# Patient Record
Sex: Male | Born: 1945
Health system: Southern US, Community
[De-identification: ages and names within clinical notes are randomized; demographics above are authoritative.]

## PROBLEM LIST (undated history)

## (undated) ENCOUNTER — Emergency Department (HOSPITAL_BASED_OUTPATIENT_CLINIC_OR_DEPARTMENT_OTHER): Admission: EM | Payer: Self-pay | Source: Home / Self Care

## (undated) DIAGNOSIS — N419 Inflammatory disease of prostate, unspecified: Secondary | ICD-10-CM

## (undated) DIAGNOSIS — C61 Malignant neoplasm of prostate: Secondary | ICD-10-CM

## (undated) DIAGNOSIS — Z934 Other artificial openings of gastrointestinal tract status: Secondary | ICD-10-CM

## (undated) DIAGNOSIS — E119 Type 2 diabetes mellitus without complications: Secondary | ICD-10-CM

## (undated) DIAGNOSIS — R251 Tremor, unspecified: Secondary | ICD-10-CM

## (undated) DIAGNOSIS — J189 Pneumonia, unspecified organism: Secondary | ICD-10-CM

## (undated) DIAGNOSIS — Z87442 Personal history of urinary calculi: Secondary | ICD-10-CM

## (undated) DIAGNOSIS — E859 Amyloidosis, unspecified: Secondary | ICD-10-CM

## (undated) DIAGNOSIS — H9190 Unspecified hearing loss, unspecified ear: Secondary | ICD-10-CM

## (undated) DIAGNOSIS — N200 Calculus of kidney: Secondary | ICD-10-CM

## (undated) DIAGNOSIS — K219 Gastro-esophageal reflux disease without esophagitis: Secondary | ICD-10-CM

## (undated) DIAGNOSIS — C4490 Unspecified malignant neoplasm of skin, unspecified: Secondary | ICD-10-CM

## (undated) DIAGNOSIS — E785 Hyperlipidemia, unspecified: Secondary | ICD-10-CM

## (undated) DIAGNOSIS — K8689 Other specified diseases of pancreas: Secondary | ICD-10-CM

## (undated) DIAGNOSIS — C9000 Multiple myeloma not having achieved remission: Secondary | ICD-10-CM

## (undated) HISTORY — PX: APPENDECTOMY: SHX54

## (undated) HISTORY — DX: Amyloidosis, unspecified: E85.9

## (undated) HISTORY — DX: Unspecified hearing loss, unspecified ear: H91.90

## (undated) HISTORY — DX: Other specified diseases of pancreas: K86.89

## (undated) HISTORY — DX: Calculus of kidney: N20.0

## (undated) HISTORY — PX: ROTATOR CUFF REPAIR: SHX139

## (undated) HISTORY — DX: Hyperlipidemia, unspecified: E78.5

## (undated) HISTORY — DX: Inflammatory disease of prostate, unspecified: N41.9

## (undated) HISTORY — DX: Multiple myeloma not having achieved remission: C90.00

## (undated) HISTORY — PX: HERNIA REPAIR: SHX51

---

## 2000-04-07 ENCOUNTER — Ambulatory Visit (HOSPITAL_COMMUNITY): Admission: RE | Admit: 2000-04-07 | Discharge: 2000-04-07 | Payer: Self-pay | Admitting: *Deleted

## 2000-04-07 HISTORY — PX: COLONOSCOPY: SHX174

## 2001-03-15 HISTORY — PX: ELBOW SURGERY: SHX618

## 2001-03-30 ENCOUNTER — Encounter (INDEPENDENT_AMBULATORY_CARE_PROVIDER_SITE_OTHER): Payer: Self-pay | Admitting: *Deleted

## 2001-03-30 ENCOUNTER — Ambulatory Visit (HOSPITAL_BASED_OUTPATIENT_CLINIC_OR_DEPARTMENT_OTHER): Admission: RE | Admit: 2001-03-30 | Discharge: 2001-03-30 | Payer: Self-pay | Admitting: Orthopedic Surgery

## 2003-07-13 ENCOUNTER — Emergency Department (HOSPITAL_COMMUNITY): Admission: EM | Admit: 2003-07-13 | Discharge: 2003-07-13 | Payer: Self-pay

## 2003-07-15 ENCOUNTER — Observation Stay (HOSPITAL_COMMUNITY): Admission: RE | Admit: 2003-07-15 | Discharge: 2003-07-16 | Payer: Self-pay | Admitting: Urology

## 2008-01-03 ENCOUNTER — Encounter: Admission: RE | Admit: 2008-01-03 | Discharge: 2008-01-03 | Payer: Self-pay | Admitting: General Surgery

## 2010-02-27 ENCOUNTER — Ambulatory Visit: Payer: Self-pay | Admitting: Cardiology

## 2010-02-27 ENCOUNTER — Encounter
Admission: RE | Admit: 2010-02-27 | Discharge: 2010-02-27 | Payer: Self-pay | Source: Home / Self Care | Attending: Cardiology | Admitting: Cardiology

## 2010-07-17 ENCOUNTER — Other Ambulatory Visit: Payer: Self-pay | Admitting: *Deleted

## 2010-07-17 MED ORDER — ZOLPIDEM TARTRATE 5 MG PO TABS: ORAL_TABLET | ORAL | Status: DC

## 2010-07-17 NOTE — Telephone Encounter (Signed)
Refilled meds per fax request. Faxed back to pharmacy  

## 2010-07-31 NOTE — Op Note (Signed)
NAME:  SPENSER, HARREN                     ACCOUNT NO.:  [id number]   MEDICAL RECORD NO.:  [id number]                   PATIENT TYPE:  OBV   LOCATION:  0358                                 FACILITY:  [facility]   PHYSICIAN:  Excell Seltzer. Annabell Howells, M.D.                 DATE OF BIRTH:  09/21/1945   DATE OF PROCEDURE:  07/15/2003  DATE OF DISCHARGE:                                 OPERATIVE REPORT   PROCEDURE:  Left ureteroscopic stent extraction with insertion of left  double-J stent.   PREOPERATIVE DIAGNOSIS:  Left ureterovesical junction stone.   POSTOPERATIVE DIAGNOSIS:  Left ureterovesical junction stone.   SURGEON:  Bjorn Pippin, M.D.   ANESTHESIA:  General anesthesia.   DRAINS:  A 6-French with 24-cm double-J stent.   SPECIMENS:  Stone.   COMPLICATIONS:  None.   INDICATIONS FOR PROCEDURE:  Mr. Meuth is a 65 year old white male who  presented to the ER for the second time this weekend with left flank pain  secondary to a 4-mm left distal ureteral stone.  After discussing the  options with the patient, it was elected to proceed with immediate  ureteroscopy.  The risks were explained in detail, including bleeding,  infection, and injury to the ureter, with possible open surgery, the need  for stent, as well as anesthetic complications.   FINDINGS AND PROCEDURE:  The patient was taken to the operating room where  general anesthetic was induced.  He was placed in the lithotomy position.  His perineum and genitalia were prepped with Betadine solution.  He was  draped in the usual sterile fashion.  A 6-French straight ureteroscope was  passed per urethra.  Examination revealed a normal urethra and intact  external sphincter.  The prostate appeared to have bilobar hyperplasia with  mild obstruction.  Examination of the bladder revealed trabeculation.  The  right ureteral orifice was unremarkable.  The left ureteral orifice was  identified and was unremarkable.  It was cannulated with  a standard  guidewire and the scope was advanced alongside the guidewire.  I was able to  advance the ureteroscope approximately 2 cm up the guidewire but met an  inflammatory-appearing stricture, likely secondary to the stone.  At this  point, the ureteroscope was removed.  A 4-cm 15-French balloon dilation  catheter was inserted over the wire across the area of stricturing.  The  balloon was dilated.  Initially, a waist did appear, but it was dilated by  12 atmospheres of pressure on the balloon.  The balloon was then removed,  leaving the wire in place.  The ureteroscope was then inserted alongside the  wire.  The stone was identified and grasped with a 3-French Nitinol basked  and removed without difficulty.  A 22-French cystoscope sheath was inserted  over the wire.  This was fitted with a 12-degree lens and bridge.  A 6-  French 24-cm double-J stent was then inserted to the  kidney under  fluoroscopic guidance without complications.  The wire was removed, leaving  good coil in the kidney and good coil in the bladder.  The bladder was then  more thoroughly inspected with a 70-degree lens and no bladder wall lesions  were identified.  The bladder was drained.  The  cystoscope was removed, leaving the stent string exiting the penis.  The  patient was taken down from the lithotomy position.  His anesthetic was  reversed and he was moved to the recovery room in stable condition.  He was  given Cipro 400 mg intravenously to cover the procedure.  There were no  complications.                                               Excell Seltzer. Annabell Howells, M.D.    JJW/MEDQ  D:  07/15/2003  T:  07/15/2003  Job:  [id number]

## 2010-07-31 NOTE — H&P (Signed)
NAME:  William Barron, William Barron                     ACCOUNT NO.:  [id number]   MEDICAL RECORD NO.:  [id number]                   PATIENT TYPE:  OBV   LOCATION:  0358                                 FACILITY:  [facility]   PHYSICIAN:  Excell Seltzer. Annabell Howells, M.D.                 DATE OF BIRTH:  11/24/1945   DATE OF ADMISSION:  07/15/2003  DATE OF DISCHARGE:                                HISTORY & PHYSICAL   CHIEF COMPLAINT:  Left flank pain.   HISTORY:  Mr. William Barron is a 65 year old white male who had the onset  Saturday morning of left flank pain.  The pain was severe enough that he  went to the emergency room where he was given IV analgesics.  A CT scan  revealed a 4 mm distal ureteral stone.  His pain abated, he went home, and  celebrated his birthday with a party at his house, and later that night  began to have recurrence of the left flank pain which once again became  severe enough to bring him to the emergency room.  He is concerned about  recurrent pain, and after reviewing the CT scan, I felt that ureteroscopy  was indicated.  He was Care Linked from [facility] to [facility] for evaluation and treatment.   ALLERGIES:  PENICILLIN.   CURRENT MEDICATIONS:  1. Dilaudid for acute stomach pain.  2. Aspirin p.r.n.   PAST MEDICAL HISTORY:  Unremarkable.   PAST SURGICAL HISTORY:  Pertinent for a prior orchiectomy for an undescended  testicle on the right as a child, and elbow surgery on the right from a  tennis injury.   SOCIAL HISTORY:  Negative tobacco, but he does drink moderate alcohol.   FAMILY HISTORY:  Unremarkable.   REVIEW OF SYSTEMS:  He denies fever or chills.  He has had some nausea.  He  also has had some irritative voiding symptoms.  He denies hematuria.  He is  otherwise entirely without complaints with the exception of his pain.   PHYSICAL EXAMINATION:  VITAL SIGNS:  His blood pressure is 156/91, pulse 75,  respirations 20, temperature 99.4.  GENERAL:  This  is a well-developed, well-nourished white male in no acute  distress, though medicated.  Alert and oriented x3.  HEENT:  Normocephalic, atraumatic.  NECK:  Supple.  LUNGS:  Clear with normal effort.  HEART:  Regular rate and rhythm.  ABDOMEN:  Soft, flat, with left ______________tenderness.  No mass,  hepatosplenomegaly, or hernias are noted.  There is no inguinal adenopathy.  GENITOURINARY:  Unremarkable phallus and adequate meatus.  Scrotum is  unremarkable.  The right testicle and epididymis are absent.  The left  testicle is normal in size without obvious mass or epididymal abnormalities.  RECTAL:  Not indicated.  EXTREMITIES:  Full range of motion without edema.  NEUROLOGIC:  Grossly intact.  SKIN:  Warm and dry.   LABORATORY DATA:  I reviewed  the laboratory work, including urinalysis,  which showed some blood.  CBC which demonstrates a mild elevation of the  white count and chemistries.  Additionally, I looked at the CT scan.  He has  a left distal ureteral stone with hydronephrosis.   IMPRESSION:  Symptomatic left UVJ stone.   PLAN:  After discussion of treatment options, we have elected to proceed  with ureteroscopic stone extraction.  The risks of bleeding, infection,  ureteral injury, need for a stent, and possible anesthetic complications  were explained to the patient.  He will be given Cipro to cover the  procedure.                                               Excell Seltzer. Annabell Howells, M.D.    JJW/MEDQ  D:  07/15/2003  T:  07/15/2003  Job:  [id number]

## 2010-07-31 NOTE — Op Note (Signed)
[place]  Patient:    William Barron, William Barron Visit Number: [id number] MRN: [id number]          Service Type: DSU Location: [facility] Attending Physician:  Burnard Bunting Dictated by:   Cammy Copa, M.D. Proc. Date: 03/30/01 Admit Date:  03/30/2001                             Operative Report  PREOPERATIVE DIAGNOSIS:  Right tennis elbow.  POSTOPERATIVE DIAGNOSIS:  Right tennis elbow.  PROCEDURE:  Right tennis elbow release with partial epicondylectomy.  SURGEON:  Cammy Copa, M.D.  ANESTHESIA:  General endotracheal.  ESTIMATED BLOOD LOSS:  2 cc.  DRAINS:  None.  TOURNIQUET TIME:  54 minutes at 250 mmHg.  DESCRIPTION OF PROCEDURE:  The patient was brought to the operating room, where general endotracheal anesthesia was induced.  Preoperative IV antibiotics were administered.  The right elbow, arm, and hand were prepped with Duraprep solution and draped in a sterile manner.  [name] was used to cover the operative field.  The arm was elevated and exsanguinated with the Esmarch wrap, the tourniquet was inflated.  A 4 cm incision was made centered off of the anterior aspect of the epicondyle.  Skin and subcutaneous tissue were sharply divided.  Fascia overlying the common extensor origin was divided and developed as a layer for separate closure.  The common extensor origin was divided over the ECRB.  Using skin hook retractors, an area of degenerated tissue was identified.  This was carefully separated from the underlying capsule and overlying normal tendinous tissue.  All in all, a 5 mm x 1 cm strip of degenerative tendinous tissue was removed.  The lateral epicondyle was exposed in the region of this degenerative tissue.  Using an osteotome, the bone was feathered to create some bleeding.  This was touched up with several drill holes using the small drill bit.  The incision was thoroughly irrigated.  The joint capsule was not  violated.  The common extensor origin was then repaired using 3-0 Vicryl suture.  The fascia overlying the common extensor origin was also closed using 3-0 Vicryl suture.  Skin was closed using interrupted 3-0 Vicryl suture followed by a running 3-0 pull-out Prolene.  The patients tourniquet was released after 54 minutes.  The patient tolerated the procedure well without immediate complication.  He was placed into a short-arm splint and a sling. Dictated by:   Cammy Copa, M.D. Attending Physician:  Burnard Bunting DD:  03/30/01 TD:  03/31/01 Job: [id number]

## 2010-09-10 ENCOUNTER — Encounter: Payer: Self-pay | Admitting: Cardiology

## 2010-09-21 ENCOUNTER — Encounter: Payer: Self-pay | Admitting: Cardiology

## 2010-09-21 ENCOUNTER — Ambulatory Visit (INDEPENDENT_AMBULATORY_CARE_PROVIDER_SITE_OTHER): Payer: Medicare Other | Admitting: Cardiology

## 2010-09-21 DIAGNOSIS — R413 Other amnesia: Secondary | ICD-10-CM

## 2010-09-21 DIAGNOSIS — R0989 Other specified symptoms and signs involving the circulatory and respiratory systems: Secondary | ICD-10-CM | POA: Insufficient documentation

## 2010-09-21 DIAGNOSIS — I10 Essential (primary) hypertension: Secondary | ICD-10-CM

## 2010-09-21 NOTE — Assessment & Plan Note (Signed)
The patient complains of difficulty with his memory.  He has had a very stressful year with the loss of his son occurring 13 months ago.  The patient feels that his memory is declining and he would like to have an evaluation of this.  He is a Environmental education officer.

## 2010-09-21 NOTE — Assessment & Plan Note (Signed)
This pleasant 65 year old man has a history of labile hypertension.  Number last saw him on 02/27/10 his blood pressure was 148/80.  We did not start him on medication but advised him to cut back on salt intake which she has done.  Previously he had been eating a lot of salted peanuts each night.  He has been able to lose weight.  He is not exercising on a regular basis yet.

## 2010-09-21 NOTE — Progress Notes (Signed)
William Barron Date of Birth:  15-Jun-1945 [facility] [address]   [address] [place], [place]  [address] [phone]           Fax   [phone]  HPI: This pleasant 65 year old gentleman is seen for a six-month followup office visit.  He has a history of labile hypertension.  He is not on any present medication.  His blood pressure has improved since he cut back on dietary salt intake.  He has a history of low testosterone and is taking AndroGel on a regular basis.  He does not have any history of known ischemic heart disease.  He had a normal nuclear stress test 10/04/06.  Recently he's been concerned about her decline in his memory.  He does acknowledge that the past year has been a very stressful year since his son died just 13 months ago  Current Outpatient Prescriptions  Medication Sig Dispense Refill  . fluconazole (DIFLUCAN) 200 MG tablet Take 200 mg by mouth daily. Take as directed       . Tamsulosin HCl (FLOMAX) 0.4 MG CAPS Take 0.4 mg by mouth daily.        . Testosterone (ANDROGEL PUMP TD) Place onto the skin. daily       . zolpidem (AMBIEN) 5 MG tablet 1 to 2 at bedtime as needed  60 tablet  5  . DISCONTD: aspirin 81 MG tablet Take 81 mg by mouth daily.          Allergies  Allergen Reactions  . Penicillins Swelling and Rash    Patient Active Problem List  Diagnoses  . Memory disorder  . Labile hypertension    History  Smoking status  . Former Smoker  . Quit date: 03/15/1977  Smokeless tobacco  . Not on file    History  Alcohol Use No    Family History  Problem Relation Age of Onset  . Cancer Father     pancreatic  . Parkinsonism Mother     Review of Systems: The patient denies any heat or cold intolerance.  No weight gain or weight loss.  The patient denies headaches or blurry vision.  There is no cough or sputum production.  The patient denies dizziness.  There is no hematuria or hematochezia.  The patient denies any  muscle aches or arthritis.  The patient denies any rash.  The patient denies frequent falling or instability.  There is no history of depression or anxiety.  All other systems were reviewed and are negative.   Physical Exam: Filed Vitals:   09/21/10 1417  BP: 128/76  The general appearance feels a well-developed well-nourished gentleman in no distress.The head and neck exam reveals pupils equal and reactive.  Extraocular movements are full.  There is no scleral icterus.  The mouth and pharynx are normal.  The neck is supple.  The carotids reveal no bruits.  The jugular venous pressure is normal.  The  thyroid is not enlarged.  There is no lymphadenopathy.  The chest is clear to percussion and auscultation.  There are no rales or rhonchi.  Expansion of the chest is symmetrical.  The precordium is quiet.  The first heart sound is normal.  The second heart sound is physiologically split.  There is no murmur gallop rub or click.  There is no abnormal lift or heave.  The abdomen is soft and nontender.  The bowel sounds are normal.  The liver and spleen are not enlarged.  There are no abdominal masses.  There are no abdominal bruits.  Extremities reveal good pedal pulses.  There is no phlebitis or edema.  There is no cyanosis or clubbing.  Strength is normal and symmetrical in all extremities.  There is no lateralizing weakness.  There are no sensory deficits.  The skin is warm and dry.  There is no rash.    Assessment / Plan: The patient's blood pressure has normalized with dietary salt restriction.  He does need to get back to regular aerobic exercise program and to continue to lose weight.  He had stopped taking his baby aspirin daily and I wanted to restart that.  We will arrange for neurology evaluation for questionable diminishing memory with [facility] neurology.Recheck here in 6 months for followup office visit and fasting lab work

## 2011-03-22 DIAGNOSIS — E291 Testicular hypofunction: Secondary | ICD-10-CM | POA: Diagnosis not present

## 2011-03-22 DIAGNOSIS — N400 Enlarged prostate without lower urinary tract symptoms: Secondary | ICD-10-CM | POA: Diagnosis not present

## 2011-03-22 DIAGNOSIS — N529 Male erectile dysfunction, unspecified: Secondary | ICD-10-CM | POA: Diagnosis not present

## 2011-03-29 ENCOUNTER — Encounter: Payer: Self-pay | Admitting: Cardiology

## 2011-03-29 ENCOUNTER — Ambulatory Visit (INDEPENDENT_AMBULATORY_CARE_PROVIDER_SITE_OTHER): Payer: Medicare Other | Admitting: Cardiology

## 2011-03-29 VITALS — BP 126/82 | HR 66 | Ht 66.0 in | Wt 164.0 lb

## 2011-03-29 DIAGNOSIS — E7800 Pure hypercholesterolemia, unspecified: Secondary | ICD-10-CM

## 2011-03-29 DIAGNOSIS — I10 Essential (primary) hypertension: Secondary | ICD-10-CM

## 2011-03-29 DIAGNOSIS — R0989 Other specified symptoms and signs involving the circulatory and respiratory systems: Secondary | ICD-10-CM

## 2011-03-29 LAB — BASIC METABOLIC PANEL
BUN: 17 mg/dL (ref 6–23)
CO2: 26 mEq/L (ref 19–32)
Calcium: 8.9 mg/dL (ref 8.4–10.5)
Chloride: 101 mEq/L (ref 96–112)
Creatinine, Ser: 1 mg/dL (ref 0.4–1.5)
GFR: 77.26 mL/min (ref >60.00)
Glucose, Bld: 91 mg/dL (ref 70–99)
Potassium: 3.6 mEq/L (ref 3.5–5.1)
Sodium: 141 mEq/L (ref 135–145)

## 2011-03-29 LAB — HEPATIC FUNCTION PANEL
ALT: 16 U/L (ref 0–53)
AST: 22 U/L (ref 0–37)
Albumin: 4 g/dL (ref 3.5–5.2)
Alkaline Phosphatase: 70 U/L (ref 39–117)
Bilirubin, Direct: 0.1 mg/dL (ref 0.0–0.3)
Total Bilirubin: 1 mg/dL (ref 0.3–1.2)
Total Protein: 7.2 g/dL (ref 6.0–8.3)

## 2011-03-29 LAB — LIPID PANEL
Cholesterol: 189 mg/dL (ref 0–200)
HDL: 45.3 mg/dL (ref >39.00)
LDL Cholesterol: 126 mg/dL — ABNORMAL HIGH (ref 0–99)
Total CHOL/HDL Ratio: 4
Triglycerides: 87 mg/dL (ref 0.0–149.0)
VLDL: 17.4 mg/dL (ref 0.0–40.0)

## 2011-03-29 NOTE — Assessment & Plan Note (Signed)
The patient denies any chest pain or shortness of breath.  He does get moderate exercise.  He does play occasional round of golf.  He walks in the neighborhood.  He intends to get started on a more vigorous exercise program.

## 2011-03-29 NOTE — Progress Notes (Signed)
William Barron Date of Birth:  [birth year] [facility] [address] [place], [place]  [address] [phone]  Fax   [phone]  HPI: This pleasant 66 year old gentleman is seen for a six-month followup office visit.  He's had a past history of labile hypertension.  He is watching his dietary salt.  He is not presently on any blood pressure medication.  He has a past history of high cholesterol.  He does not have ischemic heart disease and he had a normal nuclear stress test in 10/04/06.  He recently had a melanoma stage I removed from his back by Dr. Irene Limbo.  Current Outpatient Prescriptions  Medication Sig Dispense Refill  . fluconazole (DIFLUCAN) 200 MG tablet Take 200 mg by mouth daily. Take as directed      . Tamsulosin HCl (FLOMAX) 0.4 MG CAPS Take 0.4 mg by mouth daily.        . Testosterone (ANDROGEL PUMP TD) Place onto the skin. daily       . zolpidem (AMBIEN) 5 MG tablet 1 to 2 at bedtime as needed  60 tablet  5    Allergies  Allergen Reactions  . Penicillins Swelling and Rash    Patient Active Problem List  Diagnoses  . Memory disorder  . Labile hypertension  . Pure hypercholesterolemia    History  Smoking status  . Former Smoker  . Quit date: 03/15/1977  Smokeless tobacco  . Not on file    History  Alcohol Use No    Family History  Problem Relation Age of Onset  . Cancer Father     pancreatic  . Parkinsonism Mother     Review of Systems: The patient denies any heat or cold intolerance.  No weight gain or weight loss.  The patient denies headaches or blurry vision.  There is no cough or sputum production.  The patient denies dizziness.  There is no hematuria or hematochezia.  The patient denies any muscle aches or arthritis.  The patient denies any rash.  The patient denies frequent falling or instability.  There is no history of depression or anxiety.  All other systems were reviewed and are negative.   Physical  Exam: Filed Vitals:   03/29/11 1406  BP: 126/82  Pulse: 66   the general appearance reveals a well-developed well-nourished gentleman in no distress.The head and neck exam reveals pupils equal and reactive.  Extraocular movements are full.  There is no scleral icterus.  The mouth and pharynx are normal.  The neck is supple.  The carotids reveal no bruits.  The jugular venous pressure is normal.  The  thyroid is not enlarged.  There is no lymphadenopathy.  The chest is clear to percussion and auscultation.  There are no rales or rhonchi.  Expansion of the chest is symmetrical.  The precordium is quiet.  The first heart sound is normal.  The second heart sound is physiologically split.  There is no murmur gallop rub or click.  There is no abnormal lift or heave.  The abdomen is soft and nontender.  The bowel sounds are normal.  The liver and spleen are not enlarged.  There are no abdominal masses.  There are no abdominal bruits.  Extremities reveal good pedal pulses.  There is no phlebitis or edema.  There is no cyanosis or clubbing.  Strength is normal and symmetrical in all extremities.  There is no lateralizing weakness.  There are no sensory deficits.  The skin is warm and  dry.  There is no rash.      Assessment / Plan:  Continue present medication.  Blood work being drawn today.  Recheck in 6 months for office visit and EKG and fasting lab work.  Work harder on regular aerobic exercise program.

## 2011-03-29 NOTE — Assessment & Plan Note (Signed)
The patient has gained 2 pounds since last visit.  History and watch his diet in terms of cholesterol intake.  We are checking blood work today.

## 2011-03-29 NOTE — Patient Instructions (Signed)
Will obtain labs today and call you with the results (lp/bmet/hfp) Your physician recommends that you continue on your current medications as directed. Please refer to the Current Medication list given to you today. Your physician wants you to follow-up in: 6 months You will receive a reminder letter in the mail two months in advance. If you don't receive a letter, please call our office to schedule the follow-up appointment.     

## 2011-03-31 ENCOUNTER — Telehealth: Payer: Self-pay | Admitting: Cardiology

## 2011-03-31 ENCOUNTER — Telehealth: Payer: Self-pay | Admitting: *Deleted

## 2011-03-31 NOTE — Telephone Encounter (Signed)
Message copied by Burnell Blanks on Wed Mar 31, 2011  5:38 PM ------      Message from: Cassell Clement      Created: Mon Mar 29, 2011  7:33 PM       Please report.  The cholesterol is 189 which is okay but the LDL cholesterol is high at 126.  The liver and kidney tests are normal.  Work harder on careful low-cholesterol diet and more regular aerobic exercise and weight loss.

## 2011-03-31 NOTE — Telephone Encounter (Signed)
Called cell number, wrong number.  Called home number and left message mailing copy, call in any questions

## 2011-03-31 NOTE — Telephone Encounter (Signed)
Patient returned call and tried to call back.  Cell number wrong, left message at home.  Mailed copy to call if any questions

## 2011-03-31 NOTE — Telephone Encounter (Signed)
New Problem   Patient returning nurse MP phone call, please contact patient on mobile#

## 2011-06-15 DIAGNOSIS — H6690 Otitis media, unspecified, unspecified ear: Secondary | ICD-10-CM | POA: Diagnosis not present

## 2011-06-15 DIAGNOSIS — H65199 Other acute nonsuppurative otitis media, unspecified ear: Secondary | ICD-10-CM | POA: Diagnosis not present

## 2011-06-15 DIAGNOSIS — H6120 Impacted cerumen, unspecified ear: Secondary | ICD-10-CM | POA: Diagnosis not present

## 2011-06-15 DIAGNOSIS — R030 Elevated blood-pressure reading, without diagnosis of hypertension: Secondary | ICD-10-CM | POA: Diagnosis not present

## 2011-06-29 DIAGNOSIS — D485 Neoplasm of uncertain behavior of skin: Secondary | ICD-10-CM | POA: Diagnosis not present

## 2011-06-29 DIAGNOSIS — Z85820 Personal history of malignant melanoma of skin: Secondary | ICD-10-CM | POA: Diagnosis not present

## 2011-06-29 DIAGNOSIS — L851 Acquired keratosis [keratoderma] palmaris et plantaris: Secondary | ICD-10-CM | POA: Diagnosis not present

## 2011-06-29 DIAGNOSIS — L821 Other seborrheic keratosis: Secondary | ICD-10-CM | POA: Diagnosis not present

## 2011-06-29 DIAGNOSIS — D239 Other benign neoplasm of skin, unspecified: Secondary | ICD-10-CM | POA: Diagnosis not present

## 2011-07-09 DIAGNOSIS — H6120 Impacted cerumen, unspecified ear: Secondary | ICD-10-CM | POA: Diagnosis not present

## 2011-07-09 DIAGNOSIS — H903 Sensorineural hearing loss, bilateral: Secondary | ICD-10-CM | POA: Diagnosis not present

## 2011-09-22 DIAGNOSIS — E291 Testicular hypofunction: Secondary | ICD-10-CM | POA: Diagnosis not present

## 2011-09-22 DIAGNOSIS — N400 Enlarged prostate without lower urinary tract symptoms: Secondary | ICD-10-CM | POA: Diagnosis not present

## 2011-09-29 DIAGNOSIS — N400 Enlarged prostate without lower urinary tract symptoms: Secondary | ICD-10-CM | POA: Diagnosis not present

## 2011-09-29 DIAGNOSIS — N529 Male erectile dysfunction, unspecified: Secondary | ICD-10-CM | POA: Diagnosis not present

## 2011-09-29 DIAGNOSIS — E291 Testicular hypofunction: Secondary | ICD-10-CM | POA: Diagnosis not present

## 2011-09-29 DIAGNOSIS — N411 Chronic prostatitis: Secondary | ICD-10-CM | POA: Diagnosis not present

## 2011-10-07 ENCOUNTER — Encounter: Payer: Self-pay | Admitting: Cardiology

## 2011-11-10 DIAGNOSIS — N400 Enlarged prostate without lower urinary tract symptoms: Secondary | ICD-10-CM | POA: Diagnosis not present

## 2011-11-10 DIAGNOSIS — R351 Nocturia: Secondary | ICD-10-CM | POA: Diagnosis not present

## 2011-12-09 ENCOUNTER — Other Ambulatory Visit: Payer: Self-pay | Admitting: Cardiology

## 2011-12-10 DIAGNOSIS — M25579 Pain in unspecified ankle and joints of unspecified foot: Secondary | ICD-10-CM | POA: Diagnosis not present

## 2011-12-10 DIAGNOSIS — M65979 Unspecified synovitis and tenosynovitis, unspecified ankle and foot: Secondary | ICD-10-CM | POA: Diagnosis not present

## 2011-12-10 DIAGNOSIS — M659 Synovitis and tenosynovitis, unspecified: Secondary | ICD-10-CM | POA: Diagnosis not present

## 2011-12-14 ENCOUNTER — Other Ambulatory Visit: Payer: Self-pay | Admitting: Cardiology

## 2011-12-14 DIAGNOSIS — G4700 Insomnia, unspecified: Secondary | ICD-10-CM

## 2011-12-14 MED ORDER — ZOLPIDEM TARTRATE 5 MG PO TABS: ORAL_TABLET | ORAL | Status: DC

## 2011-12-30 DIAGNOSIS — D1801 Hemangioma of skin and subcutaneous tissue: Secondary | ICD-10-CM | POA: Diagnosis not present

## 2011-12-30 DIAGNOSIS — L821 Other seborrheic keratosis: Secondary | ICD-10-CM | POA: Diagnosis not present

## 2011-12-30 DIAGNOSIS — Z85820 Personal history of malignant melanoma of skin: Secondary | ICD-10-CM | POA: Diagnosis not present

## 2011-12-30 DIAGNOSIS — L678 Other hair color and hair shaft abnormalities: Secondary | ICD-10-CM | POA: Diagnosis not present

## 2011-12-30 DIAGNOSIS — D239 Other benign neoplasm of skin, unspecified: Secondary | ICD-10-CM | POA: Diagnosis not present

## 2011-12-30 DIAGNOSIS — L738 Other specified follicular disorders: Secondary | ICD-10-CM | POA: Diagnosis not present

## 2012-03-22 DIAGNOSIS — Z23 Encounter for immunization: Secondary | ICD-10-CM | POA: Diagnosis not present

## 2012-07-03 DIAGNOSIS — N529 Male erectile dysfunction, unspecified: Secondary | ICD-10-CM | POA: Diagnosis not present

## 2012-07-03 DIAGNOSIS — N400 Enlarged prostate without lower urinary tract symptoms: Secondary | ICD-10-CM | POA: Diagnosis not present

## 2012-07-03 DIAGNOSIS — E291 Testicular hypofunction: Secondary | ICD-10-CM | POA: Diagnosis not present

## 2012-07-28 ENCOUNTER — Ambulatory Visit (INDEPENDENT_AMBULATORY_CARE_PROVIDER_SITE_OTHER): Payer: Medicare Other | Admitting: Emergency Medicine

## 2012-07-28 ENCOUNTER — Ambulatory Visit: Payer: Medicare Other

## 2012-07-28 VITALS — BP 104/60 | HR 86 | Temp 98.4°F | Resp 16 | Ht 64.0 in | Wt 164.0 lb

## 2012-07-28 DIAGNOSIS — R05 Cough: Secondary | ICD-10-CM | POA: Diagnosis not present

## 2012-07-28 DIAGNOSIS — J309 Allergic rhinitis, unspecified: Secondary | ICD-10-CM

## 2012-07-28 DIAGNOSIS — R059 Cough, unspecified: Secondary | ICD-10-CM

## 2012-07-28 DIAGNOSIS — R51 Headache: Secondary | ICD-10-CM | POA: Diagnosis not present

## 2012-07-28 DIAGNOSIS — J189 Pneumonia, unspecified organism: Secondary | ICD-10-CM

## 2012-07-28 LAB — POCT CBC
Granulocyte percent: 74.4 %G (ref 37–80)
HCT, POC: 48.9 % (ref 43.5–53.7)
Hemoglobin: 15.5 g/dL (ref 14.1–18.1)
Lymph, poc: 2 (ref 0.6–3.4)
MCH, POC: 31.3 pg — AB (ref 27–31.2)
MCHC: 31.7 g/dL — AB (ref 31.8–35.4)
MCV: 98.6 fL — AB (ref 80–97)
MID (cbc): 0.6 (ref 0–0.9)
MPV: 8.8 fL (ref 0–99.8)
POC Granulocyte: 7.4 — AB (ref 2–6.9)
POC LYMPH PERCENT: 19.7 %L (ref 10–50)
POC MID %: 5.9 %M (ref 0–12)
Platelet Count, POC: 295 10*3/uL (ref 142–424)
RBC: 4.96 M/uL (ref 4.69–6.13)
RDW, POC: 15.3 %
WBC: 9.9 10*3/uL (ref 4.6–10.2)

## 2012-07-28 MED ORDER — LEVOFLOXACIN 500 MG PO TABS: 500.0000 mg | ORAL_TABLET | Freq: Every day | ORAL | Status: AC

## 2012-07-28 MED ORDER — FLUTICASONE PROPIONATE 50 MCG/ACT NA SUSP: 2.0000 | Freq: Every day | NASAL | Status: DC

## 2012-07-28 NOTE — Progress Notes (Signed)
  Subjective:    Patient ID: William Barron, male    DOB: 1945-08-08, 67 y.o.   MRN: [id number]  HPI [name] with 3 week history of head congestion sinus congestion cough. Patient is a former smoker but stopped smoking about 30 years ago. He has not been coughing out much phlegm..    Review of Systems     Objective:   Physical Exam TMs are clear nose is congested throat is normal chest exam reveals a few rhonchi in the bases. Results for orders placed in visit on 07/28/12  POCT CBC      Result Value Range   WBC 9.9  4.6 - 10.2 K/uL   Lymph, poc 2.0  0.6 - 3.4   POC LYMPH PERCENT 19.7  10 - 50 %L   MID (cbc) 0.6  0 - 0.9   POC MID % 5.9  0 - 12 %M   POC Granulocyte 7.4 (*) 2 - 6.9   Granulocyte percent 74.4  37 - 80 %G   RBC 4.96  4.69 - 6.13 M/uL   Hemoglobin 15.5  14.1 - 18.1 g/dL   HCT, POC 40.9  81.1 - 53.7 %   MCV 98.6 (*) 80 - 97 fL   MCH, POC 31.3 (*) 27 - 31.2 pg   MCHC 31.7 (*) 31.8 - 35.4 g/dL   RDW, POC 91.4     Platelet Count, POC 295  142 - 424 K/uL   MPV 8.8  0 - 99.8 fL   [facility] reading (PRIMARY) by  Dr. Cleta Alberts  There appears tobe a patchy infiltrate over the left diaphragm       Assessment & Plan:   Will treat with Levaquin , mucinex,  and Flonase. Repeat chest x-ray in 2 weeks

## 2012-07-28 NOTE — Patient Instructions (Signed)
Please return to clinic in 2 weeks for repeat chest x-ray. Take antibiotics as instructed.pneumoniaPneumonia, Adult Pneumonia is an infection of the lungs.  CAUSES Pneumonia may be caused by bacteria or a virus. Usually, these infections are caused by breathing infectious particles into the lungs (respiratory tract). SYMPTOMS   Cough.  Fever.  Chest pain.  Increased rate of breathing.  Wheezing.  Mucus production. DIAGNOSIS  If you have the common symptoms of pneumonia, your caregiver will typically confirm the diagnosis with a chest X-ray. The X-ray will show an abnormality in the lung (pulmonary infiltrate) if you have pneumonia. Other tests of your blood, urine, or sputum may be done to find the specific cause of your pneumonia. Your caregiver may also do tests (blood gases or pulse oximetry) to see how well your lungs are working. TREATMENT  Some forms of pneumonia may be spread to other people when you cough or sneeze. You may be asked to wear a mask before and during your exam. Pneumonia that is caused by bacteria is treated with antibiotic medicine. Pneumonia that is caused by the influenza virus may be treated with an antiviral medicine. Most other viral infections must run their course. These infections will not respond to antibiotics.  PREVENTION A pneumococcal shot (vaccine) is available to prevent a common bacterial cause of pneumonia. This is usually suggested for:  People over 79 years old.  Patients on chemotherapy.  People with chronic lung problems, such as bronchitis or emphysema.  People with immune system problems. If you are over 65 or have a high risk condition, you may receive the pneumococcal vaccine if you have not received it before. In some countries, a routine influenza vaccine is also recommended. This vaccine can help prevent some cases of pneumonia.You may be offered the influenza vaccine as part of your care. If you smoke, it is time to quit. You may  receive instructions on how to stop smoking. Your caregiver can provide medicines and counseling to help you quit. HOME CARE INSTRUCTIONS   Cough suppressants may be used if you are losing too much rest. However, coughing protects you by clearing your lungs. You should avoid using cough suppressants if you can.  Your caregiver may have prescribed medicine if he or she thinks your pneumonia is caused by a bacteria or influenza. Finish your medicine even if you start to feel better.  Your caregiver may also prescribe an expectorant. This loosens the mucus to be coughed up.  Only take over-the-counter or prescription medicines for pain, discomfort, or fever as directed by your caregiver.  Do not smoke. Smoking is a common cause of bronchitis and can contribute to pneumonia. If you are a smoker and continue to smoke, your cough may last several weeks after your pneumonia has cleared.  A cold steam vaporizer or humidifier in your room or home may help loosen mucus.  Coughing is often worse at night. Sleeping in a semi-upright position in a recliner or using a couple pillows under your head will help with this.  Get rest as you feel it is needed. Your body will usually let you know when you need to rest. SEEK IMMEDIATE MEDICAL CARE IF:   Your illness becomes worse. This is especially true if you are elderly or weakened from any other disease.  You cannot control your cough with suppressants and are losing sleep.  You begin coughing up blood.  You develop pain which is getting worse or is uncontrolled with medicines.  You have  a fever.  Any of the symptoms which initially brought you in for treatment are getting worse rather than better.  You develop shortness of breath or chest pain. MAKE SURE YOU:   Understand these instructions.  Will watch your condition.  Will get help right away if you are not doing well or get worse. Document Released: 03/01/2005 Document Revised: 05/24/2011  Document Reviewed: 05/21/2010 [facility] Patient Information 2013 [place], [place].

## 2012-08-10 ENCOUNTER — Ambulatory Visit: Payer: Medicare Other

## 2012-08-10 ENCOUNTER — Ambulatory Visit (INDEPENDENT_AMBULATORY_CARE_PROVIDER_SITE_OTHER): Payer: Medicare Other | Admitting: Emergency Medicine

## 2012-08-10 VITALS — BP 118/80 | HR 82 | Temp 97.9°F | Resp 18 | Ht 65.5 in | Wt 163.0 lb

## 2012-08-10 DIAGNOSIS — J189 Pneumonia, unspecified organism: Secondary | ICD-10-CM | POA: Diagnosis not present

## 2012-08-10 NOTE — Progress Notes (Signed)
  Subjective:    Patient ID: William Barron, male    DOB: 03/12/1946, 67 y.o.   MRN: [id number]  HPI Pt presents to clinic today for a follow up on his pnuemonia. Pt states he has not been coughing in about 3-4 days his chest x-ray showed a left lateral lung infiltrate versus atelectasis. He has completed his course of Levaquin..   Review of Systems     Objective:   Physical Exam HEENT exam is unremarkable. Neck is supple. Chest is clear. Heart regular rate no murmurs.  [facility] reading (PRIMARY) by  Dr. Cleta Alberts infiltrate left base is 90% resolved        Assessment & Plan:  Patient has a left lower lobe pneumonia which has resolved almost 90%. Would do one followup film in a month. He has not smoked for over 30 years

## 2012-08-28 ENCOUNTER — Encounter: Payer: Self-pay | Admitting: Radiology

## 2012-09-20 DIAGNOSIS — H903 Sensorineural hearing loss, bilateral: Secondary | ICD-10-CM | POA: Diagnosis not present

## 2012-09-20 DIAGNOSIS — H905 Unspecified sensorineural hearing loss: Secondary | ICD-10-CM | POA: Diagnosis not present

## 2012-12-28 DIAGNOSIS — D1801 Hemangioma of skin and subcutaneous tissue: Secondary | ICD-10-CM | POA: Diagnosis not present

## 2012-12-28 DIAGNOSIS — L819 Disorder of pigmentation, unspecified: Secondary | ICD-10-CM | POA: Diagnosis not present

## 2012-12-28 DIAGNOSIS — L821 Other seborrheic keratosis: Secondary | ICD-10-CM | POA: Diagnosis not present

## 2012-12-28 DIAGNOSIS — Z85820 Personal history of malignant melanoma of skin: Secondary | ICD-10-CM | POA: Diagnosis not present

## 2013-01-01 DIAGNOSIS — N400 Enlarged prostate without lower urinary tract symptoms: Secondary | ICD-10-CM | POA: Diagnosis not present

## 2013-01-01 DIAGNOSIS — E291 Testicular hypofunction: Secondary | ICD-10-CM | POA: Diagnosis not present

## 2013-01-08 DIAGNOSIS — K5520 Angiodysplasia of colon without hemorrhage: Secondary | ICD-10-CM | POA: Diagnosis not present

## 2013-01-08 DIAGNOSIS — K648 Other hemorrhoids: Secondary | ICD-10-CM | POA: Diagnosis not present

## 2013-01-08 DIAGNOSIS — D126 Benign neoplasm of colon, unspecified: Secondary | ICD-10-CM | POA: Diagnosis not present

## 2013-01-08 DIAGNOSIS — K620 Anal polyp: Secondary | ICD-10-CM | POA: Diagnosis not present

## 2013-01-08 DIAGNOSIS — Z86010 Personal history of colonic polyps: Secondary | ICD-10-CM | POA: Diagnosis not present

## 2013-01-08 DIAGNOSIS — K5730 Diverticulosis of large intestine without perforation or abscess without bleeding: Secondary | ICD-10-CM | POA: Diagnosis not present

## 2013-01-08 DIAGNOSIS — Z09 Encounter for follow-up examination after completed treatment for conditions other than malignant neoplasm: Secondary | ICD-10-CM | POA: Diagnosis not present

## 2013-01-08 DIAGNOSIS — D128 Benign neoplasm of rectum: Secondary | ICD-10-CM | POA: Diagnosis not present

## 2013-01-22 ENCOUNTER — Encounter (INDEPENDENT_AMBULATORY_CARE_PROVIDER_SITE_OTHER): Payer: Self-pay | Admitting: General Surgery

## 2013-01-22 ENCOUNTER — Ambulatory Visit (INDEPENDENT_AMBULATORY_CARE_PROVIDER_SITE_OTHER): Payer: Medicare Other | Admitting: General Surgery

## 2013-01-22 VITALS — BP 138/76 | HR 68 | Temp 98.9°F | Resp 14 | Ht 66.0 in | Wt 161.6 lb

## 2013-01-22 DIAGNOSIS — K644 Residual hemorrhoidal skin tags: Secondary | ICD-10-CM

## 2013-01-22 NOTE — Progress Notes (Signed)
Chief Complaint  Patient presents with  . New Evaluation    eval anal nodule    HISTORY: MASATO PETTIE is a 67 y.o. male who presents to the office with an anal nodule found on recent colonscopy.   His bowel habits are regular and his bowel movements are sometimes hard.  He has occasional straining.  His fiber intake is dietary.  His last colonoscopy was 10/27.  Several hyperplastic polyps were found along with an anal mass.  He denies any pain or bleeding.  No change in bowel habits.     Past Medical History  Diagnosis Date  . Hyperlipidemia   . Dyspnea   . Prostatitis     acute and chronic  . Nephrolithiasis   . Hypertension     systolic      Past Surgical History  Procedure Laterality Date  . Cystoscopy with ureteroscopy    . Lithotomy    . Orchiectomy      right  . Rotator cuff repair    . Appendectomy    . Elbow surgery  2003    right  . Colonoscopy  04/07/00  . Hernia repair          Current Outpatient Prescriptions  Medication Sig Dispense Refill  . fluconazole (DIFLUCAN) 200 MG tablet       . sildenafil (VIAGRA) 25 MG tablet Take 25 mg by mouth daily as needed for erectile dysfunction.      . Tamsulosin HCl (FLOMAX) 0.4 MG CAPS Take 0.4 mg by mouth daily.        . Testosterone (ANDROGEL PUMP TD) Place onto the skin. daily       . zolpidem (AMBIEN) 5 MG tablet 1 to 2 at bedtime as needed  60 tablet  0   No current facility-administered medications for this visit.      Allergies  Allergen Reactions  . Penicillins Swelling and Rash      Family History  Problem Relation Age of Onset  . Cancer Father     pancreatic  . Pancreatic cancer Father   . Parkinsonism Mother     History   Social History  . Marital Status: Married    Spouse Name: N/A    Number of Children: N/A  . Years of Education: N/A   Social History Main Topics  . Smoking status: Former Smoker    Quit date: 03/15/1977  . Smokeless tobacco: Never Used  . Alcohol Use: Yes  . Drug  Use: No  . Sexual Activity: Yes    Birth Control/ Protection: None   Other Topics Concern  . None   Social History Narrative  . None      REVIEW OF SYSTEMS - PERTINENT POSITIVES ONLY: Review of Systems - General ROS: negative for - chills, fever or weight loss Hematological and Lymphatic ROS: negative for - bleeding problems, blood clots or bruising Respiratory ROS: no cough, shortness of breath, or wheezing Cardiovascular ROS: no chest pain or dyspnea on exertion Gastrointestinal ROS: no abdominal pain, change in bowel habits, or black or bloody stools Genito-Urinary ROS: no dysuria, trouble voiding, or hematuria  EXAM: Filed Vitals:   01/22/13 1536  BP: 138/76  Pulse: 68  Temp: 98.9 F (37.2 C)  Resp: 14    General appearance: alert and cooperative Resp: clear to auscultation bilaterally Cardio: regular rate and rhythm GI: normal findings: soft, non-tender   Procedure: Anoscopy Surgeon: Maisie Fus Diagnosis: anal nodule  Assistant: Christella Scheuermann After the risks and benefits  were explained, verbal consent was obtained for above procedure  Anesthesia: none Findings: grade 2 internal hemorrhoids, small skin tag noted on LL internal hemorrhoid.  Moderate RA skin tag    ASSESSMENT AND PLAN: SID GREENER is a 67 y.o. M with a recent finding of an anal mass on colonoscopy.  Upon further review with anoscopy, this appears to be a small skin tag.  This appears completely benign.  I suggested that we leave it unless he has difficulty with anal irritation and/or bleeding.      Vanita Panda, MD Colon and Rectal Surgery / General Surgery [facility]      Visit Diagnoses: 1. Anal skin tag     Primary Care Physician: No PCP Per Patient

## 2013-01-22 NOTE — Patient Instructions (Signed)
You have an anal skin tag.  Call the office if you develop any anal irritation or bleeding.  Otherwise can just leave skin tag alone.

## 2013-01-24 ENCOUNTER — Encounter (INDEPENDENT_AMBULATORY_CARE_PROVIDER_SITE_OTHER): Payer: Self-pay

## 2013-02-09 ENCOUNTER — Ambulatory Visit (INDEPENDENT_AMBULATORY_CARE_PROVIDER_SITE_OTHER): Payer: Medicare Other | Admitting: Emergency Medicine

## 2013-02-09 VITALS — BP 110/82 | HR 74 | Temp 99.0°F | Resp 18 | Ht 65.5 in | Wt 165.8 lb

## 2013-02-09 DIAGNOSIS — R1900 Intra-abdominal and pelvic swelling, mass and lump, unspecified site: Secondary | ICD-10-CM

## 2013-02-09 DIAGNOSIS — R1909 Other intra-abdominal and pelvic swelling, mass and lump: Secondary | ICD-10-CM

## 2013-02-09 NOTE — Progress Notes (Signed)
[facility] [address] [place] [address] [phone]  Date:  02/09/2013   Name:  William Barron   DOB:  1945-09-24   MRN:  [id number]  PCP:  No PCP Per Patient    Chief Complaint: Mass   History of Present Illness:  William Barron is a 67 y.o. very pleasant male patient who presents with the following:  Noticed a mass in his left leg just distal to the inguinal crease yesterday in the shower.  No antecedent infection in the lower leg.  No fever or chills.  No improvement with over the counter medications or other home remedies. Denies other complaint or health concern today.   Patient Active Problem List   Diagnosis Date Noted  . Pure hypercholesterolemia 03/29/2011  . Memory disorder 09/21/2010  . Labile hypertension 09/21/2010    Past Medical History  Diagnosis Date  . Hyperlipidemia   . Dyspnea   . Prostatitis     acute and chronic  . Nephrolithiasis   . Hypertension     systolic    Past Surgical History  Procedure Laterality Date  . Cystoscopy with ureteroscopy    . Lithotomy    . Orchiectomy      right  . Rotator cuff repair    . Appendectomy    . Elbow surgery  2003    right  . Colonoscopy  04/07/00  . Hernia repair      History  Substance Use Topics  . Smoking status: Former Smoker    Quit date: 03/15/1977  . Smokeless tobacco: Never Used  . Alcohol Use: Yes    Family History  Problem Relation Age of Onset  . Cancer Father     pancreatic  . Pancreatic cancer Father   . Parkinsonism Mother     Allergies  Allergen Reactions  . Penicillins Swelling and Rash    Medication list has been reviewed and updated.  Current Outpatient Prescriptions on File Prior to Visit  Medication Sig Dispense Refill  . fluconazole (DIFLUCAN) 200 MG tablet       . sildenafil (VIAGRA) 25 MG tablet Take 25 mg by mouth daily as needed for erectile dysfunction.      . Tamsulosin HCl (FLOMAX) 0.4 MG CAPS Take 0.4 mg by mouth  daily.        . Testosterone (ANDROGEL PUMP TD) Place onto the skin. daily       . zolpidem (AMBIEN) 5 MG tablet 1 to 2 at bedtime as needed  60 tablet  0   No current facility-administered medications on file prior to visit.    Review of Systems:  As per HPI, otherwise negative.   Physical Examination: Filed Vitals:   02/09/13 1720  BP: 110/82  Pulse: 74  Temp: 99 F (37.2 C)  Resp: 18   Filed Vitals:   02/09/13 1720  Height: 5' 5.5" (1.664 m)  Weight: 165 lb 12.8 oz (75.206 kg)   Body mass index is 27.16 kg/(m^2). Ideal Body Weight: Weight in (lb) to have BMI = 25: 152.2   GEN: WDWN, NAD, Non-toxic, Alert & Oriented x 3 HEENT: Atraumatic, Normocephalic.  Ears and Nose: No external deformity. EXTR: No clubbing/cyanosis/edema NEURO: Normal gait.  PSYCH: Normally interactive. Conversant. Not depressed or anxious appearing.  Calm demeanor.  Genitalia absent testicle.  Male circumcised.  3 cm firm mass tethered to underlying tissue left proximal lower leg.  Free of skin.  Assessment and Plan: Left groin mass  Refer general surgery  Signed,  Phillips Odor, MD

## 2013-02-13 DIAGNOSIS — M25539 Pain in unspecified wrist: Secondary | ICD-10-CM | POA: Diagnosis not present

## 2013-02-14 ENCOUNTER — Ambulatory Visit (INDEPENDENT_AMBULATORY_CARE_PROVIDER_SITE_OTHER): Payer: Medicare Other | Admitting: General Surgery

## 2013-02-14 ENCOUNTER — Encounter (INDEPENDENT_AMBULATORY_CARE_PROVIDER_SITE_OTHER): Payer: Self-pay | Admitting: General Surgery

## 2013-02-14 VITALS — BP 126/80 | HR 67 | Temp 98.7°F | Resp 18 | Ht 66.0 in | Wt 162.0 lb

## 2013-02-14 DIAGNOSIS — R599 Enlarged lymph nodes, unspecified: Secondary | ICD-10-CM

## 2013-02-14 NOTE — Progress Notes (Signed)
Patient ID: William Barron, male   DOB: May 15, 1945, 67 y.o.   MRN: [id number]  Chief Complaint  Patient presents with  . Establish Care    mass groin    HPI William Barron is a 67 y.o. male.  We are asked to see the patient in consultation by Dr. Marice Potter to evaluate him for a mass in his left groin. The patient is a 67 year old white male who first noticed a lump in his left upper inner thigh about a week or 2 ago. He denies any pain in the area. He has not had any drainage from the area. He has not had any recent trauma or infections to that limb. He denies any nausea and vomiting. He is having normal bowel movements without any blood in his stool.  HPI  Past Medical History  Diagnosis Date  . Hyperlipidemia   . Dyspnea   . Prostatitis     acute and chronic  . Nephrolithiasis   . Hypertension     systolic    Past Surgical History  Procedure Laterality Date  . Cystoscopy with ureteroscopy    . Lithotomy    . Orchiectomy      right  . Rotator cuff repair    . Appendectomy    . Elbow surgery  2003    right  . Colonoscopy  04/07/00  . Hernia repair      Family History  Problem Relation Age of Onset  . Cancer Father     pancreatic  . Pancreatic cancer Father   . Parkinsonism Mother     Social History History  Substance Use Topics  . Smoking status: Former Smoker    Quit date: 03/15/1977  . Smokeless tobacco: Never Used  . Alcohol Use: Yes    Allergies  Allergen Reactions  . Penicillins Swelling and Rash    Current Outpatient Prescriptions  Medication Sig Dispense Refill  . sildenafil (VIAGRA) 25 MG tablet Take 25 mg by mouth daily as needed for erectile dysfunction.      . Tamsulosin HCl (FLOMAX) 0.4 MG CAPS Take 0.4 mg by mouth daily.        . Testosterone (ANDROGEL PUMP TD) Place onto the skin. daily       . zolpidem (AMBIEN) 5 MG tablet 1 to 2 at bedtime as needed  60 tablet  0  . fluconazole (DIFLUCAN) 200 MG tablet        No current  facility-administered medications for this visit.    Review of Systems Review of Systems  Constitutional: Negative.   HENT: Negative.   Eyes: Negative.   Respiratory: Negative.   Cardiovascular: Negative.   Gastrointestinal: Negative.   Endocrine: Negative.   Genitourinary: Negative.   Musculoskeletal: Negative.   Skin: Negative.   Allergic/Immunologic: Negative.   Neurological: Negative.   Hematological: Negative.   Psychiatric/Behavioral: Negative.     Blood pressure 126/80, pulse 67, temperature 98.7 F (37.1 C), resp. rate 18, height 5\' 6"  (1.676 m), weight 162 lb (73.483 kg).  Physical Exam Physical Exam  Constitutional: He is oriented to person, place, and time. He appears well-developed and well-nourished.  HENT:  Head: Normocephalic and atraumatic.  Eyes: Conjunctivae and EOM are normal. Pupils are equal, round, and reactive to light.  Neck: Normal range of motion. Neck supple.  Cardiovascular: Normal rate, regular rhythm and normal heart sounds.   Pulmonary/Chest: Effort normal and breath sounds normal.  Abdominal: Soft. Bowel sounds are normal. He exhibits no mass. There  is no tenderness.  Genitourinary:  There is a large palpable mobile lymph node in the left groin area. There is no palpable evidence for hernia either inguinal or femoral.  Musculoskeletal: Normal range of motion.  Lymphadenopathy:    He has no cervical adenopathy.  Neurological: He is alert and oriented to person, place, and time.  Skin: Skin is warm and dry.  Psychiatric: He has a normal mood and affect. His behavior is normal.    Data Reviewed As above  Assessment    The patient has a large abnormal lymph node that is palpable in the left groin. There is no recent infection or trauma to suggest that this is a reactionary lymph node. Because of this and because of the size of the lymph node I think it would be reasonable to excise the lymph node for biopsy.     Plan    I've discussed  with him in detail the risk and benefits of the operation and removed this lymph node from the left groin as well as some of the technical aspects and he understands and wishes to proceed        TOTH III,Jensyn Shave S 02/14/2013, 3:46 PM

## 2013-02-14 NOTE — Patient Instructions (Signed)
Plan for left groin lymph node biopsy

## 2013-02-19 ENCOUNTER — Telehealth (INDEPENDENT_AMBULATORY_CARE_PROVIDER_SITE_OTHER): Payer: Self-pay

## 2013-02-19 DIAGNOSIS — R1909 Other intra-abdominal and pelvic swelling, mass and lump: Secondary | ICD-10-CM

## 2013-02-19 NOTE — Telephone Encounter (Signed)
Pt is scheduled for lymph node bx of groin on 02/28/13 by Dr. Carolynne Edouard.  He is calling requesting his post op pain medication prescription prior to surgery.  He states personal family reasons for his request.  Dr. Carolynne Edouard was paged.  He agreed to write Rx tomorrow while in office seeing patients.  Our office will call the patient when Rx is ready.  Please call pt's mobile [phone].

## 2013-02-20 MED ORDER — OXYCODONE HCL 5 MG PO TABS: 5.0000 mg | ORAL_TABLET | ORAL | Status: DC | PRN

## 2013-02-20 NOTE — Telephone Encounter (Signed)
Called pt to notify him a written rx for Oxycodone 5mg  #50 for pt to p/u at the front desk.

## 2013-02-20 NOTE — Addendum Note (Signed)
Addended by: Ethlyn Gallery on: 02/20/2013 09:17 AM   Modules accepted: Orders

## 2013-02-26 DIAGNOSIS — R599 Enlarged lymph nodes, unspecified: Secondary | ICD-10-CM | POA: Diagnosis not present

## 2013-02-28 ENCOUNTER — Other Ambulatory Visit (INDEPENDENT_AMBULATORY_CARE_PROVIDER_SITE_OTHER): Payer: Self-pay | Admitting: General Surgery

## 2013-02-28 DIAGNOSIS — R569 Unspecified convulsions: Secondary | ICD-10-CM | POA: Diagnosis not present

## 2013-02-28 DIAGNOSIS — R599 Enlarged lymph nodes, unspecified: Secondary | ICD-10-CM | POA: Diagnosis not present

## 2013-02-28 DIAGNOSIS — E859 Amyloidosis, unspecified: Secondary | ICD-10-CM | POA: Diagnosis not present

## 2013-03-19 ENCOUNTER — Telehealth (INDEPENDENT_AMBULATORY_CARE_PROVIDER_SITE_OTHER): Payer: Self-pay

## 2013-03-26 ENCOUNTER — Ambulatory Visit (INDEPENDENT_AMBULATORY_CARE_PROVIDER_SITE_OTHER): Payer: Medicare Other | Admitting: General Surgery

## 2013-03-26 ENCOUNTER — Encounter (INDEPENDENT_AMBULATORY_CARE_PROVIDER_SITE_OTHER): Payer: Self-pay | Admitting: General Surgery

## 2013-03-26 VITALS — BP 138/80 | HR 70 | Temp 98.0°F | Resp 18 | Ht 66.0 in | Wt 167.6 lb

## 2013-03-26 DIAGNOSIS — N401 Enlarged prostate with lower urinary tract symptoms: Secondary | ICD-10-CM | POA: Diagnosis not present

## 2013-03-26 DIAGNOSIS — E859 Amyloidosis, unspecified: Secondary | ICD-10-CM | POA: Insufficient documentation

## 2013-03-26 DIAGNOSIS — N400 Enlarged prostate without lower urinary tract symptoms: Secondary | ICD-10-CM | POA: Diagnosis not present

## 2013-03-26 DIAGNOSIS — N138 Other obstructive and reflux uropathy: Secondary | ICD-10-CM | POA: Diagnosis not present

## 2013-03-26 DIAGNOSIS — E291 Testicular hypofunction: Secondary | ICD-10-CM | POA: Diagnosis not present

## 2013-03-26 DIAGNOSIS — N529 Male erectile dysfunction, unspecified: Secondary | ICD-10-CM | POA: Diagnosis not present

## 2013-03-26 NOTE — Progress Notes (Signed)
Subjective:     Patient ID: William Barron, male   DOB: [birth year], 68 y.o.   MRN: [id number]  HPI The patient is a 68 year old white male who is a couple weeks status post left groin lymph node biopsy. His pathology came back as amyloidosis. He denies any pain at the incision site. His appetite is good and his bowels are working normally.  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 From a surgical standpoint he can return to his normal activities without restriction. We will plan to see him back on a when necessary basis

## 2013-03-26 NOTE — Patient Instructions (Signed)
May return to normal activities 

## 2013-04-12 ENCOUNTER — Telehealth: Payer: Self-pay | Admitting: Oncology

## 2013-04-12 DIAGNOSIS — E859 Amyloidosis, unspecified: Secondary | ICD-10-CM | POA: Diagnosis not present

## 2013-04-12 DIAGNOSIS — Z85828 Personal history of other malignant neoplasm of skin: Secondary | ICD-10-CM | POA: Diagnosis not present

## 2013-04-12 DIAGNOSIS — E291 Testicular hypofunction: Secondary | ICD-10-CM | POA: Diagnosis not present

## 2013-04-12 DIAGNOSIS — N400 Enlarged prostate without lower urinary tract symptoms: Secondary | ICD-10-CM | POA: Diagnosis not present

## 2013-04-12 DIAGNOSIS — Z6828 Body mass index (BMI) 28.0-28.9, adult: Secondary | ICD-10-CM | POA: Diagnosis not present

## 2013-04-12 DIAGNOSIS — Z1331 Encounter for screening for depression: Secondary | ICD-10-CM | POA: Diagnosis not present

## 2013-04-12 DIAGNOSIS — Z905 Acquired absence of kidney: Secondary | ICD-10-CM | POA: Diagnosis not present

## 2013-04-12 NOTE — Telephone Encounter (Signed)
LEFT MESSAGE ON VM AND GAVE NEW PATIENT  02/03 @ 10:30 W/DR. SHADAD. LEFT MESSAGE ASKING PATIENT TO RETURN CALL TO CONFIRM MESSAGE WAS REC'D

## 2013-04-13 ENCOUNTER — Other Ambulatory Visit: Payer: Self-pay | Admitting: Oncology

## 2013-04-13 ENCOUNTER — Telehealth: Payer: Self-pay | Admitting: Oncology

## 2013-04-13 DIAGNOSIS — E859 Amyloidosis, unspecified: Secondary | ICD-10-CM

## 2013-04-13 NOTE — Telephone Encounter (Signed)
C/D 04/12/13 for appt. 04/17/13

## 2013-04-17 ENCOUNTER — Encounter: Payer: Self-pay | Admitting: Oncology

## 2013-04-17 ENCOUNTER — Ambulatory Visit: Payer: Medicare Other

## 2013-04-17 ENCOUNTER — Ambulatory Visit (HOSPITAL_BASED_OUTPATIENT_CLINIC_OR_DEPARTMENT_OTHER): Payer: Medicare Other | Admitting: Oncology

## 2013-04-17 ENCOUNTER — Other Ambulatory Visit (HOSPITAL_BASED_OUTPATIENT_CLINIC_OR_DEPARTMENT_OTHER): Payer: Medicare Other

## 2013-04-17 ENCOUNTER — Telehealth: Payer: Self-pay | Admitting: Oncology

## 2013-04-17 VITALS — BP 138/79 | HR 80 | Temp 97.4°F | Resp 18 | Ht 65.0 in | Wt 167.6 lb

## 2013-04-17 DIAGNOSIS — E859 Amyloidosis, unspecified: Secondary | ICD-10-CM | POA: Diagnosis not present

## 2013-04-17 DIAGNOSIS — R599 Enlarged lymph nodes, unspecified: Secondary | ICD-10-CM | POA: Diagnosis not present

## 2013-04-17 LAB — CBC WITH DIFFERENTIAL/PLATELET
BASO%: 0.9 % (ref 0.0–2.0)
Basophils Absolute: 0 10*3/uL (ref 0.0–0.1)
EOS%: 2.8 % (ref 0.0–7.0)
Eosinophils Absolute: 0.2 10*3/uL (ref 0.0–0.5)
HCT: 49.1 % (ref 38.4–49.9)
HGB: 16.7 g/dL (ref 13.0–17.1)
LYMPH%: 28.8 % (ref 14.0–49.0)
MCH: 32.5 pg (ref 27.2–33.4)
MCHC: 34 g/dL (ref 32.0–36.0)
MCV: 95.4 fL (ref 79.3–98.0)
MONO#: 0.4 10*3/uL (ref 0.1–0.9)
MONO%: 7 % (ref 0.0–14.0)
NEUT#: 3.3 10*3/uL (ref 1.5–6.5)
NEUT%: 60.5 % (ref 39.0–75.0)
Platelets: 281 10*3/uL (ref 140–400)
RBC: 5.14 10*6/uL (ref 4.20–5.82)
RDW: 13.8 % (ref 11.0–14.6)
WBC: 5.4 10*3/uL (ref 4.0–10.3)
lymph#: 1.6 10*3/uL (ref 0.9–3.3)

## 2013-04-17 LAB — COMPREHENSIVE METABOLIC PANEL (CC13)
ALT: 17 U/L (ref 0–55)
AST: 16 U/L (ref 5–34)
Albumin: 4.1 g/dL (ref 3.5–5.0)
Alkaline Phosphatase: 89 U/L (ref 40–150)
Anion Gap: 8 mEq/L (ref 3–11)
BUN: 17.3 mg/dL (ref 7.0–26.0)
CO2: 28 mEq/L (ref 22–29)
Calcium: 9.8 mg/dL (ref 8.4–10.4)
Chloride: 105 mEq/L (ref 98–109)
Creatinine: 1.1 mg/dL (ref 0.7–1.3)
Glucose: 106 mg/dl (ref 70–140)
Potassium: 4.1 mEq/L (ref 3.5–5.1)
Sodium: 141 mEq/L (ref 136–145)
Total Bilirubin: 0.45 mg/dL (ref 0.20–1.20)
Total Protein: 7.6 g/dL (ref 6.4–8.3)

## 2013-04-17 NOTE — Telephone Encounter (Signed)
gv pt appt schedule for march. per FS no lb needed.

## 2013-04-17 NOTE — Progress Notes (Signed)
Please see consult note.  

## 2013-04-17 NOTE — Consult Note (Signed)
Reason for Referral: Evaluation for possible amyloidosis.   HPI: This is a 68 year old rather healthy gentleman who was referred to me after a lymph node biopsy for the possible evaluation of amyloidosis. He is a pleasant man who first noticed a lump in his left upper inner thigh in 01/2013. He denies any pain in the area. He has not had any drainage from the area. He has not had any recent trauma or infections to that limb. He denies any nausea and vomiting. He is having normal bowel movements without any blood in his stool. He was evaluated by Dr. Marlou Starks and underwent surgical excision of a left groin lymph node that was done on 02/28/2013. The results of that lymph node biopsy showed the lymph node is entirely replaced by a large is significantly nodular deposits with extensive foreign body giant cell reaction. Deposition of eosinophilic material were also noted and the condo red staining reveals apple Green birefringence consistent with amyloid. There is no morphological finding suggestive of lymphoma and cultures are negative for any organisms. Her that reason patient referred to me for an evaluation. Clinically, he is completely asymptomatic. He has not reported any headaches blurred vision double vision or change in his mentation. He did not report any chest pain shortness of breath or difficulty breathing. Has not reported any leg edema or syncopal episodes. He has not reported any easy bruisability or ecchymosis. Has not reported any shortness of breath or chest pain. Has not reported any GI symptoms of nausea or vomiting or abdominal pain. Has not reported any urinary symptoms. 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   Past Medical History  Diagnosis Date  . Hyperlipidemia   . Dyspnea   . Prostatitis     acute and chronic  . Nephrolithiasis   :  Past Surgical History  Procedure Laterality Date  . Rotator cuff repair    . Appendectomy    . Elbow surgery  2003   right  . Colonoscopy  04/07/00  . Hernia repair    :  Current Outpatient Prescriptions  Medication Sig Dispense Refill  . Dietary Management Product (SENTRA PM PO) Take 1 tablet by mouth as needed.      . fluconazole (DIFLUCAN) 200 MG tablet Take 200 mg by mouth once a week.      . minoxidil (ROGAINE) 2 % external solution Apply 1 application topically 2 (two) times daily as needed.      . Tamsulosin HCl (FLOMAX) 0.4 MG CAPS Take 0.4 mg by mouth 2 (two) times daily.       . Testosterone (ANDROGEL PUMP TD) Place onto the skin. daily       . zolpidem (AMBIEN) 5 MG tablet 1 to 2 at bedtime as needed  60 tablet  0   No current facility-administered medications for this visit.       Allergies  Allergen Reactions  . Penicillins Swelling and Rash  :  Family History  Problem Relation Age of Onset  . Cancer Father     pancreatic  . Pancreatic cancer Father   . Parkinsonism Mother   :  History   Social History  . Marital Status: Married    Spouse Name: N/A    Number of Children: N/A  . Years of Education: N/A   Occupational History  . Not on file.   Social History Main Topics  . Smoking status: Former Smoker    Quit date: 03/15/1977  .  Smokeless tobacco: Never Used  . Alcohol Use: Yes  . Drug Use: No  . Sexual Activity: Yes    Birth Control/ Protection: None   Other Topics Concern  . Not on file   Social History Narrative  . No narrative on file  :  Constitutional: negative for anorexia, chills, fatigue and fevers Eyes: negative for cataracts and icterus Ears, nose, mouth, throat, and face: negative for epistaxis, hearing loss and nasal congestion Respiratory: negative for asthma, emphysema and hemoptysis Cardiovascular: negative for orthopnea, paroxysmal nocturnal dyspnea and tachypnea Gastrointestinal: negative for abdominal pain, change in bowel habits and reflux symptoms Genitourinary:negative for hematuria and urinary incontinence Integument/breast:  negative for pruritus and rash Hematologic/lymphatic: negative for bleeding, easy bruising and lymphadenopathy Musculoskeletal:negative for arthralgias, back pain and bone pain Neurological: negative for dizziness, gait problems and headaches Behavioral/Psych: negative for depression and fatigue Endocrine: negative for temperature intolerance  Exam: Blood pressure 138/79, pulse 80, temperature 97.4 F (36.3 C), temperature source Oral, resp. rate 18, height 5' 5" (1.651 m), weight 167 lb 9.6 oz (76.023 kg). General appearance: alert, cooperative and appears stated age Head: Normocephalic, without obvious abnormality, atraumatic Eyes: conjunctivae/corneas clear. PERRL, EOM's intact. Fundi benign. Throat: lips, mucosa, and tongue normal; teeth and gums normal Neck: no adenopathy, no carotid bruit, no JVD, supple, symmetrical, trachea midline and thyroid not enlarged, symmetric, no tenderness/mass/nodules Back: symmetric, no curvature. ROM normal. No CVA tenderness. Resp: clear to auscultation bilaterally Chest wall: no tenderness Cardio: regular rate and rhythm, S1, S2 normal, no murmur, click, rub or gallop GI: soft, non-tender; bowel sounds normal; no masses,  no organomegaly Extremities: extremities normal, atraumatic, no cyanosis or edema Pulses: 2+ and symmetric Skin: Skin color, texture, turgor normal. No rashes or lesions Lymph nodes: Cervical, supraclavicular, and axillary nodes normal. Neurologic: Grossly normal Incision/Wound: left groin incision appeared normal.    Recent Labs  04/17/13 1044  WBC 5.4  HGB 16.7  HCT 49.1  PLT 281      Assessment and Plan:   68 year old gentleman with the incidental an asymptomatic finding have amyloid deposition on the left groin lymph node. He underwent a lymph node biopsy in December of 2014 and the findings suggest amyloid deposition. The differential diagnosis was discussed today with the patient in detail. I explained to him that  this could be a reactive amyloid deposition versus a sign of systemic amyloidosis. Different dose of amyloidosis were discussed today including AL amyloidosis which is a part of a plasma cell disorder versus AA amyloidosis which is a sign of chronic disease. He is completely asymptomatic and does not exhibits any signs of any chronic diseases or chronic inflammatory condition such as rheumatoid arthritis or inflammatory bowel disease. He also does not exhibit any signs of symptoms of amyloid deposition in the kidney, skin or heart.  To work this up completely, I will obtain serum protein electrophoresis and quantitative immunoglobulins. I will also obtain light chains quantification to work him up for AL amyloidosis. I've explained to him he might need a bone marrow biopsy and a possible 24-hour urine collection for total proteinuria. Once these studies are completed I will set him with a followup to discuss these results.

## 2013-04-17 NOTE — Progress Notes (Signed)
Checked in new pt with no financial concerns. °

## 2013-04-19 LAB — SPEP & IFE WITH QIG
Albumin ELP: 58.9 % (ref 55.8–66.1)
Alpha-1-Globulin: 3.1 % (ref 2.9–4.9)
Alpha-2-Globulin: 8.2 % (ref 7.1–11.8)
Beta 2: 4.1 % (ref 3.2–6.5)
Beta Globulin: 5.7 % (ref 4.7–7.2)
Gamma Globulin: 20 % — ABNORMAL HIGH (ref 11.1–18.8)
IgA: 970 mg/dL — ABNORMAL HIGH (ref 68–379)
IgG (Immunoglobin G), Serum: 565 mg/dL — ABNORMAL LOW (ref 650–1600)
IgM, Serum: 16 mg/dL — ABNORMAL LOW (ref 41–251)
M-Spike, %: 1.05 g/dL
Total Protein, Serum Electrophoresis: 7.5 g/dL (ref 6.0–8.3)

## 2013-04-19 LAB — KAPPA/LAMBDA LIGHT CHAINS
Kappa free light chain: 5.38 mg/dL — ABNORMAL HIGH (ref 0.33–1.94)
Kappa:Lambda Ratio: 4.89 — ABNORMAL HIGH (ref 0.26–1.65)
Lambda Free Lght Chn: 1.1 mg/dL (ref 0.57–2.63)

## 2013-04-20 ENCOUNTER — Telehealth: Payer: Self-pay | Admitting: Cardiology

## 2013-04-20 NOTE — Telephone Encounter (Signed)
Rec'd from [facility] forward 1 page to Dr.Brackbill

## 2013-04-23 ENCOUNTER — Other Ambulatory Visit: Payer: Self-pay | Admitting: Oncology

## 2013-04-23 DIAGNOSIS — E859 Amyloidosis, unspecified: Secondary | ICD-10-CM

## 2013-04-24 ENCOUNTER — Other Ambulatory Visit: Payer: Self-pay | Admitting: Radiology

## 2013-04-27 ENCOUNTER — Encounter (HOSPITAL_COMMUNITY): Payer: Self-pay | Admitting: Pharmacy Technician

## 2013-04-27 ENCOUNTER — Ambulatory Visit (HOSPITAL_COMMUNITY)
Admission: RE | Admit: 2013-04-27 | Discharge: 2013-04-27 | Disposition: A | Discharge status: Home / Self Care | Payer: Medicare Other | Source: Ambulatory Visit | Attending: Oncology | Admitting: Oncology

## 2013-04-27 DIAGNOSIS — R599 Enlarged lymph nodes, unspecified: Secondary | ICD-10-CM | POA: Diagnosis not present

## 2013-04-27 DIAGNOSIS — C8292 Follicular lymphoma, unspecified, intrathoracic lymph nodes: Secondary | ICD-10-CM | POA: Diagnosis not present

## 2013-04-27 DIAGNOSIS — E859 Amyloidosis, unspecified: Secondary | ICD-10-CM | POA: Diagnosis not present

## 2013-04-27 MED ORDER — IOHEXOL 300 MG/ML  SOLN
100.0000 mL | Freq: Once | INTRAMUSCULAR | Status: AC | PRN
  Administered 2013-04-27: 100 mL via INTRAVENOUS

## 2013-05-01 ENCOUNTER — Encounter (HOSPITAL_COMMUNITY): Payer: Self-pay

## 2013-05-01 ENCOUNTER — Ambulatory Visit (HOSPITAL_COMMUNITY)
Admission: RE | Admit: 2013-05-01 | Discharge: 2013-05-01 | Disposition: A | Discharge status: Home / Self Care | Payer: Medicare Other | Source: Ambulatory Visit | Attending: Oncology | Admitting: Oncology

## 2013-05-01 DIAGNOSIS — E785 Hyperlipidemia, unspecified: Secondary | ICD-10-CM | POA: Insufficient documentation

## 2013-05-01 DIAGNOSIS — E8809 Other disorders of plasma-protein metabolism, not elsewhere classified: Secondary | ICD-10-CM | POA: Diagnosis not present

## 2013-05-01 DIAGNOSIS — E859 Amyloidosis, unspecified: Secondary | ICD-10-CM | POA: Insufficient documentation

## 2013-05-01 DIAGNOSIS — Z79899 Other long term (current) drug therapy: Secondary | ICD-10-CM | POA: Insufficient documentation

## 2013-05-01 DIAGNOSIS — Z87442 Personal history of urinary calculi: Secondary | ICD-10-CM | POA: Diagnosis not present

## 2013-05-01 DIAGNOSIS — Z87891 Personal history of nicotine dependence: Secondary | ICD-10-CM | POA: Insufficient documentation

## 2013-05-01 DIAGNOSIS — M899 Disorder of bone, unspecified: Secondary | ICD-10-CM | POA: Diagnosis not present

## 2013-05-01 DIAGNOSIS — D704 Cyclic neutropenia: Secondary | ICD-10-CM | POA: Diagnosis not present

## 2013-05-01 DIAGNOSIS — D499 Neoplasm of unspecified behavior of unspecified site: Secondary | ICD-10-CM | POA: Insufficient documentation

## 2013-05-01 LAB — CBC
HCT: 47.1 % (ref 39.0–52.0)
Hemoglobin: 16.1 g/dL (ref 13.0–17.0)
MCH: 32.1 pg (ref 26.0–34.0)
MCHC: 34.2 g/dL (ref 30.0–36.0)
MCV: 93.8 fL (ref 78.0–100.0)
Platelets: 246 10*3/uL (ref 150–400)
RBC: 5.02 MIL/uL (ref 4.22–5.81)
RDW: 13.8 % (ref 11.5–15.5)
WBC: 6.8 10*3/uL (ref 4.0–10.5)

## 2013-05-01 LAB — APTT: aPTT: 29 seconds (ref 24–37)

## 2013-05-01 LAB — PROTIME-INR
INR: 1.03 (ref 0.00–1.49)
Prothrombin Time: 13.3 seconds (ref 11.6–15.2)

## 2013-05-01 LAB — BONE MARROW EXAM

## 2013-05-01 MED ORDER — FENTANYL CITRATE 0.05 MG/ML IJ SOLN
INTRAMUSCULAR | Status: AC
  Filled 2013-05-01: qty 6

## 2013-05-01 MED ORDER — HYDROCODONE-ACETAMINOPHEN 5-325 MG PO TABS
1.0000 | ORAL_TABLET | ORAL | Status: DC | PRN
  Filled 2013-05-01: qty 2

## 2013-05-01 MED ORDER — SODIUM CHLORIDE 0.9 % IV SOLN
Freq: Once | INTRAVENOUS | Status: AC
  Administered 2013-05-01: 09:00:00 via INTRAVENOUS

## 2013-05-01 MED ORDER — FENTANYL CITRATE 0.05 MG/ML IJ SOLN
INTRAMUSCULAR | Status: DC | PRN
  Administered 2013-05-01 (×2): 50 ug via INTRAVENOUS

## 2013-05-01 MED ORDER — MIDAZOLAM HCL 2 MG/2ML IJ SOLN
INTRAMUSCULAR | Status: DC | PRN
  Administered 2013-05-01 (×2): 1 mg via INTRAVENOUS

## 2013-05-01 MED ORDER — MIDAZOLAM HCL 2 MG/2ML IJ SOLN
INTRAMUSCULAR | Status: AC
  Filled 2013-05-01: qty 6

## 2013-05-01 NOTE — H&P (Signed)
William Barron is an 68 y.o. male.   Chief Complaint: " I'm here for a bone marrow biopsy" HPI: Patient with recent finding of amyloid deposition in left groin lymph node biopsy presents today for CT guided bone marrow biopsy .  Past Medical History  Diagnosis Date  . Hyperlipidemia   . Dyspnea   . Prostatitis     acute and chronic  . Nephrolithiasis     Past Surgical History  Procedure Laterality Date  . Rotator cuff repair    . Appendectomy    . Elbow surgery  2003    right  . Colonoscopy  04/07/00  . Hernia repair      Family History  Problem Relation Age of Onset  . Cancer Father     pancreatic  . Pancreatic cancer Father   . Parkinsonism Mother    Social History:  reports that he quit smoking about 36 years ago. He has never used smokeless tobacco. He reports that he drinks alcohol. He reports that he does not use illicit drugs.  Allergies:  Allergies  Allergen Reactions  . Penicillins Swelling and Rash    Current outpatient prescriptions:Avanafil (STENDRA) 200 MG TABS, Take 200 mg by mouth every evening., Disp: , Rfl: ;  minoxidil (ROGAINE) 2 % external solution, Apply 1 application topically 2 (two) times daily as needed., Disp: , Rfl: ;  Tamsulosin HCl (FLOMAX) 0.4 MG CAPS, Take 0.4 mg by mouth 2 (two) times daily. , Disp: , Rfl: ;  Testosterone (ANDROGEL PUMP TD), Place 3 sprays onto the skin daily. daily, Disp: , Rfl:  fluconazole (DIFLUCAN) 200 MG tablet, Take 200 mg by mouth every Sunday. , Disp: , Rfl: ;  zolpidem (AMBIEN) 5 MG tablet, Take 2.5-5 mg by mouth at bedtime as needed for sleep., Disp: , Rfl:  Current facility-administered medications:fentaNYL (SUBLIMAZE) 0.05 MG/ML injection, , , , ;  midazolam (VERSED) 2 MG/2ML injection, , , ,    Results for orders placed during the hospital encounter of 05/01/13 (from the past 48 hour(s))  APTT     Status: None   Collection Time    05/01/13  9:11 AM      Result Value Ref Range   aPTT 29  24 - 37 seconds   CBC     Status: None   Collection Time    05/01/13  9:11 AM      Result Value Ref Range   WBC 6.8  4.0 - 10.5 K/uL   RBC 5.02  4.22 - 5.81 MIL/uL   Hemoglobin 16.1  13.0 - 17.0 g/dL   HCT 47.1  39.0 - 52.0 %   MCV 93.8  78.0 - 100.0 fL   MCH 32.1  26.0 - 34.0 pg   MCHC 34.2  30.0 - 36.0 g/dL   RDW 13.8  11.5 - 15.5 %   Platelets 246  150 - 400 K/uL  PROTIME-INR     Status: None   Collection Time    05/01/13  9:11 AM      Result Value Ref Range   Prothrombin Time 13.3  11.6 - 15.2 seconds   INR 1.03  0.00 - 1.49   No results found.  Review of Systems  Constitutional: Negative for fever and chills.  Respiratory: Negative for cough and shortness of breath.   Gastrointestinal: Negative for nausea, vomiting and abdominal pain.  Musculoskeletal: Negative for back pain.  Neurological: Negative for headaches.  Endo/Heme/Allergies: Does not bruise/bleed easily.   Vitals: BP 143/53  HR 78  R 18  O2 SATS 94% RA  TEMP 97.7 Physical Exam  Constitutional: He is oriented to person, place, and time. He appears well-developed and well-nourished.  Cardiovascular: Normal rate and regular rhythm.   Respiratory: Effort normal and breath sounds normal.  GI: Soft. There is no tenderness.  Musculoskeletal: Normal range of motion. He exhibits no edema.  Neurological: He is alert and oriented to person, place, and time.     Assessment/Plan Patient with recent finding of amyloid deposition in left groin lymph node biopsy presents today for CT guided bone marrow biopsy . Details/risks of procedure d/w pt with his understanding and consent.   Marabella Popiel,D KEVIN 05/01/2013, 9:51 AM

## 2013-05-01 NOTE — Discharge Instructions (Signed)
Moderate Sedation, Adult °Moderate sedation is given to help you relax or even sleep through a procedure. You may remain sleepy, be clumsy, or have poor balance for several hours following this procedure. Arrange for a responsible adult, family member, or friend to take you home. A responsible adult should stay with you for at least 24 hours or until the medicines have worn off. °· Do not participate in any activities where you could become injured for the next 24 hours, or until you feel normal again. Do not: °· Drive. °· Swim. °· Ride a bicycle. °· Operate heavy machinery. °· Cook. °· Use power tools. °· Climb ladders. °· Work at heights. °· Do not make important decisions or sign legal documents until you are improved. °· Vomiting may occur if you eat too soon. When you can drink without vomiting, try water, juice, or soup. Try solid foods if you feel little or no nausea. °· Only take over-the-counter or prescription medications for pain, discomfort, or fever as directed by your caregiver.If pain medications have been prescribed for you, ask your caregiver how soon it is safe to take them. °· Make sure you and your family fully understands everything about the medication given to you. Make sure you understand what side effects may occur. °· You should not drink alcohol, take sleeping pills, or medications that cause drowsiness for at least 24 hours. °· If you smoke, do not smoke alone. °· If you are feeling better, you may resume normal activities 24 hours after receiving sedation. °· Keep all appointments as scheduled. Follow all instructions. °· Ask questions if you do not understand. °SEEK MEDICAL CARE IF:  °· Your skin is pale or bluish in color. °· You continue to feel sick to your stomach (nauseous) or throw up (vomit). °· Your pain is getting worse and not helped by medication. °· You have bleeding or swelling. °· You are still sleepy or feeling clumsy after 24 hours. °SEEK IMMEDIATE MEDICAL CARE IF:   °· You develop a rash. °· You have difficulty breathing. °· You develop any type of allergic problem. °· You have a fever. °Document Released: 11/24/2000 Document Revised: 05/24/2011 Document Reviewed: 11/06/2012 °ExitCare® Patient Information ©2014 [employer]. °Bone Marrow Aspiration, Bone Marrow Biopsy °Care After °Read the instructions outlined below and refer to this sheet in the next few weeks. These discharge instructions provide you with general information on caring for yourself after you leave the hospital. Your caregiver may also give you specific instructions. While your treatment has been planned according to the most current medical practices available, unavoidable complications occasionally occur. If you have any problems or questions after discharge, call your caregiver. °FINDING OUT THE RESULTS OF YOUR TEST °Not all test results are available during your visit. If your test results are not back during the visit, make an appointment with your caregiver to find out the results. Do not assume everything is normal if you have not heard from your caregiver or the medical facility. It is important for you to follow up on all of your test results.  °HOME CARE INSTRUCTIONS  °You have had sedation and may be sleepy or dizzy. Your thinking may not be as clear as usual. For the next 24 hours: °· Only take over-the-counter or prescription medicines for pain, discomfort, and or fever as directed by your caregiver. °· Do not drink alcohol. °· Do not smoke. °· Do not drive. °· Do not make important legal decisions. °· Do not operate heavy machinery. °·   Do not care for small children by yourself. °· Keep your dressing clean and dry. You may replace dressing with a bandage after 24 hours. °· You may take a bath or shower after 24 hours. °· Use an ice pack for 20 minutes every 2 hours while awake for pain as needed. °SEEK MEDICAL CARE IF:  °· There is redness, swelling, or increasing pain at the biopsy  site. °· There is pus coming from the biopsy site. °· There is drainage from a biopsy site lasting longer than one day. °· An unexplained oral temperature above 102° F (38.9° C) develops. °SEEK IMMEDIATE MEDICAL CARE IF:  °· You develop a rash. °· You have difficulty breathing. °· You develop any reaction or side effects to medications given. °Document Released: 09/18/2004 Document Revised: 05/24/2011 Document Reviewed: 02/27/2008 °ExitCare® Patient Information ©2014 [employer]. ° °

## 2013-05-01 NOTE — Procedures (Signed)
CT-guided  R iliac bone marrow aspiration and core biopsy No complication No blood loss. See complete dictation in [facility] P[facility]  

## 2013-05-02 ENCOUNTER — Other Ambulatory Visit (HOSPITAL_COMMUNITY): Payer: Medicare Other

## 2013-05-03 DIAGNOSIS — E291 Testicular hypofunction: Secondary | ICD-10-CM | POA: Diagnosis not present

## 2013-05-03 DIAGNOSIS — Z125 Encounter for screening for malignant neoplasm of prostate: Secondary | ICD-10-CM | POA: Diagnosis not present

## 2013-05-10 ENCOUNTER — Other Ambulatory Visit (HOSPITAL_COMMUNITY): Payer: Medicare Other

## 2013-05-10 LAB — CHROMOSOME ANALYSIS, BONE MARROW

## 2013-05-17 ENCOUNTER — Telehealth: Payer: Self-pay | Admitting: Medical Oncology

## 2013-05-17 ENCOUNTER — Telehealth: Payer: Self-pay | Admitting: Oncology

## 2013-05-17 ENCOUNTER — Encounter: Payer: Self-pay | Admitting: Oncology

## 2013-05-17 ENCOUNTER — Encounter (INDEPENDENT_AMBULATORY_CARE_PROVIDER_SITE_OTHER): Payer: Self-pay

## 2013-05-17 ENCOUNTER — Ambulatory Visit (HOSPITAL_BASED_OUTPATIENT_CLINIC_OR_DEPARTMENT_OTHER): Payer: Medicare Other | Admitting: Oncology

## 2013-05-17 VITALS — BP 151/75 | HR 91 | Temp 97.5°F | Resp 19 | Ht 66.0 in | Wt 165.9 lb

## 2013-05-17 DIAGNOSIS — C9030 Solitary plasmacytoma not having achieved remission: Secondary | ICD-10-CM

## 2013-05-17 DIAGNOSIS — E859 Amyloidosis, unspecified: Secondary | ICD-10-CM

## 2013-05-17 NOTE — Telephone Encounter (Signed)
Per MD, appt made with Dr. Amalia Hailey at [facility] for Thursday March 26 @ 11 (soonest appt they had available). They requested for all medical records pertaining to pt's dx to be faxed to the attention of Amy @ [phone]. Patient informed of day and time. [name] to call patient on his cell phone # only. Patient denies further questions at this time, knows to call office with any questions or concerns.  Med records to fax all records pertaining to pts diagnosis.  Dr Ilene Qua informed of appt with [name].

## 2013-05-17 NOTE — Telephone Encounter (Signed)
gv and printed appt sched anda vs for pt for March.... °

## 2013-05-17 NOTE — Progress Notes (Signed)
Hematology and Oncology Follow Up Visit  William Barron [id number] 02-11-1946 68 y.o. 05/17/2013 4:27 PM   Principle Diagnosis:  68 year old gentleman with a plasma cell disorder manifesting itself with possible amyloidosis versus multiple myeloma. He has a documented IgA kappa monoclonal protein. His M spike of 1.05 and an IgA level of 970. He also has a lymph node biopsy documenting the presence of amyloidosis.   Current therapy: He is under evaluation for treatment options.  Interim History:  This is a pleasant 68 year old gentleman seen in followup after my initial evaluation in February of 2015. Since his evaluation, he underwent a bone marrow biopsy which showed hypercellular marrow with about 20% plasma cells but no amyloidosis. He also had a CT scan chest abdomen and pelvis which did not show any pathological lymphadenopathy other than bore enlarged pelvic lymph nodes. There is no bony lesions noted on his imaging studies. Since his last visit he continues to be asymptomatic. He denied any respiratory symptoms of congestive heart failure symptoms. Did not have any lower extremity edema or dyspnea on exertion. He did not have any bruisability or decline in his performance status.  Medications: I have reviewed the patient's current medications.  Current Outpatient Prescriptions  Medication Sig Dispense Refill  . Avanafil (STENDRA) 200 MG TABS Take 200 mg by mouth every evening.      . fluconazole (DIFLUCAN) 200 MG tablet Take 200 mg by mouth every Sunday.       . minoxidil (ROGAINE) 2 % external solution Apply 1 application topically 2 (two) times daily as needed.      . Tamsulosin HCl (FLOMAX) 0.4 MG CAPS Take 0.4 mg by mouth 2 (two) times daily.       . Testosterone (ANDROGEL PUMP TD) Place 3 sprays onto the skin daily. daily      . zolpidem (AMBIEN) 5 MG tablet Take 2.5-5 mg by mouth at bedtime as needed for sleep.       No current facility-administered medications for this visit.      Allergies:  Allergies  Allergen Reactions  . Penicillins Swelling and Rash    Past Medical History, Surgical history, Social history, and Family History were reviewed and updated.  Review of Systems: Constitutional:  Negative for fever, chills, night sweats, anorexia, weight loss, pain. Cardiovascular: no chest pain or dyspnea on exertion Respiratory: no cough, shortness of breath, or wheezing Neurological: no TIA or stroke symptoms Dermatological: negative ENT: negative Skin: Negative. Gastrointestinal: no abdominal pain, change in bowel habits, or black or bloody stools Genito-Urinary: no dysuria, trouble voiding, or hematuria Hematological and Lymphatic: negative Breast: negative Musculoskeletal: negative Remaining ROS negative. Physical Exam: Blood pressure 151/75, pulse 91, temperature 97.5 F (36.4 C), temperature source Oral, resp. rate 19, height '5\' 6"'  (1.676 m), weight 165 lb 14.4 oz (75.252 kg). ECOG: 0 General appearance: alert, cooperative and appears stated age Head: Normocephalic, without obvious abnormality, atraumatic Neck: no adenopathy, no carotid bruit, no JVD, supple, symmetrical, trachea midline and thyroid not enlarged, symmetric, no tenderness/mass/nodules Lymph nodes: Cervical, supraclavicular, and axillary nodes normal. Heart:regular rate and rhythm, S1, S2 normal, no murmur, click, rub or gallop Lung:chest clear, no wheezing, rales, normal symmetric air entry Abdomin: soft, non-tender, without masses or organomegaly EXT:no erythema, induration, or nodules   Lab Results: Lab Results  Component Value Date   WBC 6.8 05/01/2013   HGB 16.1 05/01/2013   HCT 47.1 05/01/2013   MCV 93.8 05/01/2013   PLT 246 05/01/2013  Chemistry      Component Value Date/Time   NA 141 04/17/2013 1044   NA 141 03/29/2011 1448   K 4.1 04/17/2013 1044   K 3.6 03/29/2011 1448   CL 101 03/29/2011 1448   CO2 28 04/17/2013 1044   CO2 26 03/29/2011 1448   BUN 17.3 04/17/2013  1044   BUN 17 03/29/2011 1448   CREATININE 1.1 04/17/2013 1044   CREATININE 1.0 03/29/2011 1448      Component Value Date/Time   CALCIUM 9.8 04/17/2013 1044   CALCIUM 8.9 03/29/2011 1448   ALKPHOS 89 04/17/2013 1044   ALKPHOS 70 03/29/2011 1448   AST 16 04/17/2013 1044   AST 22 03/29/2011 1448   ALT 17 04/17/2013 1044   ALT 16 03/29/2011 1448   BILITOT 0.45 04/17/2013 1044   BILITOT 1.0 03/29/2011 1448       Radiological Studies: EXAM:  CT CHEST, ABDOMEN, AND PELVIS WITH CONTRAST  TECHNIQUE:  Multidetector CT imaging of the chest, abdomen and pelvis was  performed following the standard protocol during bolus  administration of intravenous contrast.  CONTRAST: 159m OMNIPAQUE IOHEXOL 300 MG/ML SOLN  COMPARISON: DG CHEST 2V dated 08/10/2012  FINDINGS:  CT CHEST FINDINGS  No axillary or supraclavicular lymphadenopathy. Mild mediastinal  lymphadenopathy. For example, 23 mm subcarinal lymph node (image  28). 14 mm prevascular lymph node. Bilateral mild hilar  lymphadenopathy. Example left 14 mm hilar node (image 31, series 2).  Review of the lung parenchyma demonstrates no suspicious pulmonary  nodules. Airways are normal.  CT ABDOMEN AND PELVIS FINDINGS  No focal hepatic lesion. Gallbladder, pancreas, spleen, adrenal  glands, and kidneys are normal.  The stomach, small bowel, cecum are normal. The colon rectosigmoid  colon are normal  Abdominal aorta is normal caliber. No retroperitoneal periportal  lymphadenopathy. No mesenteric adenopathy.  No free fluid the pelvis. Prostate gland and bladder normal. No  pelvic lymphadenopathy. 10 mm right external iliac lymph node (image  110) is borderline pathologic by size criteria. No inguinal  adenopathy.  No aggressive osseous lesion.  IMPRESSION:  1. Mild mediastinal and hilar lymphadenopathy.  2. No abdominal or retroperitoneal lymphadenopathy. No pelvic  lymphadenopathy.  3. Borderline enlarged right external iliac lymph node.     Impression and Plan:  68year old gentleman with the diagnosis of a plasma cell disorder. He presented with a inguinal lymph node that was biopsy proven to show amyloid and his laboratory testing showed a IgA kappa monoclonal protein with about 20% plasma cell infiltration in the bone marrow. The differential diagnosis was discussed today in detail with the patient including the natural history of plasma cell disorder is. This could represents purely multiple myeloma IgA kappa subtype versus AL amyloidosis. The next step would be to refer him to an expert center for plasma cell disorder should for better differentiation of these entities as well as recommendations regarding treatment. [facility] and I will be happy to facilitate a local treatment here is felt necessary. He has not had any skeletal survey or cardiac evaluation I will leave those up to the discretion of Dr. TAmalia Haileyat that time.   SZola Button MD 3/5/20154:27 PM

## 2013-05-18 ENCOUNTER — Telehealth: Payer: Self-pay | Admitting: Medical Oncology

## 2013-05-18 NOTE — Telephone Encounter (Signed)
Patient's medical records faxed to the attention of Amy @ Dr. Phoebe Perch  Office at [facility].

## 2013-05-21 ENCOUNTER — Telehealth: Payer: Self-pay | Admitting: *Deleted

## 2013-05-21 NOTE — Telephone Encounter (Signed)
rec'd a call from dr [employer] office at [facility]. appt has been moved up from march 26th, to march 17th at 8:00 am. Notified patient.

## 2013-05-22 ENCOUNTER — Telehealth: Payer: Self-pay | Admitting: Oncology

## 2013-05-22 NOTE — Telephone Encounter (Signed)
Faxed pt ins card to Dr. Amalia Hailey office. Slides and scans will be fedex'ed

## 2013-05-29 ENCOUNTER — Ambulatory Visit (HOSPITAL_COMMUNITY): Payer: Medicare Other

## 2013-05-29 DIAGNOSIS — E8809 Other disorders of plasma-protein metabolism, not elsewhere classified: Secondary | ICD-10-CM | POA: Diagnosis not present

## 2013-05-29 DIAGNOSIS — E559 Vitamin D deficiency, unspecified: Secondary | ICD-10-CM | POA: Diagnosis not present

## 2013-05-29 DIAGNOSIS — R5381 Other malaise: Secondary | ICD-10-CM | POA: Diagnosis not present

## 2013-05-29 DIAGNOSIS — E859 Amyloidosis, unspecified: Secondary | ICD-10-CM | POA: Diagnosis not present

## 2013-05-29 DIAGNOSIS — R5383 Other fatigue: Secondary | ICD-10-CM | POA: Diagnosis not present

## 2013-05-30 DIAGNOSIS — C9000 Multiple myeloma not having achieved remission: Secondary | ICD-10-CM | POA: Diagnosis not present

## 2013-05-30 DIAGNOSIS — E859 Amyloidosis, unspecified: Secondary | ICD-10-CM | POA: Diagnosis not present

## 2013-06-04 ENCOUNTER — Telehealth: Payer: Self-pay | Admitting: Medical Oncology

## 2013-06-04 NOTE — Telephone Encounter (Signed)
Patient William Barron cancelling tomorrow's appt with Dr. Alen Blew, states was referred to [facility] by Dr Alen Blew and Rob Hickman will be following his care for now.  MD inboxed

## 2013-06-05 ENCOUNTER — Ambulatory Visit: Payer: Medicare Other | Admitting: Oncology

## 2013-06-12 ENCOUNTER — Other Ambulatory Visit (HOSPITAL_COMMUNITY): Payer: Medicare Other

## 2013-06-14 DIAGNOSIS — R5383 Other fatigue: Secondary | ICD-10-CM | POA: Diagnosis not present

## 2013-06-14 DIAGNOSIS — I379 Nonrheumatic pulmonary valve disorder, unspecified: Secondary | ICD-10-CM | POA: Diagnosis not present

## 2013-06-14 DIAGNOSIS — I080 Rheumatic disorders of both mitral and aortic valves: Secondary | ICD-10-CM | POA: Diagnosis not present

## 2013-06-14 DIAGNOSIS — E859 Amyloidosis, unspecified: Secondary | ICD-10-CM | POA: Diagnosis not present

## 2013-06-14 DIAGNOSIS — R9389 Abnormal findings on diagnostic imaging of other specified body structures: Secondary | ICD-10-CM | POA: Diagnosis not present

## 2013-06-14 DIAGNOSIS — I079 Rheumatic tricuspid valve disease, unspecified: Secondary | ICD-10-CM | POA: Diagnosis not present

## 2013-06-14 DIAGNOSIS — R5381 Other malaise: Secondary | ICD-10-CM | POA: Diagnosis not present

## 2013-06-20 DIAGNOSIS — E291 Testicular hypofunction: Secondary | ICD-10-CM | POA: Diagnosis not present

## 2013-06-20 DIAGNOSIS — E859 Amyloidosis, unspecified: Secondary | ICD-10-CM | POA: Diagnosis not present

## 2013-06-20 DIAGNOSIS — Z0000 Encounter for general adult medical examination without abnormal findings: Secondary | ICD-10-CM | POA: Diagnosis not present

## 2013-07-01 DIAGNOSIS — S93409A Sprain of unspecified ligament of unspecified ankle, initial encounter: Secondary | ICD-10-CM | POA: Diagnosis not present

## 2013-07-03 DIAGNOSIS — E559 Vitamin D deficiency, unspecified: Secondary | ICD-10-CM | POA: Diagnosis not present

## 2013-07-03 DIAGNOSIS — E8589 Other amyloidosis: Secondary | ICD-10-CM | POA: Diagnosis not present

## 2013-07-03 DIAGNOSIS — R599 Enlarged lymph nodes, unspecified: Secondary | ICD-10-CM | POA: Diagnosis not present

## 2013-07-05 DIAGNOSIS — E859 Amyloidosis, unspecified: Secondary | ICD-10-CM | POA: Diagnosis not present

## 2013-07-17 DIAGNOSIS — M76829 Posterior tibial tendinitis, unspecified leg: Secondary | ICD-10-CM | POA: Diagnosis not present

## 2013-07-24 DIAGNOSIS — M76829 Posterior tibial tendinitis, unspecified leg: Secondary | ICD-10-CM | POA: Diagnosis not present

## 2013-07-24 DIAGNOSIS — IMO0002 Reserved for concepts with insufficient information to code with codable children: Secondary | ICD-10-CM | POA: Diagnosis not present

## 2013-07-24 DIAGNOSIS — M659 Synovitis and tenosynovitis, unspecified: Secondary | ICD-10-CM | POA: Diagnosis not present

## 2013-08-09 DIAGNOSIS — M6789 Other specified disorders of synovium and tendon, multiple sites: Secondary | ICD-10-CM | POA: Diagnosis not present

## 2013-08-09 DIAGNOSIS — M25579 Pain in unspecified ankle and joints of unspecified foot: Secondary | ICD-10-CM | POA: Diagnosis not present

## 2013-08-14 ENCOUNTER — Other Ambulatory Visit: Payer: Self-pay | Admitting: Dermatology

## 2013-08-14 DIAGNOSIS — D2330 Other benign neoplasm of skin of unspecified part of face: Secondary | ICD-10-CM | POA: Diagnosis not present

## 2013-08-14 DIAGNOSIS — B351 Tinea unguium: Secondary | ICD-10-CM | POA: Diagnosis not present

## 2013-08-14 DIAGNOSIS — L723 Sebaceous cyst: Secondary | ICD-10-CM | POA: Diagnosis not present

## 2013-08-15 DIAGNOSIS — M25579 Pain in unspecified ankle and joints of unspecified foot: Secondary | ICD-10-CM | POA: Diagnosis not present

## 2013-08-17 ENCOUNTER — Other Ambulatory Visit: Payer: Self-pay | Admitting: Oncology

## 2013-08-17 DIAGNOSIS — R591 Generalized enlarged lymph nodes: Secondary | ICD-10-CM

## 2013-08-17 DIAGNOSIS — E859 Amyloidosis, unspecified: Secondary | ICD-10-CM

## 2013-08-21 DIAGNOSIS — M6789 Other specified disorders of synovium and tendon, multiple sites: Secondary | ICD-10-CM | POA: Diagnosis not present

## 2013-08-21 DIAGNOSIS — M25579 Pain in unspecified ankle and joints of unspecified foot: Secondary | ICD-10-CM | POA: Diagnosis not present

## 2013-08-22 ENCOUNTER — Encounter (HOSPITAL_COMMUNITY): Payer: Self-pay | Admitting: Pharmacy Technician

## 2013-08-24 ENCOUNTER — Other Ambulatory Visit: Payer: Self-pay | Admitting: Radiology

## 2013-08-27 ENCOUNTER — Ambulatory Visit (HOSPITAL_COMMUNITY)
Admission: RE | Admit: 2013-08-27 | Discharge: 2013-08-27 | Disposition: A | Discharge status: Home / Self Care | Payer: Medicare Other | Source: Ambulatory Visit | Attending: Oncology | Admitting: Oncology

## 2013-08-27 ENCOUNTER — Encounter (HOSPITAL_COMMUNITY): Payer: Self-pay

## 2013-08-27 DIAGNOSIS — E859 Amyloidosis, unspecified: Secondary | ICD-10-CM

## 2013-08-27 DIAGNOSIS — Z79899 Other long term (current) drug therapy: Secondary | ICD-10-CM | POA: Insufficient documentation

## 2013-08-27 DIAGNOSIS — D7589 Other specified diseases of blood and blood-forming organs: Secondary | ICD-10-CM | POA: Insufficient documentation

## 2013-08-27 DIAGNOSIS — Z87442 Personal history of urinary calculi: Secondary | ICD-10-CM | POA: Insufficient documentation

## 2013-08-27 DIAGNOSIS — N419 Inflammatory disease of prostate, unspecified: Secondary | ICD-10-CM | POA: Insufficient documentation

## 2013-08-27 DIAGNOSIS — C9030 Solitary plasmacytoma not having achieved remission: Secondary | ICD-10-CM | POA: Diagnosis not present

## 2013-08-27 DIAGNOSIS — Z87891 Personal history of nicotine dependence: Secondary | ICD-10-CM | POA: Insufficient documentation

## 2013-08-27 DIAGNOSIS — E785 Hyperlipidemia, unspecified: Secondary | ICD-10-CM | POA: Insufficient documentation

## 2013-08-27 DIAGNOSIS — R591 Generalized enlarged lymph nodes: Secondary | ICD-10-CM

## 2013-08-27 DIAGNOSIS — D499 Neoplasm of unspecified behavior of unspecified site: Secondary | ICD-10-CM | POA: Insufficient documentation

## 2013-08-27 DIAGNOSIS — D472 Monoclonal gammopathy: Secondary | ICD-10-CM | POA: Diagnosis not present

## 2013-08-27 LAB — CBC
HCT: 44.1 % (ref 39.0–52.0)
Hemoglobin: 15.3 g/dL (ref 13.0–17.0)
MCH: 32.1 pg (ref 26.0–34.0)
MCHC: 34.7 g/dL (ref 30.0–36.0)
MCV: 92.5 fL (ref 78.0–100.0)
Platelets: 249 10*3/uL (ref 150–400)
RBC: 4.77 MIL/uL (ref 4.22–5.81)
RDW: 13.4 % (ref 11.5–15.5)
WBC: 5.3 10*3/uL (ref 4.0–10.5)

## 2013-08-27 LAB — APTT: aPTT: 30 seconds (ref 24–37)

## 2013-08-27 LAB — PROTIME-INR
INR: 1.02 (ref 0.00–1.49)
Prothrombin Time: 13.2 seconds (ref 11.6–15.2)

## 2013-08-27 LAB — BONE MARROW EXAM

## 2013-08-27 MED ORDER — HYDROCODONE-ACETAMINOPHEN 5-325 MG PO TABS
1.0000 | ORAL_TABLET | ORAL | Status: DC | PRN
  Filled 2013-08-27: qty 2

## 2013-08-27 MED ORDER — FENTANYL CITRATE 0.05 MG/ML IJ SOLN
INTRAMUSCULAR | Status: AC
  Filled 2013-08-27: qty 6

## 2013-08-27 MED ORDER — MIDAZOLAM HCL 2 MG/2ML IJ SOLN
INTRAMUSCULAR | Status: AC
  Filled 2013-08-27: qty 6

## 2013-08-27 MED ORDER — SODIUM CHLORIDE 0.9 % IV SOLN
INTRAVENOUS | Status: DC
Start: 2013-08-27 — End: 2013-08-28
  Administered 2013-08-27: 10:00:00 via INTRAVENOUS

## 2013-08-27 MED ORDER — MIDAZOLAM HCL 2 MG/2ML IJ SOLN
INTRAMUSCULAR | Status: AC | PRN
  Administered 2013-08-27 (×2): 1 mg via INTRAVENOUS

## 2013-08-27 MED ORDER — FENTANYL CITRATE 0.05 MG/ML IJ SOLN
INTRAMUSCULAR | Status: AC | PRN
  Administered 2013-08-27 (×2): 25 ug via INTRAVENOUS

## 2013-08-27 NOTE — H&P (Signed)
SEVILLE DOWNS is an 68 y.o. male.   Chief Complaint: "I'm having another bone marrow biopsy" HPI: 68 year old gentleman with a plasma cell disorder manifesting itself with possible amyloidosis versus multiple myeloma. He has a documented IgA kappa monoclonal protein. His M spike of 1.05 and an IgA level of 970. He also has a lymph node biopsy documenting the presence of amyloidosis but prior bone marrow biopsy showed no evidence of amyloidosis and 20 % plasma cell infiltration.  He presents today for follow up  CT guided bone marrow biopsy prior to referral to [facility].   Past Medical History  Diagnosis Date  . Hyperlipidemia   . Dyspnea   . Prostatitis     acute and chronic  . Nephrolithiasis     Past Surgical History  Procedure Laterality Date  . Rotator cuff repair    . Appendectomy    . Elbow surgery  2003    right  . Colonoscopy  04/07/00  . Hernia repair      Family History  Problem Relation Age of Onset  . Cancer Father     pancreatic  . Pancreatic cancer Father   . Parkinsonism Mother    Social History:  reports that he quit smoking about 36 years ago. He has never used smokeless tobacco. He reports that he drinks alcohol. He reports that he does not use illicit drugs.  Allergies:  Allergies  Allergen Reactions  . Penicillins Swelling and Rash    Current outpatient prescriptions:Avanafil (STENDRA) 200 MG TABS, Take 200 mg by mouth every evening., Disp: , Rfl: ;  minoxidil (ROGAINE) 2 % external solution, Apply 1 application topically daily. , Disp: , Rfl: ;  Tamsulosin HCl (FLOMAX) 0.4 MG CAPS, Take 0.4 mg by mouth 2 (two) times daily. , Disp: , Rfl: ;  Testosterone (ANDROGEL PUMP TD), Place 3 sprays onto the skin daily. daily, Disp: , Rfl:  zolpidem (AMBIEN) 5 MG tablet, Take 2.5-5 mg by mouth at bedtime as needed for sleep., Disp: , Rfl: ;  fluconazole (DIFLUCAN) 200 MG tablet, Take 200 mg by mouth every 7 (seven) days., Disp: , Rfl:  Current  facility-administered medications:0.9 %  sodium chloride infusion, , Intravenous, Continuous, D Kevin Braiden Presutti, PA-C, Last Rate: 50 mL/hr at 08/27/13 0941;  fentaNYL (SUBLIMAZE) 0.05 MG/ML injection, , , , ;  midazolam (VERSED) 2 MG/2ML injection, , , ,    Results for orders placed during the hospital encounter of 08/27/13 (from the past 48 hour(s))  APTT     Status: None   Collection Time    08/27/13  9:40 AM      Result Value Ref Range   aPTT 30  24 - 37 seconds  CBC     Status: None   Collection Time    08/27/13  9:40 AM      Result Value Ref Range   WBC 5.3  4.0 - 10.5 K/uL   RBC 4.77  4.22 - 5.81 MIL/uL   Hemoglobin 15.3  13.0 - 17.0 g/dL   HCT 44.1  39.0 - 52.0 %   MCV 92.5  78.0 - 100.0 fL   MCH 32.1  26.0 - 34.0 pg   MCHC 34.7  30.0 - 36.0 g/dL   RDW 13.4  11.5 - 15.5 %   Platelets 249  150 - 400 K/uL  PROTIME-INR     Status: None   Collection Time    08/27/13  9:40 AM      Result Value Ref  Range   Prothrombin Time 13.2  11.6 - 15.2 seconds   INR 1.02  0.00 - 1.49   No results found.  Review of Systems  Constitutional: Negative for fever and chills.  Respiratory: Negative for cough and shortness of breath.   Cardiovascular: Negative for chest pain.  Gastrointestinal: Negative for nausea, vomiting and abdominal pain.  Genitourinary: Negative for dysuria and hematuria.  Musculoskeletal: Negative for back pain.  Neurological: Negative for headaches.  Endo/Heme/Allergies: Does not bruise/bleed easily.    Blood pressure 148/76, pulse 69, temperature 98 F (36.7 C), temperature source Oral, resp. rate 18, SpO2 95.00%. Physical Exam  Constitutional: He is oriented to person, place, and time. He appears well-developed and well-nourished.  Cardiovascular: Normal rate and regular rhythm.   Respiratory: Effort normal.  Few fine rt basilar crackles, left clear  GI: Soft. Bowel sounds are normal. There is no tenderness.  Musculoskeletal: Normal range of motion.   Neurological: He is alert and oriented to person, place, and time.     Assessment/Plan 68 year old gentleman with a plasma cell disorder manifesting itself with possible amyloidosis versus multiple myeloma. He has a documented IgA kappa monoclonal protein. His M spike of 1.05 and an IgA level of 970. He also has a lymph node biopsy documenting the presence of amyloidosis but prior bone marrow biopsy showed no evidence of amyloidosis and 20 % plasma cell infiltration.  He presents today for follow up  CT guided bone marrow biopsy prior to referral to [facility]. Details/risks of procedure d/w pt with his understanding and consent.  Lucy Woolever,D KEVIN 08/27/2013, 11:12 AM

## 2013-08-27 NOTE — Discharge Instructions (Signed)
Moderate Sedation, Adult °Moderate sedation is given to help you relax or even sleep through a procedure. You may remain sleepy, be clumsy, or have poor balance for several hours following this procedure. Arrange for a responsible adult, family member, or friend to take you home. A responsible adult should stay with you for at least 24 hours or until the medicines have worn off. °· Do not participate in any activities where you could become injured for the next 24 hours, or until you feel normal again. Do not: °· Drive. °· Swim. °· Ride a bicycle. °· Operate heavy machinery. °· Cook. °· Use power tools. °· Climb ladders. °· Work at heights. °· Do not make important decisions or sign legal documents until you are improved. °· Vomiting may occur if you eat too soon. When you can drink without vomiting, try water, juice, or soup. Try solid foods if you feel little or no nausea. °· Only take over-the-counter or prescription medications for pain, discomfort, or fever as directed by your caregiver.If pain medications have been prescribed for you, ask your caregiver how soon it is safe to take them. °· Make sure you and your family fully understands everything about the medication given to you. Make sure you understand what side effects may occur. °· You should not drink alcohol, take sleeping pills, or medications that cause drowsiness for at least 24 hours. °· If you smoke, do not smoke alone. °· If you are feeling better, you may resume normal activities 24 hours after receiving sedation. °· Keep all appointments as scheduled. Follow all instructions. °· Ask questions if you do not understand. °SEEK MEDICAL CARE IF:  °· Your skin is pale or bluish in color. °· You continue to feel sick to your stomach (nauseous) or throw up (vomit). °· Your pain is getting worse and not helped by medication. °· You have bleeding or swelling. °· You are still sleepy or feeling clumsy after 24 hours. °SEEK IMMEDIATE MEDICAL CARE IF:   °· You develop a rash. °· You have difficulty breathing. °· You develop any type of allergic problem. °· You have a fever. °Document Released: 11/24/2000 Document Revised: 05/24/2011 Document Reviewed: 11/06/2012 °ExitCare® Patient Information ©2014 [employer]. °Bone Marrow Aspiration, Bone Marrow Biopsy °Care After °Read the instructions outlined below and refer to this sheet in the next few weeks. These discharge instructions provide you with general information on caring for yourself after you leave the hospital. Your caregiver may also give you specific instructions. While your treatment has been planned according to the most current medical practices available, unavoidable complications occasionally occur. If you have any problems or questions after discharge, call your caregiver. °FINDING OUT THE RESULTS OF YOUR TEST °Not all test results are available during your visit. If your test results are not back during the visit, make an appointment with your caregiver to find out the results. Do not assume everything is normal if you have not heard from your caregiver or the medical facility. It is important for you to follow up on all of your test results.  °HOME CARE INSTRUCTIONS  °You have had sedation and may be sleepy or dizzy. Your thinking may not be as clear as usual. For the next 24 hours: °· Only take over-the-counter or prescription medicines for pain, discomfort, and or fever as directed by your caregiver. °· Do not drink alcohol. °· Do not smoke. °· Do not drive. °· Do not make important legal decisions. °· Do not operate heavy machinery. °·   Do not care for small children by yourself. °· Keep your dressing clean and dry. You may replace dressing with a bandage after 24 hours. °· You may take a bath or shower after 24 hours. °· Use an ice pack for 20 minutes every 2 hours while awake for pain as needed. °SEEK MEDICAL CARE IF:  °· There is redness, swelling, or increasing pain at the biopsy  site. °· There is pus coming from the biopsy site. °· There is drainage from a biopsy site lasting longer than one day. °· An unexplained oral temperature above 102° F (38.9° C) develops. °SEEK IMMEDIATE MEDICAL CARE IF:  °· You develop a rash. °· You have difficulty breathing. °· You develop any reaction or side effects to medications given. °Document Released: 09/18/2004 Document Revised: 05/24/2011 Document Reviewed: 02/27/2008 °ExitCare® Patient Information ©2014 [employer]. ° °

## 2013-08-27 NOTE — Procedures (Signed)
Interventional Radiology Procedure Note  Procedure: CT guided aspirate and core biopsy of right iliac bone Complications: None Recommendations: - Bedrest supine x 2 hrs - Hydrocodone PRN  Pain - Follow biopsy results  Signed,  Daielle Melcher K. Rishard Delange, MD [facility]   

## 2013-08-28 DIAGNOSIS — M25579 Pain in unspecified ankle and joints of unspecified foot: Secondary | ICD-10-CM | POA: Diagnosis not present

## 2013-08-29 ENCOUNTER — Ambulatory Visit (HOSPITAL_COMMUNITY): Payer: Medicare Other

## 2013-08-30 ENCOUNTER — Encounter (HOSPITAL_COMMUNITY)
Admission: RE | Admit: 2013-08-30 | Discharge: 2013-08-30 | Disposition: A | Discharge status: Home / Self Care | Payer: Medicare Other | Source: Ambulatory Visit | Attending: Oncology | Admitting: Oncology

## 2013-08-30 DIAGNOSIS — M25579 Pain in unspecified ankle and joints of unspecified foot: Secondary | ICD-10-CM | POA: Diagnosis not present

## 2013-08-30 DIAGNOSIS — R591 Generalized enlarged lymph nodes: Secondary | ICD-10-CM

## 2013-08-30 DIAGNOSIS — R599 Enlarged lymph nodes, unspecified: Secondary | ICD-10-CM | POA: Insufficient documentation

## 2013-08-30 DIAGNOSIS — E859 Amyloidosis, unspecified: Secondary | ICD-10-CM | POA: Diagnosis not present

## 2013-08-30 LAB — GLUCOSE, CAPILLARY: Glucose-Capillary: 123 mg/dL — ABNORMAL HIGH (ref 70–99)

## 2013-08-30 MED ORDER — FLUDEOXYGLUCOSE F - 18 (FDG) INJECTION
8.6000 | Freq: Once | INTRAVENOUS | Status: AC | PRN
  Administered 2013-08-30: 8.6 via INTRAVENOUS

## 2013-08-31 DIAGNOSIS — M25579 Pain in unspecified ankle and joints of unspecified foot: Secondary | ICD-10-CM | POA: Diagnosis not present

## 2013-09-03 DIAGNOSIS — M25579 Pain in unspecified ankle and joints of unspecified foot: Secondary | ICD-10-CM | POA: Diagnosis not present

## 2013-09-04 DIAGNOSIS — C9000 Multiple myeloma not having achieved remission: Secondary | ICD-10-CM | POA: Diagnosis not present

## 2013-09-04 DIAGNOSIS — Z5111 Encounter for antineoplastic chemotherapy: Secondary | ICD-10-CM | POA: Diagnosis not present

## 2013-09-04 DIAGNOSIS — E859 Amyloidosis, unspecified: Secondary | ICD-10-CM | POA: Diagnosis not present

## 2013-09-06 DIAGNOSIS — M25579 Pain in unspecified ankle and joints of unspecified foot: Secondary | ICD-10-CM | POA: Diagnosis not present

## 2013-09-07 LAB — CHROMOSOME ANALYSIS, BONE MARROW

## 2013-09-07 LAB — TISSUE HYBRIDIZATION (BONE MARROW)-NCBH

## 2013-09-10 DIAGNOSIS — M65979 Unspecified synovitis and tenosynovitis, unspecified ankle and foot: Secondary | ICD-10-CM | POA: Diagnosis not present

## 2013-09-10 DIAGNOSIS — M659 Synovitis and tenosynovitis, unspecified: Secondary | ICD-10-CM | POA: Diagnosis not present

## 2013-09-10 DIAGNOSIS — M25579 Pain in unspecified ankle and joints of unspecified foot: Secondary | ICD-10-CM | POA: Diagnosis not present

## 2013-09-17 DIAGNOSIS — E291 Testicular hypofunction: Secondary | ICD-10-CM | POA: Diagnosis not present

## 2013-09-21 DIAGNOSIS — M6789 Other specified disorders of synovium and tendon, multiple sites: Secondary | ICD-10-CM | POA: Diagnosis not present

## 2013-09-24 DIAGNOSIS — N529 Male erectile dysfunction, unspecified: Secondary | ICD-10-CM | POA: Diagnosis not present

## 2013-09-24 DIAGNOSIS — N401 Enlarged prostate with lower urinary tract symptoms: Secondary | ICD-10-CM | POA: Diagnosis not present

## 2013-09-24 DIAGNOSIS — E291 Testicular hypofunction: Secondary | ICD-10-CM | POA: Diagnosis not present

## 2013-09-24 DIAGNOSIS — N139 Obstructive and reflux uropathy, unspecified: Secondary | ICD-10-CM | POA: Diagnosis not present

## 2013-09-24 DIAGNOSIS — N138 Other obstructive and reflux uropathy: Secondary | ICD-10-CM | POA: Diagnosis not present

## 2013-09-28 DIAGNOSIS — G8929 Other chronic pain: Secondary | ICD-10-CM | POA: Diagnosis not present

## 2013-09-28 DIAGNOSIS — M6790 Unspecified disorder of synovium and tendon, unspecified site: Secondary | ICD-10-CM | POA: Diagnosis not present

## 2013-09-28 DIAGNOSIS — M659 Synovitis and tenosynovitis, unspecified: Secondary | ICD-10-CM | POA: Diagnosis not present

## 2013-09-28 DIAGNOSIS — M6789 Other specified disorders of synovium and tendon, multiple sites: Secondary | ICD-10-CM | POA: Diagnosis not present

## 2013-09-28 DIAGNOSIS — M2140 Flat foot [pes planus] (acquired), unspecified foot: Secondary | ICD-10-CM | POA: Diagnosis not present

## 2013-09-28 DIAGNOSIS — M65979 Unspecified synovitis and tenosynovitis, unspecified ankle and foot: Secondary | ICD-10-CM | POA: Diagnosis not present

## 2013-10-05 ENCOUNTER — Other Ambulatory Visit: Payer: Self-pay | Admitting: Oncology

## 2013-10-05 ENCOUNTER — Telehealth: Payer: Self-pay | Admitting: Oncology

## 2013-10-05 NOTE — Telephone Encounter (Signed)
s.w.l pt and advised on Aug appt..pt ok adn aware

## 2013-10-16 ENCOUNTER — Ambulatory Visit (INDEPENDENT_AMBULATORY_CARE_PROVIDER_SITE_OTHER): Payer: Medicare Other

## 2013-10-16 VITALS — BP 133/76 | HR 68 | Resp 12

## 2013-10-16 DIAGNOSIS — S96819A Strain of other specified muscles and tendons at ankle and foot level, unspecified foot, initial encounter: Secondary | ICD-10-CM | POA: Diagnosis not present

## 2013-10-16 DIAGNOSIS — R52 Pain, unspecified: Secondary | ICD-10-CM

## 2013-10-16 DIAGNOSIS — M216X9 Other acquired deformities of unspecified foot: Secondary | ICD-10-CM

## 2013-10-16 DIAGNOSIS — S93499A Sprain of other ligament of unspecified ankle, initial encounter: Secondary | ICD-10-CM

## 2013-10-16 DIAGNOSIS — M76829 Posterior tibial tendinitis, unspecified leg: Secondary | ICD-10-CM

## 2013-10-16 DIAGNOSIS — S86111A Strain of other muscle(s) and tendon(s) of posterior muscle group at lower leg level, right leg, initial encounter: Secondary | ICD-10-CM

## 2013-10-16 DIAGNOSIS — M6789 Other specified disorders of synovium and tendon, multiple sites: Secondary | ICD-10-CM

## 2013-10-16 DIAGNOSIS — M21071 Valgus deformity, not elsewhere classified, right ankle: Secondary | ICD-10-CM

## 2013-10-16 NOTE — Progress Notes (Signed)
   Subjective:    Patient ID: William Barron, male    DOB: 04/02/1945, 68 y.o.   MRN: [id number]  HPI  PT STATED RT FOOT ANKLE HAVE HISTORY TEAR TENDON AND IS BEEN HURTING FOR 5 MONTHS.  THE FOOT IS GETTING A LITTLE BETTER, BUT NOT GETTING WORSE. THE FOOT GET AGGRAVATED BY WALKING AND FIRST STEP  IN THE MORNING. TRIED WEARING THE ANKLE BRACE, INSERTS, AND PHYSICAL THERAPY BUT NO HELP. ALSO, HAVE AN MRI DONE 3 MONTHS AGO.    Review of Systems  Genitourinary: Positive for difficulty urinating.  Musculoskeletal: Positive for gait problem.  All other systems reviewed and are negative.      Objective:   Physical Exam 79-year-old white male well-developed well-nourished x3 presents this time with a several month history of dysfunction of his right foot and ankle status post an injury. He tried to jump cross something and injured his medial right ankle since that time has had complete collapse adductus deformity of the right foot and ankle. He was in the air fracture boot for a while however that irritated his ankle due to the collapse is wearing an ankle stabilizer type device and his head orthotics orthotics did not help and a stabilizer provided posse some improvement but not complete soft stabilizer.  Lower extremity objective findings as follows vascular status is intact pedal pulses palpable DP +2/4 PT plus one over 4 bilateral capillary refill x3 seconds all digits epicritic and proprioceptive sensations intact and symmetric bilateral there is normal plantar response DTRs not listed neurologically skin color pigment normal hair growth absent nails unremarkable orthopedic exam there is complete collapse of the medial column and abduction of the forefoot and midfoot. There is calcaneal valgus x-rays confirm mild medial malleolar spurring no signs of fracture or osseous abnormality significant promontory changes noted on lateral projection. Muscle testing patient complete absence of posterior  tibial tendon function left foot and ankle has normal motions in function the right foot has functions are normal except for the posterior tibial tendon. Cannot palpate the tendon all patient advised as a partial tear however I feel is complete tear at this time we'll still review an MRI that was provided with in 2-3 months ago.       Assessment & Plan:  Assessment this time posterior tibial tendon dysfunction/rupture of the posterior tibial tendon right ankle plan at this time has valgus deformity as result of the collapse of abnormal gait and difficulty capsulitis. Patient would benefit from an AFO-type device such as a Richie or [place] brace or is last resort consider triple arthrodesis procedure. A repair of the tendon would've been a possibility early on however at this point for 6 months later not likely have a tendon work with. We'll reappoint within the next week for followup and discussion and possibly casting for [place] brace.  Harriet Masson DPM

## 2013-10-16 NOTE — Patient Instructions (Signed)
Posterior Tibial Tendon Rupture with Rehab Tendons are soft tissues that connect muscle to bone. Tendons allow muscles to move the skeletal system. A complete tear of the posterior tibial tendon is known as a posterior tendon rupture. The posterior tibial tendon attaches the posterior muscles on the inner portion of the back of the lower leg (tibialis muscles) to foot. The posterior tibialis muscles help straighten (plantar flex) and rotate the foot inward (medially rotate) the foot. A posterior tibial rupture will result in a decreased ability to perform these tasks. SYMPTOMS   A "pop" or tear felt and/or heard in the area at the time of injury.  Pain, tenderness, inflammation, and/or bruising (contusion) around the medial inner side of the ankle.  Pain that worsens with dorsiflexion (opposite of plantar flexion) of the foot.  Decreased ankle strength.  Decrease in the prominence of the arch in the sole of the foot. CAUSES  Tendon ruptures occur when a force is placed on the tendon that is greater than it can withstand. Common mechanisms of injury include:  An event that places great stress on the tendon (jumping or starting a sprint).  Direct trauma to the ankle. RISK INCREASES WITH:  Sports that involve forceful and explosive plantar flexion (jumping or quick starts).  Poor strength and flexibility.  Previous injury to the posterior tibial tendon.  Corticosteroid injection into the posterior tibial tendon.  Obesity.  Poor vascular circulation. PREVENTION   Warm up and stretch properly before activity.  Allow for adequate recovery between workouts.  Maintain physical fitness:  Strength, flexibility, and endurance.  Cardiovascular fitness.  Maintain a healthy body weight.  Arch supports (orthotics), if you have flat feet.  Limit ankle movement by taping, wearing a brace, or using compression bandages. PROGNOSIS  In order to have the highest likelihood of returning  to your preinjury activity level, surgery is usually recommended, with 4 to 9 months of rehabilitation afterward.  RELATED COMPLICATIONS   Decreased ability to plantar flex.  Rerupture of the posterior tibial tendon.  Prolonged disability.  Flat feet.  Arthritis of the foot.  Risks of surgery: infection, bleeding, nerve damage, or damage to surrounding tissues. TREATMENT  Treatment initially involves resting from any activities that aggravate your symptoms. Ice, medication, and elevation can be used to help reduce pain and inflammation. In order to have the best results, surgery is recommended. Surgery involves sewing (suturing) the ends of the torn tendon back together. If your surgeon cannot repair the tendon, then a replacement (reconstruction) surgery may be performed to use another tendon to replace the function of the ruptured tendon. After surgery, the ankle is immobilized in order to allow the tendon to heal. After immobilization, it is important to perform strengthening and stretching exercises to help regain strength and a full range of motion. These exercises may be completed at home or with a therapist. MEDICATION   Nonsteroidal anti-inflammatory medications, such as aspirin and ibuprofen (do not take within 7 days before surgery), or other minor pain relievers, such as acetaminophen, are often recommended. Take these as directed by your caregiver. Contact your caregiver immediately if any bleeding, stomach upset, or signs of an allergic reaction occur.  Pain relievers may be prescribed as necessary by your caregiver. Use only as directed and only as much as you need. SEEK MEDICAL CARE IF:  Treatment seems to offer no benefit, or the condition worsens.  Any medications produce adverse side effects.  Any complications from surgery occur:  Pain, numbness, or  coldness in the extremity operated upon.  Discoloration of the nail beds (they become blue or gray) of the extremity  operated upon.  Signs of infections (fever, pain, inflammation, redness, or persistent bleeding). EXERCISES RANGE OF MOTION (ROM) AND STRETCHING EXERCISES - Posterior Tibial Tendon Rupture These exercises may help you when beginning to rehabilitate your injury. Your symptoms may resolve with or without further involvement from your physician, physical therapist, or athletic trainer. While completing these exercises, remember:   Restoring tissue flexibility helps normal motion to return to the joints. This allows healthier, less painful movement and activity.  An effective stretch should be held for at least 30 seconds.  A stretch should never be painful. You should only feel a gentle lengthening or release in the stretched tissue. STRETCH - Gastrocsoleus   Sit with your right / left leg extended. Holding onto both ends of a belt or towel, loop it around the ball of your foot.  Keeping your right / left ankle and foot relaxed and your knee straight, pull your foot and ankle toward you using the belt/towel.  You should feel a gentle stretch behind your calf or knee. Hold this position for __________ seconds. Repeat __________ times. Complete this stretch __________. times per day.  RANGE OF MOTION - Dorsi/Plantar Flexion  While sitting with your right / left knee straight, draw the top of your foot upwards by flexing your ankle. Then reverse the motion, pointing your toes downward.  Hold each position for __________ seconds.  After completing your first set of exercises, repeat this exercise with your knee bent. Repeat __________ times. Complete this exercise __________ times per day.  RANGE OF MOTION - Ankle Plantar Flexion   Sit with your right / left leg crossed over your opposite knee.  Use your opposite hand to pull the top of your foot and toes toward you.  You should feel a gentle stretch on the top of your foot/ankle. Hold this position for __________ seconds. Repeat  __________ times. Complete __________ times per day.  RANGE OF MOTION - Ankle Eversion   Sit with your right / left ankle crossed over your opposite knee.  Grip your foot with your opposite hand, placing your thumb on the top of your foot and your fingers across the bottom of your foot.  Gently push your foot downward with a slight rotation so your littlest toes rise slightly.  You should feel a gentle stretch on the inside of your ankle. Hold the stretch for __________ seconds. Repeat __________ times. Complete this exercise __________ times per day.  RANGE OF MOTION - Ankle Inversion   Sit with your right / left ankle crossed over your opposite knee.  Grip your foot with your opposite hand, placing your thumb on the bottom of your foot and your fingers across the top of your foot.  Gently pull your foot so the smallest toe comes toward you and your thumb pushes the inside of the ball of your foot away from you.  You should feel a gentle stretch on the outside of your ankle. Hold the stretch for __________ seconds. Repeat __________ times. Complete this exercise __________ times per day.  RANGE OF MOTION - Ankle Alphabet  Imagine your right / left big toe is a pen.  Keeping your hip and knee still, write out the entire alphabet with your "pen." Make the letters as large as you can without increasing any discomfort. Repeat __________ times. Complete this exercise __________ times per day.  STRENGTHENING EXERCISES - Posterior Tibial Tendon Rupture These exercises may help you when beginning to rehabilitate your injury. They may resolve your symptoms with or without further involvement from your physician, physical therapist, or athletic trainer. While completing these exercises, remember:   Muscles can gain both the endurance and the strength needed for everyday activities through controlled exercises.  Complete these exercises as instructed by your physician, physical therapist, or  athletic trainer. Progress the resistance and repetitions only as guided. STRENGTH - Dorsiflexors  Secure a rubber exercise band/tubing to a fixed object (table, pole) and loop the other end around your right / left foot.  Sit on the floor facing the fixed object. The band/tubing should be slightly tense when your foot is relaxed.  Slowly draw your foot back toward you using your ankle and toes.  Hold this position for __________ seconds. Slowly release the tension in the band and return your foot to the starting position. Repeat __________ times. Complete this exercise __________ times per day.  STRENGTH - Plantar Flexors   Sit with your right / left leg extended. Holding onto both ends of a rubber exercise band/tubing, loop it around the ball of your foot. Keep a slight tension in the band.  Slowly push your toes away from you, pointing them downward.  Hold this position for __________ seconds. Return slowly, controlling the tension in the band/tubing. Repeat __________ times. Complete this exercise __________ times per day.  STRENGTH - Towel Curls  Sit in a chair positioned on a non-carpeted surface.  Place your foot on a towel, keeping your heel on the floor.  Pull the towel toward your heel by only curling your toes. Keep your heel on the floor.  If instructed by your physician, physical therapist or athletic trainer, add ____________________ at the end of the towel. Repeat __________ times. Complete this exercise __________ times per day. STRENGTH - Ankle Inversion   Secure one end of a rubber exercise band/tubing to a fixed object (table, pole). Loop the other end around your foot just before your toes.  Place your fists between your knees. This will focus your strengthening at your ankle.  Slowly, pull your big toe up and in, making sure the band/tubing is positioned to resist the entire motion.  Hold this position for __________ seconds.  Have your muscles resist the  band/tubing as it slowly pulls your foot back to the starting position. Repeat __________ times. Complete this exercise __________ times per day.  Document Released: 03/01/2005 Document Revised: 07/16/2013 Document Reviewed: 06/13/2008 [facility] Patient Information 2015 [place], [place]. This information is not intended to replace advice given to you by your health care provider. Make sure you discuss any questions you have with your health care provider.      With a long-standing rupture complete rupture the tendon use of an external brace or what is called AFO device R. ankle-foot orthosis. The to braces I recommend are #1 Richie brace,  #2 [place] brace You go on line to look up information on both types of braces and decide which may be more mandible to your situation.  A last alternative would be surgical fusion of the subtalar and mid tarsus joints, the procedure called a triple arthrodesis to help stabilize the foot in a more straight position. This should be an absolute last resort or option.  Harriet Masson DPM

## 2013-10-18 ENCOUNTER — Telehealth: Payer: Self-pay | Admitting: Oncology

## 2013-10-18 ENCOUNTER — Encounter: Payer: Self-pay | Admitting: Oncology

## 2013-10-18 ENCOUNTER — Ambulatory Visit (HOSPITAL_BASED_OUTPATIENT_CLINIC_OR_DEPARTMENT_OTHER): Payer: Medicare Other | Admitting: Oncology

## 2013-10-18 VITALS — BP 152/88 | HR 75 | Temp 97.4°F | Resp 19 | Ht 66.0 in | Wt 164.9 lb

## 2013-10-18 DIAGNOSIS — C9030 Solitary plasmacytoma not having achieved remission: Secondary | ICD-10-CM | POA: Diagnosis not present

## 2013-10-18 DIAGNOSIS — E859 Amyloidosis, unspecified: Secondary | ICD-10-CM

## 2013-10-18 MED ORDER — DEXAMETHASONE 4 MG PO TABS: ORAL_TABLET | ORAL | Status: DC

## 2013-10-18 MED ORDER — CYCLOPHOSPHAMIDE 50 MG PO TABS: ORAL_TABLET | ORAL | Status: DC

## 2013-10-18 NOTE — Progress Notes (Signed)
Scripts for decadron and cytoxan given to ebony in managed care.

## 2013-10-18 NOTE — Progress Notes (Signed)
Hematology and Oncology Follow Up Visit  William Barron [id number] November 07, 1945 68 y.o. 10/18/2013 2:13 PM   Principle Diagnosis:  68 year old gentleman with a plasma cell disorder manifesting itself with  amyloidosis versus light chainmultiple myeloma. He has a documented IgA kappa monoclonal protein. His M spike of 1.05 and an IgA level of 970. He also has a lymph node biopsy documenting the presence of amyloidosis. He has close to 20% plasma cell infiltration in the bone marrow.   Current therapy: He is under evaluation for treatment options.  Interim History:  William Barron presents today for a followup visit. Since his last visit he continues to be asymptomatic. He have been under observation and surveillance at [facility] till about the recently. He had a repeat PET/CT scan in June of 2015 as well as a repeat bone marrow biopsy. His plasma cells saturation was slightly increased to 22%. His PET scan showed activity with diffuse lymphadenopathy. He continues to be asymptomatic. He denied any respiratory symptoms of congestive heart failure symptoms. Did not have any lower extremity edema or dyspnea on exertion. He did not have any bruisability or decline in his performance status. He continues to work full-time without any decline. He has not reported any headaches or blurry vision or double vision. Has not reported any changes in his mentation. He still have hearing difficulties but no neurological symptoms. He does not report any chest pain or shortness of breath. Is not reporting any nausea or vomiting or abdominal pain. Has not reported any hematochezia or melena. Is not report any urinary symptoms. Rest of his review of systems unremarkable.  Medications: I have reviewed the patient's current medications.  Current Outpatient Prescriptions  Medication Sig Dispense Refill  . Avanafil (STENDRA) 200 MG TABS Take 200 mg by mouth every evening.      . fluconazole (DIFLUCAN)  200 MG tablet Take 200 mg by mouth every 7 (seven) days.      . minoxidil (ROGAINE) 2 % external solution Apply 1 application topically daily.       . Tamsulosin HCl (FLOMAX) 0.4 MG CAPS Take 0.4 mg by mouth 2 (two) times daily.       . Testosterone (ANDROGEL PUMP TD) Place 3 sprays onto the skin daily. daily      . zolpidem (AMBIEN) 5 MG tablet Take 2.5-5 mg by mouth at bedtime as needed for sleep.      . cyclophosphamide (CYTOXAN) 50 MG tablet Take 11 tablets weekly on an empty stomach 1 hour before or 2 hours after meals.  50 tablet  2  . dexamethasone (DECADRON) 4 MG tablet Take 10 tablets weekly with Chemotherapy.  40 tablet  2   No current facility-administered medications for this visit.     Allergies:  Allergies  Allergen Reactions  . Penicillins Swelling and Rash    Past Medical History, Surgical history, Social history, and Family History were reviewed and updated.   Physical Exam: Blood pressure 152/88, pulse 75, temperature 97.4 F (36.3 C), temperature source Oral, resp. rate 19, height _0  (1.676 m), weight 164 lb 14.4 oz (74.798 kg), SpO2 98.00%. ECOG: 0 General appearance: alert, cooperative and appears stated age Head: Normocephalic, without obvious abnormality, atraumatic Neck: no adenopathy Lymph nodes: Cervical, supraclavicular, and axillary nodes normal. Heart:regular rate and rhythm, S1, S2 normal, no murmur, click, rub or gallop Lung:chest clear, no wheezing, rales, normal symmetric air entry Abdomin: soft, non-tender, without masses or organomegaly EXT:no erythema, induration, or nodules  Lab Results: Lab Results  Component Value Date   WBC 5.3 08/27/2013   HGB 15.3 08/27/2013   HCT 44.1 08/27/2013   MCV 92.5 08/27/2013   PLT 249 08/27/2013     Chemistry      Component Value Date/Time   NA 141 04/17/2013 1044   NA 141 03/29/2011 1448   K 4.1 04/17/2013 1044   K 3.6 03/29/2011 1448   CL 101 03/29/2011 1448   CO2 28 04/17/2013 1044   CO2 26 03/29/2011  1448   BUN 17.3 04/17/2013 1044   BUN 17 03/29/2011 1448   CREATININE 1.1 04/17/2013 1044   CREATININE 1.0 03/29/2011 1448      Component Value Date/Time   CALCIUM 9.8 04/17/2013 1044   CALCIUM 8.9 03/29/2011 1448   ALKPHOS 89 04/17/2013 1044   ALKPHOS 70 03/29/2011 1448   AST 16 04/17/2013 1044   AST 22 03/29/2011 1448   ALT 17 04/17/2013 1044   ALT 16 03/29/2011 1448   BILITOT 0.45 04/17/2013 1044   BILITOT 1.0 03/29/2011 1448     PET scan on 08/30/2013 IMPRESSION:  1. Mildly hypermetabolic mediastinal and hilar lymph nodes are  consistent with low-grade lymphoma.  2. Moderately hypermetabolic (greater than liver activity) right  common iliac, external iliac, and inguinal lymph nodes are also  consistent lymphoma.  3. No evidence of spleen involvement.  4. No evidence of marrow involvement.   Bone Marrow, Aspirate,Biopsy, and Clot, right iliac 08/27/2013.  - HYPERCELLULAR BONE MARROW FOR AGE WITH PLASMA CELL NEOPLASM (PLASMA CELLS 22%)  Impression and Plan:  68 year old gentleman with: 1. Plasma cell disorder. He presented with a inguinal lymph node that was biopsy proven to show amyloid and his laboratory testing showed a IgA kappa monoclonal protein with about 20% plasma cell infiltration in the bone marrow. He has been followed by Dr. Amalia Hailey since his diagnosis in March of 2015. Most recently, he was noted to have increase in his plasma cell infiltration on 08/27/2013 bone marrow. Also his PET scan showed some increased lymphadenopathy. Based on these findings, Dr. Amalia Hailey recommended starting treatments rather than continued observation. He recommended a regimen of Velcade at 1.5 mg per meter square subcutaneous with cyclophosphamide 300 mg per meter square orally as well as dexamethasone 40 mg orally all given weekly without any break.(Reader et al., Br J Haem 2014).  I reviewed these findings with William Barron as well as Dr. Amalia Hailey previously. The risks and benefits of this regimen were  discussed today extensively with William Barron. Complications include nausea, vomiting, myelosuppression, neutropenia, injection-related problems, peripheral neuropathy and renal toxicity. Complications with dexamethasone and include hyperglycemia and weight gain. He is willing to proceed I anticipate to start on 10/30/2013 after chemotherapy education class. We will follow him with monthly but these studies and he'll followup with Dr. Amalia Hailey in 3 months.  2. Nausea prophylaxis: We will antiemeticsfor him before the start of chemotherapy.   Zola Button, MD 8/6/20152:13 PM

## 2013-10-18 NOTE — Progress Notes (Signed)
Faxed cytoxan pa form to [facility]

## 2013-10-18 NOTE — Telephone Encounter (Signed)
gv adn rpnted aptp scehd and avs for pt for Aug/Sept adn OCT...sed added tx.

## 2013-10-19 ENCOUNTER — Telehealth: Payer: Self-pay | Admitting: *Deleted

## 2013-10-19 ENCOUNTER — Other Ambulatory Visit: Payer: Medicare Other

## 2013-10-19 ENCOUNTER — Telehealth: Payer: Self-pay | Admitting: Hematology & Oncology

## 2013-10-19 NOTE — Telephone Encounter (Signed)
Left pt message to call for appointments

## 2013-10-21 ENCOUNTER — Other Ambulatory Visit: Payer: Self-pay | Admitting: Hematology & Oncology

## 2013-10-21 DIAGNOSIS — E859 Amyloidosis, unspecified: Secondary | ICD-10-CM

## 2013-10-22 ENCOUNTER — Telehealth: Payer: Self-pay | Admitting: Hematology & Oncology

## 2013-10-22 NOTE — Telephone Encounter (Signed)
Pt was no show to 8-7 chemo edu. He scheduled 8-11.

## 2013-10-23 ENCOUNTER — Ambulatory Visit: Payer: Medicare Other

## 2013-10-23 ENCOUNTER — Other Ambulatory Visit: Payer: Self-pay | Admitting: *Deleted

## 2013-10-23 ENCOUNTER — Other Ambulatory Visit: Payer: Medicare Other

## 2013-10-23 ENCOUNTER — Encounter: Payer: Self-pay | Admitting: *Deleted

## 2013-10-30 ENCOUNTER — Encounter: Payer: Self-pay | Admitting: Hematology & Oncology

## 2013-10-30 ENCOUNTER — Ambulatory Visit (HOSPITAL_BASED_OUTPATIENT_CLINIC_OR_DEPARTMENT_OTHER): Payer: Medicare Other

## 2013-10-30 ENCOUNTER — Ambulatory Visit: Payer: Medicare Other

## 2013-10-30 ENCOUNTER — Other Ambulatory Visit (HOSPITAL_BASED_OUTPATIENT_CLINIC_OR_DEPARTMENT_OTHER): Payer: Medicare Other | Admitting: Lab

## 2013-10-30 ENCOUNTER — Other Ambulatory Visit: Payer: Medicare Other

## 2013-10-30 VITALS — BP 136/78 | HR 73 | Temp 97.2°F | Resp 20

## 2013-10-30 DIAGNOSIS — E859 Amyloidosis, unspecified: Secondary | ICD-10-CM

## 2013-10-30 DIAGNOSIS — Z5112 Encounter for antineoplastic immunotherapy: Secondary | ICD-10-CM | POA: Diagnosis not present

## 2013-10-30 LAB — CBC WITH DIFFERENTIAL (CANCER CENTER ONLY)
BASO#: 0 10*3/uL (ref 0.0–0.2)
BASO%: 0.4 % (ref 0.0–2.0)
EOS%: 4.3 % (ref 0.0–7.0)
Eosinophils Absolute: 0.2 10*3/uL (ref 0.0–0.5)
HCT: 49.7 % (ref 38.7–49.9)
HGB: 16.9 g/dL (ref 13.0–17.1)
LYMPH#: 1.5 10*3/uL (ref 0.9–3.3)
LYMPH%: 29.8 % (ref 14.0–48.0)
MCH: 32.7 pg (ref 28.0–33.4)
MCHC: 34 g/dL (ref 32.0–35.9)
MCV: 96 fL (ref 82–98)
MONO#: 0.5 10*3/uL (ref 0.1–0.9)
MONO%: 9 % (ref 0.0–13.0)
NEUT#: 2.9 10*3/uL (ref 1.5–6.5)
NEUT%: 56.5 % (ref 40.0–80.0)
Platelets: 265 10*3/uL (ref 145–400)
RBC: 5.17 10*6/uL (ref 4.20–5.70)
RDW: 14.1 % (ref 11.1–15.7)
WBC: 5.1 10*3/uL (ref 4.0–10.0)

## 2013-10-30 MED ORDER — ONDANSETRON HCL 8 MG PO TABS
8.0000 mg | ORAL_TABLET | Freq: Once | ORAL | Status: AC
  Administered 2013-10-30: 8 mg via ORAL

## 2013-10-30 MED ORDER — ONDANSETRON HCL 8 MG PO TABS
ORAL_TABLET | ORAL | Status: AC
  Filled 2013-10-30: qty 1

## 2013-10-30 MED ORDER — BORTEZOMIB CHEMO SQ INJECTION 3.5 MG (2.5MG/ML)
1.5000 mg/m2 | Freq: Once | INTRAMUSCULAR | Status: AC
  Administered 2013-10-30: 2.75 mg via SUBCUTANEOUS
  Filled 2013-10-30: qty 2.75

## 2013-10-30 NOTE — Patient Instructions (Signed)
Bortezomib injection What is this medicine? BORTEZOMIB (bor TEZ oh mib) is a chemotherapy drug. It slows the growth of cancer cells. This medicine is used to treat multiple myeloma, and certain lymphomas, such as mantle-cell lymphoma. This medicine may be used for other purposes; ask your health care provider or pharmacist if you have questions. COMMON BRAND NAME(S): Velcade What should I tell my health care provider before I take this medicine? They need to know if you have any of these conditions: -diabetes -heart disease -irregular heartbeat -liver disease -on hemodialysis -low blood counts, like low white blood cells, platelets, or hemoglobin -peripheral neuropathy -taking medicine for blood pressure -an unusual or allergic reaction to bortezomib, mannitol, boron, other medicines, foods, dyes, or preservatives -pregnant or trying to get pregnant -breast-feeding How should I use this medicine? This medicine is for injection into a vein or for injection under the skin. It is given by a health care professional in a hospital or clinic setting. Talk to your pediatrician regarding the use of this medicine in children. Special care may be needed. Overdosage: If you think you have taken too much of this medicine contact a poison control center or emergency room at once. NOTE: This medicine is only for you. Do not share this medicine with others. What if I miss a dose? It is important not to miss your dose. Call your doctor or health care professional if you are unable to keep an appointment. What may interact with this medicine? This medicine may interact with the following medications: -ketoconazole -rifampin -ritonavir -St. John's Wort This list may not describe all possible interactions. Give your health care provider a list of all the medicines, herbs, non-prescription drugs, or dietary supplements you use. Also tell them if you smoke, drink alcohol, or use illegal drugs. Some items  may interact with your medicine. What should I watch for while using this medicine? Visit your doctor for checks on your progress. This drug may make you feel generally unwell. This is not uncommon, as chemotherapy can affect healthy cells as well as cancer cells. Report any side effects. Continue your course of treatment even though you feel ill unless your doctor tells you to stop. You may get drowsy or dizzy. Do not drive, use machinery, or do anything that needs mental alertness until you know how this medicine affects you. Do not stand or sit up quickly, especially if you are an older patient. This reduces the risk of dizzy or fainting spells. In some cases, you may be given additional medicines to help with side effects. Follow all directions for their use. Call your doctor or health care professional for advice if you get a fever, chills or sore throat, or other symptoms of a cold or flu. Do not treat yourself. This drug decreases your body's ability to fight infections. Try to avoid being around people who are sick. This medicine may increase your risk to bruise or bleed. Call your doctor or health care professional if you notice any unusual bleeding. You may need blood work done while you are taking this medicine. In some patients, this medicine may cause a serious brain infection that may cause death. If you have any problems seeing, thinking, speaking, walking, or standing, tell your doctor right away. If you cannot reach your doctor, urgently seek other source of medical care. Do not become pregnant while taking this medicine. Women should inform their doctor if they wish to become pregnant or think they might be pregnant. There is   a potential for serious side effects to an unborn child. Talk to your health care professional or pharmacist for more information. Do not breast-feed an infant while taking this medicine. Check with your doctor or health care professional if you get an attack of  severe diarrhea, nausea and vomiting, or if you sweat a lot. The loss of too much body fluid can make it dangerous for you to take this medicine. What side effects may I notice from receiving this medicine? Side effects that you should report to your doctor or health care professional as soon as possible: -allergic reactions like skin rash, itching or hives, swelling of the face, lips, or tongue -breathing problems -changes in hearing -changes in vision -fast, irregular heartbeat -feeling faint or lightheaded, falls -pain, tingling, numbness in the hands or feet -right upper belly pain -seizures -swelling of the ankles, feet, hands -unusual bleeding or bruising -unusually weak or tired -vomiting -yellowing of the eyes or skin Side effects that usually do not require medical attention (report to your doctor or health care professional if they continue or are bothersome): -changes in emotions or moods -constipation -diarrhea -loss of appetite -headache -irritation at site where injected -nausea This list may not describe all possible side effects. Call your doctor for medical advice about side effects. You may report side effects to FDA at 1-800-FDA-1088. Where should I keep my medicine? This drug is given in a hospital or clinic and will not be stored at home. NOTE: This sheet is a summary. It may not cover all possible information. If you have questions about this medicine, talk to your doctor, pharmacist, or health care provider.  2015, [employer] Standard. (2012-12-25 12:46:32)  

## 2013-11-01 LAB — COMPREHENSIVE METABOLIC PANEL
ALT: 16 U/L (ref 0–53)
AST: 16 U/L (ref 0–37)
Albumin: 4.4 g/dL (ref 3.5–5.2)
Alkaline Phosphatase: 84 U/L (ref 39–117)
BUN: 18 mg/dL (ref 6–23)
CO2: 25 mEq/L (ref 19–32)
Calcium: 9.5 mg/dL (ref 8.4–10.5)
Chloride: 104 mEq/L (ref 96–112)
Creatinine, Ser: 1.01 mg/dL (ref 0.50–1.35)
Glucose, Bld: 116 mg/dL — ABNORMAL HIGH (ref 70–99)
Potassium: 4.2 mEq/L (ref 3.5–5.3)
Sodium: 138 mEq/L (ref 135–145)
Total Bilirubin: 0.5 mg/dL (ref 0.2–1.2)
Total Protein: 7.3 g/dL (ref 6.0–8.3)

## 2013-11-01 LAB — SPEP & IFE WITH QIG
Albumin ELP: 55.2 % — ABNORMAL LOW (ref 55.8–66.1)
Alpha-1-Globulin: 6.8 % — ABNORMAL HIGH (ref 2.9–4.9)
Alpha-2-Globulin: 8.4 % (ref 7.1–11.8)
Beta 2: 3.8 % (ref 3.2–6.5)
Beta Globulin: 5.9 % (ref 4.7–7.2)
Gamma Globulin: 19.9 % — ABNORMAL HIGH (ref 11.1–18.8)
IgA: 1010 mg/dL — ABNORMAL HIGH (ref 68–379)
IgG (Immunoglobin G), Serum: 556 mg/dL — ABNORMAL LOW (ref 650–1600)
IgM, Serum: 14 mg/dL — ABNORMAL LOW (ref 41–251)
M-Spike, %: 1.04 g/dL
Total Protein, Serum Electrophoresis: 7.3 g/dL (ref 6.0–8.3)

## 2013-11-01 LAB — KAPPA/LAMBDA LIGHT CHAINS
Kappa free light chain: 6.32 mg/dL — ABNORMAL HIGH (ref 0.33–1.94)
Kappa:Lambda Ratio: 6.95 — ABNORMAL HIGH (ref 0.26–1.65)
Lambda Free Lght Chn: 0.91 mg/dL (ref 0.57–2.63)

## 2013-11-02 ENCOUNTER — Ambulatory Visit: Payer: Medicare Other

## 2013-11-06 ENCOUNTER — Ambulatory Visit: Payer: Medicare Other

## 2013-11-06 ENCOUNTER — Other Ambulatory Visit (HOSPITAL_BASED_OUTPATIENT_CLINIC_OR_DEPARTMENT_OTHER): Payer: Medicare Other | Admitting: Lab

## 2013-11-06 ENCOUNTER — Other Ambulatory Visit: Payer: Medicare Other

## 2013-11-06 ENCOUNTER — Ambulatory Visit (HOSPITAL_BASED_OUTPATIENT_CLINIC_OR_DEPARTMENT_OTHER): Payer: Medicare Other

## 2013-11-06 VITALS — BP 128/81 | HR 68 | Temp 96.4°F | Resp 12

## 2013-11-06 DIAGNOSIS — E859 Amyloidosis, unspecified: Secondary | ICD-10-CM

## 2013-11-06 DIAGNOSIS — Z5112 Encounter for antineoplastic immunotherapy: Secondary | ICD-10-CM

## 2013-11-06 LAB — CBC WITH DIFFERENTIAL (CANCER CENTER ONLY)
BASO#: 0 10*3/uL (ref 0.0–0.2)
BASO%: 0.6 % (ref 0.0–2.0)
EOS%: 3.4 % (ref 0.0–7.0)
Eosinophils Absolute: 0.2 10*3/uL (ref 0.0–0.5)
HCT: 46.8 % (ref 38.7–49.9)
HGB: 16.3 g/dL (ref 13.0–17.1)
LYMPH#: 1.8 10*3/uL (ref 0.9–3.3)
LYMPH%: 33.6 % (ref 14.0–48.0)
MCH: 32.8 pg (ref 28.0–33.4)
MCHC: 34.8 g/dL (ref 32.0–35.9)
MCV: 94 fL (ref 82–98)
MONO#: 0.4 10*3/uL (ref 0.1–0.9)
MONO%: 8.1 % (ref 0.0–13.0)
NEUT#: 2.9 10*3/uL (ref 1.5–6.5)
NEUT%: 54.3 % (ref 40.0–80.0)
Platelets: 245 10*3/uL (ref 145–400)
RBC: 4.97 10*6/uL (ref 4.20–5.70)
RDW: 13.5 % (ref 11.1–15.7)
WBC: 5.3 10*3/uL (ref 4.0–10.0)

## 2013-11-06 LAB — CMP (CANCER CENTER ONLY)
ALT(SGPT): 13 U/L (ref 10–47)
AST: 16 U/L (ref 11–38)
Albumin: 3.5 g/dL (ref 3.3–5.5)
Alkaline Phosphatase: 75 U/L (ref 26–84)
BUN, Bld: 20 mg/dL (ref 7–22)
CO2: 24 mEq/L (ref 18–33)
Calcium: 9 mg/dL (ref 8.0–10.3)
Chloride: 105 mEq/L (ref 98–108)
Creat: 1 mg/dl (ref 0.6–1.2)
Glucose, Bld: 105 mg/dL (ref 73–118)
Potassium: 3.6 mEq/L (ref 3.3–4.7)
Sodium: 137 mEq/L (ref 128–145)
Total Bilirubin: 0.6 mg/dl (ref 0.20–1.60)
Total Protein: 7.3 g/dL (ref 6.4–8.1)

## 2013-11-06 MED ORDER — ONDANSETRON HCL 8 MG PO TABS
8.0000 mg | ORAL_TABLET | Freq: Once | ORAL | Status: AC
  Administered 2013-11-06: 8 mg via ORAL

## 2013-11-06 MED ORDER — BORTEZOMIB CHEMO SQ INJECTION 3.5 MG (2.5MG/ML)
1.5000 mg/m2 | Freq: Once | INTRAMUSCULAR | Status: AC
  Administered 2013-11-06: 2.75 mg via SUBCUTANEOUS
  Filled 2013-11-06: qty 2.75

## 2013-11-06 MED ORDER — ONDANSETRON HCL 8 MG PO TABS
ORAL_TABLET | ORAL | Status: AC
  Filled 2013-11-06: qty 1

## 2013-11-06 NOTE — Patient Instructions (Signed)
Bortezomib injection What is this medicine? BORTEZOMIB (bor TEZ oh mib) is a chemotherapy drug. It slows the growth of cancer cells. This medicine is used to treat multiple myeloma, and certain lymphomas, such as mantle-cell lymphoma. This medicine may be used for other purposes; ask your health care provider or pharmacist if you have questions. COMMON BRAND NAME(S): Velcade What should I tell my health care provider before I take this medicine? They need to know if you have any of these conditions: -diabetes -heart disease -irregular heartbeat -liver disease -on hemodialysis -low blood counts, like low white blood cells, platelets, or hemoglobin -peripheral neuropathy -taking medicine for blood pressure -an unusual or allergic reaction to bortezomib, mannitol, boron, other medicines, foods, dyes, or preservatives -pregnant or trying to get pregnant -breast-feeding How should I use this medicine? This medicine is for injection into a vein or for injection under the skin. It is given by a health care professional in a hospital or clinic setting. Talk to your pediatrician regarding the use of this medicine in children. Special care may be needed. Overdosage: If you think you have taken too much of this medicine contact a poison control center or emergency room at once. NOTE: This medicine is only for you. Do not share this medicine with others. What if I miss a dose? It is important not to miss your dose. Call your doctor or health care professional if you are unable to keep an appointment. What may interact with this medicine? This medicine may interact with the following medications: -ketoconazole -rifampin -ritonavir -St. John's Wort This list may not describe all possible interactions. Give your health care provider a list of all the medicines, herbs, non-prescription drugs, or dietary supplements you use. Also tell them if you smoke, drink alcohol, or use illegal drugs. Some items  may interact with your medicine. What should I watch for while using this medicine? Visit your doctor for checks on your progress. This drug may make you feel generally unwell. This is not uncommon, as chemotherapy can affect healthy cells as well as cancer cells. Report any side effects. Continue your course of treatment even though you feel ill unless your doctor tells you to stop. You may get drowsy or dizzy. Do not drive, use machinery, or do anything that needs mental alertness until you know how this medicine affects you. Do not stand or sit up quickly, especially if you are an older patient. This reduces the risk of dizzy or fainting spells. In some cases, you may be given additional medicines to help with side effects. Follow all directions for their use. Call your doctor or health care professional for advice if you get a fever, chills or sore throat, or other symptoms of a cold or flu. Do not treat yourself. This drug decreases your body's ability to fight infections. Try to avoid being around people who are sick. This medicine may increase your risk to bruise or bleed. Call your doctor or health care professional if you notice any unusual bleeding. You may need blood work done while you are taking this medicine. In some patients, this medicine may cause a serious brain infection that may cause death. If you have any problems seeing, thinking, speaking, walking, or standing, tell your doctor right away. If you cannot reach your doctor, urgently seek other source of medical care. Do not become pregnant while taking this medicine. Women should inform their doctor if they wish to become pregnant or think they might be pregnant. There is   a potential for serious side effects to an unborn child. Talk to your health care professional or pharmacist for more information. Do not breast-feed an infant while taking this medicine. Check with your doctor or health care professional if you get an attack of  severe diarrhea, nausea and vomiting, or if you sweat a lot. The loss of too much body fluid can make it dangerous for you to take this medicine. What side effects may I notice from receiving this medicine? Side effects that you should report to your doctor or health care professional as soon as possible: -allergic reactions like skin rash, itching or hives, swelling of the face, lips, or tongue -breathing problems -changes in hearing -changes in vision -fast, irregular heartbeat -feeling faint or lightheaded, falls -pain, tingling, numbness in the hands or feet -right upper belly pain -seizures -swelling of the ankles, feet, hands -unusual bleeding or bruising -unusually weak or tired -vomiting -yellowing of the eyes or skin Side effects that usually do not require medical attention (report to your doctor or health care professional if they continue or are bothersome): -changes in emotions or moods -constipation -diarrhea -loss of appetite -headache -irritation at site where injected -nausea This list may not describe all possible side effects. Call your doctor for medical advice about side effects. You may report side effects to FDA at 1-800-FDA-1088. Where should I keep my medicine? This drug is given in a hospital or clinic and will not be stored at home. NOTE: This sheet is a summary. It may not cover all possible information. If you have questions about this medicine, talk to your doctor, pharmacist, or health care provider.  2015, [employer] Standard. (2012-12-25 12:46:32)  

## 2013-11-09 ENCOUNTER — Ambulatory Visit: Payer: Medicare Other

## 2013-11-13 ENCOUNTER — Ambulatory Visit (HOSPITAL_BASED_OUTPATIENT_CLINIC_OR_DEPARTMENT_OTHER): Payer: Medicare Other

## 2013-11-13 ENCOUNTER — Other Ambulatory Visit: Payer: Self-pay | Admitting: *Deleted

## 2013-11-13 ENCOUNTER — Other Ambulatory Visit: Payer: Medicare Other

## 2013-11-13 ENCOUNTER — Telehealth: Payer: Self-pay | Admitting: Hematology & Oncology

## 2013-11-13 ENCOUNTER — Ambulatory Visit: Payer: Medicare Other

## 2013-11-13 ENCOUNTER — Other Ambulatory Visit (HOSPITAL_BASED_OUTPATIENT_CLINIC_OR_DEPARTMENT_OTHER): Payer: Medicare Other | Admitting: Lab

## 2013-11-13 VITALS — BP 136/82 | HR 71 | Temp 97.6°F | Resp 20

## 2013-11-13 DIAGNOSIS — E859 Amyloidosis, unspecified: Secondary | ICD-10-CM

## 2013-11-13 DIAGNOSIS — Z5112 Encounter for antineoplastic immunotherapy: Secondary | ICD-10-CM

## 2013-11-13 DIAGNOSIS — C9030 Solitary plasmacytoma not having achieved remission: Secondary | ICD-10-CM | POA: Diagnosis not present

## 2013-11-13 DIAGNOSIS — D489 Neoplasm of uncertain behavior, unspecified: Secondary | ICD-10-CM

## 2013-11-13 DIAGNOSIS — R110 Nausea: Secondary | ICD-10-CM

## 2013-11-13 DIAGNOSIS — D472 Monoclonal gammopathy: Secondary | ICD-10-CM

## 2013-11-13 LAB — COMPREHENSIVE METABOLIC PANEL
ALT: 13 U/L (ref 0–53)
AST: 14 U/L (ref 0–37)
Albumin: 3.8 g/dL (ref 3.5–5.2)
Alkaline Phosphatase: 70 U/L (ref 39–117)
BUN: 22 mg/dL (ref 6–23)
CO2: 26 mEq/L (ref 19–32)
Calcium: 9.3 mg/dL (ref 8.4–10.5)
Chloride: 105 mEq/L (ref 96–112)
Creatinine, Ser: 1.14 mg/dL (ref 0.50–1.35)
Glucose, Bld: 98 mg/dL (ref 70–99)
Potassium: 4 mEq/L (ref 3.5–5.3)
Sodium: 138 mEq/L (ref 135–145)
Total Bilirubin: 0.4 mg/dL (ref 0.2–1.2)
Total Protein: 6.6 g/dL (ref 6.0–8.3)

## 2013-11-13 LAB — CBC WITH DIFFERENTIAL (CANCER CENTER ONLY)
BASO#: 0 10*3/uL (ref 0.0–0.2)
BASO%: 0.4 % (ref 0.0–2.0)
EOS%: 2.5 % (ref 0.0–7.0)
Eosinophils Absolute: 0.2 10*3/uL (ref 0.0–0.5)
HCT: 45 % (ref 38.7–49.9)
HGB: 15.4 g/dL (ref 13.0–17.1)
LYMPH#: 2.1 10*3/uL (ref 0.9–3.3)
LYMPH%: 30.7 % (ref 14.0–48.0)
MCH: 32.4 pg (ref 28.0–33.4)
MCHC: 34.2 g/dL (ref 32.0–35.9)
MCV: 95 fL (ref 82–98)
MONO#: 0.4 10*3/uL (ref 0.1–0.9)
MONO%: 6.1 % (ref 0.0–13.0)
NEUT#: 4.1 10*3/uL (ref 1.5–6.5)
NEUT%: 60.3 % (ref 40.0–80.0)
Platelets: 231 10*3/uL (ref 145–400)
RBC: 4.75 10*6/uL (ref 4.20–5.70)
RDW: 13.4 % (ref 11.1–15.7)
WBC: 6.8 10*3/uL (ref 4.0–10.0)

## 2013-11-13 MED ORDER — ONDANSETRON HCL 8 MG PO TABS
ORAL_TABLET | ORAL | Status: AC
  Filled 2013-11-13: qty 1

## 2013-11-13 MED ORDER — PROCHLORPERAZINE MALEATE 10 MG PO TABS: 10.0000 mg | ORAL_TABLET | Freq: Four times a day (QID) | ORAL | Status: DC | PRN

## 2013-11-13 MED ORDER — ONDANSETRON HCL 8 MG PO TABS
8.0000 mg | ORAL_TABLET | Freq: Once | ORAL | Status: AC
  Administered 2013-11-13: 8 mg via ORAL

## 2013-11-13 MED ORDER — BORTEZOMIB CHEMO SQ INJECTION 3.5 MG (2.5MG/ML)
1.5000 mg/m2 | Freq: Once | INTRAMUSCULAR | Status: AC
  Administered 2013-11-13: 2.75 mg via SUBCUTANEOUS
  Filled 2013-11-13: qty 2.75

## 2013-11-13 NOTE — Telephone Encounter (Signed)
Per Dr.Ennever and Dorian Pod to cx 11/20/13 apt in [place] with Dr.Shadad due to patient transferring his care to [place] and to sch patient for 11/20/13 here.  Ok per [place] and Md to double sch.also.  Patient is aware of his apt date and time

## 2013-11-13 NOTE — Progress Notes (Signed)
Pt presents with questions regarding how many capsules of Cyclophosphamide 50 mg he is to take. Rx bottle states 11 but patient has only been taking one capsule per week.  Dr. Marin Olp notified, clarified Rx with patient, verbalized understanding.

## 2013-11-13 NOTE — Patient Instructions (Signed)
Cyclophosphamide tablets What is this medicine? CYCLOPHOSPHAMIDE (sye kloe FOSS fa mide) is a chemotherapy drug. It slows the growth of cancer cells. This medicine is used to treat many types of cancer like lymphoma, myeloma, leukemia, breast cancer, and ovarian cancer, to name a few. It is also used to treat nephrotic syndrome in children. This medicine may be used for other purposes; ask your health care provider or pharmacist if you have questions. COMMON BRAND NAME(S): Cytoxan What should I tell my health care provider before I take this medicine? They need to know if you have any of these conditions: -blood disorders -infection -kidney disease -liver disease -recent or ongoing radiation therapy -an unusual or allergic reaction to cyclophosphamide, other chemotherapy, other medicines, foods, dyes, or preservatives -pregnant or trying to get pregnant -breast-feeding How should I use this medicine? Take this medicine by mouth with a glass of water. Follow the directions on the prescription label. Do not cut, crush or chew this medicine. Take your medicine at regular intervals. Do not take it more often than directed. Do not stop taking except on your doctor's advice. Talk to your pediatrician regarding the use of this medicine in children. Special care may be needed. Overdosage: If you think you have taken too much of this medicine contact a poison control center or emergency room at once. NOTE: This medicine is only for you. Do not share this medicine with others. What if I miss a dose? If you miss a dose, take it as soon as you can. If it is almost time for your next dose, take only that dose. Do not take double or extra doses. What may interact with this medicine? This medicine may interact with the following medications: -amiodarone -amphotericin B -azathioprine -certain antiviral medicines for HIV or AIDS such as protease inhibitors (e.g., indinavir, ritonavir) and  zidovudine -certain blood pressure medications such as benazepril, captopril, enalapril, fosinopril, lisinopril, moexipril, monopril, perindopril, quinapril, ramipril, trandolapril -certain cancer medications such as anthracyclines (e.g., daunorubicin, doxorubicin), busulfan, cytarabine, paclitaxel, pentostatin, tamoxifen, trastuzumab -certain diuretics such as chlorothiazide, chlorthalidone, hydrochlorothiazide, indapamide, metolazone -certain medicines that treat or prevent blood clots like warfarin -certain muscle relaxants such as succinylcholine -cyclosporine -etanercept -indomethacin -medicines to increase blood counts like filgrastim, pegfilgrastim, sargramostim -medicines used as general anesthesia -metronidazole -natalizumab This list may not describe all possible interactions. Give your health care provider a list of all the medicines, herbs, non-prescription drugs, or dietary supplements you use. Also tell them if you smoke, drink alcohol, or use illegal drugs. Some items may interact with your medicine. What should I watch for while using this medicine? Visit your doctor for checks on your progress. This drug may make you feel generally unwell. This is not uncommon, as chemotherapy can affect healthy cells as well as cancer cells. Report any side effects. Continue your course of treatment even though you feel ill unless your doctor tells you to stop. Drink water or other fluids as directed. Urinate often, even at night. In some cases, you may be given additional medicines to help with side effects. Follow all directions for their use. Call your doctor or health care professional for advice if you get a fever, chills or sore throat, or other symptoms of a cold or flu. Do not treat yourself. This drug decreases your body's ability to fight infections. Try to avoid being around people who are sick. This medicine may increase your risk to bruise or bleed. Call your doctor or health care  professional if you  notice any unusual bleeding. Be careful brushing and flossing your teeth or using a toothpick because you may get an infection or bleed more easily. If you have any dental work done, tell your dentist you are receiving this medicine. You may get drowsy or dizzy. Do not drive, use machinery, or do anything that needs mental alertness until you know how this medicine affects you. Do not become pregnant while taking this medicine or for 1 year after stopping it. Women should inform their doctor if they wish to become pregnant or think they might be pregnant. Men should not father a child while taking this medicine and for 4 months after stopping it. There is a potential for serious side effects to an unborn child. Talk to your health care professional or pharmacist for more information. Do not breast-feed an infant while taking this medicine. This medicine may interfere with the ability to have a child. This medicine has caused ovarian failure in some women. This medicine has caused reduced sperm counts in some men. You should talk with your doctor or health care professional if you are concerned about your fertility. If you are going to have surgery, tell your doctor or health care professional that you have taken this medicine. What side effects may I notice from receiving this medicine? Side effects that you should report to your doctor or health care professional as soon as possible: -allergic reactions like skin rash, itching or hives, swelling of the face, lips, or tongue -low blood counts - this medicine may decrease the number of white blood cells, red blood cells and platelets. You may be at increased risk for infections and bleeding. -signs of infection - fever or chills, cough, sore throat, pain or difficulty passing urine -signs of decreased platelets or bleeding - bruising, pinpoint red spots on the skin, black, tarry stools, blood in the urine -signs of decreased red blood  cells - unusually weak or tired, fainting spells, lightheadedness -breathing problems -dark urine -dizziness -palpitations -swelling of the ankles, feet, hands -trouble passing urine or change in the amount of urine -weight gain -yellowing of the eyes or skin Side effects that usually do not require medical attention (report to your doctor or health care professional if they continue or are bothersome): -changes in nail or skin color -hair loss -missed menstrual periods -mouth sores -nausea, vomiting This list may not describe all possible side effects. Call your doctor for medical advice about side effects. You may report side effects to FDA at 1-800-FDA-1088. Where should I keep my medicine? Keep out of the reach of children. Store at room temperature at or below 25 degrees C (77 degrees F). This medicine can be stored at room temperatures of up to 30 degrees C (86 degrees F) for a short time. Protect from temperatures above 30 degrees C (86 degrees F). Throw away any unused medicine after the expiration date. NOTE: This sheet is a summary. It may not cover all possible information. If you have questions about this medicine, talk to your doctor, pharmacist, or health care provider.  2015, [employer]. (2012-01-14 16:42:57) Bortezomib injection What is this medicine? BORTEZOMIB (bor TEZ oh mib) is a chemotherapy drug. It slows the growth of cancer cells. This medicine is used to treat multiple myeloma, and certain lymphomas, such as mantle-cell lymphoma. This medicine may be used for other purposes; ask your health care provider or pharmacist if you have questions. COMMON BRAND NAME(S): Velcade What should I tell my health care provider  before I take this medicine? They need to know if you have any of these conditions: -diabetes -heart disease -irregular heartbeat -liver disease -on hemodialysis -low blood counts, like low white blood cells, platelets, or  hemoglobin -peripheral neuropathy -taking medicine for blood pressure -an unusual or allergic reaction to bortezomib, mannitol, boron, other medicines, foods, dyes, or preservatives -pregnant or trying to get pregnant -breast-feeding How should I use this medicine? This medicine is for injection into a vein or for injection under the skin. It is given by a health care professional in a hospital or clinic setting. Talk to your pediatrician regarding the use of this medicine in children. Special care may be needed. Overdosage: If you think you have taken too much of this medicine contact a poison control center or emergency room at once. NOTE: This medicine is only for you. Do not share this medicine with others. What if I miss a dose? It is important not to miss your dose. Call your doctor or health care professional if you are unable to keep an appointment. What may interact with this medicine? This medicine may interact with the following medications: -ketoconazole -rifampin -ritonavir -St. John's Wort This list may not describe all possible interactions. Give your health care provider a list of all the medicines, herbs, non-prescription drugs, or dietary supplements you use. Also tell them if you smoke, drink alcohol, or use illegal drugs. Some items may interact with your medicine. What should I watch for while using this medicine? Visit your doctor for checks on your progress. This drug may make you feel generally unwell. This is not uncommon, as chemotherapy can affect healthy cells as well as cancer cells. Report any side effects. Continue your course of treatment even though you feel ill unless your doctor tells you to stop. You may get drowsy or dizzy. Do not drive, use machinery, or do anything that needs mental alertness until you know how this medicine affects you. Do not stand or sit up quickly, especially if you are an older patient. This reduces the risk of dizzy or fainting  spells. In some cases, you may be given additional medicines to help with side effects. Follow all directions for their use. Call your doctor or health care professional for advice if you get a fever, chills or sore throat, or other symptoms of a cold or flu. Do not treat yourself. This drug decreases your body's ability to fight infections. Try to avoid being around people who are sick. This medicine may increase your risk to bruise or bleed. Call your doctor or health care professional if you notice any unusual bleeding. You may need blood work done while you are taking this medicine. In some patients, this medicine may cause a serious brain infection that may cause death. If you have any problems seeing, thinking, speaking, walking, or standing, tell your doctor right away. If you cannot reach your doctor, urgently seek other source of medical care. Do not become pregnant while taking this medicine. Women should inform their doctor if they wish to become pregnant or think they might be pregnant. There is a potential for serious side effects to an unborn child. Talk to your health care professional or pharmacist for more information. Do not breast-feed an infant while taking this medicine. Check with your doctor or health care professional if you get an attack of severe diarrhea, nausea and vomiting, or if you sweat a lot. The loss of too much body fluid can make it dangerous for  you to take this medicine. What side effects may I notice from receiving this medicine? Side effects that you should report to your doctor or health care professional as soon as possible: -allergic reactions like skin rash, itching or hives, swelling of the face, lips, or tongue -breathing problems -changes in hearing -changes in vision -fast, irregular heartbeat -feeling faint or lightheaded, falls -pain, tingling, numbness in the hands or feet -right upper belly pain -seizures -swelling of the ankles, feet,  hands -unusual bleeding or bruising -unusually weak or tired -vomiting -yellowing of the eyes or skin Side effects that usually do not require medical attention (report to your doctor or health care professional if they continue or are bothersome): -changes in emotions or moods -constipation -diarrhea -loss of appetite -headache -irritation at site where injected -nausea This list may not describe all possible side effects. Call your doctor for medical advice about side effects. You may report side effects to FDA at 1-800-FDA-1088. Where should I keep my medicine? This drug is given in a hospital or clinic and will not be stored at home. NOTE: This sheet is a summary. It may not cover all possible information. If you have questions about this medicine, talk to your doctor, pharmacist, or health care provider.  2015, [employer]/Gold Standard. (2012-12-25 12:46:32)

## 2013-11-20 ENCOUNTER — Encounter: Payer: Self-pay | Admitting: Hematology & Oncology

## 2013-11-20 ENCOUNTER — Other Ambulatory Visit: Payer: Medicare Other

## 2013-11-20 ENCOUNTER — Ambulatory Visit: Payer: Medicare Other

## 2013-11-20 ENCOUNTER — Other Ambulatory Visit (HOSPITAL_BASED_OUTPATIENT_CLINIC_OR_DEPARTMENT_OTHER): Payer: Medicare Other | Admitting: Lab

## 2013-11-20 ENCOUNTER — Ambulatory Visit (HOSPITAL_BASED_OUTPATIENT_CLINIC_OR_DEPARTMENT_OTHER): Payer: Medicare Other | Admitting: Hematology & Oncology

## 2013-11-20 ENCOUNTER — Ambulatory Visit (HOSPITAL_BASED_OUTPATIENT_CLINIC_OR_DEPARTMENT_OTHER): Payer: Medicare Other

## 2013-11-20 ENCOUNTER — Ambulatory Visit: Payer: Medicare Other | Admitting: Oncology

## 2013-11-20 VITALS — BP 137/73 | HR 82 | Temp 98.3°F | Resp 18 | Ht 66.0 in | Wt 163.0 lb

## 2013-11-20 DIAGNOSIS — Z5112 Encounter for antineoplastic immunotherapy: Secondary | ICD-10-CM | POA: Diagnosis not present

## 2013-11-20 DIAGNOSIS — E859 Amyloidosis, unspecified: Secondary | ICD-10-CM | POA: Diagnosis not present

## 2013-11-20 LAB — COMPREHENSIVE METABOLIC PANEL
ALT: 12 U/L (ref 0–53)
AST: 13 U/L (ref 0–37)
Albumin: 4.1 g/dL (ref 3.5–5.2)
Alkaline Phosphatase: 74 U/L (ref 39–117)
BUN: 22 mg/dL (ref 6–23)
CO2: 23 mEq/L (ref 19–32)
Calcium: 9.5 mg/dL (ref 8.4–10.5)
Chloride: 106 mEq/L (ref 96–112)
Creatinine, Ser: 0.99 mg/dL (ref 0.50–1.35)
Glucose, Bld: 125 mg/dL — ABNORMAL HIGH (ref 70–99)
Potassium: 3.7 mEq/L (ref 3.5–5.3)
Sodium: 138 mEq/L (ref 135–145)
Total Bilirubin: 0.5 mg/dL (ref 0.2–1.2)
Total Protein: 6.9 g/dL (ref 6.0–8.3)

## 2013-11-20 LAB — CBC WITH DIFFERENTIAL (CANCER CENTER ONLY)
BASO#: 0 10*3/uL (ref 0.0–0.2)
BASO%: 0.6 % (ref 0.0–2.0)
EOS%: 2.3 % (ref 0.0–7.0)
Eosinophils Absolute: 0.2 10*3/uL (ref 0.0–0.5)
HCT: 44.8 % (ref 38.7–49.9)
HGB: 15.5 g/dL (ref 13.0–17.1)
LYMPH#: 1.8 10*3/uL (ref 0.9–3.3)
LYMPH%: 25.5 % (ref 14.0–48.0)
MCH: 32.8 pg (ref 28.0–33.4)
MCHC: 34.6 g/dL (ref 32.0–35.9)
MCV: 95 fL (ref 82–98)
MONO#: 0.4 10*3/uL (ref 0.1–0.9)
MONO%: 5.9 % (ref 0.0–13.0)
NEUT#: 4.6 10*3/uL (ref 1.5–6.5)
NEUT%: 65.7 % (ref 40.0–80.0)
Platelets: 209 10*3/uL (ref 145–400)
RBC: 4.73 10*6/uL (ref 4.20–5.70)
RDW: 13.6 % (ref 11.1–15.7)
WBC: 7.1 10*3/uL (ref 4.0–10.0)

## 2013-11-20 MED ORDER — ONDANSETRON HCL 8 MG PO TABS
8.0000 mg | ORAL_TABLET | Freq: Once | ORAL | Status: AC
  Administered 2013-11-20: 8 mg via ORAL

## 2013-11-20 MED ORDER — BORTEZOMIB CHEMO SQ INJECTION 3.5 MG (2.5MG/ML)
1.5000 mg/m2 | Freq: Once | INTRAMUSCULAR | Status: AC
  Administered 2013-11-20: 2.75 mg via SUBCUTANEOUS
  Filled 2013-11-20: qty 2.75

## 2013-11-20 MED ORDER — ONDANSETRON HCL 8 MG PO TABS
ORAL_TABLET | ORAL | Status: AC
  Filled 2013-11-20: qty 1

## 2013-11-20 NOTE — Patient Instructions (Signed)
Bortezomib injection What is this medicine? BORTEZOMIB (bor TEZ oh mib) is a chemotherapy drug. It slows the growth of cancer cells. This medicine is used to treat multiple myeloma, and certain lymphomas, such as mantle-cell lymphoma. This medicine may be used for other purposes; ask your health care provider or pharmacist if you have questions. COMMON BRAND NAME(S): Velcade What should I tell my health care provider before I take this medicine? They need to know if you have any of these conditions: -diabetes -heart disease -irregular heartbeat -liver disease -on hemodialysis -low blood counts, like low white blood cells, platelets, or hemoglobin -peripheral neuropathy -taking medicine for blood pressure -an unusual or allergic reaction to bortezomib, mannitol, boron, other medicines, foods, dyes, or preservatives -pregnant or trying to get pregnant -breast-feeding How should I use this medicine? This medicine is for injection into a vein or for injection under the skin. It is given by a health care professional in a hospital or clinic setting. Talk to your pediatrician regarding the use of this medicine in children. Special care may be needed. Overdosage: If you think you have taken too much of this medicine contact a poison control center or emergency room at once. NOTE: This medicine is only for you. Do not share this medicine with others. What if I miss a dose? It is important not to miss your dose. Call your doctor or health care professional if you are unable to keep an appointment. What may interact with this medicine? This medicine may interact with the following medications: -ketoconazole -rifampin -ritonavir -St. John's Wort This list may not describe all possible interactions. Give your health care provider a list of all the medicines, herbs, non-prescription drugs, or dietary supplements you use. Also tell them if you smoke, drink alcohol, or use illegal drugs. Some items  may interact with your medicine. What should I watch for while using this medicine? Visit your doctor for checks on your progress. This drug may make you feel generally unwell. This is not uncommon, as chemotherapy can affect healthy cells as well as cancer cells. Report any side effects. Continue your course of treatment even though you feel ill unless your doctor tells you to stop. You may get drowsy or dizzy. Do not drive, use machinery, or do anything that needs mental alertness until you know how this medicine affects you. Do not stand or sit up quickly, especially if you are an older patient. This reduces the risk of dizzy or fainting spells. In some cases, you may be given additional medicines to help with side effects. Follow all directions for their use. Call your doctor or health care professional for advice if you get a fever, chills or sore throat, or other symptoms of a cold or flu. Do not treat yourself. This drug decreases your body's ability to fight infections. Try to avoid being around people who are sick. This medicine may increase your risk to bruise or bleed. Call your doctor or health care professional if you notice any unusual bleeding. You may need blood work done while you are taking this medicine. In some patients, this medicine may cause a serious brain infection that may cause death. If you have any problems seeing, thinking, speaking, walking, or standing, tell your doctor right away. If you cannot reach your doctor, urgently seek other source of medical care. Do not become pregnant while taking this medicine. Women should inform their doctor if they wish to become pregnant or think they might be pregnant. There is   a potential for serious side effects to an unborn child. Talk to your health care professional or pharmacist for more information. Do not breast-feed an infant while taking this medicine. Check with your doctor or health care professional if you get an attack of  severe diarrhea, nausea and vomiting, or if you sweat a lot. The loss of too much body fluid can make it dangerous for you to take this medicine. What side effects may I notice from receiving this medicine? Side effects that you should report to your doctor or health care professional as soon as possible: -allergic reactions like skin rash, itching or hives, swelling of the face, lips, or tongue -breathing problems -changes in hearing -changes in vision -fast, irregular heartbeat -feeling faint or lightheaded, falls -pain, tingling, numbness in the hands or feet -right upper belly pain -seizures -swelling of the ankles, feet, hands -unusual bleeding or bruising -unusually weak or tired -vomiting -yellowing of the eyes or skin Side effects that usually do not require medical attention (report to your doctor or health care professional if they continue or are bothersome): -changes in emotions or moods -constipation -diarrhea -loss of appetite -headache -irritation at site where injected -nausea This list may not describe all possible side effects. Call your doctor for medical advice about side effects. You may report side effects to FDA at 1-800-FDA-1088. Where should I keep my medicine? This drug is given in a hospital or clinic and will not be stored at home. NOTE: This sheet is a summary. It may not cover all possible information. If you have questions about this medicine, talk to your doctor, pharmacist, or health care provider.  2015, [employer] Standard. (2012-12-25 12:46:32)  

## 2013-11-27 ENCOUNTER — Other Ambulatory Visit (HOSPITAL_BASED_OUTPATIENT_CLINIC_OR_DEPARTMENT_OTHER): Payer: Medicare Other | Admitting: Lab

## 2013-11-27 ENCOUNTER — Other Ambulatory Visit: Payer: Medicare Other

## 2013-11-27 ENCOUNTER — Ambulatory Visit: Payer: Medicare Other

## 2013-11-27 ENCOUNTER — Ambulatory Visit (HOSPITAL_BASED_OUTPATIENT_CLINIC_OR_DEPARTMENT_OTHER): Payer: Medicare Other

## 2013-11-27 VITALS — BP 123/72 | HR 73 | Temp 97.5°F | Resp 16

## 2013-11-27 DIAGNOSIS — C9030 Solitary plasmacytoma not having achieved remission: Secondary | ICD-10-CM

## 2013-11-27 DIAGNOSIS — E859 Amyloidosis, unspecified: Secondary | ICD-10-CM

## 2013-11-27 DIAGNOSIS — Z23 Encounter for immunization: Secondary | ICD-10-CM | POA: Diagnosis not present

## 2013-11-27 DIAGNOSIS — Z5112 Encounter for antineoplastic immunotherapy: Secondary | ICD-10-CM | POA: Diagnosis not present

## 2013-11-27 LAB — CBC WITH DIFFERENTIAL (CANCER CENTER ONLY)
BASO#: 0 10*3/uL (ref 0.0–0.2)
BASO%: 0.4 % (ref 0.0–2.0)
EOS%: 3.7 % (ref 0.0–7.0)
Eosinophils Absolute: 0.2 10*3/uL (ref 0.0–0.5)
HCT: 45.7 % (ref 38.7–49.9)
HGB: 15.6 g/dL (ref 13.0–17.1)
LYMPH#: 1.4 10*3/uL (ref 0.9–3.3)
LYMPH%: 26.5 % (ref 14.0–48.0)
MCH: 32.4 pg (ref 28.0–33.4)
MCHC: 34.1 g/dL (ref 32.0–35.9)
MCV: 95 fL (ref 82–98)
MONO#: 0.4 10*3/uL (ref 0.1–0.9)
MONO%: 7.7 % (ref 0.0–13.0)
NEUT#: 3.3 10*3/uL (ref 1.5–6.5)
NEUT%: 61.7 % (ref 40.0–80.0)
Platelets: 205 10*3/uL (ref 145–400)
RBC: 4.81 10*6/uL (ref 4.20–5.70)
RDW: 13.7 % (ref 11.1–15.7)
WBC: 5.4 10*3/uL (ref 4.0–10.0)

## 2013-11-27 LAB — CMP (CANCER CENTER ONLY)
ALT(SGPT): 16 U/L (ref 10–47)
AST: 14 U/L (ref 11–38)
Albumin: 3.5 g/dL (ref 3.3–5.5)
Alkaline Phosphatase: 73 U/L (ref 26–84)
BUN, Bld: 18 mg/dL (ref 7–22)
CO2: 24 mEq/L (ref 18–33)
Calcium: 8.8 mg/dL (ref 8.0–10.3)
Chloride: 107 mEq/L (ref 98–108)
Creat: 0.8 mg/dl (ref 0.6–1.2)
Glucose, Bld: 113 mg/dL (ref 73–118)
Potassium: 3.8 mEq/L (ref 3.3–4.7)
Sodium: 142 mEq/L (ref 128–145)
Total Bilirubin: 0.6 mg/dl (ref 0.20–1.60)
Total Protein: 6.8 g/dL (ref 6.4–8.1)

## 2013-11-27 MED ORDER — BORTEZOMIB CHEMO SQ INJECTION 3.5 MG (2.5MG/ML)
1.5000 mg/m2 | Freq: Once | INTRAMUSCULAR | Status: AC
  Administered 2013-11-27: 2.75 mg via SUBCUTANEOUS
  Filled 2013-11-27: qty 2.75

## 2013-11-27 MED ORDER — ONDANSETRON HCL 8 MG PO TABS
8.0000 mg | ORAL_TABLET | Freq: Once | ORAL | Status: AC
  Administered 2013-11-27: 8 mg via ORAL

## 2013-11-27 MED ORDER — INFLUENZA VAC SPLIT QUAD 0.5 ML IM SUSY
0.5000 mL | PREFILLED_SYRINGE | Freq: Once | INTRAMUSCULAR | Status: AC
  Administered 2013-11-27: 0.5 mL via INTRAMUSCULAR
  Filled 2013-11-27: qty 0.5

## 2013-11-27 MED ORDER — ONDANSETRON HCL 8 MG PO TABS
ORAL_TABLET | ORAL | Status: AC
  Filled 2013-11-27: qty 1

## 2013-11-27 NOTE — Patient Instructions (Signed)
Bortezomib injection What is this medicine? BORTEZOMIB (bor TEZ oh mib) is a chemotherapy drug. It slows the growth of cancer cells. This medicine is used to treat multiple myeloma, and certain lymphomas, such as mantle-cell lymphoma. This medicine may be used for other purposes; ask your health care provider or pharmacist if you have questions. COMMON BRAND NAME(S): Velcade What should I tell my health care provider before I take this medicine? They need to know if you have any of these conditions: -diabetes -heart disease -irregular heartbeat -liver disease -on hemodialysis -low blood counts, like low white blood cells, platelets, or hemoglobin -peripheral neuropathy -taking medicine for blood pressure -an unusual or allergic reaction to bortezomib, mannitol, boron, other medicines, foods, dyes, or preservatives -pregnant or trying to get pregnant -breast-feeding How should I use this medicine? This medicine is for injection into a vein or for injection under the skin. It is given by a health care professional in a hospital or clinic setting. Talk to your pediatrician regarding the use of this medicine in children. Special care may be needed. Overdosage: If you think you have taken too much of this medicine contact a poison control center or emergency room at once. NOTE: This medicine is only for you. Do not share this medicine with others. What if I miss a dose? It is important not to miss your dose. Call your doctor or health care professional if you are unable to keep an appointment. What may interact with this medicine? This medicine may interact with the following medications: -ketoconazole -rifampin -ritonavir -St. John's Wort This list may not describe all possible interactions. Give your health care provider a list of all the medicines, herbs, non-prescription drugs, or dietary supplements you use. Also tell them if you smoke, drink alcohol, or use illegal drugs. Some items  may interact with your medicine. What should I watch for while using this medicine? Visit your doctor for checks on your progress. This drug may make you feel generally unwell. This is not uncommon, as chemotherapy can affect healthy cells as well as cancer cells. Report any side effects. Continue your course of treatment even though you feel ill unless your doctor tells you to stop. You may get drowsy or dizzy. Do not drive, use machinery, or do anything that needs mental alertness until you know how this medicine affects you. Do not stand or sit up quickly, especially if you are an older patient. This reduces the risk of dizzy or fainting spells. In some cases, you may be given additional medicines to help with side effects. Follow all directions for their use. Call your doctor or health care professional for advice if you get a fever, chills or sore throat, or other symptoms of a cold or flu. Do not treat yourself. This drug decreases your body's ability to fight infections. Try to avoid being around people who are sick. This medicine may increase your risk to bruise or bleed. Call your doctor or health care professional if you notice any unusual bleeding. You may need blood work done while you are taking this medicine. In some patients, this medicine may cause a serious brain infection that may cause death. If you have any problems seeing, thinking, speaking, walking, or standing, tell your doctor right away. If you cannot reach your doctor, urgently seek other source of medical care. Do not become pregnant while taking this medicine. Women should inform their doctor if they wish to become pregnant or think they might be pregnant. There is   a potential for serious side effects to an unborn child. Talk to your health care professional or pharmacist for more information. Do not breast-feed an infant while taking this medicine. Check with your doctor or health care professional if you get an attack of  severe diarrhea, nausea and vomiting, or if you sweat a lot. The loss of too much body fluid can make it dangerous for you to take this medicine. What side effects may I notice from receiving this medicine? Side effects that you should report to your doctor or health care professional as soon as possible: -allergic reactions like skin rash, itching or hives, swelling of the face, lips, or tongue -breathing problems -changes in hearing -changes in vision -fast, irregular heartbeat -feeling faint or lightheaded, falls -pain, tingling, numbness in the hands or feet -right upper belly pain -seizures -swelling of the ankles, feet, hands -unusual bleeding or bruising -unusually weak or tired -vomiting -yellowing of the eyes or skin Side effects that usually do not require medical attention (report to your doctor or health care professional if they continue or are bothersome): -changes in emotions or moods -constipation -diarrhea -loss of appetite -headache -irritation at site where injected -nausea This list may not describe all possible side effects. Call your doctor for medical advice about side effects. You may report side effects to FDA at 1-800-FDA-1088. Where should I keep my medicine? This drug is given in a hospital or clinic and will not be stored at home. NOTE: This sheet is a summary. It may not cover all possible information. If you have questions about this medicine, talk to your doctor, pharmacist, or health care provider.  2015, [employer] Standard. (2012-12-25 12:46:32)  

## 2013-11-28 NOTE — Progress Notes (Signed)
Hematology and Oncology Follow Up Visit  William Barron [id number] [birth year] 68 y.o. 11/28/2013   Principle Diagnosis:   Kappa light chain amyloidosis  Current Therapy:    Weekly Velcade with oral Cytoxan/Decadron     Interim History:  William Barron is in for his first office visit. He is a 68 year old gentleman. He was seen previously at the main [place]. However, he wishes to be seen at the [place]. He just does not want people to know that he is being treated.  He is followed at [facility]. He is ultimately going to be considered for a bone marrow transplant.  He has undergone a lymph node biopsy. He did have a PET scan done back in June that showed a mildly hypermetabolic lymph nodes. The lymph node biopsy showed amyloidosis.  He is now started on treatments. He is doing well so far. He unfortunately was not taken the right dose of Cytoxan. He was taken about 10 of the dose. I talked to him about this. He is clearly taken the right dose now.  He feels okay. He's had no problems pain wants. There is no cough. He's had no shortness of breath. There is no diarrhea. She's had no rashes. He's had no headache. There is no visual issues.  His performance status is ECOG 1.  Medications: Current outpatient prescriptions:cyclophosphamide (CYTOXAN) 50 MG tablet, Take 11 tablets weekly on an empty stomach 1 hour before or 2 hours after meals., Disp: 50 tablet, Rfl: 2;  dexamethasone (DECADRON) 4 MG tablet, Take 10 tablets weekly with Chemotherapy., Disp: 40 tablet, Rfl: 2;  fluconazole (DIFLUCAN) 200 MG tablet, Take 200 mg by mouth every 7 (seven) days., Disp: , Rfl:  minoxidil (ROGAINE) 2 % external solution, Apply 1 application topically daily. , Disp: , Rfl: ;  Tamsulosin HCl (FLOMAX) 0.4 MG CAPS, Take 0.4 mg by mouth 2 (two) times daily. , Disp: , Rfl: ;  Testosterone (ANDROGEL PUMP TD), Place 3 sprays onto the skin daily. daily, Disp: , Rfl: ;  zolpidem (AMBIEN)  5 MG tablet, Take 2.5-5 mg by mouth at bedtime as needed for sleep., Disp: , Rfl:  prochlorperazine (COMPAZINE) 10 MG tablet, Take 1 tablet (10 mg total) by mouth every 6 (six) hours as needed for nausea or vomiting., Disp: 30 tablet, Rfl: 3  Allergies:  Allergies  Allergen Reactions  . Penicillins Swelling and Rash    Past Medical History, Surgical history, Social history, and Family History were reviewed and updated.  Review of Systems: As above  Physical Exam:  height is 5\' 6"  (1.676 m) and weight is 163 lb (73.936 kg). His oral temperature is 98.3 F (36.8 C). His blood pressure is 137/73 and his pulse is 82. His respiration is 18.   Well-developed well-nourished white gentleman. Head and neck exam shows no ocular or oral lesions. Has no palpable cervical or supraclavicular lymph nodes. Lungs are clear. Cardiac exam regular rhythm with no murmurs rubs or bruits. Abdomen soft. Good bowel sounds. There is no fluid wave. Is no palpable liver or spleen tip. Extremities shows no clubbing, cyanosis or edema. Has good strength. Has good range of motion of his joints. Skin exam no rashes, ecchymosis or petechia. Neurological exam shows no focal neurological deficits.  Lab Results  Component Value Date   WBC 5.4 11/27/2013   HGB 15.6 11/27/2013   HCT 45.7 11/27/2013   MCV 95 11/27/2013   PLT 205 11/27/2013     Chemistry  Component Value Date/Time   NA 142 11/27/2013 1005   NA 138 11/13/2013 1003   NA 141 04/17/2013 1044   K 3.8 11/27/2013 1005   K 4.0 11/13/2013 1003   K 4.1 04/17/2013 1044   CL 107 11/27/2013 1005   CL 105 11/13/2013 1003   CO2 24 11/27/2013 1005   CO2 26 11/13/2013 1003   CO2 28 04/17/2013 1044   BUN 18 11/27/2013 1005   BUN 22 11/13/2013 1003   BUN 17.3 04/17/2013 1044   CREATININE 0.8 11/27/2013 1005   CREATININE 1.14 11/13/2013 1003   CREATININE 1.1 04/17/2013 1044      Component Value Date/Time   CALCIUM 8.8 11/27/2013 1005   CALCIUM 9.3 11/13/2013 1003   CALCIUM 9.8 04/17/2013  1044   ALKPHOS 73 11/27/2013 1005   ALKPHOS 70 11/13/2013 1003   ALKPHOS 89 04/17/2013 1044   AST 14 11/27/2013 1005   AST 14 11/13/2013 1003   AST 16 04/17/2013 1044   ALT 16 11/27/2013 1005   ALT 13 11/13/2013 1003   ALT 17 04/17/2013 1044   BILITOT 0.60 11/27/2013 1005   BILITOT 0.4 11/13/2013 1003   BILITOT 0.45 04/17/2013 1044         Impression and Plan: William Barron is 68 year old gentleman with amyloidosis. He has an IgA kappa monoclonal protein. We will have to monitor this.   That he was back to see the specialist at [facility] in October. I am not sure when he will be set up for a stem cell transplant.  We will monitor his blood work weekly with his chemotherapy.  Want to see her back in about one month. I will go ahead and redo his urine. I'll get his serum studies.     Volanda Napoleon, MD [id number] AM

## 2013-12-04 ENCOUNTER — Ambulatory Visit (HOSPITAL_BASED_OUTPATIENT_CLINIC_OR_DEPARTMENT_OTHER): Payer: Medicare Other

## 2013-12-04 ENCOUNTER — Other Ambulatory Visit (HOSPITAL_BASED_OUTPATIENT_CLINIC_OR_DEPARTMENT_OTHER): Payer: Medicare Other | Admitting: Lab

## 2013-12-04 ENCOUNTER — Ambulatory Visit: Payer: Medicare Other

## 2013-12-04 ENCOUNTER — Other Ambulatory Visit: Payer: Medicare Other

## 2013-12-04 VITALS — BP 160/75 | HR 72 | Temp 97.6°F | Resp 20

## 2013-12-04 DIAGNOSIS — E859 Amyloidosis, unspecified: Secondary | ICD-10-CM

## 2013-12-04 LAB — CMP (CANCER CENTER ONLY)
ALT(SGPT): 18 U/L (ref 10–47)
AST: 17 U/L (ref 11–38)
Albumin: 3.5 g/dL (ref 3.3–5.5)
Alkaline Phosphatase: 89 U/L — ABNORMAL HIGH (ref 26–84)
BUN, Bld: 22 mg/dL (ref 7–22)
CO2: 25 mEq/L (ref 18–33)
Calcium: 9.4 mg/dL (ref 8.0–10.3)
Chloride: 103 mEq/L (ref 98–108)
Creat: 1.1 mg/dl (ref 0.6–1.2)
Glucose, Bld: 108 mg/dL (ref 73–118)
Potassium: 3.4 mEq/L (ref 3.3–4.7)
Sodium: 142 mEq/L (ref 128–145)
Total Bilirubin: 0.6 mg/dl (ref 0.20–1.60)
Total Protein: 7.1 g/dL (ref 6.4–8.1)

## 2013-12-04 LAB — CBC WITH DIFFERENTIAL (CANCER CENTER ONLY)
BASO#: 0 10*3/uL (ref 0.0–0.2)
BASO%: 0.5 % (ref 0.0–2.0)
EOS%: 3.2 % (ref 0.0–7.0)
Eosinophils Absolute: 0.2 10*3/uL (ref 0.0–0.5)
HCT: 45.2 % (ref 38.7–49.9)
HGB: 15.5 g/dL (ref 13.0–17.1)
LYMPH#: 1.7 10*3/uL (ref 0.9–3.3)
LYMPH%: 30.9 % (ref 14.0–48.0)
MCH: 32.9 pg (ref 28.0–33.4)
MCHC: 34.3 g/dL (ref 32.0–35.9)
MCV: 96 fL (ref 82–98)
MONO#: 0.3 10*3/uL (ref 0.1–0.9)
MONO%: 5.9 % (ref 0.0–13.0)
NEUT#: 3.3 10*3/uL (ref 1.5–6.5)
NEUT%: 59.5 % (ref 40.0–80.0)
Platelets: 236 10*3/uL (ref 145–400)
RBC: 4.71 10*6/uL (ref 4.20–5.70)
RDW: 13.7 % (ref 11.1–15.7)
WBC: 5.6 10*3/uL (ref 4.0–10.0)

## 2013-12-04 MED ORDER — ONDANSETRON HCL 8 MG PO TABS
8.0000 mg | ORAL_TABLET | Freq: Once | ORAL | Status: AC
  Administered 2013-12-04: 8 mg via ORAL

## 2013-12-04 MED ORDER — BORTEZOMIB CHEMO SQ INJECTION 3.5 MG (2.5MG/ML)
1.5000 mg/m2 | Freq: Once | INTRAMUSCULAR | Status: AC
  Administered 2013-12-04: 2.75 mg via SUBCUTANEOUS
  Filled 2013-12-04: qty 2.75

## 2013-12-04 NOTE — Patient Instructions (Signed)
Bortezomib injection What is this medicine? BORTEZOMIB (bor TEZ oh mib) is a chemotherapy drug. It slows the growth of cancer cells. This medicine is used to treat multiple myeloma, and certain lymphomas, such as mantle-cell lymphoma. This medicine may be used for other purposes; ask your health care provider or pharmacist if you have questions. COMMON BRAND NAME(S): Velcade What should I tell my health care provider before I take this medicine? They need to know if you have any of these conditions: -diabetes -heart disease -irregular heartbeat -liver disease -on hemodialysis -low blood counts, like low white blood cells, platelets, or hemoglobin -peripheral neuropathy -taking medicine for blood pressure -an unusual or allergic reaction to bortezomib, mannitol, boron, other medicines, foods, dyes, or preservatives -pregnant or trying to get pregnant -breast-feeding How should I use this medicine? This medicine is for injection into a vein or for injection under the skin. It is given by a health care professional in a hospital or clinic setting. Talk to your pediatrician regarding the use of this medicine in children. Special care may be needed. Overdosage: If you think you have taken too much of this medicine contact a poison control center or emergency room at once. NOTE: This medicine is only for you. Do not share this medicine with others. What if I miss a dose? It is important not to miss your dose. Call your doctor or health care professional if you are unable to keep an appointment. What may interact with this medicine? This medicine may interact with the following medications: -ketoconazole -rifampin -ritonavir -St. John's Wort This list may not describe all possible interactions. Give your health care provider a list of all the medicines, herbs, non-prescription drugs, or dietary supplements you use. Also tell them if you smoke, drink alcohol, or use illegal drugs. Some items  may interact with your medicine. What should I watch for while using this medicine? Visit your doctor for checks on your progress. This drug may make you feel generally unwell. This is not uncommon, as chemotherapy can affect healthy cells as well as cancer cells. Report any side effects. Continue your course of treatment even though you feel ill unless your doctor tells you to stop. You may get drowsy or dizzy. Do not drive, use machinery, or do anything that needs mental alertness until you know how this medicine affects you. Do not stand or sit up quickly, especially if you are an older patient. This reduces the risk of dizzy or fainting spells. In some cases, you may be given additional medicines to help with side effects. Follow all directions for their use. Call your doctor or health care professional for advice if you get a fever, chills or sore throat, or other symptoms of a cold or flu. Do not treat yourself. This drug decreases your body's ability to fight infections. Try to avoid being around people who are sick. This medicine may increase your risk to bruise or bleed. Call your doctor or health care professional if you notice any unusual bleeding. You may need blood work done while you are taking this medicine. In some patients, this medicine may cause a serious brain infection that may cause death. If you have any problems seeing, thinking, speaking, walking, or standing, tell your doctor right away. If you cannot reach your doctor, urgently seek other source of medical care. Do not become pregnant while taking this medicine. Women should inform their doctor if they wish to become pregnant or think they might be pregnant. There is   a potential for serious side effects to an unborn child. Talk to your health care professional or pharmacist for more information. Do not breast-feed an infant while taking this medicine. Check with your doctor or health care professional if you get an attack of  severe diarrhea, nausea and vomiting, or if you sweat a lot. The loss of too much body fluid can make it dangerous for you to take this medicine. What side effects may I notice from receiving this medicine? Side effects that you should report to your doctor or health care professional as soon as possible: -allergic reactions like skin rash, itching or hives, swelling of the face, lips, or tongue -breathing problems -changes in hearing -changes in vision -fast, irregular heartbeat -feeling faint or lightheaded, falls -pain, tingling, numbness in the hands or feet -right upper belly pain -seizures -swelling of the ankles, feet, hands -unusual bleeding or bruising -unusually weak or tired -vomiting -yellowing of the eyes or skin Side effects that usually do not require medical attention (report to your doctor or health care professional if they continue or are bothersome): -changes in emotions or moods -constipation -diarrhea -loss of appetite -headache -irritation at site where injected -nausea This list may not describe all possible side effects. Call your doctor for medical advice about side effects. You may report side effects to FDA at 1-800-FDA-1088. Where should I keep my medicine? This drug is given in a hospital or clinic and will not be stored at home. NOTE: This sheet is a summary. It may not cover all possible information. If you have questions about this medicine, talk to your doctor, pharmacist, or health care provider.  2015, [employer] Standard. (2012-12-25 12:46:32)  

## 2013-12-11 ENCOUNTER — Ambulatory Visit (HOSPITAL_BASED_OUTPATIENT_CLINIC_OR_DEPARTMENT_OTHER): Payer: Medicare Other

## 2013-12-11 ENCOUNTER — Other Ambulatory Visit (HOSPITAL_BASED_OUTPATIENT_CLINIC_OR_DEPARTMENT_OTHER): Payer: Medicare Other | Admitting: Lab

## 2013-12-11 ENCOUNTER — Ambulatory Visit: Payer: Medicare Other

## 2013-12-11 ENCOUNTER — Other Ambulatory Visit: Payer: Medicare Other

## 2013-12-11 VITALS — BP 88/63 | HR 85 | Temp 98.0°F | Resp 20

## 2013-12-11 DIAGNOSIS — E859 Amyloidosis, unspecified: Secondary | ICD-10-CM | POA: Diagnosis not present

## 2013-12-11 DIAGNOSIS — Z5112 Encounter for antineoplastic immunotherapy: Secondary | ICD-10-CM | POA: Diagnosis not present

## 2013-12-11 DIAGNOSIS — R413 Other amnesia: Secondary | ICD-10-CM

## 2013-12-11 LAB — CMP (CANCER CENTER ONLY)
ALT(SGPT): 17 U/L (ref 10–47)
AST: 17 U/L (ref 11–38)
Albumin: 3.6 g/dL (ref 3.3–5.5)
Alkaline Phosphatase: 88 U/L — ABNORMAL HIGH (ref 26–84)
BUN, Bld: 21 mg/dL (ref 7–22)
CO2: 23 mEq/L (ref 18–33)
Calcium: 9 mg/dL (ref 8.0–10.3)
Chloride: 104 mEq/L (ref 98–108)
Creat: 1 mg/dl (ref 0.6–1.2)
Glucose, Bld: 156 mg/dL — ABNORMAL HIGH (ref 73–118)
Potassium: 3.4 mEq/L (ref 3.3–4.7)
Sodium: 141 mEq/L (ref 128–145)
Total Bilirubin: 0.7 mg/dl (ref 0.20–1.60)
Total Protein: 6.9 g/dL (ref 6.4–8.1)

## 2013-12-11 LAB — CBC WITH DIFFERENTIAL (CANCER CENTER ONLY)
BASO#: 0 10*3/uL (ref 0.0–0.2)
BASO%: 0.4 % (ref 0.0–2.0)
EOS%: 2.8 % (ref 0.0–7.0)
Eosinophils Absolute: 0.1 10*3/uL (ref 0.0–0.5)
HCT: 43.2 % (ref 38.7–49.9)
HGB: 15.1 g/dL (ref 13.0–17.1)
LYMPH#: 1.4 10*3/uL (ref 0.9–3.3)
LYMPH%: 28 % (ref 14.0–48.0)
MCH: 33.1 pg (ref 28.0–33.4)
MCHC: 35 g/dL (ref 32.0–35.9)
MCV: 95 fL (ref 82–98)
MONO#: 0.3 10*3/uL (ref 0.1–0.9)
MONO%: 5.6 % (ref 0.0–13.0)
NEUT#: 3.1 10*3/uL (ref 1.5–6.5)
NEUT%: 63.2 % (ref 40.0–80.0)
Platelets: 238 10*3/uL (ref 145–400)
RBC: 4.56 10*6/uL (ref 4.20–5.70)
RDW: 13.7 % (ref 11.1–15.7)
WBC: 5 10*3/uL (ref 4.0–10.0)

## 2013-12-11 MED ORDER — CLORAZEPATE DIPOTASSIUM 3.75 MG PO TABS: ORAL_TABLET | ORAL | Status: DC

## 2013-12-11 MED ORDER — ONDANSETRON HCL 8 MG PO TABS
ORAL_TABLET | ORAL | Status: AC
  Filled 2013-12-11: qty 1

## 2013-12-11 MED ORDER — ONDANSETRON HCL 8 MG PO TABS
8.0000 mg | ORAL_TABLET | Freq: Once | ORAL | Status: AC
  Administered 2013-12-11: 8 mg via ORAL

## 2013-12-11 MED ORDER — BORTEZOMIB CHEMO SQ INJECTION 3.5 MG (2.5MG/ML)
1.5000 mg/m2 | Freq: Once | INTRAMUSCULAR | Status: AC
  Administered 2013-12-11: 2.75 mg via SUBCUTANEOUS
  Filled 2013-12-11: qty 2.75

## 2013-12-11 MED ORDER — DEXAMETHASONE 4 MG PO TABS: ORAL_TABLET | ORAL | Status: DC

## 2013-12-11 NOTE — Patient Instructions (Signed)
Clorazepate tablets What is this medicine? CLORAZEPATE (klor AZ e pate) is a benzodiazepine. It is used to treat anxiety. It also used to treat alcohol withdrawal and certain types of seizures. This medicine may be used for other purposes; ask your health care provider or pharmacist if you have questions. COMMON BRAND NAME(S): Gen-Xene, Tranxene What should I tell my health care provider before I take this medicine? They need to know if you have any of these conditions: -an alcohol or drug abuse problem -bipolar disorder, depression, psychosis or other mental health condition -glaucoma -kidney or liver disease -lung or breathing disease -myasthenia gravis -Parkinson's disease -seizures or a history of seizures -suicidal thoughts -an unusual or allergic reaction to clorazepate, other benzodiazepines, other medicines, foods, dyes, or preservatives -pregnant or trying to get pregnant -breast-feeding How should I use this medicine? Take this medicine by mouth with a glass of water. Follow the directions on the prescription label. Take your doses at regular intervals. Do not take your medicine more often than directed. If you have been taking this medicine regularly for some time, do not suddenly stop taking it. You must gradually reduce the dose or you may get severe side effects. Ask your doctor or health care professional for advice. Even after you stop taking this medicine it can still affect your body for several days. A special MedGuide will be given to you by the pharmacist with each new prescription and refill. Be sure to read this information carefully each time. Talk to your pediatrician regarding the use of this medicine in children. Special care may be needed. While this drug may be prescribed for children as young as 9 years for selected conditions, precautions do apply. Overdosage: If you think you have taken too much of this medicine contact a poison control center or emergency room  at once. NOTE: This medicine is only for you. Do not share this medicine with others. What if I miss a dose? If you miss a dose, take it as soon as you can. If it is almost time for your next dose, take only that dose. Do not take double or extra doses. What may interact with this medicine? Do not take this medicine with any of the following medications: -itraconazole This medicine may also interact with the following medications: -medicines for depression, mental problems or psychiatric disturbances -prescription pain medicines -some medicines for seizures like carbamazepine, phenobarbital, phenytoin, primidone This list may not describe all possible interactions. Give your health care provider a list of all the medicines, herbs, non-prescription drugs, or dietary supplements you use. Also tell them if you smoke, drink alcohol, or use illegal drugs. Some items may interact with your medicine. What should I watch for while using this medicine? Visit your doctor or health care professional for regular checks on your progress. Your body can become dependent on this medicine. Ask your doctor or health care professional if you still need to take it. You may get drowsy or dizzy. Do not drive, use machinery, or do anything that needs mental alertness until you know how this medicine affects you. To reduce the risk of dizzy and fainting spells, do not stand or sit up quickly, especially if you are an older patient. Alcohol may increase dizziness and drowsiness. Avoid alcoholic drinks. Do not treat yourself for coughs, colds or allergies without asking your doctor or health care professional for advice. Some ingredients can increase possible side effects. The use of this medicine may increase the chance of suicidal  thoughts or actions. Pay special attention to how you are responding while on this medicine. Any worsening of mood, or thoughts of suicide or dying should be reported to your health care  professional right away. Women who become pregnant while using this medicine may enroll in the [place] Pregnancy Registry by calling ([phone] [phone]. This registry collects information about the safety of antiepileptic drug use during pregnancy. What side effects may I notice from receiving this medicine? Side effects that you should report to your doctor or health care professional as soon as possible: -allergic reactions like skin rash, itching or hives, swelling of the face, lips, or tongue -confusion -feeling faint or lightheaded, falls -mood changes, depression, excitability or aggressive behavior -movement difficulty, staggering or jerky movements -muscle cramps -restlessness -tremors -unusually weak or tired Side effects that usually do not require medical attention (report to your doctor or health care professional if they continue or are bothersome): -changes in vision -dizziness, drowsiness, clumsiness, or unsteadiness, a hangover effect -dry mouth -headache -stomach upset This list may not describe all possible side effects. Call your doctor for medical advice about side effects. You may report side effects to FDA at 1-800-FDA-1088. Where should I keep my medicine? Keep out of the reach of children. This medicine can be abused. Keep your medicine in a safe place to protect it from theft. Do not share this medicine with anyone. Selling or giving away this medicine is dangerous and against the law. Store at room temperature below 25 degrees C (77 degrees F). Protect from moisture and light. Keep bottle tightly closed. Throw away any unused medicine after the expiration date. NOTE: This sheet is a summary. It may not cover all possible information. If you have questions about this medicine, talk to your doctor, pharmacist, or health care provider.  2015, Elsevier/Gold Standard. (2008-09-23 22:50:55) Bortezomib injection What is this medicine? BORTEZOMIB  (bor TEZ oh mib) is a chemotherapy drug. It slows the growth of cancer cells. This medicine is used to treat multiple myeloma, and certain lymphomas, such as mantle-cell lymphoma. This medicine may be used for other purposes; ask your health care provider or pharmacist if you have questions. COMMON BRAND NAME(S): Velcade What should I tell my health care provider before I take this medicine? They need to know if you have any of these conditions: -diabetes -heart disease -irregular heartbeat -liver disease -on hemodialysis -low blood counts, like low white blood cells, platelets, or hemoglobin -peripheral neuropathy -taking medicine for blood pressure -an unusual or allergic reaction to bortezomib, mannitol, boron, other medicines, foods, dyes, or preservatives -pregnant or trying to get pregnant -breast-feeding How should I use this medicine? This medicine is for injection into a vein or for injection under the skin. It is given by a health care professional in a hospital or clinic setting. Talk to your pediatrician regarding the use of this medicine in children. Special care may be needed. Overdosage: If you think you have taken too much of this medicine contact a poison control center or emergency room at once. NOTE: This medicine is only for you. Do not share this medicine with others. What if I miss a dose? It is important not to miss your dose. Call your doctor or health care professional if you are unable to keep an appointment. What may interact with this medicine? This medicine may interact with the following medications: -ketoconazole -rifampin -ritonavir -St. John's Wort This list may not describe all possible interactions. Give your health care  provider a list of all the medicines, herbs, non-prescription drugs, or dietary supplements you use. Also tell them if you smoke, drink alcohol, or use illegal drugs. Some items may interact with your medicine. What should I watch for  while using this medicine? Visit your doctor for checks on your progress. This drug may make you feel generally unwell. This is not uncommon, as chemotherapy can affect healthy cells as well as cancer cells. Report any side effects. Continue your course of treatment even though you feel ill unless your doctor tells you to stop. You may get drowsy or dizzy. Do not drive, use machinery, or do anything that needs mental alertness until you know how this medicine affects you. Do not stand or sit up quickly, especially if you are an older patient. This reduces the risk of dizzy or fainting spells. In some cases, you may be given additional medicines to help with side effects. Follow all directions for their use. Call your doctor or health care professional for advice if you get a fever, chills or sore throat, or other symptoms of a cold or flu. Do not treat yourself. This drug decreases your body's ability to fight infections. Try to avoid being around people who are sick. This medicine may increase your risk to bruise or bleed. Call your doctor or health care professional if you notice any unusual bleeding. You may need blood work done while you are taking this medicine. In some patients, this medicine may cause a serious brain infection that may cause death. If you have any problems seeing, thinking, speaking, walking, or standing, tell your doctor right away. If you cannot reach your doctor, urgently seek other source of medical care. Do not become pregnant while taking this medicine. Women should inform their doctor if they wish to become pregnant or think they might be pregnant. There is a potential for serious side effects to an unborn child. Talk to your health care professional or pharmacist for more information. Do not breast-feed an infant while taking this medicine. Check with your doctor or health care professional if you get an attack of severe diarrhea, nausea and vomiting, or if you sweat a lot.  The loss of too much body fluid can make it dangerous for you to take this medicine. What side effects may I notice from receiving this medicine? Side effects that you should report to your doctor or health care professional as soon as possible: -allergic reactions like skin rash, itching or hives, swelling of the face, lips, or tongue -breathing problems -changes in hearing -changes in vision -fast, irregular heartbeat -feeling faint or lightheaded, falls -pain, tingling, numbness in the hands or feet -right upper belly pain -seizures -swelling of the ankles, feet, hands -unusual bleeding or bruising -unusually weak or tired -vomiting -yellowing of the eyes or skin Side effects that usually do not require medical attention (report to your doctor or health care professional if they continue or are bothersome): -changes in emotions or moods -constipation -diarrhea -loss of appetite -headache -irritation at site where injected -nausea This list may not describe all possible side effects. Call your doctor for medical advice about side effects. You may report side effects to FDA at 1-800-FDA-1088. Where should I keep my medicine? This drug is given in a hospital or clinic and will not be stored at home. NOTE: This sheet is a summary. It may not cover all possible information. If you have questions about this medicine, talk to your doctor, pharmacist, or health  care provider.  2015, Elsevier/Gold Standard. (2012-12-25 12:46:32)

## 2013-12-11 NOTE — Progress Notes (Signed)
Pt presents with complaints of "short fuse" and being hard to live with.  Dr. Marin Olp notified, will decrease Decadron to five tabs with chemo instead of ten.  Verbalized understanding. Also will place on Tranxene.

## 2013-12-12 LAB — KAPPA/LAMBDA LIGHT CHAINS
Kappa free light chain: 3.22 mg/dL — ABNORMAL HIGH (ref 0.33–1.94)
Kappa:Lambda Ratio: 15.33 — ABNORMAL HIGH (ref 0.26–1.65)
Lambda Free Lght Chn: 0.21 mg/dL — ABNORMAL LOW (ref 0.57–2.63)

## 2013-12-18 ENCOUNTER — Ambulatory Visit: Payer: Medicare Other

## 2013-12-18 ENCOUNTER — Other Ambulatory Visit: Payer: Medicare Other

## 2013-12-18 ENCOUNTER — Ambulatory Visit (HOSPITAL_BASED_OUTPATIENT_CLINIC_OR_DEPARTMENT_OTHER): Payer: Medicare Other

## 2013-12-18 ENCOUNTER — Ambulatory Visit (HOSPITAL_BASED_OUTPATIENT_CLINIC_OR_DEPARTMENT_OTHER): Payer: Medicare Other | Admitting: Lab

## 2013-12-18 VITALS — BP 118/72 | HR 79 | Temp 97.5°F | Resp 18

## 2013-12-18 DIAGNOSIS — Z5112 Encounter for antineoplastic immunotherapy: Secondary | ICD-10-CM

## 2013-12-18 DIAGNOSIS — E859 Amyloidosis, unspecified: Secondary | ICD-10-CM

## 2013-12-18 LAB — CBC WITH DIFFERENTIAL (CANCER CENTER ONLY)
BASO#: 0 10*3/uL (ref 0.0–0.2)
BASO%: 0.6 % (ref 0.0–2.0)
EOS%: 3.2 % (ref 0.0–7.0)
Eosinophils Absolute: 0.2 10*3/uL (ref 0.0–0.5)
HCT: 41.9 % (ref 38.7–49.9)
HGB: 14.6 g/dL (ref 13.0–17.1)
LYMPH#: 1.3 10*3/uL (ref 0.9–3.3)
LYMPH%: 24.9 % (ref 14.0–48.0)
MCH: 33.4 pg (ref 28.0–33.4)
MCHC: 34.8 g/dL (ref 32.0–35.9)
MCV: 96 fL (ref 82–98)
MONO#: 0.4 10*3/uL (ref 0.1–0.9)
MONO%: 6.8 % (ref 0.0–13.0)
NEUT#: 3.4 10*3/uL (ref 1.5–6.5)
NEUT%: 64.5 % (ref 40.0–80.0)
Platelets: 255 10*3/uL (ref 145–400)
RBC: 4.37 10*6/uL (ref 4.20–5.70)
RDW: 13.8 % (ref 11.1–15.7)
WBC: 5.3 10*3/uL (ref 4.0–10.0)

## 2013-12-18 LAB — CMP (CANCER CENTER ONLY)
ALT(SGPT): 19 U/L (ref 10–47)
AST: 14 U/L (ref 11–38)
Albumin: 3.4 g/dL (ref 3.3–5.5)
Alkaline Phosphatase: 75 U/L (ref 26–84)
BUN, Bld: 19 mg/dL (ref 7–22)
CO2: 23 mEq/L (ref 18–33)
Calcium: 9 mg/dL (ref 8.0–10.3)
Chloride: 103 mEq/L (ref 98–108)
Creat: 0.9 mg/dl (ref 0.6–1.2)
Glucose, Bld: 127 mg/dL — ABNORMAL HIGH (ref 73–118)
Potassium: 3.6 mEq/L (ref 3.3–4.7)
Sodium: 143 mEq/L (ref 128–145)
Total Bilirubin: 0.6 mg/dl (ref 0.20–1.60)
Total Protein: 6.5 g/dL (ref 6.4–8.1)

## 2013-12-18 MED ORDER — ONDANSETRON HCL 8 MG PO TABS
8.0000 mg | ORAL_TABLET | Freq: Once | ORAL | Status: AC
Start: 2013-12-18 — End: 2013-12-18
  Administered 2013-12-18: 8 mg via ORAL

## 2013-12-18 MED ORDER — BORTEZOMIB CHEMO SQ INJECTION 3.5 MG (2.5MG/ML)
1.5000 mg/m2 | Freq: Once | INTRAMUSCULAR | Status: AC
  Administered 2013-12-18: 2.75 mg via SUBCUTANEOUS
  Filled 2013-12-18: qty 2.75

## 2013-12-18 MED ORDER — ONDANSETRON HCL 8 MG PO TABS
ORAL_TABLET | ORAL | Status: AC
  Filled 2013-12-18: qty 1

## 2013-12-18 NOTE — Patient Instructions (Signed)
Bortezomib injection What is this medicine? BORTEZOMIB (bor TEZ oh mib) is a chemotherapy drug. It slows the growth of cancer cells. This medicine is used to treat multiple myeloma, and certain lymphomas, such as mantle-cell lymphoma. This medicine may be used for other purposes; ask your health care provider or pharmacist if you have questions. COMMON BRAND NAME(S): Velcade What should I tell my health care provider before I take this medicine? They need to know if you have any of these conditions: -diabetes -heart disease -irregular heartbeat -liver disease -on hemodialysis -low blood counts, like low white blood cells, platelets, or hemoglobin -peripheral neuropathy -taking medicine for blood pressure -an unusual or allergic reaction to bortezomib, mannitol, boron, other medicines, foods, dyes, or preservatives -pregnant or trying to get pregnant -breast-feeding How should I use this medicine? This medicine is for injection into a vein or for injection under the skin. It is given by a health care professional in a hospital or clinic setting. Talk to your pediatrician regarding the use of this medicine in children. Special care may be needed. Overdosage: If you think you have taken too much of this medicine contact a poison control center or emergency room at once. NOTE: This medicine is only for you. Do not share this medicine with others. What if I miss a dose? It is important not to miss your dose. Call your doctor or health care professional if you are unable to keep an appointment. What may interact with this medicine? This medicine may interact with the following medications: -ketoconazole -rifampin -ritonavir -St. John's Wort This list may not describe all possible interactions. Give your health care provider a list of all the medicines, herbs, non-prescription drugs, or dietary supplements you use. Also tell them if you smoke, drink alcohol, or use illegal drugs. Some items  may interact with your medicine. What should I watch for while using this medicine? Visit your doctor for checks on your progress. This drug may make you feel generally unwell. This is not uncommon, as chemotherapy can affect healthy cells as well as cancer cells. Report any side effects. Continue your course of treatment even though you feel ill unless your doctor tells you to stop. You may get drowsy or dizzy. Do not drive, use machinery, or do anything that needs mental alertness until you know how this medicine affects you. Do not stand or sit up quickly, especially if you are an older patient. This reduces the risk of dizzy or fainting spells. In some cases, you may be given additional medicines to help with side effects. Follow all directions for their use. Call your doctor or health care professional for advice if you get a fever, chills or sore throat, or other symptoms of a cold or flu. Do not treat yourself. This drug decreases your body's ability to fight infections. Try to avoid being around people who are sick. This medicine may increase your risk to bruise or bleed. Call your doctor or health care professional if you notice any unusual bleeding. You may need blood work done while you are taking this medicine. In some patients, this medicine may cause a serious brain infection that may cause death. If you have any problems seeing, thinking, speaking, walking, or standing, tell your doctor right away. If you cannot reach your doctor, urgently seek other source of medical care. Do not become pregnant while taking this medicine. Women should inform their doctor if they wish to become pregnant or think they might be pregnant. There is   a potential for serious side effects to an unborn child. Talk to your health care professional or pharmacist for more information. Do not breast-feed an infant while taking this medicine. Check with your doctor or health care professional if you get an attack of  severe diarrhea, nausea and vomiting, or if you sweat a lot. The loss of too much body fluid can make it dangerous for you to take this medicine. What side effects may I notice from receiving this medicine? Side effects that you should report to your doctor or health care professional as soon as possible: -allergic reactions like skin rash, itching or hives, swelling of the face, lips, or tongue -breathing problems -changes in hearing -changes in vision -fast, irregular heartbeat -feeling faint or lightheaded, falls -pain, tingling, numbness in the hands or feet -right upper belly pain -seizures -swelling of the ankles, feet, hands -unusual bleeding or bruising -unusually weak or tired -vomiting -yellowing of the eyes or skin Side effects that usually do not require medical attention (report to your doctor or health care professional if they continue or are bothersome): -changes in emotions or moods -constipation -diarrhea -loss of appetite -headache -irritation at site where injected -nausea This list may not describe all possible side effects. Call your doctor for medical advice about side effects. You may report side effects to FDA at 1-800-FDA-1088. Where should I keep my medicine? This drug is given in a hospital or clinic and will not be stored at home. NOTE: This sheet is a summary. It may not cover all possible information. If you have questions about this medicine, talk to your doctor, pharmacist, or health care provider.  2015, [employer] Standard. (2012-12-25 12:46:32)  

## 2013-12-20 LAB — PROTEIN ELECTROPHORESIS, SERUM, WITH REFLEX
Albumin ELP: 60.1 % (ref 55.8–66.1)
Alpha-1-Globulin: 3.5 % (ref 2.9–4.9)
Alpha-2-Globulin: 9.8 % (ref 7.1–11.8)
Beta 2: 4.7 % (ref 3.2–6.5)
Beta Globulin: 6 % (ref 4.7–7.2)
Gamma Globulin: 15.9 % (ref 11.1–18.8)
M-Spike, %: 0.66 g/dL
Total Protein, Serum Electrophoresis: 6.2 g/dL (ref 6.0–8.3)

## 2013-12-20 LAB — IFE INTERPRETATION

## 2013-12-20 LAB — IGG, IGA, IGM
IgA: 663 mg/dL — ABNORMAL HIGH (ref 68–379)
IgG (Immunoglobin G), Serum: 406 mg/dL — ABNORMAL LOW (ref 650–1600)
IgM, Serum: 15 mg/dL — ABNORMAL LOW (ref 41–251)

## 2013-12-20 LAB — KAPPA/LAMBDA LIGHT CHAINS
Kappa free light chain: 3.61 mg/dL — ABNORMAL HIGH (ref 0.33–1.94)
Kappa:Lambda Ratio: 6.94 — ABNORMAL HIGH (ref 0.26–1.65)
Lambda Free Lght Chn: 0.52 mg/dL — ABNORMAL LOW (ref 0.57–2.63)

## 2013-12-20 LAB — LACTATE DEHYDROGENASE: LDH: 113 U/L (ref 94–250)

## 2013-12-25 ENCOUNTER — Ambulatory Visit (HOSPITAL_BASED_OUTPATIENT_CLINIC_OR_DEPARTMENT_OTHER): Payer: Medicare Other

## 2013-12-25 ENCOUNTER — Ambulatory Visit (HOSPITAL_BASED_OUTPATIENT_CLINIC_OR_DEPARTMENT_OTHER): Payer: Medicare Other | Admitting: Hematology & Oncology

## 2013-12-25 ENCOUNTER — Other Ambulatory Visit (HOSPITAL_BASED_OUTPATIENT_CLINIC_OR_DEPARTMENT_OTHER): Payer: Medicare Other | Admitting: Lab

## 2013-12-25 VITALS — Ht 66.0 in

## 2013-12-25 VITALS — BP 117/70 | HR 72 | Temp 98.1°F | Resp 16

## 2013-12-25 DIAGNOSIS — E859 Amyloidosis, unspecified: Secondary | ICD-10-CM | POA: Diagnosis not present

## 2013-12-25 LAB — CMP (CANCER CENTER ONLY)
ALT(SGPT): 16 U/L (ref 10–47)
AST: 16 U/L (ref 11–38)
Albumin: 3.5 g/dL (ref 3.3–5.5)
Alkaline Phosphatase: 80 U/L (ref 26–84)
BUN, Bld: 17 mg/dL (ref 7–22)
CO2: 27 mEq/L (ref 18–33)
Calcium: 9.5 mg/dL (ref 8.0–10.3)
Chloride: 105 mEq/L (ref 98–108)
Creat: 1.1 mg/dl (ref 0.6–1.2)
Glucose, Bld: 98 mg/dL (ref 73–118)
Potassium: 3.6 mEq/L (ref 3.3–4.7)
Sodium: 141 mEq/L (ref 128–145)
Total Bilirubin: 0.7 mg/dl (ref 0.20–1.60)
Total Protein: 6.9 g/dL (ref 6.4–8.1)

## 2013-12-25 LAB — CBC WITH DIFFERENTIAL (CANCER CENTER ONLY)
BASO#: 0 10*3/uL (ref 0.0–0.2)
BASO%: 0.4 % (ref 0.0–2.0)
EOS%: 2.6 % (ref 0.0–7.0)
Eosinophils Absolute: 0.2 10*3/uL (ref 0.0–0.5)
HCT: 43 % (ref 38.7–49.9)
HGB: 14.9 g/dL (ref 13.0–17.1)
LYMPH#: 1.2 10*3/uL (ref 0.9–3.3)
LYMPH%: 17.5 % (ref 14.0–48.0)
MCH: 33.3 pg (ref 28.0–33.4)
MCHC: 34.7 g/dL (ref 32.0–35.9)
MCV: 96 fL (ref 82–98)
MONO#: 0.4 10*3/uL (ref 0.1–0.9)
MONO%: 5.9 % (ref 0.0–13.0)
NEUT#: 5 10*3/uL (ref 1.5–6.5)
NEUT%: 73.6 % (ref 40.0–80.0)
Platelets: 253 10*3/uL (ref 145–400)
RBC: 4.47 10*6/uL (ref 4.20–5.70)
RDW: 14 % (ref 11.1–15.7)
WBC: 6.8 10*3/uL (ref 4.0–10.0)

## 2013-12-25 MED ORDER — ONDANSETRON HCL 8 MG PO TABS
8.0000 mg | ORAL_TABLET | Freq: Once | ORAL | Status: AC
  Administered 2013-12-25: 8 mg via ORAL

## 2013-12-25 MED ORDER — ONDANSETRON HCL 8 MG PO TABS
ORAL_TABLET | ORAL | Status: AC
  Filled 2013-12-25: qty 1

## 2013-12-25 MED ORDER — FAMCICLOVIR 500 MG PO TABS: 500.0000 mg | ORAL_TABLET | Freq: Three times a day (TID) | ORAL | Status: DC

## 2013-12-25 MED ORDER — BORTEZOMIB CHEMO SQ INJECTION 3.5 MG (2.5MG/ML)
1.5000 mg/m2 | Freq: Once | INTRAMUSCULAR | Status: AC
  Administered 2013-12-25: 2.75 mg via SUBCUTANEOUS
  Filled 2013-12-25: qty 2.75

## 2013-12-25 MED ORDER — ZOLPIDEM TARTRATE 5 MG PO TABS: 2.5000 mg | ORAL_TABLET | Freq: Every evening | ORAL | Status: DC | PRN

## 2013-12-25 NOTE — Patient Instructions (Signed)
[facility] Discharge Instructions for Patients Receiving Chemotherapy  Today you received the following chemotherapy agents: Velcade. To help prevent nausea and vomiting after your treatment, we encourage you to take your nausea medication.  If you develop nausea and vomiting that is not controlled by your nausea medication, call the clinic.   BELOW ARE SYMPTOMS THAT SHOULD BE REPORTED IMMEDIATELY:  *FEVER GREATER THAN 100.5 F  *CHILLS WITH OR WITHOUT FEVER  NAUSEA AND VOMITING THAT IS NOT CONTROLLED WITH YOUR NAUSEA MEDICATION  *UNUSUAL SHORTNESS OF BREATH  *UNUSUAL BRUISING OR BLEEDING  TENDERNESS IN MOUTH AND THROAT WITH OR WITHOUT PRESENCE OF ULCERS  *URINARY PROBLEMS  *BOWEL PROBLEMS  UNUSUAL RASH Items with * indicate a potential emergency and should be followed up as soon as possible.  Feel free to call the clinic you have any questions or concerns. The clinic phone number is [phone].    

## 2013-12-26 NOTE — Progress Notes (Signed)
Hematology and Oncology Follow Up Visit  William Barron [id number] [birth year] 68 y.o. 12/26/2013   Principle Diagnosis:   IgA Kappa myeloma with amyloidosis  Current Therapy:    Weekly Velcade with oral Cytoxan/Decadron     Interim History:  William Barron is in for followup. He is a 68 year old gentleman. He was seen previously at the main [facility]. He's doing well with treatment. So far, he's had no complications. He is followed at [facility]. He does next week for another evaluation. He is ultimately going to be considered for a bone marrow transplant.   He really has had no problems with side effects. He's not lost his hair. He's had a little nausea vomiting. He still working. He is doing well so far.   He feels okay. He's had no problems with pain. There is no cough. He's had no shortness of breath. There is no diarrhea. He has had no rashes. He's had no headache. There is no visual issues.  His last lites showed kappa light chain at 3.22 mg/dL. Previously, it was 6.32 mg/dL.  His performance status is ECOG 1.  Medications: Current outpatient prescriptions:clorazepate (TRANXENE) 3.75 MG tablet, Take 1 tablet, IF NEEDED, every 6 hours for agitation., Disp: 60 tablet, Rfl: 0;  cyclophosphamide (CYTOXAN) 50 MG tablet, Take 11 tablets weekly on an empty stomach 1 hour before or 2 hours after meals., Disp: 50 tablet, Rfl: 2;  dexamethasone (DECADRON) 4 MG tablet, Take 5 tablets weekly with Chemotherapy., Disp: 100 tablet, Rfl: 2 famciclovir (FAMVIR) 500 MG tablet, Take 1 tablet (500 mg total) by mouth 3 (three) times daily., Disp: 30 tablet, Rfl: 5;  fluconazole (DIFLUCAN) 200 MG tablet, Take 200 mg by mouth every 7 (seven) days., Disp: , Rfl: ;  minoxidil (ROGAINE) 2 % external solution, Apply 1 application topically daily. , Disp: , Rfl:  prochlorperazine (COMPAZINE) 10 MG tablet, Take 1 tablet (10 mg total) by mouth every 6 (six) hours as needed for nausea or vomiting., Disp: 30  tablet, Rfl: 3;  Tamsulosin HCl (FLOMAX) 0.4 MG CAPS, Take 0.4 mg by mouth 2 (two) times daily. , Disp: , Rfl: ;  Testosterone (ANDROGEL PUMP TD), Place 3 sprays onto the skin daily. daily, Disp: , Rfl:  zolpidem (AMBIEN) 5 MG tablet, Take 0.5-1 tablets (2.5-5 mg total) by mouth at bedtime as needed for sleep., Disp: 30 tablet, Rfl: 2  Allergies:  Allergies  Allergen Reactions  . Penicillins Swelling and Rash    Past Medical History, Surgical history, Social history, and Family History were reviewed and updated.  Review of Systems: As above  Physical Exam:  height is 5\' 6"  (1.676 m).   Well-developed well-nourished white gentleman. Head and neck exam shows no ocular or oral lesions. Has no palpable cervical or supraclavicular lymph nodes. Lungs are clear. Cardiac exam regular rhythm with no murmurs rubs or bruits. Abdomen soft. Good bowel sounds. There is no fluid wave. Is no palpable liver or spleen tip. Extremities shows no clubbing, cyanosis or edema. Has good strength. Has good range of motion of his joints. Skin exam no rashes, ecchymosis or petechia. Neurological exam shows no focal neurological deficits.  Lab Results  Component Value Date   WBC 6.8 12/25/2013   HGB 14.9 12/25/2013   HCT 43.0 12/25/2013   MCV 96 12/25/2013   PLT 253 12/25/2013     Chemistry      Component Value Date/Time   NA 141 12/25/2013 0921   NA 138 11/13/2013 1003  NA 141 04/17/2013 1044   K 3.6 12/25/2013 0921   K 4.0 11/13/2013 1003   K 4.1 04/17/2013 1044   CL 105 12/25/2013 0921   CL 105 11/13/2013 1003   CO2 27 12/25/2013 0921   CO2 26 11/13/2013 1003   CO2 28 04/17/2013 1044   BUN 17 12/25/2013 0921   BUN 22 11/13/2013 1003   BUN 17.3 04/17/2013 1044   CREATININE 1.1 12/25/2013 0921   CREATININE 1.14 11/13/2013 1003   CREATININE 1.1 04/17/2013 1044      Component Value Date/Time   CALCIUM 9.5 12/25/2013 0921   CALCIUM 9.3 11/13/2013 1003   CALCIUM 9.8 04/17/2013 1044   ALKPHOS 80 12/25/2013 0921    ALKPHOS 70 11/13/2013 1003   ALKPHOS 89 04/17/2013 1044   AST 16 12/25/2013 0921   AST 14 11/13/2013 1003   AST 16 04/17/2013 1044   ALT 16 12/25/2013 0921   ALT 13 11/13/2013 1003   ALT 17 04/17/2013 1044   BILITOT 0.70 12/25/2013 0921   BILITOT 0.4 11/13/2013 1003   BILITOT 0.45 04/17/2013 1044      Monoclonal spike is her 0.66 g/L. IgA level is 663 mg/dL. Kappa light chain is 3.61 mg/dL   Impression and Plan: William Barron is 68 year old gentleman with amyloidosis. He has an IgA kappa monoclonal protein. We will have to monitor this.   That he was back to see the specialist at [facility] in October. I am not sure when he will be set up for a stem cell transplant.  We will monitor his blood work weekly with his chemotherapy.  Want to see her back in about one month. I will go ahead and redo his urine. I'll get his serum studies.     Volanda Napoleon, MD 07-Nov-[id number]:21 AM

## 2013-12-31 ENCOUNTER — Other Ambulatory Visit: Payer: Self-pay | Admitting: Nurse Practitioner

## 2014-01-01 DIAGNOSIS — R591 Generalized enlarged lymph nodes: Secondary | ICD-10-CM | POA: Diagnosis not present

## 2014-01-01 DIAGNOSIS — I371 Nonrheumatic pulmonary valve insufficiency: Secondary | ICD-10-CM | POA: Diagnosis not present

## 2014-01-01 DIAGNOSIS — E858 Other amyloidosis: Secondary | ICD-10-CM | POA: Diagnosis not present

## 2014-01-01 DIAGNOSIS — C9000 Multiple myeloma not having achieved remission: Secondary | ICD-10-CM | POA: Diagnosis not present

## 2014-01-01 DIAGNOSIS — I517 Cardiomegaly: Secondary | ICD-10-CM | POA: Diagnosis not present

## 2014-01-01 DIAGNOSIS — G4700 Insomnia, unspecified: Secondary | ICD-10-CM | POA: Diagnosis not present

## 2014-01-01 DIAGNOSIS — E559 Vitamin D deficiency, unspecified: Secondary | ICD-10-CM | POA: Diagnosis not present

## 2014-01-01 DIAGNOSIS — Z5111 Encounter for antineoplastic chemotherapy: Secondary | ICD-10-CM | POA: Diagnosis not present

## 2014-01-01 DIAGNOSIS — R931 Abnormal findings on diagnostic imaging of heart and coronary circulation: Secondary | ICD-10-CM | POA: Diagnosis not present

## 2014-01-01 DIAGNOSIS — E854 Organ-limited amyloidosis: Secondary | ICD-10-CM | POA: Diagnosis not present

## 2014-01-02 DIAGNOSIS — C9000 Multiple myeloma not having achieved remission: Secondary | ICD-10-CM | POA: Insufficient documentation

## 2014-01-02 DIAGNOSIS — Z5111 Encounter for antineoplastic chemotherapy: Secondary | ICD-10-CM | POA: Insufficient documentation

## 2014-01-02 DIAGNOSIS — E559 Vitamin D deficiency, unspecified: Secondary | ICD-10-CM | POA: Insufficient documentation

## 2014-01-09 ENCOUNTER — Ambulatory Visit (HOSPITAL_BASED_OUTPATIENT_CLINIC_OR_DEPARTMENT_OTHER): Payer: Medicare Other | Admitting: Lab

## 2014-01-09 ENCOUNTER — Ambulatory Visit (HOSPITAL_BASED_OUTPATIENT_CLINIC_OR_DEPARTMENT_OTHER): Payer: Medicare Other

## 2014-01-09 ENCOUNTER — Ambulatory Visit (HOSPITAL_BASED_OUTPATIENT_CLINIC_OR_DEPARTMENT_OTHER): Payer: Medicare Other | Admitting: Hematology & Oncology

## 2014-01-09 VITALS — BP 126/69 | HR 79 | Temp 98.3°F | Resp 18 | Ht 66.0 in | Wt 163.0 lb

## 2014-01-09 DIAGNOSIS — E859 Amyloidosis, unspecified: Secondary | ICD-10-CM | POA: Diagnosis not present

## 2014-01-09 DIAGNOSIS — Z5112 Encounter for antineoplastic immunotherapy: Secondary | ICD-10-CM

## 2014-01-09 LAB — CMP (CANCER CENTER ONLY)
ALT(SGPT): 24 U/L (ref 10–47)
AST: 18 U/L (ref 11–38)
Albumin: 3.4 g/dL (ref 3.3–5.5)
Alkaline Phosphatase: 82 U/L (ref 26–84)
BUN, Bld: 18 mg/dL (ref 7–22)
CO2: 24 mEq/L (ref 18–33)
Calcium: 8.9 mg/dL (ref 8.0–10.3)
Chloride: 102 mEq/L (ref 98–108)
Creat: 1.1 mg/dl (ref 0.6–1.2)
Glucose, Bld: 130 mg/dL — ABNORMAL HIGH (ref 73–118)
Potassium: 3.4 mEq/L (ref 3.3–4.7)
Sodium: 141 mEq/L (ref 128–145)
Total Bilirubin: 0.6 mg/dl (ref 0.20–1.60)
Total Protein: 6.8 g/dL (ref 6.4–8.1)

## 2014-01-09 LAB — CBC WITH DIFFERENTIAL (CANCER CENTER ONLY)
BASO#: 0.1 10*3/uL (ref 0.0–0.2)
BASO%: 0.9 % (ref 0.0–2.0)
EOS%: 4.9 % (ref 0.0–7.0)
Eosinophils Absolute: 0.3 10*3/uL (ref 0.0–0.5)
HCT: 41.3 % (ref 38.7–49.9)
HGB: 14.5 g/dL (ref 13.0–17.1)
LYMPH#: 1.3 10*3/uL (ref 0.9–3.3)
LYMPH%: 22.9 % (ref 14.0–48.0)
MCH: 33.3 pg (ref 28.0–33.4)
MCHC: 35.1 g/dL (ref 32.0–35.9)
MCV: 95 fL (ref 82–98)
MONO#: 0.6 10*3/uL (ref 0.1–0.9)
MONO%: 10.4 % (ref 0.0–13.0)
NEUT#: 3.3 10*3/uL (ref 1.5–6.5)
NEUT%: 60.9 % (ref 40.0–80.0)
Platelets: 266 10*3/uL (ref 145–400)
RBC: 4.36 10*6/uL (ref 4.20–5.70)
RDW: 14.2 % (ref 11.1–15.7)
WBC: 5.5 10*3/uL (ref 4.0–10.0)

## 2014-01-09 MED ORDER — ZOLEDRONIC ACID 4 MG/100ML IV SOLN
4.0000 mg | Freq: Once | INTRAVENOUS | Status: AC
  Administered 2014-01-09: 4 mg via INTRAVENOUS
  Filled 2014-01-09: qty 100

## 2014-01-09 MED ORDER — ONDANSETRON HCL 8 MG PO TABS
ORAL_TABLET | ORAL | Status: AC
  Filled 2014-01-09: qty 1

## 2014-01-09 MED ORDER — ONDANSETRON HCL 8 MG PO TABS
8.0000 mg | ORAL_TABLET | Freq: Once | ORAL | Status: AC
  Administered 2014-01-09: 8 mg via ORAL

## 2014-01-09 MED ORDER — SODIUM CHLORIDE 0.9 % IV SOLN
Freq: Once | INTRAVENOUS | Status: AC
  Administered 2014-01-09: 10:00:00 via INTRAVENOUS

## 2014-01-09 MED ORDER — BORTEZOMIB CHEMO SQ INJECTION 3.5 MG (2.5MG/ML)
1.5000 mg/m2 | Freq: Once | INTRAMUSCULAR | Status: AC
  Administered 2014-01-09: 2.75 mg via SUBCUTANEOUS
  Filled 2014-01-09: qty 2.75

## 2014-01-09 NOTE — Patient Instructions (Signed)
[facility] Discharge Instructions for Patients Receiving Chemotherapy  Today you received the following chemotherapy agents Velcade and zometa  To help prevent nausea and vomiting after your treatment, we encourage you to take your nausea medication    If you develop nausea and vomiting that is not controlled by your nausea medication, call the clinic.   BELOW ARE SYMPTOMS THAT SHOULD BE REPORTED IMMEDIATELY:  *FEVER GREATER THAN 100.5 F  *CHILLS WITH OR WITHOUT FEVER  NAUSEA AND VOMITING THAT IS NOT CONTROLLED WITH YOUR NAUSEA MEDICATION  *UNUSUAL SHORTNESS OF BREATH  *UNUSUAL BRUISING OR BLEEDING  TENDERNESS IN MOUTH AND THROAT WITH OR WITHOUT PRESENCE OF ULCERS  *URINARY PROBLEMS  *BOWEL PROBLEMS  UNUSUAL RASH Items with * indicate a potential emergency and should be followed up as soon as possible.  Feel free to call the clinic you have any questions or concerns. The clinic phone number is [phone].   Zoledronic Acid injection (Hypercalcemia, Oncology) What is this medicine? ZOLEDRONIC ACID (ZOE le dron ik AS id) lowers the amount of calcium loss from bone. It is used to treat too much calcium in your blood from cancer. It is also used to prevent complications of cancer that has spread to the bone. This medicine may be used for other purposes; ask your health care provider or pharmacist if you have questions. COMMON BRAND NAME(S): Zometa What should I tell my health care provider before I take this medicine? They need to know if you have any of these conditions: -aspirin-sensitive asthma -cancer, especially if you are receiving medicines used to treat cancer -dental disease or wear dentures -infection -kidney disease -receiving corticosteroids like dexamethasone or prednisone -an unusual or allergic reaction to zoledronic acid, other medicines, foods, dyes, or preservatives -pregnant or trying to get pregnant -breast-feeding How should I use  this medicine? This medicine is for infusion into a vein. It is given by a health care professional in a hospital or clinic setting. Talk to your pediatrician regarding the use of this medicine in children. Special care may be needed. Overdosage: If you think you have taken too much of this medicine contact a poison control center or emergency room at once. NOTE: This medicine is only for you. Do not share this medicine with others. What if I miss a dose? It is important not to miss your dose. Call your doctor or health care professional if you are unable to keep an appointment. What may interact with this medicine? -certain antibiotics given by injection -NSAIDs, medicines for pain and inflammation, like ibuprofen or naproxen -some diuretics like bumetanide, furosemide -teriparatide -thalidomide This list may not describe all possible interactions. Give your health care provider a list of all the medicines, herbs, non-prescription drugs, or dietary supplements you use. Also tell them if you smoke, drink alcohol, or use illegal drugs. Some items may interact with your medicine. What should I watch for while using this medicine? Visit your doctor or health care professional for regular checkups. It may be some time before you see the benefit from this medicine. Do not stop taking your medicine unless your doctor tells you to. Your doctor may order blood tests or other tests to see how you are doing. Women should inform their doctor if they wish to become pregnant or think they might be pregnant. There is a potential for serious side effects to an unborn child. Talk to your health care professional or pharmacist for more information. You should make sure that you  get enough calcium and vitamin D while you are taking this medicine. Discuss the foods you eat and the vitamins you take with your health care professional. Some people who take this medicine have severe bone, joint, and/or muscle pain. This  medicine may also increase your risk for jaw problems or a broken thigh bone. Tell your doctor right away if you have severe pain in your jaw, bones, joints, or muscles. Tell your doctor if you have any pain that does not go away or that gets worse. Tell your dentist and dental surgeon that you are taking this medicine. You should not have major dental surgery while on this medicine. See your dentist to have a dental exam and fix any dental problems before starting this medicine. Take good care of your teeth while on this medicine. Make sure you see your dentist for regular follow-up appointments. What side effects may I notice from receiving this medicine? Side effects that you should report to your doctor or health care professional as soon as possible: -allergic reactions like skin rash, itching or hives, swelling of the face, lips, or tongue -anxiety, confusion, or depression -breathing problems -changes in vision -eye pain -feeling faint or lightheaded, falls -jaw pain, especially after dental work -mouth sores -muscle cramps, stiffness, or weakness -trouble passing urine or change in the amount of urine Side effects that usually do not require medical attention (report to your doctor or health care professional if they continue or are bothersome): -bone, joint, or muscle pain -constipation -diarrhea -fever -hair loss -irritation at site where injected -loss of appetite -nausea, vomiting -stomach upset -trouble sleeping -trouble swallowing -weak or tired This list may not describe all possible side effects. Call your doctor for medical advice about side effects. You may report side effects to FDA at 1-800-FDA-1088. Where should I keep my medicine? This drug is given in a hospital or clinic and will not be stored at home. NOTE: This sheet is a summary. It may not cover all possible information. If you have questions about this medicine, talk to your doctor, pharmacist, or health  care provider.  2015, Elsevier/Gold Standard. (2012-08-10 13:03:13)

## 2014-01-11 LAB — PROTEIN ELECTROPHORESIS, SERUM, WITH REFLEX
Albumin ELP: 56.3 % (ref 55.8–66.1)
Alpha-1-Globulin: 7.1 % — ABNORMAL HIGH (ref 2.9–4.9)
Alpha-2-Globulin: 9.6 % (ref 7.1–11.8)
Beta 2: 4.4 % (ref 3.2–6.5)
Beta Globulin: 5.9 % (ref 4.7–7.2)
Gamma Globulin: 16.7 % (ref 11.1–18.8)
M-Spike, %: 0.72 g/dL
Total Protein, Serum Electrophoresis: 6.7 g/dL (ref 6.0–8.3)

## 2014-01-11 LAB — IGG, IGA, IGM
IgA: 729 mg/dL — ABNORMAL HIGH (ref 68–379)
IgG (Immunoglobin G), Serum: 386 mg/dL — ABNORMAL LOW (ref 650–1600)
IgM, Serum: 10 mg/dL — ABNORMAL LOW (ref 41–251)

## 2014-01-11 LAB — KAPPA/LAMBDA LIGHT CHAINS
Kappa free light chain: 3.96 mg/dL — ABNORMAL HIGH (ref 0.33–1.94)
Kappa:Lambda Ratio: 4.89 — ABNORMAL HIGH (ref 0.26–1.65)
Lambda Free Lght Chn: 0.81 mg/dL (ref 0.57–2.63)

## 2014-01-11 LAB — IFE INTERPRETATION

## 2014-01-11 NOTE — Progress Notes (Signed)
Hematology and Oncology Follow Up Visit  William Barron [id number] [birth year] 68 y.o. 01/11/2014   Principle Diagnosis:   IgA Kappa myeloma with amyloidosis  Current Therapy:    Weekly Velcade with oral Cytoxan/Decadron     Interim History:  Mr.  William Barron is in for followup. He was seen at [facility] last week. They talked to him about a autologous stem cell transplant. I really think this would be an excellent idea for him. I think he will agree to this.  They want him to be on Zometa. I will order this.  They also recommended aspirin. This is fine by me.  He is tolerating treatment well. He is still working. He's had no nausea vomiting. Decreasing the Decadron dose has helped his overall personality. He is not as agitated. He really has had no problems with side effects. He's not lost his hair. He's had a little nausea or vomiting.    He's had no problems with pain. There is no cough. He feels okaye's had no shortness of breath. There is no diarrhea. He has had no rashes. He's had no headache. There is no visual issues.  His last myeloma studies showed kappa light chain at 3.61. mg/dL. Previously, it was 3.2 to mg/dL. his IgA level was 663 mg/dL.  His performance status is ECOG 1.  Medications: Current outpatient prescriptions:cyclophosphamide (CYTOXAN) 50 MG tablet, Take 11 tablets weekly on an empty stomach 1 hour before or 2 hours after meals., Disp: 50 tablet, Rfl: 2;  dexamethasone (DECADRON) 4 MG tablet, Take 5 tablets weekly with Chemotherapy., Disp: 100 tablet, Rfl: 2;  famciclovir (FAMVIR) 500 MG tablet, Take 1 tablet (500 mg total) by mouth 3 (three) times daily., Disp: 30 tablet, Rfl: 5 fluconazole (DIFLUCAN) 200 MG tablet, Take 200 mg by mouth every 7 (seven) days., Disp: , Rfl: ;  minoxidil (ROGAINE) 2 % external solution, Apply 1 application topically daily. , Disp: , Rfl: ;  Tamsulosin HCl (FLOMAX) 0.4 MG CAPS, Take 0.4 mg by mouth 2 (two) times daily. , Disp: , Rfl: ;   Testosterone (ANDROGEL PUMP TD), Place 3 sprays onto the skin daily. daily, Disp: , Rfl:  zolpidem (AMBIEN) 5 MG tablet, Take 0.5-1 tablets (2.5-5 mg total) by mouth at bedtime as needed for sleep., Disp: 30 tablet, Rfl: 2;  clorazepate (TRANXENE) 3.75 MG tablet, Take 1 tablet, IF NEEDED, every 6 hours for agitation., Disp: 60 tablet, Rfl: 0;  prochlorperazine (COMPAZINE) 10 MG tablet, Take 1 tablet (10 mg total) by mouth every 6 (six) hours as needed for nausea or vomiting., Disp: 30 tablet, Rfl: 3  Allergies:  Allergies  Allergen Reactions  . Penicillins Swelling and Rash    Past Medical History, Surgical history, Social history, and Family History were reviewed and updated.  Review of Systems: As above  Physical Exam:  height is 5\' 6"  (1.676 m) and weight is 163 lb (73.936 kg). His oral temperature is 98.3 F (36.8 C). His blood pressure is 126/69 and his pulse is 79. His respiration is 18.   Well-developed well-nourished white gentleman. Head and neck exam shows no ocular or oral lesions. Has no palpable cervical or supraclavicular lymph nodes. Lungs are clear. Cardiac exam regular rhythm with no murmurs rubs or bruits. Abdomen soft. Good bowel sounds. There is no fluid wave. Is no palpable liver or spleen tip. Extremities shows no clubbing, cyanosis or edema. Has good strength. Has good range of motion of his joints. Skin exam no rashes, ecchymosis or petechia.  Neurological exam shows no focal neurological deficits.  Lab Results  Component Value Date   WBC 5.5 01/09/2014   HGB 14.5 01/09/2014   HCT 41.3 01/09/2014   MCV 95 01/09/2014   PLT 266 01/09/2014     Chemistry      Component Value Date/Time   NA 141 01/09/2014 0839   NA 138 11/13/2013 1003   NA 141 04/17/2013 1044   K 3.4 01/09/2014 0839   K 4.0 11/13/2013 1003   K 4.1 04/17/2013 1044   CL 102 01/09/2014 0839   CL 105 11/13/2013 1003   CO2 24 01/09/2014 0839   CO2 26 11/13/2013 1003   CO2 28 04/17/2013 1044   BUN 18  01/09/2014 0839   BUN 22 11/13/2013 1003   BUN 17.3 04/17/2013 1044   CREATININE 1.1 01/09/2014 0839   CREATININE 1.14 11/13/2013 1003   CREATININE 1.1 04/17/2013 1044      Component Value Date/Time   CALCIUM 8.9 01/09/2014 0839   CALCIUM 9.3 11/13/2013 1003   CALCIUM 9.8 04/17/2013 1044   ALKPHOS 82 01/09/2014 0839   ALKPHOS 70 11/13/2013 1003   ALKPHOS 89 04/17/2013 1044   AST 18 01/09/2014 0839   AST 14 11/13/2013 1003   AST 16 04/17/2013 1044   ALT 24 01/09/2014 0839   ALT 13 11/13/2013 1003   ALT 17 04/17/2013 1044   BILITOT 0.60 01/09/2014 0839   BILITOT 0.4 11/13/2013 1003   BILITOT 0.45 04/17/2013 1044        Impression and Plan: Mr. William Barron is 68 year old gentleman with IgA myeloma with amyloidosis.   We will continue him on therapy. We will ultimately get him to autologous transplant.Marland Kitchen   He will go back to see the transplant doctors at [facility] in November. I am not sure when he will be set up for a stem cell transplant.  We will monitor his blood work weekly with his chemotherapy.  I will want to see her back in about one month. I will go ahead and recheck his urine. I'll get his serum studies.     Volanda Napoleon, MD 28-Nov-20157:30 AM

## 2014-01-16 ENCOUNTER — Ambulatory Visit (HOSPITAL_BASED_OUTPATIENT_CLINIC_OR_DEPARTMENT_OTHER): Payer: Medicare Other

## 2014-01-16 DIAGNOSIS — Z5112 Encounter for antineoplastic immunotherapy: Secondary | ICD-10-CM

## 2014-01-16 DIAGNOSIS — C9000 Multiple myeloma not having achieved remission: Secondary | ICD-10-CM | POA: Diagnosis not present

## 2014-01-16 DIAGNOSIS — E859 Amyloidosis, unspecified: Secondary | ICD-10-CM

## 2014-01-16 MED ORDER — ONDANSETRON HCL 8 MG PO TABS
8.0000 mg | ORAL_TABLET | Freq: Once | ORAL | Status: AC
  Administered 2014-01-16: 8 mg via ORAL

## 2014-01-16 MED ORDER — BORTEZOMIB CHEMO SQ INJECTION 3.5 MG (2.5MG/ML)
1.5000 mg/m2 | Freq: Once | INTRAMUSCULAR | Status: AC
  Administered 2014-01-16: 2.75 mg via SUBCUTANEOUS
  Filled 2014-01-16: qty 2.75

## 2014-01-16 MED ORDER — ONDANSETRON HCL 8 MG PO TABS
ORAL_TABLET | ORAL | Status: AC
  Filled 2014-01-16: qty 1

## 2014-01-16 NOTE — Patient Instructions (Signed)
Bortezomib injection What is this medicine? BORTEZOMIB (bor TEZ oh mib) is a chemotherapy drug. It slows the growth of cancer cells. This medicine is used to treat multiple myeloma, and certain lymphomas, such as mantle-cell lymphoma. This medicine may be used for other purposes; ask your health care provider or pharmacist if you have questions. COMMON BRAND NAME(S): Velcade What should I tell my health care provider before I take this medicine? They need to know if you have any of these conditions: -diabetes -heart disease -irregular heartbeat -liver disease -on hemodialysis -low blood counts, like low white blood cells, platelets, or hemoglobin -peripheral neuropathy -taking medicine for blood pressure -an unusual or allergic reaction to bortezomib, mannitol, boron, other medicines, foods, dyes, or preservatives -pregnant or trying to get pregnant -breast-feeding How should I use this medicine? This medicine is for injection into a vein or for injection under the skin. It is given by a health care professional in a hospital or clinic setting. Talk to your pediatrician regarding the use of this medicine in children. Special care may be needed. Overdosage: If you think you have taken too much of this medicine contact a poison control center or emergency room at once. NOTE: This medicine is only for you. Do not share this medicine with others. What if I miss a dose? It is important not to miss your dose. Call your doctor or health care professional if you are unable to keep an appointment. What may interact with this medicine? This medicine may interact with the following medications: -ketoconazole -rifampin -ritonavir -St. John's Wort This list may not describe all possible interactions. Give your health care provider a list of all the medicines, herbs, non-prescription drugs, or dietary supplements you use. Also tell them if you smoke, drink alcohol, or use illegal drugs. Some items  may interact with your medicine. What should I watch for while using this medicine? Visit your doctor for checks on your progress. This drug may make you feel generally unwell. This is not uncommon, as chemotherapy can affect healthy cells as well as cancer cells. Report any side effects. Continue your course of treatment even though you feel ill unless your doctor tells you to stop. You may get drowsy or dizzy. Do not drive, use machinery, or do anything that needs mental alertness until you know how this medicine affects you. Do not stand or sit up quickly, especially if you are an older patient. This reduces the risk of dizzy or fainting spells. In some cases, you may be given additional medicines to help with side effects. Follow all directions for their use. Call your doctor or health care professional for advice if you get a fever, chills or sore throat, or other symptoms of a cold or flu. Do not treat yourself. This drug decreases your body's ability to fight infections. Try to avoid being around people who are sick. This medicine may increase your risk to bruise or bleed. Call your doctor or health care professional if you notice any unusual bleeding. You may need blood work done while you are taking this medicine. In some patients, this medicine may cause a serious brain infection that may cause death. If you have any problems seeing, thinking, speaking, walking, or standing, tell your doctor right away. If you cannot reach your doctor, urgently seek other source of medical care. Do not become pregnant while taking this medicine. Women should inform their doctor if they wish to become pregnant or think they might be pregnant. There is   a potential for serious side effects to an unborn child. Talk to your health care professional or pharmacist for more information. Do not breast-feed an infant while taking this medicine. Check with your doctor or health care professional if you get an attack of  severe diarrhea, nausea and vomiting, or if you sweat a lot. The loss of too much body fluid can make it dangerous for you to take this medicine. What side effects may I notice from receiving this medicine? Side effects that you should report to your doctor or health care professional as soon as possible: -allergic reactions like skin rash, itching or hives, swelling of the face, lips, or tongue -breathing problems -changes in hearing -changes in vision -fast, irregular heartbeat -feeling faint or lightheaded, falls -pain, tingling, numbness in the hands or feet -right upper belly pain -seizures -swelling of the ankles, feet, hands -unusual bleeding or bruising -unusually weak or tired -vomiting -yellowing of the eyes or skin Side effects that usually do not require medical attention (report to your doctor or health care professional if they continue or are bothersome): -changes in emotions or moods -constipation -diarrhea -loss of appetite -headache -irritation at site where injected -nausea This list may not describe all possible side effects. Call your doctor for medical advice about side effects. You may report side effects to FDA at 1-800-FDA-1088. Where should I keep my medicine? This drug is given in a hospital or clinic and will not be stored at home. NOTE: This sheet is a summary. It may not cover all possible information. If you have questions about this medicine, talk to your doctor, pharmacist, or health care provider.  2015, [employer] Standard. (2012-12-25 12:46:32)  

## 2014-01-17 DIAGNOSIS — M6789 Other specified disorders of synovium and tendon, multiple sites: Secondary | ICD-10-CM | POA: Diagnosis not present

## 2014-01-21 ENCOUNTER — Ambulatory Visit (HOSPITAL_BASED_OUTPATIENT_CLINIC_OR_DEPARTMENT_OTHER): Payer: Medicare Other

## 2014-01-21 DIAGNOSIS — Z5112 Encounter for antineoplastic immunotherapy: Secondary | ICD-10-CM | POA: Diagnosis not present

## 2014-01-21 DIAGNOSIS — E859 Amyloidosis, unspecified: Secondary | ICD-10-CM | POA: Diagnosis not present

## 2014-01-21 MED ORDER — ONDANSETRON HCL 8 MG PO TABS
ORAL_TABLET | ORAL | Status: AC
  Filled 2014-01-21: qty 1

## 2014-01-21 MED ORDER — ONDANSETRON HCL 8 MG PO TABS
8.0000 mg | ORAL_TABLET | Freq: Once | ORAL | Status: AC
  Administered 2014-01-21: 8 mg via ORAL

## 2014-01-21 MED ORDER — BORTEZOMIB CHEMO SQ INJECTION 3.5 MG (2.5MG/ML)
1.5000 mg/m2 | Freq: Once | INTRAMUSCULAR | Status: AC
  Administered 2014-01-21: 2.75 mg via SUBCUTANEOUS
  Filled 2014-01-21: qty 2.75

## 2014-01-21 NOTE — Patient Instructions (Signed)
Bortezomib injection What is this medicine? BORTEZOMIB (bor TEZ oh mib) is a chemotherapy drug. It slows the growth of cancer cells. This medicine is used to treat multiple myeloma, and certain lymphomas, such as mantle-cell lymphoma. This medicine may be used for other purposes; ask your health care provider or pharmacist if you have questions. COMMON BRAND NAME(S): Velcade What should I tell my health care provider before I take this medicine? They need to know if you have any of these conditions: -diabetes -heart disease -irregular heartbeat -liver disease -on hemodialysis -low blood counts, like low white blood cells, platelets, or hemoglobin -peripheral neuropathy -taking medicine for blood pressure -an unusual or allergic reaction to bortezomib, mannitol, boron, other medicines, foods, dyes, or preservatives -pregnant or trying to get pregnant -breast-feeding How should I use this medicine? This medicine is for injection into a vein or for injection under the skin. It is given by a health care professional in a hospital or clinic setting. Talk to your pediatrician regarding the use of this medicine in children. Special care may be needed. Overdosage: If you think you have taken too much of this medicine contact a poison control center or emergency room at once. NOTE: This medicine is only for you. Do not share this medicine with others. What if I miss a dose? It is important not to miss your dose. Call your doctor or health care professional if you are unable to keep an appointment. What may interact with this medicine? This medicine may interact with the following medications: -ketoconazole -rifampin -ritonavir -St. John's Wort This list may not describe all possible interactions. Give your health care provider a list of all the medicines, herbs, non-prescription drugs, or dietary supplements you use. Also tell them if you smoke, drink alcohol, or use illegal drugs. Some items  may interact with your medicine. What should I watch for while using this medicine? Visit your doctor for checks on your progress. This drug may make you feel generally unwell. This is not uncommon, as chemotherapy can affect healthy cells as well as cancer cells. Report any side effects. Continue your course of treatment even though you feel ill unless your doctor tells you to stop. You may get drowsy or dizzy. Do not drive, use machinery, or do anything that needs mental alertness until you know how this medicine affects you. Do not stand or sit up quickly, especially if you are an older patient. This reduces the risk of dizzy or fainting spells. In some cases, you may be given additional medicines to help with side effects. Follow all directions for their use. Call your doctor or health care professional for advice if you get a fever, chills or sore throat, or other symptoms of a cold or flu. Do not treat yourself. This drug decreases your body's ability to fight infections. Try to avoid being around people who are sick. This medicine may increase your risk to bruise or bleed. Call your doctor or health care professional if you notice any unusual bleeding. You may need blood work done while you are taking this medicine. In some patients, this medicine may cause a serious brain infection that may cause death. If you have any problems seeing, thinking, speaking, walking, or standing, tell your doctor right away. If you cannot reach your doctor, urgently seek other source of medical care. Do not become pregnant while taking this medicine. Women should inform their doctor if they wish to become pregnant or think they might be pregnant. There is   a potential for serious side effects to an unborn child. Talk to your health care professional or pharmacist for more information. Do not breast-feed an infant while taking this medicine. Check with your doctor or health care professional if you get an attack of  severe diarrhea, nausea and vomiting, or if you sweat a lot. The loss of too much body fluid can make it dangerous for you to take this medicine. What side effects may I notice from receiving this medicine? Side effects that you should report to your doctor or health care professional as soon as possible: -allergic reactions like skin rash, itching or hives, swelling of the face, lips, or tongue -breathing problems -changes in hearing -changes in vision -fast, irregular heartbeat -feeling faint or lightheaded, falls -pain, tingling, numbness in the hands or feet -right upper belly pain -seizures -swelling of the ankles, feet, hands -unusual bleeding or bruising -unusually weak or tired -vomiting -yellowing of the eyes or skin Side effects that usually do not require medical attention (report to your doctor or health care professional if they continue or are bothersome): -changes in emotions or moods -constipation -diarrhea -loss of appetite -headache -irritation at site where injected -nausea This list may not describe all possible side effects. Call your doctor for medical advice about side effects. You may report side effects to FDA at 1-800-FDA-1088. Where should I keep my medicine? This drug is given in a hospital or clinic and will not be stored at home. NOTE: This sheet is a summary. It may not cover all possible information. If you have questions about this medicine, talk to your doctor, pharmacist, or health care provider.  2015, [employer] Standard. (2012-12-25 12:46:32)  

## 2014-01-21 NOTE — Progress Notes (Signed)
Per Dr Marin Olp, pt is okay to receive Velcade today without drawing new labs.

## 2014-01-22 ENCOUNTER — Ambulatory Visit: Payer: Medicare Other

## 2014-01-22 ENCOUNTER — Ambulatory Visit: Payer: Medicare Other | Admitting: Hematology & Oncology

## 2014-01-22 ENCOUNTER — Other Ambulatory Visit: Payer: Medicare Other | Admitting: Lab

## 2014-01-23 ENCOUNTER — Ambulatory Visit: Payer: Medicare Other

## 2014-01-24 DIAGNOSIS — C9000 Multiple myeloma not having achieved remission: Secondary | ICD-10-CM | POA: Diagnosis not present

## 2014-01-24 DIAGNOSIS — R0602 Shortness of breath: Secondary | ICD-10-CM | POA: Diagnosis not present

## 2014-01-24 DIAGNOSIS — E859 Amyloidosis, unspecified: Secondary | ICD-10-CM | POA: Diagnosis not present

## 2014-01-29 ENCOUNTER — Other Ambulatory Visit: Payer: Medicare Other | Admitting: Lab

## 2014-01-29 ENCOUNTER — Ambulatory Visit: Payer: Medicare Other

## 2014-01-31 DIAGNOSIS — D1801 Hemangioma of skin and subcutaneous tissue: Secondary | ICD-10-CM | POA: Diagnosis not present

## 2014-01-31 DIAGNOSIS — Z85820 Personal history of malignant melanoma of skin: Secondary | ICD-10-CM | POA: Diagnosis not present

## 2014-01-31 DIAGNOSIS — L821 Other seborrheic keratosis: Secondary | ICD-10-CM | POA: Diagnosis not present

## 2014-02-01 DIAGNOSIS — C9000 Multiple myeloma not having achieved remission: Secondary | ICD-10-CM | POA: Diagnosis not present

## 2014-02-01 DIAGNOSIS — E858 Other amyloidosis: Secondary | ICD-10-CM | POA: Diagnosis not present

## 2014-02-04 DIAGNOSIS — E859 Amyloidosis, unspecified: Secondary | ICD-10-CM | POA: Diagnosis not present

## 2014-02-04 DIAGNOSIS — C9000 Multiple myeloma not having achieved remission: Secondary | ICD-10-CM | POA: Diagnosis not present

## 2014-02-04 DIAGNOSIS — R938 Abnormal findings on diagnostic imaging of other specified body structures: Secondary | ICD-10-CM | POA: Diagnosis not present

## 2014-02-13 ENCOUNTER — Other Ambulatory Visit: Payer: Medicare Other | Admitting: Lab

## 2014-02-13 ENCOUNTER — Ambulatory Visit: Payer: Medicare Other

## 2014-02-13 ENCOUNTER — Ambulatory Visit: Payer: Medicare Other | Admitting: Hematology & Oncology

## 2014-02-13 DIAGNOSIS — E8809 Other disorders of plasma-protein metabolism, not elsewhere classified: Secondary | ICD-10-CM | POA: Diagnosis not present

## 2014-02-19 ENCOUNTER — Encounter: Payer: Self-pay | Admitting: Nurse Practitioner

## 2014-02-19 NOTE — Progress Notes (Signed)
Spoke to PA with Dr. Samule Ohm @ [name]. Pt is to continue on current tx plan with Velcade/Zometa. She did state they were sending a referral to the [facility] to provide pt with second opinion.

## 2014-02-20 ENCOUNTER — Other Ambulatory Visit (HOSPITAL_BASED_OUTPATIENT_CLINIC_OR_DEPARTMENT_OTHER): Payer: Medicare Other | Admitting: Lab

## 2014-02-20 ENCOUNTER — Ambulatory Visit (HOSPITAL_BASED_OUTPATIENT_CLINIC_OR_DEPARTMENT_OTHER): Payer: Medicare Other

## 2014-02-20 ENCOUNTER — Encounter: Payer: Self-pay | Admitting: Hematology & Oncology

## 2014-02-20 VITALS — BP 141/70 | HR 68 | Temp 98.4°F | Resp 16

## 2014-02-20 DIAGNOSIS — E859 Amyloidosis, unspecified: Secondary | ICD-10-CM

## 2014-02-20 DIAGNOSIS — R591 Generalized enlarged lymph nodes: Secondary | ICD-10-CM | POA: Diagnosis not present

## 2014-02-20 DIAGNOSIS — Z5112 Encounter for antineoplastic immunotherapy: Secondary | ICD-10-CM | POA: Diagnosis not present

## 2014-02-20 DIAGNOSIS — R599 Enlarged lymph nodes, unspecified: Secondary | ICD-10-CM

## 2014-02-20 DIAGNOSIS — C9000 Multiple myeloma not having achieved remission: Secondary | ICD-10-CM | POA: Diagnosis not present

## 2014-02-20 LAB — CMP (CANCER CENTER ONLY)
ALT(SGPT): 25 U/L (ref 10–47)
AST: 24 U/L (ref 11–38)
Albumin: 3.5 g/dL (ref 3.3–5.5)
Alkaline Phosphatase: 70 U/L (ref 26–84)
BUN, Bld: 21 mg/dL (ref 7–22)
CO2: 28 mEq/L (ref 18–33)
Calcium: 8.7 mg/dL (ref 8.0–10.3)
Chloride: 106 mEq/L (ref 98–108)
Creat: 1.3 mg/dl — ABNORMAL HIGH (ref 0.6–1.2)
Glucose, Bld: 84 mg/dL (ref 73–118)
Potassium: 4.2 mEq/L (ref 3.3–4.7)
Sodium: 144 mEq/L (ref 128–145)
Total Bilirubin: 0.6 mg/dl (ref 0.20–1.60)
Total Protein: 7.1 g/dL (ref 6.4–8.1)

## 2014-02-20 LAB — CBC WITH DIFFERENTIAL (CANCER CENTER ONLY)
BASO#: 0.1 10*3/uL (ref 0.0–0.2)
BASO%: 1.1 % (ref 0.0–2.0)
EOS%: 2.9 % (ref 0.0–7.0)
Eosinophils Absolute: 0.2 10*3/uL (ref 0.0–0.5)
HCT: 41.1 % (ref 38.7–49.9)
HGB: 14.1 g/dL (ref 13.0–17.1)
LYMPH#: 1.4 10*3/uL (ref 0.9–3.3)
LYMPH%: 24.7 % (ref 14.0–48.0)
MCH: 33.4 pg (ref 28.0–33.4)
MCHC: 34.3 g/dL (ref 32.0–35.9)
MCV: 97 fL (ref 82–98)
MONO#: 0.6 10*3/uL (ref 0.1–0.9)
MONO%: 10.6 % (ref 0.0–13.0)
NEUT#: 3.3 10*3/uL (ref 1.5–6.5)
NEUT%: 60.7 % (ref 40.0–80.0)
Platelets: 229 10*3/uL (ref 145–400)
RBC: 4.22 10*6/uL (ref 4.20–5.70)
RDW: 14.5 % (ref 11.1–15.7)
WBC: 5.5 10*3/uL (ref 4.0–10.0)

## 2014-02-20 MED ORDER — SODIUM CHLORIDE 0.9 % IV SOLN
Freq: Once | INTRAVENOUS | Status: AC
  Administered 2014-02-20: 10:00:00 via INTRAVENOUS

## 2014-02-20 MED ORDER — BORTEZOMIB CHEMO SQ INJECTION 3.5 MG (2.5MG/ML)
1.5000 mg/m2 | Freq: Once | INTRAMUSCULAR | Status: AC
  Administered 2014-02-20: 2.75 mg via SUBCUTANEOUS
  Filled 2014-02-20: qty 2.75

## 2014-02-20 MED ORDER — ZOLEDRONIC ACID 4 MG/100ML IV SOLN
4.0000 mg | Freq: Once | INTRAVENOUS | Status: AC
  Administered 2014-02-20: 4 mg via INTRAVENOUS
  Filled 2014-02-20: qty 100

## 2014-02-20 MED ORDER — ONDANSETRON HCL 8 MG PO TABS
8.0000 mg | ORAL_TABLET | Freq: Once | ORAL | Status: AC
  Administered 2014-02-20: 8 mg via ORAL

## 2014-02-20 MED ORDER — ONDANSETRON HCL 8 MG PO TABS
ORAL_TABLET | ORAL | Status: AC
  Filled 2014-02-20: qty 1

## 2014-02-20 NOTE — Patient Instructions (Signed)
Zoledronic Acid injection (Hypercalcemia, Oncology) What is this medicine? ZOLEDRONIC ACID (ZOE le dron ik AS id) lowers the amount of calcium loss from bone. It is used to treat too much calcium in your blood from cancer. It is also used to prevent complications of cancer that has spread to the bone. This medicine may be used for other purposes; ask your health care provider or pharmacist if you have questions. COMMON BRAND NAME(S): Zometa What should I tell my health care provider before I take this medicine? They need to know if you have any of these conditions: -aspirin-sensitive asthma -cancer, especially if you are receiving medicines used to treat cancer -dental disease or wear dentures -infection -kidney disease -receiving corticosteroids like dexamethasone or prednisone -an unusual or allergic reaction to zoledronic acid, other medicines, foods, dyes, or preservatives -pregnant or trying to get pregnant -breast-feeding How should I use this medicine? This medicine is for infusion into a vein. It is given by a health care professional in a hospital or clinic setting. Talk to your pediatrician regarding the use of this medicine in children. Special care may be needed. Overdosage: If you think you have taken too much of this medicine contact a poison control center or emergency room at once. NOTE: This medicine is only for you. Do not share this medicine with others. What if I miss a dose? It is important not to miss your dose. Call your doctor or health care professional if you are unable to keep an appointment. What may interact with this medicine? -certain antibiotics given by injection -NSAIDs, medicines for pain and inflammation, like ibuprofen or naproxen -some diuretics like bumetanide, furosemide -teriparatide -thalidomide This list may not describe all possible interactions. Give your health care provider a list of all the medicines, herbs, non-prescription drugs, or  dietary supplements you use. Also tell them if you smoke, drink alcohol, or use illegal drugs. Some items may interact with your medicine. What should I watch for while using this medicine? Visit your doctor or health care professional for regular checkups. It may be some time before you see the benefit from this medicine. Do not stop taking your medicine unless your doctor tells you to. Your doctor may order blood tests or other tests to see how you are doing. Women should inform their doctor if they wish to become pregnant or think they might be pregnant. There is a potential for serious side effects to an unborn child. Talk to your health care professional or pharmacist for more information. You should make sure that you get enough calcium and vitamin D while you are taking this medicine. Discuss the foods you eat and the vitamins you take with your health care professional. Some people who take this medicine have severe bone, joint, and/or muscle pain. This medicine may also increase your risk for jaw problems or a broken thigh bone. Tell your doctor right away if you have severe pain in your jaw, bones, joints, or muscles. Tell your doctor if you have any pain that does not go away or that gets worse. Tell your dentist and dental surgeon that you are taking this medicine. You should not have major dental surgery while on this medicine. See your dentist to have a dental exam and fix any dental problems before starting this medicine. Take good care of your teeth while on this medicine. Make sure you see your dentist for regular follow-up appointments. What side effects may I notice from receiving this medicine? Side effects that   you should report to your doctor or health care professional as soon as possible: -allergic reactions like skin rash, itching or hives, swelling of the face, lips, or tongue -anxiety, confusion, or depression -breathing problems -changes in vision -eye pain -feeling faint or  lightheaded, falls -jaw pain, especially after dental work -mouth sores -muscle cramps, stiffness, or weakness -trouble passing urine or change in the amount of urine Side effects that usually do not require medical attention (report to your doctor or health care professional if they continue or are bothersome): -bone, joint, or muscle pain -constipation -diarrhea -fever -hair loss -irritation at site where injected -loss of appetite -nausea, vomiting -stomach upset -trouble sleeping -trouble swallowing -weak or tired This list may not describe all possible side effects. Call your doctor for medical advice about side effects. You may report side effects to FDA at 1-800-FDA-1088. Where should I keep my medicine? This drug is given in a hospital or clinic and will not be stored at home. NOTE: This sheet is a summary. It may not cover all possible information. If you have questions about this medicine, talk to your doctor, pharmacist, or health care provider.  2015, Elsevier/Gold Standard. (2012-08-10 13:03:13)  Bortezomib injection What is this medicine? BORTEZOMIB (bor TEZ oh mib) is a chemotherapy drug. It slows the growth of cancer cells. This medicine is used to treat multiple myeloma, and certain lymphomas, such as mantle-cell lymphoma. This medicine may be used for other purposes; ask your health care provider or pharmacist if you have questions. COMMON BRAND NAME(S): Velcade What should I tell my health care provider before I take this medicine? They need to know if you have any of these conditions: -diabetes -heart disease -irregular heartbeat -liver disease -on hemodialysis -low blood counts, like low white blood cells, platelets, or hemoglobin -peripheral neuropathy -taking medicine for blood pressure -an unusual or allergic reaction to bortezomib, mannitol, boron, other medicines, foods, dyes, or preservatives -pregnant or trying to get pregnant -breast-feeding How  should I use this medicine? This medicine is for injection into a vein or for injection under the skin. It is given by a health care professional in a hospital or clinic setting. Talk to your pediatrician regarding the use of this medicine in children. Special care may be needed. Overdosage: If you think you have taken too much of this medicine contact a poison control center or emergency room at once. NOTE: This medicine is only for you. Do not share this medicine with others. What if I miss a dose? It is important not to miss your dose. Call your doctor or health care professional if you are unable to keep an appointment. What may interact with this medicine? This medicine may interact with the following medications: -ketoconazole -rifampin -ritonavir -St. John's Wort This list may not describe all possible interactions. Give your health care provider a list of all the medicines, herbs, non-prescription drugs, or dietary supplements you use. Also tell them if you smoke, drink alcohol, or use illegal drugs. Some items may interact with your medicine. What should I watch for while using this medicine? Visit your doctor for checks on your progress. This drug may make you feel generally unwell. This is not uncommon, as chemotherapy can affect healthy cells as well as cancer cells. Report any side effects. Continue your course of treatment even though you feel ill unless your doctor tells you to stop. You may get drowsy or dizzy. Do not drive, use machinery, or do anything that needs mental   until you know how this medicine affects you. Do not stand or sit up quickly, especially if you are an older patient. This reduces the risk of dizzy or fainting spells. In some cases, you may be given additional medicines to help with side effects. Follow all directions for their use. Call your doctor or health care professional for advice if you get a fever, chills or sore throat, or other symptoms of a  cold or flu. Do not treat yourself. This drug decreases your body's ability to fight infections. Try to avoid being around people who are sick. This medicine may increase your risk to bruise or bleed. Call your doctor or health care professional if you notice any unusual bleeding. You may need blood work done while you are taking this medicine. In some patients, this medicine may cause a serious brain infection that may cause death. If you have any problems seeing, thinking, speaking, walking, or standing, tell your doctor right away. If you cannot reach your doctor, urgently seek other source of medical care. Do not become pregnant while taking this medicine. Women should inform their doctor if they wish to become pregnant or think they might be pregnant. There is a potential for serious side effects to an unborn child. Talk to your health care professional or pharmacist for more information. Do not breast-feed an infant while taking this medicine. Check with your doctor or health care professional if you get an attack of severe diarrhea, nausea and vomiting, or if you sweat a lot. The loss of too much body fluid can make it dangerous for you to take this medicine. What side effects may I notice from receiving this medicine? Side effects that you should report to your doctor or health care professional as soon as possible: -allergic reactions like skin rash, itching or hives, swelling of the face, lips, or tongue -breathing problems -changes in hearing -changes in vision -fast, irregular heartbeat -feeling faint or lightheaded, falls -pain, tingling, numbness in the hands or feet -right upper belly pain -seizures -swelling of the ankles, feet, hands -unusual bleeding or bruising -unusually weak or tired -vomiting -yellowing of the eyes or skin Side effects that usually do not require medical attention (report to your doctor or health care professional if they continue or are  bothersome): -changes in emotions or moods -constipation -diarrhea -loss of appetite -headache -irritation at site where injected -nausea This list may not describe all possible side effects. Call your doctor for medical advice about side effects. You may report side effects to FDA at 1-800-FDA-1088. Where should I keep my medicine? This drug is given in a hospital or clinic and will not be stored at home. NOTE: This sheet is a summary. It may not cover all possible information. If you have questions about this medicine, talk to your doctor, pharmacist, or health care provider.  2015, Elsevier/Gold Standard. (2012-12-25 12:46:32)

## 2014-02-21 LAB — IGG, IGA, IGM
IgA: 659 mg/dL — ABNORMAL HIGH (ref 68–379)
IgG (Immunoglobin G), Serum: 427 mg/dL — ABNORMAL LOW (ref 650–1600)
IgM, Serum: 9 mg/dL — ABNORMAL LOW (ref 41–251)

## 2014-02-21 LAB — KAPPA/LAMBDA LIGHT CHAINS
Kappa free light chain: 2.99 mg/dL — ABNORMAL HIGH (ref 0.33–1.94)
Kappa:Lambda Ratio: 5.16 — ABNORMAL HIGH (ref 0.26–1.65)
Lambda Free Lght Chn: 0.58 mg/dL (ref 0.57–2.63)

## 2014-02-22 ENCOUNTER — Other Ambulatory Visit: Payer: Self-pay | Admitting: *Deleted

## 2014-02-22 MED ORDER — ONDANSETRON HCL 8 MG PO TABS: 4.0000 mg | ORAL_TABLET | Freq: Three times a day (TID) | ORAL | Status: DC | PRN

## 2014-02-26 ENCOUNTER — Ambulatory Visit: Payer: Medicare Other

## 2014-02-27 ENCOUNTER — Ambulatory Visit: Payer: Medicare Other

## 2014-02-28 ENCOUNTER — Other Ambulatory Visit: Payer: Self-pay | Admitting: *Deleted

## 2014-02-28 ENCOUNTER — Other Ambulatory Visit: Payer: Self-pay | Admitting: Oncology

## 2014-02-28 ENCOUNTER — Ambulatory Visit (HOSPITAL_BASED_OUTPATIENT_CLINIC_OR_DEPARTMENT_OTHER): Payer: Medicare Other

## 2014-02-28 ENCOUNTER — Other Ambulatory Visit: Payer: Self-pay | Admitting: Nurse Practitioner

## 2014-02-28 DIAGNOSIS — C9000 Multiple myeloma not having achieved remission: Secondary | ICD-10-CM | POA: Diagnosis not present

## 2014-02-28 DIAGNOSIS — E859 Amyloidosis, unspecified: Secondary | ICD-10-CM

## 2014-02-28 DIAGNOSIS — Z5112 Encounter for antineoplastic immunotherapy: Secondary | ICD-10-CM | POA: Diagnosis not present

## 2014-02-28 MED ORDER — CYCLOPHOSPHAMIDE 50 MG PO TABS: ORAL_TABLET | ORAL | Status: DC

## 2014-02-28 MED ORDER — BORTEZOMIB CHEMO SQ INJECTION 3.5 MG (2.5MG/ML)
1.5000 mg/m2 | Freq: Once | INTRAMUSCULAR | Status: AC
  Administered 2014-02-28: 2.75 mg via SUBCUTANEOUS
  Filled 2014-02-28: qty 2.75

## 2014-02-28 MED ORDER — ONDANSETRON HCL 8 MG PO TABS
ORAL_TABLET | ORAL | Status: AC
  Filled 2014-02-28: qty 1

## 2014-02-28 MED ORDER — ONDANSETRON HCL 8 MG PO TABS
8.0000 mg | ORAL_TABLET | Freq: Once | ORAL | Status: AC
  Administered 2014-02-28: 8 mg via ORAL

## 2014-02-28 NOTE — Addendum Note (Signed)
Addended by: Jimmy Footman on: 02/28/2014 11:11 AM   Modules accepted: Orders

## 2014-03-06 ENCOUNTER — Ambulatory Visit (HOSPITAL_BASED_OUTPATIENT_CLINIC_OR_DEPARTMENT_OTHER): Payer: Medicare Other

## 2014-03-06 DIAGNOSIS — E859 Amyloidosis, unspecified: Secondary | ICD-10-CM | POA: Diagnosis not present

## 2014-03-06 DIAGNOSIS — Z5112 Encounter for antineoplastic immunotherapy: Secondary | ICD-10-CM

## 2014-03-06 MED ORDER — ONDANSETRON HCL 8 MG PO TABS
ORAL_TABLET | ORAL | Status: AC
  Filled 2014-03-06: qty 1

## 2014-03-06 MED ORDER — BORTEZOMIB CHEMO SQ INJECTION 3.5 MG (2.5MG/ML)
1.5000 mg/m2 | Freq: Once | INTRAMUSCULAR | Status: AC
  Administered 2014-03-06: 2.75 mg via SUBCUTANEOUS
  Filled 2014-03-06: qty 2.75

## 2014-03-06 MED ORDER — ONDANSETRON HCL 8 MG PO TABS
8.0000 mg | ORAL_TABLET | Freq: Once | ORAL | Status: AC
  Administered 2014-03-06: 8 mg via ORAL

## 2014-03-06 NOTE — Progress Notes (Signed)
Per Dr. Marin Olp no labs needed for Velcade today.

## 2014-03-06 NOTE — Patient Instructions (Signed)
Bortezomib injection What is this medicine? BORTEZOMIB (bor TEZ oh mib) is a chemotherapy drug. It slows the growth of cancer cells. This medicine is used to treat multiple myeloma, and certain lymphomas, such as mantle-cell lymphoma. This medicine may be used for other purposes; ask your health care provider or pharmacist if you have questions. COMMON BRAND NAME(S): Velcade What should I tell my health care provider before I take this medicine? They need to know if you have any of these conditions: -diabetes -heart disease -irregular heartbeat -liver disease -on hemodialysis -low blood counts, like low white blood cells, platelets, or hemoglobin -peripheral neuropathy -taking medicine for blood pressure -an unusual or allergic reaction to bortezomib, mannitol, boron, other medicines, foods, dyes, or preservatives -pregnant or trying to get pregnant -breast-feeding How should I use this medicine? This medicine is for injection into a vein or for injection under the skin. It is given by a health care professional in a hospital or clinic setting. Talk to your pediatrician regarding the use of this medicine in children. Special care may be needed. Overdosage: If you think you have taken too much of this medicine contact a poison control center or emergency room at once. NOTE: This medicine is only for you. Do not share this medicine with others. What if I miss a dose? It is important not to miss your dose. Call your doctor or health care professional if you are unable to keep an appointment. What may interact with this medicine? This medicine may interact with the following medications: -ketoconazole -rifampin -ritonavir -St. John's Wort This list may not describe all possible interactions. Give your health care provider a list of all the medicines, herbs, non-prescription drugs, or dietary supplements you use. Also tell them if you smoke, drink alcohol, or use illegal drugs. Some items  may interact with your medicine. What should I watch for while using this medicine? Visit your doctor for checks on your progress. This drug may make you feel generally unwell. This is not uncommon, as chemotherapy can affect healthy cells as well as cancer cells. Report any side effects. Continue your course of treatment even though you feel ill unless your doctor tells you to stop. You may get drowsy or dizzy. Do not drive, use machinery, or do anything that needs mental alertness until you know how this medicine affects you. Do not stand or sit up quickly, especially if you are an older patient. This reduces the risk of dizzy or fainting spells. In some cases, you may be given additional medicines to help with side effects. Follow all directions for their use. Call your doctor or health care professional for advice if you get a fever, chills or sore throat, or other symptoms of a cold or flu. Do not treat yourself. This drug decreases your body's ability to fight infections. Try to avoid being around people who are sick. This medicine may increase your risk to bruise or bleed. Call your doctor or health care professional if you notice any unusual bleeding. You may need blood work done while you are taking this medicine. In some patients, this medicine may cause a serious brain infection that may cause death. If you have any problems seeing, thinking, speaking, walking, or standing, tell your doctor right away. If you cannot reach your doctor, urgently seek other source of medical care. Do not become pregnant while taking this medicine. Women should inform their doctor if they wish to become pregnant or think they might be pregnant. There is   a potential for serious side effects to an unborn child. Talk to your health care professional or pharmacist for more information. Do not breast-feed an infant while taking this medicine. Check with your doctor or health care professional if you get an attack of  severe diarrhea, nausea and vomiting, or if you sweat a lot. The loss of too much body fluid can make it dangerous for you to take this medicine. What side effects may I notice from receiving this medicine? Side effects that you should report to your doctor or health care professional as soon as possible: -allergic reactions like skin rash, itching or hives, swelling of the face, lips, or tongue -breathing problems -changes in hearing -changes in vision -fast, irregular heartbeat -feeling faint or lightheaded, falls -pain, tingling, numbness in the hands or feet -right upper belly pain -seizures -swelling of the ankles, feet, hands -unusual bleeding or bruising -unusually weak or tired -vomiting -yellowing of the eyes or skin Side effects that usually do not require medical attention (report to your doctor or health care professional if they continue or are bothersome): -changes in emotions or moods -constipation -diarrhea -loss of appetite -headache -irritation at site where injected -nausea This list may not describe all possible side effects. Call your doctor for medical advice about side effects. You may report side effects to FDA at 1-800-FDA-1088. Where should I keep my medicine? This drug is given in a hospital or clinic and will not be stored at home. NOTE: This sheet is a summary. It may not cover all possible information. If you have questions about this medicine, talk to your doctor, pharmacist, or health care provider.  2015, [employer] Standard. (2012-12-25 12:46:32)  

## 2014-03-20 DIAGNOSIS — N401 Enlarged prostate with lower urinary tract symptoms: Secondary | ICD-10-CM | POA: Diagnosis not present

## 2014-03-20 DIAGNOSIS — E291 Testicular hypofunction: Secondary | ICD-10-CM | POA: Diagnosis not present

## 2014-03-21 ENCOUNTER — Ambulatory Visit (INDEPENDENT_AMBULATORY_CARE_PROVIDER_SITE_OTHER): Payer: Medicare Other | Admitting: Podiatry

## 2014-03-21 ENCOUNTER — Encounter: Payer: Self-pay | Admitting: Podiatry

## 2014-03-21 ENCOUNTER — Telehealth: Payer: Self-pay | Admitting: Hematology & Oncology

## 2014-03-21 VITALS — BP 72/44 | HR 58 | Resp 11

## 2014-03-21 DIAGNOSIS — M76829 Posterior tibial tendinitis, unspecified leg: Secondary | ICD-10-CM

## 2014-03-21 DIAGNOSIS — S96811A Strain of other specified muscles and tendons at ankle and foot level, right foot, initial encounter: Secondary | ICD-10-CM | POA: Diagnosis not present

## 2014-03-21 DIAGNOSIS — S86111A Strain of other muscle(s) and tendon(s) of posterior muscle group at lower leg level, right leg, initial encounter: Secondary | ICD-10-CM

## 2014-03-21 DIAGNOSIS — B351 Tinea unguium: Secondary | ICD-10-CM | POA: Diagnosis not present

## 2014-03-21 DIAGNOSIS — M6789 Other specified disorders of synovium and tendon, multiple sites: Secondary | ICD-10-CM

## 2014-03-21 NOTE — Telephone Encounter (Signed)
Pt moved 1-13 to 1-12 is aware time between lab abs MD

## 2014-03-21 NOTE — Progress Notes (Signed)
   Subjective:    Patient ID: William Barron, male    DOB: 1946-02-03, 69 y.o.   MRN: [id number]  HPI    Review of Systems  Constitutional:       Diagnosed with multiple meloma  All other systems reviewed and are negative.      Objective:   Physical Exam        Assessment & Plan:

## 2014-03-22 NOTE — Progress Notes (Signed)
Subjective:     Patient ID: KAIREN HALLINAN, male   DOB: Apr 06, 1945, 69 y.o.   MRN: [id number]  HPI patient states my left big toenail has become very dark and thick and I have complete collapse of my right ankle and I'm having trouble doing any form of athletic activity associated with it   Review of Systems     Objective:   Physical Exam Neurovascular status intact with thick yellow brittle nailbeds left big toe that's not painful currently and complete collapse medial longitudinal arch right with what appears to be dysfunction or probable complete tear of the posterior tibial tendon    Assessment:     Complete collapse medial longitudinal arch right with symptoms secondary to probable dysfunction and possible tear posterior tibial tendon and damaged mycotic toenail left    Plan:     Reviewed both conditions and at this time I've recommended an [place] brace for the right in order to support during the gait cycle and try to prevent further damage with consideration some day for fusion depending on how it responds to bracing. Do not recommend currently treatment for left big toe and at this time the patient is casted for an AFO type device for the right

## 2014-03-26 ENCOUNTER — Encounter: Payer: Self-pay | Admitting: Hematology & Oncology

## 2014-03-26 ENCOUNTER — Ambulatory Visit (HOSPITAL_BASED_OUTPATIENT_CLINIC_OR_DEPARTMENT_OTHER): Payer: Medicare Other | Admitting: Hematology & Oncology

## 2014-03-26 ENCOUNTER — Ambulatory Visit (HOSPITAL_BASED_OUTPATIENT_CLINIC_OR_DEPARTMENT_OTHER): Payer: Medicare Other | Admitting: Lab

## 2014-03-26 ENCOUNTER — Ambulatory Visit (HOSPITAL_BASED_OUTPATIENT_CLINIC_OR_DEPARTMENT_OTHER): Payer: Medicare Other

## 2014-03-26 VITALS — BP 139/71 | HR 79 | Temp 98.1°F | Resp 17 | Ht 66.0 in | Wt 162.0 lb

## 2014-03-26 DIAGNOSIS — E859 Amyloidosis, unspecified: Secondary | ICD-10-CM

## 2014-03-26 DIAGNOSIS — C9000 Multiple myeloma not having achieved remission: Secondary | ICD-10-CM

## 2014-03-26 DIAGNOSIS — Z5112 Encounter for antineoplastic immunotherapy: Secondary | ICD-10-CM | POA: Diagnosis present

## 2014-03-26 LAB — CMP (CANCER CENTER ONLY)
ALT(SGPT): 14 U/L (ref 10–47)
AST: 16 U/L (ref 11–38)
Albumin: 3.5 g/dL (ref 3.3–5.5)
Alkaline Phosphatase: 78 U/L (ref 26–84)
BUN, Bld: 21 mg/dL (ref 7–22)
CO2: 25 mEq/L (ref 18–33)
Calcium: 9.1 mg/dL (ref 8.0–10.3)
Chloride: 105 mEq/L (ref 98–108)
Creat: 1 mg/dl (ref 0.6–1.2)
Glucose, Bld: 118 mg/dL (ref 73–118)
Potassium: 3.7 mEq/L (ref 3.3–4.7)
Sodium: 142 mEq/L (ref 128–145)
Total Bilirubin: 0.6 mg/dl (ref 0.20–1.60)
Total Protein: 7.1 g/dL (ref 6.4–8.1)

## 2014-03-26 LAB — CBC WITH DIFFERENTIAL (CANCER CENTER ONLY)
BASO#: 0 10*3/uL (ref 0.0–0.2)
BASO%: 0.5 % (ref 0.0–2.0)
EOS%: 2.8 % (ref 0.0–7.0)
Eosinophils Absolute: 0.2 10*3/uL (ref 0.0–0.5)
HCT: 41.6 % (ref 38.7–49.9)
HGB: 14.2 g/dL (ref 13.0–17.1)
LYMPH#: 1 10*3/uL (ref 0.9–3.3)
LYMPH%: 12.8 % — ABNORMAL LOW (ref 14.0–48.0)
MCH: 33.8 pg — ABNORMAL HIGH (ref 28.0–33.4)
MCHC: 34.1 g/dL (ref 32.0–35.9)
MCV: 99 fL — ABNORMAL HIGH (ref 82–98)
MONO#: 0.5 10*3/uL (ref 0.1–0.9)
MONO%: 6.1 % (ref 0.0–13.0)
NEUT#: 5.9 10*3/uL (ref 1.5–6.5)
NEUT%: 77.8 % (ref 40.0–80.0)
Platelets: 241 10*3/uL (ref 145–400)
RBC: 4.2 10*6/uL (ref 4.20–5.70)
RDW: 13.6 % (ref 11.1–15.7)
WBC: 7.6 10*3/uL (ref 4.0–10.0)

## 2014-03-26 MED ORDER — ONDANSETRON HCL 8 MG PO TABS
ORAL_TABLET | ORAL | Status: AC
  Filled 2014-03-26: qty 1

## 2014-03-26 MED ORDER — BORTEZOMIB CHEMO SQ INJECTION 3.5 MG (2.5MG/ML)
1.5000 mg/m2 | Freq: Once | INTRAMUSCULAR | Status: AC
  Administered 2014-03-26: 2.75 mg via SUBCUTANEOUS
  Filled 2014-03-26: qty 2.75

## 2014-03-26 MED ORDER — SODIUM CHLORIDE 0.9 % IV SOLN
Freq: Once | INTRAVENOUS | Status: AC
  Administered 2014-03-26: 10:00:00 via INTRAVENOUS

## 2014-03-26 MED ORDER — ZOLEDRONIC ACID 4 MG/100ML IV SOLN
4.0000 mg | Freq: Once | INTRAVENOUS | Status: AC
  Administered 2014-03-26: 4 mg via INTRAVENOUS
  Filled 2014-03-26: qty 100

## 2014-03-26 MED ORDER — ONDANSETRON HCL 8 MG PO TABS
8.0000 mg | ORAL_TABLET | Freq: Once | ORAL | Status: AC
  Administered 2014-03-26: 8 mg via ORAL

## 2014-03-26 NOTE — Progress Notes (Signed)
Hematology and Oncology Follow Up Visit  William Barron [id number] 02/15/1946 69 y.o. 03/26/2014   Principle Diagnosis:   IgA Kappa myeloma with amyloidosis  Current Therapy:    Weekly Velcade with oral Cytoxan/Decadron     Interim History:  William Barron is in for followup. He actually is going up to the [facility] in early February for a second opinion regarding transplant. He was seen by the transplant doctors at [facility]. They're not sure that he would benefit from a transplant. His appointment at [facility] will be February 2.  He had no problems over the holidays. He was down in [place]. He ate well. He had no positive pain. There is no cough. He had no fever.  He is tolerating treatment well. He is still working. He's had no nausea or vomiting. Decreasing the Decadron dose has helped his overall personality. He is not as agitated. He really has had no problems with side effects. He's not lost his hair. He's had a little nausea or vomiting.    He's had no problems with pain. There is no cough. He feels . He has had no shortness of breath. There is no diarrhea. He has had no rashes. He's had no headache. There is no visual issues.  His last myeloma studies in December showed kappa light chain at 2.99 mg/dL. Previously, it was 3.96 mg/dL. his IgA level was 659 mg/dL. these are all slightly improved.  His performance status is ECOG 1.  Medications: Current outpatient prescriptions: aspirin EC 81 MG tablet, Take by mouth., Disp: , Rfl: ;  cholecalciferol (VITAMIN D) 1000 UNITS tablet, Take 1,000 Units by mouth daily. Takes 2 in am, Disp: , Rfl: ;  cyclophosphamide (CYTOXAN) 50 MG tablet, Take 11 tablets weekly on an empty stomach 1 hour before or 2 hours after meals. Amyloidosis E85.9, Disp: 50 tablet, Rfl: 2 dexamethasone (DECADRON) 4 MG tablet, Take 5 tablets weekly with Chemotherapy., Disp: 100 tablet, Rfl: 2;  famciclovir (FAMVIR) 500 MG tablet, Take 1 tablet (500 mg total)  by mouth 3 (three) times daily., Disp: 30 tablet, Rfl: 5;  fluconazole (DIFLUCAN) 200 MG tablet, Take 200 mg by mouth every 7 (seven) days., Disp: , Rfl: ;  minoxidil (ROGAINE) 2 % external solution, Apply 1 application topically daily. , Disp: , Rfl:  ondansetron (ZOFRAN) 8 MG tablet, Take 0.5 tablets (4 mg total) by mouth every 8 (eight) hours as needed for nausea or vomiting., Disp: 30 tablet, Rfl: 0;  Tamsulosin HCl (FLOMAX) 0.4 MG CAPS, Take 0.4 mg by mouth 2 (two) times daily. , Disp: , Rfl: ;  Testosterone (ANDROGEL PUMP TD), Place 3 sprays onto the skin daily. daily, Disp: , Rfl:  zolpidem (AMBIEN) 5 MG tablet, Take 0.5-1 tablets (2.5-5 mg total) by mouth at bedtime as needed for sleep., Disp: 30 tablet, Rfl: 2;  clorazepate (TRANXENE) 3.75 MG tablet, Take 1 tablet, IF NEEDED, every 6 hours for agitation. (Patient not taking: Reported on 03/26/2014), Disp: 60 tablet, Rfl: 0 prochlorperazine (COMPAZINE) 10 MG tablet, Take 1 tablet (10 mg total) by mouth every 6 (six) hours as needed for nausea or vomiting. (Patient not taking: Reported on 03/26/2014), Disp: 30 tablet, Rfl: 3  Allergies:  Allergies  Allergen Reactions  . Penicillins Swelling and Rash    Past Medical History, Surgical history, Social history, and Family History were reviewed and updated.  Review of Systems: As above  Physical Exam:  height is 5\' 6"  (1.676 m) and weight is 162  lb (73.483 kg). His oral temperature is 98.1 F (36.7 C). His blood pressure is 139/71 and his pulse is 79. His respiration is 17.   Well-developed well-nourished white gentleman. Head and neck exam shows no ocular or oral lesions. Has no palpable cervical or supraclavicular lymph nodes. Lungs are clear. Cardiac exam regular rate and rhythm with no murmurs rubs or bruits. Abdomen soft. Good bowel sounds. There is no fluid wave. Is no palpable liver or spleen tip. Extremities shows no clubbing, cyanosis or edema. Has good strength. Has good range of motion  of his joints. Skin exam no rashes, ecchymosis or petechia. Neurological exam shows no focal neurological deficits.  Lab Results  Component Value Date   WBC 7.6 03/26/2014   HGB 14.2 03/26/2014   HCT 41.6 03/26/2014   MCV 99* 03/26/2014   PLT 241 03/26/2014     Chemistry      Component Value Date/Time   NA 142 03/26/2014 0823   NA 138 11/20/2013 1159   NA 141 04/17/2013 1044   K 3.7 03/26/2014 0823   K 3.7 11/20/2013 1159   K 4.1 04/17/2013 1044   CL 105 03/26/2014 0823   CL 106 11/20/2013 1159   CO2 25 03/26/2014 0823   CO2 23 11/20/2013 1159   CO2 28 04/17/2013 1044   BUN 21 03/26/2014 0823   BUN 22 11/20/2013 1159   BUN 17.3 04/17/2013 1044   CREATININE 1.0 03/26/2014 0823   CREATININE 0.99 11/20/2013 1159   CREATININE 1.1 04/17/2013 1044      Component Value Date/Time   CALCIUM 9.1 03/26/2014 0823   CALCIUM 9.5 11/20/2013 1159   CALCIUM 9.8 04/17/2013 1044   ALKPHOS 78 03/26/2014 0823   ALKPHOS 74 11/20/2013 1159   ALKPHOS 89 04/17/2013 1044   AST 16 03/26/2014 0823   AST 13 11/20/2013 1159   AST 16 04/17/2013 1044   ALT 14 03/26/2014 0823   ALT 12 11/20/2013 1159   ALT 17 04/17/2013 1044   BILITOT 0.60 03/26/2014 0823   BILITOT 0.5 11/20/2013 1159   BILITOT 0.45 04/17/2013 1044        Impression and Plan: William Barron is 69 year old gentleman with IgA myeloma with amyloidosis.   We will continue him on therapy. The question is whether or not he will get a transplant.  I do think that he probably needs to have a 24-hour urine done. I will see about ordering this.  I will plan to see him back after he gets back from the [facility].  We will monitor his blood work weekly with his chemotherapy.     Volanda Napoleon, MD 1/12/20169:48 AM

## 2014-03-26 NOTE — Patient Instructions (Signed)

## 2014-03-27 ENCOUNTER — Other Ambulatory Visit: Payer: Medicare Other | Admitting: Lab

## 2014-03-27 ENCOUNTER — Ambulatory Visit: Payer: Medicare Other | Admitting: Hematology & Oncology

## 2014-03-27 ENCOUNTER — Ambulatory Visit: Payer: Medicare Other

## 2014-03-27 ENCOUNTER — Telehealth: Payer: Self-pay | Admitting: *Deleted

## 2014-03-27 DIAGNOSIS — N5201 Erectile dysfunction due to arterial insufficiency: Secondary | ICD-10-CM | POA: Diagnosis not present

## 2014-03-27 DIAGNOSIS — N401 Enlarged prostate with lower urinary tract symptoms: Secondary | ICD-10-CM | POA: Diagnosis not present

## 2014-03-27 DIAGNOSIS — E291 Testicular hypofunction: Secondary | ICD-10-CM | POA: Diagnosis not present

## 2014-03-27 DIAGNOSIS — R351 Nocturia: Secondary | ICD-10-CM | POA: Diagnosis not present

## 2014-03-27 NOTE — Telephone Encounter (Signed)
Patient called stating he had shaking chills last pm. Didn't take his temperature. Today at his routine appointment with his urologist his temperature was 102. Dr Jeffie Pollock wanted [place] to make sure Dr Marin Olp was aware. Spoke to Dr Marin Olp who believes this may be a reaction to his Zometa infusion yesterday. Spoke with patient. Told him that at this time we would not need to do anything, but that if he started having symptoms, or if his fever continued, to call the office back. He is agreeable to plan.

## 2014-03-29 ENCOUNTER — Other Ambulatory Visit: Payer: Self-pay | Admitting: Nurse Practitioner

## 2014-03-29 LAB — PROTEIN ELECTROPHORESIS, SERUM, WITH REFLEX
Albumin ELP: 58.7 % (ref 55.8–66.1)
Alpha-1-Globulin: 4 % (ref 2.9–4.9)
Alpha-2-Globulin: 10.2 % (ref 7.1–11.8)
Beta 2: 4.9 % (ref 3.2–6.5)
Beta Globulin: 5.7 % (ref 4.7–7.2)
Gamma Globulin: 16.5 % (ref 11.1–18.8)
M-Spike, %: 0.7 g/dL
Total Protein, Serum Electrophoresis: 6.8 g/dL (ref 6.0–8.3)

## 2014-03-29 LAB — KAPPA/LAMBDA LIGHT CHAINS
Kappa free light chain: 3.41 mg/dL — ABNORMAL HIGH (ref 0.33–1.94)
Kappa:Lambda Ratio: 3.92 — ABNORMAL HIGH (ref 0.26–1.65)
Lambda Free Lght Chn: 0.87 mg/dL (ref 0.57–2.63)

## 2014-03-29 LAB — IGG, IGA, IGM
IgA: 698 mg/dL — ABNORMAL HIGH (ref 68–379)
IgG (Immunoglobin G), Serum: 385 mg/dL — ABNORMAL LOW (ref 650–1600)
IgM, Serum: 9 mg/dL — ABNORMAL LOW (ref 41–251)

## 2014-03-29 LAB — IFE INTERPRETATION

## 2014-03-29 MED ORDER — AZITHROMYCIN 250 MG PO TABS: ORAL_TABLET | ORAL | Status: DC

## 2014-03-30 ENCOUNTER — Emergency Department (HOSPITAL_BASED_OUTPATIENT_CLINIC_OR_DEPARTMENT_OTHER): Payer: Medicare Other

## 2014-03-30 ENCOUNTER — Encounter (HOSPITAL_BASED_OUTPATIENT_CLINIC_OR_DEPARTMENT_OTHER): Payer: Self-pay | Admitting: *Deleted

## 2014-03-30 ENCOUNTER — Emergency Department (HOSPITAL_BASED_OUTPATIENT_CLINIC_OR_DEPARTMENT_OTHER)
Admission: EM | Admit: 2014-03-30 | Discharge: 2014-03-30 | Disposition: A | Discharge status: Home / Self Care | Payer: Medicare Other | Attending: Emergency Medicine | Admitting: Emergency Medicine

## 2014-03-30 DIAGNOSIS — Z7982 Long term (current) use of aspirin: Secondary | ICD-10-CM | POA: Insufficient documentation

## 2014-03-30 DIAGNOSIS — Z7952 Long term (current) use of systemic steroids: Secondary | ICD-10-CM | POA: Diagnosis not present

## 2014-03-30 DIAGNOSIS — Z79899 Other long term (current) drug therapy: Secondary | ICD-10-CM | POA: Diagnosis not present

## 2014-03-30 DIAGNOSIS — R05 Cough: Secondary | ICD-10-CM | POA: Diagnosis not present

## 2014-03-30 DIAGNOSIS — Z8639 Personal history of other endocrine, nutritional and metabolic disease: Secondary | ICD-10-CM | POA: Insufficient documentation

## 2014-03-30 DIAGNOSIS — J029 Acute pharyngitis, unspecified: Secondary | ICD-10-CM | POA: Diagnosis present

## 2014-03-30 DIAGNOSIS — R059 Cough, unspecified: Secondary | ICD-10-CM

## 2014-03-30 DIAGNOSIS — J069 Acute upper respiratory infection, unspecified: Secondary | ICD-10-CM | POA: Insufficient documentation

## 2014-03-30 DIAGNOSIS — Z87891 Personal history of nicotine dependence: Secondary | ICD-10-CM | POA: Insufficient documentation

## 2014-03-30 DIAGNOSIS — Z880 Allergy status to penicillin: Secondary | ICD-10-CM | POA: Diagnosis not present

## 2014-03-30 DIAGNOSIS — Z87442 Personal history of urinary calculi: Secondary | ICD-10-CM | POA: Insufficient documentation

## 2014-03-30 DIAGNOSIS — J984 Other disorders of lung: Secondary | ICD-10-CM | POA: Diagnosis not present

## 2014-03-30 DIAGNOSIS — Z87438 Personal history of other diseases of male genital organs: Secondary | ICD-10-CM | POA: Insufficient documentation

## 2014-03-30 DIAGNOSIS — I517 Cardiomegaly: Secondary | ICD-10-CM | POA: Diagnosis not present

## 2014-03-30 LAB — CBC WITH DIFFERENTIAL/PLATELET
Basophils Absolute: 0 10*3/uL (ref 0.0–0.1)
Basophils Relative: 0 % (ref 0–1)
Eosinophils Absolute: 0 10*3/uL (ref 0.0–0.7)
Eosinophils Relative: 0 % (ref 0–5)
HCT: 38.6 % — ABNORMAL LOW (ref 39.0–52.0)
Hemoglobin: 12.7 g/dL — ABNORMAL LOW (ref 13.0–17.0)
Lymphocytes Relative: 5 % — ABNORMAL LOW (ref 12–46)
Lymphs Abs: 0.6 10*3/uL — ABNORMAL LOW (ref 0.7–4.0)
MCH: 33 pg (ref 26.0–34.0)
MCHC: 32.9 g/dL (ref 30.0–36.0)
MCV: 100.3 fL — ABNORMAL HIGH (ref 78.0–100.0)
Monocytes Absolute: 0.8 10*3/uL (ref 0.1–1.0)
Monocytes Relative: 7 % (ref 3–12)
Neutro Abs: 10.3 10*3/uL — ABNORMAL HIGH (ref 1.7–7.7)
Neutrophils Relative %: 88 % — ABNORMAL HIGH (ref 43–77)
Platelets: 232 10*3/uL (ref 150–400)
RBC: 3.85 MIL/uL — ABNORMAL LOW (ref 4.22–5.81)
RDW: 13.9 % (ref 11.5–15.5)
WBC: 11.7 10*3/uL — ABNORMAL HIGH (ref 4.0–10.5)

## 2014-03-30 LAB — URINE MICROSCOPIC-ADD ON

## 2014-03-30 LAB — BASIC METABOLIC PANEL
Anion gap: 9 (ref 5–15)
BUN: 18 mg/dL (ref 6–23)
CO2: 25 mmol/L (ref 19–32)
Calcium: 9.2 mg/dL (ref 8.4–10.5)
Chloride: 99 mEq/L (ref 96–112)
Creatinine, Ser: 1.3 mg/dL (ref 0.50–1.35)
GFR calc Af Amer: 63 mL/min — ABNORMAL LOW (ref >90)
GFR calc non Af Amer: 54 mL/min — ABNORMAL LOW (ref >90)
Glucose, Bld: 134 mg/dL — ABNORMAL HIGH (ref 70–99)
Potassium: 3.7 mmol/L (ref 3.5–5.1)
Sodium: 133 mmol/L — ABNORMAL LOW (ref 135–145)

## 2014-03-30 LAB — URINALYSIS, ROUTINE W REFLEX MICROSCOPIC
Bilirubin Urine: NEGATIVE
Glucose, UA: NEGATIVE mg/dL
Ketones, ur: NEGATIVE mg/dL
Leukocytes, UA: NEGATIVE
Nitrite: NEGATIVE
Protein, ur: 100 mg/dL — AB
Specific Gravity, Urine: 1.02 (ref 1.005–1.030)
Urobilinogen, UA: 1 mg/dL (ref 0.0–1.0)
pH: 6 (ref 5.0–8.0)

## 2014-03-30 LAB — INFLUENZA PANEL BY PCR (TYPE A & B)
H1N1 flu by pcr: NOT DETECTED
Influenza A By PCR: NEGATIVE
Influenza B By PCR: NEGATIVE

## 2014-03-30 LAB — RAPID STREP SCREEN (MED CTR MEBANE ONLY): Streptococcus, Group A Screen (Direct): NEGATIVE

## 2014-03-30 LAB — I-STAT CG4 LACTIC ACID, ED: Lactic Acid, Venous: 1.03 mmol/L (ref 0.5–2.2)

## 2014-03-30 MED ORDER — SODIUM CHLORIDE 0.9 % IV BOLUS (SEPSIS)
1000.0000 mL | Freq: Once | INTRAVENOUS | Status: AC
Start: 2014-03-30 — End: 2014-03-30
  Administered 2014-03-30: 1000 mL via INTRAVENOUS

## 2014-03-30 MED ORDER — ACETAMINOPHEN 500 MG PO TABS
1000.0000 mg | ORAL_TABLET | Freq: Once | ORAL | Status: AC
  Administered 2014-03-30: 1000 mg via ORAL
  Filled 2014-03-30: qty 2

## 2014-03-30 MED ORDER — BENZONATATE 100 MG PO CAPS: 100.0000 mg | ORAL_CAPSULE | Freq: Three times a day (TID) | ORAL | Status: DC | PRN

## 2014-03-30 NOTE — ED Notes (Signed)
Pt began running a fever (high of 102) and developed a sore throat and productive cough this past Tuesday. Reports dry heaving since Wednesday and runny nose; denies vomiting/diarrhea, head/earaches.

## 2014-03-30 NOTE — ED Provider Notes (Signed)
TIME SEEN: 10:05 AM   CHIEF COMPLAINT: fever, cough, sore throat   HPI: Pt is a 69 y.o. M with history of IgA Kappa myeloma with amyloidosis on weekly Velcade with oral Cytoxan/Decadron who presents to the ED with complaints of 4 days of fever, reactive cough, sore throat, nausea and dry heaving. States he is seen by his oncologist Dr. Marin Olp when symptoms started. He was then seen again and started on azithromycin yesterday. Denies headache, neck pain or neck stiffness, shortness of breath, vomiting or diarrhea, rash. He has had an influenza vaccination. No sick contacts or recent travel.  ROS: See HPI Constitutional:  fever  Eyes: no drainage  ENT: no runny nose   Cardiovascular:  no chest pain  Resp: no SOB  GI: no vomiting GU: no dysuria Integumentary: no rash  Allergy: no hives  Musculoskeletal: no leg swelling  Neurological: no slurred speech ROS otherwise negative  PAST MEDICAL HISTORY/PAST SURGICAL HISTORY:  Past Medical History  Diagnosis Date  . Hyperlipidemia   . Dyspnea   . Prostatitis     acute and chronic  . Nephrolithiasis     MEDICATIONS:  Prior to Admission medications   Medication Sig Start Date End Date Taking? Authorizing Provider  aspirin EC 81 MG tablet Take by mouth.    Historical Provider, MD  azithromycin (ZITHROMAX) 250 MG tablet Take as directed. 03/29/14   Volanda Napoleon, MD  cholecalciferol (VITAMIN D) 1000 UNITS tablet Take 1,000 Units by mouth daily. Takes 2 in am    Historical Provider, MD  clorazepate (TRANXENE) 3.75 MG tablet Take 1 tablet, IF NEEDED, every 6 hours for agitation. Patient not taking: Reported on 03/26/2014 12/11/13   Volanda Napoleon, MD  cyclophosphamide (CYTOXAN) 50 MG tablet Take 11 tablets weekly on an empty stomach 1 hour before or 2 hours after meals. Amyloidosis E85.9 02/28/14   Volanda Napoleon, MD  dexamethasone (DECADRON) 4 MG tablet Take 5 tablets weekly with Chemotherapy. 12/11/13   Volanda Napoleon, MD  famciclovir  (FAMVIR) 500 MG tablet Take 1 tablet (500 mg total) by mouth 3 (three) times daily. 12/25/13   Volanda Napoleon, MD  fluconazole (DIFLUCAN) 200 MG tablet Take 200 mg by mouth every 7 (seven) days.    Historical Provider, MD  minoxidil (ROGAINE) 2 % external solution Apply 1 application topically daily.     Historical Provider, MD  ondansetron (ZOFRAN) 8 MG tablet Take 0.5 tablets (4 mg total) by mouth every 8 (eight) hours as needed for nausea or vomiting. 02/22/14   Volanda Napoleon, MD  prochlorperazine (COMPAZINE) 10 MG tablet Take 1 tablet (10 mg total) by mouth every 6 (six) hours as needed for nausea or vomiting. Patient not taking: Reported on 03/26/2014 11/13/13   Volanda Napoleon, MD  Tamsulosin HCl (FLOMAX) 0.4 MG CAPS Take 0.4 mg by mouth 2 (two) times daily.     Historical Provider, MD  Testosterone (ANDROGEL PUMP TD) Place 3 sprays onto the skin daily. daily    Historical Provider, MD  zolpidem (AMBIEN) 5 MG tablet Take 0.5-1 tablets (2.5-5 mg total) by mouth at bedtime as needed for sleep. 12/25/13   Volanda Napoleon, MD    ALLERGIES:  Allergies  Allergen Reactions  . Penicillins Swelling and Rash    SOCIAL HISTORY:  History  Substance Use Topics  . Smoking status: Former Smoker    Quit date: 03/15/1977  . Smokeless tobacco: Never Used     Comment: quit 35 years  ago  . Alcohol Use: Yes    FAMILY HISTORY: Family History  Problem Relation Age of Onset  . Cancer Father     pancreatic  . Pancreatic cancer Father   . Parkinsonism Mother     EXAM: BP 147/72 mmHg  Pulse 106  Temp(Src) 101.2 F (38.4 C) (Oral)  Resp 24  Ht 5\' 6"  (1.676 m)  Wt 158 lb (71.668 kg)  BMI 25.51 kg/m2  SpO2 93% CONSTITUTIONAL: Alert and oriented and responds appropriately to questions. Well-appearing; well-nourished HEAD: Normocephalic EYES: Conjunctivae clear, PERRL ENT: normal nose; no rhinorrhea; moist mucous membranes; pharynx without lesions noted NECK: Supple, no meningismus, no LAD   CARD: Regular and mildly tachycardic; S1 and S2 appreciated; no murmurs, no clicks, no rubs, no gallops RESP: Normal chest excursion without splinting or tachypnea; breath sounds clear and equal bilaterally; no wheezes, no rhonchi, no rales, tender to palpation of the right chest wall without crepitus or ecchymosis or deformity, no lesions noted over the chest ABD/GI: Normal bowel sounds; non-distended; soft, non-tender, no rebound, no guarding BACK:  The back appears normal and is non-tender to palpation, there is no CVA tenderness EXT: Normal ROM in all joints; non-tender to palpation; no edema; normal capillary refill; no cyanosis    SKIN: Normal color for age and race; warm, no rash NEURO: Moves all extremities equally PSYCH: The patient's mood and manner are appropriate. Grooming and personal hygiene are appropriate.  MEDICAL DECISION MAKING: Patient here with fever of 101.2 was on chemotherapy. We'll obtain labs, urine, cultures, chest x-ray. He is otherwise well-appearing, nontoxic. We'll give IV fluids, treat fever with Tylenol.  ED PROGRESS: Patient has a mild leukocytosis of 11.7 with left shift. Otherwise labs unremarkable. Chest x-ray clear. Flu negative. Urinalysis shows small hemoglobin and bacteria but no other sign of infection and he is not having urinary symptoms. Urine culture is pending. Strep swab negative. I feel symptoms are likely due to a viral URI. Given he is very well-appearing, no hypoxia, heart rate now in the 60s once fluids and antipyretics given I feel he is safe to be discharged home. Discussed this with patient and wife are comfortable with this plan. Have advised close outpatient follow-up. He sees his oncologist weekly. Discussed return precautions.     [place], DO 03/30/14 1537

## 2014-03-30 NOTE — Discharge Instructions (Signed)
You may alternate between Tylenol 1000 mg every 6 hours as needed for fever and pain and ibuprofen 600 mg every 8 hours as needed for fever and pain. Please do not take ibuprofen for more than 5 days. Please take ibuprofen with food.   Upper Respiratory Infection, Adult An upper respiratory infection (URI) is also sometimes known as the common cold. The upper respiratory tract includes the nose, sinuses, throat, trachea, and bronchi. Bronchi are the airways leading to the lungs. Most people improve within 1 week, but symptoms can last up to 2 weeks. A residual cough may last even longer.  CAUSES Many different viruses can infect the tissues lining the upper respiratory tract. The tissues become irritated and inflamed and often become very moist. Mucus production is also common. A cold is contagious. You can easily spread the virus to others by oral contact. This includes kissing, sharing a glass, coughing, or sneezing. Touching your mouth or nose and then touching a surface, which is then touched by another person, can also spread the virus. SYMPTOMS  Symptoms typically develop 1 to 3 days after you come in contact with a cold virus. Symptoms vary from person to person. They may include:  Runny nose.  Sneezing.  Nasal congestion.  Sinus irritation.  Sore throat.  Loss of voice (laryngitis).  Cough.  Fatigue.  Muscle aches.  Loss of appetite.  Headache.  Low-grade fever. DIAGNOSIS  You might diagnose your own cold based on familiar symptoms, since most people get a cold 2 to 3 times a year. Your caregiver can confirm this based on your exam. Most importantly, your caregiver can check that your symptoms are not due to another disease such as strep throat, sinusitis, pneumonia, asthma, or epiglottitis. Blood tests, throat tests, and X-rays are not necessary to diagnose a common cold, but they may sometimes be helpful in excluding other more serious diseases. Your caregiver will decide  if any further tests are required. RISKS AND COMPLICATIONS  You may be at risk for a more severe case of the common cold if you smoke cigarettes, have chronic heart disease (such as heart failure) or lung disease (such as asthma), or if you have a weakened immune system. The very young and very old are also at risk for more serious infections. Bacterial sinusitis, middle ear infections, and bacterial pneumonia can complicate the common cold. The common cold can worsen asthma and chronic obstructive pulmonary disease (COPD). Sometimes, these complications can require emergency medical care and may be life-threatening. PREVENTION  The best way to protect against getting a cold is to practice good hygiene. Avoid oral or hand contact with people with cold symptoms. Wash your hands often if contact occurs. There is no clear evidence that vitamin C, vitamin E, echinacea, or exercise reduces the chance of developing a cold. However, it is always recommended to get plenty of rest and practice good nutrition. TREATMENT  Treatment is directed at relieving symptoms. There is no cure. Antibiotics are not effective, because the infection is caused by a virus, not by bacteria. Treatment may include:  Increased fluid intake. Sports drinks offer valuable electrolytes, sugars, and fluids.  Breathing heated mist or steam (vaporizer or shower).  Eating chicken soup or other clear broths, and maintaining good nutrition.  Getting plenty of rest.  Using gargles or lozenges for comfort.  Controlling fevers with ibuprofen or acetaminophen as directed by your caregiver.  Increasing usage of your inhaler if you have asthma. Zinc gel and zinc lozenges,  taken in the first 24 hours of the common cold, can shorten the duration and lessen the severity of symptoms. Pain medicines may help with fever, muscle aches, and throat pain. A variety of non-prescription medicines are available to treat congestion and runny nose. Your  caregiver can make recommendations and may suggest nasal or lung inhalers for other symptoms.  HOME CARE INSTRUCTIONS   Only take over-the-counter or prescription medicines for pain, discomfort, or fever as directed by your caregiver.  Use a warm mist humidifier or inhale steam from a shower to increase air moisture. This may keep secretions moist and make it easier to breathe.  Drink enough water and fluids to keep your urine clear or pale yellow.  Rest as needed.  Return to work when your temperature has returned to normal or as your caregiver advises. You may need to stay home longer to avoid infecting others. You can also use a face mask and careful hand washing to prevent spread of the virus. SEEK MEDICAL CARE IF:   After the first few days, you feel you are getting worse rather than better.  You need your caregiver's advice about medicines to control symptoms.  You develop chills, worsening shortness of breath, or brown or red sputum. These may be signs of pneumonia.  You develop yellow or brown nasal discharge or pain in the face, especially when you bend forward. These may be signs of sinusitis.  You develop a fever, swollen neck glands, pain with swallowing, or white areas in the back of your throat. These may be signs of strep throat. SEEK IMMEDIATE MEDICAL CARE IF:   You have a fever.  You develop severe or persistent headache, ear pain, sinus pain, or chest pain.  You develop wheezing, a prolonged cough, cough up blood, or have a change in your usual mucus (if you have chronic lung disease).  You develop sore muscles or a stiff neck. Document Released: 08/25/2000 Document Revised: 05/24/2011 Document Reviewed: 06/06/2013 [facility] Patient Information 2015 [place], [place]. This information is not intended to replace advice given to you by your health care provider. Make sure you discuss any questions you have with your health care provider.

## 2014-03-31 ENCOUNTER — Other Ambulatory Visit: Payer: Self-pay | Admitting: Hematology & Oncology

## 2014-03-31 LAB — URINE CULTURE
Colony Count: NO GROWTH
Culture: NO GROWTH

## 2014-04-01 ENCOUNTER — Telehealth: Payer: Self-pay | Admitting: *Deleted

## 2014-04-01 ENCOUNTER — Telehealth: Payer: Self-pay | Admitting: Hematology & Oncology

## 2014-04-01 DIAGNOSIS — E859 Amyloidosis, unspecified: Secondary | ICD-10-CM

## 2014-04-01 LAB — CULTURE, GROUP A STREP

## 2014-04-01 MED ORDER — ZOLPIDEM TARTRATE 5 MG PO TABS: 2.5000 mg | ORAL_TABLET | Freq: Every evening | ORAL | Status: DC | PRN

## 2014-04-01 MED ORDER — FAMCICLOVIR 500 MG PO TABS: 500.0000 mg | ORAL_TABLET | Freq: Three times a day (TID) | ORAL | Status: DC

## 2014-04-01 NOTE — Telephone Encounter (Signed)
Patient called cx 04/02/14 apt due to not feeling well.  He resch for 04/05/14

## 2014-04-01 NOTE — Telephone Encounter (Addendum)
Relayed patient tests results  Received request for medication refill.   Followed up with patient about his fever. Patient went to the ED at [facility] this past weekend. Got IV antibiotics and oxygen. Fever is almost gone, but patient is starting to feel better.   ----- Message from Volanda Napoleon, MD sent at 03/29/2014  2:05 PM EST ----- Please call and that him know that the myeloma levels are still nice and low and stable. Thanks

## 2014-04-02 ENCOUNTER — Ambulatory Visit: Payer: Medicare Other

## 2014-04-02 ENCOUNTER — Other Ambulatory Visit: Payer: Medicare Other | Admitting: Lab

## 2014-04-04 ENCOUNTER — Other Ambulatory Visit: Payer: Self-pay | Admitting: Hematology & Oncology

## 2014-04-04 ENCOUNTER — Other Ambulatory Visit: Payer: Self-pay | Admitting: Oncology

## 2014-04-04 DIAGNOSIS — E859 Amyloidosis, unspecified: Secondary | ICD-10-CM

## 2014-04-05 ENCOUNTER — Ambulatory Visit (HOSPITAL_BASED_OUTPATIENT_CLINIC_OR_DEPARTMENT_OTHER): Payer: Medicare Other

## 2014-04-05 ENCOUNTER — Other Ambulatory Visit (HOSPITAL_BASED_OUTPATIENT_CLINIC_OR_DEPARTMENT_OTHER): Payer: Medicare Other | Admitting: Lab

## 2014-04-05 ENCOUNTER — Telehealth: Payer: Self-pay | Admitting: *Deleted

## 2014-04-05 DIAGNOSIS — E859 Amyloidosis, unspecified: Secondary | ICD-10-CM

## 2014-04-05 DIAGNOSIS — C9000 Multiple myeloma not having achieved remission: Secondary | ICD-10-CM | POA: Diagnosis not present

## 2014-04-05 DIAGNOSIS — Z5112 Encounter for antineoplastic immunotherapy: Secondary | ICD-10-CM

## 2014-04-05 LAB — CMP (CANCER CENTER ONLY)
ALT(SGPT): 23 U/L (ref 10–47)
AST: 20 U/L (ref 11–38)
Albumin: 3 g/dL — ABNORMAL LOW (ref 3.3–5.5)
Alkaline Phosphatase: 95 U/L — ABNORMAL HIGH (ref 26–84)
BUN, Bld: 23 mg/dL — ABNORMAL HIGH (ref 7–22)
CO2: 29 mEq/L (ref 18–33)
Calcium: 9.8 mg/dL (ref 8.0–10.3)
Chloride: 98 mEq/L (ref 98–108)
Creat: 1 mg/dl (ref 0.6–1.2)
Glucose, Bld: 94 mg/dL (ref 73–118)
Potassium: 4.2 mEq/L (ref 3.3–4.7)
Sodium: 141 mEq/L (ref 128–145)
Total Bilirubin: 0.4 mg/dl (ref 0.20–1.60)
Total Protein: 7.6 g/dL (ref 6.4–8.1)

## 2014-04-05 LAB — CBC WITH DIFFERENTIAL (CANCER CENTER ONLY)
BASO#: 0.1 10*3/uL (ref 0.0–0.2)
BASO%: 1.1 % (ref 0.0–2.0)
EOS%: 2.3 % (ref 0.0–7.0)
Eosinophils Absolute: 0.1 10*3/uL (ref 0.0–0.5)
HCT: 41 % (ref 38.7–49.9)
HGB: 13.3 g/dL (ref 13.0–17.1)
LYMPH#: 0.9 10*3/uL (ref 0.9–3.3)
LYMPH%: 15.2 % (ref 14.0–48.0)
MCH: 32.8 pg (ref 28.0–33.4)
MCHC: 32.4 g/dL (ref 32.0–35.9)
MCV: 101 fL — ABNORMAL HIGH (ref 82–98)
MONO#: 0.3 10*3/uL (ref 0.1–0.9)
MONO%: 5 % (ref 0.0–13.0)
NEUT#: 4.3 10*3/uL (ref 1.5–6.5)
NEUT%: 76.4 % (ref 40.0–80.0)
Platelets: 537 10*3/uL — ABNORMAL HIGH (ref 145–400)
RBC: 4.05 10*6/uL — ABNORMAL LOW (ref 4.20–5.70)
RDW: 13.8 % (ref 11.1–15.7)
WBC: 5.6 10*3/uL (ref 4.0–10.0)

## 2014-04-05 LAB — CULTURE, BLOOD (ROUTINE X 2)
Culture: NO GROWTH
Culture: NO GROWTH

## 2014-04-05 LAB — TECHNOLOGIST REVIEW CHCC SATELLITE

## 2014-04-05 MED ORDER — BORTEZOMIB CHEMO SQ INJECTION 3.5 MG (2.5MG/ML)
1.5000 mg/m2 | Freq: Once | INTRAMUSCULAR | Status: AC
  Administered 2014-04-05: 2.75 mg via SUBCUTANEOUS
  Filled 2014-04-05: qty 2.75

## 2014-04-05 MED ORDER — ONDANSETRON HCL 8 MG PO TABS
8.0000 mg | ORAL_TABLET | Freq: Once | ORAL | Status: AC
  Administered 2014-04-05: 8 mg via ORAL

## 2014-04-05 MED ORDER — ONDANSETRON HCL 8 MG PO TABS
ORAL_TABLET | ORAL | Status: AC
  Filled 2014-04-05: qty 1

## 2014-04-05 NOTE — Telephone Encounter (Signed)
Spouse [name] called asking for dr. Marin Olp to review 03-30-2014 CXR   Reports being asked if he was ever told he has COPD.  He doesn't have a PCP and we'd like Dr. Marin Olp to investigate and let us know if he has COPD.  Will route to notify Dr. Marin Olp.

## 2014-04-05 NOTE — Patient Instructions (Signed)
Bortezomib injection What is this medicine? BORTEZOMIB (bor TEZ oh mib) is a chemotherapy drug. It slows the growth of cancer cells. This medicine is used to treat multiple myeloma, and certain lymphomas, such as mantle-cell lymphoma. This medicine may be used for other purposes; ask your health care provider or pharmacist if you have questions. COMMON BRAND NAME(S): Velcade What should I tell my health care provider before I take this medicine? They need to know if you have any of these conditions: -diabetes -heart disease -irregular heartbeat -liver disease -on hemodialysis -low blood counts, like low white blood cells, platelets, or hemoglobin -peripheral neuropathy -taking medicine for blood pressure -an unusual or allergic reaction to bortezomib, mannitol, boron, other medicines, foods, dyes, or preservatives -pregnant or trying to get pregnant -breast-feeding How should I use this medicine? This medicine is for injection into a vein or for injection under the skin. It is given by a health care professional in a hospital or clinic setting. Talk to your pediatrician regarding the use of this medicine in children. Special care may be needed. Overdosage: If you think you have taken too much of this medicine contact a poison control center or emergency room at once. NOTE: This medicine is only for you. Do not share this medicine with others. What if I miss a dose? It is important not to miss your dose. Call your doctor or health care professional if you are unable to keep an appointment. What may interact with this medicine? This medicine may interact with the following medications: -ketoconazole -rifampin -ritonavir -St. John's Wort This list may not describe all possible interactions. Give your health care provider a list of all the medicines, herbs, non-prescription drugs, or dietary supplements you use. Also tell them if you smoke, drink alcohol, or use illegal drugs. Some items  may interact with your medicine. What should I watch for while using this medicine? Visit your doctor for checks on your progress. This drug may make you feel generally unwell. This is not uncommon, as chemotherapy can affect healthy cells as well as cancer cells. Report any side effects. Continue your course of treatment even though you feel ill unless your doctor tells you to stop. You may get drowsy or dizzy. Do not drive, use machinery, or do anything that needs mental alertness until you know how this medicine affects you. Do not stand or sit up quickly, especially if you are an older patient. This reduces the risk of dizzy or fainting spells. In some cases, you may be given additional medicines to help with side effects. Follow all directions for their use. Call your doctor or health care professional for advice if you get a fever, chills or sore throat, or other symptoms of a cold or flu. Do not treat yourself. This drug decreases your body's ability to fight infections. Try to avoid being around people who are sick. This medicine may increase your risk to bruise or bleed. Call your doctor or health care professional if you notice any unusual bleeding. You may need blood work done while you are taking this medicine. In some patients, this medicine may cause a serious brain infection that may cause death. If you have any problems seeing, thinking, speaking, walking, or standing, tell your doctor right away. If you cannot reach your doctor, urgently seek other source of medical care. Do not become pregnant while taking this medicine. Women should inform their doctor if they wish to become pregnant or think they might be pregnant. There is   a potential for serious side effects to an unborn child. Talk to your health care professional or pharmacist for more information. Do not breast-feed an infant while taking this medicine. Check with your doctor or health care professional if you get an attack of  severe diarrhea, nausea and vomiting, or if you sweat a lot. The loss of too much body fluid can make it dangerous for you to take this medicine. What side effects may I notice from receiving this medicine? Side effects that you should report to your doctor or health care professional as soon as possible: -allergic reactions like skin rash, itching or hives, swelling of the face, lips, or tongue -breathing problems -changes in hearing -changes in vision -fast, irregular heartbeat -feeling faint or lightheaded, falls -pain, tingling, numbness in the hands or feet -right upper belly pain -seizures -swelling of the ankles, feet, hands -unusual bleeding or bruising -unusually weak or tired -vomiting -yellowing of the eyes or skin Side effects that usually do not require medical attention (report to your doctor or health care professional if they continue or are bothersome): -changes in emotions or moods -constipation -diarrhea -loss of appetite -headache -irritation at site where injected -nausea This list may not describe all possible side effects. Call your doctor for medical advice about side effects. You may report side effects to FDA at 1-800-FDA-1088. Where should I keep my medicine? This drug is given in a hospital or clinic and will not be stored at home. NOTE: This sheet is a summary. It may not cover all possible information. If you have questions about this medicine, talk to your doctor, pharmacist, or health care provider.  2015, [employer] Standard. (2012-12-25 12:46:32)  

## 2014-04-09 ENCOUNTER — Other Ambulatory Visit: Payer: Medicare Other | Admitting: Lab

## 2014-04-09 DIAGNOSIS — E859 Amyloidosis, unspecified: Secondary | ICD-10-CM

## 2014-04-09 DIAGNOSIS — R599 Enlarged lymph nodes, unspecified: Secondary | ICD-10-CM | POA: Diagnosis not present

## 2014-04-10 ENCOUNTER — Other Ambulatory Visit: Payer: Medicare Other | Admitting: Lab

## 2014-04-10 ENCOUNTER — Ambulatory Visit: Payer: Medicare Other

## 2014-04-11 ENCOUNTER — Ambulatory Visit (HOSPITAL_BASED_OUTPATIENT_CLINIC_OR_DEPARTMENT_OTHER): Payer: Medicare Other | Admitting: Lab

## 2014-04-11 ENCOUNTER — Ambulatory Visit (HOSPITAL_BASED_OUTPATIENT_CLINIC_OR_DEPARTMENT_OTHER): Payer: Medicare Other | Admitting: Hematology & Oncology

## 2014-04-11 ENCOUNTER — Encounter: Payer: Self-pay | Admitting: Hematology & Oncology

## 2014-04-11 ENCOUNTER — Ambulatory Visit (HOSPITAL_BASED_OUTPATIENT_CLINIC_OR_DEPARTMENT_OTHER): Payer: Medicare Other

## 2014-04-11 VITALS — BP 127/60 | HR 83 | Temp 97.8°F | Resp 18 | Ht 66.0 in | Wt 161.0 lb

## 2014-04-11 DIAGNOSIS — E859 Amyloidosis, unspecified: Secondary | ICD-10-CM | POA: Diagnosis not present

## 2014-04-11 DIAGNOSIS — C9000 Multiple myeloma not having achieved remission: Secondary | ICD-10-CM

## 2014-04-11 LAB — CBC WITH DIFFERENTIAL (CANCER CENTER ONLY)
BASO#: 0.1 10*3/uL (ref 0.0–0.2)
BASO%: 1.1 % (ref 0.0–2.0)
EOS%: 2.6 % (ref 0.0–7.0)
Eosinophils Absolute: 0.1 10*3/uL (ref 0.0–0.5)
HCT: 39 % (ref 38.7–49.9)
HGB: 13 g/dL (ref 13.0–17.1)
LYMPH#: 0.8 10*3/uL — ABNORMAL LOW (ref 0.9–3.3)
LYMPH%: 15 % (ref 14.0–48.0)
MCH: 33.2 pg (ref 28.0–33.4)
MCHC: 33.3 g/dL (ref 32.0–35.9)
MCV: 100 fL — ABNORMAL HIGH (ref 82–98)
MONO#: 0.3 10*3/uL (ref 0.1–0.9)
MONO%: 5.6 % (ref 0.0–13.0)
NEUT#: 4 10*3/uL (ref 1.5–6.5)
NEUT%: 75.7 % (ref 40.0–80.0)
Platelets: 483 10*3/uL — ABNORMAL HIGH (ref 145–400)
RBC: 3.92 10*6/uL — ABNORMAL LOW (ref 4.20–5.70)
RDW: 13.8 % (ref 11.1–15.7)
WBC: 5.3 10*3/uL (ref 4.0–10.0)

## 2014-04-11 LAB — UIFE/LIGHT CHAINS/TP QN, 24-HR UR
Albumin, U: DETECTED
Alpha 1, Urine: DETECTED — AB
Alpha 2, Urine: DETECTED — AB
Beta, Urine: DETECTED — AB
Gamma Globulin, Urine: DETECTED — AB
Time: 24 hours
Total Protein, Urine-Ur/day: 240 mg/d — ABNORMAL HIGH (ref <150)
Total Protein, Urine: 10 mg/dL (ref 5–25)
Volume, Urine: 2400 mL

## 2014-04-11 LAB — CMP (CANCER CENTER ONLY)
ALT(SGPT): 15 U/L (ref 10–47)
AST: 18 U/L (ref 11–38)
Albumin: 3.1 g/dL — ABNORMAL LOW (ref 3.3–5.5)
Alkaline Phosphatase: 86 U/L — ABNORMAL HIGH (ref 26–84)
BUN, Bld: 23 mg/dL — ABNORMAL HIGH (ref 7–22)
CO2: 24 mEq/L (ref 18–33)
Calcium: 9.2 mg/dL (ref 8.0–10.3)
Chloride: 106 mEq/L (ref 98–108)
Creat: 0.9 mg/dl (ref 0.6–1.2)
Glucose, Bld: 110 mg/dL (ref 73–118)
Potassium: 4 mEq/L (ref 3.3–4.7)
Sodium: 143 mEq/L (ref 128–145)
Total Bilirubin: 0.5 mg/dl (ref 0.20–1.60)
Total Protein: 6.9 g/dL (ref 6.4–8.1)

## 2014-04-11 MED ORDER — ONDANSETRON HCL 8 MG PO TABS
8.0000 mg | ORAL_TABLET | Freq: Once | ORAL | Status: AC
  Administered 2014-04-11: 8 mg via ORAL

## 2014-04-11 MED ORDER — BORTEZOMIB CHEMO SQ INJECTION 3.5 MG (2.5MG/ML)
1.5000 mg/m2 | Freq: Once | INTRAMUSCULAR | Status: AC
  Administered 2014-04-11: 2.75 mg via SUBCUTANEOUS
  Filled 2014-04-11: qty 2.75

## 2014-04-11 MED ORDER — ONDANSETRON HCL 8 MG PO TABS
ORAL_TABLET | ORAL | Status: AC
  Filled 2014-04-11: qty 1

## 2014-04-11 NOTE — Patient Instructions (Signed)
Bortezomib injection What is this medicine? BORTEZOMIB (bor TEZ oh mib) is a chemotherapy drug. It slows the growth of cancer cells. This medicine is used to treat multiple myeloma, and certain lymphomas, such as mantle-cell lymphoma. This medicine may be used for other purposes; ask your health care provider or pharmacist if you have questions. COMMON BRAND NAME(S): Velcade What should I tell my health care provider before I take this medicine? They need to know if you have any of these conditions: -diabetes -heart disease -irregular heartbeat -liver disease -on hemodialysis -low blood counts, like low white blood cells, platelets, or hemoglobin -peripheral neuropathy -taking medicine for blood pressure -an unusual or allergic reaction to bortezomib, mannitol, boron, other medicines, foods, dyes, or preservatives -pregnant or trying to get pregnant -breast-feeding How should I use this medicine? This medicine is for injection into a vein or for injection under the skin. It is given by a health care professional in a hospital or clinic setting. Talk to your pediatrician regarding the use of this medicine in children. Special care may be needed. Overdosage: If you think you have taken too much of this medicine contact a poison control center or emergency room at once. NOTE: This medicine is only for you. Do not share this medicine with others. What if I miss a dose? It is important not to miss your dose. Call your doctor or health care professional if you are unable to keep an appointment. What may interact with this medicine? This medicine may interact with the following medications: -ketoconazole -rifampin -ritonavir -St. John's Wort This list may not describe all possible interactions. Give your health care provider a list of all the medicines, herbs, non-prescription drugs, or dietary supplements you use. Also tell them if you smoke, drink alcohol, or use illegal drugs. Some items  may interact with your medicine. What should I watch for while using this medicine? Visit your doctor for checks on your progress. This drug may make you feel generally unwell. This is not uncommon, as chemotherapy can affect healthy cells as well as cancer cells. Report any side effects. Continue your course of treatment even though you feel ill unless your doctor tells you to stop. You may get drowsy or dizzy. Do not drive, use machinery, or do anything that needs mental alertness until you know how this medicine affects you. Do not stand or sit up quickly, especially if you are an older patient. This reduces the risk of dizzy or fainting spells. In some cases, you may be given additional medicines to help with side effects. Follow all directions for their use. Call your doctor or health care professional for advice if you get a fever, chills or sore throat, or other symptoms of a cold or flu. Do not treat yourself. This drug decreases your body's ability to fight infections. Try to avoid being around people who are sick. This medicine may increase your risk to bruise or bleed. Call your doctor or health care professional if you notice any unusual bleeding. You may need blood work done while you are taking this medicine. In some patients, this medicine may cause a serious brain infection that may cause death. If you have any problems seeing, thinking, speaking, walking, or standing, tell your doctor right away. If you cannot reach your doctor, urgently seek other source of medical care. Do not become pregnant while taking this medicine. Women should inform their doctor if they wish to become pregnant or think they might be pregnant. There is   a potential for serious side effects to an unborn child. Talk to your health care professional or pharmacist for more information. Do not breast-feed an infant while taking this medicine. Check with your doctor or health care professional if you get an attack of  severe diarrhea, nausea and vomiting, or if you sweat a lot. The loss of too much body fluid can make it dangerous for you to take this medicine. What side effects may I notice from receiving this medicine? Side effects that you should report to your doctor or health care professional as soon as possible: -allergic reactions like skin rash, itching or hives, swelling of the face, lips, or tongue -breathing problems -changes in hearing -changes in vision -fast, irregular heartbeat -feeling faint or lightheaded, falls -pain, tingling, numbness in the hands or feet -right upper belly pain -seizures -swelling of the ankles, feet, hands -unusual bleeding or bruising -unusually weak or tired -vomiting -yellowing of the eyes or skin Side effects that usually do not require medical attention (report to your doctor or health care professional if they continue or are bothersome): -changes in emotions or moods -constipation -diarrhea -loss of appetite -headache -irritation at site where injected -nausea This list may not describe all possible side effects. Call your doctor for medical advice about side effects. You may report side effects to FDA at 1-800-FDA-1088. Where should I keep my medicine? This drug is given in a hospital or clinic and will not be stored at home. NOTE: This sheet is a summary. It may not cover all possible information. If you have questions about this medicine, talk to your doctor, pharmacist, or health care provider.  2015, [employer] Standard. (2012-12-25 12:46:32)  

## 2014-04-11 NOTE — Progress Notes (Signed)
Hematology and Oncology Follow Up Visit  William Barron [id number] 08-18-1945 69 y.o. 04/11/2014   Principle Diagnosis:   IgA Kappa myeloma with amyloidosis  Current Therapy:    Weekly Velcade with oral Cytoxan/Decadron     Interim History:  William Barron is in for followup. He actually is going up to the [facility] in early February for a second opinion regarding transplant. He was seen by the transplant doctors at [facility]. They're not sure that he would benefit from a transplant. His appointment at [facility] will be February 2.  He had to go to the emergency room couple weeks ago. He had a high temperature. His temperature was about 102. He felt quite sick. He had nausea but no vomiting. He has some cough. Has some shortness of breath. A chest x-ray was unremarkable for any pneumonia.  He was placed on some antibiotic for myself. I gave him a Z-Pak. This does not do much.  Of note, I think that he just got Zometa a couple days before he got sick. Is possible that he may have had a reaction to the Zometa. I will have to make sure that his next Zometa will be given over about 45 minutes or so.  Surprisingly enough, his wife is with him now. He has done a very thorough job in trying to not let her know what he has been going through. He just did not want her to worry. However, she did find out. He actually is really that she found out.  I talked to her at length. I answered a lot of her questions. She really just wanted to know was going on with him. She wanted to know what he had. She wanted to know what the future would hold.  He looks real good right now. He feels pretty good right now. He still has some shortness of breath. His oxygen saturations have been in the high 90s.  He's had no diarrhea or constipation. He's had no leg swelling. He is on aspirin. He is on Famvir.  His performance status is ECOG 1.  Medications:  Current outpatient prescriptions:  .  ANDROGEL PUMP  20.25 MG/ACT (1.62%) GEL, Apply topically every morning. , Disp: , Rfl:  .  aspirin EC 81 MG tablet, 81 mg daily. , Disp: , Rfl:  .  cholecalciferol (VITAMIN D) 1000 UNITS tablet, Take 1,000 Units by mouth daily. Takes 2 in am, Disp: , Rfl:  .  cyclophosphamide (CYTOXAN) 50 MG tablet, Take 11 tablets weekly on an empty stomach 1 hour before or 2 hours after meals. Amyloidosis E85.9, Disp: 50 tablet, Rfl: 2 .  dexamethasone (DECADRON) 4 MG tablet, TAKE 10 TABLETS BY MOUTH WEEKLY WITH CHEMOTHERAPY (Patient taking differently: TAKE 5 TABLETS BY MOUTH WEEKLY WITH CHEMOTHERAPY), Disp: 40 tablet, Rfl: 2 .  famciclovir (FAMVIR) 500 MG tablet, Take 1 tablet (500 mg total) by mouth 3 (three) times daily., Disp: 90 tablet, Rfl: 5 .  fluconazole (DIFLUCAN) 200 MG tablet, Take 200 mg by mouth every 7 (seven) days., Disp: , Rfl:  .  minoxidil (ROGAINE) 2 % external solution, Apply 1 application topically daily. , Disp: , Rfl:  .  ondansetron (ZOFRAN) 8 MG tablet, Take 0.5 tablets (4 mg total) by mouth every 8 (eight) hours as needed for nausea or vomiting., Disp: 30 tablet, Rfl: 0 .  Tamsulosin HCl (FLOMAX) 0.4 MG CAPS, Take 0.4 mg by mouth 2 (two) times daily. , Disp: , Rfl:  .  Testosterone (  ANDROGEL PUMP TD), Place 3 sprays onto the skin daily. daily, Disp: , Rfl:  .  zolpidem (AMBIEN) 5 MG tablet, Take 0.5-1 tablets (2.5-5 mg total) by mouth at bedtime as needed for sleep., Disp: 30 tablet, Rfl: 3 .  azithromycin (ZITHROMAX) 250 MG tablet, Take as directed. (Patient not taking: Reported on 04/11/2014), Disp: 6 each, Rfl: 0  Allergies:  Allergies  Allergen Reactions  . Penicillins Swelling and Rash    Past Medical History, Surgical history, Social history, and Family History were reviewed and updated.  Review of Systems: As above  Physical Exam:  height is 5\' 6"  (1.676 m) and weight is 161 lb (73.029 kg). His oral temperature is 97.8 F (36.6 C). His blood pressure is 127/60 and his pulse is 83. His  respiration is 18.   Well-developed well-nourished white gentleman. Head and neck exam shows no ocular or oral lesions. Has no palpable cervical or supraclavicular lymph nodes. Lungs are clear. Cardiac exam regular rate and rhythm with no murmurs rubs or bruits. Abdomen is soft. He has good bowel sounds. There is no fluid wave. Is no palpable liver or spleen tip. Extremities shows no clubbing, cyanosis or edema. He has good strength. He has good range of motion of his joints. Skin exam shows no rashes, ecchymosis or petechia. Neurological exam shows no focal neurological deficits.  Lab Results  Component Value Date   WBC 5.3 04/11/2014   HGB 13.0 04/11/2014   HCT 39.0 04/11/2014   MCV 100* 04/11/2014   PLT 483* 04/11/2014     Chemistry      Component Value Date/Time   NA 143 04/11/2014 0823   NA 133* 03/30/2014 1035   NA 141 04/17/2013 1044   K 4.0 04/11/2014 0823   K 3.7 03/30/2014 1035   K 4.1 04/17/2013 1044   CL 106 04/11/2014 0823   CL 99 03/30/2014 1035   CO2 24 04/11/2014 0823   CO2 25 03/30/2014 1035   CO2 28 04/17/2013 1044   BUN 23* 04/11/2014 0823   BUN 18 03/30/2014 1035   BUN 17.3 04/17/2013 1044   CREATININE 0.9 04/11/2014 0823   CREATININE 1.30 03/30/2014 1035   CREATININE 1.1 04/17/2013 1044      Component Value Date/Time   CALCIUM 9.2 04/11/2014 0823   CALCIUM 9.2 03/30/2014 1035   CALCIUM 9.8 04/17/2013 1044   ALKPHOS 86* 04/11/2014 0823   ALKPHOS 74 11/20/2013 1159   ALKPHOS 89 04/17/2013 1044   AST 18 04/11/2014 0823   AST 13 11/20/2013 1159   AST 16 04/17/2013 1044   ALT 15 04/11/2014 0823   ALT 12 11/20/2013 1159   ALT 17 04/17/2013 1044   BILITOT 0.50 04/11/2014 0823   BILITOT 0.5 11/20/2013 1159   BILITOT 0.45 04/17/2013 1044        Impression and Plan: William Barron is 69 year old gentleman with IgA myeloma with amyloidosis.   We will continue him on therapy. The question is whether or not he will get a transplant. He will be going to  the [facility] for an evaluation next week.  We will see what his 24 hour urine shows.  I will see him back in about 2 weeks or so. It'll be very interesting to see what the [facility] specialists have to say.   Volanda Napoleon, MD 1/28/20166:09 PM

## 2014-04-13 LAB — 24 HR URINE,KAPPA/LAMBDA LIGHT CHAINS
24H Urine Volume: 2400 mL/24 h
Measured Kappa Chain: <0.4 mg/dL (ref <2.00)
Measured Lambda Chain: <0.4 mg/dL (ref <2.00)

## 2014-04-15 LAB — PROTEIN ELECTROPHORESIS, SERUM, WITH REFLEX
Albumin ELP: 52.5 % — ABNORMAL LOW (ref 55.8–66.1)
Alpha-1-Globulin: 6.1 % — ABNORMAL HIGH (ref 2.9–4.9)
Alpha-2-Globulin: 14.2 % — ABNORMAL HIGH (ref 7.1–11.8)
Beta 2: 5.7 % (ref 3.2–6.5)
Beta Globulin: 5.9 % (ref 4.7–7.2)
Gamma Globulin: 15.6 % (ref 11.1–18.8)
M-Spike, %: 0.57 g/dL
Total Protein, Serum Electrophoresis: 6.4 g/dL (ref 6.0–8.3)

## 2014-04-15 LAB — IGG, IGA, IGM
IgA: 672 mg/dL — ABNORMAL HIGH (ref 68–379)
IgG (Immunoglobin G), Serum: 353 mg/dL — ABNORMAL LOW (ref 650–1600)
IgM, Serum: 11 mg/dL — ABNORMAL LOW (ref 41–251)

## 2014-04-15 LAB — BETA 2 MICROGLOBULIN, SERUM: Beta-2 Microglobulin: 1.8 mg/L (ref <=2.51)

## 2014-04-15 LAB — IFE INTERPRETATION

## 2014-04-15 LAB — KAPPA/LAMBDA LIGHT CHAINS
Kappa free light chain: 4.35 mg/dL — ABNORMAL HIGH (ref 0.33–1.94)
Kappa:Lambda Ratio: 4.68 — ABNORMAL HIGH (ref 0.26–1.65)
Lambda Free Lght Chn: 0.93 mg/dL (ref 0.57–2.63)

## 2014-04-15 LAB — LACTATE DEHYDROGENASE: LDH: 111 U/L (ref 94–250)

## 2014-04-16 DIAGNOSIS — C9000 Multiple myeloma not having achieved remission: Secondary | ICD-10-CM | POA: Diagnosis not present

## 2014-04-16 DIAGNOSIS — E859 Amyloidosis, unspecified: Secondary | ICD-10-CM | POA: Diagnosis not present

## 2014-04-16 DIAGNOSIS — R0600 Dyspnea, unspecified: Secondary | ICD-10-CM | POA: Diagnosis not present

## 2014-04-17 DIAGNOSIS — E859 Amyloidosis, unspecified: Secondary | ICD-10-CM | POA: Diagnosis not present

## 2014-04-17 DIAGNOSIS — R0600 Dyspnea, unspecified: Secondary | ICD-10-CM | POA: Diagnosis not present

## 2014-04-18 DIAGNOSIS — E859 Amyloidosis, unspecified: Secondary | ICD-10-CM | POA: Diagnosis not present

## 2014-04-19 DIAGNOSIS — C9000 Multiple myeloma not having achieved remission: Secondary | ICD-10-CM | POA: Diagnosis not present

## 2014-04-30 ENCOUNTER — Ambulatory Visit (HOSPITAL_BASED_OUTPATIENT_CLINIC_OR_DEPARTMENT_OTHER): Payer: Medicare Other | Admitting: Hematology & Oncology

## 2014-04-30 ENCOUNTER — Encounter: Payer: Self-pay | Admitting: Hematology & Oncology

## 2014-04-30 ENCOUNTER — Ambulatory Visit (HOSPITAL_BASED_OUTPATIENT_CLINIC_OR_DEPARTMENT_OTHER): Payer: Medicare Other | Admitting: Lab

## 2014-04-30 ENCOUNTER — Ambulatory Visit: Payer: Medicare Other

## 2014-04-30 VITALS — BP 131/65 | HR 75 | Temp 98.0°F | Resp 75 | Ht 68.0 in | Wt 165.0 lb

## 2014-04-30 DIAGNOSIS — E859 Amyloidosis, unspecified: Secondary | ICD-10-CM | POA: Diagnosis not present

## 2014-04-30 DIAGNOSIS — C9000 Multiple myeloma not having achieved remission: Secondary | ICD-10-CM

## 2014-04-30 LAB — CMP (CANCER CENTER ONLY)
ALT(SGPT): 13 U/L (ref 10–47)
AST: 18 U/L (ref 11–38)
Albumin: 3.4 g/dL (ref 3.3–5.5)
Alkaline Phosphatase: 81 U/L (ref 26–84)
BUN, Bld: 22 mg/dL (ref 7–22)
CO2: 27 mEq/L (ref 18–33)
Calcium: 9.6 mg/dL (ref 8.0–10.3)
Chloride: 103 mEq/L (ref 98–108)
Creat: 0.9 mg/dl (ref 0.6–1.2)
Glucose, Bld: 113 mg/dL (ref 73–118)
Potassium: 4.1 mEq/L (ref 3.3–4.7)
Sodium: 143 mEq/L (ref 128–145)
Total Bilirubin: 0.5 mg/dl (ref 0.20–1.60)
Total Protein: 7.1 g/dL (ref 6.4–8.1)

## 2014-04-30 LAB — CBC WITH DIFFERENTIAL (CANCER CENTER ONLY)
BASO#: 0.1 10*3/uL (ref 0.0–0.2)
BASO%: 1.1 % (ref 0.0–2.0)
EOS%: 4.5 % (ref 0.0–7.0)
Eosinophils Absolute: 0.3 10*3/uL (ref 0.0–0.5)
HCT: 43.1 % (ref 38.7–49.9)
HGB: 14.2 g/dL (ref 13.0–17.1)
LYMPH#: 1.2 10*3/uL (ref 0.9–3.3)
LYMPH%: 22 % (ref 14.0–48.0)
MCH: 32.6 pg (ref 28.0–33.4)
MCHC: 32.9 g/dL (ref 32.0–35.9)
MCV: 99 fL — ABNORMAL HIGH (ref 82–98)
MONO#: 0.6 10*3/uL (ref 0.1–0.9)
MONO%: 9.9 % (ref 0.0–13.0)
NEUT#: 3.5 10*3/uL (ref 1.5–6.5)
NEUT%: 62.5 % (ref 40.0–80.0)
Platelets: 250 10*3/uL (ref 145–400)
RBC: 4.36 10*6/uL (ref 4.20–5.70)
RDW: 14.1 % (ref 11.1–15.7)
WBC: 5.5 10*3/uL (ref 4.0–10.0)

## 2014-05-01 ENCOUNTER — Telehealth: Payer: Self-pay | Admitting: Hematology & Oncology

## 2014-05-01 NOTE — Telephone Encounter (Signed)
Left pt message with 5-17 appointment

## 2014-05-02 ENCOUNTER — Encounter: Payer: Self-pay | Admitting: Podiatry

## 2014-05-02 ENCOUNTER — Ambulatory Visit (INDEPENDENT_AMBULATORY_CARE_PROVIDER_SITE_OTHER): Payer: Medicare Other | Admitting: Podiatry

## 2014-05-02 ENCOUNTER — Ambulatory Visit: Payer: Medicare Other | Admitting: Podiatry

## 2014-05-02 DIAGNOSIS — S86111A Strain of other muscle(s) and tendon(s) of posterior muscle group at lower leg level, right leg, initial encounter: Secondary | ICD-10-CM

## 2014-05-02 DIAGNOSIS — S96811A Strain of other specified muscles and tendons at ankle and foot level, right foot, initial encounter: Secondary | ICD-10-CM

## 2014-05-02 DIAGNOSIS — M6789 Other specified disorders of synovium and tendon, multiple sites: Secondary | ICD-10-CM

## 2014-05-02 DIAGNOSIS — M76829 Posterior tibial tendinitis, unspecified leg: Secondary | ICD-10-CM

## 2014-05-02 LAB — PROTEIN ELECTROPHORESIS, SERUM, WITH REFLEX
Albumin ELP: 55.3 % — ABNORMAL LOW (ref 55.8–66.1)
Alpha-1-Globulin: 7.5 % — ABNORMAL HIGH (ref 2.9–4.9)
Alpha-2-Globulin: 12 % — ABNORMAL HIGH (ref 7.1–11.8)
Beta 2: 4.6 % (ref 3.2–6.5)
Beta Globulin: 6.1 % (ref 4.7–7.2)
Gamma Globulin: 14.5 % (ref 11.1–18.8)
M-Spike, %: 0.52 g/dL
Total Protein, Serum Electrophoresis: 6.6 g/dL (ref 6.0–8.3)

## 2014-05-02 LAB — IGG, IGA, IGM
IgA: 671 mg/dL — ABNORMAL HIGH (ref 68–379)
IgG (Immunoglobin G), Serum: 410 mg/dL — ABNORMAL LOW (ref 650–1600)
IgM, Serum: 10 mg/dL — ABNORMAL LOW (ref 41–251)

## 2014-05-02 LAB — BETA 2 MICROGLOBULIN, SERUM: Beta-2 Microglobulin: 1.66 mg/L (ref <=2.51)

## 2014-05-02 LAB — LACTATE DEHYDROGENASE: LDH: 104 U/L (ref 94–250)

## 2014-05-02 LAB — KAPPA/LAMBDA LIGHT CHAINS
Kappa free light chain: 3.55 mg/dL — ABNORMAL HIGH (ref 0.33–1.94)
Kappa:Lambda Ratio: 4.49 — ABNORMAL HIGH (ref 0.26–1.65)
Lambda Free Lght Chn: 0.79 mg/dL (ref 0.57–2.63)

## 2014-05-02 LAB — IFE INTERPRETATION

## 2014-05-02 NOTE — Progress Notes (Signed)
Hematology and Oncology Follow Up Visit  William Barron [id number] 12-02-1945 69 y.o. 05/02/2014   Principle Diagnosis:   IgA Kappa myeloma with amyloidosis  Current Therapy:    Observation     Interim History:  William Barron is in for followup. He went to the [facility] last week. He saw the head of the division, Dr. Neale Barron. Unfortunately, I do not have any notes from them.  From what William Barron says, he does not think that William Barron needs any more therapy. He's has a probably has smoldering myeloma right now. I can certainly understand this.  He says that William Barron does not need any stem cell transplantation.  William Barron looks good. He is glad he went up there for a second opinion. He had a nice time and had a very good meeting with Dr. Demetria Barron.  He's working. He's having no problems with fatigue or weakness. They chemotherapy that he was taken because a little bit of tiredness but yet, he had a great overall performance status.  Currently, his M spike is 0.52 g/dL. His IgA level is 671 mg/dL. His  Kappa light chain is 3.55 mg/dL.  His performance status is ECOG 1.  Medications:  Current outpatient prescriptions:  .  ANDROGEL PUMP 20.25 MG/ACT (1.62%) GEL, Apply topically every morning. , Disp: , Rfl:  .  aspirin EC 81 MG tablet, 81 mg daily. , Disp: , Rfl:  .  cholecalciferol (VITAMIN D) 1000 UNITS tablet, Take 1,000 Units by mouth daily. Takes 2 in am, Disp: , Rfl:  .  minoxidil (ROGAINE) 2 % external solution, Apply 1 application topically daily. , Disp: , Rfl:  .  ondansetron (ZOFRAN) 8 MG tablet, Take 0.5 tablets (4 mg total) by mouth every 8 (eight) hours as needed for nausea or vomiting., Disp: 30 tablet, Rfl: 0 .  cyclophosphamide (CYTOXAN) 50 MG tablet, Take 11 tablets weekly on an empty stomach 1 hour before or 2 hours after meals. Amyloidosis E85.9 (Patient not taking: Reported on 04/30/2014), Disp: 50 tablet, Rfl: 2 .  dexamethasone (DECADRON) 4 MG  tablet, TAKE 10 TABLETS BY MOUTH WEEKLY WITH CHEMOTHERAPY (Patient not taking: Reported on 04/30/2014), Disp: 40 tablet, Rfl: 2 .  famciclovir (FAMVIR) 500 MG tablet, Take 1 tablet (500 mg total) by mouth 3 (three) times daily. (Patient not taking: Reported on 04/30/2014), Disp: 90 tablet, Rfl: 5 .  fluconazole (DIFLUCAN) 200 MG tablet, Take 200 mg by mouth every 7 (seven) days., Disp: , Rfl:  .  zolpidem (AMBIEN) 5 MG tablet, Take 0.5-1 tablets (2.5-5 mg total) by mouth at bedtime as needed for sleep. (Patient not taking: Reported on 04/30/2014), Disp: 30 tablet, Rfl: 3  Allergies:  Allergies  Allergen Reactions  . Penicillins Swelling and Rash    Past Medical History, Surgical history, Social history, and Family History were reviewed and updated.  Review of Systems: As above  Physical Exam:  height is 5\' 8"  (1.727 m) and weight is 165 lb (74.844 kg). His oral temperature is 98 F (36.7 C). His blood pressure is 131/65 and his pulse is 75. His respiration is 75.   Well-developed well-nourished white gentleman. Head and neck exam shows no ocular or oral lesions. Has no palpable cervical or supraclavicular lymph nodes. Lungs are clear. Cardiac exam regular rate and rhythm with no murmurs rubs or bruits. Abdomen is soft. He has good bowel sounds. There is no fluid wave. Is no palpable liver or spleen tip. Extremities shows no clubbing,  cyanosis or edema. He has good strength. He has good range of motion of his joints. Skin exam shows no rashes, ecchymosis or petechia. Neurological exam shows no focal neurological deficits.  Lab Results  Component Value Date   WBC 5.5 04/30/2014   HGB 14.2 04/30/2014   HCT 43.1 04/30/2014   MCV 99* 04/30/2014   PLT 250 04/30/2014     Chemistry      Component Value Date/Time   NA 143 04/30/2014 1019   NA 133* 03/30/2014 1035   NA 141 04/17/2013 1044   K 4.1 04/30/2014 1019   K 3.7 03/30/2014 1035   K 4.1 04/17/2013 1044   CL 103 04/30/2014 1019   CL  99 03/30/2014 1035   CO2 27 04/30/2014 1019   CO2 25 03/30/2014 1035   CO2 28 04/17/2013 1044   BUN 22 04/30/2014 1019   BUN 18 03/30/2014 1035   BUN 17.3 04/17/2013 1044   CREATININE 0.9 04/30/2014 1019   CREATININE 1.30 03/30/2014 1035   CREATININE 1.1 04/17/2013 1044      Component Value Date/Time   CALCIUM 9.6 04/30/2014 1019   CALCIUM 9.2 03/30/2014 1035   CALCIUM 9.8 04/17/2013 1044   ALKPHOS 81 04/30/2014 1019   ALKPHOS 74 11/20/2013 1159   ALKPHOS 89 04/17/2013 1044   AST 18 04/30/2014 1019   AST 13 11/20/2013 1159   AST 16 04/17/2013 1044   ALT 13 04/30/2014 1019   ALT 12 11/20/2013 1159   ALT 17 04/17/2013 1044   BILITOT 0.50 04/30/2014 1019   BILITOT 0.5 11/20/2013 1159   BILITOT 0.45 04/17/2013 1044        Impression and Plan: William Barron is 69 year old gentleman with IgA myeloma with amyloidosis. He is responding.  I slowly have no problems with him not being on therapy. We will monitor him very closely.  I still think that Zometa would be a good idea for him. I think we can do this every time we see him every 3 months.  I spent about 35 minutes with he and his wife. They're very nice. His was fun talking to him. I'm just glad that everything is going so well for him.    Volanda Napoleon, MD 2/18/20163:38 PM

## 2014-05-02 NOTE — Progress Notes (Signed)
Subjective:     Patient ID: William Barron, male   DOB: [birth year], 69 y.o.   MRN: [id number]  HPI patient presents with posterior tibial dysfunction and tear right with chronic loss of strength of the foot for bracing   Review of Systems     Objective:   Physical Exam Neurovascular status was found to be intact with collapse medial longitudinal arch noted right and dysfunction of posterior tibial tendon noted    Assessment:     Chronic posterior tibial damage right with depression of the arch right and problems with gait for this patient    Plan:     At this time reviewed condition and dispensed air zone a brace with all instructions on usage. States it was comfortable and he will reappoint 6 weeks to review but at this time I do not recommend surgery and hope that the brace will give him a lifestyle and allow him to do the types of activities that he wants

## 2014-05-03 ENCOUNTER — Telehealth: Payer: Self-pay | Admitting: Hematology & Oncology

## 2014-05-03 NOTE — Telephone Encounter (Signed)
Pt moved 5-17 to 5-19

## 2014-05-06 ENCOUNTER — Telehealth: Payer: Self-pay | Admitting: *Deleted

## 2014-05-06 NOTE — Telephone Encounter (Addendum)
Patient aware of results  ----- Message from Volanda Napoleon, MD sent at 05/03/2014  5:01 PM EST ----- Please call and let him know that the myeloma studies are nice and low!!!!  We will just watch!!  pete

## 2014-05-07 ENCOUNTER — Other Ambulatory Visit: Payer: Medicare Other | Admitting: Lab

## 2014-05-07 ENCOUNTER — Ambulatory Visit: Payer: Medicare Other

## 2014-05-14 ENCOUNTER — Other Ambulatory Visit: Payer: Medicare Other | Admitting: Lab

## 2014-05-14 ENCOUNTER — Ambulatory Visit: Payer: Medicare Other

## 2014-05-16 DIAGNOSIS — M6789 Other specified disorders of synovium and tendon, multiple sites: Secondary | ICD-10-CM | POA: Diagnosis not present

## 2014-05-23 DIAGNOSIS — C9000 Multiple myeloma not having achieved remission: Secondary | ICD-10-CM | POA: Diagnosis not present

## 2014-05-28 ENCOUNTER — Ambulatory Visit: Payer: Medicare Other | Admitting: Hematology & Oncology

## 2014-05-28 ENCOUNTER — Other Ambulatory Visit: Payer: Medicare Other | Admitting: Lab

## 2014-05-28 ENCOUNTER — Ambulatory Visit: Payer: Medicare Other

## 2014-06-04 ENCOUNTER — Other Ambulatory Visit: Payer: Medicare Other | Admitting: Lab

## 2014-06-04 ENCOUNTER — Ambulatory Visit: Payer: Medicare Other

## 2014-06-11 ENCOUNTER — Ambulatory Visit: Payer: Medicare Other

## 2014-06-11 ENCOUNTER — Other Ambulatory Visit: Payer: Medicare Other | Admitting: Lab

## 2014-06-12 ENCOUNTER — Ambulatory Visit: Payer: Medicare Other | Admitting: Podiatry

## 2014-07-02 DIAGNOSIS — L720 Epidermal cyst: Secondary | ICD-10-CM | POA: Diagnosis not present

## 2014-07-02 DIAGNOSIS — B351 Tinea unguium: Secondary | ICD-10-CM | POA: Diagnosis not present

## 2014-07-02 DIAGNOSIS — M2141 Flat foot [pes planus] (acquired), right foot: Secondary | ICD-10-CM | POA: Diagnosis not present

## 2014-07-02 DIAGNOSIS — Z880 Allergy status to penicillin: Secondary | ICD-10-CM | POA: Diagnosis not present

## 2014-07-02 DIAGNOSIS — Z87891 Personal history of nicotine dependence: Secondary | ICD-10-CM | POA: Diagnosis not present

## 2014-07-02 DIAGNOSIS — M76829 Posterior tibial tendinitis, unspecified leg: Secondary | ICD-10-CM | POA: Insufficient documentation

## 2014-07-02 DIAGNOSIS — M6789 Other specified disorders of synovium and tendon, multiple sites: Secondary | ICD-10-CM | POA: Diagnosis not present

## 2014-07-03 DIAGNOSIS — Z23 Encounter for immunization: Secondary | ICD-10-CM | POA: Diagnosis not present

## 2014-07-03 DIAGNOSIS — Z6828 Body mass index (BMI) 28.0-28.9, adult: Secondary | ICD-10-CM | POA: Diagnosis not present

## 2014-07-03 DIAGNOSIS — E859 Amyloidosis, unspecified: Secondary | ICD-10-CM | POA: Diagnosis not present

## 2014-07-03 DIAGNOSIS — Z79899 Other long term (current) drug therapy: Secondary | ICD-10-CM | POA: Diagnosis not present

## 2014-07-03 DIAGNOSIS — E291 Testicular hypofunction: Secondary | ICD-10-CM | POA: Diagnosis not present

## 2014-07-03 DIAGNOSIS — C9000 Multiple myeloma not having achieved remission: Secondary | ICD-10-CM | POA: Diagnosis not present

## 2014-07-03 DIAGNOSIS — Z0000 Encounter for general adult medical examination without abnormal findings: Secondary | ICD-10-CM | POA: Diagnosis not present

## 2014-07-03 DIAGNOSIS — Z1389 Encounter for screening for other disorder: Secondary | ICD-10-CM | POA: Diagnosis not present

## 2014-07-29 ENCOUNTER — Encounter: Payer: Self-pay | Admitting: Hematology & Oncology

## 2014-07-29 ENCOUNTER — Ambulatory Visit (HOSPITAL_BASED_OUTPATIENT_CLINIC_OR_DEPARTMENT_OTHER): Payer: Medicare Other

## 2014-07-29 ENCOUNTER — Ambulatory Visit (HOSPITAL_BASED_OUTPATIENT_CLINIC_OR_DEPARTMENT_OTHER): Payer: Medicare Other | Admitting: Hematology & Oncology

## 2014-07-29 VITALS — BP 140/63 | HR 79 | Temp 98.0°F | Resp 18 | Ht 68.0 in | Wt 167.0 lb

## 2014-07-29 DIAGNOSIS — E859 Amyloidosis, unspecified: Secondary | ICD-10-CM | POA: Diagnosis not present

## 2014-07-29 DIAGNOSIS — C9000 Multiple myeloma not having achieved remission: Secondary | ICD-10-CM

## 2014-07-29 LAB — CBC WITH DIFFERENTIAL (CANCER CENTER ONLY)
BASO#: 0 10*3/uL (ref 0.0–0.2)
BASO%: 0.7 % (ref 0.0–2.0)
EOS%: 4.4 % (ref 0.0–7.0)
Eosinophils Absolute: 0.3 10*3/uL (ref 0.0–0.5)
HCT: 45 % (ref 38.7–49.9)
HGB: 15.4 g/dL (ref 13.0–17.1)
LYMPH#: 1.1 10*3/uL (ref 0.9–3.3)
LYMPH%: 19.2 % (ref 14.0–48.0)
MCH: 32.2 pg (ref 28.0–33.4)
MCHC: 34.2 g/dL (ref 32.0–35.9)
MCV: 94 fL (ref 82–98)
MONO#: 0.5 10*3/uL (ref 0.1–0.9)
MONO%: 7.9 % (ref 0.0–13.0)
NEUT#: 3.9 10*3/uL (ref 1.5–6.5)
NEUT%: 67.8 % (ref 40.0–80.0)
Platelets: 272 10*3/uL (ref 145–400)
RBC: 4.78 10*6/uL (ref 4.20–5.70)
RDW: 13.6 % (ref 11.1–15.7)
WBC: 5.7 10*3/uL (ref 4.0–10.0)

## 2014-07-29 LAB — CMP (CANCER CENTER ONLY)
ALT(SGPT): 20 U/L (ref 10–47)
AST: 20 U/L (ref 11–38)
Albumin: 3.8 g/dL (ref 3.3–5.5)
Alkaline Phosphatase: 79 U/L (ref 26–84)
BUN, Bld: 16 mg/dL (ref 7–22)
CO2: 30 mEq/L (ref 18–33)
Calcium: 9.4 mg/dL (ref 8.0–10.3)
Chloride: 102 mEq/L (ref 98–108)
Creat: 1.4 mg/dl — ABNORMAL HIGH (ref 0.6–1.2)
Glucose, Bld: 129 mg/dL — ABNORMAL HIGH (ref 73–118)
Potassium: 3.5 mEq/L (ref 3.3–4.7)
Sodium: 141 mEq/L (ref 128–145)
Total Bilirubin: 0.8 mg/dl (ref 0.20–1.60)
Total Protein: 7.4 g/dL (ref 6.4–8.1)

## 2014-07-29 NOTE — Progress Notes (Signed)
Hematology and Oncology Follow Up Visit  William Barron [id number] Sep 14, 1945 69 y.o. 07/29/2014   Principle Diagnosis:   Kappa light chain amyloidosis/smoldering myeloma  Current Therapy:    Observation     Interim History:  William Barron is in for followup. He went to the [facility] last week. He saw the head of the division, William Barron. I got the report from him. They just feel that observation would be reasonable. I definitely 10 do agree with this.  He and his wife are headed to [place] this weekend. They will be gone for 2 weeks.  He is doing well. He's had no problems since we last saw him in February. He's had no infections. He's had no weight loss or weight gain. There's been no change in bowel or bladder habits.  Overall, his performance status is ECOG 1.  Medications:  Current outpatient prescriptions:  .  ANDROGEL PUMP 20.25 MG/ACT (1.62%) GEL, Apply topically every morning. , Disp: , Rfl:  .  aspirin EC 81 MG tablet, 81 mg daily. , Disp: , Rfl:  .  cholecalciferol (VITAMIN D) 1000 UNITS tablet, Take 1,000 Units by mouth daily. Takes 2 in am, Disp: , Rfl:  .  minoxidil (ROGAINE) 2 % external solution, Apply 1 application topically daily. , Disp: , Rfl:  .  zolpidem (AMBIEN) 5 MG tablet, Take 0.5-1 tablets (2.5-5 mg total) by mouth at bedtime as needed for sleep., Disp: 30 tablet, Rfl: 3  Allergies:  Allergies  Allergen Reactions  . Penicillins Swelling and Rash    Past Medical History, Surgical history, Social history, and Family History were reviewed and updated.  Review of Systems: As above  Physical Exam:  height is 5\' 8"  (1.727 m) and weight is 167 lb (75.751 kg). His oral temperature is 98 F (36.7 C). His blood pressure is 140/63 and his pulse is 79. His respiration is 18.   Well-developed well-nourished white gentleman. Head and neck exam shows no ocular or oral lesions. Has no palpable cervical or supraclavicular lymph nodes. Lungs are clear.  Cardiac exam regular rate and rhythm with no murmurs rubs or bruits. Abdomen is soft. He has good bowel sounds. There is no fluid wave. Is no palpable liver or spleen tip. Extremities shows no clubbing, cyanosis or edema. He has good strength. He has good range of motion of his joints. Skin exam shows no rashes, ecchymosis or petechia. Neurological exam shows no focal neurological deficits.  Lab Results  Component Value Date   WBC 5.7 07/29/2014   HGB 15.4 07/29/2014   HCT 45.0 07/29/2014   MCV 94 07/29/2014   PLT 272 07/29/2014     Chemistry      Component Value Date/Time   NA 141 07/29/2014 1404   NA 133* 03/30/2014 1035   NA 141 04/17/2013 1044   K 3.5 07/29/2014 1404   K 3.7 03/30/2014 1035   K 4.1 04/17/2013 1044   CL 102 07/29/2014 1404   CL 99 03/30/2014 1035   CO2 30 07/29/2014 1404   CO2 25 03/30/2014 1035   CO2 28 04/17/2013 1044   BUN 16 07/29/2014 1404   BUN 18 03/30/2014 1035   BUN 17.3 04/17/2013 1044   CREATININE 1.4* 07/29/2014 1404   CREATININE 1.30 03/30/2014 1035   CREATININE 1.1 04/17/2013 1044      Component Value Date/Time   CALCIUM 9.4 07/29/2014 1404   CALCIUM 9.2 03/30/2014 1035   CALCIUM 9.8 04/17/2013 1044   ALKPHOS 79  07/29/2014 1404   ALKPHOS 74 11/20/2013 1159   ALKPHOS 89 04/17/2013 1044   AST 20 07/29/2014 1404   AST 13 11/20/2013 1159   AST 16 04/17/2013 1044   ALT 20 07/29/2014 1404   ALT 12 11/20/2013 1159   ALT 17 04/17/2013 1044   BILITOT 0.80 07/29/2014 1404   BILITOT 0.5 11/20/2013 1159   BILITOT 0.45 04/17/2013 1044        Impression and Plan: William Barron is 69 year old gentleman with Kappa light chain amyloidosis/smoldering myeloma. He was treated with Cytoxan/Velcade/Decadron. He had a good response. The question was whether he needed a transplant. He went to the [facility].   For now, we'll just follow him along.  We will see him every 3 months and check his studies including urine. I spent about 35 minutes with  him. They're very nice. He is fun to talk to.   I'm just glad that everything is going so well for him.    Volanda Napoleon, MD 5/16/20163:41 PM

## 2014-07-30 ENCOUNTER — Other Ambulatory Visit: Payer: Medicare Other | Admitting: Lab

## 2014-07-30 ENCOUNTER — Ambulatory Visit: Payer: Medicare Other | Admitting: Hematology & Oncology

## 2014-07-31 LAB — PROTEIN ELECTROPHORESIS, SERUM, WITH REFLEX
Abnormal Protein Band1: 0.7 g/dL
Albumin ELP: 3.9 g/dL (ref 3.8–4.8)
Alpha-1-Globulin: 0.3 g/dL (ref 0.2–0.3)
Alpha-2-Globulin: 0.7 g/dL (ref 0.5–0.9)
Beta 2: 0.4 g/dL (ref 0.2–0.5)
Beta Globulin: 0.4 g/dL (ref 0.4–0.6)
Gamma Globulin: 1.2 g/dL (ref 0.8–1.7)
Total Protein, Serum Electrophoresis: 6.8 g/dL (ref 6.1–8.1)

## 2014-07-31 LAB — IFE INTERPRETATION

## 2014-07-31 LAB — KAPPA/LAMBDA LIGHT CHAINS
Kappa free light chain: 3.76 mg/dL — ABNORMAL HIGH (ref 0.33–1.94)
Kappa:Lambda Ratio: 2.87 — ABNORMAL HIGH (ref 0.26–1.65)
Lambda Free Lght Chn: 1.31 mg/dL (ref 0.57–2.63)

## 2014-07-31 LAB — IGG, IGA, IGM
IgA: 641 mg/dL — ABNORMAL HIGH (ref 68–379)
IgG (Immunoglobin G), Serum: 434 mg/dL — ABNORMAL LOW (ref 650–1600)
IgM, Serum: 9 mg/dL — ABNORMAL LOW (ref 41–251)

## 2014-07-31 LAB — LACTATE DEHYDROGENASE: LDH: 117 U/L (ref 94–250)

## 2014-08-01 ENCOUNTER — Ambulatory Visit: Payer: Medicare Other | Admitting: Hematology & Oncology

## 2014-08-01 ENCOUNTER — Other Ambulatory Visit: Payer: Medicare Other

## 2014-08-22 DIAGNOSIS — C9000 Multiple myeloma not having achieved remission: Secondary | ICD-10-CM | POA: Diagnosis not present

## 2014-08-26 DIAGNOSIS — Z6827 Body mass index (BMI) 27.0-27.9, adult: Secondary | ICD-10-CM | POA: Diagnosis not present

## 2014-08-26 DIAGNOSIS — R4189 Other symptoms and signs involving cognitive functions and awareness: Secondary | ICD-10-CM | POA: Diagnosis not present

## 2014-08-26 DIAGNOSIS — C9000 Multiple myeloma not having achieved remission: Secondary | ICD-10-CM | POA: Diagnosis not present

## 2014-08-26 DIAGNOSIS — E291 Testicular hypofunction: Secondary | ICD-10-CM | POA: Diagnosis not present

## 2014-08-27 DIAGNOSIS — L821 Other seborrheic keratosis: Secondary | ICD-10-CM | POA: Diagnosis not present

## 2014-08-27 DIAGNOSIS — D1801 Hemangioma of skin and subcutaneous tissue: Secondary | ICD-10-CM | POA: Diagnosis not present

## 2014-08-27 DIAGNOSIS — D225 Melanocytic nevi of trunk: Secondary | ICD-10-CM | POA: Diagnosis not present

## 2014-08-27 DIAGNOSIS — Z85820 Personal history of malignant melanoma of skin: Secondary | ICD-10-CM | POA: Diagnosis not present

## 2014-08-27 DIAGNOSIS — L853 Xerosis cutis: Secondary | ICD-10-CM | POA: Diagnosis not present

## 2014-08-28 DIAGNOSIS — C9000 Multiple myeloma not having achieved remission: Secondary | ICD-10-CM | POA: Diagnosis not present

## 2014-09-04 DIAGNOSIS — G3184 Mild cognitive impairment, so stated: Secondary | ICD-10-CM | POA: Diagnosis not present

## 2014-09-04 DIAGNOSIS — C9000 Multiple myeloma not having achieved remission: Secondary | ICD-10-CM | POA: Diagnosis not present

## 2014-09-04 DIAGNOSIS — E859 Amyloidosis, unspecified: Secondary | ICD-10-CM | POA: Diagnosis not present

## 2014-09-04 DIAGNOSIS — R413 Other amnesia: Secondary | ICD-10-CM | POA: Diagnosis not present

## 2014-09-18 DIAGNOSIS — M76822 Posterior tibial tendinitis, left leg: Secondary | ICD-10-CM | POA: Diagnosis not present

## 2014-09-18 DIAGNOSIS — M76821 Posterior tibial tendinitis, right leg: Secondary | ICD-10-CM | POA: Diagnosis not present

## 2014-09-18 DIAGNOSIS — E291 Testicular hypofunction: Secondary | ICD-10-CM | POA: Diagnosis not present

## 2014-09-18 DIAGNOSIS — N401 Enlarged prostate with lower urinary tract symptoms: Secondary | ICD-10-CM | POA: Diagnosis not present

## 2014-09-25 DIAGNOSIS — N5201 Erectile dysfunction due to arterial insufficiency: Secondary | ICD-10-CM | POA: Diagnosis not present

## 2014-09-25 DIAGNOSIS — R351 Nocturia: Secondary | ICD-10-CM | POA: Diagnosis not present

## 2014-09-25 DIAGNOSIS — E291 Testicular hypofunction: Secondary | ICD-10-CM | POA: Diagnosis not present

## 2014-09-25 DIAGNOSIS — N401 Enlarged prostate with lower urinary tract symptoms: Secondary | ICD-10-CM | POA: Diagnosis not present

## 2014-09-25 DIAGNOSIS — N138 Other obstructive and reflux uropathy: Secondary | ICD-10-CM | POA: Diagnosis not present

## 2014-09-26 ENCOUNTER — Ambulatory Visit (INDEPENDENT_AMBULATORY_CARE_PROVIDER_SITE_OTHER): Payer: Medicare Other | Admitting: Neurology

## 2014-09-26 ENCOUNTER — Encounter: Payer: Self-pay | Admitting: Neurology

## 2014-09-26 VITALS — BP 130/78 | HR 76 | Resp 14 | Ht 66.0 in | Wt 163.0 lb

## 2014-09-26 DIAGNOSIS — R413 Other amnesia: Secondary | ICD-10-CM | POA: Diagnosis not present

## 2014-09-26 DIAGNOSIS — E7219 Other disorders of sulfur-bearing amino-acid metabolism: Secondary | ICD-10-CM

## 2014-09-26 DIAGNOSIS — E859 Amyloidosis, unspecified: Secondary | ICD-10-CM

## 2014-09-26 DIAGNOSIS — E559 Vitamin D deficiency, unspecified: Secondary | ICD-10-CM | POA: Diagnosis not present

## 2014-09-26 DIAGNOSIS — R7989 Other specified abnormal findings of blood chemistry: Secondary | ICD-10-CM | POA: Insufficient documentation

## 2014-09-26 DIAGNOSIS — C9000 Multiple myeloma not having achieved remission: Secondary | ICD-10-CM | POA: Diagnosis not present

## 2014-09-26 DIAGNOSIS — E7211 Homocystinuria: Secondary | ICD-10-CM

## 2014-09-26 NOTE — Progress Notes (Signed)
[facility]  PATIENT: William Barron DOB: 27-Dec-1945  REFERRING DOCTOR OR PCP:  [name] SOURCE: patient, records in EMR and from [place], MRI report and lab results  _________________________________   HISTORICAL  CHIEF COMPLAINT:  Chief Complaint  Patient presents with  . Memory Loss    William Barron is here with his wife William Barron for eval of memory loss.  Sts. problem is with little things--nothing that affects daily life--but wife sts. he has gotten lost driving at night, and he has more anger issues.  Sts. they would like to discuss changes on recent mri brain.  Wife is concerned memory problems are caused by chemo he had for multiple myeloma./fim    HISTORY OF PRESENT ILLNESS:  I had the pleasure seeing you patient, William Barron, at [facility] for neurologic consultation regarding his memory loss.   His wife began to notice subtle cognitive issues about 2 years ago, such as not knowing how to get from point A to point B.   He denies much problems with non-driving executive function or with verbal fluency.  He has noted more difficulty retaining what he reads and decreased attention at times.  He is an Forensic psychologist.    He had 1 year of Velcade treatment (March 2015 to February 2016) and he felt the memory worsened some during that time and continues to worsen.     He has IgA kappa multiple myeloma and amyloidosis. He has seen Dr.Ennover [place] in the [place] and has been referred to [facility] and [facility] for opinions, as well. He was treated with weekly Velcade and oral Cytoxan and Decadron.     BM stem cell transplant was considered but not recommended.    He denies depression.   He does not snore or have sleep dysregulated breathing.    He denies diabetes or thyroid disease.     He smoked about 2 ppd for 10 years many years ago.     The MRI report from 09/04/2014 showed nonspecific white matter findings, likely due to chronic small vessel ischemic  changes. The actual images were not available for review.  Montreal Cognitive Assessment  09/26/2014  Visuospatial/ Executive (0/5) 5  Naming (0/3) 2  Attention: Read list of digits (0/2) 1  Attention: Read list of letters (0/1) 1  Attention: Serial 7 subtraction starting at 100 (0/3) 3  Language: Repeat phrase (0/2) 2  Language : Fluency (0/1) 1  Abstraction (0/2) 2  Delayed Recall (0/5) 3  Orientation (0/6) 6  Total 26  Adjusted Score (based on education) 26      REVIEW OF SYSTEMS: Constitutional: No fevers, chills, sweats, or change in appetite Eyes: No visual changes, double vision, eye pain Ear, nose and throat: Notes  hearing loss.  No ear pain, nasal congestion, sore throat Cardiovascular: No chest pain, palpitations Respiratory: No shortness of breath at rest or with exertion.   No wheezes GastrointestinaI: No nausea, vomiting, diarrhea, abdominal pain, fecal incontinence Genitourinary: Some urinary retention / BPH Musculoskeletal: No neck pain, back pain Integumentary: No rash, pruritus, skin lesions Neurological: as above Psychiatric: No depression at this time.  No anxiety Endocrine: No palpitations, diaphoresis, change in appetite, change in weigh or increased thirst Hematologic/Lymphatic: He has multiple myeloma with amyloidosis. Allergic/Immunologic: No itchy/runny eyes, nasal congestion, recent allergic reactions, rashes  ALLERGIES: Allergies  Allergen Reactions  . Penicillins Swelling and Rash    HOME MEDICATIONS:  Current outpatient prescriptions:  .  ANDROGEL PUMP 20.25 MG/ACT (1.62%)  GEL, Apply topically every morning. , Disp: , Rfl:  .  aspirin EC 81 MG tablet, 81 mg daily. , Disp: , Rfl:  .  cholecalciferol (VITAMIN D) 1000 UNITS tablet, Take 1,000 Units by mouth daily. Takes 2 in am, Disp: , Rfl:  .  minoxidil (ROGAINE) 2 % external solution, Apply 1 application topically daily. , Disp: , Rfl:  .  tamsulosin (FLOMAX) 0.4 MG CAPS capsule, Take  0.4 mg by mouth., Disp: , Rfl:  .  zolpidem (AMBIEN) 5 MG tablet, Take 0.5-1 tablets (2.5-5 mg total) by mouth at bedtime as needed for sleep., Disp: 30 tablet, Rfl: 3 .  STENDRA 200 MG TABS, , Disp: , Rfl:   PAST MEDICAL HISTORY: Past Medical History  Diagnosis Date  . Hyperlipidemia   . Dyspnea   . Prostatitis     acute and chronic  . Nephrolithiasis   . Cancer 2016    amylodosis    PAST SURGICAL HISTORY: Past Surgical History  Procedure Laterality Date  . Rotator cuff repair    . Appendectomy    . Elbow surgery  2003    right  . Colonoscopy  04/07/00  . Hernia repair      FAMILY HISTORY: Family History  Problem Relation Age of Onset  . Cancer Father     pancreatic  . Pancreatic cancer Father   . Parkinsonism Mother     SOCIAL HISTORY:  History   Social History  . Marital Status: Married    Spouse Name: N/A  . Number of Children: N/A  . Years of Education: N/A   Occupational History  . Not on file.   Social History Main Topics  . Smoking status: Former Smoker    Quit date: 03/15/1977  . Smokeless tobacco: Never Used     Comment: quit 35 years ago  . Alcohol Use: 0.0 oz/week    0 Standard drinks or equivalent per week     Comment: approx 1 drink/night  . Drug Use: No  . Sexual Activity: Yes    Birth Control/ Protection: None   Other Topics Concern  . Not on file   Social History Narrative     PHYSICAL EXAM  Filed Vitals:   09/26/14 1015  BP: 130/78  Pulse: 76  Resp: 14  Height: _0  (1.676 m)  Weight: 163 lb (73.936 kg)    Body mass index is 26.32 kg/(m^2).   General: The patient is well-developed and well-nourished and in no acute distress  Eyes:  Funduscopic exam shows normal optic discs and retinal vessels.  Neck: The neck is supple, no carotid bruits are noted.  The neck is nontender.  Cardiovascular: The heart has a regular rate and rhythm with a normal S1 and S2. There were no murmurs, gallops or rubs. Lungs are clear to  auscultation.  Skin: Extremities are without significant edema.  Musculoskeletal:  Back is nontender  Neurologic Exam  Mental status: The patient is alert and oriented x 3 at the time of the examination. The patient has apparent normal recent and remote memory, with an apparently normal attention span and concentration ability.   Speech is normal.  Cranial nerves: Extraocular movements are full. Pupils are equal, round, and reactive to light and accomodation.  Visual fields are full.  Facial symmetry is present. There is good facial sensation to soft touch bilaterally.Facial strength is normal.  Trapezius and sternocleidomastoid strength is normal. No dysarthria is noted.  The tongue is midline, and the patient has  symmetric elevation of the soft palate. No obvious hearing deficits are noted.  Motor:  Muscle bulk is normal.   Tone is normal. Strength is  5 / 5 in all 4 extremities.   Sensory: Sensory testing is intact to pinprick, soft touch and vibration sensation in all 4 extremities.  Coordination: Cerebellar testing reveals good finger-nose-finger and heel-to-shin bilaterally.  Gait and station: Station is normal.   Gait is normal. Tandem gait is normal. Romberg is negative.   Reflexes: Deep tendon reflexes are symmetric and normal bilaterally.   Plantar responses are flexor.    DIAGNOSTIC DATA (LABS, IMAGING, TESTING) - I reviewed patient records, labs, notes, testing and imaging myself where available.  Lab Results  Component Value Date   WBC 5.7 07/29/2014   HGB 15.4 07/29/2014   HCT 45.0 07/29/2014   MCV 94 07/29/2014   PLT 272 07/29/2014      Component Value Date/Time   NA 141 07/29/2014 1404   NA 133* 03/30/2014 1035   NA 141 04/17/2013 1044   K 3.5 07/29/2014 1404   K 3.7 03/30/2014 1035   K 4.1 04/17/2013 1044   CL 102 07/29/2014 1404   CL 99 03/30/2014 1035   CO2 30 07/29/2014 1404   CO2 25 03/30/2014 1035   CO2 28 04/17/2013 1044   GLUCOSE 129* 07/29/2014  1404   GLUCOSE 134* 03/30/2014 1035   GLUCOSE 106 04/17/2013 1044   BUN 16 07/29/2014 1404   BUN 18 03/30/2014 1035   BUN 17.3 04/17/2013 1044   CREATININE 1.4* 07/29/2014 1404   CREATININE 1.30 03/30/2014 1035   CREATININE 1.1 04/17/2013 1044   CALCIUM 9.4 07/29/2014 1404   CALCIUM 9.2 03/30/2014 1035   CALCIUM 9.8 04/17/2013 1044   PROT 7.4 07/29/2014 1404   PROT 6.9 11/20/2013 1159   PROT 7.6 04/17/2013 1044   ALBUMIN 4.1 11/20/2013 1159   ALBUMIN 4.1 04/17/2013 1044   AST 20 07/29/2014 1404   AST 13 11/20/2013 1159   AST 16 04/17/2013 1044   ALT 20 07/29/2014 1404   ALT 12 11/20/2013 1159   ALT 17 04/17/2013 1044   ALKPHOS 79 07/29/2014 1404   ALKPHOS 74 11/20/2013 1159   ALKPHOS 89 04/17/2013 1044   BILITOT 0.80 07/29/2014 1404   BILITOT 0.5 11/20/2013 1159   BILITOT 0.45 04/17/2013 1044   GFRNONAA 54* 03/30/2014 1035   GFRAA 63* 03/30/2014 1035   Lab Results  Component Value Date   CHOL 189 03/29/2011   HDL 45.30 03/29/2011   LDLCALC 126* 03/29/2011   TRIG 87.0 03/29/2011   CHOLHDL 4 03/29/2011       ASSESSMENT AND PLAN  Memory disorder - Plan: Homocysteine, Vit D  25 hydroxy (rtn osteoporosis monitoring), Vitamin B12, Ambulatory referral to Neuropsychology  Amyloidosis - Plan: Homocysteine, Vit D  25 hydroxy (rtn osteoporosis monitoring), Vitamin B12, Ambulatory referral to Neuropsychology  Multiple myeloma - Plan: Homocysteine, Vit D  25 hydroxy (rtn osteoporosis monitoring), Vitamin B12, Ambulatory referral to Neuropsychology  Elevated homocysteine - Plan: Homocysteine, Vit D  25 hydroxy (rtn osteoporosis monitoring), Vitamin B12, Ambulatory referral to Neuropsychology   In summary, William Barron is a 69 year old well educated man with worsening memory over the last 2 years. I had a long conversation with him and his wife about the significance of the MRI findings and his cognitive assessment results. He has "moderate" deep white matter nonspecific  changes on MRI. The actual images are not available but this usually means small vessel ischemic changes  in a patient of his age. He does not have any known modifiable risk factors for small vessel ischemic change.   If the degree is mild to moderate, then cognitive changes would not be expected. However, if more moderate to severe, these changes could certainly be playing a role. I will try to get the actual images for review. He was on Velcade for almost a year and memory loss is listed as a possible side effect. Because some issues started before the chemotherapy, I am not sure whether there is an association.  Today, he scored 26/30 on the Montral cognitive assessment test. This is in the normal range but I would think this would be mild cognitive impairment for some with postgraduate education. Therefore, I would like him to get formal neurocognitive testing with a neuropsychologist.   I'll also check homocystine as increased levels may lead to small vessel ischemic changes as well as vitamin D and vitamin B12 to rule out deficiency that could lead to cognitive changes.  He will return to see me in 2 months or sooner if there are new or worsening neurologic symptoms.  60 minute face-to-face evaluation with greater than one half of the time counseling and coordinating care for the memory loss.  Thank you for asking me to see [place] to [facility] for neurologic consultation. Please let me know if I can be of further assistance with him or other patients in the future.   Dwan Hemmelgarn A. Felecia Shelling, MD, PhD 10/31/2991, 71:69 AM Certified in Neurology, Clinical Neurophysiology, Sleep Medicine, Pain Medicine and Neuroimaging  [facility] [address] [place], [place] [address] [phone]

## 2014-09-28 LAB — VITAMIN B12: Vitamin B-12: 359 pg/mL (ref 211–946)

## 2014-09-28 LAB — VITAMIN D 25 HYDROXY (VIT D DEFICIENCY, FRACTURES): Vit D, 25-Hydroxy: 24.1 ng/mL — ABNORMAL LOW (ref 30.0–100.0)

## 2014-09-28 LAB — HOMOCYSTEINE: Homocysteine: 12.5 umol/L (ref 0.0–15.0)

## 2014-09-30 ENCOUNTER — Telehealth: Payer: Self-pay | Admitting: *Deleted

## 2014-09-30 MED ORDER — VITAMIN D (ERGOCALCIFEROL) 1.25 MG (50000 UNIT) PO CAPS: 50000.0000 [IU] | ORAL_CAPSULE | ORAL | Status: DC

## 2014-09-30 NOTE — Telephone Encounter (Signed)
-----   Message from Britt Bottom, MD sent at 09/30/2014  1:28 PM EDT ----- Vitamin D is a little low. Lets do 50,000 units weekly 8 weeks and then 2000 units daily OTC

## 2014-09-30 NOTE — Telephone Encounter (Signed)
I have spoken with William Barron this afternoon and per RAS, advised that vit. level is low, and of the need for rx. vitamin d 50,000iu weekly for 8 weeks, then otc vit. d 2,000iu daily after that.  He verbalized understanding of same, is agreeable--rx. escribed to [facility] on [address] per his request/fim

## 2014-10-15 ENCOUNTER — Emergency Department (HOSPITAL_BASED_OUTPATIENT_CLINIC_OR_DEPARTMENT_OTHER): Payer: Medicare Other

## 2014-10-15 ENCOUNTER — Encounter (HOSPITAL_BASED_OUTPATIENT_CLINIC_OR_DEPARTMENT_OTHER): Payer: Self-pay | Admitting: *Deleted

## 2014-10-15 ENCOUNTER — Emergency Department (HOSPITAL_BASED_OUTPATIENT_CLINIC_OR_DEPARTMENT_OTHER)
Admission: EM | Admit: 2014-10-15 | Discharge: 2014-10-15 | Disposition: A | Discharge status: Home / Self Care | Payer: Medicare Other | Attending: Emergency Medicine | Admitting: Emergency Medicine

## 2014-10-15 DIAGNOSIS — Z87442 Personal history of urinary calculi: Secondary | ICD-10-CM | POA: Diagnosis not present

## 2014-10-15 DIAGNOSIS — Z9889 Other specified postprocedural states: Secondary | ICD-10-CM | POA: Diagnosis not present

## 2014-10-15 DIAGNOSIS — Z859 Personal history of malignant neoplasm, unspecified: Secondary | ICD-10-CM | POA: Insufficient documentation

## 2014-10-15 DIAGNOSIS — N200 Calculus of kidney: Secondary | ICD-10-CM | POA: Diagnosis not present

## 2014-10-15 DIAGNOSIS — R109 Unspecified abdominal pain: Secondary | ICD-10-CM | POA: Diagnosis not present

## 2014-10-15 DIAGNOSIS — Z9049 Acquired absence of other specified parts of digestive tract: Secondary | ICD-10-CM | POA: Diagnosis not present

## 2014-10-15 DIAGNOSIS — Z7982 Long term (current) use of aspirin: Secondary | ICD-10-CM | POA: Diagnosis not present

## 2014-10-15 DIAGNOSIS — Z79899 Other long term (current) drug therapy: Secondary | ICD-10-CM | POA: Diagnosis not present

## 2014-10-15 DIAGNOSIS — Z8639 Personal history of other endocrine, nutritional and metabolic disease: Secondary | ICD-10-CM | POA: Insufficient documentation

## 2014-10-15 DIAGNOSIS — Z880 Allergy status to penicillin: Secondary | ICD-10-CM | POA: Diagnosis not present

## 2014-10-15 DIAGNOSIS — Z87891 Personal history of nicotine dependence: Secondary | ICD-10-CM | POA: Insufficient documentation

## 2014-10-15 LAB — URINALYSIS, ROUTINE W REFLEX MICROSCOPIC
Bilirubin Urine: NEGATIVE
Glucose, UA: NEGATIVE mg/dL
Hgb urine dipstick: NEGATIVE
Ketones, ur: 15 mg/dL — AB
Leukocytes, UA: NEGATIVE
Nitrite: NEGATIVE
Protein, ur: NEGATIVE mg/dL
Specific Gravity, Urine: 1.031 — ABNORMAL HIGH (ref 1.005–1.030)
Urobilinogen, UA: 0.2 mg/dL (ref 0.0–1.0)
pH: 5.5 (ref 5.0–8.0)

## 2014-10-15 LAB — CBC WITH DIFFERENTIAL/PLATELET
Basophils Absolute: 0 10*3/uL (ref 0.0–0.1)
Basophils Relative: 1 % (ref 0–1)
Eosinophils Absolute: 0.4 10*3/uL (ref 0.0–0.7)
Eosinophils Relative: 7 % — ABNORMAL HIGH (ref 0–5)
HCT: 45.6 % (ref 39.0–52.0)
Hemoglobin: 15.1 g/dL (ref 13.0–17.0)
Lymphocytes Relative: 29 % (ref 12–46)
Lymphs Abs: 1.7 10*3/uL (ref 0.7–4.0)
MCH: 31.3 pg (ref 26.0–34.0)
MCHC: 33.1 g/dL (ref 30.0–36.0)
MCV: 94.6 fL (ref 78.0–100.0)
Monocytes Absolute: 0.6 10*3/uL (ref 0.1–1.0)
Monocytes Relative: 10 % (ref 3–12)
Neutro Abs: 3.1 10*3/uL (ref 1.7–7.7)
Neutrophils Relative %: 53 % (ref 43–77)
Platelets: 265 10*3/uL (ref 150–400)
RBC: 4.82 MIL/uL (ref 4.22–5.81)
RDW: 14.4 % (ref 11.5–15.5)
WBC: 5.8 10*3/uL (ref 4.0–10.5)

## 2014-10-15 LAB — I-STAT CHEM 8, ED
BUN: 26 mg/dL — ABNORMAL HIGH (ref 6–20)
Calcium, Ion: 1.22 mmol/L (ref 1.13–1.30)
Chloride: 104 mmol/L (ref 101–111)
Creatinine, Ser: 1.2 mg/dL (ref 0.61–1.24)
Glucose, Bld: 111 mg/dL — ABNORMAL HIGH (ref 65–99)
HCT: 49 % (ref 39.0–52.0)
Hemoglobin: 16.7 g/dL (ref 13.0–17.0)
Potassium: 4 mmol/L (ref 3.5–5.1)
Sodium: 140 mmol/L (ref 135–145)
TCO2: 23 mmol/L (ref 0–100)

## 2014-10-15 MED ORDER — LIDOCAINE 5 % EX PTCH: 1.0000 | MEDICATED_PATCH | CUTANEOUS | Status: DC

## 2014-10-15 NOTE — ED Provider Notes (Signed)
Pharmacy called patient cannot pay for Lidoderm patches.  canceled prescription for Lidoderm patches. Pharmacist suggested Voltaren gel which was prescribed by me 2 g 4 times daily as needed for pain dispense 100 g tube  Orlie Dakin, MD 10/15/14 [id number]

## 2014-10-15 NOTE — ED Notes (Signed)
Report received from Maudie Mercury, [place] ,care assumed. EDP at bedside.

## 2014-10-15 NOTE — Discharge Instructions (Signed)
Flank Pain °Flank pain refers to pain that is located on the side of the body between the upper abdomen and the back. The pain may occur over a short period of time (acute) or may be long-term or reoccurring (chronic). It may be mild or severe. Flank pain can be caused by many things. °CAUSES  °Some of the more common causes of flank pain include: °· Muscle strains.   °· Muscle spasms.   °· A disease of your spine (vertebral disk disease).   °· A lung infection (pneumonia).   °· Fluid around your lungs (pulmonary edema).   °· A kidney infection.   °· Kidney stones.   °· A very painful skin rash caused by the chickenpox virus (shingles).   °· Gallbladder disease.   °HOME CARE INSTRUCTIONS  °Home care will depend on the cause of your pain. In general, °· Rest as directed by your caregiver. °· Drink enough fluids to keep your urine clear or pale yellow. °· Only take over-the-counter or prescription medicines as directed by your caregiver. Some medicines may help relieve the pain. °· Tell your caregiver about any changes in your pain. °· Follow up with your caregiver as directed. °SEEK IMMEDIATE MEDICAL CARE IF:  °· Your pain is not controlled with medicine.   °· You have new or worsening symptoms. °· Your pain increases.   °· You have abdominal pain.   °· You have shortness of breath.   °· You have persistent nausea or vomiting.   °· You have swelling in your abdomen.   °· You feel faint or pass out.   °· You have blood in your urine. °· You have a fever or persistent symptoms for more than 2-3 days. °· You have a fever and your symptoms suddenly get worse. °MAKE SURE YOU:  °· Understand these instructions. °· Will watch your condition. °· Will get help right away if you are not doing well or get worse. °Document Released: 04/22/2005 Document Revised: 11/24/2011 Document Reviewed: 10/14/2011 °Exit[employer]® Patient Information ©2015 [employer]. This information is not intended to replace advice given to you by your  health care provider. Make sure you discuss any questions you have with your health care provider. ° °

## 2014-10-15 NOTE — ED Notes (Signed)
Pt reports right flank pain that began on Sunday afternoon, pain has gotten progressively worse - pt has been taking ibuprofen at home w/ some relief - admits to hx of kidney stones and states this pain is similar. Denies n/v/d, fever or hematuria.

## 2014-10-15 NOTE — ED Provider Notes (Signed)
CSN: [id number]     Arrival date & time 10/15/14  0559 History   First MD Initiated Contact with Patient 10/15/14 [phone]     Chief Complaint  Patient presents with  . Flank Pain     (Consider location/radiation/quality/duration/timing/severity/associated sxs/prior Treatment) Patient is a 69 y.o. male presenting with flank pain. The history is provided by the patient.  Flank Pain This is a new problem. The current episode started 2 days ago. The problem occurs constantly. The problem has been gradually worsening. Associated symptoms include abdominal pain. Pertinent negatives include no chest pain and no shortness of breath. Associated symptoms comments: Started in the right flank and has gradually moved to the right abd. Nothing aggravates the symptoms. The symptoms are relieved by NSAIDs. Treatments tried: ibuprofen. The treatment provided no relief.    Past Medical History  Diagnosis Date  . Hyperlipidemia   . Dyspnea   . Prostatitis     acute and chronic  . Nephrolithiasis   . Cancer 2016    amylodosis   Past Surgical History  Procedure Laterality Date  . Rotator cuff repair    . Appendectomy    . Elbow surgery  2003    right  . Colonoscopy  04/07/00  . Hernia repair     Family History  Problem Relation Age of Onset  . Cancer Father     pancreatic  . Pancreatic cancer Father   . Parkinsonism Mother    History  Substance Use Topics  . Smoking status: Former Smoker    Quit date: 03/15/1977  . Smokeless tobacco: Never Used     Comment: quit 35 years ago  . Alcohol Use: 0.0 oz/week    0 Standard drinks or equivalent per week     Comment: approx 1 drink/night    Review of Systems  Constitutional: Negative for fever.  Respiratory: Negative for cough and shortness of breath.   Cardiovascular: Negative for chest pain and leg swelling.  Gastrointestinal: Positive for abdominal pain. Negative for nausea, vomiting, diarrhea and blood in stool.  Genitourinary: Positive  for flank pain. Negative for penile pain.  Neurological: Negative for dizziness.  All other systems reviewed and are negative.     Allergies  Penicillins  Home Medications   Prior to Admission medications   Medication Sig Start Date End Date Taking? Authorizing Provider  ANDROGEL PUMP 20.25 MG/ACT (1.62%) GEL Apply topically every morning.  03/27/14  Yes Historical Provider, MD  aspirin EC 81 MG tablet 81 mg daily.    Yes Historical Provider, MD  cholecalciferol (VITAMIN D) 1000 UNITS tablet Take 1,000 Units by mouth daily. Takes 2 in am   Yes Historical Provider, MD  Vitamin D, Ergocalciferol, (DRISDOL) 50000 UNITS CAPS capsule Take 1 capsule (50,000 Units total) by mouth every 7 (seven) days. 09/30/14  Yes Britt Bottom, MD  minoxidil (ROGAINE) 2 % external solution Apply 1 application topically daily.     Historical Provider, MD  STENDRA 200 MG TABS  09/25/14   Historical Provider, MD  tamsulosin (FLOMAX) 0.4 MG CAPS capsule Take 0.4 mg by mouth.    Historical Provider, MD  zolpidem (AMBIEN) 5 MG tablet Take 0.5-1 tablets (2.5-5 mg total) by mouth at bedtime as needed for sleep. 04/01/14   Eliezer Bottom, NP   BP 158/79 mmHg  Pulse 67  Temp(Src) 98 F (36.7 C) (Oral)  Ht 5\' 6"  (1.676 m)  Wt 156 lb (70.761 kg)  BMI 25.19 kg/m2  SpO2 94% Physical Exam  Constitutional: He is oriented to person, place, and time. He appears well-developed and well-nourished. No distress.  HENT:  Head: Normocephalic and atraumatic.  Mouth/Throat: Oropharynx is clear and moist.  Eyes: Conjunctivae and EOM are normal. Pupils are equal, round, and reactive to light.  Neck: Normal range of motion. Neck supple.  Cardiovascular: Normal rate, regular rhythm and intact distal pulses.   No murmur heard. Pulmonary/Chest: Effort normal and breath sounds normal. No respiratory distress. He has no wheezes. He has no rales.  Abdominal: Soft. Normal appearance. He exhibits no distension. There is no  tenderness. There is CVA tenderness. There is no rebound and no guarding.    Mild right flank tenderness and right mid abd tenderness  Musculoskeletal: Normal range of motion. He exhibits no edema or tenderness.  Neurological: He is alert and oriented to person, place, and time.  Skin: Skin is warm and dry. No rash noted. No erythema.  Psychiatric: He has a normal mood and affect. His behavior is normal.  Nursing note and vitals reviewed.   ED Course  Procedures (including critical care time) Labs Review Labs Reviewed  CBC WITH DIFFERENTIAL/PLATELET - Abnormal; Notable for the following:    Eosinophils Relative 7 (*)    All other components within normal limits  URINALYSIS, ROUTINE W REFLEX MICROSCOPIC (NOT AT [facility]) - Abnormal; Notable for the following:    Specific Gravity, Urine 1.031 (*)    Ketones, ur 15 (*)    All other components within normal limits  I-STAT CHEM 8, ED - Abnormal; Notable for the following:    BUN 26 (*)    Glucose, Bld 111 (*)    All other components within normal limits    Imaging Review Ct Renal Stone Study  10/15/2014   CLINICAL DATA:  Right flank pain, onset 36 hours ago. Progressively worsening.  EXAM: CT ABDOMEN AND PELVIS WITHOUT CONTRAST  TECHNIQUE: Multidetector CT imaging of the abdomen and pelvis was performed following the standard protocol without IV contrast.  COMPARISON:  04/27/2013  FINDINGS: There is a 5 mm lower pole left renal calculus. There are no ureteral calculi. There is no hydronephrosis. There are unremarkable unenhanced appearances of the liver, gallbladder, bile ducts, pancreas, spleen and adrenals. The stomach, small bowel and colon are remarkable only for mild uncomplicated colonic diverticulosis. The abdominal aorta is normal in caliber. There is mild atherosclerotic calcification. There is no adenopathy in the abdomen or pelvis.  No acute inflammatory changes are evident in the abdomen or pelvis. There is no ascites. In the lower  chest, hilar and inferior mediastinal adenopathy is again evident without significant interval change. Several nodes are calcified, suggesting granulomatous disease.  No significant musculoskeletal abnormality is evident.  1. No acute findings are evident in the abdomen or pelvis. 2. Left nephrolithiasis 3. Hilar and mediastinal adenopathy, incompletely imaged but the visible portions are unchanged from 04/27/2013.   Electronically Signed   By: Andreas Newport M.D.   On: 10/15/2014 06:55     EKG Interpretation None      MDM   Final diagnoses:  Right flank pain    Pt with symptoms concerning for kidney stone.  Denies infectious sx, or GI symptoms.  Low concern for diverticulitis and no risk factors or history suggestive of AAA.   Patient took ibuprofen prior to arrival and his pain is manageable. He has been taking ibuprofen every 4 hours the last 2 days. He feels like it similar to his kidney stone pain but it's not  as severe. Will ensure no infection with UA, CBC, chem-8 and will get stone study to further eval.  7:14 AM Labs including urine are within normal limits.  CT without obvious signs that would be causing patient's pain. There are no rashes concerning for shingles. Pain could be musculoskeletal in nature. No signs of AAA or other vascular cause for her but his pain. He has no pain in the right upper quadrant concerning for cholelithiasis. Encourage the patient to continue ibuprofen or Tylenol when necessary and also given Lidoderm patches. He will follow-up with his doctor later this week if the pain continues   Blanchie Dessert, MD 10/15/14 [phone]

## 2014-10-20 ENCOUNTER — Encounter (HOSPITAL_BASED_OUTPATIENT_CLINIC_OR_DEPARTMENT_OTHER): Payer: Self-pay | Admitting: *Deleted

## 2014-10-20 ENCOUNTER — Emergency Department (HOSPITAL_BASED_OUTPATIENT_CLINIC_OR_DEPARTMENT_OTHER)
Admission: EM | Admit: 2014-10-20 | Discharge: 2014-10-20 | Disposition: A | Discharge status: Home / Self Care | Payer: Medicare Other | Attending: Emergency Medicine | Admitting: Emergency Medicine

## 2014-10-20 ENCOUNTER — Emergency Department (HOSPITAL_BASED_OUTPATIENT_CLINIC_OR_DEPARTMENT_OTHER): Admission: EM | Admit: 2014-10-20 | Payer: Medicare Other | Source: Home / Self Care

## 2014-10-20 DIAGNOSIS — Z87891 Personal history of nicotine dependence: Secondary | ICD-10-CM | POA: Insufficient documentation

## 2014-10-20 DIAGNOSIS — R109 Unspecified abdominal pain: Secondary | ICD-10-CM | POA: Diagnosis not present

## 2014-10-20 DIAGNOSIS — R1031 Right lower quadrant pain: Secondary | ICD-10-CM | POA: Diagnosis not present

## 2014-10-20 DIAGNOSIS — Z7982 Long term (current) use of aspirin: Secondary | ICD-10-CM | POA: Diagnosis not present

## 2014-10-20 DIAGNOSIS — Z8639 Personal history of other endocrine, nutritional and metabolic disease: Secondary | ICD-10-CM | POA: Diagnosis not present

## 2014-10-20 DIAGNOSIS — Z79899 Other long term (current) drug therapy: Secondary | ICD-10-CM | POA: Insufficient documentation

## 2014-10-20 DIAGNOSIS — Z87442 Personal history of urinary calculi: Secondary | ICD-10-CM | POA: Diagnosis not present

## 2014-10-20 DIAGNOSIS — M545 Low back pain: Secondary | ICD-10-CM | POA: Insufficient documentation

## 2014-10-20 DIAGNOSIS — Z87438 Personal history of other diseases of male genital organs: Secondary | ICD-10-CM | POA: Diagnosis not present

## 2014-10-20 DIAGNOSIS — Z859 Personal history of malignant neoplasm, unspecified: Secondary | ICD-10-CM | POA: Diagnosis not present

## 2014-10-20 DIAGNOSIS — Z880 Allergy status to penicillin: Secondary | ICD-10-CM | POA: Diagnosis not present

## 2014-10-20 MED ORDER — HYDROCODONE-ACETAMINOPHEN 5-325 MG PO TABS: 1.0000 | ORAL_TABLET | Freq: Four times a day (QID) | ORAL | Status: DC | PRN

## 2014-10-20 MED ORDER — PREDNISONE 10 MG PO TABS: 20.0000 mg | ORAL_TABLET | Freq: Two times a day (BID) | ORAL | Status: DC

## 2014-10-20 MED ORDER — VALACYCLOVIR HCL 1 G PO TABS: 1000.0000 mg | ORAL_TABLET | Freq: Three times a day (TID) | ORAL | Status: AC

## 2014-10-20 NOTE — Discharge Instructions (Signed)
Prednisone and valacyclovir as prescribed.  Return to the ER if symptoms significantly worsen or change.   Flank Pain Flank pain refers to pain that is located on the side of the body between the upper abdomen and the back. The pain may occur over a short period of time (acute) or may be long-term or reoccurring (chronic). It may be mild or severe. Flank pain can be caused by many things. CAUSES  Some of the more common causes of flank pain include:  Muscle strains.   Muscle spasms.   A disease of your spine (vertebral disk disease).   A lung infection (pneumonia).   Fluid around your lungs (pulmonary edema).   A kidney infection.   Kidney stones.   A very painful skin rash caused by the chickenpox virus (shingles).   Gallbladder disease.  Fif[facility]-Six care will depend on the cause of your pain. In general,  Rest as directed by your caregiver.  Drink enough fluids to keep your urine clear or pale yellow.  Only take over-the-counter or prescription medicines as directed by your caregiver. Some medicines may help relieve the pain.  Tell your caregiver about any changes in your pain.  Follow up with your caregiver as directed. SEEK IMMEDIATE MEDICAL CARE IF:   Your pain is not controlled with medicine.   You have new or worsening symptoms.  Your pain increases.   You have abdominal pain.   You have shortness of breath.   You have persistent nausea or vomiting.   You have swelling in your abdomen.   You feel faint or pass out.   You have blood in your urine.  You have a fever or persistent symptoms for more than 2-3 days.  You have a fever and your symptoms suddenly get worse. MAKE SURE YOU:   Understand these instructions.  Will watch your condition.  Will get help right away if you are not doing well or get worse. Document Released: 04/22/2005 Document Revised: 11/24/2011 Document Reviewed: 10/14/2011 [facility]  Patient Information 2015 [place], [place]. This information is not intended to replace advice given to you by your health care provider. Make sure you discuss any questions you have with your health care provider.

## 2014-10-20 NOTE — ED Provider Notes (Signed)
CSN: [id number]   Arrival date & time 10/20/14 0025  History  This chart was scribed for  William Speak, MD by Altamease Oiler, ED Scribe. This patient was seen in room MH02/MH02 and the patient's care was started at 12:49 AM.  Chief Complaint  Patient presents with  . Flank Pain    HPI The history is provided by the patient. No language interpreter was used.   William Barron is a 69 y.o. male who presents to the Emergency Department complaining of constant pain across the lower back to the lower right abdomen with onset on 10/14/14. He describes the pain as sharp, aching, and sore to the touch. The pain is improved with ibuprofen every 4 hours and he has none at present. The pain is worse with movement but not deep breathing.  Pt is concerned for shingles. Originally he thought he had a kidney stones but when he was seen in the ED on 10/15/14 he had a negative CT scan. No SOB or rash.   Past Medical History  Diagnosis Date  . Hyperlipidemia   . Dyspnea   . Prostatitis     acute and chronic  . Nephrolithiasis   . Cancer 2016    amylodosis    Past Surgical History  Procedure Laterality Date  . Rotator cuff repair    . Appendectomy    . Elbow surgery  2003    right  . Colonoscopy  04/07/00  . Hernia repair      Family History  Problem Relation Age of Onset  . Cancer Father     pancreatic  . Pancreatic cancer Father   . Parkinsonism Mother     History  Substance Use Topics  . Smoking status: Former Smoker    Quit date: 03/15/1977  . Smokeless tobacco: Never Used     Comment: quit 35 years ago  . Alcohol Use: 0.0 oz/week    0 Standard drinks or equivalent per week     Comment: approx 1 drink/night     Review of Systems 10 Systems reviewed and all are negative for acute change except as noted in the HPI.  Home Medications   Prior to Admission medications   Medication Sig Start Date End Date Taking? Authorizing Provider  ANDROGEL PUMP 20.25 MG/ACT (1.62%) GEL Apply  topically every morning.  03/27/14   Historical Provider, MD  aspirin EC 81 MG tablet 81 mg daily.     Historical Provider, MD  cholecalciferol (VITAMIN D) 1000 UNITS tablet Take 1,000 Units by mouth daily. Takes 2 in am    Historical Provider, MD  lidocaine (LIDODERM) 5 % Place 1 patch onto the skin daily. Remove & Discard patch within 12 hours or as directed by MD 10/15/14   Blanchie Dessert, MD  minoxidil (ROGAINE) 2 % external solution Apply 1 application topically daily.     Historical Provider, MD  STENDRA 200 MG TABS  09/25/14   Historical Provider, MD  tamsulosin (FLOMAX) 0.4 MG CAPS capsule Take 0.4 mg by mouth.    Historical Provider, MD  Vitamin D, Ergocalciferol, (DRISDOL) 50000 UNITS CAPS capsule Take 1 capsule (50,000 Units total) by mouth every 7 (seven) days. 09/30/14   Britt Bottom, MD  zolpidem (AMBIEN) 5 MG tablet Take 0.5-1 tablets (2.5-5 mg total) by mouth at bedtime as needed for sleep. 04/01/14   Eliezer Bottom, NP    Allergies  Penicillins  Triage Vitals: BP 122/76 mmHg  Pulse 72  Temp(Src) 98.2 F (36.8  C) (Oral)  Resp 16  Ht 5\' 6"  (1.676 m)  Wt 156 lb (70.761 kg)  BMI 25.19 kg/m2  SpO2 94%  Physical Exam  Constitutional: He is oriented to person, place, and time. He appears well-developed and well-nourished.  HENT:  Head: Normocephalic.  Eyes: EOM are normal.  Neck: Normal range of motion.  Pulmonary/Chest: Effort normal.  Abdominal: He exhibits no distension.  Musculoskeletal: Normal range of motion.  TTP in the soft tissue of the right flank.   Neurological: He is alert and oriented to person, place, and time.  Psychiatric: He has a normal mood and affect.  Nursing note and vitals reviewed.   ED Course  Procedures   DIAGNOSTIC STUDIES: Oxygen Saturation is 94% on RA, normal by my interpretation.    COORDINATION OF CARE: 12:54 AM Discussed treatment plan with pt at bedside and pt agreed to plan.  Labs Review- Labs Reviewed - No data to  display  Imaging Review No results found.  EKG Interpretation None      MDM   Final diagnoses:  None     Patient believes he has shingles. Although he has no rash, this could be a plausible explanation is nothing in his workup 2 days ago revealed an alternate explanation. He is requesting treatment with steroids and antivirals. He will be given prednisone and Valtrex and advised to return if his symptoms worsen or change.   I personally performed the services described in this documentation, which was scribed in my presence. The recorded information has been reviewed and is accurate.      William Speak, MD 10/20/14 [phone]

## 2014-10-20 NOTE — ED Notes (Signed)
Here on 8/2 for the same. C/o R flank pain, radiates around to R side and abd. worked up for kidney stone when last here (-), returns for increased pain "think it might be shingles, pre rash", (denies: nvd, fever, sob, CP, urinary sx, rash or other sx), alert, NAD< calm, interactive, no dyspnea, pain improved after ibuprofen 2 hrs ago. Wife at [facility].

## 2014-10-28 ENCOUNTER — Ambulatory Visit: Payer: Medicare Other | Admitting: Family

## 2014-10-28 ENCOUNTER — Other Ambulatory Visit: Payer: Medicare Other

## 2014-11-11 DIAGNOSIS — M7542 Impingement syndrome of left shoulder: Secondary | ICD-10-CM | POA: Diagnosis not present

## 2014-11-11 DIAGNOSIS — M75102 Unspecified rotator cuff tear or rupture of left shoulder, not specified as traumatic: Secondary | ICD-10-CM | POA: Diagnosis not present

## 2014-11-11 DIAGNOSIS — M25512 Pain in left shoulder: Secondary | ICD-10-CM | POA: Diagnosis not present

## 2014-11-11 DIAGNOSIS — S4992XA Unspecified injury of left shoulder and upper arm, initial encounter: Secondary | ICD-10-CM | POA: Diagnosis not present

## 2014-11-16 DIAGNOSIS — M25512 Pain in left shoulder: Secondary | ICD-10-CM | POA: Diagnosis not present

## 2014-11-21 DIAGNOSIS — S4992XD Unspecified injury of left shoulder and upper arm, subsequent encounter: Secondary | ICD-10-CM | POA: Diagnosis not present

## 2014-12-03 DIAGNOSIS — S4992XD Unspecified injury of left shoulder and upper arm, subsequent encounter: Secondary | ICD-10-CM | POA: Diagnosis not present

## 2014-12-12 ENCOUNTER — Other Ambulatory Visit: Payer: Self-pay | Admitting: Hematology & Oncology

## 2014-12-13 ENCOUNTER — Ambulatory Visit: Payer: Medicare Other | Attending: Psychology | Admitting: Psychology

## 2014-12-13 DIAGNOSIS — R413 Other amnesia: Secondary | ICD-10-CM

## 2014-12-13 NOTE — Progress Notes (Signed)
[facility]  [address]   Telephone [phone] [address] Fax [phone] [place], [place] [address]  Initial Contact Note  Name:  William Barron Date of Birth; [birth year] MRN:  [id number] Date:  12/13/2014  William Barron is an 69 y.o. male who was referred for neuropsychological evaluation by Arlice Colt, MD of [facility] due to his complaints of reduced concentration and memory coincident to chemotherapy treatment in 2015.    A total of 6 hours was spent today reviewing medical records, interviewing (CPT [phone]) [name] and administering and scoring neurocognitive tests (CPT [phone] & [phone]).  Preliminary Diagnostic Impression: Memory Complaints [R41.3]  There were no concerns expressed or behaviors displayed by [name] that would require immediate attention.   A full report will follow once the planned testing has been completed. His next appointment is scheduled for 12/23/14.   Jamey Ripa, Ph.D Licensed Psychologist 12/13/2014

## 2014-12-15 ENCOUNTER — Other Ambulatory Visit: Payer: Self-pay | Admitting: Hematology & Oncology

## 2014-12-18 DIAGNOSIS — S4992XD Unspecified injury of left shoulder and upper arm, subsequent encounter: Secondary | ICD-10-CM | POA: Diagnosis not present

## 2014-12-18 DIAGNOSIS — M7542 Impingement syndrome of left shoulder: Secondary | ICD-10-CM | POA: Diagnosis not present

## 2014-12-18 DIAGNOSIS — M75102 Unspecified rotator cuff tear or rupture of left shoulder, not specified as traumatic: Secondary | ICD-10-CM | POA: Diagnosis not present

## 2014-12-19 DIAGNOSIS — C9000 Multiple myeloma not having achieved remission: Secondary | ICD-10-CM | POA: Diagnosis not present

## 2014-12-19 DIAGNOSIS — E858 Other amyloidosis: Secondary | ICD-10-CM | POA: Diagnosis not present

## 2014-12-23 ENCOUNTER — Ambulatory Visit: Payer: Medicare Other | Attending: Psychology | Admitting: Psychology

## 2014-12-23 DIAGNOSIS — R4189 Other symptoms and signs involving cognitive functions and awareness: Secondary | ICD-10-CM | POA: Diagnosis not present

## 2014-12-23 NOTE — Progress Notes (Addendum)
[facility]  [address]   Telephone [phone] [address] Fax [phone] [place], [place] [address]   [place]* This report should not be released without the consent of the client  Name:   William Barron Date of Birth:  12/11/2045 [facility] MR#:  [id number] Dates of Evaluation: 12/13/14 & 12/23/14  Reason for Referral Omarr Hann is a 69 year-old right-handed man who was referred for neuropsychological evaluation by Arlice Colt, MD of [facility]. William Barron has reported a history of mild memory difficulties beginning in 2014. In early 2015 he was diagnosed with multiple myeloma with amyloidosis and treated with weekly Velcade along with oral Cytoxan and Decadron. An MRI of the brain on 09/04/2014 showed nonspecific white matter findings that were presumed due to chronic small vessel ischemic changes. Recent lab studies were notable only for low Vitamin D level.  Sources of Information Electronic medical records from the [facility] were reviewed. William Barron and his wife, Ms. Sanjuana Letters, were interviewed.    Chief Complaints & Current Status William Barron reported that about a year or two ago he began to notice having difficulties concentrating and retaining what he had just read. In addition, it was taking him longer than usual to recall names of people he knows, find words while speaking and remember how to get to familiar places while driving. His wife reported that he began to demonstrate cognitive changes in 2014 that have since become more frequent. She gave examples of him not recalling how to get from one location to another while driving in familiar areas and forgetting details of recent conversations. She also described him as having become more prone to procrastinate household and work projects and seeming less decisive than typical.  He did not report any  changes in his mood, vegetative functioning or social situation coincident with his cognitive complaints. Both agreed that his cognitive difficulties began before he was started on chemotherapy agents in mid-2015 to treat multiple myeloma with amyloidosis.  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 He did not report having had any difficulty using household appliances, taking his medications on schedule, paying bills or driving. He has been relying on written notes and a calendar to remember appointments.   He denied feeling depressed or unduly anxious. He described himself as usually "even keeled" and "satisfied". His wife reported he has a longtime tendency to be easily irritated or annoyed, which has intensified within the past six months. His angry moods have continued to dissipate quickly. He has not displayed signs of sad mood or mania. He has not exhibited any changes in personality or habits.   He reported having longtime bimanual tremors that have not changed. His wife stated that he has been walking in a shuffling manner of late. He denied problems with gait, balance, unilateral limb strength, sleep, daytime alertness, vision, speech, appetite or swallowing. He has been wearing hearing aids in both ears since 2014. He did not report being in pain.  Background In addition to multiple myeloma and amyloidosis, his past medical history was notable for hyperlipidemia, hypertension and prostatitis. He denied history of head injury, loss of consciousness, seizure activity, neurological infection or exposure to neurotoxic substances.   He reported a many year habit of drinking two glasses of bourbon every night. He denied history of alcohol abuse or illicit drug use.  He was a cigarette smoker until 03-Jun-1977.     He reported no history of emotional difficulties or mental health contacts.  His current  medications include androgel pump, aspirin, avanafil, tamsulosin, Vitamin D supplements and zolpidem as needed.   Family medical history was notable for his mother with parkinsonism, a sister diagnosed with schizophrenia and a brother with cerebral palsy. He was not aware of any family members having had a memory disorder or dementia .  He lives with his wife of 2 years. Their only son died in 06/03/09 from an accidental drug overdose. He has a another child from his previous marriage that had ended in divorce.  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 He continues to perform legal work though no longer appears in court.    He reported that she earned a Midwife from [employer]. He reported that he graduated in the middle of the class from high school, college and law school. He stated his belief that he is not as intelligent as other persons who have attained his level of education or career achievement. He denied history of any school-based attentional or learning problems.  Evaluation Procedures In addition to review of medical records and clinical interviews, the following tests or questionnaires were administered:  Animal Naming Test [facility] Naming Test Controlled Oral Word Association Test Finger Tapping Test Geriatric Depression Scale (short form) Grooved Pegboard Test Paced Auditory Serial Addition Test Rey Complex Figure: copy Stroop Color and Word Test Trail Making A & B Wechsler Adult Intelligence Scale-IV:   Music therapist, Coding, Digit Span, Matrix Reasoning & Similarities   Wechsler Memory Scale- IV  Wide Range Achievement Test-4: Word Reading [employer] Test  Test Results Validity & Interpretative Considerations It was concluded that with the test results represented a valid measure of his cognitive functioning. He did not display any signs of  physical or emotional distress. He appeared to be consistently attentive and persisted well to task. He did not report or display problems with vision. Despite wearing hearing aids in both ears, there were several occasions in which he had difficulty hearing verbal input.  He displayed subtle to mild bimanual resting hand tremors that did not appear to affect his graphomotor performances. He was able to comprehend task instructions without difficulty. He appeared to expend optimal effort.   His baseline intellectual potential was estimated to fall within the Average range based on demographic factors coupled with a measure of word reading (Wide Range Achievement Test-4) that represents an over-learned verbal skill relatively resistant to the effects of neurological disorder or injury.   His test scores were corrected to reflect norms for his age and, whenever possible, his gender and educational level (i.e., 19 years). A listing of his test scores can be found at the end of this report.   Speed of Processing & Attention His performances on tests that measured speed of information processing were variable. He scored at the lower boundary of the Average range on a test that measured his speed and accuracy in transcribing symbols to match digits using a key (Wechsler Adult Intelligence Scale-IV (WAIS-IV) Coding). His speed to draw lines to connect numbers arrayed on a page in numerical sequence (Trails A) fell at the lower boundary of the Low Average range.  His speed to read words out loud or name color hues (Stroop Color and Word Test) were below  average and subnormal, respectively. In contrast, he performed within the High Average range on a challenging attentional task that required him to sustain attention and hold information in working memory in order to mentally add pairs of digits presented at various rates of speed (Paced Auditory Serial Addition Test).   His performances on untimed attentional tests  that required sustained concentration and working memory capacity were within normal expectations based on his abilities to repeat digit sequences in reverse or ascending order (WAIS-IV Digit Span) or immediately recognize series of symbols in left to right order (Wechsler Memory Scale-IV (WMS-IV) Symbol Span).  Learning & Memory A measure of his ability to immediately recall verbal and visual information (WMS-IV Immediate Memory Index (IMI)), fell at the upper limit of the Average range. He performed equally well on auditory and visual immediate memory tasks. A measure of his ability to recall verbal and visual information after a 20 to 30 minute delay (WMS-IV Delayed Memory Index (DMI)) fell within the Average range. His DMI was as expected given his IMI, which indicated that he retained a normal amount of information after the delay interval.   Executive Functions His performances on tests of higher level attentional, organizational and reasoning processes were variable and mostly below expectations. As noted above, his performance on a task that required visual scanning coupled with simple sequencing (Trails A) fell at the lower boundary of the Low Average range. His speed to perform a more complex test that required visual scanning coupled with ongoing cognitive set shifting (Trails B) was within the Mildly Impaired range. His efficiency to name the print color of a word while simultaneously ignoring the conflicting word (Stroop Color Word Test), which requires selective attention and response inhibition, was within the Low Average range. His relatively slow rate of reading words and naming colors on this test suggested that his performance was most limited by his slow processing speed rather than a problem with complex attention. His ability to fluently generate words to letter prompts (Controlled Oral Word Association Test) was within the Low Average range. His semantic fluency, as measured by his  ability to name members of a category (Animal Naming Test), was within the Average range. Finally, despite performing as expected on tests of abstract reasoning (WAIS-IV Similarities & Matrix Reasoning), he struggled on a test of problem solving ([employer]) that required nonverbal deductive reasoning. This test was discontinued after twenty five trials as he was unable to deduce the initial principle by which to sort geometric figures. Once he was told the principle, however, he thereafter had no problems maintaining set or inferring new sorting concepts in response to feedback that the prior idea was no longer correct.   Language There were no qualitative indications of language difficulties. He scored within the Mildly Impaired range on a test that required him to name drawings of objects to confrontation [facility] [employer]). For example, he could not name drawings of dominoes, a stethoscope or a funnel. As noted above, a measure of phonemic fluency (Controlled Oral Association Test) fell within the Low Average range. His word reading skill (Wide Range Achievement Test-4) fell within the upper end of the Average range.  Visual Perceptual & Visuospatial Construction There were no signs of spatial inattention or problems with visual recognition. His performed within the Average range on a test of visuospatial organization that required assembly of two-dimensional block designs from models Product manager). His copy of a spatially-complex geometric design (Rey Complex Figure) was  nearly flawless.  Motor Functioning He demonstrated consistent right hand preference. No problems with gross motor coordination were observed. His fine motor speed (Finger Tapping Test) was within the Low Average range for his right hand and the Average range for his left hand. His eye-hand dexterity, as assessed by his speed to put grooved pegs into a formboard (Grooved Pegboard Test), was within the  Average range for his right hand and the Low Average range for his left hand. He did not display a pattern suggestive of a lateralized discrepancy between hands.   Emotional Status His score of 4/15 on the Geriatric Depression Scale (short form) was not suggestive of depression. He did endorse a preference to stay at home, having more problems with memory than most and lacking energy. He did not endorse feeling happy most of the time but rather described himself as  "even keeled" and "satisfied".  Summary & Conclusions William Barron is a 69 year-old man who reported a history of mild difficulties with concentration and memory that were first evident sometime in 2014. 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  His neuropsychological profile was notable for mild deficiencies on measures of processing speed, cognitive flexibility and novel problem-solving, which considered together would suggest reduced executive functioning. Specifically, he performed within the Low Average to Mildly Impaired ranges on tests that measured visual scanning efficiency, word reading or color naming speed, fluent word generation or speed to shift cognitive set. Moreover, despite intact abstract reasoning ability he struggled on a problem solving test that required nonverbal deductive reasoning. While there were no qualitative indications of language difficulties, his confrontational naming fell within the Mildly Impaired range. Conversely, he performed within normal expectations on tests of sustained attention/processing speed within the auditory channel, working memory span, Chief of Staff. His fine motor speed/coordination varied from the Low Average to Average ranges without signs of lateral discrepancy.   He did not report experiencing symptoms of affective distress nor other psychiatric symptomology. His wife reported that his  longtime tendency to be easily irritated or annoyed has intensified within the past six months. Otherwise, she has not observed him to have displayed signs of mood disturbance or changes in personality.   The etiology of cognitive difficulties is unclear. The nature of his neuropsychological profile in light of his medical history might suggest a vascular etiology. The adequacy of his memory functioning, including for acquisition, storage and retrieval processes, would argue against prodromal Alzheimer's disease. Chemotherapy induced cognitive dysfunction is unlikely given that subjective cognitive changes were apparent prior to him starting chemotherapy though it is possible that chemotherapy worsened extant cognitive difficulties. Finally, although he denied experiencing symptoms of mood disturbance or stress, his tendency to be easily irritated in context of his recent battle with cancer, transition into semi-retirement and the sudden death of his son five years ago, would lead one to imagine that he could be depressed, which if true might be a cause of diminished cognitive functioning.  The results and conclusions from this evaluation were discussed with William Barron and his wife on 12/23/14. He attributed his low scores on some cognitive tests to his belief that he is not as intelligent as other persons who have attained his level of education or career achievement.   Diagnostic Impression Unspecified mild neurocognitive disorder [R41.9]  Recommendation 1. He was encouraged to resume regular exercise given the benefits of exercise on mood,  brain functioning and overall health.  2. It is recommended that his cognitive functioning be monitored. The current test results comprise a baseline of his cognitive functioning. A repeat neuropsychological evaluation in one to two years (or sooner if clinically indicated) is recommended to track for changes, if any, in his cognitive functioning.   I have  appreciated the opportunity to evaluate William Barron. Please feel free to contact me with any comments or questions.     __________________ Antionette Poles, Ph.D Licensed Psychologist           ADDENDUM-NEUROPSYCHOLOGICAL TEST RESULTS  Animal Naming Test Score= 18 32nd (adjusted for age, gender and educational level)    [place] Naming Test Score= [phone]   7th  (adjusted for age, gender and educational level)    Controlled Oral Word Association Test Score=  30 words/0 repetitions 14th (adjusted for age, gender and educational level)    Finger Tapping R: 45 16th  (adjusted for age, gender and educational level)  L: 5 37th  (adjusted for age, gender and educational level)    Grooved Pegboard R:  84s 32nd  (adjusted for age, gender and educational level)  L: 107s 19th  (adjusted for age, gender and educational level)    Paced Auditory Serial Addition Test Series 1:  45   series 2:  56   Series 3: 33   Series 4: 54   Total=      150 80th  (adjusted for age, race and educational level)    Rey Complex Figure: copy       Score= 35/36  Normal      Stroop Color Word Test  Score Residual % (adjusted for age and educational level)  Word 97 -13 18th     Color 9 -25  2nd     Color-Word 31 -11 14th       Trails A Score=  52s  0e  9th  (adjusted for age, gender and educational level)  Trails B Score= 134s 1e  7th (adjusted for age, gender and educational level)    Wechsler Adult Intelligence Scale-IV   Subtest Scaled Score Percentile  Block Design   9 37th    Similarities 13 84th     Digit Span  Forward               Backward               Sequencing '13 10 11 15 ' 84th        50th     63rd  95rd     Matrix Reasoning 10 50th       Coding     8 25th        Wechsler Memory Scale-IV Older Adult Battery  Index Index Score Percentile  Immediate Memory 110 75th    Auditory Memory 107 68th       Visual Memory 113 81st       Delayed Memory 109 73rd         Symbol Span Scaled score= 14 91st         Wide Range Achievement Test-4 Subtest  Raw score Standard score Percentile  Word Reading 63/70  107 68th      [place] Card Sorting Test     discontinued after he was unable to deduce the  Initial sorting principle after 25 trials

## 2014-12-25 ENCOUNTER — Other Ambulatory Visit: Payer: Self-pay

## 2014-12-25 ENCOUNTER — Other Ambulatory Visit: Payer: Self-pay | Admitting: Hematology & Oncology

## 2015-01-03 ENCOUNTER — Encounter: Payer: Self-pay | Admitting: Psychology

## 2015-01-15 ENCOUNTER — Other Ambulatory Visit: Payer: Self-pay | Admitting: Hematology & Oncology

## 2015-01-15 DIAGNOSIS — G4700 Insomnia, unspecified: Secondary | ICD-10-CM

## 2015-01-17 ENCOUNTER — Ambulatory Visit: Payer: Self-pay | Admitting: Neurology

## 2015-01-20 ENCOUNTER — Telehealth: Payer: Self-pay | Admitting: Hematology & Oncology

## 2015-01-20 DIAGNOSIS — M19012 Primary osteoarthritis, left shoulder: Secondary | ICD-10-CM | POA: Diagnosis not present

## 2015-01-20 DIAGNOSIS — Y999 Unspecified external cause status: Secondary | ICD-10-CM | POA: Diagnosis not present

## 2015-01-20 DIAGNOSIS — Y929 Unspecified place or not applicable: Secondary | ICD-10-CM | POA: Diagnosis not present

## 2015-01-20 DIAGNOSIS — M7542 Impingement syndrome of left shoulder: Secondary | ICD-10-CM | POA: Diagnosis not present

## 2015-01-20 DIAGNOSIS — S43432A Superior glenoid labrum lesion of left shoulder, initial encounter: Secondary | ICD-10-CM | POA: Diagnosis not present

## 2015-01-20 DIAGNOSIS — S46202A Unspecified injury of muscle, fascia and tendon of other parts of biceps, left arm, initial encounter: Secondary | ICD-10-CM | POA: Diagnosis not present

## 2015-01-20 DIAGNOSIS — G8918 Other acute postprocedural pain: Secondary | ICD-10-CM | POA: Diagnosis not present

## 2015-01-20 DIAGNOSIS — S46112A Strain of muscle, fascia and tendon of long head of biceps, left arm, initial encounter: Secondary | ICD-10-CM | POA: Diagnosis not present

## 2015-01-20 NOTE — Telephone Encounter (Signed)
Patient called and cx 01/21/15 and resch for 02/19/15

## 2015-01-21 ENCOUNTER — Encounter: Payer: Self-pay | Admitting: Neurology

## 2015-01-21 ENCOUNTER — Ambulatory Visit: Payer: Self-pay | Admitting: Hematology & Oncology

## 2015-01-21 ENCOUNTER — Other Ambulatory Visit: Payer: Self-pay

## 2015-01-23 DIAGNOSIS — S43432D Superior glenoid labrum lesion of left shoulder, subsequent encounter: Secondary | ICD-10-CM | POA: Diagnosis not present

## 2015-01-27 DIAGNOSIS — S43432D Superior glenoid labrum lesion of left shoulder, subsequent encounter: Secondary | ICD-10-CM | POA: Diagnosis not present

## 2015-02-03 DIAGNOSIS — S43432D Superior glenoid labrum lesion of left shoulder, subsequent encounter: Secondary | ICD-10-CM | POA: Diagnosis not present

## 2015-02-04 DIAGNOSIS — L600 Ingrowing nail: Secondary | ICD-10-CM | POA: Diagnosis not present

## 2015-02-04 DIAGNOSIS — Z4789 Encounter for other orthopedic aftercare: Secondary | ICD-10-CM | POA: Diagnosis not present

## 2015-02-04 DIAGNOSIS — B351 Tinea unguium: Secondary | ICD-10-CM | POA: Diagnosis not present

## 2015-02-04 DIAGNOSIS — M79674 Pain in right toe(s): Secondary | ICD-10-CM | POA: Diagnosis not present

## 2015-02-05 ENCOUNTER — Encounter: Payer: Self-pay | Admitting: Podiatry

## 2015-02-05 ENCOUNTER — Ambulatory Visit (INDEPENDENT_AMBULATORY_CARE_PROVIDER_SITE_OTHER): Payer: Medicare Other | Admitting: Podiatry

## 2015-02-05 VITALS — BP 108/76 | HR 62 | Resp 16

## 2015-02-05 DIAGNOSIS — L600 Ingrowing nail: Secondary | ICD-10-CM | POA: Diagnosis not present

## 2015-02-05 DIAGNOSIS — S43432D Superior glenoid labrum lesion of left shoulder, subsequent encounter: Secondary | ICD-10-CM | POA: Diagnosis not present

## 2015-02-05 NOTE — Patient Instructions (Signed)

## 2015-02-06 NOTE — Progress Notes (Signed)
Subjective:     Patient ID: William Barron, male   DOB: June 29, 1945, 69 y.o.   MRN: [id number]  HPI patient states my right big toe is ingrown on both sides and I cannot cut it myself and it is very sore. My ankle is doing better when I had the torn tendon   Review of Systems     Objective:   Physical Exam Neurovascular status intact muscle strength adequate with continued severe depression of the arch right and incurvated right hallux medial lateral border that's painful when pressed    Assessment:     Ingrown toenail deformity right hallux with posterior tibial dysfunction right that has responded well to ankle foot orthotic    Plan:     Continue to encourage usage of the device and discussed ingrown toenail correction and risk. Patient wants surgery and at this time I explained risk to him and procedure and he wants it done and I infiltrated 60 mg Xylocaine Marcaine mixture remove the medial and lateral border of the big toe right and apply chemical consisting of phenol 3 applications 30 seconds into each border followed by alcohol lavaged and sterile dressing. Gave instructions on soaks and reappoint

## 2015-02-10 DIAGNOSIS — S43432D Superior glenoid labrum lesion of left shoulder, subsequent encounter: Secondary | ICD-10-CM | POA: Diagnosis not present

## 2015-02-11 ENCOUNTER — Telehealth: Payer: Self-pay | Admitting: *Deleted

## 2015-02-11 NOTE — Telephone Encounter (Signed)
Called patient at [phone] (Cell #) to check to see how they were doing from their ingrown toenail procedure that was performed on Wednesday, February 05, 2015. Pt stated, "Toe is healing nicely, but throbbed for a day or two and is feeling better".

## 2015-02-13 DIAGNOSIS — S43432D Superior glenoid labrum lesion of left shoulder, subsequent encounter: Secondary | ICD-10-CM | POA: Diagnosis not present

## 2015-02-17 DIAGNOSIS — S43432D Superior glenoid labrum lesion of left shoulder, subsequent encounter: Secondary | ICD-10-CM | POA: Diagnosis not present

## 2015-02-19 ENCOUNTER — Other Ambulatory Visit: Payer: BLUE CROSS/BLUE SHIELD

## 2015-02-19 ENCOUNTER — Ambulatory Visit: Payer: Self-pay | Admitting: Hematology & Oncology

## 2015-02-19 ENCOUNTER — Other Ambulatory Visit: Payer: Self-pay

## 2015-02-20 DIAGNOSIS — S43432D Superior glenoid labrum lesion of left shoulder, subsequent encounter: Secondary | ICD-10-CM | POA: Diagnosis not present

## 2015-02-25 DIAGNOSIS — S43432D Superior glenoid labrum lesion of left shoulder, subsequent encounter: Secondary | ICD-10-CM | POA: Diagnosis not present

## 2015-02-26 ENCOUNTER — Telehealth: Payer: Self-pay | Admitting: Cardiology

## 2015-02-26 NOTE — Telephone Encounter (Signed)
Received records from [facility] for appointment on 02/27/15 with Dr Percival Spanish.  Records given to [facility] (medical records) for Dr Hochrein's schedule on 02/27/15. lp

## 2015-02-27 ENCOUNTER — Ambulatory Visit (INDEPENDENT_AMBULATORY_CARE_PROVIDER_SITE_OTHER): Payer: Medicare Other | Admitting: Cardiology

## 2015-02-27 ENCOUNTER — Encounter: Payer: Self-pay | Admitting: Cardiology

## 2015-02-27 VITALS — BP 114/70 | HR 73 | Ht 66.0 in | Wt 162.4 lb

## 2015-02-27 DIAGNOSIS — R5382 Chronic fatigue, unspecified: Secondary | ICD-10-CM

## 2015-02-27 DIAGNOSIS — L7211 Pilar cyst: Secondary | ICD-10-CM | POA: Diagnosis not present

## 2015-02-27 DIAGNOSIS — L812 Freckles: Secondary | ICD-10-CM | POA: Diagnosis not present

## 2015-02-27 DIAGNOSIS — L03011 Cellulitis of right finger: Secondary | ICD-10-CM | POA: Diagnosis not present

## 2015-02-27 DIAGNOSIS — S43432D Superior glenoid labrum lesion of left shoulder, subsequent encounter: Secondary | ICD-10-CM | POA: Diagnosis not present

## 2015-02-27 DIAGNOSIS — Z85820 Personal history of malignant melanoma of skin: Secondary | ICD-10-CM | POA: Diagnosis not present

## 2015-02-27 DIAGNOSIS — D1801 Hemangioma of skin and subcutaneous tissue: Secondary | ICD-10-CM | POA: Diagnosis not present

## 2015-02-27 DIAGNOSIS — L03031 Cellulitis of right toe: Secondary | ICD-10-CM | POA: Diagnosis not present

## 2015-02-27 DIAGNOSIS — R5383 Other fatigue: Secondary | ICD-10-CM | POA: Insufficient documentation

## 2015-02-27 DIAGNOSIS — R0602 Shortness of breath: Secondary | ICD-10-CM | POA: Diagnosis not present

## 2015-02-27 DIAGNOSIS — L821 Other seborrheic keratosis: Secondary | ICD-10-CM | POA: Diagnosis not present

## 2015-02-27 LAB — CBC
HCT: 39.5 % (ref 39.0–52.0)
Hemoglobin: 13.2 g/dL (ref 13.0–17.0)
MCH: 31.7 pg (ref 26.0–34.0)
MCHC: 33.4 g/dL (ref 30.0–36.0)
MCV: 94.7 fL (ref 78.0–100.0)
MPV: 10.2 fL (ref 8.6–12.4)
Platelets: 264 10*3/uL (ref 150–400)
RBC: 4.17 MIL/uL — ABNORMAL LOW (ref 4.22–5.81)
RDW: 13.8 % (ref 11.5–15.5)
WBC: 5.7 10*3/uL (ref 4.0–10.5)

## 2015-02-27 LAB — TSH: TSH: 1.604 u[IU]/mL (ref 0.350–4.500)

## 2015-02-27 NOTE — Patient Instructions (Signed)
Your physician recommends that you schedule a follow-up appointment in: As Needed  Your physician has requested that you have an echocardiogram. Echocardiography is a painless test that uses sound waves to create images of your heart. It provides your doctor with information about the size and shape of your heart and how well your heart's chambers and valves are working. This procedure takes approximately one hour. There are no restrictions for this procedure.  Your physician has requested that you have an exercise tolerance test. For further information please visit [email or web address]. Please also follow instruction sheet, as given.  Your physician has recommended you make the following change in your medication: CBC, TSH and BNP  Merry Christmas and [place]

## 2015-02-27 NOTE — Progress Notes (Signed)
Cardiology Office Note   Date:  02/27/2015   ID:  William Barron, DOB May 22, 1945, MRN [id number]  PCP:  [name], MD  Cardiologist:   Minus Breeding, MD   Chief Complaint  Patient presents with  . Fatigue      History of Present Illness: William Barron is a 69 y.o. male who presents for evaluation of fatigue. Is a family history of coronary disease.   However, he himself has not had any prior cardiac history. He has had an extensive medical history dating back to 2014. 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 He was subsequently found to have A light chain multiple myeloma. Treated at [facility] with Cytoxan Velcade and Decadron.   I was able to review extensive records. She had an MRI done in 2000 and 15 in April that demonstrated a preserved ejection fraction of 55%. There were no other significant abnormalities. He was no suggestion of amyloid heart. He's had echocardiograms most recent in October 2015 with EF of 57%. There was a question of moderate to severe pulmonary regurgitation.     The patients MM  is currently smoldering and not requiring active treatment. It was decided it [facility] the [facility] that he did not need stem cell transplant. He was followed up with PET scan which I had not been able to review and there was no evidence of activity a no evidence of ongoing amyloid apparently. However, he's had progressive fatigue. His wife reports about a 14 pound weight loss. His voice is more strained and fatigue. He's been weak. He does have some shortness of breath climbing stairs. He's not describing PND or orthopnea. He's not having chest pressure, neck or arm discomfort. Is not describing palpitations. No night sweats. He's had some hot flashes because he thinks of his recently restarted androgel.   Past Medical History  Diagnosis Date  . Hyperlipidemia   . Prostatitis     acute and chronic  . Nephrolithiasis   . Cancer ([place]) 2016   amylodosis, multiple myeloma    Past Surgical History  Procedure Laterality Date  . Rotator cuff repair    . Appendectomy    . Elbow surgery  2003    right  . Colonoscopy  04/07/00  . Hernia repair       Current Outpatient Prescriptions  Medication Sig Dispense Refill  . ANDROGEL PUMP 20.25 MG/ACT (1.62%) GEL Apply topically every morning.     Marland Kitchen aspirin EC 81 MG tablet 81 mg daily.     . minoxidil (ROGAINE) 2 % external solution Apply 1 application topically daily.     Marland Kitchen STENDRA 200 MG TABS     . zolpidem (AMBIEN) 5 MG tablet TAKE 1/2 TO 1 TABLET BY MOUTH AT BEDTIME AS NEEDED FOR SLEEP 30 tablet 2   No current facility-administered medications for this visit.    Allergies:   Penicillins    Social History:  The patient  reports that he quit smoking about 37 years ago. He has never used smokeless tobacco. He reports that he drinks alcohol. He reports that he does not use illicit drugs.   Family History:  The patient's  family history includes CAD (age of onset: 66) in his brother; Pancreatic cancer in his father; Parkinsonism in his mother.    ROS:  Please see the history of present illness.   Otherwise, review of systems are positive for mild memory impairment noted on PCP records.  All other systems are reviewed and negative.    PHYSICAL EXAM: VS:  BP 114/70 mmHg  Pulse 73  Ht _0  (1.676 m)  Wt 162 lb 6.4 oz (73.664 kg)  BMI 26.22 kg/m2 , BMI Body mass index is 26.22 kg/(m^2). GENERAL:  Well appearing HEENT:  Pupils equal round and reactive, fundi not visualized, oral mucosa unremarkable NECK:  No jugular venous distention, waveform within normal limits, carotid upstroke brisk and symmetric, no bruits, no thyromegaly LYMPHATICS:  No cervical, inguinal adenopathy LUNGS:  Clear to auscultation bilaterally BACK:  No CVA tenderness CHEST:  Unremarkable HEART:  PMI not displaced or sustained,S1 and S2 within normal limits, no S3, no S4, no clicks, no rubs, no  murmurs ABD:  Flat, positive bowel sounds normal in frequency in pitch, no bruits, no rebound, no guarding, no midline pulsatile mass, no hepatomegaly, no splenomegaly EXT:  2 plus pulses throughout, no edema, no cyanosis no clubbing SKIN:  No rashes no nodules NEURO:  Cranial nerves II through XII grossly intact, motor grossly intact throughout PSYCH:  Cognitively intact, oriented to person place and time    EKG:  EKG is ordered today. The ekg ordered today demonstrates  Sinus rhythm, rate 73 axis within normal limits, intervals within normal limits, poor anterior R-wave progression, cannot exclude old anteroseptal infarct   Recent Labs: 07/29/2014: ALT(SGPT) 20 10/15/2014: BUN 26*; Creatinine, Ser 1.20; Hemoglobin 16.7; Platelets 265; Potassium 4.0; Sodium 140    Lipid Panel    Component Value Date/Time   CHOL 189 03/29/2011 1448   TRIG 87.0 03/29/2011 1448   HDL 45.30 03/29/2011 1448   CHOLHDL 4 03/29/2011 1448   VLDL 17.4 03/29/2011 1448   LDLCALC 126* 03/29/2011 1448      Wt Readings from Last 3 Encounters:  02/27/15 162 lb 6.4 oz (73.664 kg)  10/20/14 156 lb (70.761 kg)  10/20/14 156 lb (70.761 kg)      Other studies Reviewed: Additional studies/ records that were reviewed today include: [name] records, . Review of the above records demonstrates:  Please see elsewhere in the note.     ASSESSMENT AND PLAN:  FATIGUE:  Im going to start with a TSH and CBC.  I think the last testing was August.  I will correspond with Dr. Marin Olp as well.  DYPSNEA:  I will check a BNP and POET (Plain Old Exercise Treadmill).    I have a low suspicion of obstructive coronary disease as an etiology however.  PULMONIC REGURGITATION:   This was noted on a previous echo and I will follow-up with an echo as well.   Current medicines are reviewed at length with the patient today.  The patient does not have concerns regarding medicines.  The following changes have been made:  no  change  Labs/ tests ordered today include:   Orders Placed This Encounter  Procedures  . B Nat Peptide  . TSH  . CBC  . Exercise Tolerance Test  . EKG 12-Lead  . ECHOCARDIOGRAM COMPLETE     Disposition:   FU with me as needed.      Signed, Minus Breeding, MD  02/27/2015 10:50 AM    [place]

## 2015-02-28 LAB — BRAIN NATRIURETIC PEPTIDE: Brain Natriuretic Peptide: 24.8 pg/mL (ref 0.0–100.0)

## 2015-03-06 DIAGNOSIS — S43432D Superior glenoid labrum lesion of left shoulder, subsequent encounter: Secondary | ICD-10-CM | POA: Diagnosis not present

## 2015-03-12 DIAGNOSIS — S43432D Superior glenoid labrum lesion of left shoulder, subsequent encounter: Secondary | ICD-10-CM | POA: Diagnosis not present

## 2015-03-12 DIAGNOSIS — Z4789 Encounter for other orthopedic aftercare: Secondary | ICD-10-CM | POA: Diagnosis not present

## 2015-03-14 ENCOUNTER — Other Ambulatory Visit: Payer: Self-pay

## 2015-03-14 ENCOUNTER — Ambulatory Visit (HOSPITAL_COMMUNITY): Payer: Medicare Other | Attending: Cardiology

## 2015-03-14 DIAGNOSIS — E785 Hyperlipidemia, unspecified: Secondary | ICD-10-CM | POA: Insufficient documentation

## 2015-03-14 DIAGNOSIS — I517 Cardiomegaly: Secondary | ICD-10-CM | POA: Insufficient documentation

## 2015-03-14 DIAGNOSIS — R5382 Chronic fatigue, unspecified: Secondary | ICD-10-CM

## 2015-03-14 DIAGNOSIS — I351 Nonrheumatic aortic (valve) insufficiency: Secondary | ICD-10-CM | POA: Insufficient documentation

## 2015-03-14 DIAGNOSIS — I313 Pericardial effusion (noninflammatory): Secondary | ICD-10-CM | POA: Insufficient documentation

## 2015-03-14 DIAGNOSIS — I10 Essential (primary) hypertension: Secondary | ICD-10-CM | POA: Diagnosis not present

## 2015-03-14 DIAGNOSIS — I371 Nonrheumatic pulmonary valve insufficiency: Secondary | ICD-10-CM | POA: Diagnosis not present

## 2015-03-14 DIAGNOSIS — R0602 Shortness of breath: Secondary | ICD-10-CM | POA: Diagnosis not present

## 2015-03-20 ENCOUNTER — Telehealth (HOSPITAL_COMMUNITY): Payer: Self-pay

## 2015-03-20 NOTE — Telephone Encounter (Signed)
Encounter complete. 

## 2015-03-21 ENCOUNTER — Ambulatory Visit (INDEPENDENT_AMBULATORY_CARE_PROVIDER_SITE_OTHER): Payer: Medicare Other | Admitting: Podiatry

## 2015-03-21 ENCOUNTER — Encounter: Payer: Self-pay | Admitting: Podiatry

## 2015-03-21 VITALS — BP 126/70 | HR 68 | Resp 12

## 2015-03-21 DIAGNOSIS — L03031 Cellulitis of right toe: Secondary | ICD-10-CM

## 2015-03-21 DIAGNOSIS — L600 Ingrowing nail: Secondary | ICD-10-CM | POA: Diagnosis not present

## 2015-03-21 MED ORDER — CEPHALEXIN 500 MG PO CAPS: 500.0000 mg | ORAL_CAPSULE | Freq: Two times a day (BID) | ORAL | Status: DC

## 2015-03-21 NOTE — Progress Notes (Signed)
Subjective:     Patient ID: William Barron, male   DOB: [birth year], 70 y.o.   MRN: [id number]  HPI this patient presents to the office with chief complaint of a painful infected toenail outside border big toe, right foot. He states that he had surgery performed 6 weeks ago and the toe has now become infected. He states the nail has become painful as he walks and wears his shoes. He presents the office for definitive evaluation and treatment of this condition   Review of Systems     Objective:   Physical Exam GENERAL APPEARANCE: Alert, conversant. Appropriately groomed. No acute distress.  VASCULAR: Pedal pulses palpable at  [facility] and PT bilateral.  Capillary refill time is immediate to all digits,  Normal temperature gradient.  Digital hair growth is present bilateral  NEUROLOGIC: sensation is normal to 5.07 monofilament at 5/5 sites bilateral.  Light touch is intact bilateral, Muscle strength normal.  MUSCULOSKELETAL: acceptable muscle strength, tone and stability bilateral.  Intrinsic muscluature intact bilateral.  Rectus appearance of foot and digits noted bilateral.  PTTD right foot.   DERMATOLOGIC: skin color, texture, and turgor are within normal limits.  No preulcerative lesions or ulcers  are seen, no interdigital maceration noted.  No open lesions present.  . No drainage noted. NAILS  Red swooen nail border lateral border left foot with granulation tissue noted proximal nail fold.     Assessment:     Paronychia right hallux.     Plan:     ROV  Nail surgery lateral border right hallux. Treatment options and alternatives discussed.  Recommended permanent phenol matrixectomy and patient agreed.  Right hallux  was prepped with alcohol and a toe block of 3cc of 2% lidocaine plain was administered in a digital toe block. .  The toe was then prepped with betadine solution .  The offending nail border was then excised and matrix tissue exposed.  Phenol was then applied to the matrix tissue  followed by an alcohol wash.  Antibiotic ointment and a dry sterile dressing was applied.  The patient was dispensed instructions for aftercare.  RTC prn.  Prescibed cephalexin 500 mg. # 15.    Gardiner Barefoot DPM

## 2015-03-25 ENCOUNTER — Encounter (HOSPITAL_COMMUNITY): Payer: Self-pay | Admitting: *Deleted

## 2015-03-25 ENCOUNTER — Ambulatory Visit (HOSPITAL_COMMUNITY)
Admission: RE | Admit: 2015-03-25 | Discharge: 2015-03-25 | Disposition: A | Discharge status: Home / Self Care | Payer: Medicare Other | Source: Ambulatory Visit | Attending: Cardiology | Admitting: Cardiology

## 2015-03-25 DIAGNOSIS — R9439 Abnormal result of other cardiovascular function study: Secondary | ICD-10-CM | POA: Insufficient documentation

## 2015-03-25 DIAGNOSIS — R0602 Shortness of breath: Secondary | ICD-10-CM

## 2015-03-25 DIAGNOSIS — R5382 Chronic fatigue, unspecified: Secondary | ICD-10-CM | POA: Diagnosis not present

## 2015-03-25 LAB — EXERCISE TOLERANCE TEST
Estimated workload: 7.9 METS
Exercise duration (min): 6 min
Exercise duration (sec): 36 s
MPHR: 149 {beats}/min
Peak HR: 148 {beats}/min
Percent HR: 99 %
RPE: 17
Rest HR: 66 {beats}/min

## 2015-03-25 NOTE — Progress Notes (Signed)
Dr Ellyn Hack spoke to patient regarding his ETT results. Ok per Dr Ellyn Hack to d/c pt home. Pt will be scheduled to see a PA this Friday, 01.13.17. Delight Hoh A

## 2015-03-26 ENCOUNTER — Ambulatory Visit: Payer: Medicare Other | Admitting: Podiatry

## 2015-03-26 DIAGNOSIS — C9000 Multiple myeloma not having achieved remission: Secondary | ICD-10-CM | POA: Diagnosis not present

## 2015-03-26 DIAGNOSIS — E858 Other amyloidosis: Secondary | ICD-10-CM | POA: Diagnosis not present

## 2015-03-27 ENCOUNTER — Telehealth: Payer: Self-pay

## 2015-03-27 DIAGNOSIS — R9430 Abnormal result of cardiovascular function study, unspecified: Secondary | ICD-10-CM

## 2015-03-27 NOTE — Telephone Encounter (Signed)
Pt walked into office today wanting to setup test he discussed with Dr. Percival Spanish yesterday.   Orders put in for pt to have stress dobutamine echo, and cancelled appt with PA for tomorrow.  Pt aware of all results.

## 2015-03-27 NOTE — Telephone Encounter (Signed)
-----   Message from Minus Breeding, MD sent at 03/26/2015  5:25 PM EST ----- I spoke with the patient.  Given the fact that the pretest probability of CAD was low, the patient had no chest pain, and it was a late positive test and the fact that he had a false positive POET (Plain Old Exercise Treadmill) per his report in the past the next test will be a dobutamine echo.  Please call him to schedule and send this result to [name], MD

## 2015-03-28 ENCOUNTER — Ambulatory Visit: Payer: Self-pay | Admitting: Cardiology

## 2015-03-28 DIAGNOSIS — S43432D Superior glenoid labrum lesion of left shoulder, subsequent encounter: Secondary | ICD-10-CM | POA: Diagnosis not present

## 2015-03-31 DIAGNOSIS — S43432D Superior glenoid labrum lesion of left shoulder, subsequent encounter: Secondary | ICD-10-CM | POA: Diagnosis not present

## 2015-04-03 ENCOUNTER — Encounter: Payer: Self-pay | Admitting: Hematology & Oncology

## 2015-04-03 ENCOUNTER — Ambulatory Visit (HOSPITAL_BASED_OUTPATIENT_CLINIC_OR_DEPARTMENT_OTHER): Payer: Medicare Other | Admitting: Hematology & Oncology

## 2015-04-03 ENCOUNTER — Other Ambulatory Visit (HOSPITAL_BASED_OUTPATIENT_CLINIC_OR_DEPARTMENT_OTHER): Payer: Medicare Other

## 2015-04-03 VITALS — BP 130/74 | HR 75 | Temp 97.2°F | Resp 16 | Ht 66.0 in | Wt 162.0 lb

## 2015-04-03 DIAGNOSIS — E859 Amyloidosis, unspecified: Secondary | ICD-10-CM

## 2015-04-03 DIAGNOSIS — C9001 Multiple myeloma in remission: Secondary | ICD-10-CM | POA: Diagnosis not present

## 2015-04-03 DIAGNOSIS — E291 Testicular hypofunction: Secondary | ICD-10-CM | POA: Diagnosis not present

## 2015-04-03 LAB — CBC WITH DIFFERENTIAL (CANCER CENTER ONLY)
BASO#: 0.1 10*3/uL (ref 0.0–0.2)
BASO%: 1 % (ref 0.0–2.0)
EOS%: 4.8 % (ref 0.0–7.0)
Eosinophils Absolute: 0.3 10*3/uL (ref 0.0–0.5)
HCT: 44.4 % (ref 38.7–49.9)
HGB: 14.8 g/dL (ref 13.0–17.1)
LYMPH#: 1.7 10*3/uL (ref 0.9–3.3)
LYMPH%: 25.1 % (ref 14.0–48.0)
MCH: 32.2 pg (ref 28.0–33.4)
MCHC: 33.3 g/dL (ref 32.0–35.9)
MCV: 97 fL (ref 82–98)
MONO#: 0.5 10*3/uL (ref 0.1–0.9)
MONO%: 7.6 % (ref 0.0–13.0)
NEUT#: 4.1 10*3/uL (ref 1.5–6.5)
NEUT%: 61.5 % (ref 40.0–80.0)
Platelets: 281 10*3/uL (ref 145–400)
RBC: 4.6 10*6/uL (ref 4.20–5.70)
RDW: 14.3 % (ref 11.1–15.7)
WBC: 6.7 10*3/uL (ref 4.0–10.0)

## 2015-04-03 NOTE — Progress Notes (Signed)
Hematology and Oncology Follow Up Visit  William Barron [id number] 1945/06/12 70 y.o. 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  Barron is in for followup. He now is being followed at [facility]. He sees William Barron every 3 months. He just saw her about a week or so ago. From her point of view, everything looked okay. He says that they did some type of cardiac tests. It looks like you may have had an echocardiogram. I do not see any results from that.  He had lab work done. His Kappa Lightchain was 3.52 mg/dL. His IgA level was 650 mg/dL. He had an M spike of 0.42 g/dL  He recently had surgery for his left rotator cuff.. Everything went well. He is still doing some therapy for his shoulder.  He is now working part-time. He is enjoying this. He is able to travel with his wife.  He's had no fever. He's had no problems infections. He's had no change in bowel or bladder habits. He's had no cough. He's had no rashes. He's had no leg swelling. He's had no swallowing difficulties.  Overall, his performance status is ECOG is 0.   Medications:  Current outpatient prescriptions:  .  ANDROGEL PUMP 20.25 MG/ACT (1.62%) GEL, Apply topically every morning. , Disp: , Rfl:  .  aspirin EC 81 MG tablet, 81 mg daily. , Disp: , Rfl:  .  minoxidil (ROGAINE) 2 % external solution, Apply 1 application topically daily. , Disp: , Rfl:  .  STENDRA 200 MG TABS, , Disp: , Rfl:  .  zolpidem (AMBIEN) 5 MG tablet, TAKE 1/2 TO 1 TABLET BY MOUTH AT BEDTIME AS NEEDED FOR SLEEP, Disp: 30 tablet, Rfl: 2  Allergies:  Allergies  Allergen Reactions  . Penicillins Swelling and Rash    Past Medical History, Surgical history, Social history, and Family History were reviewed and updated.  Review of Systems: As above  Physical Exam:  height is 5\' 6"  (1.676 m) and weight is 162 lb (73.483 kg). His  oral temperature is 97.2 F (36.2 C). His blood pressure is 130/74 and his pulse is 75. His respiration is 16.   Well-developed well-nourished white gentleman. Head and neck exam shows no ocular or oral lesions. Has no palpable cervical or supraclavicular lymph nodes. Lungs are clear. Cardiac exam regular rate and rhythm with no murmurs rubs or bruits. Abdomen is soft. He has good bowel sounds. There is no fluid wave. Is no palpable liver or spleen tip. Extremities shows no clubbing, cyanosis or edema. He has good strength. He has good range of motion of his joints. Skin exam shows no rashes, ecchymosis or petechia. Neurological exam shows no focal neurological deficits.  Lab Results  Component Value Date   WBC 6.7 04/03/2015   HGB 14.8 04/03/2015   HCT 44.4 04/03/2015   MCV 97 04/03/2015   PLT 281 04/03/2015     Chemistry      Component Value Date/Time   NA 140 10/15/2014 0641   NA 141 07/29/2014 1404   NA 141 04/17/2013 1044   K 4.0 10/15/2014 0641   K 3.5 07/29/2014 1404   K 4.1 04/17/2013 1044   CL 104 10/15/2014 0641   CL 102 07/29/2014 1404   CO2 30 07/29/2014 1404   CO2 25 03/30/2014 1035   CO2 28 04/17/2013 1044   BUN 26* 10/15/2014  0641   BUN 16 07/29/2014 1404   BUN 17.3 04/17/2013 1044   CREATININE 1.20 10/15/2014 0641   CREATININE 1.4* 07/29/2014 1404   CREATININE 1.1 04/17/2013 1044      Component Value Date/Time   CALCIUM 9.4 07/29/2014 1404   CALCIUM 9.2 03/30/2014 1035   CALCIUM 9.8 04/17/2013 1044   ALKPHOS 79 07/29/2014 1404   ALKPHOS 74 11/20/2013 1159   ALKPHOS 89 04/17/2013 1044   AST 20 07/29/2014 1404   AST 13 11/20/2013 1159   AST 16 04/17/2013 1044   ALT 20 07/29/2014 1404   ALT 12 11/20/2013 1159   ALT 17 04/17/2013 1044   BILITOT 0.80 07/29/2014 1404   BILITOT 0.5 11/20/2013 1159   BILITOT 0.45 04/17/2013 1044        Impression and Plan: William Barron is 70 year old gentleman with Kappa light chain amyloidosis/IgA Kappa smoldering  myeloma. He was treated with Cytoxan/Velcade/Decadron. He had a good response.   He is off all therapy now. He is just being followed closely.  Since he is being followed at [facility] every 3 months, I just don't see that we have to see him back right now area and we really are not offering anything different for him. Again, William Barron is doing a great job with him and I think they really will let us know if we need to do anything.  William Barron is okay with William Barron following him. He says that he always knows that he come back to see [place] at any time if there are any issues.  I'm just glad that there is no evidence of any progression of his disease. At some point, I would think that there will probably will be some progression but at that point, I'm sure therapy will totally be different than what we use now.   As always, I spent about 35 minutes with him.     Volanda Napoleon, MD 1/19/20175:40 PM

## 2015-04-04 ENCOUNTER — Encounter: Payer: Self-pay | Admitting: Podiatry

## 2015-04-04 ENCOUNTER — Ambulatory Visit (INDEPENDENT_AMBULATORY_CARE_PROVIDER_SITE_OTHER): Payer: Medicare Other | Admitting: Podiatry

## 2015-04-04 VITALS — BP 125/54 | HR 78 | Resp 14

## 2015-04-04 DIAGNOSIS — S43432D Superior glenoid labrum lesion of left shoulder, subsequent encounter: Secondary | ICD-10-CM | POA: Diagnosis not present

## 2015-04-04 DIAGNOSIS — Z09 Encounter for follow-up examination after completed treatment for conditions other than malignant neoplasm: Secondary | ICD-10-CM

## 2015-04-04 LAB — COMPREHENSIVE METABOLIC PANEL (CC13)
ALT: 14 IU/L (ref 0–44)
AST (SGOT): 16 IU/L (ref 0–40)
Albumin, Serum: 4.2 g/dL (ref 3.5–4.8)
Albumin/Globulin Ratio: 1.4 (ref 1.1–2.5)
Alkaline Phosphatase, S: 80 IU/L (ref 39–117)
BUN/Creatinine Ratio: 20 (ref 10–22)
BUN: 18 mg/dL (ref 8–27)
Bilirubin Total: 0.3 mg/dL (ref 0.0–1.2)
Calcium, Ser: 9.4 mg/dL (ref 8.6–10.2)
Carbon Dioxide, Total: 23 mmol/L (ref 18–29)
Chloride, Ser: 106 mmol/L (ref 96–106)
Creatinine, Ser: 0.88 mg/dL (ref 0.76–1.27)
GFR calc Af Amer: 100 mL/min/{1.73_m2} (ref >59)
GFR calc non Af Amer: 86 mL/min/{1.73_m2} (ref >59)
Globulin, Total: 2.9 g/dL (ref 1.5–4.5)
Glucose: 104 mg/dL — ABNORMAL HIGH (ref 65–99)
Potassium, Ser: 3.8 mmol/L (ref 3.5–5.2)
Sodium: 139 mmol/L (ref 134–144)
Total Protein: 7.1 g/dL (ref 6.0–8.5)

## 2015-04-04 LAB — KAPPA/LAMBDA LIGHT CHAINS
Ig Kappa Free Light Chain: 34.93 mg/L — ABNORMAL HIGH (ref 3.30–19.40)
Ig Lambda Free Light Chain: 8.49 mg/L (ref 5.71–26.30)
Kappa/Lambda FluidC Ratio: 4.11 — ABNORMAL HIGH (ref 0.26–1.65)

## 2015-04-04 LAB — IGG, IGA, IGM
IgA, Qn, Serum: 701 mg/dL — ABNORMAL HIGH (ref 61–437)
IgG, Qn, Serum: 416 mg/dL — ABNORMAL LOW (ref 700–1600)
IgM, Qn, Serum: 17 mg/dL (ref 15–143)

## 2015-04-04 NOTE — Progress Notes (Signed)
Patient ID: DEON HACKING, male   DOB: Jul 24, 1945, 70 y.o.   MRN: [id number] This patient returns to the office following nail surgery one week ago.  The patient says toe has been soaked and bandaged as directed.  There has been improvement of the toe since the surgery has been performed. The patient presents for continued evaluation and treatment.  GENERAL APPEARANCE: Alert, conversant. Appropriately groomed. No acute distress.  VASCULAR: Pedal pulses palpable at  [facility] and PT bilateral.  Capillary refill time is immediate to all digits,  Normal temperature gradient.    NEUROLOGIC: sensation is normal to 5.07 monofilament at 5/5 sites bilateral.  Light touch is intact bilateral, Muscle strength normal.  MUSCULOSKELETAL: acceptable muscle strength, tone and stability bilateral.  Intrinsic muscluature intact bilateral.  Rectus appearance of foot and digits noted bilateral.   DERMATOLOGIC: skin color, texture, and turgor are within normal limits.  No preulcerative lesions or ulcers  are seen, no interdigital maceration noted.   NAILS  There is necrotic tissue along the nail groove  In the absence of redness swelling and pain.  DX  S/p nail surgery  ROV  Home instructions were discussed.  Patient to call the office if there are any questions or concerns.   Gardiner Barefoot DPM

## 2015-04-07 DIAGNOSIS — N138 Other obstructive and reflux uropathy: Secondary | ICD-10-CM | POA: Diagnosis not present

## 2015-04-07 DIAGNOSIS — N5201 Erectile dysfunction due to arterial insufficiency: Secondary | ICD-10-CM | POA: Diagnosis not present

## 2015-04-07 DIAGNOSIS — R351 Nocturia: Secondary | ICD-10-CM | POA: Diagnosis not present

## 2015-04-07 DIAGNOSIS — Z0000 Encounter for general adult medical examination without abnormal findings: Secondary | ICD-10-CM | POA: Diagnosis not present

## 2015-04-07 DIAGNOSIS — E291 Testicular hypofunction: Secondary | ICD-10-CM | POA: Diagnosis not present

## 2015-04-07 DIAGNOSIS — N401 Enlarged prostate with lower urinary tract symptoms: Secondary | ICD-10-CM | POA: Diagnosis not present

## 2015-04-08 DIAGNOSIS — S43432D Superior glenoid labrum lesion of left shoulder, subsequent encounter: Secondary | ICD-10-CM | POA: Diagnosis not present

## 2015-04-08 LAB — PROTEIN ELECTROPHORESIS, SERUM, WITH REFLEX
A/G Ratio: 1.3 (ref 0.7–1.7)
Albumin: 3.7 g/dL (ref 2.9–4.4)
Alpha 1: 0.2 g/dL (ref 0.0–0.4)
Alpha 2: 0.6 g/dL (ref 0.4–1.0)
Beta: 1 g/dL (ref 0.7–1.3)
Gamma Globulin: 1.1 g/dL (ref 0.4–1.8)
Globulin, Total: 2.9 g/dL (ref 2.2–3.9)
Interpretation(See Below): 0
M-Spike, %: 0.7 g/dL — ABNORMAL HIGH
Total Protein: 6.6 g/dL (ref 6.0–8.5)

## 2015-04-11 DIAGNOSIS — S43432D Superior glenoid labrum lesion of left shoulder, subsequent encounter: Secondary | ICD-10-CM | POA: Diagnosis not present

## 2015-04-14 DIAGNOSIS — S43432D Superior glenoid labrum lesion of left shoulder, subsequent encounter: Secondary | ICD-10-CM | POA: Diagnosis not present

## 2015-04-16 ENCOUNTER — Ambulatory Visit (HOSPITAL_COMMUNITY): Payer: Medicare Other | Attending: Cardiology

## 2015-04-16 DIAGNOSIS — R9430 Abnormal result of cardiovascular function study, unspecified: Secondary | ICD-10-CM | POA: Insufficient documentation

## 2015-04-22 ENCOUNTER — Telehealth: Payer: Self-pay

## 2015-04-22 DIAGNOSIS — R0602 Shortness of breath: Secondary | ICD-10-CM

## 2015-04-22 DIAGNOSIS — R5383 Other fatigue: Secondary | ICD-10-CM

## 2015-04-22 DIAGNOSIS — R9439 Abnormal result of other cardiovascular function study: Secondary | ICD-10-CM

## 2015-04-22 NOTE — Telephone Encounter (Signed)
Pt was in on 2/2 for stress echo, he was unable to complete test and is very anxious on results and what next steps he will need to do.  ECHO test currently "in progress".  Message sent to Echo department for update on progress of results.  Told pt note would be sent to Dr. Percival Spanish to make him aware and that someone from our office would be in touch with him by the end of the week.  Pt verbalized understanding, no questions at this time.

## 2015-04-23 NOTE — Telephone Encounter (Signed)
Spoke with Echo tech as pt was unable to complete test, there is no report completed.  He stated that pt was unable to reach [employer] and started to have multiple PVC's.  Stated their department would be in contact with MD to talk through details.

## 2015-04-24 DIAGNOSIS — M19012 Primary osteoarthritis, left shoulder: Secondary | ICD-10-CM | POA: Diagnosis not present

## 2015-04-24 DIAGNOSIS — S43432D Superior glenoid labrum lesion of left shoulder, subsequent encounter: Secondary | ICD-10-CM | POA: Diagnosis not present

## 2015-04-24 DIAGNOSIS — M75112 Incomplete rotator cuff tear or rupture of left shoulder, not specified as traumatic: Secondary | ICD-10-CM | POA: Diagnosis not present

## 2015-04-25 NOTE — Telephone Encounter (Signed)
I talked to the patient.  He will need Lexiscan Myoview.  Please call to arrange.  He can be reached on his cell phone.

## 2015-04-25 NOTE — Telephone Encounter (Signed)
Order placed for [employer].  Reviewed instructions with pt who states understanding.

## 2015-04-28 DIAGNOSIS — Z6828 Body mass index (BMI) 28.0-28.9, adult: Secondary | ICD-10-CM | POA: Diagnosis not present

## 2015-04-28 DIAGNOSIS — C9000 Multiple myeloma not having achieved remission: Secondary | ICD-10-CM | POA: Diagnosis not present

## 2015-04-28 DIAGNOSIS — R6889 Other general symptoms and signs: Secondary | ICD-10-CM | POA: Diagnosis not present

## 2015-04-28 DIAGNOSIS — E559 Vitamin D deficiency, unspecified: Secondary | ICD-10-CM | POA: Diagnosis not present

## 2015-04-28 DIAGNOSIS — E569 Vitamin deficiency, unspecified: Secondary | ICD-10-CM | POA: Diagnosis not present

## 2015-04-28 DIAGNOSIS — E291 Testicular hypofunction: Secondary | ICD-10-CM | POA: Diagnosis not present

## 2015-05-01 ENCOUNTER — Encounter (HOSPITAL_BASED_OUTPATIENT_CLINIC_OR_DEPARTMENT_OTHER): Payer: Self-pay | Admitting: Emergency Medicine

## 2015-05-01 ENCOUNTER — Emergency Department (HOSPITAL_BASED_OUTPATIENT_CLINIC_OR_DEPARTMENT_OTHER)
Admission: EM | Admit: 2015-05-01 | Discharge: 2015-05-01 | Disposition: A | Discharge status: Home / Self Care | Payer: Medicare Other | Attending: Emergency Medicine | Admitting: Emergency Medicine

## 2015-05-01 DIAGNOSIS — Z8579 Personal history of other malignant neoplasms of lymphoid, hematopoietic and related tissues: Secondary | ICD-10-CM | POA: Diagnosis not present

## 2015-05-01 DIAGNOSIS — Z880 Allergy status to penicillin: Secondary | ICD-10-CM | POA: Diagnosis not present

## 2015-05-01 DIAGNOSIS — N390 Urinary tract infection, site not specified: Secondary | ICD-10-CM | POA: Insufficient documentation

## 2015-05-01 DIAGNOSIS — R300 Dysuria: Secondary | ICD-10-CM | POA: Diagnosis present

## 2015-05-01 DIAGNOSIS — Z87891 Personal history of nicotine dependence: Secondary | ICD-10-CM | POA: Diagnosis not present

## 2015-05-01 DIAGNOSIS — Z7982 Long term (current) use of aspirin: Secondary | ICD-10-CM | POA: Diagnosis not present

## 2015-05-01 DIAGNOSIS — Z87442 Personal history of urinary calculi: Secondary | ICD-10-CM | POA: Insufficient documentation

## 2015-05-01 DIAGNOSIS — Z79899 Other long term (current) drug therapy: Secondary | ICD-10-CM | POA: Diagnosis not present

## 2015-05-01 LAB — URINALYSIS, ROUTINE W REFLEX MICROSCOPIC
Bilirubin Urine: NEGATIVE
Glucose, UA: NEGATIVE mg/dL
Ketones, ur: 15 mg/dL — AB
Nitrite: NEGATIVE
Protein, ur: NEGATIVE mg/dL
Specific Gravity, Urine: 1.022 (ref 1.005–1.030)
pH: 5 (ref 5.0–8.0)

## 2015-05-01 LAB — URINE MICROSCOPIC-ADD ON

## 2015-05-01 MED ORDER — CIPROFLOXACIN HCL 500 MG PO TABS: 500.0000 mg | ORAL_TABLET | Freq: Two times a day (BID) | ORAL | Status: DC

## 2015-05-01 NOTE — ED Notes (Signed)
Patient states that he is urinating very frequently and it burns when he voids. Patient reports that this has been going on x 24 hours.

## 2015-05-01 NOTE — ED Provider Notes (Signed)
CSN: [id number]     Arrival date & time 05/01/15  1834 History  By signing my name below, I, Irene Pap, attest that this documentation has been prepared under the direction and in the presence of Blanchie Dessert, MD. Electronically Signed: Irene Pap, ED Scribe. 05/01/2015. 7:49 PM.    Chief Complaint  Patient presents with  . Dysuria   The history is provided by the patient. No language interpreter was used.  HPI Comments: William Barron is a 70 y.o. Male with a hx of hyperlipidemia, prostatitis, nephrolithiasis, and amylodosis who presents to the Emergency Department complaining of dysuria onset 2 days ago. He reports associated frequency and urgency. He does not have a hx of UTI, but he has frequent issues with completely emptying his bladder due to his prostatitis. He uses Androgel daily. He denies fever, chills, nausea, vomiting, flank pain, or rectal pain with BMs. Pt is allergic to penicillin.   Past Medical History  Diagnosis Date  . Hyperlipidemia   . Prostatitis     acute and chronic  . Nephrolithiasis   . Cancer ([place]) 2016    amylodosis, multiple myeloma   Past Surgical History  Procedure Laterality Date  . Rotator cuff repair    . Appendectomy    . Elbow surgery  2003    right  . Colonoscopy  04/07/00  . Hernia repair     Family History  Problem Relation Age of Onset  . CAD Brother 31  . Pancreatic cancer Father   . Parkinsonism Mother    Social History  Substance Use Topics  . Smoking status: Former Smoker    Quit date: 03/15/1977  . Smokeless tobacco: Never Used     Comment: quit 35 years ago  . Alcohol Use: 0.0 oz/week    0 Standard drinks or equivalent per week     Comment: approx 1 drink/night    Review of Systems  Constitutional: Negative for fever and chills.  Gastrointestinal: Negative for nausea, vomiting and rectal pain.  Genitourinary: Positive for dysuria, urgency and frequency. Negative for flank pain.  All other systems  reviewed and are negative.  Allergies  Penicillins  Home Medications   Prior to Admission medications   Medication Sig Start Date End Date Taking? Authorizing Provider  ANDROGEL PUMP 20.25 MG/ACT (1.62%) GEL Apply topically every morning.  03/27/14   Historical Provider, MD  aspirin EC 81 MG tablet 81 mg daily.     Historical Provider, MD  minoxidil (ROGAINE) 2 % external solution Apply 1 application topically daily.     Historical Provider, MD  STENDRA 200 MG TABS  09/25/14   Historical Provider, MD  zolpidem (AMBIEN) 5 MG tablet TAKE 1/2 TO 1 TABLET BY MOUTH AT BEDTIME AS NEEDED FOR SLEEP 01/15/15   Volanda Napoleon, MD   BP 162/79 mmHg  Pulse 66  Temp(Src) 98.2 F (36.8 C) (Oral)  Resp 16  SpO2 100% Physical Exam  Constitutional: He is oriented to person, place, and time. He appears well-developed and well-nourished.  HENT:  Head: Normocephalic and atraumatic.  Eyes: EOM are normal. Pupils are equal, round, and reactive to light.  Neck: Normal range of motion. Neck supple.  Cardiovascular: Normal rate, regular rhythm and normal heart sounds.  Exam reveals no gallop and no friction rub.   No murmur heard. Pulmonary/Chest: Effort normal and breath sounds normal. He has no wheezes.  Abdominal: Soft. There is no tenderness.  Musculoskeletal: Normal range of motion.  Neurological: He is alert  and oriented to person, place, and time.  Skin: Skin is warm and dry.  Psychiatric: He has a normal mood and affect. His behavior is normal.  Nursing note and vitals reviewed.   ED Course  Procedures (including critical care time) DIAGNOSTIC STUDIES: Oxygen Saturation is 100% on RA, normal by my interpretation.    COORDINATION OF CARE: 7:42 PM-Discussed treatment plan which includes UA and anti-biotics with pt at bedside and pt agreed to plan.    Labs Review Labs Reviewed  URINALYSIS, ROUTINE W REFLEX MICROSCOPIC (NOT AT [facility]) - Abnormal; Notable for the following:    APPearance  CLOUDY (*)    Hgb urine dipstick MODERATE (*)    Ketones, ur 15 (*)    Leukocytes, UA LARGE (*)    All other components within normal limits  URINE MICROSCOPIC-ADD ON - Abnormal; Notable for the following:    Squamous Epithelial / LPF 6-30 (*)    Bacteria, UA MANY (*)    All other components within normal limits    Imaging Review No results found. I have personally reviewed and evaluated these images and lab results as part of my medical decision-making.   EKG Interpretation None      MDM   Final diagnoses:  UTI (lower urinary tract infection)    Patient is a 70 year old male with a 2 day history of symptoms concerning for UTI. His only complaint is burning with urination. He denies any rectal tenderness or testicular pain. He has no symptoms concerning for pyelonephritis. UA is consistent with UTI. Patient states often he feels like his urine stream is weak and he has difficulty completely emptying his bladder but is never had a urinary tract infection. He has had prostatitis in the past. Patient's urine was cultured and he was started on Cipro. I personally performed the services described in this documentation, which was scribed in my presence.  The recorded information has been reviewed and considered.    Blanchie Dessert, MD 05/01/15 2019

## 2015-05-02 ENCOUNTER — Telehealth (HOSPITAL_COMMUNITY): Payer: Self-pay

## 2015-05-02 NOTE — Telephone Encounter (Signed)
Encounter complete. 

## 2015-05-04 LAB — URINE CULTURE: Culture: >=100000

## 2015-05-05 ENCOUNTER — Telehealth (HOSPITAL_BASED_OUTPATIENT_CLINIC_OR_DEPARTMENT_OTHER): Payer: Self-pay | Admitting: Emergency Medicine

## 2015-05-05 NOTE — Telephone Encounter (Signed)
Post ED Visit - Positive Culture Follow-up  Culture report reviewed by antimicrobial stewardship pharmacist:  []  Elenor Quinones, Pharm.D. []  Heide Guile, Pharm.D., BCPS [x]  Parks Neptune, Pharm.D. []  Alycia Rossetti, Pharm.D., BCPS []  [employer], Pharm.D., BCPS, AAHIVP []  Legrand Como, Pharm.D., BCPS, AAHIVP []  Milus Glazier, Pharm.D. []  Stephens November, [place].D.  Positive urine culture E.coli Treated with ciprofloxacin, organism sensitive to the same and no further patient follow-up is required at this time.  Hazle Nordmann 05/05/2015, 11:48 AM

## 2015-05-06 ENCOUNTER — Inpatient Hospital Stay (HOSPITAL_COMMUNITY): Admission: RE | Admit: 2015-05-06 | Payer: Self-pay | Source: Ambulatory Visit

## 2015-05-06 ENCOUNTER — Telehealth (HOSPITAL_COMMUNITY): Payer: Self-pay

## 2015-05-06 NOTE — Telephone Encounter (Signed)
Encounter complete. 

## 2015-05-07 ENCOUNTER — Ambulatory Visit (HOSPITAL_COMMUNITY)
Admission: RE | Admit: 2015-05-07 | Discharge: 2015-05-07 | Disposition: A | Discharge status: Home / Self Care | Payer: Medicare Other | Source: Ambulatory Visit | Attending: Cardiovascular Disease | Admitting: Cardiovascular Disease

## 2015-05-07 DIAGNOSIS — R5383 Other fatigue: Secondary | ICD-10-CM | POA: Insufficient documentation

## 2015-05-07 DIAGNOSIS — R0602 Shortness of breath: Secondary | ICD-10-CM | POA: Insufficient documentation

## 2015-05-07 DIAGNOSIS — Z87891 Personal history of nicotine dependence: Secondary | ICD-10-CM | POA: Diagnosis not present

## 2015-05-07 DIAGNOSIS — Z8249 Family history of ischemic heart disease and other diseases of the circulatory system: Secondary | ICD-10-CM | POA: Diagnosis not present

## 2015-05-07 DIAGNOSIS — R0609 Other forms of dyspnea: Secondary | ICD-10-CM | POA: Insufficient documentation

## 2015-05-07 DIAGNOSIS — R9439 Abnormal result of other cardiovascular function study: Secondary | ICD-10-CM

## 2015-05-07 LAB — MYOCARDIAL PERFUSION IMAGING
LV dias vol: 123 mL
LV sys vol: 68 mL
Peak HR: 87 {beats}/min
Rest HR: 62 {beats}/min
SDS: 0
SRS: 1
SSS: 1
TID: 1.17

## 2015-05-07 MED ORDER — TECHNETIUM TC 99M SESTAMIBI GENERIC - CARDIOLITE
10.4000 | Freq: Once | INTRAVENOUS | Status: AC | PRN
  Administered 2015-05-07: 10.4 via INTRAVENOUS

## 2015-05-07 MED ORDER — REGADENOSON 0.4 MG/5ML IV SOLN
0.4000 mg | Freq: Once | INTRAVENOUS | Status: AC
  Administered 2015-05-07: 0.4 mg via INTRAVENOUS

## 2015-05-07 MED ORDER — TECHNETIUM TC 99M SESTAMIBI GENERIC - CARDIOLITE
31.2000 | Freq: Once | INTRAVENOUS | Status: AC | PRN
  Administered 2015-05-07: 31.2 via INTRAVENOUS

## 2015-05-27 ENCOUNTER — Ambulatory Visit (INDEPENDENT_AMBULATORY_CARE_PROVIDER_SITE_OTHER): Payer: Medicare Other | Admitting: Cardiology

## 2015-05-27 ENCOUNTER — Encounter: Payer: Self-pay | Admitting: Cardiology

## 2015-05-27 VITALS — BP 138/74 | HR 76 | Ht 66.0 in | Wt 166.1 lb

## 2015-05-27 DIAGNOSIS — R0602 Shortness of breath: Secondary | ICD-10-CM

## 2015-05-27 DIAGNOSIS — I10 Essential (primary) hypertension: Secondary | ICD-10-CM | POA: Diagnosis not present

## 2015-05-27 NOTE — Progress Notes (Signed)
Cardiology Office Note   Date:  05/27/2015   ID:  William Barron, DOB 06/23/1945, MRN [id number]  PCP:  [name], MD  Cardiologist:   Minus Breeding, MD   Chief Complaint  Patient presents with  . Fatigue      History of Present Illness: William Barron is a 70 y.o. male who presents for evaluation of fatigue. Is a family history of coronary disease.   However, he himself has not had any prior cardiac history. He has had an extensive medical history dating back to 2014. 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 He was treeated at [facility] with Cytoxan Velcade and Decadron.  He had an MRI done in 2000 and 15 in April that demonstrated a preserved ejection fraction of 55%. There were no other significant abnormalities. He had no suggestion of amyloid heart. He's had echocardiograms in October 2015 with EF of 57%. There was a question of moderate to severe pulmonary regurgitation.   I repeated this late last year and it was essentially unchanged.  I did send him for a POET (Plain Old Exercise Treadmill).  This demonstrated some ST depression.  However, follow up [place] did not suggest ischemia.    He returns for follow up.  Unfortunately he is still very fatigued.  He has continued dyspnea with moderate exertion.  The patient denies any new symptoms such as chest discomfort, neck or arm discomfort. There has been no new PND or orthopnea. There have been no reported palpitations, presyncope or syncope.   Past Medical History  Diagnosis Date  . Hyperlipidemia   . Prostatitis     acute and chronic  . Nephrolithiasis   . Cancer ([place]) 2016    amylodosis, multiple myeloma    Past Surgical History  Procedure Laterality Date  . Rotator cuff repair    . Appendectomy    . Elbow surgery  2003    right  . Colonoscopy  04/07/00  . Hernia repair       Current Outpatient  Prescriptions  Medication Sig Dispense Refill  . ANDROGEL PUMP 20.25 MG/ACT (1.62%) GEL Apply topically every morning.     Marland Kitchen aspirin EC 81 MG tablet 81 mg daily.     . minoxidil (ROGAINE) 2 % external solution Apply 1 application topically daily.     Marland Kitchen STENDRA 200 MG TABS     . zolpidem (AMBIEN) 5 MG tablet TAKE 1/2 TO 1 TABLET BY MOUTH AT BEDTIME AS NEEDED FOR SLEEP 30 tablet 2   No current facility-administered medications for this visit.    Allergies:   Penicillins    Social History:  The patient  reports that he quit smoking about 38 years ago. He has never used smokeless tobacco. He reports that he drinks alcohol. He reports that he does not use illicit drugs.   Family History:  The patient's  family history includes CAD (age of onset: 16) in his brother; Pancreatic cancer in his father; Parkinsonism in his mother.    ROS:  Please see the history of present illness.   Otherwise, review of systems are positive for mild memory impairment noted on PCP records.   All other systems are reviewed and negative.    PHYSICAL EXAM: VS:  BP 138/74 mmHg  Pulse 76  Ht '5\' 6"'  (1.676 m)  Wt 166 lb 2 oz (75.354 kg)  BMI 26.83 kg/m2 ,  BMI Body mass index is 26.83 kg/(m^2). GENERAL:  Well appearing HEENT:  Pupils equal round and reactive, fundi not visualized, oral mucosa unremarkable NECK:  No jugular venous distention, waveform within normal limits, carotid upstroke brisk and symmetric, no bruits, no thyromegaly LYMPHATICS:  No cervical, inguinal adenopathy LUNGS:  Clear to auscultation bilaterally BACK:  No CVA tenderness CHEST:  Unremarkable HEART:  PMI not displaced or sustained,S1 and S2 within normal limits, no S3, no S4, no clicks, no rubs, no murmurs ABD:  Flat, positive bowel sounds normal in frequency in pitch, no bruits, no rebound, no guarding, no midline pulsatile mass, no hepatomegaly, no splenomegaly EXT:  2 plus pulses throughout, no edema, no cyanosis no clubbing   EKG:   EKG is not ordered today.    Recent Labs: 02/27/2015: TSH 1.604 04/03/2015: ALT 14; BUN 18; Creatinine, Ser 0.88; HGB 14.8; Platelets 281; Potassium, Ser 3.8; Sodium 139    Lipid Panel    Component Value Date/Time   CHOL 189 03/29/2011 1448   TRIG 87.0 03/29/2011 1448   HDL 45.30 03/29/2011 1448   CHOLHDL 4 03/29/2011 1448   VLDL 17.4 03/29/2011 1448   LDLCALC 126* 03/29/2011 1448      Wt Readings from Last 3 Encounters:  05/27/15 166 lb 2 oz (75.354 kg)  05/07/15 162 lb (73.483 kg)  04/03/15 162 lb (73.483 kg)      Other studies Reviewed: Additional studies/ records that were reviewed today include: [name] records, . Review of the above records demonstrates:  Please see elsewhere in the note.     ASSESSMENT AND PLAN:  FATIGUE:  At this point I don't see a cardiac etiology.  I will however further screen for this and his dyspnea with a coronary calcium score given his significant family history.    DYPSNEA:  BNP was normal.  Work up as above.   PULMONIC REGURGITATION:   This was noted on a previous echo.  I will follow this in Nov.  REDUCED EF:  This was mildly low and I will repeat an echocardiogram in Nov.    Current medicines are reviewed at length with the patient today.  The patient does not have concerns regarding medicines.  The following changes have been made:  no change  Labs/ tests ordered today include:   Orders Placed This Encounter  Procedures  . CT Cardiac Scoring     Disposition:   FU with me in Nov   Signed, Aldina Porta, MD  05/27/2015 5:50 PM    [facility]

## 2015-05-27 NOTE — Patient Instructions (Signed)
Your physician wants you to follow-up in: 4 Months. You will receive a reminder letter in the mail two months in advance. If you don't receive a letter, please call our office to schedule the follow-up appointment.  Your physician want you to have a CT calcium Scoring test this is done at our [facility]

## 2015-05-28 ENCOUNTER — Telehealth: Payer: Self-pay | Admitting: *Deleted

## 2015-05-28 DIAGNOSIS — I429 Cardiomyopathy, unspecified: Secondary | ICD-10-CM

## 2015-05-28 NOTE — Telephone Encounter (Signed)
Spoke with pt letting him know he will be schedule for an Echo in nov and f/u with Dr Percival Spanish after  Echo was ordered for Nov

## 2015-05-28 NOTE — Telephone Encounter (Signed)
Spoke with patient regarding appointment scheduled for coronary calcium scoring ordered by Dr. Percival Spanish.  Scheduled for Monday 06/02/15 at 2:00 pm---arrival time is 1:45 pm for check in.  Patient is aware he will have to pay $150.00 upon arrival.

## 2015-05-28 NOTE — Telephone Encounter (Signed)
-----   Message from Minus Breeding, MD sent at 05/27/2015  5:55 PM EDT ----- Can you schedule an echo for Nov and then follow up with me after that.  Cardiomyopathy.  Thanks.

## 2015-05-30 ENCOUNTER — Other Ambulatory Visit: Payer: Self-pay | Admitting: Hematology & Oncology

## 2015-06-02 ENCOUNTER — Ambulatory Visit (INDEPENDENT_AMBULATORY_CARE_PROVIDER_SITE_OTHER)
Admission: RE | Admit: 2015-06-02 | Discharge: 2015-06-02 | Disposition: A | Discharge status: Home / Self Care | Payer: Self-pay | Source: Ambulatory Visit | Attending: Cardiology | Admitting: Cardiology

## 2015-06-02 DIAGNOSIS — R0602 Shortness of breath: Secondary | ICD-10-CM

## 2015-06-10 ENCOUNTER — Encounter: Payer: Self-pay | Admitting: Interventional Cardiology

## 2015-06-10 ENCOUNTER — Encounter: Payer: Self-pay | Admitting: Cardiology

## 2015-06-10 ENCOUNTER — Ambulatory Visit (INDEPENDENT_AMBULATORY_CARE_PROVIDER_SITE_OTHER): Payer: Medicare Other | Admitting: Cardiology

## 2015-06-10 ENCOUNTER — Other Ambulatory Visit: Payer: Self-pay | Admitting: Cardiology

## 2015-06-10 VITALS — BP 130/70 | HR 68 | Ht 66.0 in | Wt 165.0 lb

## 2015-06-10 DIAGNOSIS — R9439 Abnormal result of other cardiovascular function study: Secondary | ICD-10-CM

## 2015-06-10 DIAGNOSIS — D689 Coagulation defect, unspecified: Secondary | ICD-10-CM

## 2015-06-10 DIAGNOSIS — R5383 Other fatigue: Secondary | ICD-10-CM | POA: Diagnosis not present

## 2015-06-10 DIAGNOSIS — Z01818 Encounter for other preprocedural examination: Secondary | ICD-10-CM

## 2015-06-10 LAB — CBC
HCT: 45.9 % (ref 39.0–52.0)
Hemoglobin: 15.9 g/dL (ref 13.0–17.0)
MCH: 32.9 pg (ref 26.0–34.0)
MCHC: 34.6 g/dL (ref 30.0–36.0)
MCV: 94.8 fL (ref 78.0–100.0)
MPV: 9.9 fL (ref 8.6–12.4)
Platelets: 255 10*3/uL (ref 150–400)
RBC: 4.84 MIL/uL (ref 4.22–5.81)
RDW: 13.4 % (ref 11.5–15.5)
WBC: 5.3 10*3/uL (ref 4.0–10.5)

## 2015-06-10 LAB — BASIC METABOLIC PANEL
BUN: 17 mg/dL (ref 7–25)
CO2: 28 mmol/L (ref 20–31)
Calcium: 9.2 mg/dL (ref 8.6–10.3)
Chloride: 105 mmol/L (ref 98–110)
Creat: 0.99 mg/dL (ref 0.70–1.18)
Glucose, Bld: 91 mg/dL (ref 65–99)
Potassium: 4.5 mmol/L (ref 3.5–5.3)
Sodium: 142 mmol/L (ref 135–146)

## 2015-06-10 LAB — TSH: TSH: 1.81 mIU/L (ref 0.40–4.50)

## 2015-06-10 NOTE — Patient Instructions (Signed)
Your physician has requested that you have a Left cardiac catheterization. Cardiac catheterization is used to diagnose and/or treat various heart conditions. Doctors may recommend this procedure for a number of different reasons. The most common reason is to evaluate chest pain. Chest pain can be a symptom of coronary artery disease (CAD), and cardiac catheterization can show whether plaque is narrowing or blocking your heart's arteries. This procedure is also used to evaluate the valves, as well as measure the blood flow and oxygen levels in different parts of your heart. For further information please visit [email or web address]. Please follow instruction sheet, as given.  Your physician recommends that you return for lab work

## 2015-06-10 NOTE — Progress Notes (Signed)
Cardiology Office Note   Date:  06/10/2015   ID:  William Barron, DOB 08-Nov-1945, MRN [id number]  PCP:  [name], MD  Cardiologist:   Minus Breeding, MD   Chief Complaint  Patient presents with  . Fatigue  . Elevated Coronary Calcium      History of Present Illness: William Barron is a 70 y.o. male who presents for evaluation of fatigue. Is a family history of coronary disease.   However, he himself has not had any prior cardiac history. He has had an extensive medical history dating back to 2014. 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 He was treeated at [facility] with Cytoxan Velcade and Decadron.  He had an MRI done in 2000 and 15 in April that demonstrated a preserved ejection fraction of 55%. There were no other significant abnormalities. He had no suggestion of amyloid heart. He's had echocardiograms in October 2015 with EF of 57%. There was a question of moderate to severe pulmonary regurgitation.   I repeated this late last year and it was essentially unchanged.  I did send him for a POET (Plain Old Exercise Treadmill).  This demonstrated some ST depression.  However, follow up [place] did not suggest ischemia.    Most recently I sent him for a coronary calcium score.  This demonstrated a score of 1292 which was 25 percentile for age and sex matched controls.  He iss still very fatigued.  He has continued dyspnea with moderate exertion.  The patient denies any new symptoms such as chest discomfort, neck or arm discomfort. There has been no new PND or orthopnea. There have been no reported palpitations, presyncope or syncope.   Past Medical History  Diagnosis Date  . Hyperlipidemia   . Prostatitis     acute and chronic  . Nephrolithiasis   . Cancer ([place]) 2016    amylodosis, multiple myeloma    Past Surgical History  Procedure Laterality Date  . Rotator cuff  repair    . Appendectomy    . Elbow surgery  2003    right  . Colonoscopy  04/07/00  . Hernia repair       Current Outpatient Prescriptions  Medication Sig Dispense Refill  . ANDROGEL PUMP 20.25 MG/ACT (1.62%) GEL Apply 3 application topically every morning.     . zolpidem (AMBIEN) 5 MG tablet TAKE 1/2 TO 1 TABLET BY MOUTH AT BEDTIME AS NEEDED FOR SLEEP (Patient taking differently: TAKE 2.5-5 MG BY MOUTH AT BEDTIME AS NEEDED FOR SLEEP) 30 tablet 2  . Cholecalciferol (VITAMIN D3 PO) Take 1 capsule by mouth daily.    . Omega-3 Fatty Acids (FISH OIL PO) Take 1 capsule by mouth daily.     No current facility-administered medications for this visit.    Allergies:   Penicillins    Social History:  The patient  reports that he quit smoking about 38 years ago. He has never used smokeless tobacco. He reports that he drinks alcohol. He reports that he does not use illicit drugs.   Family History:  The patient's  family history includes CAD (age of onset: 18) in his brother; Pancreatic cancer in his father; Parkinsonism in his mother.    ROS:  Please see the history of present illness.   Otherwise, review of systems are positive for mild memory impairment noted on PCP records.   All other systems are  reviewed and negative.    PHYSICAL EXAM: VS:  BP 130/70 mmHg  Pulse 68  Ht 5' 6" (1.676 m)  Wt 165 lb (74.844 kg)  BMI 26.64 kg/m2 , BMI Body mass index is 26.64 kg/(m^2). GENERAL:  Well appearing HEENT:  Pupils equal round and reactive, fundi not visualized, oral mucosa unremarkable NECK:  No jugular venous distention, waveform within normal limits, carotid upstroke brisk and symmetric, no bruits, no thyromegaly LYMPHATICS:  No cervical, inguinal adenopathy LUNGS:  Clear to auscultation bilaterally BACK:  No CVA tenderness CHEST:  Unremarkable HEART:  PMI not displaced or sustained,S1 and S2 within normal limits, no S3, no S4, no clicks, no rubs, no murmurs ABD:  Flat, positive bowel  sounds normal in frequency in pitch, no bruits, no rebound, no guarding, no midline pulsatile mass, no hepatomegaly, no splenomegaly EXT:  2 plus pulses throughout, no edema, no cyanosis no clubbing NEURO:  Nonfocal.    EKG:  EKG is not ordered today.    Recent Labs: 02/27/2015: TSH 1.604 04/03/2015: ALT 14; BUN 18; Creatinine, Ser 0.88; HGB 14.8; Platelets 281; Potassium, Ser 3.8; Sodium 139    Lipid Panel    Component Value Date/Time   CHOL 189 03/29/2011 1448   TRIG 87.0 03/29/2011 1448   HDL 45.30 03/29/2011 1448   CHOLHDL 4 03/29/2011 1448   VLDL 17.4 03/29/2011 1448   LDLCALC 126* 03/29/2011 1448      Wt Readings from Last 3 Encounters:  06/10/15 165 lb (74.844 kg)  05/27/15 166 lb 2 oz (75.354 kg)  05/07/15 162 lb (73.483 kg)      Other studies Reviewed: Additional studies/ records that were reviewed today include: [name] records, . Review of the above records demonstrates:  Please see elsewhere in the note.     ASSESSMENT AND PLAN:  ELEVATED CORONARY CALCIUM:  He continues to complain of decreasing exercise tolerance and increasing fatigue. He has continued SOB.  He did have some ST depression on his stress testing. He has a significant amount of coronary calcium. Therefore it is quite possible that his perfusion images represent balanced ischemia. Therefore, cardiac catheterization is indicated. The patient understands that risks included but are not limited to stroke (1 in 1000), death (1 in 50), kidney failure [usually temporary] (1 in 500), bleeding (1 in 200), allergic reaction [possibly serious] (1 in 200).  The patient understands and agrees to proceed. Marland Kitchen     DYPSNEA:  BNP was normal.  Work up as above.   PULMONIC REGURGITATION:   This was noted on a previous echo.  I will follow this in Nov.  REDUCED EF:  This was mildly low and I will repeat an echocardiogram in Nov.   this will also be evaluated as above.   Current medicines are reviewed at length  with the patient today.  The patient does not have concerns regarding medicines.  The following changes have been made:  no change  Labs/ tests ordered today include:     Orders Placed This Encounter  Procedures  . Basic Metabolic Panel (BMET)  . CBC  . TSH  . INR/PT  . PTT  . LEFT HEART CATHETERIZATION WITH CORONARY ANGIOGRAM     Disposition:   FU with me after the cath.   Signed, Minus Breeding, MD  06/10/2015 10:52 PM    [facility]

## 2015-06-11 ENCOUNTER — Ambulatory Visit: Payer: Self-pay | Admitting: Cardiology

## 2015-06-11 LAB — PROTIME-INR
INR: 1.08 (ref <1.50)
Prothrombin Time: 14.1 seconds (ref 11.6–15.2)

## 2015-06-11 LAB — APTT: aPTT: 31 seconds (ref 24–37)

## 2015-06-13 ENCOUNTER — Encounter (HOSPITAL_COMMUNITY)
Admission: RE | Disposition: A | Discharge status: Home / Self Care | Payer: Self-pay | Source: Ambulatory Visit | Attending: Interventional Cardiology

## 2015-06-13 ENCOUNTER — Encounter (HOSPITAL_COMMUNITY): Payer: Self-pay | Admitting: Interventional Cardiology

## 2015-06-13 ENCOUNTER — Ambulatory Visit (HOSPITAL_COMMUNITY)
Admission: RE | Admit: 2015-06-13 | Discharge: 2015-06-13 | Disposition: A | Discharge status: Home / Self Care | Payer: Medicare Other | Source: Ambulatory Visit | Attending: Interventional Cardiology | Admitting: Interventional Cardiology

## 2015-06-13 DIAGNOSIS — R5383 Other fatigue: Secondary | ICD-10-CM | POA: Diagnosis not present

## 2015-06-13 DIAGNOSIS — Z8249 Family history of ischemic heart disease and other diseases of the circulatory system: Secondary | ICD-10-CM | POA: Diagnosis not present

## 2015-06-13 DIAGNOSIS — Z880 Allergy status to penicillin: Secondary | ICD-10-CM | POA: Insufficient documentation

## 2015-06-13 DIAGNOSIS — E785 Hyperlipidemia, unspecified: Secondary | ICD-10-CM | POA: Diagnosis not present

## 2015-06-13 DIAGNOSIS — I371 Nonrheumatic pulmonary valve insufficiency: Secondary | ICD-10-CM | POA: Diagnosis not present

## 2015-06-13 DIAGNOSIS — Z87891 Personal history of nicotine dependence: Secondary | ICD-10-CM | POA: Diagnosis not present

## 2015-06-13 DIAGNOSIS — R9439 Abnormal result of other cardiovascular function study: Secondary | ICD-10-CM

## 2015-06-13 DIAGNOSIS — R0609 Other forms of dyspnea: Secondary | ICD-10-CM | POA: Insufficient documentation

## 2015-06-13 DIAGNOSIS — R0600 Dyspnea, unspecified: Secondary | ICD-10-CM | POA: Insufficient documentation

## 2015-06-13 DIAGNOSIS — I2510 Atherosclerotic heart disease of native coronary artery without angina pectoris: Secondary | ICD-10-CM | POA: Insufficient documentation

## 2015-06-13 HISTORY — PX: CARDIAC CATHETERIZATION: SHX172

## 2015-06-13 SURGERY — LEFT HEART CATH AND CORONARY ANGIOGRAPHY
Anesthesia: LOCAL

## 2015-06-13 MED ORDER — VERAPAMIL HCL 2.5 MG/ML IV SOLN
INTRAVENOUS | Status: DC | PRN
  Administered 2015-06-13: 09:00:00 via INTRA_ARTERIAL

## 2015-06-13 MED ORDER — IOPAMIDOL (ISOVUE-370) INJECTION 76%
INTRAVENOUS | Status: AC
  Filled 2015-06-13: qty 100

## 2015-06-13 MED ORDER — SODIUM CHLORIDE 0.9% FLUSH: 3.0000 mL | INTRAVENOUS | Status: DC | PRN

## 2015-06-13 MED ORDER — FENTANYL CITRATE (PF) 100 MCG/2ML IJ SOLN
INTRAMUSCULAR | Status: DC | PRN
  Administered 2015-06-13: 25 ug via INTRAVENOUS

## 2015-06-13 MED ORDER — HEPARIN (PORCINE) IN NACL 2-0.9 UNIT/ML-% IJ SOLN
INTRAMUSCULAR | Status: DC | PRN
  Administered 2015-06-13: 1000 mL

## 2015-06-13 MED ORDER — LIDOCAINE HCL (PF) 1 % IJ SOLN
INTRAMUSCULAR | Status: DC | PRN
  Administered 2015-06-13: 2 mL via SUBCUTANEOUS

## 2015-06-13 MED ORDER — SODIUM CHLORIDE 0.9 % IV SOLN: 250.0000 mL | INTRAVENOUS | Status: DC | PRN

## 2015-06-13 MED ORDER — HEPARIN (PORCINE) IN NACL 2-0.9 UNIT/ML-% IJ SOLN
INTRAMUSCULAR | Status: AC
  Filled 2015-06-13: qty 1000

## 2015-06-13 MED ORDER — SODIUM CHLORIDE 0.9 % WEIGHT BASED INFUSION
3.0000 mL/kg/h | INTRAVENOUS | Status: AC
  Administered 2015-06-13: 3 mL/kg/h via INTRAVENOUS

## 2015-06-13 MED ORDER — IOPAMIDOL (ISOVUE-370) INJECTION 76%
INTRAVENOUS | Status: DC | PRN
  Administered 2015-06-13: 60 mL

## 2015-06-13 MED ORDER — SODIUM CHLORIDE 0.9 % WEIGHT BASED INFUSION: 1.0000 mL/kg/h | INTRAVENOUS | Status: DC

## 2015-06-13 MED ORDER — VERAPAMIL HCL 2.5 MG/ML IV SOLN
INTRAVENOUS | Status: AC
  Filled 2015-06-13: qty 2

## 2015-06-13 MED ORDER — SODIUM CHLORIDE 0.9% FLUSH: 3.0000 mL | Freq: Two times a day (BID) | INTRAVENOUS | Status: DC

## 2015-06-13 MED ORDER — MIDAZOLAM HCL 2 MG/2ML IJ SOLN
INTRAMUSCULAR | Status: DC | PRN
  Administered 2015-06-13: 2 mg via INTRAVENOUS

## 2015-06-13 MED ORDER — ASPIRIN 81 MG PO CHEW
81.0000 mg | CHEWABLE_TABLET | ORAL | Status: AC
  Administered 2015-06-13: 81 mg via ORAL
  Filled 2015-06-13: qty 1

## 2015-06-13 MED ORDER — LIDOCAINE HCL (PF) 1 % IJ SOLN
INTRAMUSCULAR | Status: AC
  Filled 2015-06-13: qty 30

## 2015-06-13 MED ORDER — HEPARIN SODIUM (PORCINE) 1000 UNIT/ML IJ SOLN
INTRAMUSCULAR | Status: DC | PRN
  Administered 2015-06-13: 3500 [IU] via INTRAVENOUS

## 2015-06-13 MED ORDER — HEPARIN SODIUM (PORCINE) 1000 UNIT/ML IJ SOLN
INTRAMUSCULAR | Status: AC
  Filled 2015-06-13: qty 1

## 2015-06-13 MED ORDER — MIDAZOLAM HCL 2 MG/2ML IJ SOLN
INTRAMUSCULAR | Status: AC
  Filled 2015-06-13: qty 2

## 2015-06-13 MED ORDER — FENTANYL CITRATE (PF) 100 MCG/2ML IJ SOLN
INTRAMUSCULAR | Status: AC
  Filled 2015-06-13: qty 2

## 2015-06-13 SURGICAL SUPPLY — 11 items

## 2015-06-13 NOTE — Interval H&P Note (Signed)
Cath Lab Visit (complete for each Cath Lab visit)  Clinical Evaluation Leading to the Procedure:   ACS: No.  Non-ACS:    Anginal Classification: Class III  Anti-ischemic medical therapy: 0 meds  Non-Invasive Test Results: Low-risk stress test findings: cardiac mortality <1%/year  Prior CABG: No previous CABG      History and Physical Interval Note:  06/13/2015 8:50 AM  William Barron  has presented today for surgery, with the diagnosis of Abnormal Stress Test  The various methods of treatment have been discussed with the patient and family. After consideration of risks, benefits and other options for treatment, the patient has consented to  Procedure(s): Left Heart Cath and Coronary Angiography (N/A) as a surgical intervention .  The patient's history has been reviewed, patient examined, no change in status, stable for surgery.  I have reviewed the patient's chart and labs.  Questions were answered to the patient's satisfaction.     [name] S.

## 2015-06-13 NOTE — H&P (View-Only) (Signed)
Cardiology Office Note   Date:  06/10/2015   ID:  William Barron, DOB 08-Nov-1945, MRN [id number]  PCP:  [name], MD  Cardiologist:   Minus Breeding, MD   Chief Complaint  Patient presents with  . Fatigue  . Elevated Coronary Calcium      History of Present Illness: William Barron is a 70 y.o. male who presents for evaluation of fatigue. Is a family history of coronary disease.   However, he himself has not had any prior cardiac history. He has had an extensive medical history dating back to 2014. 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 He was treeated at [facility] with Cytoxan Velcade and Decadron.  He had an MRI done in 2000 and 15 in April that demonstrated a preserved ejection fraction of 55%. There were no other significant abnormalities. He had no suggestion of amyloid heart. He's had echocardiograms in October 2015 with EF of 57%. There was a question of moderate to severe pulmonary regurgitation.   I repeated this late last year and it was essentially unchanged.  I did send him for a POET (Plain Old Exercise Treadmill).  This demonstrated some ST depression.  However, follow up [place] did not suggest ischemia.    Most recently I sent him for a coronary calcium score.  This demonstrated a score of 1292 which was 25 percentile for age and sex matched controls.  He iss still very fatigued.  He has continued dyspnea with moderate exertion.  The patient denies any new symptoms such as chest discomfort, neck or arm discomfort. There has been no new PND or orthopnea. There have been no reported palpitations, presyncope or syncope.   Past Medical History  Diagnosis Date  . Hyperlipidemia   . Prostatitis     acute and chronic  . Nephrolithiasis   . Cancer ([place]) 2016    amylodosis, multiple myeloma    Past Surgical History  Procedure Laterality Date  . Rotator cuff  repair    . Appendectomy    . Elbow surgery  2003    right  . Colonoscopy  04/07/00  . Hernia repair       Current Outpatient Prescriptions  Medication Sig Dispense Refill  . ANDROGEL PUMP 20.25 MG/ACT (1.62%) GEL Apply 3 application topically every morning.     . zolpidem (AMBIEN) 5 MG tablet TAKE 1/2 TO 1 TABLET BY MOUTH AT BEDTIME AS NEEDED FOR SLEEP (Patient taking differently: TAKE 2.5-5 MG BY MOUTH AT BEDTIME AS NEEDED FOR SLEEP) 30 tablet 2  . Cholecalciferol (VITAMIN D3 PO) Take 1 capsule by mouth daily.    . Omega-3 Fatty Acids (FISH OIL PO) Take 1 capsule by mouth daily.     No current facility-administered medications for this visit.    Allergies:   Penicillins    Social History:  The patient  reports that he quit smoking about 38 years ago. He has never used smokeless tobacco. He reports that he drinks alcohol. He reports that he does not use illicit drugs.   Family History:  The patient's  family history includes CAD (age of onset: 18) in his brother; Pancreatic cancer in his father; Parkinsonism in his mother.    ROS:  Please see the history of present illness.   Otherwise, review of systems are positive for mild memory impairment noted on PCP records.   All other systems are  reviewed and negative.    PHYSICAL EXAM: VS:  BP 130/70 mmHg  Pulse 68  Ht 5' 6" (1.676 m)  Wt 165 lb (74.844 kg)  BMI 26.64 kg/m2 , BMI Body mass index is 26.64 kg/(m^2). GENERAL:  Well appearing HEENT:  Pupils equal round and reactive, fundi not visualized, oral mucosa unremarkable NECK:  No jugular venous distention, waveform within normal limits, carotid upstroke brisk and symmetric, no bruits, no thyromegaly LYMPHATICS:  No cervical, inguinal adenopathy LUNGS:  Clear to auscultation bilaterally BACK:  No CVA tenderness CHEST:  Unremarkable HEART:  PMI not displaced or sustained,S1 and S2 within normal limits, no S3, no S4, no clicks, no rubs, no murmurs ABD:  Flat, positive bowel  sounds normal in frequency in pitch, no bruits, no rebound, no guarding, no midline pulsatile mass, no hepatomegaly, no splenomegaly EXT:  2 plus pulses throughout, no edema, no cyanosis no clubbing NEURO:  Nonfocal.    EKG:  EKG is not ordered today.    Recent Labs: 02/27/2015: TSH 1.604 04/03/2015: ALT 14; BUN 18; Creatinine, Ser 0.88; HGB 14.8; Platelets 281; Potassium, Ser 3.8; Sodium 139    Lipid Panel    Component Value Date/Time   CHOL 189 03/29/2011 1448   TRIG 87.0 03/29/2011 1448   HDL 45.30 03/29/2011 1448   CHOLHDL 4 03/29/2011 1448   VLDL 17.4 03/29/2011 1448   LDLCALC 126* 03/29/2011 1448      Wt Readings from Last 3 Encounters:  06/10/15 165 lb (74.844 kg)  05/27/15 166 lb 2 oz (75.354 kg)  05/07/15 162 lb (73.483 kg)      Other studies Reviewed: Additional studies/ records that were reviewed today include: [name] records, . Review of the above records demonstrates:  Please see elsewhere in the note.     ASSESSMENT AND PLAN:  ELEVATED CORONARY CALCIUM:  He continues to complain of decreasing exercise tolerance and increasing fatigue. He has continued SOB.  He did have some ST depression on his stress testing. He has a significant amount of coronary calcium. Therefore it is quite possible that his perfusion images represent balanced ischemia. Therefore, cardiac catheterization is indicated. The patient understands that risks included but are not limited to stroke (1 in 1000), death (1 in 50), kidney failure [usually temporary] (1 in 500), bleeding (1 in 200), allergic reaction [possibly serious] (1 in 200).  The patient understands and agrees to proceed. Marland Kitchen     DYPSNEA:  BNP was normal.  Work up as above.   PULMONIC REGURGITATION:   This was noted on a previous echo.  I will follow this in Nov.  REDUCED EF:  This was mildly low and I will repeat an echocardiogram in Nov.   this will also be evaluated as above.   Current medicines are reviewed at length  with the patient today.  The patient does not have concerns regarding medicines.  The following changes have been made:  no change  Labs/ tests ordered today include:     Orders Placed This Encounter  Procedures  . Basic Metabolic Panel (BMET)  . CBC  . TSH  . INR/PT  . PTT  . LEFT HEART CATHETERIZATION WITH CORONARY ANGIOGRAM     Disposition:   FU with me after the cath.   Signed, Minus Breeding, MD  06/10/2015 10:52 PM    [facility]

## 2015-06-13 NOTE — Discharge Instructions (Signed)
Radial Site Care °Refer to this sheet in the next few weeks. These instructions provide you with information about caring for yourself after your procedure. Your health care provider may also give you more specific instructions. Your treatment has been planned according to current medical practices, but problems sometimes occur. Call your health care provider if you have any problems or questions after your procedure. °WHAT TO EXPECT AFTER THE PROCEDURE °After your procedure, it is typical to have the following: °· Bruising at the radial site that usually fades within 1-2 weeks. °· Blood collecting in the tissue (hematoma) that may be painful to the touch. It should usually decrease in size and tenderness within 1-2 weeks. °HOME CARE INSTRUCTIONS °· Take medicines only as directed by your health care provider. °· You may shower 24-48 hours after the procedure or as directed by your health care provider. Remove the bandage (dressing) and gently wash the site with plain soap and water. Pat the area dry with a clean towel. Do not rub the site, because this may cause bleeding. °· Do not take baths, swim, or use a hot tub until your health care provider approves. °· Check your insertion site every day for redness, swelling, or drainage. °· Do not apply powder or lotion to the site. °· Do not flex or bend the affected arm for 24 hours or as directed by your health care provider. °· Do not push or pull heavy objects with the affected arm for 24 hours or as directed by your health care provider. °· Do not lift over 10 lb (4.5 kg) for 5 days after your procedure or as directed by your health care provider. °· Ask your health care provider when it is okay to: °¨ Return to work or school. °¨ Resume usual physical activities or sports. °¨ Resume sexual activity. °· Do not drive home if you are discharged the same day as the procedure. Have someone else drive you. °· You may drive 24 hours after the procedure unless otherwise  instructed by your health care provider. °· Do not operate machinery or power tools for 24 hours after the procedure. °· If your procedure was done as an outpatient procedure, which means that you went home the same day as your procedure, a responsible adult should be with you for the first 24 hours after you arrive home. °· Keep all follow-up visits as directed by your health care provider. This is important. °SEEK MEDICAL CARE IF: °· You have a fever. °· You have chills. °· You have increased bleeding from the radial site. Hold pressure on the site and call 911. °SEEK IMMEDIATE MEDICAL CARE IF: °· You have unusual pain at the radial site. °· You have redness, warmth, or swelling at the radial site. °· You have drainage (other than a small amount of blood on the dressing) from the radial site. °· The radial site is bleeding, and the bleeding does not stop after 30 minutes of holding steady pressure on the site. °· Your arm or hand becomes pale, cool, tingly, or numb. °  °This information is not intended to replace advice given to you by your health care provider. Make sure you discuss any questions you have with your health care provider. °  °Document Released: 04/03/2010 Document Revised: 03/22/2014 Document Reviewed: 09/17/2013 °Elsevier Interactive Patient Education ©2016 [employer] Inc. ° °

## 2015-06-19 DIAGNOSIS — Z4789 Encounter for other orthopedic aftercare: Secondary | ICD-10-CM | POA: Diagnosis not present

## 2015-06-19 DIAGNOSIS — S43432D Superior glenoid labrum lesion of left shoulder, subsequent encounter: Secondary | ICD-10-CM | POA: Diagnosis not present

## 2015-06-25 DIAGNOSIS — E859 Amyloidosis, unspecified: Secondary | ICD-10-CM | POA: Diagnosis not present

## 2015-06-25 DIAGNOSIS — I2510 Atherosclerotic heart disease of native coronary artery without angina pectoris: Secondary | ICD-10-CM | POA: Diagnosis not present

## 2015-06-25 DIAGNOSIS — Z8579 Personal history of other malignant neoplasms of lymphoid, hematopoietic and related tissues: Secondary | ICD-10-CM | POA: Diagnosis not present

## 2015-06-25 DIAGNOSIS — E559 Vitamin D deficiency, unspecified: Secondary | ICD-10-CM | POA: Diagnosis not present

## 2015-06-25 DIAGNOSIS — E858 Other amyloidosis: Secondary | ICD-10-CM | POA: Diagnosis not present

## 2015-06-25 DIAGNOSIS — I517 Cardiomegaly: Secondary | ICD-10-CM | POA: Diagnosis not present

## 2015-06-25 DIAGNOSIS — C9000 Multiple myeloma not having achieved remission: Secondary | ICD-10-CM | POA: Diagnosis not present

## 2015-06-30 DIAGNOSIS — E859 Amyloidosis, unspecified: Secondary | ICD-10-CM | POA: Diagnosis not present

## 2015-06-30 DIAGNOSIS — Z125 Encounter for screening for malignant neoplasm of prostate: Secondary | ICD-10-CM | POA: Diagnosis not present

## 2015-06-30 DIAGNOSIS — I2510 Atherosclerotic heart disease of native coronary artery without angina pectoris: Secondary | ICD-10-CM | POA: Diagnosis not present

## 2015-06-30 DIAGNOSIS — Z0000 Encounter for general adult medical examination without abnormal findings: Secondary | ICD-10-CM | POA: Diagnosis not present

## 2015-07-07 DIAGNOSIS — E859 Amyloidosis, unspecified: Secondary | ICD-10-CM | POA: Diagnosis not present

## 2015-07-07 DIAGNOSIS — Z0000 Encounter for general adult medical examination without abnormal findings: Secondary | ICD-10-CM | POA: Diagnosis not present

## 2015-07-07 DIAGNOSIS — Z6827 Body mass index (BMI) 27.0-27.9, adult: Secondary | ICD-10-CM | POA: Diagnosis not present

## 2015-07-07 DIAGNOSIS — D7589 Other specified diseases of blood and blood-forming organs: Secondary | ICD-10-CM | POA: Diagnosis not present

## 2015-07-07 DIAGNOSIS — R413 Other amnesia: Secondary | ICD-10-CM | POA: Diagnosis not present

## 2015-07-07 DIAGNOSIS — E291 Testicular hypofunction: Secondary | ICD-10-CM | POA: Diagnosis not present

## 2015-07-07 DIAGNOSIS — I2510 Atherosclerotic heart disease of native coronary artery without angina pectoris: Secondary | ICD-10-CM | POA: Diagnosis not present

## 2015-07-07 DIAGNOSIS — C9000 Multiple myeloma not having achieved remission: Secondary | ICD-10-CM | POA: Diagnosis not present

## 2015-07-07 DIAGNOSIS — R9439 Abnormal result of other cardiovascular function study: Secondary | ICD-10-CM | POA: Diagnosis not present

## 2015-07-07 DIAGNOSIS — Z1389 Encounter for screening for other disorder: Secondary | ICD-10-CM | POA: Diagnosis not present

## 2015-07-08 DIAGNOSIS — D7589 Other specified diseases of blood and blood-forming organs: Secondary | ICD-10-CM | POA: Diagnosis not present

## 2015-07-08 DIAGNOSIS — R413 Other amnesia: Secondary | ICD-10-CM | POA: Diagnosis not present

## 2015-09-01 DIAGNOSIS — D1801 Hemangioma of skin and subcutaneous tissue: Secondary | ICD-10-CM | POA: Diagnosis not present

## 2015-09-01 DIAGNOSIS — Z85820 Personal history of malignant melanoma of skin: Secondary | ICD-10-CM | POA: Diagnosis not present

## 2015-09-01 DIAGNOSIS — D2271 Melanocytic nevi of right lower limb, including hip: Secondary | ICD-10-CM | POA: Diagnosis not present

## 2015-09-01 DIAGNOSIS — B351 Tinea unguium: Secondary | ICD-10-CM | POA: Diagnosis not present

## 2015-09-01 DIAGNOSIS — D225 Melanocytic nevi of trunk: Secondary | ICD-10-CM | POA: Diagnosis not present

## 2015-09-01 DIAGNOSIS — L821 Other seborrheic keratosis: Secondary | ICD-10-CM | POA: Diagnosis not present

## 2015-09-01 DIAGNOSIS — L578 Other skin changes due to chronic exposure to nonionizing radiation: Secondary | ICD-10-CM | POA: Diagnosis not present

## 2015-09-01 DIAGNOSIS — L812 Freckles: Secondary | ICD-10-CM | POA: Diagnosis not present

## 2015-10-01 DIAGNOSIS — N401 Enlarged prostate with lower urinary tract symptoms: Secondary | ICD-10-CM | POA: Diagnosis not present

## 2015-10-06 DIAGNOSIS — E291 Testicular hypofunction: Secondary | ICD-10-CM | POA: Diagnosis not present

## 2015-10-06 DIAGNOSIS — N5201 Erectile dysfunction due to arterial insufficiency: Secondary | ICD-10-CM | POA: Diagnosis not present

## 2015-10-06 DIAGNOSIS — R351 Nocturia: Secondary | ICD-10-CM | POA: Diagnosis not present

## 2015-10-06 DIAGNOSIS — Z87442 Personal history of urinary calculi: Secondary | ICD-10-CM | POA: Diagnosis not present

## 2015-10-06 DIAGNOSIS — N401 Enlarged prostate with lower urinary tract symptoms: Secondary | ICD-10-CM | POA: Diagnosis not present

## 2015-10-21 ENCOUNTER — Telehealth: Payer: Self-pay | Admitting: Neurology

## 2015-10-21 NOTE — Telephone Encounter (Signed)
Pt would like to switch from Dr. Felecia Shelling to Dr. Jannifer Franklin. Pt would like to come in as soon as possible for an appt with Dr. Jannifer Franklin. Please call and advise [phone]

## 2015-10-21 NOTE — Telephone Encounter (Signed)
Ok with me 

## 2015-10-21 NOTE — Telephone Encounter (Signed)
I called and left a message. I will be happy see the patient is approved through Dr. Felecia Shelling who is currently his established neurologist.

## 2015-10-22 ENCOUNTER — Telehealth: Payer: Self-pay | Admitting: Neurology

## 2015-10-22 ENCOUNTER — Telehealth: Payer: Self-pay | Admitting: *Deleted

## 2015-10-22 NOTE — Telephone Encounter (Signed)
[place].  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  Dr. Felecia Shelling would like to confirm that Mr. William Barron is requesting this letter (not being requested by someone other than the pt.).  He also needs to know if the letter should be addressed to anyone in particular/fim

## 2015-10-22 NOTE — Telephone Encounter (Signed)
Gala Romney called said she helping the pt (she works with him) and needs a letter sent to the state bar concerning his medical condition. She said she will fax a letter that will explain what is needed.

## 2015-10-22 NOTE — Telephone Encounter (Signed)
Noted/fim 

## 2015-10-23 ENCOUNTER — Encounter: Payer: Self-pay | Admitting: Neurology

## 2015-10-23 NOTE — Telephone Encounter (Signed)
I called patient back and confirmed that he does need this letter addressed to "To Whom It May Concern" and would like it ASAP. I advised him that we will call him when it is ready and patient requested to pick this up at our office.

## 2015-10-23 NOTE — Telephone Encounter (Signed)
Letter is written and waiting for Dr. Garth Bigness approval. I typed the exact statement the patient requested.

## 2015-10-23 NOTE — Telephone Encounter (Signed)
Pt returned Faith's call. He can be reached at [phone]

## 2015-10-23 NOTE — Telephone Encounter (Signed)
I called patient back and he is aware that letter is at front desk ready to be picked up. The letter I started has been disregarded. Dr. Felecia Shelling wrote new letter.

## 2015-10-24 ENCOUNTER — Telehealth: Payer: Self-pay | Admitting: Neurology

## 2015-10-24 MED ORDER — BUSPIRONE HCL 10 MG PO TABS: 10.0000 mg | ORAL_TABLET | Freq: Two times a day (BID) | ORAL | 3 refills | Status: DC

## 2015-10-24 NOTE — Telephone Encounter (Signed)
I have spoken with Mr. Feuerhelm this am, and per RAS, offered Buspar 10mg  po bid.  Per RAS, I advised this is not an SSRI.  Most pt's tolerate it very well.  It takes a couple of weeks to kick in; after several weeks, dose can be increased prn.  Pt. is agreeable, so rx. has been escribed [facility] per his request.  He will be f/u with Dr. Jannifer Franklin, so further changes can be made by him or pcp prn/fim

## 2015-10-24 NOTE — Telephone Encounter (Signed)
Pt called said he had some very important dates coming up and has some anxiety. He needs something to calm him down. [facility] He said he appreciated the letter it was very helpful. He said his wife knows a little about medications and hopes Dr Felecia Shelling does not order an SSRI. He said she is not a dr or RN just a good girl. Please call him today. Thank you

## 2015-11-04 ENCOUNTER — Ambulatory Visit (INDEPENDENT_AMBULATORY_CARE_PROVIDER_SITE_OTHER): Payer: Medicare Other | Admitting: Neurology

## 2015-11-04 ENCOUNTER — Encounter: Payer: Self-pay | Admitting: Neurology

## 2015-11-04 ENCOUNTER — Other Ambulatory Visit: Payer: Self-pay | Admitting: Hematology & Oncology

## 2015-11-04 VITALS — BP 136/76 | HR 78 | Resp 16 | Ht 68.0 in | Wt 158.0 lb

## 2015-11-04 DIAGNOSIS — R413 Other amnesia: Secondary | ICD-10-CM | POA: Diagnosis not present

## 2015-11-04 DIAGNOSIS — E559 Vitamin D deficiency, unspecified: Secondary | ICD-10-CM

## 2015-11-04 DIAGNOSIS — C9001 Multiple myeloma in remission: Secondary | ICD-10-CM | POA: Diagnosis not present

## 2015-11-04 MED ORDER — CHOLECALCIFEROL 1.25 MG (50000 UT) PO CAPS: ORAL_CAPSULE | ORAL | 0 refills | Status: DC

## 2015-11-04 MED ORDER — FA-PYRIDOXINE-CYANOCOBALAMIN 2.5-25-2 MG PO TABS: 1.0000 | ORAL_TABLET | Freq: Every day | ORAL | 11 refills | Status: DC

## 2015-11-04 NOTE — Progress Notes (Signed)
 [facility]  PATIENT: William Barron DOB: 06/25/1945  REFERRING DOCTOR OR PCP:  Scott Holwerda SOURCE: patient, records in EMR and from [facility], MRI report and lab results  _________________________________   HISTORICAL  CHIEF COMPLAINT:  Chief Complaint  Patient presents with  . Memory Loss    Sts. memory is some worse.  Wife agrees.  MOCA completed today/fim    HISTORY OF PRESENT ILLNESS:  William Barron is a 70 yo man with mild cognitive impairment.   He feels he is doing worse.,     In 2012, his wife started to notice mild cognitive issues.   She noted issues such as not knowing how to get from point A to point B. He had chemotherapy for MM 2015 to 2016 and he felt he did worse with memory and related issues.     Organization skills are worse and he is more forgetful.  He noted more difficulty retaining what he reads and decreased attention at times.  He is an attorney.    I saw him July 2016 and felt he had mild cognitive impairment.    Since then he has seen Dr. Zelson who confirmed MCI.     Multiple Myeloma:   He has IgA kappa multiple myeloma and amyloidosis in late 2014 (diagnosed with amyloidosis first, then after BM with MM). He has seen Dr.Ennover [place] in [place] and has been referred to [name] and [facility] for opinions, as well. He was treated with weekly Velcade and oral Cytoxan and Decadron.     BM stem cell transplant was considered but not recommended.    He is currently not on   Mood:   He denies depression but feels more stressed.  His wife feels he is more depressed.     Sleep:   He does not snore or have sleep dysregulated breathing.    He sleeps well.   He gets 6-7 hours of sleep and wakes up for nocturia x 2 and takes Ambien if he has trouble falling back asleep.    Other:  He denies diabetes or thyroid disease.     He smoked about 2 ppd for 10 years many years ago.     The MRI report from 09/04/2014 showed nonspecific white matter  findings, likely due to chronic small vessel ischemic changes. The actual images were not available for review.  Montreal Cognitive Assessment  11/04/2015 09/26/2014  Visuospatial/ Executive (0/5) 5 5  Naming (0/3) 3 2  Attention: Read list of digits (0/2) 2 1  Attention: Read list of letters (0/1) 1 1  Attention: Serial 7 subtraction starting at 100 (0/3) 3 3  Language: Repeat phrase (0/2) 1 2  Language : Fluency (0/1) 1 1  Abstraction (0/2) 2 2  Delayed Recall (0/5) 2 3  Orientation (0/6) 6 6  Total 26 26  Adjusted Score (based on education) 26 26      REVIEW OF SYSTEMS: Constitutional: No fevers, chills, sweats, or change in appetite Eyes: No visual changes, double vision, eye pain Ear, nose and throat: Notes  hearing loss.  No ear pain, nasal congestion, sore throat Cardiovascular: No chest pain, palpitations Respiratory: No shortness of breath at rest or with exertion.   No wheezes GastrointestinaI: No nausea, vomiting, diarrhea, abdominal pain, fecal incontinence Genitourinary: Some urinary retention / BPH Musculoskeletal: No neck pain, back pain Integumentary: No rash, pruritus, skin lesions Neurological: as above Psychiatric: No depression at this time.  No anxiety Endocrine: No palpitations,   diaphoresis, change in appetite, change in weigh or increased thirst Hematologic/Lymphatic: He has multiple myeloma with amyloidosis. Allergic/Immunologic: No itchy/runny eyes, nasal congestion, recent allergic reactions, rashes  ALLERGIES: Allergies  Allergen Reactions  . Penicillins Swelling and Rash    HOME MEDICATIONS:  Current Outpatient Prescriptions:  .  ANDROGEL PUMP 20.25 MG/ACT (1.62%) GEL, Apply 3 application topically every morning. , Disp: , Rfl:  .  Cholecalciferol (VITAMIN D3 PO), Take 1 capsule by mouth daily., Disp: , Rfl:  .  Omega-3 Fatty Acids (FISH OIL PO), Take 1 capsule by mouth daily., Disp: , Rfl:  .  zolpidem (AMBIEN) 5 MG tablet, TAKE 1/2 TO 1  TABLET BY MOUTH AT BEDTIME AS NEEDED FOR SLEEP (Patient taking differently: TAKE 2.5-5 MG BY MOUTH AT BEDTIME AS NEEDED FOR SLEEP), Disp: 30 tablet, Rfl: 2 .  busPIRone (BUSPAR) 10 MG tablet, Take 1 tablet (10 mg total) by mouth 2 (two) times daily. (Patient not taking: Reported on 11/04/2015), Disp: 60 tablet, Rfl: 3  PAST MEDICAL HISTORY: Past Medical History:  Diagnosis Date  . Cancer (HCC) 2016   amylodosis, multiple myeloma  . Hyperlipidemia   . Nephrolithiasis   . Prostatitis    acute and chronic    PAST SURGICAL HISTORY: Past Surgical History:  Procedure Laterality Date  . APPENDECTOMY    . CARDIAC CATHETERIZATION N/A 06/13/2015   Procedure: Left Heart Cath and Coronary Angiography;  Surgeon: Jayadeep S Varanasi, MD;  Location: [facility];  Service: Cardiovascular;  Laterality: N/A;  . COLONOSCOPY  04/07/00  . ELBOW SURGERY  2003   right  . HERNIA REPAIR    . ROTATOR CUFF REPAIR      FAMILY HISTORY: Family History  Problem Relation Age of Onset  . CAD Brother 60  . Pancreatic cancer Father   . Parkinsonism Mother     SOCIAL HISTORY:  Social History   Social History  . Marital status: Married    Spouse name: N/A  . Number of children: N/A  . Years of education: N/A   Occupational History  . Not on file.   Social History Main Topics  . Smoking status: Former Smoker    Quit date: 03/15/1977  . Smokeless tobacco: Never Used     Comment: quit 35 years ago  . Alcohol use 0.0 oz/week     Comment: approx 1 drink/night  . Drug use: No  . Sexual activity: Yes    Birth control/ protection: None   Other Topics Concern  . Not on file   Social History Narrative  . No narrative on file     PHYSICAL EXAM  Vitals:   11/04/15 0943  BP: 136/76  Pulse: 78  Resp: 16  Weight: 158 lb (71.7 kg)  Height: 5' 8" (1.727 m)    Body mass index is 24.02 kg/m.   General: The patient is well-developed and well-nourished and in no acute  distress  Neurologic Exam  Mental status: The patient is alert and oriented x 3 at the time of the examination. The patient has redued normal recent and remote memory, with a reduced normal attention span and concentration ability.   Speech is normal.   (SEE MOCA TEST RESULTS ABOVE)  Cranial nerves: Extraocular movements are full.      Facial strength is normal.  Trapezius and sternocleidomastoid strength is normal. No dysarthria is noted.  The tongue is midline, and the patient has symmetric elevation of the soft palate. No obvious hearing deficits are noted.    Motor:  Muscle bulk is normal.   Tone is normal. Strength is  5 / 5 in all 4 extremities.   Sensory: Sensory testing is intact to pinprick, soft touch and vibration sensation in all 4 extremities.  Coordination: Cerebellar testing reveals good finger-nose-finger and heel-to-shin bilaterally.  Gait and station: Station is normal.   Gait is normal. Tandem gait is normal.   Reflexes: Deep tendon reflexes are symmetric and normal bilaterally.      DIAGNOSTIC DATA (LABS, IMAGING, TESTING) - I reviewed patient records, labs, notes, testing and imaging myself where available.  Lab Results  Component Value Date   WBC 5.3 06/10/2015   HGB 15.9 06/10/2015   HCT 45.9 06/10/2015   MCV 94.8 06/10/2015   PLT 255 06/10/2015      Component Value Date/Time   NA 142 06/10/2015 1330   NA 139 04/03/2015 1445   NA 141 07/29/2014 1404   NA 141 04/17/2013 1044   K 4.5 06/10/2015 1330   K 3.8 04/03/2015 1445   K 3.5 07/29/2014 1404   K 4.1 04/17/2013 1044   CL 105 06/10/2015 1330   CL 106 04/03/2015 1445   CL 102 07/29/2014 1404   CO2 28 06/10/2015 1330   CO2 23 04/03/2015 1445   CO2 30 07/29/2014 1404   CO2 28 04/17/2013 1044   GLUCOSE 91 06/10/2015 1330   GLUCOSE 129 (H) 07/29/2014 1404   BUN 17 06/10/2015 1330   BUN 18 04/03/2015 1445   BUN 16 07/29/2014 1404   BUN 17.3 04/17/2013 1044   CREATININE 0.99 06/10/2015 1330    CREATININE 1.1 04/17/2013 1044   CALCIUM 9.2 06/10/2015 1330   CALCIUM 9.4 04/03/2015 1445   CALCIUM 9.4 07/29/2014 1404   CALCIUM 9.8 04/17/2013 1044   PROT 7.1 04/03/2015 1445   PROT 7.4 07/29/2014 1404   PROT 7.6 04/17/2013 1044   ALBUMIN 4.2 04/03/2015 1445   ALBUMIN 4.1 04/17/2013 1044   AST 16 04/03/2015 1445   AST 20 07/29/2014 1404   AST 16 04/17/2013 1044   ALT 14 04/03/2015 1445   ALT 20 07/29/2014 1404   ALT 17 04/17/2013 1044   ALKPHOS 80 04/03/2015 1445   ALKPHOS 79 07/29/2014 1404   ALKPHOS 89 04/17/2013 1044   BILITOT 0.3 04/03/2015 1445   BILITOT 0.80 07/29/2014 1404   BILITOT 0.45 04/17/2013 1044   GFRNONAA 86 04/03/2015 1445   GFRAA 100 04/03/2015 1445   Lab Results  Component Value Date   CHOL 189 03/29/2011   HDL 45.30 03/29/2011   LDLCALC 126 (H) 03/29/2011   TRIG 87.0 03/29/2011   CHOLHDL 4 03/29/2011       ASSESSMENT AND PLAN  Memory disorder  Avitaminosis D  Multiple myeloma in remission ([place])    1.   Foltx and Vit D.    81 mg ASA 2.   consider adding donepezil 3.   He prefers not to take an antideoressant at this time 4.   Letter for Bar  45 minute face-to-face evaluation with greater than one half of the time counseling and coordinating care for the memory loss.    Richard A. Felecia Shelling, MD, PhD 08/29/735, 10:62 AM Certified in Neurology, Clinical Neurophysiology, Sleep Medicine, Pain Medicine and Neuroimaging  [facility] [address] [place], [place] [address] [phone]

## 2015-11-06 DIAGNOSIS — H903 Sensorineural hearing loss, bilateral: Secondary | ICD-10-CM | POA: Diagnosis not present

## 2015-11-15 DIAGNOSIS — S233XXA Sprain of ligaments of thoracic spine, initial encounter: Secondary | ICD-10-CM | POA: Diagnosis not present

## 2015-12-08 ENCOUNTER — Telehealth: Payer: Self-pay | Admitting: Neurology

## 2015-12-08 NOTE — Telephone Encounter (Signed)
LMTC./fim 

## 2015-12-08 NOTE — Telephone Encounter (Signed)
Patient called requesting to speak to Faith, wants to ask who requested MRI that was done 09/04/14. Please call [phone].

## 2015-12-09 NOTE — Telephone Encounter (Signed)
Pt returned call  [phone] Pt would like to know who ordered the MRI and the reason why, what the date ordered.

## 2015-12-09 NOTE — Telephone Encounter (Signed)
I have spoken with William Barron this morning.  In looking at the MRI done in May 2016, I do not see an ordering physician's name.  I believe MRI may have been ordered by his pcp./fim

## 2016-01-04 ENCOUNTER — Other Ambulatory Visit: Payer: Self-pay | Admitting: Neurology

## 2016-01-07 ENCOUNTER — Other Ambulatory Visit: Payer: Self-pay | Admitting: Neurology

## 2016-01-14 ENCOUNTER — Telehealth: Payer: Self-pay | Admitting: Neurology

## 2016-01-14 NOTE — Telephone Encounter (Signed)
I have spoken with pt. this morning and explained that vit. d 50,000iu was only a short term rx.  Now that he has completed course, he does not need to r/f rx.  He will continue 2,000iu daily otc vit. d/fim

## 2016-01-14 NOTE — Telephone Encounter (Signed)
Patient called to request refill of Cholecalciferol 50000 units capsule to [facility]

## 2016-01-29 DIAGNOSIS — C9000 Multiple myeloma not having achieved remission: Secondary | ICD-10-CM | POA: Diagnosis not present

## 2016-01-29 DIAGNOSIS — Z8579 Personal history of other malignant neoplasms of lymphoid, hematopoietic and related tissues: Secondary | ICD-10-CM | POA: Diagnosis not present

## 2016-02-02 ENCOUNTER — Telehealth: Payer: Self-pay | Admitting: Neurology

## 2016-02-02 NOTE — Telephone Encounter (Signed)
Patient is calling to ask if Dr. 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 I told patient Dr. Felecia Shelling is out of the office today. Please call and discuss.

## 2016-02-02 NOTE — Telephone Encounter (Signed)
I have spoken with Mr. William Barron this afternoon.  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 will be calling to confirm his dx. of mild. memory impairment.  Sts.  he brought by a signed release allowing RAS to talk to the attorney with the [employer]. about 2 mos. ago.  I do not see one scanned into EMR.  William Barron sts. that after Thanksgiving, he will bring another signed copy by our office, and request it be given to me.  If RAS receives a call from the [facility] [employer] [facility] before then, I will call pt./fim

## 2016-02-03 ENCOUNTER — Other Ambulatory Visit (HOSPITAL_COMMUNITY): Payer: Self-pay

## 2016-02-27 ENCOUNTER — Other Ambulatory Visit (HOSPITAL_COMMUNITY): Payer: Self-pay

## 2016-03-16 DIAGNOSIS — L821 Other seborrheic keratosis: Secondary | ICD-10-CM | POA: Diagnosis not present

## 2016-03-16 DIAGNOSIS — Z85820 Personal history of malignant melanoma of skin: Secondary | ICD-10-CM | POA: Diagnosis not present

## 2016-03-16 DIAGNOSIS — B351 Tinea unguium: Secondary | ICD-10-CM | POA: Diagnosis not present

## 2016-03-16 DIAGNOSIS — D1801 Hemangioma of skin and subcutaneous tissue: Secondary | ICD-10-CM | POA: Diagnosis not present

## 2016-03-17 ENCOUNTER — Other Ambulatory Visit: Payer: Self-pay

## 2016-03-17 ENCOUNTER — Ambulatory Visit (HOSPITAL_COMMUNITY): Payer: Medicare Other | Attending: Cardiovascular Disease

## 2016-03-17 DIAGNOSIS — I429 Cardiomyopathy, unspecified: Secondary | ICD-10-CM

## 2016-03-17 DIAGNOSIS — I351 Nonrheumatic aortic (valve) insufficiency: Secondary | ICD-10-CM | POA: Insufficient documentation

## 2016-03-23 ENCOUNTER — Telehealth: Payer: Self-pay | Admitting: Cardiology

## 2016-03-23 NOTE — Telephone Encounter (Signed)
Returning your call. °

## 2016-03-24 DIAGNOSIS — E291 Testicular hypofunction: Secondary | ICD-10-CM | POA: Diagnosis not present

## 2016-03-24 NOTE — Telephone Encounter (Signed)
Notes Recorded by Minus Breeding, MD on 03/17/2016 at 4:11 PM EST EF is low normal and unchanged.  There is some pulmonary valve regurgitation.  However, there is no evidence of pulmonary HTN or RV failure.  I will follow this with repeat echo in one year most likely.  Please make sure that he has follow up in March.  Call Mr. Canal with the results and send results to [name], MD

## 2016-03-24 NOTE — Telephone Encounter (Signed)
Called pt. No answer, goes to VM. Left msg advising he may call me back today (Nya in clinic)

## 2016-03-24 NOTE — Telephone Encounter (Signed)
Discussed w patient, he's voiced understanding of results and recommendations. While on the phone, scheduled him for his return yearly OV. He's aware to call again prior to visit if questions or concerns.

## 2016-03-24 NOTE — Telephone Encounter (Signed)
Follow up  ° ° ° °Returning call back to nurse  °

## 2016-03-24 NOTE — Telephone Encounter (Signed)
New message ° °Pt verbalized that he is calling for the rn °

## 2016-03-25 ENCOUNTER — Other Ambulatory Visit: Payer: Self-pay | Admitting: Nurse Practitioner

## 2016-05-05 ENCOUNTER — Encounter: Payer: Self-pay | Admitting: Neurology

## 2016-05-05 ENCOUNTER — Ambulatory Visit (INDEPENDENT_AMBULATORY_CARE_PROVIDER_SITE_OTHER): Payer: Medicare Other | Admitting: Neurology

## 2016-05-05 VITALS — BP 139/83 | HR 71 | Resp 18 | Ht 68.0 in | Wt 160.5 lb

## 2016-05-05 DIAGNOSIS — R413 Other amnesia: Secondary | ICD-10-CM | POA: Diagnosis not present

## 2016-05-05 DIAGNOSIS — G4700 Insomnia, unspecified: Secondary | ICD-10-CM | POA: Insufficient documentation

## 2016-05-05 DIAGNOSIS — C9001 Multiple myeloma in remission: Secondary | ICD-10-CM

## 2016-05-05 DIAGNOSIS — R5382 Chronic fatigue, unspecified: Secondary | ICD-10-CM

## 2016-05-05 MED ORDER — DONEPEZIL HCL 5 MG PO TABS: 5.0000 mg | ORAL_TABLET | Freq: Every day | ORAL | 5 refills | Status: DC

## 2016-05-05 NOTE — Progress Notes (Signed)
[facility]  PATIENT: William Barron DOB: 03-05-1946  REFERRING DOCTOR OR PCP:  William Barron SOURCE: patient, records in EMR and from [place], MRI report and lab results  _________________________________   HISTORICAL  CHIEF COMPLAINT:  Chief Complaint  Patient presents with  . Memory Loss    Sts. he "can just tell that my condition is progressing slowly." /fim    HISTORY OF PRESENT ILLNESS:  William Barron is a 71 yo man with mild cognitive impairment.   He feels he is doing a little worse.   He retired from Sports coach.   We discussed trying to find a hobby or activity to keep him more active.  They recently bought a beach house and remodeling is keeping him active.    Memory:   He notes doing mildly worse.    He plays cars and makes more errors.    He has trouble with 2 step instructions.  In 2012, his wife started to notice mild cognitive issues.   She noted issues such as not knowing how to get from point A to point B. He had chemotherapy for MM 2015 to 2016 and he felt he did worse with memory and related issues.     Organization skills are worse and he is more forgetful.  He noted more difficulty retaining what he reads and decreased attention at times.  He is an Forensic psychologist.    I saw him July 2016 and felt he had mild cognitive impairment.    Since then he has seen William Barron who confirmed MCI.     He takes 2000 U otc  Multiple Myeloma: He follows up regularly with hematology. He is stable.   He has IgA kappa multiple myeloma and amyloidosis in late 2014 (diagnosed with amyloidosis first, then after BM with MM). He has seen [facility] [place] and has been referred to [facility] and [facility] for opinions, as well. He was treated with weekly Velcade and oral Cytoxan and Decadron.   He is currently not on medication  Mood:   He denies depression and notes less stress than less visit and no anxiety.Marland Kitchen  His wife feels he is more depressed.     Sleep:  Sleep is  worse off Ambien.   With it, he gets a better night sleep but still wakes up 2 x nightly for nocturia.Marland Kitchen   He does not snore or have sleep dysregulated breathing.    He sleeps well.      Other:  He denies diabetes or thyroid disease.     He smoked about 2 ppd for 10 years many years ago.     The MRI report from 09/04/2014 showed nonspecific white matter findings, likely due to chronic small vessel ischemic changes. The actual images were not available for review.   Montreal Cognitive Assessment  11/04/2015 09/26/2014  Visuospatial/ Executive (0/5) 5 5  Naming (0/3) 3 2  Attention: Read list of digits (0/2) 2 1  Attention: Read list of letters (0/1) 1 1  Attention: Serial 7 subtraction starting at 100 (0/3) 3 3  Language: Repeat phrase (0/2) 1 2  Language : Fluency (0/1) 1 1  Abstraction (0/2) 2 2  Delayed Recall (0/5) 2 3  Orientation (0/6) 6 6  Total 26 26  Adjusted Score (based on education) 26 26      REVIEW OF SYSTEMS: Constitutional: No fevers, chills, sweats, or change in appetite Eyes: No visual changes, double vision, eye pain Ear, nose  and throat: Notes  hearing loss.  No ear pain, nasal congestion, sore throat Cardiovascular: No chest pain, palpitations Respiratory: No shortness of breath at rest or with exertion.   No wheezes GastrointestinaI: No nausea, vomiting, diarrhea, abdominal pain, fecal incontinence Genitourinary: Some urinary retention / BPH Musculoskeletal: No neck pain, back pain Integumentary: No rash, pruritus, skin lesions Neurological: as above Psychiatric: No depression at this time.  No anxiety Endocrine: No palpitations, diaphoresis, change in appetite, change in weigh or increased thirst Hematologic/Lymphatic: He has multiple myeloma with amyloidosis. Allergic/Immunologic: No itchy/runny eyes, nasal congestion, recent allergic reactions, rashes  ALLERGIES: Allergies  Allergen Reactions  . Penicillins Swelling and Rash    HOME  MEDICATIONS:  Current Outpatient Prescriptions:  .  ANDROGEL PUMP 20.25 MG/ACT (1.62%) GEL, Apply 3 application topically every morning. , Disp: , Rfl:  .  Cholecalciferol (VITAMIN D3 PO), Take 1 capsule by mouth daily., Disp: , Rfl:  .  Cholecalciferol 50000 units capsule, One po weekly x 8 weeks, Disp: 8 capsule, Rfl: 0 .  Omega-3 Fatty Acids (FISH OIL PO), Take 1 capsule by mouth daily., Disp: , Rfl:  .  zolpidem (AMBIEN) 5 MG tablet, Take 1 tablet (5 mg total) by mouth at bedtime as needed for sleep., Disp: 30 tablet, Rfl: 0 .  busPIRone (BUSPAR) 10 MG tablet, Take 1 tablet (10 mg total) by mouth 2 (two) times daily. (Patient not taking: Reported on 11/04/2015), Disp: 60 tablet, Rfl: 3 .  donepezil (ARICEPT) 5 MG tablet, Take 1 tablet (5 mg total) by mouth at bedtime., Disp: 30 tablet, Rfl: 5 .  folic acid-pyridoxine-cyancobalamin (FOLTX) 2.5-25-2 MG TABS tablet, Take 1 tablet by mouth daily. (Patient not taking: Reported on 05/05/2016), Disp: 30 each, Rfl: 11  PAST MEDICAL HISTORY: Past Medical History:  Diagnosis Date  . Cancer ([place]) 2016   amylodosis, multiple myeloma  . Hyperlipidemia   . Nephrolithiasis   . Prostatitis    acute and chronic    PAST SURGICAL HISTORY: Past Surgical History:  Procedure Laterality Date  . APPENDECTOMY    . CARDIAC CATHETERIZATION N/A 06/13/2015   Procedure: Left Heart Cath and Coronary Angiography;  Surgeon: William Booze, MD;  Location: [facility];  Service: Cardiovascular;  Laterality: N/A;  . COLONOSCOPY  04/07/00  . ELBOW SURGERY  2003   right  . HERNIA REPAIR    . ROTATOR CUFF REPAIR      FAMILY HISTORY: Family History  Problem Relation Age of Onset  . CAD Brother 34  . Pancreatic cancer Father   . Parkinsonism Mother     SOCIAL HISTORY:  Social History   Social History  . Marital status: Married    Spouse name: N/A  . Number of children: N/A  . Years of education: N/A   Occupational History  . Not on file.    Social History Main Topics  . Smoking status: Former Smoker    Quit date: 03/15/1977  . Smokeless tobacco: Never Used     Comment: quit 35 years ago  . Alcohol use 0.0 oz/week     Comment: approx 1 drink/night  . Drug use: No  . Sexual activity: Yes    Birth control/ protection: None   Other Topics Concern  . Not on file   Social History Narrative  . No narrative on file     PHYSICAL EXAM  Vitals:   05/05/16 0939  BP: 139/83  Pulse: 71  Resp: 18  Weight: 160 lb 8 oz (  72.8 kg)  Height: 5' 8" (1.727 m)    Body mass index is 24.4 kg/m.   General: The patient is well-developed and well-nourished and in no acute distress  Neurologic Exam  Mental status: The patient is alert and oriented x 3 at the time of the examination. Today, the patient has normal recent and remote memory, with a reduced normal attention span and concentration ability.  He made some errors continuing a geometric pattern. Clock numbers were appropriately spaced. Speech is normal.     Cranial nerves: Extraocular movements are full.      Facial strength is normal.  Trapezius and sternocleidomastoid strength is normal. No dysarthria is noted.  The tongue is midline, and the patient has symmetric elevation of the soft palate. No obvious hearing deficits are noted.  Motor:  Muscle bulk is normal.   Tone is normal. Strength is  5 / 5 in all 4 extremities.   Sensory: Sensory testing is intact to pinprick, soft touch and vibration sensation in all 4 extremities.  Coordination: Cerebellar testing reveals good finger-nose-finger and heel-to-shin bilaterally.  Gait and station: Station is normal.   Gait is normal. Tandem gait is normal.   Reflexes: Deep tendon reflexes are symmetric and normal bilaterally.      DIAGNOSTIC DATA (LABS, IMAGING, TESTING) - I reviewed patient records, labs, notes, testing and imaging myself where available.  Lab Results  Component Value Date   WBC 5.3 06/10/2015   HGB 15.9  06/10/2015   HCT 45.9 06/10/2015   MCV 94.8 06/10/2015   PLT 255 06/10/2015      Component Value Date/Time   NA 142 06/10/2015 1330   NA 139 04/03/2015 1445   NA 141 07/29/2014 1404   NA 141 04/17/2013 1044   K 4.5 06/10/2015 1330   K 3.8 04/03/2015 1445   K 3.5 07/29/2014 1404   K 4.1 04/17/2013 1044   CL 105 06/10/2015 1330   CL 106 04/03/2015 1445   CL 102 07/29/2014 1404   CO2 28 06/10/2015 1330   CO2 23 04/03/2015 1445   CO2 30 07/29/2014 1404   CO2 28 04/17/2013 1044   GLUCOSE 91 06/10/2015 1330   GLUCOSE 129 (H) 07/29/2014 1404   BUN 17 06/10/2015 1330   BUN 18 04/03/2015 1445   BUN 16 07/29/2014 1404   BUN 17.3 04/17/2013 1044   CREATININE 0.99 06/10/2015 1330   CREATININE 1.1 04/17/2013 1044   CALCIUM 9.2 06/10/2015 1330   CALCIUM 9.4 04/03/2015 1445   CALCIUM 9.4 07/29/2014 1404   CALCIUM 9.8 04/17/2013 1044   PROT 7.1 04/03/2015 1445   PROT 7.4 07/29/2014 1404   PROT 7.6 04/17/2013 1044   ALBUMIN 4.2 04/03/2015 1445   ALBUMIN 4.1 04/17/2013 1044   AST 16 04/03/2015 1445   AST 20 07/29/2014 1404   AST 16 04/17/2013 1044   ALT 14 04/03/2015 1445   ALT 20 07/29/2014 1404   ALT 17 04/17/2013 1044   ALKPHOS 80 04/03/2015 1445   ALKPHOS 79 07/29/2014 1404   ALKPHOS 89 04/17/2013 1044   BILITOT 0.3 04/03/2015 1445   BILITOT 0.80 07/29/2014 1404   BILITOT 0.45 04/17/2013 1044   GFRNONAA 86 04/03/2015 1445   GFRAA 100 04/03/2015 1445   Lab Results  Component Value Date   CHOL 189 03/29/2011   HDL 45.30 03/29/2011   LDLCALC 126 (H) 03/29/2011   TRIG 87.0 03/29/2011   CHOLHDL 4 03/29/2011       ASSESSMENT AND PLAN  Memory  disorder  Multiple myeloma in remission (HCC)  Chronic fatigue  Insomnia, unspecified type   1.   Advised to take Folic acid and Vit D.    81 mg ASA 2.   Add donepezil 5 mg and titrate to 10 mg if tolerated.   He has mild cognitive impairment and we discussed that he is at higher risk of developing Alzheimer's disease.    We are participating in an early AD drug study and I will forward his contact information to Research officer, political party. 3.   RTC 6 months or sooner if new or worsening neurologic issues.  Duquan Gillooly A. Felecia Shelling, MD, PhD 9/48/5462, 7:03 PM Certified in Neurology, Clinical Neurophysiology, Sleep Medicine, Pain Medicine and Neuroimaging  [facility] [address] [place], [place] [address] [phone]

## 2016-05-25 NOTE — Progress Notes (Signed)
Cardiology Office Note   Date:  05/27/2016   ID:  William Barron, DOB 13-Feb-1946, MRN [id number]  PCP:  [name], MD  Cardiologist:   Minus Breeding, MD   Chief Complaint  Patient presents with  . Coronary Calcium      History of Present Illness: William Barron is a 71 y.o. male who presents for evaluation of coronary calcium.  He had an abnormal POET (Plain Old Exercise Treadmill) and significant calcium in his coronaries.  However, cath demonstrated mild CAD. He had a recent follow up echo.   He had some moderate PR but this is unchanged from previous.  He has no pulmonary HTN and normal RV/LV function.  He is exercising routinely and has no symptoms.  The patient denies any new symptoms such as chest discomfort, neck or arm discomfort. There has been no new shortness of breath, PND or orthopnea. There have been no reported palpitations, presyncope or syncope.   Past Medical History:  Diagnosis Date  . Cancer ([place]) 2016   amylodosis, multiple myeloma  . Hyperlipidemia   . Nephrolithiasis   . Prostatitis    acute and chronic    Past Surgical History:  Procedure Laterality Date  . APPENDECTOMY    . CARDIAC CATHETERIZATION N/A 06/13/2015   Procedure: Left Heart Cath and Coronary Angiography;  Surgeon: Jettie Booze, MD;  Location: [facility];  Service: Cardiovascular;  Laterality: N/A;  . COLONOSCOPY  04/07/00  . ELBOW SURGERY  2003   right  . HERNIA REPAIR    . ROTATOR CUFF REPAIR       Current Outpatient Prescriptions  Medication Sig Dispense Refill  . ANDROGEL PUMP 20.25 MG/ACT (1.62%) GEL Apply 3 application topically every morning.     . busPIRone (BUSPAR) 10 MG tablet Take 1 tablet (10 mg total) by mouth 2 (two) times daily. 60 tablet 3  . Cholecalciferol (VITAMIN D3 PO) Take 1 capsule by mouth daily.    Marland Kitchen donepezil (ARICEPT) 5 MG tablet Take 1 tablet (5 mg total) by mouth at bedtime. 30 tablet 5  . folic  acid-pyridoxine-cyancobalamin (FOLTX) 2.5-25-2 MG TABS tablet Take 1 tablet by mouth daily. 30 each 11  . Omega-3 Fatty Acids (FISH OIL PO) Take 2 capsules by mouth daily.     Marland Kitchen zolpidem (AMBIEN) 5 MG tablet Take 1 tablet (5 mg total) by mouth at bedtime as needed for sleep. 30 tablet 0   No current facility-administered medications for this visit.     Allergies:   Penicillins    ROS:  Please see the history of present illness.   Otherwise, review of systems are positive for ED   All other systems are reviewed and negative.    PHYSICAL EXAM: VS:  BP 132/78   Pulse 69   Ht _0  (1.727 m)   Wt 160 lb (72.6 kg)   BMI 24.33 kg/m  , BMI Body mass index is 24.33 kg/m. GENERAL:  Well appearing NECK:  No jugular venous distention, waveform within normal limits, carotid upstroke brisk and symmetric, no bruits, no thyromegaly LUNGS:  Clear to auscultation bilaterally BACK:  No CVA tenderness CHEST:  Unremarkable HEART:  PMI not displaced or sustained,S1 and S2 within normal limits, no S3, no S4, no clicks, no rubs, no murmurs ABD:  Flat, positive bowel sounds normal in frequency in pitch, no bruits, no rebound, no guarding, no midline pulsatile mass, no hepatomegaly, no splenomegaly EXT:  2 plus pulses throughout,  no edema, no cyanosis no clubbing    EKG:  EKG is not ordered today. Sinus rhythm, rate 69, axis within normal limits, intervals within normal limits, no acute ST-T wave changes.  Recent Labs: 06/10/2015: BUN 17; Creat 0.99; Hemoglobin 15.9; Platelets 255; Potassium 4.5; Sodium 142; TSH 1.81      Wt Readings from Last 3 Encounters:  05/26/16 160 lb (72.6 kg)  05/05/16 160 lb 8 oz (72.8 kg)  11/04/15 158 lb (71.7 kg)      Other studies Reviewed: Additional studies/ records that were reviewed today include: None Review of the above records demonstrates:      ASSESSMENT AND PLAN:  ELEVATED CORONARY CALCIUM:  He has had no new symptoms and is very active.  He had  nonobstructive disease on cath last year.  No change in therapy is indicated.  He can follow up with primary risk reduction.   DYPSNEA:  This is no longer an issue.    PULMONIC REGURGITATION:   This was unchanged without evidence of pulmonary HTN.  I reviewed the the images personally.   REDUCED EF:  This was mildly low but stable on recent echo.  .  ED:  He wants to use Trimix for this.  Given the fact that he has no obstructive CAD and no symptoms there is no contraindication to these injections.    Current medicines are reviewed at length with the patient today.  The patient does not have concerns regarding medicines.  The following changes have been made:  no change  Labs/ tests ordered today include:    Echo for next year.   Orders Placed This Encounter  Procedures  . EKG 12-Lead  . ECHOCARDIOGRAM COMPLETE     Disposition:   FU with me in one year.     Signed, Minus Breeding, MD  05/27/2016 7:30 AM    [place]

## 2016-05-26 ENCOUNTER — Ambulatory Visit (INDEPENDENT_AMBULATORY_CARE_PROVIDER_SITE_OTHER): Payer: Medicare Other | Admitting: Cardiology

## 2016-05-26 ENCOUNTER — Encounter: Payer: Self-pay | Admitting: Cardiology

## 2016-05-26 VITALS — BP 132/78 | HR 69 | Ht 68.0 in | Wt 160.0 lb

## 2016-05-26 DIAGNOSIS — I428 Other cardiomyopathies: Secondary | ICD-10-CM | POA: Diagnosis not present

## 2016-05-26 DIAGNOSIS — R931 Abnormal findings on diagnostic imaging of heart and coronary circulation: Secondary | ICD-10-CM | POA: Diagnosis not present

## 2016-05-26 DIAGNOSIS — N529 Male erectile dysfunction, unspecified: Secondary | ICD-10-CM

## 2016-05-26 DIAGNOSIS — I371 Nonrheumatic pulmonary valve insufficiency: Secondary | ICD-10-CM

## 2016-05-26 DIAGNOSIS — I2510 Atherosclerotic heart disease of native coronary artery without angina pectoris: Secondary | ICD-10-CM | POA: Diagnosis not present

## 2016-05-26 NOTE — Patient Instructions (Signed)
Medication Instructions:  Continue current medications  Labwork: None Ordered  Testing/Procedures: Your physician has requested that you have an echocardiogram in 1 Year. Echocardiography is a painless test that uses sound waves to create images of your heart. It provides your doctor with information about the size and shape of your heart and how well your heart's chambers and valves are working. This procedure takes approximately one hour. There are no restrictions for this procedure.   Follow-Up: Your physician wants you to follow-up in: 1 Year. You will receive a reminder letter in the mail two months in advance. If you don't receive a letter, please call our office to schedule the follow-up appointment.   Any Other Special Instructions Will Be Listed Below (If Applicable).     If you need a refill on your cardiac medications before your next appointment, please call your pharmacy.   

## 2016-05-27 ENCOUNTER — Encounter: Payer: Self-pay | Admitting: Cardiology

## 2016-05-27 DIAGNOSIS — E291 Testicular hypofunction: Secondary | ICD-10-CM | POA: Diagnosis not present

## 2016-06-02 DIAGNOSIS — N401 Enlarged prostate with lower urinary tract symptoms: Secondary | ICD-10-CM | POA: Diagnosis not present

## 2016-06-02 DIAGNOSIS — E291 Testicular hypofunction: Secondary | ICD-10-CM | POA: Diagnosis not present

## 2016-06-02 DIAGNOSIS — R351 Nocturia: Secondary | ICD-10-CM | POA: Diagnosis not present

## 2016-06-02 DIAGNOSIS — N5201 Erectile dysfunction due to arterial insufficiency: Secondary | ICD-10-CM | POA: Diagnosis not present

## 2016-07-09 DIAGNOSIS — Z125 Encounter for screening for malignant neoplasm of prostate: Secondary | ICD-10-CM | POA: Diagnosis not present

## 2016-07-09 DIAGNOSIS — I2510 Atherosclerotic heart disease of native coronary artery without angina pectoris: Secondary | ICD-10-CM | POA: Diagnosis not present

## 2016-07-09 DIAGNOSIS — R413 Other amnesia: Secondary | ICD-10-CM | POA: Diagnosis not present

## 2016-07-13 DIAGNOSIS — N400 Enlarged prostate without lower urinary tract symptoms: Secondary | ICD-10-CM | POA: Diagnosis not present

## 2016-07-13 DIAGNOSIS — Z0000 Encounter for general adult medical examination without abnormal findings: Secondary | ICD-10-CM | POA: Diagnosis not present

## 2016-07-13 DIAGNOSIS — Z6826 Body mass index (BMI) 26.0-26.9, adult: Secondary | ICD-10-CM | POA: Diagnosis not present

## 2016-07-13 DIAGNOSIS — R413 Other amnesia: Secondary | ICD-10-CM | POA: Diagnosis not present

## 2016-07-13 DIAGNOSIS — E298 Other testicular dysfunction: Secondary | ICD-10-CM | POA: Diagnosis not present

## 2016-07-13 DIAGNOSIS — Z1389 Encounter for screening for other disorder: Secondary | ICD-10-CM | POA: Diagnosis not present

## 2016-07-13 DIAGNOSIS — Z23 Encounter for immunization: Secondary | ICD-10-CM | POA: Diagnosis not present

## 2016-07-13 DIAGNOSIS — C9001 Multiple myeloma in remission: Secondary | ICD-10-CM | POA: Diagnosis not present

## 2016-07-13 DIAGNOSIS — I2510 Atherosclerotic heart disease of native coronary artery without angina pectoris: Secondary | ICD-10-CM | POA: Diagnosis not present

## 2016-07-20 DIAGNOSIS — M76821 Posterior tibial tendinitis, right leg: Secondary | ICD-10-CM | POA: Diagnosis not present

## 2016-07-20 DIAGNOSIS — M722 Plantar fascial fibromatosis: Secondary | ICD-10-CM | POA: Diagnosis not present

## 2016-07-20 DIAGNOSIS — M76822 Posterior tibial tendinitis, left leg: Secondary | ICD-10-CM | POA: Diagnosis not present

## 2016-07-28 DIAGNOSIS — C9000 Multiple myeloma not having achieved remission: Secondary | ICD-10-CM | POA: Diagnosis not present

## 2016-08-04 DIAGNOSIS — C9000 Multiple myeloma not having achieved remission: Secondary | ICD-10-CM | POA: Diagnosis not present

## 2016-09-03 DIAGNOSIS — R972 Elevated prostate specific antigen [PSA]: Secondary | ICD-10-CM | POA: Diagnosis not present

## 2016-09-06 DIAGNOSIS — L821 Other seborrheic keratosis: Secondary | ICD-10-CM | POA: Diagnosis not present

## 2016-09-06 DIAGNOSIS — D225 Melanocytic nevi of trunk: Secondary | ICD-10-CM | POA: Diagnosis not present

## 2016-09-06 DIAGNOSIS — Z85820 Personal history of malignant melanoma of skin: Secondary | ICD-10-CM | POA: Diagnosis not present

## 2016-09-06 DIAGNOSIS — D1801 Hemangioma of skin and subcutaneous tissue: Secondary | ICD-10-CM | POA: Diagnosis not present

## 2016-10-12 ENCOUNTER — Other Ambulatory Visit: Payer: Self-pay | Admitting: Hematology & Oncology

## 2016-11-05 DIAGNOSIS — G250 Essential tremor: Secondary | ICD-10-CM | POA: Diagnosis not present

## 2016-11-05 DIAGNOSIS — R413 Other amnesia: Secondary | ICD-10-CM | POA: Diagnosis not present

## 2016-12-08 DIAGNOSIS — N401 Enlarged prostate with lower urinary tract symptoms: Secondary | ICD-10-CM | POA: Diagnosis not present

## 2016-12-08 DIAGNOSIS — R351 Nocturia: Secondary | ICD-10-CM | POA: Diagnosis not present

## 2016-12-08 DIAGNOSIS — R972 Elevated prostate specific antigen [PSA]: Secondary | ICD-10-CM | POA: Diagnosis not present

## 2016-12-08 DIAGNOSIS — N5201 Erectile dysfunction due to arterial insufficiency: Secondary | ICD-10-CM | POA: Diagnosis not present

## 2016-12-08 DIAGNOSIS — E291 Testicular hypofunction: Secondary | ICD-10-CM | POA: Diagnosis not present

## 2017-01-03 ENCOUNTER — Ambulatory Visit (INDEPENDENT_AMBULATORY_CARE_PROVIDER_SITE_OTHER): Payer: Medicare Other | Admitting: Neurology

## 2017-01-03 ENCOUNTER — Encounter: Payer: Self-pay | Admitting: Neurology

## 2017-01-03 VITALS — BP 122/64 | HR 73 | Resp 18 | Ht 68.0 in | Wt 158.0 lb

## 2017-01-03 DIAGNOSIS — G4700 Insomnia, unspecified: Secondary | ICD-10-CM

## 2017-01-03 DIAGNOSIS — R931 Abnormal findings on diagnostic imaging of heart and coronary circulation: Secondary | ICD-10-CM | POA: Diagnosis not present

## 2017-01-03 DIAGNOSIS — C9001 Multiple myeloma in remission: Secondary | ICD-10-CM | POA: Diagnosis not present

## 2017-01-03 DIAGNOSIS — R413 Other amnesia: Secondary | ICD-10-CM

## 2017-01-03 DIAGNOSIS — F418 Other specified anxiety disorders: Secondary | ICD-10-CM | POA: Diagnosis not present

## 2017-01-03 MED ORDER — BUSPIRONE HCL 10 MG PO TABS: 10.0000 mg | ORAL_TABLET | Freq: Two times a day (BID) | ORAL | 3 refills | Status: DC

## 2017-01-03 NOTE — Progress Notes (Signed)
[facility]  PATIENT: William Barron DOB: February 28, 1946  REFERRING DOCTOR OR PCP:  [name] SOURCE: patient, records in EMR and from [place], MRI report and lab results  _________________________________   HISTORICAL  CHIEF COMPLAINT:  Chief Complaint  Patient presents with  . Memory Loss    Has completely retired--feels memory may be some better due to decreased stress.  Not taking Aricept; would like to see if RAS wants him to restart this/fim    HISTORY OF PRESENT ILLNESS:  William Barron is a 71 yo man with mild cognitive impairment.     Update 01/03/2017:   He feels he has been stable since the last visit. 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  He notes some memory lapses but no worse than the last visit.  He is not having any difficulty driving and is not forgetting to do things around the house. He feels his life is less stressful and that has helped the anxiety. He is no longer on BuSpar.. He does have a trial where he is a witness coming up soon.    He is exercising 4-5 days a week .    Mild cognitive impairment was shown on the formal neurocognitive testing. Imaging studies showed chronic microvascular ischemic change  His multiple myeloma appears in remission (followed at [facility]).   He has no new health issues.     ___________________________________________ From 05/05/2016:   He feels he is doing a little worse.   He retired from Sports coach.   We discussed trying to find a hobby or activity to keep him more active.  They recently bought a beach house and remodeling is keeping him active.    Memory:   He notes doing mildly worse.    He plays cars and makes more errors.    He has trouble with 2 step instructions.  In 2012, his wife started to notice mild cognitive issues.   She noted issues such as not knowing how to get from point A to point B. He had chemotherapy for MM 2015 to 2016 and he felt he did worse with memory and related issues.      Organization skills are worse and he is more forgetful.  He noted more difficulty retaining what he reads and decreased attention at times.  He is an Forensic psychologist.    I saw him July 2016 and felt he had mild cognitive impairment.    Since then he has seen Dr. Valentina Shaggy who confirmed MCI.     He takes 2000 U otc  Multiple Myeloma: He follows up regularly with hematology. He is stable.   He has IgA kappa multiple myeloma and amyloidosis in late 2014 (diagnosed with amyloidosis first, then after BM with MM). He has seen Dr.[place]nover [place] and has been referred to [facility] and [facility] for opinions, as well. He was treated with weekly Velcade and oral Cytoxan and Decadron.   He is currently not on medication  Mood:   He denies depression and notes less stress than less visit and no anxiety.Marland Kitchen  His wife feels he is more depressed.     Sleep:  Sleep is worse off Ambien.   With it, he gets a better night sleep but still wakes up 2 x nightly for nocturia.Marland Kitchen   He does not snore or have sleep dysregulated breathing.    He sleeps well.      Other:  He denies diabetes or thyroid disease.  He smoked about 2 ppd for 10 years many years ago.     The MRI report from 09/04/2014 showed nonspecific white matter findings, likely due to chronic small vessel ischemic changes. The actual images were not available for review.   Montreal Cognitive Assessment  11/04/2015 09/26/2014  Visuospatial/ Executive (0/5) 5 5  Naming (0/3) 3 2  Attention: Read list of digits (0/2) 2 1  Attention: Read list of letters (0/1) 1 1  Attention: Serial 7 subtraction starting at 100 (0/3) 3 3  Language: Repeat phrase (0/2) 1 2  Language : Fluency (0/1) 1 1  Abstraction (0/2) 2 2  Delayed Recall (0/5) 2 3  Orientation (0/6) 6 6  Total 26 26  Adjusted Score (based on education) 26 26      REVIEW OF SYSTEMS: Constitutional: No fevers, chills, sweats, or change in appetite Eyes: No visual changes, double vision, eye  pain Ear, nose and throat: Notes  hearing loss.  No ear pain, nasal congestion, sore throat Cardiovascular: No chest pain, palpitations Respiratory: No shortness of breath at rest or with exertion.   No wheezes GastrointestinaI: No nausea, vomiting, diarrhea, abdominal pain, fecal incontinence Genitourinary: Some urinary retention / BPH Musculoskeletal: No neck pain, back pain Integumentary: No rash, pruritus, skin lesions Neurological: as above Psychiatric: No depression at this time.  No anxiety Endocrine: No palpitations, diaphoresis, change in appetite, change in weigh or increased thirst Hematologic/Lymphatic: He has multiple myeloma with amyloidosis. Allergic/Immunologic: No itchy/runny eyes, nasal congestion, recent allergic reactions, rashes  ALLERGIES: Allergies  Allergen Reactions  . Penicillins Swelling and Rash    HOME MEDICATIONS:  Current Outpatient Prescriptions:  .  ANDROGEL PUMP 20.25 MG/ACT (1.62%) GEL, Apply 3 application topically every morning. , Disp: , Rfl:  .  Cholecalciferol (VITAMIN D3 PO), Take 1 capsule by mouth daily., Disp: , Rfl:  .  folic acid-pyridoxine-cyancobalamin (FOLTX) 2.5-25-2 MG TABS tablet, Take 1 tablet by mouth daily., Disp: 30 each, Rfl: 11 .  Omega-3 Fatty Acids (FISH OIL PO), Take 2 capsules by mouth daily. , Disp: , Rfl:  .  zolpidem (AMBIEN) 5 MG tablet, Take 1 tablet (5 mg total) by mouth at bedtime as needed for sleep., Disp: 30 tablet, Rfl: 0 .  busPIRone (BUSPAR) 10 MG tablet, Take 1 tablet (10 mg total) by mouth 2 (two) times daily., Disp: 60 tablet, Rfl: 3  PAST MEDICAL HISTORY: Past Medical History:  Diagnosis Date  . Cancer ([place]) 2016   amylodosis, multiple myeloma  . Hyperlipidemia   . Nephrolithiasis   . Prostatitis    acute and chronic    PAST SURGICAL HISTORY: Past Surgical History:  Procedure Laterality Date  . APPENDECTOMY    . CARDIAC CATHETERIZATION N/A 06/13/2015   Procedure: Left Heart Cath and  Coronary Angiography;  Surgeon: Jettie Booze, MD;  Location: [place] [facility];  Service: Cardiovascular;  Laterality: N/A;  . COLONOSCOPY  04/07/00  . ELBOW SURGERY  2003   right  . HERNIA REPAIR    . ROTATOR CUFF REPAIR      FAMILY HISTORY: Family History  Problem Relation Age of Onset  . Pancreatic cancer Father   . Parkinsonism Mother   . CAD Brother 75    SOCIAL HISTORY:  Social History   Social History  . Marital status: Married    Spouse name: N/A  . Number of children: N/A  . Years of education: N/A   Occupational History  . Not on file.   Social History  Main Topics  . Smoking status: Former Smoker    Quit date: 03/15/1977  . Smokeless tobacco: Never Used     Comment: quit 35 years ago  . Alcohol use 0.0 oz/week     Comment: approx 1 drink/night  . Drug use: No  . Sexual activity: Yes    Birth control/ protection: None   Other Topics Concern  . Not on file   Social History Narrative  . No narrative on file     PHYSICAL EXAM  Vitals:   01/03/17 1000  BP: 122/64  Pulse: 73  Resp: 18  Weight: 158 lb (71.7 kg)  Height: '5\' 8"'$  (1.727 m)    Body mass index is 24.02 kg/m.   General: The patient is well-developed and well-nourished and in no acute distress  Neurologic Exam  Mental status: The patient is alert and oriented x 3 (off by one day) at the time of the examination. Today, the patient has reduced recent (2/3 without prompt and 3/3 with prompt) and good remote memory, with a reduced attention span and concentration ability (100-93-86-79-71-64;WORLD-DLOW).   CLock ok.  Pattern continuation with one error.  Speech is normal.     Cranial nerves: Extraocular movements are full.      Facial strength is normal.  Trapezius and sternocleidomastoid strength is normal. No dysarthria is noted.  The tongue is midline, and the patient has symmetric elevation of the soft palate. No obvious hearing deficits are noted.  Motor:  Muscle bulk is normal.    Tone is normal. Strength is  5 / 5 in all 4 extremities.   Sensory: Sensory testing is Intact to touch, temperature and vibration..  Coordination: Cerebellar testing reveals good finger-nose-finger and heel-to-shin bilaterally.  Gait and station: Station is normal.  Gait and tandem gait are normal.  Reflexes: Deep tendon reflexes are symmetric and normal bilaterally.      DIAGNOSTIC DATA (LABS, IMAGING, TESTING) - I reviewed patient records, labs, notes, testing and imaging myself where available.  Lab Results  Component Value Date   WBC 5.3 06/10/2015   HGB 15.9 06/10/2015   HCT 45.9 06/10/2015   MCV 94.8 06/10/2015   PLT 255 06/10/2015      Component Value Date/Time   NA 142 06/10/2015 1330   NA 139 04/03/2015 1445   NA 141 07/29/2014 1404   NA 141 04/17/2013 1044   K 4.5 06/10/2015 1330   K 3.8 04/03/2015 1445   K 3.5 07/29/2014 1404   K 4.1 04/17/2013 1044   CL 105 06/10/2015 1330   CL 106 04/03/2015 1445   CL 102 07/29/2014 1404   CO2 28 06/10/2015 1330   CO2 23 04/03/2015 1445   CO2 30 07/29/2014 1404   CO2 28 04/17/2013 1044   GLUCOSE 91 06/10/2015 1330   GLUCOSE 129 (H) 07/29/2014 1404   BUN 17 06/10/2015 1330   BUN 18 04/03/2015 1445   BUN 16 07/29/2014 1404   BUN 17.3 04/17/2013 1044   CREATININE 0.99 06/10/2015 1330   CREATININE 1.1 04/17/2013 1044   CALCIUM 9.2 06/10/2015 1330   CALCIUM 9.4 04/03/2015 1445   CALCIUM 9.4 07/29/2014 1404   CALCIUM 9.8 04/17/2013 1044   PROT 7.1 04/03/2015 1445   PROT 7.4 07/29/2014 1404   PROT 7.6 04/17/2013 1044   ALBUMIN 4.2 04/03/2015 1445   ALBUMIN 4.1 04/17/2013 1044   AST 16 04/03/2015 1445   AST 20 07/29/2014 1404   AST 16 04/17/2013 1044   ALT 14 04/03/2015  1445   ALT 20 07/29/2014 1404   ALT 17 04/17/2013 1044   ALKPHOS 80 04/03/2015 1445   ALKPHOS 79 07/29/2014 1404   ALKPHOS 89 04/17/2013 1044   BILITOT 0.3 04/03/2015 1445   BILITOT 0.80 07/29/2014 1404   BILITOT 0.45 04/17/2013 1044    GFRNONAA 86 04/03/2015 1445   GFRAA 100 04/03/2015 1445   Lab Results  Component Value Date   CHOL 189 03/29/2011   HDL 45.30 03/29/2011   LDLCALC 126 (H) 03/29/2011   TRIG 87.0 03/29/2011   CHOLHDL 4 03/29/2011       ASSESSMENT AND PLAN  Memory disorder  Multiple myeloma in remission (HCC)  Insomnia, unspecified type  Depression with anxiety   1.   Continue  Foltx and Vit D and ASA.   2.    He has mild cognitive impairment and we discussed that he is at higher risk of developing Alzheimer's disease. 3.    Restart Buspar for anxiety.   Consider a beta blocker which could help tremor and physiologic anxiety issues RTC 12 months or sooner if new or worsening neurologic issues.  Richard A. Felecia Shelling, MD, PhD 36/62/9476, 5:46 PM Certified in Neurology, Clinical Neurophysiology, Sleep Medicine, Pain Medicine and Neuroimaging  [facility] [address], [place], [place] [address] [phone]

## 2017-03-04 DIAGNOSIS — R972 Elevated prostate specific antigen [PSA]: Secondary | ICD-10-CM | POA: Diagnosis not present

## 2017-03-04 DIAGNOSIS — E291 Testicular hypofunction: Secondary | ICD-10-CM | POA: Diagnosis not present

## 2017-03-14 DIAGNOSIS — R972 Elevated prostate specific antigen [PSA]: Secondary | ICD-10-CM | POA: Diagnosis not present

## 2017-03-14 DIAGNOSIS — E291 Testicular hypofunction: Secondary | ICD-10-CM | POA: Diagnosis not present

## 2017-03-14 DIAGNOSIS — N5201 Erectile dysfunction due to arterial insufficiency: Secondary | ICD-10-CM | POA: Diagnosis not present

## 2017-03-22 DIAGNOSIS — B353 Tinea pedis: Secondary | ICD-10-CM | POA: Diagnosis not present

## 2017-03-22 DIAGNOSIS — D1801 Hemangioma of skin and subcutaneous tissue: Secondary | ICD-10-CM | POA: Diagnosis not present

## 2017-03-22 DIAGNOSIS — L821 Other seborrheic keratosis: Secondary | ICD-10-CM | POA: Diagnosis not present

## 2017-03-22 DIAGNOSIS — L720 Epidermal cyst: Secondary | ICD-10-CM | POA: Diagnosis not present

## 2017-03-22 DIAGNOSIS — L812 Freckles: Secondary | ICD-10-CM | POA: Diagnosis not present

## 2017-03-22 DIAGNOSIS — B351 Tinea unguium: Secondary | ICD-10-CM | POA: Diagnosis not present

## 2017-03-22 DIAGNOSIS — Z85820 Personal history of malignant melanoma of skin: Secondary | ICD-10-CM | POA: Diagnosis not present

## 2017-05-17 DIAGNOSIS — M7670 Peroneal tendinitis, unspecified leg: Secondary | ICD-10-CM | POA: Insufficient documentation

## 2017-05-17 DIAGNOSIS — M79673 Pain in unspecified foot: Secondary | ICD-10-CM | POA: Insufficient documentation

## 2017-05-17 DIAGNOSIS — M25571 Pain in right ankle and joints of right foot: Secondary | ICD-10-CM | POA: Insufficient documentation

## 2017-05-17 DIAGNOSIS — M7671 Peroneal tendinitis, right leg: Secondary | ICD-10-CM | POA: Diagnosis not present

## 2017-05-26 ENCOUNTER — Other Ambulatory Visit (HOSPITAL_COMMUNITY): Payer: Self-pay

## 2017-05-26 DIAGNOSIS — R0989 Other specified symptoms and signs involving the circulatory and respiratory systems: Secondary | ICD-10-CM

## 2017-06-10 DIAGNOSIS — M25571 Pain in right ankle and joints of right foot: Secondary | ICD-10-CM | POA: Diagnosis not present

## 2017-06-10 DIAGNOSIS — M7671 Peroneal tendinitis, right leg: Secondary | ICD-10-CM | POA: Diagnosis not present

## 2017-06-14 DIAGNOSIS — C9000 Multiple myeloma not having achieved remission: Secondary | ICD-10-CM | POA: Diagnosis not present

## 2017-06-21 DIAGNOSIS — M76822 Posterior tibial tendinitis, left leg: Secondary | ICD-10-CM | POA: Diagnosis not present

## 2017-06-21 DIAGNOSIS — M722 Plantar fascial fibromatosis: Secondary | ICD-10-CM | POA: Diagnosis not present

## 2017-06-21 DIAGNOSIS — M25571 Pain in right ankle and joints of right foot: Secondary | ICD-10-CM | POA: Diagnosis not present

## 2017-06-21 DIAGNOSIS — M76821 Posterior tibial tendinitis, right leg: Secondary | ICD-10-CM | POA: Diagnosis not present

## 2017-06-21 DIAGNOSIS — M7671 Peroneal tendinitis, right leg: Secondary | ICD-10-CM | POA: Diagnosis not present

## 2017-06-23 DIAGNOSIS — C9000 Multiple myeloma not having achieved remission: Secondary | ICD-10-CM | POA: Diagnosis not present

## 2017-06-27 DIAGNOSIS — M7671 Peroneal tendinitis, right leg: Secondary | ICD-10-CM | POA: Diagnosis not present

## 2017-07-04 ENCOUNTER — Ambulatory Visit: Payer: Medicare Other | Admitting: Podiatry

## 2017-07-05 DIAGNOSIS — M7671 Peroneal tendinitis, right leg: Secondary | ICD-10-CM | POA: Diagnosis not present

## 2017-07-07 DIAGNOSIS — M7671 Peroneal tendinitis, right leg: Secondary | ICD-10-CM | POA: Diagnosis not present

## 2017-07-08 DIAGNOSIS — C9000 Multiple myeloma not having achieved remission: Secondary | ICD-10-CM | POA: Diagnosis not present

## 2017-07-11 DIAGNOSIS — M7671 Peroneal tendinitis, right leg: Secondary | ICD-10-CM | POA: Diagnosis not present

## 2017-07-12 DIAGNOSIS — R82998 Other abnormal findings in urine: Secondary | ICD-10-CM | POA: Diagnosis not present

## 2017-07-12 DIAGNOSIS — E291 Testicular hypofunction: Secondary | ICD-10-CM | POA: Diagnosis not present

## 2017-07-12 DIAGNOSIS — Z125 Encounter for screening for malignant neoplasm of prostate: Secondary | ICD-10-CM | POA: Diagnosis not present

## 2017-07-12 DIAGNOSIS — Z79899 Other long term (current) drug therapy: Secondary | ICD-10-CM | POA: Diagnosis not present

## 2017-07-14 DIAGNOSIS — M7671 Peroneal tendinitis, right leg: Secondary | ICD-10-CM | POA: Diagnosis not present

## 2017-07-15 DIAGNOSIS — Z1212 Encounter for screening for malignant neoplasm of rectum: Secondary | ICD-10-CM | POA: Diagnosis not present

## 2017-07-15 LAB — IFOBT (OCCULT BLOOD): IFOBT: POSITIVE

## 2017-07-20 DIAGNOSIS — M7671 Peroneal tendinitis, right leg: Secondary | ICD-10-CM | POA: Diagnosis not present

## 2017-07-21 DIAGNOSIS — C9000 Multiple myeloma not having achieved remission: Secondary | ICD-10-CM | POA: Diagnosis not present

## 2017-07-21 DIAGNOSIS — Z6826 Body mass index (BMI) 26.0-26.9, adult: Secondary | ICD-10-CM | POA: Diagnosis not present

## 2017-07-21 DIAGNOSIS — E291 Testicular hypofunction: Secondary | ICD-10-CM | POA: Diagnosis not present

## 2017-07-21 DIAGNOSIS — E7849 Other hyperlipidemia: Secondary | ICD-10-CM | POA: Diagnosis not present

## 2017-07-21 DIAGNOSIS — N401 Enlarged prostate with lower urinary tract symptoms: Secondary | ICD-10-CM | POA: Diagnosis not present

## 2017-07-21 DIAGNOSIS — Z1389 Encounter for screening for other disorder: Secondary | ICD-10-CM | POA: Diagnosis not present

## 2017-07-21 DIAGNOSIS — R413 Other amnesia: Secondary | ICD-10-CM | POA: Diagnosis not present

## 2017-07-21 DIAGNOSIS — Z0000 Encounter for general adult medical examination without abnormal findings: Secondary | ICD-10-CM | POA: Diagnosis not present

## 2017-07-21 DIAGNOSIS — R195 Other fecal abnormalities: Secondary | ICD-10-CM | POA: Diagnosis not present

## 2017-07-21 DIAGNOSIS — E859 Amyloidosis, unspecified: Secondary | ICD-10-CM | POA: Diagnosis not present

## 2017-07-21 DIAGNOSIS — I2510 Atherosclerotic heart disease of native coronary artery without angina pectoris: Secondary | ICD-10-CM | POA: Diagnosis not present

## 2017-07-25 ENCOUNTER — Encounter: Payer: Self-pay | Admitting: Internal Medicine

## 2017-07-26 ENCOUNTER — Encounter: Payer: Self-pay | Admitting: Internal Medicine

## 2017-07-26 DIAGNOSIS — Z6826 Body mass index (BMI) 26.0-26.9, adult: Secondary | ICD-10-CM | POA: Diagnosis not present

## 2017-07-26 DIAGNOSIS — K625 Hemorrhage of anus and rectum: Secondary | ICD-10-CM | POA: Diagnosis not present

## 2017-07-26 DIAGNOSIS — K649 Unspecified hemorrhoids: Secondary | ICD-10-CM | POA: Diagnosis not present

## 2017-07-27 DIAGNOSIS — K645 Perianal venous thrombosis: Secondary | ICD-10-CM | POA: Diagnosis not present

## 2017-07-28 ENCOUNTER — Ambulatory Visit: Payer: Self-pay | Admitting: Cardiovascular Disease

## 2017-07-28 NOTE — Progress Notes (Signed)
Cardiology Office Note   Date:  07/29/2017   ID:  NAOMI FITTON, DOB 1945-12-24, MRN [id number]  PCP:  [name], MD  Cardiologist:   Minus Breeding, MD   Chief Complaint  Patient presents with  . Coronary Calcium      History of Present Illness: William Barron is a 72 y.o. male who presents for evaluation of coronary calcium.  He had an abnormal POET (Plain Old Exercise Treadmill) and significant calcium in his coronaries.  However, cath demonstrated mild CAD. He had a follow up echo.   He had some moderate PR but this was unchanged from previous.  He has no pulmonary HTN and normal RV/LV function.   He returns for follow up.  He has had no new symptoms.  However, his cholesterol was up recently and his primary physician brought him back to discuss this.  He has not been exercising routinely routinely.  With his activity he denies cardiovascular symptoms.  The patient denies any new symptoms such as chest discomfort, neck or arm discomfort. There has been no new shortness of breath, PND or orthopnea. There have been no reported palpitations, presyncope or syncope.   Past Medical History:  Diagnosis Date  . Cancer ([place]) 2016   amylodosis, multiple myeloma  . Hyperlipidemia   . Nephrolithiasis   . Prostatitis    acute and chronic    Past Surgical History:  Procedure Laterality Date  . APPENDECTOMY    . CARDIAC CATHETERIZATION N/A 06/13/2015   Procedure: Left Heart Cath and Coronary Angiography;  Surgeon: Jettie Booze, MD;  Location: [place] [facility];  Service: Cardiovascular;  Laterality: N/A;  . COLONOSCOPY  04/07/00  . ELBOW SURGERY  2003   right  . HERNIA REPAIR    . ROTATOR CUFF REPAIR       Current Outpatient Medications  Medication Sig Dispense Refill  . ANDROGEL PUMP 20.25 MG/ACT (1.62%) GEL Apply 3 application topically every morning.     . zolpidem (AMBIEN) 5 MG tablet Take 1 tablet (5 mg total) by mouth at bedtime as needed for sleep.  30 tablet 0   No current facility-administered medications for this visit.     Allergies:   Penicillins    ROS:  Please see the history of present illness.   Otherwise, review of systems are positive for ED .   All other systems are reviewed and negative.    PHYSICAL EXAM: VS:  BP 134/70   Pulse 66   Ht '5\' 6"'  (1.676 m)   Wt 157 lb (71.2 kg)   BMI 25.34 kg/m  , BMI Body mass index is 25.34 kg/m.  GENERAL:  Well appearing NECK:  No jugular venous distention, waveform within normal limits, carotid upstroke brisk and symmetric, no bruits, no thyromegaly LUNGS:  Clear to auscultation bilaterally CHEST:  Unremarkable HEART:  PMI not displaced or sustained,S1 and S2 within normal limits, no S3, no S4, no clicks, no rubs, no murmurs ABD:  Flat, positive bowel sounds normal in frequency in pitch, no bruits, no rebound, no guarding, no midline pulsatile mass, no hepatomegaly, no splenomegaly EXT:  2 plus pulses throughout, no edema, no cyanosis no clubbing   EKG:  EKG is  ordered today. Sinus rhythm, rate 66, axis within normal limits, intervals within normal limits, no acute ST-T wave changes.  Poor anterior R wave progression.   Recent Labs: No results found for requested labs within last 8760 hours.  Wt Readings from Last 3 Encounters:  07/29/17 157 lb (71.2 kg)  01/03/17 158 lb (71.7 kg)  05/26/16 160 lb (72.6 kg)      Other studies Reviewed: Additional studies/ records that were reviewed today include: Labs, office records Review of the above records demonstrates:      ASSESSMENT AND PLAN:  ELEVATED CORONARY CALCIUM:    He has no new symptoms.  He had a nonobstructive disease on cath a couple of years ago.  At this point no further cardiovascular testing is suggested.  He should continue with risk reduction and this is discussed below.  PULMONIC REGURGITATION:    I will follow up with an echocardiogram.   REDUCED EF:  This will be evaluated as  above.  DYSLIPIDEMIA:  His LDL was 122 and HDL 36.  This technically puts his MESA score at 75 which is however, not absolutely applicable with his age.  I do think it is convincing data to have him start an aspirin.   We talked about a statin but he would rather hold off on this.  He will try diet and exercise and then repeat a lipid profile.   Current medicines are reviewed at length with the patient today.  The patient does not have concerns regarding medicines.  The following changes have been made:  None  Labs/ tests ordered today include:   None  Orders Placed This Encounter  Procedures  . EKG 12-Lead  . ECHOCARDIOGRAM COMPLETE     Disposition:   FU with me in one year.     Signed, Minus Breeding, MD  07/29/2017 1:22 PM    [facility]

## 2017-07-29 ENCOUNTER — Ambulatory Visit (INDEPENDENT_AMBULATORY_CARE_PROVIDER_SITE_OTHER): Payer: Medicare Other | Admitting: Cardiology

## 2017-07-29 ENCOUNTER — Encounter: Payer: Self-pay | Admitting: Cardiology

## 2017-07-29 VITALS — BP 134/70 | HR 66 | Ht 66.0 in | Wt 157.0 lb

## 2017-07-29 DIAGNOSIS — I272 Pulmonary hypertension, unspecified: Secondary | ICD-10-CM

## 2017-07-29 DIAGNOSIS — R931 Abnormal findings on diagnostic imaging of heart and coronary circulation: Secondary | ICD-10-CM

## 2017-07-29 NOTE — Patient Instructions (Signed)

## 2017-08-03 ENCOUNTER — Other Ambulatory Visit: Payer: Self-pay

## 2017-08-03 ENCOUNTER — Ambulatory Visit (HOSPITAL_COMMUNITY): Payer: Medicare Other | Attending: Cardiology

## 2017-08-03 DIAGNOSIS — Z8249 Family history of ischemic heart disease and other diseases of the circulatory system: Secondary | ICD-10-CM | POA: Insufficient documentation

## 2017-08-03 DIAGNOSIS — C9000 Multiple myeloma not having achieved remission: Secondary | ICD-10-CM | POA: Insufficient documentation

## 2017-08-03 DIAGNOSIS — M7671 Peroneal tendinitis, right leg: Secondary | ICD-10-CM | POA: Diagnosis not present

## 2017-08-03 DIAGNOSIS — I77810 Thoracic aortic ectasia: Secondary | ICD-10-CM | POA: Diagnosis not present

## 2017-08-03 DIAGNOSIS — E785 Hyperlipidemia, unspecified: Secondary | ICD-10-CM | POA: Diagnosis not present

## 2017-08-03 DIAGNOSIS — E859 Amyloidosis, unspecified: Secondary | ICD-10-CM | POA: Diagnosis not present

## 2017-08-03 DIAGNOSIS — Z87891 Personal history of nicotine dependence: Secondary | ICD-10-CM | POA: Insufficient documentation

## 2017-08-03 DIAGNOSIS — I272 Pulmonary hypertension, unspecified: Secondary | ICD-10-CM

## 2017-08-11 ENCOUNTER — Ambulatory Visit (INDEPENDENT_AMBULATORY_CARE_PROVIDER_SITE_OTHER): Payer: Medicare Other | Admitting: Orthopedic Surgery

## 2017-08-11 ENCOUNTER — Ambulatory Visit (INDEPENDENT_AMBULATORY_CARE_PROVIDER_SITE_OTHER): Payer: Medicare Other

## 2017-08-11 ENCOUNTER — Encounter (INDEPENDENT_AMBULATORY_CARE_PROVIDER_SITE_OTHER): Payer: Self-pay | Admitting: Orthopedic Surgery

## 2017-08-11 DIAGNOSIS — M6701 Short Achilles tendon (acquired), right ankle: Secondary | ICD-10-CM | POA: Diagnosis not present

## 2017-08-11 DIAGNOSIS — M25571 Pain in right ankle and joints of right foot: Secondary | ICD-10-CM

## 2017-08-11 DIAGNOSIS — R931 Abnormal findings on diagnostic imaging of heart and coronary circulation: Secondary | ICD-10-CM | POA: Diagnosis not present

## 2017-08-11 DIAGNOSIS — M7671 Peroneal tendinitis, right leg: Secondary | ICD-10-CM

## 2017-08-11 NOTE — Progress Notes (Signed)
Office Visit Note   Patient: William Barron           Date of Birth: 1945-10-05           MRN: [id number] Visit Date: 08/11/2017              Requested by: [name], MD [address] [place], [place] [address] PCP: [name], MD  Chief Complaint  Patient presents with  . Right Ankle - Pain      HPI: Patient is a 72 year old gentleman who presents with over 101-monthhistory of pain along the peroneal tendons right foot.  He states it is worse with activities he has used anti-inflammatories without relief.  He has some custom orthotics which he states seems to make it feel better.  [facility]  Review of systems positive for rotator cuff surgery.  Patient has a history of tobacco use.  Assessment & Plan: Visit Diagnoses:  1. Pain in right ankle and joints of right foot   2. Achilles tendon contracture, right   3. Peroneal tendinitis, right leg     Plan: Patient was given instructions for a [place]oka sneaker to unload the midfoot.  He was given instructions and demonstrated Achilles stretching to do 5 times a day a minute at a time he can try over-the-counter sole orthotics to provide arch support.  And recommended CBD lotion to help with the peroneal tendinitis.  If patient is not showing improvement in several months he will call we will set him up for an MRI scan to evaluate the peroneal tendons for any tearing.  Follow-Up Instructions: Return if symptoms worsen or fail to improve.   Ortho Exam  Patient is alert, oriented, no adenopathy, well-dressed, normal affect, normal respiratory effort. Examination patient has a strong posterior tibial pulse.  He has a normal gait with pace planus.  He has no tenderness to palpation of the sinus Tarsi the ankle joint is nontender to palpation he is point tender to palpation of the peroneal tendons and he does have swelling of the peroneal tendons.  There is no subluxation of the  tendons.  He has significant heel cord contracture with dorsiflexion about 10 degrees short of neutral with his knee extended.  Imaging: Xr Ankle Complete Right  Result Date: 08/11/2017 3 view radiographs of the right ankle show some calcification of the posterior tibial and anterior tibial arteries.  Patient has a congruent ankle joint with no osteochondral defects no joint space narrowing.  No images are attached to the encounter.  Labs: Lab Results  Component Value Date   REPTSTATUS 05/04/2015 FINAL 05/01/2015   CULT  05/01/2015    >=100,000 COLONIES/mL [facility] COLI [facility]     Lab Results  Component Value Date   ALBUMIN 4.2 04/03/2015   ALBUMIN 3.8 07/29/2014   ALBUMIN 3.4 04/30/2014    There is no height or weight on file to calculate BMI.  Orders:  Orders Placed This Encounter  Procedures  . XR Ankle Complete Right   No orders of the defined types were placed in this encounter.    Procedures: No procedures performed  Clinical Data: No additional findings.  ROS:  All other systems negative, except as noted in the HPI. Review of Systems  Objective: Vital Signs: There were no vitals taken for this visit.  Specialty Comments:  No specialty comments available.  PMFS History: Patient  Active Problem List   Diagnosis Date Noted  . Depression with anxiety 01/03/2017  . Insomnia 05/05/2016  . Coronary artery calcification seen on CAT scan   . DOE (dyspnea on exertion)   . Fatigue 02/27/2015  . Elevated homocysteine ([place]) 09/26/2014  . Multiple myeloma ([place]) 09/26/2014  . Encounter for antineoplastic chemotherapy 01/02/2014  . Kahler disease ([place]) 01/02/2014  . Avitaminosis D 01/02/2014  . Amyloidosis ([place]) 03/26/2013  . Enlarged lymph node 02/14/2013  . Pure hypercholesterolemia 03/29/2011  . Memory disorder 09/21/2010  . Labile hypertension 09/21/2010   Past Medical History:    Diagnosis Date  . Cancer ([place]) 2016   amylodosis, multiple myeloma  . Hyperlipidemia   . Nephrolithiasis   . Prostatitis    acute and chronic    Family History  Problem Relation Age of Onset  . Pancreatic cancer Father   . Parkinsonism Mother   . CAD Brother 76    Past Surgical History:  Procedure Laterality Date  . APPENDECTOMY    . CARDIAC CATHETERIZATION N/A 06/13/2015   Procedure: Left Heart Cath and Coronary Angiography;  Surgeon: Jettie Booze, MD;  Location: [place] [facility];  Service: Cardiovascular;  Laterality: N/A;  . COLONOSCOPY  04/07/00  . ELBOW SURGERY  2003   right  . HERNIA REPAIR    . ROTATOR CUFF REPAIR     Social History   Occupational History  . Not on file  Tobacco Use  . Smoking status: Former Smoker    Last attempt to quit: 03/15/1977    Years since quitting: 40.4  . Smokeless tobacco: Never Used  . Tobacco comment: quit 35 years ago  Substance and Sexual Activity  . Alcohol use: Yes    Alcohol/week: 0.0 oz    Comment: approx 1 drink/night  . Drug use: No  . Sexual activity: Yes    Birth control/protection: None

## 2017-08-12 DIAGNOSIS — M7671 Peroneal tendinitis, right leg: Secondary | ICD-10-CM | POA: Diagnosis not present

## 2017-08-16 DIAGNOSIS — L718 Other rosacea: Secondary | ICD-10-CM | POA: Diagnosis not present

## 2017-08-31 DIAGNOSIS — E291 Testicular hypofunction: Secondary | ICD-10-CM | POA: Diagnosis not present

## 2017-09-07 DIAGNOSIS — N5201 Erectile dysfunction due to arterial insufficiency: Secondary | ICD-10-CM | POA: Diagnosis not present

## 2017-09-07 DIAGNOSIS — N401 Enlarged prostate with lower urinary tract symptoms: Secondary | ICD-10-CM | POA: Diagnosis not present

## 2017-09-07 DIAGNOSIS — Z87442 Personal history of urinary calculi: Secondary | ICD-10-CM | POA: Diagnosis not present

## 2017-09-07 DIAGNOSIS — E291 Testicular hypofunction: Secondary | ICD-10-CM | POA: Diagnosis not present

## 2017-09-07 DIAGNOSIS — R351 Nocturia: Secondary | ICD-10-CM | POA: Diagnosis not present

## 2017-09-21 ENCOUNTER — Encounter (INDEPENDENT_AMBULATORY_CARE_PROVIDER_SITE_OTHER): Payer: Self-pay

## 2017-09-21 ENCOUNTER — Encounter: Payer: Self-pay | Admitting: Internal Medicine

## 2017-09-21 ENCOUNTER — Ambulatory Visit: Payer: Self-pay | Admitting: Internal Medicine

## 2017-09-21 ENCOUNTER — Ambulatory Visit (INDEPENDENT_AMBULATORY_CARE_PROVIDER_SITE_OTHER): Payer: Medicare Other | Admitting: Internal Medicine

## 2017-09-21 VITALS — BP 130/78 | HR 70 | Ht 66.0 in | Wt 156.1 lb

## 2017-09-21 DIAGNOSIS — R931 Abnormal findings on diagnostic imaging of heart and coronary circulation: Secondary | ICD-10-CM

## 2017-09-21 DIAGNOSIS — R195 Other fecal abnormalities: Secondary | ICD-10-CM | POA: Diagnosis not present

## 2017-09-21 NOTE — Patient Instructions (Addendum)
You have been scheduled for a colonoscopy. Please follow written instructions given to you at your visit today.  Please pick up your prep supplies at the pharmacy. If you use inhalers (even only as needed), please bring them with you on the day of your procedure.   I appreciate the opportunity to care for you. Carl Gessner, MD, FACG 

## 2017-09-21 NOTE — Progress Notes (Signed)
   William Barron 72 y.o. 29-Nov-1945 [id number]  Assessment & Plan:   Encounter Diagnosis  Name Primary?  . Heme positive stool iFOBT Yes    Plan for colonoscopy to investigate. The risks and benefits as well as alternatives of endoscopic procedure(s) have been discussed and reviewed. All questions answered. The patient agrees to proceed.   I appreciate the opportunity to care for this patient. [id number], William Reaper, MD  Subjective:   Chief Complaint: heme + stool  HPI 73 yo wm pt of [name] - had iFOBT + screening stool test recently. Not anemic. No bleeding or active GI sxs. Recent PCP notes reviewed. Has hx of smoldering myeloma and ? Of amyloidosis - Tx at [facility], evaluated for and determined not in need of a stem cell transplant. This has been stable and followed by [name]. Prior colonoscopy about 9 yrs ago [facility] - negative per patient  Allergies  Allergen Reactions  . Penicillins Swelling and Rash   Current Meds  Medication Sig  . ANDROGEL PUMP 20.25 MG/ACT (1.62%) GEL Apply 3 application topically every morning.   . zolpidem (AMBIEN) 5 MG tablet Take 1 tablet (5 mg total) by mouth at bedtime as needed for sleep.   Past Medical History:  Diagnosis Date  . Amyloidosis ([place])   . Hyperlipidemia   . Multiple myeloma ([place])   . Nephrolithiasis   . Prostatitis    acute and chronic  . Smoldering multiple myeloma ([name]) 2016   Past Surgical History:  Procedure Laterality Date  . APPENDECTOMY    . CARDIAC CATHETERIZATION N/A 06/13/2015   Procedure: Left Heart Cath and Coronary Angiography;  Surgeon: Jettie Booze, MD;  Location: [place] [facility];  Service: Cardiovascular;  Laterality: N/A;  . COLONOSCOPY  04/07/00  . ELBOW SURGERY  2003   right  . HERNIA REPAIR    . 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   Review of Systems + insomnia No sig DOE/chest pain  Objective:   Physical Exam '@BP'$  130/78   Pulse 70   Ht '5\' 6"'$  (1.676 m)   Wt 156 lb 1 oz (70.8 kg)   BMI 25.19 kg/m @  General:  Well-developed, well-nourished and in no acute distress Eyes:  anicteric. ENT:   Mouth and posterior pharynx free of lesions.  Neck:   supple w/o thyromegaly or mass.  Lungs: Clear to auscultation bilaterally. Heart:  S1S2, no rubs, murmurs, gallops. Abdomen:  soft, non-tender, no hepatosplenomegaly, hernia, or mass and BS+.  Rectal: deferred Lymph:  no cervical or supraclavicular adenopathy. Extremities:   no edema, cyanosis or clubbing Skin   no rash. Neuro:  A&O x 3.  Psych:  appropriate mood and  Affect.   Data Reviewed: See HPI

## 2017-09-27 ENCOUNTER — Encounter: Payer: Self-pay | Admitting: Internal Medicine

## 2017-10-04 DIAGNOSIS — L738 Other specified follicular disorders: Secondary | ICD-10-CM | POA: Diagnosis not present

## 2017-10-04 DIAGNOSIS — D1801 Hemangioma of skin and subcutaneous tissue: Secondary | ICD-10-CM | POA: Diagnosis not present

## 2017-10-04 DIAGNOSIS — L821 Other seborrheic keratosis: Secondary | ICD-10-CM | POA: Diagnosis not present

## 2017-10-04 DIAGNOSIS — Z85820 Personal history of malignant melanoma of skin: Secondary | ICD-10-CM | POA: Diagnosis not present

## 2017-10-04 DIAGNOSIS — B351 Tinea unguium: Secondary | ICD-10-CM | POA: Diagnosis not present

## 2017-10-04 DIAGNOSIS — D225 Melanocytic nevi of trunk: Secondary | ICD-10-CM | POA: Diagnosis not present

## 2017-11-07 ENCOUNTER — Ambulatory Visit: Payer: Self-pay | Admitting: Gastroenterology

## 2017-11-08 ENCOUNTER — Encounter: Payer: Self-pay | Admitting: Internal Medicine

## 2017-11-08 ENCOUNTER — Ambulatory Visit (AMBULATORY_SURGERY_CENTER): Payer: Medicare Other | Admitting: Internal Medicine

## 2017-11-08 VITALS — BP 115/63 | HR 60 | Temp 97.8°F | Resp 15 | Ht 66.0 in | Wt 156.0 lb

## 2017-11-08 DIAGNOSIS — D124 Benign neoplasm of descending colon: Secondary | ICD-10-CM | POA: Diagnosis not present

## 2017-11-08 DIAGNOSIS — K639 Disease of intestine, unspecified: Secondary | ICD-10-CM

## 2017-11-08 DIAGNOSIS — R195 Other fecal abnormalities: Secondary | ICD-10-CM | POA: Diagnosis not present

## 2017-11-08 DIAGNOSIS — K6389 Other specified diseases of intestine: Secondary | ICD-10-CM

## 2017-11-08 DIAGNOSIS — D125 Benign neoplasm of sigmoid colon: Secondary | ICD-10-CM

## 2017-11-08 DIAGNOSIS — K633 Ulcer of intestine: Secondary | ICD-10-CM | POA: Diagnosis not present

## 2017-11-08 DIAGNOSIS — D123 Benign neoplasm of transverse colon: Secondary | ICD-10-CM | POA: Diagnosis not present

## 2017-11-08 MED ORDER — SODIUM CHLORIDE 0.9 % IV SOLN: 500.0000 mL | Freq: Once | INTRAVENOUS | Status: DC

## 2017-11-08 NOTE — Patient Instructions (Addendum)
I found some inflammation in the end of the small intestine and where the small intestine joins the colon. I took biopsies. Also saw a few areas of inflammation in the colon (biopsied), a tiny polyp (removed) and some hemorrhoids.  Any or all of these could have caused the blood to be in the stool.  I will call you with results and recommendations.  I appreciate the opportunity to care for you. Gatha Mayer, MD, FACG  YOU HAD AN ENDOSCOPIC PROCEDURE TODAY AT [facility]:   Refer to the procedure report that was given to you for any specific questions about what was found during the examination.  If the procedure report does not answer your questions, please call your gastroenterologist to clarify.  If you requested that your care partner not be given the details of your procedure findings, then the procedure report has been included in a sealed envelope for you to review at your convenience later.  YOU SHOULD EXPECT: Some feelings of bloating in the abdomen. Passage of more gas than usual.  Walking can help get rid of the air that was put into your GI tract during the procedure and reduce the bloating. If you had a lower endoscopy (such as a colonoscopy or flexible sigmoidoscopy) you may notice spotting of blood in your stool or on the toilet paper. If you underwent a bowel prep for your procedure, you may not have a normal bowel movement for a few days.  Please Note:  You might notice some irritation and congestion in your nose or some drainage.  This is from the oxygen used during your procedure.  There is no need for concern and it should clear up in a day or so.  SYMPTOMS TO REPORT IMMEDIATELY:   Following lower endoscopy (colonoscopy or flexible sigmoidoscopy):  Excessive amounts of blood in the stool  Significant tenderness or worsening of abdominal pains  Swelling of the abdomen that is new, acute  Fever of 100F or higher   For urgent or emergent issues,  a gastroenterologist can be reached at any hour by calling [phone].   DIET:  We do recommend a small meal at first, but then you may proceed to your regular diet.  Drink plenty of fluids but you should avoid alcoholic beverages for 24 hours.  ACTIVITY:  You should plan to take it easy for the rest of today and you should NOT DRIVE or use heavy machinery until tomorrow (because of the sedation medicines used during the test).    FOLLOW UP: Our staff will call the number listed on your records the next business day following your procedure to check on you and address any questions or concerns that you may have regarding the information given to you following your procedure. If we do not reach you, we will leave a message.  However, if you are feeling well and you are not experiencing any problems, there is no need to return our call.  We will assume that you have returned to your regular daily activities without incident.  If any biopsies were taken you will be contacted by phone or by letter within the next 1-3 weeks.  Please call us at [phone] if you have not heard about the biopsies in 3 weeks.    SIGNATURES/CONFIDENTIALITY: You and/or your care partner have signed paperwork which will be entered into your electronic medical record.  These signatures attest to the fact that that the information above on your  After Visit Summary has been reviewed and is understood.  Full responsibility of the confidentiality of this discharge information lies with you and/or your care-partner.  Polyp, diverticulosis, and hemorrhoid information.

## 2017-11-08 NOTE — Op Note (Signed)
[place] Patient Name: William Barron Procedure Date: 11/08/2017 4:07 PM MRN: [id number] Endoscopist: Gatha Mayer , MD Age: 72 Referring MD:  Date of Birth: [birth year] Gender: Male Account #: [id number] Procedure:                Colonoscopy Indications:              Heme positive stool Medicines:                Propofol per Anesthesia, Monitored Anesthesia Care Procedure:                Pre-Anesthesia Assessment:                           - Prior to the procedure, a History and Physical                            was performed, and patient medications and                            allergies were reviewed. The patient's tolerance of                            previous anesthesia was also reviewed. The risks                            and benefits of the procedure and the sedation                            options and risks were discussed with the patient.                            All questions were answered, and informed consent                            was obtained. Prior Anticoagulants: The patient has                            taken no previous anticoagulant or antiplatelet                            agents. ASA Grade Assessment: II - A patient with                            mild systemic disease. After reviewing the risks                            and benefits, the patient was deemed in                            satisfactory condition to undergo the procedure.                           After obtaining informed consent, the colonoscope  was passed under direct vision. Throughout the                            procedure, the patient's blood pressure, pulse, and                            oxygen saturations were monitored continuously. The                            Colonoscope was introduced through the anus and                            advanced to the the terminal ileum, with                            identification of the  appendiceal orifice and IC                            valve. The colonoscopy was performed without                            difficulty. The patient tolerated the procedure                            well. The quality of the bowel preparation was                            excellent. The bowel preparation used was Miralax.                            The terminal ileum, ileocecal valve, appendiceal                            orifice, and rectum were photographed. Scope In: 4:21:04 PM Scope Out: 4:41:47 PM Scope Withdrawal Time: 0 hours 17 minutes 42 seconds  Total Procedure Duration: 0 hours 20 minutes 43 seconds  Findings:                 The perianal and digital rectal examinations were                            normal. Pertinent negatives include normal prostate                            (size, shape, and consistency).                           A patchy area of mucosa in the terminal ileum was                            ulcerated. Biopsies were taken with a cold forceps                            for histology. Verification of patient  identification for the specimen was done. Estimated                            blood loss was minimal.                           Ulcerated mucosa with stigmata of recent bleeding                            were present at the ileocecal valve. Biopsies were                            taken with a cold forceps for histology.                            Verification of patient identification for the                            specimen was done. Estimated blood loss was minimal.                           Discontinuous areas of ulcerated mucosa with no                            stigmata of recent bleeding were present in the                            transverse colon. Biopsies were taken with a cold                            forceps for histology. Verification of patient                            identification for the specimen  was done. Estimated                            blood loss was minimal.                           A diminutive polyp was found in the descending                            colon. The polyp was sessile. The polyp was removed                            with a cold biopsy forceps. Resection and retrieval                            were complete. Verification of patient                            identification for the specimen was done. Estimated  blood loss was minimal.                           Multiple diverticula were found in the sigmoid                            colon.                           Internal hemorrhoids were found.                           Anal papilla(e) were hypertrophied.                           The exam was otherwise without abnormality on                            direct and retroflexion views. Complications:            No immediate complications. Estimated Blood Loss:     Estimated blood loss was minimal. Impression:               - Ulcerated mucosa in the terminal ileum. Biopsied.                           - Mucosal ulceration. Biopsied.                           - Mucosal ulceration. Biopsied.                           - One diminutive polyp in the descending colon,                            removed with a cold biopsy forceps. Resected and                            retrieved.                           - Diverticulosis in the sigmoid colon.                           - Internal hemorrhoids.                           - Anal papilla(e) were hypertrophied.                           - The examination was otherwise normal on direct                            and retroflexion views. Recommendation:           - Patient has a contact number available for                            emergencies. The  signs and symptoms of potential                            delayed complications were discussed with the                            patient. Return  to normal activities tomorrow.                            Written discharge instructions were provided to the                            patient.                           - Resume previous diet.                           - Continue present medications.                           - Await pathology results.                           - Repeat colonoscopy is recommended. The                            colonoscopy date will be determined after pathology                            results from today's exam become available for                            review. Gatha Mayer, MD 11/08/2017 4:58:38 PM This report has been signed electronically.

## 2017-11-08 NOTE — Progress Notes (Signed)
Pt's states no medical or surgical changes since previsit or office visit. 

## 2017-11-08 NOTE — Progress Notes (Signed)
Report to PACU, RN, vss, BBS= Clear.  

## 2017-11-08 NOTE — Progress Notes (Signed)
Called to room to assist during endoscopic procedure.  Patient ID and intended procedure confirmed with present staff. Received instructions for my participation in the procedure from the performing physician.  

## 2017-11-09 ENCOUNTER — Telehealth: Payer: Self-pay

## 2017-11-09 NOTE — Telephone Encounter (Signed)
  Follow up Call-  Call back number 11/08/2017  Post procedure Call Back phone  # [phone]  Permission to leave phone message Yes  Some recent data might be hidden     Patient questions:  Do you have a fever, pain , or abdominal swelling? No. Pain Score  0 *  Have you tolerated food without any problems? Yes.    Have you been able to return to your normal activities? Yes.    Do you have any questions about your discharge instructions: Diet   No. Medications  No. Follow up visit  No.  Do you have questions or concerns about your Care? No.  Actions: * If pain score is 4 or above: No action needed, pain <4.

## 2017-11-17 ENCOUNTER — Telehealth: Payer: Self-pay | Admitting: Internal Medicine

## 2017-11-17 NOTE — Telephone Encounter (Signed)
I left a message for the patient that results are not available yet.  He is notified that he will be mailed a letter when Dr. Carlean Purl has reviewed

## 2017-11-24 NOTE — Progress Notes (Signed)
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 at [facility] by fax and he should see her also, possibly sooner than planned though he can call her and ask that ? Based upon this info  I do not think he is having bad diarrhea but if he is let me know.

## 2017-11-30 DIAGNOSIS — H2513 Age-related nuclear cataract, bilateral: Secondary | ICD-10-CM | POA: Diagnosis not present

## 2018-01-03 ENCOUNTER — Encounter: Payer: Self-pay | Admitting: Neurology

## 2018-01-03 ENCOUNTER — Ambulatory Visit (INDEPENDENT_AMBULATORY_CARE_PROVIDER_SITE_OTHER): Payer: Medicare Other | Admitting: Neurology

## 2018-01-03 ENCOUNTER — Telehealth: Payer: Self-pay | Admitting: Neurology

## 2018-01-03 ENCOUNTER — Other Ambulatory Visit: Payer: Self-pay

## 2018-01-03 VITALS — BP 132/77 | HR 84 | Ht 66.0 in | Wt 155.5 lb

## 2018-01-03 DIAGNOSIS — G250 Essential tremor: Secondary | ICD-10-CM | POA: Diagnosis not present

## 2018-01-03 DIAGNOSIS — C9001 Multiple myeloma in remission: Secondary | ICD-10-CM

## 2018-01-03 DIAGNOSIS — R413 Other amnesia: Secondary | ICD-10-CM | POA: Diagnosis not present

## 2018-01-03 DIAGNOSIS — R931 Abnormal findings on diagnostic imaging of heart and coronary circulation: Secondary | ICD-10-CM | POA: Diagnosis not present

## 2018-01-03 DIAGNOSIS — H532 Diplopia: Secondary | ICD-10-CM | POA: Diagnosis not present

## 2018-01-03 NOTE — Progress Notes (Signed)
[facility]  PATIENT: William Barron DOB: [birth year]  REFERRING DOCTOR OR PCP:  [name] SOURCE: patient, records in EMR and from [place], MRI report and lab results  _________________________________   HISTORICAL  CHIEF COMPLAINT:  Chief Complaint  Patient presents with  . Follow-up    RM 12 with wife, [name]. Last seen 01/03/17. Here to f/u on memory issues. Takes buspar for anxiety.   . Memory Loss    Today's Mier 29/30. AFT: 15  . Tremors    Having worsening tremors in arms/hands. He is leaning over while walking now per wife. Denies any falls in the last year.   . Eye Problem    Having double vision in right eye. He does not drive. Wife does the driving.     HISTORY OF PRESENT ILLNESS:  William Barron is a 72 y.o. man with mild cognitive impairment.   Update 01/03/2018: Since the last visit, he has noted more difficulties with tremors in the arms/hands and he is walking slower and more stooped.    His tremor is worse with intention and also when he stands up.    The right leg will sometimes shake when he stnds up straight.     He has not had difficulty with falls/stumbles.    He uses the rail to use stairs but has done so for at least a few years.      He  plays golf but notes putting is worse.     His mother had Prakinson's disease  He feels memory is doing better.   He is recalling better and scored 29/30 on the MoCA today.   He notes diplopa upon right gaze.  This bothers him more at night.     He will be getting prism glasses.      Montreal Cognitive Assessment  01/03/2018 01/03/2018 11/04/2015 09/26/2014  Visuospatial/ Executive (0/5) _0 Naming (0/3) _1 Attention: Read list of digits (0/2) _2 Attention: Read list of letters (0/1) _3 Attention: Serial 7 subtraction starting at 100 (0/3) _4 Language: Repeat phrase (0/2) _5 Language : Fluency (0/1) _6 Abstraction (0/2) _7 Delayed Recall  (0/5) _8 Orientation (0/6) _9 Total _10 Adjusted Score (based on education) - _11 Update 01/03/2017:   He feels he has been stable since the last visit. 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  He notes some memory lapses but no worse than the last visit.  He is not having any difficulty driving and is not forgetting to do things around the house. He feels his life is less stressful and that has helped the anxiety. He is no longer on BuSpar.. He does have a trial where he is a witness coming up soon.    He is exercising 4-5 days a week .    Mild cognitive impairment was shown on the formal neurocognitive testing. Imaging studies showed chronic microvascular ischemic change  His multiple myeloma appears in remission (followed at [facility]).   He has no new health issues.     ___________________________________________ From 05/05/2016:   He feels he is doing a little worse.   He retired from Sports coach.   We discussed trying to find a hobby or activity  to keep him more active.  They recently bought a beach house and remodeling is keeping him active.    Memory:   He notes doing mildly worse.    He plays cars and makes more errors.    He has trouble with 2 step instructions.  In 2012, his wife started to notice mild cognitive issues.   She noted issues such as not knowing how to get from point A to point B. He had chemotherapy for MM 2015 to 2016 and he felt he did worse with memory and related issues.     Organization skills are worse and he is more forgetful.  He noted more difficulty retaining what he reads and decreased attention at times.  He is an Forensic psychologist.    I saw him July 2016 and felt he had mild cognitive impairment.    Since then he has seen Dr. Valentina Shaggy who confirmed MCI.     He takes 2000 U otc  Multiple Myeloma: He follows up regularly with hematology. He is stable.   He has IgA kappa multiple myeloma and amyloidosis in late 2014 (diagnosed with amyloidosis  first, then after BM with MM). He has seen Dr.Ennover [place] in [place] and has been referred to [facility] and [facility] for opinions, as well. He was treated with weekly Velcade and oral Cytoxan and Decadron.   He is currently not on medication  Mood:   He denies depression and notes less stress than less visit and no anxiety.Marland Kitchen  His wife feels he is more depressed.     Sleep:  Sleep is worse off Ambien.   With it, he gets a better night sleep but still wakes up 2 x nightly for nocturia.Marland Kitchen   He does not snore or have sleep dysregulated breathing.    He sleeps well.      Other:  He denies diabetes or thyroid disease.     He smoked about 2 ppd for 10 years many years ago.     The MRI report from 09/04/2014 showed nonspecific white matter findings, likely due to chronic small vessel ischemic changes. The actual images were not available for review.   Montreal Cognitive Assessment  01/03/2018 01/03/2018 11/04/2015 09/26/2014  Visuospatial/ Executive (0/5) _0 Naming (0/3) _1 Attention: Read list of digits (0/2) _2 Attention: Read list of letters (0/1) _3 Attention: Serial 7 subtraction starting at 100 (0/3) _4 Language: Repeat phrase (0/2) _5 Language : Fluency (0/1) _6 Abstraction (0/2) _7 Delayed Recall (0/5) _8 Orientation (0/6) _9 Total _10 Adjusted Score (based on education) - _11 REVIEW OF SYSTEMS: Constitutional: No fevers, chills, sweats, or change in appetite Eyes: No visual changes, double vision, eye pain Ear, nose and throat: Notes  hearing loss.  No ear pain, nasal congestion, sore throat Cardiovascular: No chest pain, palpitations Respiratory: No shortness of breath at rest or with exertion.   No wheezes GastrointestinaI: No nausea, vomiting, diarrhea, abdominal pain, fecal incontinence Genitourinary: Some urinary retention / BPH Musculoskeletal: No neck pain, back pain Integumentary: No rash,  pruritus, skin lesions Neurological: as above Psychiatric: No depression at this time.  No anxiety Endocrine: No palpitations, diaphoresis, change in appetite, change in weigh or increased thirst  Hematologic/Lymphatic: He has multiple myeloma with amyloidosis. Allergic/Immunologic: No itchy/runny eyes, nasal congestion, recent allergic reactions, rashes  ALLERGIES: Allergies  Allergen Reactions  . Penicillins Swelling and Rash    HOME MEDICATIONS:  Current Outpatient Medications:  .  ANDROGEL PUMP 20.25 MG/ACT (1.62%) GEL, Apply 3 application topically every morning. , Disp: , Rfl:  .  zolpidem (AMBIEN) 5 MG tablet, Take 1 tablet (5 mg total) by mouth at bedtime as needed for sleep., Disp: 30 tablet, Rfl: 0  PAST MEDICAL HISTORY: Past Medical History:  Diagnosis Date  . Amyloidosis ([place])   . Hearing loss    Has hearing aids  . Hyperlipidemia   . Multiple myeloma ([place])   . Nephrolithiasis   . Prostatitis    acute and chronic  . 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 APPENDECTOMY    . CARDIAC CATHETERIZATION N/A 06/13/2015   Procedure: Left Heart Cath and Coronary Angiography;  Surgeon: Jettie Booze, MD;  Location: [place] [facility];  Service: Cardiovascular;  Laterality: N/A;  . COLONOSCOPY  04/07/00  . ELBOW SURGERY  2003   right  . HERNIA REPAIR    . ROTATOR CUFF REPAIR      FAMILY HISTORY: Family History  Problem Relation Age of Onset  . Pancreatic cancer Father   . Parkinsonism Mother   . CAD Brother 31    SOCIAL HISTORY:  Social History   Socioeconomic History  . Marital status: Married    Spouse name: Not on file  . Number of children: 2  . Years of education: Not on file  . Highest education level: Not on file  Occupational History  . Occupation: retired  Scientific laboratory technician  . Financial resource strain: Not on file  . Food insecurity:    Worry: Not on file     Inability: Not on file  . Transportation needs:    Medical: Not on file    Non-medical: Not on file  Tobacco Use  . Smoking status: Former Smoker    Last attempt to quit: 03/15/1977    Years since quitting: 40.8  . Smokeless tobacco: Never Used  . Tobacco comment: quit 35 years ago  Substance and Sexual Activity  . Alcohol use: Yes    Alcohol/week: 0.0 standard drinks    Comment: approx 1 drink/night  . Drug use: No  . Sexual activity: Yes    Birth control/protection: None  Lifestyle  . Physical activity:    Days per week: Not on file    Minutes per session: Not on file  . Stress: Not on file  Relationships  . Social connections:    Talks on phone: Not on file    Gets together: Not on file    Attends religious service: Not on file    Active member of club or organization: Not on file    Attends meetings of clubs or organizations: Not on file    Relationship status: Not on file  . Intimate partner violence:    Fear of current or ex partner: Not on file    Emotionally abused: Not on file    Physically abused: Not on file    Forced sexual activity: Not on file  Other Topics Concern  . Not on file  Social History Narrative   Married, 2 children   Retired criminal Counsellor and [employer]   1 drink/day     PHYSICAL  EXAM  Vitals:   01/03/18 1049  BP: 132/77  Pulse: 84  Weight: 155 lb 8 oz (70.5 kg)  Height: 5' 6" (1.676 m)    Body mass index is 25.1 kg/m.   General: The patient is well-developed and well-nourished and in no acute distress  Neurologic Exam  Mental status: The patient is alert and oriented x 3 (off by one day) at the time of the examination.  He scored 29/30 on the [facility] cognitive assessment losing 1 point for repetition and language.  He scored 5/5 in delayed recall and had good visual spatial/executive function pattern continuation with one error.  Speech is normal.     Cranial nerves: Extraocular movements are  full.      Facial strength is normal.  Trapezius and sternocleidomastoid strength is normal. No dysarthria is noted.  The tongue is midline, and the patient has symmetric elevation of the soft palate. No obvious hearing deficits are noted.  Motor: He has a 6 to 7 Hz low amplitude tremor in the left hand more than the right hand.  The tremor was most noticeable drawing spirals.  Muscle bulk is normal.   Tone is normal. Strength is  5 / 5 in all 4 extremities.   Sensory: Sensory testing is Intact to touch, temperature and vibration..  Coordination: Cerebellar testing reveals good finger-nose-finger and heel-to-shin bilaterally.  Gait and station: Station is normal.  Gait and tandem gait are normal.  Reflexes: Deep tendon reflexes are symmetric and normal bilaterally.      DIAGNOSTIC DATA (LABS, IMAGING, TESTING) - I reviewed patient records, labs, notes, testing and imaging myself where available.  Lab Results  Component Value Date   WBC 5.3 06/10/2015   HGB 15.9 06/10/2015   HCT 45.9 06/10/2015   MCV 94.8 06/10/2015   PLT 255 06/10/2015      Component Value Date/Time   NA 142 06/10/2015 1330   NA 139 04/03/2015 1445   NA 141 07/29/2014 1404   NA 141 04/17/2013 1044   K 4.5 06/10/2015 1330   K 3.8 04/03/2015 1445   K 3.5 07/29/2014 1404   K 4.1 04/17/2013 1044   CL 105 06/10/2015 1330   CL 106 04/03/2015 1445   CL 102 07/29/2014 1404   CO2 28 06/10/2015 1330   CO2 23 04/03/2015 1445   CO2 30 07/29/2014 1404   CO2 28 04/17/2013 1044   GLUCOSE 91 06/10/2015 1330   GLUCOSE 129 (H) 07/29/2014 1404   BUN 17 06/10/2015 1330   BUN 18 04/03/2015 1445   BUN 16 07/29/2014 1404   BUN 17.3 04/17/2013 1044   CREATININE 0.99 06/10/2015 1330   CREATININE 1.1 04/17/2013 1044   CALCIUM 9.2 06/10/2015 1330   CALCIUM 9.4 04/03/2015 1445   CALCIUM 9.4 07/29/2014 1404   CALCIUM 9.8 04/17/2013 1044   PROT 7.1 04/03/2015 1445   PROT 7.4 07/29/2014 1404   PROT 7.6 04/17/2013 1044    ALBUMIN 4.2 04/03/2015 1445   ALBUMIN 4.1 04/17/2013 1044   AST 16 04/03/2015 1445   AST 20 07/29/2014 1404   AST 16 04/17/2013 1044   ALT 14 04/03/2015 1445   ALT 20 07/29/2014 1404   ALT 17 04/17/2013 1044   ALKPHOS 80 04/03/2015 1445   ALKPHOS 79 07/29/2014 1404   ALKPHOS 89 04/17/2013 1044   BILITOT 0.3 04/03/2015 1445   BILITOT 0.80 07/29/2014 1404   BILITOT 0.45 04/17/2013 1044   GFRNONAA 86 04/03/2015 1445   GFRAA 100 04/03/2015 1445  Lab Results  Component Value Date   CHOL 189 03/29/2011   HDL 45.30 03/29/2011   LDLCALC 126 (H) 03/29/2011   TRIG 87.0 03/29/2011   CHOLHDL 4 03/29/2011       ASSESSMENT AND PLAN  Multiple myeloma in remission ([place])  Diplopia - Plan: MR BRAIN WO CONTRAST  Memory loss - Plan: MR BRAIN WO CONTRAST  Memory disorder  Benign essential tremor   1.   MRI brain to determine possible etiology of diplopia 2.    He has mild cognitive impairment and we discussed that he is at higher risk of developing Alzheimer's disease.  3.     Consider a beta blocker which could help tremor  4.    Continue  Foltx and Vit D and ASA.   RTC 12 months or sooner if new or worsening neurologic issues.   A. Felecia Shelling, MD, PhD 02/58/5277, 8:24 PM Certified in Neurology, Clinical Neurophysiology, Sleep Medicine, Pain Medicine and Neuroimaging  [facility] [address], [place], [place] [address] [phone]

## 2018-01-03 NOTE — Telephone Encounter (Signed)
Medicare/bcbs supp order sent to GI. No auth they will reach out to the pt to schedule.  °

## 2018-01-03 NOTE — Telephone Encounter (Signed)
lvm for pt to be aware of this also left their number of [phone] and to call if he has not heard in the next 2-3 business days.

## 2018-01-04 DIAGNOSIS — C9000 Multiple myeloma not having achieved remission: Secondary | ICD-10-CM | POA: Diagnosis not present

## 2018-01-04 DIAGNOSIS — E859 Amyloidosis, unspecified: Secondary | ICD-10-CM | POA: Diagnosis not present

## 2018-01-11 DIAGNOSIS — E8581 Light chain (AL) amyloidosis: Secondary | ICD-10-CM | POA: Diagnosis not present

## 2018-01-11 DIAGNOSIS — C9000 Multiple myeloma not having achieved remission: Secondary | ICD-10-CM | POA: Diagnosis not present

## 2018-01-11 DIAGNOSIS — D4989 Neoplasm of unspecified behavior of other specified sites: Secondary | ICD-10-CM | POA: Diagnosis not present

## 2018-01-16 ENCOUNTER — Ambulatory Visit
Admission: RE | Admit: 2018-01-16 | Discharge: 2018-01-16 | Disposition: A | Discharge status: Home / Self Care | Payer: Medicare Other | Source: Ambulatory Visit | Attending: Neurology | Admitting: Neurology

## 2018-01-16 DIAGNOSIS — H532 Diplopia: Secondary | ICD-10-CM | POA: Diagnosis not present

## 2018-01-16 DIAGNOSIS — R413 Other amnesia: Secondary | ICD-10-CM

## 2018-01-17 ENCOUNTER — Telehealth: Payer: Self-pay | Admitting: *Deleted

## 2018-01-17 NOTE — Telephone Encounter (Signed)
-----   Message from Britt Bottom, MD sent at 01/16/2018  4:59 PM EST ----- Please let the patient know that the new MRI of the brain was compared to the one from 2016.  He has age-related changes that have slightly progressed in the last 3 years but nothing unexpected

## 2018-01-17 NOTE — Telephone Encounter (Signed)
Called, LVM (ok per DPR) about MRI results per Dr. Felecia Shelling note. Gave [facility] phone number if he has further questions/concerns.

## 2018-01-18 DIAGNOSIS — M50322 Other cervical disc degeneration at C5-C6 level: Secondary | ICD-10-CM | POA: Diagnosis not present

## 2018-01-18 DIAGNOSIS — E559 Vitamin D deficiency, unspecified: Secondary | ICD-10-CM | POA: Diagnosis not present

## 2018-01-18 DIAGNOSIS — G250 Essential tremor: Secondary | ICD-10-CM | POA: Diagnosis not present

## 2018-01-18 DIAGNOSIS — H532 Diplopia: Secondary | ICD-10-CM | POA: Diagnosis not present

## 2018-01-18 DIAGNOSIS — Z7982 Long term (current) use of aspirin: Secondary | ICD-10-CM | POA: Diagnosis not present

## 2018-01-18 DIAGNOSIS — Z87891 Personal history of nicotine dependence: Secondary | ICD-10-CM | POA: Diagnosis not present

## 2018-01-18 DIAGNOSIS — E8581 Light chain (AL) amyloidosis: Secondary | ICD-10-CM | POA: Diagnosis not present

## 2018-01-18 DIAGNOSIS — Z79899 Other long term (current) drug therapy: Secondary | ICD-10-CM | POA: Diagnosis not present

## 2018-01-18 DIAGNOSIS — M8580 Other specified disorders of bone density and structure, unspecified site: Secondary | ICD-10-CM | POA: Diagnosis not present

## 2018-01-18 DIAGNOSIS — I2510 Atherosclerotic heart disease of native coronary artery without angina pectoris: Secondary | ICD-10-CM | POA: Diagnosis not present

## 2018-01-18 DIAGNOSIS — C9000 Multiple myeloma not having achieved remission: Secondary | ICD-10-CM | POA: Diagnosis not present

## 2018-01-18 DIAGNOSIS — Z5112 Encounter for antineoplastic immunotherapy: Secondary | ICD-10-CM | POA: Diagnosis not present

## 2018-01-19 DIAGNOSIS — E8581 Light chain (AL) amyloidosis: Secondary | ICD-10-CM | POA: Diagnosis not present

## 2018-01-19 DIAGNOSIS — C9000 Multiple myeloma not having achieved remission: Secondary | ICD-10-CM | POA: Diagnosis not present

## 2018-01-19 DIAGNOSIS — Z5112 Encounter for antineoplastic immunotherapy: Secondary | ICD-10-CM | POA: Diagnosis not present

## 2018-01-25 DIAGNOSIS — E8581 Light chain (AL) amyloidosis: Secondary | ICD-10-CM | POA: Diagnosis not present

## 2018-01-25 DIAGNOSIS — C9000 Multiple myeloma not having achieved remission: Secondary | ICD-10-CM | POA: Diagnosis not present

## 2018-01-25 DIAGNOSIS — Z5112 Encounter for antineoplastic immunotherapy: Secondary | ICD-10-CM | POA: Diagnosis not present

## 2018-01-27 DIAGNOSIS — Z23 Encounter for immunization: Secondary | ICD-10-CM | POA: Diagnosis not present

## 2018-01-31 DIAGNOSIS — E859 Amyloidosis, unspecified: Secondary | ICD-10-CM | POA: Diagnosis not present

## 2018-02-01 DIAGNOSIS — E8581 Light chain (AL) amyloidosis: Secondary | ICD-10-CM | POA: Diagnosis not present

## 2018-02-01 DIAGNOSIS — C9000 Multiple myeloma not having achieved remission: Secondary | ICD-10-CM | POA: Diagnosis not present

## 2018-02-01 DIAGNOSIS — Z5112 Encounter for antineoplastic immunotherapy: Secondary | ICD-10-CM | POA: Diagnosis not present

## 2018-02-08 DIAGNOSIS — E8581 Light chain (AL) amyloidosis: Secondary | ICD-10-CM | POA: Diagnosis not present

## 2018-02-08 DIAGNOSIS — C9000 Multiple myeloma not having achieved remission: Secondary | ICD-10-CM | POA: Diagnosis not present

## 2018-02-15 DIAGNOSIS — I2510 Atherosclerotic heart disease of native coronary artery without angina pectoris: Secondary | ICD-10-CM | POA: Diagnosis not present

## 2018-02-15 DIAGNOSIS — C9000 Multiple myeloma not having achieved remission: Secondary | ICD-10-CM | POA: Diagnosis not present

## 2018-02-15 DIAGNOSIS — Z79899 Other long term (current) drug therapy: Secondary | ICD-10-CM | POA: Diagnosis not present

## 2018-02-15 DIAGNOSIS — G250 Essential tremor: Secondary | ICD-10-CM | POA: Diagnosis not present

## 2018-02-15 DIAGNOSIS — R451 Restlessness and agitation: Secondary | ICD-10-CM | POA: Diagnosis not present

## 2018-02-15 DIAGNOSIS — H532 Diplopia: Secondary | ICD-10-CM | POA: Diagnosis not present

## 2018-02-15 DIAGNOSIS — R768 Other specified abnormal immunological findings in serum: Secondary | ICD-10-CM | POA: Diagnosis not present

## 2018-02-15 DIAGNOSIS — Z8379 Family history of other diseases of the digestive system: Secondary | ICD-10-CM | POA: Diagnosis not present

## 2018-02-15 DIAGNOSIS — Z5112 Encounter for antineoplastic immunotherapy: Secondary | ICD-10-CM | POA: Diagnosis not present

## 2018-02-15 DIAGNOSIS — R5383 Other fatigue: Secondary | ICD-10-CM | POA: Diagnosis not present

## 2018-02-15 DIAGNOSIS — R5381 Other malaise: Secondary | ICD-10-CM | POA: Diagnosis not present

## 2018-02-15 DIAGNOSIS — E559 Vitamin D deficiency, unspecified: Secondary | ICD-10-CM | POA: Diagnosis not present

## 2018-02-15 DIAGNOSIS — R590 Localized enlarged lymph nodes: Secondary | ICD-10-CM | POA: Diagnosis not present

## 2018-02-15 DIAGNOSIS — G4700 Insomnia, unspecified: Secondary | ICD-10-CM | POA: Diagnosis not present

## 2018-02-15 DIAGNOSIS — Z87891 Personal history of nicotine dependence: Secondary | ICD-10-CM | POA: Diagnosis not present

## 2018-02-15 DIAGNOSIS — Z7982 Long term (current) use of aspirin: Secondary | ICD-10-CM | POA: Diagnosis not present

## 2018-02-15 DIAGNOSIS — E8581 Light chain (AL) amyloidosis: Secondary | ICD-10-CM | POA: Diagnosis not present

## 2018-02-22 DIAGNOSIS — E8581 Light chain (AL) amyloidosis: Secondary | ICD-10-CM | POA: Diagnosis not present

## 2018-02-22 DIAGNOSIS — C9000 Multiple myeloma not having achieved remission: Secondary | ICD-10-CM | POA: Diagnosis not present

## 2018-02-22 DIAGNOSIS — Z5112 Encounter for antineoplastic immunotherapy: Secondary | ICD-10-CM | POA: Diagnosis not present

## 2018-02-23 DIAGNOSIS — E291 Testicular hypofunction: Secondary | ICD-10-CM | POA: Diagnosis not present

## 2018-02-23 DIAGNOSIS — N401 Enlarged prostate with lower urinary tract symptoms: Secondary | ICD-10-CM | POA: Diagnosis not present

## 2018-02-27 DIAGNOSIS — E291 Testicular hypofunction: Secondary | ICD-10-CM | POA: Diagnosis not present

## 2018-02-27 DIAGNOSIS — R351 Nocturia: Secondary | ICD-10-CM | POA: Diagnosis not present

## 2018-02-27 DIAGNOSIS — N5201 Erectile dysfunction due to arterial insufficiency: Secondary | ICD-10-CM | POA: Diagnosis not present

## 2018-02-27 DIAGNOSIS — R972 Elevated prostate specific antigen [PSA]: Secondary | ICD-10-CM | POA: Diagnosis not present

## 2018-02-27 DIAGNOSIS — N401 Enlarged prostate with lower urinary tract symptoms: Secondary | ICD-10-CM | POA: Diagnosis not present

## 2018-02-28 DIAGNOSIS — E8581 Light chain (AL) amyloidosis: Secondary | ICD-10-CM | POA: Diagnosis not present

## 2018-02-28 DIAGNOSIS — C9000 Multiple myeloma not having achieved remission: Secondary | ICD-10-CM | POA: Diagnosis not present

## 2018-02-28 DIAGNOSIS — Z5112 Encounter for antineoplastic immunotherapy: Secondary | ICD-10-CM | POA: Diagnosis not present

## 2018-03-03 DIAGNOSIS — Z6826 Body mass index (BMI) 26.0-26.9, adult: Secondary | ICD-10-CM | POA: Diagnosis not present

## 2018-03-03 DIAGNOSIS — H6123 Impacted cerumen, bilateral: Secondary | ICD-10-CM | POA: Diagnosis not present

## 2018-03-07 DIAGNOSIS — Z5112 Encounter for antineoplastic immunotherapy: Secondary | ICD-10-CM | POA: Diagnosis not present

## 2018-03-07 DIAGNOSIS — E8581 Light chain (AL) amyloidosis: Secondary | ICD-10-CM | POA: Diagnosis not present

## 2018-03-07 DIAGNOSIS — C9000 Multiple myeloma not having achieved remission: Secondary | ICD-10-CM | POA: Diagnosis not present

## 2018-03-17 DIAGNOSIS — H532 Diplopia: Secondary | ICD-10-CM | POA: Diagnosis not present

## 2018-03-17 DIAGNOSIS — Z87891 Personal history of nicotine dependence: Secondary | ICD-10-CM | POA: Diagnosis not present

## 2018-03-17 DIAGNOSIS — I2510 Atherosclerotic heart disease of native coronary artery without angina pectoris: Secondary | ICD-10-CM | POA: Diagnosis not present

## 2018-03-17 DIAGNOSIS — J3489 Other specified disorders of nose and nasal sinuses: Secondary | ICD-10-CM | POA: Diagnosis not present

## 2018-03-17 DIAGNOSIS — C9000 Multiple myeloma not having achieved remission: Secondary | ICD-10-CM | POA: Diagnosis not present

## 2018-03-17 DIAGNOSIS — Z800 Family history of malignant neoplasm of digestive organs: Secondary | ICD-10-CM | POA: Diagnosis not present

## 2018-03-17 DIAGNOSIS — R5383 Other fatigue: Secondary | ICD-10-CM | POA: Diagnosis not present

## 2018-03-17 DIAGNOSIS — E8581 Light chain (AL) amyloidosis: Secondary | ICD-10-CM | POA: Diagnosis not present

## 2018-03-17 DIAGNOSIS — Z79899 Other long term (current) drug therapy: Secondary | ICD-10-CM | POA: Diagnosis not present

## 2018-03-17 DIAGNOSIS — Z5112 Encounter for antineoplastic immunotherapy: Secondary | ICD-10-CM | POA: Diagnosis not present

## 2018-03-17 DIAGNOSIS — G250 Essential tremor: Secondary | ICD-10-CM | POA: Diagnosis not present

## 2018-03-17 DIAGNOSIS — Z7982 Long term (current) use of aspirin: Secondary | ICD-10-CM | POA: Diagnosis not present

## 2018-03-17 DIAGNOSIS — E559 Vitamin D deficiency, unspecified: Secondary | ICD-10-CM | POA: Diagnosis not present

## 2018-03-29 DIAGNOSIS — C9000 Multiple myeloma not having achieved remission: Secondary | ICD-10-CM | POA: Diagnosis not present

## 2018-03-29 DIAGNOSIS — E8581 Light chain (AL) amyloidosis: Secondary | ICD-10-CM | POA: Diagnosis not present

## 2018-03-29 DIAGNOSIS — Z5112 Encounter for antineoplastic immunotherapy: Secondary | ICD-10-CM | POA: Diagnosis not present

## 2018-04-05 DIAGNOSIS — R599 Enlarged lymph nodes, unspecified: Secondary | ICD-10-CM | POA: Diagnosis not present

## 2018-04-12 DIAGNOSIS — R5383 Other fatigue: Secondary | ICD-10-CM | POA: Diagnosis not present

## 2018-04-12 DIAGNOSIS — Z7982 Long term (current) use of aspirin: Secondary | ICD-10-CM | POA: Diagnosis not present

## 2018-04-12 DIAGNOSIS — G250 Essential tremor: Secondary | ICD-10-CM | POA: Diagnosis not present

## 2018-04-12 DIAGNOSIS — Z87891 Personal history of nicotine dependence: Secondary | ICD-10-CM | POA: Diagnosis not present

## 2018-04-12 DIAGNOSIS — Z79899 Other long term (current) drug therapy: Secondary | ICD-10-CM | POA: Diagnosis not present

## 2018-04-12 DIAGNOSIS — E8581 Light chain (AL) amyloidosis: Secondary | ICD-10-CM | POA: Diagnosis not present

## 2018-04-12 DIAGNOSIS — C9000 Multiple myeloma not having achieved remission: Secondary | ICD-10-CM | POA: Diagnosis not present

## 2018-04-12 DIAGNOSIS — R590 Localized enlarged lymph nodes: Secondary | ICD-10-CM | POA: Diagnosis not present

## 2018-04-12 DIAGNOSIS — H532 Diplopia: Secondary | ICD-10-CM | POA: Diagnosis not present

## 2018-04-12 DIAGNOSIS — Z5112 Encounter for antineoplastic immunotherapy: Secondary | ICD-10-CM | POA: Diagnosis not present

## 2018-04-26 DIAGNOSIS — Z5112 Encounter for antineoplastic immunotherapy: Secondary | ICD-10-CM | POA: Diagnosis not present

## 2018-04-26 DIAGNOSIS — C9000 Multiple myeloma not having achieved remission: Secondary | ICD-10-CM | POA: Diagnosis not present

## 2018-04-26 DIAGNOSIS — E8581 Light chain (AL) amyloidosis: Secondary | ICD-10-CM | POA: Diagnosis not present

## 2018-05-10 DIAGNOSIS — E559 Vitamin D deficiency, unspecified: Secondary | ICD-10-CM | POA: Diagnosis not present

## 2018-05-10 DIAGNOSIS — C9000 Multiple myeloma not having achieved remission: Secondary | ICD-10-CM | POA: Diagnosis not present

## 2018-05-10 DIAGNOSIS — Z5112 Encounter for antineoplastic immunotherapy: Secondary | ICD-10-CM | POA: Diagnosis not present

## 2018-05-10 DIAGNOSIS — E8581 Light chain (AL) amyloidosis: Secondary | ICD-10-CM | POA: Diagnosis not present

## 2018-05-10 DIAGNOSIS — G250 Essential tremor: Secondary | ICD-10-CM | POA: Diagnosis not present

## 2018-05-10 DIAGNOSIS — I2510 Atherosclerotic heart disease of native coronary artery without angina pectoris: Secondary | ICD-10-CM | POA: Diagnosis not present

## 2018-05-10 DIAGNOSIS — Z87891 Personal history of nicotine dependence: Secondary | ICD-10-CM | POA: Diagnosis not present

## 2018-05-23 DIAGNOSIS — L111 Transient acantholytic dermatosis [Grover]: Secondary | ICD-10-CM | POA: Diagnosis not present

## 2018-05-23 DIAGNOSIS — Z85820 Personal history of malignant melanoma of skin: Secondary | ICD-10-CM | POA: Diagnosis not present

## 2018-05-23 DIAGNOSIS — L812 Freckles: Secondary | ICD-10-CM | POA: Diagnosis not present

## 2018-05-23 DIAGNOSIS — D1801 Hemangioma of skin and subcutaneous tissue: Secondary | ICD-10-CM | POA: Diagnosis not present

## 2018-05-23 DIAGNOSIS — B351 Tinea unguium: Secondary | ICD-10-CM | POA: Diagnosis not present

## 2018-05-23 DIAGNOSIS — D225 Melanocytic nevi of trunk: Secondary | ICD-10-CM | POA: Diagnosis not present

## 2018-05-23 DIAGNOSIS — L821 Other seborrheic keratosis: Secondary | ICD-10-CM | POA: Diagnosis not present

## 2018-05-24 DIAGNOSIS — Z5112 Encounter for antineoplastic immunotherapy: Secondary | ICD-10-CM | POA: Diagnosis not present

## 2018-05-24 DIAGNOSIS — C9000 Multiple myeloma not having achieved remission: Secondary | ICD-10-CM | POA: Diagnosis not present

## 2018-05-24 DIAGNOSIS — E8581 Light chain (AL) amyloidosis: Secondary | ICD-10-CM | POA: Diagnosis not present

## 2018-06-07 ENCOUNTER — Telehealth: Payer: Self-pay | Admitting: Hematology & Oncology

## 2018-06-07 DIAGNOSIS — Z5112 Encounter for antineoplastic immunotherapy: Secondary | ICD-10-CM | POA: Diagnosis not present

## 2018-06-07 DIAGNOSIS — H532 Diplopia: Secondary | ICD-10-CM | POA: Diagnosis not present

## 2018-06-07 DIAGNOSIS — E559 Vitamin D deficiency, unspecified: Secondary | ICD-10-CM | POA: Diagnosis not present

## 2018-06-07 DIAGNOSIS — Z87891 Personal history of nicotine dependence: Secondary | ICD-10-CM | POA: Diagnosis not present

## 2018-06-07 DIAGNOSIS — I2510 Atherosclerotic heart disease of native coronary artery without angina pectoris: Secondary | ICD-10-CM | POA: Diagnosis not present

## 2018-06-07 DIAGNOSIS — R5383 Other fatigue: Secondary | ICD-10-CM | POA: Diagnosis not present

## 2018-06-07 DIAGNOSIS — R4189 Other symptoms and signs involving cognitive functions and awareness: Secondary | ICD-10-CM | POA: Diagnosis not present

## 2018-06-07 DIAGNOSIS — E8581 Light chain (AL) amyloidosis: Secondary | ICD-10-CM | POA: Diagnosis not present

## 2018-06-07 DIAGNOSIS — G250 Essential tremor: Secondary | ICD-10-CM | POA: Diagnosis not present

## 2018-06-07 DIAGNOSIS — C9000 Multiple myeloma not having achieved remission: Secondary | ICD-10-CM | POA: Diagnosis not present

## 2018-06-07 NOTE — Telephone Encounter (Signed)
lmom to inform patient of  Lab/infusion appt 06/21/18 at 0800 per Stacy. Pt does not want to travel to [facility] for treatments due to corona virus pandemic

## 2018-06-08 ENCOUNTER — Other Ambulatory Visit: Payer: Self-pay

## 2018-06-12 ENCOUNTER — Other Ambulatory Visit: Payer: Self-pay | Admitting: Hematology & Oncology

## 2018-06-12 ENCOUNTER — Telehealth: Payer: Self-pay | Admitting: *Deleted

## 2018-06-12 DIAGNOSIS — C9001 Multiple myeloma in remission: Secondary | ICD-10-CM

## 2018-06-12 NOTE — Progress Notes (Signed)
START ON PATHWAY REGIMEN - Multiple Myeloma and Other Plasma Cell Dyscrasias     Cycles 1 and 2: A cycle is every 28 days:     Daratumumab    Cycles 3 through 6: A cycle is every 28 days:     Daratumumab    Cycles 7 and beyond: A cycle is every 28 days:     Daratumumab   **Always confirm dose/schedule in your pharmacy ordering system**  Patient Characteristics: Relapsed / Refractory, Second through Fourth Lines of Therapy R-ISS Staging: Not Applicable Disease Classification: Relapsed Line of Therapy: Second Line Intent of Therapy: Non-Curative / Palliative Intent, Discussed with Patient

## 2018-06-12 NOTE — Telephone Encounter (Signed)
This nurse called and spoke to patient regarding upcoming appointments at [facility]. He needs a NEW patient appointment to see Dr. Marin Olp. He verbalized understanding. Per Dr. Marin Olp, he can see him tomorrow, 06/13/18, at 12:30 pm. Patient verbalized understanding of date, time and directions [facility] the [facility].

## 2018-06-13 ENCOUNTER — Inpatient Hospital Stay: Payer: Medicare Other

## 2018-06-13 ENCOUNTER — Inpatient Hospital Stay: Payer: Medicare Other | Attending: Hematology & Oncology | Admitting: Hematology & Oncology

## 2018-06-13 ENCOUNTER — Other Ambulatory Visit: Payer: Self-pay

## 2018-06-13 ENCOUNTER — Encounter: Payer: Self-pay | Admitting: Hematology & Oncology

## 2018-06-13 VITALS — BP 132/70 | HR 72 | Temp 97.9°F | Resp 16 | Ht 66.0 in | Wt 150.0 lb

## 2018-06-13 DIAGNOSIS — Z87891 Personal history of nicotine dependence: Secondary | ICD-10-CM | POA: Insufficient documentation

## 2018-06-13 DIAGNOSIS — Z7982 Long term (current) use of aspirin: Secondary | ICD-10-CM | POA: Insufficient documentation

## 2018-06-13 DIAGNOSIS — Z800 Family history of malignant neoplasm of digestive organs: Secondary | ICD-10-CM

## 2018-06-13 DIAGNOSIS — C9001 Multiple myeloma in remission: Secondary | ICD-10-CM

## 2018-06-13 DIAGNOSIS — C9000 Multiple myeloma not having achieved remission: Secondary | ICD-10-CM | POA: Diagnosis not present

## 2018-06-13 DIAGNOSIS — E8581 Light chain (AL) amyloidosis: Secondary | ICD-10-CM | POA: Diagnosis not present

## 2018-06-13 LAB — CBC WITH DIFFERENTIAL (CANCER CENTER ONLY)
Abs Immature Granulocytes: 0.02 10*3/uL (ref 0.00–0.07)
Basophils Absolute: 0.1 10*3/uL (ref 0.0–0.1)
Basophils Relative: 1 %
Eosinophils Absolute: 0.3 10*3/uL (ref 0.0–0.5)
Eosinophils Relative: 4 %
HCT: 49.1 % (ref 39.0–52.0)
Hemoglobin: 15.9 g/dL (ref 13.0–17.0)
Immature Granulocytes: 0 %
Lymphocytes Relative: 22 %
Lymphs Abs: 1.8 10*3/uL (ref 0.7–4.0)
MCH: 30.5 pg (ref 26.0–34.0)
MCHC: 32.4 g/dL (ref 30.0–36.0)
MCV: 94.1 fL (ref 80.0–100.0)
Monocytes Absolute: 0.7 10*3/uL (ref 0.1–1.0)
Monocytes Relative: 9 %
Neutro Abs: 5.2 10*3/uL (ref 1.7–7.7)
Neutrophils Relative %: 64 %
Platelet Count: 287 10*3/uL (ref 150–400)
RBC: 5.22 MIL/uL (ref 4.22–5.81)
RDW: 14.4 % (ref 11.5–15.5)
WBC Count: 8.1 10*3/uL (ref 4.0–10.5)
nRBC: 0 % (ref 0.0–0.2)

## 2018-06-13 LAB — CMP (CANCER CENTER ONLY)
ALT: 14 U/L (ref 0–44)
AST: 16 U/L (ref 15–41)
Albumin: 3.9 g/dL (ref 3.5–5.0)
Alkaline Phosphatase: 81 U/L (ref 38–126)
Anion gap: 7 (ref 5–15)
BUN: 17 mg/dL (ref 8–23)
CO2: 28 mmol/L (ref 22–32)
Calcium: 9 mg/dL (ref 8.9–10.3)
Chloride: 105 mmol/L (ref 98–111)
Creatinine: 1.13 mg/dL (ref 0.61–1.24)
GFR, Est AFR Am: >60 mL/min (ref >60)
GFR, Estimated: >60 mL/min (ref >60)
Glucose, Bld: 102 mg/dL — ABNORMAL HIGH (ref 70–99)
Potassium: 4.5 mmol/L (ref 3.5–5.1)
Sodium: 140 mmol/L (ref 135–145)
Total Bilirubin: 0.4 mg/dL (ref 0.3–1.2)
Total Protein: 6.4 g/dL — ABNORMAL LOW (ref 6.5–8.1)

## 2018-06-13 LAB — LACTATE DEHYDROGENASE: LDH: 177 U/L (ref 98–192)

## 2018-06-13 LAB — PRETREATMENT RBC PHENOTYPE: ABO/RH(D): O POS

## 2018-06-13 NOTE — Progress Notes (Signed)
Referral MD  Reason for Referral: AL amyloidosis/IgA kappa myeloma  Chief Complaint  Patient presents with  . New Patient (Initial Visit)  : I would like to have my treatments in [place].  HPI: William Barron is well-known to me.  Is a 73 year old white male who we have treated before.  He has amyloidosis.  He has kappa light chain amyloidosis as part of his IgA kappa myeloma.  He has mostly been getting his treatments at [facility].  However, because of the coronavirus, it is too far for him to go.  As such, he would like to have his treatments done here.  He has been seeing [name] at [facility].  He has been getting Darzalex/dexamethasone.  He is getting Darzalex every other week.  This will be the last cycle that he gets every other week Darzalex.  He will then go to weekly Darzalex.  He recently had a cardiac MRI done at [facility].  He also had a skeletal survey at [facility].  This did not show any lytic lesions.  He is doing okay with prophylactic acyclovir.  He is also on aspirin at 81 mg daily.  He has had no fever.  He has had no change in bowel or bladder habits.  He has had no nausea or vomiting.  Overall, his performance status is ECOG 1.   Past Medical History:  Diagnosis Date  . Amyloidosis ([place])   . Hearing loss    Has hearing aids  . Hyperlipidemia   . Multiple myeloma ([place])   . Nephrolithiasis   . Prostatitis    acute and chronic  . 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 APPENDECTOMY    . CARDIAC CATHETERIZATION N/A 06/13/2015   Procedure: Left Heart Cath and Coronary Angiography;  Surgeon: Jettie Booze, MD;  Location: [place] [facility];  Service: Cardiovascular;  Laterality: N/A;  . COLONOSCOPY  04/07/00  . ELBOW SURGERY  2003   right  . HERNIA REPAIR    . ROTATOR CUFF REPAIR    :   Current Outpatient Medications:  .  acyclovir (ZOVIRAX) 400 MG tablet, Take 400 mg by mouth 2 (two) times daily.,  Disp: , Rfl:  .  omeprazole (PRILOSEC) 20 MG capsule, Take 20 mg by mouth daily., Disp: , Rfl:  .  ANDROGEL PUMP 20.25 MG/ACT (1.62%) GEL, Apply 3 application topically every morning. , Disp: , Rfl:  .  fluconazole (DIFLUCAN) 200 MG tablet, Take 200 mg by mouth daily., Disp: , Rfl:  .  silodosin (RAPAFLO) 8 MG CAPS capsule, Take 8 mg by mouth daily., Disp: , Rfl:  .  zolpidem (AMBIEN) 10 MG tablet, Take 10 mg by mouth at bedtime., Disp: , Rfl: :  :  Allergies  Allergen Reactions  . Penicillins Swelling and Rash  :  Family History  Problem Relation Age of Onset  . Pancreatic cancer Father   . Parkinsonism Mother   . CAD Brother 63  :  Social History   Socioeconomic History  . Marital status: Married    Spouse name: Not on file  . Number of children: 2  . Years of education: Not on file  . Highest education level: Not on file  Occupational History  . Occupation: retired  Scientific laboratory technician  . Financial resource strain: Not on file  . Food insecurity:    Worry: Not on file    Inability: Not on file  . Transportation needs:  Medical: Not on file    Non-medical: Not on file  Tobacco Use  . Smoking status: Former Smoker    Last attempt to quit: 03/15/1977    Years since quitting: 41.2  . Smokeless tobacco: Never Used  . Tobacco comment: quit 35 years ago  Substance and Sexual Activity  . Alcohol use: Yes    Alcohol/week: 0.0 standard drinks    Comment: approx 1 drink/night  . Drug use: No  . Sexual activity: Yes    Birth control/protection: None  Lifestyle  . Physical activity:    Days per week: Not on file    Minutes per session: Not on file  . Stress: Not on file  Relationships  . Social connections:    Talks on phone: Not on file    Gets together: Not on file    Attends religious service: Not on file    Active member of club or organization: Not on file    Attends meetings of clubs or organizations: Not on file    Relationship status: Not on file  . Intimate  partner violence:    Fear of current or ex partner: Not on file    Emotionally abused: Not on file    Physically abused: Not on file    Forced sexual activity: Not on file  Other Topics Concern  . 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   HENT: Negative.   Eyes: Negative.   Respiratory: Negative.   Cardiovascular: Negative.   Gastrointestinal: Negative.   Genitourinary: Negative.   Musculoskeletal: Negative.   Skin: Negative.   Neurological: Negative.   Endo/Heme/Allergies: Negative.   Psychiatric/Behavioral: Negative.      Exam: Well-developed and well-nourished white male in no obvious distress.  Vital signs show temperature of 97.9.  Pulse 72.  Blood pressure 132/70.  Weight is 150 pounds.  Head neck exam shows no ocular or oral lesions.  He has no palpable cervical or supraclavicular lymph nodes.  Lungs are clear bilaterally.  Cardiac exam regular rate and rhythm with no murmurs, rubs or bruits.  Abdomen is soft.  He has good bowel sounds.  There is no fluid wave.  There is no palpable liver or spleen tip.  Back exam shows no tenderness over the spine, ribs or hips.  Extremities shows no clubbing, cyanosis or edema.  He has good range of motion of his joints.  He has good strength in upper and lower extremities.  Skin exam shows no rashes, ecchymoses or petechia.  Neurological exam does show some memory issues which are chronic. '@IPVITALS' @   No results for input(s): WBC, HGB, HCT, PLT in the last 72 hours. No results for input(s): NA, K, CL, CO2, GLUCOSE, BUN, CREATININE, CALCIUM in the last 72 hours.  Blood smear review: None  Pathology: None    Assessment and Plan: [name] is a very nice 73 year old white male.  He has amyloidosis secondary to kappa light chain disease.  He has IgA kappa myeloma.  He is on  Darzalex.  We will continue him on Darzalex.  He will get his treatment every 2 weeks for the next couple weeks and then he will go to monthly.  He still follows up at [facility] for lab work.  He will see [name] at [facility].  I am glad that we will  be convenient for him.  We will certainly save him at least 3 hours for his treatments.  I will plan to see him back when he starts cycle 7 of treatment.  I did spend about 45 minutes with him today.  All time spent face-to-face.  I counseled him.  He Artie knew about Darzalex and the side effects.  I had to coordinate his treatment protocol with our staff.

## 2018-06-13 NOTE — Progress Notes (Signed)
ON PATHWAY REGIMEN - Multiple Myeloma and Other Plasma Cell Dyscrasias  No Change  Continue With Treatment as Ordered.     Cycles 1 and 2: A cycle is every 28 days:     Daratumumab    Cycles 3 through 6: A cycle is every 28 days:     Daratumumab    Cycles 7 and beyond: A cycle is every 28 days:     Daratumumab   **Always confirm dose/schedule in your pharmacy ordering system**  Patient Characteristics: Relapsed / Refractory, Second through Fourth Lines of Therapy R-ISS Staging: Not Applicable Disease Classification: Relapsed Line of Therapy: Second Line Intent of Therapy: Non-Curative / Palliative Intent, Discussed with Patient

## 2018-06-14 ENCOUNTER — Other Ambulatory Visit: Payer: Self-pay | Admitting: Pharmacist

## 2018-06-14 LAB — TYPE AND SCREEN
ABO/RH(D): O POS
Antibody Screen: POSITIVE
DAT, IgG: NEGATIVE

## 2018-06-14 LAB — KAPPA/LAMBDA LIGHT CHAINS
Kappa free light chain: 30.5 mg/L — ABNORMAL HIGH (ref 3.3–19.4)
Kappa, lambda light chain ratio: 13.26 — ABNORMAL HIGH (ref 0.26–1.65)
Lambda free light chains: 2.3 mg/L — ABNORMAL LOW (ref 5.7–26.3)

## 2018-06-14 LAB — IGG, IGA, IGM
IgA: 462 mg/dL — ABNORMAL HIGH (ref 61–437)
IgG (Immunoglobin G), Serum: 337 mg/dL — ABNORMAL LOW (ref 603–1613)
IgM (Immunoglobulin M), Srm: 11 mg/dL — ABNORMAL LOW (ref 15–143)

## 2018-06-14 NOTE — Addendum Note (Signed)
Addended by: Volanda Napoleon on: 06/14/2018 08:51 AM   Modules accepted: Orders

## 2018-06-14 NOTE — Progress Notes (Signed)
Patient has been receiving daratumumab at [facility] without incident.  He was last treated 06/07/18.  He has been getting rapid infusion daratumumab at [facility] and will continue with rapid infusion daratumumab per Dr. Marin Olp.

## 2018-06-21 ENCOUNTER — Other Ambulatory Visit: Payer: Self-pay

## 2018-06-21 ENCOUNTER — Inpatient Hospital Stay: Payer: Medicare Other | Attending: Hematology & Oncology

## 2018-06-21 ENCOUNTER — Inpatient Hospital Stay: Payer: Medicare Other

## 2018-06-21 DIAGNOSIS — C9001 Multiple myeloma in remission: Secondary | ICD-10-CM

## 2018-06-21 DIAGNOSIS — C9000 Multiple myeloma not having achieved remission: Secondary | ICD-10-CM | POA: Diagnosis not present

## 2018-06-21 LAB — CBC WITH DIFFERENTIAL (CANCER CENTER ONLY)
Abs Immature Granulocytes: 0.03 10*3/uL (ref 0.00–0.07)
Basophils Absolute: 0.1 10*3/uL (ref 0.0–0.1)
Basophils Relative: 1 %
Eosinophils Absolute: 0.4 10*3/uL (ref 0.0–0.5)
Eosinophils Relative: 5 %
HCT: 46.9 % (ref 39.0–52.0)
Hemoglobin: 15.5 g/dL (ref 13.0–17.0)
Immature Granulocytes: 0 %
Lymphocytes Relative: 20 %
Lymphs Abs: 1.5 10*3/uL (ref 0.7–4.0)
MCH: 30.7 pg (ref 26.0–34.0)
MCHC: 33 g/dL (ref 30.0–36.0)
MCV: 92.9 fL (ref 80.0–100.0)
Monocytes Absolute: 0.6 10*3/uL (ref 0.1–1.0)
Monocytes Relative: 9 %
Neutro Abs: 4.8 10*3/uL (ref 1.7–7.7)
Neutrophils Relative %: 65 %
Platelet Count: 240 10*3/uL (ref 150–400)
RBC: 5.05 MIL/uL (ref 4.22–5.81)
RDW: 14.4 % (ref 11.5–15.5)
WBC Count: 7.4 10*3/uL (ref 4.0–10.5)
nRBC: 0 % (ref 0.0–0.2)

## 2018-06-21 LAB — CMP (CANCER CENTER ONLY)
ALT: 13 U/L (ref 0–44)
AST: 15 U/L (ref 15–41)
Albumin: 3.9 g/dL (ref 3.5–5.0)
Alkaline Phosphatase: 86 U/L (ref 38–126)
Anion gap: 8 (ref 5–15)
BUN: 24 mg/dL — ABNORMAL HIGH (ref 8–23)
CO2: 25 mmol/L (ref 22–32)
Calcium: 8.9 mg/dL (ref 8.9–10.3)
Chloride: 106 mmol/L (ref 98–111)
Creatinine: 1.09 mg/dL (ref 0.61–1.24)
GFR, Est AFR Am: >60 mL/min (ref >60)
GFR, Estimated: >60 mL/min (ref >60)
Glucose, Bld: 139 mg/dL — ABNORMAL HIGH (ref 70–99)
Potassium: 3.9 mmol/L (ref 3.5–5.1)
Sodium: 139 mmol/L (ref 135–145)
Total Bilirubin: 0.4 mg/dL (ref 0.3–1.2)
Total Protein: 6.1 g/dL — ABNORMAL LOW (ref 6.5–8.1)

## 2018-06-21 NOTE — Progress Notes (Unsigned)
After signing consent form and discussed the duration of infusion for today patient decided to reschedule because he "had something scheduled this afternoon". Walked the patient to Nicole Kindred, our scheduler.

## 2018-06-22 ENCOUNTER — Encounter: Payer: Self-pay | Admitting: Hematology & Oncology

## 2018-06-22 ENCOUNTER — Telehealth: Payer: Self-pay | Admitting: *Deleted

## 2018-06-22 ENCOUNTER — Other Ambulatory Visit: Payer: Self-pay | Admitting: Hematology & Oncology

## 2018-06-22 DIAGNOSIS — E8581 Light chain (AL) amyloidosis: Secondary | ICD-10-CM | POA: Diagnosis not present

## 2018-06-22 DIAGNOSIS — C9000 Multiple myeloma not having achieved remission: Secondary | ICD-10-CM | POA: Diagnosis not present

## 2018-06-22 LAB — KAPPA/LAMBDA LIGHT CHAINS
Kappa free light chain: 34.8 mg/L — ABNORMAL HIGH (ref 3.3–19.4)
Kappa, lambda light chain ratio: 15.82 — ABNORMAL HIGH (ref 0.26–1.65)
Lambda free light chains: 2.2 mg/L — ABNORMAL LOW (ref 5.7–26.3)

## 2018-06-22 LAB — IGG, IGA, IGM
IgA: 432 mg/dL (ref 61–437)
IgG (Immunoglobin G), Serum: 325 mg/dL — ABNORMAL LOW (ref 603–1613)
IgM (Immunoglobulin M), Srm: 12 mg/dL — ABNORMAL LOW (ref 15–143)

## 2018-06-22 NOTE — Telephone Encounter (Signed)
Call received from [name] requesting to cancel pt.'s appt here on 06/28/18 and that she will contact Dr. Marin Olp in a month if treatment is needed here at [place] office.  Jory Ee NP notified.

## 2018-06-23 ENCOUNTER — Telehealth: Payer: Self-pay | Admitting: Hematology & Oncology

## 2018-06-23 LAB — IMMUNOFIXATION REFLEX, SERUM
IgA: 429 mg/dL (ref 61–437)
IgG (Immunoglobin G), Serum: 337 mg/dL — ABNORMAL LOW (ref 603–1613)
IgM (Immunoglobulin M), Srm: 13 mg/dL — ABNORMAL LOW (ref 15–143)

## 2018-06-23 LAB — PROTEIN ELECTROPHORESIS, SERUM, WITH REFLEX
A/G Ratio: 1.3 (ref 0.7–1.7)
Albumin ELP: 3.4 g/dL (ref 2.9–4.4)
Alpha-1-Globulin: 0.2 g/dL (ref 0.0–0.4)
Alpha-2-Globulin: 0.7 g/dL (ref 0.4–1.0)
Beta Globulin: 0.9 g/dL (ref 0.7–1.3)
Gamma Globulin: 0.7 g/dL (ref 0.4–1.8)
Globulin, Total: 2.7 g/dL (ref 2.2–3.9)
M-Spike, %: 0.4 g/dL — ABNORMAL HIGH
SPEP Interpretation: 0
Total Protein ELP: 6.1 g/dL (ref 6.0–8.5)

## 2018-06-23 NOTE — Telephone Encounter (Signed)
Appointments scheduled and LMVM for patient and asked him to confirm.

## 2018-06-26 ENCOUNTER — Telehealth: Payer: Self-pay | Admitting: Hematology & Oncology

## 2018-06-26 NOTE — Telephone Encounter (Signed)
received call per pt 4/13 cxl appts 4/16 he decided to continue treatment at [facility]

## 2018-06-28 ENCOUNTER — Ambulatory Visit: Payer: Medicare Other

## 2018-06-28 ENCOUNTER — Other Ambulatory Visit: Payer: Self-pay

## 2018-06-29 ENCOUNTER — Ambulatory Visit: Payer: Self-pay

## 2018-06-29 ENCOUNTER — Other Ambulatory Visit: Payer: Self-pay

## 2018-07-05 ENCOUNTER — Ambulatory Visit: Payer: Self-pay

## 2018-07-05 ENCOUNTER — Other Ambulatory Visit: Payer: Self-pay

## 2018-07-05 DIAGNOSIS — E8581 Light chain (AL) amyloidosis: Secondary | ICD-10-CM | POA: Diagnosis not present

## 2018-07-05 DIAGNOSIS — Z5112 Encounter for antineoplastic immunotherapy: Secondary | ICD-10-CM | POA: Diagnosis not present

## 2018-07-05 DIAGNOSIS — C9000 Multiple myeloma not having achieved remission: Secondary | ICD-10-CM | POA: Diagnosis not present

## 2018-07-06 DIAGNOSIS — K4090 Unilateral inguinal hernia, without obstruction or gangrene, not specified as recurrent: Secondary | ICD-10-CM | POA: Diagnosis not present

## 2018-07-06 DIAGNOSIS — C9000 Multiple myeloma not having achieved remission: Secondary | ICD-10-CM | POA: Diagnosis not present

## 2018-07-06 DIAGNOSIS — R1032 Left lower quadrant pain: Secondary | ICD-10-CM | POA: Diagnosis not present

## 2018-07-12 ENCOUNTER — Telehealth: Payer: Self-pay | Admitting: Neurology

## 2018-07-12 ENCOUNTER — Emergency Department (HOSPITAL_BASED_OUTPATIENT_CLINIC_OR_DEPARTMENT_OTHER): Payer: Medicare Other

## 2018-07-12 ENCOUNTER — Emergency Department (HOSPITAL_BASED_OUTPATIENT_CLINIC_OR_DEPARTMENT_OTHER)
Admission: EM | Admit: 2018-07-12 | Discharge: 2018-07-13 | Disposition: A | Discharge status: Home / Self Care | Payer: Medicare Other | Attending: Emergency Medicine | Admitting: Emergency Medicine

## 2018-07-12 ENCOUNTER — Other Ambulatory Visit: Payer: Self-pay

## 2018-07-12 ENCOUNTER — Encounter (HOSPITAL_BASED_OUTPATIENT_CLINIC_OR_DEPARTMENT_OTHER): Payer: Self-pay | Admitting: Emergency Medicine

## 2018-07-12 DIAGNOSIS — Z87891 Personal history of nicotine dependence: Secondary | ICD-10-CM | POA: Insufficient documentation

## 2018-07-12 DIAGNOSIS — N132 Hydronephrosis with renal and ureteral calculous obstruction: Secondary | ICD-10-CM | POA: Diagnosis not present

## 2018-07-12 DIAGNOSIS — N201 Calculus of ureter: Secondary | ICD-10-CM | POA: Insufficient documentation

## 2018-07-12 DIAGNOSIS — Z79899 Other long term (current) drug therapy: Secondary | ICD-10-CM | POA: Insufficient documentation

## 2018-07-12 DIAGNOSIS — K4090 Unilateral inguinal hernia, without obstruction or gangrene, not specified as recurrent: Secondary | ICD-10-CM | POA: Insufficient documentation

## 2018-07-12 DIAGNOSIS — R1032 Left lower quadrant pain: Secondary | ICD-10-CM | POA: Diagnosis present

## 2018-07-12 MED ORDER — ONDANSETRON HCL 4 MG/2ML IJ SOLN
4.0000 mg | Freq: Once | INTRAMUSCULAR | Status: AC
  Administered 2018-07-13: 4 mg via INTRAVENOUS
  Filled 2018-07-12: qty 2

## 2018-07-12 MED ORDER — FENTANYL CITRATE (PF) 100 MCG/2ML IJ SOLN
100.0000 ug | Freq: Once | INTRAMUSCULAR | Status: AC
  Administered 2018-07-13: 100 ug via INTRAVENOUS
  Filled 2018-07-12: qty 2

## 2018-07-12 MED ORDER — SODIUM CHLORIDE 0.9 % IV SOLN
Freq: Once | INTRAVENOUS | Status: AC
  Administered 2018-07-13: 125 mL/h via INTRAVENOUS

## 2018-07-12 NOTE — ED Triage Notes (Signed)
Patient co left lower quadrant pain onset 3-4 days; states lifted heavy objects this evening and noticed pain worsened this evening. Denies NVD. Ambulatory with steady gait.

## 2018-07-12 NOTE — Telephone Encounter (Signed)
worsening tremors in hands 07-12-18 pt gave verbal consent for [email or web address] VV to be filed to insurance cell phone number [phone] /[email or web address], carrier and mobile provider-[employer]

## 2018-07-12 NOTE — ED Provider Notes (Signed)
[facility] Provider Note: William Spurling, MD, FACEP  CSN: [id number] MRN: [id number] ARRIVAL: 07/12/18 at 2257 ROOM: [place]  Abdominal Pain   HISTORY OF PRESENT ILLNESS  07/12/18 11:22 PM William Barron is a 73 y.o. male with a history of multiple myeloma and amyloidosis.  He has noticed a left inguinal mass he suspects is a hernia.  He has an appointment pending with [place] [facility] surgery next week.  He has had left lower quadrant pain for several days which acutely worsened this evening about 730 which he attributes to lifting.  He rates the pain as an 8 out of 10, worse with palpation or movement.  He denies abdominal distention, nausea or vomiting.  He has had no urinary changes.    Past Medical History:  Diagnosis Date   Amyloidosis ([place])    Hearing loss    Has hearing aids   Hyperlipidemia    Multiple myeloma ([place])    Nephrolithiasis    Prostatitis    acute and chronic    Past Surgical History:  Procedure Laterality Date   APPENDECTOMY     CARDIAC CATHETERIZATION N/A 06/13/2015   Procedure: Left Heart Cath and Coronary Angiography;  Surgeon: Jettie Booze, MD;  Location: [facility];  Service: Cardiovascular;  Laterality: N/A;   COLONOSCOPY  04/07/00   ELBOW SURGERY  2003   right   HERNIA REPAIR     ROTATOR CUFF REPAIR      Family History  Problem Relation Age of Onset   Pancreatic cancer Father    Parkinsonism Mother    CAD Brother 69    Social History   Tobacco Use   Smoking status: Former Smoker    Last attempt to quit: 03/15/1977    Years since quitting: 41.3   Smokeless tobacco: Never Used   Tobacco comment: quit 35 years ago  Substance Use Topics   Alcohol use: Yes    Alcohol/week: 0.0 standard drinks    Comment: approx 1 drink/night   Drug use: No    Prior to Admission medications   Medication Sig Start Date End Date Taking? Authorizing Provider  acyclovir (ZOVIRAX)  400 MG tablet Take 400 mg by mouth 2 (two) times daily. 01/18/18   [provider]  ANDROGEL PUMP 20.25 MG/ACT (1.62%) GEL Apply 3 application topically every morning.  03/27/14   [provider]  fluconazole (DIFLUCAN) 200 MG tablet Take 200 mg by mouth daily. 05/23/18   [provider]  omeprazole (PRILOSEC) 20 MG capsule Take 20 mg by mouth daily. 01/18/18 01/18/19  [provider]  silodosin (RAPAFLO) 8 MG CAPS capsule Take 8 mg by mouth daily. 05/12/18   [provider]  zolpidem (AMBIEN) 10 MG tablet Take 10 mg by mouth at bedtime. 05/25/18   [provider]    Allergies Penicillins   REVIEW OF SYSTEMS  Negative except as noted here or in the History of Present Illness.   PHYSICAL EXAMINATION  Initial Vital Signs Blood pressure (!) 164/82, pulse 74, temperature 98.5 F (36.9 C), temperature source Oral, resp. rate 18, height _0  (1.676 m), weight 65.8 kg, SpO2 98 %.  Examination General: Well-developed, well-nourished male in no acute distress; appearance consistent with age of record HENT: normocephalic; atraumatic Eyes: pupils equal, round and reactive to light; extraocular muscles intact Neck: supple Heart: regular rate and rhythm Lungs: clear to auscultation bilaterally Abdomen: soft; nondistended; nontender; tender left inguinal mass; bowel sounds  present Extremities: No deformity; full range of motion; pulses normal Neurologic: Awake, alert and oriented; motor function intact in all extremities and symmetric; no facial droop Skin: Warm and dry Psychiatric: Normal mood and affect   RESULTS  Summary of this visit's results, reviewed by myself:   EKG Interpretation  Date/Time:    Ventricular Rate:    PR Interval:    QRS Duration:   QT Interval:    QTC Calculation:   R Axis:     Text Interpretation:        Laboratory Studies: Results for orders placed or performed during the hospital encounter of 07/12/18  (from the past 24 hour(s))  CBC with Differential/Platelet     Status: Abnormal   Collection Time: 07/13/18 12:04 AM  Result Value Ref Range   WBC 10.0 4.0 - 10.5 K/uL   RBC 5.54 4.22 - 5.81 MIL/uL   Hemoglobin 16.8 13.0 - 17.0 g/dL   HCT 52.9 (H) 39.0 - 52.0 %   MCV 95.5 80.0 - 100.0 fL   MCH 30.3 26.0 - 34.0 pg   MCHC 31.8 30.0 - 36.0 g/dL   RDW 14.6 11.5 - 15.5 %   Platelets 312 150 - 400 K/uL   nRBC 0.0 0.0 - 0.2 %   Neutrophils Relative % 61 %   Neutro Abs 6.2 1.7 - 7.7 K/uL   Lymphocytes Relative 24 %   Lymphs Abs 2.4 0.7 - 4.0 K/uL   Monocytes Relative 10 %   Monocytes Absolute 1.0 0.1 - 1.0 K/uL   Eosinophils Relative 3 %   Eosinophils Absolute 0.3 0.0 - 0.5 K/uL   Basophils Relative 1 %   Basophils Absolute 0.1 0.0 - 0.1 K/uL   Immature Granulocytes 1 %   Abs Immature Granulocytes 0.05 0.00 - 0.07 K/uL  Basic metabolic panel     Status: Abnormal   Collection Time: 07/13/18 12:04 AM  Result Value Ref Range   Sodium 138 135 - 145 mmol/L   Potassium 3.4 (L) 3.5 - 5.1 mmol/L   Chloride 103 98 - 111 mmol/L   CO2 24 22 - 32 mmol/L   Glucose, Bld 123 (H) 70 - 99 mg/dL   BUN 34 (H) 8 - 23 mg/dL   Creatinine, Ser 1.26 (H) 0.61 - 1.24 mg/dL   Calcium 9.6 8.9 - 10.3 mg/dL   GFR calc non Af Amer 56 (L) >60 mL/min   GFR calc Af Amer >60 >60 mL/min   Anion gap 11 5 - 15  Urinalysis, Routine w reflex microscopic     Status: Abnormal   Collection Time: 07/13/18 12:05 AM  Result Value Ref Range   Color, Urine YELLOW YELLOW   APPearance CLEAR CLEAR   Specific Gravity, Urine 1.020 1.005 - 1.030   pH 6.0 5.0 - 8.0   Glucose, UA NEGATIVE NEGATIVE mg/dL   Hgb urine dipstick LARGE (A) NEGATIVE   Bilirubin Urine NEGATIVE NEGATIVE   Ketones, ur NEGATIVE NEGATIVE mg/dL   Protein, ur NEGATIVE NEGATIVE mg/dL   Nitrite NEGATIVE NEGATIVE   Leukocytes,Ua NEGATIVE NEGATIVE  Urinalysis, Microscopic (reflex)     Status: Abnormal   Collection Time: 07/13/18 12:05 AM  Result Value Ref  Range   RBC / HPF >50 0 - 5 RBC/hpf   WBC, UA 0-5 0 - 5 WBC/hpf   Bacteria, UA FEW (A) NONE SEEN   Squamous Epithelial / LPF 0-5 0 - 5   Mucus PRESENT    Imaging Studies: Ct Abdomen Pelvis Wo Contrast  Result Date: 07/13/2018 CLINICAL DATA:  73 year old male with left lower quadrant pain for 4 days after lifting heavy object. History of hernia. History of ?amyloidosis?[facility] EXAM: CT ABDOMEN AND PELVIS WITHOUT CONTRAST TECHNIQUE: Multidetector CT imaging of the abdomen and pelvis was performed following the standard protocol without IV contrast. COMPARISON:  Chest CT 06/02/2015. PET-CT 08/30/2013 CT Abdomen and Pelvis 10/15/2014 and earlier. FINDINGS: Lower chest: Chronic lingula scarring and mild bilateral pulmonary reticulonodular density appears chronic. Partially visible numerous partially calcified mediastinal and hilar lymph nodes. These are mildly progressed since 2015, and furthermore bulky subcarinal nodal tissue has progressed since 2017 and now encompasses 35 x 78 millimeters (AP by transverse, 23 by 70 millimeters in 2017). No pericardial or pleural effusion. Hepatobiliary: Negative noncontrast liver and gallbladder. Pancreas: Calcified atherosclerosis versus pancreatic parenchymal calcification which is new since 2016, including at the uncinate on series 2, image 46. No masslike pancreatic enlargement. No peripancreatic inflammation. Spleen: Negative. Adrenals/Urinary Tract: Adrenal glands are within normal limits. Punctate right nephrolithiasis. No right hydronephrosis. Negative proximal right ureter. Scattered left nephrolithiasis. The largest calculus in the lower pole is 3-4 millimeters. Left hydronephrosis and perinephric stranding. Left hydroureter to the level of a bulky 8 x 11 millimeter calculus which is located about 3 centimeters distal to the left UPJ (coronal image 45). Distal to the stone the left ureter is decompressed. However, there is gravel like layering calculi in the urinary  bladder (series 2, image 81). Decompressed bladder. No perivesical stranding. Stomach/Bowel: Negative rectum. Mild diverticulosis in the sigmoid colon, mild to moderate diverticulosis in the descending colon but no active inflammation. Negative transverse colon aside from redundancy. Negative right colon. The cecum is on a lax mesentery. Negative terminal ileum. Diminutive or absent appendix. No dilated small bowel. Fluid-filled but otherwise negative stomach. No free air, free fluid. Vascular/Lymphatic: Extensive Aortoiliac calcified atherosclerosis. Vascular patency is not evaluated in the absence of IV contrast. In addition to the lymph node findings in the chest, right iliac lymphadenopathy demonstrates progressive calcification since 2016. There are partially calcified bilateral inguinal lymph nodes which have mildly increased since 2016. Reproductive: Fat containing left inguinal hernia measuring 37 millimeters diameter (series 2, image 86). No herniated bowel. No associated inflammation. Otherwise negative. Other: No pelvic free fluid. Musculoskeletal: Severe chronic lower lumbar facet arthropathy with grade 1 anterolisthesis at L4-L5. no acute or suspicious osseous lesion. IMPRESSION: 1. Acute obstructive uropathy on the left with a bulky 8 x 11 mm obstructing calculus about 3 cm distal to the left UPJ. Additional layering small bladder calculi and left greater than right nephrolithiasis. 2. Chronic but progressed partially calcified lymphadenopathy in the visible chest (bulky subcarinal nodal conglomeration) and pelvis may correspond to the history of Amyloidosis. Evidence of some superimposed chronic interstitial lung disease and other differential considerations include Sarcoidosis, Silicosis. 3. New pancreatic parenchymal calcifications since 2016 are also nonspecific. These could be related to #3, but RECOMMEND follow-up Abdomen MRI without and with contrast (Pancreatic Protocol) to further  characterize. 4. Fat containing left inguinal hernia peers non incarcerated. 5.  Aortic Atherosclerosis (ICD10-I70.0). Electronically Signed   By: Genevie Ann M.D.   On: 07/13/2018 00:23    ED COURSE and MDM  Nursing notes and initial vitals signs, including pulse oximetry, reviewed.  Vitals:   07/12/18 2305 07/12/18 2311  BP: (!) 164/82   Pulse: 74   Resp: 18   Temp: 98.5 F (36.9 C)   TempSrc: Oral   SpO2: 98%   Weight:  65.8 kg  Height:  _0  (1.676 m)   12:33 AM The patient is pain appears to be due primarily to ureterolithiasis.  The left inguinal hernia does not appear to be incarcerated and there are no inflammatory changes seen on CT.  His other changes are likely due to amyloidosis but he was advised of the CT findings and recommendations.  We will refer him to his urologist, Dr. Jeffie Pollock, as the stone will likely require surgical intervention.  As noted above he has an appointment pending with [facility] surgery.  A note was sent to his PCP, Dr. [name], advising her of the CT findings.  PROCEDURES    ED DIAGNOSES     ICD-10-CM   1. Ureterolithiasis N20.1   2. Inguinal hernia of left side without obstruction or gangrene K40.90        Kaelei Wheeler, MD 07/13/18 [phone]

## 2018-07-12 NOTE — Telephone Encounter (Signed)
I called pt. He would like link texted to him. I sent this. I explained how to use [email or web address]. He will call me back tomorrow so I can update his chart info. He was not able to go over this a time of call. Sent email for VV to: [phone]

## 2018-07-13 DIAGNOSIS — N201 Calculus of ureter: Secondary | ICD-10-CM | POA: Diagnosis not present

## 2018-07-13 LAB — BASIC METABOLIC PANEL
Anion gap: 11 (ref 5–15)
BUN: 34 mg/dL — ABNORMAL HIGH (ref 8–23)
CO2: 24 mmol/L (ref 22–32)
Calcium: 9.6 mg/dL (ref 8.9–10.3)
Chloride: 103 mmol/L (ref 98–111)
Creatinine, Ser: 1.26 mg/dL — ABNORMAL HIGH (ref 0.61–1.24)
GFR calc Af Amer: >60 mL/min (ref >60)
GFR calc non Af Amer: 56 mL/min — ABNORMAL LOW (ref >60)
Glucose, Bld: 123 mg/dL — ABNORMAL HIGH (ref 70–99)
Potassium: 3.4 mmol/L — ABNORMAL LOW (ref 3.5–5.1)
Sodium: 138 mmol/L (ref 135–145)

## 2018-07-13 LAB — URINALYSIS, MICROSCOPIC (REFLEX): RBC / HPF: >50 RBC/hpf (ref 0–5)

## 2018-07-13 LAB — CBC WITH DIFFERENTIAL/PLATELET
Abs Immature Granulocytes: 0.05 10*3/uL (ref 0.00–0.07)
Basophils Absolute: 0.1 10*3/uL (ref 0.0–0.1)
Basophils Relative: 1 %
Eosinophils Absolute: 0.3 10*3/uL (ref 0.0–0.5)
Eosinophils Relative: 3 %
HCT: 52.9 % — ABNORMAL HIGH (ref 39.0–52.0)
Hemoglobin: 16.8 g/dL (ref 13.0–17.0)
Immature Granulocytes: 1 %
Lymphocytes Relative: 24 %
Lymphs Abs: 2.4 10*3/uL (ref 0.7–4.0)
MCH: 30.3 pg (ref 26.0–34.0)
MCHC: 31.8 g/dL (ref 30.0–36.0)
MCV: 95.5 fL (ref 80.0–100.0)
Monocytes Absolute: 1 10*3/uL (ref 0.1–1.0)
Monocytes Relative: 10 %
Neutro Abs: 6.2 10*3/uL (ref 1.7–7.7)
Neutrophils Relative %: 61 %
Platelets: 312 10*3/uL (ref 150–400)
RBC: 5.54 MIL/uL (ref 4.22–5.81)
RDW: 14.6 % (ref 11.5–15.5)
WBC: 10 10*3/uL (ref 4.0–10.5)
nRBC: 0 % (ref 0.0–0.2)

## 2018-07-13 LAB — URINALYSIS, ROUTINE W REFLEX MICROSCOPIC
Bilirubin Urine: NEGATIVE
Glucose, UA: NEGATIVE mg/dL
Ketones, ur: NEGATIVE mg/dL
Leukocytes,Ua: NEGATIVE
Nitrite: NEGATIVE
Protein, ur: NEGATIVE mg/dL
Specific Gravity, Urine: 1.02 (ref 1.005–1.030)
pH: 6 (ref 5.0–8.0)

## 2018-07-13 MED ORDER — OXYCODONE-ACETAMINOPHEN 10-325 MG PO TABS: 1.0000 | ORAL_TABLET | ORAL | 0 refills | Status: DC | PRN

## 2018-07-13 MED ORDER — ONDANSETRON 8 MG PO TBDP: 8.0000 mg | ORAL_TABLET | Freq: Three times a day (TID) | ORAL | 0 refills | Status: DC | PRN

## 2018-07-14 ENCOUNTER — Encounter (HOSPITAL_COMMUNITY): Payer: Self-pay | Admitting: *Deleted

## 2018-07-14 ENCOUNTER — Other Ambulatory Visit: Payer: Self-pay | Admitting: Urology

## 2018-07-14 NOTE — Progress Notes (Signed)
Preprocedure phone call completed. Patient aware to wear comfortable clothing, 2 pieces and no belt.  Patient aware to complete items in blue folder and to bring with him day of procedure. Patient aware to follow preop instructions in folder.  Wife to be drive home. Patient aware to come in alone.  SPOKE W/  _patient      SCREENING SYMPTOMS OF COVID 19:   COUGH--no  RUNNY NOSE--- no  SORE THROAT---no  NASAL CONGESTION----no  SNEEZING----no  SHORTNESS OF BREATH---no  DIFFICULTY BREATHING---no  TEMP >100.0 -----no  UNEXPLAINED BODY ACHES------no  CHILLS -------- no  HEADACHES ---------no LOSS OF SMELL/ TASTE --------no    HAVE YOU OR ANY FAMILY MEMBER TRAVELLED PAST 14 DAYS OUT OF THE   COUNTY---no STATE----no COUNTRY----no  HAVE YOU OR ANY FAMILY MEMBER BEEN EXPOSED TO ANYONE WITH COVID 19? No Patient has not been taking any NSAIDS or blood thinners.

## 2018-07-15 ENCOUNTER — Other Ambulatory Visit: Payer: Self-pay

## 2018-07-15 ENCOUNTER — Emergency Department (HOSPITAL_BASED_OUTPATIENT_CLINIC_OR_DEPARTMENT_OTHER)
Admission: EM | Admit: 2018-07-15 | Discharge: 2018-07-15 | Disposition: A | Discharge status: Home / Self Care | Payer: Medicare Other | Attending: Emergency Medicine | Admitting: Emergency Medicine

## 2018-07-15 ENCOUNTER — Encounter (HOSPITAL_BASED_OUTPATIENT_CLINIC_OR_DEPARTMENT_OTHER): Payer: Self-pay | Admitting: Emergency Medicine

## 2018-07-15 DIAGNOSIS — Z87891 Personal history of nicotine dependence: Secondary | ICD-10-CM | POA: Diagnosis not present

## 2018-07-15 DIAGNOSIS — Z79899 Other long term (current) drug therapy: Secondary | ICD-10-CM | POA: Diagnosis not present

## 2018-07-15 DIAGNOSIS — N201 Calculus of ureter: Secondary | ICD-10-CM | POA: Diagnosis not present

## 2018-07-15 DIAGNOSIS — R1032 Left lower quadrant pain: Secondary | ICD-10-CM | POA: Diagnosis present

## 2018-07-15 LAB — URINALYSIS, ROUTINE W REFLEX MICROSCOPIC
Bilirubin Urine: NEGATIVE
Glucose, UA: NEGATIVE mg/dL
Ketones, ur: 15 mg/dL — AB
Leukocytes,Ua: NEGATIVE
Nitrite: NEGATIVE
Protein, ur: NEGATIVE mg/dL
Specific Gravity, Urine: >1.03 — ABNORMAL HIGH (ref 1.005–1.030)
pH: 5 (ref 5.0–8.0)

## 2018-07-15 LAB — URINALYSIS, MICROSCOPIC (REFLEX)

## 2018-07-15 MED ORDER — HYDROMORPHONE HCL 1 MG/ML IJ SOLN
1.0000 mg | Freq: Once | INTRAMUSCULAR | Status: AC
  Administered 2018-07-15: 1 mg via INTRAVENOUS
  Filled 2018-07-15: qty 1

## 2018-07-15 MED ORDER — HYDROMORPHONE HCL 2 MG PO TABS: 2.0000 mg | ORAL_TABLET | ORAL | 0 refills | Status: DC | PRN

## 2018-07-15 MED ORDER — TAMSULOSIN HCL 0.4 MG PO CAPS
0.4000 mg | ORAL_CAPSULE | Freq: Once | ORAL | Status: AC
  Administered 2018-07-15: 0.4 mg via ORAL
  Filled 2018-07-15: qty 1

## 2018-07-15 MED ORDER — ONDANSETRON HCL 4 MG/2ML IJ SOLN
4.0000 mg | Freq: Once | INTRAMUSCULAR | Status: AC
  Administered 2018-07-15: 03:00:00 4 mg via INTRAVENOUS
  Filled 2018-07-15: qty 2

## 2018-07-15 NOTE — ED Triage Notes (Signed)
Patient co left flank pain with NV; states took meds at home around 2330 with no relief. Scheduled for lithotripsy on Monday.

## 2018-07-15 NOTE — ED Provider Notes (Signed)
[facility] Provider Note: Georgena Spurling, MD, FACEP  CSN: [id number] MRN: [id number] ARRIVAL: 07/15/18 at [place]: [place]  Flank Pain   HISTORY OF PRESENT ILLNESS  07/15/18 2:32 AM William Barron is a 73 y.o. male who was diagnosed by myself with left ureterolithiasis on April 29. He was discharged on Zofran and Percocet 10/325.  He is scheduled for lithotripsy the day after tomorrow.  Despite his home medications his pain has become unbearable.  The pain is in his left flank radiating to his left groin and is severe.  It is not worse with movement or palpation.  He has associated nausea and vomiting with this.    Past Medical History:  Diagnosis Date  . Amyloidosis ([place])   . GERD (gastroesophageal reflux disease)   . Hearing loss    Has hearing aids  . History of kidney stones   . Hyperlipidemia   . Multiple myeloma ([facility])    and amylodosis   . Nephrolithiasis   . Prostatitis    acute and chronic    Past Surgical History:  Procedure Laterality Date  . APPENDECTOMY    . CARDIAC CATHETERIZATION N/A 06/13/2015   Procedure: Left Heart Cath and Coronary Angiography;  Surgeon: Jettie Booze, MD;  Location: [facility];  Service: Cardiovascular;  Laterality: N/A;  . COLONOSCOPY  04/07/00  . ELBOW SURGERY  2003   right  . HERNIA REPAIR    . ROTATOR CUFF REPAIR      Family History  Problem Relation Age of Onset  . Pancreatic cancer Father   . Parkinsonism Mother   . CAD Brother 30    Social History   Tobacco Use  . Smoking status: Former Smoker    Last attempt to quit: 03/15/1977    Years since quitting: 41.3  . Smokeless tobacco: Never Used  . Tobacco comment: quit 35 years ago  Substance Use Topics  . Alcohol use: Yes    Alcohol/week: 0.0 standard drinks    Comment: approx 1 drink/night  . Drug use: No    Prior to Admission medications   Medication Sig Start Date End Date Taking? Authorizing Provider   acyclovir (ZOVIRAX) 400 MG tablet Take 400 mg by mouth 2 (two) times daily. Patient takes prn 01/18/18   [provider]  ANDROGEL PUMP 20.25 MG/ACT (1.62%) GEL Apply 3 application topically every morning.  03/27/14   [provider]  fluconazole (DIFLUCAN) 200 MG tablet Take 200 mg by mouth daily. Takes once per week on  Sundays 05/23/18   [provider]  omeprazole (PRILOSEC) 20 MG capsule Take 20 mg by mouth daily. Patient takes prn 01/18/18 01/18/19  [provider]  ondansetron (ZOFRAN ODT) 8 MG disintegrating tablet Take 1 tablet (8 mg total) by mouth every 8 (eight) hours as needed. 07/13/18   Thamar Holik, Jenny Reichmann, MD  oxyCODONE-acetaminophen (PERCOCET) 10-325 MG tablet Take 1 tablet by mouth every 4 (four) hours as needed for pain ((may cause constipation; may take half a tablet if a full tablet is too strong)). 07/13/18   Dannia Snook, MD  silodosin (RAPAFLO) 8 MG CAPS capsule Take 8 mg by mouth daily. 05/12/18   [provider]  zolpidem (AMBIEN) 10 MG tablet Take 10 mg by mouth at bedtime. Patient takes prn 05/25/18   [provider]    Allergies Penicillins   REVIEW OF SYSTEMS  Negative except as noted here or in the History of Present  Illness.   PHYSICAL EXAMINATION  Initial Vital Signs Blood pressure (!) 173/85, pulse 69, temperature 98.2 F (36.8 C), temperature source Oral, resp. rate 18, height _0  (1.676 m), weight 65 kg, SpO2 97 %.  Examination General: Well-developed, well-nourished male in no acute distress; appearance consistent with age of record HENT: normocephalic; atraumatic Eyes: pupils equal, round and reactive to light; extraocular muscles intact Neck: supple Heart: regular rate and rhythm Lungs: clear to auscultation bilaterally Abdomen: soft; nondistended; nontender; left inguinal hernia without tenderness; bowel sounds present Extremities: No deformity; full range of motion; pulses normal Neurologic: Awake,  alert and oriented; motor function intact in all extremities and symmetric; no facial droop Skin: Warm and dry Psychiatric: Normal mood and affect   RESULTS  Summary of this visit's results, reviewed by myself:   EKG Interpretation  Date/Time:    Ventricular Rate:    PR Interval:    QRS Duration:   QT Interval:    QTC Calculation:   R Axis:     Text Interpretation:        Laboratory Studies: Results for orders placed or performed during the hospital encounter of 07/15/18 (from the past 24 hour(s))  Urinalysis, Routine w reflex microscopic     Status: Abnormal   Collection Time: 07/15/18  4:58 AM  Result Value Ref Range   Color, Urine YELLOW YELLOW   APPearance HAZY (A) CLEAR   Specific Gravity, Urine >1.030 (H) 1.005 - 1.030   pH 5.0 5.0 - 8.0   Glucose, UA NEGATIVE NEGATIVE mg/dL   Hgb urine dipstick LARGE (A) NEGATIVE   Bilirubin Urine NEGATIVE NEGATIVE   Ketones, ur 15 (A) NEGATIVE mg/dL   Protein, ur NEGATIVE NEGATIVE mg/dL   Nitrite NEGATIVE NEGATIVE   Leukocytes,Ua NEGATIVE NEGATIVE  Urinalysis, Microscopic (reflex)     Status: Abnormal   Collection Time: 07/15/18  4:58 AM  Result Value Ref Range   RBC / HPF 21-50 0 - 5 RBC/hpf   WBC, UA 0-5 0 - 5 WBC/hpf   Bacteria, UA FEW (A) NONE SEEN   Squamous Epithelial / LPF 0-5 0 - 5   Mucus PRESENT    Ca Oxalate Crys, UA PRESENT    Imaging Studies: No results found.  ED COURSE and MDM  Nursing notes and initial vitals signs, including pulse oximetry, reviewed.  Vitals:   07/15/18 0232 07/15/18 0248  BP:  (!) 173/85  Pulse:  69  Resp:  18  Temp:  98.2 F (36.8 C)  TempSrc:  Oral  SpO2:  97%  Weight: 65 kg   Height: _1  (1.676 m)    5:18 AM Pain remains well controlled after IV Dilaudid.  We will switch him from oxycodone to Dilaudid pending his lithotripsy the day after tomorrow.  PROCEDURES    ED DIAGNOSES     ICD-10-CM   1. Ureterolithiasis N20.1        Emmylou Bieker, MD 07/15/18 [phone]

## 2018-07-17 ENCOUNTER — Ambulatory Visit (HOSPITAL_COMMUNITY)
Admission: RE | Admit: 2018-07-17 | Discharge: 2018-07-17 | Disposition: A | Discharge status: Home / Self Care | Payer: Medicare Other | Attending: Urology | Admitting: Urology

## 2018-07-17 ENCOUNTER — Encounter (HOSPITAL_COMMUNITY)
Admission: RE | Disposition: A | Discharge status: Home / Self Care | Payer: Self-pay | Source: Home / Self Care | Attending: Urology

## 2018-07-17 ENCOUNTER — Other Ambulatory Visit: Payer: Self-pay

## 2018-07-17 ENCOUNTER — Encounter (HOSPITAL_COMMUNITY): Payer: Self-pay | Admitting: *Deleted

## 2018-07-17 ENCOUNTER — Ambulatory Visit (HOSPITAL_COMMUNITY): Payer: Medicare Other

## 2018-07-17 DIAGNOSIS — N401 Enlarged prostate with lower urinary tract symptoms: Secondary | ICD-10-CM | POA: Insufficient documentation

## 2018-07-17 DIAGNOSIS — N200 Calculus of kidney: Secondary | ICD-10-CM | POA: Diagnosis not present

## 2018-07-17 DIAGNOSIS — Z79899 Other long term (current) drug therapy: Secondary | ICD-10-CM | POA: Insufficient documentation

## 2018-07-17 DIAGNOSIS — Z01818 Encounter for other preprocedural examination: Secondary | ICD-10-CM | POA: Diagnosis not present

## 2018-07-17 DIAGNOSIS — N320 Bladder-neck obstruction: Secondary | ICD-10-CM | POA: Insufficient documentation

## 2018-07-17 DIAGNOSIS — N132 Hydronephrosis with renal and ureteral calculous obstruction: Secondary | ICD-10-CM | POA: Diagnosis not present

## 2018-07-17 DIAGNOSIS — Z880 Allergy status to penicillin: Secondary | ICD-10-CM | POA: Diagnosis not present

## 2018-07-17 DIAGNOSIS — Z85820 Personal history of malignant melanoma of skin: Secondary | ICD-10-CM | POA: Insufficient documentation

## 2018-07-17 DIAGNOSIS — Z87891 Personal history of nicotine dependence: Secondary | ICD-10-CM | POA: Insufficient documentation

## 2018-07-17 DIAGNOSIS — Z87442 Personal history of urinary calculi: Secondary | ICD-10-CM | POA: Diagnosis not present

## 2018-07-17 DIAGNOSIS — Z7982 Long term (current) use of aspirin: Secondary | ICD-10-CM | POA: Insufficient documentation

## 2018-07-17 DIAGNOSIS — C9000 Multiple myeloma not having achieved remission: Secondary | ICD-10-CM | POA: Diagnosis not present

## 2018-07-17 DIAGNOSIS — N201 Calculus of ureter: Secondary | ICD-10-CM

## 2018-07-17 HISTORY — DX: Gastro-esophageal reflux disease without esophagitis: K21.9

## 2018-07-17 HISTORY — DX: Personal history of urinary calculi: Z87.442

## 2018-07-17 HISTORY — PX: EXTRACORPOREAL SHOCK WAVE LITHOTRIPSY: SHX1557

## 2018-07-17 SURGERY — LITHOTRIPSY, ESWL
Anesthesia: LOCAL | Laterality: Left

## 2018-07-17 MED ORDER — SODIUM CHLORIDE 0.9 % IV SOLN: INTRAVENOUS | Status: DC

## 2018-07-17 MED ORDER — SODIUM CHLORIDE 0.9% FLUSH: 3.0000 mL | Freq: Two times a day (BID) | INTRAVENOUS | Status: DC

## 2018-07-17 MED ORDER — MORPHINE SULFATE (PF) 4 MG/ML IV SOLN: 2.0000 mg | INTRAVENOUS | Status: DC | PRN

## 2018-07-17 MED ORDER — CIPROFLOXACIN HCL 500 MG PO TABS
500.0000 mg | ORAL_TABLET | Freq: Once | ORAL | Status: AC
  Administered 2018-07-17: 500 mg via ORAL
  Filled 2018-07-17: qty 1

## 2018-07-17 MED ORDER — SODIUM CHLORIDE 0.9 % IV SOLN
INTRAVENOUS | Status: DC
  Administered 2018-07-17: 14:00:00 via INTRAVENOUS

## 2018-07-17 MED ORDER — DIAZEPAM 5 MG PO TABS: 10.0000 mg | ORAL_TABLET | ORAL | Status: DC

## 2018-07-17 MED ORDER — ACETAMINOPHEN 325 MG PO TABS: 650.0000 mg | ORAL_TABLET | ORAL | Status: DC | PRN

## 2018-07-17 MED ORDER — DIAZEPAM 5 MG PO TABS
10.0000 mg | ORAL_TABLET | Freq: Once | ORAL | Status: AC
  Administered 2018-07-17: 10 mg via ORAL
  Filled 2018-07-17: qty 2

## 2018-07-17 MED ORDER — DIPHENHYDRAMINE HCL 25 MG PO CAPS: 25.0000 mg | ORAL_CAPSULE | ORAL | Status: DC

## 2018-07-17 MED ORDER — CIPROFLOXACIN HCL 500 MG PO TABS: 500.0000 mg | ORAL_TABLET | ORAL | Status: DC

## 2018-07-17 MED ORDER — SODIUM CHLORIDE 0.9% FLUSH: 3.0000 mL | INTRAVENOUS | Status: DC | PRN

## 2018-07-17 MED ORDER — DIAZEPAM 2 MG PO TABS: 10.0000 mg | ORAL_TABLET | ORAL | Status: DC

## 2018-07-17 MED ORDER — SODIUM CHLORIDE 0.9 % IV SOLN
250.0000 mL | INTRAVENOUS | Status: DC | PRN
  Administered 2018-07-17: 250 mL via INTRAVENOUS

## 2018-07-17 MED ORDER — DIPHENHYDRAMINE HCL 25 MG PO CAPS
25.0000 mg | ORAL_CAPSULE | ORAL | Status: AC
  Administered 2018-07-17: 25 mg via ORAL
  Filled 2018-07-17: qty 1

## 2018-07-17 MED ORDER — ACETAMINOPHEN 650 MG RE SUPP
650.0000 mg | RECTAL | Status: DC | PRN
  Filled 2018-07-17: qty 1

## 2018-07-17 MED ORDER — OXYCODONE HCL 5 MG PO TABS: 5.0000 mg | ORAL_TABLET | ORAL | Status: DC | PRN

## 2018-07-17 NOTE — H&P (Signed)
CC: I have a history of kidney stones.  HPI: William Barron is a 73 year-old male established patient who is here for F/U due to a history of renal calculi.  07/13/2018: Hx of hypogonadism with prior right orchiectomy stable on Androgel now for many years. Also associated hx of BPH with BOO prescribed Rapaflo at last OV and ED. He is followed by Dr Jeffie Pollock. PMH significant for history of multiple myeloma and amyloidosis. He has received targeted chemotherapy for this most recently on Daratumumab. He has a remote history of kidney stone disease undergoing ureteroscopy greater than a decade ago.   Pt seen last night at [facility] for LLQ abdominal pain related to an enlarging inguinal mass thought to be a hernia. Coincidentally he has an evaluation scheduled with [name] next week in regards to this. CT imaging revealed an 8x93m left proximal ureteral calculus 3cm distal to the left UPJ with associated left hydronephrosis and perinephric stranding. Imaging also suggested some layering of bladder calculi in the dependent portion of the bladder. A left fat containing inguinal hernia was identified but did not show incarceration. His kidney function was slightly elevated from his baseline with creatinine of 1.26, UA not suspicious for infection, there was no leukocytosis. Other pertinent CT exam findings correlated to his oncological history and were communicated to the patient with recommendations for follow-up. Radiologist interpretation in regards to this will be dictated below.    Today pain and discomfort less severe than yesterday which caused him to present to the ED. He has not had to take any pain medication since leaving the ED. Since symptoms began he has noted increased urinary frequency but no painful urgency or gross hematuria. Denies nausea/vomiting, fever/chills. Since beginning Rapaflo at last office visit, the patient has noted an improvement with force of stream and  decreased nocturia. He does have chronic dysuria but that has not worsened since kidney stone diagnosis.   The patient's stone was on his left side.   The patient has had flank pain since they were last seen. The patient has developed frequency. He has no seen blood in his urine since the last visit. He is currently having groin pain. He denies having flank pain, back pain, nausea, vomiting, fever, and chills. He has had Ureteroscopy for treatment of his stones in the past.     CC/HPI: CT abdomen/pelvis with and without contrast from 07/13/2018:   IMPRESSION:  1. Acute obstructive uropathy on the left with a bulky 8 x 11 mm  obstructing calculus about 3 cm distal to the left UPJ. Additional  layering small bladder calculi and left greater than right  nephrolithiasis.   2. Chronic but progressed partially calcified lymphadenopathy in the  visible chest (bulky subcarinal nodal conglomeration) and pelvis may  correspond to the history of Amyloidosis. Evidence of some  superimposed chronic interstitial lung disease and other  differential considerations include Sarcoidosis, Silicosis.   3. New pancreatic parenchymal calcifications since 2016 are also  nonspecific. These could be related to #3, but RECOMMEND follow-up  Abdomen MRI without and with contrast (Pancreatic Protocol) to  further characterize.   4. Fat containing left inguinal hernia appears non incarcerated.     ALLERGIES: Penicillins    MEDICATIONS: Androgel 20.25 mg/1.25 gram per actuation (1.62 %) gel in metered-dose pump apply 4 pumps topically every morning  Aspirin 81 mg tablet,chewable  Omeprazole 20 mg capsule,delayed release  Acyclovir 400 mg tablet  Ambien 5 mg tablet Oral  Daratumumab  Silodosin 8 mg capsule 1 capsule PO Daily  Toe Nail Medication     GU PSH: Biopsy Skin Lesion - 2013 Cystoscopy Ureteroscopy - 2008 Remove Kidney Stone - 2008 Simple orchiectomy - 2008      PSH Notes: Rotator Cuff  Repair, Rotator Cuff Repair, Biopsy Lymph Node, Biopsy Skin, Foot Surgery, Knee Surgery, Cystoscopy With Ureteroscopy, Orchiectomy Right, Lithotomy   NON-GU PSH: Biopsy/remove Lymph Nodes - 2015    GU PMH: Elevated PSA - 12/08/2016 BPH w/LUTS, Benign prostatic hyperplasia with urinary obstruction - 2017 ED due to arterial insufficiency, Erectile dysfunction due to arterial insufficiency - 2017 History of urolithiasis, History of kidney stones - 2017 Nocturia, Nocturia - 2017 Primary hypogonadism, Hypogonadism, testicular - 2017 Dysuria, Dysuria - 2015 Chronic prostatitis, Prostatitis, chronic - 2014 Other microscopic hematuria, Microscopic Hematuria - 2014 Personal Hx Oth male genital organs diseases, History of acute prostatitis - 2014 Testicular atrophy, Testicular atrophy - 2014      PMH Notes:  2011-03-22 15:11:02 - Note: Malignant Melanoma Of The Back   NON-GU PMH: Personal history of other specified conditions, History of fever - 2017 Encounter for general adult medical examination without abnormal findings, Encounter for preventive health examination - 2016 Personal history of other malignant neoplasms of lymphoid, hematopoietic and related tissues, History of multiple myeloma - 2016 Organ-limited amyloidosis, Localized amyloidosis - 2015 Personal history of other endocrine, nutritional and metabolic disease, History of hypercholesterolemia - 2014    FAMILY HISTORY: Blood In Urine - Father Death In The Family Father - Father Death In The Family Mother - Mother Family Health Status Number - Runs In Family Malignant Pancreatic Neoplasm - Father nephrolithiasis - Father Parkinson's Disease - Mother Prostate Cancer - Father   SOCIAL HISTORY: Marital Status: Married Preferred Language: English; Race: White Current Smoking Status: Patient does not smoke anymore.   Tobacco Use Assessment Completed: Used Tobacco in last 30 days? Drinks 1 caffeinated drink per day.     Notes:  Former smoker, Occupation:, Caffeine Use, Marital History - Currently Married, Alcohol Use, Tobacco Use   REVIEW OF SYSTEMS:    GU Review Male:   Patient reports frequent urination and get up at night to urinate. Patient denies hard to postpone urination, burning/ pain with urination, leakage of urine, stream starts and stops, trouble starting your stream, have to strain to urinate , erection problems, and penile pain.  Gastrointestinal (Upper):   Patient denies nausea, vomiting, and indigestion/ heartburn.  Gastrointestinal (Lower):   Patient denies diarrhea and constipation.  Constitutional:   Patient denies fever, night sweats, weight loss, and fatigue.  Skin:   Patient denies skin rash/ lesion and itching.  Eyes:   Patient denies blurred vision and double vision.  Ears/ Nose/ Throat:   Patient denies sore throat and sinus problems.  Hematologic/Lymphatic:   Patient denies swollen glands and easy bruising.  Cardiovascular:   Patient denies leg swelling and chest pains.  Respiratory:   Patient denies cough and shortness of breath.  Endocrine:   Patient denies excessive thirst.  Musculoskeletal:   Patient denies back pain and joint pain.  Neurological:   Patient denies headaches and dizziness.  Psychologic:   Patient denies depression and anxiety.   Notes: Reviewed previous review of systems 02/27/2018. No changes.   VITAL SIGNS:      07/13/2018 02:03 PM  Weight 143 lb / 64.86 kg  Height 66 in / 167.64 cm  BP 130/82 mmHg  Pulse 80 /min  Temperature 98.2 F / 36.7  C  BMI 23.1 kg/m   MULTI-SYSTEM PHYSICAL EXAMINATION:    Constitutional: Well-nourished. No physical deformities. Normally developed. Good grooming.  Neck: Neck symmetrical, not swollen. Normal tracheal position.  Respiratory: Normal breath sounds. No labored breathing, no use of accessory muscles.   Cardiovascular: Regular rate and rhythm. No murmur, no gallop. Normal temperature, normal extremity pulses, no swelling, no  varicosities.   Skin: No paleness, no jaundice, no cyanosis. No lesion, no ulcer, no rash.  Neurologic / Psychiatric: Oriented to time, oriented to place, oriented to person. No depression, no anxiety, no agitation.  Gastrointestinal: Left inguinal hernia with valsava, easily reduces. Mild point tenderness over the left inguinal area. No mass, no tenderness, no rigidity, non obese abdomen. No CVA or flank tenderness.  Musculoskeletal: Normal gait and station of head and neck.     PAST DATA REVIEWED:  Source Of History:  Patient, Medical Record Summary  Lab Test Review:   BMP, CBC with Diff  Records Review:   Previous Hospital Records, Previous Patient Records  Urine Test Review:   Urinalysis  X-Ray Review: KUB Additional Views: Reviewed Films. Discussed With Patient.  KUB: Reviewed Films. Discussed With Patient.  C.T. Abdomen/Pelvis: Reviewed Films. Reviewed Report. Discussed With Patient.     02/23/18 03/04/17 12/01/16 09/03/16 07/09/16 10/01/15 09/18/14 03/21/14  PSA  Total PSA 3.09 ng/mL 2.83 ng/mL 3.30 ng/mL 2.66 ng/mL 3.89 ng/dl 2.00 ng/dl 2.65  3.14     02/23/18 08/31/17 03/04/17 05/27/16 03/24/16 10/01/15 04/04/15 09/19/14  Hormones  Testosterone, Total 640.8 ng/dL 809.2 ng/dL 730.0 ng/dL 375.2 pg/dL 609.2 pg/dL 488.4 pg/dL 709  504     PROCEDURES:          KUB Additional Views - 74019 AP and LPO Abdominal Views were obtained. Ureteral calculus poorly visualized on AP view. On LPO view there is a correlating opacity overlying the lateral aspect of the superior endplate of L4 vertebrae that grossly correlates with the ureteral stone seen on recent CT imaging. Xray reviewed with patient's urologist. An arrow was drawn in PACS for better identification.  Patient confirmed No Neulasta OnPro Device.           Urinalysis w/Scope Dipstick Dipstick Cont'd Micro  Color: Yellow Bilirubin: Neg mg/dL WBC/hpf: NS (Not Seen)  Appearance: Clear Ketones: Neg mg/dL RBC/hpf: 3 - 10/hpf   Specific Gravity: 1.025 Blood: 3+ ery/uL Bacteria: NS (Not Seen)  pH: <=5.0 Protein: Trace mg/dL Cystals: NS (Not Seen)  Glucose: Neg mg/dL Urobilinogen: 0.2 mg/dL Casts: NS (Not Seen)    Nitrites: Neg Trichomonas: Not Present    Leukocyte Esterase: Neg leu/uL Mucous: Not Present      Epithelial Cells: NS (Not Seen)      Yeast: NS (Not Seen)      Sperm: Not Present    ASSESSMENT:      ICD-10 Details  1 GU:   Ureteral calculus - N20.1 Left   PLAN:           Orders Labs Urine Culture  X-Rays: KUB          Schedule Return Visit/Planned Activity: 1 Week - Schedule Surgery          Document Letter(s):  Created for Patient: Clinical Summary         Notes:   I reviewed his imaging studies with Dr Jeffie Pollock. On the oblique view there appears to be an opacity consistent with the one seen on CT imaging overling the superior end plate of L4. An arrow was drawn for  further guidance. The patient's symptoms are well controlled at this time. He has pain medication at home if needed and will continue generic Rapaflo. Based on my review with his urologist and discussion with patient, it is likely the stone will not pass on its own without some type of definitive intervention. I discussed shockwave lithotripsy and ureteroscopy with patient with ESWL being our initial recommendation especially if stone remains visible on imaging studies prior to the procedure.  We did discuss the complications of shockwave lithotripsy including need for additional procedures, hematoma, obstructing fragments, and ongoing pain. I have reviewed the risks of ureteroscopy with the patient including bleeding, infection, ureteral injury, need for a stent or secondary procedures, thrombotic events and anesthetic complications.   I have ordered additional oblique views to be performed at time of his procedure to better guide the performing urologist. The patient understands if stone is not visualized, the procedure will be  postponed and he will likely need ureteroscopy for definitive management.  Follow-up instructions given regarding any worsening symptoms including inability to urinate, uncontrollable pain, uncontrollable n/v, or fever. UA sent for c/s. I see no reason for him to delay f/u with [facility] next week for his inguinal hernia. I also recommunicated CT exam findings in relation to his known hx of cancer and recommended close f/u with his oncologist regarding this with the patient expressing understanding.        Next Appointment:      Next Appointment: 08/21/2018 08:30 AM    Appointment Type: Laboratory Appointment    Location: [facility] [phone]    Provider: Lab LAB    Reason for Visit: H\T\H-total test-Ryu Cerreta

## 2018-07-17 NOTE — Discharge Instructions (Signed)

## 2018-07-17 NOTE — Interval H&P Note (Signed)
History and Physical Interval Note:  07/17/2018 3:28 PM  William Barron  has presented today for surgery, with the diagnosis of LEFT URETERAL STONE.  The various methods of treatment have been discussed with the patient and family. After consideration of risks, benefits and other options for treatment, the patient has consented to  Procedure(s): EXTRACORPOREAL SHOCK WAVE LITHOTRIPSY (ESWL) (Left) as a surgical intervention.  The patient's history has been reviewed, patient examined, no change in status, stable for surgery.  I have reviewed the patient's chart and labs.  Questions were answered to the patient's satisfaction.     [name]

## 2018-07-18 ENCOUNTER — Ambulatory Visit: Payer: Medicare Other | Admitting: Neurology

## 2018-07-18 ENCOUNTER — Encounter (HOSPITAL_COMMUNITY): Payer: Self-pay | Admitting: Urology

## 2018-07-18 NOTE — Telephone Encounter (Signed)
Pt called in and stated he will need to cancel his appt

## 2018-07-18 NOTE — Telephone Encounter (Signed)
Followed up with William Barron--pt called to cx. He did not want to r/s at this time.  I reviewed chart, appears he has emergency surgery yesterday. I notified Dr. Felecia Shelling

## 2018-07-19 ENCOUNTER — Ambulatory Visit: Payer: Self-pay

## 2018-07-19 ENCOUNTER — Ambulatory Visit: Payer: Self-pay | Admitting: Hematology & Oncology

## 2018-07-19 ENCOUNTER — Other Ambulatory Visit: Payer: Self-pay

## 2018-07-20 DIAGNOSIS — H9202 Otalgia, left ear: Secondary | ICD-10-CM | POA: Diagnosis not present

## 2018-07-20 DIAGNOSIS — T162XXA Foreign body in left ear, initial encounter: Secondary | ICD-10-CM | POA: Diagnosis not present

## 2018-07-20 DIAGNOSIS — H9192 Unspecified hearing loss, left ear: Secondary | ICD-10-CM | POA: Diagnosis not present

## 2018-07-20 DIAGNOSIS — H60502 Unspecified acute noninfective otitis externa, left ear: Secondary | ICD-10-CM | POA: Diagnosis not present

## 2018-07-20 DIAGNOSIS — K4090 Unilateral inguinal hernia, without obstruction or gangrene, not specified as recurrent: Secondary | ICD-10-CM | POA: Diagnosis not present

## 2018-07-24 DIAGNOSIS — Z0000 Encounter for general adult medical examination without abnormal findings: Secondary | ICD-10-CM | POA: Diagnosis not present

## 2018-07-24 DIAGNOSIS — E291 Testicular hypofunction: Secondary | ICD-10-CM | POA: Diagnosis not present

## 2018-07-24 DIAGNOSIS — R7989 Other specified abnormal findings of blood chemistry: Secondary | ICD-10-CM | POA: Diagnosis not present

## 2018-07-24 DIAGNOSIS — E7849 Other hyperlipidemia: Secondary | ICD-10-CM | POA: Diagnosis not present

## 2018-07-24 DIAGNOSIS — R82998 Other abnormal findings in urine: Secondary | ICD-10-CM | POA: Diagnosis not present

## 2018-07-24 DIAGNOSIS — Z125 Encounter for screening for malignant neoplasm of prostate: Secondary | ICD-10-CM | POA: Diagnosis not present

## 2018-07-24 DIAGNOSIS — N401 Enlarged prostate with lower urinary tract symptoms: Secondary | ICD-10-CM | POA: Diagnosis not present

## 2018-07-25 NOTE — Progress Notes (Deleted)
{Choose 1 Note Type (Telehealth Visit or Telephone Visit):[phone]}   Date:  07/25/2018   ID:  [name], DOB 23-Nov-1945, MRN [id number]  {Patient Location:[phone]::"Home"} {Provider Location:[phone]::"Home"}  PCP:  [name], MD  Cardiologist:  No primary care provider on file. *** Electrophysiologist:  None   Evaluation Performed:  {Choose Visit Type:[id number]::"Follow-Up Visit"}  Chief Complaint:  ***  History of Present Illness:    William Barron is a 73 y.o. male who presents for evaluation of coronary calcium.  He had an abnormal POET (Plain Old Exercise Treadmill) and significant calcium in his coronaries.  However, cath demonstrated mild CAD. He had a follow up echo.   He had some moderate PR but this was unchanged from previous.  He has no pulmonary HTN and normal RV/LV function.   He returns for follow up. ***    He has had no new symptoms.  However, his cholesterol was up recently and his primary physician brought him back to discuss this.  He has not been exercising routinely routinely.  With his activity he denies cardiovascular symptoms.  The patient denies any new symptoms such as chest discomfort, neck or arm discomfort. There has been no new shortness of breath, PND or orthopnea. There have been no reported palpitations, presyncope or syncope.   The patient {does/does not:200015} have symptoms concerning for COVID-19 infection (fever, chills, cough, or new shortness of breath).    Past Medical History:  Diagnosis Date  . Amyloidosis ([facility])   . GERD (gastroesophageal reflux disease)   . Hearing loss    Has hearing aids  . History of kidney stones   . Hyperlipidemia   . Multiple myeloma ([facility])    and amylodosis   . Nephrolithiasis   . Prostatitis    acute and chronic   Past Surgical History:  Procedure Laterality Date  . APPENDECTOMY    . CARDIAC CATHETERIZATION N/A 06/13/2015   Procedure: Left Heart Cath and Coronary Angiography;   Surgeon: Jettie Booze, MD;  Location: [facility];  Service: Cardiovascular;  Laterality: N/A;  . COLONOSCOPY  04/07/00  . ELBOW SURGERY  2003   right  . EXTRACORPOREAL SHOCK WAVE LITHOTRIPSY Left 07/17/2018   Procedure: EXTRACORPOREAL SHOCK WAVE LITHOTRIPSY (ESWL);  Surgeon: [name], MD;  Location: [facility];  Service: Urology;  Laterality: Left;  . HERNIA REPAIR    . ROTATOR CUFF REPAIR       No outpatient medications have been marked as taking for the 07/27/18 encounter (Appointment) with Minus Breeding, MD.     Allergies:   Penicillins   Social History   Tobacco Use  . Smoking status: Former Smoker    Last attempt to quit: 03/15/1977    Years since quitting: 41.3  . Smokeless tobacco: Never Used  . Tobacco comment: quit 35 years ago  Substance Use Topics  . Alcohol use: Yes    Alcohol/week: 0.0 standard drinks    Comment: approx 1 drink/night  . Drug use: No     Family Hx: The patient's family history includes CAD (age of onset: 60) in his brother; Pancreatic cancer in his father; Parkinsonism in his mother.  ROS:   Please see the history of present illness.    *** All other systems reviewed and are negative.   Prior CV studies:   The following studies were reviewed today:  ***  Labs/Other Tests and Data Reviewed:    EKG:  {EKG/Telemetry Strips Reviewed:[phone]}  Recent Labs: 06/21/2018: ALT 13 07/13/2018:  BUN 34; Creatinine, Ser 1.26; Hemoglobin 16.8; Platelets 312; Potassium 3.4; Sodium 138   Recent Lipid Panel Lab Results  Component Value Date/Time   CHOL 189 03/29/2011 02:48 PM   TRIG 87.0 03/29/2011 02:48 PM   HDL 45.30 03/29/2011 02:48 PM   CHOLHDL 4 03/29/2011 02:48 PM   LDLCALC 126 (H) 03/29/2011 02:48 PM    Wt Readings from Last 3 Encounters:  07/17/18 143 lb 4.8 oz (65 kg)  07/15/18 143 lb 4.8 oz (65 kg)  07/12/18 145 lb (65.8 kg)     Objective:    Vital Signs:  There were no vitals taken for this visit.   {[facility]  Virtual Exam (Optional):[phone]::"VITAL SIGNS:  reviewed"}  ASSESSMENT & PLAN:    ELEVATED CORONARY CALCIUM:  ***   He has no new symptoms.  He had a nonobstructive disease on cath a couple of years ago.  At this point no further cardiovascular testing is suggested.  He should continue with risk reduction and this is discussed below.  PULMONIC REGURGITATION:    ***  I will follow up with an echocardiogram.   REDUCED EF:  ***  This will be evaluated as above.  DYSLIPIDEMIA:  ***  His LDL was 122 and HDL 36.  This technically puts his MESA score at 59 which is however, not absolutely applicable with his age.  I do think it is convincing data to have him start an aspirin.   We talked about a statin but he would rather hold off on this.  He will try diet and exercise and then repeat a lipid profile.   COVID-19 Education: The signs and symptoms of COVID-19 were discussed with the patient and how to seek care for testing (follow up with PCP or arrange E-visit).  ***The importance of social distancing was discussed today.  Time:   Today, I have spent *** minutes with the patient with telehealth technology discussing the above problems.     Medication Adjustments/Labs and Tests Ordered: Current medicines are reviewed at length with the patient today.  Concerns regarding medicines are outlined above.   Tests Ordered: No orders of the defined types were placed in this encounter.   Medication Changes: No orders of the defined types were placed in this encounter.   Disposition:  Follow up {follow up:15908}  Signed, Minus Breeding, MD  07/25/2018 9:28 PM    [facility]

## 2018-07-26 ENCOUNTER — Telehealth: Payer: Self-pay | Admitting: Cardiology

## 2018-07-26 NOTE — Telephone Encounter (Signed)
Patient wants to cancel his virtual for tomorrow and reschedule for an office visit at a later date.Marland Kitchen admin staff was informed.

## 2018-07-27 ENCOUNTER — Telehealth: Payer: Medicare Other | Admitting: Cardiology

## 2018-08-01 ENCOUNTER — Ambulatory Visit: Payer: Medicare Other | Admitting: Cardiology

## 2018-08-01 DIAGNOSIS — N201 Calculus of ureter: Secondary | ICD-10-CM | POA: Diagnosis not present

## 2018-08-01 DIAGNOSIS — N401 Enlarged prostate with lower urinary tract symptoms: Secondary | ICD-10-CM | POA: Diagnosis not present

## 2018-08-01 DIAGNOSIS — N132 Hydronephrosis with renal and ureteral calculous obstruction: Secondary | ICD-10-CM | POA: Diagnosis not present

## 2018-08-02 DIAGNOSIS — Z5112 Encounter for antineoplastic immunotherapy: Secondary | ICD-10-CM | POA: Diagnosis not present

## 2018-08-02 DIAGNOSIS — K8689 Other specified diseases of pancreas: Secondary | ICD-10-CM | POA: Diagnosis not present

## 2018-08-02 DIAGNOSIS — Z87891 Personal history of nicotine dependence: Secondary | ICD-10-CM | POA: Diagnosis not present

## 2018-08-02 DIAGNOSIS — C9000 Multiple myeloma not having achieved remission: Secondary | ICD-10-CM | POA: Diagnosis not present

## 2018-08-02 DIAGNOSIS — H532 Diplopia: Secondary | ICD-10-CM | POA: Diagnosis not present

## 2018-08-02 DIAGNOSIS — Z79899 Other long term (current) drug therapy: Secondary | ICD-10-CM | POA: Diagnosis not present

## 2018-08-02 DIAGNOSIS — E559 Vitamin D deficiency, unspecified: Secondary | ICD-10-CM | POA: Diagnosis not present

## 2018-08-02 DIAGNOSIS — E8581 Light chain (AL) amyloidosis: Secondary | ICD-10-CM | POA: Diagnosis not present

## 2018-08-02 DIAGNOSIS — Z87442 Personal history of urinary calculi: Secondary | ICD-10-CM | POA: Diagnosis not present

## 2018-08-02 DIAGNOSIS — G250 Essential tremor: Secondary | ICD-10-CM | POA: Diagnosis not present

## 2018-08-02 DIAGNOSIS — Z7982 Long term (current) use of aspirin: Secondary | ICD-10-CM | POA: Diagnosis not present

## 2018-08-02 DIAGNOSIS — R590 Localized enlarged lymph nodes: Secondary | ICD-10-CM | POA: Diagnosis not present

## 2018-08-02 DIAGNOSIS — I2510 Atherosclerotic heart disease of native coronary artery without angina pectoris: Secondary | ICD-10-CM | POA: Diagnosis not present

## 2018-08-09 ENCOUNTER — Other Ambulatory Visit: Payer: Self-pay

## 2018-08-09 ENCOUNTER — Ambulatory Visit: Payer: Self-pay

## 2018-08-21 DIAGNOSIS — E291 Testicular hypofunction: Secondary | ICD-10-CM | POA: Diagnosis not present

## 2018-08-28 DIAGNOSIS — E291 Testicular hypofunction: Secondary | ICD-10-CM | POA: Diagnosis not present

## 2018-08-28 DIAGNOSIS — Z87442 Personal history of urinary calculi: Secondary | ICD-10-CM | POA: Diagnosis not present

## 2018-08-28 DIAGNOSIS — R972 Elevated prostate specific antigen [PSA]: Secondary | ICD-10-CM | POA: Diagnosis not present

## 2018-08-28 DIAGNOSIS — N403 Nodular prostate with lower urinary tract symptoms: Secondary | ICD-10-CM | POA: Diagnosis not present

## 2018-08-28 DIAGNOSIS — R351 Nocturia: Secondary | ICD-10-CM | POA: Diagnosis not present

## 2018-08-29 DIAGNOSIS — C9000 Multiple myeloma not having achieved remission: Secondary | ICD-10-CM | POA: Diagnosis not present

## 2018-08-29 DIAGNOSIS — E8581 Light chain (AL) amyloidosis: Secondary | ICD-10-CM | POA: Diagnosis not present

## 2018-08-30 DIAGNOSIS — Z79899 Other long term (current) drug therapy: Secondary | ICD-10-CM | POA: Diagnosis not present

## 2018-08-30 DIAGNOSIS — K4090 Unilateral inguinal hernia, without obstruction or gangrene, not specified as recurrent: Secondary | ICD-10-CM | POA: Diagnosis not present

## 2018-08-30 DIAGNOSIS — E559 Vitamin D deficiency, unspecified: Secondary | ICD-10-CM | POA: Diagnosis not present

## 2018-08-30 DIAGNOSIS — Z87891 Personal history of nicotine dependence: Secondary | ICD-10-CM | POA: Diagnosis not present

## 2018-08-30 DIAGNOSIS — I2510 Atherosclerotic heart disease of native coronary artery without angina pectoris: Secondary | ICD-10-CM | POA: Diagnosis not present

## 2018-08-30 DIAGNOSIS — E8581 Light chain (AL) amyloidosis: Secondary | ICD-10-CM | POA: Diagnosis not present

## 2018-08-30 DIAGNOSIS — Z5112 Encounter for antineoplastic immunotherapy: Secondary | ICD-10-CM | POA: Diagnosis not present

## 2018-08-30 DIAGNOSIS — R590 Localized enlarged lymph nodes: Secondary | ICD-10-CM | POA: Diagnosis not present

## 2018-08-30 DIAGNOSIS — R972 Elevated prostate specific antigen [PSA]: Secondary | ICD-10-CM | POA: Diagnosis not present

## 2018-08-30 DIAGNOSIS — C9000 Multiple myeloma not having achieved remission: Secondary | ICD-10-CM | POA: Diagnosis not present

## 2018-09-27 DIAGNOSIS — E8581 Light chain (AL) amyloidosis: Secondary | ICD-10-CM | POA: Diagnosis not present

## 2018-09-27 DIAGNOSIS — C9000 Multiple myeloma not having achieved remission: Secondary | ICD-10-CM | POA: Diagnosis not present

## 2018-09-27 DIAGNOSIS — K8689 Other specified diseases of pancreas: Secondary | ICD-10-CM | POA: Diagnosis not present

## 2018-09-27 DIAGNOSIS — Z87891 Personal history of nicotine dependence: Secondary | ICD-10-CM | POA: Diagnosis not present

## 2018-09-27 DIAGNOSIS — I2510 Atherosclerotic heart disease of native coronary artery without angina pectoris: Secondary | ICD-10-CM | POA: Diagnosis not present

## 2018-09-27 DIAGNOSIS — Z5112 Encounter for antineoplastic immunotherapy: Secondary | ICD-10-CM | POA: Diagnosis not present

## 2018-09-27 DIAGNOSIS — E559 Vitamin D deficiency, unspecified: Secondary | ICD-10-CM | POA: Diagnosis not present

## 2018-09-27 DIAGNOSIS — R5383 Other fatigue: Secondary | ICD-10-CM | POA: Diagnosis not present

## 2018-09-27 DIAGNOSIS — G250 Essential tremor: Secondary | ICD-10-CM | POA: Diagnosis not present

## 2018-09-27 DIAGNOSIS — H532 Diplopia: Secondary | ICD-10-CM | POA: Diagnosis not present

## 2018-09-27 DIAGNOSIS — N200 Calculus of kidney: Secondary | ICD-10-CM | POA: Diagnosis not present

## 2018-09-28 DIAGNOSIS — R972 Elevated prostate specific antigen [PSA]: Secondary | ICD-10-CM | POA: Diagnosis not present

## 2018-10-06 DIAGNOSIS — N200 Calculus of kidney: Secondary | ICD-10-CM | POA: Diagnosis not present

## 2018-10-16 DIAGNOSIS — M722 Plantar fascial fibromatosis: Secondary | ICD-10-CM | POA: Diagnosis not present

## 2018-10-16 DIAGNOSIS — L600 Ingrowing nail: Secondary | ICD-10-CM | POA: Diagnosis not present

## 2018-10-25 DIAGNOSIS — I2510 Atherosclerotic heart disease of native coronary artery without angina pectoris: Secondary | ICD-10-CM | POA: Diagnosis not present

## 2018-10-25 DIAGNOSIS — E8581 Light chain (AL) amyloidosis: Secondary | ICD-10-CM | POA: Diagnosis not present

## 2018-10-25 DIAGNOSIS — G250 Essential tremor: Secondary | ICD-10-CM | POA: Diagnosis not present

## 2018-10-25 DIAGNOSIS — Z7982 Long term (current) use of aspirin: Secondary | ICD-10-CM | POA: Diagnosis not present

## 2018-10-25 DIAGNOSIS — N200 Calculus of kidney: Secondary | ICD-10-CM | POA: Diagnosis not present

## 2018-10-25 DIAGNOSIS — Z87891 Personal history of nicotine dependence: Secondary | ICD-10-CM | POA: Diagnosis not present

## 2018-10-25 DIAGNOSIS — R972 Elevated prostate specific antigen [PSA]: Secondary | ICD-10-CM | POA: Diagnosis not present

## 2018-10-25 DIAGNOSIS — R5383 Other fatigue: Secondary | ICD-10-CM | POA: Diagnosis not present

## 2018-10-25 DIAGNOSIS — C9000 Multiple myeloma not having achieved remission: Secondary | ICD-10-CM | POA: Diagnosis not present

## 2018-10-25 DIAGNOSIS — E559 Vitamin D deficiency, unspecified: Secondary | ICD-10-CM | POA: Diagnosis not present

## 2018-10-25 DIAGNOSIS — H532 Diplopia: Secondary | ICD-10-CM | POA: Diagnosis not present

## 2018-10-25 DIAGNOSIS — Z5112 Encounter for antineoplastic immunotherapy: Secondary | ICD-10-CM | POA: Diagnosis not present

## 2018-10-26 NOTE — Progress Notes (Signed)
Cardiology Office Note   Date:  10/27/2018   ID:  William Barron, DOB 06-Oct-1945, MRN [id number]  PCP:  William Hatchet, MD  Cardiologist:   William Breeding, MD   Chief Complaint  Patient presents with  . Elevated Coronary Calcium      History of Present Illness: William Barron is a 73 y.o. male who presents for evaluation of coronary calcium.  He had an abnormal POET (Plain Old Exercise Treadmill) and significant calcium in his coronaries.  However, cath demonstrated mild CAD. He had a follow up echo last year.   He had some moderate PR but this was unchanged from previous.  He has no pulmonary HTN and normal RV/LV function.   He returns for follow up.   Since I last saw him he has been actively managed for multiple myeloma from [facility].  He walks his dog William Barron and does some other activities.  He denies any cardiovascular symptoms.  In particular he denies any shortness of breath, PND or orthopnea.  He said no palpitations, presyncope or syncope.  He has had no edema.  Past Medical History:  Diagnosis Date  . Amyloidosis ([place])   . GERD (gastroesophageal reflux disease)   . Hearing loss    Has hearing aids  . History of kidney stones   . Hyperlipidemia   . Multiple myeloma (HCC)    and amylodosis   . Nephrolithiasis   . Prostatitis    acute and chronic    Past Surgical History:  Procedure Laterality Date  . APPENDECTOMY    . CARDIAC CATHETERIZATION N/A 06/13/2015   Procedure: Left Heart Cath and Coronary Angiography;  Surgeon: William Booze, MD;  Location: [place] [facility];  Service: Cardiovascular;  Laterality: N/A;  . COLONOSCOPY  04/07/00  . ELBOW SURGERY  2003   right  . EXTRACORPOREAL SHOCK WAVE LITHOTRIPSY Left 07/17/2018   Procedure: EXTRACORPOREAL SHOCK WAVE LITHOTRIPSY (ESWL);  Surgeon: William Seal, MD;  [facility];  Service: Urology;  Laterality: Left;  . HERNIA REPAIR    . ROTATOR CUFF REPAIR       Current Outpatient Medications   Medication Sig Dispense Refill  . ANDROGEL PUMP 20.25 MG/ACT (1.62%) GEL Apply 3 application topically every morning.     Marland Kitchen dexamethasone (DECADRON) 4 MG tablet TAKE 2.5 TABLETS (10 MG TOTAL) BY MOUTH ONCE A WEEK FOR 120 DAYS    . fluconazole (DIFLUCAN) 200 MG tablet Take 200 mg by mouth daily. Takes once per week on  Sundays    . silodosin (RAPAFLO) 8 MG CAPS capsule Take 8 mg by mouth daily.    Marland Kitchen zolpidem (AMBIEN) 10 MG tablet Take 10 mg by mouth at bedtime. Patient takes prn    . acyclovir (ZOVIRAX) 400 MG tablet Take 400 mg by mouth 2 (two) times daily. Patient takes prn    . HYDROmorphone (DILAUDID) 2 MG tablet Take 1 tablet (2 mg total) by mouth every 4 (four) hours as needed for severe pain (may cause constipation; do not take if taking oxycodone). (Patient not taking: Reported on 10/27/2018) 12 tablet 0  . omeprazole (PRILOSEC) 20 MG capsule Take 20 mg by mouth daily. Patient takes prn    . ondansetron (ZOFRAN ODT) 8 MG disintegrating tablet Take 1 tablet (8 mg total) by mouth every 8 (eight) hours as needed. 20 tablet 0  . oxyCODONE-acetaminophen (PERCOCET) 10-325 MG tablet Take 1 tablet by mouth every 4 (four) hours as needed for pain ((may cause  constipation; may take half a tablet if a full tablet is too strong)). (Patient not taking: Reported on 10/27/2018) 30 tablet 0   No current facility-administered medications for this visit.     Allergies:   Penicillins    ROS:  Please see the history of present illness.   Otherwise, review of systems are positive for none.   All other systems are reviewed and negative.    PHYSICAL EXAM: VS:  BP 132/68   Pulse 68   Temp 97.9 F (36.6 C)   Ht '5\' 6"'  (1.676 m)   Wt 146 lb (66.2 kg)   SpO2 98%   BMI 23.57 kg/m  , BMI Body mass index is 23.57 kg/m.  GENERAL:  Well appearing NECK:  No jugular venous distention, waveform within normal limits, carotid upstroke brisk and symmetric, no bruits, no thyromegaly LUNGS:  Clear to auscultation  bilaterally HEART:  PMI not displaced or sustained,S1 and S2 within normal limits, no S3, no S4, no clicks, no rubs, soft brief apical systolic murmur nonradiating, no diastolic murmurs ABD:  Flat, positive bowel sounds normal in frequency in pitch, no bruits, no rebound, no guarding, no midline pulsatile mass, no hepatomegaly, no splenomegaly EXT:  2 plus pulses throughout, no edema, no cyanosis no clubbing   EKG:  EKG is  ordered today. Sinus rhythm, rate 64, axis within normal limits, intervals within normal limits, no acute ST-T wave changes.    Recent Labs: 06/21/2018: ALT 13 07/13/2018: BUN 34; Creatinine, Ser 1.26; Hemoglobin 16.8; Platelets 312; Potassium 3.4; Sodium 138      Wt Readings from Last 3 Encounters:  10/27/18 146 lb (66.2 kg)  07/17/18 143 lb 4.8 oz (65 kg)  07/15/18 143 lb 4.8 oz (65 kg)      Other studies Reviewed: Additional studies/ records that were reviewed today include:   Labs Review of the above records demonstrates:   See below   ASSESSMENT AND PLAN:  ELEVATED CORONARY CALCIUM:     He has no new symptoms.  He is active.  He had nonobstructive disease despite a very elevated calcium score.  No change in therapy.  We will continue with risk reduction.   PULMONIC REGURGITATION:    This was not evident on the echo last year.  His exam is unremarkable.  No further imaging.  REDUCED EF:   He had a normal EF on echo last year.  No further imaging.   DYSLIPIDEMIA:  His LDL was 118.  He would qualify based on his Framingham or MESA score for statin but he does not want to take this and we have previously talked about this.  We talked about it again and he would like to defer.   Current medicines are reviewed at length with the patient today.  The patient does not have concerns regarding medicines.  The following changes have been made:  None   Labs/ tests ordered today include:   None  Orders Placed This Encounter  Procedures  . EKG 12-Lead      Disposition:   FU with me in one year.     Signed, William Breeding, MD  10/27/2018 11:41 AM    [facility]

## 2018-10-27 ENCOUNTER — Ambulatory Visit (INDEPENDENT_AMBULATORY_CARE_PROVIDER_SITE_OTHER): Payer: Medicare Other | Admitting: Cardiology

## 2018-10-27 ENCOUNTER — Other Ambulatory Visit: Payer: Self-pay

## 2018-10-27 ENCOUNTER — Encounter: Payer: Self-pay | Admitting: Cardiology

## 2018-10-27 VITALS — BP 132/68 | HR 68 | Temp 97.9°F | Ht 66.0 in | Wt 146.0 lb

## 2018-10-27 DIAGNOSIS — I272 Pulmonary hypertension, unspecified: Secondary | ICD-10-CM | POA: Diagnosis not present

## 2018-10-27 DIAGNOSIS — R931 Abnormal findings on diagnostic imaging of heart and coronary circulation: Secondary | ICD-10-CM | POA: Diagnosis not present

## 2018-10-27 DIAGNOSIS — E7800 Pure hypercholesterolemia, unspecified: Secondary | ICD-10-CM

## 2018-10-27 NOTE — Patient Instructions (Addendum)

## 2018-11-01 ENCOUNTER — Other Ambulatory Visit: Payer: Self-pay

## 2018-11-01 ENCOUNTER — Ambulatory Visit (INDEPENDENT_AMBULATORY_CARE_PROVIDER_SITE_OTHER): Payer: Medicare Other | Admitting: Neurology

## 2018-11-01 ENCOUNTER — Encounter: Payer: Self-pay | Admitting: Neurology

## 2018-11-01 VITALS — BP 129/70 | HR 72 | Temp 96.4°F | Wt 152.0 lb

## 2018-11-01 DIAGNOSIS — G4700 Insomnia, unspecified: Secondary | ICD-10-CM | POA: Diagnosis not present

## 2018-11-01 DIAGNOSIS — R413 Other amnesia: Secondary | ICD-10-CM | POA: Diagnosis not present

## 2018-11-01 DIAGNOSIS — G250 Essential tremor: Secondary | ICD-10-CM

## 2018-11-01 DIAGNOSIS — R931 Abnormal findings on diagnostic imaging of heart and coronary circulation: Secondary | ICD-10-CM | POA: Diagnosis not present

## 2018-11-01 DIAGNOSIS — C9001 Multiple myeloma in remission: Secondary | ICD-10-CM

## 2018-11-01 MED ORDER — PROPRANOLOL HCL ER 60 MG PO CP24: 60.0000 mg | ORAL_CAPSULE | Freq: Every day | ORAL | 11 refills | Status: DC

## 2018-11-01 NOTE — Progress Notes (Signed)
[facility]  PATIENT: William Barron DOB: 06-02-1945  REFERRING DOCTOR OR PCP:  [name] SOURCE: patient, records in EMR and from [place], MRI report and lab results  _________________________________   HISTORICAL  CHIEF COMPLAINT:  Chief Complaint  Patient presents with  . Follow-up    Memory follow up room 13     HISTORY OF PRESENT ILLNESS:  William Barron is a 73 y.o. man with mild cognitive impairment.    Update 11/01/2018:  He feels his memory issues are stable.   He has had no difficulties remembering to do things around the house.  Driving is fine.  Mood is doing well.  He is sleeping well at night.  He is noting more issues with the hand tremor.   He notes the tremor is worse if he is upset.  He had some diplopia and got prism glasses which have helped.  Montreal Cognitive Assessment  11/01/2018 01/03/2018 01/03/2018 11/04/2015 09/26/2014  Visuospatial/ Executive (0/5) '4 5 5 5 5  ' Naming (0/3) '3 3 3 3 2  ' Attention: Read list of digits (0/2) '2 2 2 2 1  ' Attention: Read list of letters (0/1) '1 1 1 1 1  ' Attention: Serial 7 subtraction starting at 100 (0/3) '3 3 3 3 3  ' Language: Repeat phrase (0/2) '1 1 1 1 2  ' Language : Fluency (0/1) '1 1 1 1 1  ' Abstraction (0/2) '2 2 2 2 2  ' Delayed Recall (0/5) '5 5 5 2 3  ' Orientation (0/6) '6 6 6 6 6  ' Total '28 29 29 26 26  ' Adjusted Score (based on education) - - '29 26 26     ' Update 01/03/2018: Since the last visit, he has noted more difficulties with tremors in the arms/hands and he is walking slower and more stooped.    His tremor is worse with intention and also when he stands up.    The right leg will sometimes shake when he stnds up straight.     He has not had difficulty with falls/stumbles.    He uses the rail to use stairs but has done so for at least a few years.      He  plays golf but notes putting is worse.     His mother had Prakinson's disease  He feels memory is doing better.   He is recalling  better and scored 29/30 on the MoCA today.   He notes diplopa upon right gaze.  This bothers him more at night.     He will be getting prism glasses.      Montreal Cognitive Assessment  01/03/2018 01/03/2018 11/04/2015 09/26/2014  Visuospatial/ Executive (0/5) '5 5 5 5  ' Naming (0/3) '3 3 3 2  ' Attention: Read list of digits (0/2) '2 2 2 1  ' Attention: Read list of letters (0/1) '1 1 1 1  ' Attention: Serial 7 subtraction starting at 100 (0/3) '3 3 3 3  ' Language: Repeat phrase (0/2) '1 1 1 2  ' Language : Fluency (0/1) '1 1 1 1  ' Abstraction (0/2) '2 2 2 2  ' Delayed Recall (0/5) '5 5 2 3  ' Orientation (0/6) '6 6 6 6  ' Total '29 29 26 26  ' Adjusted Score (based on education) - '29 26 26    ' Update 01/03/2017:   He feels he has been stable since the last visit. 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  He notes some memory lapses but no worse than the last visit.  He is not having any difficulty driving and is not forgetting to do things around the house. He feels his life is less stressful and that has helped the anxiety. He is no longer on BuSpar.. He does have a trial where he is a witness coming up soon.    He is exercising 4-5 days a week .    Mild cognitive impairment was shown on the formal neurocognitive testing. Imaging studies showed chronic microvascular ischemic change  His multiple myeloma appears in remission (follow[facility] at [facility]).   He has no new health issues.     ___________________________________________ From 05/05/2016:   He feels he is doing a little worse.   He retired from Sports coach.   We discussed trying to find a hobby or activity to keep him more active.  They recently bought a beach house and remodeling is keeping him active.    Memory:   He notes doing mildly worse.    He plays cars and makes more errors.    He has trouble with 2 step instructions.  In 2012, his wife started to notice mild cognitive issues.   She noted issues such as not knowing how to get from point A to point B. He had  chemotherapy for MM 2015 to 2016 and he felt he did worse with memory and related issues.     Organization skills are worse and he is more forgetful.  He noted more difficulty retaining what he reads and decreased attention at times.  He is an Forensic psychologist.    I saw him July 2016 and felt he had mild cognitive impairment.    Since then he has seen Dr. Valentina Shaggy who confirmed MCI.     He takes 2000 U otc  Multiple Myeloma: He follows up regularly with hematology. He is stable.   He has IgA kappa multiple myeloma and amyloidosis in late 2014 (diagnosed with amyloidosis first, then after BM with MM). He has seen Dr.Ennover here in [place] and has been referred to [facility] and [facility] for opinions, as well. He was treated with weekly Velcade and oral Cytoxan and Decadron.   He is currently not on medication  Mood:   He denies depression and notes less stress than less visit and no anxiety.Marland Kitchen  His wife feels he is more depressed.     Sleep:  Sleep is worse off Ambien.   With it, he gets a better night sleep but still wakes up 2 x nightly for nocturia.Marland Kitchen   He does not snore or have sleep dysregulated breathing.    He sleeps well.      Other:  He denies diabetes or thyroid disease.     He smoked about 2 ppd for 10 years many years ago.     The MRI report from 09/04/2014 showed nonspecific white matter findings, likely due to chronic small vessel ischemic changes. The actual images were not available for review.   Montreal Cognitive Assessment  01/03/2018 01/03/2018 11/04/2015 09/26/2014  Visuospatial/ Executive (0/5) '5 5 5 5  ' Naming (0/3) '3 3 3 2  ' Attention: Read list of digits (0/2) '2 2 2 1  ' Attention: Read list of letters (0/1) '1 1 1 1  ' Attention: Serial 7 subtraction starting at 100 (0/3) '3 3 3 3  ' Language: Repeat phrase (0/2) '1 1 1 2  ' Language : Fluency (0/1) '1 1 1 1  ' Abstraction (0/2) '2 2 2 2  ' Delayed Recall (0/5) '5 5 2 3  ' Orientation (0/6) 6  '6 6 6  ' Total '29 29 26 26  ' Adjusted Score (based on  education) - '29 26 26      ' REVIEW OF SYSTEMS: Constitutional: No fevers, chills, sweats, or change in appetite Eyes: No visual changes, double vision, eye pain Ear, nose and throat: Notes  hearing loss.  No ear pain, nasal congestion, sore throat Cardiovascular: No chest pain, palpitations Respiratory: No shortness of breath at rest or with exertion.   No wheezes GastrointestinaI: No nausea, vomiting, diarrhea, abdominal pain, fecal incontinence Genitourinary: Some urinary retention / BPH Musculoskeletal: No neck pain, back pain Integumentary: No rash, pruritus, skin lesions Neurological: as above Psychiatric: No depression at this time.  No anxiety Endocrine: No palpitations, diaphoresis, change in appetite, change in weigh or increased thirst Hematologic/Lymphatic: He has multiple myeloma with amyloidosis. Allergic/Immunologic: No itchy/runny eyes, nasal congestion, recent allergic reactions, rashes  ALLERGIES: Allergies  Allergen Reactions  . Penicillins Swelling and Rash    HOME MEDICATIONS:  Current Outpatient Medications:  .  ANDROGEL PUMP 20.25 MG/ACT (1.62%) GEL, Apply 3 application topically every morning. , Disp: , Rfl:  .  dexamethasone (DECADRON) 4 MG tablet, TAKE 2.5 TABLETS (10 MG TOTAL) BY MOUTH ONCE A WEEK FOR 120 DAYS, Disp: , Rfl:  .  fluconazole (DIFLUCAN) 200 MG tablet, Take 200 mg by mouth daily. Takes once per week on  Sundays, Disp: , Rfl:  .  zolpidem (AMBIEN) 10 MG tablet, Take 10 mg by mouth at bedtime. Patient takes prn, Disp: , Rfl:   PAST MEDICAL HISTORY: Past Medical History:  Diagnosis Date  . Amyloidosis ([place])   . GERD (gastroesophageal reflux disease)   . Hearing loss    Has hearing aids  . History of kidney stones   . Hyperlipidemia   . Multiple myeloma (H[facility])    and amylodosis   . Nephrolithiasis   . Prostatitis    acute and chronic    PAST SURGICAL HISTORY: Past Surgical History:  Procedure Laterality Date  . APPENDECTOMY     . CARDIAC CATHETERIZATION N/A 06/13/2015   Procedure: Left Heart Cath and Coronary Angiography;  Surgeon: Jettie Booze, MD;  Location: [place] [facility];  Service: Cardiovascular;  Laterality: N/A;  . COLONOSCOPY  04/07/00  . ELBOW SURGERY  2003   right  . EXTRACORPOREAL SHOCK WAVE LITHOTRIPSY Left 07/17/2018   Procedure: EXTRACORPOREAL SHOCK WAVE LITHOTRIPSY (ESWL);  Surgeon: [name], MD;  [facility];  Service: Urology;  Laterality: Left;  . HERNIA REPAIR    . ROTATOR CUFF REPAIR      FAMILY HISTORY: Family History  Problem Relation Age of Onset  . Pancreatic cancer Father   . Parkinsonism Mother   . CAD Brother 77    SOCIAL HISTORY:  Social History   Socioeconomic History  . Marital status: Married    Spouse name: Not on file  . Number of children: 2  . Years of education: Not on file  . Highest education level: Not on file  Occupational History  . Occupation: retired  Scientific laboratory technician  . Financial resource strain: Not on file  . Food insecurity    Worry: Not on file    Inability: Not on file  . Transportation needs    Medical: Not on file    Non-medical: Not on file  Tobacco Use  . Smoking status: Former Smoker    Quit date: 03/15/1977    Years since quitting: 41.6  . Smokeless tobacco: Never Used  . Tobacco comment: quit  35 years ago  Substance and Sexual Activity  . Alcohol use: Yes    Alcohol/week: 0.0 standard drinks    Comment: approx 1 drink/night  . Drug use: No  . Sexual activity: Yes    Birth control/protection: None  Lifestyle  . Physical activity    Days per week: Not on file    Minutes per session: Not on file  . Stress: Not on file  Relationships  . Social Herbalist on phone: Not on file    Gets together: Not on file    Attends religious service: Not on file    Active member of club or organization: Not on file    Attends meetings of clubs or organizations: Not on file    Relationship status: Not on file  . Intimate  partner violence    Fear of current or ex partner: Not on file    Emotionally abused: Not on file    Physically abused: Not on file    Forced sexual activity: Not on file  Other Topics Concern  . Not on file  Social History Narrative   Married, 2 children   Retired criminal Counsellor and [employer]   1 drink/day     PHYSICAL EXAM  Vitals:   11/01/18 1640  BP: 129/70  Pulse: 72  Temp: (!) 96.4 F (35.8 C)  Weight: 152 lb (68.9 kg)    Body mass index is 24.53 kg/m.   General: The patient is well-developed and well-nourished and in no acute distress  Neurologic Exam  Mental status: The patient is alert and oriented x 3 (off by one day) at the time of the examination.  He scored 29/30 on the [facility] cognitive assessment losing 1 point for repetition and language.  He scored 5/5 in delayed recall and had good visual spatial/executive function pattern continuation with one error.  Speech is normal.     Cranial nerves: Extraocular movements are full.      Facial strength is normal.  Trapezius and sternocleidomastoid strength is normal. No dysarthria is noted.  The tongue is midline, and the patient has symmetric elevation of the soft palate. No obvious hearing deficits are noted.  Motor: He has a 6 to 7 Hz low amplitude tremor in the left hand more than the right hand.  The tremor was most noticeable drawing spirals.  Muscle bulk is normal.   Tone is normal. Strength is  5 / 5 in all 4 extremities.   Sensory: Sensory testing is Intact to touch, temperature and vibration..  Coordination: Cerebellar testing reveals good finger-nose-finger and heel-to-shin bilaterally.  Gait and station: Station is normal.  Gait and tandem gait are normal.  Reflexes: Deep tendon reflexes are symmetric and normal bilaterally.      DIAGNOSTIC DATA (LABS, IMAGING, TESTING) - I reviewed patient records, labs, notes, testing and imaging myself where available.  Lab  Results  Component Value Date   WBC 10.0 07/13/2018   HGB 16.8 07/13/2018   HCT 52.9 (H) 07/13/2018   MCV 95.5 07/13/2018   PLT 312 07/13/2018      Component Value Date/Time   NA 138 07/13/2018 0004   NA 139 04/03/2015 1445   NA 141 07/29/2014 1404   NA 141 04/17/2013 1044   K 3.4 (L) 07/13/2018 0004   K 3.8 04/03/2015 1445   K 3.5 07/29/2014 1404   K 4.1 04/17/2013 1044   CL 103 07/13/2018 0004   CL 106 04/03/2015  1445   CL 102 07/29/2014 1404   CO2 24 07/13/2018 0004   CO2 23 04/03/2015 1445   CO2 30 07/29/2014 1404   CO2 28 04/17/2013 1044   GLUCOSE 123 (H) 07/13/2018 0004   GLUCOSE 129 (H) 07/29/2014 1404   BUN 34 (H) 07/13/2018 0004   BUN 18 04/03/2015 1445   BUN 16 07/29/2014 1404   BUN 17.3 04/17/2013 1044   CREATININE 1.26 (H) 07/13/2018 0004   CREATININE 1.09 06/21/2018 0852   CREATININE 0.99 06/10/2015 1330   CREATININE 1.1 04/17/2013 1044   CALCIUM 9.6 07/13/2018 0004   CALCIUM 9.4 04/03/2015 1445   CALCIUM 9.4 07/29/2014 1404   CALCIUM 9.8 04/17/2013 1044   PROT 6.1 (L) 06/21/2018 0852   PROT 7.1 04/03/2015 1445   PROT 7.4 07/29/2014 1404   PROT 7.6 04/17/2013 1044   ALBUMIN 3.9 06/21/2018 0852   ALBUMIN 4.2 04/03/2015 1445   ALBUMIN 4.1 04/17/2013 1044   AST 15 06/21/2018 0852   AST 16 04/17/2013 1044   ALT 13 06/21/2018 0852   ALT 20 07/29/2014 1404   ALT 17 04/17/2013 1044   ALKPHOS 86 06/21/2018 0852   ALKPHOS 80 04/03/2015 1445   ALKPHOS 79 07/29/2014 1404   ALKPHOS 89 04/17/2013 1044   BILITOT 0.4 06/21/2018 0852   BILITOT 0.45 04/17/2013 1044   GFRNONAA 56 (L) 07/13/2018 0004   GFRNONAA >60 06/21/2018 0852   GFRAA >60 07/13/2018 0004   GFRAA >60 06/21/2018 0852   Lab Results  Component Value Date   CHOL 189 03/29/2011   HDL 45.30 03/29/2011   LDLCALC 126 (H) 03/29/2011   TRIG 87.0 03/29/2011   CHOLHDL 4 03/29/2011       ASSESSMENT AND PLAN    1. Memory disorder   2. Multiple myeloma in remission ([place])   3. Benign  essential tremor   4. Insomnia, unspecified type      1.    The MCI is doing well and he scored within normal range on the MoCA today 2.   Add a beta blocker which could help tremor  3.   Stay active and exercise as tolerated. RTC 12 months or sooner if new or worsening neurologic issues.   A. Felecia Shelling, MD, PhD 11/29/9148, 5:69 PM Certified in Neurology, Clinical Neurophysiology, Sleep Medicine, Pain Medicine and Neuroimaging  [facility] [address], [place], [place] [address] [phone]

## 2018-11-02 ENCOUNTER — Encounter: Payer: Self-pay | Admitting: Neurology

## 2018-11-03 DIAGNOSIS — C9000 Multiple myeloma not having achieved remission: Secondary | ICD-10-CM | POA: Diagnosis not present

## 2018-11-03 DIAGNOSIS — N401 Enlarged prostate with lower urinary tract symptoms: Secondary | ICD-10-CM | POA: Diagnosis not present

## 2018-11-03 DIAGNOSIS — E859 Amyloidosis, unspecified: Secondary | ICD-10-CM | POA: Diagnosis not present

## 2018-11-03 DIAGNOSIS — Z23 Encounter for immunization: Secondary | ICD-10-CM | POA: Diagnosis not present

## 2018-11-03 DIAGNOSIS — E785 Hyperlipidemia, unspecified: Secondary | ICD-10-CM | POA: Diagnosis not present

## 2018-11-03 DIAGNOSIS — Z1331 Encounter for screening for depression: Secondary | ICD-10-CM | POA: Diagnosis not present

## 2018-11-03 DIAGNOSIS — I2510 Atherosclerotic heart disease of native coronary artery without angina pectoris: Secondary | ICD-10-CM | POA: Diagnosis not present

## 2018-11-03 DIAGNOSIS — H9190 Unspecified hearing loss, unspecified ear: Secondary | ICD-10-CM | POA: Diagnosis not present

## 2018-11-03 DIAGNOSIS — R413 Other amnesia: Secondary | ICD-10-CM | POA: Diagnosis not present

## 2018-11-03 DIAGNOSIS — N200 Calculus of kidney: Secondary | ICD-10-CM | POA: Diagnosis not present

## 2018-11-03 DIAGNOSIS — Z0000 Encounter for general adult medical examination without abnormal findings: Secondary | ICD-10-CM | POA: Diagnosis not present

## 2018-11-03 DIAGNOSIS — E291 Testicular hypofunction: Secondary | ICD-10-CM | POA: Diagnosis not present

## 2018-11-28 DIAGNOSIS — L720 Epidermal cyst: Secondary | ICD-10-CM | POA: Diagnosis not present

## 2018-11-28 DIAGNOSIS — L821 Other seborrheic keratosis: Secondary | ICD-10-CM | POA: Diagnosis not present

## 2018-11-28 DIAGNOSIS — D1801 Hemangioma of skin and subcutaneous tissue: Secondary | ICD-10-CM | POA: Diagnosis not present

## 2018-11-29 DIAGNOSIS — Z5112 Encounter for antineoplastic immunotherapy: Secondary | ICD-10-CM | POA: Diagnosis not present

## 2018-11-29 DIAGNOSIS — Z87891 Personal history of nicotine dependence: Secondary | ICD-10-CM | POA: Diagnosis not present

## 2018-11-29 DIAGNOSIS — C9000 Multiple myeloma not having achieved remission: Secondary | ICD-10-CM | POA: Diagnosis not present

## 2018-11-29 DIAGNOSIS — K4090 Unilateral inguinal hernia, without obstruction or gangrene, not specified as recurrent: Secondary | ICD-10-CM | POA: Diagnosis not present

## 2018-11-29 DIAGNOSIS — G250 Essential tremor: Secondary | ICD-10-CM | POA: Diagnosis not present

## 2018-11-29 DIAGNOSIS — K8689 Other specified diseases of pancreas: Secondary | ICD-10-CM | POA: Diagnosis not present

## 2018-11-29 DIAGNOSIS — N200 Calculus of kidney: Secondary | ICD-10-CM | POA: Diagnosis not present

## 2018-11-29 DIAGNOSIS — E8581 Light chain (AL) amyloidosis: Secondary | ICD-10-CM | POA: Diagnosis not present

## 2018-11-29 DIAGNOSIS — H532 Diplopia: Secondary | ICD-10-CM | POA: Diagnosis not present

## 2018-11-29 DIAGNOSIS — R972 Elevated prostate specific antigen [PSA]: Secondary | ICD-10-CM | POA: Diagnosis not present

## 2018-11-29 DIAGNOSIS — Z79899 Other long term (current) drug therapy: Secondary | ICD-10-CM | POA: Diagnosis not present

## 2018-11-29 DIAGNOSIS — I2510 Atherosclerotic heart disease of native coronary artery without angina pectoris: Secondary | ICD-10-CM | POA: Diagnosis not present

## 2018-11-29 DIAGNOSIS — E559 Vitamin D deficiency, unspecified: Secondary | ICD-10-CM | POA: Diagnosis not present

## 2018-11-29 DIAGNOSIS — R5383 Other fatigue: Secondary | ICD-10-CM | POA: Diagnosis not present

## 2018-11-29 DIAGNOSIS — Z7982 Long term (current) use of aspirin: Secondary | ICD-10-CM | POA: Diagnosis not present

## 2018-12-22 DIAGNOSIS — H00012 Hordeolum externum right lower eyelid: Secondary | ICD-10-CM | POA: Diagnosis not present

## 2018-12-22 DIAGNOSIS — H00022 Hordeolum internum right lower eyelid: Secondary | ICD-10-CM | POA: Diagnosis not present

## 2019-01-03 DIAGNOSIS — M25552 Pain in left hip: Secondary | ICD-10-CM | POA: Diagnosis not present

## 2019-01-03 DIAGNOSIS — E559 Vitamin D deficiency, unspecified: Secondary | ICD-10-CM | POA: Diagnosis not present

## 2019-01-03 DIAGNOSIS — R972 Elevated prostate specific antigen [PSA]: Secondary | ICD-10-CM | POA: Diagnosis not present

## 2019-01-03 DIAGNOSIS — R5383 Other fatigue: Secondary | ICD-10-CM | POA: Diagnosis not present

## 2019-01-03 DIAGNOSIS — H532 Diplopia: Secondary | ICD-10-CM | POA: Diagnosis not present

## 2019-01-03 DIAGNOSIS — M25551 Pain in right hip: Secondary | ICD-10-CM | POA: Diagnosis not present

## 2019-01-03 DIAGNOSIS — K4090 Unilateral inguinal hernia, without obstruction or gangrene, not specified as recurrent: Secondary | ICD-10-CM | POA: Diagnosis not present

## 2019-01-03 DIAGNOSIS — N200 Calculus of kidney: Secondary | ICD-10-CM | POA: Diagnosis not present

## 2019-01-03 DIAGNOSIS — Z87891 Personal history of nicotine dependence: Secondary | ICD-10-CM | POA: Diagnosis not present

## 2019-01-03 DIAGNOSIS — K8689 Other specified diseases of pancreas: Secondary | ICD-10-CM | POA: Diagnosis not present

## 2019-01-03 DIAGNOSIS — Z5112 Encounter for antineoplastic immunotherapy: Secondary | ICD-10-CM | POA: Diagnosis not present

## 2019-01-03 DIAGNOSIS — G250 Essential tremor: Secondary | ICD-10-CM | POA: Diagnosis not present

## 2019-01-03 DIAGNOSIS — Z7982 Long term (current) use of aspirin: Secondary | ICD-10-CM | POA: Diagnosis not present

## 2019-01-03 DIAGNOSIS — E8581 Light chain (AL) amyloidosis: Secondary | ICD-10-CM | POA: Diagnosis not present

## 2019-01-03 DIAGNOSIS — I2510 Atherosclerotic heart disease of native coronary artery without angina pectoris: Secondary | ICD-10-CM | POA: Diagnosis not present

## 2019-01-03 DIAGNOSIS — C9000 Multiple myeloma not having achieved remission: Secondary | ICD-10-CM | POA: Diagnosis not present

## 2019-01-03 DIAGNOSIS — Z79899 Other long term (current) drug therapy: Secondary | ICD-10-CM | POA: Diagnosis not present

## 2019-01-08 ENCOUNTER — Ambulatory Visit: Payer: Medicare Other | Admitting: Neurology

## 2019-01-31 DIAGNOSIS — C9000 Multiple myeloma not having achieved remission: Secondary | ICD-10-CM | POA: Diagnosis not present

## 2019-01-31 DIAGNOSIS — E8581 Light chain (AL) amyloidosis: Secondary | ICD-10-CM | POA: Diagnosis not present

## 2019-04-03 ENCOUNTER — Ambulatory Visit: Payer: Medicare Other | Attending: Internal Medicine

## 2019-04-03 DIAGNOSIS — Z23 Encounter for immunization: Secondary | ICD-10-CM | POA: Insufficient documentation

## 2019-04-03 NOTE — Progress Notes (Signed)
   Covid-19 Vaccination Clinic  Name:  William Barron    MRN: [id number] DOB: 02-Feb-1946  04/03/2019  William Barron was observed post Covid-19 immunization for 30 minutes based on pre-vaccination screening without incidence. He was provided with Vaccine Information Sheet and instruction to access the V-Safe system.   William Barron was instructed to call 911 with any severe reactions post vaccine: Marland Kitchen Difficulty breathing  . Swelling of your face and throat  . A fast heartbeat  . A bad rash all over your body  . Dizziness and weakness    Immunizations Administered    Name Date Dose VIS Date Route   Pfizer COVID-19 Vaccine 04/03/2019  3:06 PM 0.3 mL 02/23/2019 Intramuscular   Manufacturer: [employer]   Lot: [id number]   [place]: [id number]

## 2019-04-23 ENCOUNTER — Ambulatory Visit: Payer: Medicare Other | Attending: Internal Medicine

## 2019-04-23 DIAGNOSIS — Z23 Encounter for immunization: Secondary | ICD-10-CM | POA: Insufficient documentation

## 2019-04-23 NOTE — Progress Notes (Signed)
   Covid-19 Vaccination Clinic  Name:  William Barron    MRN: [id number] DOB: [birth year]  04/23/2019  [name] was observed post Covid-19 immunization for 15 minutes without incidence. He was provided with Vaccine Information Sheet and instruction to access the V-Safe system.   [name] was instructed to call 911 with any severe reactions post vaccine: Marland Kitchen Difficulty breathing  . Swelling of your face and throat  . A fast heartbeat  . A bad rash all over your body  . Dizziness and weakness    Immunizations Administered    Name Date Dose VIS Date Route   Pfizer COVID-19 Vaccine 04/23/2019  4:55 PM 0.3 mL 02/23/2019 Intramuscular   Manufacturer: [employer], [employer]   Lot: [id number]   [place]: [id number]

## 2019-04-30 ENCOUNTER — Ambulatory Visit (INDEPENDENT_AMBULATORY_CARE_PROVIDER_SITE_OTHER): Payer: Medicare Other | Admitting: Family Medicine

## 2019-04-30 ENCOUNTER — Encounter: Payer: Self-pay | Admitting: Family Medicine

## 2019-04-30 ENCOUNTER — Other Ambulatory Visit: Payer: Self-pay

## 2019-04-30 VITALS — BP 118/76 | HR 84 | Temp 96.9°F | Ht 66.0 in | Wt 154.6 lb

## 2019-04-30 DIAGNOSIS — H532 Diplopia: Secondary | ICD-10-CM | POA: Diagnosis not present

## 2019-04-30 DIAGNOSIS — C9001 Multiple myeloma in remission: Secondary | ICD-10-CM

## 2019-04-30 DIAGNOSIS — G250 Essential tremor: Secondary | ICD-10-CM | POA: Diagnosis not present

## 2019-04-30 NOTE — Patient Instructions (Addendum)
We will restart propranolol as directed.   We will consider Primidone if propranolol is not effective   Stay well hydrated and continue regular exercise.   Follow up in 1 year, sooner if needed.    Essential Tremor A tremor is trembling or shaking that a person cannot control. Most tremors affect the hands or arms. Tremors can also affect the head, vocal cords, legs, and other parts of the body. Essential tremor is a tremor without a known cause. Usually, it occurs while a person is trying to perform an action. It tends to get worse gradually as a person ages. What are the causes? The cause of this condition is not known. What increases the risk? You are more likely to develop this condition if:  You have a family member with essential tremor.  You are age 60 or older.  You take certain medicines. What are the signs or symptoms? The main sign of a tremor is a rhythmic shaking of certain parts of your body that is uncontrolled and unintentional. You may:  Have difficulty eating with a spoon or fork.  Have difficulty writing.  Nod your head up and down or side to side.  Have a quivering voice. The shaking may:  Get worse over time.  Come and go.  Be more noticeable on one side of your body.  Get worse due to stress, fatigue, caffeine, and extreme heat or cold. How is this diagnosed? This condition may be diagnosed based on:  Your symptoms and medical history.  A physical exam. There is no single test to diagnose an essential tremor. However, your health care provider may order tests to rule out other causes of your condition. These may include:  Blood and urine tests.  Imaging studies of your brain, such as CT scan and MRI.  A test that measures involuntary muscle movement (electromyogram). How is this treated? Treatment for essential tremor depends on the severity of the condition.  Some tremors may go away without treatment.  Mild tremors may not need  treatment if they do not affect your day-to-day life.  Severe tremors may need to be treated using one or more of the following options: ? Medicines. ? Lifestyle changes. ? Occupational or physical therapy. Follow these instructions at home: Lifestyle   Do not use any products that contain nicotine or tobacco, such as cigarettes and e-cigarettes. If you need help quitting, ask your health care provider.  Limit your caffeine intake as told by your health care provider.  Try to get 8 hours of sleep each night.  Find ways to manage your stress that fits your lifestyle and personality. Consider trying meditation or yoga.  Try to anticipate stressful situations and allow extra time to manage them.  If you are struggling emotionally with the effects of your tremor, consider working with a mental health provider. General instructions  Take over-the-counter and prescription medicines only as told by your health care provider.  Avoid extreme heat and extreme cold.  Keep all follow-up visits as told by your health care provider. This is important. Visits may include physical therapy visits. Contact a health care provider if:  You experience any changes in the location or intensity of your tremors.  You start having a tremor after starting a new medicine.  You have tremor with other symptoms, such as: ? Numbness. ? Tingling. ? Pain. ? Weakness.  Your tremor gets worse.  Your tremor interferes with your daily life.  You feel down, blue, or  sad for at least 2 weeks in a row.  Worrying about your tremor and what other people think about you interferes with your everyday life functions, including relationships, work, or school. Summary  Essential tremor is a tremor without a known cause. Usually, it occurs when you are trying to perform an action.  The cause of this condition is not known.  The main sign of a tremor is a rhythmic shaking of certain parts of your body that is  uncontrolled and unintentional.  Treatment for essential tremor depends on the severity of the condition. This information is not intended to replace advice given to you by your health care provider. Make sure you discuss any questions you have with your health care provider. Document Revised: 03/11/2017 Document Reviewed: 03/11/2017 Elsevier Patient Education  [place].    Propranolol Extended-Release Capsules What is this medicine? PROPRANOLOL (proe PRAN oh lole) is a beta blocker. It decreases the amount of work your heart has to do and helps your heart beat regularly. It treats high blood pressure and/or prevent chest pain (also called angina). This medicine may be used for other purposes; ask your health care provider or pharmacist if you have questions. COMMON BRAND NAME(S): Inderal LA, Inderal XL, InnoPran XL What should I tell my health care provider before I take this medicine? They need to know if you have any of these conditions:  circulation problems, or blood vessel disease  diabetes  history of heart attack or heart disease, vasospastic angina  kidney disease  liver disease  lung or breathing disease, like asthma or emphysema  pheochromocytoma  slow heart rate  thyroid disease  an unusual or allergic reaction to propranolol, other beta-blockers, medicines, foods, dyes, or preservatives  pregnant or trying to get pregnant  breast-feeding How should I use this medicine? Take this drug by mouth. Take it as directed on the prescription label at the same time every day. Do not cut, crush or chew this drug. Swallow the capsules whole. You can take it with or without food. If it upsets your stomach, take it with food. Keep taking it unless your health care provider tells you to stop. Talk to your health care provider about the use of this drug in children. Special care may be needed. Overdosage: If you think you have taken too much of this medicine contact  a poison control center or emergency room at once. NOTE: This medicine is only for you. Do not share this medicine with others. What if I miss a dose? If you miss a dose, take it as soon as you can. If it is almost time for your next dose, take only that dose. Do not take double or extra doses. What may interact with this medicine? Do not take this medicine with any of the following medications:  feverfew  phenothiazines like chlorpromazine, mesoridazine, prochlorperazine, thioridazine This medicine may also interact with the following medications:  aluminum hydroxide gel  antipyrine  antiviral medicines for HIV or AIDS  barbiturates like phenobarbital  certain medicines for blood pressure, heart disease, irregular heart beat  cimetidine  ciprofloxacin  diazepam  fluconazole  haloperidol  isoniazid  medicines for cholesterol like cholestyramine or colestipol  medicines for mental depression  medicines for migraine headache like almotriptan, eletriptan, frovatriptan, naratriptan, rizatriptan, sumatriptan, zolmitriptan  NSAIDs, medicines for pain and inflammation, like ibuprofen or naproxen  phenytoin  rifampin  teniposide  theophylline  thyroid medicines  tolbutamide  warfarin  zileuton This list may not  describe all possible interactions. Give your health care provider a list of all the medicines, herbs, non-prescription drugs, or dietary supplements you use. Also tell them if you smoke, drink alcohol, or use illegal drugs. Some items may interact with your medicine. What should I watch for while using this medicine? Visit your doctor or health care professional for regular check ups. Contact your doctor right away if your symptoms worsen. Check your blood pressure and pulse rate regularly. Ask your health care professional what your blood pressure and pulse rate should be, and when you should contact them. Do not stop taking this medicine suddenly. This  could lead to serious heart-related effects. You may get drowsy or dizzy. Do not drive, use machinery, or do anything that needs mental alertness until you know how this drug affects you. Do not stand or sit up quickly, especially if you are an older patient. This reduces the risk of dizzy or fainting spells. Alcohol can make you more drowsy and dizzy. Avoid alcoholic drinks. This medicine may increase blood sugar. Ask your healthcare provider if changes in diet or medicines are needed if you have diabetes. Do not treat yourself for coughs, colds, or pain while you are taking this medicine without asking your doctor or health care professional for advice. Some ingredients may increase your blood pressure. What side effects may I notice from receiving this medicine? Side effects that you should report to your doctor or health care professional as soon as possible:  allergic reactions like skin rash, itching or hives, swelling of the face, lips, or tongue  breathing problems  cold hands or feet  difficulty sleeping, nightmares  dry peeling skin  hallucinations  muscle cramps or weakness   signs and symptoms of high blood sugar such as being more thirsty or hungry or having to urinate more than normal. You may also feel very tired or have blurry vision.  slow heart rate  swelling of the legs and ankles  vomiting Side effects that usually do not require medical attention (report to your doctor or health care professional if they continue or are bothersome):  change in sex drive or performance  diarrhea  dry sore eyes  hair loss  nausea  weak or tired This list may not describe all possible side effects. Call your doctor for medical advice about side effects. You may report side effects to FDA at 1-800-FDA-1088. Where should I keep my medicine? Keep out of the reach of children and pets. Store at room temperature between 15 and 30 degrees C (59 and 86 degrees F). Protect from  light and moisture. Keep the container tightly closed. Avoid exposure to extreme heat. Do not freeze. Throw away any unused drug after the expiration date. NOTE: This sheet is a summary. It may not cover all possible information. If you have questions about this medicine, talk to your doctor, pharmacist, or health care provider.  2020 Elsevier/Gold Standard (2018-10-09 16:23:26)   Primidone tablets What is this medicine? PRIMIDONE (PRI mi done) is a barbiturate. This medicine is used to control seizures in certain types of epilepsy. It is not for use in absence (petit mal) seizures. This medicine may be used for other purposes; ask your health care provider or pharmacist if you have questions. COMMON BRAND NAME(S): Mysoline What should I tell my health care provider before I take this medicine? They need to know if you have any of these conditions:  kidney disease  liver disease  porphyria  suicidal thoughts,  plans, or attempt; a previous suicide attempt by you or a family member  an unusual or allergic reaction to primidone, phenobarbital, other barbiturates or seizure medications, other medicines, foods, dyes, or preservatives  pregnant or trying to get pregnant  breast-feeding How should I use this medicine? Take this medicine by mouth with a glass of water. Follow the directions on the prescription label. Take your doses at regular intervals. Do not take your medicine more often than directed. Do not stop taking except on the advice of your doctor or health care professional. A special MedGuide will be given to you by the pharmacist with each prescription and refill. Be sure to read this information carefully each time. Contact your pediatrician or health care professional regarding the use of this medication in children. Special care may be needed. While this drug may be prescribed for children for selected conditions, precautions do apply. Overdosage: If you think you have taken  too much of this medicine contact a poison control center or emergency room at once. NOTE: This medicine is only for you. Do not share this medicine with others. What if I miss a dose? If you miss a dose, take it as soon as you can. If it is almost time for your next dose, take only that dose. Do not take double or extra doses. What may interact with this medicine? Do not take this medicine with any of the following medications:  voriconazole This medicine may also interact with the following medications:  cancer-treating medications  cyclosporine  disopyramide  doxycycline  male hormones, including contraceptive or birth control pills  medicines for mental depression, anxiety or other mood problems  medicines for treating HIV infection or AIDS  modafinil  prescription pain medications  quinidine  warfarin This list may not describe all possible interactions. Give your health care provider a list of all the medicines, herbs, non-prescription drugs, or dietary supplements you use. Also tell them if you smoke, drink alcohol, or use illegal drugs. Some items may interact with your medicine. What should I watch for while using this medicine? Visit your doctor or health care professional for regular checks on your progress. It may be 2 to 3 weeks before you see the full effects of this medicine. Do not suddenly stop taking this medicine, you may increase the risk of seizures. Your doctor or health care professional may want to gradually reduce the dose. Wear a medical identification bracelet or chain to say you have epilepsy, and carry a card that lists all your medications. You may get drowsy or dizzy. Do not drive, use machinery, or do anything that needs mental alertness until you know how this medicine affects you. Do not stand or sit up quickly, especially if you are an older patient. This reduces the risk of dizzy or fainting spells. Alcohol may interfere with the effect of this  medicine. Avoid alcoholic drinks. Birth control pills may not work properly while you are taking this medicine. Talk to your doctor about using an extra method of birth control. The use of this medicine may increase the chance of suicidal thoughts or actions. Pay special attention to how you are responding while on this medicine. Any worsening of mood, or thoughts of suicide or dying should be reported to your health care professional right away. Women who become pregnant while using this medicine may enroll in the [place] Pregnancy Registry by calling [phone]. This registry collects information about the safety of antiepileptic drug use  during pregnancy. This medicine may cause a decrease in vitamin D and folic acid. You should make sure that you get enough vitamins while you are taking this medicine. Discuss the foods you eat and the vitamins you take with your health care professional. What side effects may I notice from receiving this medicine? Side effects that you should report to your doctor or health care professional as soon as possible:  allergic reactions like skin rash, itching or hives, swelling of the face, lips, or tongue  blurred, double vision, or uncontrollable rolling or movements of the eyes  redness, blistering, peeling or loosening of the skin, including inside the mouth  shortness of breath or difficulty breathing  unusual excitement or restlessness, more likely in children and the elderly  unusually weak or tired  worsening of mood, thoughts or actions of suicide or dying Side effects that usually do not require medical attention (report to your doctor or health care professional if they continue or are bothersome):  clumsiness, unsteadiness, or a hang-over effect  decreased sexual ability  dizziness, drowsiness  loss of appetite  nausea or vomiting This list may not describe all possible side effects. Call your doctor for  medical advice about side effects. You may report side effects to FDA at 1-800-FDA-1088. Where should I keep my medicine? Keep out of the reach of children. This medicine may cause accidental overdose and death if it taken by other adults, children, or pets. Mix any unused medicine with a substance like cat litter or coffee grounds. Then throw the medicine away in a sealed container like a sealed bag or a coffee can with a lid. Do not use the medicine after the expiration date. Store at room temperature between 15 and 30 degrees C (59 and 86 degrees F). NOTE: This sheet is a summary. It may not cover all possible information. If you have questions about this medicine, talk to your doctor, pharmacist, or health care provider.  2020 Elsevier/Gold Standard (2016-10-13 15:21:47)

## 2019-04-30 NOTE — Progress Notes (Signed)
I have read the note, and I agree with the clinical assessment and plan.  Ameri Cahoon A. Tamrah Victorino, MD, PhD, FAAN Certified in Neurology, Clinical Neurophysiology, Sleep Medicine, Pain Medicine and Neuroimaging  [facility] [address], [address] [place], [place] [address] [phone]  

## 2019-04-30 NOTE — Progress Notes (Signed)
PATIENT: William Barron DOB: 01/20/1946  REASON FOR VISIT: follow up HISTORY FROM: patient  Chief Complaint  Patient presents with  . Follow-up    Alone. Rm 1. Patient mentioned that his tremors have been gradually getting worse.      HISTORY OF PRESENT ILLNESS: Today 04/30/19 William Barron is a 74 y.o. male here today for follow up for tremor and memory. He feels that his memory is good. He does not have any concerns with memory at all and feels that since stress levels have decreased, memory has not been a concern. He continues chemo monthly for multiple myeloma.   He does feel that tremor is gradually worsening. It is worse with activity or stress. He notes significant difficulty with writing. He took propranolol ER 24m daily for about 2 weeks when last seen in 10/2018. He is not certain that he noted any significant benefit. He tolerated medication well with no obvious adverse effects.   He is exercising daily. He will lift light weights and ride a recumbent bike.    HISTORY: (copied from Dr SGarth Bignessnote on 11/01/2018)  William Barron a 74y.o. man with mild cognitive impairment.    Update 11/01/2018:  He feels his memory issues are stable.   He has had no difficulties remembering to do things around the house.  Driving is fine.  Mood is doing well.  He is sleeping well at night.  He is noting more issues with the hand tremor.   He notes the tremor is worse if he is upset.  He had some diplopia and got prism glasses which have helped.  Montreal Cognitive Assessment  11/01/2018 01/03/2018 01/03/2018 11/04/2015 09/26/2014  Visuospatial/ Executive (0/5) '4 5 5 5 5  ' Naming (0/3) '3 3 3 3 2  ' Attention: Read list of digits (0/2) '2 2 2 2 1  ' Attention: Read list of letters (0/1) '1 1 1 1 1  ' Attention: Serial 7 subtraction starting at 100 (0/3) '3 3 3 3 3  ' Language: Repeat phrase (0/2) '1 1 1 1 2  ' Language : Fluency (0/1) '1 1 1 1 1  ' Abstraction (0/2) '2 2 2 2 2    ' Delayed Recall (0/5) '5 5 5 2 3  ' Orientation (0/6) '6 6 6 6 6  ' Total '28 29 29 26 26  ' Adjusted Score (based on education) - - '29 26 26     ' Update 01/03/2018: Since the last visit, he has noted more difficulties with tremors in the arms/hands and he is walking slower and more stooped.    His tremor is worse with intention and also when he stands up.    The right leg will sometimes shake when he stnds up straight.     He has not had difficulty with falls/stumbles.    He uses the rail to use stairs but has done so for at least a few years.      He  plays golf but notes putting is worse.     His mother had Prakinson's disease  He feels memory is doing better.   He is recalling better and scored 29/30 on the MoCA today.   He notes diplopa upon right gaze.  This bothers him more at night.     He will be getting prism glasses.                Montreal Cognitive Assessment  01/03/2018 01/03/2018 11/04/2015 09/26/2014  Visuospatial/ Executive (0/5) '5 5 5 ' 5  Naming (0/3) '3 3 3 2  ' Attention: Read list of digits (0/2) '2 2 2 1  ' Attention: Read list of letters (0/1) '1 1 1 1  ' Attention: Serial 7 subtraction starting at 100 (0/3) '3 3 3 3  ' Language: Repeat phrase (0/2) '1 1 1 2  ' Language : Fluency (0/1) '1 1 1 1  ' Abstraction (0/2) '2 2 2 2  ' Delayed Recall (0/5) '5 5 2 3  ' Orientation (0/6) '6 6 6 6  ' Total '29 29 26 26  ' Adjusted Score (based on education) - '29 26 26      ' REVIEW OF SYSTEMS: Out of a complete 14 system review of symptoms, the patient complains only of the following symptoms, tremor and all other reviewed systems are negative.   ALLERGIES: Allergies  Allergen Reactions  . Penicillins Swelling and Rash    HOME MEDICATIONS: Outpatient Medications Prior to Visit  Medication Sig Dispense Refill  . ANDROGEL PUMP 20.25 MG/ACT (1.62%) GEL Apply 3 application topically every morning.     . fluconazole (DIFLUCAN) 200 MG tablet Take 200 mg by mouth daily. Takes once per week on  Sundays     . zolpidem (AMBIEN) 10 MG tablet Take 10 mg by mouth at bedtime. Patient takes prn    . propranolol ER (INDERAL LA) 60 MG 24 hr capsule Take 1 capsule (60 mg total) by mouth daily. (Patient not taking: Reported on 04/30/2019) 30 capsule 11  . dexamethasone (DECADRON) 4 MG tablet TAKE 2.5 TABLETS (10 MG TOTAL) BY MOUTH ONCE A WEEK FOR 120 DAYS     No facility-administered medications prior to visit.    PAST MEDICAL HISTORY: Past Medical History:  Diagnosis Date  . Amyloidosis ([place])   . GERD (gastroesophageal reflux disease)   . Hearing loss    Has hearing aids  . History of kidney stones   . Hyperlipidemia   . Multiple myeloma ([facility])    and amylodosis   . Nephrolithiasis   . Prostatitis    acute and chronic    PAST SURGICAL HISTORY: Past Surgical History:  Procedure Laterality Date  . APPENDECTOMY    . CARDIAC CATHETERIZATION N/A 06/13/2015   Procedure: Left Heart Cath and Coronary Angiography;  Surgeon: Jettie Booze, MD;  Location: [facility];  Service: Cardiovascular;  Laterality: N/A;  . COLONOSCOPY  04/07/00  . ELBOW SURGERY  2003   right  . EXTRACORPOREAL SHOCK WAVE LITHOTRIPSY Left 07/17/2018   Procedure: EXTRACORPOREAL SHOCK WAVE LITHOTRIPSY (ESWL);  Surgeon: [name], MD;  Location: [facility];  Service: Urology;  Laterality: Left;  . HERNIA REPAIR    . ROTATOR CUFF REPAIR      FAMILY HISTORY: Family History  Problem Relation Age of Onset  . Pancreatic cancer Father   . Parkinsonism Mother   . CAD Brother 54    SOCIAL HISTORY: Social History   Socioeconomic History  . Marital status: Married    Spouse name: Not on file  . Number of children: 2  . Years of education: Not on file  . Highest education level: Not on file  Occupational History  . Occupation: retired  Tobacco Use  . Smoking status: Former Smoker    Quit date: 03/15/1977    Years since quitting: 42.1  . Smokeless tobacco: Never Used  . Tobacco comment: quit 35 years ago   Substance and Sexual Activity  . Alcohol use: Yes    Alcohol/week: 0.0 standard drinks    Comment: approx 1 drink/night  .  Drug use: No  . Sexual activity: Yes    Birth control/protection: None  Other Topics Concern  . 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 Difficulty of Paying Living Expenses: Not on file  Food Insecurity:   . Worried About Charity fundraiser in the Last Year: Not on file  . Ran Out of Food in the Last Year: Not on file  Transportation Needs:   . Lack of Transportation (Medical): Not on file  . Lack of Transportation (Non-Medical): Not on file  Physical Activity:   . Days of Exercise per Week: Not on file  . Minutes of Exercise per Session: Not on file  Stress:   . Feeling of Stress : Not on file  Social Connections:   . Frequency of Communication with Friends and Family: Not on file  . Frequency of Social Gatherings with Friends and Family: Not on file  . Attends Religious Services: Not on file  . Active Member of Clubs or Organizations: Not on file  . Attends Archivist Meetings: Not on file  . Marital Status: Not on file  Intimate Partner Violence:   . Fear of Current or Ex-Partner: Not on file  . Emotionally Abused: Not on file  . Physically Abused: Not on file  . Sexually Abused: Not on file      PHYSICAL EXAM  Vitals:   04/30/19 1030  BP: 118/76  Pulse: 84  Temp: (!) 96.9 F (36.1 C)  TempSrc: Oral  Weight: 154 lb 9.6 oz (70.1 kg)  Height: '5\' 6"'  (1.676 m)   Body mass index is 24.95 kg/m.  Generalized: Well developed, in no acute distress  Cardiology: normal rate and rhythm, no murmur noted Respiratory: clear to ascultation bilaterally  Neurological examination  Mentation: Alert oriented to time, place, history taking. Follows all commands speech  and language fluent Cranial nerve II-XII: Pupils were equal round reactive to light. Extraocular movements were full, visual field were full on confrontational test with glasses on; patient reports diplopia without glasses.  Motor: The motor testing reveals 5 over 5 strength of all 4 extremities. Good symmetric motor tone is noted throughout. Action tremor of bilateral upper extremities R> L Sensory: Sensory testing is intact to soft touch on all 4 extremities. No evidence of extinction is noted.  Coordination: Cerebellar testing reveals good finger-nose-finger and heel-to-shin bilaterally.  Gait and station: Gait is normal.   DIAGNOSTIC DATA (LABS, IMAGING, TESTING) - I reviewed patient records, labs, notes, testing and imaging myself where available.  No flowsheet data found.   Lab Results  Component Value Date   WBC 10.0 07/13/2018   HGB 16.8 07/13/2018   HCT 52.9 (H) 07/13/2018   MCV 95.5 07/13/2018   PLT 312 07/13/2018      Component Value Date/Time   NA 138 07/13/2018 0004   NA 139 04/03/2015 1445   NA 141 07/29/2014 1404   NA 141 04/17/2013 1044   K 3.4 (L) 07/13/2018 0004   K 3.8 04/03/2015 1445   K 3.5 07/29/2014 1404   K 4.1 04/17/2013 1044   CL 103 07/13/2018 0004   CL 106 04/03/2015 1445   CL 102 07/29/2014 1404   CO2 24 07/13/2018 0004   CO2 23 04/03/2015 1445   CO2 30 07/29/2014 1404  CO2 28 04/17/2013 1044   GLUCOSE 123 (H) 07/13/2018 0004   GLUCOSE 129 (H) 07/29/2014 1404   BUN 34 (H) 07/13/2018 0004   BUN 18 04/03/2015 1445   BUN 16 07/29/2014 1404   BUN 17.3 04/17/2013 1044   CREATININE 1.26 (H) 07/13/2018 0004   CREATININE 1.09 06/21/2018 0852   CREATININE 0.99 06/10/2015 1330   CREATININE 1.1 04/17/2013 1044   CALCIUM 9.6 07/13/2018 0004   CALCIUM 9.4 04/03/2015 1445   CALCIUM 9.4 07/29/2014 1404   CALCIUM 9.8 04/17/2013 1044   PROT 6.1 (L) 06/21/2018 0852   PROT 7.1 04/03/2015 1445   PROT 7.4 07/29/2014 1404   PROT 7.6 04/17/2013 1044    ALBUMIN 3.9 06/21/2018 0852   ALBUMIN 4.2 04/03/2015 1445   ALBUMIN 4.1 04/17/2013 1044   AST 15 06/21/2018 0852   AST 16 04/17/2013 1044   ALT 13 06/21/2018 0852   ALT 20 07/29/2014 1404   ALT 17 04/17/2013 1044   ALKPHOS 86 06/21/2018 0852   ALKPHOS 80 04/03/2015 1445   ALKPHOS 79 07/29/2014 1404   ALKPHOS 89 04/17/2013 1044   BILITOT 0.4 06/21/2018 0852   BILITOT 0.45 04/17/2013 1044   GFRNONAA 56 (L) 07/13/2018 0004   GFRNONAA >60 06/21/2018 0852   GFRAA >60 07/13/2018 0004   GFRAA >60 06/21/2018 0852   Lab Results  Component Value Date   CHOL 189 03/29/2011   HDL 45.30 03/29/2011   LDLCALC 126 (H) 03/29/2011   TRIG 87.0 03/29/2011   CHOLHDL 4 03/29/2011   No results found for: HGBA1C Lab Results  Component Value Date   VITAMINB12 359 09/26/2014   Lab Results  Component Value Date   TSH 1.81 06/10/2015     ASSESSMENT AND PLAN 74 y.o. year old male  has a past medical history of Amyloidosis ([place]), GERD (gastroesophageal reflux disease), Hearing loss, History of kidney stones, Hyperlipidemia, Multiple myeloma ([place]), Nephrolithiasis, and Prostatitis. here with     ICD-10-CM   1. Benign essential tremor  G25.0   2. Diplopia  H53.2   3. Multiple myeloma in remission [facility])  C90.01     Rafi is doing fairly well but has noted worsening of bilateral hand tremor. We have discussed most common medications used to treat tremor. He is most comfortable restarting propranolol ER 51m daily. We will consider primidone in the future. He was provided with educational material for both medications today in [facility]. He will continue regular exercise and stay well hydrated. He requests to return to see Dr SFelecia Shellingin 2-3 months. I have advised that he may in 1 year if doing well on propranolol. He is aware to call with any concerns or worsening symptoms. He verbalizes understanding and agreement with this plan.    No orders of the defined types were placed in this encounter.    No  orders of the defined types were placed in this encounter.     I spent 20 minutes with the patient. 50% of this time was spent counseling and educating patient on plan of care and medications.    ADebbora Presto FNP-C 04/30/2019, 11:53 AM [facility] [address] SMoss BeachGSolvay [place] [phone]

## 2019-06-28 ENCOUNTER — Ambulatory Visit (INDEPENDENT_AMBULATORY_CARE_PROVIDER_SITE_OTHER): Payer: Medicare Other | Admitting: Neurology

## 2019-06-28 ENCOUNTER — Encounter: Payer: Self-pay | Admitting: Neurology

## 2019-06-28 VITALS — BP 137/70 | HR 82 | Temp 97.9°F | Ht 66.0 in | Wt 150.8 lb

## 2019-06-28 DIAGNOSIS — R413 Other amnesia: Secondary | ICD-10-CM | POA: Diagnosis not present

## 2019-06-28 DIAGNOSIS — H532 Diplopia: Secondary | ICD-10-CM | POA: Diagnosis not present

## 2019-06-28 DIAGNOSIS — G250 Essential tremor: Secondary | ICD-10-CM | POA: Diagnosis not present

## 2019-06-28 DIAGNOSIS — G4700 Insomnia, unspecified: Secondary | ICD-10-CM

## 2019-06-28 DIAGNOSIS — C9001 Multiple myeloma in remission: Secondary | ICD-10-CM | POA: Diagnosis not present

## 2019-06-28 MED ORDER — PRIMIDONE 50 MG PO TABS: 50.0000 mg | ORAL_TABLET | Freq: Two times a day (BID) | ORAL | 5 refills | Status: DC
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
## 2019-07-31 ENCOUNTER — Telehealth: Payer: Self-pay | Admitting: Cardiology

## 2019-07-31 NOTE — Telephone Encounter (Signed)
Called patient 07/31/19 to schedule appointment, left message

## 2019-07-31 NOTE — Telephone Encounter (Signed)
Called patient 07/31/19 to schedule appointment, left message 

## 2019-09-04 IMAGING — CT CT ABDOMEN AND PELVIS WITHOUT CONTRAST
2 of 4 series · 14 of 46 positions shown, 16 images · non-contrast
Comparison: Chest CT 06/02/2015. PET-CT 08/30/2013 CT Abdomen and
Pelvis 10/15/2014 and earlier.

CLINICAL DATA: 72-year-old male with left lower quadrant pain for 4
days after lifting heavy object. History of hernia. History of
?amyloidosis?.

EXAM:
CT ABDOMEN AND PELVIS WITHOUT CONTRAST
TECHNIQUE: Multidetector CT imaging of the abdomen and pelvis was performed
following the standard protocol without IV contrast.

[Series 2: axial st · axial · 0.73mm/px · z∈[-522,-36]mm · 11 of 112 slices shown, 13 images]
[im 10/112  soft-tissue]
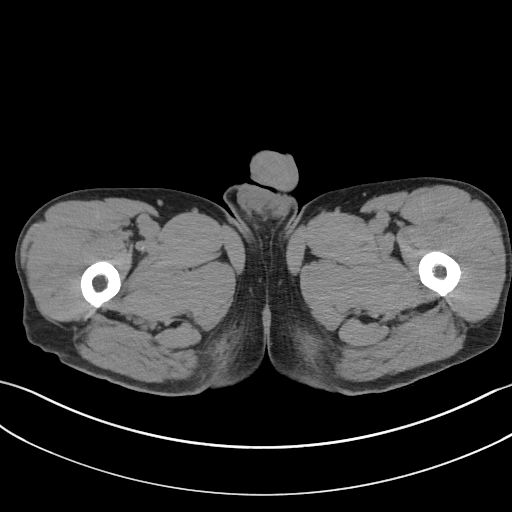
[im 10/112  bone]
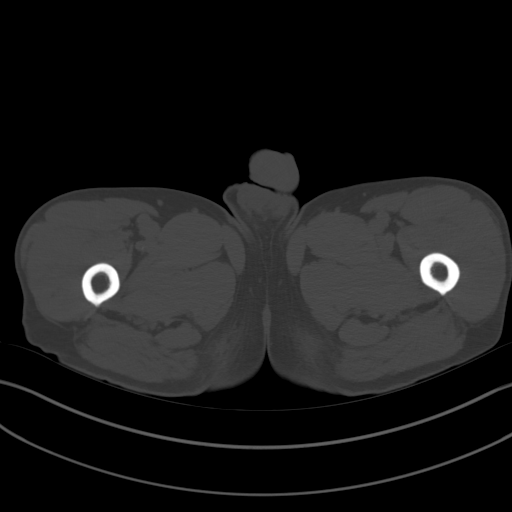
[im 20/112  soft-tissue]
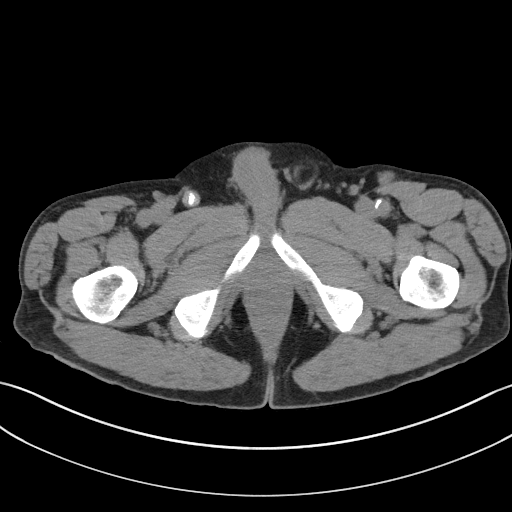
[im 29/112  soft-tissue]
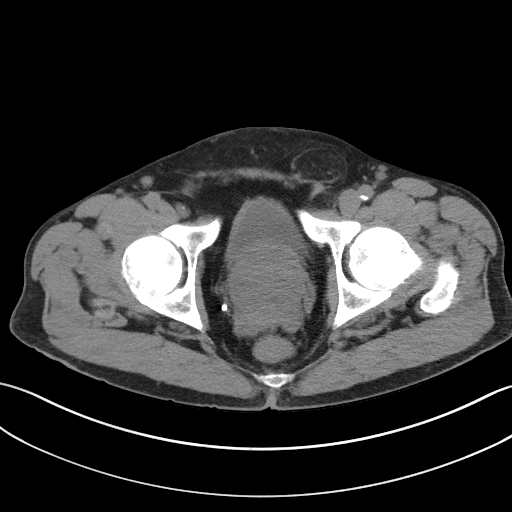
[im 39/112  soft-tissue]
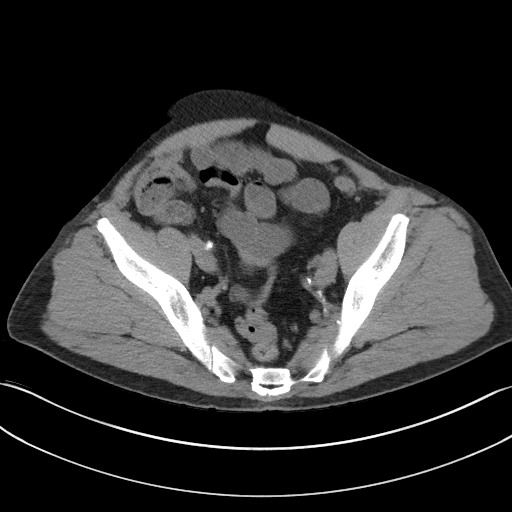
[im 49/112  soft-tissue]
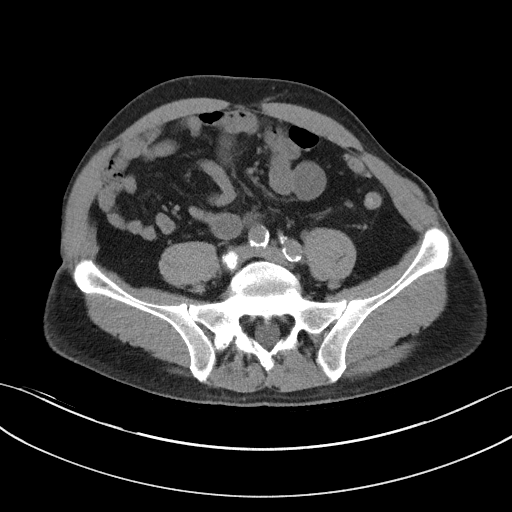
[im 58/112  soft-tissue]
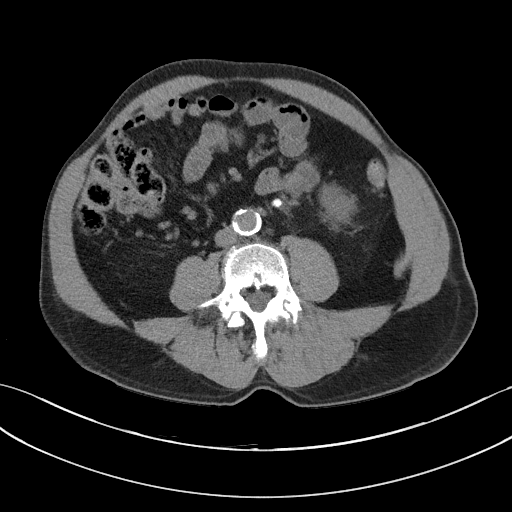
[im 68/112  soft-tissue]
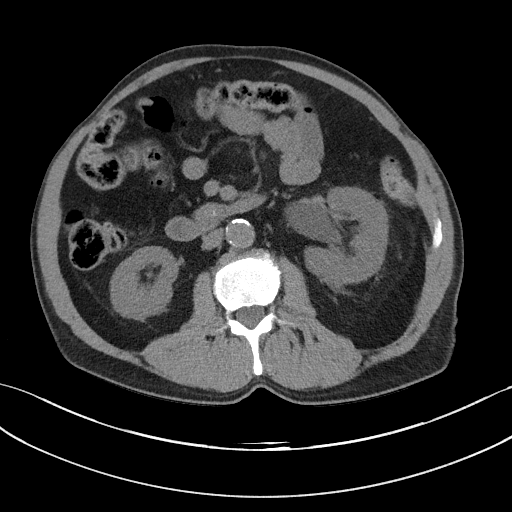
[im 78/112  soft-tissue]
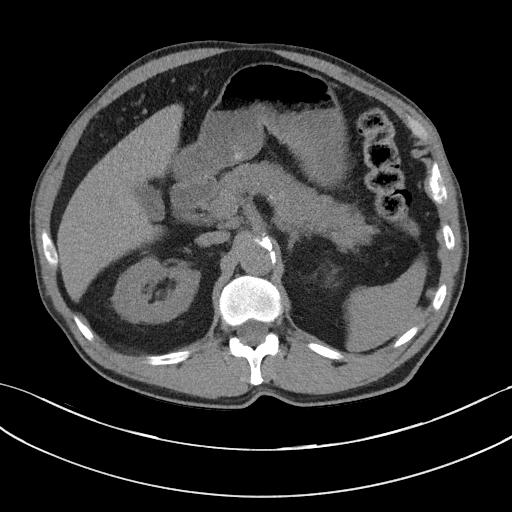
[im 87/112  soft-tissue]
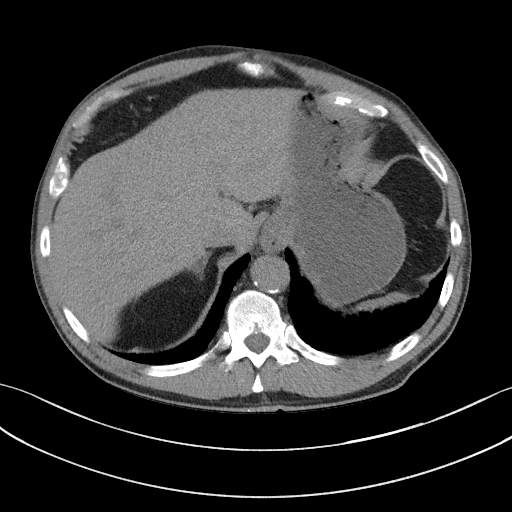
[im 87/112  bone]
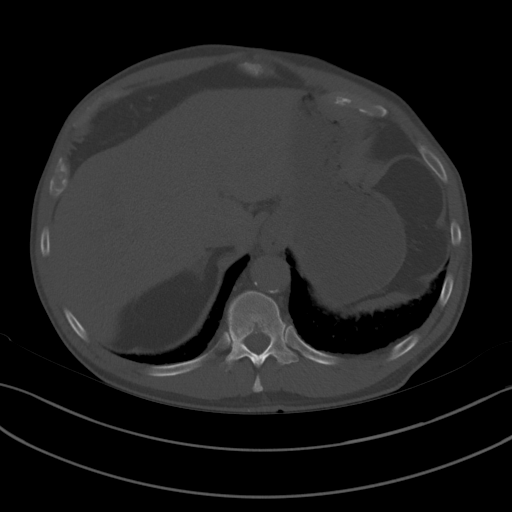
[im 97/112  soft-tissue]
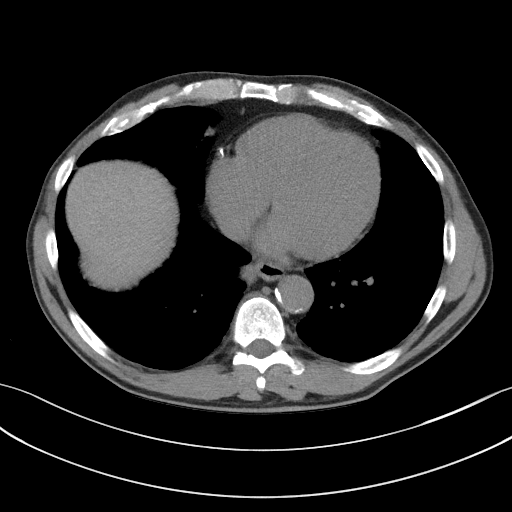
[im 107/112  soft-tissue]
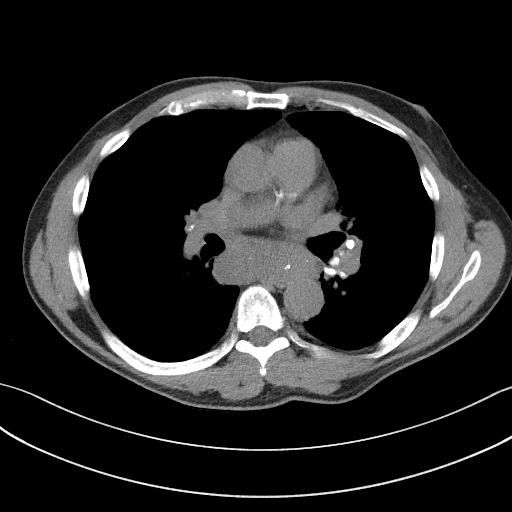

[Series 5: coronal st · coronal · 0.83mm/px · 3 of 92 slices shown]
[im 31/92  soft-tissue]
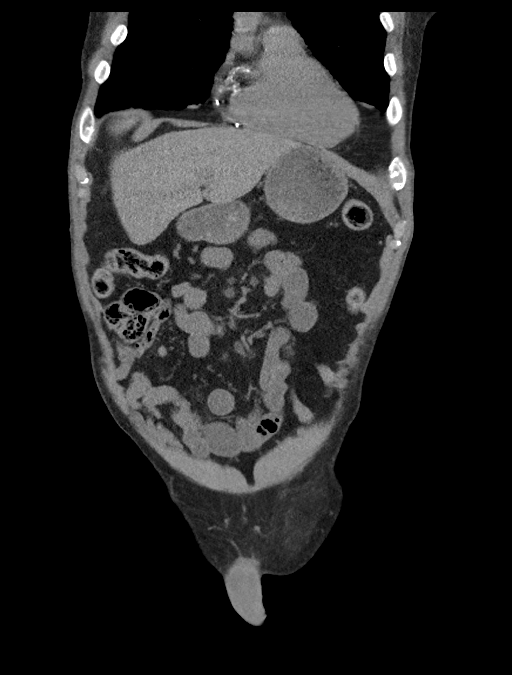
[im 41/92  soft-tissue]
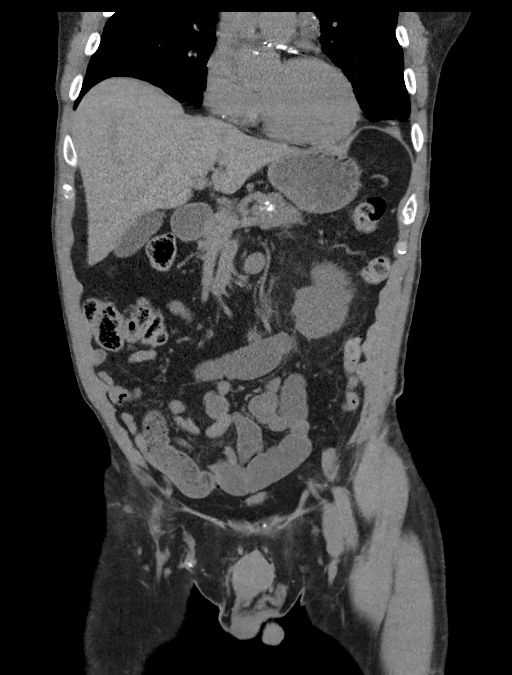
[im 51/92  soft-tissue]
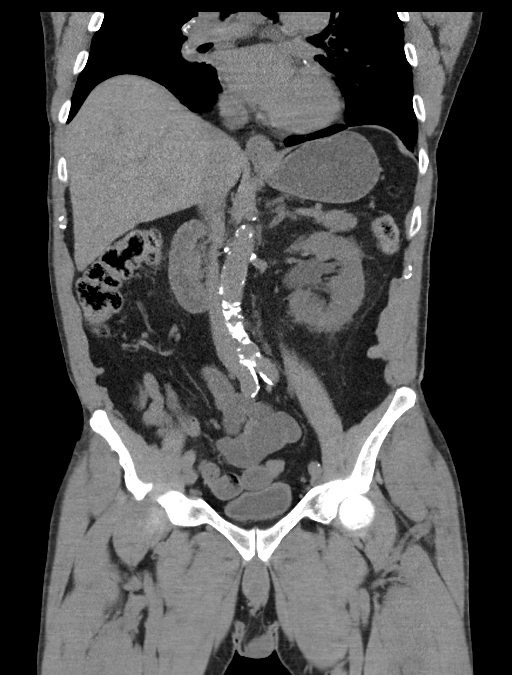

[14 of 46 positions shown; findings below may reference images not displayed]

FINDINGS: Lower chest: Chronic lingula scarring and mild bilateral pulmonary
reticulonodular density appears chronic.
Partially visible numerous partially calcified mediastinal and hilar
lymph nodes. These are mildly progressed since 6948, and furthermore
bulky subcarinal nodal tissue has progressed since 3648 and now
encompasses 35 x 78 millimeters (AP by transverse, 23 by 70
millimeters in 3648).
No pericardial or pleural effusion.

Hepatobiliary: Negative noncontrast liver and gallbladder.

Pancreas: Calcified atherosclerosis versus pancreatic parenchymal
calcification which is new since 3717, including at the uncinate on
series 2, image 46. No masslike pancreatic enlargement. No
peripancreatic inflammation.

Spleen: Negative.

Adrenals/Urinary Tract: Adrenal glands are within normal limits.
Punctate right nephrolithiasis. No right hydronephrosis. Negative
proximal right ureter.

Scattered left nephrolithiasis. The largest calculus in the lower
pole is 3-4 millimeters. Left hydronephrosis and perinephric
stranding. Left hydroureter to the level of a bulky 8 x 11
millimeter calculus which is located about 3 centimeters distal to
the left UPJ (coronal image 45). Distal to the stone the left ureter
is decompressed. However, there is gravel like layering calculi in
the urinary bladder (series 2, image 81). Decompressed bladder. No
perivesical stranding.

Stomach/Bowel: Negative rectum. Mild diverticulosis in the sigmoid
colon, mild to moderate diverticulosis in the descending colon but
no active inflammation. Negative transverse colon aside from
redundancy. Negative right colon. The cecum is on a lax mesentery.
Negative terminal ileum. Diminutive or absent appendix.

No dilated small bowel. Fluid-filled but otherwise negative stomach.
No free air, free fluid.

Vascular/Lymphatic: Extensive Aortoiliac calcified atherosclerosis.
Vascular patency is not evaluated in the absence of IV contrast.

In addition to the lymph node findings in the chest, right iliac
lymphadenopathy demonstrates progressive calcification since 3717.
There are partially calcified bilateral inguinal lymph nodes which
have mildly increased since 3717.

Reproductive: Fat containing left inguinal hernia measuring 37
millimeters diameter (series 2, image 86). No herniated bowel. No
associated inflammation.

Otherwise negative.

Other: No pelvic free fluid.

Musculoskeletal: Severe chronic lower lumbar facet arthropathy with
grade 1 anterolisthesis at L4-L5. no acute or suspicious osseous
lesion.
IMPRESSION: 1. Acute obstructive uropathy on the left with a bulky 8 x 11 mm
obstructing calculus about 3 cm distal to the left UPJ. Additional
layering small bladder calculi and left greater than right
nephrolithiasis.

2. Chronic but progressed partially calcified lymphadenopathy in the
visible chest (bulky subcarinal nodal conglomeration) and pelvis may
correspond to the history of Amyloidosis. Evidence of some
superimposed chronic interstitial lung disease and other
differential considerations include Sarcoidosis, Silicosis.

3. New pancreatic parenchymal calcifications since 3717 are also
nonspecific. These could be related to #3, but RECOMMEND follow-up
Abdomen MRI without and with contrast (Pancreatic Protocol) to
further characterize.

4. Fat containing left inguinal hernia peers non incarcerated.

5.  Aortic Atherosclerosis ([id number].J).

## 2019-09-12 DIAGNOSIS — I371 Nonrheumatic pulmonary valve insufficiency: Secondary | ICD-10-CM | POA: Insufficient documentation

## 2019-09-12 DIAGNOSIS — E785 Hyperlipidemia, unspecified: Secondary | ICD-10-CM | POA: Insufficient documentation

## 2019-09-12 DIAGNOSIS — Z7189 Other specified counseling: Secondary | ICD-10-CM | POA: Insufficient documentation

## 2019-09-12 NOTE — Progress Notes (Signed)
Cardiology Office Note   Date:  09/13/2019   ID:  William Barron, DOB 1945-12-10, MRN [id number]  PCP:  [name], MD  Cardiologist:   Minus Breeding, MD   Chief Complaint  Patient presents with  . Elevated Coronary Calcium      History of Present Illness: William Barron is a 74 y.o. male who presents for evaluation of coronary calcium.  He had an abnormal POET (Plain Old Exercise Treadmill) and significant calcium in his coronaries.  However, cath demonstrated mild CAD. He had a follow up echo last year.   He had some moderate PR but this was unchanged from previous.  He has no pulmonary HTN and normal RV/LV function IN 2019   He returns for follow up.   I was able to review [name] records.  He is getting daratumumab/dexamethasone.  He has had no complaints.  He actually feels very well. The patient denies any new symptoms such as chest discomfort, neck or arm discomfort. There has been no new shortness of breath, PND or orthopnea. There have been no reported palpitations, presyncope or syncope.  He rides a bike for 20 minutes 5 miles at a time and he has no symptoms with this.   Past Medical History:  Diagnosis Date  . Amyloidosis ([place])   . GERD (gastroesophageal reflux disease)   . Hearing loss    Has hearing aids  . History of kidney stones   . Hyperlipidemia   . Multiple myeloma ([facility])    and amylodosis   . Nephrolithiasis   . Prostatitis    acute and chronic    Past Surgical History:  Procedure Laterality Date  . APPENDECTOMY    . CARDIAC CATHETERIZATION N/A 06/13/2015   Procedure: Left Heart Cath and Coronary Angiography;  Surgeon: Jettie Booze, MD;  Location: [facility];  Service: Cardiovascular;  Laterality: N/A;  . COLONOSCOPY  04/07/00  . ELBOW SURGERY  2003   right  . EXTRACORPOREAL SHOCK WAVE LITHOTRIPSY Left 07/17/2018   Procedure: EXTRACORPOREAL SHOCK WAVE LITHOTRIPSY (ESWL);  Surgeon: [name], MD;  Location: [facility];  Service:  Urology;  Laterality: Left;  . HERNIA REPAIR    . ROTATOR CUFF REPAIR       Current Outpatient Medications  Medication Sig Dispense Refill  . ANDROGEL PUMP 20.25 MG/ACT (1.62%) GEL Apply 3 application topically every morning.     . fluconazole (DIFLUCAN) 200 MG tablet Take 200 mg by mouth daily. Takes once per week on  Sundays    . zolpidem (AMBIEN) 10 MG tablet Take 10 mg by mouth at bedtime. Patient takes prn     No current facility-administered medications for this visit.    Allergies:   Penicillins    ROS:  Please see the history of present illness.   Otherwise, review of systems are positive for none.   All other systems are reviewed and negative.    PHYSICAL EXAM: VS:  BP 132/72 (BP Location: Left Arm, Patient Position: Sitting, Cuff Size: Normal)   Pulse 66   Ht '5\' 6"'  (1.676 m)   Wt 151 lb (68.5 kg)   BMI 24.37 kg/m  , BMI Body mass index is 24.37 kg/m.  GENERAL:  Well appearing NECK:  No jugular venous distention, waveform within normal limits, carotid upstroke brisk and symmetric, no bruits, no thyromegaly LUNGS:  Clear to auscultation bilaterally CHEST:  Unremarkable HEART:  PMI not displaced or sustained,S1 and S2 within normal limits, no S3,  no S4, no clicks, no rubs, very soft brief apical systolic murmur nonradiating, no diastolic murmurs ABD:  Flat, positive bowel sounds normal in frequency in pitch, no bruits, no rebound, no guarding, no midline pulsatile mass, no hepatomegaly, no splenomegaly EXT:  2 plus pulses throughout, no edema, no cyanosis no clubbing  EKG:  EKG is  ordered today. Sinus rhythm, rate 66, axis within normal limits, intervals within normal limits, no acute ST-T wave changes.  Early repolarization changes.  No change from previous  Recent Labs: No results found for requested labs within last 8760 hours.      Wt Readings from Last 3 Encounters:  09/13/19 151 lb (68.5 kg)  06/28/19 150 lb 12.8 oz (68.4 kg)  04/30/19 154 lb 9.6 oz  (70.1 kg)      Other studies Reviewed: Additional studies/ records that were reviewed today include:   Care Everywhere Review of the above records demonstrates:   See below   ASSESSMENT AND PLAN:  ELEVATED CORONARY CALCIUM:    He had mild plaque on catheterization in the past.  He has no new symptoms.  No testing is indicated.  PULMONIC REGURGITATION:      He had some mild pulmonary regurg RV dysfunction previously.  I will follow up with an echo.   REDUCED EF:   This was normal in 2019 and will be followed up as above.  MM: He has been treated with the meds as above.  There is a little less than 10% risk of atrial fibrillation.  He is not on combination therapy that would make heart failure or risk.  He has not had any arrhythmias.  He is not having hypotension or hypertension which can happen.  We talked about this.  I will be evaluating his ejection fraction as above but do not suspect cardiac involvement.  COVID EDUCATION: He has been vaccinated.  Current medicines are reviewed at length with the patient today.  The patient does not have concerns regarding medicines.  The following changes have been made:  None  Labs/ tests ordered today include:     Orders Placed This Encounter  Procedures  . EKG 12-Lead  . ECHOCARDIOGRAM COMPLETE     Disposition:   FU with me in in one year. Ronnell Guadalajara, MD  09/13/2019 10:46 AM    Susan Moore

## 2019-09-13 ENCOUNTER — Other Ambulatory Visit: Payer: Self-pay

## 2019-09-13 ENCOUNTER — Ambulatory Visit (INDEPENDENT_AMBULATORY_CARE_PROVIDER_SITE_OTHER): Payer: Medicare Other | Admitting: Cardiology

## 2019-09-13 ENCOUNTER — Encounter: Payer: Self-pay | Admitting: Cardiology

## 2019-09-13 VITALS — BP 132/72 | HR 66 | Ht 66.0 in | Wt 151.0 lb

## 2019-09-13 DIAGNOSIS — Z7189 Other specified counseling: Secondary | ICD-10-CM

## 2019-09-13 DIAGNOSIS — R931 Abnormal findings on diagnostic imaging of heart and coronary circulation: Secondary | ICD-10-CM

## 2019-09-13 DIAGNOSIS — I371 Nonrheumatic pulmonary valve insufficiency: Secondary | ICD-10-CM

## 2019-09-13 DIAGNOSIS — E785 Hyperlipidemia, unspecified: Secondary | ICD-10-CM

## 2019-09-13 NOTE — Patient Instructions (Signed)
Medication Instructions:  CONTINUE WITH CURRENT MEDICATIONS. NO CHANGES.  *If you need a refill on your cardiac medications before your next appointment, please call your pharmacy*  Testing/Procedures: Your physician has requested that you have an echocardiogram. Echocardiography is a painless test that uses sound waves to create images of your heart. It provides your doctor with information about the size and shape of your heart and how well your heart's chambers and valves are working. This procedure takes approximately one hour. There are no restrictions for this procedure.  [address]   Follow-Up: At [facility], you and your health needs are our priority.  As part of our continuing mission to provide you with exceptional heart care, we have created designated Provider Care Teams.  These Care Teams include your primary Cardiologist (physician) and Advanced Practice Providers (APPs -  Physician Assistants and Nurse Practitioners) who all work together to provide you with the care you need, when you need it.  We recommend signing up for the patient portal called "MyChart".  Sign up information is provided on this After Visit Summary.  MyChart is used to connect with patients for Virtual Visits (Telemedicine).  Patients are able to view lab/test results, encounter notes, upcoming appointments, etc.  Non-urgent messages can be sent to your provider as well.   To learn more about what you can do with MyChart, go to [email or web address].    Your next appointment:   12 month(s)  The format for your next appointment:   In Person  Provider:   Dr. Percival Spanish

## 2019-10-01 DIAGNOSIS — G252 Other specified forms of tremor: Secondary | ICD-10-CM | POA: Insufficient documentation

## 2019-10-08 ENCOUNTER — Other Ambulatory Visit: Payer: Self-pay

## 2019-10-08 ENCOUNTER — Ambulatory Visit (HOSPITAL_COMMUNITY): Payer: Medicare Other | Attending: Internal Medicine

## 2019-10-08 DIAGNOSIS — Z7189 Other specified counseling: Secondary | ICD-10-CM | POA: Diagnosis present

## 2019-10-08 DIAGNOSIS — I371 Nonrheumatic pulmonary valve insufficiency: Secondary | ICD-10-CM | POA: Diagnosis present

## 2019-10-08 DIAGNOSIS — E785 Hyperlipidemia, unspecified: Secondary | ICD-10-CM | POA: Diagnosis present

## 2019-10-08 DIAGNOSIS — R931 Abnormal findings on diagnostic imaging of heart and coronary circulation: Secondary | ICD-10-CM | POA: Diagnosis not present

## 2019-10-08 LAB — ECHOCARDIOGRAM COMPLETE
Area-P 1/2: 1.83 cm2
P 1/2 time: 627 msec
S' Lateral: 3.2 cm

## 2019-11-06 ENCOUNTER — Ambulatory Visit: Payer: Medicare Other | Admitting: Neurology

## 2019-12-21 LAB — IFOBT (OCCULT BLOOD): IFOBT: POSITIVE

## 2020-01-21 ENCOUNTER — Encounter: Payer: Self-pay | Admitting: Internal Medicine

## 2020-02-11 DIAGNOSIS — C61 Malignant neoplasm of prostate: Secondary | ICD-10-CM | POA: Insufficient documentation

## 2020-02-18 ENCOUNTER — Encounter: Payer: Self-pay | Admitting: Radiation Oncology

## 2020-02-18 NOTE — Progress Notes (Signed)
GU Location of Tumor / Histology: prostatic adenocarcinoma  If Prostate Cancer, Gleason Score is (4 + 4) and PSA is (5.01). Prostate volume: 68.8 grams.  Biopsies of prostate (if applicable) revealed:   Past/Anticipated interventions by urology, if any: prostate biopsy, referral to Dr. Tammi Klippel for consideration of radiation therapy.  Past/Anticipated interventions by medical oncology, if any: no  Weight changes, if any: Patient has lost 5lb in one month. Wife reports patient weighs 139 lb without clothing. Today patient weighed 145 lb. Patient reports after stopping Androgel he lost all energy and drive to work out.   Bowel/Bladder complaints, if any: IPSS 25. SHIM 5. Denies dysuria or hematuria. Reports urinary leakage associated with difficulty emptying his bladder. Denies any bowel complaints.    Nausea/Vomiting, if any: denies  Pain issues, if any:  denies  SAFETY ISSUES:  Prior radiation? denies  Pacemaker/ICD? denies  Possible current pregnancy? no, male patient  Is the patient on methotrexate? denies  Current Complaints / other details:  74 year old male. Retired Automotive engineer. Married with 2 children. Former smoker. Father with history of pancreatic and prostate cancer.

## 2020-02-19 ENCOUNTER — Ambulatory Visit
Admission: RE | Admit: 2020-02-19 | Discharge: 2020-02-19 | Disposition: A | Discharge status: Home / Self Care | Payer: Medicare Other | Source: Ambulatory Visit | Attending: Radiation Oncology | Admitting: Radiation Oncology

## 2020-02-19 ENCOUNTER — Other Ambulatory Visit: Payer: Self-pay

## 2020-02-19 ENCOUNTER — Encounter: Payer: Self-pay | Admitting: Radiation Oncology

## 2020-02-19 ENCOUNTER — Encounter: Payer: Self-pay | Admitting: Medical Oncology

## 2020-02-19 VITALS — BP 120/70 | HR 63 | Temp 97.9°F | Resp 18 | Ht 66.0 in | Wt 147.5 lb

## 2020-02-19 DIAGNOSIS — Z87442 Personal history of urinary calculi: Secondary | ICD-10-CM | POA: Insufficient documentation

## 2020-02-19 DIAGNOSIS — K219 Gastro-esophageal reflux disease without esophagitis: Secondary | ICD-10-CM | POA: Diagnosis not present

## 2020-02-19 DIAGNOSIS — Z8042 Family history of malignant neoplasm of prostate: Secondary | ICD-10-CM | POA: Insufficient documentation

## 2020-02-19 DIAGNOSIS — C61 Malignant neoplasm of prostate: Secondary | ICD-10-CM | POA: Diagnosis present

## 2020-02-19 DIAGNOSIS — Z87891 Personal history of nicotine dependence: Secondary | ICD-10-CM | POA: Diagnosis not present

## 2020-02-19 DIAGNOSIS — Z85820 Personal history of malignant melanoma of skin: Secondary | ICD-10-CM | POA: Diagnosis not present

## 2020-02-19 DIAGNOSIS — Z800 Family history of malignant neoplasm of digestive organs: Secondary | ICD-10-CM | POA: Insufficient documentation

## 2020-02-19 DIAGNOSIS — E785 Hyperlipidemia, unspecified: Secondary | ICD-10-CM | POA: Diagnosis not present

## 2020-02-19 HISTORY — DX: Malignant neoplasm of prostate: C61

## 2020-02-19 HISTORY — DX: Unspecified malignant neoplasm of skin, unspecified: C44.90

## 2020-02-19 NOTE — Progress Notes (Signed)
Radiation Oncology         [phone]) [phone] ________________________________  Initial outpatient Consultation  Name: William Barron MRN: [id number]  Date: 02/19/2020  DOB: 08-Jun-1945  CC:[name], MD  [name], MD   REFERRING PHYSICIAN: [name], MD  DIAGNOSIS: 74 y.o. gentleman with Stage T2 adenocarcinoma of the prostate with Gleason score of 4+4, and PSA of 4.11.    ICD-10-CM   1. Malignant neoplasm of prostate ([place])  C61     HISTORY OF PRESENT ILLNESS: William Barron is a 74 y.o. male with a diagnosis of prostate cancer. He has a history of multiple myeloma and malignant melanoma. He has also been followed by [facility] for hypogonadism and history of nephrolithiasis, as well as BPH with moderate to severe LUTS and a history of an elevated PSA. He had been on Androgel for testosterone replacement therapy (TRT). His PSA had been stable in the 2-3 range until his PCP noted an elevation to 6 in 07/2018. It decreased to 4.48 in 08/2018 and further decreased to 3.52 by 09/2018 so the decision was to continue to monitor it.  More recently, routine lab work with [facility] in 08/2019 showed the PSA to be elevated again to 5.14. At follow up with Dr. Jeffie Pollock on 09/05/2019,  digital rectal examination was performed at that time revealing a 10 mm right apex nodule that crossed the midline. A repeat PSA in 10/2019 showed stability at 5.1 so the TRT was discontinued.  The patient proceeded to transrectal ultrasound with 12 biopsies of the prostate on 12/11/2019.  The prostate volume measured 68.8 cc.  Out of 12 core biopsies, 2 were positive.  The maximum Gleason score was 4+4, and this was seen in the right mid (with PNI). Additionally, a small focus of Gleason 3+4 was seen in the right apex lateral.  A subsequent PSA in 12/2019, off TRT, had decreased to 4.11 and he is scheduled for repeat labs at [facility] later today.  The patient reviewed the biopsy results with his  urologist and he has kindly been referred today for discussion of potential radiation treatment options.   PREVIOUS RADIATION THERAPY: No  PAST MEDICAL HISTORY:  Past Medical History:  Diagnosis Date  . Amyloidosis ([place])   . GERD (gastroesophageal reflux disease)   . Hearing loss    Has hearing aids  . History of kidney stones   . Hyperlipidemia   . Multiple myeloma ([facility])    and amylodosis   . Nephrolithiasis   . Prostate cancer ([place])   . Prostatitis    acute and chronic  . Skin cancer       PAST SURGICAL HISTORY: Past Surgical History:  Procedure Laterality Date  . APPENDECTOMY    . CARDIAC CATHETERIZATION N/A 06/13/2015   Procedure: Left Heart Cath and Coronary Angiography;  Surgeon: Jettie Booze, MD;  Location: [facility];  Service: Cardiovascular;  Laterality: N/A;  . COLONOSCOPY  04/07/00  . ELBOW SURGERY  2003   right  . EXTRACORPOREAL SHOCK WAVE LITHOTRIPSY Left 07/17/2018   Procedure: EXTRACORPOREAL SHOCK WAVE LITHOTRIPSY (ESWL);  Surgeon: [name], MD;  [facility];  Service: Urology;  Laterality: Left;  . HERNIA REPAIR    . ROTATOR CUFF REPAIR      FAMILY HISTORY:  Family History  Problem Relation Age of Onset  . Pancreatic cancer Father   . Prostate cancer Father   . Parkinsonism Mother   . CAD Brother 16  . Cancer  Paternal Grandmother   . Breast cancer Neg Hx   . Colon cancer Neg Hx     SOCIAL HISTORY:  Social History   Socioeconomic History  . Marital status: Married    Spouse name: Not on file  . Number of children: 2  . Years of education: Not on file  . Highest education level: Not on file  Occupational History  . Occupation: retired  Tobacco Use  . Smoking status: Former Smoker    Packs/day: 3.00    Years: 20.00    Pack years: 60.00    Types: Cigarettes    Quit date: 03/15/1977    Years since quitting: 42.9  . Smokeless tobacco: Never Used  . Tobacco comment: quit 35 years ago  Vaping Use  . Vaping Use: Never  used  Substance and Sexual Activity  . Alcohol use: Yes    Alcohol/week: 0.0 standard drinks    Comment: approx 1 drink/night  . Drug use: No  . Sexual activity: Yes    Birth control/protection: None  Other Topics Concern  . 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 Difficulty of Paying Living Expenses: Not on file  Food Insecurity:   . Worried About Charity fundraiser in the Last Year: Not on file  . Ran Out of Food in the Last Year: Not on file  Transportation Needs:   . Lack of Transportation (Medical): Not on file  . Lack of Transportation (Non-Medical): Not on file  Physical Activity:   . Days of Exercise per Week: Not on file  . Minutes of Exercise per Session: Not on file  Stress:   . Feeling of Stress : Not on file  Social Connections:   . Frequency of Communication with Friends and Family: Not on file  . Frequency of Social Gatherings with Friends and Family: Not on file  . Attends Religious Services: Not on file  . Active Member of Clubs or Organizations: Not on file  . Attends Archivist Meetings: Not on file  . Marital Status: Not on file  Intimate Partner Violence:   . Fear of Current or Ex-Partner: Not on file  . Emotionally Abused: Not on file  . Physically Abused: Not on file  . Sexually Abused: Not on file    ALLERGIES: Penicillins  MEDICATIONS:  Current Outpatient Medications  Medication Sig Dispense Refill  . propranolol ER (INDERAL LA) 60 MG 24 hr capsule Take by mouth.    . zolpidem (AMBIEN) 5 MG tablet      No current facility-administered medications for this encounter.    REVIEW OF SYSTEMS:  On review of systems, the patient reports that he is doing well overall. He denies any chest pain, shortness of breath, cough, fevers, chills, night sweats, unintended  weight changes. He denies any bowel disturbances, and denies abdominal pain, nausea or vomiting. He denies any new musculoskeletal or joint aches or pains. His IPSS was 25, indicating severe urinary symptoms. He reports urinary leakage associated with difficulty emptying his bladder. His SHIM was 5, indicating he has severe erectile dysfunction. A complete review of systems is obtained and is otherwise negative.    PHYSICAL EXAM:  Wt Readings from Last 3 Encounters:  02/19/20 147 lb 8 oz (66.9 kg)  09/13/19 151 lb (68.5 kg)  06/28/19 150 lb 12.8 oz (68.4 kg)   Temp Readings from Last 3 Encounters:  02/19/20 97.9 F (36.6 C) (Temporal)  06/28/19 97.9 F (36.6 C)  04/30/19 (!) 96.9 F (36.1 C) (Oral)   BP Readings from Last 3 Encounters:  02/19/20 120/70  09/13/19 132/72  06/28/19 137/70   Pulse Readings from Last 3 Encounters:  02/19/20 63  09/13/19 66  06/28/19 82   Pain Assessment Pain Score: 0-No pain/10  In general this is a well appearing Caucasian male in no acute distress. He is alert and oriented x4 and appropriate throughout the examination. HEENT reveals that the patient is normocephalic, atraumatic. EOMs are intact. PERRLA. Skin is intact without any evidence of gross lesions.  Cardiopulmonary assessment is negative for acute distress and he exhibits normal effort. The abdomen is soft, non tender, non distended. Lower extremities are negative for pretibial pitting edema, deep calf tenderness, cyanosis or clubbing.   KPS = 80- severe fatigue/malaise  100 - Normal; no complaints; no evidence of disease. 90   - Able to carry on normal activity; minor signs or symptoms of disease. 80   - Normal activity with effort; some signs or symptoms of disease. 18   - Cares for self; unable to carry on normal activity or to do active work. 60   - Requires occasional assistance, but is able to care for most of his personal needs. 50   - Requires considerable assistance and frequent  medical care. 63   - Disabled; requires special care and assistance. 47   - Severely disabled; hospital admission is indicated although death not imminent. 6   - Very sick; hospital admission necessary; active supportive treatment necessary. 10   - Moribund; fatal processes progressing rapidly. 0     - Dead  Karnofsky DA, Abelmann [place], Craver LS and Burchenal [facility] [phone]) The use of the nitrogen mustards in the palliative treatment of carcinoma: with particular reference to bronchogenic carcinoma Cancer 1 634-56  LABORATORY DATA:  Lab Results  Component Value Date   WBC 10.0 07/13/2018   HGB 16.8 07/13/2018   HCT 52.9 (H) 07/13/2018   MCV 95.5 07/13/2018   PLT 312 07/13/2018   Lab Results  Component Value Date   NA 138 07/13/2018   K 3.4 (L) 07/13/2018   CL 103 07/13/2018   CO2 24 07/13/2018   Lab Results  Component Value Date   ALT 13 06/21/2018   AST 15 06/21/2018   ALKPHOS 86 06/21/2018   BILITOT 0.4 06/21/2018     RADIOGRAPHY: No results found.    IMPRESSION/PLAN: 1. 74 y.o. gentleman with Stage T2 adenocarcinoma of the prostate with Gleason Score of 4+4, and PSA of 4.11. We discussed the patient's workup and outlined the nature of prostate cancer in this setting. He has not yet had disease staging imaging which we recommend completing prior to initiating treatment to confirm that his disease is localized. The patient's T stage, Gleason's score, and PSA put him into the high risk group. Accordingly, he is eligible for a variety of potential treatment options including prostatectomy or LT-ADT in combination with either 8 weeks of external radiation or brachytherapy boost followed by 5.5 weeks of external beam radiation. Given that his testosterone level is already at castrate level off of TRT (13.3 on most recent lab work 01/07/20 since stopping the Androgel), he will not need ADT. We discussed the available radiation techniques, and focused on the details and logistics of  delivery. The patient is  not an ideal candidate for brachytherapy boost with an IPSS score of 25, as well as a prostate volume of 68.8 cc. Therefore, we discussed and outlined the risks, benefits, short and long-term effects associated with external beam radiotherapy and compared and contrasted these with prostatectomy. We discussed the role of SpaceOAR gel in reducing the rectal toxicity associated with radiotherapy.  He and his wife were encouraged to ask questions that were answered to their stated satisfaction.  At the conclusion of our conversation, the patient is interested in moving forward with 8 weeks of external beam therapy, pending there are no unexpected findings of metastatic disease on imaging for disease staging. We will share our discussion with Dr. Jeffie Pollock and make arrangements for CT and bone scan, first available.   He has been off TRT with castrate level testosterone since 10/2019 so we will also coordinate for fiducial markers and SpaceOAR gel placement, prior to simulation, to reduce rectal toxicity from radiotherapy. The patient appears to have a good understanding of his disease and our treatment recommendations which are of curative intent and is in agreement with the stated plan.  Therefore, we will move forward with treatment planning accordingly, in anticipation of beginning IMRT in the near future pending confirmation of localized disease only on upcoming imaging.    Nicholos Johns, PA-C    Tyler Pita, MD  [facility] Direct Dial: [phone]  Fax: [phone] [email or web address]  Skype  LinkedIn   This document serves as a record of services personally performed by Tyler Pita, MD and Freeman Caldron, PA-C. It was created on their behalf by Wilburn Mylar, a trained medical scribe. The creation of this record is based on the scribe's personal observations and the provider's statements to them. This document has been checked and approved by  the attending provider.

## 2020-02-19 NOTE — Progress Notes (Signed)
Introduced myself to patient and his wife as the prostate nurse navigator and discussed my role.  He is interested in external beam radiation to treat his prostate cancer. We discussed the next steps in treatment and what to expect.  No barriers to care are identified at this time.  I gave him my business card and asked him to reach out to me with questions or concerns. He voiced understanding.

## 2020-02-20 ENCOUNTER — Encounter: Payer: Self-pay | Admitting: Licensed Clinical Social Worker

## 2020-02-20 ENCOUNTER — Other Ambulatory Visit: Payer: Self-pay | Admitting: Urology

## 2020-02-20 DIAGNOSIS — C61 Malignant neoplasm of prostate: Secondary | ICD-10-CM

## 2020-02-20 NOTE — Progress Notes (Signed)
Attempted to contact patient again per conversation earlier today. No answer. Left VM with direct contact information.   Christeen Douglas, LCSW

## 2020-02-20 NOTE — Progress Notes (Signed)
[place] Psychosocial Distress Screening Clinical Social Work  Clinical Social Work was referred by distress screening protocol.  The patient scored a 5 on the Psychosocial Distress Thermometer which indicates moderate distress. Clinical Social Worker contacted patient by phone to assess for distress and other psychosocial needs.   Patient reports feeling good about treatment plan after visit yesterday. He is currently at another appointment and asked that CSW call back later to further discuss available support programs.  [facility] DISTRESS SCREENING 02/19/2020  Screening Type Initial Screening  Distress experienced in past week (1-10) 5  Emotional problem type Adjusting to illness  Information Concerns Type Lack of info about diagnosis;Lack of info about treatment;Lack of info about complementary therapy choices;Lack of info about maintaining fitness  Physical Problem type Sleep/insomnia  Physician notified of physical symptoms Yes  Referral to clinical psychology No  Referral to clinical social work No  Referral to dietition No  Referral to financial advocate No  Referral to support programs Yes  Referral to palliative care No    Clinical Social Worker follow up needed: Yes.   If yes, follow up plan: CSW to call back later today per pt request to discuss available support programs   Mozella Rexrode E Troy Hartzog, LCSW

## 2020-02-28 ENCOUNTER — Telehealth: Payer: Self-pay | Admitting: Radiation Oncology

## 2020-02-28 ENCOUNTER — Other Ambulatory Visit: Payer: Self-pay | Admitting: Urology

## 2020-02-28 DIAGNOSIS — C61 Malignant neoplasm of prostate: Secondary | ICD-10-CM

## 2020-02-28 NOTE — Telephone Encounter (Signed)
Received voicemail message from patient requesting return call about procedures scheduled for 03/05/20 and how they might effect his travel plans. Phoned patient back at the number provided and spoke with both he and his wife over speaker phone. Explained the only appointments I see scheduled for December 22 is a bone and CT scan. Explained it doesn't appear his fiducial marker and spaceoar placement have been scheduled yet. Explained the scans should not create any symptoms that would prevent him from being out of town 3-4 night to celebrate christmas. Provided them with the number for [facility] to confirm. Both verbalized understanding and appreciation for the callback.

## 2020-03-05 ENCOUNTER — Ambulatory Visit (HOSPITAL_COMMUNITY)
Admission: RE | Admit: 2020-03-05 | Discharge: 2020-03-05 | Disposition: A | Discharge status: Home / Self Care | Payer: Medicare Other | Source: Ambulatory Visit | Attending: Urology | Admitting: Urology

## 2020-03-05 ENCOUNTER — Encounter (HOSPITAL_COMMUNITY)
Admission: RE | Admit: 2020-03-05 | Discharge: 2020-03-05 | Disposition: A | Discharge status: Home / Self Care | Payer: Medicare Other | Source: Ambulatory Visit | Attending: Urology | Admitting: Urology

## 2020-03-05 ENCOUNTER — Other Ambulatory Visit: Payer: Self-pay

## 2020-03-05 ENCOUNTER — Other Ambulatory Visit: Payer: Self-pay | Admitting: Radiation Oncology

## 2020-03-05 ENCOUNTER — Encounter (HOSPITAL_COMMUNITY): Payer: Self-pay

## 2020-03-05 ENCOUNTER — Ambulatory Visit
Admission: RE | Admit: 2020-03-05 | Discharge: 2020-03-05 | Disposition: A | Discharge status: Home / Self Care | Payer: Medicare Other | Source: Ambulatory Visit | Attending: Urology | Admitting: Urology

## 2020-03-05 DIAGNOSIS — C61 Malignant neoplasm of prostate: Secondary | ICD-10-CM

## 2020-03-05 LAB — BUN & CREATININE (CHCC)
BUN: 17 mg/dL (ref 8–23)
Creatinine: 0.98 mg/dL (ref 0.61–1.24)
GFR, Estimated: >60 mL/min

## 2020-03-05 MED ORDER — TECHNETIUM TC 99M MEDRONATE IV KIT
20.6000 | PACK | Freq: Once | INTRAVENOUS | Status: AC
  Administered 2020-03-05: 20.6 via INTRAVENOUS

## 2020-03-05 MED ORDER — IOHEXOL 300 MG/ML  SOLN
100.0000 mL | Freq: Once | INTRAMUSCULAR | Status: AC | PRN
  Administered 2020-03-05: 100 mL via INTRAVENOUS

## 2020-03-10 ENCOUNTER — Other Ambulatory Visit: Payer: Self-pay | Admitting: Urology

## 2020-03-10 DIAGNOSIS — C61 Malignant neoplasm of prostate: Secondary | ICD-10-CM

## 2020-03-17 ENCOUNTER — Telehealth: Payer: Self-pay | Admitting: Radiation Oncology

## 2020-03-17 ENCOUNTER — Telehealth: Payer: Self-pay | Admitting: *Deleted

## 2020-03-17 NOTE — Telephone Encounter (Signed)
  Phoned patient as requested by Marcello Fennel, PA-C. Patient hard of hearing and placed the call on speaker so his wife, Lurena Joiner, could listen in. Explained the following to the patient as requested by Ashlyn: his scans overall look good and do not show any obvious evidence of metastatic prostate cancer. On the bone scan, there is a small focus of increased radiotracer uptake in the right posterior tenth rib. This finding, as an isolated metastasis would be unusual but does warrant rib detail images to further evaluate. Bony uptake otherwise unremarkable. I will order rib xrays and [facility] will be in contact with him to schedule this week and we will call him with the results as soon as available.   Patient verbalized understanding of all reviewed and appreciation for the call. Patient reports a "deep cough" x2 months that is productive without hemoptysis. Patient denies any other respiratory symptoms or fever. Encouraged patient to follow up with PCP. Patient verbalized understanding.

## 2020-03-17 NOTE — Telephone Encounter (Signed)
-----   Message from [place], [place] sent at 03/10/2020  3:47 PM EST ----- Regarding: Disease staging imaging negative for obvious metastatic disease William Barron, Please call William Barron to let him know that his scans overall look good and do not show any obvious evidence of metastatic prostate cancer. On the bone scan, there is a small focus of increased radiotracer uptake in the right posterior tenth rib. This finding, as an isolated metastasis would be unusual but does warrant rib detail images to further evaluate. Bony uptake otherwise unremarkable.  I will order rib xrays and William Barron will be in contact with him to schedule this week and we will call him with the results as soon as available. Thank you! -William Barron

## 2020-03-17 NOTE — Telephone Encounter (Signed)
Called patient to inform of sim and MRI for 03-28-20, spoke with patient and his wife and they are aware of these appts.

## 2020-03-18 ENCOUNTER — Telehealth: Payer: Self-pay | Admitting: *Deleted

## 2020-03-18 NOTE — Telephone Encounter (Signed)
Called patient to inform that he can go anytime he wants for the DG Ribs and Right Chest X-ray, spoke with patient and told him that he can go anytime he wants to get this done @ [facility], patient verified understanding this

## 2020-03-19 ENCOUNTER — Ambulatory Visit (HOSPITAL_COMMUNITY)
Admission: RE | Admit: 2020-03-19 | Discharge: 2020-03-19 | Disposition: A | Discharge status: Home / Self Care | Payer: Medicare Other | Source: Ambulatory Visit | Attending: Urology | Admitting: Urology

## 2020-03-19 ENCOUNTER — Other Ambulatory Visit: Payer: Self-pay

## 2020-03-19 DIAGNOSIS — C61 Malignant neoplasm of prostate: Secondary | ICD-10-CM | POA: Insufficient documentation

## 2020-03-20 ENCOUNTER — Telehealth: Payer: Self-pay | Admitting: Radiation Oncology

## 2020-03-20 NOTE — Telephone Encounter (Signed)
Phoned patient's home. No answer. Phoned wife's cell phone. Spoke with wife and explained her husband's rib xray did not show any evidence of metastatic disease to his bones. Wife verbalized understanding and excitement to receive good news. Then, Lurena Joiner put me on speaker phone and I repeated the same good news to the patient. He verbalized understanding. Confirmed CT simulation appointment for 03/28/2020 at 1330. Patient verbalizes his understanding that Dr. Annabell Howells recently told him to resume his androgel. Patient expressed his confusion about this directive. Strongly encouraged patient to circle back with Dr. Annabell Howells reference his concerns about androgel. Patient verbalized understanding.

## 2020-03-21 ENCOUNTER — Ambulatory Visit: Payer: Medicare Other | Admitting: Radiation Oncology

## 2020-03-27 ENCOUNTER — Telehealth: Payer: Self-pay | Admitting: *Deleted

## 2020-03-27 NOTE — Telephone Encounter (Signed)
Called patient to remind of sim and MRI for 03-28-20, spoke with patient and he is aware of these appts.

## 2020-03-28 ENCOUNTER — Other Ambulatory Visit: Payer: Self-pay

## 2020-03-28 ENCOUNTER — Ambulatory Visit (HOSPITAL_COMMUNITY)
Admission: RE | Admit: 2020-03-28 | Discharge: 2020-03-28 | Disposition: A | Discharge status: Home / Self Care | Payer: Medicare Other | Source: Ambulatory Visit | Attending: Urology | Admitting: Urology

## 2020-03-28 ENCOUNTER — Ambulatory Visit
Admission: RE | Admit: 2020-03-28 | Discharge: 2020-03-28 | Disposition: A | Discharge status: Home / Self Care | Payer: Medicare Other | Source: Ambulatory Visit | Attending: Radiation Oncology | Admitting: Radiation Oncology

## 2020-03-28 ENCOUNTER — Encounter: Payer: Self-pay | Admitting: Medical Oncology

## 2020-03-28 DIAGNOSIS — C61 Malignant neoplasm of prostate: Secondary | ICD-10-CM

## 2020-03-28 NOTE — Progress Notes (Signed)
  Radiation Oncology         [phone]) [phone] ________________________________  Name: William Barron MRN: [id number]  Date: 03/28/2020  DOB: 1945-12-22  SIMULATION AND TREATMENT PLANNING NOTE    ICD-10-CM   1. Malignant neoplasm of prostate ([place])  C61     DIAGNOSIS:  75 y.o. gentleman with Stage T2 adenocarcinoma of the prostate with Gleason score of 4+4, and PSA of 4.11  NARRATIVE:  The patient was brought to the [facility].  Identity was confirmed.  All relevant records and images related to the planned course of therapy were reviewed.  The patient freely provided informed written consent to proceed with treatment after reviewing the details related to the planned course of therapy. The consent form was witnessed and verified by the simulation staff.  Then, the patient was set-up in a stable reproducible supine position for radiation therapy.  A vacuum lock pillow device was custom fabricated to position his legs in a reproducible immobilized position.  Then, I performed a urethrogram under sterile conditions to identify the prostatic bed.  CT images were obtained.  Surface markings were placed.  The CT images were loaded into the planning software.  Then the prostate bed target, pelvic lymph node target and avoidance structures including the rectum, bladder, bowel and hips were contoured.  Treatment planning then occurred.  The radiation prescription was entered and confirmed.  A total of one complex treatment devices were fabricated. I have requested : Intensity Modulated Radiotherapy (IMRT) is medically necessary for this case for the following reason:  Rectal sparing.Marland Kitchen  PLAN:  The patient will receive 45 Gy in 25 fractions of 1.8 Gy, followed by a boost to the prostate to a total dose of 75 Gy with 15 additional fractions of 2 Gy.   ________________________________  Sheral Apley Tammi Klippel, M.D.

## 2020-04-01 DIAGNOSIS — C61 Malignant neoplasm of prostate: Secondary | ICD-10-CM | POA: Diagnosis not present

## 2020-04-08 ENCOUNTER — Ambulatory Visit
Admission: RE | Admit: 2020-04-08 | Discharge: 2020-04-08 | Disposition: A | Discharge status: Home / Self Care | Payer: Medicare Other | Source: Ambulatory Visit | Attending: Radiation Oncology | Admitting: Radiation Oncology

## 2020-04-08 ENCOUNTER — Other Ambulatory Visit: Payer: Self-pay

## 2020-04-08 DIAGNOSIS — C61 Malignant neoplasm of prostate: Secondary | ICD-10-CM | POA: Diagnosis not present

## 2020-04-09 ENCOUNTER — Ambulatory Visit
Admission: RE | Admit: 2020-04-09 | Discharge: 2020-04-09 | Disposition: A | Discharge status: Home / Self Care | Payer: Medicare Other | Source: Ambulatory Visit | Attending: Radiation Oncology | Admitting: Radiation Oncology

## 2020-04-09 ENCOUNTER — Other Ambulatory Visit: Payer: Self-pay

## 2020-04-09 DIAGNOSIS — C61 Malignant neoplasm of prostate: Secondary | ICD-10-CM | POA: Diagnosis not present

## 2020-04-10 ENCOUNTER — Ambulatory Visit
Admission: RE | Admit: 2020-04-10 | Discharge: 2020-04-10 | Disposition: A | Discharge status: Home / Self Care | Payer: Medicare Other | Source: Ambulatory Visit | Attending: Radiation Oncology | Admitting: Radiation Oncology

## 2020-04-10 ENCOUNTER — Other Ambulatory Visit: Payer: Self-pay

## 2020-04-10 DIAGNOSIS — C61 Malignant neoplasm of prostate: Secondary | ICD-10-CM | POA: Diagnosis not present

## 2020-04-11 ENCOUNTER — Other Ambulatory Visit: Payer: Self-pay

## 2020-04-11 ENCOUNTER — Ambulatory Visit
Admission: RE | Admit: 2020-04-11 | Discharge: 2020-04-11 | Disposition: A | Discharge status: Home / Self Care | Payer: Medicare Other | Source: Ambulatory Visit | Attending: Radiation Oncology | Admitting: Radiation Oncology

## 2020-04-11 DIAGNOSIS — C61 Malignant neoplasm of prostate: Secondary | ICD-10-CM | POA: Diagnosis not present

## 2020-04-11 NOTE — Progress Notes (Signed)
Complete post sim education with patient today. Oriented patient to staff and routine of the clinic. Provided patient with RADIATION THERAPY AND YOU handbook then, reviewed pertinent information. Educated patient reference potential side effects and management such as fatigue, diarrhea and urinary/bladder changes. Answered all patient questions to the best of my ability. Provided patient with my business card and encouraged him to call with future needs. Patient verbalized understanding of all reviewed.  

## 2020-04-14 ENCOUNTER — Ambulatory Visit
Admission: RE | Admit: 2020-04-14 | Discharge: 2020-04-14 | Disposition: A | Discharge status: Home / Self Care | Payer: Medicare Other | Source: Ambulatory Visit | Attending: Radiation Oncology | Admitting: Radiation Oncology

## 2020-04-14 DIAGNOSIS — C61 Malignant neoplasm of prostate: Secondary | ICD-10-CM | POA: Diagnosis not present

## 2020-04-15 ENCOUNTER — Ambulatory Visit
Admission: RE | Admit: 2020-04-15 | Discharge: 2020-04-15 | Disposition: A | Discharge status: Home / Self Care | Payer: Medicare Other | Source: Ambulatory Visit | Attending: Radiation Oncology | Admitting: Radiation Oncology

## 2020-04-15 ENCOUNTER — Ambulatory Visit: Payer: Medicare Other | Admitting: Radiation Oncology

## 2020-04-15 DIAGNOSIS — C61 Malignant neoplasm of prostate: Secondary | ICD-10-CM | POA: Diagnosis present

## 2020-04-16 ENCOUNTER — Ambulatory Visit
Admission: RE | Admit: 2020-04-16 | Discharge: 2020-04-16 | Disposition: A | Discharge status: Home / Self Care | Payer: Medicare Other | Source: Ambulatory Visit | Attending: Radiation Oncology | Admitting: Radiation Oncology

## 2020-04-16 DIAGNOSIS — C61 Malignant neoplasm of prostate: Secondary | ICD-10-CM | POA: Diagnosis not present

## 2020-04-17 ENCOUNTER — Ambulatory Visit
Admission: RE | Admit: 2020-04-17 | Discharge: 2020-04-17 | Disposition: A | Discharge status: Home / Self Care | Payer: Medicare Other | Source: Ambulatory Visit | Attending: Radiation Oncology | Admitting: Radiation Oncology

## 2020-04-17 DIAGNOSIS — C61 Malignant neoplasm of prostate: Secondary | ICD-10-CM | POA: Diagnosis not present

## 2020-04-18 ENCOUNTER — Ambulatory Visit
Admission: RE | Admit: 2020-04-18 | Discharge: 2020-04-18 | Disposition: A | Discharge status: Home / Self Care | Payer: Medicare Other | Source: Ambulatory Visit | Attending: Radiation Oncology | Admitting: Radiation Oncology

## 2020-04-18 ENCOUNTER — Other Ambulatory Visit: Payer: Self-pay

## 2020-04-18 DIAGNOSIS — C61 Malignant neoplasm of prostate: Secondary | ICD-10-CM | POA: Diagnosis not present

## 2020-04-21 ENCOUNTER — Ambulatory Visit
Admission: RE | Admit: 2020-04-21 | Discharge: 2020-04-21 | Disposition: A | Discharge status: Home / Self Care | Payer: Medicare Other | Source: Ambulatory Visit | Attending: Radiation Oncology | Admitting: Radiation Oncology

## 2020-04-21 DIAGNOSIS — C61 Malignant neoplasm of prostate: Secondary | ICD-10-CM | POA: Diagnosis not present

## 2020-04-22 ENCOUNTER — Ambulatory Visit
Admission: RE | Admit: 2020-04-22 | Discharge: 2020-04-22 | Disposition: A | Discharge status: Home / Self Care | Payer: Medicare Other | Source: Ambulatory Visit | Attending: Radiation Oncology | Admitting: Radiation Oncology

## 2020-04-22 DIAGNOSIS — C61 Malignant neoplasm of prostate: Secondary | ICD-10-CM | POA: Diagnosis not present

## 2020-04-23 ENCOUNTER — Ambulatory Visit
Admission: RE | Admit: 2020-04-23 | Discharge: 2020-04-23 | Disposition: A | Discharge status: Home / Self Care | Payer: Medicare Other | Source: Ambulatory Visit | Attending: Radiation Oncology | Admitting: Radiation Oncology

## 2020-04-23 DIAGNOSIS — C61 Malignant neoplasm of prostate: Secondary | ICD-10-CM | POA: Diagnosis not present

## 2020-04-24 ENCOUNTER — Ambulatory Visit
Admission: RE | Admit: 2020-04-24 | Discharge: 2020-04-24 | Disposition: A | Discharge status: Home / Self Care | Payer: Medicare Other | Source: Ambulatory Visit | Attending: Radiation Oncology | Admitting: Radiation Oncology

## 2020-04-24 DIAGNOSIS — C61 Malignant neoplasm of prostate: Secondary | ICD-10-CM | POA: Diagnosis not present

## 2020-04-25 ENCOUNTER — Ambulatory Visit
Admission: RE | Admit: 2020-04-25 | Discharge: 2020-04-25 | Disposition: A | Discharge status: Home / Self Care | Payer: Medicare Other | Source: Ambulatory Visit | Attending: Radiation Oncology | Admitting: Radiation Oncology

## 2020-04-25 ENCOUNTER — Other Ambulatory Visit: Payer: Self-pay

## 2020-04-25 DIAGNOSIS — C61 Malignant neoplasm of prostate: Secondary | ICD-10-CM | POA: Diagnosis not present

## 2020-04-28 ENCOUNTER — Ambulatory Visit
Admission: RE | Admit: 2020-04-28 | Discharge: 2020-04-28 | Disposition: A | Discharge status: Home / Self Care | Payer: Medicare Other | Source: Ambulatory Visit | Attending: Radiation Oncology | Admitting: Radiation Oncology

## 2020-04-28 ENCOUNTER — Other Ambulatory Visit: Payer: Self-pay

## 2020-04-28 DIAGNOSIS — C61 Malignant neoplasm of prostate: Secondary | ICD-10-CM | POA: Diagnosis not present

## 2020-04-29 ENCOUNTER — Ambulatory Visit
Admission: RE | Admit: 2020-04-29 | Discharge: 2020-04-29 | Disposition: A | Discharge status: Home / Self Care | Payer: Medicare Other | Source: Ambulatory Visit | Attending: Radiation Oncology | Admitting: Radiation Oncology

## 2020-04-29 ENCOUNTER — Other Ambulatory Visit: Payer: Self-pay

## 2020-04-29 DIAGNOSIS — C61 Malignant neoplasm of prostate: Secondary | ICD-10-CM | POA: Diagnosis not present

## 2020-04-30 ENCOUNTER — Other Ambulatory Visit: Payer: Self-pay

## 2020-04-30 ENCOUNTER — Ambulatory Visit
Admission: RE | Admit: 2020-04-30 | Discharge: 2020-04-30 | Disposition: A | Discharge status: Home / Self Care | Payer: Medicare Other | Source: Ambulatory Visit | Attending: Radiation Oncology | Admitting: Radiation Oncology

## 2020-04-30 DIAGNOSIS — C61 Malignant neoplasm of prostate: Secondary | ICD-10-CM | POA: Diagnosis not present

## 2020-05-01 ENCOUNTER — Ambulatory Visit
Admission: RE | Admit: 2020-05-01 | Discharge: 2020-05-01 | Disposition: A | Discharge status: Home / Self Care | Payer: Medicare Other | Source: Ambulatory Visit | Attending: Radiation Oncology | Admitting: Radiation Oncology

## 2020-05-01 ENCOUNTER — Other Ambulatory Visit: Payer: Self-pay

## 2020-05-01 DIAGNOSIS — C61 Malignant neoplasm of prostate: Secondary | ICD-10-CM | POA: Diagnosis not present

## 2020-05-02 ENCOUNTER — Ambulatory Visit
Admission: RE | Admit: 2020-05-02 | Discharge: 2020-05-02 | Disposition: A | Discharge status: Home / Self Care | Payer: Medicare Other | Source: Ambulatory Visit | Attending: Radiation Oncology | Admitting: Radiation Oncology

## 2020-05-02 ENCOUNTER — Other Ambulatory Visit: Payer: Self-pay

## 2020-05-02 DIAGNOSIS — C61 Malignant neoplasm of prostate: Secondary | ICD-10-CM | POA: Diagnosis not present

## 2020-05-05 ENCOUNTER — Ambulatory Visit
Admission: RE | Admit: 2020-05-05 | Discharge: 2020-05-05 | Disposition: A | Discharge status: Home / Self Care | Payer: Medicare Other | Source: Ambulatory Visit | Attending: Radiation Oncology | Admitting: Radiation Oncology

## 2020-05-05 ENCOUNTER — Other Ambulatory Visit: Payer: Self-pay

## 2020-05-05 DIAGNOSIS — C61 Malignant neoplasm of prostate: Secondary | ICD-10-CM | POA: Diagnosis not present

## 2020-05-06 ENCOUNTER — Emergency Department (HOSPITAL_BASED_OUTPATIENT_CLINIC_OR_DEPARTMENT_OTHER): Payer: Medicare Other

## 2020-05-06 ENCOUNTER — Other Ambulatory Visit: Payer: Self-pay

## 2020-05-06 ENCOUNTER — Ambulatory Visit
Admission: RE | Admit: 2020-05-06 | Discharge: 2020-05-06 | Disposition: A | Discharge status: Home / Self Care | Payer: Medicare Other | Source: Ambulatory Visit | Attending: Radiation Oncology | Admitting: Radiation Oncology

## 2020-05-06 ENCOUNTER — Encounter (HOSPITAL_BASED_OUTPATIENT_CLINIC_OR_DEPARTMENT_OTHER): Payer: Self-pay | Admitting: Emergency Medicine

## 2020-05-06 ENCOUNTER — Emergency Department (HOSPITAL_BASED_OUTPATIENT_CLINIC_OR_DEPARTMENT_OTHER)
Admission: EM | Admit: 2020-05-06 | Discharge: 2020-05-06 | Disposition: A | Discharge status: Home / Self Care | Payer: Medicare Other | Source: Home / Self Care | Attending: Emergency Medicine | Admitting: Emergency Medicine

## 2020-05-06 DIAGNOSIS — M25512 Pain in left shoulder: Secondary | ICD-10-CM | POA: Insufficient documentation

## 2020-05-06 DIAGNOSIS — Z85828 Personal history of other malignant neoplasm of skin: Secondary | ICD-10-CM | POA: Insufficient documentation

## 2020-05-06 DIAGNOSIS — Z8616 Personal history of COVID-19: Secondary | ICD-10-CM | POA: Insufficient documentation

## 2020-05-06 DIAGNOSIS — Z8546 Personal history of malignant neoplasm of prostate: Secondary | ICD-10-CM | POA: Insufficient documentation

## 2020-05-06 DIAGNOSIS — Z87891 Personal history of nicotine dependence: Secondary | ICD-10-CM | POA: Insufficient documentation

## 2020-05-06 DIAGNOSIS — R0789 Other chest pain: Secondary | ICD-10-CM

## 2020-05-06 DIAGNOSIS — I10 Essential (primary) hypertension: Secondary | ICD-10-CM | POA: Insufficient documentation

## 2020-05-06 DIAGNOSIS — C61 Malignant neoplasm of prostate: Secondary | ICD-10-CM | POA: Diagnosis not present

## 2020-05-06 LAB — CBC WITH DIFFERENTIAL/PLATELET
Abs Immature Granulocytes: 0.03 10*3/uL (ref 0.00–0.07)
Basophils Absolute: 0.1 10*3/uL (ref 0.0–0.1)
Basophils Relative: 1 %
Eosinophils Absolute: 0.3 10*3/uL (ref 0.0–0.5)
Eosinophils Relative: 6 %
HCT: 39.9 % (ref 39.0–52.0)
Hemoglobin: 12.8 g/dL — ABNORMAL LOW (ref 13.0–17.0)
Immature Granulocytes: 1 %
Lymphocytes Relative: 20 %
Lymphs Abs: 0.8 10*3/uL (ref 0.7–4.0)
MCH: 30.7 pg (ref 26.0–34.0)
MCHC: 32.1 g/dL (ref 30.0–36.0)
MCV: 95.7 fL (ref 80.0–100.0)
Monocytes Absolute: 0.5 10*3/uL (ref 0.1–1.0)
Monocytes Relative: 14 %
Neutro Abs: 2.3 10*3/uL (ref 1.7–7.7)
Neutrophils Relative %: 58 %
Platelets: 333 10*3/uL (ref 150–400)
RBC: 4.17 MIL/uL — ABNORMAL LOW (ref 4.22–5.81)
RDW: 15.4 % (ref 11.5–15.5)
WBC: 4 10*3/uL (ref 4.0–10.5)
nRBC: 0 % (ref 0.0–0.2)

## 2020-05-06 LAB — BASIC METABOLIC PANEL
Anion gap: 9 (ref 5–15)
BUN: 9 mg/dL (ref 8–23)
CO2: 26 mmol/L (ref 22–32)
Calcium: 8.3 mg/dL — ABNORMAL LOW (ref 8.9–10.3)
Chloride: 104 mmol/L (ref 98–111)
Creatinine, Ser: 0.93 mg/dL (ref 0.61–1.24)
GFR, Estimated: >60 mL/min (ref >60)
Glucose, Bld: 144 mg/dL — ABNORMAL HIGH (ref 70–99)
Potassium: 3.5 mmol/L (ref 3.5–5.1)
Sodium: 139 mmol/L (ref 135–145)

## 2020-05-06 LAB — TROPONIN I (HIGH SENSITIVITY)
Troponin I (High Sensitivity): 43 ng/L — ABNORMAL HIGH (ref <18)
Troponin I (High Sensitivity): 50 ng/L — ABNORMAL HIGH (ref <18)

## 2020-05-06 LAB — D-DIMER, QUANTITATIVE: D-Dimer, Quant: 0.62 ug/mL-FEU — ABNORMAL HIGH (ref 0.00–0.50)

## 2020-05-06 MED ORDER — IOHEXOL 350 MG/ML SOLN
100.0000 mL | Freq: Once | INTRAVENOUS | Status: AC | PRN
  Administered 2020-05-06: 100 mL via INTRAVENOUS

## 2020-05-06 MED ORDER — CYCLOBENZAPRINE HCL 5 MG PO TABS: 5.0000 mg | ORAL_TABLET | Freq: Two times a day (BID) | ORAL | 0 refills | Status: DC | PRN

## 2020-05-06 NOTE — ED Notes (Signed)
Patient transported to CT 

## 2020-05-06 NOTE — ED Triage Notes (Addendum)
Reports left scapula pain that radiates into his left chest.  Denies any SOB.  Reports pain comes and goes.  Currently rating at 0/10.  Also reports having radiation today for prostate cancer.

## 2020-05-06 NOTE — Discharge Instructions (Addendum)
Seen here for chest pain and left scapula pain.  Lab work and imaging look reassuring.  Given you a prescription for a muscle relaxer please take as prescribed, be aware this medication can make you drowsy do not consume alcohol or operate heavy machinery while taking this medication, recommend take this at nighttime.  You may take over-the-counter pain medications like ibuprofen and or Tylenol every 6 hours as needed please follow dosing back of bottle.  Follow-up with your oncologist for further evaluation.  Come back to the emergency department if you develop chest pain, shortness of breath, severe abdominal pain, uncontrolled nausea, vomiting, diarrhea.

## 2020-05-06 NOTE — ED Provider Notes (Signed)
[facility] EMERGENCY DEPARTMENT Provider Note   CSN: [id number] Arrival date & time: 05/06/20  1412     History Chief Complaint  Patient presents with  . Chest Pain  . Back Pain    William Barron is a 75 y.o. male.  HPI   Patient with significant medical history of prostate cancer, amyloidosis, multiple myeloma, currently on chemotherapy presents with chief complaint of left-sided scapular pain and chest pain.  Patient endorses that pain started approximately 4 to 5 days ago, states it comes and goes, lasts for a few hours and then spontaneous goes away on its own.  He describes a sharp sensation that is not associated with shortness of breath, nausea, vomiting, lightheadedness or dizziness, denies exertion making the pain worse.  He has never experienced this in the past, denies heavy lifting, chronic back pain, has no cardiac history, no history of PEs or DVTs, does endorse that he is taking androgen gel.  Patient states that he has been trying to place NSAID gel on it without any relief, denies any alleviating factors.  Patient states currently has no pain at this time.  Patient denies headaches, fevers, chills, shortness of breath, abdominal pain, nausea, vomiting, diarrhea, pedal edema.  Past Medical History:  Diagnosis Date  . Amyloidosis ([place])   . GERD (gastroesophageal reflux disease)   . Hearing loss    Has hearing aids  . History of kidney stones   . Hyperlipidemia   . Multiple myeloma ([facility])    and amylodosis   . Nephrolithiasis   . Prostate cancer ([place])   . Prostatitis    acute and chronic  . Skin cancer     Patient Active Problem List   Diagnosis Date Noted  . Malignant neoplasm of prostate ([place]) 02/11/2020  . Orthostatic tremor 10/01/2019  . Educated about COVID-19 virus infection 09/12/2019  . Dyslipidemia 09/12/2019  . Nonrheumatic pulmonary valve insufficiency 09/12/2019  . Red blood cell antibody positive, compatible PRBC difficult to  obtain 02/15/2018  . Diplopia 01/03/2018  . Benign essential tremor 01/03/2018  . Pain in joint of right ankle 05/17/2017  . Foot pain 05/17/2017  . Peroneal tendinitis 05/17/2017  . Depression with anxiety 01/03/2017  . Insomnia 05/05/2016  . Coronary artery calcification seen on CAT scan   . DOE (dyspnea on exertion)   . Fatigue 02/27/2015  . Elevated homocysteine 09/26/2014  . Multiple myeloma ([facility]) 09/26/2014  . Acquired pes planus of right foot 07/02/2014  . Posterior tibial tendon dysfunction 07/02/2014  . Encounter for antineoplastic chemotherapy 01/02/2014  . Kahler disease ([place]) 01/02/2014  . Avitaminosis D 01/02/2014  . Amyloidosis ([facility]) 03/26/2013  . Enlarged lymph node 02/14/2013  . Pure hypercholesterolemia 03/29/2011  . Memory disorder 09/21/2010  . Labile hypertension 09/21/2010    Past Surgical History:  Procedure Laterality Date  . APPENDECTOMY    . CARDIAC CATHETERIZATION N/A 06/13/2015   Procedure: Left Heart Cath and Coronary Angiography;  Surgeon: Jettie Booze, MD;  Location: [facility];  Service: Cardiovascular;  Laterality: N/A;  . COLONOSCOPY  04/07/00  . ELBOW SURGERY  2003   right  . EXTRACORPOREAL SHOCK WAVE LITHOTRIPSY Left 07/17/2018   Procedure: EXTRACORPOREAL SHOCK WAVE LITHOTRIPSY (ESWL);  Surgeon: [name], MD;  Location: [facility];  Service: Urology;  Laterality: Left;  . HERNIA REPAIR    . ROTATOR CUFF REPAIR         Family History  Problem Relation Age of Onset  . Pancreatic cancer  Father   . Prostate cancer Father   . Parkinsonism Mother   . CAD Brother 73  . Cancer Paternal Grandmother   . Breast cancer Neg Hx   . Colon cancer Neg Hx     Social History   Tobacco Use  . Smoking status: Former Smoker    Packs/day: 3.00    Years: 20.00    Pack years: 60.00    Types: Cigarettes    Quit date: 03/15/1977    Years since quitting: 43.1  . Smokeless tobacco: Never Used  . Tobacco comment: quit 35 years ago  Vaping  Use  . Vaping Use: Never used  Substance Use Topics  . Alcohol use: Yes    Alcohol/week: 0.0 standard drinks    Comment: approx 1 drink/night  . Drug use: No    Home Medications Prior to Admission medications   Medication Sig Start Date End Date Taking? Authorizing Provider  cyclobenzaprine (FLEXERIL) 5 MG tablet Take 1 tablet (5 mg total) by mouth 2 (two) times daily as needed for up to 20 doses for muscle spasms. 05/06/20  Yes Marcello Fennel, PA-C  propranolol ER (INDERAL LA) 60 MG 24 hr capsule Take by mouth. 10/01/19 09/30/20  [provider]  zolpidem (AMBIEN) 5 MG tablet  01/15/20   [provider]    Allergies    Penicillins  Review of Systems   Review of Systems  Constitutional: Negative for chills and fever.  HENT: Negative for congestion and sore throat.   Respiratory: Negative for shortness of breath.   Cardiovascular: Positive for chest pain.  Gastrointestinal: Negative for abdominal pain, diarrhea, nausea and vomiting.  Genitourinary: Negative for enuresis.  Musculoskeletal: Negative for back pain.       Left scapula pain.  Skin: Negative for rash.  Neurological: Negative for dizziness and headaches.  Hematological: Does not bruise/bleed easily.    Physical Exam Updated Vital Signs BP (!) 142/76   Pulse 68   Temp 98.4 F (36.9 C) (Oral)   Resp 18   Ht '5\' 6"'  (1.676 m)   Wt 63.5 kg   SpO2 99%   BMI 22.60 kg/m   Physical Exam Vitals and nursing note reviewed.  Constitutional:      General: He is not in acute distress.    Appearance: He is not ill-appearing.  HENT:     Head: Normocephalic and atraumatic.     Nose: No congestion.  Eyes:     Conjunctiva/sclera: Conjunctivae normal.  Cardiovascular:     Rate and Rhythm: Normal rate and regular rhythm.     Pulses: Normal pulses.     Heart sounds: No murmur heard. No friction rub. No gallop.   Pulmonary:     Effort: No respiratory distress.     Breath sounds: No wheezing, rhonchi  or rales.  Abdominal:     Palpations: Abdomen is soft.     Tenderness: There is no abdominal tenderness.  Musculoskeletal:     Cervical back: Normal range of motion. No rigidity.     Right lower leg: No edema.     Left lower leg: No edema.     Comments: Patient spine was palpated nontender to palpation, no step-off or deformities present.  Patient's left scapula was palpated there is no deformities present, he was nontender to palpation, he had full range of motion at his left shoulder.  Ribs were palpated they are nontender to palpation, no crepitus or deformities present.  Skin:    General: Skin  is warm and dry.  Neurological:     Mental Status: He is alert.  Psychiatric:        Mood and Affect: Mood normal.     ED Results / Procedures / Treatments   Labs (all labs ordered are listed, but only abnormal results are displayed) Labs Reviewed  BASIC METABOLIC PANEL - Abnormal; Notable for the following components:      Result Value   Glucose, Bld 144 (*)    Calcium 8.3 (*)    All other components within normal limits  CBC WITH DIFFERENTIAL/PLATELET - Abnormal; Notable for the following components:   RBC 4.17 (*)    Hemoglobin 12.8 (*)    All other components within normal limits  D-DIMER, QUANTITATIVE - Abnormal; Notable for the following components:   D-Dimer, Quant 0.62 (*)    All other components within normal limits  TROPONIN I (HIGH SENSITIVITY) - Abnormal; Notable for the following components:   Troponin I (High Sensitivity) 43 (*)    All other components within normal limits  TROPONIN I (HIGH SENSITIVITY) - Abnormal; Notable for the following components:   Troponin I (High Sensitivity) 50 (*)    All other components within normal limits    EKG EKG Interpretation  Date/Time:  Tuesday May 06 2020 14:21:25 EST Ventricular Rate:  71 PR Interval:    QRS Duration: 96 QT Interval:  407 QTC Calculation: 443 R Axis:   58 Text Interpretation: Sinus rhythm  Anteroseptal infarct, old Borderline T abnormalities, inferior leads Baseline wander in lead(s) V3 No significant change since last tracing Confirmed by Nanda Quinton [phone]) on 05/06/2020 2:26:12 PM   Radiology DG Chest Port 1 View  Result Date: 05/06/2020 CLINICAL DATA:  4-5 days of chest pain and back pain EXAM: PORTABLE CHEST 1 VIEW COMPARISON:  Chest radiograph dated 03/19/2020 FINDINGS: The heart size is normal. Vascular calcifications are seen in the aortic arch. There is a small left pleural effusion with associated atelectasis/airspace disease. The right lung is clear. There is no pneumothorax. The visualized skeletal structures are unremarkable. IMPRESSION: Small left pleural effusion with associated atelectasis/airspace disease. Electronically Signed   By: Zerita Boers M.D.   On: 05/06/2020 15:35    Procedures Procedures   Medications Ordered in ED Medications  iohexol (OMNIPAQUE) 350 MG/ML injection 100 mL (100 mLs Intravenous Contrast Given 05/06/20 1541)    ED Course  I have reviewed the triage vital signs and the nursing notes.  Pertinent labs & imaging results that were available during my care of the patient were reviewed by me and considered in my medical decision making (see chart for details).    MDM Rules/Calculators/A&P                          Initial impression-patient presents with left scapular pain and chest pain currently resolved at this time.  Vital signs reassuring.  Will order chest pain work-up, add on D-dimer as he is taking AndroGel placed at a high risk of PE.  Work-up-CBC negative for leukocytosis, shows normocytic anemia appears to be a baseline for patient.  BMP shows slight hyperglycemia of 144, no anion gap present.  D-dimer slightly elevated 0.62, initial troponin is 43 second 50.  Chest x-ray shows left small pleural effusion likely from atelectasis or airspace disease.  EKG sinus without signs of ischemia no ST elevation depression noted.  CT  angio was negative for pulmonary embolism, shows numerous enlarged partially calcified lymph nodes  these are represent most likely sarcoidosis, also shows enlarged left and right pulmonary arteries suggestive of pulmonary hypertension.  Reassessment patient was reevaluated states he has slight left chest pain, does not radiate, updated him on lab, explained the patient his D-dimer is slightly elevated concerning for possible PE recommend angio chest  patient was agreeable to this.   Rule out- I have low suspicion for ACS as history is atypical, patient has no cardiac history, EKG was sinus rhythm without signs of ischemia, patient had a delta troponin.  Patient does have slightly elevated troponin but I doubt this is ACS, possible he might have some renal insufficiency causing increase in his troponins,  low suspicion for PE CT angio chest negative for acute findings.  Low suspicion for AAA or aortic dissection as history is atypical, patient has low risk factors.  Low suspicion for pneumonia or pulmonary infection as CT chest not show signs of infection.  Low suspicion for systemic infection as patient is nontoxic-appearing, vital signs reassuring, no obvious source infection noted on exam.  Plan-suspect patient's pain musculoskeletal vs sarcoidosis but differential diagnosis includes anxiety, costochondritis, acid reflux, cervical angina.  Will recommend over-the-counter pain medications, follow-up with PCP for further evaluation.  Vital signs have remained stable, no indication for hospital admission.  Patient discussed with attending and they agreed with assessment and plan.  Patient given at home care as well strict return precautions.  Patient verbalized that they understood agreed to said plan.   Final Clinical Impression(s) / ED Diagnoses Final diagnoses:  Atypical chest pain    Rx / DC Orders ED Discharge Orders         Ordered    cyclobenzaprine (FLEXERIL) 5 MG tablet  2 times daily PRN         05/06/20 1741           Marcello Fennel, PA-C 05/06/20 1819    Milton Ferguson, MD 05/07/20 [phone]

## 2020-05-07 ENCOUNTER — Ambulatory Visit
Admission: RE | Admit: 2020-05-07 | Discharge: 2020-05-07 | Disposition: A | Discharge status: Home / Self Care | Payer: Medicare Other | Source: Ambulatory Visit | Attending: Radiation Oncology | Admitting: Radiation Oncology

## 2020-05-07 ENCOUNTER — Other Ambulatory Visit: Payer: Self-pay

## 2020-05-07 DIAGNOSIS — C61 Malignant neoplasm of prostate: Secondary | ICD-10-CM | POA: Diagnosis not present

## 2020-05-08 ENCOUNTER — Ambulatory Visit
Admission: RE | Admit: 2020-05-08 | Discharge: 2020-05-08 | Disposition: A | Discharge status: Home / Self Care | Payer: Medicare Other | Source: Ambulatory Visit | Attending: Radiation Oncology | Admitting: Radiation Oncology

## 2020-05-08 ENCOUNTER — Other Ambulatory Visit: Payer: Self-pay

## 2020-05-08 DIAGNOSIS — C61 Malignant neoplasm of prostate: Secondary | ICD-10-CM | POA: Diagnosis not present

## 2020-05-09 ENCOUNTER — Other Ambulatory Visit: Payer: Self-pay

## 2020-05-09 ENCOUNTER — Ambulatory Visit
Admission: RE | Admit: 2020-05-09 | Discharge: 2020-05-09 | Disposition: A | Discharge status: Home / Self Care | Payer: Medicare Other | Source: Ambulatory Visit | Attending: Radiation Oncology | Admitting: Radiation Oncology

## 2020-05-09 DIAGNOSIS — C61 Malignant neoplasm of prostate: Secondary | ICD-10-CM | POA: Diagnosis not present

## 2020-05-12 ENCOUNTER — Other Ambulatory Visit: Payer: Self-pay

## 2020-05-12 ENCOUNTER — Ambulatory Visit
Admission: RE | Admit: 2020-05-12 | Discharge: 2020-05-12 | Disposition: A | Discharge status: Home / Self Care | Payer: Medicare Other | Source: Ambulatory Visit | Attending: Radiation Oncology | Admitting: Radiation Oncology

## 2020-05-12 DIAGNOSIS — C61 Malignant neoplasm of prostate: Secondary | ICD-10-CM | POA: Diagnosis not present

## 2020-05-13 ENCOUNTER — Ambulatory Visit
Admission: RE | Admit: 2020-05-13 | Discharge: 2020-05-13 | Disposition: A | Discharge status: Home / Self Care | Payer: Medicare Other | Source: Ambulatory Visit | Attending: Radiation Oncology | Admitting: Radiation Oncology

## 2020-05-13 ENCOUNTER — Other Ambulatory Visit: Payer: Self-pay

## 2020-05-13 DIAGNOSIS — C61 Malignant neoplasm of prostate: Secondary | ICD-10-CM | POA: Insufficient documentation

## 2020-05-14 ENCOUNTER — Ambulatory Visit
Admission: RE | Admit: 2020-05-14 | Discharge: 2020-05-14 | Disposition: A | Discharge status: Home / Self Care | Payer: Medicare Other | Source: Ambulatory Visit | Attending: Radiation Oncology | Admitting: Radiation Oncology

## 2020-05-14 ENCOUNTER — Other Ambulatory Visit: Payer: Self-pay

## 2020-05-14 DIAGNOSIS — C61 Malignant neoplasm of prostate: Secondary | ICD-10-CM | POA: Diagnosis not present

## 2020-05-15 ENCOUNTER — Other Ambulatory Visit: Payer: Self-pay

## 2020-05-15 ENCOUNTER — Ambulatory Visit
Admission: RE | Admit: 2020-05-15 | Discharge: 2020-05-15 | Disposition: A | Discharge status: Home / Self Care | Payer: Medicare Other | Source: Ambulatory Visit | Attending: Radiation Oncology | Admitting: Radiation Oncology

## 2020-05-15 DIAGNOSIS — C61 Malignant neoplasm of prostate: Secondary | ICD-10-CM | POA: Diagnosis not present

## 2020-05-16 ENCOUNTER — Other Ambulatory Visit: Payer: Self-pay

## 2020-05-16 ENCOUNTER — Ambulatory Visit
Admission: RE | Admit: 2020-05-16 | Discharge: 2020-05-16 | Disposition: A | Discharge status: Home / Self Care | Payer: Medicare Other | Source: Ambulatory Visit | Attending: Radiation Oncology | Admitting: Radiation Oncology

## 2020-05-16 DIAGNOSIS — C61 Malignant neoplasm of prostate: Secondary | ICD-10-CM | POA: Diagnosis not present

## 2020-05-19 ENCOUNTER — Ambulatory Visit
Admission: RE | Admit: 2020-05-19 | Discharge: 2020-05-19 | Disposition: A | Discharge status: Home / Self Care | Payer: Medicare Other | Source: Ambulatory Visit | Attending: Radiation Oncology | Admitting: Radiation Oncology

## 2020-05-19 ENCOUNTER — Other Ambulatory Visit: Payer: Self-pay

## 2020-05-19 DIAGNOSIS — C61 Malignant neoplasm of prostate: Secondary | ICD-10-CM | POA: Diagnosis not present

## 2020-05-20 ENCOUNTER — Other Ambulatory Visit: Payer: Self-pay

## 2020-05-20 ENCOUNTER — Ambulatory Visit
Admission: RE | Admit: 2020-05-20 | Discharge: 2020-05-20 | Disposition: A | Discharge status: Home / Self Care | Payer: Medicare Other | Source: Ambulatory Visit | Attending: Radiation Oncology | Admitting: Radiation Oncology

## 2020-05-20 DIAGNOSIS — C61 Malignant neoplasm of prostate: Secondary | ICD-10-CM | POA: Diagnosis not present

## 2020-05-21 ENCOUNTER — Other Ambulatory Visit: Payer: Self-pay

## 2020-05-21 ENCOUNTER — Ambulatory Visit
Admission: RE | Admit: 2020-05-21 | Discharge: 2020-05-21 | Disposition: A | Discharge status: Home / Self Care | Payer: Medicare Other | Source: Ambulatory Visit | Attending: Radiation Oncology | Admitting: Radiation Oncology

## 2020-05-21 DIAGNOSIS — C61 Malignant neoplasm of prostate: Secondary | ICD-10-CM | POA: Diagnosis not present

## 2020-05-22 ENCOUNTER — Other Ambulatory Visit: Payer: Self-pay

## 2020-05-22 ENCOUNTER — Ambulatory Visit
Admission: RE | Admit: 2020-05-22 | Discharge: 2020-05-22 | Disposition: A | Discharge status: Home / Self Care | Payer: Medicare Other | Source: Ambulatory Visit | Attending: Radiation Oncology | Admitting: Radiation Oncology

## 2020-05-22 DIAGNOSIS — C61 Malignant neoplasm of prostate: Secondary | ICD-10-CM | POA: Diagnosis not present

## 2020-05-23 ENCOUNTER — Other Ambulatory Visit: Payer: Self-pay

## 2020-05-23 ENCOUNTER — Ambulatory Visit
Admission: RE | Admit: 2020-05-23 | Discharge: 2020-05-23 | Disposition: A | Discharge status: Home / Self Care | Payer: Medicare Other | Source: Ambulatory Visit | Attending: Radiation Oncology | Admitting: Radiation Oncology

## 2020-05-23 DIAGNOSIS — C61 Malignant neoplasm of prostate: Secondary | ICD-10-CM | POA: Diagnosis not present

## 2020-05-26 ENCOUNTER — Ambulatory Visit
Admission: RE | Admit: 2020-05-26 | Discharge: 2020-05-26 | Disposition: A | Discharge status: Home / Self Care | Payer: Medicare Other | Source: Ambulatory Visit | Attending: Radiation Oncology | Admitting: Radiation Oncology

## 2020-05-26 ENCOUNTER — Other Ambulatory Visit: Payer: Self-pay

## 2020-05-26 DIAGNOSIS — C61 Malignant neoplasm of prostate: Secondary | ICD-10-CM | POA: Diagnosis not present

## 2020-05-27 ENCOUNTER — Ambulatory Visit
Admission: RE | Admit: 2020-05-27 | Discharge: 2020-05-27 | Disposition: A | Discharge status: Home / Self Care | Payer: Medicare Other | Source: Ambulatory Visit | Attending: Radiation Oncology | Admitting: Radiation Oncology

## 2020-05-27 DIAGNOSIS — C61 Malignant neoplasm of prostate: Secondary | ICD-10-CM | POA: Diagnosis not present

## 2020-05-28 ENCOUNTER — Ambulatory Visit
Admission: RE | Admit: 2020-05-28 | Discharge: 2020-05-28 | Disposition: A | Discharge status: Home / Self Care | Payer: Medicare Other | Source: Ambulatory Visit | Attending: Radiation Oncology | Admitting: Radiation Oncology

## 2020-05-28 DIAGNOSIS — C61 Malignant neoplasm of prostate: Secondary | ICD-10-CM | POA: Diagnosis not present

## 2020-05-29 ENCOUNTER — Ambulatory Visit
Admission: RE | Admit: 2020-05-29 | Discharge: 2020-05-29 | Disposition: A | Discharge status: Home / Self Care | Payer: Medicare Other | Source: Ambulatory Visit | Attending: Radiation Oncology | Admitting: Radiation Oncology

## 2020-05-29 ENCOUNTER — Other Ambulatory Visit: Payer: Self-pay

## 2020-05-29 DIAGNOSIS — C61 Malignant neoplasm of prostate: Secondary | ICD-10-CM | POA: Diagnosis not present

## 2020-05-30 ENCOUNTER — Other Ambulatory Visit: Payer: Self-pay

## 2020-05-30 ENCOUNTER — Ambulatory Visit
Admission: RE | Admit: 2020-05-30 | Discharge: 2020-05-30 | Disposition: A | Discharge status: Home / Self Care | Payer: Medicare Other | Source: Ambulatory Visit | Attending: Radiation Oncology | Admitting: Radiation Oncology

## 2020-05-30 DIAGNOSIS — C61 Malignant neoplasm of prostate: Secondary | ICD-10-CM | POA: Diagnosis not present

## 2020-06-02 ENCOUNTER — Ambulatory Visit
Admission: RE | Admit: 2020-06-02 | Discharge: 2020-06-02 | Disposition: A | Discharge status: Home / Self Care | Payer: Medicare Other | Source: Ambulatory Visit | Attending: Radiation Oncology | Admitting: Radiation Oncology

## 2020-06-02 ENCOUNTER — Other Ambulatory Visit: Payer: Self-pay

## 2020-06-02 ENCOUNTER — Telehealth: Payer: Self-pay | Admitting: Radiation Oncology

## 2020-06-02 ENCOUNTER — Encounter: Payer: Self-pay | Admitting: Urology

## 2020-06-02 DIAGNOSIS — C61 Malignant neoplasm of prostate: Secondary | ICD-10-CM

## 2020-06-02 NOTE — Telephone Encounter (Signed)
Received voicemail message from patient requesting return call. Phoned patient back to inquire. Patient reports rectal soreness and tenderness. Patient reports seeing bright red blood on the toilet tissue when wiping after a bowel movement. Patient states, "I am not sure why I didn't mention this to Dr. Tammi Klippel during my PUT visit today." Patient has received 39/40 intended prostate radiation treatment. Explained rectal irritation toward the end of prostate radiation isn't uncommon. Encouraged patient to use baby wipes with aloe when wiping after a bowel movement. Advised patient not to flush these wipes. Encouraged patient to take colace 100 mg one tablet twice per day to make stool soft and less uncomfortable to pass. Also, encouraged the use of a sitz bath. Finally, encouraged over the counter Preparation H. Explained these symptoms may persist for 2-3 s/p completion of radiation therapy but are expected to improve. Patient verbalized understanding of all reviewed. Instructed patient to phone this RN back with future needs or questions. Again, patient verbalized understanding.

## 2020-06-23 ENCOUNTER — Telehealth: Payer: Self-pay | Admitting: Neurology

## 2020-06-23 NOTE — Telephone Encounter (Signed)
I called pt to r/s 4/18 due to Dr. Felecia Shelling out, William Barron said to go ahead and just cancel the appointment. He transferred all his care over to [facility] including what he came to [facility] for. He said that Dr. Felecia Shelling is a wonderful doctor and to please let him know how thankful and appreciative he has been.

## 2020-06-30 ENCOUNTER — Telehealth: Payer: Self-pay

## 2020-06-30 ENCOUNTER — Ambulatory Visit: Payer: Medicare Other | Admitting: Neurology

## 2020-06-30 ENCOUNTER — Encounter: Payer: Self-pay | Admitting: Urology

## 2020-06-30 NOTE — Telephone Encounter (Signed)
Spoke with patient in regards to telephone appointment with Freeman Caldron PA on 07/09/20 @ 9:30am. Patient verbalized understanding of appointment date and time. Reviewed meaningful use, AUA and prostate questions. TM

## 2020-06-30 NOTE — Progress Notes (Signed)
Patient has telephone appointment with Freeman Caldron PA. Patient states nocturia 2 times per night. Patient states moderate dysuria. Patient states having regular bowel movements. Patient states that he is not emptying his bladder completely and returning to the bathroom less than 2 hours later. Patient states that his urine stream is weak. Patient states that he stream is steady half the time and the other half it stops and starts. Patient denies having to push to start urination. Patient states leakage post void. Patient states mild fatigue. Patient states that he has a follow-up appointment with [facility].

## 2020-07-09 ENCOUNTER — Ambulatory Visit
Admission: RE | Admit: 2020-07-09 | Discharge: 2020-07-09 | Disposition: A | Discharge status: Home / Self Care | Payer: Medicare Other | Source: Ambulatory Visit | Attending: Urology | Admitting: Urology

## 2020-07-09 DIAGNOSIS — C61 Malignant neoplasm of prostate: Secondary | ICD-10-CM

## 2020-07-09 NOTE — Progress Notes (Signed)
Radiation Oncology         [phone]) [phone] ________________________________  Name: William Barron MRN: [id number]  Date: 07/09/2020  DOB: 1945/07/15  Post Treatment Note  CC: [name], MD  [name], MD  Diagnosis:   75 y.o. gentleman with Stage T2 adenocarcinoma of the prostate with Gleason score of 4+4, and PSA of 4.11  Interval Since Last Radiation:  5.5 weeks  04/08/20 - 06/02/20: 1. The prostate, seminal vesicles, and pelvic lymph nodes were initially treated to 45 Gy in 25 fractions of 1.8 Gy  2. The prostate only was boosted to 75 Gy with 15 additional fractions of 2.0 Gy    Narrative:  I spoke with the patient to conduct his routine scheduled 1 month follow up visit via telephone to spare the patient unnecessary potential exposure in the healthcare setting during the current COVID-19 pandemic.  The patient was notified in advance and gave permission to proceed with this visit format.  He tolerated radiation treatment relatively well with only minor urinary irritation and modest fatigue.    He did not require ADT due to his naturally very low level of testosterone.  He reported increased frequency, urgency, weak stream, mild dysuria and nocturia x2. He also had some diarrhea early in treatments but this resolved prior to completion.  He did have some rectal irritation with some bright red blood on the toilet tissue with wiping in the last week of treatment.                              On review of systems, the patient states that he is doing well in general.  He continues with some weak stream, intermittency, increased frequency, mild dysuria and postvoid dribble but specifically denies gross hematuria or incontinence.  He also continues with mild fatigue but feels that this is gradually improving.  He has continued using AndroGel TRT daily as advised by Dr. Jeffie Pollock.  He reports that his bowels are back to normal and he denies any abdominal pain, nausea, vomiting, constipation or  diarrhea.  He reports a healthy appetite and is maintaining his weight.  Overall, he is pleased with his progress to date.  ALLERGIES:  is allergic to penicillins.  Meds: Current Outpatient Medications  Medication Sig Dispense Refill  . propranolol ER (INDERAL LA) 60 MG 24 hr capsule Take by mouth.    . Testosterone 20.25 MG/ACT (1.62%) GEL APPLY 4 PUMPS TO SKIN IN THE MORNING (APPLY AFTER SHOWER OR BATH TO CLEAN, DRY, INTACT SKIN ON UPPER ARM OR SHOULDER AREAS ONLY)    . zolpidem (AMBIEN) 5 MG tablet     . cyclobenzaprine (FLEXERIL) 5 MG tablet Take 1 tablet (5 mg total) by mouth 2 (two) times daily as needed for up to 20 doses for muscle spasms. (Patient not taking: Reported on 06/30/2020) 20 tablet 0   No current facility-administered medications for this encounter.    Physical Findings:  vitals were not taken for this visit.   /Unable to assess due to telephone follow-up visit format.  Lab Findings: Lab Results  Component Value Date   WBC 4.0 05/06/2020   HGB 12.8 (L) 05/06/2020   HCT 39.9 05/06/2020   MCV 95.7 05/06/2020   PLT 333 05/06/2020     Radiographic Findings: No results found.  Impression/Plan: 1. 75 y.o. gentleman with Stage T2 adenocarcinoma of the prostate with Gleason score of 4+4, and PSA of 4.11. He will  continue to follow up with urology for ongoing PSA determinations and has an appointment scheduled for labs on 07/25/2020 and a visit with Dr. Jeffie Pollock on 08/01/2020.  He has continued using the Androgel daily and is a little confused regarding this treatment but plans to discuss this further with Dr. Jeffie Pollock at his upcoming follow-up visit.  He understands what to expect with regards to PSA monitoring going forward. I will look forward to following his response to treatment via correspondence with urology, and would be happy to continue to participate in his care if clinically indicated. I talked to the patient about what to expect in the future, including his risk for  erectile dysfunction and rectal bleeding. I encouraged him to call or return to the office if he has any questions regarding his previous radiation or possible radiation side effects. He was comfortable with this plan and will follow up as needed.    William Johns, PA-C

## 2020-07-09 NOTE — Progress Notes (Signed)
  Radiation Oncology         [phone]) [phone] ________________________________  Name: William Barron MRN: [id number]  Date: 06/02/2020  DOB: 1945-10-06  End of Treatment Note  Diagnosis:   75 y.o. gentleman with Stage T2 adenocarcinoma of the prostate with Gleason score of 4+4, and PSA of 4.11     Indication for treatment:  Curative, Definitive Radiotherapy       Radiation treatment dates:   04/08/20 - 06/02/20  Site/dose:  1. The prostate, seminal vesicles, and pelvic lymph nodes were initially treated to 45 Gy in 25 fractions of 1.8 Gy  2. The prostate only was boosted to 75 Gy with 15 additional fractions of 2.0 Gy   Beams/energy:  1. The prostate, seminal vesicles, and pelvic lymph nodes were initially treated using VMAT intensity modulated radiotherapy delivering 6 megavolt photons. Image guidance was performed with CB-CT studies prior to each fraction. He was immobilized with a body fix lower extremity mold.  2. the prostate only was boosted using VMAT intensity modulated radiotherapy delivering 6 megavolt photons. Image guidance was performed with CB-CT studies prior to each fraction. He was immobilized with a body fix lower extremity mold.  Narrative: The patient tolerated radiation treatment relatively well with only minor urinary irritation and modest fatigue.  He reported increased frequency, urgency, weak stream, mild dysuria and nocturia x2.  He also had some diarrhea early in treatments but this resolved prior to completion.  He did have some rectal irritation with some bright red blood on the toilet tissue with wiping in the last week of treatment.  Plan: The patient has completed radiation treatment. He will return to radiation oncology clinic for routine followup in one month. I advised him to call or return sooner if he has any questions or concerns related to his recovery or treatment. ________________________________  Sheral Apley. Tammi Klippel, M.D.

## 2020-08-18 ENCOUNTER — Ambulatory Visit (INDEPENDENT_AMBULATORY_CARE_PROVIDER_SITE_OTHER): Payer: Medicare Other

## 2020-08-18 ENCOUNTER — Other Ambulatory Visit: Payer: Self-pay

## 2020-08-18 ENCOUNTER — Ambulatory Visit (INDEPENDENT_AMBULATORY_CARE_PROVIDER_SITE_OTHER): Payer: Medicare Other | Admitting: Orthopedic Surgery

## 2020-08-18 DIAGNOSIS — M25571 Pain in right ankle and joints of right foot: Secondary | ICD-10-CM

## 2020-08-18 DIAGNOSIS — M76821 Posterior tibial tendinitis, right leg: Secondary | ICD-10-CM

## 2020-09-02 ENCOUNTER — Encounter: Payer: Self-pay | Admitting: Orthopedic Surgery

## 2020-09-02 NOTE — Progress Notes (Signed)
Office Visit Note   Patient: William Barron           Date of Birth: October 19, 1945           MRN: [id number] Visit Date: 08/18/2020              Requested by: [name], MD [address] [place],  [place] [address] PCP: [name], MD  Chief Complaint  Patient presents with   Right Ankle - Pain      HPI: Patient is a 75 year old gentleman who is seen for initial evaluation for right ankle pain.  Patient states that he was evaluated in May 2019 and was told he may need an MRI to evaluate his peroneal tendons.  Patient has been using Hoka sneakers and CBD ointment.  Patient states that he saw physician at [facility] orthopedic and was given an ankle stabilizing orthosis.  Assessment & Plan: Visit D ischial evaluation for right ankle pain.  Patient states that he was evaluated in May 2019 iagnoses:  1. Pain in right ankle and joints of right foot     Plan: Patient clinically has posterior tibial tendon insufficiency recommend continuing with the firm orthosis and fascial strengthening discussed the possibility of a subtalar and talonavicular fusion.  Follow-Up Instructions: Return if symptoms worsen or fail to improve.   Ortho Exam  Patient is alert, oriented, no adenopathy, well-dressed, normal affect, normal respiratory effort. Examination patient has good pulses he has good forearm custom orthotics he has pain to palpation over the posterior tibial tendon with pronation and valgus of his foot with a positive too many toes sign he has palpable pulses.  There is minimal subtalar motion he has good ankle range of motion.  He cannot do a single limb heel raise.  Imaging: No results found. No images are attached to the encounter.  Labs: Lab Results  Component Value Date   REPTSTATUS 05/04/2015 FINAL 05/01/2015   CULT  05/01/2015    >=100,000 COLONIES/mL ESCHERICHIA COLI Performed at [facility] 05/01/2015     Lab Results   Component Value Date   ALBUMIN 3.9 06/21/2018   ALBUMIN 3.9 06/13/2018   ALBUMIN 4.2 04/03/2015    No results found for: MG Lab Results  Component Value Date   VD25OH 24.1 (L) 09/26/2014    No results found for: PREALBUMIN CBC EXTENDED Latest Ref Rng & Units 05/06/2020 07/13/2018 06/21/2018  WBC 4.0 - 10.5 K/uL 4.0 10.0 7.4  RBC 4.22 - 5.81 MIL/uL 4.17(L) 5.54 5.05  HGB 13.0 - 17.0 g/dL 12.8(L) 16.8 15.5  HCT 39.0 - 52.0 % 39.9 52.9(H) 46.9  PLT 150 - 400 K/uL 333 312 240  NEUTROABS 1.7 - 7.7 K/uL 2.3 6.2 4.8  LYMPHSABS 0.7 - 4.0 K/uL 0.8 2.4 1.5     There is no height or weight on file to calculate BMI.  Orders:  Orders Placed This Encounter  Procedures   XR Ankle 2 Views Right   No orders of the defined types were placed in this encounter.    Procedures: No procedures performed  Clinical Data: No additional findings.  ROS:  All other systems negative, except as noted in the HPI. Review of Systems  Objective: Vital Signs: There were no vitals taken for this visit.  Specialty Comments:  No specialty comments available.  PMFS History: Patient Active Problem List   Diagnosis Date Noted   Malignant neoplasm of prostate ([place]) 02/11/2020   Orthostatic tremor 10/01/2019  Educated about COVID-19 virus infection 09/12/2019   Dyslipidemia 09/12/2019   Nonrheumatic pulmonary valve insufficiency 09/12/2019   Red blood cell antibody positive, compatible PRBC difficult to obtain 02/15/2018   Diplopia 01/03/2018   Benign essential tremor 01/03/2018   Pain in joint of right ankle 05/17/2017   Foot pain 05/17/2017   Peroneal tendinitis 05/17/2017   Depression with anxiety 01/03/2017   Insomnia 05/05/2016   Coronary artery calcification seen on CAT scan    DOE (dyspnea on exertion)    Fatigue 02/27/2015   Elevated homocysteine 09/26/2014   Multiple myeloma ([place]) 09/26/2014   Acquired pes planus of right foot 07/02/2014   Posterior tibial tendon dysfunction  07/02/2014   Encounter for antineoplastic chemotherapy 01/02/2014   Kahler disease ([place]) 01/02/2014   Avitaminosis D 01/02/2014   Amyloidosis ([place]) 03/26/2013   Enlarged lymph node 02/14/2013   Pure hypercholesterolemia 03/29/2011   Memory disorder 09/21/2010   Labile hypertension 09/21/2010   Past Medical History:  Diagnosis Date   Amyloidosis (H[facility])    GERD (gastroesophageal reflux disease)    Hearing loss    Has hearing aids   History of kidney stones    Hyperlipidemia    Multiple myeloma ([facility])    and amylodosis    Nephrolithiasis    Prostate cancer ([place])    Prostatitis    acute and chronic   Skin cancer     Family History  Problem Relation Age of Onset   Pancreatic cancer Father    Prostate cancer Father    Parkinsonism Mother    CAD Brother 68   Cancer Paternal Grandmother    Breast cancer Neg Hx    Colon cancer Neg Hx     Past Surgical History:  Procedure Laterality Date   APPENDECTOMY     CARDIAC CATHETERIZATION N/A 06/13/2015   Procedure: Left Heart Cath and Coronary Angiography;  Surgeon: Jettie Booze, MD;  Location: [facility];  Service: Cardiovascular;  Laterality: N/A;   COLONOSCOPY  04/07/00   ELBOW SURGERY  2003   right   EXTRACORPOREAL SHOCK WAVE LITHOTRIPSY Left 07/17/2018   Procedure: EXTRACORPOREAL SHOCK WAVE LITHOTRIPSY (ESWL);  Surgeon: [name], MD;  Location: [facility];  Service: Urology;  Laterality: Left;   HERNIA REPAIR     ROTATOR CUFF REPAIR     Social History   Occupational History   Occupation: retired  Tobacco Use   Smoking status: Former    Packs/day: 3.00    Years: 20.00    Pack years: 60.00    Types: Cigarettes    Quit date: 03/15/1977    Years since quitting: 43.4   Smokeless tobacco: Never   Tobacco comments:    quit 35 years ago  Vaping Use   Vaping Use: Never used  Substance and Sexual Activity   Alcohol use: Yes    Alcohol/week: 0.0 standard drinks    Comment: approx 1 drink/night   Drug use: No    Sexual activity: Not Currently    Birth control/protection: None

## 2020-09-12 ENCOUNTER — Other Ambulatory Visit (HOSPITAL_BASED_OUTPATIENT_CLINIC_OR_DEPARTMENT_OTHER): Payer: Self-pay

## 2020-09-12 ENCOUNTER — Emergency Department (HOSPITAL_BASED_OUTPATIENT_CLINIC_OR_DEPARTMENT_OTHER)
Admission: EM | Admit: 2020-09-12 | Discharge: 2020-09-12 | Disposition: A | Discharge status: Home / Self Care | Payer: Medicare Other | Attending: Emergency Medicine | Admitting: Emergency Medicine

## 2020-09-12 ENCOUNTER — Encounter: Payer: Self-pay | Admitting: Hematology & Oncology

## 2020-09-12 ENCOUNTER — Encounter (HOSPITAL_BASED_OUTPATIENT_CLINIC_OR_DEPARTMENT_OTHER): Payer: Self-pay | Admitting: Emergency Medicine

## 2020-09-12 ENCOUNTER — Other Ambulatory Visit: Payer: Self-pay

## 2020-09-12 DIAGNOSIS — H60392 Other infective otitis externa, left ear: Secondary | ICD-10-CM | POA: Diagnosis not present

## 2020-09-12 DIAGNOSIS — Z87891 Personal history of nicotine dependence: Secondary | ICD-10-CM | POA: Insufficient documentation

## 2020-09-12 DIAGNOSIS — Z8546 Personal history of malignant neoplasm of prostate: Secondary | ICD-10-CM | POA: Insufficient documentation

## 2020-09-12 DIAGNOSIS — H9202 Otalgia, left ear: Secondary | ICD-10-CM | POA: Diagnosis present

## 2020-09-12 MED ORDER — CIPROFLOXACIN-DEXAMETHASONE 0.3-0.1 % OT SUSP
4.0000 [drp] | Freq: Two times a day (BID) | OTIC | 0 refills | Status: DC
  Filled 2020-09-12 (×2): qty 7.5, 10d supply, fill #0

## 2020-09-12 MED ORDER — CIPROFLOXACIN-DEXAMETHASONE 0.3-0.1 % OT SUSP
4.0000 [drp] | Freq: Once | OTIC | Status: AC
  Administered 2020-09-12: 4 [drp] via OTIC
  Filled 2020-09-12: qty 7.5

## 2020-09-12 NOTE — ED Provider Notes (Signed)
[facility] EMERGENCY DEPARTMENT Provider Note   CSN: [id number] Arrival date & time: 09/12/20  0705     History Chief Complaint  Patient presents with   left ear pain    William Barron is a 75 y.o. male.  HPI Patient has had symptoms of pain in the left ear for over 3 to 4 weeks.  He wears hearing aids typically.  He went to an urgent care 2 weeks ago and was prescribed neomycin drops and Cerumenex.  He reports he was just stubborn and for some reason opted not to use either of those treatments.  He reports now the pain has gotten worse and he cannot wear his hearing aids anymore on the left side.  He reports he has mild symptoms on the right.  Last night for the first time he used some of the drops.  Today he is planning to go to [facility] and was concerned about going out of town with the symptoms worsening.  Denies fever or headache.  No associated symptoms.  Patient does not swim or do other activities that would put water continuously in his ear.    Past Medical History:  Diagnosis Date   Amyloidosis ([place])    GERD (gastroesophageal reflux disease)    Hearing loss    Has hearing aids   History of kidney stones    Hyperlipidemia    Multiple myeloma ([place])    and amylodosis    Nephrolithiasis    Prostate cancer ([place])    Prostatitis    acute and chronic   Skin cancer     Patient Active Problem List   Diagnosis Date Noted   Malignant neoplasm of prostate ([place]) 02/11/2020   Orthostatic tremor 10/01/2019   Educated about COVID-19 virus infection 09/12/2019   Dyslipidemia 09/12/2019   Nonrheumatic pulmonary valve insufficiency 09/12/2019   Red blood cell antibody positive, compatible PRBC difficult to obtain 02/15/2018   Diplopia 01/03/2018   Benign essential tremor 01/03/2018   Pain in joint of right ankle 05/17/2017   Foot pain 05/17/2017   Peroneal tendinitis 05/17/2017   Depression with anxiety 01/03/2017   Insomnia 05/05/2016   Coronary  artery calcification seen on CAT scan    DOE (dyspnea on exertion)    Fatigue 02/27/2015   Elevated homocysteine 09/26/2014   Multiple myeloma ([place]) 09/26/2014   Acquired pes planus of right foot 07/02/2014   Posterior tibial tendon dysfunction 07/02/2014   Encounter for antineoplastic chemotherapy 01/02/2014   Kahler disease ([place]) 01/02/2014   Avitaminosis D 01/02/2014   Amyloidosis ([place]) 03/26/2013   Enlarged lymph node 02/14/2013   Pure hypercholesterolemia 03/29/2011   Memory disorder 09/21/2010   Labile hypertension 09/21/2010    Past Surgical History:  Procedure Laterality Date   APPENDECTOMY     CARDIAC CATHETERIZATION N/A 06/13/2015   Procedure: Left Heart Cath and Coronary Angiography;  Surgeon: Jettie Booze, MD;  Location: [facility];  Service: Cardiovascular;  Laterality: N/A;   COLONOSCOPY  04/07/00   ELBOW SURGERY  2003   right   EXTRACORPOREAL SHOCK WAVE LITHOTRIPSY Left 07/17/2018   Procedure: EXTRACORPOREAL SHOCK WAVE LITHOTRIPSY (ESWL);  Surgeon: [name], MD;  Location: [facility];  Service: Urology;  Laterality: Left;   HERNIA REPAIR     ROTATOR CUFF REPAIR         Family History  Problem Relation Age of Onset   Pancreatic cancer Father    Prostate cancer Father    Parkinsonism Mother  CAD Brother 34   Cancer Paternal Grandmother    Breast cancer Neg Hx    Colon cancer Neg Hx     Social History   Tobacco Use   Smoking status: Former    Packs/day: 3.00    Years: 20.00    Pack years: 60.00    Types: Cigarettes    Quit date: 03/15/1977    Years since quitting: 43.5   Smokeless tobacco: Never   Tobacco comments:    quit 35 years ago  Vaping Use   Vaping Use: Never used  Substance Use Topics   Alcohol use: Yes    Alcohol/week: 0.0 standard drinks    Comment: approx 1 drink/night   Drug use: No    Home Medications Prior to Admission medications   Medication Sig Start Date End Date Taking? Authorizing Provider   ciprofloxacin-dexamethasone (CIPRODEX) OTIC suspension Place 4 drops into both ears 2 (two) times daily. 09/12/20  Yes Charlesetta Shanks, MD  cyclobenzaprine (FLEXERIL) 5 MG tablet Take 1 tablet (5 mg total) by mouth 2 (two) times daily as needed for up to 20 doses for muscle spasms. Patient not taking: Reported on 06/30/2020 05/06/20   Marcello Fennel, PA-C  propranolol ER (INDERAL LA) 60 MG 24 hr capsule Take by mouth. 10/01/19 09/30/20  [provider]  Testosterone 20.25 MG/ACT (1.62%) GEL APPLY 4 PUMPS TO SKIN IN THE MORNING (APPLY AFTER SHOWER OR BATH TO CLEAN, DRY, INTACT SKIN ON UPPER ARM OR SHOULDER AREAS ONLY) 06/13/20   [provider]  zolpidem (AMBIEN) 5 MG tablet  01/15/20   [provider]    Allergies    Penicillins  Review of Systems   Review of Systems As additional: No fever no malaise ENT: No sore throat no nasal congestion no dental pain Neurologic: No headache no confusion no neck stiffness Physical Exam Updated Vital Signs BP (!) 142/78 (BP Location: Right Arm)   Pulse 78   Temp 98.5 F (36.9 C) (Oral)   Resp 16   Ht _0  (1.676 m)   Wt 65.8 kg   SpO2 99%   BMI 23.40 kg/m   Physical Exam Constitutional:      Comments: Alert nontoxic well in appearance.  Ambulatory without difficulty  HENT:     Head: Normocephalic and atraumatic.     Ears:     Comments: External ear normal bilaterally.  Left ear canal moderate swelling with only small window back to TM.  Cannot distinctly visualize TM.  Appears thickened and white.  Some whitish discharge in the ear canal but still patent.  Right TM thickened but canal only mild swelling with mild discharge.  Easily visualized TM.  No cerumen impaction either side.    Nose: Nose normal.     Mouth/Throat:     Mouth: Mucous membranes are moist.     Pharynx: Oropharynx is clear.     Comments: No facial swelling.  Mucous membranes pink moist.  Oral cavity widely patent. Eyes:     Extraocular  Movements: Extraocular movements intact.     Pupils: Pupils are equal, round, and reactive to light.  Cardiovascular:     Rate and Rhythm: Normal rate and regular rhythm.     Comments: Heart regular 2\6 systolic ejection Pulmonary:     Effort: Pulmonary effort is normal.     Breath sounds: Normal breath sounds.  Skin:    General: Skin is warm and dry.  Neurological:     General: No focal deficit  present.     Mental Status: He is oriented to person, place, and time.     Coordination: Coordination normal.  Psychiatric:        Mood and Affect: Mood normal.    ED Results / Procedures / Treatments   Labs (all labs ordered are listed, but only abnormal results are displayed) Labs Reviewed - No data to display  EKG None  Radiology No results found.  Procedures Procedures  Ear wick placed by myself. Medications Ordered in ED Medications  ciprofloxacin-dexamethasone (CIPRODEX) 0.3-0.1 % OTIC (EAR) suspension 4 drop (has no administration in time range)    ED Course  I have reviewed the triage vital signs and the nursing notes.  Pertinent labs & imaging results that were available during my care of the patient were reviewed by me and considered in my medical decision making (see chart for details).    MDM Rules/Calculators/A&P                          Patient presents with moderate otitis externa on the left.  I placed an ear wick and will start Ciprodex.  Mild canal swelling and discharge on the right.  Bilateral pinnae are normal.  No mastoid swelling.  No headache no neck stiffness or fever at this time stable for Ciprodex and Tylenol recommended for pain.  Return precautions and follow-up reviewed. Final Clinical Impression(s) / ED Diagnoses Final diagnoses:  Other infective acute otitis externa of left ear    Rx / DC Orders ED Discharge Orders          Ordered    ciprofloxacin-dexamethasone (CIPRODEX) OTIC suspension  2 times daily        09/12/20 0736              Charlesetta Shanks, MD 09/12/20 [phone]

## 2020-09-12 NOTE — Discharge Instructions (Addendum)
1.  Placed Ciprodex in both ears twice daily.  4 drops in each ear. 2.  An ear wick has been placed in your left ear as this is the ear with the worst symptoms.  Leave this in until it falls out on its own.  Continue using the antibiotic drops for a total of 10 days, or 3 days after the symptoms are completely improved. 3.  Schedule appointment to see your family doctor for recheck within 7 to 14 days. 4.  Return to the emergency department if you are getting worsening pain, swelling, fever, headache, chills or other concerning symptoms.

## 2020-09-12 NOTE — ED Triage Notes (Signed)
Patient states left ear feels swollen, painful, and having decreased hearing x 1 month. Got drops from an urgent care, but did not use. Pain and hearing deficit increased last night. Leaving for beach today.

## 2020-10-14 ENCOUNTER — Ambulatory Visit (INDEPENDENT_AMBULATORY_CARE_PROVIDER_SITE_OTHER): Payer: Medicare Other | Admitting: Orthopedic Surgery

## 2020-10-14 ENCOUNTER — Other Ambulatory Visit: Payer: Self-pay

## 2020-10-14 ENCOUNTER — Encounter: Payer: Self-pay | Admitting: Orthopedic Surgery

## 2020-10-14 DIAGNOSIS — M25571 Pain in right ankle and joints of right foot: Secondary | ICD-10-CM | POA: Diagnosis not present

## 2020-10-14 DIAGNOSIS — M76821 Posterior tibial tendinitis, right leg: Secondary | ICD-10-CM | POA: Diagnosis not present

## 2020-10-14 NOTE — Progress Notes (Signed)
Office Visit Note   Patient: William Barron           Date of Birth: 04-03-1945           MRN: [id number] Visit Date: 10/14/2020              Requested by: [name], MD [address] [place],  [place] [address] PCP: [name], MD  Chief Complaint  Patient presents with   Right Ankle - Follow-up   Right Foot - Follow-up      HPI: Patient is a 75 year old gentleman who states he is put up with decades worth of pain from his posterior tibial tendon insufficiency he complains of swelling pain with activities of daily living.  He has tried conservative therapy with stiff soled sneakers arch support ankle bracing without relief.  Assessment & Plan: Visit Diagnoses:  1. Posterior tibial tendinitis, right leg   2. Pain in right ankle and joints of right foot     Plan: Patient wishes to proceed with surgical intervention risk and benefits were discussed we will plan for talonavicular and subtalar fusion patient will need to be off his foot completely for 6 weeks with a kneeling scooter.  Patient states [place] is leaving [place] in [place] and would like to proceed as soon as possible.  Follow-Up Instructions: Return in about 2 weeks (around 10/28/2020).   Ortho Exam  Patient is alert, oriented, no adenopathy, well-dressed, normal affect, normal respiratory effort. Examination patient has a palpable dorsalis pedis and posterior tibial pulse he has a pronated valgus foot with pain and swelling over the posterior tibial tendon he has weakness and pain reproduced with inversion he has tenderness to palpation over the sinus Tarsi with lateral impingement he only has about 10 degrees of inversion and eversion of the subtalar joint.  Patient cannot do a single limb heel raise.   Imaging: No results found. No images are attached to the encounter.  Labs: Lab Results  Component Value Date   REPTSTATUS 05/04/2015 FINAL 05/01/2015   CULT  05/01/2015    >=100,000  COLONIES/mL ESCHERICHIA COLI Performed at [facility] 05/01/2015     Lab Results  Component Value Date   ALBUMIN 3.9 06/21/2018   ALBUMIN 3.9 06/13/2018   ALBUMIN 4.2 04/03/2015    No results found for: MG Lab Results  Component Value Date   VD25OH 24.1 (L) 09/26/2014    No results found for: PREALBUMIN CBC EXTENDED Latest Ref Rng & Units 05/06/2020 07/13/2018 06/21/2018  WBC 4.0 - 10.5 K/uL 4.0 10.0 7.4  RBC 4.22 - 5.81 MIL/uL 4.17(L) 5.54 5.05  HGB 13.0 - 17.0 g/dL 12.8(L) 16.8 15.5  HCT 39.0 - 52.0 % 39.9 52.9(H) 46.9  PLT 150 - 400 K/uL 333 312 240  NEUTROABS 1.7 - 7.7 K/uL 2.3 6.2 4.8  LYMPHSABS 0.7 - 4.0 K/uL 0.8 2.4 1.5     There is no height or weight on file to calculate BMI.  Orders:  No orders of the defined types were placed in this encounter.  No orders of the defined types were placed in this encounter.    Procedures: No procedures performed  Clinical Data: No additional findings.  ROS:  All other systems negative, except as noted in the HPI. Review of Systems  Objective: Vital Signs: There were no vitals taken for this visit.  Specialty Comments:  No specialty comments available.  PMFS History: Patient Active Problem List  Diagnosis Date Noted   Malignant neoplasm of prostate ([place]) 02/11/2020   Orthostatic tremor 10/01/2019   Educated about COVID-19 virus infection 09/12/2019   Dyslipidemia 09/12/2019   Nonrheumatic pulmonary valve insufficiency 09/12/2019   Red blood cell antibody positive, compatible PRBC difficult to obtain 02/15/2018   Diplopia 01/03/2018   Benign essential tremor 01/03/2018   Pain in joint of right ankle 05/17/2017   Foot pain 05/17/2017   Peroneal tendinitis 05/17/2017   Depression with anxiety 01/03/2017   Insomnia 05/05/2016   Coronary artery calcification seen on CAT scan    DOE (dyspnea on exertion)    Fatigue 02/27/2015   Elevated homocysteine 09/26/2014   Multiple  myeloma ([place]) 09/26/2014   Acquired pes planus of right foot 07/02/2014   Posterior tibial tendon dysfunction 07/02/2014   Encounter for antineoplastic chemotherapy 01/02/2014   Kahler disease ([place]) 01/02/2014   Avitaminosis D 01/02/2014   Amyloidosis ([place]) 03/26/2013   Enlarged lymph node 02/14/2013   Pure hypercholesterolemia 03/29/2011   Memory disorder 09/21/2010   Labile hypertension 09/21/2010   Past Medical History:  Diagnosis Date   Amyloidosis (H[facility])    GERD (gastroesophageal reflux disease)    Hearing loss    Has hearing aids   History of kidney stones    Hyperlipidemia    Multiple myeloma ([facility])    and amylodosis    Nephrolithiasis    Prostate cancer ([place])    Prostatitis    acute and chronic   Skin cancer     Family History  Problem Relation Age of Onset   Pancreatic cancer Father    Prostate cancer Father    Parkinsonism Mother    CAD Brother 39   Cancer Paternal Grandmother    Breast cancer Neg Hx    Colon cancer Neg Hx     Past Surgical History:  Procedure Laterality Date   APPENDECTOMY     CARDIAC CATHETERIZATION N/A 06/13/2015   Procedure: Left Heart Cath and Coronary Angiography;  Surgeon: Jettie Booze, MD;  Location: [facility];  Service: Cardiovascular;  Laterality: N/A;   COLONOSCOPY  04/07/00   ELBOW SURGERY  2003   right   EXTRACORPOREAL SHOCK WAVE LITHOTRIPSY Left 07/17/2018   Procedure: EXTRACORPOREAL SHOCK WAVE LITHOTRIPSY (ESWL);  Surgeon: [name], MD;  Location: [facility];  Service: Urology;  Laterality: Left;   HERNIA REPAIR     ROTATOR CUFF REPAIR     Social History   Occupational History   Occupation: retired  Tobacco Use   Smoking status: Former    Packs/day: 3.00    Years: 20.00    Pack years: 60.00    Types: Cigarettes    Quit date: 03/15/1977    Years since quitting: 43.6   Smokeless tobacco: Never   Tobacco comments:    quit 35 years ago  Vaping Use   Vaping Use: Never used  Substance and Sexual Activity    Alcohol use: Yes    Alcohol/week: 0.0 standard drinks    Comment: approx 1 drink/night   Drug use: No   Sexual activity: Not Currently    Birth control/protection: None

## 2020-10-18 NOTE — Progress Notes (Signed)
Cardiology Office Note   Date:  10/20/2020   ID:  William Barron, DOB 1945-07-07, MRN [id number]  PCP:  [name], MD  Cardiologist:   Minus Breeding, MD   Chief Complaint  Patient presents with   Pulmonary Regurgitation       History of Present Illness: William Barron is a 75 y.o. male who presents for evaluation of coronary calcium.  He had an abnormal POET (Plain Old Exercise Treadmill) and significant calcium in his coronaries.  However, cath demonstrated mild CAD. He had a follow up echo last year.   He had some moderate PR but this was unchanged from previous.  He has no pulmonary HTN and normal RV/LV function.  He returns for follow up.   Since I last saw him he has had now 99 radiation therapies for prostate cancer.  He is continue to be followed at [facility] where he is getting getting daratumumab/dexamethasone for multiple myeloma.  He is also being treated with ixazomib.  As part of his follow-up he did recently have both an echocardiogram and an MRI.  I was able to review these results.  His echocardiogram demonstrated an EF of 50% which is slightly lower than previous.  He has mild aortic regurgitation.  There is mild mitral regurgitation.  He has moderate pulmonary regurgitation.  This by description has not changed from previous.  He did have an MRI.  This demonstrated an EF of 58%.  There was mild to moderate aortic regurgitation but no stenosis.  There was mild to moderate pulmonary regurgitation.  There was some delayed enhancement and mildly in the basal LV and lateral septal walls.  There was no evidence of myocardial infarction or this was not specifically consistent with amyloid.  There was a small pericardial effusion.  He is also going to need surgery on his ankle soon.  He has not been riding his exercise bike like he was previously.  He would like to get back to this.  He is not having any chest pressure, neck or arm discomfort.  He is not having any new  shortness of breath, PND or orthopnea.  He has had no weight gain or edema.  Past Medical History:  Diagnosis Date   Amyloidosis ([name])    GERD (gastroesophageal reflux disease)    Hearing loss    Has hearing aids   History of kidney stones    Hyperlipidemia    Multiple myeloma (H[facility])    and amylodosis    Nephrolithiasis    Prostate cancer ([name])    Prostatitis    acute and chronic   Skin cancer     Past Surgical History:  Procedure Laterality Date   APPENDECTOMY     CARDIAC CATHETERIZATION N/A 06/13/2015   Procedure: Left Heart Cath and Coronary Angiography;  Surgeon: Jettie Booze, MD;  Location: [facility];  Service: Cardiovascular;  Laterality: N/A;   COLONOSCOPY  04/07/00   ELBOW SURGERY  2003   right   EXTRACORPOREAL SHOCK WAVE LITHOTRIPSY Left 07/17/2018   Procedure: EXTRACORPOREAL SHOCK WAVE LITHOTRIPSY (ESWL);  Surgeon: [name], MD;  [facility];  Service: Urology;  Laterality: Left;   HERNIA REPAIR     ROTATOR CUFF REPAIR       Current Outpatient Medications  Medication Sig Dispense Refill   daratumumab (DARZALEX) 100 MG/5ML Inject into the vein.     dexamethasone (DECADRON) 2 MG tablet Take 4 mg by mouth daily.  propranolol ER (INDERAL LA) 60 MG 24 hr capsule Take 60 mg by mouth daily.     Testosterone 20.25 MG/ACT (1.62%) GEL APPLY 4 PUMPS TO SKIN IN THE MORNING (APPLY AFTER SHOWER OR BATH TO CLEAN, DRY, INTACT SKIN ON UPPER ARM OR SHOULDER AREAS ONLY)     zolpidem (AMBIEN) 5 MG tablet      No current facility-administered medications for this visit.    Allergies:   Penicillins    ROS:  Please see the history of present illness.   Otherwise, review of systems are positive for orthostatic leg tremors, arm tremors.   All other systems are reviewed and negative.    PHYSICAL EXAM: VS:  BP 124/68 (BP Location: Left Arm, Patient Position: Sitting, Cuff Size: Normal)   Pulse 72   Ht '5\' 6"'  (1.676 m)   Wt 149 lb (67.6 kg)   SpO2 98%    BMI 24.05 kg/m  , BMI Body mass index is 24.05 kg/m.  GENERAL:  Well appearing NECK:  No jugular venous distention, waveform within normal limits, carotid upstroke brisk and symmetric, no bruits, no thyromegaly LUNGS:  Clear to auscultation bilaterally CHEST:  Unremarkable HEART:  PMI not displaced or sustained,S1 and S2 within normal limits, no S3, no S4, no clicks, no rubs, 2 out of 6 brief apical systolic murmur nonradiating, no diastolic murmurs ABD:  Flat, positive bowel sounds normal in frequency in pitch, no bruits, no rebound, no guarding, no midline pulsatile mass, no hepatomegaly, no splenomegaly EXT:  2 plus pulses throughout, no edema, no cyanosis no clubbing  EKG:  EKG is  ordered today. Sinus rhythm, rate 72, axis within normal limits, intervals within normal limits, no acute ST-T wave changes.  Early repolarization changes.  No change from previous  Recent Labs: 05/06/2020: BUN 9; Creatinine, Ser 0.93; Hemoglobin 12.8; Platelets 333; Potassium 3.5; Sodium 139      Wt Readings from Last 3 Encounters:  10/20/20 149 lb (67.6 kg)  09/12/20 145 lb (65.8 kg)  05/06/20 140 lb (63.5 kg)      Other studies Reviewed: Additional studies/ records that were reviewed today include:   Outside records including the MRI and echo Review of the above records demonstrates:   See above   ASSESSMENT AND PLAN:  ELEVATED CORONARY CALCIUM:    He had mild plaque on catheterization in the past.  He has had no new chest pain.  No change in therapy is indicated.  PULMONIC REGURGITATION:      This was moderate last year on echo.  This was stable by the report of the echo done at [facility].  I will follow this up next year.  MM: He has been treated with daratumumab/dexamethasone.  There was no evidence of amyloidosis involving his heart.  There is a little less than 10% risk of atrial fibrillation.  He has had no suggestion of any arrhythmias.  He has had a low normal ejection fraction I will follow  this up as above next year.   Current medicines are reviewed at length with the patient today.  The patient does not have concerns regarding medicines.  PREOP: The patient is likely to be having ankle surgery.  I think he would be at acceptable risk for this surgery if it is in the next 6 months or so.  The following changes have been made:  None  Labs/ tests ordered today include:   None  Orders Placed This Encounter  Procedures   EKG 12-Lead  Disposition:   FU with me in one year. Ronnell Guadalajara, MD  10/20/2020 1:56 PM    [facility]

## 2020-10-20 ENCOUNTER — Other Ambulatory Visit: Payer: Self-pay

## 2020-10-20 ENCOUNTER — Ambulatory Visit (INDEPENDENT_AMBULATORY_CARE_PROVIDER_SITE_OTHER): Payer: Medicare Other | Admitting: Cardiology

## 2020-10-20 ENCOUNTER — Encounter: Payer: Self-pay | Admitting: Cardiology

## 2020-10-20 VITALS — BP 124/68 | HR 72 | Ht 66.0 in | Wt 149.0 lb

## 2020-10-20 DIAGNOSIS — R931 Abnormal findings on diagnostic imaging of heart and coronary circulation: Secondary | ICD-10-CM

## 2020-10-20 DIAGNOSIS — I371 Nonrheumatic pulmonary valve insufficiency: Secondary | ICD-10-CM | POA: Diagnosis not present

## 2020-10-20 NOTE — Patient Instructions (Signed)
Medication Instructions:  No changes *If you need a refill on your cardiac medications before your next appointment, please call your pharmacy*   Lab Work: None ordered If you have labs (blood work) drawn today and your tests are completely normal, you will receive your results only by: [place] (if you have MyChart) OR A paper copy in the mail If you have any lab test that is abnormal or we need to change your treatment, we will call you to review the results.   Testing/Procedures: None ordered   Follow-Up: At [facility], you and your health needs are our priority.  As part of our continuing mission to provide you with exceptional heart care, we have created designated Provider Care Teams.  These Care Teams include your primary Cardiologist (physician) and Advanced Practice Providers (APPs -  Physician Assistants and Nurse Practitioners) who all work together to provide you with the care you need, when you need it.  We recommend signing up for the patient portal called "MyChart".  Sign up information is provided on this After Visit Summary.  MyChart is used to connect with patients for Virtual Visits (Telemedicine).  Patients are able to view lab/test results, encounter notes, upcoming appointments, etc.  Non-urgent messages can be sent to your provider as well.   To learn more about what you can do with MyChart, go to [email or web address].    Your next appointment:   12 month(s)  The format for your next appointment:   In Person  Provider:   You may see Dr. Percival Spanish or one of the following Advanced Practice Providers on your designated Care Team:   Rosaria Ferries, PA-C Caron Presume, PA-C Jory Sims, DNP, ANP

## 2020-10-21 ENCOUNTER — Telehealth: Payer: Self-pay | Admitting: Orthopedic Surgery

## 2020-10-21 NOTE — Telephone Encounter (Signed)
This pt called requesting a call back from [facility] about his surgery date. He states if he doesn't hear anything today he is going to a [facility] for surgery. Pt phone number is [phone].

## 2020-10-30 ENCOUNTER — Telehealth: Payer: Self-pay | Admitting: Neurology

## 2020-10-30 NOTE — Telephone Encounter (Signed)
Pt has scheduled an annual f/u & is on wait list, he'd like Dr Felecia Shelling to call in for him enough of the medication to calm him until his appointment date.  Pt does not recall the name of the medication.  He would like it called into [facility]

## 2020-10-30 NOTE — Telephone Encounter (Signed)
Called and spoke with pt. Advised I reviewed last OV note and Dr. Felecia Shelling was prescribing primidone '50mg'$  po BID for him for essential tremor. He asked about something for anxiety. Advised he should follow up with primary care physician for treatment of anxiety. He verbalized understanding. He also asked about sooner appt than 12/02/20. Advised there are no sooner appt currently, he is added to wait list. He will also call periodically to check for cx.   Confirmed he is also following with doctor at [facility] still but he wanted to follow up w/ [facility] again.

## 2020-11-25 ENCOUNTER — Other Ambulatory Visit (HOSPITAL_BASED_OUTPATIENT_CLINIC_OR_DEPARTMENT_OTHER): Payer: Self-pay

## 2020-12-02 ENCOUNTER — Ambulatory Visit: Payer: Medicare Other | Admitting: Neurology

## 2020-12-04 ENCOUNTER — Other Ambulatory Visit: Payer: Self-pay | Admitting: Urology

## 2020-12-04 ENCOUNTER — Other Ambulatory Visit: Payer: Self-pay

## 2020-12-04 ENCOUNTER — Emergency Department (HOSPITAL_COMMUNITY): Payer: Medicare Other

## 2020-12-04 ENCOUNTER — Emergency Department (HOSPITAL_COMMUNITY)
Admission: EM | Admit: 2020-12-04 | Discharge: 2020-12-04 | Disposition: A | Discharge status: Home / Self Care | Payer: Medicare Other | Attending: Emergency Medicine | Admitting: Emergency Medicine

## 2020-12-04 ENCOUNTER — Encounter (HOSPITAL_COMMUNITY): Payer: Self-pay

## 2020-12-04 DIAGNOSIS — Z79899 Other long term (current) drug therapy: Secondary | ICD-10-CM | POA: Insufficient documentation

## 2020-12-04 DIAGNOSIS — R109 Unspecified abdominal pain: Secondary | ICD-10-CM | POA: Diagnosis present

## 2020-12-04 DIAGNOSIS — Z87891 Personal history of nicotine dependence: Secondary | ICD-10-CM | POA: Insufficient documentation

## 2020-12-04 DIAGNOSIS — Z85828 Personal history of other malignant neoplasm of skin: Secondary | ICD-10-CM | POA: Diagnosis not present

## 2020-12-04 DIAGNOSIS — Z8616 Personal history of COVID-19: Secondary | ICD-10-CM | POA: Diagnosis not present

## 2020-12-04 DIAGNOSIS — N201 Calculus of ureter: Secondary | ICD-10-CM

## 2020-12-04 DIAGNOSIS — Z8546 Personal history of malignant neoplasm of prostate: Secondary | ICD-10-CM | POA: Insufficient documentation

## 2020-12-04 DIAGNOSIS — R1011 Right upper quadrant pain: Secondary | ICD-10-CM

## 2020-12-04 LAB — URINALYSIS, ROUTINE W REFLEX MICROSCOPIC
Bacteria, UA: NONE SEEN
Bilirubin Urine: NEGATIVE
Glucose, UA: >1000 mg/dL — AB
Ketones, ur: NEGATIVE mg/dL
Leukocytes,Ua: NEGATIVE
Nitrite: NEGATIVE
RBC / HPF: >50 RBC/hpf — ABNORMAL HIGH (ref 0–5)
Specific Gravity, Urine: 1.015 (ref 1.005–1.030)
pH: 6.5 (ref 5.0–8.0)

## 2020-12-04 LAB — CBC WITH DIFFERENTIAL/PLATELET
Abs Immature Granulocytes: 0.03 10*3/uL (ref 0.00–0.07)
Basophils Absolute: 0.1 10*3/uL (ref 0.0–0.1)
Basophils Relative: 1 %
Eosinophils Absolute: 0.1 10*3/uL (ref 0.0–0.5)
Eosinophils Relative: 2 %
HCT: 43.9 % (ref 39.0–52.0)
Hemoglobin: 13.8 g/dL (ref 13.0–17.0)
Immature Granulocytes: 0 %
Lymphocytes Relative: 6 %
Lymphs Abs: 0.5 10*3/uL — ABNORMAL LOW (ref 0.7–4.0)
MCH: 30.5 pg (ref 26.0–34.0)
MCHC: 31.4 g/dL (ref 30.0–36.0)
MCV: 96.9 fL (ref 80.0–100.0)
Monocytes Absolute: 0.5 10*3/uL (ref 0.1–1.0)
Monocytes Relative: 7 %
Neutro Abs: 6.5 10*3/uL (ref 1.7–7.7)
Neutrophils Relative %: 84 %
Platelets: 300 10*3/uL (ref 150–400)
RBC: 4.53 MIL/uL (ref 4.22–5.81)
RDW: 15.4 % (ref 11.5–15.5)
WBC: 7.7 10*3/uL (ref 4.0–10.5)
nRBC: 0 % (ref 0.0–0.2)

## 2020-12-04 LAB — COMPREHENSIVE METABOLIC PANEL
ALT: 15 U/L (ref 0–44)
AST: 19 U/L (ref 15–41)
Albumin: 3.8 g/dL (ref 3.5–5.0)
Alkaline Phosphatase: 102 U/L (ref 38–126)
Anion gap: 8 (ref 5–15)
BUN: 17 mg/dL (ref 8–23)
CO2: 28 mmol/L (ref 22–32)
Calcium: 9.6 mg/dL (ref 8.9–10.3)
Chloride: 103 mmol/L (ref 98–111)
Creatinine, Ser: 1.12 mg/dL (ref 0.61–1.24)
GFR, Estimated: >60 mL/min (ref >60)
Glucose, Bld: 311 mg/dL — ABNORMAL HIGH (ref 70–99)
Potassium: 4.7 mmol/L (ref 3.5–5.1)
Sodium: 139 mmol/L (ref 135–145)
Total Bilirubin: 0.7 mg/dL (ref 0.3–1.2)
Total Protein: 6.6 g/dL (ref 6.5–8.1)

## 2020-12-04 LAB — TROPONIN I (HIGH SENSITIVITY)
Troponin I (High Sensitivity): 52 ng/L — ABNORMAL HIGH (ref <18)
Troponin I (High Sensitivity): 54 ng/L — ABNORMAL HIGH (ref <18)

## 2020-12-04 LAB — CBG MONITORING, ED
Glucose-Capillary: 279 mg/dL — ABNORMAL HIGH (ref 70–99)
Glucose-Capillary: 264 mg/dL — ABNORMAL HIGH (ref 70–99)

## 2020-12-04 LAB — LIPASE, BLOOD: Lipase: 34 U/L (ref 11–51)

## 2020-12-04 MED ORDER — HYDROCODONE-ACETAMINOPHEN 5-325 MG PO TABS: 2.0000 | ORAL_TABLET | ORAL | 0 refills | Status: DC | PRN

## 2020-12-04 MED ORDER — ONDANSETRON HCL 4 MG/2ML IJ SOLN
4.0000 mg | Freq: Once | INTRAMUSCULAR | Status: AC
  Administered 2020-12-04: 4 mg via INTRAVENOUS
  Filled 2020-12-04: qty 2

## 2020-12-04 MED ORDER — IOHEXOL 350 MG/ML SOLN
100.0000 mL | Freq: Once | INTRAVENOUS | Status: AC | PRN
  Administered 2020-12-04: 100 mL via INTRAVENOUS

## 2020-12-04 MED ORDER — INSULIN ASPART 100 UNIT/ML IJ SOLN
8.0000 [IU] | Freq: Once | INTRAMUSCULAR | Status: AC
  Administered 2020-12-04: 8 [IU] via SUBCUTANEOUS
  Filled 2020-12-04: qty 0.08

## 2020-12-04 MED ORDER — TAMSULOSIN HCL 0.4 MG PO CAPS: 0.4000 mg | ORAL_CAPSULE | Freq: Every day | ORAL | 0 refills | Status: DC

## 2020-12-04 MED ORDER — ONDANSETRON 4 MG PO TBDP: 4.0000 mg | ORAL_TABLET | Freq: Three times a day (TID) | ORAL | 0 refills | Status: DC | PRN

## 2020-12-04 MED ORDER — FENTANYL CITRATE PF 50 MCG/ML IJ SOSY
25.0000 ug | PREFILLED_SYRINGE | Freq: Once | INTRAMUSCULAR | Status: AC
  Administered 2020-12-04: 25 ug via INTRAVENOUS
  Filled 2020-12-04: qty 1

## 2020-12-04 NOTE — ED Triage Notes (Signed)
Pt said that his pain has decreased to a 3 on the pain scale, it has eased dramatically

## 2020-12-04 NOTE — Discharge Instructions (Addendum)
As we discussed your pain is coming from a large kidney stone on the right side.  Take the pain and nausea medication as prescribed and follow-up with the urologist and Dr. Saul Fordyce office.  Return to the ED with worsening pain, fever, unable to urinate or any other concerns.  Your CT scan showed some incidental findings regarding your pancreas and lymph nodes which are likely due to your amyloidosis but your oncologist should follow these findings up. This should be checked again in 6 months or could possibly be biopsied. Report is pasted here for your reference.  IMPRESSION: 1. Negative for aortic aneurysm or dissection. Positive for extensive Aortic Atherosclerosis (ICD10-I70.0).   2. Positive for Acute Obstructive Uropathy on the right with an oblong 7-8 mm proximal right ureteral calculus, located about 3 cm from the right UPJ.   3. Positive also for a new 4.5 cm round Cystic Mass in the head of the Pancreas since December. This is indeterminate, occurring in a background of severe chronic dystrophic calcification of the pancreatic parenchyma which is in a pattern not typical of inflammatory calcific pancreatitis, but might instead be related to amyloidosis (see #4). Management strategies include surveillance imaging with CT or MRI q-6 months versus Endoscopic Ultrasound/FNA. This recommendation follows ACR consensus guidelines: Management of Incidental Pancreatic Cysts: A White Paper of the ACR Incidental Findings Committee. J Am Coll Radiol 1308;65:784-696.   4. Bulky chronic mediastinal lymphadenopathy, with coarse calcification of the nodal parenchyma, as well as chronic axillary, retroperitoneal, and inguinal lymph nodes. The patient carries a diagnosis of amyloidosis. And sarcoidosis (see also #5) has been given as the main differential diagnosis for this appearance.   5. Numerous tiny bilateral pulmonary nodules, increased since 2015 but stable since February and likely  secondary to the process in #4.   6. Chronic prostatomegaly with new prostate surgical clips. Chronic fat containing left inguinal hernia.   7. Calcified coronary artery atherosclerosis.

## 2020-12-04 NOTE — ED Provider Notes (Signed)
[facility] DEPT Provider Note   CSN: [id number] Arrival date & time: 12/04/20  0137     History Chief Complaint  Patient presents with   Abdominal Pain    Pt complaining of abdominal pain that started about 1 am, woke him up, rt sided upper and lower, has galbladder but not appendix    William Barron is a 75 y.o. male.  )Patient with history of AL Amyloidosis/Myeloma on Daratumumab, Ixazomib, and Dexamethasone, kidney stones, prostate cancer, hyperlipidemia here with right-sided abdominal pain that started about 1 AM while he was try to go to sleep.  Pain is on the right upper and mid abdomen without radiation.  Is now mostly gone.  States it lasted about 3 hours.  He did not take anything for the pain.  Had nausea but no vomiting.  No diarrhea.  No chest pain or shortness of breath.  No cough, fever, runny nose or sore throat.  States he felt hot but not check his temperature.  No pain with urination or blood in the urine.  Has had kidney stones in the past and is not certain if this feels familiar. Still has gallbladder.  No appendix.  No testicle pain. No back pain or flank pain.  No radiation of pain down his legs.  He is due to have a PET scan in the morning.  He missed his chemotherapy last week but is being scheduled for next week   The history is provided by the patient and the EMS personnel.  Abdominal Pain Associated symptoms: nausea   Associated symptoms: no chest pain, no cough, no dysuria, no fever, no hematuria, no shortness of breath and no vomiting       Past Medical History:  Diagnosis Date   Amyloidosis ([place])    GERD (gastroesophageal reflux disease)    Hearing loss    Has hearing aids   History of kidney stones    Hyperlipidemia    Multiple myeloma ([place])    and amylodosis    Nephrolithiasis    Prostate cancer ([place])    Prostatitis    acute and chronic   Skin cancer     Patient Active Problem List   Diagnosis Date  Noted   Malignant neoplasm of prostate ([place]) 02/11/2020   Orthostatic tremor 10/01/2019   Educated about COVID-19 virus infection 09/12/2019   Dyslipidemia 09/12/2019   Nonrheumatic pulmonary valve insufficiency 09/12/2019   Red blood cell antibody positive, compatible PRBC difficult to obtain 02/15/2018   Diplopia 01/03/2018   Benign essential tremor 01/03/2018   Pain in joint of right ankle 05/17/2017   Foot pain 05/17/2017   Peroneal tendinitis 05/17/2017   Depression with anxiety 01/03/2017   Insomnia 05/05/2016   Coronary artery calcification seen on CAT scan    DOE (dyspnea on exertion)    Fatigue 02/27/2015   Elevated homocysteine 09/26/2014   Multiple myeloma ([place]) 09/26/2014   Acquired pes planus of right foot 07/02/2014   Posterior tibial tendon dysfunction 07/02/2014   Encounter for antineoplastic chemotherapy 01/02/2014   Kahler disease ([place]) 01/02/2014   Avitaminosis D 01/02/2014   Amyloidosis ([place]) 03/26/2013   Enlarged lymph node 02/14/2013   Pure hypercholesterolemia 03/29/2011   Memory disorder 09/21/2010   Labile hypertension 09/21/2010    Past Surgical History:  Procedure Laterality Date   APPENDECTOMY     CARDIAC CATHETERIZATION N/A 06/13/2015   Procedure: Left Heart Cath and Coronary Angiography;  Surgeon: Jettie Booze, MD;  Location: [facility];  Service: Cardiovascular;  Laterality: N/A;   COLONOSCOPY  04/07/00   ELBOW SURGERY  2003   right   EXTRACORPOREAL SHOCK WAVE LITHOTRIPSY Left 07/17/2018   Procedure: EXTRACORPOREAL SHOCK WAVE LITHOTRIPSY (ESWL);  Surgeon: [name], MD;  Location: [facility];  Service: Urology;  Laterality: Left;   HERNIA REPAIR     ROTATOR CUFF REPAIR         Family History  Problem Relation Age of Onset   Pancreatic cancer Father    Prostate cancer Father    Parkinsonism Mother    CAD Brother 27   Cancer Paternal Grandmother    Breast cancer Neg Hx    Colon cancer Neg Hx     Social History    Tobacco Use   Smoking status: Former    Packs/day: 3.00    Years: 20.00    Pack years: 60.00    Types: Cigarettes    Quit date: 03/15/1977    Years since quitting: 43.7   Smokeless tobacco: Never   Tobacco comments:    quit 35 years ago  Vaping Use   Vaping Use: Never used  Substance Use Topics   Alcohol use: Yes    Alcohol/week: 0.0 standard drinks    Comment: approx 1 drink/night   Drug use: No    Home Medications Prior to Admission medications   Medication Sig Start Date End Date Taking? Authorizing Provider  daratumumab [facility]) 100 MG/5ML Inject into the vein.    [provider]  dexamethasone (DECADRON) 2 MG tablet Take 4 mg by mouth daily.    [provider]  propranolol ER (INDERAL LA) 60 MG 24 hr capsule Take 60 mg by mouth daily.    [provider]  Testosterone 20.25 MG/ACT (1.62%) GEL APPLY 4 PUMPS TO SKIN IN THE MORNING (APPLY AFTER SHOWER OR BATH TO CLEAN, DRY, INTACT SKIN ON UPPER ARM OR SHOULDER AREAS ONLY) 06/13/20   [provider]  zolpidem (AMBIEN) 5 MG tablet  01/15/20   [provider]    Allergies    Penicillins  Review of Systems   Review of Systems  Constitutional:  Negative for activity change, appetite change and fever.  HENT:  Negative for congestion and rhinorrhea.   Respiratory:  Negative for cough, chest tightness and shortness of breath.   Cardiovascular:  Negative for chest pain.  Gastrointestinal:  Positive for abdominal pain and nausea. Negative for vomiting.  Genitourinary:  Negative for dysuria and hematuria.  Musculoskeletal:  Negative for arthralgias, back pain and myalgias.  Skin:  Negative for rash.  Neurological:  Negative for dizziness, weakness and headaches.   all other systems are negative except as noted in the HPI and PMH.   Physical Exam Updated Vital Signs BP (!) 181/82 (BP Location: Right Arm)   Pulse 70   Temp 98.1 F (36.7 C) (Oral)   Resp 18   Ht 5' 6" (1.676 m)    Wt 68.9 kg   SpO2 96%   BMI 24.53 kg/m   Physical Exam Vitals and nursing note reviewed.  Constitutional:      General: He is not in acute distress.    Appearance: He is well-developed.     Comments: Hard of hearing  HENT:     Head: Normocephalic and atraumatic.     Mouth/Throat:     Pharynx: No oropharyngeal exudate.  Eyes:     Conjunctiva/sclera: Conjunctivae normal.     Pupils: Pupils are equal, round, and reactive to  light.  Neck:     Comments: No meningismus. Cardiovascular:     Rate and Rhythm: Normal rate and regular rhythm.     Heart sounds: Normal heart sounds. No murmur heard. Pulmonary:     Effort: Pulmonary effort is normal. No respiratory distress.     Breath sounds: Normal breath sounds.  Abdominal:     Palpations: Abdomen is soft.     Tenderness: There is abdominal tenderness. There is no guarding or rebound.     Comments: TTP RUQ and RLQ. No guarding or rebound  Musculoskeletal:        General: No tenderness. Normal range of motion.     Cervical back: Normal range of motion and neck supple.     Comments: No CVAT  Skin:    General: Skin is warm.  Neurological:     Mental Status: He is alert and oriented to person, place, and time.     Cranial Nerves: No cranial nerve deficit.     Motor: No abnormal muscle tone.     Coordination: Coordination normal.     Comments:  5/5 strength throughout. CN 2-12 intact.Equal grip strength.   Psychiatric:        Behavior: Behavior normal.    ED Results / Procedures / Treatments   Labs (all labs ordered are listed, but only abnormal results are displayed) Labs Reviewed  CBC WITH DIFFERENTIAL/PLATELET - Abnormal; Notable for the following components:      Result Value   Lymphs Abs 0.5 (*)    All other components within normal limits  COMPREHENSIVE METABOLIC PANEL - Abnormal; Notable for the following components:   Glucose, Bld 311 (*)    All other components within normal limits  URINALYSIS, ROUTINE W REFLEX  MICROSCOPIC - Abnormal; Notable for the following components:   Glucose, UA >1,000 (*)    Hgb urine dipstick LARGE (*)    Protein, ur TRACE (*)    RBC / HPF >50 (*)    All other components within normal limits  CBG MONITORING, ED - Abnormal; Notable for the following components:   Glucose-Capillary 279 (*)    All other components within normal limits  TROPONIN I (HIGH SENSITIVITY) - Abnormal; Notable for the following components:   Troponin I (High Sensitivity) 52 (*)    All other components within normal limits  TROPONIN I (HIGH SENSITIVITY) - Abnormal; Notable for the following components:   Troponin I (High Sensitivity) 54 (*)    All other components within normal limits  LIPASE, BLOOD    EKG EKG Interpretation  Date/Time:  Thursday December 04 2020 02:21:49 EDT Ventricular Rate:  82 PR Interval:  186 QRS Duration: 89 QT Interval:  351 QTC Calculation: 410 R Axis:   27 Text Interpretation: Incomplete analysis due to missing data in precordial lead(s) Sinus rhythm Ventricular premature complex Probable left atrial enlargement Missing lead(s): V2 Artifact Confirmed by Ezequiel Essex [phone]) on 12/04/2020 2:49:54 AM  Radiology CT Angio Chest/Abd/Pel for Dissection W and/or Wo Contrast  Result Date: 12/04/2020 CLINICAL DATA:  75 year old male with abdominal pain. History of prostate cancer, amyloidosis. EXAM: CT ANGIOGRAPHY CHEST, ABDOMEN AND PELVIS TECHNIQUE: Non-contrast CT of the chest was initially obtained. Multidetector CT imaging through the chest, abdomen and pelvis was performed using the standard protocol during bolus administration of intravenous contrast. Multiplanar reconstructed images and MIPs were obtained and reviewed to evaluate the vascular anatomy. CONTRAST:  121m OMNIPAQUE IOHEXOL 350 MG/ML SOLN COMPARISON:  CTA chest 05/06/2020. CT Abdomen and Pelvis  03/05/2020. FINDINGS: CTA CHEST FINDINGS Cardiovascular: Calcified aortic atherosclerosis. Calcified coronary  artery atherosclerosis (series 4, image 28). Negative for thoracic aortic aneurysm or dissection. Proximal great vessels appear patent. Stable cardiac size at the upper limits of normal. No pericardial effusion. Central pulmonary arteries are also well enhanced and appear to be patent. Motion artifact degrades bilateral pulmonary artery branches. Mediastinum/Nodes: Bulky chronic mediastinal lymphadenopathy with scattered coarse calcification of the nodal tissue, stable and size and configuration since February, and chronic although progressed since 2015. a small number of subcentimeter coarsely calcified bilateral axillary lymph nodes are also chronic. Lungs/Pleura: Larger lung volumes compared to February. Major airways remain patent. Widespread tiny pulmonary nodules distributed throughout both lungs again noted and superimposed on chronic lung base scarring and/or atelectasis. And bilateral middle lobe opacity is coarsely calcified. No pleural effusion. Musculoskeletal: No acute or suspicious osseous lesion in the chest. Chronic right posterior 10th rib fracture. Review of the MIP images confirms the above findings. CTA ABDOMEN AND PELVIS FINDINGS VASCULAR Calcified aortic atherosclerosis. Negative for abdominal aortic aneurysm or dissection. Extensive calcified atherosclerosis in the proximal iliac arteries also. The major abdominal aortic branches remain patent, with moderate generalized atherosclerosis. Proximal femoral arteries are patent with atherosclerosis. Review of the MIP images confirms the above findings. NON-VASCULAR Hepatobiliary: Contracted, normal gallbladder. Liver remains within normal limits. Pancreas: Widespread coarse calcification of pancreatic parenchyma has progressed since last year and is not in the typical configuration of chronic calcific pancreatitis. Furthermore, a round cystic mass measuring up to 45 mm diameter has developed in the head of the pancreas since December (series 6,  image 99 and series 8, image 59). Internally the lesion has fairly simple fluid density. There is no surrounding inflammation. But there is regional mass effect, including on the underlying pancreatic parenchyma and adjacent duodenum. No pancreatic ductal dilatation. No new or increased pancreatic lymphadenopathy. Spleen: Stable, negative. Adrenals/Urinary Tract: Adrenal glands remain normal. Symmetric renal enhancement. However, there is new right hydronephrosis with an oblong 7-8 mm obstructing proximal right ureteral calculus demonstrated on coronal image 80 located about 3 cm from the right ureteropelvic junction. There is right pararenal edema, trace fluid. Distal to the stone the right ureter is decompressed. No other right renal calculus, but punctate 4 mm left renal calculi are noted. Left ureter is within normal limits to the bladder. The bladder is mildly distended but otherwise unremarkable. Stomach/Bowel: Large bowel is generally decompressed throughout the abdomen and pelvis. There is low-density retained stool in the transverse colon. The sigmoid is redundant and partially gas distended. Appendix is not identified and may be diminutive or absent. No large bowel inflammation is identified. No dilated small bowel. Fluid-filled stomach. Duodenum seems to remain normal despite the abnormal pancreas. No free air. No free fluid. No mesenteric inflammation identified. Lymphatic: Chronic coarsely calcified retroperitoneal and inguinal lymph nodes are generally stable and size and configuration since last year. Reproductive: Chronic prostatomegaly. New surgical clips scattered in the prostate. Chronic fat containing left inguinal hernia is stable from last year. Other: No pelvic free fluid. There is mild presacral stranding today. Musculoskeletal: Severe lower lumbar facet arthropathy is chronic. No acute osseous abnormality identified. Review of the MIP images confirms the above findings. IMPRESSION: 1.  Negative for aortic aneurysm or dissection. Positive for extensive Aortic Atherosclerosis (ICD10-I70.0). 2. Positive for Acute Obstructive Uropathy on the right with an oblong 7-8 mm proximal right ureteral calculus, located about 3 cm from the right UPJ. 3. Positive also for a new 4.5  cm round Cystic Mass in the head of the Pancreas since December. This is indeterminate, occurring in a background of severe chronic dystrophic calcification of the pancreatic parenchyma which is in a pattern not typical of inflammatory calcific pancreatitis, but might instead be related to amyloidosis (see #4). Management strategies include surveillance imaging with CT or MRI q-6 months versus Endoscopic Ultrasound/FNA. This recommendation follows ACR consensus guidelines: Management of Incidental Pancreatic Cysts: A White Paper of the ACR Incidental Findings Committee. J Am Coll Radiol 7579;72:820-601. 4. Bulky chronic mediastinal lymphadenopathy, with coarse calcification of the nodal parenchyma, as well as chronic axillary, retroperitoneal, and inguinal lymph nodes. The patient carries a diagnosis of amyloidosis. And sarcoidosis (see also #5) has been given as the main differential diagnosis for this appearance. 5. Numerous tiny bilateral pulmonary nodules, increased since 2015 but stable since February and likely secondary to the process in #4. 6. Chronic prostatomegaly with new prostate surgical clips. Chronic fat containing left inguinal hernia. 7. Calcified coronary artery atherosclerosis. Electronically Signed   By: Genevie Ann M.D.   On: 12/04/2020 04:28   US Abdomen Limited RUQ (LIVER/GB)  Result Date: 12/04/2020 CLINICAL DATA:  Initial evaluation for right upper quadrant pain. EXAM: ULTRASOUND ABDOMEN LIMITED RIGHT UPPER QUADRANT COMPARISON:  Prior CT from earlier the same day. FINDINGS: Gallbladder: No gallstones or wall thickening visualized. No sonographic Murphy sign noted by sonographer. Common bile duct: Diameter: 5  mm Liver: No focal lesion identified. Within normal limits in parenchymal echogenicity. Portal vein is patent on color Doppler imaging with normal direction of blood flow towards the liver. Other: None. IMPRESSION: Normal right upper quadrant ultrasound. No cholelithiasis, acute cholecystitis, or biliary dilatation. Electronically Signed   By: Jeannine Boga M.D.   On: 12/04/2020 04:54    Procedures Procedures   Medications Ordered in ED Medications  fentaNYL (SUBLIMAZE) injection 25 mcg (has no administration in time range)  ondansetron (ZOFRAN) injection 4 mg (has no administration in time range)    ED Course  I have reviewed the triage vital signs and the nursing notes.  Pertinent labs & imaging results that were available during my care of the patient were reviewed by me and considered in my medical decision making (see chart for details).    MDM Rules/Calculators/A&P                           Patient with history of kidney stones here with right-sided abdominal pain with nausea now resolved.  Vital stable, no distress  Patient's pain has resolved and he is required no further pain medications.  Urinalysis shows hematuria without infection. EKG is unchanged.  Troponin mildly elevated but flat.  Low suspicion for ACS. Ultrasound was obtained which shows normal gallbladder.  Normal LFTs and lipase.  CT scan is obtained which shows no acute vascular pathology but does show proximal right-sided ureteral stone of approximately 8 mm which is oblong shaped.  Patient and wife made aware of pancreas finding and need for follow-up with his oncologist.  Kidney stone was discussed with Dr. Matilde Sprang of urology given its size and proximal location.  Patient has no evidence of infection of his urinalysis and no fever.  Does have hyperglycemia which he was been told is secondary to steroid use but no evidence of DKA. Patient did have a milkshake before bed last night. Patient's wife states  sugars have been in the 150-180 range. He has not had steroids in 4 weeks. They  have an endocrinologist in [place] but would like one more locally.    Patient comfortable.  Discussed that he needs to follow-up with urology for likely intervention of his kidney stone.  He does not have any pain medication at home will given a short course of pain medication.  Discussed return to the ED with worsening pain, fever, vomiting, not able to urinate or other concerns.  Follow-up with his oncologist regarding the pancreatic cyst needs follow-up in 6 months versus biopsy.  Extensive discussion of return precautions with patient and his wife at bedside.  They requested referral to local endocrinology which was given to them.  Follow-up with urology regarding his presenting issue tonight which was a large kidney stone. Return to the ED with intractable pain, vomiting, fever, able to urinate or any other concerns. Final Clinical Impression(s) / ED Diagnoses Final diagnoses:  RUQ pain  Ureteral stone    Rx / DC Orders ED Discharge Orders     None        , Annie Main, MD 12/04/20 [phone]

## 2020-12-05 NOTE — Progress Notes (Signed)
Talked with patient. Hx and meds reviewed. New onset of diabetes. In ER  sugar was 311 Insulin was given. To follow up with primary for DM. Arrival time 0600 cl liquids until 0330. Wife is the driver

## 2020-12-08 ENCOUNTER — Encounter (HOSPITAL_BASED_OUTPATIENT_CLINIC_OR_DEPARTMENT_OTHER)
Admission: RE | Disposition: A | Discharge status: Home / Self Care | Payer: Self-pay | Source: Home / Self Care | Attending: Urology

## 2020-12-08 ENCOUNTER — Encounter (HOSPITAL_BASED_OUTPATIENT_CLINIC_OR_DEPARTMENT_OTHER): Payer: Self-pay | Admitting: Urology

## 2020-12-08 ENCOUNTER — Encounter (HOSPITAL_BASED_OUTPATIENT_CLINIC_OR_DEPARTMENT_OTHER): Payer: Self-pay | Admitting: Anesthesiology

## 2020-12-08 ENCOUNTER — Ambulatory Visit (HOSPITAL_BASED_OUTPATIENT_CLINIC_OR_DEPARTMENT_OTHER)
Admission: RE | Admit: 2020-12-08 | Discharge: 2020-12-08 | Disposition: A | Discharge status: Home / Self Care | Payer: Medicare Other | Attending: Urology | Admitting: Urology

## 2020-12-08 ENCOUNTER — Ambulatory Visit (HOSPITAL_COMMUNITY): Payer: Medicare Other

## 2020-12-08 ENCOUNTER — Other Ambulatory Visit: Payer: Self-pay

## 2020-12-08 DIAGNOSIS — Z79899 Other long term (current) drug therapy: Secondary | ICD-10-CM | POA: Diagnosis not present

## 2020-12-08 DIAGNOSIS — Z79890 Hormone replacement therapy (postmenopausal): Secondary | ICD-10-CM | POA: Insufficient documentation

## 2020-12-08 DIAGNOSIS — Z87891 Personal history of nicotine dependence: Secondary | ICD-10-CM | POA: Insufficient documentation

## 2020-12-08 DIAGNOSIS — Z880 Allergy status to penicillin: Secondary | ICD-10-CM | POA: Diagnosis not present

## 2020-12-08 DIAGNOSIS — E119 Type 2 diabetes mellitus without complications: Secondary | ICD-10-CM | POA: Insufficient documentation

## 2020-12-08 DIAGNOSIS — Z85820 Personal history of malignant melanoma of skin: Secondary | ICD-10-CM | POA: Insufficient documentation

## 2020-12-08 DIAGNOSIS — Z923 Personal history of irradiation: Secondary | ICD-10-CM | POA: Diagnosis not present

## 2020-12-08 DIAGNOSIS — Z8546 Personal history of malignant neoplasm of prostate: Secondary | ICD-10-CM | POA: Insufficient documentation

## 2020-12-08 DIAGNOSIS — Z8579 Personal history of other malignant neoplasms of lymphoid, hematopoietic and related tissues: Secondary | ICD-10-CM | POA: Insufficient documentation

## 2020-12-08 DIAGNOSIS — N132 Hydronephrosis with renal and ureteral calculous obstruction: Secondary | ICD-10-CM | POA: Insufficient documentation

## 2020-12-08 DIAGNOSIS — N201 Calculus of ureter: Secondary | ICD-10-CM

## 2020-12-08 HISTORY — PX: EXTRACORPOREAL SHOCK WAVE LITHOTRIPSY: SHX1557

## 2020-12-08 LAB — GLUCOSE, CAPILLARY
Glucose-Capillary: 146 mg/dL — ABNORMAL HIGH (ref 70–99)
Glucose-Capillary: 121 mg/dL — ABNORMAL HIGH (ref 70–99)

## 2020-12-08 SURGERY — LITHOTRIPSY, ESWL
Anesthesia: LOCAL | Laterality: Right

## 2020-12-08 MED ORDER — DIAZEPAM 5 MG PO TABS
10.0000 mg | ORAL_TABLET | ORAL | Status: AC
  Administered 2020-12-08: 10 mg via ORAL

## 2020-12-08 MED ORDER — DIPHENHYDRAMINE HCL 25 MG PO CAPS
ORAL_CAPSULE | ORAL | Status: AC
  Filled 2020-12-08: qty 1

## 2020-12-08 MED ORDER — HYDROCODONE-ACETAMINOPHEN 5-325 MG PO TABS: 1.0000 | ORAL_TABLET | Freq: Four times a day (QID) | ORAL | 0 refills | Status: DC | PRN

## 2020-12-08 MED ORDER — DIAZEPAM 5 MG PO TABS
ORAL_TABLET | ORAL | Status: AC
  Filled 2020-12-08: qty 1

## 2020-12-08 MED ORDER — SODIUM CHLORIDE 0.9 % IV SOLN: INTRAVENOUS | Status: DC

## 2020-12-08 MED ORDER — DIPHENHYDRAMINE HCL 25 MG PO CAPS
25.0000 mg | ORAL_CAPSULE | ORAL | Status: AC
  Administered 2020-12-08: 25 mg via ORAL

## 2020-12-08 MED ORDER — CIPROFLOXACIN HCL 500 MG PO TABS
500.0000 mg | ORAL_TABLET | ORAL | Status: AC
  Administered 2020-12-08: 500 mg via ORAL

## 2020-12-08 MED ORDER — CIPROFLOXACIN HCL 500 MG PO TABS
ORAL_TABLET | ORAL | Status: AC
  Filled 2020-12-08: qty 1

## 2020-12-08 MED ORDER — DIAZEPAM 5 MG PO TABS
ORAL_TABLET | ORAL | Status: AC
  Filled 2020-12-08: qty 2

## 2020-12-08 NOTE — Op Note (Signed)
See [facility] operative note scanned into chart. Also because of the size, density, location and other factors that cannot be anticipated I feel this will likely be a staged procedure. This fact supersedes any indication in the scanned [facility] operative note to the contrary.  

## 2020-12-08 NOTE — H&P (Signed)
Office Visit Report     12/04/2020   --------------------------------------------------------------------------------   [name]  MRN: [id number]  DOB: [birth year], 75 year old Male  SSN: [id number]   PRIMARY CARE:  [name], MD  REFERRING:  [name], MD  PROVIDER:  [name], M.D.  TREATING:  Harold Barban, M.D.  LOCATION:  [facility] [phone]     --------------------------------------------------------------------------------   CC/HPI: William Barron returns today in f/u for his history of Gleason 8 prostate cancer and is about 6 weeks out from completion of XRT. His PSA is down to 1.35. He is voiding ok with reduced nocturia. He has a chronically reduced stream that is stable. His IPSS is 19 which is improved.   He remains on TRT with androgel 3-4 pumps qam and his testosterone level is normal and his energy level has improved. His Hgb is normal.   -12/04/20-patient history of adenocarcinoma the prostate status post XRT as above. Presented to the emergency room with acute onset of right-sided back and abdominal discomfort late last night. CT scan was performed with initial thought that he might be having a aortic dissection. On evaluation was found to have a 7 8 mm right proximal ureteral calculus with hydronephrosis. Patient was discharged home and is now here for follow-up of right ureteral calculus. Currently the patient is asymptomatic. He has not required any oral pain medicine he has had no nausea vomiting or fever.  KUB is performed today and shows some persistence of contrast from CT scan with columning of contrast to the level of what appears to be a stone at L3. Review of these pre CT scan shows visualization of the stone.    CLINICAL DATA: 75 year old male with abdominal pain. History of  prostate cancer, amyloidosis.   EXAM:  CT ANGIOGRAPHY CHEST, ABDOMEN AND PELVIS   TECHNIQUE:  Non-contrast CT of the chest was initially obtained.    Multidetector CT imaging through the chest, abdomen and pelvis was  performed using the standard protocol during bolus administration of  intravenous contrast. Multiplanar reconstructed images and MIPs were  obtained and reviewed to evaluate the vascular anatomy.   CONTRAST: 129m OMNIPAQUE IOHEXOL 350 MG/ML SOLN   COMPARISON: CTA chest 05/06/2020. CT Abdomen and Pelvis 03/05/2020.   FINDINGS:  CTA CHEST FINDINGS   Cardiovascular: Calcified aortic atherosclerosis. Calcified coronary  artery atherosclerosis (series 4, image 28). Negative for thoracic  aortic aneurysm or dissection. Proximal great vessels appear patent.  Stable cardiac size at the upper limits of normal. No pericardial  effusion. Central pulmonary arteries are also well enhanced and  appear to be patent. Motion artifact degrades bilateral pulmonary  artery branches.   Mediastinum/Nodes: Bulky chronic mediastinal lymphadenopathy with  scattered coarse calcification of the nodal tissue, stable and size  and configuration since February, and chronic although progressed  since 2015. a small number of subcentimeter coarsely calcified  bilateral axillary lymph nodes are also chronic.   Lungs/Pleura: Larger lung volumes compared to February. Major  airways remain patent. Widespread tiny pulmonary nodules distributed  throughout both lungs again noted and superimposed on chronic lung  base scarring and/or atelectasis. And bilateral middle lobe opacity  is coarsely calcified. No pleural effusion.   Musculoskeletal: No acute or suspicious osseous lesion in the chest.  Chronic right posterior 10th rib fracture.   Review of the MIP images confirms the above findings.   CTA ABDOMEN AND PELVIS FINDINGS   VASCULAR   Calcified aortic atherosclerosis. Negative for abdominal  aortic  aneurysm or dissection. Extensive calcified atherosclerosis in the  proximal iliac arteries also. The major abdominal aortic branches  remain  patent, with moderate generalized atherosclerosis.   Proximal femoral arteries are patent with atherosclerosis.   Review of the MIP images confirms the above findings.   NON-VASCULAR   Hepatobiliary: Contracted, normal gallbladder. Liver remains within  normal limits.   Pancreas: Widespread coarse calcification of pancreatic parenchyma  has progressed since last year and is not in the typical  configuration of chronic calcific pancreatitis. Furthermore, a round  cystic mass measuring up to 45 mm diameter has developed in the head  of the pancreas since December (series 6, image 99 and series 8,  image 59). Internally the lesion has fairly simple fluid density.  There is no surrounding inflammation. But there is regional mass  effect, including on the underlying pancreatic parenchyma and  adjacent duodenum. No pancreatic ductal dilatation. No new or  increased pancreatic lymphadenopathy.   Spleen: Stable, negative.   Adrenals/Urinary Tract: Adrenal glands remain normal. Symmetric  renal enhancement. However, there is new right hydronephrosis with  an oblong 7-8 mm obstructing proximal right ureteral calculus  demonstrated on coronal image 80 located about 3 cm from the right  ureteropelvic junction. There is right pararenal edema, trace fluid.  Distal to the stone the right ureter is decompressed. No other right  renal calculus, but punctate 4 mm left renal calculi are noted. Left  ureter is within normal limits to the bladder. The bladder is mildly  distended but otherwise unremarkable.   Stomach/Bowel: Large bowel is generally decompressed throughout the  abdomen and pelvis. There is low-density retained stool in the  transverse colon. The sigmoid is redundant and partially gas  distended. Appendix is not identified and may be diminutive or  absent. No large bowel inflammation is identified. No dilated small  bowel. Fluid-filled stomach. Duodenum seems to remain normal despite   the abnormal pancreas. No free air. No free fluid. No mesenteric  inflammation identified.   Lymphatic: Chronic coarsely calcified retroperitoneal and inguinal  lymph nodes are generally stable and size and configuration since  last year.   Reproductive: Chronic prostatomegaly. New surgical clips scattered  in the prostate. Chronic fat containing left inguinal hernia is  stable from last year.   Other: No pelvic free fluid. There is mild presacral stranding  today.   Musculoskeletal: Severe lower lumbar facet arthropathy is chronic.  No acute osseous abnormality identified.   Review of the MIP images confirms the above findings.   IMPRESSION:  1. Negative for aortic aneurysm or dissection. Positive for  extensive Aortic Atherosclerosis (ICD10-I70.0).   2. Positive for Acute Obstructive Uropathy on the right with an  oblong 7-8 mm proximal right ureteral calculus, located about 3 cm  from the right UPJ.   3. Positive also for a new 4.5 cm round Cystic Mass in the head of  the Pancreas since December. This is indeterminate, occurring in a  background of severe chronic dystrophic calcification of the  pancreatic parenchyma which is in a pattern not typical of  inflammatory calcific pancreatitis, but might instead be related to  amyloidosis (see #4).  Management strategies include surveillance imaging with CT or MRI  q-6 months versus Endoscopic Ultrasound/FNA. This recommendation  follows ACR consensus guidelines: Management of Incidental  Pancreatic Cysts: A White Paper of the ACR Incidental Findings  Committee. J Am Coll Radiol 8182;99:371-696.   4. Bulky chronic mediastinal lymphadenopathy, with coarse  calcification of  the nodal parenchyma, as well as chronic axillary,  retroperitoneal, and inguinal lymph nodes. The patient carries a  diagnosis of amyloidosis. And sarcoidosis (see also #5) has been  given as the main differential diagnosis for this appearance.   5.  Numerous tiny bilateral pulmonary nodules, increased since 2015  but stable since February and likely secondary to the process in #4.   6. Chronic prostatomegaly with new prostate surgical clips. Chronic  fat containing left inguinal hernia.   7. Calcified coronary artery atherosclerosis.       ALLERGIES: Penicillins - Only redness per pt . 20 years ago.    MEDICATIONS: Androgel 20.25 mg/1.25 gram per actuation (1.62 %) gel in metered-dose pump 3-4 pumps qam  Ambien 5 mg tablet Oral  Ninjacof-Xg  Propranolol Hcl Er 60 mg capsule, extended release 24hr     GU PSH: Biopsy Skin Lesion - 2013 Cystoscopy Ureteroscopy - 2008 ESWL, Left - 2020 PLACE RT DEVICE/MARKER, PROS - 03/19/2020 Prostate Needle Biopsy - 12/11/2019 Remove Kidney Stone - 2008 Simple orchiectomy - 2008 Transperineal Plmt Biodegradable Matrl 1/Mlt Njx - 03/19/2020       PSH Notes: Rotator Cuff Repair, Rotator Cuff Repair, Biopsy Lymph Node, Biopsy Skin, Foot Surgery, Knee Surgery, Cystoscopy With Ureteroscopy, Orchiectomy Right, Lithotomy   NON-GU PSH: Biopsy/remove Lymph Nodes - 2015 Surgical Pathology, Gross And Microscopic Examination For Prostate Needle - 12/11/2019     GU PMH: BPH w/LUTS, His nocturia is improved but he has stable obstructive symptoms. - 08/01/2020, Benign prostatic hyperplasia with urinary obstruction, - 2017 Primary hypogonadism, His testosterone level is normal on current therapy. - 08/01/2020, testosterone is <10. , - 02/27/2020, His testosterone is down as expected and has reached a castrate level. I will continue to monitor this closely., - 01/16/2020, He is doing well on TRT but his level is a little low this visit. I will refill the med. , - 09/05/2019 (Stable), He is doing well on TRT and a refill was given. His Hct is borderline elevated but he gets that check every 4 weeks at [facility]. , - 03/05/2019 (Stable), His testosterone and H&H are normal on androgel. I will continue that pending further  evaluation of the PSA and prostate nodule. , - 2020, Hypogonadism, testicular, - 2017 Prostate Cancer, PSA is falling s/p RTx. f/u in 6 months with a PSA for exam. - 08/01/2020, - 03/19/2020, His PSA is coming down with a castrate T off of androgel. He is being evaluated for radiation therapy but staging studies are pending, but his last PET/CT was ok. We will need to get him scheduled for SpaceOAR and fiducial markers prior to RTx. I have reviewed the risks of bleeding, infection and difficulty voiding. , - 02/27/2020, He has T2 N0 M0 Gleason 8 prostate cancer with a history of smoldering myeloma. I have discussed treatment options including surveillance with testosterone and PSA monitoring since his testosterone level is castrate since stopping androgel. He is not the best surgical candidate at his age and with his comorbidities. For active therapy, I feel he would be best served by radiation therapy with either brachytherapy or EXRT depending on the opinion of Dr. Tammi Klippel. I have reviewed the risks of the radiation in some detail and discussed the use of SpaceOAR and fiducial markers. I discussed primary cryotherapy, but his prostate is a bit large for that and with the high grade disease I would not generaly recommend it for primary therapy. He will be set up to see Dr. Tammi Klippel for  a Rad Onc consultation and I will reach out to [name] for her comments on his prognosis with the MM and whether the current therapy might impact radiation side effects. , - 01/16/2020 Weak Urinary Stream - 08/01/2020 Elevated PSA, PSA down to 1.19 with Castrate T. - 02/27/2020, His PSA is down a point with testosterone withdrawal. I will continue to monitor that. , - 01/16/2020, - 12/11/2019, His PSA is up again and he has a stable right apical nodule. I am going to get him back for a prostate [place] and biopsy and reviewed the risks of bleeding, infection and voiding difficulty. Levaquin sent for the prep., - 09/05/2019, His PSA has  declined back to baseline. I have recommended a repeat in a year and then if stable no further testing. , - 2020, - 2018 Nocturia - 01/16/2020, Nocturia, - 2017 Prostate nodule w/ LUTS, His voiding symptoms have improved with testosterone withdrawal. - 01/16/2020, He has stable severe LUTS on silodosin. , - 2020 Urinary Frequency - 09/05/2019 Bladder Stone, He has some increase grit in the bladder and I discussed postural maneuvers to help clear the grit. He will have a KUB in 1 year. - 2020 Renal calculus, He has a small stable LLP stone. - 2020 Ureteral calculus, Left - 2020 ED due to arterial insufficiency, Erectile dysfunction due to arterial insufficiency - 2017 History of urolithiasis, History of kidney stones - 2017 Dysuria, Dysuria - 2015 Chronic prostatitis, Prostatitis, chronic - 2014 Other microscopic hematuria, Microscopic Hematuria - 2014 Personal Hx Oth male genital organs diseases, History of acute prostatitis - 2014 Testicular atrophy, Testicular atrophy - 2014      PMH Notes:  2011-03-22 15:11:02 - Note: Malignant Melanoma Of The Back, tremors   NON-GU PMH: Secondary polycythemia, His Hgb is normal. - 08/01/2020, - 03/05/2019 Personal history of other specified conditions, History of fever - 2017 Encounter for general adult medical examination without abnormal findings, Encounter for preventive health examination - 2016 Personal history of other malignant neoplasms of lymphoid, hematopoietic and related tissues, History of multiple myeloma - 2016 Organ-limited amyloidosis, Localized amyloidosis - 2015 Personal history of other endocrine, nutritional and metabolic disease, History of hypercholesterolemia - 2014 Diabetes Type 2    FAMILY HISTORY: Blood In Urine - Father Death In The Family Father - Father Death In The Family Mother - Mother Family Health Status Number - [place] In Family Malignant Pancreatic Neoplasm - Father nephrolithiasis - Father Parkinson's Disease -  Mother Prostate Cancer - Father   SOCIAL HISTORY: Marital Status: Married Preferred Language: English; Race: White Current Smoking Status: Patient does not smoke anymore.   Tobacco Use Assessment Completed: Used Tobacco in last 30 days? Drinks 1 caffeinated drink per day.     Notes: Former smoker, Occupation:, Caffeine Use, Marital History - Currently Married, Alcohol Use, Tobacco Use   REVIEW OF SYSTEMS:    GU Review Male:   Patient reports frequent urination, burning/ pain with urination, get up at night to urinate, leakage of urine, stream starts and stops, and trouble starting your stream. Patient denies hard to postpone urination, have to strain to urinate , erection problems, and penile pain.  Gastrointestinal (Upper):   Patient reports nausea. Patient denies vomiting and indigestion/ heartburn.  Gastrointestinal (Lower):   Patient denies diarrhea and constipation.  Constitutional:   Patient reports fever and weight loss. Patient denies night sweats and fatigue.  Skin:   Patient denies skin rash/ lesion and itching.  Eyes:   Patient reports double vision.  Patient denies blurred vision.  Ears/ Nose/ Throat:   Patient denies sore throat and sinus problems.  Hematologic/Lymphatic:   Patient denies swollen glands and easy bruising.  Cardiovascular:   Patient denies leg swelling and chest pains.  Respiratory:   Patient denies cough and shortness of breath.  Endocrine:   Patient denies excessive thirst.  Musculoskeletal:   Patient denies back pain and joint pain.  Neurological:   Patient denies headaches and dizziness.   VITAL SIGNS:      12/04/2020 10:45 AM  BP 130/73 mmHg  Heart Rate 63 /min  Temperature 97.8 F / 36.5 C   Complexity of Data:  Source Of History:  Patient  Records Review:   Previous Doctor Records, Previous Hospital Records  Urine Test Review:   Urinalysis  X-Ray Review: C.T. Abdomen/Pelvis: Reviewed Films. Reviewed Report. Discussed With Patient.     07/25/20  04/01/20 02/19/20 01/07/20 10/30/19 08/29/19 09/28/18 08/29/18  PSA  Total PSA 1.35 ng/mL 2.24 ng/mL 1.19 ng/mL 4.11 ng/mL 5.01 ng/mL 5.14 ng/mL 3.52 ng/mL 4.48 ng/mL  Free PSA      0.91 ng/mL  1.73 ng/mL  % Free PSA      18 % PSA  39 % PSA    07/25/20 04/01/20 02/19/20 01/07/20 08/29/19 02/27/19 08/21/18 02/23/18  Hormones  Testosterone, Total 478.7 ng/dL 587.0 ng/dL <10 ng/dL 13.3 ng/dL 275.7 ng/dL 644.6 ng/dL 737.3 ng/dL 640.8 ng/dL    PROCEDURES:         KUB - 74018  A single view of the abdomen is obtained.      Patient confirmed No Neulasta OnPro Device.          PVR Ultrasound - 34196  Scanned Volume: 110 cc         Urinalysis w/Scope Dipstick Dipstick Cont'd Micro  Color: Yellow Bilirubin: Neg mg/dL WBC/hpf: NS (Not Seen)  Appearance: Clear Ketones: Neg mg/dL RBC/hpf: 10 - 20/hpf  Specific Gravity: 1.015 Blood: 3+ ery/uL Bacteria: Rare (0-9/hpf)  pH: 5.5 Protein: Trace mg/dL Cystals: NS (Not Seen)  Glucose: 1+ mg/dL Urobilinogen: 0.2 mg/dL Casts: NS (Not Seen)    Nitrites: Neg Trichomonas: Not Present    Leukocyte Esterase: Neg leu/uL Mucous: Not Present      Epithelial Cells: NS (Not Seen)      Yeast: NS (Not Seen)      Sperm: Not Present    ASSESSMENT:      ICD-10 Details  1 GU:   Ureteral calculus - Q22.9 Acute, Complicated Injury     PLAN:           Orders X-Rays: KUB          Schedule         Document Letter(s):  Created for Patient: Clinical Summary         Notes:   I discussed the CT findings and the KUB findings with the patient. We discussed treatment options for right proximal stone. He is currently asymptomatic. We discussed trial of tamsulosin which was prescribed through the ER last night as medical expulsive flank from therapy. Also discussed possibility of ureteroscopy and laser lithotripsy versus in situ ESL. The patient has had both in the past and desires to try ESL. I told him that we would try to schedule for early next week but if  he became more symptomatic with worsening pain nausea vomiting or fever he may require stent. We have scheduled him accordingly for in situ ESL on 12/08/2020. Risks and benefits discussed as outlined below.  I have discussed with the patient the risks and consequences of the procedure of extracorporeal shockwave lithotripsy to include, but not limited to: Bleeding, including bleeding in the urine, bleeding around the kidney with hematoma formation and rarely bleeding to the point of loss of the kidney, infection, damage to the surrounding structures including soft tissue and bowel perforation, residual stone fragments requiring the need for future treatments including endoscopic surgery, open surgery or percutaneous surgery. I have emphasized that study showed that up to 25% of patients will require additional procedures depending on stone size and composition. I have also discussed with the patient that the success rate for ESWL is approximately 60-90% and depends on stone location and stone composition as well as preoperative stone size. The patient voices understanding of the risks and benefits of the above and consents to the procedure.         Next Appointment:      Next Appointment: 12/08/2020 07:30 AM    Appointment Type: Surgery     Location: [facility] [phone]    Provider: Raynelle Bring, M.D.    Reason for Visit: OP NE RT ESWL      * Signed by Harold Barban, M.D. on 12/04/20 at 12:30 PM (EDT)*

## 2020-12-08 NOTE — Discharge Instructions (Signed)
1. You should strain your urine and collect all fragments and bring them to your follow up appointment.  °2. You should take your pain medication as needed.  Please call if your pain is severe to the point that it is not controlled with your pain medication. °3. You should call if you develop fever > 101 or persistent nausea or vomiting. °4. Your doctor may prescribe tamsulosin to take to help facilitate stone passage. °

## 2020-12-09 ENCOUNTER — Encounter (HOSPITAL_BASED_OUTPATIENT_CLINIC_OR_DEPARTMENT_OTHER): Payer: Self-pay | Admitting: Urology

## 2021-01-27 ENCOUNTER — Ambulatory Visit (INDEPENDENT_AMBULATORY_CARE_PROVIDER_SITE_OTHER): Payer: Medicare Other | Admitting: Orthopedic Surgery

## 2021-01-27 DIAGNOSIS — M76821 Posterior tibial tendinitis, right leg: Secondary | ICD-10-CM

## 2021-01-27 DIAGNOSIS — R931 Abnormal findings on diagnostic imaging of heart and coronary circulation: Secondary | ICD-10-CM

## 2021-02-04 ENCOUNTER — Ambulatory Visit: Payer: Medicare Other | Admitting: Neurology

## 2021-02-08 ENCOUNTER — Emergency Department (HOSPITAL_BASED_OUTPATIENT_CLINIC_OR_DEPARTMENT_OTHER): Payer: Medicare Other

## 2021-02-08 ENCOUNTER — Encounter (HOSPITAL_BASED_OUTPATIENT_CLINIC_OR_DEPARTMENT_OTHER): Payer: Self-pay

## 2021-02-08 ENCOUNTER — Other Ambulatory Visit: Payer: Self-pay

## 2021-02-08 ENCOUNTER — Emergency Department (HOSPITAL_BASED_OUTPATIENT_CLINIC_OR_DEPARTMENT_OTHER)
Admission: EM | Admit: 2021-02-08 | Discharge: 2021-02-08 | Disposition: A | Discharge status: Home / Self Care | Payer: Medicare Other | Attending: Emergency Medicine | Admitting: Emergency Medicine

## 2021-02-08 DIAGNOSIS — Z87891 Personal history of nicotine dependence: Secondary | ICD-10-CM | POA: Insufficient documentation

## 2021-02-08 DIAGNOSIS — Z79899 Other long term (current) drug therapy: Secondary | ICD-10-CM | POA: Insufficient documentation

## 2021-02-08 DIAGNOSIS — Z20822 Contact with and (suspected) exposure to covid-19: Secondary | ICD-10-CM | POA: Insufficient documentation

## 2021-02-08 DIAGNOSIS — R509 Fever, unspecified: Secondary | ICD-10-CM | POA: Diagnosis not present

## 2021-02-08 DIAGNOSIS — M25511 Pain in right shoulder: Secondary | ICD-10-CM | POA: Diagnosis not present

## 2021-02-08 DIAGNOSIS — S0990XA Unspecified injury of head, initial encounter: Secondary | ICD-10-CM | POA: Insufficient documentation

## 2021-02-08 DIAGNOSIS — J189 Pneumonia, unspecified organism: Secondary | ICD-10-CM | POA: Insufficient documentation

## 2021-02-08 DIAGNOSIS — Z85828 Personal history of other malignant neoplasm of skin: Secondary | ICD-10-CM | POA: Diagnosis not present

## 2021-02-08 DIAGNOSIS — Z8579 Personal history of other malignant neoplasms of lymphoid, hematopoietic and related tissues: Secondary | ICD-10-CM | POA: Diagnosis not present

## 2021-02-08 DIAGNOSIS — Z8546 Personal history of malignant neoplasm of prostate: Secondary | ICD-10-CM | POA: Diagnosis not present

## 2021-02-08 DIAGNOSIS — W109XXA Fall (on) (from) unspecified stairs and steps, initial encounter: Secondary | ICD-10-CM | POA: Insufficient documentation

## 2021-02-08 DIAGNOSIS — I10 Essential (primary) hypertension: Secondary | ICD-10-CM | POA: Insufficient documentation

## 2021-02-08 DIAGNOSIS — W19XXXA Unspecified fall, initial encounter: Secondary | ICD-10-CM

## 2021-02-08 DIAGNOSIS — R739 Hyperglycemia, unspecified: Secondary | ICD-10-CM

## 2021-02-08 HISTORY — DX: Tremor, unspecified: R25.1

## 2021-02-08 LAB — URINALYSIS, ROUTINE W REFLEX MICROSCOPIC
Bilirubin Urine: NEGATIVE
Glucose, UA: >=500 mg/dL — AB
Ketones, ur: NEGATIVE mg/dL
Leukocytes,Ua: NEGATIVE
Nitrite: NEGATIVE
Protein, ur: 30 mg/dL — AB
Specific Gravity, Urine: 1.025 (ref 1.005–1.030)
pH: 5.5 (ref 5.0–8.0)

## 2021-02-08 LAB — CBC WITH DIFFERENTIAL/PLATELET
Abs Immature Granulocytes: 0.1 10*3/uL — ABNORMAL HIGH (ref 0.00–0.07)
Basophils Absolute: 0.1 10*3/uL (ref 0.0–0.1)
Basophils Relative: 1 %
Eosinophils Absolute: 0 10*3/uL (ref 0.0–0.5)
Eosinophils Relative: 0 %
HCT: 46.6 % (ref 39.0–52.0)
Hemoglobin: 15.1 g/dL (ref 13.0–17.0)
Immature Granulocytes: 1 %
Lymphocytes Relative: 5 %
Lymphs Abs: 0.5 10*3/uL — ABNORMAL LOW (ref 0.7–4.0)
MCH: 30.4 pg (ref 26.0–34.0)
MCHC: 32.4 g/dL (ref 30.0–36.0)
MCV: 93.8 fL (ref 80.0–100.0)
Monocytes Absolute: 0.9 10*3/uL (ref 0.1–1.0)
Monocytes Relative: 9 %
Neutro Abs: 8.5 10*3/uL — ABNORMAL HIGH (ref 1.7–7.7)
Neutrophils Relative %: 84 %
Platelets: 307 10*3/uL (ref 150–400)
RBC: 4.97 MIL/uL (ref 4.22–5.81)
RDW: 14.7 % (ref 11.5–15.5)
WBC: 10.1 10*3/uL (ref 4.0–10.5)
nRBC: 0 % (ref 0.0–0.2)

## 2021-02-08 LAB — COMPREHENSIVE METABOLIC PANEL
ALT: 48 U/L — ABNORMAL HIGH (ref 0–44)
AST: 52 U/L — ABNORMAL HIGH (ref 15–41)
Albumin: 4 g/dL (ref 3.5–5.0)
Alkaline Phosphatase: 150 U/L — ABNORMAL HIGH (ref 38–126)
Anion gap: 12 (ref 5–15)
BUN: 14 mg/dL (ref 8–23)
CO2: 25 mmol/L (ref 22–32)
Calcium: 8.9 mg/dL (ref 8.9–10.3)
Chloride: 98 mmol/L (ref 98–111)
Creatinine, Ser: 1.14 mg/dL (ref 0.61–1.24)
GFR, Estimated: >60 mL/min (ref >60)
Glucose, Bld: 226 mg/dL — ABNORMAL HIGH (ref 70–99)
Potassium: 3.3 mmol/L — ABNORMAL LOW (ref 3.5–5.1)
Sodium: 135 mmol/L (ref 135–145)
Total Bilirubin: 0.7 mg/dL (ref 0.3–1.2)
Total Protein: 7.8 g/dL (ref 6.5–8.1)

## 2021-02-08 LAB — URINALYSIS, MICROSCOPIC (REFLEX)

## 2021-02-08 LAB — RESP PANEL BY RT-PCR (FLU A&B, COVID) ARPGX2
Influenza A by PCR: NEGATIVE
Influenza B by PCR: NEGATIVE
SARS Coronavirus 2 by RT PCR: NEGATIVE

## 2021-02-08 MED ORDER — POTASSIUM CHLORIDE CRYS ER 20 MEQ PO TBCR
40.0000 meq | EXTENDED_RELEASE_TABLET | Freq: Once | ORAL | Status: AC
  Administered 2021-02-08: 23:00:00 40 meq via ORAL
  Filled 2021-02-08: qty 2

## 2021-02-08 MED ORDER — DOXYCYCLINE HYCLATE 100 MG PO TABS
100.0000 mg | ORAL_TABLET | Freq: Once | ORAL | Status: AC
  Administered 2021-02-08: 23:00:00 100 mg via ORAL
  Filled 2021-02-08: qty 1

## 2021-02-08 MED ORDER — AMOXICILLIN-POT CLAVULANATE 875-125 MG PO TABS
1.0000 | ORAL_TABLET | Freq: Once | ORAL | Status: AC
  Administered 2021-02-08: 23:00:00 1 via ORAL
  Filled 2021-02-08: qty 1

## 2021-02-08 MED ORDER — AMOXICILLIN-POT CLAVULANATE 875-125 MG PO TABS: 1.0000 | ORAL_TABLET | Freq: Two times a day (BID) | ORAL | 0 refills | Status: AC

## 2021-02-08 MED ORDER — DOXYCYCLINE HYCLATE 100 MG PO CAPS: 100.0000 mg | ORAL_CAPSULE | Freq: Two times a day (BID) | ORAL | 0 refills | Status: AC

## 2021-02-08 NOTE — ED Notes (Signed)
Patient discharged to home.  All discharge instructions reviewed.  Patient verbalized understanding via teachback method.  VS WDL.  Respirations even and unlabored.  Ambulatory out of ED.   °

## 2021-02-08 NOTE — ED Notes (Signed)
Provider at bedside, unable to obtain vitals at this time.

## 2021-02-08 NOTE — ED Provider Notes (Signed)
[facility] EMERGENCY DEPARTMENT Provider Note   CSN: [id number] Arrival date & time: 02/08/21  1742     History Chief Complaint  Patient presents with   Fall   Fever    ADIEL ERNEY is a 75 y.o. male.  HPI   Pt is a 75 y/o with a h/o amyloidosis, GERD, hearing loss, hyperlipidemia, multiple myeloma, nephrolithiasis, tremors, prostate cancer, prostatitis, who presents to the ED today for eval of sore throat, rhinorrhea, cough for the last 3 days. Cough is productive of mucous. He has also had subjective fevers at home. He was planning to come to the ED today and when he was leaving he fell down an estimated 12-14 stairs. He is c/o pain to the right shoulder and head trauma.   Denies any neck or back pain. Denies dizziness, nausea, vomiting, LOC, abd pain, rib pain.   No anticoagulated  Additionally, patient expressing concern about a wound she has left side of his face.  He went to a barbershop and had a shave and has a small cut to the left side of the face just anterior to the ear.  He is concerned about potentially contracting COVID, HIV or any other disorder from this injury.  Past Medical History:  Diagnosis Date   Amyloidosis ([place])    GERD (gastroesophageal reflux disease)    Hearing loss    Has hearing aids   History of kidney stones    Hyperlipidemia    Multiple myeloma ([facility])    and amylodosis    Nephrolithiasis    Occasional tremors    Prostate cancer ([place])    Prostatitis    acute and chronic   Skin cancer     Patient Active Problem List   Diagnosis Date Noted   Malignant neoplasm of prostate ([place]) 02/11/2020   Orthostatic tremor 10/01/2019   Educated about COVID-19 virus infection 09/12/2019   Dyslipidemia 09/12/2019   Nonrheumatic pulmonary valve insufficiency 09/12/2019   Red blood cell antibody positive, compatible PRBC difficult to obtain 02/15/2018   Diplopia 01/03/2018   Benign essential tremor 01/03/2018   Pain in joint of  right ankle 05/17/2017   Foot pain 05/17/2017   Peroneal tendinitis 05/17/2017   Depression with anxiety 01/03/2017   Insomnia 05/05/2016   Coronary artery calcification seen on CAT scan    DOE (dyspnea on exertion)    Fatigue 02/27/2015   Elevated homocysteine 09/26/2014   Multiple myeloma ([place]) 09/26/2014   Acquired pes planus of right foot 07/02/2014   Posterior tibial tendon dysfunction 07/02/2014   Encounter for antineoplastic chemotherapy 01/02/2014   Kahler disease ([place]) 01/02/2014   Avitaminosis D 01/02/2014   Amyloidosis ([place]) 03/26/2013   Enlarged lymph node 02/14/2013   Pure hypercholesterolemia 03/29/2011   Memory disorder 09/21/2010   Labile hypertension 09/21/2010    Past Surgical History:  Procedure Laterality Date   APPENDECTOMY     CARDIAC CATHETERIZATION N/A 06/13/2015   Procedure: Left Heart Cath and Coronary Angiography;  Surgeon: Jettie Booze, MD;  Location: [name];  Service: Cardiovascular;  Laterality: N/A;   COLONOSCOPY  04/07/00   ELBOW SURGERY  2003   right   EXTRACORPOREAL SHOCK WAVE LITHOTRIPSY Left 07/17/2018   Procedure: EXTRACORPOREAL SHOCK WAVE LITHOTRIPSY (ESWL);  Surgeon: [name], MD;  Location[facility];  Service: Urology;  Laterality: Left;   EXTRACORPOREAL SHOCK WAVE LITHOTRIPSY Right 12/08/2020   Procedure: RIGHT EXTRACORPOREAL SHOCK WAVE LITHOTRIPSY (ESWL);  Surgeon: Raynelle Bring, MD;  Location: [facility];  Service: Urology;  Laterality: Right;   HERNIA REPAIR     ROTATOR CUFF REPAIR         Family History  Problem Relation Age of Onset   Pancreatic cancer Father    Prostate cancer Father    Parkinsonism Mother    CAD Brother 21   Cancer Paternal Grandmother    Breast cancer Neg Hx    Colon cancer Neg Hx     Social History   Tobacco Use   Smoking status: Former    Packs/day: 3.00    Years: 20.00    Pack years: 60.00    Types: Cigarettes    Quit date: 03/15/1977    Years since quitting:  43.9   Smokeless tobacco: Never   Tobacco comments:    quit 35 years ago  Vaping Use   Vaping Use: Never used  Substance Use Topics   Alcohol use: Yes    Alcohol/week: 3.0 standard drinks    Types: 3 Glasses of wine per week    Comment: approx 1 drink/night   Drug use: No    Home Medications Prior to Admission medications   Medication Sig Start Date End Date Taking? Authorizing Provider  amoxicillin-clavulanate (AUGMENTIN) 875-125 MG tablet Take 1 tablet by mouth 2 (two) times daily for 5 days. 02/08/21 02/13/21 Yes Lennyn Gange S, PA-C  doxycycline (VIBRAMYCIN) 100 MG capsule Take 1 capsule (100 mg total) by mouth 2 (two) times daily for 5 days. 02/08/21 02/13/21 Yes Benzion Mesta S, PA-C  daratumumab (DARZALEX) 100 MG/5ML Inject into the vein.    [provider]  dexamethasone (DECADRON) 2 MG tablet Take 4 mg by mouth daily.    [provider]  HYDROcodone-acetaminophen (NORCO/VICODIN) 5-325 MG tablet Take 2 tablets by mouth every 4 (four) hours as needed. 12/04/20   Rancour, Annie Main, MD  HYDROcodone-acetaminophen (NORCO/VICODIN) 5-325 MG tablet Take 1-2 tablets by mouth every 6 (six) hours as needed. 12/08/20   Raynelle Bring, MD  ondansetron (ZOFRAN ODT) 4 MG disintegrating tablet Take 1 tablet (4 mg total) by mouth every 8 (eight) hours as needed for nausea or vomiting. 12/04/20   Rancour, Annie Main, MD  propranolol ER (INDERAL LA) 60 MG 24 hr capsule Take 60 mg by mouth daily.    [provider]  tamsulosin (FLOMAX) 0.4 MG CAPS capsule Take 1 capsule (0.4 mg total) by mouth daily. 12/04/20   Rancour, Annie Main, MD  Testosterone 20.25 MG/ACT (1.62%) GEL APPLY 4 PUMPS TO SKIN IN THE MORNING (APPLY AFTER SHOWER OR BATH TO CLEAN, DRY, INTACT SKIN ON UPPER ARM OR SHOULDER AREAS ONLY) 06/13/20   [provider]  zolpidem (AMBIEN) 5 MG tablet  01/15/20   [provider]    Allergies    Penicillins  Review of Systems   Review of Systems   Constitutional:  Positive for fever (subjective). Negative for chills.  HENT:  Positive for sore throat.   Eyes:  Negative for visual disturbance.  Respiratory:  Positive for cough. Negative for shortness of breath.   Cardiovascular:  Negative for chest pain.  Gastrointestinal:  Negative for abdominal pain, nausea and vomiting.  Genitourinary:  Negative for flank pain.  Musculoskeletal:  Negative for back pain and neck pain.       R shoulder pain  Skin:  Negative for wound.  Neurological:  Negative for weakness and numbness.       Head injury, no loc  All other systems reviewed and are negative.  Physical Exam  Updated Vital Signs BP (!) 160/75 (BP Location: Right Arm)   Pulse 87   Temp 98.6 F (37 C) (Oral)   Resp 18   Ht '5\' 6"'  (1.676 m)   Wt 63.5 kg   SpO2 98%   BMI 22.60 kg/m   Physical Exam Vitals and nursing note reviewed.  Constitutional:      General: He is not in acute distress.    Appearance: He is well-developed.  HENT:     Head: Normocephalic and atraumatic.  Eyes:     Conjunctiva/sclera: Conjunctivae normal.  Cardiovascular:     Rate and Rhythm: Normal rate and regular rhythm.     Heart sounds: No murmur heard. Pulmonary:     Effort: Pulmonary effort is normal. No respiratory distress.     Breath sounds: Normal breath sounds.  Abdominal:     Palpations: Abdomen is soft.     Tenderness: There is no abdominal tenderness.  Musculoskeletal:        General: No swelling.     Cervical back: Neck supple.     Comments: No midline TTP to the CTL spine.   Skin:    General: Skin is warm and dry.     Capillary Refill: Capillary refill takes less than 2 seconds.  Neurological:     Mental Status: He is alert.     Comments: Mental Status:  Alert, thought content appropriate, able to give a coherent history. Speech fluent without evidence of aphasia. Able to follow 2 step commands without difficulty.  Cranial Nerves: II-XII intact Motor:  Normal tone. 5/5  strength of BUE and BLE major muscle groups including strong and equal grip strength and dorsiflexion/plantar flexion Sensory: light touch normal in all extremities.   Psychiatric:        Mood and Affect: Mood normal.    ED Results / Procedures / Treatments   Labs (all labs ordered are listed, but only abnormal results are displayed) Labs Reviewed  CBC WITH DIFFERENTIAL/PLATELET - Abnormal; Notable for the following components:      Result Value   Neutro Abs 8.5 (*)    Lymphs Abs 0.5 (*)    Abs Immature Granulocytes 0.10 (*)    All other components within normal limits  COMPREHENSIVE METABOLIC PANEL - Abnormal; Notable for the following components:   Potassium 3.3 (*)    Glucose, Bld 226 (*)    AST 52 (*)    ALT 48 (*)    Alkaline Phosphatase 150 (*)    All other components within normal limits  URINALYSIS, ROUTINE W REFLEX MICROSCOPIC - Abnormal; Notable for the following components:   Glucose, UA >=500 (*)    Hgb urine dipstick SMALL (*)    Protein, ur 30 (*)    All other components within normal limits  URINALYSIS, MICROSCOPIC (REFLEX) - Abnormal; Notable for the following components:   Bacteria, UA RARE (*)    All other components within normal limits  RESP PANEL BY RT-PCR (FLU A&B, COVID) [id number]    EKG None  Radiology DG Chest 2 View  Result Date: 02/08/2021 CLINICAL DATA:  Fall, cough and chills. EXAM: CHEST - 2 VIEW COMPARISON:  Chest x-ray 05/06/2020. FINDINGS: There is minimal patchy opacity in the left lung base and right infrahilar region. There is no pleural effusion or pneumothorax identified. Cardiomediastinal silhouette is within normal limits. No acute fractures are seen. IMPRESSION: Right infrahilar and left basilar atelectasis/airspace disease. Correlate clinically for infection. Followup PA and lateral chest X-ray is recommended in  3-4 weeks following trial of antibiotic therapy to ensure resolution and exclude underlying malignancy. Electronically Signed    By: Ronney Asters M.D.   On: 02/08/2021 21:36   DG Shoulder Right  Result Date: 02/08/2021 CLINICAL DATA:  [place] down 11 stairs.  Shoulder pain. EXAM: RIGHT SHOULDER - 2+ VIEW COMPARISON:  Chest x-ray 05/06/2020. FINDINGS: There is no evidence of fracture or dislocation. There is no evidence of arthropathy or other focal bone abnormality. There has been surgical resection of the distal clavicle, similar to the prior study. Soft tissues are unremarkable. IMPRESSION: No acute bony abnormality. Electronically Signed   By: Ronney Asters M.D.   On: 02/08/2021 19:31   CT Head Wo Contrast  Result Date: 02/08/2021 CLINICAL DATA:  Head trauma. EXAM: CT HEAD WITHOUT CONTRAST TECHNIQUE: Contiguous axial images were obtained from the base of the skull through the vertex without intravenous contrast. COMPARISON:  None. FINDINGS: Brain: The ventricles and sulci are appropriate size for patient's age. Mild periventricular and deep white matter chronic microvascular ischemic changes noted. There is no acute intracranial hemorrhage. No mass effect or midline shift. No extra-axial fluid collection. Vascular: No hyperdense vessel or unexpected calcification. Skull: Normal. Negative for fracture or focal lesion. Sinuses/Orbits: There is a 1.1 x 0.6 cm ovoid lesion in the posterior right orbit along the lateral rectus muscle. This lesion indents the optic nerve (10/2). There is resulting dysconjugate gaze. Further evaluation with orbital MRI without and with contrast is recommended. Other: None IMPRESSION: 1. No acute intracranial pathology. 2. Mild chronic microvascular ischemic changes. 3. Ovoid lesion in the posterior right orbit along the lateral rectus muscle. Further evaluation with orbital MRI without and with contrast is recommended. Electronically Signed   By: Anner Crete M.D.   On: 02/08/2021 21:31    Procedures Procedures   Medications Ordered in ED Medications  potassium chloride SA (KLOR-CON) CR tablet  40 mEq (has no administration in time range)  amoxicillin-clavulanate (AUGMENTIN) 875-125 MG per tablet 1 tablet (has no administration in time range)  doxycycline (VIBRA-TABS) tablet 100 mg (has no administration in time range)    ED Course  I have reviewed the triage vital signs and the nursing notes.  Pertinent labs & imaging results that were available during my care of the patient were reviewed by me and considered in my medical decision making (see chart for details).    MDM Rules/Calculators/A&P                          75 year old male presents emergency department today for evaluation of URI symptoms and fall.  Patient also, concerned about contracting a blood-borne illness from a cut that he sustained while he was in the shop yesterday.  The cut is less than 0.5 cm and has no surrounding erythema induration or signs of infection.  Discussed that this is likely low risk for any blood-borne infection is advised patient that he would not be able to contract COVID from this exposure.  COVID and flu are negative.  Patient only complaining of pain to the right shoulder and also sustained head trauma without LOC.  His neuro exam is benign however given age over 43 with significant mechanism will obtain CT head to rule any intracranial abnormalities.  We will also get labs and UA to screen for other infections.  Reviewed/interpreted labs CBC with a mild left shift otherwise no leukocytosis or anemia CMP with mild hypokalemia, elevated blood glucose at 226,  mildly elevated LFTs, otherwise reassuring UA is nonconcerning for infection.  Reviewed/interpreted imaging X-ray right shoulder without any acute bony abnormality Chest x-ray - : Right infrahilar and left basilar atelectasis/airspace disease. Correlate clinically for infection. Followup PA and lateral chest X-ray is recommended in 3-4 weeks following trial of antibiotic therapy to ensure resolution and exclude underlying malignancy.   CT head - 1. No acute intracranial pathology. 2. Mild chronic microvascular ischemic changes. 3. Ovoid lesion in the posterior right orbit along the lateral rectus muscle. Further evaluation with orbital MRI without and with contrast is recommended.  - reviewed care everywhere. This was imaged at [facility] 4 months ago.   Patient's work-up here is reassuring that he does have evidence of community-acquired pneumonia.  We will give him his first dose of antibiotics here in the emergency department.  He certainly does not look toxic appearing and I do not feel that he requires admission at this time.  Feel that he will do well with outpatient therapy and close follow-up with his PCP.  Have advised nonspecific return precautions.  He voices understanding of the plan and reasons to return.  All questions answered.  Patient stable for discharge.  Final Clinical Impression(s) / ED Diagnoses Final diagnoses:  Fall  Elevated blood sugar  Community acquired pneumonia, unspecified laterality    Rx / DC Orders ED Discharge Orders          Ordered    amoxicillin-clavulanate (AUGMENTIN) 875-125 MG tablet  2 times daily        02/08/21 2209    doxycycline (VIBRAMYCIN) 100 MG capsule  2 times daily        02/08/21 2209             Bishop Dublin 02/08/21 2212    Margette Fast, MD 02/09/21 1305

## 2021-02-08 NOTE — ED Triage Notes (Addendum)
Pt complains of fever, chills, and productive cough x3 days. Recently exposed to flu. Fell down 11 stairs while getting ready to come to hospital. Pt complains of R shoulder pain. No LOC. Did hit head. No thinners. A&O x4 with no neuro complaints.

## 2021-02-08 NOTE — Discharge Instructions (Addendum)
You were given a prescription for antibiotics. Please take the antibiotic prescription fully.   You are noted to have an elevated blood sugar in the emergency department.  While this was not a fasting blood sugar so this does not mean that you have been diagnosed with diabetes but you will need to follow-up with your regular doctor for reassessment of this.  You will to have your x-ray of your chest repeated in the next 3 to 4 weeks after completing antibiotics to rule out any sort of malignancy or lung nodule  Please follow up with your primary care provider within 5-7 days for re-evaluation of your symptoms. If you do not have a primary care provider, information for a healthcare clinic has been provided for you to make arrangements for follow up care. Please return to the emergency department for any new or worsening symptoms.

## 2021-02-13 ENCOUNTER — Encounter: Payer: Self-pay | Admitting: Orthopedic Surgery

## 2021-02-13 NOTE — Progress Notes (Signed)
Office Visit Note   Patient: William Barron           Date of Birth: [birth year]           MRN: [id number] Visit Date: 01/27/2021              Requested by: [name], MD [address] [place],  [place] [address] PCP: [name], MD  Chief Complaint  Patient presents with   Right Ankle - Pain, Follow-up   Right Foot - Pain, Follow-up      HPI: Patient is a 75 year old gentleman with chronic posterior tibial tendon insufficiency on the right.  Patient states that 2 weeks ago he stepped off a sidewalk and had acute pain at the insertion of the posterior tibial tendon.  Assessment & Plan: Visit Diagnoses:  1. Posterior tibial tendinitis, right leg     Plan: Due to the persistent symptoms and insufficiency of the posterior tibial tendon and failure of conservative treatment patient would like to proceed with surgical intervention.  We will plan for talonavicular and subtalar fusion at [facility] outpatient or overnight observation.  Patient would like to proceed with surgery in January.  Follow-Up Instructions: Return if symptoms worsen or fail to improve.   Ortho Exam  Patient is alert, oriented, no adenopathy, well-dressed, normal affect, normal respiratory effort. Examination patient has a good pulse.  He has a pronated valgus right foot with pes planus with chronic posterior tibial tendon insufficiency on the right he is tender to palpation of the insertion.  Pain with resisted inversion a positive too many toes sign.  Patient cannot do a single limb heel raise.  Imaging: No results found. No images are attached to the encounter.  Labs: Lab Results  Component Value Date   REPTSTATUS 05/04/2015 FINAL 05/01/2015   CULT  05/01/2015    >=100,000 COLONIES/mL ESCHERICHIA COLI Performed at [facility] 05/01/2015     Lab Results  Component Value Date   ALBUMIN 4.0 02/08/2021   ALBUMIN 3.8 12/04/2020   ALBUMIN 3.9  06/21/2018    No results found for: MG Lab Results  Component Value Date   VD25OH 24.1 (L) 09/26/2014    No results found for: PREALBUMIN CBC EXTENDED Latest Ref Rng & Units 02/08/2021 12/04/2020 05/06/2020  WBC 4.0 - 10.5 K/uL 10.1 7.7 4.0  RBC 4.22 - 5.81 MIL/uL 4.97 4.53 4.17(L)  HGB 13.0 - 17.0 g/dL 15.1 13.8 12.8(L)  HCT 39.0 - 52.0 % 46.6 43.9 39.9  PLT 150 - 400 K/uL 307 300 333  NEUTROABS 1.7 - 7.7 K/uL 8.5(H) 6.5 2.3  LYMPHSABS 0.7 - 4.0 K/uL 0.5(L) 0.5(L) 0.8     There is no height or weight on file to calculate BMI.  Orders:  No orders of the defined types were placed in this encounter.  No orders of the defined types were placed in this encounter.    Procedures: No procedures performed  Clinical Data: No additional findings.  ROS:  All other systems negative, except as noted in the HPI. Review of Systems  Objective: Vital Signs: There were no vitals taken for this visit.  Specialty Comments:  No specialty comments available.  PMFS History: Patient Active Problem List   Diagnosis Date Noted   Malignant neoplasm of prostate ([place]) 02/11/2020   Orthostatic tremor 10/01/2019   Educated about COVID-19 virus infection 09/12/2019   Dyslipidemia 09/12/2019   Nonrheumatic pulmonary valve insufficiency 09/12/2019   Red blood cell  antibody positive, compatible PRBC difficult to obtain 02/15/2018   Diplopia 01/03/2018   Benign essential tremor 01/03/2018   Pain in joint of right ankle 05/17/2017   Foot pain 05/17/2017   Peroneal tendinitis 05/17/2017   Depression with anxiety 01/03/2017   Insomnia 05/05/2016   Coronary artery calcification seen on CAT scan    DOE (dyspnea on exertion)    Fatigue 02/27/2015   Elevated homocysteine 09/26/2014   Multiple myeloma ([place]) 09/26/2014   Acquired pes planus of right foot 07/02/2014   Posterior tibial tendon dysfunction 07/02/2014   Encounter for antineoplastic chemotherapy 01/02/2014   Kahler disease ([place])  01/02/2014   Avitaminosis D 01/02/2014   Amyloidosis ([place]) 03/26/2013   Enlarged lymph node 02/14/2013   Pure hypercholesterolemia 03/29/2011   Memory disorder 09/21/2010   Labile hypertension 09/21/2010   Past Medical History:  Diagnosis Date   Amyloidosis ([facility])    GERD (gastroesophageal reflux disease)    Hearing loss    Has hearing aids   History of kidney stones    Hyperlipidemia    Multiple myeloma ([facility])    and amylodosis    Nephrolithiasis    Occasional tremors    Prostate cancer ([place])    Prostatitis    acute and chronic   Skin cancer     Family History  Problem Relation Age of Onset   Pancreatic cancer Father    Prostate cancer Father    Parkinsonism Mother    CAD Brother 77   Cancer Paternal Grandmother    Breast cancer Neg Hx    Colon cancer Neg Hx     Past Surgical History:  Procedure Laterality Date   APPENDECTOMY     CARDIAC CATHETERIZATION N/A 06/13/2015   Procedure: Left Heart Cath and Coronary Angiography;  Surgeon: Jettie Booze, MD;  Location: [facility];  Service: Cardiovascular;  Laterality: N/A;   COLONOSCOPY  04/07/00   ELBOW SURGERY  2003   right   EXTRACORPOREAL SHOCK WAVE LITHOTRIPSY Left 07/17/2018   Procedure: EXTRACORPOREAL SHOCK WAVE LITHOTRIPSY (ESWL);  Surgeon: [name], MD;  Location[facility];  Service: Urology;  Laterality: Left;   EXTRACORPOREAL SHOCK WAVE LITHOTRIPSY Right 12/08/2020   Procedure: RIGHT EXTRACORPOREAL SHOCK WAVE LITHOTRIPSY (ESWL);  Surgeon: Raynelle Bring, MD;  Location: [facility];  Service: Urology;  Laterality: Right;   HERNIA REPAIR     ROTATOR CUFF REPAIR     Social History   Occupational History   Occupation: retired  Tobacco Use   Smoking status: Former    Packs/day: 3.00    Years: 20.00    Pack years: 60.00    Types: Cigarettes    Quit date: 03/15/1977    Years since quitting: 43.9   Smokeless tobacco: Never   Tobacco comments:    quit 35 years ago  Vaping Use    Vaping Use: Never used  Substance and Sexual Activity   Alcohol use: Yes    Alcohol/week: 3.0 standard drinks    Types: 3 Glasses of wine per week    Comment: approx 1 drink/night   Drug use: No   Sexual activity: Not Currently    Birth control/protection: None

## 2021-02-17 ENCOUNTER — Other Ambulatory Visit: Payer: Self-pay

## 2021-02-17 DIAGNOSIS — M25571 Pain in right ankle and joints of right foot: Secondary | ICD-10-CM

## 2021-02-17 DIAGNOSIS — M76821 Posterior tibial tendinitis, right leg: Secondary | ICD-10-CM

## 2021-03-05 ENCOUNTER — Emergency Department (HOSPITAL_BASED_OUTPATIENT_CLINIC_OR_DEPARTMENT_OTHER)
Admission: EM | Admit: 2021-03-05 | Discharge: 2021-03-05 | Disposition: A | Discharge status: Home / Self Care | Payer: Medicare Other | Attending: Emergency Medicine | Admitting: Emergency Medicine

## 2021-03-05 ENCOUNTER — Emergency Department (HOSPITAL_BASED_OUTPATIENT_CLINIC_OR_DEPARTMENT_OTHER): Payer: Medicare Other

## 2021-03-05 ENCOUNTER — Other Ambulatory Visit: Payer: Self-pay

## 2021-03-05 ENCOUNTER — Encounter (HOSPITAL_BASED_OUTPATIENT_CLINIC_OR_DEPARTMENT_OTHER): Payer: Self-pay | Admitting: *Deleted

## 2021-03-05 DIAGNOSIS — I10 Essential (primary) hypertension: Secondary | ICD-10-CM | POA: Diagnosis not present

## 2021-03-05 DIAGNOSIS — R059 Cough, unspecified: Secondary | ICD-10-CM | POA: Diagnosis not present

## 2021-03-05 DIAGNOSIS — Z8546 Personal history of malignant neoplasm of prostate: Secondary | ICD-10-CM | POA: Diagnosis not present

## 2021-03-05 DIAGNOSIS — R058 Other specified cough: Secondary | ICD-10-CM

## 2021-03-05 DIAGNOSIS — Z87891 Personal history of nicotine dependence: Secondary | ICD-10-CM | POA: Diagnosis not present

## 2021-03-05 MED ORDER — AMOXICILLIN-POT CLAVULANATE 875-125 MG PO TABS: 1.0000 | ORAL_TABLET | Freq: Two times a day (BID) | ORAL | 0 refills | Status: DC

## 2021-03-05 NOTE — ED Triage Notes (Signed)
Recheck pneumonia. Cough. No fever.

## 2021-03-05 NOTE — ED Provider Notes (Signed)
[name] Provider Note   CSN: [id number] Arrival date & time: 03/05/21  1846     History Chief Complaint  Patient presents with   Follow-up    William Barron is a 75 y.o. male.  HPI Patient is a 75 year old male with a medical history as noted below.  He presents to the emergency department for follow-up regarding a pneumonia diagnosis 1 month ago.  Patient states that he completed a course of Augmentin 1 month ago for pneumonia.  He has continued to have a persistent productive cough with white sputum.  He followed up with his oncologist yesterday who recommended that he receive another chest x-ray as well as reevaluation for his pneumonia.  States his other URI symptoms have all resolved.  No other complaints.    Past Medical History:  Diagnosis Date   Amyloidosis ([place])    GERD (gastroesophageal reflux disease)    Hearing loss    Has hearing aids   History of kidney stones    Hyperlipidemia    Multiple myeloma ([facility])    and amylodosis    Nephrolithiasis    Occasional tremors    Prostate cancer ([place])    Prostatitis    acute and chronic   Skin cancer     Patient Active Problem List   Diagnosis Date Noted   Malignant neoplasm of prostate ([place]) 02/11/2020   Orthostatic tremor 10/01/2019   Educated about COVID-19 virus infection 09/12/2019   Dyslipidemia 09/12/2019   Nonrheumatic pulmonary valve insufficiency 09/12/2019   Red blood cell antibody positive, compatible PRBC difficult to obtain 02/15/2018   Diplopia 01/03/2018   Benign essential tremor 01/03/2018   Pain in joint of right ankle 05/17/2017   Foot pain 05/17/2017   Peroneal tendinitis 05/17/2017   Depression with anxiety 01/03/2017   Insomnia 05/05/2016   Coronary artery calcification seen on CAT scan    DOE (dyspnea on exertion)    Fatigue 02/27/2015   Elevated homocysteine 09/26/2014   Multiple myeloma ([place]) 09/26/2014   Acquired pes planus of right foot  07/02/2014   Posterior tibial tendon dysfunction 07/02/2014   Encounter for antineoplastic chemotherapy 01/02/2014   Kahler disease ([place]) 01/02/2014   Avitaminosis D 01/02/2014   Amyloidosis ([place]) 03/26/2013   Enlarged lymph node 02/14/2013   Pure hypercholesterolemia 03/29/2011   Memory disorder 09/21/2010   Labile hypertension 09/21/2010    Past Surgical History:  Procedure Laterality Date   APPENDECTOMY     CARDIAC CATHETERIZATION N/A 06/13/2015   Procedure: Left Heart Cath and Coronary Angiography;  Surgeon: Jettie Booze, MD;  Location: [facility];  Service: Cardiovascular;  Laterality: N/A;   COLONOSCOPY  04/07/00   ELBOW SURGERY  2003   right   EXTRACORPOREAL SHOCK WAVE LITHOTRIPSY Left 07/17/2018   Procedure: EXTRACORPOREAL SHOCK WAVE LITHOTRIPSY (ESWL);  Surgeon: [name], MD;  Location: [facility];  Service: Urology;  Laterality: Left;   EXTRACORPOREAL SHOCK WAVE LITHOTRIPSY Right 12/08/2020   Procedure: RIGHT EXTRACORPOREAL SHOCK WAVE LITHOTRIPSY (ESWL);  Surgeon: Raynelle Bring, MD;  Location: [facility];  Service: Urology;  Laterality: Right;   HERNIA REPAIR     ROTATOR CUFF REPAIR         Family History  Problem Relation Age of Onset   Pancreatic cancer Father    Prostate cancer Father    Parkinsonism Mother    CAD Brother 60   Cancer Paternal Grandmother    Breast cancer Neg Hx    Colon  cancer Neg Hx     Social History   Tobacco Use   Smoking status: Former    Packs/day: 3.00    Years: 20.00    Pack years: 60.00    Types: Cigarettes    Quit date: 03/15/1977    Years since quitting: 44.0   Smokeless tobacco: Never   Tobacco comments:    quit 35 years ago  Vaping Use   Vaping Use: Never used  Substance Use Topics   Alcohol use: Yes    Alcohol/week: 3.0 standard drinks    Types: 3 Glasses of wine per week    Comment: approx 1 drink/night   Drug use: No    Home Medications Prior to Admission medications   Medication  Sig Start Date End Date Taking? Authorizing Provider  amoxicillin-clavulanate (AUGMENTIN) 875-125 MG tablet Take 1 tablet by mouth every 12 (twelve) hours. 03/05/21  Yes Rayna Sexton, PA-C  daratumumab (DARZALEX) 100 MG/5ML Inject into the vein.    [provider]  dexamethasone (DECADRON) 2 MG tablet Take 4 mg by mouth daily.    [provider]  HYDROcodone-acetaminophen (NORCO/VICODIN) 5-325 MG tablet Take 2 tablets by mouth every 4 (four) hours as needed. 12/04/20   Rancour, Annie Main, MD  HYDROcodone-acetaminophen (NORCO/VICODIN) 5-325 MG tablet Take 1-2 tablets by mouth every 6 (six) hours as needed. 12/08/20   Raynelle Bring, MD  ondansetron (ZOFRAN ODT) 4 MG disintegrating tablet Take 1 tablet (4 mg total) by mouth every 8 (eight) hours as needed for nausea or vomiting. 12/04/20   Rancour, Annie Main, MD  propranolol ER (INDERAL LA) 60 MG 24 hr capsule Take 60 mg by mouth daily.    [provider]  tamsulosin (FLOMAX) 0.4 MG CAPS capsule Take 1 capsule (0.4 mg total) by mouth daily. 12/04/20   Rancour, Annie Main, MD  Testosterone 20.25 MG/ACT (1.62%) GEL APPLY 4 PUMPS TO SKIN IN THE MORNING (APPLY AFTER SHOWER OR BATH TO CLEAN, DRY, INTACT SKIN ON UPPER ARM OR SHOULDER AREAS ONLY) 06/13/20   [provider]  zolpidem (AMBIEN) 5 MG tablet  01/15/20   [provider]    Allergies    Penicillins  Review of Systems   Review of Systems  Constitutional:  Negative for chills and fever.  HENT:  Negative for congestion and sore throat.   Respiratory:  Positive for cough. Negative for shortness of breath.   Cardiovascular:  Negative for chest pain.  Gastrointestinal:  Negative for diarrhea and vomiting.   Physical Exam Updated Vital Signs BP 126/85 (BP Location: Right Arm)    Pulse 85    Temp 98 F (36.7 C) (Oral)    Resp 18    Ht $R'5\' 6"'HN$  (1.676 m)    Wt 63.5 kg    SpO2 99%    BMI 22.60 kg/m   Physical Exam Vitals and nursing note reviewed.   Constitutional:      General: He is not in acute distress.    Appearance: Normal appearance. He is not ill-appearing, toxic-appearing or diaphoretic.  HENT:     Head: Normocephalic and atraumatic.     Right Ear: External ear normal.     Left Ear: External ear normal.     Nose: Nose normal.     Mouth/Throat:     Mouth: Mucous membranes are moist.     Pharynx: Oropharynx is clear. No oropharyngeal exudate or posterior oropharyngeal erythema.  Eyes:     General: No scleral icterus.       Right eye:  No discharge.        Left eye: No discharge.     Extraocular Movements: Extraocular movements intact.     Conjunctiva/sclera: Conjunctivae normal.  Cardiovascular:     Rate and Rhythm: Normal rate and regular rhythm.     Pulses: Normal pulses.     Heart sounds: Murmur heard.    No friction rub. No gallop.  Pulmonary:     Effort: Pulmonary effort is normal. No respiratory distress.     Breath sounds: Normal breath sounds. No stridor. No wheezing, rhonchi or rales.     Comments: LCTAB without wheezing, rales or rhonchi. Abdominal:     General: Abdomen is flat.     Palpations: Abdomen is soft.     Tenderness: There is no abdominal tenderness.  Musculoskeletal:        General: Normal range of motion.     Cervical back: Normal range of motion and neck supple. No tenderness.  Skin:    General: Skin is warm and dry.  Neurological:     General: No focal deficit present.     Mental Status: He is alert and oriented to person, place, and time.  Psychiatric:        Mood and Affect: Mood normal.        Behavior: Behavior normal.   ED Results / Procedures / Treatments   Labs (all labs ordered are listed, but only abnormal results are displayed) Labs Reviewed - No data to display  EKG None  Radiology DG Chest 2 View  Result Date: 03/05/2021 CLINICAL DATA:  Cough. EXAM: CHEST - 2 VIEW COMPARISON:  February 08, 2021 FINDINGS: Mild, diffuse, chronic appearing increased lung markings are  noted. Mild atelectasis is seen within the left lung base. There is no evidence of a pleural effusion or pneumothorax. The heart size and mediastinal contours are within normal limits. The visualized skeletal structures are unremarkable. IMPRESSION: Chronic appearing increased lung markings with mild left basilar atelectasis. Electronically Signed   By: Virgina Norfolk M.D.   On: 03/05/2021 20:18    Procedures Procedures   Medications Ordered in ED Medications - No data to display  ED Course  I have reviewed the triage vital signs and the nursing notes.  Pertinent labs & imaging results that were available during my care of the patient were reviewed by me and considered in my medical decision making (see chart for details).    MDM Rules/Calculators/A&P                          Patient is a 75 year old nontoxic-appearing male who presents to the emergency department for reevaluation of a productive cough for the past month.  Patient was initially diagnosed with pneumonia about 1 month ago and discharged on a course of Augmentin.  States his URI symptoms improved except he has continued to have a productive cough with white sputum.  On my exam lungs are clear to auscultation bilaterally.  Patient afebrile and not tachycardic.  Heart is regular rate and rhythm.  Chest x-ray shows chronic appearing increased lung markings with mild left basilar atelectasis.  Although chest x-ray does not show infiltrates, will discharge on an additional course of Augmentin given patient's age, immunocompromise status, as well as his persistent productive cough.  This was discussed with the patient as well as his wife at bedside who are agreeable.  We discussed return precautions.  Feel that he is stable for discharge at this time  and he is agreeable.  His questions were answered and he was amicable at the time of discharge.  Final Clinical Impression(s) / ED Diagnoses Final diagnoses:  Productive cough   Rx  / DC Orders ED Discharge Orders          Ordered    amoxicillin-clavulanate (AUGMENTIN) 875-125 MG tablet  Every 12 hours        03/05/21 2123             Rayna Sexton, PA-C 03/05/21 2345    Tegeler, Gwenyth Allegra, MD 03/06/21 [phone]

## 2021-03-05 NOTE — ED Notes (Addendum)
Receives chemotherapy at [facility].  When he went for chemo yesterday he was told that he could not receive treatment due to persisting pneumonia and he needed to follow up with MD who prescribed antibiotic.    02/08/21  He had been in to the ER due to a fall and was found to have pneumonia and started on Augmentin and Doxycycline for 5 days by Dr. Laverta Baltimore.

## 2021-03-05 NOTE — Discharge Instructions (Signed)
I am prescribing you a new course of Augmentin.  Please take this twice a day for 10 days.  If you develop any new or worsening symptoms please come back to the emergency department immediately.  It was a pleasure to meet you and your wife.

## 2021-03-12 ENCOUNTER — Other Ambulatory Visit: Payer: Self-pay

## 2021-03-12 ENCOUNTER — Ambulatory Visit
Admission: RE | Admit: 2021-03-12 | Discharge: 2021-03-12 | Disposition: A | Discharge status: Home / Self Care | Payer: Medicare Other | Source: Ambulatory Visit | Attending: Orthopedic Surgery | Admitting: Orthopedic Surgery

## 2021-03-12 ENCOUNTER — Telehealth: Payer: Self-pay | Admitting: Orthopedic Surgery

## 2021-03-12 DIAGNOSIS — M76821 Posterior tibial tendinitis, right leg: Secondary | ICD-10-CM

## 2021-03-12 DIAGNOSIS — M25571 Pain in right ankle and joints of right foot: Secondary | ICD-10-CM

## 2021-03-12 NOTE — Telephone Encounter (Signed)
Pt needs to resch his surg appt from the 4th until after the 19th. Pt stated he is finishing up his cancer procedures at [facility] and will have them completed by Jan 19th. Pt would like to get resch'd for the soonest opening after the 19th if possible. Pt stated he had the MRI done 03/12/21 and completed so it should be getting the results into his chart soon. The best call back number is [phone].

## 2021-03-17 ENCOUNTER — Other Ambulatory Visit (HOSPITAL_COMMUNITY): Payer: Medicare Other

## 2021-03-26 ENCOUNTER — Telehealth: Payer: Self-pay

## 2021-03-26 NOTE — Telephone Encounter (Signed)
Pt is scheduled for a right subtalar talonavicular fusion  04/15/2021 he had an a MRI 03/12/21 and would like for [facility] to call and review the results before he will proceed with surgery.

## 2021-03-27 ENCOUNTER — Telehealth: Payer: Self-pay | Admitting: Orthopedic Surgery

## 2021-03-27 NOTE — Telephone Encounter (Signed)
You spoke with this pt last night about his MRI and procedure. Pt is wanting to receive a call so you can explain what the surgery is and details about the procedure.

## 2021-03-27 NOTE — Telephone Encounter (Signed)
Patient called asked if he can get a call back explaining what the procedure he is having is?   Patient said he is trying to let the people know that will be caring for him after he leaves the hospital. The number to contact patient is [phone]

## 2021-04-09 NOTE — Progress Notes (Signed)
Surgical Instructions    Your procedure is scheduled on Wednesday February 1st.  Report to [facility] Main Entrance "A" at 8 A.M., then check in with the Admitting office.  Call this number if you have problems the morning of surgery:  [phone]   If you have any questions prior to your surgery date call [phone]: Open Monday-Friday 8am-4pm    Remember:  Do not eat after midnight the night before your surgery  You may drink clear liquids until 7am the morning of your surgery.   Clear liquids allowed are: Water, Non-Citrus Juices (without pulp), Carbonated Beverages, Clear Tea, Black Coffee ONLY (NO MILK, CREAM OR POWDERED CREAMER of any kind), and Gatorade   Enhanced Recovery after Surgery for Orthopedics Enhanced Recovery after Surgery is a protocol used to improve the stress on your body and your recovery after surgery.  Patient Instructions  The day of surgery (if you do NOT have diabetes):  Drink ONE (1) Pre-Surgery Clear Ensure by __7___ am the morning of surgery   This drink was given to you during your hospital  pre-op appointment visit. Nothing else to drink after completing the  Pre-Surgery Clear Ensure.          If you have questions, please contact your surgeons office.     Take these medicines the morning of surgery with A SIP OF WATER amoxicillin-clavulanate (AUGMENTIN) 875-125 MG tablet dexamethasone (DECADRON) 2 MG tablet propranolol ER (INDERAL LA) 60 MG 24 hr capsule tamsulosin (FLOMAX) 0.4 MG CAPS capsule  IF NEEDED  HYDROcodone-acetaminophen (NORCO/VICODIN) 5-325 MG tablet ondansetron (ZOFRAN ODT) 4 MG disintegrating tablet    As of today, STOP taking any Aspirin (unless otherwise instructed by your surgeon) Aleve, Naproxen, Ibuprofen, Motrin, Advil, Goody's, BC's, all herbal medications, fish oil, and all vitamins.  After your COVID test   You are not required to quarantine however you are required to wear a well-fitting mask when you are  out and around people not in your household.  If your mask becomes wet or soiled, replace with a new one.  Wash your hands often with soap and water for 20 seconds or clean your hands with an alcohol-based hand sanitizer that contains at least 60% alcohol.  Do not share personal items.  Notify your provider: if you are in close contact with someone who has COVID  or if you develop a fever of 100.4 or greater, sneezing, cough, sore throat, shortness of breath or body aches.           Do not wear jewelry  Do not wear lotions, powders, colognes, or deodorant. Do not shave 48 hours prior to surgery.  Men may shave face and neck. Do not bring valuables to the hospital. DO Not wear nail polish, gel polish, artificial nails, or any other type of covering on natural nails (fingers and toes) If you have artificial nails or gel coating that need to be removed by a nail salon, please have this removed prior to surgery. Artificial nails or gel coating may interfere with anesthesia's ability to adequately monitor your vital signs.             [facility] is not responsible for any belongings or valuables.  Do NOT Smoke (Tobacco/Vaping)  24 hours prior to your procedure  If you use a CPAP at night, you may bring your mask for your overnight stay.   Contacts, glasses, hearing aids, dentures or partials may not be worn into surgery, please bring cases for these  belongings   For patients admitted to the hospital, discharge time will be determined by your treatment team.   Patients discharged the day of surgery will not be allowed to drive home, and someone needs to stay with them for 24 hours.  NO VISITORS WILL BE ALLOWED IN PRE-OP WHERE PATIENTS ARE PREPPED FOR SURGERY.  ONLY 1 SUPPORT PERSON MAY BE PRESENT IN THE WAITING ROOM WHILE YOU ARE IN SURGERY.  IF YOU ARE TO BE ADMITTED, ONCE YOU ARE IN YOUR ROOM YOU WILL BE ALLOWED TWO (2) VISITORS. 1 (ONE) VISITOR MAY STAY OVERNIGHT BUT MUST ARRIVE TO THE  ROOM BY 8pm.  Minor children may have two parents present. Special consideration for safety and communication needs will be reviewed on a case by case basis.  Special instructions:    Oral Hygiene is also important to reduce your risk of infection.  Remember - BRUSH YOUR TEETH THE MORNING OF SURGERY WITH YOUR REGULAR TOOTHPASTE   [facility]- Preparing For Surgery  Before surgery, you can play an important role. Because skin is not sterile, your skin needs to be as free of germs as possible. You can reduce the number of germs on your skin by washing with CHG (chlorahexidine gluconate) Soap before surgery.  CHG is an antiseptic cleaner which kills germs and bonds with the skin to continue killing germs even after washing.     Please do not use if you have an allergy to CHG or antibacterial soaps. If your skin becomes reddened/irritated stop using the CHG.  Do not shave (including legs and underarms) for at least 48 hours prior to first CHG shower. It is OK to shave your face.  Please follow these instructions carefully.     Shower the NIGHT BEFORE SURGERY and the MORNING OF SURGERY with CHG Soap.   If you chose to wash your hair, wash your hair first as usual with your normal shampoo. After you shampoo, rinse your hair and body thoroughly to remove the shampoo.  Then [employer] and genitals (private parts) with your normal soap and rinse thoroughly to remove soap.  After that Use CHG Soap as you would any other liquid soap. You can apply CHG directly to the skin and wash gently with a scrungie or a clean washcloth.   Apply the CHG Soap to your body ONLY FROM THE NECK DOWN.  Do not use on open wounds or open sores. Avoid contact with your eyes, ears, mouth and genitals (private parts). Wash Face and genitals (private parts)  with your normal soap.   Wash thoroughly, paying special attention to the area where your surgery will be performed.  Thoroughly rinse your body with warm water from the  neck down.  DO NOT shower/wash with your normal soap after using and rinsing off the CHG Soap.  Pat yourself dry with a CLEAN TOWEL.  Wear CLEAN PAJAMAS to bed the night before surgery  Place CLEAN SHEETS on your bed the night before your surgery  DO NOT SLEEP WITH PETS.   Day of Surgery:  Take a shower with CHG soap. Wear Clean/Comfortable clothing the morning of surgery Do not apply any deodorants/lotions.   Remember to brush your teeth WITH YOUR REGULAR TOOTHPASTE.   Please read over the following fact sheets that you were given.

## 2021-04-10 ENCOUNTER — Encounter (HOSPITAL_COMMUNITY): Payer: Self-pay

## 2021-04-10 ENCOUNTER — Encounter (HOSPITAL_COMMUNITY)
Admission: RE | Admit: 2021-04-10 | Discharge: 2021-04-10 | Disposition: A | Discharge status: Home / Self Care | Payer: Medicare Other | Source: Ambulatory Visit | Attending: Orthopedic Surgery | Admitting: Orthopedic Surgery

## 2021-04-10 ENCOUNTER — Other Ambulatory Visit: Payer: Self-pay

## 2021-04-10 VITALS — BP 148/82 | HR 66 | Temp 97.2°F | Resp 17 | Ht 66.0 in | Wt 143.0 lb

## 2021-04-10 DIAGNOSIS — E119 Type 2 diabetes mellitus without complications: Secondary | ICD-10-CM | POA: Diagnosis not present

## 2021-04-10 DIAGNOSIS — Z01812 Encounter for preprocedural laboratory examination: Secondary | ICD-10-CM | POA: Insufficient documentation

## 2021-04-10 DIAGNOSIS — Z01818 Encounter for other preprocedural examination: Secondary | ICD-10-CM

## 2021-04-10 HISTORY — DX: Pneumonia, unspecified organism: J18.9

## 2021-04-10 HISTORY — DX: Type 2 diabetes mellitus without complications: E11.9

## 2021-04-10 LAB — GLUCOSE, CAPILLARY: Glucose-Capillary: 138 mg/dL — ABNORMAL HIGH (ref 70–99)

## 2021-04-10 LAB — HEMOGLOBIN A1C
Hgb A1c MFr Bld: 7.8 % — ABNORMAL HIGH (ref 4.8–5.6)
Mean Plasma Glucose: 177.16 mg/dL

## 2021-04-10 LAB — SURGICAL PCR SCREEN
MRSA, PCR: NEGATIVE
Staphylococcus aureus: POSITIVE — AB

## 2021-04-10 NOTE — Progress Notes (Signed)
Surgical Instructions    Your procedure is scheduled on Wednesday February 1st.  Report to [facility] Main Entrance "A" at 8 A.M., then check in with the Admitting office.  Call this number if you have problems the morning of surgery:  [phone]   If you have any questions prior to your surgery date call [phone]: Open Monday-Friday 8am-4pm    Remember:  Do not eat after midnight the night before your surgery  You may drink clear liquids until 7am the morning of your surgery.   Clear liquids allowed are: Water, Non-Citrus Juices (without pulp), Carbonated Beverages, Clear Tea, Black Coffee ONLY (NO MILK, CREAM OR POWDERED CREAMER of any kind), and Gatorade   Enhanced Recovery after Surgery for Orthopedics Enhanced Recovery after Surgery is a protocol used to improve the stress on your body and your recovery after surgery.  Patient Instructions  The day of surgery (if you do NOT have diabetes):  Drink ONE (1) Pre-Surgery Clear Ensure by __7___ am the morning of surgery   This drink was given to you during your hospital  pre-op appointment visit. Nothing else to drink after completing the  Pre-Surgery Clear Ensure.          If you have questions, please contact your surgeons office.     Take these medicines the morning of surgery with A SIP OF WATER amoxicillin-clavulanate (AUGMENTIN) 875-125 MG tablet dexamethasone (DECADRON) 2 MG tablet propranolol ER (INDERAL LA) 60 MG 24 hr capsule tamsulosin (FLOMAX) 0.4 MG CAPS capsule  IF NEEDED  HYDROcodone-acetaminophen (NORCO/VICODIN) 5-325 MG tablet ondansetron (ZOFRAN ODT) 4 MG disintegrating tablet    As of today, STOP taking any Aspirin (unless otherwise instructed by your surgeon) Aleve, Naproxen, Ibuprofen, Motrin, Advil, Goody's, BC's, all herbal medications, fish oil, and all vitamins.    Your pre surgery Covid test is scheduled for April 13 2021 at 1:50 pm. This is a visit at [facility]  [address] [place] [address]. Please arrive at the [facility]. Please inform the Valet parking team that you are here for a pre-op Covid test and that the appointment is a 5 minute appointment and the patient (space permitting) should be allowed to park in the lot beside the [place] stand.  The patient should then proceed to the pre-admitting testing department.  After your COVID test   You are not required to quarantine however you are required to wear a well-fitting mask when you are out and around people not in your household.  If your mask becomes wet or soiled, replace with a new one.  Wash your hands often with soap and water for 20 seconds or clean your hands with an alcohol-based hand sanitizer that contains at least 60% alcohol.  Do not share personal items.  Notify your provider: if you are in close contact with someone who has COVID  or if you develop a fever of 100.4 or greater, sneezing, cough, sore throat, shortness of breath or body aches.           Do not wear jewelry  Do not wear lotions, powders, colognes, or deodorant. Do not shave 48 hours prior to surgery.  Men may shave face and neck. Do not bring valuables to the hospital. DO Not wear nail polish, gel polish, artificial nails, or any other type of covering on natural nails (fingers and toes) If you have artificial nails or gel coating that need to be removed by a nail salon, please  have this removed prior to surgery. Artificial nails or gel coating may interfere with anesthesia's ability to adequately monitor your vital signs.             [facility] is not responsible for any belongings or valuables.  Do NOT Smoke (Tobacco/Vaping)  24 hours prior to your procedure  If you use a CPAP at night, you may bring your mask for your overnight stay.   Contacts, glasses, hearing aids, dentures or partials may not be worn into surgery, please bring cases for these belongings   For patients admitted to  the hospital, discharge time will be determined by your treatment team.   Patients discharged the day of surgery will not be allowed to drive home, and someone needs to stay with them for 24 hours.  NO VISITORS WILL BE ALLOWED IN PRE-OP WHERE PATIENTS ARE PREPPED FOR SURGERY.  ONLY 1 SUPPORT PERSON MAY BE PRESENT IN THE WAITING ROOM WHILE YOU ARE IN SURGERY.  IF YOU ARE TO BE ADMITTED, ONCE YOU ARE IN YOUR ROOM YOU WILL BE ALLOWED TWO (2) VISITORS. 1 (ONE) VISITOR MAY STAY OVERNIGHT BUT MUST ARRIVE TO THE ROOM BY 8pm.  Minor children may have two parents present. Special consideration for safety and communication needs will be reviewed on a case by case basis.  Special instructions:    Oral Hygiene is also important to reduce your risk of infection.  Remember - BRUSH YOUR TEETH THE MORNING OF SURGERY WITH YOUR REGULAR TOOTHPASTE   [facility]- Preparing For Surgery  Before surgery, you can play an important role. Because skin is not sterile, your skin needs to be as free of germs as possible. You can reduce the number of germs on your skin by washing with CHG (chlorahexidine gluconate) Soap before surgery.  CHG is an antiseptic cleaner which kills germs and bonds with the skin to continue killing germs even after washing.     Please do not use if you have an allergy to CHG or antibacterial soaps. If your skin becomes reddened/irritated stop using the CHG.  Do not shave (including legs and underarms) for at least 48 hours prior to first CHG shower. It is OK to shave your face.  Please follow these instructions carefully.     Shower the NIGHT BEFORE SURGERY and the MORNING OF SURGERY with CHG Soap.   If you chose to wash your hair, wash your hair first as usual with your normal shampoo. After you shampoo, rinse your hair and body thoroughly to remove the shampoo.  Then [employer] and genitals (private parts) with your normal soap and rinse thoroughly to remove soap.  After that Use CHG Soap as  you would any other liquid soap. You can apply CHG directly to the skin and wash gently with a scrungie or a clean washcloth.   Apply the CHG Soap to your body ONLY FROM THE NECK DOWN.  Do not use on open wounds or open sores. Avoid contact with your eyes, ears, mouth and genitals (private parts). Wash Face and genitals (private parts)  with your normal soap.   Wash thoroughly, paying special attention to the area where your surgery will be performed.  Thoroughly rinse your body with warm water from the neck down.  DO NOT shower/wash with your normal soap after using and rinsing off the CHG Soap.  Pat yourself dry with a CLEAN TOWEL.  Wear CLEAN PAJAMAS to bed the night before surgery  Place CLEAN SHEETS on your  bed the night before your surgery  DO NOT SLEEP WITH PETS.   Day of Surgery:  Take a shower with CHG soap. Wear Clean/Comfortable clothing the morning of surgery Do not apply any deodorants/lotions.   Remember to brush your teeth WITH YOUR REGULAR TOOTHPASTE.   Please read over the following fact sheets that you were given.

## 2021-04-10 NOTE — Progress Notes (Signed)
Your pre surgery Covid test is scheduled for ... This is a visit at [facility] [address] [place] [address]. Please arrive at the [facility]. Please inform the Valet parking team that you are here for a pre-op Covid test and that the appointment is a 5 minute appointment and the patient (space permitting) should be allowed to park in the lot beside the [place] stand.  The patient should then proceed to the pre-admitting testing department.Surgical Instructions    Your procedure is scheduled on Wednesday February 1st.  Report to [facility] Main Entrance "A" at 8 A.M., then check in with the Admitting office.  Call this number if you have problems the morning of surgery:  [phone]   If you have any questions prior to your surgery date call [phone]: Open Monday-Friday 8am-4pm    Remember:  Do not eat after midnight the night before your surgery  You may drink clear liquids until 7am the morning of your surgery.   Clear liquids allowed are: Water, Non-Citrus Juices (without pulp), Carbonated Beverages, Clear Tea, Black Coffee ONLY (NO MILK, CREAM OR POWDERED CREAMER of any kind), and Gatorade   Enhanced Recovery after Surgery for Orthopedics Enhanced Recovery after Surgery is a protocol used to improve the stress on your body and your recovery after surgery.  Patient Instructions  The day of surgery (if you do NOT have diabetes):  Drink ONE (1) Pre-Surgery Clear Ensure by __7___ am the morning of surgery   This drink was given to you during your hospital  pre-op appointment visit. Nothing else to drink after completing the  Pre-Surgery Clear Ensure.          If you have questions, please contact your surgeons office.     Take these medicines the morning of surgery with A SIP OF WATER amoxicillin-clavulanate (AUGMENTIN) 875-125 MG tablet dexamethasone (DECADRON) 2 MG tablet propranolol ER (INDERAL LA) 60 MG 24 hr  capsule tamsulosin (FLOMAX) 0.4 MG CAPS capsule  IF NEEDED  HYDROcodone-acetaminophen (NORCO/VICODIN) 5-325 MG tablet ondansetron (ZOFRAN ODT) 4 MG disintegrating tablet    As of today, STOP taking any Aspirin (unless otherwise instructed by your surgeon) Aleve, Naproxen, Ibuprofen, Motrin, Advil, Goody's, BC's, all herbal medications, fish oil, and all vitamins.    Your pre surgery Covid test is scheduled for April 13 2021 at 1:50 pm. This is a visit at [facility] [address] [place] [address]. Please arrive at the [facility]. Please inform the Valet parking team that you are here for a pre-op Covid test and that the appointment is a 5 minute appointment and the patient (space permitting) should be allowed to park in the lot beside the [place] stand.  The patient should then proceed to the pre-admitting testing department.  After your COVID test   You are not required to quarantine however you are required to wear a well-fitting mask when you are out and around people not in your household.  If your mask becomes wet or soiled, replace with a new one.  Wash your hands often with soap and water for 20 seconds or clean your hands with an alcohol-based hand sanitizer that contains at least 60% alcohol.  Do not share personal items.  Notify your provider: if you are in close contact with someone who has COVID  or if you develop a fever of 100.4 or greater, sneezing, cough, sore throat, shortness of breath or body aches.  Do not wear jewelry  Do not wear lotions, powders, colognes, or deodorant. Do not shave 48 hours prior to surgery.  Men may shave face and neck. Do not bring valuables to the hospital. DO Not wear nail polish, gel polish, artificial nails, or any other type of covering on natural nails (fingers and toes) If you have artificial nails or gel coating that need to be removed by a nail salon, please have this removed prior  to surgery. Artificial nails or gel coating may interfere with anesthesia's ability to adequately monitor your vital signs.             [facility] is not responsible for any belongings or valuables.  Do NOT Smoke (Tobacco/Vaping)  24 hours prior to your procedure  If you use a CPAP at night, you may bring your mask for your overnight stay.   Contacts, glasses, hearing aids, dentures or partials may not be worn into surgery, please bring cases for these belongings   For patients admitted to the hospital, discharge time will be determined by your treatment team.   Patients discharged the day of surgery will not be allowed to drive home, and someone needs to stay with them for 24 hours.  NO VISITORS WILL BE ALLOWED IN PRE-OP WHERE PATIENTS ARE PREPPED FOR SURGERY.  ONLY 1 SUPPORT PERSON MAY BE PRESENT IN THE WAITING ROOM WHILE YOU ARE IN SURGERY.  IF YOU ARE TO BE ADMITTED, ONCE YOU ARE IN YOUR ROOM YOU WILL BE ALLOWED TWO (2) VISITORS. 1 (ONE) VISITOR MAY STAY OVERNIGHT BUT MUST ARRIVE TO THE ROOM BY 8pm.  Minor children may have two parents present. Special consideration for safety and communication needs will be reviewed on a case by case basis.  Special instructions:    Oral Hygiene is also important to reduce your risk of infection.  Remember - BRUSH YOUR TEETH THE MORNING OF SURGERY WITH YOUR REGULAR TOOTHPASTE   [facility] [facility]- Preparing For Surgery  Before surgery, you can play an important role. Because skin is not sterile, your skin needs to be as free of germs as possible. You can reduce the number of germs on your skin by washing with CHG (chlorahexidine gluconate) Soap before surgery.  CHG is an antiseptic cleaner which kills germs and bonds with the skin to continue killing germs even after washing.     Please do not use if you have an allergy to CHG or antibacterial soaps. If your skin becomes reddened/irritated stop using the CHG.  Do not shave (including legs and underarms) for  at least 48 hours prior to first CHG shower. It is OK to shave your face.  Please follow these instructions carefully.     Shower the NIGHT BEFORE SURGERY and the MORNING OF SURGERY with CHG Soap.   If you chose to wash your hair, wash your hair first as usual with your normal shampoo. After you shampoo, rinse your hair and body thoroughly to remove the shampoo.  Then [employer] and genitals (private parts) with your normal soap and rinse thoroughly to remove soap.  After that Use CHG Soap as you would any other liquid soap. You can apply CHG directly to the skin and wash gently with a scrungie or a clean washcloth.   Apply the CHG Soap to your body ONLY FROM THE NECK DOWN.  Do not use on open wounds or open sores. Avoid contact with your eyes, ears, mouth and genitals (private parts). Wash Face and genitals (private  parts)  with your normal soap.   Wash thoroughly, paying special attention to the area where your surgery will be performed.  Thoroughly rinse your body with warm water from the neck down.  DO NOT shower/wash with your normal soap after using and rinsing off the CHG Soap.  Pat yourself dry with a CLEAN TOWEL.  Wear CLEAN PAJAMAS to bed the night before surgery  Place CLEAN SHEETS on your bed the night before your surgery  DO NOT SLEEP WITH PETS.   Day of Surgery:  Take a shower with CHG soap. Wear Clean/Comfortable clothing the morning of surgery Do not apply any deodorants/lotions.   Remember to brush your teeth WITH YOUR REGULAR TOOTHPASTE.   Please read over the following fact sheets that you were given.

## 2021-04-10 NOTE — Progress Notes (Signed)
PCP - Ermalinda Memos Cardiologist - Hochrein MD  PPM/ICD - denies Device Orders -  Rep Notified -   Chest x-ray -  EKG - 12/04/20 Stress Test - 04/27/15 ECHO - 10/02/20 Cardiac Cath - 06/13/15  Sleep Study - no CPAP -   Fasting Blood Sugar - 150-160 Checks Blood Sugar __2 times a day  Blood Thinner Instructions:n/a Aspirin Instructions:no  ERAS Protcol -clears until 0700 PRE-SURGERY Ensure or G2- G2  COVID TEST- scheduled for 04/13/21   Anesthesia review: no  Patient denies shortness of breath, fever, cough and chest pain at PAT appointment   All instructions explained to the patient, with a verbal understanding of the material. Patient agrees to go over the instructions while at home for a better understanding. Patient also instructed to self quarantine after being tested for COVID-19. The opportunity to ask questions was provided.

## 2021-04-10 NOTE — Progress Notes (Signed)
Surgical Instructions    Your procedure is scheduled on Wednesday February 1st.  Report to [facility] Main Entrance "A" at 8 A.M., then check in with the Admitting office.  Call this number if you have problems the morning of surgery:  [phone]   If you have any questions prior to your surgery date call [phone]: Open Monday-Friday 8am-4pm    Remember:  Do not eat after midnight the night before your surgery  You may drink clear liquids until 7am the morning of your surgery.   Clear liquids allowed are: Water, Non-Citrus Juices (without pulp), Carbonated Beverages, Clear Tea, Black Coffee ONLY (NO MILK, CREAM OR POWDERED CREAMER of any kind), and Gatorade   Enhanced Recovery after Surgery for Orthopedics Enhanced Recovery after Surgery is a protocol used to improve the stress on your body and your recovery after surgery.  Patient Instructions  The day of surgery (if you do have diabetes):  Drink ONE (1) Pre-Surgery G2 by __7___ am the morning of surgery   This drink was given to you during your hospital  pre-op appointment visit. Nothing else to drink after completing the  Pre-Surgery G2.          If you have questions, please contact your surgeons office.     Take these medicines the morning of surgery with A SIP OF WATER amoxicillin-clavulanate (AUGMENTIN) 875-125 MG tablet propranolol ER (INDERAL LA) 60 MG 24 hr capsule tamsulosin (FLOMAX) 0.4 MG CAPS capsule  IF NEEDED  HYDROcodone-acetaminophen (NORCO/VICODIN) 5-325 MG tablet ondansetron (ZOFRAN ODT) 4 MG disintegrating tablet   As of today, STOP taking any Aspirin (unless otherwise instructed by your surgeon) Aleve, Naproxen, Ibuprofen, Motrin, Advil, Goody's, BC's, all herbal medications, fish oil, and all vitamins.  WHAT DO I DO ABOUT MY DIABETES MEDICATION?   Do not take oral diabetes medicines (pills) the morning of surgery.  THE NIGHT BEFORE SURGERY, take ___________ units of  ___________insulin.       THE MORNING OF SURGERY, take _____________ units of __________insulin.  The day of surgery, do not take other diabetes injectables, including Byetta (exenatide), Bydureon (exenatide ER), Victoza (liraglutide), or Trulicity (dulaglutide).  If your CBG is greater than 220 mg/dL, you may take  of your sliding scale (correction) dose of insulin.   HOW TO MANAGE YOUR DIABETES BEFORE AND AFTER SURGERY  Why is it important to control my blood sugar before and after surgery? Improving blood sugar levels before and after surgery helps healing and can limit problems. A way of improving blood sugar control is eating a healthy diet by:  Eating less sugar and carbohydrates  Increasing activity/exercise  Talking with your doctor about reaching your blood sugar goals High blood sugars (greater than 180 mg/dL) can raise your risk of infections and slow your recovery, so you will need to focus on controlling your diabetes during the weeks before surgery. Make sure that the doctor who takes care of your diabetes knows about your planned surgery including the date and location.  How do I manage my blood sugar before surgery? Check your blood sugar at least 4 times a day, starting 2 days before surgery, to make sure that the level is not too high or low.  Check your blood sugar the morning of your surgery when you wake up and every 2 hours until you get to the Short Stay unit.  If your blood sugar is less than 70 mg/dL, you will need to treat for low blood sugar: Do not take insulin.  Treat a low blood sugar (less than 70 mg/dL) with  cup of clear juice (cranberry or apple), 4 glucose tablets, OR glucose gel. Recheck blood sugar in 15 minutes after treatment (to make sure it is greater than 70 mg/dL). If your blood sugar is not greater than 70 mg/dL on recheck, call [phone] for further instructions. Report your blood sugar to the short stay nurse when you get to Short  Stay.  If you are admitted to the hospital after surgery: Your blood sugar will be checked by the staff and you will probably be given insulin after surgery (instead of oral diabetes medicines) to make sure you have good blood sugar levels. The goal for blood sugar control after surgery is 80-180 mg/dL.   Your pre surgery Covid test is scheduled for April 13 2021 at 1:50 pm. This is a visit at [facility] [address] [place] [address]. Please arrive at the [facility]. Please inform the [facility] parking team that you are here for a pre-op Covid test and that the appointment is a 5 minute appointment and the patient (space permitting) should be allowed to park in the lot beside the [place] stand.  The patient should then proceed to the pre-admitting testing department.  After your COVID test   You are not required to quarantine however you are required to wear a well-fitting mask when you are out and around people not in your household.  If your mask becomes wet or soiled, replace with a new one.  Wash your hands often with soap and water for 20 seconds or clean your hands with an alcohol-based hand sanitizer that contains at least 60% alcohol.  Do not share personal items.  Notify your provider: if you are in close contact with someone who has COVID  or if you develop a fever of 100.4 or greater, sneezing, cough, sore throat, shortness of breath or body aches.           Do not wear jewelry  Do not wear lotions, powders, colognes, or deodorant. Do not shave 48 hours prior to surgery.  Men may shave face and neck. Do not bring valuables to the hospital. DO Not wear nail polish, gel polish, artificial nails, or any other type of covering on natural nails (fingers and toes) If you have artificial nails or gel coating that need to be removed by a nail salon, please have this removed prior to surgery. Artificial nails or gel coating may interfere with  anesthesia's ability to adequately monitor your vital signs.             [facility] is not responsible for any belongings or valuables.  Do NOT Smoke (Tobacco/Vaping)  24 hours prior to your procedure  If you use a CPAP at night, you may bring your mask for your overnight stay.   Contacts, glasses, hearing aids, dentures or partials may not be worn into surgery, please bring cases for these belongings   For patients admitted to the hospital, discharge time will be determined by your treatment team.   Patients discharged the day of surgery will not be allowed to drive home, and someone needs to stay with them for 24 hours.  NO VISITORS WILL BE ALLOWED IN PRE-OP WHERE PATIENTS ARE PREPPED FOR SURGERY.  ONLY 1 SUPPORT PERSON MAY BE PRESENT IN THE WAITING ROOM WHILE YOU ARE IN SURGERY.  IF YOU ARE TO BE ADMITTED, ONCE YOU ARE IN YOUR ROOM YOU  WILL BE ALLOWED TWO (2) VISITORS. 1 (ONE) VISITOR MAY STAY OVERNIGHT BUT MUST ARRIVE TO THE ROOM BY 8pm.  Minor children may have two parents present. Special consideration for safety and communication needs will be reviewed on a case by case basis.  Special instructions:    Oral Hygiene is also important to reduce your risk of infection.  Remember - BRUSH YOUR TEETH THE MORNING OF SURGERY WITH YOUR REGULAR TOOTHPASTE   [facility]- Preparing For Surgery  Before surgery, you can play an important role. Because skin is not sterile, your skin needs to be as free of germs as possible. You can reduce the number of germs on your skin by washing with CHG (chlorahexidine gluconate) Soap before surgery.  CHG is an antiseptic cleaner which kills germs and bonds with the skin to continue killing germs even after washing.     Please do not use if you have an allergy to CHG or antibacterial soaps. If your skin becomes reddened/irritated stop using the CHG.  Do not shave (including legs and underarms) for at least 48 hours prior to first CHG shower. It is OK to shave  your face.  Please follow these instructions carefully.     Shower the NIGHT BEFORE SURGERY and the MORNING OF SURGERY with CHG Soap.   If you chose to wash your hair, wash your hair first as usual with your normal shampoo. After you shampoo, rinse your hair and body thoroughly to remove the shampoo.  Then [employer] and genitals (private parts) with your normal soap and rinse thoroughly to remove soap.  After that Use CHG Soap as you would any other liquid soap. You can apply CHG directly to the skin and wash gently with a scrungie or a clean washcloth.   Apply the CHG Soap to your body ONLY FROM THE NECK DOWN.  Do not use on open wounds or open sores. Avoid contact with your eyes, ears, mouth and genitals (private parts). Wash Face and genitals (private parts)  with your normal soap.   Wash thoroughly, paying special attention to the area where your surgery will be performed.  Thoroughly rinse your body with warm water from the neck down.  DO NOT shower/wash with your normal soap after using and rinsing off the CHG Soap.  Pat yourself dry with a CLEAN TOWEL.  Wear CLEAN PAJAMAS to bed the night before surgery  Place CLEAN SHEETS on your bed the night before your surgery  DO NOT SLEEP WITH PETS.   Day of Surgery:  Take a shower with CHG soap. Wear Clean/Comfortable clothing the morning of surgery Do not apply any deodorants/lotions.   Remember to brush your teeth WITH YOUR REGULAR TOOTHPASTE.   Please read over the following fact sheets that you were given.

## 2021-04-13 ENCOUNTER — Other Ambulatory Visit (HOSPITAL_COMMUNITY)
Admission: RE | Admit: 2021-04-13 | Discharge: 2021-04-13 | Disposition: A | Discharge status: Home / Self Care | Payer: Medicare Other | Source: Ambulatory Visit | Attending: Orthopedic Surgery | Admitting: Orthopedic Surgery

## 2021-04-13 ENCOUNTER — Other Ambulatory Visit (HOSPITAL_COMMUNITY): Payer: Medicare Other

## 2021-04-13 DIAGNOSIS — Z20822 Contact with and (suspected) exposure to covid-19: Secondary | ICD-10-CM | POA: Insufficient documentation

## 2021-04-13 DIAGNOSIS — Z01818 Encounter for other preprocedural examination: Secondary | ICD-10-CM

## 2021-04-13 DIAGNOSIS — Z01812 Encounter for preprocedural laboratory examination: Secondary | ICD-10-CM | POA: Insufficient documentation

## 2021-04-14 LAB — SARS CORONAVIRUS 2 (TAT 6-24 HRS): SARS Coronavirus 2: NEGATIVE

## 2021-04-15 ENCOUNTER — Encounter (HOSPITAL_COMMUNITY): Payer: Self-pay | Admitting: Orthopedic Surgery

## 2021-04-15 ENCOUNTER — Ambulatory Visit (HOSPITAL_COMMUNITY): Payer: Medicare Other | Admitting: Certified Registered"

## 2021-04-15 ENCOUNTER — Encounter (HOSPITAL_COMMUNITY)
Admission: RE | Disposition: A | Discharge status: Home / Self Care | Payer: Self-pay | Source: Home / Self Care | Attending: Orthopedic Surgery

## 2021-04-15 ENCOUNTER — Other Ambulatory Visit: Payer: Self-pay

## 2021-04-15 ENCOUNTER — Ambulatory Visit (HOSPITAL_COMMUNITY): Payer: Medicare Other | Admitting: Vascular Surgery

## 2021-04-15 ENCOUNTER — Inpatient Hospital Stay (HOSPITAL_COMMUNITY)
Admission: RE | Admit: 2021-04-15 | Discharge: 2021-04-17 | DRG: 504 | Disposition: A | Discharge status: Skilled Nursing Facility | Payer: Medicare Other | Attending: Orthopedic Surgery | Admitting: Orthopedic Surgery

## 2021-04-15 DIAGNOSIS — Z87891 Personal history of nicotine dependence: Secondary | ICD-10-CM

## 2021-04-15 DIAGNOSIS — Z79899 Other long term (current) drug therapy: Secondary | ICD-10-CM | POA: Diagnosis not present

## 2021-04-15 DIAGNOSIS — Z7984 Long term (current) use of oral hypoglycemic drugs: Secondary | ICD-10-CM

## 2021-04-15 DIAGNOSIS — Z20822 Contact with and (suspected) exposure to covid-19: Secondary | ICD-10-CM | POA: Diagnosis present

## 2021-04-15 DIAGNOSIS — Z880 Allergy status to penicillin: Secondary | ICD-10-CM

## 2021-04-15 DIAGNOSIS — E119 Type 2 diabetes mellitus without complications: Secondary | ICD-10-CM | POA: Diagnosis present

## 2021-04-15 DIAGNOSIS — M76821 Posterior tibial tendinitis, right leg: Principal | ICD-10-CM | POA: Diagnosis present

## 2021-04-15 DIAGNOSIS — Z85828 Personal history of other malignant neoplasm of skin: Secondary | ICD-10-CM

## 2021-04-15 DIAGNOSIS — Z8249 Family history of ischemic heart disease and other diseases of the circulatory system: Secondary | ICD-10-CM

## 2021-04-15 DIAGNOSIS — E785 Hyperlipidemia, unspecified: Secondary | ICD-10-CM | POA: Diagnosis present

## 2021-04-15 DIAGNOSIS — C9000 Multiple myeloma not having achieved remission: Secondary | ICD-10-CM | POA: Diagnosis present

## 2021-04-15 DIAGNOSIS — E859 Amyloidosis, unspecified: Secondary | ICD-10-CM | POA: Diagnosis present

## 2021-04-15 DIAGNOSIS — H9190 Unspecified hearing loss, unspecified ear: Secondary | ICD-10-CM | POA: Diagnosis present

## 2021-04-15 DIAGNOSIS — K219 Gastro-esophageal reflux disease without esophagitis: Secondary | ICD-10-CM | POA: Diagnosis present

## 2021-04-15 DIAGNOSIS — Z8546 Personal history of malignant neoplasm of prostate: Secondary | ICD-10-CM

## 2021-04-15 HISTORY — PX: FOOT ARTHRODESIS: SHX1655

## 2021-04-15 LAB — GLUCOSE, CAPILLARY
Glucose-Capillary: 123 mg/dL — ABNORMAL HIGH (ref 70–99)
Glucose-Capillary: 116 mg/dL — ABNORMAL HIGH (ref 70–99)
Glucose-Capillary: 112 mg/dL — ABNORMAL HIGH (ref 70–99)

## 2021-04-15 SURGERY — FUSION, JOINT, FOOT
Anesthesia: Regional | Site: Foot | Laterality: Right

## 2021-04-15 MED ORDER — TRAZODONE HCL 50 MG PO TABS: 50.0000 mg | ORAL_TABLET | Freq: Every evening | ORAL | Status: DC | PRN

## 2021-04-15 MED ORDER — INSULIN ASPART 100 UNIT/ML IJ SOLN: 0.0000 [IU] | Freq: Three times a day (TID) | INTRAMUSCULAR | Status: DC

## 2021-04-15 MED ORDER — POLYETHYLENE GLYCOL 3350 17 G PO PACK: 17.0000 g | PACK | Freq: Every day | ORAL | Status: DC | PRN

## 2021-04-15 MED ORDER — ORAL CARE MOUTH RINSE: 15.0000 mL | Freq: Once | OROMUCOSAL | Status: AC

## 2021-04-15 MED ORDER — METHOCARBAMOL 1000 MG/10ML IJ SOLN
500.0000 mg | Freq: Four times a day (QID) | INTRAVENOUS | Status: DC | PRN
  Filled 2021-04-15: qty 5

## 2021-04-15 MED ORDER — METOCLOPRAMIDE HCL 5 MG/ML IJ SOLN: 5.0000 mg | Freq: Three times a day (TID) | INTRAMUSCULAR | Status: DC | PRN

## 2021-04-15 MED ORDER — AMISULPRIDE (ANTIEMETIC) 5 MG/2ML IV SOLN: 10.0000 mg | Freq: Once | INTRAVENOUS | Status: DC | PRN

## 2021-04-15 MED ORDER — METOCLOPRAMIDE HCL 5 MG PO TABS: 5.0000 mg | ORAL_TABLET | Freq: Three times a day (TID) | ORAL | Status: DC | PRN

## 2021-04-15 MED ORDER — MIDAZOLAM HCL 2 MG/2ML IJ SOLN
INTRAMUSCULAR | Status: AC
  Filled 2021-04-15: qty 2

## 2021-04-15 MED ORDER — DOCUSATE SODIUM 100 MG PO CAPS
100.0000 mg | ORAL_CAPSULE | Freq: Two times a day (BID) | ORAL | Status: DC
  Administered 2021-04-15 – 2021-04-17 (×4): 100 mg via ORAL
  Filled 2021-04-15 (×5): qty 1

## 2021-04-15 MED ORDER — FENTANYL CITRATE (PF) 100 MCG/2ML IJ SOLN
INTRAMUSCULAR | Status: AC
  Administered 2021-04-15: 50 ug via INTRAVENOUS
  Filled 2021-04-15: qty 2

## 2021-04-15 MED ORDER — BISACODYL 10 MG RE SUPP: 10.0000 mg | Freq: Every day | RECTAL | Status: DC | PRN

## 2021-04-15 MED ORDER — SODIUM CHLORIDE 0.9 % IV SOLN: INTRAVENOUS | Status: DC

## 2021-04-15 MED ORDER — FENTANYL CITRATE (PF) 100 MCG/2ML IJ SOLN: 25.0000 ug | INTRAMUSCULAR | Status: DC | PRN

## 2021-04-15 MED ORDER — OXYCODONE HCL 5 MG PO TABS
5.0000 mg | ORAL_TABLET | ORAL | Status: DC | PRN
  Administered 2021-04-15: 10 mg via ORAL
  Filled 2021-04-15 (×3): qty 2

## 2021-04-15 MED ORDER — ONDANSETRON HCL 4 MG/2ML IJ SOLN: 4.0000 mg | Freq: Four times a day (QID) | INTRAMUSCULAR | Status: DC | PRN

## 2021-04-15 MED ORDER — 0.9 % SODIUM CHLORIDE (POUR BTL) OPTIME
TOPICAL | Status: DC | PRN
  Administered 2021-04-15: 1000 mL

## 2021-04-15 MED ORDER — HYDROMORPHONE HCL 1 MG/ML IJ SOLN: 0.5000 mg | INTRAMUSCULAR | Status: DC | PRN

## 2021-04-15 MED ORDER — CEFAZOLIN SODIUM-DEXTROSE 2-3 GM-%(50ML) IV SOLR
INTRAVENOUS | Status: DC | PRN
  Administered 2021-04-15: 2 g via INTRAVENOUS

## 2021-04-15 MED ORDER — METFORMIN HCL ER 500 MG PO TB24
500.0000 mg | ORAL_TABLET | Freq: Two times a day (BID) | ORAL | Status: DC
  Administered 2021-04-15 – 2021-04-17 (×4): 500 mg via ORAL
  Filled 2021-04-15 (×4): qty 1

## 2021-04-15 MED ORDER — ONDANSETRON HCL 4 MG/2ML IJ SOLN: 4.0000 mg | Freq: Once | INTRAMUSCULAR | Status: DC | PRN

## 2021-04-15 MED ORDER — ROPIVACAINE HCL 5 MG/ML IJ SOLN
INTRAMUSCULAR | Status: DC | PRN
  Administered 2021-04-15: 30 mL via PERINEURAL
  Administered 2021-04-15: 10 mL via PERINEURAL

## 2021-04-15 MED ORDER — ONDANSETRON HCL 4 MG PO TABS: 4.0000 mg | ORAL_TABLET | Freq: Four times a day (QID) | ORAL | Status: DC | PRN

## 2021-04-15 MED ORDER — ACETAMINOPHEN 10 MG/ML IV SOLN: 1000.0000 mg | Freq: Once | INTRAVENOUS | Status: DC | PRN

## 2021-04-15 MED ORDER — FENTANYL CITRATE (PF) 100 MCG/2ML IJ SOLN: 50.0000 ug | Freq: Once | INTRAMUSCULAR | Status: AC

## 2021-04-15 MED ORDER — CHLORHEXIDINE GLUCONATE 0.12 % MT SOLN
15.0000 mL | Freq: Once | OROMUCOSAL | Status: AC
  Administered 2021-04-15: 15 mL via OROMUCOSAL
  Filled 2021-04-15: qty 15

## 2021-04-15 MED ORDER — CLINDAMYCIN PHOSPHATE 900 MG/50ML IV SOLN
900.0000 mg | INTRAVENOUS | Status: DC
  Filled 2021-04-15: qty 50

## 2021-04-15 MED ORDER — LACTATED RINGERS IV SOLN: INTRAVENOUS | Status: DC

## 2021-04-15 MED ORDER — PROPOFOL 500 MG/50ML IV EMUL
INTRAVENOUS | Status: DC | PRN
  Administered 2021-04-15: 100 ug/kg/min via INTRAVENOUS

## 2021-04-15 MED ORDER — CEFAZOLIN SODIUM-DEXTROSE 1-4 GM/50ML-% IV SOLN
1.0000 g | Freq: Four times a day (QID) | INTRAVENOUS | Status: AC
  Administered 2021-04-15 – 2021-04-16 (×3): 1 g via INTRAVENOUS
  Filled 2021-04-15 (×3): qty 50

## 2021-04-15 MED ORDER — METHOCARBAMOL 500 MG PO TABS: 500.0000 mg | ORAL_TABLET | Freq: Four times a day (QID) | ORAL | Status: DC | PRN

## 2021-04-15 MED ORDER — OXYCODONE HCL 5 MG PO TABS
10.0000 mg | ORAL_TABLET | ORAL | Status: DC | PRN
  Administered 2021-04-16 (×2): 10 mg via ORAL
  Filled 2021-04-15: qty 2

## 2021-04-15 MED ORDER — PHENYLEPHRINE 40 MCG/ML (10ML) SYRINGE FOR IV PUSH (FOR BLOOD PRESSURE SUPPORT)
PREFILLED_SYRINGE | INTRAVENOUS | Status: DC | PRN
  Administered 2021-04-15 (×3): 40 ug via INTRAVENOUS

## 2021-04-15 MED ORDER — ACETAMINOPHEN 325 MG PO TABS: 325.0000 mg | ORAL_TABLET | Freq: Four times a day (QID) | ORAL | Status: DC | PRN

## 2021-04-15 SURGICAL SUPPLY — 46 items
BAG COUNTER SPONGE SURGICOUNT (BAG) ×2 IMPLANT
BANDAGE ESMARK 6X9 LF (GAUZE/BANDAGES/DRESSINGS) IMPLANT
BIT DRILL 4.5 TYB 9 (BIT) ×1 IMPLANT
BLADE SAW SGTL HD 18.5X60.5X1. (BLADE) ×2 IMPLANT
BLADE SURG 10 STRL SS (BLADE) IMPLANT
BNDG COHESIVE 4X5 TAN STRL (GAUZE/BANDAGES/DRESSINGS) ×2 IMPLANT
BNDG COHESIVE 6X5 TAN NS LF (GAUZE/BANDAGES/DRESSINGS) ×1 IMPLANT
BNDG ESMARK 6X9 LF (GAUZE/BANDAGES/DRESSINGS)
BNDG GAUZE ELAST 4 BULKY (GAUZE/BANDAGES/DRESSINGS) ×3 IMPLANT
COTTON STERILE ROLL (GAUZE/BANDAGES/DRESSINGS) ×2 IMPLANT
COVER MAYO STAND STRL (DRAPES) IMPLANT
COVER SURGICAL LIGHT HANDLE (MISCELLANEOUS) ×4 IMPLANT
DRAPE INCISE IOBAN 66X45 STRL (DRAPES) ×2 IMPLANT
DRAPE OEC MINIVIEW 54X84 (DRAPES) IMPLANT
DRAPE U-SHAPE 47X51 STRL (DRAPES) ×2 IMPLANT
DRSG ADAPTIC 3X8 NADH LF (GAUZE/BANDAGES/DRESSINGS) ×2 IMPLANT
DURAPREP 26ML APPLICATOR (WOUND CARE) ×2 IMPLANT
ELECT REM PT RETURN 9FT ADLT (ELECTROSURGICAL) ×2
ELECTRODE REM PT RTRN 9FT ADLT (ELECTROSURGICAL) ×1 IMPLANT
GAUZE SPONGE 4X4 12PLY STRL (GAUZE/BANDAGES/DRESSINGS) ×2 IMPLANT
GLOVE SURG ORTHO LTX SZ9 (GLOVE) ×2 IMPLANT
GLOVE SURG UNDER POLY LF SZ9 (GLOVE) ×2 IMPLANT
GOWN STRL REUS W/ TWL XL LVL3 (GOWN DISPOSABLE) ×3 IMPLANT
GOWN STRL REUS W/TWL XL LVL3 (GOWN DISPOSABLE) ×6
GUIDEWIRE TYB 2X9 (WIRE) ×2 IMPLANT
KIT BASIN OR (CUSTOM PROCEDURE TRAY) ×2 IMPLANT
KIT STAPLE ARCUS 20X17 STRL (Staple) ×1 IMPLANT
KIT TURNOVER KIT B (KITS) ×2 IMPLANT
MANIFOLD NEPTUNE II (INSTRUMENTS) ×2 IMPLANT
NS IRRIG 1000ML POUR BTL (IV SOLUTION) ×2 IMPLANT
PACK ORTHO EXTREMITY (CUSTOM PROCEDURE TRAY) ×2 IMPLANT
PAD ABD 8X10 STRL (GAUZE/BANDAGES/DRESSINGS) ×1 IMPLANT
PAD ARMBOARD 7.5X6 YLW CONV (MISCELLANEOUS) ×4 IMPLANT
PAD CAST 4YDX4 CTTN HI CHSV (CAST SUPPLIES) ×1 IMPLANT
PADDING CAST COTTON 4X4 STRL (CAST SUPPLIES) ×2
PUTTY DBM STAGRAFT PLUS 5CC (Putty) ×1 IMPLANT
SCREW CANN HDLS SHT 6.5X80 (Screw) ×2 IMPLANT
SCREW CANN HDLS SHT 6.5X85 (Screw) ×1 IMPLANT
SPONGE T-LAP 18X18 ~~LOC~~+RFID (SPONGE) ×2 IMPLANT
SUCTION FRAZIER HANDLE 10FR (MISCELLANEOUS) ×2
SUCTION TUBE FRAZIER 10FR DISP (MISCELLANEOUS) ×1 IMPLANT
SUT ETHILON 2 0 PSLX (SUTURE) ×6 IMPLANT
TOWEL GREEN STERILE (TOWEL DISPOSABLE) ×2 IMPLANT
TOWEL GREEN STERILE FF (TOWEL DISPOSABLE) ×2 IMPLANT
TUBE CONNECTING 12X1/4 (SUCTIONS) ×2 IMPLANT
WATER STERILE IRR 1000ML POUR (IV SOLUTION) ×2 IMPLANT

## 2021-04-15 NOTE — Plan of Care (Signed)

## 2021-04-15 NOTE — Transfer of Care (Signed)
Immediate Anesthesia Transfer of Care Note  Patient: EMORY GALLENTINE  Procedure(s) Performed: RIGHT SUBTALAR AND TALONAVICULAR FUSION (Right: Foot)  Patient Location: PACU  Anesthesia Type:MAC and Regional  Level of Consciousness: drowsy and patient cooperative  Airway & Oxygen Therapy: Patient Spontanous Breathing and Patient connected to face mask oxygen  Post-op Assessment: Report given to RN and Post -op Vital signs reviewed and stable  Post vital signs: Reviewed and stable  Last Vitals:  Vitals Value Taken Time  BP 103/66 04/15/21 1056  Temp    Pulse 58 04/15/21 1058  Resp 15 04/15/21 1058  SpO2 100 % 04/15/21 1058  Vitals shown include unvalidated device data.  Last Pain:  Vitals:   04/15/21 0835  TempSrc:   PainSc: 0-No pain         Complications: No notable events documented.

## 2021-04-15 NOTE — Anesthesia Preprocedure Evaluation (Signed)
Anesthesia Evaluation  Patient identified by MRN, date of birth, ID band Patient awake    Reviewed: Allergy & Precautions, NPO status , Patient's Chart, lab work & pertinent test results  Airway Mallampati: III  TM Distance: >3 FB Neck ROM: Full    Dental no notable dental hx.    Pulmonary former smoker,    Pulmonary exam normal breath sounds clear to auscultation       Cardiovascular Normal cardiovascular exam Rhythm:Regular Rate:Normal  ECG: SR, rate 82   Neuro/Psych PSYCHIATRIC DISORDERS Anxiety Depression negative neurological ROS     GI/Hepatic negative GI ROS, Neg liver ROS,   Endo/Other  diabetes, Oral Hypoglycemic Agents  Renal/GU negative Renal ROS     Musculoskeletal Multiple myeloma    Abdominal   Peds  Hematology negative hematology ROS (+)   Anesthesia Other Findings Posterior Tibial Tendon Insufficiency  Reproductive/Obstetrics                             Anesthesia Physical Anesthesia Plan  ASA: 2  Anesthesia Plan: Regional   Post-op Pain Management:    Induction: Intravenous  PONV Risk Score and Plan: 1 and Ondansetron, Dexamethasone, Propofol infusion and Treatment may vary due to age or medical condition  Airway Management Planned: Simple Face Mask  Additional Equipment:   Intra-op Plan:   Post-operative Plan:   Informed Consent: I have reviewed the patients History and Physical, chart, labs and discussed the procedure including the risks, benefits and alternatives for the proposed anesthesia with the patient or authorized representative who has indicated his/her understanding and acceptance.     Dental advisory given  Plan Discussed with: CRNA  Anesthesia Plan Comments:         Anesthesia Quick Evaluation

## 2021-04-15 NOTE — Op Note (Signed)
04/15/2021  11:10 AM  PATIENT:  William Barron    PRE-OPERATIVE DIAGNOSIS:  Posterior Tibial Tendon Insufficiency  POST-OPERATIVE DIAGNOSIS:  Same  PROCEDURE:  RIGHT SUBTALAR AND TALONAVICULAR FUSION C arm fluoroscopy to verify alignment.  SURGEON:  Newt Minion, MD  PHYSICIAN ASSISTANT:None ANESTHESIA:   General  PREOPERATIVE INDICATIONS:  William Barron is a  76 y.o. male with a diagnosis of Posterior Tibial Tendon Insufficiency who failed conservative measures and elected for surgical management.    The risks benefits and alternatives were discussed with the patient preoperatively including but not limited to the risks of infection, bleeding, nerve injury, cardiopulmonary complications, the need for revision surgery, among others, and the patient was willing to proceed.  OPERATIVE IMPLANTS: 2 headless 80 mm compression screws and a staple.  @ENCIMAGES @  OPERATIVE FINDINGS: C-arm fluoroscopy verified reduction and alignment with the internal fixation.  OPERATIVE PROCEDURE: Patient was brought the operating room after undergoing a regional anesthetic.  After adequate levels anesthesia were obtained patient's right lower extremity was prepped using DuraPrep draped into a sterile field a timeout was called.  An oblique incision was made over the sinus Tarsi the peroneal tendons were protected a REM b for millimeter bur was used to debride the subtalar joint.  This was further debrided with a curette.  This was packed with 5 cc of demineralized bone matrix.  Attention was then focused on the talonavicular joint.  A dorsal incision was made over the talonavicular joint the anterior tibial tendon was protected the neurovascular bundles were protected under direct visualization the rem B was used to debride the articular cartilage off the talonavicular joint.  A curette was used for further debridement.  This was also packed with demineralized bone matrix total of 5 cc.  The pronation  was corrected through the talonavicular joint and this was stabilized with a 20 mm staple.  A heel incision was then made and 2 guidewires were placed from the calcaneus into the talus C-arm fluoroscopy verified alignment and 280 mm screws were placed to stabilize the subtalar joint.  The incisions were closed using 2-0 nylon sterile dressing was applied patient was taken the PACU in stable condition.   DISCHARGE PLANNING:  Antibiotic duration: 24 hours IV antibiotics  Weightbearing: Touchdown weightbearing on the right  Pain medication: Opioid pathway  Dressing care/ Wound VAC: Change dressing as needed  Ambulatory devices: Walker  Discharge to: Patient wishes to discharge to [facility] skilled nursing  Follow-up: In the office 1 week post operative.

## 2021-04-15 NOTE — H&P (Signed)
William Barron is an 76 y.o. male.   Chief Complaint: Progressive pain and deformity with posterior tibial tendon insufficiency right foot. HPI: Patient is a 76 year old gentleman with chronic posterior tibial tendon insufficiency on the right.  Patient states that 2 weeks ago he stepped off a sidewalk and had acute pain at the insertion of the posterior tibial tendon.  Past Medical History:  Diagnosis Date   Amyloidosis ([place])    Diabetes mellitus without complication (HCC)    GERD (gastroesophageal reflux disease)    Hearing loss    Has hearing aids   History of kidney stones    Hyperlipidemia    Multiple myeloma (HCC)    and amylodosis    Nephrolithiasis    Occasional tremors    Pneumonia    Prostate cancer ([place])    Prostatitis    acute and chronic   Skin cancer     Past Surgical History:  Procedure Laterality Date   APPENDECTOMY     CARDIAC CATHETERIZATION N/A 06/13/2015   Procedure: Left Heart Cath and Coronary Angiography;  Surgeon: William Booze, MD;  Location: [facility];  Service: Cardiovascular;  Laterality: N/A;   COLONOSCOPY  04/07/00   ELBOW SURGERY  2003   right   EXTRACORPOREAL SHOCK WAVE LITHOTRIPSY Left 07/17/2018   Procedure: EXTRACORPOREAL SHOCK WAVE LITHOTRIPSY (ESWL);  Surgeon: William Seal, MD;  Location: [facility];  Service: Urology;  Laterality: Left;   EXTRACORPOREAL SHOCK WAVE LITHOTRIPSY Right 12/08/2020   Procedure: RIGHT EXTRACORPOREAL SHOCK WAVE LITHOTRIPSY (ESWL);  Surgeon: William Bring, MD;  Location: [facility];  Service: Urology;  Laterality: Right;   HERNIA REPAIR     ROTATOR CUFF REPAIR      Family History  Problem Relation Age of Onset   Pancreatic cancer Father    Prostate cancer Father    Parkinsonism Mother    CAD Brother 55   Cancer Paternal Grandmother    Breast cancer Neg Hx    Colon cancer Neg Hx    Social History:  reports that he quit smoking about 44 years ago. His smoking use included  cigarettes. He has a 60.00 pack-year smoking history. He has never used smokeless tobacco. He reports current alcohol use of about 3.0 standard drinks per week. He reports that he does not use drugs.  Allergies:  Allergies  Allergen Reactions   Penicillins Swelling and Rash    Patient states that he had a reaction 30 years ago and has taken penicillin since then    No medications prior to admission.    Results for orders placed or performed during the hospital encounter of 04/13/21 (from the past 48 hour(s))  SARS CORONAVIRUS 2 (TAT 6-24 HRS) Nasopharyngeal Nasopharyngeal Swab     Status: None   Collection Time: 04/13/21  2:13 PM   Specimen: Nasopharyngeal Swab  Result Value Ref Range   SARS Coronavirus 2 NEGATIVE NEGATIVE    Comment: (NOTE) SARS-CoV-2 target nucleic acids are NOT DETECTED.  The SARS-CoV-2 RNA is generally detectable in upper and lower respiratory specimens during the acute phase of infection. Negative results do not preclude SARS-CoV-2 infection, do not rule out co-infections with other pathogens, and should not be used as the sole basis for treatment or other patient management decisions. Negative results must be combined with clinical observations, patient history, and epidemiological information. The expected result is Negative.  Fact Sheet for Patients: SugarRoll.be  Fact Sheet for Healthcare Providers: https://www.woods-mathews.com/  This test is not yet  approved or cleared by the [place] and  has been authorized for detection and/or diagnosis of SARS-CoV-2 by FDA under an Emergency Use Authorization (EUA). This EUA will remain  in effect (meaning this test can be used) for the duration of the COVID-19 declaration under Se ction 564(b)(1) of the Act, 21 U.S.C. section 360bbb-3(b)(1), unless the authorization is terminated or revoked sooner.  Performed at [facility], [place] [address],  [place], [place] [address]    No results found.  Review of Systems  All other systems reviewed and are negative.  There were no vitals taken for this visit. Physical Exam  Patient is alert, oriented, no adenopathy, well-dressed, normal affect, normal respiratory effort. Examination patient has a good pulse.  He has a pronated valgus right foot with pes planus with chronic posterior tibial tendon insufficiency on the right he is tender to palpation of the insertion.  Pain with resisted inversion a positive too many toes sign.  Patient cannot do a single limb heel raise. Assessment/Plan 1. Posterior tibial tendinitis, right leg       Plan: Due to the persistent symptoms and insufficiency of the posterior tibial tendon and failure of conservative treatment patient would like to proceed with surgical intervention.  We will plan for talonavicular and subtalar fusion  William Minion, MD 04/15/2021, 6:58 AM

## 2021-04-15 NOTE — Anesthesia Procedure Notes (Signed)
Anesthesia Regional Block: Popliteal block   Pre-Anesthetic Checklist: , timeout performed,  Correct Patient, Correct Site, Correct Laterality,  Correct Procedure, Correct Position, site marked,  Risks and benefits discussed,  Surgical consent,  Pre-op evaluation,  At surgeon's request and post-op pain management  Laterality: Right  Prep: chloraprep       Needles:  Injection technique: Single-shot  Needle Type: Echogenic Stimulator Needle     Needle Length: 10cm  Needle Gauge: 20     Additional Needles:   Procedures:,,,, ultrasound used (permanent image in chart),,    Narrative:  Start time: 04/15/2021 9:20 AM End time: 04/15/2021 9:30 AM Injection made incrementally with aspirations every 5 mL.  Performed by: Personally  Anesthesiologist: Murvin Natal, MD  Additional Notes: Functioning IV was confirmed and monitors were applied.  A timeout was performed. Sterile prep, hand hygiene and sterile gloves were used. A 117mm 20ga BBraun echogenic stimulator needle was used. Negative aspiration and negative test dose prior to incremental administration of local anesthetic. The patient tolerated the procedure well.  Ultrasound guidance: relevent anatomy identified, needle position confirmed, local anesthetic spread visualized around nerve(s), vascular puncture avoided.  Image printed for medical record.

## 2021-04-15 NOTE — Progress Notes (Signed)
Patient's wife is requesting that the social worker call her in the morning to discuss his discharge planning to [facility]. She stated that they are holding a bed for him because the wife cannot take care of him while he is at home.  Wife #: [phone]

## 2021-04-15 NOTE — Anesthesia Procedure Notes (Signed)
Anesthesia Regional Block: Adductor canal block   Pre-Anesthetic Checklist: , timeout performed,  Correct Patient, Correct Site, Correct Laterality,  Correct Procedure,, site marked,  Risks and benefits discussed,  Surgical consent,  Pre-op evaluation,  At surgeon's request and post-op pain management  Laterality: Right  Prep: chloraprep       Needles:  Injection technique: Single-shot  Needle Type: Echogenic Stimulator Needle     Needle Length: 10cm  Needle Gauge: 20     Additional Needles:   Procedures:,,,, ultrasound used (permanent image in chart),,    Narrative:  Start time: 04/15/2021 8:25 AM End time: 04/15/2021 8:35 AM Injection made incrementally with aspirations every 5 mL.  Performed by: Personally  Anesthesiologist: Murvin Natal, MD  Additional Notes: Functioning IV was confirmed and monitors were applied. A time-out was performed. Hand hygiene and sterile gloves were used. The thigh was placed in a frog-leg position and prepped in a sterile fashion. A 165mm 20ga Bbraun echogenic stimulator needle was placed using ultrasound guidance.  Negative aspiration and negative test dose prior to incremental administration of local anesthetic. The patient tolerated the procedure well.

## 2021-04-15 NOTE — Interval H&P Note (Signed)
History and Physical Interval Note:  04/15/2021 9:41 AM  William Barron  has presented today for surgery, with the diagnosis of Posterior Tibial Tendon Insufficiency.  The various methods of treatment have been discussed with the patient and family. After consideration of risks, benefits and other options for treatment, the patient has consented to  Procedure(s): RIGHT SUBTALAR AND TALONAVICULAR FUSION (Right) as a surgical intervention.  The patient's history has been reviewed, patient examined, no change in status, stable for surgery.  I have reviewed the patient's chart and labs.  Questions were answered to the patient's satisfaction.     Newt Minion

## 2021-04-15 NOTE — Progress Notes (Signed)
Orthopedic Tech Progress Note Patient Details:  [name] 1945/11/07 [id number]  PACU RN called requesting a CAM WALKER for patient   Ortho Devices Type of Ortho Device: CAM walker Ortho Device/Splint Location: LLE Ortho Device/Splint Interventions: Ordered, Application, Adjustment   Post Interventions Patient Tolerated: Well Instructions Provided: Care of device  Janit Pagan 04/15/2021, 11:45 AM

## 2021-04-15 NOTE — Anesthesia Postprocedure Evaluation (Signed)
Anesthesia Post Note  Patient: AUGUSTE TEBBETTS  Procedure(s) Performed: RIGHT SUBTALAR AND TALONAVICULAR FUSION (Right: Foot)     Patient location during evaluation: PACU Anesthesia Type: Regional Level of consciousness: awake Pain management: pain level controlled Vital Signs Assessment: post-procedure vital signs reviewed and stable Respiratory status: spontaneous breathing, nonlabored ventilation, respiratory function stable and patient connected to nasal cannula oxygen Cardiovascular status: stable and blood pressure returned to baseline Postop Assessment: no apparent nausea or vomiting Anesthetic complications: no   No notable events documented.  Last Vitals:  Vitals:   04/15/21 1325 04/15/21 1358  BP: (!) 143/75 133/73  Pulse: 69 68  Resp: 20 18  Temp:  36.5 C  SpO2: 98% 99%    Last Pain:  Vitals:   04/15/21 1358  TempSrc: Oral  PainSc:                  Nichola Warren P Samul Mcinroy

## 2021-04-16 LAB — GLUCOSE, CAPILLARY
Glucose-Capillary: 175 mg/dL — ABNORMAL HIGH (ref 70–99)
Glucose-Capillary: 194 mg/dL — ABNORMAL HIGH (ref 70–99)
Glucose-Capillary: 188 mg/dL — ABNORMAL HIGH (ref 70–99)
Glucose-Capillary: 207 mg/dL — ABNORMAL HIGH (ref 70–99)

## 2021-04-16 NOTE — Progress Notes (Signed)
Patient ID: William Barron, male   DOB: 02/15/1946, 76 y.o.   MRN: [id number] Patient is postoperative day 1 right subtalar and talonavicular fusion.  Patient has no complaints from surgery.  Patient has had multiple falls at home does not feel safe for discharge to home.  Patient has contacted [facility] and my office has called William Barron burn for discharge to skilled nursing facility at [facility].  Plan for discharge to skilled nursing.

## 2021-04-16 NOTE — Evaluation (Signed)
Physical Therapy Evaluation Patient Details Name: William Barron MRN: [id number] DOB: [birth year] Today's Date: 04/16/2021  History of Present Illness  Pt is a 76 y.o. male admitted 04/15/21 with progressive R foot pain and deformity with posterior tibial tendon insufficiency. S/p R subtalar and talonavicular fusion on 2/1. PMH includes multiple myeloma, amyloidosis, tremors, DM, prostate CA, hearing loss.   Clinical Impression  Pt presents with an overall decrease in functional mobility secondary to above. PTA, pt ambulatory without DME, indep with ADLs; pt endorses multiple falls due to h/o "orthostatic tremors" ("which the doctor says there's no cure for"); lives with supportive wife. Educ on RLE precautions, positioning, activity recommendations and importance of mobility. Today, pt able to initiate transfer training with RW and modA; pt with significant essential tremors, difficulty progressing ambulation due to significant instability. Pt requesting SNF-level therapies to improve strength before return home; pt would certainly benefit from this to maximize functional mobility and independence. Will follow acutely to address established goals.     Recommendations for follow up therapy are one component of a multi-disciplinary discharge planning process, led by the attending physician.  Recommendations may be updated based on patient status, additional functional criteria and insurance authorization.  Follow Up Recommendations Skilled nursing-short term rehab (<3 hours/day)    Assistance Recommended at Discharge Intermittent Supervision/Assistance  Patient can return home with the following  A lot of help with walking and/or transfers;Assistance with cooking/housework;A lot of help with bathing/dressing/bathroom;Assist for transportation;Help with stairs or ramp for entrance    Equipment Recommendations  (defer to next venue)  Recommendations for Other Services       Functional Status  Assessment Patient has had a recent decline in their functional status and demonstrates the ability to make significant improvements in function in a reasonable and predictable amount of time.     Precautions / Restrictions Precautions Precautions: Fall;Other (comment) Precaution Comments: h/o "orthostatic tremors" which results in multiple falls; also with essential tremors limiting ability to use RW Required Braces or Orthoses: Other Brace Other Brace: R foot CAM boot Restrictions Weight Bearing Restrictions: Yes RLE Weight Bearing: Partial weight bearing RLE Partial Weight Bearing Percentage or Pounds: 50% PWB with cam boot donned      Mobility  Bed Mobility Overal bed mobility: Modified Independent             General bed mobility comments: HOB elevated    Transfers Overall transfer level: Needs assistance Equipment used: Rolling walker (2 wheels) Transfers: Sit to/from Stand, Bed to chair/wheelchair/BSC Sit to Stand: Mod assist   Step pivot transfers: Mod assist       General transfer comment: Pt tolerated multiple sit<>stand from EOB to RW, pt with significant instability requiring min-modA for trunk elevation, but mod-maxA to control RW and maintain balance with step pivot to recliner due to significant BUE tremors resulting in difficulty managing RW; pt requiring cues to increase WB through RLE as he was attempting to keep NWB in boot which proves difficult    Ambulation/Gait                  Stairs            Wheelchair Mobility    Modified Rankin (Stroke Patients Only)       Balance Overall balance assessment: Needs assistance Sitting-balance support: No upper extremity supported, Feet supported Sitting balance-Leahy Scale: Good     Standing balance support: Bilateral upper extremity supported, During functional activity, Reliant on assistive device for balance  Standing balance-Leahy Scale: Poor Standing balance comment: heavy reliance  on BUE support, external assist, assist to stabilize RW                             Pertinent Vitals/Pain Pain Assessment Pain Assessment: Faces Faces Pain Scale: Hurts little more Pain Location: RLE Pain Descriptors / Indicators: Discomfort, Grimacing, Guarding Pain Intervention(s): Monitored during session, Limited activity within patient's tolerance    Home Living Family/patient expects to be discharged to:: Private residence Living Arrangements: Spouse/significant other Available Help at Discharge: Family Type of Home: House Home Access: Stairs to enter Entrance Stairs-Rails: Right Entrance Stairs-Number of Steps: 4-5 Alternate Level Stairs-Number of Steps: Flight Home Layout: Two level Home Equipment: BSC/3in1;Shower seat      Prior Function Prior Level of Function : Independent/Modified Independent             Mobility Comments: Indep without DME. H/o multiple falls due to "orthostatic tremors." Enjoys reading and time with wife. Does not drive (wife drives) ADLs Comments: Independent. Wife performs 47 of household tasks     Hand Dominance        Extremity/Trunk Assessment   Upper Extremity Assessment Upper Extremity Assessment: Overall WFL for tasks assessed (strength and ROM WFL, but pt with "essential tremors" with mobility attempts; very shaky while holding onto walker)    Lower Extremity Assessment Lower Extremity Assessment: Generalized weakness;RLE deficits/detail RLE Deficits / Details: s/p R ankle fusion in cam walker boot; hip and knee functionally at least 3/5       Communication   Communication: HOH  Cognition Arousal/Alertness: Awake/alert Behavior During Therapy: WFL for tasks assessed/performed Overall Cognitive Status: Within Functional Limits for tasks assessed                                 General Comments: Some decreased attention and verbose requiring redirection in conversation        General  Comments General comments (skin integrity, edema, etc.): Pt states, "I don't know that I'll be able to use that walker with these tremors..." - when asked about other ways to mobilize, pt reports, "I guess I'll need a scooter" - educ on the fact pt can certainly work towards pivot transfers to/from a wheelchair, but ultimate goal is to ambulate again and we want to focus efforts here too; pt reports understanding    Exercises General Exercises - Lower Extremity Long Arc Quad: AROM, Right, Seated Hip Flexion/Marching: AROM, Right, Seated   Assessment/Plan    PT Assessment Patient needs continued PT services  PT Problem List Decreased strength;Decreased activity tolerance;Decreased balance;Decreased mobility;Decreased knowledge of use of DME;Decreased safety awareness;Decreased knowledge of precautions;Pain       PT Treatment Interventions DME instruction;Gait training;Stair training;Functional mobility training;Therapeutic activities;Therapeutic exercise;Balance training;Patient/family education;Wheelchair mobility training    PT Goals (Current goals can be found in the Care Plan section)  Acute Rehab PT Goals Patient Stated Goal: Post-acute rehab at SNF to regain strength before going home PT Goal Formulation: With patient Time For Goal Achievement: 04/30/21 Potential to Achieve Goals: Good    Frequency Min 3X/week     Co-evaluation               AM-PAC PT "6 Clicks" Mobility  Outcome Measure Help needed turning from your back to your side while in a flat bed without using bedrails?: A Little Help needed moving  from lying on your back to sitting on the side of a flat bed without using bedrails?: A Little Help needed moving to and from a bed to a chair (including a wheelchair)?: A Lot Help needed standing up from a chair using your arms (e.g., wheelchair or bedside chair)?: A Lot Help needed to walk in hospital room?: Total Help needed climbing 3-5 steps with a railing? :  Total 6 Click Score: 12    End of Session Equipment Utilized During Treatment: Gait belt Activity Tolerance: Patient tolerated treatment well Patient left: in chair;with call bell/phone within reach;with chair alarm set Nurse Communication: Mobility status PT Visit Diagnosis: Other abnormalities of gait and mobility (R26.89);Muscle weakness (generalized) (M62.81);Pain Pain - Right/Left: Right Pain - part of body: Leg;Ankle and joints of foot    Time: 1137-1206 PT Time Calculation (min) (ACUTE ONLY): 29 min   Charges:   PT Evaluation $PT Eval Moderate Complexity: 1 Mod PT Treatments $Therapeutic Activity: 8-22 mins      Mabeline Caras, PT, DPT Acute Rehabilitation Services  Pager [phone] Office [place] 04/16/2021, 2:25 PM

## 2021-04-16 NOTE — TOC Initial Note (Signed)
Transition of Care [facility]) - Initial/Assessment Note    Patient Details  Name: William Barron MRN: [id number] Date of Birth: Feb 12, 1946  Transition of Care [facility]) CM/SW Contact:    Joanne Chars, LCSW Phone Number: 04/16/2021, 1:55 PM  Clinical Narrative: Pt is pending PT eval but CSW notes pt has been referred to [facility] for SNF rehab.  CSW spoke with pt who confirms that he would like to pursue SNF at [facility].  Permission given to send referral in hub.  Permission given to speak with wife [name], who is not currently available.  Pt LM with wife and she should be back later this afternoon.  Pt is vaccinated for covid with one booster.                     Expected Discharge Plan: Skilled Nursing Facility Barriers to Discharge: Continued Medical Work up, SNF Pending bed offer   Patient Goals and CMS Choice Patient states their goals for this hospitalization and ongoing recovery are:: be able to walk without pain CMS Medicare.gov Compare Post Acute Care list provided to::  (pt has prearranged bed at [facility])    Expected Discharge Plan and Services Expected Discharge Plan: [place] In-house Referral: Clinical Social Work   Post Acute Care Choice: [place] Living arrangements for the past 2 months: [place]                                      Prior Living Arrangements/Services Living arrangements for the past 2 months: Single Family Home Lives with:: Spouse Patient language and need for interpreter reviewed:: Yes Do you feel safe going back to the place where you live?: Yes      Need for Family Participation in Patient Care: No (Comment) Care giver support system in place?: Yes (comment) Current home services: Other (comment) (none) Criminal Activity/Legal Involvement Pertinent to Current Situation/Hospitalization: No - Comment as needed  Activities of Daily Living Home Assistive Devices/Equipment: Hearing aid,  Eyeglasses ADL Screening (condition at time of admission) Patient's cognitive ability adequate to safely complete daily activities?: Yes Is the patient deaf or have difficulty hearing?: Yes Does the patient have difficulty seeing, even when wearing glasses/contacts?: Yes Does the patient have difficulty concentrating, remembering, or making decisions?: Yes Patient able to express need for assistance with ADLs?: Yes Does the patient have difficulty dressing or bathing?: No Independently performs ADLs?: Yes (appropriate for developmental age) Does the patient have difficulty walking or climbing stairs?: Yes Weakness of Legs: Both Weakness of Arms/Hands: None  Permission Sought/Granted Permission sought to share information with : Family Supports Permission granted to share information with : Yes, Verbal Permission Granted  Share Information with NAME: wife [name]  Permission granted to share info w AGENCY: [name]        Emotional Assessment Appearance:: Appears stated age Attitude/Demeanor/Rapport: Engaged Affect (typically observed): Appropriate, Pleasant Orientation: : Oriented to Self, Oriented to Place, Oriented to  Time, Oriented to Situation Alcohol / Substance Use: Not Applicable Psych Involvement: No (comment)  Admission diagnosis:  Posterior tibial tendon dysfunction, right [X50.569] Patient Active Problem List   Diagnosis Date Noted   Posterior tibial tendon dysfunction, right 04/15/2021   Posterior tibial tendinitis, right leg    Malignant neoplasm of prostate ([place]) 02/11/2020   Orthostatic tremor 10/01/2019   Educated about COVID-19 virus infection 09/12/2019   Dyslipidemia 09/12/2019  Nonrheumatic pulmonary valve insufficiency 09/12/2019   Red blood cell antibody positive, compatible PRBC difficult to obtain 02/15/2018   Diplopia 01/03/2018   Benign essential tremor 01/03/2018   Pain in joint of right ankle 05/17/2017   Foot pain 05/17/2017   Peroneal  tendinitis 05/17/2017   Depression with anxiety 01/03/2017   Insomnia 05/05/2016   Coronary artery calcification seen on CAT scan    DOE (dyspnea on exertion)    Fatigue 02/27/2015   Elevated homocysteine 09/26/2014   Multiple myeloma ([place]) 09/26/2014   Acquired pes planus of right foot 07/02/2014   Posterior tibial tendon dysfunction 07/02/2014   Encounter for antineoplastic chemotherapy 01/02/2014   Kahler disease ([place]) 01/02/2014   Avitaminosis D 01/02/2014   Amyloidosis ([place]) 03/26/2013   Enlarged lymph node 02/14/2013   Pure hypercholesterolemia 03/29/2011   Memory disorder 09/21/2010   Labile hypertension 09/21/2010   PCP:  [name], MD Pharmacy:   [id number] [place] [place]Phone: [phone]: [phone] [place]: [phone]: [phone] [place]Phone: [phone]: [address] [place]Phone: [phone]: [phone]    Social Determinants of Health (SDOH) Interventions    Readmission Risk Interventions No flowsheet data found.

## 2021-04-16 NOTE — NC FL2 (Signed)
[place] LEVEL OF CARE SCREENING TOOL     IDENTIFICATION  Patient Name: William Barron Birthdate: Mar 18, 1945 Sex: male Admission Date (Current Location): 04/15/2021  [facility] and [place] Number:  Herbalist and Address:  [facility], [place] [address], [place], [place] [address]      Provider Number: [id number]  Attending Physician Name and Address:  Newt Minion, MD  Relative Name and Phone Number:  [place], [place] [phone]    Current Level of Care: Hospital Recommended Level of Care: [place] Prior Approval Number:    Date Approved/Denied:   PASRR Number: [id number]  Discharge Plan: SNF    Current Diagnoses: Patient Active Problem List   Diagnosis Date Noted   Posterior tibial tendon dysfunction, right 04/15/2021   Posterior tibial tendinitis, right leg    Malignant neoplasm of prostate ([place]) 02/11/2020   Orthostatic tremor 10/01/2019   Educated about COVID-19 virus infection 09/12/2019   Dyslipidemia 09/12/2019   Nonrheumatic pulmonary valve insufficiency 09/12/2019   Red blood cell antibody positive, compatible PRBC difficult to obtain 02/15/2018   Diplopia 01/03/2018   Benign essential tremor 01/03/2018   Pain in joint of right ankle 05/17/2017   Foot pain 05/17/2017   Peroneal tendinitis 05/17/2017   Depression with anxiety 01/03/2017   Insomnia 05/05/2016   Coronary artery calcification seen on CAT scan    DOE (dyspnea on exertion)    Fatigue 02/27/2015   Elevated homocysteine 09/26/2014   Multiple myeloma ([place]) 09/26/2014   Acquired pes planus of right foot 07/02/2014   Posterior tibial tendon dysfunction 07/02/2014   Encounter for antineoplastic chemotherapy 01/02/2014   Kahler disease ([place]) 01/02/2014   Avitaminosis D 01/02/2014   Amyloidosis ([place]) 03/26/2013   Enlarged lymph node 02/14/2013   Pure hypercholesterolemia 03/29/2011   Memory disorder 09/21/2010   Labile  hypertension 09/21/2010    Orientation RESPIRATION BLADDER Height & Weight     Self, Time, Situation, Place  Normal Continent Weight: 140 lb (63.5 kg) Height:  '5\' 6"'  (167.6 cm)  BEHAVIORAL SYMPTOMS/MOOD NEUROLOGICAL BOWEL NUTRITION STATUS      Continent Diet (see discharge summary)  AMBULATORY STATUS COMMUNICATION OF NEEDS Skin   Total Care Verbally Surgical wounds                       Personal Care Assistance Level of Assistance  Bathing, Feeding, Dressing Bathing Assistance: Limited assistance Feeding assistance: Independent Dressing Assistance: Limited assistance     Functional Limitations Info  Sight, Hearing, Speech Sight Info: Adequate Hearing Info: Adequate Speech Info: Adequate    SPECIAL CARE FACTORS FREQUENCY  PT (By licensed PT), OT (By licensed OT)     PT Frequency: 5x week OT Frequency: 5x week            Contractures Contractures Info: Not present    Additional Factors Info  Code Status, Allergies Code Status Info: full Allergies Info: penicillins           Current Medications (04/16/2021):  This is the current hospital active medication list Current Facility-Administered Medications  Medication Dose Route Frequency Provider Last Rate Last Admin   0.9 %  sodium chloride infusion   Intravenous Continuous Newt Minion, MD 10 mL/hr at 04/15/21 1356 New Bag at 04/15/21 1356   acetaminophen (TYLENOL) tablet 325-650 mg  325-650 mg Oral Q6H PRN Newt Minion, MD       bisacodyl (DULCOLAX) suppository 10 mg  10  mg Rectal Daily PRN Newt Minion, MD       docusate sodium (COLACE) capsule 100 mg  100 mg Oral BID Newt Minion, MD   100 mg at 04/16/21 0801   HYDROmorphone (DILAUDID) injection 0.5-1 mg  0.5-1 mg Intravenous Q4H PRN Newt Minion, MD       insulin aspart (novoLOG) injection 0-15 Units  0-15 Units Subcutaneous TID WC Newt Minion, MD       metFORMIN (GLUCOPHAGE-XR) 24 hr tablet 500 mg  500 mg Oral BID WC Newt Minion, MD   500  mg at 04/16/21 0801   methocarbamol (ROBAXIN) tablet 500 mg  500 mg Oral Q6H PRN Newt Minion, MD       Or   methocarbamol (ROBAXIN) 500 mg in dextrose 5 % 50 mL IVPB  500 mg Intravenous Q6H PRN Newt Minion, MD       metoCLOPramide (REGLAN) tablet 5-10 mg  5-10 mg Oral Q8H PRN Newt Minion, MD       Or   metoCLOPramide (REGLAN) injection 5-10 mg  5-10 mg Intravenous Q8H PRN Newt Minion, MD       ondansetron [facility]) tablet 4 mg  4 mg Oral Q6H PRN Newt Minion, MD       Or   ondansetron [facility]) injection 4 mg  4 mg Intravenous Q6H PRN Newt Minion, MD       oxyCODONE (Oxy IR/ROXICODONE) immediate release tablet 10-15 mg  10-15 mg Oral Q4H PRN Newt Minion, MD   10 mg at 04/16/21 2182   oxyCODONE (Oxy IR/ROXICODONE) immediate release tablet 5-10 mg  5-10 mg Oral Q4H PRN Newt Minion, MD   10 mg at 04/15/21 2327   polyethylene glycol (MIRALAX / GLYCOLAX) packet 17 g  17 g Oral Daily PRN Newt Minion, MD       traZODone (DESYREL) tablet 50 mg  50 mg Oral QHS PRN Newt Minion, MD         Discharge Medications: Please see discharge summary for a list of discharge medications.  Relevant Imaging Results:  Relevant Lab Results:   Additional Information SSN: [id number].  Pt is vaccinated for covid with one booster.  Joanne Chars, LCSW

## 2021-04-16 NOTE — Plan of Care (Signed)

## 2021-04-17 ENCOUNTER — Encounter (HOSPITAL_COMMUNITY): Payer: Self-pay | Admitting: Orthopedic Surgery

## 2021-04-17 LAB — GLUCOSE, CAPILLARY: Glucose-Capillary: 155 mg/dL — ABNORMAL HIGH (ref 70–99)

## 2021-04-17 MED ORDER — HYDROCODONE-ACETAMINOPHEN 5-325 MG PO TABS: 1.0000 | ORAL_TABLET | ORAL | 0 refills | Status: DC | PRN

## 2021-04-17 NOTE — TOC Progression Note (Signed)
Transition of Care [facility]) - Progression Note    Patient Details  Name: William Barron MRN: [id number] Date of Birth: [birth year]  [facility]) CM/SW Contact  Joanne Chars, LCSW Phone Number: 04/17/2021, 8:51 AM  Clinical Narrative:   CSW confirmed with [name] that they can accept pt today.  They do not need new covid test.     Expected Discharge Plan: [place] Barriers to Discharge: Continued Medical Work up, SNF Pending bed offer  Expected Discharge Plan and Services Expected Discharge Plan: [place] In-house Referral: Clinical Social Work   Post Acute Care Choice: [place] arrangements for the past 2 months: Single Family Home Expected Discharge Date: 04/17/21                                     Social Determinants of Health (SDOH) Interventions    Readmission Risk Interventions No flowsheet data found.

## 2021-04-17 NOTE — Discharge Summary (Signed)
Discharge Diagnoses:  Principal Problem:   Posterior tibial tendon dysfunction, right Active Problems:   Posterior tibial tendinitis, right leg   Surgeries: Procedure(s): RIGHT SUBTALAR AND TALONAVICULAR FUSION on 04/15/2021    Consultants:   Discharged Condition: Improved  Hospital Course: William Barron is an 76 y.o. male who was admitted 04/15/2021 with a chief complaint of posterior tibial tendon insufficiency, with a final diagnosis of Posterior Tibial Tendon Insufficiency.  Patient was brought to the operating room on 04/15/2021 and underwent Procedure(s): RIGHT SUBTALAR AND TALONAVICULAR FUSION.    Patient was given perioperative antibiotics:  Anti-infectives (From admission, onward)    Start     Dose/Rate Route Frequency Ordered Stop   04/15/21 1600  ceFAZolin (ANCEF) IVPB 1 g/50 mL premix        1 g 100 mL/hr over 30 Minutes Intravenous Every 6 hours 04/15/21 1344 04/16/21 0529   04/15/21 0830  clindamycin (CLEOCIN) IVPB 900 mg  Status:  Discontinued        900 mg 100 mL/hr over 30 Minutes Intravenous On call to O.R. 04/15/21 0815 04/15/21 1343     .  Patient was given sequential compression devices, early ambulation, and aspirin for DVT prophylaxis.  Recent vital signs: Patient Vitals for the past 24 hrs:  BP Temp Temp src Pulse Resp SpO2  04/17/21 0516 123/60 -- -- 77 17 94 %  04/16/21 2123 133/69 98.2 F (36.8 C) -- 93 16 97 %  04/16/21 1241 111/76 98.2 F (36.8 C) Oral 87 17 98 %  .  Recent laboratory studies: No results found.  Discharge Medications:   Allergies as of 04/17/2021       Reactions   Penicillins Swelling, Rash   Pt received ancef on 04-15-2021 without issue        Medication List     STOP taking these medications    amoxicillin-clavulanate 875-125 MG tablet Commonly known as: AUGMENTIN   Ninlaro 3 MG capsule Generic drug: ixazomib citrate   ondansetron 4 MG disintegrating tablet Commonly known as: Zofran ODT   tamsulosin 0.4  MG Caps capsule Commonly known as: Flomax       TAKE these medications    HYDROcodone-acetaminophen 5-325 MG tablet Commonly known as: NORCO/VICODIN Take 1 tablet by mouth every 4 (four) hours as needed. What changed:  how much to take Another medication with the same name was removed. Continue taking this medication, and follow the directions you see here.   metFORMIN 500 MG 24 hr tablet Commonly known as: GLUCOPHAGE-XR Take 500 mg by mouth 2 (two) times daily.   Testosterone 20.25 MG/ACT (1.62%) Gel APPLY 4 PUMPS TO SKIN IN THE MORNING (APPLY AFTER SHOWER OR BATH TO CLEAN, DRY, INTACT SKIN ON UPPER ARM OR SHOULDER AREAS ONLY)   traZODone 50 MG tablet Commonly known as: DESYREL Take 50 mg by mouth at bedtime as needed for sleep.               Discharge Care Instructions  (From admission, onward)           Start     Ordered   04/17/21 0000  Touch down weight bearing       Question Answer Comment  Laterality right   Extremity Lower      04/17/21 0727            Diagnostic Studies: No results found.  Patient benefited maximally from their hospital stay and there were no complications.     Disposition: Discharge disposition:  03-Skilled Nursing Facility      Discharge Instructions     Call MD / Call 911   Complete by: As directed    If you experience chest pain or shortness of breath, CALL 911 and be transported to the hospital emergency room.  If you develope a fever above 101 F, pus (white drainage) or increased drainage or redness at the wound, or calf pain, call your surgeon's office.   Constipation Prevention   Complete by: As directed    Drink plenty of fluids.  Prune juice may be helpful.  You may use a stool softener, such as Colace (over the counter) 100 mg twice a day.  Use MiraLax (over the counter) for constipation as needed.   Diet - low sodium heart healthy   Complete by: As directed    Increase activity slowly as tolerated    Complete by: As directed    Post-operative opioid taper instructions:   Complete by: As directed    POST-OPERATIVE OPIOID TAPER INSTRUCTIONS: It is important to wean off of your opioid medication as soon as possible. If you do not need pain medication after your surgery it is ok to stop day one. Opioids include: Codeine, Hydrocodone(Norco, Vicodin), Oxycodone(Percocet, oxycontin) and hydromorphone amongst others.  Long term and even short term use of opiods can cause: Increased pain response Dependence Constipation Depression Respiratory depression And more.  Withdrawal symptoms can include Flu like symptoms Nausea, vomiting And more Techniques to manage these symptoms Hydrate well Eat regular healthy meals Stay active Use relaxation techniques(deep breathing, meditating, yoga) Do Not substitute Alcohol to help with tapering If you have been on opioids for less than two weeks and do not have pain than it is ok to stop all together.  Plan to wean off of opioids This plan should start within one week post op of your joint replacement. Maintain the same interval or time between taking each dose and first decrease the dose.  Cut the total daily intake of opioids by one tablet each day Next start to increase the time between doses. The last dose that should be eliminated is the evening dose.      Touch down weight bearing   Complete by: As directed    Laterality: right   Extremity: Lower       Follow-up Information     Newt Minion, MD Follow up in 1 week(s).   Specialty: Orthopedic Surgery Contact information: [address] [place] [address] [phone]                  Signed: Newt Minion 04/17/2021, 7:27 AM

## 2021-04-17 NOTE — TOC Transition Note (Signed)
Transition of Care [facility]) - CM/SW Discharge Note   Patient Details  Name: William Barron MRN: [id number] Date of Birth: 13-Sep-1945  Transition of Care [facility]) CM/SW Contact:  Joanne Chars, LCSW Phone Number: 04/17/2021, 9:27 AM   Clinical Narrative:   Pt discharging to [facility], room 109.  RN call [phone] for report.     Final next level of care: Skilled Nursing Facility Barriers to Discharge: Barriers Resolved   Patient Goals and CMS Choice Patient states their goals for this hospitalization and ongoing recovery are:: be able to walk without pain CMS Medicare.gov Compare Post Acute Care list provided to::  (pt has prearranged bed at [facility])    Discharge Placement              Patient chooses bed at:  [facility]) Patient to be transferred to facility by: [facility] Name of family member notified: wife William Barron Patient and family notified of of transfer: 04/17/21  Discharge Plan and Services In-house Referral: Clinical Social Work   Post Acute Care Choice: [facility]                               Social Determinants of Health (SDOH) Interventions     Readmission Risk Interventions No flowsheet data found.

## 2021-04-17 NOTE — Plan of Care (Signed)

## 2021-04-22 ENCOUNTER — Ambulatory Visit (INDEPENDENT_AMBULATORY_CARE_PROVIDER_SITE_OTHER): Payer: Medicare Other | Admitting: Family

## 2021-04-22 ENCOUNTER — Encounter: Payer: Self-pay | Admitting: Family

## 2021-04-22 ENCOUNTER — Other Ambulatory Visit: Payer: Self-pay

## 2021-04-22 DIAGNOSIS — M76821 Posterior tibial tendinitis, right leg: Secondary | ICD-10-CM

## 2021-04-22 NOTE — Progress Notes (Signed)
Post-Op Visit Note   Patient: William Barron           Date of Birth: 1945/05/24           MRN: [id number] Visit Date: 04/22/2021 PCP: [name], MD  Chief Complaint:  Chief Complaint  Patient presents with   Right Foot - Routine Post Op    04/15/21 right subtalar and talonavicular fusion    HPI:  HPI The patient is a 76 year old gentleman seen status post right subtalar and talonavicular fusion on February 1 he has been residing at [facility]  Using a cam walker.  Nonweightbearing.  Ortho Exam His incisions are well approximated sutures these are clean dry and intact there is no erythema mild edema  Visit Diagnoses: No diagnosis found.  Plan: Begin daily Dial soap cleansing.  Dry dressing changes.  Elevate for swelling continue nonweightbearing radiographs of the foot and follow-up  Follow-Up Instructions: Return in about 1 week (around 04/29/2021).   Imaging: No results found.  Orders:  No orders of the defined types were placed in this encounter.  No orders of the defined types were placed in this encounter.    PMFS History: Patient Active Problem List   Diagnosis Date Noted   Posterior tibial tendon dysfunction, right 04/15/2021   Posterior tibial tendinitis, right leg    Malignant neoplasm of prostate ([place]) 02/11/2020   Orthostatic tremor 10/01/2019   Educated about COVID-19 virus infection 09/12/2019   Dyslipidemia 09/12/2019   Nonrheumatic pulmonary valve insufficiency 09/12/2019   Red blood cell antibody positive, compatible PRBC difficult to obtain 02/15/2018   Diplopia 01/03/2018   Benign essential tremor 01/03/2018   Pain in joint of right ankle 05/17/2017   Foot pain 05/17/2017   Peroneal tendinitis 05/17/2017   Depression with anxiety 01/03/2017   Insomnia 05/05/2016   Coronary artery calcification seen on CAT scan    DOE (dyspnea on exertion)    Fatigue 02/27/2015   Elevated homocysteine 09/26/2014   Multiple myeloma ([place]) 09/26/2014    Acquired pes planus of right foot 07/02/2014   Posterior tibial tendon dysfunction 07/02/2014   Encounter for antineoplastic chemotherapy 01/02/2014   Kahler disease ([place]) 01/02/2014   Avitaminosis D 01/02/2014   Amyloidosis ([place]) 03/26/2013   Enlarged lymph node 02/14/2013   Pure hypercholesterolemia 03/29/2011   Memory disorder 09/21/2010   Labile hypertension 09/21/2010   Past Medical History:  Diagnosis Date   Amyloidosis ([place])    Diabetes mellitus without complication ([place])    GERD (gastroesophageal reflux disease)    Hearing loss    Has hearing aids   History of kidney stones    Hyperlipidemia    Multiple myeloma ([facility])    and amylodosis    Nephrolithiasis    Occasional tremors    Pneumonia    Prostate cancer ([place])    Prostatitis    acute and chronic   Skin cancer     Family History  Problem Relation Age of Onset   Pancreatic cancer Father    Prostate cancer Father    Parkinsonism Mother    CAD Brother 10   Cancer Paternal Grandmother    Breast cancer Neg Hx    Colon cancer Neg Hx     Past Surgical History:  Procedure Laterality Date   APPENDECTOMY     CARDIAC CATHETERIZATION N/A 06/13/2015   Procedure: Left Heart Cath and Coronary Angiography;  Surgeon: Jettie Booze, MD;  Location: [facility];  Service: Cardiovascular;  Laterality: N/A;  COLONOSCOPY  04/07/00   ELBOW SURGERY  2003   right   EXTRACORPOREAL SHOCK WAVE LITHOTRIPSY Left 07/17/2018   Procedure: EXTRACORPOREAL SHOCK WAVE LITHOTRIPSY (ESWL);  Surgeon: [name], MD;  Location: [facility];  Service: Urology;  Laterality: Left;   EXTRACORPOREAL SHOCK WAVE LITHOTRIPSY Right 12/08/2020   Procedure: RIGHT EXTRACORPOREAL SHOCK WAVE LITHOTRIPSY (ESWL);  Surgeon: Raynelle Bring, MD;  Location: [facility];  Service: Urology;  Laterality: Right;   FOOT ARTHRODESIS Right 04/15/2021   Procedure: RIGHT SUBTALAR AND TALONAVICULAR FUSION;  Surgeon: Newt Minion, MD;  Location: [facility];   Service: Orthopedics;  Laterality: Right;   HERNIA REPAIR     ROTATOR CUFF REPAIR     Social History   Occupational History   Occupation: retired  Tobacco Use   Smoking status: Former    Packs/day: 3.00    Years: 20.00    Pack years: 60.00    Types: Cigarettes    Quit date: 03/15/1977    Years since quitting: 44.1   Smokeless tobacco: Never   Tobacco comments:    quit 35 years ago  Vaping Use   Vaping Use: Never used  Substance and Sexual Activity   Alcohol use: Yes    Alcohol/week: 3.0 standard drinks    Types: 3 Glasses of wine per week    Comment: approx 1 drink/night   Drug use: No   Sexual activity: Not Currently    Birth control/protection: None

## 2021-04-30 ENCOUNTER — Ambulatory Visit (INDEPENDENT_AMBULATORY_CARE_PROVIDER_SITE_OTHER): Payer: Medicare Other | Admitting: Orthopedic Surgery

## 2021-04-30 ENCOUNTER — Ambulatory Visit (INDEPENDENT_AMBULATORY_CARE_PROVIDER_SITE_OTHER): Payer: Medicare Other

## 2021-04-30 ENCOUNTER — Other Ambulatory Visit: Payer: Self-pay

## 2021-04-30 DIAGNOSIS — M25571 Pain in right ankle and joints of right foot: Secondary | ICD-10-CM

## 2021-05-01 ENCOUNTER — Telehealth: Payer: Self-pay | Admitting: Orthopedic Surgery

## 2021-05-01 NOTE — Telephone Encounter (Signed)
William Barron from [facility] called. She would like to know if he can take the boot off to shower? Her call back number is [phone]

## 2021-05-01 NOTE — Telephone Encounter (Signed)
Called and lm on vm to advise that this would be ok.

## 2021-05-04 ENCOUNTER — Encounter: Payer: Self-pay | Admitting: Orthopedic Surgery

## 2021-05-04 NOTE — Progress Notes (Signed)
Office Visit Note   Patient: William Barron           Date of Birth: Jul 06, 1945           MRN: [id number] Visit Date: 04/30/2021              Requested by: [name], MD [address] [place],  [place] [address] PCP: [name], MD  Chief Complaint  Patient presents with   Right Foot - Routine Post Op    04/15/2021 right subtalar and talonavicular fusion       HPI: Patient is a 76 year old gentleman who is 2 weeks status post right subtalar and talonavicular fusion.  Patient currently in a cam walker nonweightbearing.  Assessment & Plan: Visit Diagnoses:  1. Pain in right ankle and joints of right foot     Plan: Sutures are harvested continue nonweightbearing obtain three-view radiographs of the right foot at follow-up.  Follow-Up Instructions: Return in about 4 weeks (around 05/28/2021).   Ortho Exam  Patient is alert, oriented, no adenopathy, well-dressed, normal affect, normal respiratory effort. Examination the incision is well-healed there is no redness no cellulitis no drainage.  There is minimal swelling.  Imaging: No results found. No images are attached to the encounter.  Labs: Lab Results  Component Value Date   HGBA1C 7.8 (H) 04/10/2021   REPTSTATUS 05/04/2015 FINAL 05/01/2015   CULT  05/01/2015    >=100,000 COLONIES/mL ESCHERICHIA COLI Performed at [facility] 05/01/2015     Lab Results  Component Value Date   ALBUMIN 4.0 02/08/2021   ALBUMIN 3.8 12/04/2020   ALBUMIN 3.9 06/21/2018    No results found for: MG Lab Results  Component Value Date   VD25OH 24.1 (L) 09/26/2014    No results found for: PREALBUMIN CBC EXTENDED Latest Ref Rng & Units 02/08/2021 12/04/2020 05/06/2020  WBC 4.0 - 10.5 K/uL 10.1 7.7 4.0  RBC 4.22 - 5.81 MIL/uL 4.97 4.53 4.17(L)  HGB 13.0 - 17.0 g/dL 15.1 13.8 12.8(L)  HCT 39.0 - 52.0 % 46.6 43.9 39.9  PLT 150 - 400 K/uL 307 300 333  NEUTROABS 1.7 - 7.7 K/uL 8.5(H)  6.5 2.3  LYMPHSABS 0.7 - 4.0 K/uL 0.5(L) 0.5(L) 0.8     There is no height or weight on file to calculate BMI.  Orders:  Orders Placed This Encounter  Procedures   XR Foot Complete Right   No orders of the defined types were placed in this encounter.    Procedures: No procedures performed  Clinical Data: No additional findings.  ROS:  All other systems negative, except as noted in the HPI. Review of Systems  Objective: Vital Signs: There were no vitals taken for this visit.  Specialty Comments:  No specialty comments available.  PMFS History: Patient Active Problem List   Diagnosis Date Noted   Posterior tibial tendon dysfunction, right 04/15/2021   Posterior tibial tendinitis, right leg    Malignant neoplasm of prostate ([place]) 02/11/2020   Orthostatic tremor 10/01/2019   Educated about COVID-19 virus infection 09/12/2019   Dyslipidemia 09/12/2019   Nonrheumatic pulmonary valve insufficiency 09/12/2019   Red blood cell antibody positive, compatible PRBC difficult to obtain 02/15/2018   Diplopia 01/03/2018   Benign essential tremor 01/03/2018   Pain in joint of right ankle 05/17/2017   Foot pain 05/17/2017   Peroneal tendinitis 05/17/2017   Depression with anxiety 01/03/2017   Insomnia 05/05/2016   Coronary artery calcification seen on CAT  scan    DOE (dyspnea on exertion)    Fatigue 02/27/2015   Elevated homocysteine 09/26/2014   Multiple myeloma ([place]) 09/26/2014   Acquired pes planus of right foot 07/02/2014   Posterior tibial tendon dysfunction 07/02/2014   Encounter for antineoplastic chemotherapy 01/02/2014   Kahler disease ([place]) 01/02/2014   Avitaminosis D 01/02/2014   Amyloidosis ([place]) 03/26/2013   Enlarged lymph node 02/14/2013   Pure hypercholesterolemia 03/29/2011   Memory disorder 09/21/2010   Labile hypertension 09/21/2010   Past Medical History:  Diagnosis Date   Amyloidosis ([place])    Diabetes mellitus without complication ([facility])    GERD  (gastroesophageal reflux disease)    Hearing loss    Has hearing aids   History of kidney stones    Hyperlipidemia    Multiple myeloma ([facility])    and amylodosis    Nephrolithiasis    Occasional tremors    Pneumonia    Prostate cancer ([place])    Prostatitis    acute and chronic   Skin cancer     Family History  Problem Relation Age of Onset   Pancreatic cancer Father    Prostate cancer Father    Parkinsonism Mother    CAD Brother 46   Cancer Paternal Grandmother    Breast cancer Neg Hx    Colon cancer Neg Hx     Past Surgical History:  Procedure Laterality Date   APPENDECTOMY     CARDIAC CATHETERIZATION N/A 06/13/2015   Procedure: Left Heart Cath and Coronary Angiography;  Surgeon: Jettie Booze, MD;  Location: [facility];  Service: Cardiovascular;  Laterality: N/A;   COLONOSCOPY  04/07/00   ELBOW SURGERY  2003   right   EXTRACORPOREAL SHOCK WAVE LITHOTRIPSY Left 07/17/2018   Procedure: EXTRACORPOREAL SHOCK WAVE LITHOTRIPSY (ESWL);  Surgeon: [name], MD;  [facility];  Service: Urology;  Laterality: Left;   EXTRACORPOREAL SHOCK WAVE LITHOTRIPSY Right 12/08/2020   Procedure: RIGHT EXTRACORPOREAL SHOCK WAVE LITHOTRIPSY (ESWL);  Surgeon: Raynelle Bring, MD;  Location: [facility];  Service: Urology;  Laterality: Right;   FOOT ARTHRODESIS Right 04/15/2021   Procedure: RIGHT SUBTALAR AND TALONAVICULAR FUSION;  Surgeon: Newt Minion, MD;  Location: [facility];  Service: Orthopedics;  Laterality: Right;   HERNIA REPAIR     ROTATOR CUFF REPAIR     Social History   Occupational History   Occupation: retired  Tobacco Use   Smoking status: Former    Packs/day: 3.00    Years: 20.00    Pack years: 60.00    Types: Cigarettes    Quit date: 03/15/1977    Years since quitting: 44.1   Smokeless tobacco: Never   Tobacco comments:    quit 35 years ago  Vaping Use   Vaping Use: Never used  Substance and Sexual Activity   Alcohol use: Yes    Alcohol/week:  3.0 standard drinks    Types: 3 Glasses of wine per week    Comment: approx 1 drink/night   Drug use: No   Sexual activity: Not Currently    Birth control/protection: None

## 2021-05-06 ENCOUNTER — Telehealth: Payer: Self-pay | Admitting: Radiology

## 2021-05-06 NOTE — Telephone Encounter (Signed)
Needs orders for PT treatment 1-2 x per week for 6-8 weeks. And needs to clarify his weightbearing status on right leg and restrictions for ROM on his ankle. Please call back to advise.

## 2021-05-06 NOTE — Telephone Encounter (Signed)
I called and sw William Barron to advise verbal ok for orders below. The pt is nonweightbearing on the right and is sch for follow up in the office 05/28/2021 with repeat xrays and will advise wtb status at that time.

## 2021-05-28 ENCOUNTER — Ambulatory Visit (INDEPENDENT_AMBULATORY_CARE_PROVIDER_SITE_OTHER): Payer: Medicare Other | Admitting: Orthopedic Surgery

## 2021-05-28 ENCOUNTER — Ambulatory Visit (INDEPENDENT_AMBULATORY_CARE_PROVIDER_SITE_OTHER): Payer: Medicare Other

## 2021-05-28 DIAGNOSIS — M76821 Posterior tibial tendinitis, right leg: Secondary | ICD-10-CM

## 2021-05-28 DIAGNOSIS — M25571 Pain in right ankle and joints of right foot: Secondary | ICD-10-CM | POA: Diagnosis not present

## 2021-05-29 ENCOUNTER — Telehealth: Payer: Self-pay

## 2021-05-29 NOTE — Telephone Encounter (Signed)
Mark informed pt should be weightbearing as tolerated. I did ask Junie Panning, NP about this as he is s/p foot fusion 04/15/21. ?

## 2021-05-29 NOTE — Telephone Encounter (Signed)
Mark, OT with [facility] would like clarification on WB status for patient.  Stated that he will be seeing patient this morning at 11am.  CB# [phone].  Please advise.  Thank you. ?

## 2021-05-31 ENCOUNTER — Encounter: Payer: Self-pay | Admitting: Orthopedic Surgery

## 2021-05-31 NOTE — Progress Notes (Signed)
? ?Office Visit Note ?  ?Patient: William Barron           ?Date of Birth: Mar 19, 1945           ?MRN: [id number] ?Visit Date: 05/28/2021 ?             ?Requested by: [name], MD ?[address] ?[place],  [place] [address] ?PCP: [name], MD ? ?Chief Complaint  ?Patient presents with  ? Right Foot - Routine Post Op  ?  04/15/21 right subtalar and talonavicular fusion   ? ? ? ? ?HPI: ?Patient is a 76 year old gentleman who presents status post right subtalar and talonavicular fusion about 6 weeks out.  Patient feels well no concerns nonweightbearing in a fracture boot. ? ?Assessment & Plan: ?Visit Diagnoses:  ?1. Pain in right ankle and joints of right foot   ? ? ?Plan: Patient will start physical therapy with range of motion and exercises as well as weightbearing in the fracture boot. ? ?Repeat three-view radiographs of the right foot at follow-up. ? ?Follow-Up Instructions: No follow-ups on file.  ? ?Ortho Exam ? ?Patient is alert, oriented, no adenopathy, well-dressed, normal affect, normal respiratory effort. ?Examination the incisions are well-healed there is no redness no cellulitis no signs of infection. ? ?Imaging: ?No results found. ?No images are attached to the encounter. ? ?Labs: ?Lab Results  ?Component Value Date  ? HGBA1C 7.8 (H) 04/10/2021  ? REPTSTATUS 05/04/2015 FINAL 05/01/2015  ? CULT  05/01/2015  ?  >=100,000 COLONIES/mL ESCHERICHIA COLI ?Performed at [facility] ?  ? [place] 05/01/2015  ? ? ? ?Lab Results  ?Component Value Date  ? ALBUMIN 4.0 02/08/2021  ? ALBUMIN 3.8 12/04/2020  ? ALBUMIN 3.9 06/21/2018  ? ? ?No results found for: MG ?Lab Results  ?Component Value Date  ? VD25OH 24.1 (L) 09/26/2014  ? ? ?No results found for: PREALBUMIN ?CBC EXTENDED Latest Ref Rng & Units 02/08/2021 12/04/2020 05/06/2020  ?WBC 4.0 - 10.5 K/uL 10.1 7.7 4.0  ?RBC 4.22 - 5.81 MIL/uL 4.97 4.53 4.17(L)  ?HGB 13.0 - 17.0 g/dL 15.1 13.8 12.8(L)  ?HCT 39.0 - 52.0 % 46.6 43.9 39.9   ?PLT 150 - 400 K/uL 307 300 333  ?NEUTROABS 1.7 - 7.7 K/uL 8.5(H) 6.5 2.3  ?LYMPHSABS 0.7 - 4.0 K/uL 0.5(L) 0.5(L) 0.8  ? ? ? ?There is no height or weight on file to calculate BMI. ? ?Orders:  ?Orders Placed This Encounter  ?Procedures  ? XR Foot Complete Right  ? ?No orders of the defined types were placed in this encounter. ? ? ? Procedures: ?No procedures performed ? ?Clinical Data: ?No additional findings. ? ?ROS: ? ?All other systems negative, except as noted in the HPI. ?Review of Systems ? ?Objective: ?Vital Signs: There were no vitals taken for this visit. ? ?Specialty Comments:  ?No specialty comments available. ? ?PMFS History: ?Patient Active Problem List  ? Diagnosis Date Noted  ? Posterior tibial tendon dysfunction, right 04/15/2021  ? Posterior tibial tendinitis, right leg   ? Malignant neoplasm of prostate ([place]) 02/11/2020  ? Orthostatic tremor 10/01/2019  ? Educated about COVID-19 virus infection 09/12/2019  ? Dyslipidemia 09/12/2019  ? Nonrheumatic pulmonary valve insufficiency 09/12/2019  ? Red blood cell antibody positive, compatible PRBC difficult to obtain 02/15/2018  ? Diplopia 01/03/2018  ? Benign essential tremor 01/03/2018  ? Pain in joint of right ankle 05/17/2017  ? Foot pain 05/17/2017  ? Peroneal tendinitis 05/17/2017  ? Depression with anxiety  01/03/2017  ? Insomnia 05/05/2016  ? Coronary artery calcification seen on CAT scan   ? DOE (dyspnea on exertion)   ? Fatigue 02/27/2015  ? Elevated homocysteine 09/26/2014  ? Multiple myeloma ([place]) 09/26/2014  ? Acquired pes planus of right foot 07/02/2014  ? Posterior tibial tendon dysfunction 07/02/2014  ? Encounter for antineoplastic chemotherapy 01/02/2014  ? Kahler disease ([place]) 01/02/2014  ? Avitaminosis D 01/02/2014  ? Amyloidosis ([place]) 03/26/2013  ? Enlarged lymph node 02/14/2013  ? Pure hypercholesterolemia 03/29/2011  ? Memory disorder 09/21/2010  ? Labile hypertension 09/21/2010  ? ?Past Medical History:  ?Diagnosis Date  ?  Amyloidosis ([place])   ? Diabetes mellitus without complication ([place])   ? GERD (gastroesophageal reflux disease)   ? Hearing loss   ? Has hearing aids  ? History of kidney stones   ? Hyperlipidemia   ? Multiple myeloma ([place])   ? and amylodosis   ? Nephrolithiasis   ? Occasional tremors   ? Pneumonia   ? Prostate cancer ([place])   ? Prostatitis   ? acute and chronic  ? Skin cancer   ?  ?Family History  ?Problem Relation Age of Onset  ? Pancreatic cancer Father   ? Prostate cancer Father   ? Parkinsonism Mother   ? CAD Brother 38  ? Cancer Paternal Grandmother   ? Breast cancer Neg Hx   ? Colon cancer Neg Hx   ?  ?Past Surgical History:  ?Procedure Laterality Date  ? APPENDECTOMY    ? CARDIAC CATHETERIZATION N/A 06/13/2015  ? Procedure: Left Heart Cath and Coronary Angiography;  Surgeon: Jettie Booze, MD;  Location: [place] [facility];  Service: Cardiovascular;  Laterality: N/A;  ? COLONOSCOPY  04/07/00  ? ELBOW SURGERY  2003  ? right  ? EXTRACORPOREAL SHOCK WAVE LITHOTRIPSY Left 07/17/2018  ? Procedure: EXTRACORPOREAL SHOCK WAVE LITHOTRIPSY (ESWL);  Surgeon: [name], MD;  [facility];  Service: Urology;  Laterality: Left;  ? EXTRACORPOREAL SHOCK WAVE LITHOTRIPSY Right 12/08/2020  ? Procedure: RIGHT EXTRACORPOREAL SHOCK WAVE LITHOTRIPSY (ESWL);  Surgeon: Raynelle Bring, MD;  Location: [facility];  Service: Urology;  Laterality: Right;  ? FOOT ARTHRODESIS Right 04/15/2021  ? Procedure: RIGHT SUBTALAR AND TALONAVICULAR FUSION;  Surgeon: Newt Minion, MD;  Location: [place];  Service: Orthopedics;  Laterality: Right;  ? HERNIA REPAIR    ? ROTATOR CUFF REPAIR    ? ?Social History  ? ?Occupational History  ? Occupation: retired  ?Tobacco Use  ? Smoking status: Former  ?  Packs/day: 3.00  ?  Years: 20.00  ?  Pack years: 60.00  ?  Types: Cigarettes  ?  Quit date: 03/15/1977  ?  Years since quitting: 44.2  ? Smokeless tobacco: Never  ? Tobacco comments:  ?  quit 35 years ago  ?Vaping Use  ? Vaping Use: Never  used  ?Substance and Sexual Activity  ? Alcohol use: Yes  ?  Alcohol/week: 3.0 standard drinks  ?  Types: 3 Glasses of wine per week  ?  Comment: approx 1 drink/night  ? Drug use: No  ? Sexual activity: Not Currently  ?  Birth control/protection: None  ? ? ? ? ? ?

## 2021-06-01 ENCOUNTER — Ambulatory Visit: Payer: Medicare Other | Admitting: Endocrinology

## 2021-06-02 ENCOUNTER — Telehealth: Payer: Self-pay

## 2021-06-02 NOTE — Telephone Encounter (Signed)
Per Dr. Jess Barters notes, full WTB in fracture boot. Okay to work on ROM exercises (no parameters mentioned).  ?

## 2021-06-02 NOTE — Telephone Encounter (Signed)
Theophilus Kinds, PT with [facility] would like to know patient's WB status, ROM, and any restrictions for his right ankle?  CB# [phone].  Please advise.  Thank you. ?

## 2021-06-03 NOTE — Telephone Encounter (Signed)
Called Dorian, PT w/ [facility], LM on Id'd VM of info stated below. ?

## 2021-06-16 ENCOUNTER — Ambulatory Visit: Payer: Medicare Other | Admitting: Family

## 2021-06-25 ENCOUNTER — Ambulatory Visit (INDEPENDENT_AMBULATORY_CARE_PROVIDER_SITE_OTHER): Payer: Medicare Other | Admitting: Orthopedic Surgery

## 2021-06-25 ENCOUNTER — Ambulatory Visit (INDEPENDENT_AMBULATORY_CARE_PROVIDER_SITE_OTHER): Payer: Medicare Other

## 2021-06-25 DIAGNOSIS — M76821 Posterior tibial tendinitis, right leg: Secondary | ICD-10-CM

## 2021-07-07 ENCOUNTER — Encounter: Payer: Self-pay | Admitting: Orthopedic Surgery

## 2021-07-07 NOTE — Progress Notes (Signed)
? ?Office Visit Note ?  ?Patient: William Barron           ?Date of Birth: 06-27-1945           ?MRN: [id number] ?Visit Date: 06/25/2021 ?             ?Requested by: [name], MD ?[address] ?[place],  [place] [address] ?PCP: [name], MD ? ?Chief Complaint  ?Patient presents with  ? Right Foot - Routine Post Op  ?  04/15/21 right subtalar and talonavicular fusion  ? ? ? ? ?HPI: ?Patient is a 76 year old gentleman who is 9 weeks status post right subtalar fusion he has been doing therapy with range of motion exercises weightbearing in the fracture boot. ? ?Patient is scheduled for a Whipple procedure at [facility] May 1. ? ?Assessment & Plan: ?Visit Diagnoses:  ?1. Posterior tibial tendinitis, right leg   ? ? ?Plan: Patient will advance his activities as tolerated with a carbon plate [place] sneakers and a sole orthotic with a compression stocking. ? ?Follow-Up Instructions: No follow-ups on file.  ? ?Ortho Exam ? ?Patient is alert, oriented, no adenopathy, well-dressed, normal affect, normal respiratory effort. ?Examination patient has no tenderness to palpation the incision is well-healed. ? ?Imaging: ?No results found. ?No images are attached to the encounter. ? ?Labs: ?Lab Results  ?Component Value Date  ? HGBA1C 7.8 (H) 04/10/2021  ? REPTSTATUS 05/04/2015 FINAL 05/01/2015  ? CULT  05/01/2015  ?  >=100,000 COLONIES/mL ESCHERICHIA COLI ?Performed at [facility] ?  ? [place] 05/01/2015  ? ? ? ?Lab Results  ?Component Value Date  ? ALBUMIN 4.0 02/08/2021  ? ALBUMIN 3.8 12/04/2020  ? ALBUMIN 3.9 06/21/2018  ? ? ?No results found for: MG ?Lab Results  ?Component Value Date  ? VD25OH 24.1 (L) 09/26/2014  ? ? ?No results found for: PREALBUMIN ? ?  Latest Ref Rng & Units 02/08/2021  ?  9:38 PM 12/04/2020  ?  2:42 AM 05/06/2020  ?  2:25 PM  ?CBC EXTENDED  ?WBC 4.0 - 10.5 K/uL 10.1   7.7   4.0    ?RBC 4.22 - 5.81 MIL/uL 4.97   4.53   4.17    ?Hemoglobin 13.0 - 17.0 g/dL 15.1   13.8   12.8     ?HCT 39.0 - 52.0 % 46.6   43.9   39.9    ?Platelets 150 - 400 K/uL 307   300   333    ?NEUT# 1.7 - 7.7 K/uL 8.5   6.5   2.3    ?Lymph# 0.7 - 4.0 K/uL 0.5   0.5   0.8    ? ? ? ?There is no height or weight on file to calculate BMI. ? ?Orders:  ?Orders Placed This Encounter  ?Procedures  ? XR Foot Complete Right  ? ?No orders of the defined types were placed in this encounter. ? ? ? Procedures: ?No procedures performed ? ?Clinical Data: ?No additional findings. ? ?ROS: ? ?All other systems negative, except as noted in the HPI. ?Review of Systems ? ?Objective: ?Vital Signs: There were no vitals taken for this visit. ? ?Specialty Comments:  ?No specialty comments available. ? ?PMFS History: ?Patient Active Problem List  ? Diagnosis Date Noted  ? Posterior tibial tendon dysfunction, right 04/15/2021  ? Posterior tibial tendinitis, right leg   ? Malignant neoplasm of prostate ([place]) 02/11/2020  ? Orthostatic tremor 10/01/2019  ? Educated about COVID-19 virus infection 09/12/2019  ?  Dyslipidemia 09/12/2019  ? Nonrheumatic pulmonary valve insufficiency 09/12/2019  ? Red blood cell antibody positive, compatible PRBC difficult to obtain 02/15/2018  ? Diplopia 01/03/2018  ? Benign essential tremor 01/03/2018  ? Pain in joint of right ankle 05/17/2017  ? Foot pain 05/17/2017  ? Peroneal tendinitis 05/17/2017  ? Depression with anxiety 01/03/2017  ? Insomnia 05/05/2016  ? Coronary artery calcification seen on CAT scan   ? DOE (dyspnea on exertion)   ? Fatigue 02/27/2015  ? Elevated homocysteine 09/26/2014  ? Multiple myeloma ([place]) 09/26/2014  ? Acquired pes planus of right foot 07/02/2014  ? Posterior tibial tendon dysfunction 07/02/2014  ? Encounter for antineoplastic chemotherapy 01/02/2014  ? Kahler disease ([place]) 01/02/2014  ? Avitaminosis D 01/02/2014  ? Amyloidosis ([place]) 03/26/2013  ? Enlarged lymph node 02/14/2013  ? Pure hypercholesterolemia 03/29/2011  ? Memory disorder 09/21/2010  ? Labile hypertension 09/21/2010   ? ?Past Medical History:  ?Diagnosis Date  ? Amyloidosis ([place])   ? Diabetes mellitus without complication ([place])   ? GERD (gastroesophageal reflux disease)   ? Hearing loss   ? Has hearing aids  ? History of kidney stones   ? Hyperlipidemia   ? Multiple myeloma ([place])   ? and amylodosis   ? Nephrolithiasis   ? Occasional tremors   ? Pneumonia   ? Prostate cancer ([place])   ? Prostatitis   ? acute and chronic  ? Skin cancer   ?  ?Family History  ?Problem Relation Age of Onset  ? Pancreatic cancer Father   ? Prostate cancer Father   ? Parkinsonism Mother   ? CAD Brother 58  ? Cancer Paternal Grandmother   ? Breast cancer Neg Hx   ? Colon cancer Neg Hx   ?  ?Past Surgical History:  ?Procedure Laterality Date  ? APPENDECTOMY    ? CARDIAC CATHETERIZATION N/A 06/13/2015  ? Procedure: Left Heart Cath and Coronary Angiography;  Surgeon: Jettie Booze, MD;  Location: [place] [facility];  Service: Cardiovascular;  Laterality: N/A;  ? COLONOSCOPY  04/07/00  ? ELBOW SURGERY  2003  ? right  ? EXTRACORPOREAL SHOCK WAVE LITHOTRIPSY Left 07/17/2018  ? Procedure: EXTRACORPOREAL SHOCK WAVE LITHOTRIPSY (ESWL);  Surgeon: [name], MD;  [facility];  Service: Urology;  Laterality: Left;  ? EXTRACORPOREAL SHOCK WAVE LITHOTRIPSY Right 12/08/2020  ? Procedure: RIGHT EXTRACORPOREAL SHOCK WAVE LITHOTRIPSY (ESWL);  Surgeon: Raynelle Bring, MD;  Location: [facility];  Service: Urology;  Laterality: Right;  ? FOOT ARTHRODESIS Right 04/15/2021  ? Procedure: RIGHT SUBTALAR AND TALONAVICULAR FUSION;  Surgeon: Newt Minion, MD;  Location: [place];  Service: Orthopedics;  Laterality: Right;  ? HERNIA REPAIR    ? ROTATOR CUFF REPAIR    ? ?Social History  ? ?Occupational History  ? Occupation: retired  ?Tobacco Use  ? Smoking status: Former  ?  Packs/day: 3.00  ?  Years: 20.00  ?  Pack years: 60.00  ?  Types: Cigarettes  ?  Quit date: 03/15/1977  ?  Years since quitting: 44.3  ? Smokeless tobacco: Never  ? Tobacco comments:  ?  quit  35 years ago  ?Vaping Use  ? Vaping Use: Never used  ?Substance and Sexual Activity  ? Alcohol use: Yes  ?  Alcohol/week: 3.0 standard drinks  ?  Types: 3 Glasses of wine per week  ?  Comment: approx 1 drink/night  ? Drug use: No  ? Sexual activity: Not Currently  ?  Birth control/protection:  None  ? ? ? ? ? ?

## 2021-07-08 ENCOUNTER — Telehealth: Payer: Self-pay | Admitting: Cardiology

## 2021-07-08 ENCOUNTER — Telehealth: Payer: Self-pay | Admitting: *Deleted

## 2021-07-08 NOTE — Telephone Encounter (Signed)
Pt agreeable to plan of care for tele pre op appt 07/09/21 @ 9:40. Med rec and consent are done.  ? ?  ?Patient Consent for Virtual Visit  ? ? ?   ? ?JAYCEE PELZER has provided verbal consent on 07/08/2021 for a virtual visit (video or telephone). ? ? ?CONSENT FOR VIRTUAL VISIT FOR:  [name]  ?By participating in this virtual visit I agree to the following: ? ?I hereby voluntarily request, consent and authorize [facility] and its employed or contracted physicians, physician assistants, nurse practitioners or other licensed health care professionals (the Practitioner), to provide me with telemedicine health care services (the ?Services") as deemed necessary by the treating Practitioner. I acknowledge and consent to receive the Services by the Practitioner via telemedicine. I understand that the telemedicine visit will involve communicating with the Practitioner through live audiovisual communication technology and the disclosure of certain medical information by electronic transmission. I acknowledge that I have been given the opportunity to request an in-person assessment or other available alternative prior to the telemedicine visit and am voluntarily participating in the telemedicine visit. ? ?I understand that I have the right to withhold or withdraw my consent to the use of telemedicine in the course of my care at any time, without affecting my right to future care or treatment, and that the Practitioner or I may terminate the telemedicine visit at any time. I understand that I have the right to inspect all information obtained and/or recorded in the course of the telemedicine visit and may receive copies of available information for a reasonable fee.  I understand that some of the potential risks of receiving the Services via telemedicine include:  ?Delay or interruption in medical evaluation due to technological equipment failure or disruption; ?Information transmitted may not be sufficient  (e.g. poor resolution of images) to allow for appropriate medical decision making by the Practitioner; and/or  ?In rare instances, security protocols could fail, causing a breach of personal health information. ? ?Furthermore, I acknowledge that it is my responsibility to provide information about my medical history, conditions and care that is complete and accurate to the best of my ability. I acknowledge that Practitioner's advice, recommendations, and/or decision may be based on factors not within their control, such as incomplete or inaccurate data provided by me or distortions of diagnostic images or specimens that may result from electronic transmissions. I understand that the practice of medicine is not an exact science and that Practitioner makes no warranties or guarantees regarding treatment outcomes. I acknowledge that a copy of this consent can be made available to me via my patient portal ([facility]), or I can request a printed copy by calling the office of [facility].   ? ?I understand that my insurance will be billed for this visit.  ? ?I have read or had this consent read to me. ?I understand the contents of this consent, which adequately explains the benefits and risks of the Services being provided via telemedicine.  ?I have been provided ample opportunity to ask questions regarding this consent and the Services and have had my questions answered to my satisfaction. ?I give my informed consent for the services to be provided through the use of telemedicine in my medical care ? ? ? ?

## 2021-07-08 NOTE — Telephone Encounter (Signed)
Preoperative team, please contact this patient and set up a phone call appointment for further preoperative risk assessment. Please obtain consent and complete medication review. Thank you for your help.  ?

## 2021-07-08 NOTE — Telephone Encounter (Signed)
Pt agreeable to plan of care for tele pre op appt 07/09/21 @ 9:40. Med rec and consent are done.  ?

## 2021-07-08 NOTE — Telephone Encounter (Signed)
? ?  Pre-operative Risk Assessment  ?  ?Patient Name: William Barron  ?DOB: Feb 09, 1946 ?MRN: [id number]  ? ?  ? ?Request for Surgical Clearance   ? ?Procedure:   WHIPPLE ? ?Date of Surgery:  Clearance 07/13/21                              ?   ?Surgeon:  Wray Kearns ?Surgeon's Group or Practice Name:  [facility] ?Phone number:  [phone] - Earnestine Mealing: Dr. Trey Paula PA ?Fax number:  [phone] ?  ?Type of Clearance Requested:   ?- Medical  ?  ?Type of Anesthesia:  General  ?  ?Additional requests/questions:   ? ?Signed, ?Leah Newnam   ?07/08/2021, 1:41 PM   ?

## 2021-07-09 ENCOUNTER — Ambulatory Visit (INDEPENDENT_AMBULATORY_CARE_PROVIDER_SITE_OTHER): Payer: Medicare Other | Admitting: Physician Assistant

## 2021-07-09 DIAGNOSIS — Z01810 Encounter for preprocedural cardiovascular examination: Secondary | ICD-10-CM

## 2021-07-09 NOTE — Progress Notes (Signed)
? ?Virtual Visit via Telephone Note  ? ?This visit type was conducted due to national recommendations for restrictions regarding the COVID-19 Pandemic (e.g. social distancing) in an effort to limit this patient's exposure and mitigate transmission in our community.  Due to his co-morbid illnesses, this patient is at least at moderate risk for complications without adequate follow up.  This format is felt to be most appropriate for this patient at this time.  The patient did not have access to video technology/had technical difficulties with video requiring transitioning to audio format only (telephone).  All issues noted in this document were discussed and addressed.  No physical exam could be performed with this format.  Please refer to the patient's chart for his  consent to telehealth for [facility]. ? ?Evaluation Performed:  Preoperative cardiovascular risk assessment ?_____________  ? ?Date:  07/09/2021  ? ?Patient ID:  William Barron, DOB 24-Jul-1945, MRN [id number] ?Patient Location:  ?Home ?Provider location:   ?Office ? ?Primary Care Provider:  [name], MD ?Primary Cardiologist:  [place] ? ?Chief Complaint  ?  ?76 y.o. y/o male with a h/o coronary artery calcification with nonobstructive CAD by LHC in 2017, moderate pulmonary regurgitation without pulmonary hypertension, prostate cancer s/p radiation, multiple myeloma, amyloidosis (without cardiac involvement noted on cMRI), HTN, HLD, DM2, and recently diagnosed IPMN who is pending a whipple scheduled for 07/13/2021, and presents today for telephonic preoperative cardiovascular risk assessment. ? ?Past Medical History  ?  ?Past Medical History:  ?Diagnosis Date  ? Amyloidosis ([place])   ? Diabetes mellitus without complication ([place])   ? GERD (gastroesophageal reflux disease)   ? Hearing loss   ? Has hearing aids  ? History of kidney stones   ? Hyperlipidemia   ? Multiple myeloma ([place])   ? and amylodosis   ? Nephrolithiasis   ? Occasional tremors    ? Pneumonia   ? Prostate cancer ([place])   ? Prostatitis   ? acute and chronic  ? Skin cancer   ? ?Past Surgical History:  ?Procedure Laterality Date  ? APPENDECTOMY    ? CARDIAC CATHETERIZATION N/A 06/13/2015  ? Procedure: Left Heart Cath and Coronary Angiography;  Surgeon: Jettie Booze, MD;  Location: [place] [facility];  Service: Cardiovascular;  Laterality: N/A;  ? COLONOSCOPY  04/07/00  ? ELBOW SURGERY  2003  ? right  ? EXTRACORPOREAL SHOCK WAVE LITHOTRIPSY Left 07/17/2018  ? Procedure: EXTRACORPOREAL SHOCK WAVE LITHOTRIPSY (ESWL);  Surgeon: [name], MD;  [facility];  Service: Urology;  Laterality: Left;  ? EXTRACORPOREAL SHOCK WAVE LITHOTRIPSY Right 12/08/2020  ? Procedure: RIGHT EXTRACORPOREAL SHOCK WAVE LITHOTRIPSY (ESWL);  Surgeon: Raynelle Bring, MD;  Location: [facility];  Service: Urology;  Laterality: Right;  ? FOOT ARTHRODESIS Right 04/15/2021  ? Procedure: RIGHT SUBTALAR AND TALONAVICULAR FUSION;  Surgeon: Newt Minion, MD;  Location: [facility];  Service: Orthopedics;  Laterality: Right;  ? HERNIA REPAIR    ? ROTATOR CUFF REPAIR    ? ? ?Allergies ? ?Allergies  ?Allergen Reactions  ? Penicillins Swelling and Rash  ?  Pt received ancef on 04-15-2021 without issue  ? ? ?History of Present Illness  ?  ?William Barron is a 76 y.o. male who presents via audio/video conferencing for a telehealth visit today.  Most recent echo from 09/2020 showed an EF of 50%.  Pt was last seen in cardiology clinic on 10/20/2020 by Dr. Percival Spanish.  At that time William Barron was doing  well, without symptoms of angina or decompensation.  The patient is now pending a whipple, which is scheduled for 07/13/2021.  Since his last visit, he has done well and been without symptoms of angina or decompensation.  He is able to achieve > 4 METs without cardiac limitation.  ? ? ?Home Medications  ?  ?Prior to Admission medications   ?Medication Sig Start Date End Date Taking? Authorizing Provider   ?HYDROcodone-acetaminophen (NORCO/VICODIN) 5-325 MG tablet Take 1 tablet by mouth every 4 (four) hours as needed. ?Patient not taking: Reported on 07/08/2021 04/17/21   Newt Minion, MD  ?insulin aspart (NOVOLOG) 100 UNIT/ML FlexPen Inject into the skin 3 (three) times daily. BEFORE [facility] MEAL 06/22/21 06/22/22  [provider]  ?metFORMIN (GLUCOPHAGE-XR) 500 MG 24 hr tablet Take 500 mg by mouth 2 (two) times daily. ?Patient not taking: Reported on 07/08/2021 03/12/21   [provider]  ?Testosterone 20.25 MG/ACT (1.62%) GEL APPLY 4 PUMPS TO SKIN IN THE MORNING (APPLY AFTER SHOWER OR BATH TO CLEAN, DRY, INTACT SKIN ON UPPER ARM OR SHOULDER AREAS ONLY) 06/13/20   [provider]  ?traZODone (DESYREL) 50 MG tablet Take 50 mg by mouth at bedtime as needed for sleep. 01/09/21   [provider]  ? ? ?Physical Exam  ?  ?Vital Signs:  William Barron does not have vital signs available for review today. ? ?Given telephonic nature of communication, physical exam is limited. ?AAOx3. NAD. Normal affect.  Speech and respirations are unlabored. ? ?Accessory Clinical Findings  ?  ?None ? ?Assessment & Plan  ?  ?1.  Preoperative Cardiovascular Risk Assessment: The patient affirms he has been doing well without any new cardiac symptoms. They are able to achieve > 4 METS without cardiac limitations. Per [facility], he is moderate risk for noncardiac surgery.  No further cardiac testing will mitigate this risk further. Therefore, based on ACC/AHA guidelines, the patient would be at acceptable risk for the planned procedure without further cardiovascular testing. The patient was advised that if he develops new symptoms prior to surgery to contact our office to arrange for a follow-up visit, and he verbalized understanding.  ? ? ?A copy of this note will be routed to requesting surgeon. ? ?Time:   ?Today, I have spent 7 minutes with the patient with telehealth technology discussing medical history, symptoms,  and management plan.   ? ? ?Christell Faith, PA-C ? ?07/09/2021, 9:52 AM  ?

## 2021-07-09 NOTE — Telephone Encounter (Signed)
Refaxed tele visit from today ?

## 2021-07-09 NOTE — Telephone Encounter (Signed)
?  Anesthesiologist called and request if they can get a copy of clearance as well. He gave fax# [phone] ?

## 2021-07-09 NOTE — Telephone Encounter (Signed)
Pincus Large PA, calling to clarify if patient is cleared for surgery. Phone: [phone] ?

## 2021-07-09 NOTE — Telephone Encounter (Signed)
I will send to pre op provider to be sure to send clearance to anesthesiologist as well to fax # that was given.  ?

## 2021-07-10 ENCOUNTER — Telehealth: Payer: Self-pay | Admitting: Cardiology

## 2021-07-10 NOTE — Telephone Encounter (Signed)
Spoke with William Barron, aware office note faxed to the number provided. ?

## 2021-07-10 NOTE — Telephone Encounter (Signed)
William Barron calling to f/u on Medical Clearance and Office Visit note for pt. She stated that they have received a fax yet and pt is  schedule to have procedure on 07/13/21. She stated the above info can be faxed to [phone]. Please advise ?

## 2021-07-13 HISTORY — PX: WHIPPLE PROCEDURE: SHX2667

## 2021-07-14 ENCOUNTER — Other Ambulatory Visit: Payer: Self-pay | Admitting: Pharmacist

## 2021-07-16 ENCOUNTER — Ambulatory Visit: Payer: Medicare Other | Admitting: Endocrinology

## 2021-08-11 ENCOUNTER — Encounter (HOSPITAL_COMMUNITY): Payer: Self-pay

## 2021-08-11 ENCOUNTER — Emergency Department (HOSPITAL_COMMUNITY): Payer: Medicare Other

## 2021-08-11 ENCOUNTER — Inpatient Hospital Stay (HOSPITAL_COMMUNITY)
Admission: EM | Admit: 2021-08-11 | Discharge: 2021-08-19 | DRG: 204 | Disposition: A | Discharge status: Rehabilitation Facility | Payer: Medicare Other | Attending: Internal Medicine | Admitting: Internal Medicine

## 2021-08-11 DIAGNOSIS — Z934 Other artificial openings of gastrointestinal tract status: Secondary | ICD-10-CM

## 2021-08-11 DIAGNOSIS — E43 Unspecified severe protein-calorie malnutrition: Secondary | ICD-10-CM | POA: Diagnosis present

## 2021-08-11 DIAGNOSIS — Z8546 Personal history of malignant neoplasm of prostate: Secondary | ICD-10-CM

## 2021-08-11 DIAGNOSIS — C61 Malignant neoplasm of prostate: Secondary | ICD-10-CM

## 2021-08-11 DIAGNOSIS — Z90410 Acquired total absence of pancreas: Secondary | ICD-10-CM

## 2021-08-11 DIAGNOSIS — R066 Hiccough: Secondary | ICD-10-CM | POA: Diagnosis not present

## 2021-08-11 DIAGNOSIS — R29810 Facial weakness: Secondary | ICD-10-CM | POA: Diagnosis present

## 2021-08-11 DIAGNOSIS — K219 Gastro-esophageal reflux disease without esophagitis: Secondary | ICD-10-CM | POA: Diagnosis present

## 2021-08-11 DIAGNOSIS — R633 Feeding difficulties, unspecified: Secondary | ICD-10-CM | POA: Diagnosis present

## 2021-08-11 DIAGNOSIS — Z880 Allergy status to penicillin: Secondary | ICD-10-CM

## 2021-08-11 DIAGNOSIS — K119 Disease of salivary gland, unspecified: Secondary | ICD-10-CM

## 2021-08-11 DIAGNOSIS — D500 Iron deficiency anemia secondary to blood loss (chronic): Secondary | ICD-10-CM | POA: Diagnosis present

## 2021-08-11 DIAGNOSIS — I2510 Atherosclerotic heart disease of native coronary artery without angina pectoris: Secondary | ICD-10-CM | POA: Diagnosis present

## 2021-08-11 DIAGNOSIS — Z9049 Acquired absence of other specified parts of digestive tract: Secondary | ICD-10-CM

## 2021-08-11 DIAGNOSIS — Z79890 Hormone replacement therapy (postmenopausal): Secondary | ICD-10-CM

## 2021-08-11 DIAGNOSIS — E859 Amyloidosis, unspecified: Secondary | ICD-10-CM | POA: Diagnosis present

## 2021-08-11 DIAGNOSIS — H4920 Sixth [abducent] nerve palsy, unspecified eye: Secondary | ICD-10-CM | POA: Diagnosis present

## 2021-08-11 DIAGNOSIS — E8589 Other amyloidosis: Secondary | ICD-10-CM | POA: Diagnosis present

## 2021-08-11 DIAGNOSIS — I493 Ventricular premature depolarization: Secondary | ICD-10-CM | POA: Diagnosis present

## 2021-08-11 DIAGNOSIS — B3781 Candidal esophagitis: Secondary | ICD-10-CM | POA: Diagnosis present

## 2021-08-11 DIAGNOSIS — H9193 Unspecified hearing loss, bilateral: Secondary | ICD-10-CM | POA: Diagnosis present

## 2021-08-11 DIAGNOSIS — I10 Essential (primary) hypertension: Secondary | ICD-10-CM | POA: Diagnosis present

## 2021-08-11 DIAGNOSIS — Z5320 Procedure and treatment not carried out because of patient's decision for unspecified reasons: Principal | ICD-10-CM

## 2021-08-11 DIAGNOSIS — Z931 Gastrostomy status: Secondary | ICD-10-CM

## 2021-08-11 DIAGNOSIS — Z85828 Personal history of other malignant neoplasm of skin: Secondary | ICD-10-CM

## 2021-08-11 DIAGNOSIS — Z751 Person awaiting admission to adequate facility elsewhere: Secondary | ICD-10-CM

## 2021-08-11 DIAGNOSIS — Z681 Body mass index (BMI) 19 or less, adult: Secondary | ICD-10-CM

## 2021-08-11 DIAGNOSIS — Z981 Arthrodesis status: Secondary | ICD-10-CM

## 2021-08-11 DIAGNOSIS — R8271 Bacteriuria: Secondary | ICD-10-CM

## 2021-08-11 DIAGNOSIS — Z974 Presence of external hearing-aid: Secondary | ICD-10-CM

## 2021-08-11 DIAGNOSIS — Z794 Long term (current) use of insulin: Secondary | ICD-10-CM

## 2021-08-11 DIAGNOSIS — C07 Malignant neoplasm of parotid gland: Secondary | ICD-10-CM | POA: Diagnosis present

## 2021-08-11 DIAGNOSIS — E119 Type 2 diabetes mellitus without complications: Secondary | ICD-10-CM | POA: Diagnosis present

## 2021-08-11 DIAGNOSIS — Z79899 Other long term (current) drug therapy: Secondary | ICD-10-CM

## 2021-08-11 DIAGNOSIS — E785 Hyperlipidemia, unspecified: Secondary | ICD-10-CM | POA: Diagnosis present

## 2021-08-11 DIAGNOSIS — Z923 Personal history of irradiation: Secondary | ICD-10-CM

## 2021-08-11 DIAGNOSIS — D649 Anemia, unspecified: Secondary | ICD-10-CM

## 2021-08-11 DIAGNOSIS — C9000 Multiple myeloma not having achieved remission: Secondary | ICD-10-CM | POA: Diagnosis present

## 2021-08-11 DIAGNOSIS — E8809 Other disorders of plasma-protein metabolism, not elsewhere classified: Secondary | ICD-10-CM | POA: Diagnosis present

## 2021-08-11 DIAGNOSIS — Z800 Family history of malignant neoplasm of digestive organs: Secondary | ICD-10-CM

## 2021-08-11 DIAGNOSIS — R9431 Abnormal electrocardiogram [ECG] [EKG]: Secondary | ICD-10-CM

## 2021-08-11 DIAGNOSIS — T85598A Other mechanical complication of other gastrointestinal prosthetic devices, implants and grafts, initial encounter: Secondary | ICD-10-CM | POA: Diagnosis present

## 2021-08-11 DIAGNOSIS — Z87891 Personal history of nicotine dependence: Secondary | ICD-10-CM

## 2021-08-11 DIAGNOSIS — E440 Moderate protein-calorie malnutrition: Secondary | ICD-10-CM | POA: Insufficient documentation

## 2021-08-11 LAB — CBC WITH DIFFERENTIAL/PLATELET
Abs Immature Granulocytes: 0.09 10*3/uL — ABNORMAL HIGH (ref 0.00–0.07)
Basophils Absolute: 0 10*3/uL (ref 0.0–0.1)
Basophils Relative: 0 %
Eosinophils Absolute: 0.3 10*3/uL (ref 0.0–0.5)
Eosinophils Relative: 3 %
HCT: 26.8 % — ABNORMAL LOW (ref 39.0–52.0)
Hemoglobin: 8.1 g/dL — ABNORMAL LOW (ref 13.0–17.0)
Immature Granulocytes: 1 %
Lymphocytes Relative: 7 %
Lymphs Abs: 0.5 10*3/uL — ABNORMAL LOW (ref 0.7–4.0)
MCH: 27.4 pg (ref 26.0–34.0)
MCHC: 30.2 g/dL (ref 30.0–36.0)
MCV: 90.5 fL (ref 80.0–100.0)
Monocytes Absolute: 0.4 10*3/uL (ref 0.1–1.0)
Monocytes Relative: 5 %
Neutro Abs: 7 10*3/uL (ref 1.7–7.7)
Neutrophils Relative %: 84 %
Platelets: 422 10*3/uL — ABNORMAL HIGH (ref 150–400)
RBC: 2.96 MIL/uL — ABNORMAL LOW (ref 4.22–5.81)
RDW: 15.3 % (ref 11.5–15.5)
WBC: 8.4 10*3/uL (ref 4.0–10.5)
nRBC: 0 % (ref 0.0–0.2)

## 2021-08-11 LAB — COMPREHENSIVE METABOLIC PANEL
ALT: 27 U/L (ref 0–44)
AST: 24 U/L (ref 15–41)
Albumin: 2.1 g/dL — ABNORMAL LOW (ref 3.5–5.0)
Alkaline Phosphatase: 406 U/L — ABNORMAL HIGH (ref 38–126)
Anion gap: 8 (ref 5–15)
BUN: 15 mg/dL (ref 8–23)
CO2: 20 mmol/L — ABNORMAL LOW (ref 22–32)
Calcium: 7.5 mg/dL — ABNORMAL LOW (ref 8.9–10.3)
Chloride: 107 mmol/L (ref 98–111)
Creatinine, Ser: 0.71 mg/dL (ref 0.61–1.24)
GFR, Estimated: >60 mL/min (ref >60)
Glucose, Bld: 112 mg/dL — ABNORMAL HIGH (ref 70–99)
Potassium: 3.9 mmol/L (ref 3.5–5.1)
Sodium: 135 mmol/L (ref 135–145)
Total Bilirubin: 0.1 mg/dL — ABNORMAL LOW (ref 0.3–1.2)
Total Protein: 5.1 g/dL — ABNORMAL LOW (ref 6.5–8.1)

## 2021-08-11 LAB — URINALYSIS, ROUTINE W REFLEX MICROSCOPIC
Bilirubin Urine: NEGATIVE
Glucose, UA: NEGATIVE mg/dL
Hgb urine dipstick: NEGATIVE
Ketones, ur: NEGATIVE mg/dL
Leukocytes,Ua: NEGATIVE
Nitrite: NEGATIVE
Protein, ur: NEGATIVE mg/dL
Specific Gravity, Urine: 1.023 (ref 1.005–1.030)
pH: 5 (ref 5.0–8.0)

## 2021-08-11 LAB — LACTIC ACID, PLASMA: Lactic Acid, Venous: 1 mmol/L (ref 0.5–1.9)

## 2021-08-11 LAB — LIPASE, BLOOD: Lipase: 15 U/L (ref 11–51)

## 2021-08-11 LAB — CBG MONITORING, ED: Glucose-Capillary: 102 mg/dL — ABNORMAL HIGH (ref 70–99)

## 2021-08-11 MED ORDER — OSMOLITE 1.2 CAL PO LIQD
1000.0000 mL | ORAL | Status: DC
  Administered 2021-08-12 (×2): 1000 mL
  Filled 2021-08-11 (×3): qty 1000

## 2021-08-11 MED ORDER — ONDANSETRON HCL 4 MG/2ML IJ SOLN
4.0000 mg | Freq: Once | INTRAMUSCULAR | Status: AC
  Administered 2021-08-11: 4 mg via INTRAVENOUS
  Filled 2021-08-11: qty 2

## 2021-08-11 MED ORDER — HYDROMORPHONE HCL 1 MG/ML IJ SOLN
0.5000 mg | Freq: Once | INTRAMUSCULAR | Status: AC
  Administered 2021-08-11: 0.5 mg via INTRAVENOUS
  Filled 2021-08-11: qty 1

## 2021-08-11 MED ORDER — IOHEXOL 300 MG/ML  SOLN
100.0000 mL | Freq: Once | INTRAMUSCULAR | Status: AC | PRN
  Administered 2021-08-11: 100 mL via INTRAVENOUS

## 2021-08-11 NOTE — ED Notes (Signed)
X-ray at bedside

## 2021-08-11 NOTE — ED Triage Notes (Signed)
Pt arrives via EMS from [facility], pt had been at [facility] since 5/1 after having an open whipple, pt was d/c'd today to [facility] and after being there for 15 minutes the facility stated they could not care for him, they did not inform EMS why they could not take care of him. Pt denies any complaints at this time.

## 2021-08-11 NOTE — ED Notes (Signed)
Pt off unit to CT

## 2021-08-11 NOTE — ED Notes (Signed)
MD at bedside. 

## 2021-08-11 NOTE — ED Provider Notes (Incomplete)
  [place] EMERGENCY DEPARTMENT Provider Note   CSN: [id number] Arrival date & time: 08/11/21  1737     History {Add pertinent medical, surgical, social history, OB history to HPI:1} No chief complaint on file.   William Barron is a 76 y.o. male.  HPI     76 year old male with a history of prostate cancer status post radiation in 2022, multiple myeloma, hypertension, hyperlipidemia, CAD, amyloidosis, type 2 diabetes, recent admission for intraductal papillary mucinous neoplasm of the pancreas with pancreaticoduodenectomy w/pancreaticojejunostomy Whipple 03/23/4172, with stay complicated by presumed UTI, delirium, GJ tube replacements, candidal esophagitis,   Past Medical History:  Diagnosis Date   Amyloidosis ([place])    Diabetes mellitus without complication ([place])    GERD (gastroesophageal reflux disease)    Hearing loss    Has hearing aids   History of kidney stones    Hyperlipidemia    Multiple myeloma ([place])    and amylodosis    Nephrolithiasis    Occasional tremors    Pneumonia    Prostate cancer ([place])    Prostatitis    acute and chronic   Skin cancer      Home Medications Prior to Admission medications   Medication Sig Start Date End Date Taking? Authorizing Provider  HYDROcodone-acetaminophen (NORCO/VICODIN) 5-325 MG tablet Take 1 tablet by mouth every 4 (four) hours as needed. Patient not taking: Reported on 07/08/2021 04/17/21   Newt Minion, MD  insulin aspart (NOVOLOG) 100 UNIT/ML FlexPen Inject into the skin 3 (three) times daily. BEFORE [facility] MEAL 06/22/21 06/22/22  [provider]  metFORMIN (GLUCOPHAGE-XR) 500 MG 24 hr tablet Take 500 mg by mouth 2 (two) times daily. Patient not taking: Reported on 07/08/2021 03/12/21   [provider]  Testosterone 20.25 MG/ACT (1.62%) GEL APPLY 4 PUMPS TO SKIN IN THE MORNING (APPLY AFTER SHOWER OR BATH TO CLEAN, DRY, INTACT SKIN ON UPPER ARM OR SHOULDER AREAS ONLY) 06/13/20   [provider]  traZODone (DESYREL) 50 MG tablet Take 50 mg by mouth at bedtime as needed for sleep. 01/09/21   [provider]      Allergies    Penicillins    Review of Systems   Review of Systems  Physical Exam Updated Vital Signs There were no vitals taken for this visit. Physical Exam  ED Results / Procedures / Treatments   Labs (all labs ordered are listed, but only abnormal results are displayed) Labs Reviewed - No data to display  EKG None  Radiology No results found.  Procedures Procedures  {Document cardiac monitor, telemetry assessment procedure when appropriate:1}  Medications Ordered in ED Medications - No data to display  ED Course/ Medical Decision Making/ A&P                           Medical Decision Making  ***  {Document critical care time when appropriate:1} {Document review of labs and clinical decision tools ie heart score, Chads2Vasc2 etc:1}  {Document your independent review of radiology images, and any outside records:1} {Document your discussion with family members, caretakers, and with consultants:1} {Document social determinants of health affecting pt's care:1} {Document your decision making why or why not admission, treatments were needed:1} Final Clinical Impression(s) / ED Diagnoses Final diagnoses:  None    Rx / DC Orders ED Discharge Orders     None

## 2021-08-12 DIAGNOSIS — T85598A Other mechanical complication of other gastrointestinal prosthetic devices, implants and grafts, initial encounter: Secondary | ICD-10-CM | POA: Diagnosis present

## 2021-08-12 DIAGNOSIS — R633 Feeding difficulties, unspecified: Secondary | ICD-10-CM

## 2021-08-12 HISTORY — DX: Other mechanical complication of other gastrointestinal prosthetic devices, implants and grafts, initial encounter: T85.598A

## 2021-08-12 LAB — CBG MONITORING, ED
Glucose-Capillary: 138 mg/dL — ABNORMAL HIGH (ref 70–99)
Glucose-Capillary: 84 mg/dL (ref 70–99)
Glucose-Capillary: 127 mg/dL — ABNORMAL HIGH (ref 70–99)

## 2021-08-12 LAB — TSH: TSH: 0.399 u[IU]/mL (ref 0.350–4.500)

## 2021-08-12 LAB — GLUCOSE, CAPILLARY
Glucose-Capillary: 178 mg/dL — ABNORMAL HIGH (ref 70–99)
Glucose-Capillary: 120 mg/dL — ABNORMAL HIGH (ref 70–99)

## 2021-08-12 LAB — MAGNESIUM: Magnesium: 2 mg/dL (ref 1.7–2.4)

## 2021-08-12 MED ORDER — FLUCONAZOLE 40 MG/ML PO SUSR: 200.0000 mg | ORAL | Status: DC

## 2021-08-12 MED ORDER — METOPROLOL TARTRATE 12.5 MG HALF TABLET
12.5000 mg | ORAL_TABLET | Freq: Two times a day (BID) | ORAL | Status: DC
  Administered 2021-08-12 – 2021-08-19 (×14): 12.5 mg via JEJUNOSTOMY
  Filled 2021-08-12 (×15): qty 1

## 2021-08-12 MED ORDER — CHLORPROMAZINE HCL 25 MG PO TABS
25.0000 mg | ORAL_TABLET | Freq: Three times a day (TID) | ORAL | Status: DC
  Administered 2021-08-12 – 2021-08-13 (×2): 25 mg via JEJUNOSTOMY
  Filled 2021-08-12 (×5): qty 1

## 2021-08-12 MED ORDER — INSULIN ASPART 100 UNIT/ML IJ SOLN
5.0000 [IU] | Freq: Four times a day (QID) | INTRAMUSCULAR | Status: DC
  Administered 2021-08-12: 5 [IU] via SUBCUTANEOUS

## 2021-08-12 MED ORDER — OXYCODONE HCL 5 MG PO TABS
2.5000 mg | ORAL_TABLET | ORAL | Status: DC | PRN
  Administered 2021-08-12 – 2021-08-13 (×2): 2.5 mg
  Filled 2021-08-12 (×2): qty 1

## 2021-08-12 MED ORDER — INSULIN ASPART 100 UNIT/ML IJ SOLN
0.0000 [IU] | Freq: Three times a day (TID) | INTRAMUSCULAR | Status: DC
  Administered 2021-08-12: 3 [IU] via SUBCUTANEOUS
  Administered 2021-08-13 (×2): 5 [IU] via SUBCUTANEOUS
  Administered 2021-08-13: 3 [IU] via SUBCUTANEOUS

## 2021-08-12 MED ORDER — MELATONIN 3 MG PO TABS
6.0000 mg | ORAL_TABLET | Freq: Every day | ORAL | Status: DC
Start: 2021-08-12 — End: 2021-08-19
  Administered 2021-08-12 – 2021-08-17 (×5): 6 mg
  Filled 2021-08-12 (×7): qty 2

## 2021-08-12 MED ORDER — PANTOPRAZOLE 2 MG/ML SUSPENSION
40.0000 mg | Freq: Every day | ORAL | Status: DC
  Administered 2021-08-12 – 2021-08-19 (×8): 40 mg
  Filled 2021-08-12 (×8): qty 20

## 2021-08-12 MED ORDER — FLUCONAZOLE 200 MG PO TABS
200.0000 mg | ORAL_TABLET | Freq: Every day | ORAL | Status: DC
  Administered 2021-08-12 – 2021-08-13 (×2): 200 mg via ORAL
  Filled 2021-08-12 (×3): qty 1

## 2021-08-12 MED ORDER — TESTOSTERONE 20.25 MG/ACT (1.62%) TD GEL: 4.0000 | Freq: Every day | TRANSDERMAL | Status: DC

## 2021-08-12 MED ORDER — OXYCODONE HCL 5 MG/5ML PO SOLN: 2.5000 mg | ORAL | Status: DC

## 2021-08-12 MED ORDER — OMEPRAZOLE 2 MG/ML ORAL SUSPENSION: 20.0000 mg | Freq: Every day | ORAL | Status: DC

## 2021-08-12 MED ORDER — PHENOL 1.4 % MT LIQD
1.0000 | OROMUCOSAL | Status: DC | PRN
  Filled 2021-08-12: qty 177

## 2021-08-12 MED ORDER — ENOXAPARIN SODIUM 40 MG/0.4ML IJ SOSY
40.0000 mg | PREFILLED_SYRINGE | INTRAMUSCULAR | Status: DC
Start: 2021-08-12 — End: 2021-08-19
  Administered 2021-08-12 – 2021-08-18 (×7): 40 mg via SUBCUTANEOUS
  Filled 2021-08-12 (×7): qty 0.4

## 2021-08-12 MED ORDER — TRAZODONE HCL 50 MG PO TABS
50.0000 mg | ORAL_TABLET | Freq: Every evening | ORAL | Status: DC | PRN
  Administered 2021-08-14 – 2021-08-18 (×3): 50 mg
  Filled 2021-08-12 (×4): qty 1

## 2021-08-12 MED ORDER — INSULIN GLARGINE-YFGN 100 UNIT/ML ~~LOC~~ SOLN
6.0000 [IU] | Freq: Every day | SUBCUTANEOUS | Status: DC
  Administered 2021-08-13 – 2021-08-19 (×7): 6 [IU] via SUBCUTANEOUS
  Filled 2021-08-12 (×8): qty 0.06

## 2021-08-12 MED ORDER — MELATONIN 3 MG PO TABS: 3.0000 mg | ORAL_TABLET | Freq: Every day | ORAL | Status: DC

## 2021-08-12 NOTE — H&P (Signed)
History and Physical    William Barron DHR:[id number] DOB: 11/19/1945 DOA: 08/11/2021  PCP: [name], MD (Confirm with patient/family/NH records and if not entered, this has to be entered at [facility] point of entry) Patient coming from: SNF  I have personally briefly reviewed patient's old medical records in [place]  Chief Complaint: Frequent hiccups  HPI: William Barron is a 76 y.o. male with medical history significant of pancreatic mass status post Whipple and J/G tube April-May 2023, multiple myeloma, prostate cancer status post radiation therapy, HTN, HLD, CAD, amyloidosis, IDDM, sent from nursing home for [facility] evaluation.  Patient was admitted to [facility] end of April for finding of pancreatic lesion.  According to [name]'s documentation, patient has strong family history of pancreatic cancer, decision was made for resection of pancreas.  Whipple was performed and postop pathology showed IPMN (intraductal papillary mucinous neoplasm ). After surgery, patient was on TPN briefly then JG tube inserted by [name] services.  J tube feeding started without issue, G tube has been used for continuous suction.  [facility] plan was to discharge patient to SNF, but on arrival at SNF, it was found that SNF cannot provide a G-tube with continuous suction.  As result patient sent back to ED, [name] ED was full and no bed available, patient came to [facility] ED.  The only complaint patient has so far and is the frequent hiccups after bowel Whipple, denies any nauseous vomiting no diarrhea.  He has had mild diffuse abdominal pain, chronically, and no significant changes.  ED Course: Stable no acute issue.  CT abdomen pelvis postop changes of pancreas no acute findings otherwise.  Review of Systems: As per HPI otherwise 14 point review of systems negative.    Past Medical History:  Diagnosis Date   Amyloidosis ([place])    Diabetes mellitus without complication ([place])    GERD (gastroesophageal  reflux disease)    Hearing loss    Has hearing aids   History of kidney stones    Hyperlipidemia    Multiple myeloma ([facility])    and amylodosis    Nephrolithiasis    Occasional tremors    Pneumonia    Prostate cancer ([place])    Prostatitis    acute and chronic   Skin cancer     Past Surgical History:  Procedure Laterality Date   APPENDECTOMY     CARDIAC CATHETERIZATION N/A 06/13/2015   Procedure: Left Heart Cath and Coronary Angiography;  Surgeon: Jettie Booze, MD;  Location: [place] CV LAB;  Service: Cardiovascular;  Laterality: N/A;   COLONOSCOPY  04/07/2000   ELBOW SURGERY  03/15/2001   right   EXTRACORPOREAL SHOCK WAVE LITHOTRIPSY Left 07/17/2018   Procedure: EXTRACORPOREAL SHOCK WAVE LITHOTRIPSY (ESWL);  Surgeon: [name], MD;  Location: [facility];  Service: Urology;  Laterality: Left;   EXTRACORPOREAL SHOCK WAVE LITHOTRIPSY Right 12/08/2020   Procedure: RIGHT EXTRACORPOREAL SHOCK WAVE LITHOTRIPSY (ESWL);  Surgeon: Raynelle Bring, MD;  Location: [facility];  Service: Urology;  Laterality: Right;   FOOT ARTHRODESIS Right 04/15/2021   Procedure: RIGHT SUBTALAR AND TALONAVICULAR FUSION;  Surgeon: Newt Minion, MD;  Location: [place];  Service: Orthopedics;  Laterality: Right;   HERNIA REPAIR     ROTATOR CUFF REPAIR     WHIPPLE PROCEDURE  07/13/2021     reports that he quit smoking about 44 years ago. His smoking use included cigarettes. He has a 60.00 pack-year smoking history. He has never used smokeless  tobacco. He reports current alcohol use of about 3.0 standard drinks per week. He reports that he does not use drugs.  Allergies  Allergen Reactions   Penicillins Swelling and Rash    Pt received ancef on 04-15-2021 without issue    Family History  Problem Relation Age of Onset   Pancreatic cancer Father    Prostate cancer Father    Parkinsonism Mother    CAD Brother 16   Cancer Paternal Grandmother    Breast cancer Neg Hx    Colon cancer Neg  Hx      Prior to Admission medications   Medication Sig Start Date End Date Taking? Authorizing Provider  fluconazole (DIFLUCAN) 40 MG/ML suspension Place 200 mg into feeding tube See admin instructions. Qd x 10 days   Yes [provider]  Insulin Glargine (SEMGLEE [place]) Inject 6 Units into the skin daily at 12 noon.   Yes [provider]  insulin regular (NOVOLIN R) 100 units/mL injection Inject 5 Units into the skin See admin instructions. Every 6 hours while continuous tube feeds are running   Yes [provider]  melatonin 3 MG TABS tablet Place 6 mg into feeding tube at bedtime.   Yes [provider]  omeprazole (FIRST-OMEPRAZOLE) 2 mg/mL SUSP oral suspension Place 20 mg into feeding tube daily.   Yes [provider]  oxyCODONE (ROXICODONE) 5 MG/5ML solution Place 2.5 mg into feeding tube See admin instructions. Every 4 hours as need for pain up to 5 days   Yes [provider]  Testosterone 20.25 MG/ACT (1.62%) GEL Apply 4 Pump topically daily. 06/13/20  Yes [provider]  traZODone (DESYREL) 50 MG tablet Place 50 mg into feeding tube at bedtime as needed for sleep. 01/09/21  Yes [provider]  HYDROcodone-acetaminophen (NORCO/VICODIN) 5-325 MG tablet Take 1 tablet by mouth every 4 (four) hours as needed. Patient not taking: Reported on 07/08/2021 04/17/21   Newt Minion, MD    Physical Exam: Vitals:   08/12/21 1200 08/12/21 1300 08/12/21 1305 08/12/21 1400  BP: 122/70 117/70    Pulse: 87 85 88   Resp: 15 (!) 23 20   Temp:      TempSrc:      SpO2: 99% 98% 99%   Weight:    63.5 kg  Height:    _0  (1.676 m)    Constitutional: NAD, calm, comfortable Vitals:   08/12/21 1200 08/12/21 1300 08/12/21 1305 08/12/21 1400  BP: 122/70 117/70    Pulse: 87 85 88   Resp: 15 (!) 23 20   Temp:      TempSrc:      SpO2: 99% 98% 99%   Weight:    63.5 kg  Height:    _1  (1.676 m)   Eyes: PERRL, lids and conjunctivae  normal ENMT: Mucous membranes are moist. Posterior pharynx clear of any exudate or lesions.Normal dentition.  Neck: normal, supple, no masses, no thyromegaly Respiratory: clear to auscultation bilaterally, no wheezing, no crackles. Normal respiratory effort. No accessory muscle use.  Cardiovascular: Regular rate and rhythm, no murmurs / rubs / gallops. No extremity edema. 2+ pedal pulses. No carotid bruits.  Abdomen: no tenderness, no masses palpated. No hepatosplenomegaly. Bowel sounds positive.  Musculoskeletal: no clubbing / cyanosis. No joint deformity upper and lower extremities. Good ROM, no contractures. Normal muscle tone.  Skin: no rashes, lesions, ulcers. No induration Neurologic: CN 2-12 grossly intact. Sensation intact, DTR normal. Strength 5/5 in all 4.  Psychiatric: Normal judgment and insight. Alert and oriented x 3. Normal mood.     Labs on Admission: I have personally reviewed following labs and imaging studies  CBC: Recent Labs  Lab 08/11/21 1900  WBC 8.4  NEUTROABS 7.0  HGB 8.1*  HCT 26.8*  MCV 90.5  PLT 324*   Basic Metabolic Panel: Recent Labs  Lab 08/11/21 1900  NA 135  K 3.9  CL 107  CO2 20*  GLUCOSE 112*  BUN 15  CREATININE 0.71  CALCIUM 7.5*   GFR: Estimated Creatinine Clearance: 68.4 mL/min (by C-G formula based on SCr of 0.71 mg/dL). Liver Function Tests: Recent Labs  Lab 08/11/21 1900  AST 24  ALT 27  ALKPHOS 406*  BILITOT 0.1*  PROT 5.1*  ALBUMIN 2.1*   Recent Labs  Lab 08/11/21 1900  LIPASE 15   No results for input(s): AMMONIA in the last 168 hours. Coagulation Profile: No results for input(s): INR, PROTIME in the last 168 hours. Cardiac Enzymes: No results for input(s): CKTOTAL, CKMB, CKMBINDEX, TROPONINI in the last 168 hours. BNP (last 3 results) No results for input(s): PROBNP in the last 8760 hours. HbA1C: No results for input(s): HGBA1C in the last 72 hours. CBG: Recent Labs  Lab 08/11/21 2333 08/12/21 0821  08/12/21 1129 08/12/21 1412  GLUCAP 102* 138* 84 127*   Lipid Profile: No results for input(s): CHOL, HDL, LDLCALC, TRIG, CHOLHDL, LDLDIRECT in the last 72 hours. Thyroid Function Tests: No results for input(s): TSH, T4TOTAL, FREET4, T3FREE, THYROIDAB in the last 72 hours. Anemia Panel: No results for input(s): VITAMINB12, FOLATE, FERRITIN, TIBC, IRON, RETICCTPCT in the last 72 hours. Urine analysis:    Component Value Date/Time   COLORURINE YELLOW 08/11/2021 2053   APPEARANCEUR CLEAR 08/11/2021 2053   LABSPEC 1.023 08/11/2021 2053   PHURINE 5.0 08/11/2021 2053   GLUCOSEU NEGATIVE 08/11/2021 2053   HGBUR NEGATIVE 08/11/2021 2053   BILIRUBINUR NEGATIVE 08/11/2021 2053   [place] NEGATIVE 08/11/2021 2053   PROTEINUR NEGATIVE 08/11/2021 2053   UROBILINOGEN 0.2 10/15/2014 0633   NITRITE NEGATIVE 08/11/2021 2053   LEUKOCYTESUR NEGATIVE 08/11/2021 2053    Radiological Exams on Admission: CT ABDOMEN PELVIS W CONTRAST  Result Date: 08/11/2021 CLINICAL DATA:  Abdominal pain.  Recent Whipple procedure. EXAM: CT ABDOMEN AND PELVIS WITH CONTRAST TECHNIQUE: Multidetector CT imaging of the abdomen and pelvis was performed using the standard protocol following bolus administration of intravenous contrast. RADIATION DOSE REDUCTION: This exam was performed according to the departmental dose-optimization program which includes automated exposure control, adjustment of the mA and/or kV according to patient size and/or use of iterative reconstruction technique. CONTRAST:  146m OMNIPAQUE IOHEXOL 300 MG/ML  SOLN COMPARISON:  None Available. FINDINGS: Lower chest: Bulky partially calcified adenopathy in the mediastinum and bilateral hila, unchanged since prior study. Coronary artery and aortic calcifications. Coarsened calcifications in the lingula. Bibasilar atelectasis. No effusions. Hepatobiliary: Prior cholecystectomy.  No focal hepatic abnormality. Pancreas: Interval resection of previously seen  pancreatic head cystic mass. Pancreatic body and tail remain present where there are extensive calcifications, unchanged since prior study. Spleen: No focal abnormality or ductal dilatation. Adrenals/Urinary Tract: Punctate nonobstructing stones in the left kidney. No ureteral stones or hydronephrosis. Adrenal glands and urinary bladder unremarkable. Stomach/Bowel: Feeding tube within the stomach. Nonobstructive bowel gas pattern. Vascular/Lymphatic: Aortic atherosclerosis. No evidence of aneurysm or adenopathy. Reproductive: Prostate enlargement. Other: No free fluid or free air. Small left inguinal hernia containing fat and fluid. Musculoskeletal: No acute bony abnormality. IMPRESSION: Interval resection of  pancreatic head and associated pancreatic head cystic mass. No visible complicating feature. Pancreatic body and tail remain present with extensive calcifications, unchanged since prior study. No complicating postoperative findings. Bulky mediastinal and bilateral hilar adenopathy with stippled calcifications again noted, unchanged since prior study. Aortic atherosclerosis. Prostate enlargement. Electronically Signed   By: Rolm Baptise M.D.   On: 08/11/2021 21:50   DG Chest Portable 1 View  Result Date: 08/11/2021 CLINICAL DATA:  Cough. EXAM: PORTABLE CHEST 1 VIEW COMPARISON:  Chest x-ray dated March 05, 2021. FINDINGS: The heart size and mediastinal contours are within normal limits. Normal pulmonary vascularity. Diffuse tiny interstitial nodularity is unchanged. Chronic scarring at the left lung base and costophrenic angle. No focal consolidation, pleural effusion, or pneumothorax. No acute osseous abnormality. Small bilateral calcified axillary lymph nodes again noted. Gastrostomy tube projecting over the left upper quadrant. IMPRESSION: 1. No active disease. 2. Similar chronic interstitial lung disease. Electronically Signed   By: Titus Dubin M.D.   On: 08/11/2021 19:22    EKG: Independently  reviewed.  Sinus, frequent PVCs  Assessment/Plan Principal Problem:   Feeding tube dysfunction, initial encounter Active Problems:   Feeding difficulties  (please populate well all problems here in Problem List. (For example, if patient is on BP meds at home and you resume or decide to hold them, it is a problem that needs to be her. Same for CAD, COPD, HLD and so on)  Pancreatitis status post Whipple -According to [facility] discharge summary, his hospitalization was complicated.  As patient has had several episodes of postop obstructions, was on NG tube/TPN, but after another episode postop bowel obstruction, JG tube was placed on May 18, which became clogged on May 23 and de-clogged on May 26 and functioning normally with J tube feeding and G tube suctioning since. -As per case management echo in ED, LTAC was contacted for possible transfer. -At this point, given the [place] tube has been functioning well, no indication for inpatient GI consultation.  And CT abdomen pelvis reassuring. -For oncology issue, patient plans for [facility] oncology outpatient follow-up.  IDDM -Continue Lantus -Sliding scale  Candida esophagitis -As per [name]'s discharge summary, plan for total of 2 weeks fluconazole with ending date June 15.  Hiccups -Trial of Thorazine  Chronic blood loss anemia -H&H has been stable since surgery, no symptoms or signs of active bleeding.  Hypoalbuminemia -Likely related to pancreatic cancer.  Dietitian consulted.  Frequent PVCs -K level within normal limits, check magnesium level.  Telemetry monitoring x1 day.  Small dose of metoprolol, check TSH   Borderline prolonged QTc -Qtc=450 today with both fluconazole and Thorazine on board, will recheck QTc tomorrow and June 2nd.   DVT prophylaxis: Lovenox Code Status: Full code Family Communication: None at bedside Disposition Plan: Expect less than 2 midnight hospital stay Consults called: None Admission status: MedSurg  observation   Lequita Halt MD [facility] Pager [phone]  08/12/2021, 2:58 PM

## 2021-08-12 NOTE — ED Notes (Signed)
Followed up with pharmacy about missing thorazine dose

## 2021-08-12 NOTE — ED Notes (Signed)
Dr Miller at bedside. 

## 2021-08-12 NOTE — ED Provider Notes (Signed)
The patient is here on day 2, presented yesterday after he was sent to a nursing facility from [facility] where he had been admitted for a complicated stay.  Over the course of his stay at [facility] he had a Whipple procedure, he had a postoperative course that was complicated by multiple failed NG tube placements, multiple episodes of ileus requiring recurrent NG tube placements and TPN.  Ultimately he had a gastric tube placed by interventional radiology through the left upper anterior abdomen.  He has a gastric port which is used to decompress the stomach and a jejunal port which is used for feeds, this has been going well however when he was discharged to the skilled nursing facility they were unaware of this and unable to take care of these type of functions.  They do not have suction available at this facility to help with the intermittent suction that is needed on the gastric tube.  After extensive work with social work and after consultation with the hospitalist service yesterday it was determined that if this patient could not be placed in a skilled nursing facility (which she cannot) he would need to be admitted to the hospital and a transfer to a long-term [facility] facility such [facility] or [facility] may be necessary.  I have spoken with the patient in person this morning and evaluated him, he does not appear to be in distress, he has no respiratory issues, pain is well controlled, getting TPN, he is on intermittent suction with his gastric tube.  I will discuss with the hospitalist this morning to admit.  It is 11:40 AM - hospitalist paged for admission.  Dr/ Marlowe Sax was aware of case yesterday and was agreeable to the plan.  Per [name] d/c summary:  In the immediate postoperative period, patient continued to do well and was transferred to the stepdown floor. Pain was managed with multimodal analgesia including epidural. NG tube was initially removed on 07/14/21. On 5/4 the patient was transitioned  to clear liquid diet and foley was removed. Unfortunately, later that day, hi NG tube had to be replaced and his foley had to be replaced as well due to urinary retention.On 5/7 the ng tube was removed and so was the epidural. On 5/8, the patient's JP drain was removed. On 5/9, the patient had PICC placed and TPN was started. He also had significant delirium overnight and geriatrics was consulted. On 5/9, the patient also had his NG tube replaced. On 5/10, the patient had a leukocytosis to 16.3 and was treated with Zosyn for a presumed urinary tract infection. On 5/11, the patient had his JP drain removed and he had a 4 hour clamp trial. On 5/12, he had his ng tube removed and was placed on post-surgical bland. On 5/14, the patient was advanced to post-surgical bland diet. On 5/15, TPN was stopped. On 5/17, the patient became distended and needed another NG tube placed. On 5/18, a GJ tube was placed by GI and trickle feeds were started. On 5/23, the patient's GJ-tube became clogged and it was exchanged for another GJ tube by IR. On 5/26, the Force tube flipped into the stomach and was placed back in the jejunum by GI. He was also found to have candida esophagitis and started on fluconazole 200 mg via tube for 14 days. After this, the patient was advanced to goal tube feeds and the G-port was to a gravity drain while the J-port had continuous feeds running. He sometimes needed his G-port  placed to suction to alleviate nausea and to drain the stomach, but the tube was mostly to gravity.    I described the care to the hospitalist at noon - he cannot be placed due to the lack of suction at the [facility] - Hospitalist to admit   Noemi Chapel, MD 08/15/21 1453

## 2021-08-12 NOTE — Discharge Planning (Signed)
William Barron from [facility] called for collateral information as pt family reached out to them to refer pt for admission consideration.

## 2021-08-12 NOTE — ED Notes (Signed)
Pts son [name] would like to be the point person for helping to arrange placement please direct all social work calls to [employer], pt agreed to this plan

## 2021-08-12 NOTE — ED Notes (Signed)
Upon initial shift assessment of pt, pts g-tube port hooked to wall suction, switched back to gravity bag per orders since pt not c/o abdominal pain, hiccups, or distention at this time.

## 2021-08-12 NOTE — Progress Notes (Signed)
CSW spoke with Whitney in admissions at [facility] who stated they cannot take patient back. Whitney told CSW that [name] did not inform them that patient has a GJ tube that needs to be suctioned. Whitney stated their facility cannot accommodate his medical needs. CSW asked if she was familiar with any facilities that can meet his needs and she stated no and maybe a LTAC facility. Patient also has medicare A/B which requires a 3 night stay inpatient status on the Medical floor. CSW would not be able to place patient from the ED due to the medicare wavier ending.

## 2021-08-12 NOTE — Progress Notes (Addendum)
Transition of Care [facility]) - Emergency Department Mini Assessment   Patient Details  Name: William Barron MRN: [id number] Date of Birth: January 02, 1946  Transition of Care [facility]) CM/SW Contact:    Roseanne Kaufman, RN Phone Number: 08/12/2021, 6:30 PM   Clinical Narrative: RNCM received inbound call from [place] stating patient's son [name] wants to speak CM re: immediate placement.   ED Mini Assessment:   RNCM spoke with Drue Dun.  ([phone]) who lives in [place]. Patient was discharged from [facility] after 29 day stay to [facility] SNF on 08/11/21. Per son once at [facility] the patient was not admitted, advised they can not accept the patient (unable to provide services for GJ tube suction). [name] did not attempt to locate a new SNF as they sent the patient to [facility]. Patient has been in [facility] ED since 5/30. The patient's son is requesting immediate placement to another SNF or LTAC since patient has already had more than 3 midnight stay at [facility]. This RNCM advised patient's son that Raquel Sarna with LTAC has already been in contact with [facility] requesting collateral for possible admission per family request. This RNCM advised [name] that it is after hours and most facilities will not accept patient at this time. Per chart review patient is being admitted to inpatient status today. This RNCM advised son that file will be documented of our conversation and will have the night CM Mariann Laster) notify the [facility] CM/SW covering the inpatient to call him in the morning. Patient's son is concerned that patient will have weakness and atrophy from lying in the bed. [name] wants the patient's PT to start to get him rehab.   TOC will continue to follow.   Patient Contact and Communications  Patient's son: [phone]    Admission diagnosis:  Feeding tube dysfunction, initial encounter [T85.598A] Patient Active Problem List   Diagnosis Date Noted   Feeding difficulties 08/12/2021   Feeding tube dysfunction,  initial encounter 08/12/2021   Posterior tibial tendon dysfunction, right 04/15/2021   Posterior tibial tendinitis, right leg    Malignant neoplasm of prostate ([place]) 02/11/2020   Orthostatic tremor 10/01/2019   Educated about COVID-19 virus infection 09/12/2019   Dyslipidemia 09/12/2019   Nonrheumatic pulmonary valve insufficiency 09/12/2019   Red blood cell antibody positive, compatible PRBC difficult to obtain 02/15/2018   Diplopia 01/03/2018   Benign essential tremor 01/03/2018   Pain in joint of right ankle 05/17/2017   Foot pain 05/17/2017   Peroneal tendinitis 05/17/2017   Depression with anxiety 01/03/2017   Insomnia 05/05/2016   Coronary artery calcification seen on CAT scan    DOE (dyspnea on exertion)    Fatigue 02/27/2015   Elevated homocysteine 09/26/2014   Multiple myeloma ([place]) 09/26/2014   Acquired pes planus of right foot 07/02/2014   Posterior tibial tendon dysfunction 07/02/2014   Encounter for antineoplastic chemotherapy 01/02/2014   Kahler disease ([place]) 01/02/2014   Avitaminosis D 01/02/2014   Amyloidosis ([place]) 03/26/2013   Enlarged lymph node 02/14/2013   Pure hypercholesterolemia 03/29/2011   Memory disorder 09/21/2010   Labile hypertension 09/21/2010   PCP:  [name], MD Pharmacy:   [place], [place] [address] Phone: [phone] Fax: [phone]

## 2021-08-12 NOTE — ED Provider Notes (Signed)
[facility] EMERGENCY DEPARTMENT Provider Note   CSN: [id number] Arrival date & time: 08/11/21  1737     History  Chief Complaint  Patient presents with   Needs SNF placement    William Barron is a 76 y.o. male.  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 for rehab and upon arrival [place] they said they were not equipped to take care of him and sent him to the ED.   Given he was just discharged from the hospital, he denies any acute changes today.  Has had some mild cough for the last few days, and with the coughing and removal of the staples today he does feel he is having more pain in his abdomen than he was having 2 days ago but denies significant pain and feels some soreness. No known fevers. No other changes. He and his wife are tired and feeling exhausted after his prolonged hospitalization.      Home Medications Prior to Admission medications   Medication Sig Start Date End Date Taking? Authorizing Provider  fluconazole (DIFLUCAN) 40 MG/ML suspension Place 200 mg into feeding tube See admin instructions. Qd x 10 days   Yes [provider]  Insulin Glargine (SEMGLEE [place]) Inject 6 Units into the skin daily at 12 noon.   Yes [provider]  insulin regular (NOVOLIN R) 100 units/mL injection Inject 5 Units into the skin See admin instructions. Every 6 hours while continuous tube feeds are running   Yes [provider]  melatonin 3 MG TABS tablet Place 6 mg into feeding tube at bedtime.   Yes [provider]   omeprazole (FIRST-OMEPRAZOLE) 2 mg/mL SUSP oral suspension Place 20 mg into feeding tube daily.   Yes [provider]  oxyCODONE (ROXICODONE) 5 MG/5ML solution Place 2.5 mg into feeding tube See admin instructions. Every 4 hours as need for pain up to 5 days   Yes [provider]  Testosterone 20.25 MG/ACT (1.62%) GEL Apply 4 Pump topically daily. 06/13/20  Yes [provider]  traZODone (DESYREL) 50 MG tablet Place 50 mg into feeding tube at bedtime as needed for sleep. 01/09/21  Yes [provider]  HYDROcodone-acetaminophen (NORCO/VICODIN) 5-325 MG tablet Take 1 tablet by mouth every 4 (four) hours as needed. Patient not taking: Reported on 07/08/2021 04/17/21   Newt Minion, MD      Allergies    Penicillins    Review of Systems   Review of Systems  Physical Exam Updated Vital Signs BP 127/68   Pulse 86   Temp 99.3 F (37.4 C) (Oral)   Resp 20   SpO2 97%  Physical Exam Vitals and nursing note reviewed.  Constitutional:      General: He is not in acute distress.    Appearance: He is well-developed. He is not diaphoretic.  HENT:     Head: Normocephalic and atraumatic.  Eyes:     Conjunctiva/sclera: Conjunctivae normal.  Cardiovascular:     Rate and Rhythm: Normal rate and regular rhythm.     Heart sounds: Normal heart sounds. No murmur heard.   No friction rub. No gallop.  Pulmonary:  Effort: Pulmonary effort is normal. No respiratory distress.     Breath sounds: Normal breath sounds. No wheezing or rales.  Abdominal:     General: There is no distension.     Palpations: Abdomen is soft.     Tenderness: There is abdominal tenderness. There is no guarding.     Comments: Mild tenderness Incision C/D/I GJ tube in place  Musculoskeletal:     Cervical back: Normal range of motion.  Skin:    General: Skin is warm and dry.  Neurological:     Mental Status: He is alert and oriented to person, place, and time.    ED Results /  Procedures / Treatments   Labs (all labs ordered are listed, but only abnormal results are displayed) Labs Reviewed  CBC WITH DIFFERENTIAL/PLATELET - Abnormal; Notable for the following components:      Result Value   RBC 2.96 (*)    Hemoglobin 8.1 (*)    HCT 26.8 (*)    Platelets 422 (*)    Lymphs Abs 0.5 (*)    Abs Immature Granulocytes 0.09 (*)    All other components within normal limits  COMPREHENSIVE METABOLIC PANEL - Abnormal; Notable for the following components:   CO2 20 (*)    Glucose, Bld 112 (*)    Calcium 7.5 (*)    Total Protein 5.1 (*)    Albumin 2.1 (*)    Alkaline Phosphatase 406 (*)    Total Bilirubin 0.1 (*)    All other components within normal limits  CBG MONITORING, ED - Abnormal; Notable for the following components:   Glucose-Capillary 102 (*)    All other components within normal limits  URINE CULTURE  LIPASE, BLOOD  LACTIC ACID, PLASMA  URINALYSIS, ROUTINE W REFLEX MICROSCOPIC  LACTIC ACID, PLASMA    EKG EKG Interpretation  Date/Time:  Tuesday Aug 11 2021 20:47:18 EDT Ventricular Rate:  91 PR Interval:  154 QRS Duration: 93 QT Interval:  361 QTC Calculation: 445 R Axis:   12 Text Interpretation: Sinus rhythm Multiple ventricular premature complexes Probable left atrial enlargement Anteroseptal infarct, old No significant change since last tracing Confirmed by Gareth Morgan [phone]) on 08/11/2021 9:15:23 PM  Radiology CT ABDOMEN PELVIS W CONTRAST  Result Date: 08/11/2021 CLINICAL DATA:  Abdominal pain.  Recent Whipple procedure. EXAM: CT ABDOMEN AND PELVIS WITH CONTRAST TECHNIQUE: Multidetector CT imaging of the abdomen and pelvis was performed using the standard protocol following bolus administration of intravenous contrast. RADIATION DOSE REDUCTION: This exam was performed according to the departmental dose-optimization program which includes automated exposure control, adjustment of the mA and/or kV according to patient size and/or use of  iterative reconstruction technique. CONTRAST:  155m OMNIPAQUE IOHEXOL 300 MG/ML  SOLN COMPARISON:  None Available. FINDINGS: Lower chest: Bulky partially calcified adenopathy in the mediastinum and bilateral hila, unchanged since prior study. Coronary artery and aortic calcifications. Coarsened calcifications in the lingula. Bibasilar atelectasis. No effusions. Hepatobiliary: Prior cholecystectomy.  No focal hepatic abnormality. Pancreas: Interval resection of previously seen pancreatic head cystic mass. Pancreatic body and tail remain present where there are extensive calcifications, unchanged since prior study. Spleen: No focal abnormality or ductal dilatation. Adrenals/Urinary Tract: Punctate nonobstructing stones in the left kidney. No ureteral stones or hydronephrosis. Adrenal glands and urinary bladder unremarkable. Stomach/Bowel: Feeding tube within the stomach. Nonobstructive bowel gas pattern. Vascular/Lymphatic: Aortic atherosclerosis. No evidence of aneurysm or adenopathy. Reproductive: Prostate enlargement. Other: No free fluid or free air. Small left inguinal hernia containing fat and fluid.  Musculoskeletal: No acute bony abnormality. IMPRESSION: Interval resection of pancreatic head and associated pancreatic head cystic mass. No visible complicating feature. Pancreatic body and tail remain present with extensive calcifications, unchanged since prior study. No complicating postoperative findings. Bulky mediastinal and bilateral hilar adenopathy with stippled calcifications again noted, unchanged since prior study. Aortic atherosclerosis. Prostate enlargement. Electronically Signed   By: Rolm Baptise M.D.   On: 08/11/2021 21:50   DG Chest Portable 1 View  Result Date: 08/11/2021 CLINICAL DATA:  Cough. EXAM: PORTABLE CHEST 1 VIEW COMPARISON:  Chest x-ray dated March 05, 2021. FINDINGS: The heart size and mediastinal contours are within normal limits. Normal pulmonary vascularity. Diffuse tiny  interstitial nodularity is unchanged. Chronic scarring at the left lung base and costophrenic angle. No focal consolidation, pleural effusion, or pneumothorax. No acute osseous abnormality. Small bilateral calcified axillary lymph nodes again noted. Gastrostomy tube projecting over the left upper quadrant. IMPRESSION: 1. No active disease. 2. Similar chronic interstitial lung disease. Electronically Signed   By: Titus Dubin M.D.   On: 08/11/2021 19:22    Procedures Procedures    Medications Ordered in ED Medications  feeding supplement (OSMOLITE 1.2 CAL) liquid 1,000 mL (1,000 mLs Per Tube New Bag/Given 08/12/21 0035)  insulin glargine-yfgn (SEMGLEE) injection 6 Units (has no administration in time range)  fluconazole (DIFLUCAN) tablet 200 mg (has no administration in time range)  insulin aspart (novoLOG) injection 5 Units (has no administration in time range)  melatonin tablet 3 mg (has no administration in time range)  oxyCODONE (Oxy IR/ROXICODONE) immediate release tablet 2.5 mg (has no administration in time range)  pantoprazole sodium (PROTONIX) 40 mg/20 mL oral suspension 40 mg (has no administration in time range)  phenol (CHLORASEPTIC) mouth spray 1 spray (has no administration in time range)  HYDROmorphone (DILAUDID) injection 0.5 mg (0.5 mg Intravenous Given 08/11/21 1846)  ondansetron (ZOFRAN) injection 4 mg (4 mg Intravenous Given 08/11/21 1848)  iohexol (OMNIPAQUE) 300 MG/ML solution 100 mL (100 mLs Intravenous Contrast Given 08/11/21 2135)    ED Course/ Medical Decision Making/ A&P                           Medical Decision Making Amount and/or Complexity of Data Reviewed Labs: ordered. Radiology: ordered.  Risk OTC drugs. Prescription drug management.     76 year old male with a history of prostate cancer status post radiation in 2022, multiple myeloma, hypertension, hyperlipidemia, CAD, amyloidosis, type 2 diabetes, recent admission for intraductal papillary  mucinous neoplasm of the pancreas with pancreaticoduodenectomy w/pancreaticojejunostomy Whipple 06/17/9199, with stay complicated by presumed UTI, delirium, GJ tube replacements, candidal esophagitis, who was discharged today from [place] to [facility] for rehab and upon arrival [place] they said they were not equipped to take care of him and sent him to the ED.   Discussed with Dr. Hyman Hopes, his surgeon, who was apologetic in regards to Mr. Devera being rejected by his accepting SNF and accepted him to [facility] for readmission as they sorted out appropriate rehab placement.   Initially, it seemed appropriate to transfer him to the facility that had just been caring for him and discussed with the transfer center. They report we cannot send him ED to ED given his stability and after discussing with CMO also felt he needed to wait for a bed and was placed on a waitlist at [facility].  Given Mr. Windt did report some abdominal pain, did CT and labs to evaluate for any complications.  CT shows interval resection of pancreatic head and cystic mass without signs of complicating features.    Labs without significant change in comparison to labs performed at [facility] earlier today, and hgb is improved.    I do not see signs of acute medical or surgical emergency.  Admission does feel appropriate, however, given continued nursing needs and unknown timeline to find a new rehab facility.  I did attempt to call [name] but was given voicemail stating business hours.   Given timeline of getting a bed at [facility], did discuss with hospitalist and discussed that if he does not have a bed or SNF bed by tomorrow at noon, will admit while we await bed placement.  On reevaluation with Mr. Notz and his wife, they report even if a bed was available they do not want to go back to [name] to wait for rehab placement.  He was taken off of the [name] waitlist at his request.      At this time, he is medically stable without signs of acute  complications of his recent surgery. His GJ tube appears to be working appropriately.  Ordered J-tube continuous feeds (note we do not have his previously prescribed formula but did place consult to dietitian).  Due to his delayed gastric emptying, he is on continous Jtube feeds with flushing of J tube and G tube, and intermittent venting/low suction for 30 minutes of G tube if he develops discomfort or hiccups. This was plan set in place at [facility] and on paperwork.   Discussed with hospitalist Dr. Marlowe Sax possible admission given continued nursing needs and feeling that he was appropriate for in hospital care while he is awaiting appropriate SNF placement.  Dr. Marlowe Sax recommended discussing with GI.  Discussed with Dr. Therisa Doyne who felt that admission for rehab placement here at [facility] was appropriate but agree that GI does not need to be involved if there are not concerns for complications at this time and if theoretically complication were to develop it is likely he would need transfer.  At this time, hospitalist feels ED hold is most appropriate as he awaits social work and this can be reevaluated.      Mr Rapaport is a 76yo male with history of Whipple 5/1 and hospital stay at [facility] with discharge to a facility who immediately sent him to the ED for unclear reasons, (secondary to [place] tube needs?). He has had a medical work up here in the ED which does not reveal any acute pathology.  He requires rehab placement. He and his wife are willing to wait in the ED for placement and do not want transfer back to [name] at this time. TOC consult has been placed. Medication orders placed.             Final Clinical Impression(s) / ED Diagnoses Final diagnoses:  Rehabilitation services declined  Jejunostomy tube present [facility])  Gastrointestinal tube present [facility])  History of Whipple procedure    Rx / DC Orders ED Discharge Orders     None         Gareth Morgan, MD 08/12/21 [phone]

## 2021-08-12 NOTE — ED Notes (Signed)
Called [name] transfer as patient is refusing to go, cancelled transfer with [name].

## 2021-08-13 ENCOUNTER — Observation Stay (HOSPITAL_COMMUNITY): Payer: Medicare Other

## 2021-08-13 DIAGNOSIS — Z751 Person awaiting admission to adequate facility elsewhere: Secondary | ICD-10-CM | POA: Diagnosis not present

## 2021-08-13 DIAGNOSIS — I493 Ventricular premature depolarization: Secondary | ICD-10-CM

## 2021-08-13 DIAGNOSIS — D649 Anemia, unspecified: Secondary | ICD-10-CM | POA: Diagnosis not present

## 2021-08-13 DIAGNOSIS — E611 Iron deficiency: Secondary | ICD-10-CM | POA: Diagnosis present

## 2021-08-13 DIAGNOSIS — K118 Other diseases of salivary glands: Secondary | ICD-10-CM | POA: Diagnosis present

## 2021-08-13 DIAGNOSIS — Z880 Allergy status to penicillin: Secondary | ICD-10-CM | POA: Diagnosis not present

## 2021-08-13 DIAGNOSIS — E43 Unspecified severe protein-calorie malnutrition: Secondary | ICD-10-CM | POA: Diagnosis present

## 2021-08-13 DIAGNOSIS — E8809 Other disorders of plasma-protein metabolism, not elsewhere classified: Secondary | ICD-10-CM

## 2021-08-13 DIAGNOSIS — Z931 Gastrostomy status: Secondary | ICD-10-CM | POA: Diagnosis not present

## 2021-08-13 DIAGNOSIS — G250 Essential tremor: Secondary | ICD-10-CM | POA: Diagnosis present

## 2021-08-13 DIAGNOSIS — Z79899 Other long term (current) drug therapy: Secondary | ICD-10-CM | POA: Diagnosis not present

## 2021-08-13 DIAGNOSIS — K219 Gastro-esophageal reflux disease without esophagitis: Secondary | ICD-10-CM | POA: Diagnosis present

## 2021-08-13 DIAGNOSIS — E119 Type 2 diabetes mellitus without complications: Secondary | ICD-10-CM | POA: Diagnosis present

## 2021-08-13 DIAGNOSIS — Z90410 Acquired total absence of pancreas: Secondary | ICD-10-CM | POA: Diagnosis not present

## 2021-08-13 DIAGNOSIS — Z681 Body mass index (BMI) 19 or less, adult: Secondary | ICD-10-CM | POA: Diagnosis not present

## 2021-08-13 DIAGNOSIS — E8589 Other amyloidosis: Secondary | ICD-10-CM | POA: Diagnosis present

## 2021-08-13 DIAGNOSIS — Y838 Other surgical procedures as the cause of abnormal reaction of the patient, or of later complication, without mention of misadventure at the time of the procedure: Secondary | ICD-10-CM | POA: Diagnosis not present

## 2021-08-13 DIAGNOSIS — C9000 Multiple myeloma not having achieved remission: Secondary | ICD-10-CM | POA: Diagnosis present

## 2021-08-13 DIAGNOSIS — R5381 Other malaise: Secondary | ICD-10-CM | POA: Diagnosis present

## 2021-08-13 DIAGNOSIS — Z9049 Acquired absence of other specified parts of digestive tract: Secondary | ICD-10-CM | POA: Diagnosis not present

## 2021-08-13 DIAGNOSIS — C61 Malignant neoplasm of prostate: Secondary | ICD-10-CM | POA: Diagnosis present

## 2021-08-13 DIAGNOSIS — E876 Hypokalemia: Secondary | ICD-10-CM | POA: Diagnosis present

## 2021-08-13 DIAGNOSIS — Z794 Long term (current) use of insulin: Secondary | ICD-10-CM | POA: Diagnosis not present

## 2021-08-13 DIAGNOSIS — D500 Iron deficiency anemia secondary to blood loss (chronic): Secondary | ICD-10-CM | POA: Diagnosis present

## 2021-08-13 DIAGNOSIS — E785 Hyperlipidemia, unspecified: Secondary | ICD-10-CM | POA: Diagnosis present

## 2021-08-13 DIAGNOSIS — K9413 Enterostomy malfunction: Secondary | ICD-10-CM | POA: Diagnosis not present

## 2021-08-13 DIAGNOSIS — R066 Hiccough: Secondary | ICD-10-CM

## 2021-08-13 DIAGNOSIS — Z923 Personal history of irradiation: Secondary | ICD-10-CM | POA: Diagnosis not present

## 2021-08-13 DIAGNOSIS — C07 Malignant neoplasm of parotid gland: Secondary | ICD-10-CM | POA: Diagnosis present

## 2021-08-13 DIAGNOSIS — K119 Disease of salivary gland, unspecified: Secondary | ICD-10-CM

## 2021-08-13 DIAGNOSIS — Z79890 Hormone replacement therapy (postmenopausal): Secondary | ICD-10-CM

## 2021-08-13 DIAGNOSIS — E859 Amyloidosis, unspecified: Secondary | ICD-10-CM | POA: Diagnosis present

## 2021-08-13 DIAGNOSIS — R633 Feeding difficulties, unspecified: Secondary | ICD-10-CM | POA: Diagnosis present

## 2021-08-13 DIAGNOSIS — R000 Tachycardia, unspecified: Secondary | ICD-10-CM | POA: Diagnosis not present

## 2021-08-13 DIAGNOSIS — R9431 Abnormal electrocardiogram [ECG] [EKG]: Secondary | ICD-10-CM

## 2021-08-13 DIAGNOSIS — I2510 Atherosclerotic heart disease of native coronary artery without angina pectoris: Secondary | ICD-10-CM | POA: Diagnosis present

## 2021-08-13 DIAGNOSIS — Z934 Other artificial openings of gastrointestinal tract status: Secondary | ICD-10-CM | POA: Diagnosis present

## 2021-08-13 DIAGNOSIS — D62 Acute posthemorrhagic anemia: Secondary | ICD-10-CM | POA: Diagnosis present

## 2021-08-13 DIAGNOSIS — H532 Diplopia: Secondary | ICD-10-CM | POA: Diagnosis present

## 2021-08-13 DIAGNOSIS — Z8546 Personal history of malignant neoplasm of prostate: Secondary | ICD-10-CM | POA: Diagnosis not present

## 2021-08-13 DIAGNOSIS — I10 Essential (primary) hypertension: Secondary | ICD-10-CM | POA: Diagnosis present

## 2021-08-13 DIAGNOSIS — B3781 Candidal esophagitis: Secondary | ICD-10-CM | POA: Diagnosis present

## 2021-08-13 DIAGNOSIS — R8271 Bacteriuria: Secondary | ICD-10-CM | POA: Diagnosis present

## 2021-08-13 DIAGNOSIS — H9190 Unspecified hearing loss, unspecified ear: Secondary | ICD-10-CM | POA: Diagnosis present

## 2021-08-13 DIAGNOSIS — H4920 Sixth [abducent] nerve palsy, unspecified eye: Secondary | ICD-10-CM | POA: Diagnosis present

## 2021-08-13 DIAGNOSIS — D509 Iron deficiency anemia, unspecified: Secondary | ICD-10-CM | POA: Diagnosis not present

## 2021-08-13 DIAGNOSIS — R29810 Facial weakness: Secondary | ICD-10-CM

## 2021-08-13 DIAGNOSIS — D75838 Other thrombocytosis: Secondary | ICD-10-CM | POA: Diagnosis present

## 2021-08-13 LAB — MRSA NEXT GEN BY PCR, NASAL: MRSA by PCR Next Gen: NOT DETECTED

## 2021-08-13 LAB — BASIC METABOLIC PANEL
Anion gap: 8 (ref 5–15)
BUN: 15 mg/dL (ref 8–23)
CO2: 25 mmol/L (ref 22–32)
Calcium: 8.5 mg/dL — ABNORMAL LOW (ref 8.9–10.3)
Chloride: 99 mmol/L (ref 98–111)
Creatinine, Ser: 0.95 mg/dL (ref 0.61–1.24)
GFR, Estimated: >60 mL/min (ref >60)
Glucose, Bld: 215 mg/dL — ABNORMAL HIGH (ref 70–99)
Potassium: 4.6 mmol/L (ref 3.5–5.1)
Sodium: 132 mmol/L — ABNORMAL LOW (ref 135–145)

## 2021-08-13 LAB — GLUCOSE, CAPILLARY
Glucose-Capillary: 146 mg/dL — ABNORMAL HIGH (ref 70–99)
Glucose-Capillary: 184 mg/dL — ABNORMAL HIGH (ref 70–99)
Glucose-Capillary: 230 mg/dL — ABNORMAL HIGH (ref 70–99)
Glucose-Capillary: 221 mg/dL — ABNORMAL HIGH (ref 70–99)
Glucose-Capillary: 153 mg/dL — ABNORMAL HIGH (ref 70–99)
Glucose-Capillary: 194 mg/dL — ABNORMAL HIGH (ref 70–99)

## 2021-08-13 MED ORDER — INSULIN ASPART 100 UNIT/ML IJ SOLN
0.0000 [IU] | INTRAMUSCULAR | Status: DC
  Administered 2021-08-13 – 2021-08-14 (×3): 2 [IU] via SUBCUTANEOUS
  Administered 2021-08-14: 3 [IU] via SUBCUTANEOUS
  Administered 2021-08-14 (×2): 2 [IU] via SUBCUTANEOUS
  Administered 2021-08-14 – 2021-08-15 (×2): 3 [IU] via SUBCUTANEOUS
  Administered 2021-08-15 – 2021-08-16 (×4): 1 [IU] via SUBCUTANEOUS
  Administered 2021-08-16 (×2): 2 [IU] via SUBCUTANEOUS
  Administered 2021-08-16: 1 [IU] via SUBCUTANEOUS
  Administered 2021-08-17: 2 [IU] via SUBCUTANEOUS
  Administered 2021-08-17: 1 [IU] via SUBCUTANEOUS
  Administered 2021-08-17 (×2): 2 [IU] via SUBCUTANEOUS
  Administered 2021-08-18: 3 [IU] via SUBCUTANEOUS
  Administered 2021-08-18: 1 [IU] via SUBCUTANEOUS
  Administered 2021-08-18 (×2): 2 [IU] via SUBCUTANEOUS
  Administered 2021-08-19: 3 [IU] via SUBCUTANEOUS
  Administered 2021-08-19 (×2): 1 [IU] via SUBCUTANEOUS

## 2021-08-13 MED ORDER — CHLORHEXIDINE GLUCONATE 0.12 % MT SOLN
15.0000 mL | Freq: Two times a day (BID) | OROMUCOSAL | Status: DC
  Administered 2021-08-13 – 2021-08-19 (×13): 15 mL via OROMUCOSAL
  Filled 2021-08-13 (×13): qty 15

## 2021-08-13 MED ORDER — GLUCERNA 1.5 CAL PO LIQD
1000.0000 mL | ORAL | Status: DC
  Administered 2021-08-13 – 2021-08-19 (×6): 1000 mL
  Filled 2021-08-13 (×10): qty 1000

## 2021-08-13 MED ORDER — ORAL CARE MOUTH RINSE
15.0000 mL | Freq: Two times a day (BID) | OROMUCOSAL | Status: DC
  Administered 2021-08-13 – 2021-08-19 (×11): 15 mL via OROMUCOSAL

## 2021-08-13 MED ORDER — GABAPENTIN 250 MG/5ML PO SOLN
100.0000 mg | Freq: Three times a day (TID) | ORAL | Status: DC
  Administered 2021-08-13 – 2021-08-19 (×17): 100 mg
  Filled 2021-08-13 (×20): qty 2

## 2021-08-13 NOTE — Progress Notes (Signed)
Informed pt about blood exposure to staff while collecting his blood earlier. Made him aware that, that was the reason for the extra blood draw that was done, but assured him that he would not get charged for it. Pt verbalized understanding. Pt might need some reinforcing d/t pt being more confused tonight.

## 2021-08-13 NOTE — Assessment & Plan Note (Addendum)
ixazomib is on d/c meds from [name] Also on 4 mg dexamethazone once weekly  Holding at this time (he's not currently taking these meds)  Doctor is at [facility] follow-up.

## 2021-08-13 NOTE — Assessment & Plan Note (Addendum)
Trend Low iron, sat ratios, normal ferritin, folate, b12.  Iron def, aocd? Will give iron

## 2021-08-13 NOTE — Assessment & Plan Note (Addendum)
-   Electrolytes repleted. -Resolved.

## 2021-08-13 NOTE — Progress Notes (Signed)
PROGRESS NOTE    William Barron  SNK:[id number] DOB: March 27, 1945 DOA: 08/11/2021 PCP: William Hatchet, MD  Chief Complaint  Patient presents with   Needs SNF placement    Brief Narrative:  William Barron is William Barron 76 y.o. male with medical history significant of pancreatic mass status post Whipple and J/G tube April-May 2023, multiple myeloma, prostate cancer status post radiation therapy, HTN, HLD, CAD, amyloidosis, IDDM who was recently discharged from [name] 5/30 after prolonged hospital stay after whipple procedure.  According to [name]'s documentation, patient has strong family history of pancreatic cancer, decision was made for resection of pancreas.  Whipple was performed and postop pathology extensive involvement by amyloidosis (no definite evidence of IPMN or mucinous cystic neoplasm component is identified) (07/13/2021 pathology - previous note had mentioned pathology showing IPMN, but I don't see this). After surgery, patient was on TPN briefly then JG tube inserted by [name] services. He arrived at [facility] on 5/30 and they were under the impression they could not handle his GJ.  They did not receive his discharge summary until he was en route, per my discussion with thme.  It sounds like they thought he needed continuous suction (though per discharge summary, it was noted that g port could be placed to suction for nausea as needed, but it was mostly to gravity).  They initially asked to go back to [name], but EMS wouldn't take them there.  Due to his continued intractable hiccups and need for care and the belief they were unable to manage him at [facility], he came to the [facility] ED.      Assessment & Plan:   Principal Problem:   H/O Whipple procedure Active Problems:   Intractable hiccups   Candida esophagitis (HCC)   Facial droop   Diabetes (HCC)   Anemia   Hypoalbuminemia   Frequent PVCs   Prolonged QT interval   Amyloidosis (HCC)   Multiple myeloma (HCC)   Prostate cancer (HCC)    Hormone replacement therapy   Lesion of parotid gland   Protein-calorie malnutrition, severe (HCC)   Feeding difficulties   Feeding tube dysfunction, initial encounter   Assessment and Plan: * H/O Whipple procedure Seems like some confusion regarding need for suction with the GJ -> ultimately led to him being admitted here He's s/p open whipple on 5/1 (care everywhere documentation confusing regarding dates of surgery) Post op course complicated by delirium, UTI, recurrent/continued need for NG placement GJ placed on 5/18 At the time of discharge, had continuous feeds via j port and g port was placed gravity mostly (occasionally placed to suction to alleviate nausea and drain stomach) Per dc instructions, ok to hook g port to suction of patient bc distended, nauseated or with significant hiccups, should be on continuous j tube feeds    Intractable hiccups Per my discussion with William Barron from [place], intractable hiccups were part of the reason he [facility] Will stop thorazine and trial gabapentin  Follow, as above, can hook g port to suction as needed for distension or hiccups  Facial droop Mild L facial droop, he'd complained of confusion last night and was concerned he might be having William Barron stroke.  May have been related to thorazine? 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 Surgical pathology from 5/1 with hepatic artery LN involved by amyloid, gallbladder involved by amyloid, pancreas, portion of stomach, and duodenum with  extensive involvement by amyloidosis, 13 lymph nodes involved by amyloidosis, negative for tumor (see report in care everywhere)  Multiple myeloma ([place]) ixazomib is on d/c meds from [name] Also on 4 mg dexamethazone once weekly  Holding at this time, will follow with pt/wife  Prostate cancer [facility]) S/p radiation Will follow with pt/wife  Hormone replacement therapy Testosterone is on his d/c meds from [facility] for now  Lesion of parotid gland Needs ENT follow up outpatient  Concerning for parotid neoplasm  Protein-calorie malnutrition, severe ([place]) Tube feeds RD c/s      DVT prophylaxis: lovenox Code Status: full Family Communication: wife Disposition:   Status is: Inpatient Remains inpatient appropriate because: pending safe discharge plan   Consultants:  none  Procedures:  none  Antimicrobials:  Anti-infectives (From admission, onward)    Start     Dose/Rate Route Frequency Ordered Stop   08/12/21 1348  fluconazole (DIFLUCAN) 40 MG/ML suspension 200 mg  Status:  Discontinued       Note to Pharmacy: Qd x 10 days     200 mg Per Tube See admin instructions 08/12/21 1352 08/12/21 1354   08/12/21 1000  fluconazole (DIFLUCAN) tablet 200 mg        200 mg Oral Daily 08/12/21 0152 08/22/21 0959       Subjective: Last night around 8 he felt confused, thought he had [name] stroke Now resolved Long discussion with pt and wife  Objective: Vitals:   08/13/21 0526 08/13/21 0559 08/13/21 0743 08/13/21 1551  BP: (!) 108/49  (!) 114/53 (!) 98/53  Pulse: 83  78 80  Resp: _0 Temp: (!) 97.5 F (36.4 C)  98.7 F (37.1 C) 98 F (36.7 C)  TempSrc: Oral  Oral Oral  SpO2: 99%  99% 99%  Weight:  56.5 kg    Height:        Intake/Output Summary (Last 24 hours) at 08/13/2021 1636 Last data filed at 08/13/2021 1540 Gross per 24 hour  Intake 0 ml  Output 2500 ml  Net -2500 ml   Filed Weights   08/12/21 1400 08/13/21 0559  Weight: 63.5 kg 56.5 kg     Examination:  General exam: Appears calm and comfortable  Respiratory system: unlabored Cardiovascular system: RRR Gastrointestinal system: Abdomen is nondistended, soft and nontender. [place] Central nervous system: Alert and oriented. Mild L facial droop, 6th nerve palsy Extremities: no LEE    Data Reviewed: I have personally reviewed following labs and imaging studies  CBC: Recent Labs  Lab 08/11/21 1900  WBC 8.4  NEUTROABS 7.0  HGB 8.1*  HCT 26.8*  MCV 90.5  PLT 422*    Basic Metabolic Panel: Recent Labs  Lab 08/11/21 1900 08/12/21 1921 08/13/21 0604  NA 135  --  132*  K 3.9  --  4.6  CL 107  --  99  CO2 20*  --  25  GLUCOSE 112*  --  215*  BUN 15  --  15  CREATININE 0.71  --  0.95  CALCIUM 7.5*  --  8.5*  MG  --  2.0  --  GFR: Estimated Creatinine Clearance: 51.2 mL/min (by C-G formula based on SCr of 0.95 mg/dL).  Liver Function Tests: Recent Labs  Lab 08/11/21 1900  AST 24  ALT 27  ALKPHOS 406*  BILITOT 0.1*  PROT 5.1*  ALBUMIN 2.1*    CBG: Recent Labs  Lab 08/13/21 0132 08/13/21 0524 08/13/21 0738 08/13/21 1311 08/13/21 1547  GLUCAP 146* 184* 230* 221* 153*     Recent Results (from the past 240 hour(s))  Urine Culture     Status: Abnormal (Preliminary result)   Collection Time: 08/11/21  8:53 PM   Specimen: Urine, Clean Catch  Result Value Ref Range Status   Specimen Description URINE, CLEAN CATCH  Final   Special Requests NONE  Final   Culture (Nayshawn Mesta)  Final    70,000 COLONIES/mL KLEBSIELLA PNEUMONIAE 20,000 COLONIES/mL CITROBACTER FREUNDII CULTURE REINCUBATED FOR BETTER GROWTH Performed at [facility], [place] [address], [place], [place] [address]    Report Status PENDING  Incomplete         Radiology Studies: MR BRAIN WO CONTRAST  Result Date: 08/13/2021 CLINICAL DATA:  Neuro deficit, stroke suspected EXAM: MRI HEAD WITHOUT CONTRAST TECHNIQUE: Multiplanar, multiecho pulse sequences of the brain and surrounding  structures were obtained without intravenous contrast. COMPARISON:  CT head 02/08/2021, MR head 01/16/2018 FINDINGS: Brain: There is no acute intracranial hemorrhage, extra-axial fluid collection, or acute infarct There is mild global parenchymal volume loss with prominence of the ventricular system and extra-axial CSF spaces. The ventricles are stable in size since 2019. Patchy foci of FLAIR signal abnormality throughout the subcortical and periventricular white matter are overall unchanged, nonspecific but likely reflecting sequela of chronic white matter microangiopathy. There is no suspicious parenchymal signal abnormality. There is no mass lesion. There is no mass effect or midline shift. Vascular: Normal flow voids. Skull and upper cervical spine: Normal marrow signal. Sinuses/Orbits: Paranasal sinuses are clear. The globes and orbits are unremarkable. Other: There is an ovoid lesion in the region of the right parotid gland measuring 1.4 cm x 1.9 cm (5-2, 10-10). This lesion was likely present on the study from 2019 but is increased in size IMPRESSION: 1. No acute intracranial pathology. 2. Mild parenchymal volume loss and chronic white matter microangiopathy. 3. 1.9 cm lesion in the region of the right parotid gland is increased in size since 2019 suspicious for parotid neoplasm. Recommend nonemergent ENT referral. Electronically Signed   By: Valetta Mole M.D.   On: 08/13/2021 12:40   CT ABDOMEN PELVIS W CONTRAST  Result Date: 08/11/2021 CLINICAL DATA:  Abdominal pain.  Recent Whipple procedure. EXAM: CT ABDOMEN AND PELVIS WITH CONTRAST TECHNIQUE: Multidetector CT imaging of the abdomen and pelvis was performed using the standard protocol following bolus administration of intravenous contrast. RADIATION DOSE REDUCTION: This exam was performed according to the departmental dose-optimization program which includes automated exposure control, adjustment of the mA and/or kV according to patient size and/or  use of iterative reconstruction technique. CONTRAST:  169m OMNIPAQUE IOHEXOL 300 MG/ML  SOLN COMPARISON:  None Available. FINDINGS: Lower chest: Bulky partially calcified adenopathy in the mediastinum and bilateral hila, unchanged since prior study. Coronary artery and aortic calcifications. Coarsened calcifications in the lingula. Bibasilar atelectasis. No effusions. Hepatobiliary: Prior cholecystectomy.  No focal hepatic abnormality. Pancreas: Interval resection of previously seen pancreatic head cystic mass. Pancreatic body and tail remain present where there are extensive calcifications, unchanged since prior study. Spleen: No focal abnormality or ductal dilatation. Adrenals/Urinary Tract: Punctate nonobstructing stones in the left  kidney. No ureteral stones or hydronephrosis. Adrenal glands and urinary bladder unremarkable. Stomach/Bowel: Feeding tube within the stomach. Nonobstructive bowel gas pattern. Vascular/Lymphatic: Aortic atherosclerosis. No evidence of aneurysm or adenopathy. Reproductive: Prostate enlargement. Other: No free fluid or free air. Small left inguinal hernia containing fat and fluid. Musculoskeletal: No acute bony abnormality. IMPRESSION: Interval resection of pancreatic head and associated pancreatic head cystic mass. No visible complicating feature. Pancreatic body and tail remain present with extensive calcifications, unchanged since prior study. No complicating postoperative findings. Bulky mediastinal and bilateral hilar adenopathy with stippled calcifications again noted, unchanged since prior study. Aortic atherosclerosis. Prostate enlargement. Electronically Signed   By: Rolm Baptise M.D.   On: 08/11/2021 21:50   DG Chest Portable 1 View  Result Date: 08/11/2021 CLINICAL DATA:  Cough. EXAM: PORTABLE CHEST 1 VIEW COMPARISON:  Chest x-ray dated March 05, 2021. FINDINGS: The heart size and mediastinal contours are within normal limits. Normal pulmonary vascularity. Diffuse  tiny interstitial nodularity is unchanged. Chronic scarring at the left lung base and costophrenic angle. No focal consolidation, pleural effusion, or pneumothorax. No acute osseous abnormality. Small bilateral calcified axillary lymph nodes again noted. Gastrostomy tube projecting over the left upper quadrant. IMPRESSION: 1. No active disease. 2. Similar chronic interstitial lung disease. Electronically Signed   By: Titus Dubin M.D.   On: 08/11/2021 19:22        Scheduled Meds:  chlorhexidine  15 mL Mouth Rinse BID   enoxaparin (LOVENOX) injection  40 mg Subcutaneous Q24H   fluconazole  200 mg Oral Daily   gabapentin  100 mg Per Tube Q8H   insulin aspart  0-9 Units Subcutaneous Q4H   insulin glargine-yfgn  6 Units Subcutaneous Daily   mouth rinse  15 mL Mouth Rinse q12n4p   melatonin  6 mg Per Tube QHS   metoprolol tartrate  12.5 mg Per J Tube BID   pantoprazole sodium  40 mg Per Tube Daily   Continuous Infusions:  feeding supplement (GLUCERNA 1.5 CAL) 1,000 mL (08/13/21 1318)     LOS: 0 days    Time spent: over 30 min    Fayrene Helper, MD [facility]   To contact the attending provider between 7A-7P or the covering provider during after hours 7P-7A, please log into the web site [email or web address] and access using universal [place] password for that web site. If you do not have the password, please call the hospital operator.  08/13/2021, 4:36 PM

## 2021-08-13 NOTE — Assessment & Plan Note (Addendum)
Mild L facial droop, he'd complained of confusion on the day of admission.  May have been related to thorazine? MRI brain without acute intracranial path. -Outpatient follow-up.

## 2021-08-13 NOTE — Assessment & Plan Note (Addendum)
Patient noted to be on tube feeds. -Outpatient follow-up.

## 2021-08-13 NOTE — Assessment & Plan Note (Addendum)
Patient placed on 14 days fluconazole per [name] d/c summary He was discharged with 10 days worth on 5/30, should be complete 6/9. -Patient will be discharged to CIR.

## 2021-08-13 NOTE — Assessment & Plan Note (Addendum)
Resumed home testosterone -Patient to discharge to CIR. -Outpatient follow-up.

## 2021-08-13 NOTE — Assessment & Plan Note (Addendum)
Patient placed on telemetry.   -Patient started on metoprolol.   -Outpatient follow-up.

## 2021-08-13 NOTE — Hospital Course (Addendum)
William Barron is William Barron 76 y.o. male with medical history significant of pancreatic mass status post Whipple and J/G tube April-May 2023, multiple myeloma, prostate cancer status post radiation therapy, HTN, HLD, CAD, amyloidosis, IDDM who was recently discharged from [name] 5/30 after prolonged hospital stay after whipple procedure.  According to [name]'s documentation, patient has strong family history of pancreatic cancer, decision was made for resection of pancreas.  Whipple was performed and postop pathology extensive involvement by amyloidosis (no definite evidence of IPMN or mucinous cystic neoplasm component is identified) (07/13/2021 pathology - previous note had mentioned pathology showing IPMN, but I don't see this). After surgery, patient was on TPN briefly then JG tube inserted by [name] services. He arrived at [facility] on 5/30 and they were under the impression they could not handle his GJ.  They did not receive his discharge summary until he was en route, per my discussion with thme.  It sounds like they thought he needed continuous suction (though per discharge summary, it was noted that g port could be placed to suction for nausea as needed, but it was mostly to gravity).  They initially asked to go back to [name], but EMS wouldn't take them there.  Due to his continued intractable hiccups and need for care and the belief they were unable to manage him at [facility], he came to the [facility] ED.    Currently medically stable for discharge.  Issues were regarding to confusion initially at SNF, then was sent to ED for intractable hiccups as above (per my discussion with wife, staff at [facility]).  At this time, likely CIR discharge 6/7 depending on bed availability?

## 2021-08-13 NOTE — Assessment & Plan Note (Addendum)
Tube feeds RD c/s and follow the patient during the hospitalization. -Outpatient follow-up.

## 2021-08-13 NOTE — NC FL2 (Signed)
[place] LEVEL OF CARE SCREENING TOOL     IDENTIFICATION  Patient Name: William Barron Birthdate: 05-Oct-1945 Sex: male Admission Date (Current Location): 08/11/2021  [facility] and [place] Number:  Herbalist and Address:  [facility], [place] [address], [place], [place] [address]      Provider Number: [id number]  Attending Physician Name and Address:  Elodia Florence., *  Relative Name and Phone Number:  Jorgen, Wolfinger (Spouse)   [phone]    Current Level of Care: Hospital Recommended Level of Care: [place] Prior Approval Number:    Date Approved/Denied:   PASRR Number:    Discharge Plan: SNF    Current Diagnoses: Patient Active Problem List   Diagnosis Date Noted   H/O Whipple procedure 08/13/2021   Intractable hiccups 08/13/2021   Candida esophagitis ([place]) 08/13/2021   Diabetes ([place]) 08/13/2021   Anemia 08/13/2021   Hypoalbuminemia 08/13/2021   Frequent PVCs 08/13/2021   Prolonged QT interval 08/13/2021   Facial droop 08/13/2021   Feeding difficulties 08/12/2021   Feeding tube dysfunction, initial encounter 08/12/2021   Posterior tibial tendon dysfunction, right 04/15/2021   Posterior tibial tendinitis, right leg    Malignant neoplasm of prostate ([place]) 02/11/2020   Orthostatic tremor 10/01/2019   Educated about COVID-19 virus infection 09/12/2019   Dyslipidemia 09/12/2019   Nonrheumatic pulmonary valve insufficiency 09/12/2019   Red blood cell antibody positive, compatible PRBC difficult to obtain 02/15/2018   Diplopia 01/03/2018   Benign essential tremor 01/03/2018   Pain in joint of right ankle 05/17/2017   Foot pain 05/17/2017   Peroneal tendinitis 05/17/2017   Depression with anxiety 01/03/2017   Insomnia 05/05/2016   Coronary artery calcification seen on CAT scan    DOE (dyspnea on exertion)    Fatigue 02/27/2015   Elevated homocysteine 09/26/2014   Multiple myeloma ([place])  09/26/2014   Acquired pes planus of right foot 07/02/2014   Posterior tibial tendon dysfunction 07/02/2014   Encounter for antineoplastic chemotherapy 01/02/2014   Kahler disease ([place]) 01/02/2014   Avitaminosis D 01/02/2014   Amyloidosis ([place]) 03/26/2013   Enlarged lymph node 02/14/2013   Pure hypercholesterolemia 03/29/2011   Memory disorder 09/21/2010   Labile hypertension 09/21/2010    Orientation RESPIRATION BLADDER Height & Weight     Self, Time, Situation, Place    External catheter Weight: 124 lb 9 oz (56.5 kg) Height:  '5\' 6"'  (167.6 cm)  BEHAVIORAL SYMPTOMS/MOOD NEUROLOGICAL BOWEL NUTRITION STATUS        Diet (See DC Summary)  AMBULATORY STATUS COMMUNICATION OF NEEDS Skin     Verbally Normal                       Personal Care Assistance Level of Assistance  Bathing, Feeding, Dressing Bathing Assistance: Maximum assistance Feeding assistance: Independent Dressing Assistance: Maximum assistance     Functional Limitations Info  Sight, Hearing, Speech Sight Info: Impaired Hearing Info: Impaired Speech Info: Adequate    SPECIAL CARE FACTORS FREQUENCY  PT (By licensed PT), OT (By licensed OT)     PT Frequency: 5x a week OT Frequency: 5x a week            Contractures Contractures Info: Not present    Additional Factors Info  Code Status, Allergies, Suctioning Needs Code Status Info: Full Allergies Info: Penicillins       Suctioning Needs: GJ tube intermittent suction   Current Medications (08/13/2021):  This is  the current hospital active medication list Current Facility-Administered Medications  Medication Dose Route Frequency Provider Last Rate Last Admin   chlorhexidine (PERIDEX) 0.12 % solution 15 mL  15 mL Mouth Rinse BID Elodia Florence., MD   15 mL at 08/13/21 1318   enoxaparin (LOVENOX) injection 40 mg  40 mg Subcutaneous Q24H Wynetta Fines T, MD   40 mg at 08/13/21 1437   feeding supplement (GLUCERNA 1.5 CAL) liquid 1,000 mL  1,000 mL  Per Tube Continuous Elodia Florence., MD 65 mL/hr at 08/13/21 1318 1,000 mL at 08/13/21 1318   fluconazole (DIFLUCAN) tablet 200 mg  200 mg Oral Daily Gareth Morgan, MD   200 mg at 08/13/21 1022   gabapentin (NEURONTIN) 250 MG/5ML solution 100 mg  100 mg Per Tube Q8H Elodia Florence., MD       insulin aspart (novoLOG) injection 0-15 Units  0-15 Units Subcutaneous TID WC Lequita Halt, MD   5 Units at 08/13/21 1316   insulin glargine-yfgn (SEMGLEE) injection 6 Units  6 Units Subcutaneous Daily Gareth Morgan, MD   6 Units at 08/13/21 1316   MEDLINE mouth rinse  15 mL Mouth Rinse q12n4p Elodia Florence., MD   15 mL at 08/13/21 1318   melatonin tablet 6 mg  6 mg Per Tube QHS Wynetta Fines T, MD   6 mg at 08/12/21 2211   metoprolol tartrate (LOPRESSOR) tablet 12.5 mg  12.5 mg Per J Tube BID Wynetta Fines T, MD   12.5 mg at 08/13/21 1021   oxyCODONE (Oxy IR/ROXICODONE) immediate release tablet 2.5 mg  2.5 mg Per Tube Q4H PRN Gareth Morgan, MD   2.5 mg at 08/12/21 0502   pantoprazole sodium (PROTONIX) 40 mg/20 mL oral suspension 40 mg  40 mg Per Tube Daily Gareth Morgan, MD   40 mg at 08/13/21 1023   phenol (CHLORASEPTIC) mouth spray 1 spray  1 spray Mouth/Throat PRN Gareth Morgan, MD       traZODone (DESYREL) tablet 50 mg  50 mg Per Tube QHS PRN Lequita Halt, MD         Discharge Medications: Please see discharge summary for a list of discharge medications.  Relevant Imaging Results:  Relevant Lab Results:   Additional Information SSN: [id number].  Pt is vaccinated for covid with one booster.  Ark Agrusa Renold Don, LCSWA

## 2021-08-13 NOTE — Progress Notes (Signed)
Initial Nutrition Assessment  DOCUMENTATION CODES:  Non-severe (moderate) malnutrition in context of chronic illness  INTERVENTION:  Adjust TF to the following: Glucerna 1.5 @ 60mL/h (1560mL/d) Free water flush of 140mL q4h per MD This provides 2340kcal, 129g of protein, and 2092mL of free water (TF+flush)  NUTRITION DIAGNOSIS:  Severe Malnutrition (in the context of chronic illness) related to cancer and cancer related treatments (prostate in 2022 s/p radiation, amyloidosis and multiple myeloma) as evidenced by severe fat depletion, severe muscle depletion, percent weight loss (11% x 6 months).  GOAL:  Patient will meet greater than or equal to 90% of their needs  MONITOR:  TF tolerance, Weight trends  REASON FOR ASSESSMENT:  Consult Enteral/tube feeding initiation and management  ASSESSMENT:  Pt with extensive medical hx (HLD, HTN, CAD, amyloidosis and multiple myeloma, GERD, hx prostate cancer, DM type 2) presented to ED from a nursing facility after recent long hospitalization at [name].  Reviewed chart and recent events that led pt to this hospitalization. In April, a pancreatic lesion was seen on imaging and due to pt's strong family hx of pancreatic cancer, the decision was made for pt to undergo a pancreatic resection. Pt underwent Whipple 5/1 and had placement of G/J tube. Pt was then discharged to [facility] but immediately sent to ED from facility as they stated they were not told about all of his medical needs and could not manage him at their facility.  Reviewed RD notes from recent [name] admission. It appears that pt received TPN initially after Whipple and then eventually transitioned to TF via his J port with G port to gravity. At discharge, pt was tolerating feeds of Nutren 1.5 @ 60mL/h which provided 2160kcal and 98g of protein.  Met with pt and wife at bedside. Pt reports that weight has been stable around 140 lbs prior to [name] hospitalization. Prior to surgery, had a  good appetite and was eating at his baseline.   Upon assessment, pt appears quite frail with significant muscle and fat depletions. Wife reports a decline over the last month since his surgery and an increase in weakness. Will modify current order to provide additional kcal and protein of pt's increased needs from recovery from surgery, cancer, and severe malnutrition.    G-tube output: 150mL over the last 24 hours  Nutritionally Relevant Medications: Scheduled Meds:  insulin aspart  0-15 Units Subcutaneous TID WC   insulin glargine-yfgn  6 Units Subcutaneous Daily   pantoprazole sodium  40 mg Per Tube Daily   Continuous Infusions:  feeding supplement (GLUCERNA 1.5 CAL) 1,000 mL (08/13/21 1318)   Labs Reviewed: Sodium 132 CBG ranges from 84-230 mg/dL over the last 24 hours HgbA1c 7.8% (1/27)  NUTRITION - FOCUSED PHYSICAL EXAM: Flowsheet Row Most Recent Value  Orbital Region Severe depletion  Upper Arm Region Moderate depletion  Thoracic and Lumbar Region Severe depletion  Buccal Region Severe depletion  Temple Region Moderate depletion  Clavicle Bone Region Severe depletion  Clavicle and Acromion Bone Region Severe depletion  Scapular Bone Region Severe depletion  Dorsal Hand Moderate depletion  Patellar Region Severe depletion  Anterior Thigh Region Severe depletion  Posterior Calf Region Severe depletion  Edema (RD Assessment) None  Hair Reviewed  Eyes Reviewed  Mouth Reviewed  Skin Reviewed  Nails Reviewed   Diet Order:   Diet Order             Diet NPO time specified  Diet effective now                     EDUCATION NEEDS:  Education needs have been addressed  Skin:  Skin Assessment: Reviewed RN Assessment  Last BM:  unsure, prior to admission  Height:  Ht Readings from Last 1 Encounters:  08/12/21 5' 6" (1.676 m)    Weight:  Wt Readings from Last 1 Encounters:  08/13/21 56.5 kg    Ideal Body Weight:  64.5 kg  BMI:  Body mass index is 20.1  kg/m.  Estimated Nutritional Needs:  Kcal:  2200-2500 kcal/d Protein:  110-125 g/d Fluid:  2.2-2.5L/d     , RD, LDN Clinical Dietitian RD pager # available in AMION  After hours/weekend pager # available in AMION 

## 2021-08-13 NOTE — Assessment & Plan Note (Addendum)
Seems like some confusion regarding need for suction with the GJ -> ultimately led to him being admitted here He's s/p open whipple on 5/1 (care everywhere documentation confusing regarding dates of surgery) Post op course complicated by delirium, UTI, recurrent/continued need for NG placement GJ placed on 5/18 At the time of discharge, had continuous feeds via j port and g port was placed gravity mostly (occasionally placed to suction to alleviate nausea and drain stomach) Clear liquids for comfort (pt son had talked to Dr. Hyman Hopes regarding this) Per dc instructions, ok to hook g port to suction of patient bc distended, nauseated or with significant hiccups, should be on continuous j tube feeds. -Patient medically stable for discharge to CIR.

## 2021-08-13 NOTE — TOC Initial Note (Addendum)
Transition of Care (TOC) - Initial/Assessment Note    Patient Details  Name: William Barron MRN: [id number] Date of Birth: 06/02/1945  Transition of Care (TOC) CM/SW Contact:     M , LCSWA Phone Number: 08/13/2021, 3:33 PM  Clinical Narrative:                 CSW received SNF consult. CSW met with spouse. CSW introduced self and explained role at the hospital. Pt reports that PTA the pt was at [name] and immediately sent over to the ED. Pt was at [name] hospital for a few weeks and sent to [name], but the SNF did not think they could manage care at the time nor did they have pt meds. CSW followed up with Whitney at [facility] who shared that they do not have any beds at the moment but may have something on Monday. Pt is medically ready for DC but family only wants certain facilitates, ([name], [facility], [facility], [facility], and the new [facility] .  Pt spouse states that she cannot  care for pt at home and that her son is flying in today and will be here at 5pm to handle placement for SNF, possibly long term placement.   Pt gave CSW permission to fax out to facilities in the area. Pt spouse asked that pt does not go to [facility], [facility], or [facility]. The family only wants 5 star facilities. Pt spouse asks that CSW speak with pt son in the morning about DC plan.  CSW will continue to follow.    Expected Discharge Plan: Skilled Nursing Facility Barriers to Discharge: Continued Medical Work up   Patient Goals and CMS Choice     Choice offered to / list presented to : Patient, Spouse  Expected Discharge Plan and Services Expected Discharge Plan: Skilled Nursing Facility   Discharge Planning Services: CM Consult Post Acute Care Choice: Long Term Acute Care (LTAC), Skilled Nursing Facility Living arrangements for the past 2 months: Single Family Home                   DME Agency: NA       HH Arranged: NA          Prior Living  Arrangements/Services Living arrangements for the past 2 months: Single Family Home Lives with:: Spouse                   Activities of Daily Living   ADL Screening (condition at time of admission) Patient's cognitive ability adequate to safely complete daily activities?: Yes Is the patient deaf or have difficulty hearing?: Yes Does the patient have difficulty seeing, even when wearing glasses/contacts?: No Does the patient have difficulty concentrating, remembering, or making decisions?: No Patient able to express need for assistance with ADLs?: Yes Does the patient have difficulty dressing or bathing?: Yes Independently performs ADLs?: No Communication: Independent Dressing (OT): Needs assistance Is this a change from baseline?: Pre-admission baseline Grooming: Needs assistance Is this a change from baseline?: Pre-admission baseline Feeding: Independent Bathing: Needs assistance Is this a change from baseline?: Pre-admission baseline Toileting: Needs assistance Is this a change from baseline?: Pre-admission baseline In/Out Bed: Needs assistance Is this a change from baseline?: Pre-admission baseline Walks in Home: Needs assistance Is this a change from baseline?: Pre-admission baseline Does the patient have difficulty walking or climbing stairs?: Yes Weakness of Legs: Both Weakness of Arms/Hands: Both  Permission Sought/Granted                    Emotional Assessment              Admission diagnosis:  Jejunostomy tube present (HCC) [Z93.4] Feeding tube dysfunction, initial encounter [T85.598A] Gastrointestinal tube present (HCC) [Z93.1] History of Whipple procedure [Z90.410, Z90.49] Rehabilitation services declined [Z53.20] H/O Whipple procedure [Z90.410, Z90.49] Patient Active Problem List   Diagnosis Date Noted   H/O Whipple procedure 08/13/2021   Intractable hiccups 08/13/2021   Candida esophagitis (HCC) 08/13/2021   Diabetes (HCC) 08/13/2021    Anemia 08/13/2021   Hypoalbuminemia 08/13/2021   Frequent PVCs 08/13/2021   Prolonged QT interval 08/13/2021   Facial droop 08/13/2021   Feeding difficulties 08/12/2021   Feeding tube dysfunction, initial encounter 08/12/2021   Posterior tibial tendon dysfunction, right 04/15/2021   Posterior tibial tendinitis, right leg    Malignant neoplasm of prostate (HCC) 02/11/2020   Orthostatic tremor 10/01/2019   Educated about COVID-19 virus infection 09/12/2019   Dyslipidemia 09/12/2019   Nonrheumatic pulmonary valve insufficiency 09/12/2019   Red blood cell antibody positive, compatible PRBC difficult to obtain 02/15/2018   Diplopia 01/03/2018   Benign essential tremor 01/03/2018   Pain in joint of right ankle 05/17/2017   Foot pain 05/17/2017   Peroneal tendinitis 05/17/2017   Depression with anxiety 01/03/2017   Insomnia 05/05/2016   Coronary artery calcification seen on CAT scan    DOE (dyspnea on exertion)    Fatigue 02/27/2015   Elevated homocysteine 09/26/2014   Multiple myeloma (HCC) 09/26/2014   Acquired pes planus of right foot 07/02/2014   Posterior tibial tendon dysfunction 07/02/2014   Encounter for antineoplastic chemotherapy 01/02/2014   Kahler disease (HCC) 01/02/2014   Avitaminosis D 01/02/2014   Amyloidosis (HCC) 03/26/2013   Enlarged lymph node 02/14/2013   Pure hypercholesterolemia 03/29/2011   Memory disorder 09/21/2010   Labile hypertension 09/21/2010   PCP:  Holwerda, Scott, MD Pharmacy:   [facility] - [place], [place] - [address]  [place] [address] Phone: [phone] Fax: [phone]     Social Determinants of Health (SDOH) Interventions    Readmission Risk Interventions     View : No data to display.           

## 2021-08-13 NOTE — Assessment & Plan Note (Addendum)
Per Dr. Abel Presto, discussion with William Barron from [facility], intractable hiccups were part of the reason he came to the ED at [facility] Thorazine discontinued and patient placed on a trial gabapentin. -Hiccups improved. Follow, as above, can hook g port to suction as needed for distension or hiccups. -Patient medically stable and discharged to CIR.

## 2021-08-13 NOTE — Assessment & Plan Note (Signed)
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

## 2021-08-13 NOTE — Assessment & Plan Note (Addendum)
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 -Outpatient follow-up. -Patient will be discharged to CIR.

## 2021-08-13 NOTE — Plan of Care (Signed)

## 2021-08-13 NOTE — Assessment & Plan Note (Addendum)
S/p radiation Outpatient follow-up.

## 2021-08-13 NOTE — TOC Initial Note (Signed)
Transition of Care [facility]) - Initial/Assessment Note    Patient Details  Name: William Barron MRN: [id number] Date of Birth: May 16, 1945  Transition of Care [facility]) CM/SW Contact:    Marilu Favre, RN Phone Number: 08/13/2021, 10:17 AM  Clinical Narrative:                 Spoke to patient and wife [name] at bedside. Per them patient was at [facility] for 30 days and had a whipple. [name] sent him to [facility] , per patient and wife [name] did not have his medications ready and were unaware he has tube.   Patient's family has been talking to [place] at [facility] NCM spoke to [facility] , [place] requires 3 three midnight stay in ICU on this admission . Family asked Raquel Sarna to discuss with her supervisors . Raquel Sarna waiting to hear back.    Patient and wife wanted to know if there is another LTAC in [place]. NCM discussed [facility], they would like a referral to [facility]. Spoke to [place] at [facility]	[place] , again told medicare requires a three midnight ICU stay , so not appropriate for LTAC .  [place] called back patient does not meet criteria for LTAC admission due to not having three ICU midnight stay this admission.   Patient and wife aware.  SW will follow up for SNF options     Barriers to Discharge: Continued Medical Work up   Patient Goals and CMS Choice     Choice offered to / list presented to : Patient, Spouse  Expected Discharge Plan and Services Expected Discharge Plan: [place]   Discharge Planning Services: CM Consult Post Acute Care Choice: Long Term Acute Care (LTAC), [place] Living arrangements for the past 2 months: Single Family Home                   DME Agency: NA       HH Arranged: NA          Prior Living Arrangements/Services Living arrangements for the past 2 months: Single Family Home Lives with:: Spouse                   Activities of Daily Living   ADL Screening (condition at time of admission) Patient's  cognitive ability adequate to safely complete daily activities?: Yes Is the patient deaf or have difficulty hearing?: Yes Does the patient have difficulty seeing, even when wearing glasses/contacts?: No Does the patient have difficulty concentrating, remembering, or making decisions?: No Patient able to express need for assistance with ADLs?: Yes Does the patient have difficulty dressing or bathing?: Yes Independently performs ADLs?: No Communication: Independent Dressing (OT): Needs assistance Is this a change from baseline?: Pre-admission baseline Grooming: Needs assistance Is this a change from baseline?: Pre-admission baseline Feeding: Independent Bathing: Needs assistance Is this a change from baseline?: Pre-admission baseline Toileting: Needs assistance Is this a change from baseline?: Pre-admission baseline In/Out Bed: Needs assistance Is this a change from baseline?: Pre-admission baseline Walks in Home: Needs assistance Is this a change from baseline?: Pre-admission baseline Does the patient have difficulty walking or climbing stairs?: Yes Weakness of Legs: Both Weakness of Arms/Hands: Both  Permission Sought/Granted                  Emotional Assessment              Admission diagnosis:  Jejunostomy tube present ([place]) [Z93.4] Feeding tube dysfunction, initial encounter [T85.598A] Gastrointestinal tube  present [facility]) [Z93.1] History of Whipple procedure [Z90.410, Z90.49] Rehabilitation services declined [Q42.10] Patient Active Problem List   Diagnosis Date Noted   Feeding difficulties 08/12/2021   Feeding tube dysfunction, initial encounter 08/12/2021   Posterior tibial tendon dysfunction, right 04/15/2021   Posterior tibial tendinitis, right leg    Malignant neoplasm of prostate ([place]) 02/11/2020   Orthostatic tremor 10/01/2019   Educated about COVID-19 virus infection 09/12/2019   Dyslipidemia 09/12/2019   Nonrheumatic pulmonary valve insufficiency  09/12/2019   Red blood cell antibody positive, compatible PRBC difficult to obtain 02/15/2018   Diplopia 01/03/2018   Benign essential tremor 01/03/2018   Pain in joint of right ankle 05/17/2017   Foot pain 05/17/2017   Peroneal tendinitis 05/17/2017   Depression with anxiety 01/03/2017   Insomnia 05/05/2016   Coronary artery calcification seen on CAT scan    DOE (dyspnea on exertion)    Fatigue 02/27/2015   Elevated homocysteine 09/26/2014   Multiple myeloma ([place]) 09/26/2014   Acquired pes planus of right foot 07/02/2014   Posterior tibial tendon dysfunction 07/02/2014   Encounter for antineoplastic chemotherapy 01/02/2014   Kahler disease ([place]) 01/02/2014   Avitaminosis D 01/02/2014   Amyloidosis ([place]) 03/26/2013   Enlarged lymph node 02/14/2013   Pure hypercholesterolemia 03/29/2011   Memory disorder 09/21/2010   Labile hypertension 09/21/2010   PCP:  [name], MD Pharmacy:   [name], [place] [address] Phone: [phone] Fax: [phone]     Social Determinants of Health (SDOH) Interventions    Readmission Risk Interventions     View : No data to display.

## 2021-08-13 NOTE — Assessment & Plan Note (Addendum)
Patient maintained on semglee 6 units with SSI q4. -Patient to be discharged to inpatient rehab

## 2021-08-14 ENCOUNTER — Other Ambulatory Visit: Payer: Self-pay

## 2021-08-14 ENCOUNTER — Encounter (HOSPITAL_COMMUNITY): Payer: Self-pay | Admitting: Family Medicine

## 2021-08-14 DIAGNOSIS — Z90410 Acquired total absence of pancreas: Secondary | ICD-10-CM | POA: Diagnosis not present

## 2021-08-14 DIAGNOSIS — Z9049 Acquired absence of other specified parts of digestive tract: Secondary | ICD-10-CM | POA: Diagnosis not present

## 2021-08-14 LAB — GLUCOSE, CAPILLARY
Glucose-Capillary: 114 mg/dL — ABNORMAL HIGH (ref 70–99)
Glucose-Capillary: 156 mg/dL — ABNORMAL HIGH (ref 70–99)
Glucose-Capillary: 174 mg/dL — ABNORMAL HIGH (ref 70–99)
Glucose-Capillary: 179 mg/dL — ABNORMAL HIGH (ref 70–99)
Glucose-Capillary: 182 mg/dL — ABNORMAL HIGH (ref 70–99)
Glucose-Capillary: 231 mg/dL — ABNORMAL HIGH (ref 70–99)
Glucose-Capillary: 231 mg/dL — ABNORMAL HIGH (ref 70–99)

## 2021-08-14 MED ORDER — FLUCONAZOLE 200 MG PO TABS: 200.0000 mg | ORAL_TABLET | Freq: Every day | ORAL | Status: DC

## 2021-08-14 MED ORDER — FLUCONAZOLE 200 MG PO TABS
200.0000 mg | ORAL_TABLET | Freq: Every day | ORAL | Status: DC
  Administered 2021-08-14 – 2021-08-19 (×6): 200 mg
  Filled 2021-08-14 (×6): qty 1

## 2021-08-14 NOTE — Plan of Care (Signed)
  Problem: Education: Goal: Knowledge of General Education information will improve Description: Including pain rating scale, medication(s)/side effects and non-pharmacologic comfort measures Outcome: Progressing   Problem: Clinical Measurements: Goal: Ability to maintain clinical measurements within normal limits will improve Outcome: Progressing Goal: Will remain free from infection Outcome: Progressing Goal: Diagnostic test results will improve Outcome: Progressing Goal: Cardiovascular complication will be avoided Outcome: Progressing   

## 2021-08-14 NOTE — TOC Progression Note (Addendum)
Transition of Care [facility]) - Progression Note    Patient Details  Name: William Barron MRN: [id number] Date of Birth: 25-Apr-1945  Transition of Care [facility]) CM/SW Contact  Reece Agar, [place] Phone Number: 08/14/2021, 11:44 AM  Clinical Narrative:    CSW spoke with pt son about PT recommending CIR. Pt son would like for pt to have placement before he has to go back to [place] on Tuesday. Pt son would like to be contacted first to make decisions on dc placement.   CIR following.  CSW spoke with pt son, he is wanting pt to go to CIR but would like him to go  as early as possible. CSW explained that there are no beds at this time. Pt son also would like [facility] and for pt to get therapies 2-3 hours a day. CSW contacted [name] to confirm a bed on Monday, no answer. CSW will continue to follow.    Expected Discharge Plan: [place] Barriers to Discharge: Continued Medical Work up  Expected Discharge Plan and Services Expected Discharge Plan: [place]   Discharge Planning Services: CM Consult Post Acute Care Choice: Long Term Acute Care (LTAC), [place] Living arrangements for the past 2 months: Single Family Home                   DME Agency: NA       HH Arranged: NA           Social Determinants of Health (SDOH) Interventions    Readmission Risk Interventions     View : No data to display.

## 2021-08-14 NOTE — Progress Notes (Addendum)
   Inpatient Rehab Admissions Coordinator :  Per therapy recommendations, patient was screened for CIR candidacy by Danne Baxter RN MSN.  At this time patient appears to be a potential candidate for CIR. I will place a rehab consult per protocol for full assessment.  If deemed a candidate, CIR bed not immediately available therefore other rehab venues would also need to be pursued. I have alerted Dr Florene Glen and TOC of this.  Danne Baxter RN MSN Admissions Coordinator [phone]

## 2021-08-14 NOTE — Evaluation (Signed)
Physical Therapy Evaluation Patient Details Name: William Barron MRN: [id number] DOB: Dec 03, 1945 Today's Date: 08/14/2021  History of Present Illness  Pt is a 76 yr old M admitted on 08/11/21 from SNF.  Pt has been at [facility] since having Whipple on 5/1, transferred to SNF who immediately reported inability to care for pt.  Imaging (+) for R parotid gland lesion, suspicious of neoplasm.  PMH: DM, multiple myeloma, tremors, prostate CA, HTN  Clinical Impression  Pt has been mod I with bed mobility and mod A for transfers with use of RW during the last month at [facility].  Prior to that, was not mobilizing much due to a recent foot surgery, but was able to ambulate short distance and transfer without assist.  Currently, pt appears to functionally be where he was when he left [name], requiring mod A for transfers with use of mod A.  Pt demos dec LE strength, dec balance, transfer difficulty and dec activity tolerance and would benefit from skilled PT in acute care to address deficits and work towards increasing ambulation distance and safety.  Pt would benefit from AIR placement prior to return home.       Recommendations for follow up therapy are one component of a multi-disciplinary discharge planning process, led by the attending physician.  Recommendations may be updated based on patient status, additional functional criteria and insurance authorization.  Follow Up Recommendations Acute inpatient rehab (3hours/day)    Assistance Recommended at Discharge Frequent or constant Supervision/Assistance  Patient can return home with the following  A lot of help with walking and/or transfers;A lot of help with bathing/dressing/bathroom;Assist for transportation;Assistance with cooking/housework;Help with stairs or ramp for entrance    Equipment Recommendations Rolling walker (2 wheels);BSC/3in1  Recommendations for Other Services       Functional Status Assessment Patient has had a recent decline in their  functional status and demonstrates the ability to make significant improvements in function in a reasonable and predictable amount of time.     Precautions / Restrictions Precautions Precautions: Fall Precaution Comments: GJ tube Restrictions Weight Bearing Restrictions: No      Mobility  Bed Mobility Overal bed mobility: Modified Independent Bed Mobility: Sit to Supine       Sit to supine: Modified independent (Device/Increase time)   General bed mobility comments: Pt able to side scoot on EOB without assist.  Transfers to supine with mod I, taking inc time to complete activity.  Uses UEs to assist LEs up onto bed.  PT provides min A only to help position pt in bed as he is initially crooked. Patient Response: Cooperative  Transfers Overall transfer level: Needs assistance Equipment used: Rolling walker (2 wheels) Transfers: Sit to/from Stand Sit to Stand: Min assist   Step pivot transfers: Mod assist       General transfer comment: Pt performs sit > stand with min A and use of RW.  VCs to push up from chair.  Pt has postural tremors and begins to shake when upright.  States he has had this for the last 5-6 years.  Pt req's mod A for safety to take steps from chair over to bed.    Ambulation/Gait                  Stairs            Wheelchair Mobility    Modified Rankin (Stroke Patients Only)       Balance Overall balance assessment: Needs assistance  Standing balance-Leahy Scale: Fair                               Pertinent Vitals/Pain Pain Assessment Pain Assessment: 0-10    Home Living Family/patient expects to be discharged to:: Other (Comment) (Was home with spouse prior to surgery on 5/1.  Has been at [facility] since procedure.) Living Arrangements: Spouse/significant other Available Help at Discharge: Family (States that spouse can help him "a little") Type of Home: House Home Access: Stairs to enter Entrance  Stairs-Rails: Right Entrance Stairs-Number of Steps: 4-5 Alternate Level Stairs-Number of Steps: Flight (Has stair lift to ascend stairs) Home Layout: Two level Home Equipment: None Additional Comments: Has been using a RW at [facility] for transfers    Prior Function Prior Level of Function : Needs assist             Mobility Comments: Pt reports he was weak prior to whipple secondary to foot operation 2 months prior limiting mobility.  Since whipple, has been able to transfer to EOB with mod I and perform sit > stand and transfer with RW and "a lot" of assist per pt.  Has not been ambulating. ADLs Comments: Pt has been having assist with ADLs for last month     Hand Dominance        Extremity/Trunk Assessment   Upper Extremity Assessment Upper Extremity Assessment: Defer to OT evaluation    Lower Extremity Assessment Lower Extremity Assessment: Generalized weakness    Cervical / Trunk Assessment Cervical / Trunk Assessment: Kyphotic (minimally)  Communication   Communication: No difficulties  Cognition Arousal/Alertness: Awake/alert Behavior During Therapy: WFL for tasks assessed/performed Overall Cognitive Status: Within Functional Limits for tasks assessed                                 General Comments: Pt is sitting up in chair when PT arrives.  Alert and oriented x 2. Agreeable to work with.  Is requesting to return back to bed due to fatigue.        General Comments General comments (skin integrity, edema, etc.): HR to 115 with transfer, otherwise VSS on RA    Exercises     Assessment/Plan    PT Assessment Patient needs continued PT services  PT Problem List Decreased strength;Decreased mobility;Decreased activity tolerance;Decreased balance       PT Treatment Interventions DME instruction;Therapeutic exercise;Gait training;Balance training;Functional mobility training;Therapeutic activities;Patient/family education    PT Goals (Current  goals can be found in the Care Plan section)  Acute Rehab PT Goals Patient Stated Goal: Pt's goal is to be able to walk PT Goal Formulation: With patient Time For Goal Achievement: 08/28/21 Potential to Achieve Goals: Good    Frequency Min 3X/week     Co-evaluation               AM-PAC PT "6 Clicks" Mobility  Outcome Measure Help needed turning from your back to your side while in a flat bed without using bedrails?: A Little Help needed moving from lying on your back to sitting on the side of a flat bed without using bedrails?: A Little Help needed moving to and from a bed to a chair (including a wheelchair)?: A Lot Help needed standing up from a chair using your arms (e.g., wheelchair or bedside chair)?: A Little Help needed to walk in hospital room?: A Lot  Help needed climbing 3-5 steps with a railing? : A Lot 6 Click Score: 15    End of Session Equipment Utilized During Treatment: Gait belt Activity Tolerance: Patient tolerated treatment well Patient left: in bed;with call bell/phone within reach;with bed alarm set Nurse Communication: Mobility status PT Visit Diagnosis: Unsteadiness on feet (R26.81)    Time: 9136-8599 PT Time Calculation (min) (ACUTE ONLY): 21 min   Charges:   PT Evaluation $PT Eval Low Complexity: 1 Low          Ginna Schuur A. Shanielle Correll, PT, DPT Acute Rehabilitation Services Office: [place] 08/14/2021, 11:24 AM

## 2021-08-14 NOTE — Progress Notes (Signed)
PROGRESS NOTE    William Barron  VQQ:[id number] DOB: 1945/12/29 DOA: 08/11/2021 PCP: [name], MD  Chief Complaint  Patient presents with   Needs SNF placement    Brief Narrative:  William Barron is William Barron 76 y.o. male with medical history significant of pancreatic mass status post Whipple and J/G tube April-May 2023, multiple myeloma, prostate cancer status post radiation therapy, HTN, HLD, CAD, amyloidosis, IDDM who was recently discharged from [name] 5/30 after prolonged hospital stay after whipple procedure.  According to [name]'s documentation, patient has strong family history of pancreatic cancer, decision was made for resection of pancreas.  Whipple was performed and postop pathology extensive involvement by amyloidosis (no definite evidence of IPMN or mucinous cystic neoplasm component is identified) (07/13/2021 pathology - previous note had mentioned pathology showing IPMN, but I don't see this). After surgery, patient was on TPN briefly then JG tube inserted by [name] services. He arrived at [facility] on 5/30 and they were under the impression they could not handle his GJ.  They did not receive his discharge summary until he was en route, per my discussion with thme.  It sounds like they thought he needed continuous suction (though per discharge summary, it was noted that g port could be placed to suction for nausea as needed, but it was mostly to gravity).  They initially asked to go back to [name], but EMS wouldn't take them there.  Due to his continued intractable hiccups and need for care and the belief they were unable to manage him at [facility], he came to the [facility] ED.      Assessment & Plan:   Principal Problem:   H/O Whipple procedure Active Problems:   Intractable hiccups   Candida esophagitis (HCC)   Facial droop   Diabetes (HCC)   Anemia   Hypoalbuminemia   Frequent PVCs   Prolonged QT interval   Amyloidosis (HCC)   Multiple myeloma (HCC)   Prostate cancer (HCC)    Hormone replacement therapy   Lesion of parotid gland   Protein-calorie malnutrition, severe (HCC)   Feeding difficulties   Feeding tube dysfunction, initial encounter   Assessment and Plan: * H/O Whipple procedure Seems like some confusion regarding need for suction with the GJ -> ultimately led to him being admitted here He's s/p open whipple on 5/1 (care everywhere documentation confusing regarding dates of surgery) Post op course complicated by delirium, UTI, recurrent/continued need for NG placement GJ placed on 5/18 At the time of discharge, had continuous feeds via j port and g port was placed gravity mostly (occasionally placed to suction to alleviate nausea and drain stomach) Clear liquids for comfort (pt son had talked to Dr. Hyman Hopes regarding this) Per dc instructions, ok to hook g port to suction of patient bc distended, nauseated or with significant hiccups, should be on continuous j tube feeds    Intractable hiccups Per my discussion with William Barron from [place], intractable hiccups were part of the reason he came [facility] the ED [facility] Will stop thorazine and trial gabapentin  Follow, as above, can hook g port to suction as needed for distension or hiccups  Facial droop Mild L facial droop, he'd complained of confusion last night and was concerned he might be having William Barron stroke.  May have been related to thorazine? 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 Surgical pathology from 5/1 with hepatic artery LN involved by amyloid,  gallbladder involved by amyloid, pancreas, portion of stomach, and duodenum with extensive involvement by amyloidosis, 13 lymph nodes involved by amyloidosis, negative for tumor (see report in care everywhere)  Multiple myeloma ([place]) ixazomib is on d/c meds from [name] Also on 4 mg dexamethazone once weekly  Holding at this time, will follow with pt/wife  Prostate cancer [facility]) S/p radiation Will follow with pt/wife  Hormone replacement therapy Testosterone is on his d/c meds from [facility] for now  Lesion of parotid gland Needs ENT follow up outpatient  Concerning for parotid neoplasm  Protein-calorie malnutrition, severe ([place]) Tube feeds RD c/s      DVT prophylaxis: lovenox Code Status: full Family Communication: wife Disposition:   Status is: Inpatient Remains inpatient appropriate because: pending safe discharge plan - stable for discharge - looking into CIR, etc   Consultants:  none  Procedures:  none  Antimicrobials:  Anti-infectives (From admission, onward)    Start     Dose/Rate Route Frequency Ordered Stop   08/15/21 1000  fluconazole (DIFLUCAN) tablet 200 mg  Status:  Discontinued        200 mg Per Tube Daily 08/14/21 1106 08/14/21 1230   08/14/21 1230  fluconazole (DIFLUCAN) tablet 200 mg        200 mg Per Tube Daily 08/14/21 1230 08/21/21 0959   08/12/21 1348  fluconazole (DIFLUCAN) 40 MG/ML suspension 200 mg  Status:  Discontinued       Note to Pharmacy: Qd x 10 days     200 mg Per Tube See admin instructions 08/12/21 1352 08/12/21 1354   08/12/21 1000  fluconazole (DIFLUCAN) tablet 200 mg  Status:  Discontinued        200 mg Oral Daily 08/12/21 0152 08/14/21 1106       Subjective: No new complaints Questions regarding placement mostly   Objective: Vitals:   08/13/21 1551 08/13/21 1956 08/14/21 0600 08/14/21 0746  BP: (!) 98/53 (!) 101/53  (!) 103/57  Pulse: 80 88  84  Resp: '19 16  16  ' Temp: 98 F (36.7 C) 99.1 F (37.3 C)  97.7 F (36.5  C)  TempSrc: Oral Oral  Oral  SpO2: 99% 99%  98%  Weight:   54.2 kg   Height:        Intake/Output Summary (Last 24 hours) at 08/14/2021 1404 Last data filed at 08/14/2021 0700 Gross per 24 hour  Intake 960.5 ml  Output 800 ml  Net 160.5 ml   Filed Weights   08/12/21 1400 08/13/21 0559 08/14/21 0600  Weight: 63.5 kg 56.5 kg 54.2 kg    Examination:  General: No acute distress. Cardiovascular: RRR Lungs: unlabored Abdomen: GJ tube Neurological: Alert and oriented 3. Moves all extremities 4 with equal strength. Cranial nerves II through XII grossly intact. Extremities: No clubbing or cyanosis. No edema.    Data Reviewed: I have personally reviewed following labs and imaging studies  CBC: Recent Labs  Lab 08/11/21 1900  WBC 8.4  NEUTROABS 7.0  HGB 8.1*  HCT 26.8*  MCV 90.5  PLT 422*    Basic Metabolic Panel: Recent Labs  Lab 08/11/21 1900 08/12/21 1921 08/13/21 0604  NA 135  --  132*  K 3.9  --  4.6  CL 107  --  99  CO2 20*  --  25  GLUCOSE 112*  --  215*  BUN 15  --  15  CREATININE 0.71  --  0.95  CALCIUM 7.5*  --  8.5*  MG  --  2.0  --     GFR: Estimated Creatinine Clearance: 49.1 mL/min (by C-G formula based on SCr of 0.95 mg/dL).  Liver Function Tests: Recent Labs  Lab 08/11/21 1900  AST 24  ALT 27  ALKPHOS 406*  BILITOT 0.1*  PROT 5.1*  ALBUMIN 2.1*    CBG: Recent Labs  Lab 08/13/21 1958 08/14/21 0015 08/14/21 0624 08/14/21 0807 08/14/21 1125  GLUCAP 194* 174* 182* 231* 231*     Recent Results (from the past 240 hour(s))  Urine Culture     Status: Abnormal (Preliminary result)   Collection Time: 08/11/21  8:53 PM   Specimen: Urine, Clean Catch  Result Value Ref Range Status   Specimen Description URINE, CLEAN CATCH  Final   Special Requests NONE  Final   Culture (William Barron)  Final    70,000 COLONIES/mL KLEBSIELLA PNEUMONIAE Confirmed Extended Spectrum Beta-Lactamase Producer (ESBL).  In bloodstream infections from ESBL organisms,  carbapenems are preferred over piperacillin/tazobactam. They are shown to have William Barron lower risk of mortality. 20,000 COLONIES/mL CITROBACTER FREUNDII SUSCEPTIBILITIES TO FOLLOW Performed at [facility], [place] [address], [place], [place] [address]    Report Status PENDING  Incomplete   Organism ID, Bacteria KLEBSIELLA PNEUMONIAE (William Barron)  Final      Susceptibility   Klebsiella pneumoniae - MIC*    AMPICILLIN >=32 RESISTANT Resistant     CEFAZOLIN >=64 RESISTANT Resistant     CEFEPIME >=32 RESISTANT Resistant     CEFTRIAXONE >=64 RESISTANT Resistant     CIPROFLOXACIN 2 RESISTANT Resistant     GENTAMICIN >=16 RESISTANT Resistant     IMIPENEM 0.5 SENSITIVE Sensitive     NITROFURANTOIN 64 INTERMEDIATE Intermediate     TRIMETH/SULFA >=320 RESISTANT Resistant     AMPICILLIN/SULBACTAM >=32 RESISTANT Resistant     PIP/TAZO 16 SENSITIVE Sensitive     * 70,000 COLONIES/mL KLEBSIELLA PNEUMONIAE  MRSA Next Gen by PCR, Nasal     Status: None   Collection Time: 08/13/21 10:50 AM   Specimen: Nasal Mucosa; Nasal Swab  Result Value Ref Range Status   MRSA by PCR Next Gen NOT DETECTED NOT DETECTED Final    Comment: (NOTE) The GeneXpert MRSA Assay (FDA approved for NASAL specimens only), is one component of William Barron comprehensive MRSA colonization surveillance program. It is not intended to diagnose MRSA infection nor to guide or monitor treatment for MRSA infections. Test performance is not FDA approved in patients less than 21 years old. Performed at [facility], [place] [address], [place], [place] [address]          Radiology Studies: MR BRAIN WO CONTRAST  Result Date: 08/13/2021 CLINICAL DATA:  Neuro deficit, stroke suspected EXAM: MRI HEAD WITHOUT CONTRAST TECHNIQUE: Multiplanar, multiecho pulse sequences of the brain and surrounding structures were obtained without intravenous contrast. COMPARISON:  CT head 02/08/2021, MR head 01/16/2018 FINDINGS: Brain: There is no acute intracranial  hemorrhage, extra-axial fluid collection, or acute infarct There is mild global parenchymal volume loss with prominence of the ventricular system and extra-axial CSF spaces. The ventricles are stable in size since 2019. Patchy foci of  FLAIR signal abnormality throughout the subcortical and periventricular white matter are overall unchanged, nonspecific but likely reflecting sequela of chronic white matter microangiopathy. There is no suspicious parenchymal signal abnormality. There is no mass lesion. There is no mass effect or midline shift. Vascular: Normal flow voids. Skull and upper cervical spine: Normal marrow signal. Sinuses/Orbits: Paranasal sinuses are clear. The globes and orbits are unremarkable. Other: There is an ovoid lesion in the region of the right parotid gland measuring 1.4 cm x 1.9 cm (5-2, 10-10). This lesion was likely present on the study from 2019 but is increased in size IMPRESSION: 1. No acute intracranial pathology. 2. Mild parenchymal volume loss and chronic white matter microangiopathy. 3. 1.9 cm lesion in the region of the right parotid gland is increased in size since 2019 suspicious for parotid neoplasm. Recommend nonemergent ENT referral. Electronically Signed   By: Valetta Mole M.D.   On: 08/13/2021 12:40        Scheduled Meds:  chlorhexidine  15 mL Mouth Rinse BID   enoxaparin (LOVENOX) injection  40 mg Subcutaneous Q24H   fluconazole  200 mg Per Tube Daily   gabapentin  100 mg Per Tube Q8H   insulin aspart  0-9 Units Subcutaneous Q4H   insulin glargine-yfgn  6 Units Subcutaneous Daily   mouth rinse  15 mL Mouth Rinse q12n4p   melatonin  6 mg Per Tube QHS   metoprolol tartrate  12.5 mg Per J Tube BID   pantoprazole sodium  40 mg Per Tube Daily   Continuous Infusions:  feeding supplement (GLUCERNA 1.5 CAL) 1,000 mL (08/13/21 1318)     LOS: 1 day    Time spent: over 30 min    Fayrene Helper, MD [facility]   To contact the attending provider  between 7A-7P or the covering provider during after hours 7P-7A, please log into the web site [email or web address] and access using universal [place] password for that web site. If you do not have the password, please call the hospital operator.  08/14/2021, 2:04 PM

## 2021-08-14 NOTE — Evaluation (Signed)
Occupational Therapy Evaluation Patient Details Name: William Barron MRN: [id number] DOB: 07/02/1945 Today's Date: 08/14/2021   History of Present Illness Pt is a 76 yr old M admitted on 08/11/21 from SNF.  Pt has been at [facility] since having Whipple on 5/1, transferred to SNF who immediately reported inability to care for pt.  Imaging (+) for R parotid gland lesion, suspicious of neoplasm.  PMH: DM, multiple myeloma, tremors, prostate CA, HTN   Clinical Impression   Prior to this admission, patient at [facility] since 5/1 status post whipple, however prior to surgery was living independently with his wife. Son endorses foot surgery in March or April of 2023 that limited mobility and patient was requiring increased assist but progressing until Whipple. Currently, patient is presenting with significant decreased activity tolerance and weakness, bilateral tremors in legs when standing, and need for increased time to follow basic commands. Patient currently mod A for ADLs and min-mod A for basic transfers, requiring +2 for gait progression. Given patient's previous level of function, ability for rehab potential, and family support, OT recommending AIR consult and AIR placement when medically able to discharge. OT will continue to follow acutely.      Recommendations for follow up therapy are one component of a multi-disciplinary discharge planning process, led by the attending physician.  Recommendations may be updated based on patient status, additional functional criteria and insurance authorization.   Follow Up Recommendations  Acute inpatient rehab (3hours/day)    Assistance Recommended at Discharge Frequent or constant Supervision/Assistance  Patient can return home with the following A lot of help with walking and/or transfers;A lot of help with bathing/dressing/bathroom;Assistance with cooking/housework;Direct supervision/assist for medications management;Direct supervision/assist for financial  management;Assist for transportation;Help with stairs or ramp for entrance    Functional Status Assessment  Patient has had a recent decline in their functional status and demonstrates the ability to make significant improvements in function in a reasonable and predictable amount of time.  Equipment Recommendations  Other (comment) (Will continue to assess)    Recommendations for Other Services Rehab consult     Precautions / Restrictions Precautions Precautions: Other (comment) Precaution Comments: G tube, stomach drain status post whipple Restrictions Weight Bearing Restrictions: No      Mobility Bed Mobility Overal bed mobility: Needs Assistance Bed Mobility: Supine to Sit     Supine to sit: Min guard     General bed mobility comments: use of bed rails, otherwise able to complete    Transfers Overall transfer level: Needs assistance Equipment used: Rolling walker (2 wheels) Transfers: Sit to/from Stand, Bed to chair/wheelchair/BSC Sit to Stand: Min assist, Mod assist Stand pivot transfers: Min assist, Mod assist         General transfer comment: patient min-mod A with all transfers due to bilateral tremoring in legs, able to complete stand pivot to recliner with shuffled steps, max cues required to sequence sit<>stands, and walker management and hand placement      Balance Overall balance assessment: Needs assistance Sitting-balance support: Feet supported, Single extremity supported Sitting balance-Leahy Scale: Fair     Standing balance support: Bilateral upper extremity supported, During functional activity, Reliant on assistive device for balance Standing balance-Leahy Scale: Poor Standing balance comment: reliant on RW for support                           ADL either performed or assessed with clinical judgement   ADL Overall ADL's : Needs assistance/impaired  Eating/Feeding: Set up;Sitting   Grooming: Set up;Sitting   Upper Body Bathing:  Minimal assistance;Sitting   Lower Body Bathing: Moderate assistance;Sitting/lateral leans   Upper Body Dressing : Minimal assistance;Sitting   Lower Body Dressing: Moderate assistance;Sitting/lateral leans   Toilet Transfer: Moderate assistance;Stand-pivot;Cueing for sequencing;Cueing for safety;BSC/3in1 Toilet Transfer Details (indicate cue type and reason): simulated with transfer to recliner, unable to take steps safely with x1 assist from [place] and Hygiene: Sit to/from stand;Moderate assistance       Functional mobility during ADLs: Moderate assistance;Rolling walker (2 wheels);Cueing for sequencing;Cueing for safety General ADL Comments: Patient presenting with significant decreased activity tolerance and weakness, bilateral tremors in legs when standing, and need for increased time to follow basic commands     Vision Baseline Vision/History: 1 Wears glasses Ability to See in Adequate Light: 0 Adequate Patient Visual Report: No change from baseline       Perception     Praxis      Pertinent Vitals/Pain Pain Assessment Pain Assessment: Faces Faces Pain Scale: Hurts a little bit Pain Location: generalized with movement Pain Descriptors / Indicators: Grimacing, Guarding Pain Intervention(s): Limited activity within patient's tolerance, Monitored during session, Repositioned     Hand Dominance     Extremity/Trunk Assessment Upper Extremity Assessment Upper Extremity Assessment: Generalized weakness   Lower Extremity Assessment Lower Extremity Assessment: Defer to PT evaluation   Cervical / Trunk Assessment Cervical / Trunk Assessment: Kyphotic (minimally)   Communication Communication Communication: HOH   Cognition Arousal/Alertness: Awake/alert Behavior During Therapy: WFL for tasks assessed/performed Overall Cognitive Status: Impaired/Different from baseline Area of Impairment: Orientation, Attention, Memory, Following commands,  Safety/judgement, Awareness, Problem solving                 Orientation Level: Time (didnt not state exact date, but otherwise oriented) Current Attention Level: Sustained (could not dual task) Memory: Decreased recall of precautions Following Commands: Follows one step commands with increased time, Follows multi-step commands inconsistently Safety/Judgement: Decreased awareness of safety Awareness: Emergent Problem Solving: Slow processing, Decreased initiation, Difficulty sequencing, Requires verbal cues, Requires tactile cues General Comments: Patient with long pauses between all answers, with son usually stepping in to provide assist, patient requiring max multi-modal cues to sequence basic tasks     General Comments  HR to 115 with transfer, otherwise VSS on RA    Exercises     Shoulder Instructions      Home Living Family/patient expects to be discharged to:: Private residence Living Arrangements: Spouse/significant other Available Help at Discharge: Family Type of Home: House Home Access: Stairs to enter Technical brewer of Steps: 4-5 Entrance Stairs-Rails: Right Home Layout: Two level Alternate Level Stairs-Number of Steps: Flight (Has stair lift to ascend stairs) Alternate Level Stairs-Rails: Left Bathroom Shower/Tub: Occupational psychologist: Handicapped height (BSC over toilet)     Home Equipment: BSC/3in1;Shower seat;Rolling Environmental consultant (2 wheels) (Stair lift to second floor)          Prior Functioning/Environment Prior Level of Function : Needs assist (Since foot surgery in March)             Mobility Comments: was walking with a walker, had foot surgery in April that restricted full weight bearing, now WBAT ADLs Comments: needing a little assist for basic ADLs, wife completing IADLs        OT Problem List: Decreased strength;Decreased activity tolerance;Impaired balance (sitting and/or standing);Decreased coordination;Decreased  cognition;Decreased safety awareness;Decreased knowledge of use of DME or AE;Decreased knowledge  of precautions      OT Treatment/Interventions: Self-care/ADL training;Therapeutic exercise;Energy conservation;DME and/or AE instruction;Manual therapy;Therapeutic activities;Cognitive remediation/compensation;Patient/family education;Balance training    OT Goals(Current goals can be found in the care plan section) Acute Rehab OT Goals Patient Stated Goal: to get stronger OT Goal Formulation: With patient/family Time For Goal Achievement: 08/28/21 Potential to Achieve Goals: Good  OT Frequency: Min 2X/week    Co-evaluation              AM-PAC OT "6 Clicks" Daily Activity     Outcome Measure Help from another person eating meals?: A Little Help from another person taking care of personal grooming?: A Little Help from another person toileting, which includes using toliet, bedpan, or urinal?: A Lot Help from another person bathing (including washing, rinsing, drying)?: A Lot Help from another person to put on and taking off regular upper body clothing?: A Little Help from another person to put on and taking off regular lower body clothing?: A Lot 6 Click Score: 15   End of Session Equipment Utilized During Treatment: Gait belt;Rolling walker (2 wheels) Nurse Communication: Mobility status  Activity Tolerance: Patient limited by fatigue;Patient tolerated treatment well Patient left: in chair;with call bell/phone within reach;with chair alarm set;with family/visitor present  OT Visit Diagnosis: Unsteadiness on feet (R26.81);Other abnormalities of gait and mobility (R26.89);Repeated falls (R29.6);Muscle weakness (generalized) (M62.81);History of falling (Z91.81)                Time: 9093-1121 OT Time Calculation (min): 30 min Charges:  OT General Charges $OT Visit: 1 Visit OT Evaluation $OT Eval Moderate Complexity: 1 Mod OT Treatments $Self Care/Home Management : 8-22 mins  Corinne Ports E. Reino Lybbert, OTR/L Acute Rehabilitation Services [phone] [place] 08/14/2021, 10:16 AM

## 2021-08-15 DIAGNOSIS — Z9049 Acquired absence of other specified parts of digestive tract: Secondary | ICD-10-CM | POA: Diagnosis not present

## 2021-08-15 DIAGNOSIS — Z90410 Acquired total absence of pancreas: Secondary | ICD-10-CM | POA: Diagnosis not present

## 2021-08-15 DIAGNOSIS — R8271 Bacteriuria: Secondary | ICD-10-CM

## 2021-08-15 LAB — CBC WITH DIFFERENTIAL/PLATELET
Abs Immature Granulocytes: 0.12 10*3/uL — ABNORMAL HIGH (ref 0.00–0.07)
Basophils Absolute: 0.1 10*3/uL (ref 0.0–0.1)
Basophils Relative: 1 %
Eosinophils Absolute: 0.4 10*3/uL (ref 0.0–0.5)
Eosinophils Relative: 4 %
HCT: 26.3 % — ABNORMAL LOW (ref 39.0–52.0)
Hemoglobin: 8.5 g/dL — ABNORMAL LOW (ref 13.0–17.0)
Immature Granulocytes: 1 %
Lymphocytes Relative: 8 %
Lymphs Abs: 0.9 10*3/uL (ref 0.7–4.0)
MCH: 27.8 pg (ref 26.0–34.0)
MCHC: 32.3 g/dL (ref 30.0–36.0)
MCV: 85.9 fL (ref 80.0–100.0)
Monocytes Absolute: 0.8 10*3/uL (ref 0.1–1.0)
Monocytes Relative: 7 %
Neutro Abs: 8.7 10*3/uL — ABNORMAL HIGH (ref 1.7–7.7)
Neutrophils Relative %: 79 %
Platelets: 523 10*3/uL — ABNORMAL HIGH (ref 150–400)
RBC: 3.06 MIL/uL — ABNORMAL LOW (ref 4.22–5.81)
RDW: 15.1 % (ref 11.5–15.5)
WBC: 10.9 10*3/uL — ABNORMAL HIGH (ref 4.0–10.5)
nRBC: 0 % (ref 0.0–0.2)

## 2021-08-15 LAB — COMPREHENSIVE METABOLIC PANEL
ALT: 42 U/L (ref 0–44)
AST: 30 U/L (ref 15–41)
Albumin: 2.3 g/dL — ABNORMAL LOW (ref 3.5–5.0)
Alkaline Phosphatase: 626 U/L — ABNORMAL HIGH (ref 38–126)
Anion gap: 5 (ref 5–15)
BUN: 16 mg/dL (ref 8–23)
CO2: 29 mmol/L (ref 22–32)
Calcium: 8.3 mg/dL — ABNORMAL LOW (ref 8.9–10.3)
Chloride: 100 mmol/L (ref 98–111)
Creatinine, Ser: 1 mg/dL (ref 0.61–1.24)
GFR, Estimated: >60 mL/min (ref >60)
Glucose, Bld: 98 mg/dL (ref 70–99)
Potassium: 4 mmol/L (ref 3.5–5.1)
Sodium: 134 mmol/L — ABNORMAL LOW (ref 135–145)
Total Bilirubin: 0.4 mg/dL (ref 0.3–1.2)
Total Protein: 5.4 g/dL — ABNORMAL LOW (ref 6.5–8.1)

## 2021-08-15 LAB — GLUCOSE, CAPILLARY
Glucose-Capillary: 136 mg/dL — ABNORMAL HIGH (ref 70–99)
Glucose-Capillary: 146 mg/dL — ABNORMAL HIGH (ref 70–99)
Glucose-Capillary: 207 mg/dL — ABNORMAL HIGH (ref 70–99)
Glucose-Capillary: 124 mg/dL — ABNORMAL HIGH (ref 70–99)
Glucose-Capillary: 102 mg/dL — ABNORMAL HIGH (ref 70–99)

## 2021-08-15 LAB — URINE CULTURE: Culture: 70000 — AB

## 2021-08-15 LAB — PHOSPHORUS: Phosphorus: 3.4 mg/dL (ref 2.5–4.6)

## 2021-08-15 LAB — MAGNESIUM: Magnesium: 1.8 mg/dL (ref 1.7–2.4)

## 2021-08-15 NOTE — Progress Notes (Signed)
Physical Therapy Treatment Patient Details Name: ADRIK KHIM MRN: [id number] DOB: [birth year] Today's Date: 08/15/2021   History of Present Illness Pt is a 76 yr old M admitted on 08/11/21 from SNF.  Pt has been at [facility] since having Whipple on 5/1, transferred to SNF who immediately reported inability to care for pt.  Imaging (+) for R parotid gland lesion, suspicious of neoplasm.  PMH: DM, multiple myeloma, tremors, prostate CA, HTN    PT Comments    Pt received OOB in recliner on arrival and agreeable to session. Pt with some anxiety for mobility but with focus on breath and PLB pt with more confidence. Pt able to come to standing from recliner with min a to steady on rise and with close chair follow demonstrate x3 bouts of ambulation in room with increasing distance each trial. Pt with tremor throughout that he describes as increasing when he stands still, pt benefits from swift transition from sit to stand to ambulation. Pt stating this sessions ambulation was first since May 1. Pt continues to be highly motivated with good participation and current plan remains appropriate to address deficits and maximize functional independence and decrease caregiver burden. Pt continues to benefit from skilled PT services to progress toward functional mobility goals.    Recommendations for follow up therapy are one component of a multi-disciplinary discharge planning process, led by the attending physician.  Recommendations may be updated based on patient status, additional functional criteria and insurance authorization.  Follow Up Recommendations  Acute inpatient rehab (3hours/day)     Assistance Recommended at Discharge Frequent or constant Supervision/Assistance  Patient can return home with the following A lot of help with walking and/or transfers;A lot of help with bathing/dressing/bathroom;Assist for transportation;Assistance with cooking/housework;Help with stairs or ramp for entrance    Equipment Recommendations  Rolling walker (2 wheels);BSC/3in1    Recommendations for Other Services       Precautions / Restrictions Precautions Precautions: Fall Precaution Comments: GJ tube Restrictions Weight Bearing Restrictions: No     Mobility  Bed Mobility Overal bed mobility: Modified Independent             General bed mobility comments: OOB in chair on arrival    Transfers Overall transfer level: Needs assistance Equipment used: Rolling walker (2 wheels) Transfers: Sit to/from Stand, Bed to chair/wheelchair/BSC Sit to Stand: Min assist           General transfer comment: min assist to come to standing x4 throughout session to steady    Ambulation/Gait Ambulation/Gait assistance: Mod assist, +2 safety/equipment Gait Distance (Feet): 45 Feet (4+09+81) Assistive device: Rolling walker (2 wheels) Gait Pattern/deviations: Step-through pattern, Narrow base of support, Trunk flexed Gait velocity: decr     General Gait Details: slow gait with tremor, close chair follow for safety, cues throughout for PLB and to breathe generally as pt with tendcy for holding breath. Distance limited to fatigue. Pt stating this was first time walking since May 1   Stairs             Wheelchair Mobility    Modified Rankin (Stroke Patients Only)       Balance Overall balance assessment: Needs assistance Sitting-balance support: Feet supported, Single extremity supported Sitting balance-Leahy Scale: Fair     Standing balance support: Bilateral upper extremity supported, During functional activity, Reliant on assistive device for balance Standing balance-Leahy Scale: Fair Standing balance comment: reliant on RW for support  Cognition Arousal/Alertness: Awake/alert Behavior During Therapy: WFL for tasks assessed/performed Overall Cognitive Status: Within Functional Limits for tasks assessed                                           Exercises Other Exercises Other Exercises: standing marching x10    General Comments General comments (skin integrity, edema, etc.): VSS on RA, pt with essential tremor and also "orthostaic tremor" which he described as a tremor when standing still, pt benefits from swift transition from STS to marching and/or ambulating without extended pause.      Pertinent Vitals/Pain Pain Assessment Pain Assessment: Faces Faces Pain Scale: Hurts a little bit Pain Location: generalized with movement Pain Descriptors / Indicators: Grimacing, Guarding Pain Intervention(s): Monitored during session, Limited activity within patient's tolerance    Home Living   Living Arrangements: Spouse/significant other Available Help at Discharge: Family;Available 24 hours/day (family going to hire additional help) Type of Home: House Home Access: Stairs to enter Entrance Stairs-Rails: Right (rails on right in [place]. Rails on right and left but can't reach at same time in front of house) Entrance Stairs-Number of Steps: 3 Alternate Level Stairs-Number of Steps: has chair lift Home Layout: Two level;1/2 bath on main level        Prior Function            PT Goals (current goals can now be found in the care plan section) Acute Rehab PT Goals Patient Stated Goal: Pt's goal is to be able to walk PT Goal Formulation: With patient Time For Goal Achievement: 08/28/21    Frequency    Min 3X/week      PT Plan      Co-evaluation              AM-PAC PT "6 Clicks" Mobility   Outcome Measure  Help needed turning from your back to your side while in a flat bed without using bedrails?: A Little Help needed moving from lying on your back to sitting on the side of a flat bed without using bedrails?: A Little Help needed moving to and from a bed to a chair (including a wheelchair)?: A Lot Help needed standing up from a chair using your arms (e.g., wheelchair or bedside  chair)?: A Little Help needed to walk in hospital room?: A Lot Help needed climbing 3-5 steps with a railing? : A Lot 6 Click Score: 15    End of Session Equipment Utilized During Treatment: Gait belt Activity Tolerance: Patient tolerated treatment well Patient left: in chair;with call bell/phone within reach Nurse Communication: Mobility status PT Visit Diagnosis: Unsteadiness on feet (R26.81)     Time: 3151-7616 PT Time Calculation (min) (ACUTE ONLY): 34 min  Charges:  $Gait Training: 23-37 mins                     Audry Riles. PTA Acute Rehabilitation Services Office: [facility] 08/15/2021, 2:58 PM

## 2021-08-15 NOTE — Progress Notes (Signed)
Occupational Therapy Treatment Patient Details Name: William Barron MRN: [id number] DOB: [birth year] Today's Date: 08/15/2021   History of present illness Pt is a 76 yr old M admitted on 08/11/21 from SNF.  Pt has been at [facility] since having Whipple on 5/1, transferred to SNF who immediately reported inability to care for pt.  Imaging (+) for R parotid gland lesion, suspicious of neoplasm.  PMH: DM, multiple myeloma, tremors, prostate CA, HTN   OT comments  Pt. Seen for skilled OT treatment session.  Bed mobility Mod I.  Mod a for step pivot and stand pivot transfers.  Requires cues for sequencing and safe technique.  Emphasis on PLB during mobility and positional changes.  Pt. With tendency to hold breath or not breathe out increased sob and anxiety.  Responded well and began to implement towards end of session without as many cues required.  Agree with current d/c recommendations.  Pt. Will definitely benefit from cont. Intense level therapies prior to home to ensure optimal safety and independence.     Recommendations for follow up therapy are one component of a multi-disciplinary discharge planning process, led by the attending physician.  Recommendations may be updated based on patient status, additional functional criteria and insurance authorization.    Follow Up Recommendations  Acute inpatient rehab (3hours/day)    Assistance Recommended at Discharge Frequent or constant Supervision/Assistance  Patient can return home with the following  A lot of help with walking and/or transfers;A lot of help with bathing/dressing/bathroom;Assistance with cooking/housework;Direct supervision/assist for medications management;Direct supervision/assist for financial management;Assist for transportation;Help with stairs or ramp for entrance   Equipment Recommendations  Other (comment)    Recommendations for Other Services Rehab consult    Precautions / Restrictions Precautions Precautions:  Fall Precaution Comments: GJ tube Restrictions Weight Bearing Restrictions: No       Mobility Bed Mobility Overal bed mobility: Modified Independent Bed Mobility: Sidelying to Sit   Sidelying to sit: Min guard       General bed mobility comments: cues to utilize bed rails to pull to eob and able to manage les off of bed without assistance    Transfers Overall transfer level: Needs assistance Equipment used: Rolling walker (2 wheels) Transfers: Sit to/from Stand, Bed to chair/wheelchair/BSC Sit to Stand: Min assist, Mod assist Stand pivot transfers: Mod assist   Step pivot transfers: Mod assist     General transfer comment: eob approx. 4 steps to recliner. max cues to reach chair with legs prior to trying to sit and reach for arm rests.  notable panic and rushing to sit.  stand pivot from recliner to bsc mod a with cues for hand placement and breathing strategies to decreas sob and anxiety causing dizziness     Balance                                           ADL either performed or assessed with clinical judgement   ADL Overall ADL's : Needs assistance/impaired                     Lower Body Dressing: Set up;Sitting/lateral leans Lower Body Dressing Details (indicate cue type and reason): able to cross each leg over knee for lb dressing Toilet Transfer: Moderate assistance;Stand-pivot;Cueing for sequencing;Cueing for safety;BSC/3in1           Functional mobility during ADLs: Moderate assistance;Rolling  walker (2 wheels);Cueing for sequencing;Cueing for safety General ADL Comments: Patient presenting with significant decreased activity tolerance and weakness, bilateral tremors in legs when standing, education, demo, return demo on PLB for sob and visible anxiety with movement. tendancy to hold breath or beathe in but not out. responded well to PLB and demonstrated improvment with tolerance and safety with sit/stand when breathing utilized.  thanked me and said "the breathing it really helped"    Extremity/Trunk Assessment              Vision       Perception     Praxis      Cognition Arousal/Alertness: Awake/alert Behavior During Therapy: WFL for tasks assessed/performed Overall Cognitive Status: Within Functional Limits for tasks assessed                                          Exercises      Shoulder Instructions       General Comments      Pertinent Vitals/ Pain       Pain Assessment Pain Assessment: No/denies pain  Home Living   Living Arrangements: Spouse/significant other Available Help at Discharge: Family;Available 24 hours/day (family going to hire additional help) Type of Home: House Home Access: Stairs to enter [employer] of Steps: 3 Entrance Stairs-Rails: Right (rails on right in [place]. Rails on right and left but can't reach at same time in front of house) Home Layout: Two level;1/2 bath on main level Alternate Level Stairs-Number of Steps: has chair lift   Bathroom Shower/Tub: Occupational psychologist: Standard Bathroom Accessibility: Yes How Accessible: Accessible via walker        Lives With: Spouse    Prior Functioning/Environment              Frequency  Min 2X/week        Progress Toward Goals  OT Goals(current goals can now be found in the care plan section)  Progress towards OT goals: Progressing toward goals     Plan Discharge plan remains appropriate    Co-evaluation                 AM-PAC OT "6 Clicks" Daily Activity     Outcome Measure   Help from another person eating meals?: A Little Help from another person taking care of personal grooming?: A Little Help from another person toileting, which includes using toliet, bedpan, or urinal?: A Lot Help from another person bathing (including washing, rinsing, drying)?: A Lot Help from another person to put on and taking off regular upper body  clothing?: A Little Help from another person to put on and taking off regular lower body clothing?: A Lot 6 Click Score: 15    End of Session Equipment Utilized During Treatment: Gait belt;Rolling walker (2 wheels)  OT Visit Diagnosis: Unsteadiness on feet (R26.81);Other abnormalities of gait and mobility (R26.89);Repeated falls (R29.6);Muscle weakness (generalized) (M62.81);History of falling (Z91.81)   Activity Tolerance Patient tolerated treatment well   Patient Left in chair;with call bell/phone within reach   Nurse Communication Other (comment) (alerted CNA pt. on bsc and will press call bell when ready for assistance)        Time: 1610-9604 OT Time Calculation (min): 24 min  Charges: OT General Charges $OT Visit: 1 Visit OT Treatments $Self Care/Home Management : 23-37 mins  Sonia Baller, COTA/L Acute Rehabilitation [phone]  Tanya Nones 08/15/2021, 12:31 PM

## 2021-08-15 NOTE — Progress Notes (Signed)
Inpatient Rehab Admissions:  Inpatient Rehab Consult received.  I met with patient and son [name] at the bedside for rehabilitation assessment and to discuss goals and expectations of an inpatient rehab admission.  Both acknowledged understanding of  CIR goals and expectations. Both interested in pt pursuing CIR. Son confirmed that wife along with hired assistance will be able to provide 24/7 support for pt after discharge. Will continue to follow.  Signed: Gayland Curry, [place], [place] Admissions Coordinator [phone]

## 2021-08-15 NOTE — Progress Notes (Signed)
PROGRESS NOTE    William Barron  CBS:[id number] DOB: 1945-10-04 DOA: 08/11/2021 PCP: [name], MD  Chief Complaint  Patient presents with   Needs SNF placement    Brief Narrative:  William Barron is William Barron 76 y.o. male with medical history significant of pancreatic mass status post Whipple and J/G tube April-May 2023, multiple myeloma, prostate cancer status post radiation therapy, HTN, HLD, CAD, amyloidosis, IDDM who was recently discharged from [name] 5/30 after prolonged hospital stay after whipple procedure.  According to [name]'s documentation, patient has strong family history of pancreatic cancer, decision was made for resection of pancreas.  Whipple was performed and postop pathology extensive involvement by amyloidosis (no definite evidence of IPMN or mucinous cystic neoplasm component is identified) (07/13/2021 pathology - previous note had mentioned pathology showing IPMN, but I don't see this). After surgery, patient was on TPN briefly then JG tube inserted by [name] services. He arrived at [facility] on 5/30 and they were under the impression they could not handle his GJ.  They did not receive his discharge summary until he was en route, per my discussion with thme.  It sounds like they thought he needed continuous suction (though per discharge summary, it was noted that g port could be placed to suction for nausea as needed, but it was mostly to gravity).  They initially asked to go back to [name], but EMS wouldn't take them there.  Due to his continued intractable hiccups and need for care and the belief they were unable to manage him at [facility], he came to the [facility] ED.      Assessment & Plan:   Principal Problem:   H/O Whipple procedure Active Problems:   Intractable hiccups   Candida esophagitis (HCC)   Facial droop   Diabetes (HCC)   Anemia   Hypoalbuminemia   Frequent PVCs   Prolonged QT interval   Amyloidosis (HCC)   Multiple myeloma (HCC)   Asymptomatic  bacteriuria   Prostate cancer (HCC)   Hormone replacement therapy   Lesion of parotid gland   Protein-calorie malnutrition, severe (HCC)   Feeding difficulties   Feeding tube dysfunction, initial encounter   Assessment and Plan: * H/O Whipple procedure Seems like some confusion regarding need for suction with the GJ -> ultimately led to him being admitted here He's s/p open whipple on 5/1 (care everywhere documentation confusing regarding dates of surgery) Post op course complicated by delirium, UTI, recurrent/continued need for NG placement GJ placed on 5/18 At the time of discharge, had continuous feeds via j port and g port was placed gravity mostly (occasionally placed to suction to alleviate nausea and drain stomach) Clear liquids for comfort (pt son had talked to Dr. Hyman Barron regarding this) Per dc instructions, ok to hook g port to suction of patient bc distended, nauseated or with significant hiccups, should be on continuous j tube feeds    Intractable hiccups Per my discussion with William Barron from [place], intractable hiccups were part of the reason he came [facility] Will stop thorazine and trial gabapentin  Follow, as above, can hook g port to suction as needed for distension or hiccups  Facial droop Mild L facial droop, he'd complained of confusion last night and was concerned he might be having William Barron stroke.  May have been related to thorazine? 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 Surgical pathology from 5/1 with  hepatic artery LN involved by amyloid, gallbladder involved by amyloid, pancreas, portion of stomach, and duodenum with extensive involvement by amyloidosis, 13 lymph nodes involved by amyloidosis, negative for tumor (see report in care everywhere)  Asymptomatic bacteriuria ESBL klebsiella and citrobacter freundii  Currently asymptomatic Will follow for sx   Multiple myeloma ([name]) ixazomib is on d/c meds from [name] Also on 4 mg dexamethazone once weekly  Holding at this time (he's not currently taking these meds)  [facility] at [facility]) S/p radiation Will follow with pt/wife  Hormone replacement therapy Testosterone is on his d/c meds from [facility] for now  Lesion of parotid gland Needs ENT follow up outpatient  Concerning for parotid neoplasm  Protein-calorie malnutrition, severe ([name]) Tube feeds RD c/s      DVT prophylaxis: lovenox Code Status: full Family Communication: wife Disposition:   Status is: Inpatient Remains inpatient appropriate because: pending safe discharge plan - stable for discharge - looking into CIR, etc   Consultants:  none  Procedures:  none  Antimicrobials:  Anti-infectives (From admission, onward)    Start     Dose/Rate Route Frequency Ordered Stop   08/15/21 1000  fluconazole (DIFLUCAN) tablet 200 mg  Status:  Discontinued        200 mg Per Tube Daily 08/14/21 1106 08/14/21 1230   08/14/21 1230  fluconazole (DIFLUCAN) tablet 200 mg        200 mg Per Tube Daily 08/14/21 1230 08/21/21 0959   08/12/21 1348  fluconazole (DIFLUCAN) 40 MG/ML suspension 200 mg  Status:  Discontinued       Note to Pharmacy: Qd x 10 days     200 mg Per Tube See admin instructions 08/12/21 1352 08/12/21 1354   08/12/21 1000  fluconazole (DIFLUCAN) tablet 200 mg  Status:  Discontinued        200 mg Oral Daily 08/12/21 0152 08/14/21 1106       Subjective: No new complaints  Objective: Vitals:   08/14/21 2046 08/15/21 0437 08/15/21  0438 08/15/21 0741  BP: (!) 106/50 129/60  (!) 122/52  Pulse: 83 79  82  Resp: '17 20  16  ' Temp:    98.2 F (36.8 C)  TempSrc:    Oral  SpO2: 99% 98%  93%  Weight:   57.6 kg   Height:        Intake/Output Summary (Last 24 hours) at 08/15/2021 1307 Last data filed at 08/15/2021 0440 Gross per 24 hour  Intake 2822.5 ml  Output 875 ml  Net 1947.5 ml   Filed Weights   08/13/21 0559 08/14/21 0600 08/15/21 0438  Weight: 56.5 kg 54.2 kg 57.6 kg    Examination:  General: No acute distress. Cardiovascular: RRR Lungs: unlabored Abdomen: Soft, nontender, nondistended - GJ tube Neurological: Alert and oriented 3. Moves all extremities 4 with equal strength. Cranial nerves II through XII grossly intact. Extremities: no LEE  Data Reviewed: I have personally reviewed following labs and imaging studies  CBC: Recent Labs  Lab 08/11/21 1900 08/15/21 0130  WBC 8.4 10.9*  NEUTROABS 7.0 8.7*  HGB 8.1* 8.5*  HCT 26.8* 26.3*  MCV 90.5 85.9  PLT 422* 523*    Basic Metabolic Panel: Recent Labs  Lab 08/11/21 1900 08/12/21 1921 08/13/21 0604 08/15/21 0130  NA 135  --  132* 134*  K 3.9  --  4.6 4.0  CL 107  --  99 100  CO2 20*  --  25 29  GLUCOSE 112*  --  215* 98  BUN 15  --  15 16  CREATININE 0.71  --  0.95 1.00  CALCIUM 7.5*  --  8.5* 8.3*  MG  --  2.0  --  1.8  PHOS  --   --   --  3.4    GFR: Estimated Creatinine Clearance: 49.6 mL/min (by C-G formula based on SCr of 1 mg/dL).  Liver Function Tests: Recent Labs  Lab 08/11/21 1900 08/15/21 0130  AST 24 30  ALT 27 42  ALKPHOS 406* 626*  BILITOT 0.1* 0.4  PROT 5.1* 5.4*  ALBUMIN 2.1* 2.3*    CBG: Recent Labs  Lab 08/14/21 2041 08/14/21 2342 08/15/21 0435 08/15/21 0756 08/15/21 1118  GLUCAP 156* 114* 136* 146* 207*     Recent Results (from the past 240 hour(s))  Urine Culture     Status: Abnormal   Collection Time: 08/11/21  8:53 PM   Specimen: Urine, Clean Catch  Result Value Ref Range Status    Specimen Description URINE, CLEAN CATCH  Final   Special Requests   Final    NONE Performed at [facility], [place] [address] [place], [place] [address]    Culture (Allice Garro)  Final    70,000 COLONIES/mL KLEBSIELLA PNEUMONIAE Confirmed Extended Spectrum Beta-Lactamase Producer (ESBL).  In bloodstream infections from ESBL organisms, carbapenems are preferred over piperacillin/tazobactam. They are shown to have Elya Diloreto lower risk of mortality. 20,000 COLONIES/mL CITROBACTER FREUNDII    Report Status 08/15/2021 FINAL  Final   Organism ID, Bacteria KLEBSIELLA PNEUMONIAE (Caira Poche)  Final   Organism ID, Bacteria CITROBACTER FREUNDII (Corynne Scibilia)  Final      Susceptibility   Citrobacter freundii - MIC*    CEFAZOLIN >=64 RESISTANT Resistant     CEFEPIME <=0.12 SENSITIVE Sensitive     CEFTRIAXONE <=0.25 SENSITIVE Sensitive     CIPROFLOXACIN <=0.25 SENSITIVE Sensitive     GENTAMICIN <=1 SENSITIVE Sensitive     IMIPENEM 0.5 SENSITIVE Sensitive     NITROFURANTOIN 32 SENSITIVE Sensitive     TRIMETH/SULFA <=20 SENSITIVE Sensitive     PIP/TAZO 8 SENSITIVE Sensitive     * 20,000 COLONIES/mL CITROBACTER FREUNDII   Klebsiella pneumoniae - MIC*    AMPICILLIN >=32 RESISTANT Resistant     CEFAZOLIN >=64 RESISTANT Resistant     CEFEPIME >=32 RESISTANT Resistant     CEFTRIAXONE >=64 RESISTANT Resistant     CIPROFLOXACIN 2 RESISTANT Resistant     GENTAMICIN >=16 RESISTANT Resistant     IMIPENEM 0.5 SENSITIVE Sensitive     NITROFURANTOIN 64 INTERMEDIATE Intermediate     TRIMETH/SULFA >=320 RESISTANT Resistant     AMPICILLIN/SULBACTAM >=32 RESISTANT Resistant     PIP/TAZO 16 SENSITIVE Sensitive     * 70,000 COLONIES/mL KLEBSIELLA PNEUMONIAE  MRSA Next Gen by PCR, Nasal     Status: None   Collection Time: 08/13/21 10:50 AM   Specimen: Nasal Mucosa; Nasal Swab  Result Value Ref Range Status   MRSA by PCR Next Gen NOT DETECTED NOT DETECTED Final    Comment: (NOTE) The GeneXpert MRSA Assay (FDA approved for NASAL specimens  only), is one  component of [facility] comprehensive MRSA colonization surveillance program. It is not intended to diagnose MRSA infection nor to guide or monitor treatment for MRSA infections. Test performance is not FDA approved in patients less than 57 years old. Performed at [facility], [place] [address] [place], [place] [address]          Radiology Studies: No results found.      Scheduled Meds:  chlorhexidine  15 mL Mouth Rinse BID   enoxaparin (LOVENOX) injection  40 mg Subcutaneous Q24H   fluconazole  200 mg Per Tube Daily   gabapentin  100 mg Per Tube Q8H   insulin aspart  0-9 Units Subcutaneous Q4H   insulin glargine-yfgn  6 Units Subcutaneous Daily   mouth rinse  15 mL Mouth Rinse q12n4p   melatonin  6 mg Per Tube QHS   metoprolol tartrate  12.5 mg Per J Tube BID   pantoprazole sodium  40 mg Per Tube Daily   Continuous Infusions:  feeding supplement (GLUCERNA 1.5 CAL) 1,000 mL (08/14/21 2201)     LOS: 2 days    Time spent: over 30 min    Fayrene Helper, MD [facility]   To contact the attending provider between 7A-7P or the covering provider during after hours 7P-7A, please log into the web site [email or web address] and access using universal [place] password for that web site. If you do not have the password, please call the hospital operator.  08/15/2021, 1:07 PM

## 2021-08-15 NOTE — PMR Pre-admission (Signed)
PMR Admission Coordinator Pre-Admission Assessment  Patient: William Barron is an 76 y.o., male MRN: [id number] DOB: 1946/02/13 Height: '5\' 6"'  (167.6 cm) Weight: 56.8 kg  Insurance Information HMO:     PPO:      PCP:      IPA:      80/20: yes     OTHER:  PRIMARY: Medicare A & B      Policy#: [id number]      Subscriber: patient CM Name:       Phone#:      Fax#:  Pre-Cert#:       Employer:  Benefits:  Phone #: verified eligibility via [place] on 08/15/21     Name:  Eff. Date: Part A & B effective 06/13/08     Deduct: $1,500      Out of Pocket Max: NA      Life Max: NA CIR: 100% coverage      SNF: 100% coverage days 1-20, 80% coverage days 21-100 Outpatient: 80% coverage     Co-Pay: 20% Home Health: 100% coverage      Co-Pay:  DME: 80% coverage     Co-Pay: 20% Providers: pt's choice SECONDARY: BCBS Supplement     Policy#: [id number]     Phone#: [phone]  Financial Counselor:       Phone#:   The "Data Collection Information Summary" for patients in Inpatient Rehabilitation Facilities with attached "Privacy Act [place] Records" was provided and verbally reviewed with: Patient  Emergency Contact Information Contact Information     Name Relation Home Work Mobile   [place] Spouse [phone]  [phone]   PLATON, AROCHO   [phone]       Current Medical History  Patient Admitting Diagnosis: Debility s/p Whipple procedure History of Present Illness: Pt is a 76 year old male with medical hx significant for: HTN, CAD, HLD, amyloidosis, IDDM, multiple myeloma, prostate CA s/p radiation tx, pancreatic mass s/p Whipple and J/G tube placement in April-May 2023, foot surgery March/April 2023. Pt presented to [facility] on 08/11/21 from [facility]. After pt being there for 15 minutes, facility stated they could not care for pt and sent pt to ED. Pt recently had prolonged hospitalization at [name] d/t Whipple procedure on 07/13/21. [facility] pathology  showed IPMN (intraductal papillary mucinous neoplasm) Hospital stay complicated by presume UTI, delirium, GJ tube replacements, candidal esophagitis. Dr. Hyman Hopes, his surgeon at [facility], accepted pt to [facility] for readmission. Pt unable to be transferred ED to ED d/t his stability; pt placed on waitlist for a bed at [facility] but pt and wife wanted to be removed from the list.  Due to pt report of abdominal pain in ED, CT performed. CT showed interval resection of pancreatic head and cystic mass without signs of complicating features. Therapy evaluations completed and CIR recommended d/t pt's deficits in functional mobility and inability to complete ADLs independently.    Patient's medical record from [facility] has been reviewed by the rehabilitation admission coordinator and physician.  Past Medical History  Past Medical History:  Diagnosis Date   Amyloidosis ([place])    Diabetes mellitus without complication (HCC)    GERD (gastroesophageal reflux disease)    Hearing loss    Has hearing aids   History of kidney stones    Hyperlipidemia    Multiple myeloma ([place])    and amylodosis    Nephrolithiasis    Occasional tremors    Pneumonia    Prostate cancer ([place])    Prostatitis  acute and chronic   Skin cancer     Has the patient had major surgery during 100 days prior to admission? Yes  Family History   family history includes CAD (age of onset: 47) in his brother; Cancer in his paternal grandmother; Pancreatic cancer in his father; Parkinsonism in his mother; Prostate cancer in his father.  Current Medications  Current Facility-Administered Medications:    chlorhexidine (PERIDEX) 0.12 % solution 15 mL, 15 mL, Mouth Rinse, BID, Elodia Florence., MD, 15 mL at 08/18/21 2201   enoxaparin (LOVENOX) injection 40 mg, 40 mg, Subcutaneous, Q24H, [place], Ping T, MD, 40 mg at 08/18/21 1648   feeding supplement (GLUCERNA 1.5 CAL) liquid 1,000 mL, 1,000 mL, Per Tube, Continuous, Elodia Florence., MD, Last Rate: 65 mL/hr at 08/19/21 0020, 1,000 mL at 08/19/21 0020   ferrous sulfate 220 (44 Fe) MG/5ML solution 220 mg, 220 mg, Per Tube, Hetty Blend., MD, 220 mg at 08/18/21 1024   fluconazole (DIFLUCAN) tablet 200 mg, 200 mg, Per Tube, Daily, Elodia Florence., MD, 200 mg at 08/18/21 1025   gabapentin (NEURONTIN) 250 MG/5ML solution 100 mg, 100 mg, Per Tube, Q8H, Elodia Florence., MD, 100 mg at 08/19/21 0508   insulin aspart (novoLOG) injection 0-9 Units, 0-9 Units, Subcutaneous, Q4H, Elodia Florence., MD, 1 Units at 08/19/21 0746   insulin glargine-yfgn (SEMGLEE) injection 6 Units, 6 Units, Subcutaneous, Daily, Gareth Morgan, MD, 6 Units at 08/18/21 1024   MEDLINE mouth rinse, 15 mL, Mouth Rinse, q12n4p, Elodia Florence., MD, 15 mL at 08/18/21 1648   melatonin tablet 6 mg, 6 mg, Per Tube, QHS, Wynetta Fines T, MD, 6 mg at 08/17/21 2038   metoprolol tartrate (LOPRESSOR) tablet 12.5 mg, 12.5 mg, Per J Tube, BID, Wynetta Fines T, MD, 12.5 mg at 08/18/21 2201   oxyCODONE (Oxy IR/ROXICODONE) immediate release tablet 2.5 mg, 2.5 mg, Per Tube, Q4H PRN, Gareth Morgan, MD, 2.5 mg at 08/13/21 2158   pantoprazole sodium (PROTONIX) 40 mg/20 mL oral suspension 40 mg, 40 mg, Per Tube, Daily, Schlossman, Erin, MD, 40 mg at 08/18/21 1038   phenol (CHLORASEPTIC) mouth spray 1 spray, 1 spray, Mouth/Throat, PRN, Gareth Morgan, MD   testosterone (ANDROGEL) 50 MG/5GM (1%) gel 10 g, 10 g, Transdermal, Daily, Elodia Florence., MD, 10 g at 08/18/21 1024   traZODone (DESYREL) tablet 50 mg, 50 mg, Per Tube, QHS PRN, Lequita Halt, MD, 50 mg at 08/18/21 2208  Patients Current Diet:  Diet Order             Diet clear liquid Room service appropriate? Yes; Fluid consistency: Thin  Diet effective now                   Precautions / Restrictions Precautions Precautions: Fall Precaution Comments: GJ tube Restrictions Weight Bearing Restrictions:  No   Has the patient had 2 or more falls or a fall with injury in the past year? No  Prior Activity Level    Prior Functional Level Self Care: Did the patient need help bathing, dressing, using the toilet or eating? Independent  Indoor Mobility: Did the patient need assistance with walking from room to room (with or without device)? Independent  Stairs: Did the patient need assistance with internal or external stairs (with or without device)? Independent (prior to foot surgery. Had chair lift placed in house d/t surgery and weightbearing status)  Functional Cognition: Did the patient  need help planning regular tasks such as shopping or remembering to take medications? Independent  Patient Information Are you of Hispanic, Latino/a,or Spanish origin?: A. No, not of Hispanic, Latino/a, or Spanish origin What is your race?: A. White Do you need or want an interpreter to communicate with a doctor or health care staff?: 0. No  Patient's Response To:  Health Literacy and Transportation Is the patient able to respond to health literacy and transportation needs?: Yes Health Literacy - How often do you need to have someone help you when you read instructions, pamphlets, or other written material from your doctor or pharmacy?: Never In the past 12 months, has lack of transportation kept you from medical appointments or from getting medications?: No In the past 12 months, has lack of transportation kept you from meetings, work, or from getting things needed for daily living?: No  Development worker, international aid / [place] Devices/Equipment: None Home Equipment: None  Prior Device Use: Indicate devices/aids used by the patient prior to current illness, exacerbation or injury? None of the above (used a walker temporarily after foot surgery)  Current Functional Level Cognition  Overall Cognitive Status: Within Functional Limits for tasks assessed Current Attention Level:  Sustained Orientation Level: Oriented X4 Following Commands: Follows one step commands with increased time, Follows multi-step commands inconsistently Safety/Judgement: Decreased awareness of safety General Comments: eager to participate    Extremity Assessment (includes Sensation/Coordination)  Upper Extremity Assessment: Defer to OT evaluation  Lower Extremity Assessment: Generalized weakness    ADLs  Overall ADL's : Needs assistance/impaired Eating/Feeding: Set up, Sitting Grooming: Brushing hair, Sitting, Set up Upper Body Bathing: Minimal assistance, Sitting Lower Body Bathing: Moderate assistance, Sitting/lateral leans Upper Body Dressing : Minimal assistance, Sitting Lower Body Dressing: Set up, Sitting/lateral leans Lower Body Dressing Details (indicate cue type and reason): able to cross legs to donn socks and shoes Toilet Transfer: Moderate assistance, Regular Toilet Toilet Transfer Details (indicate cue type and reason): ambulated into bathroom and required mod assist for balance and cues for safety Toileting- Clothing Manipulation and Hygiene: Moderate assistance, Sit to/from stand Toileting - Clothing Manipulation Details (indicate cue type and reason): toilet hygiene performed while standing with assistance for hygiene Functional mobility during ADLs: Moderate assistance, Rolling walker (2 wheels), Cueing for sequencing, Cueing for safety General ADL Comments: increased activity tolerance but continues to require cues for breathing technique    Mobility  Overal bed mobility: Modified Independent Bed Mobility: Sidelying to Sit Sidelying to sit: Min guard Supine to sit: Min guard Sit to supine: Modified independent (Device/Increase time) General bed mobility comments: OOB in recliner on arrival    Transfers  Overall transfer level: Needs assistance Equipment used: Rolling walker (2 wheels) Transfers: Sit to/from Stand, Bed to chair/wheelchair/BSC Sit to Stand: Min  assist, Min guard Bed to/from chair/wheelchair/BSC transfer type:: Step pivot, Stand pivot Stand pivot transfers: Mod assist Step pivot transfers: Mod assist General transfer comment: min assist down to min guard throughout session STS x3, light cues for hand placement    Ambulation / Gait / Stairs / Wheelchair Mobility  Ambulation/Gait Ambulation/Gait assistance: +2 safety/equipment, Mod assist Gait Distance (Feet): 100 Feet (x2) Assistive device: Rollator (4 wheels) Gait Pattern/deviations: Step-through pattern, Decreased stride length, Ataxic, Trunk flexed, Narrow base of support General Gait Details: slow ataxic gait with noted decrease in tremor this session, mod a +2 for very close chair follow for safety, cues throughout for PLB and to breathe generally and more slowly as pt with tendency  for holding breath. Gait velocity: decr    Posture / Balance Balance Overall balance assessment: Needs assistance Sitting-balance support: Feet supported, Single extremity supported Sitting balance-Leahy Scale: Fair Standing balance support: Bilateral upper extremity supported, During functional activity, Reliant on assistive device for balance Standing balance-Leahy Scale: Poor Standing balance comment: reliant on RW for support    Special needs/care consideration Diabetic management novoLOG 0-9 units every 4 hours, Semglee 6 units daily.Bladder incontinence, External urinary catheter, Peg-jejunostomy tube   Previous Home Environment (from acute therapy documentation) Living Arrangements: Spouse/significant other  Lives With: Spouse Available Help at Discharge: Family, Available 24 hours/day (family going to hire additional help) Type of Home: House Home Layout: Two level, 1/2 bath on main level Alternate Level Stairs-Rails: Left Alternate Level Stairs-Number of Steps: has chair lift Home Access: Stairs to enter Entrance Stairs-Rails: Right (rails on right in [place]. Rails on right and  left but can't reach at same time in front of house) Technical brewer of Steps: 3 Bathroom Shower/Tub: Multimedia programmer: Standard Bathroom Accessibility: Yes How Accessible: Accessible via walker Brenda: No Additional Comments: Has been using a RW at [facility] for transfers  Discharge Living Setting Plans for Discharge Living Setting: Patient's home Type of Home at Discharge: House Discharge Home Layout: Two level, 1/2 bath on main level Alternate Level Stairs-Number of Steps: has chair lift Discharge Home Access: Stairs to enter Entrance Stairs-Rails: Right (Right side for carport. Rails on the right and left side but cannot reach at the same time in front of house.) Entrance Stairs-Number of Steps: 3 Discharge Bathroom Shower/Tub: Walk-in shower Discharge Bathroom Toilet: Standard Discharge Bathroom Accessibility: Yes How Accessible: Accessible via walker Does the patient have any problems obtaining your medications?: No  Social/Family/Support Systems Anticipated Caregiver: Jahan Friedlander, wife and hired help Anticipated Ambulance person Information: [phone] Caregiver Availability: 24/7 Discharge Plan Discussed with Primary Caregiver: Yes Is Caregiver In Agreement with Plan?: Yes Does Caregiver/Family have Issues with Lodging/Transportation while Pt is in Rehab?: No  Goals Patient/Family Goal for Rehab: Supervision: PT/OT Expected length of stay: 7-10 days Pt/Family Agrees to Admission and willing to participate: Yes Program Orientation Provided & Reviewed with Pt/Caregiver Including Roles  & Responsibilities: Yes  Decrease burden of Care through IP rehab admission: NA  Possible need for SNF placement upon discharge: Not anticipated  Patient Condition: I have reviewed medical records from [facility], spoken with CSW, and patient and son. I met with patient at the bedside for inpatient rehabilitation assessment.  Patient will  benefit from ongoing PT and OT, can actively participate in 3 hours of therapy a day 5 days of the week, and can make measurable gains during the admission.  Patient will also benefit from the coordinated team approach during an Inpatient Acute Rehabilitation admission.  The patient will receive intensive therapy as well as Rehabilitation physician, nursing, social worker, and care management interventions.  Due to bladder management, safety, disease management, medication administration, pain management, and patient education the patient requires 24 hour a day rehabilitation nursing.  The patient is currently Min A with mobility and basic ADLs.  Discharge setting and therapy post discharge at home with home health is anticipated.  Patient has agreed to participate in the Acute Inpatient Rehabilitation Program and will admit today.  Preadmission Screen Completed By:  Bethel Born, 08/19/2021 8:24 AM ______________________________________________________________________   Discussed status with Dr. Curlene Dolphin on 08/19/21 at 20 and received approval for admission today.  Admission Coordinator:  Lauren Danford Bad, CCC-SLP, time 944/Date 08/19/21   Assessment/Plan: Diagnosis:Debility s/p Whipple procedure Does the need for close, 24 hr/day Medical supervision in concert with the patient's rehab needs make it unreasonable for this patient to be served in a less intensive setting? Yes Co-Morbidities requiring supervision/potential complications: DM, GERD, HLD, Multiple Myeloma, Prostate Cancer Due to bladder management, bowel management, safety, skin/wound care, disease management, medication administration, pain management, and patient education, does the patient require 24 hr/day rehab nursing? Yes Does the patient require coordinated care of a physician, rehab nurse, PT, OT, and SLP to address physical and functional deficits in the context of the above medical diagnosis(es)? Yes Addressing  deficits in the following areas: balance, endurance, locomotion, strength, transferring, bowel/bladder control, bathing, dressing, feeding, grooming, and toileting Can the patient actively participate in an intensive therapy program of at least 3 hrs of therapy 5 days a week? Yes The potential for patient to make measurable gains while on inpatient rehab is good Anticipated functional outcomes upon discharge from inpatient rehab: supervision PT, supervision OT, n/a SLP Estimated rehab length of stay to reach the above functional goals is: 7-10 Anticipated discharge destination: Home 10. Overall Rehab/Functional Prognosis: good   MD Signature: Jennye Boroughs

## 2021-08-15 NOTE — Assessment & Plan Note (Addendum)
ESBL klebsiella and citrobacter freundii  Currently asymptomatic. -No need for antibiotics. -Patient will be discharged to CIR.

## 2021-08-16 DIAGNOSIS — Z90410 Acquired total absence of pancreas: Secondary | ICD-10-CM | POA: Diagnosis not present

## 2021-08-16 DIAGNOSIS — Z9049 Acquired absence of other specified parts of digestive tract: Secondary | ICD-10-CM | POA: Diagnosis not present

## 2021-08-16 LAB — IRON AND TIBC
Iron: 16 ug/dL — ABNORMAL LOW (ref 45–182)
Saturation Ratios: 6 % — ABNORMAL LOW (ref 17.9–39.5)
TIBC: 270 ug/dL (ref 250–450)
UIBC: 254 ug/dL

## 2021-08-16 LAB — CBC WITH DIFFERENTIAL/PLATELET
Abs Immature Granulocytes: 0.15 10*3/uL — ABNORMAL HIGH (ref 0.00–0.07)
Basophils Absolute: 0 10*3/uL (ref 0.0–0.1)
Basophils Relative: 0 %
Eosinophils Absolute: 0.3 10*3/uL (ref 0.0–0.5)
Eosinophils Relative: 2 %
HCT: 25.8 % — ABNORMAL LOW (ref 39.0–52.0)
Hemoglobin: 8.2 g/dL — ABNORMAL LOW (ref 13.0–17.0)
Immature Granulocytes: 1 %
Lymphocytes Relative: 6 %
Lymphs Abs: 0.8 10*3/uL (ref 0.7–4.0)
MCH: 27.3 pg (ref 26.0–34.0)
MCHC: 31.8 g/dL (ref 30.0–36.0)
MCV: 86 fL (ref 80.0–100.0)
Monocytes Absolute: 0.7 10*3/uL (ref 0.1–1.0)
Monocytes Relative: 6 %
Neutro Abs: 10.4 10*3/uL — ABNORMAL HIGH (ref 1.7–7.7)
Neutrophils Relative %: 85 %
Platelets: 569 10*3/uL — ABNORMAL HIGH (ref 150–400)
RBC: 3 MIL/uL — ABNORMAL LOW (ref 4.22–5.81)
RDW: 15.4 % (ref 11.5–15.5)
WBC: 12.3 10*3/uL — ABNORMAL HIGH (ref 4.0–10.5)
nRBC: 0 % (ref 0.0–0.2)

## 2021-08-16 LAB — GLUCOSE, CAPILLARY
Glucose-Capillary: 128 mg/dL — ABNORMAL HIGH (ref 70–99)
Glucose-Capillary: 132 mg/dL — ABNORMAL HIGH (ref 70–99)
Glucose-Capillary: 133 mg/dL — ABNORMAL HIGH (ref 70–99)
Glucose-Capillary: 141 mg/dL — ABNORMAL HIGH (ref 70–99)
Glucose-Capillary: 143 mg/dL — ABNORMAL HIGH (ref 70–99)
Glucose-Capillary: 163 mg/dL — ABNORMAL HIGH (ref 70–99)
Glucose-Capillary: 192 mg/dL — ABNORMAL HIGH (ref 70–99)
Glucose-Capillary: 90 mg/dL (ref 70–99)

## 2021-08-16 LAB — BASIC METABOLIC PANEL
Anion gap: 7 (ref 5–15)
BUN: 19 mg/dL (ref 8–23)
CO2: 25 mmol/L (ref 22–32)
Calcium: 8.3 mg/dL — ABNORMAL LOW (ref 8.9–10.3)
Chloride: 100 mmol/L (ref 98–111)
Creatinine, Ser: 0.87 mg/dL (ref 0.61–1.24)
GFR, Estimated: >60 mL/min (ref >60)
Glucose, Bld: 128 mg/dL — ABNORMAL HIGH (ref 70–99)
Potassium: 4.1 mmol/L (ref 3.5–5.1)
Sodium: 132 mmol/L — ABNORMAL LOW (ref 135–145)

## 2021-08-16 LAB — FOLATE: Folate: 14.1 ng/mL (ref >5.9)

## 2021-08-16 LAB — FERRITIN: Ferritin: 181 ng/mL (ref 24–336)

## 2021-08-16 LAB — VITAMIN B12: Vitamin B-12: 609 pg/mL (ref 180–914)

## 2021-08-16 MED ORDER — TESTOSTERONE 50 MG/5GM (1%) TD GEL
10.0000 g | Freq: Every day | TRANSDERMAL | Status: DC
Start: 2021-08-17 — End: 2021-08-19
  Administered 2021-08-17 – 2021-08-19 (×3): 10 g via TRANSDERMAL
  Filled 2021-08-16 (×3): qty 10

## 2021-08-16 MED ORDER — FERROUS SULFATE 220 (44 FE) MG/5ML PO ELIX
220.0000 mg | ORAL_SOLUTION | ORAL | Status: DC
Start: 2021-08-16 — End: 2021-08-19
  Administered 2021-08-16 – 2021-08-18 (×2): 220 mg
  Filled 2021-08-16 (×2): qty 5

## 2021-08-16 MED ORDER — TESTOSTERONE 20.25 MG/ACT (1.62%) TD GEL: 4.0000 | Freq: Every day | TRANSDERMAL | Status: DC

## 2021-08-16 NOTE — Progress Notes (Signed)
PROGRESS NOTE    William Barron  JSE:[id number] DOB: [birth year] DOA: 08/11/2021 PCP: [name], MD  Chief Complaint  Patient presents with   Needs SNF placement    Brief Narrative:  William Barron is William Barron 76 y.o. male with medical history significant of pancreatic mass status post Whipple and J/G tube April-May 2023, multiple myeloma, prostate cancer status post radiation therapy, HTN, HLD, CAD, amyloidosis, IDDM who was recently discharged from [name] 5/30 after prolonged hospital stay after whipple procedure.  According to [name]'s documentation, patient has strong family history of pancreatic cancer, decision was made for resection of pancreas.  Whipple was performed and postop pathology extensive involvement by amyloidosis (no definite evidence of IPMN or mucinous cystic neoplasm component is identified) (07/13/2021 pathology - previous note had mentioned pathology showing IPMN, but I don't see this). After surgery, patient was on TPN briefly then JG tube inserted by [name]. He arrived at [facility] on 5/30 and they were under the impression they could not handle his GJ.  They did not receive his discharge summary until he was en route, per my discussion with thme.  It sounds like they thought he needed continuous suction (though per discharge summary, it was noted that g port could be placed to suction for nausea as needed, but it was mostly to gravity).  They initially asked to go back to [name], but EMS wouldn't take them there.  Due to his continued intractable hiccups and need for care and the belief they were unable to manage him at [facility], he came to the [facility] ED.      Assessment & Plan:   Principal Problem:   H/O Whipple procedure Active Problems:   Intractable hiccups   Candida esophagitis (HCC)   Facial droop   Diabetes (HCC)   Anemia   Hypoalbuminemia   Frequent PVCs   Prolonged QT interval   Amyloidosis (HCC)   Multiple myeloma (HCC)   Asymptomatic  bacteriuria   Prostate cancer (HCC)   Hormone replacement therapy   Lesion of parotid gland   Protein-calorie malnutrition, severe (HCC)   Feeding difficulties   Feeding tube dysfunction, initial encounter   Assessment and Plan: * H/O Whipple procedure Seems like some confusion regarding need for suction with the GJ -> ultimately led to him being admitted here He's s/p open whipple on 5/1 (care everywhere documentation confusing regarding dates of surgery) Post op course complicated by delirium, UTI, recurrent/continued need for NG placement GJ placed on 5/18 At the time of discharge, had continuous feeds via j port and g port was placed gravity mostly (occasionally placed to suction to alleviate nausea and drain stomach) Clear liquids for comfort (pt son had talked to Dr. Hyman Hopes regarding this) Per dc instructions, ok to hook g port to suction of patient bc distended, nauseated or with significant hiccups, should be on continuous j tube feeds    Intractable hiccups Per my discussion with [name] from [place], intractable hiccups were part of the reason [facility] Will stop thorazine and trial gabapentin  Follow, as above, can hook g port to suction as needed for distension or hiccups  Facial droop Mild L facial droop, he'd complained of confusion last night and was concerned he might be having William Barron stroke.  May have been related to thorazine? MRI brain without acute intracranial path  Candida esophagitis ([place]) Plan for 14 days fluconazole per [name] d/c summary He was discharged with 10 days worth on  5/30, should be complete 6/9  Diabetes ([place]) semglee 6 units with SSI q4  Anemia Trend Low iron, sat ratios, normal ferritin, folate, b12.  Iron def, aocd? 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 Surgical pathology from 5/1 with hepatic artery LN involved by amyloid, gallbladder involved by amyloid, pancreas, portion of stomach, and duodenum with extensive involvement by amyloidosis, 13 lymph nodes involved by amyloidosis, negative for tumor (see report in care everywhere)  Asymptomatic bacteriuria ESBL klebsiella and citrobacter freundii  Currently asymptomatic Will follow for sx   Multiple myeloma ([place]) ixazomib is on d/c meds from [facility] [place] Also on 4 mg dexamethazone once weekly  Holding at this time (he's not currently taking these meds)  Doctor is at [facility]) S/p radiation Will follow with pt/wife  Hormone replacement therapy Resume home testosterone  Lesion of parotid gland Needs ENT follow up outpatient  Concerning for parotid neoplasm  Protein-calorie malnutrition, severe ([place]) Tube feeds RD c/s      DVT prophylaxis: lovenox Code Status: full Family Communication: none Disposition:   Status is: Inpatient Remains inpatient appropriate because: pending safe discharge plan - stable for discharge - looking into CIR, etc   Consultants:  none  Procedures:  none  Antimicrobials:  Anti-infectives (From admission, onward)    Start     Dose/Rate Route Frequency Ordered Stop   08/15/21 1000  fluconazole (DIFLUCAN) tablet 200 mg  Status:  Discontinued        200 mg Per Tube Daily 08/14/21 1106 08/14/21 1230   08/14/21 1230  fluconazole (DIFLUCAN) tablet 200 mg        200 mg Per Tube Daily 08/14/21 1230 08/21/21 0959   08/12/21 1348  fluconazole (DIFLUCAN) 40 MG/ML suspension 200 mg  Status:  Discontinued       Note to Pharmacy: Qd x 10 days     200 mg Per Tube See admin instructions 08/12/21 1352 08/12/21 1354   08/12/21 1000  fluconazole (DIFLUCAN) tablet 200 mg  Status:  Discontinued        200 mg Oral Daily 08/12/21 0152 08/14/21 1106       Subjective: No new complaints Asking to shower Asking for  testosterone  Objective: Vitals:   08/15/21 2032 08/16/21 0439 08/16/21 0500 08/16/21 0851  BP: (!) 111/57 105/62  (!) 112/49  Pulse: 88 80  (!) 106  Resp: '18 16  18  ' Temp: 98.7 F (37.1 C) 98 F (36.7 C)  98.1 F (36.7 C)  TempSrc: Oral Oral  Oral  SpO2: 99% 96%  99%  Weight:   56.1 kg   Height:        Intake/Output Summary (Last 24 hours) at 08/16/2021 1355 Last data filed at 08/16/2021 1100 Gross per 24 hour  Intake 2063.83 ml  Output 1650 ml  Net 413.83 ml   Filed Weights   08/14/21 0600 08/15/21 0438 08/16/21 0500  Weight: 54.2 kg 57.6 kg 56.1 kg    Examination:  General: No acute distress. Cardiovascular: RRR Lungs: unlabored Neurological: Alert and oriented 3. Moves all extremities 4. Cranial nerves II through XII grossly intact. Extremities: No clubbing or cyanosis. No edema.   Data Reviewed: I have personally reviewed following labs and imaging studies  CBC: Recent Labs  Lab 08/11/21 1900 08/15/21 0130  08/16/21 0052  WBC 8.4 10.9* 12.3*  NEUTROABS 7.0 8.7* 10.4*  HGB 8.1* 8.5* 8.2*  HCT 26.8* 26.3* 25.8*  MCV 90.5 85.9 86.0  PLT 422* 523* 569*    Basic Metabolic Panel: Recent Labs  Lab 08/11/21 1900 08/12/21 1921 08/13/21 0604 08/15/21 0130 08/16/21 0052  NA 135  --  132* 134* 132*  K 3.9  --  4.6 4.0 4.1  CL 107  --  99 100 100  CO2 20*  --  '25 29 25  ' GLUCOSE 112*  --  215* 98 128*  BUN 15  --  '15 16 19  ' CREATININE 0.71  --  0.95 1.00 0.87  CALCIUM 7.5*  --  8.5* 8.3* 8.3*  MG  --  2.0  --  1.8  --   PHOS  --   --   --  3.4  --     GFR: Estimated Creatinine Clearance: 55.5 mL/min (by C-G formula based on SCr of 0.87 mg/dL).  Liver Function Tests: Recent Labs  Lab 08/11/21 1900 08/15/21 0130  AST 24 30  ALT 27 42  ALKPHOS 406* 626*  BILITOT 0.1* 0.4  PROT 5.1* 5.4*  ALBUMIN 2.1* 2.3*    CBG: Recent Labs  Lab 08/15/21 2034 08/16/21 0035 08/16/21 0440 08/16/21 0818 08/16/21 1241  GLUCAP 102* 132* 133* 192* 163*      Recent Results (from the past 240 hour(s))  Urine Culture     Status: Abnormal   Collection Time: 08/11/21  8:53 PM   Specimen: Urine, Clean Catch  Result Value Ref Range Status   Specimen Description URINE, CLEAN CATCH  Final   Special Requests   Final    NONE Performed at [facility], [place] [address] [place], [place] [address]    Culture (Harrison Zetina)  Final    70,000 COLONIES/mL KLEBSIELLA PNEUMONIAE Confirmed Extended Spectrum Beta-Lactamase Producer (ESBL).  In bloodstream infections from ESBL organisms, carbapenems are preferred over piperacillin/tazobactam. They are shown to have Kathia Covington lower risk of mortality. 20,000 COLONIES/mL CITROBACTER FREUNDII    Report Status 08/15/2021 FINAL  Final   Organism ID, Bacteria KLEBSIELLA PNEUMONIAE (Olla Delancey)  Final   Organism ID, Bacteria CITROBACTER FREUNDII (Doyle Tegethoff)  Final      Susceptibility   Citrobacter freundii - MIC*    CEFAZOLIN >=64 RESISTANT Resistant     CEFEPIME <=0.12 SENSITIVE Sensitive     CEFTRIAXONE <=0.25 SENSITIVE Sensitive     CIPROFLOXACIN <=0.25 SENSITIVE Sensitive     GENTAMICIN <=1 SENSITIVE Sensitive     IMIPENEM 0.5 SENSITIVE Sensitive     NITROFURANTOIN 32 SENSITIVE Sensitive     TRIMETH/SULFA <=20 SENSITIVE Sensitive     PIP/TAZO 8 SENSITIVE Sensitive     * 20,000 COLONIES/mL CITROBACTER FREUNDII   Klebsiella pneumoniae - MIC*    AMPICILLIN >=32 RESISTANT Resistant     CEFAZOLIN >=64 RESISTANT Resistant     CEFEPIME >=32 RESISTANT Resistant     CEFTRIAXONE >=64 RESISTANT Resistant     CIPROFLOXACIN 2 RESISTANT Resistant     GENTAMICIN >=16 RESISTANT Resistant     IMIPENEM 0.5 SENSITIVE Sensitive     NITROFURANTOIN 64 INTERMEDIATE Intermediate     TRIMETH/SULFA >=320 RESISTANT Resistant     AMPICILLIN/SULBACTAM >=32 RESISTANT Resistant     PIP/TAZO 16 SENSITIVE Sensitive     * 70,000 COLONIES/mL KLEBSIELLA PNEUMONIAE  MRSA Next Gen by PCR, Nasal     Status: None   Collection Time: 08/13/21 10:50 AM   Specimen:  Nasal Mucosa;  Nasal Swab  Result Value Ref Range Status   MRSA by PCR Next Gen NOT DETECTED NOT DETECTED Final    Comment: (NOTE) The GeneXpert MRSA Assay (FDA approved for NASAL specimens only), is one component of Edona Schreffler comprehensive MRSA colonization surveillance program. It is not intended to diagnose MRSA infection nor to guide or monitor treatment for MRSA infections. Test performance is not FDA approved in patients less than 69 years old. Performed at [facility], [place] [address] [place], [place] [address]          Radiology Studies: No results found.      Scheduled Meds:  chlorhexidine  15 mL Mouth Rinse BID   enoxaparin (LOVENOX) injection  40 mg Subcutaneous Q24H   fluconazole  200 mg Per Tube Daily   gabapentin  100 mg Per Tube Q8H   insulin aspart  0-9 Units Subcutaneous Q4H   insulin glargine-yfgn  6 Units Subcutaneous Daily   mouth rinse  15 mL Mouth Rinse q12n4p   melatonin  6 mg Per Tube QHS   metoprolol tartrate  12.5 mg Per J Tube BID   pantoprazole sodium  40 mg Per Tube Daily   Testosterone  4 Pump Topical Daily   Continuous Infusions:  feeding supplement (GLUCERNA 1.5 CAL) 1,000 mL (08/16/21 0429)     LOS: 3 days    Time spent: over 30 min    Fayrene Helper, MD [facility]   To contact the attending provider between 7A-7P or the covering provider during after hours 7P-7A, please log into the web site [email or web address] and access using universal [place] password for that web site. If you do not have the password, please call the hospital operator.  08/16/2021, 1:55 PM

## 2021-08-16 NOTE — Plan of Care (Signed)
  Problem: Nutrition: Goal: Adequate nutrition will be maintained Outcome: Not Progressing   Problem: Activity: Goal: Risk for activity intolerance will decrease Outcome: Not Progressing   Problem: Elimination: Goal: Will not experience complications related to bowel motility Outcome: Not Progressing

## 2021-08-16 NOTE — Progress Notes (Signed)
Mobility Specialist Progress Note   08/16/21 1840  Mobility  Activity Ambulated with assistance to bathroom;Transferred from chair to bed  Level of Assistance +2 (takes two people)  Assistive Device Stedy  Activity Response Tolerated well  $Mobility charge 1 Mobility   Pt finished w/ shower and requesting to get back to chair. Required +2 minA for safety/equipment d/t general weakness + verbal cues to emphasize hip extension in stedy. Tx'd w/o fault and Pt left in chair with RN present.  Holland Falling Mobility Specialist Phone Number [phone]

## 2021-08-16 NOTE — Progress Notes (Signed)
Mobility Specialist Progress Note   08/16/21 1800  Mobility  Activity Ambulated with assistance to bathroom (stedy. pt was moved to chair after shower.)  Level of Assistance +2 (takes two people)  Assistive Device Stedy  Activity Response Tolerated well  $Mobility charge 1 Mobility   Pt requesting to get from chair to BR in order to take a shower. Required +2 minA for safety/equipment d/t general weakness + verbal cues to emphasize hip extension in stedy. Tx'd w/o fault and Pt left in BR with RN present.  Holland Falling Mobility Specialist Phone Number [phone]

## 2021-08-17 DIAGNOSIS — Z90410 Acquired total absence of pancreas: Secondary | ICD-10-CM | POA: Diagnosis not present

## 2021-08-17 DIAGNOSIS — Z9049 Acquired absence of other specified parts of digestive tract: Secondary | ICD-10-CM | POA: Diagnosis not present

## 2021-08-17 LAB — CBC WITH DIFFERENTIAL/PLATELET
Abs Immature Granulocytes: 0.09 10*3/uL — ABNORMAL HIGH (ref 0.00–0.07)
Basophils Absolute: 0.1 10*3/uL (ref 0.0–0.1)
Basophils Relative: 1 %
Eosinophils Absolute: 0.2 10*3/uL (ref 0.0–0.5)
Eosinophils Relative: 2 %
HCT: 27.4 % — ABNORMAL LOW (ref 39.0–52.0)
Hemoglobin: 8.6 g/dL — ABNORMAL LOW (ref 13.0–17.0)
Immature Granulocytes: 1 %
Lymphocytes Relative: 8 %
Lymphs Abs: 0.8 10*3/uL (ref 0.7–4.0)
MCH: 27 pg (ref 26.0–34.0)
MCHC: 31.4 g/dL (ref 30.0–36.0)
MCV: 86.2 fL (ref 80.0–100.0)
Monocytes Absolute: 0.7 10*3/uL (ref 0.1–1.0)
Monocytes Relative: 7 %
Neutro Abs: 8.2 10*3/uL — ABNORMAL HIGH (ref 1.7–7.7)
Neutrophils Relative %: 81 %
Platelets: 575 10*3/uL — ABNORMAL HIGH (ref 150–400)
RBC: 3.18 MIL/uL — ABNORMAL LOW (ref 4.22–5.81)
RDW: 15.3 % (ref 11.5–15.5)
WBC: 10.1 10*3/uL (ref 4.0–10.5)
nRBC: 0 % (ref 0.0–0.2)

## 2021-08-17 LAB — COMPREHENSIVE METABOLIC PANEL
ALT: 40 U/L (ref 0–44)
AST: 26 U/L (ref 15–41)
Albumin: 2.4 g/dL — ABNORMAL LOW (ref 3.5–5.0)
Alkaline Phosphatase: 595 U/L — ABNORMAL HIGH (ref 38–126)
Anion gap: 10 (ref 5–15)
BUN: 21 mg/dL (ref 8–23)
CO2: 25 mmol/L (ref 22–32)
Calcium: 8.5 mg/dL — ABNORMAL LOW (ref 8.9–10.3)
Chloride: 101 mmol/L (ref 98–111)
Creatinine, Ser: 0.86 mg/dL (ref 0.61–1.24)
GFR, Estimated: >60 mL/min (ref >60)
Glucose, Bld: 117 mg/dL — ABNORMAL HIGH (ref 70–99)
Potassium: 4.1 mmol/L (ref 3.5–5.1)
Sodium: 136 mmol/L (ref 135–145)
Total Bilirubin: 0.5 mg/dL (ref 0.3–1.2)
Total Protein: 5.5 g/dL — ABNORMAL LOW (ref 6.5–8.1)

## 2021-08-17 LAB — MAGNESIUM: Magnesium: 1.8 mg/dL (ref 1.7–2.4)

## 2021-08-17 LAB — GLUCOSE, CAPILLARY
Glucose-Capillary: 116 mg/dL — ABNORMAL HIGH (ref 70–99)
Glucose-Capillary: 168 mg/dL — ABNORMAL HIGH (ref 70–99)
Glucose-Capillary: 202 mg/dL — ABNORMAL HIGH (ref 70–99)
Glucose-Capillary: 198 mg/dL — ABNORMAL HIGH (ref 70–99)
Glucose-Capillary: 161 mg/dL — ABNORMAL HIGH (ref 70–99)

## 2021-08-17 LAB — PHOSPHORUS: Phosphorus: 4.1 mg/dL (ref 2.5–4.6)

## 2021-08-17 NOTE — Progress Notes (Signed)
Pt asking for prescription refills for Androgel at home. Pt states he gets refills through [place] sent to his house. I instructed pt and son at bedside that they would need to call pt's primary [facility] to request refills.

## 2021-08-17 NOTE — Progress Notes (Signed)
Occupational Therapy Treatment Patient Details Name: William Barron MRN: [id number] DOB: Jun 27, 1945 Today's Date: 08/17/2021   History of present illness Pt is a 76 yr old M admitted on 08/11/21 from SNF.  Pt has been at [facility] since having Whipple on 5/1, transferred to SNF who immediately reported inability to care for pt.  Imaging (+) for R parotid gland lesion, suspicious of neoplasm.  PMH: DM, multiple myeloma, tremors, prostate CA, HTN   OT comments  Patient able to get to EOB without assistance and was able to donn socks and shoes. Patient performed mobility with RW and his son following with chair. Patient required min assist to stand and mod assist for balance. Patient ambulated into bathroom for toilet transfer and required assistance for toilet hygiene. Patient is motivated towards therapy and is making good progress. Patient is good candidate for AIR for more aggressive rehab. Acute OT to continue to follow.    Recommendations for follow up therapy are one component of a multi-disciplinary discharge planning process, led by the attending physician.  Recommendations may be updated based on patient status, additional functional criteria and insurance authorization.    Follow Up Recommendations  Acute inpatient rehab (3hours/day)    Assistance Recommended at Discharge Frequent or constant Supervision/Assistance  Patient can return home with the following  A lot of help with walking and/or transfers;A lot of help with bathing/dressing/bathroom;Assistance with cooking/housework;Direct supervision/assist for medications management;Direct supervision/assist for financial management;Assist for transportation;Help with stairs or ramp for entrance   Equipment Recommendations  Other (comment)    Recommendations for Other Services      Precautions / Restrictions Precautions Precautions: Fall Precaution Comments: GJ tube Restrictions Weight Bearing Restrictions: No       Mobility Bed  Mobility Overal bed mobility: Modified Independent             General bed mobility comments: able to get to EOB without assistance    Transfers Overall transfer level: Needs assistance Equipment used: Rolling walker (2 wheels) Transfers: Sit to/from Stand, Bed to chair/wheelchair/BSC Sit to Stand: Min assist Stand pivot transfers: Mod assist         General transfer comment: min assist to stand with patient pushing up with one extremity, mod assist to pivot     Balance Overall balance assessment: Needs assistance Sitting-balance support: Feet supported, Single extremity supported Sitting balance-Leahy Scale: Fair     Standing balance support: Bilateral upper extremity supported, During functional activity, Reliant on assistive device for balance Standing balance-Leahy Scale: Fair Standing balance comment: reliant on RW for support                           ADL either performed or assessed with clinical judgement   ADL Overall ADL's : Needs assistance/impaired     Grooming: Brushing hair;Sitting;Set up               Lower Body Dressing: Set up;Sitting/lateral leans Lower Body Dressing Details (indicate cue type and reason): able to cross legs to donn socks and shoes Toilet Transfer: Moderate assistance;Regular Glass blower/designer Details (indicate cue type and reason): ambulated into bathroom and required mod assist for balance and cues for safety Toileting- Clothing Manipulation and Hygiene: Moderate assistance;Sit to/from stand Toileting - Clothing Manipulation Details (indicate cue type and reason): toilet hygiene performed while standing with assistance for hygiene       General ADL Comments: increased activity tolerance but continues to require cues for  breathing technique    Extremity/Trunk Assessment              Vision       Perception     Praxis      Cognition Arousal/Alertness: Awake/alert Behavior During Therapy: WFL  for tasks assessed/performed Overall Cognitive Status: Within Functional Limits for tasks assessed Area of Impairment: Orientation, Attention, Memory, Following commands, Safety/judgement, Awareness, Problem solving                   Current Attention Level: Sustained Memory: Decreased recall of precautions Following Commands: Follows one step commands with increased time, Follows multi-step commands inconsistently Safety/Judgement: Decreased awareness of safety Awareness: Emergent Problem Solving: Slow processing, Decreased initiation, Difficulty sequencing, Requires verbal cues, Requires tactile cues General Comments: eager to participate        Exercises      Shoulder Instructions       General Comments performed functional mobility with RW    Pertinent Vitals/ Pain       Pain Assessment Pain Assessment: Faces Faces Pain Scale: Hurts a little bit Pain Location: generalized with movement Pain Descriptors / Indicators: Grimacing, Guarding Pain Intervention(s): Monitored during session  Home Living                                          Prior Functioning/Environment              Frequency  Min 2X/week        Progress Toward Goals  OT Goals(current goals can now be found in the care plan section)  Progress towards OT goals: Progressing toward goals  Acute Rehab OT Goals Patient Stated Goal: go to rehab OT Goal Formulation: With patient/family Time For Goal Achievement: 08/28/21 Potential to Achieve Goals: Good ADL Goals Pt Will Perform Lower Body Bathing: with set-up;sitting/lateral leans;sit to/from stand;with adaptive equipment Pt Will Perform Lower Body Dressing: with set-up;with adaptive equipment;sitting/lateral leans;sit to/from stand Pt Will Transfer to Toilet: with set-up;ambulating;grab bars Pt Will Perform Toileting - Clothing Manipulation and hygiene: with set-up;sit to/from stand;sitting/lateral leans Additional ADL  Goal #1: Patient will demonstrate increased activity tolerance to complete functional task in standing for 2-3 minutes without need for seated rest break to promote increased ability at discharge.  Plan Discharge plan remains appropriate    Co-evaluation                 AM-PAC OT "6 Clicks" Daily Activity     Outcome Measure   Help from another person eating meals?: A Little Help from another person taking care of personal grooming?: A Little Help from another person toileting, which includes using toliet, bedpan, or urinal?: A Lot Help from another person bathing (including washing, rinsing, drying)?: A Lot Help from another person to put on and taking off regular upper body clothing?: A Little Help from another person to put on and taking off regular lower body clothing?: A Lot 6 Click Score: 15    End of Session Equipment Utilized During Treatment: Gait belt;Rolling walker (2 wheels)  OT Visit Diagnosis: Unsteadiness on feet (R26.81);Other abnormalities of gait and mobility (R26.89);Repeated falls (R29.6);Muscle weakness (generalized) (M62.81);History of falling (Z91.81)   Activity Tolerance Patient tolerated treatment well   Patient Left in chair;with call bell/phone within reach   Nurse Communication Mobility status        Time: 0539-7673 OT Time Calculation (min):  38 min  Charges: OT General Charges $OT Visit: 1 Visit OT Treatments $Self Care/Home Management : 23-37 mins $Therapeutic Activity: 8-22 mins  Lodema Hong, [place]  Pager [phone] Office [place] 08/17/2021, 11:57 AM

## 2021-08-17 NOTE — Progress Notes (Incomplete)
Nutrition Follow-up  DOCUMENTATION CODES:  Non-severe (moderate) malnutrition in context of chronic illness  INTERVENTION:  Continue current TF regimen: Glucerna 1.5 @ 46m/h (15626md) Free water flush of 14081m4h per MD This provides 2340kcal, 129g of protein, and 2092m62m free water (TF+flush) Continue current diet per pt request for comfort  NUTRITION DIAGNOSIS:  Severe Malnutrition (in the context of chronic illness) related to cancer and cancer related treatments (prostate in 2022 s/p radiation, amyloidosis and multiple myeloma) as evidenced by severe fat depletion, severe muscle depletion, percent weight loss (11% x 6 months). - Remains applicable  GOAL:  Patient will meet greater than or equal to 90% of their needs - meeting via TF  MONITOR:  TF tolerance, Weight trends  REASON FOR ASSESSMENT:  Consult Enteral/tube feeding initiation and management  ASSESSMENT:  Pt with extensive medical hx (HLD, HTN, CAD, amyloidosis and multiple myeloma, GERD, hx prostate cancer, DM type 2) [facility] ED [facility] nursing [facility] at the time of assessment. TF running at goal. Pt reports that they are well tolerated, denies GI distress or bloating. Also able to take in clears without issue. Pt has no questions at this time.  CM still working on a place for pt to dc to. Medically stable to go to facility when appropriate plan is identified.    Nutritionally Relevant Medications: Scheduled Meds:  ferrous sulfate  220 mg Per Tube QODAY   insulin aspart  0-9 Units Subcutaneous Q4H   insulin glargine-yfgn  6 Units Subcutaneous Daily   pantoprazole sodium  40 mg Per Tube Daily   Continuous Infusions:  GLUCERNA 1.5 CAL 65 mL/hr at 08/17/21 1800   Labs Reviewed  G-tube output: 400mL47mr the last 24 hours  Current TF regimen: Glucerna 1.5 @ 65mL/55m560mL/d6mee water flush of 140mL q459mr MD  NUTRITION - FOCUSED  PHYSICAL EXAM: Flowsheet Row Most Recent Value  Orbital Region Severe depletion  Upper Arm Region Moderate depletion  Thoracic and Lumbar Region Severe depletion  Buccal Region Severe depletion  Temple Region Moderate depletion  Clavicle Bone Region Severe depletion  Clavicle and Acromion Bone Region Severe depletion  Scapular Bone Region Severe depletion  Dorsal Hand Moderate depletion  Patellar Region Severe depletion  Anterior Thigh Region Severe depletion  Posterior Calf Region Severe depletion  Edema (RD Assessment) None  Hair Reviewed  Eyes Reviewed  Mouth Reviewed  Skin Reviewed  Nails Reviewed   Diet Order:   Diet Order             Diet clear liquid Room service appropriate? Yes; Fluid consistency: Thin  Diet effective now                   EDUCATION NEEDS:  Education needs have been addressed  Skin:  Skin Assessment: Reviewed RN Assessment  Last BM:  6/6 - type 5  Height:  Ht Readings from Last 1 Encounters:  08/12/21 '5\' 6"'  (1.676 m)    Weight:  Wt Readings from Last 1 Encounters:  08/16/21 56.1 kg    Ideal Body Weight:  64.5 kg  BMI:  Body mass index is 19.96 kg/m.  Estimated Nutritional Needs:  Kcal:  2200-2500 kcal/d Protein:  110-125 g/d Fluid:  2.2-2.5L/d   Myshawn Chir[place]oga HRanell PatrickN Clinical Dietitian RD pager # available in AMION  After hours/weekend pager # available in [facility]

## 2021-08-17 NOTE — TOC Progression Note (Signed)
Transition of Care [facility]) - Progression Note    Patient Details  Name: William Barron MRN: [id number] Date of Birth: Aug 07, 1945  Transition of Care [facility]) CM/SW Contact  Reece Agar, [place] Phone Number: 08/17/2021, 12:06 PM  Clinical Narrative:    CSW spoke with Whitney at [facility] about bed availability, she does not have any beds until Wednesday. CSW went to pt room to discuss beds with pt son. Pt son was not int he room, CSW will follow up via phone.  CSW contacted pt son via phone, no answer CSW left a VM to provide update on bed availability and possible CIR availability.    Expected Discharge Plan: [place] Barriers to Discharge: Continued Medical Work up  Expected Discharge Plan and Services Expected Discharge Plan: [place]   Discharge Planning Services: CM Consult Post Acute Care Choice: Long Term Acute Care (LTAC), [place] Living arrangements for the past 2 months: Single Family Home                   DME Agency: NA       HH Arranged: NA           Social Determinants of Health (SDOH) Interventions    Readmission Risk Interventions     View : No data to display.

## 2021-08-17 NOTE — Progress Notes (Addendum)
PROGRESS NOTE    William Barron  RFF:[id number] DOB: March 08, 1946 DOA: 08/11/2021 PCP: William Hatchet, MD  Chief Complaint  Patient presents with   Needs SNF placement    Brief Narrative:  William Barron is William Barron 76 y.o. male with medical history significant of pancreatic mass status post Whipple and J/G tube April-May 2023, multiple myeloma, prostate cancer status post radiation therapy, HTN, HLD, CAD, amyloidosis, IDDM who was recently discharged from [name] 5/30 after prolonged hospital stay after whipple procedure.  According to [name]'s documentation, patient has strong family history of pancreatic cancer, decision was made for resection of pancreas.  Whipple was performed and postop pathology extensive involvement by amyloidosis (no definite evidence of IPMN or mucinous cystic neoplasm component is identified) (07/13/2021 pathology - previous note had mentioned pathology showing IPMN, but I don't see this). After surgery, patient was on TPN briefly then JG tube inserted by [name] services. He arrived at [facility] on 5/30 and they were under the impression they could not handle his GJ.  They did not receive his discharge summary until he was en route, per my discussion with thme.  It sounds like they thought he needed continuous suction (though per discharge summary, it was noted that g port could be placed to suction for nausea as needed, but it was mostly to gravity).  They initially asked to go back to [name], but EMS wouldn't take them there.  Due to his continued intractable hiccups and need for care and the belief they were unable to manage him at [facility], he came to the [facility] ED.      Assessment & Plan:   Principal Problem:   H/O Whipple procedure Active Problems:   Intractable hiccups   Candida esophagitis (HCC)   Facial droop   Diabetes (HCC)   Anemia   Hypoalbuminemia   Frequent PVCs   Prolonged QT interval   Amyloidosis (HCC)   Multiple myeloma (HCC)   Asymptomatic  bacteriuria   Prostate cancer (HCC)   Hormone replacement therapy   Lesion of parotid gland   Protein-calorie malnutrition, severe (HCC)   Feeding difficulties   Feeding tube dysfunction, initial encounter   Assessment and Plan: * H/O Whipple procedure Seems like some confusion regarding need for suction with the GJ -> ultimately led to him being admitted here He's s/p open whipple on 5/1 (care everywhere documentation confusing regarding dates of surgery) Post op course complicated by delirium, UTI, recurrent/continued need for NG placement GJ placed on 5/18 At the time of discharge, had continuous feeds via j port and g port was placed gravity mostly (occasionally placed to suction to alleviate nausea and drain stomach) Clear liquids for comfort (pt son had talked to Dr. Hyman Barron regarding this) Per dc instructions, ok to hook g port to suction of patient bc distended, nauseated or with significant hiccups, should be on continuous j tube feeds    Intractable hiccups Per my discussion with William Barron from [place], intractable hiccups were part of the reason he came [facility] the ED [facility] Will stop thorazine and trial gabapentin  Follow, as above, can hook g port to suction as needed for distension or hiccups  Facial droop Mild L facial droop, he'd complained of confusion on the day of admission.  May have been related to thorazine? MRI brain without acute intracranial path  Candida esophagitis (William Barron) Plan for 14 days fluconazole per [name] d/c summary He was discharged with 10 days worth on 5/30, should be complete 6/9  Diabetes (William Barron) semglee 6 units with SSI q4  Anemia Trend Low iron, sat ratios, normal ferritin, folate, b12.  Iron def, aocd? 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 Surgical pathology from 5/1 with hepatic artery LN involved by amyloid, gallbladder involved by amyloid, pancreas, portion of stomach, and duodenum with extensive involvement by amyloidosis, 13 lymph nodes involved by amyloidosis, negative for tumor (see report in care everywhere)  Asymptomatic bacteriuria ESBL klebsiella and citrobacter freundii  Currently asymptomatic Will follow for sx   Multiple myeloma (William Barron) ixazomib is on d/c meds from [facility] Also on 4 mg dexamethazone once weekly  Holding at this time (he's not currently taking these meds)  Doctor is at [facility]) S/p radiation Will follow with pt/wife  Hormone replacement therapy Resume home testosterone  Lesion of parotid gland Needs ENT follow up outpatient  Concerning for parotid neoplasm  Protein-calorie malnutrition, severe (William Barron) Tube feeds RD c/s      DVT prophylaxis: lovenox Code Status: full Family Communication: none - called son, no answer Disposition:   Status is: Inpatient Remains inpatient appropriate because: pending safe discharge plan - stable for discharge - looking into CIR, etc   Consultants:  none  Procedures:  none  Antimicrobials:  Anti-infectives (From admission, onward)    Start     Dose/Rate Route Frequency Ordered Stop   08/15/21 1000  fluconazole (DIFLUCAN) tablet 200 mg  Status:  Discontinued        200 mg Per Tube Daily 08/14/21 1106 08/14/21 1230   08/14/21 1230  fluconazole (DIFLUCAN) tablet 200 mg        200 mg Per Tube Daily 08/14/21 1230 08/21/21 0959   08/12/21 1348  fluconazole (DIFLUCAN) 40 MG/ML suspension 200 mg  Status:  Discontinued       Note to Pharmacy: Qd x 10 days     200 mg Per Tube See admin instructions 08/12/21 1352 08/12/21 1354   08/12/21 1000  fluconazole (DIFLUCAN) tablet 200 mg  Status:  Discontinued        200 mg Oral Daily 08/12/21 0152 08/14/21 1106       Subjective: Asking for his testosterone Says he and his  family don't want him to go to [facility]  Objective: Vitals:   08/16/21 1550 08/16/21 2058 08/17/21 0541 08/17/21 0804  BP: (!) 128/55 (!) 119/57 122/63 108/63  Pulse: 85 93 83 83  Resp: _0 Temp: 98.5 F (36.9 C) 99.2 F (37.3 C) 98.7 F (37.1 C) 97.9 F (36.6 C)  TempSrc: Oral Oral Oral Oral  SpO2: 100% 100% 97% 98%  Weight:      Height:        Intake/Output Summary (Last 24 hours) at 08/17/2021 1542 Last data filed at 08/17/2021 1000 Gross per 24 hour  Intake 150 ml  Output 600 ml  Net -450 ml   Filed Weights   08/14/21 0600 08/15/21 0438 08/16/21 0500  Weight: 54.2 kg 57.6 kg 56.1 kg    Examination:  General: No acute distress. Cardiovascular: RRR Lungs: unlabored Neurological: Alert and oriented 3. Moves all extremities 4 with equal strength. Cranial nerves II through XII grossly intact. Extremities: No clubbing or cyanosis. No edema.  Data Reviewed: I have personally reviewed following labs and imaging studies  CBC: Recent Labs  Lab 08/11/21 1900 08/15/21 0130 08/16/21 0052 08/17/21 0310  WBC 8.4 10.9* 12.3* 10.1  NEUTROABS 7.0 8.7* 10.4* 8.2*  HGB 8.1* 8.5* 8.2* 8.6*  HCT 26.8* 26.3* 25.8* 27.4*  MCV 90.5 85.9 86.0 86.2  PLT 422* 523* 569* 575*    Basic Metabolic Panel: Recent Labs  Lab 08/11/21 1900 08/12/21 1921 08/13/21 0604 08/15/21 0130 08/16/21 0052 08/17/21 0310  NA 135  --  132* 134* 132* 136  K 3.9  --  4.6 4.0 4.1 4.1  CL 107  --  99 100 100 101  CO2 20*  --  _0 GLUCOSE 112*  --  215* 98 128* 117*  BUN 15  --  _1 CREATININE 0.71  --  0.95 1.00 0.87 0.86  CALCIUM 7.5*  --  8.5* 8.3* 8.3* 8.5*  MG  --  2.0  --  1.8  --  1.8  PHOS  --   --   --  3.4  --  4.1    GFR: Estimated Creatinine Clearance: 56.2 mL/min (by C-G formula based on SCr of 0.86 mg/dL).  Liver Function Tests: Recent Labs  Lab 08/11/21 1900 08/15/21 0130 08/17/21 0310  AST _2 ALT 27 42 40  ALKPHOS 406* 626* 595*   BILITOT 0.1* 0.4 0.5  PROT 5.1* 5.4* 5.5*  ALBUMIN 2.1* 2.3* 2.4*    CBG: Recent Labs  Lab 08/16/21 2100 08/16/21 2347 08/17/21 0427 08/17/21 0732 08/17/21 1307  GLUCAP 90 128* 116* 168* 202*     Recent Results (from the past 240 hour(s))  Urine Culture     Status: Abnormal   Collection Time: 08/11/21  8:53 PM   Specimen: Urine, Clean Catch  Result Value Ref Range Status   Specimen Description URINE, CLEAN CATCH  Final   Special Requests   Final    NONE Performed at [facility], [place] [address], [place], [place] [address]    Culture (Chaske Paskett)  Final    70,000 COLONIES/mL KLEBSIELLA PNEUMONIAE Confirmed Extended Spectrum Beta-Lactamase Producer (ESBL).  In bloodstream infections from ESBL organisms, carbapenems are preferred over piperacillin/tazobactam. They are shown to have Miki Blank lower risk of mortality. 20,000 COLONIES/mL CITROBACTER FREUNDII    Report Status 08/15/2021 FINAL  Final   Organism ID, Bacteria KLEBSIELLA PNEUMONIAE (Brecken Dewoody)  Final   Organism ID, Bacteria CITROBACTER FREUNDII (Gerad Cornelio)  Final      Susceptibility   Citrobacter freundii - MIC*    CEFAZOLIN >=64 RESISTANT Resistant     CEFEPIME <=0.12 SENSITIVE Sensitive     CEFTRIAXONE <=0.25 SENSITIVE Sensitive     CIPROFLOXACIN <=0.25 SENSITIVE Sensitive     GENTAMICIN <=1 SENSITIVE Sensitive     IMIPENEM 0.5 SENSITIVE Sensitive     NITROFURANTOIN 32 SENSITIVE Sensitive     TRIMETH/SULFA <=20 SENSITIVE Sensitive     PIP/TAZO 8 SENSITIVE Sensitive     * 20,000 COLONIES/mL CITROBACTER FREUNDII   Klebsiella pneumoniae - MIC*    AMPICILLIN >=32 RESISTANT Resistant     CEFAZOLIN >=64 RESISTANT Resistant     CEFEPIME >=32 RESISTANT Resistant     CEFTRIAXONE >=64 RESISTANT Resistant     CIPROFLOXACIN 2 RESISTANT Resistant     GENTAMICIN >=16 RESISTANT Resistant     IMIPENEM 0.5 SENSITIVE Sensitive     NITROFURANTOIN 64 INTERMEDIATE Intermediate     TRIMETH/SULFA >=320 RESISTANT Resistant     AMPICILLIN/SULBACTAM  >=32 RESISTANT Resistant     PIP/TAZO  16 SENSITIVE Sensitive     * 70,000 COLONIES/mL KLEBSIELLA PNEUMONIAE  MRSA Next Gen by PCR, Nasal     Status: None   Collection Time: 08/13/21 10:50 AM   Specimen: Nasal Mucosa; Nasal Swab  Result Value Ref Range Status   MRSA by PCR Next Gen NOT DETECTED NOT DETECTED Final    Comment: (NOTE) The GeneXpert MRSA Assay (FDA approved for NASAL specimens only), is one component of Kycen Spalla comprehensive MRSA colonization surveillance program. It is not intended to diagnose MRSA infection nor to guide or monitor treatment for MRSA infections. Test performance is not FDA approved in patients less than 12 years old. Performed at [facility], [place] [address] [place], [place] [address]          Radiology Studies: No results found.      Scheduled Meds:  chlorhexidine  15 mL Mouth Rinse BID   enoxaparin (LOVENOX) injection  40 mg Subcutaneous Q24H   ferrous sulfate  220 mg Per Tube QODAY   fluconazole  200 mg Per Tube Daily   gabapentin  100 mg Per Tube Q8H   insulin aspart  0-9 Units Subcutaneous Q4H   insulin glargine-yfgn  6 Units Subcutaneous Daily   mouth rinse  15 mL Mouth Rinse q12n4p   melatonin  6 mg Per Tube QHS   metoprolol tartrate  12.5 mg Per J Tube BID   pantoprazole sodium  40 mg Per Tube Daily   testosterone  10 g Transdermal Daily   Continuous Infusions:  feeding supplement (GLUCERNA 1.5 CAL) 1,000 mL (08/17/21 1349)     LOS: 4 days    Time spent: over 30 min    Fayrene Helper, MD [facility]   To contact the attending provider between 7A-7P or the covering provider during after hours 7P-7A, please log into the web site [email or web address] and access using universal [place] password for that web site. If you do not have the password, please call the hospital operator.  08/17/2021, 3:42 PM

## 2021-08-17 NOTE — Progress Notes (Signed)
Physical Therapy Treatment Patient Details Name: William Barron MRN: [id number] DOB: 1945-07-18 Today's Date: 08/17/2021   History of Present Illness Pt is a 76 yr old M admitted on 08/11/21 from SNF.  Pt has been at [facility] since having Whipple on 5/1, transferred to SNF who immediately reported inability to care for pt.  Imaging (+) for R parotid gland lesion, suspicious of neoplasm.  PMH: DM, multiple myeloma, tremors, prostate CA, HTN    PT Comments    Pt received OOB in recliner on arrival with son present, motivated for session and with excellent participation. Pt with increased tolerance for ambulation this session with focus on increased gait velocity and continued swift transition from sitting to standing to walking as pt with static standing tremors along with continued reinforcement on PLB throughout with pt demonstrating fair success. Pt continues to demonstrate poor balance with ataxic gait, heavy reliance on RW, and very close chair follow for safety throughout ambulation and need for +2 assistance for safety with substantially increased seated rest needed between ambulation bouts. Current plan remains appropriate to address deficits and maximize functional independence and decrease caregiver burden. Pt continues to benefit from skilled PT services to progress toward functional mobility goals.    Recommendations for follow up therapy are one component of a multi-disciplinary discharge planning process, led by the attending physician.  Recommendations may be updated based on patient status, additional functional criteria and insurance authorization.  Follow Up Recommendations  Acute inpatient rehab (3hours/day)     Assistance Recommended at Discharge Frequent or constant Supervision/Assistance  Patient can return home with the following A lot of help with walking and/or transfers;A lot of help with bathing/dressing/bathroom;Assist for transportation;Assistance with  cooking/housework;Help with stairs or ramp for entrance   Equipment Recommendations  Rolling walker (2 wheels);BSC/3in1    Recommendations for Other Services       Precautions / Restrictions Precautions Precautions: Fall Precaution Comments: GJ tube Restrictions Weight Bearing Restrictions: No     Mobility  Bed Mobility Overal bed mobility: Modified Independent             General bed mobility comments: OOB in recliner on arrival    Transfers Overall transfer level: Needs assistance Equipment used: Rolling walker (2 wheels) Transfers: Sit to/from Stand, Bed to chair/wheelchair/BSC Sit to Stand: Min assist, Min guard           General transfer comment: min assist down to min guard throughout session STS x3    Ambulation/Gait Ambulation/Gait assistance: +2 safety/equipment, Mod assist Gait Distance (Feet): 93 Feet (39' + 93' + 40') Assistive device: Rolling walker (2 wheels) Gait Pattern/deviations: Step-through pattern, Decreased stride length, Ataxic, Trunk flexed, Narrow base of support       General Gait Details: slow ataxic gait with noted decrease in tremor this session, mod a +2 for very close chair follow for safety, cues throughout for PLB and to breathe generally as pt with tendcy for holding breath.   Stairs             Wheelchair Mobility    Modified Rankin (Stroke Patients Only)       Balance Overall balance assessment: Needs assistance Sitting-balance support: Feet supported, Single extremity supported Sitting balance-Leahy Scale: Fair     Standing balance support: Bilateral upper extremity supported, During functional activity, Reliant on assistive device for balance Standing balance-Leahy Scale: Poor Standing balance comment: reliant on RW for support  Cognition Arousal/Alertness: Awake/alert Behavior During Therapy: WFL for tasks assessed/performed Overall Cognitive Status: Within  Functional Limits for tasks assessed                                 General Comments: eager to participate        Exercises      General Comments General comments (skin integrity, edema, etc.): VSS on RA, noted dyspnea with ambulation, O2 sats >94% throughout ambulation. HEP provided and reviewed with pt and pt son      Pertinent Vitals/Pain Pain Assessment Pain Assessment: Faces Faces Pain Scale: Hurts a little bit Pain Location: abdomen at incision site Pain Descriptors / Indicators: Grimacing, Guarding Pain Intervention(s): Monitored during session, Limited activity within patient's tolerance    Home Living                          Prior Function            PT Goals (current goals can now be found in the care plan section) Acute Rehab PT Goals PT Goal Formulation: With patient Time For Goal Achievement: 08/28/21    Frequency    Min 3X/week      PT Plan Current plan remains appropriate    Co-evaluation              AM-PAC PT "6 Clicks" Mobility   Outcome Measure  Help needed turning from your back to your side while in a flat bed without using bedrails?: A Little Help needed moving from lying on your back to sitting on the side of a flat bed without using bedrails?: A Little Help needed moving to and from a bed to a chair (including a wheelchair)?: A Lot Help needed standing up from a chair using your arms (e.g., wheelchair or bedside chair)?: A Little Help needed to walk in hospital room?: A Lot Help needed climbing 3-5 steps with a railing? : A Lot 6 Click Score: 15    End of Session Equipment Utilized During Treatment: Gait belt Activity Tolerance: Patient tolerated treatment well Patient left: in chair;with call bell/phone within reach;with family/visitor present Nurse Communication: Mobility status PT Visit Diagnosis: Unsteadiness on feet (R26.81)     Time: 1120-1203 PT Time Calculation (min) (ACUTE ONLY): 43  min  Charges:  $Gait Training: 23-37 mins $Therapeutic Exercise: 8-22 mins                     Kamrynn Melott R. PTA Acute Rehabilitation Services Office: [place] 08/17/2021, 12:17 PM

## 2021-08-17 NOTE — Progress Notes (Signed)
Inpatient Rehab Admissions Coordinator:  Saw pt and son at bedside. Informed them that there are no beds available in CIR today. Will continue to follow.   Gayland Curry, [place], [place] Admissions Coordinator [phone]

## 2021-08-18 DIAGNOSIS — Z9049 Acquired absence of other specified parts of digestive tract: Secondary | ICD-10-CM | POA: Diagnosis not present

## 2021-08-18 DIAGNOSIS — Z90410 Acquired total absence of pancreas: Secondary | ICD-10-CM | POA: Diagnosis not present

## 2021-08-18 LAB — GLUCOSE, CAPILLARY
Glucose-Capillary: 101 mg/dL — ABNORMAL HIGH (ref 70–99)
Glucose-Capillary: 102 mg/dL — ABNORMAL HIGH (ref 70–99)
Glucose-Capillary: 127 mg/dL — ABNORMAL HIGH (ref 70–99)
Glucose-Capillary: 152 mg/dL — ABNORMAL HIGH (ref 70–99)
Glucose-Capillary: 189 mg/dL — ABNORMAL HIGH (ref 70–99)
Glucose-Capillary: 225 mg/dL — ABNORMAL HIGH (ref 70–99)
Glucose-Capillary: 97 mg/dL (ref 70–99)

## 2021-08-18 NOTE — Progress Notes (Signed)
Physical Therapy Treatment Patient Details Name: William Barron MRN: [id number] DOB: 10-15-1945 Today's Date: 08/18/2021   History of Present Illness Pt is a 76 yr old M admitted on 08/11/21 from SNF.  Pt has been at [facility] since having Whipple on 5/1, transferred to SNF who immediately reported inability to care for pt.  Imaging (+) for R parotid gland lesion, suspicious of neoplasm.  PMH: DM, multiple myeloma, tremors, prostate CA, HTN    PT Comments    Pt with continued progress this session with ambulation on rollator. Pt able to demonstrate increased tolerance with rollator with mod assist of and +2 for close chair follow for safety. Although pt with increased tolerance for ambulation pt continues to demonstrate very ataxic gait with noted tremors and continues to fatigue quickly needing to sit swiftly and requiring extended seated rest break for recovery. Pt needing cues throughout for breathing techniques as pt with tendency to hold breath or breath rapidly, however pt without complaints of SOB. Current plan remains appropriate to address deficits and maximize functional independence and decrease caregiver burden. Pt continues to benefit from skilled PT services to progress toward functional mobility goals.     Recommendations for follow up therapy are one component of a multi-disciplinary discharge planning process, led by the attending physician.  Recommendations may be updated based on patient status, additional functional criteria and insurance authorization.  Follow Up Recommendations  Acute inpatient rehab (3hours/day)     Assistance Recommended at Discharge Frequent or constant Supervision/Assistance  Patient can return home with the following A lot of help with walking and/or transfers;A lot of help with bathing/dressing/bathroom;Assist for transportation;Assistance with cooking/housework;Help with stairs or ramp for entrance   Equipment Recommendations  Rolling walker (2  wheels);BSC/3in1    Recommendations for Other Services       Precautions / Restrictions Precautions Precautions: Fall Precaution Comments: GJ tube Restrictions Weight Bearing Restrictions: No     Mobility  Bed Mobility Overal bed mobility: Modified Independent             General bed mobility comments: OOB in recliner on arrival    Transfers Overall transfer level: Needs assistance Equipment used: Rolling walker (2 wheels) Transfers: Sit to/from Stand, Bed to chair/wheelchair/BSC Sit to Stand: Min assist, Min guard           General transfer comment: min assist down to min guard throughout session STS x3, light cues for hand placement    Ambulation/Gait Ambulation/Gait assistance: +2 safety/equipment, Mod assist Gait Distance (Feet): 100 Feet (x2) Assistive device: Rollator (4 wheels) Gait Pattern/deviations: Step-through pattern, Decreased stride length, Ataxic, Trunk flexed, Narrow base of support       General Gait Details: slow ataxic gait with noted decrease in tremor this session, mod a +2 for very close chair follow for safety, cues throughout for PLB and to breathe generally and more slowly as pt with tendency for holding breath.   Stairs             Wheelchair Mobility    Modified Rankin (Stroke Patients Only)       Balance Overall balance assessment: Needs assistance Sitting-balance support: Feet supported, Single extremity supported Sitting balance-Leahy Scale: Fair     Standing balance support: Bilateral upper extremity supported, During functional activity, Reliant on assistive device for balance Standing balance-Leahy Scale: Poor Standing balance comment: reliant on RW for support  Cognition Arousal/Alertness: Awake/alert Behavior During Therapy: WFL for tasks assessed/performed Overall Cognitive Status: Within Functional Limits for tasks assessed                                  General Comments: eager to participate        Exercises Other Exercises Other Exercises: seated fast marching x30 secs x3bouts    General Comments General comments (skin integrity, edema, etc.): VSS on RA      Pertinent Vitals/Pain Pain Assessment Pain Assessment: Faces Faces Pain Scale: Hurts a little bit Pain Location: abdomen at incision site Pain Descriptors / Indicators: Grimacing, Guarding Pain Intervention(s): Monitored during session, Limited activity within patient's tolerance    Home Living                          Prior Function            PT Goals (current goals can now be found in the care plan section) Acute Rehab PT Goals PT Goal Formulation: With patient Time For Goal Achievement: 08/28/21    Frequency    Min 3X/week      PT Plan Current plan remains appropriate    Co-evaluation              AM-PAC PT "6 Clicks" Mobility   Outcome Measure  Help needed turning from your back to your side while in a flat bed without using bedrails?: A Little Help needed moving from lying on your back to sitting on the side of a flat bed without using bedrails?: A Little Help needed moving to and from a bed to a chair (including a wheelchair)?: A Lot Help needed standing up from a chair using your arms (e.g., wheelchair or bedside chair)?: A Little Help needed to walk in hospital room?: A Lot Help needed climbing 3-5 steps with a railing? : A Lot 6 Click Score: 15    End of Session Equipment Utilized During Treatment: Gait belt Activity Tolerance: Patient tolerated treatment well Patient left: in chair;with call bell/phone within reach;with family/visitor present Nurse Communication: Mobility status PT Visit Diagnosis: Unsteadiness on feet (R26.81)     Time: 2258-3462 PT Time Calculation (min) (ACUTE ONLY): 28 min  Charges:  $Gait Training: 8-22 mins $Therapeutic Exercise: 8-22 mins                    Lanasia Porras R. PTA Acute  Rehabilitation Services Office: [place] 08/18/2021, 10:16 AM

## 2021-08-18 NOTE — Progress Notes (Signed)
PROGRESS NOTE    ARKEEM HARTS  OMV:[id number] DOB: 06-Mar-1946 DOA: 08/11/2021 PCP: William Hatchet, MD  Chief Complaint  Patient presents with   Needs SNF placement    Brief Narrative:  William Barron is William Barron 76 y.o. male with medical history significant of pancreatic mass status post Whipple and J/G tube April-May 2023, multiple myeloma, prostate cancer status post radiation therapy, HTN, HLD, CAD, amyloidosis, IDDM who was recently discharged from [name] 5/30 after prolonged hospital stay after whipple procedure.  According to [name]'s documentation, patient has strong family history of pancreatic cancer, decision was made for resection of pancreas.  Whipple was performed and postop pathology extensive involvement by amyloidosis (no definite evidence of IPMN or mucinous cystic neoplasm component is identified) (07/13/2021 pathology - previous note had mentioned pathology showing IPMN, but I don't see this). After surgery, patient was on TPN briefly then JG tube inserted by [name] services. He arrived at [facility] on 5/30 and they were under the impression they could not handle his GJ.  They did not receive his discharge summary until he was en route, per my discussion with thme.  It sounds like they thought he needed continuous suction (though per discharge summary, it was noted that g port could be placed to suction for nausea as needed, but it was mostly to gravity).  They initially asked to go back to [name], but EMS wouldn't take them there.  Due to his continued intractable hiccups and need for care and the belief they were unable to manage him at [facility], he came to the [facility] ED.    Currently medically stable for discharge.  Issues were regarding to confusion initially at SNF, then was sent to ED for intractable hiccups as above (per my discussion with wife, staff at [facility]).  At this time, likely CIR discharge 6/7 depending on bed availability?    Assessment & Plan:   Principal Problem:    H/O Whipple procedure Active Problems:   Intractable hiccups   Candida esophagitis (HCC)   Facial droop   Diabetes (HCC)   Anemia   Hypoalbuminemia   Frequent PVCs   Prolonged QT interval   Amyloidosis (HCC)   Multiple myeloma (HCC)   Asymptomatic bacteriuria   Prostate cancer (HCC)   Hormone replacement therapy   Lesion of parotid gland   Protein-calorie malnutrition, severe (HCC)   Feeding difficulties   Feeding tube dysfunction, initial encounter   Assessment and Plan: * H/O Whipple procedure Seems like some confusion regarding need for suction with the GJ -> ultimately led to him being admitted here He's s/p open whipple on 5/1 (care everywhere documentation confusing regarding dates of surgery) Post op course complicated by delirium, UTI, recurrent/continued need for NG placement GJ placed on 5/18 At the time of discharge, had continuous feeds via j port and g port was placed gravity mostly (occasionally placed to suction to alleviate nausea and drain stomach) Clear liquids for comfort (pt son had talked to Dr. Hyman Barron regarding this) Per dc instructions, ok to hook g port to suction of patient bc distended, nauseated or with significant hiccups, should be on continuous j tube feeds    Intractable hiccups Per my discussion with William Barron from [place], intractable hiccups were part of the reason he came [facility] Will stop thorazine and trial gabapentin  Follow, as above, can hook g port to suction as needed for distension or hiccups  Facial droop Mild L facial droop, he'd complained of confusion  on the day of admission.  May have been related to thorazine? MRI brain without acute intracranial path  Candida esophagitis (William Barron) Plan for 14 days fluconazole per [name] d/c summary He was discharged with 10 days worth on 5/30, should be complete 6/9  Diabetes (William Barron) semglee 6 units with SSI q4  Anemia Trend Low iron, sat ratios, normal ferritin, folate, b12.   Iron def, aocd? 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 Surgical pathology from 5/1 with hepatic artery LN involved by amyloid, gallbladder involved by amyloid, pancreas, portion of stomach, and duodenum with extensive involvement by amyloidosis, 13 lymph nodes involved by amyloidosis, negative for tumor (see report in care everywhere)  Asymptomatic bacteriuria ESBL klebsiella and citrobacter freundii  Currently asymptomatic Will follow for sx   Multiple myeloma (William Barron) ixazomib is on d/c meds from [place] Also on 4 mg dexamethazone once weekly  Holding at this time (he's not currently taking these meds)  Doctor is at [facility]) S/p radiation Will follow with pt/wife  Hormone replacement therapy Resume home testosterone  Lesion of parotid gland Needs ENT follow up outpatient  Concerning for parotid neoplasm  Protein-calorie malnutrition, severe (William Barron) Tube feeds RD c/s      DVT prophylaxis: lovenox Code Status: full Family Communication: none - called son, no answer Disposition:   Status is: Inpatient Remains inpatient appropriate because: pending safe discharge plan - stable for discharge - looking into CIR, etc   Consultants:  none  Procedures:  none  Antimicrobials:  Anti-infectives (From admission, onward)    Start     Dose/Rate Route Frequency Ordered Stop   08/15/21 1000  fluconazole (DIFLUCAN) tablet 200 mg  Status:  Discontinued        200 mg Per Tube Daily 08/14/21 1106 08/14/21 1230   08/14/21 1230  fluconazole (DIFLUCAN) tablet 200 mg        200 mg Per Tube Daily 08/14/21 1230 08/21/21 0959   08/12/21 1348  fluconazole (DIFLUCAN) 40 MG/ML suspension 200 mg  Status:  Discontinued       Note to Pharmacy: Qd x 10 days     200 mg Per Tube  See admin instructions 08/12/21 1352 08/12/21 1354   08/12/21 1000  fluconazole (DIFLUCAN) tablet 200 mg  Status:  Discontinued        200 mg Oral Daily 08/12/21 0152 08/14/21 1106       Subjective: Asking about testosterone again  Objective: Vitals:   08/18/21 0503 08/18/21 0829 08/18/21 0851 08/18/21 1516  BP: (!) 117/45 (!) 76/61 114/63 (!) 101/51  Pulse: 80 (!) 103 97 80  Resp: '15 18 17 18  ' Temp: 98 F (36.7 C) 98.1 F (36.7 C)    TempSrc: Oral Oral    SpO2: 96% 100% 97% 98%  Weight:      Height:        Intake/Output Summary (Last 24 hours) at 08/18/2021 1637 Last data filed at 08/18/2021 1100 Gross per 24 hour  Intake 280 ml  Output 1250 ml  Net -970 ml   Filed Weights   08/14/21 0600 08/15/21 0438 08/16/21 0500  Weight: 54.2 kg 57.6 kg 56.1 kg    Examination:  General: No acute distress.  Transferring back to bed. Lungs: unlabored Neurological: Alert and oriented 3. Moves all  extremities 4 with equal strength. Extremities: No clubbing or cyanosis. No edema.   Data Reviewed: I have personally reviewed following labs and imaging studies  CBC: Recent Labs  Lab 08/11/21 1900 08/15/21 0130 08/16/21 0052 08/17/21 0310  WBC 8.4 10.9* 12.3* 10.1  NEUTROABS 7.0 8.7* 10.4* 8.2*  HGB 8.1* 8.5* 8.2* 8.6*  HCT 26.8* 26.3* 25.8* 27.4*  MCV 90.5 85.9 86.0 86.2  PLT 422* 523* 569* 575*    Basic Metabolic Panel: Recent Labs  Lab 08/11/21 1900 08/12/21 1921 08/13/21 0604 08/15/21 0130 08/16/21 0052 08/17/21 0310  NA 135  --  132* 134* 132* 136  K 3.9  --  4.6 4.0 4.1 4.1  CL 107  --  99 100 100 101  CO2 20*  --  '25 29 25 25  ' GLUCOSE 112*  --  215* 98 128* 117*  BUN 15  --  '15 16 19 21  ' CREATININE 0.71  --  0.95 1.00 0.87 0.86  CALCIUM 7.5*  --  8.5* 8.3* 8.3* 8.5*  MG  --  2.0  --  1.8  --  1.8  PHOS  --   --   --  3.4  --  4.1    GFR: Estimated Creatinine Clearance: 56.2 mL/min (by C-G formula based on SCr of 0.86 mg/dL).  Liver Function  Tests: Recent Labs  Lab 08/11/21 1900 08/15/21 0130 08/17/21 0310  AST '24 30 26  ' ALT 27 42 40  ALKPHOS 406* 626* 595*  BILITOT 0.1* 0.4 0.5  PROT 5.1* 5.4* 5.5*  ALBUMIN 2.1* 2.3* 2.4*    CBG: Recent Labs  Lab 08/18/21 0047 08/18/21 0423 08/18/21 0847 08/18/21 1212 08/18/21 1518  GLUCAP 97 152* 189* 225* 127*     Recent Results (from the past 240 hour(s))  Urine Culture     Status: Abnormal   Collection Time: 08/11/21  8:53 PM   Specimen: Urine, Clean Catch  Result Value Ref Range Status   Specimen Description URINE, CLEAN CATCH  Final   Special Requests   Final    NONE Performed at [facility], [place] [address] [place], [place] [address]    Culture (William Barron)  Final    70,000 COLONIES/mL KLEBSIELLA PNEUMONIAE Confirmed Extended Spectrum Beta-Lactamase Producer (ESBL).  In bloodstream infections from ESBL organisms, carbapenems are preferred over piperacillin/tazobactam. They are shown to have Cniyah Sproull lower risk of mortality. 20,000 COLONIES/mL CITROBACTER FREUNDII    Report Status 08/15/2021 FINAL  Final   Organism ID, Bacteria KLEBSIELLA PNEUMONIAE (William Barron)  Final   Organism ID, Bacteria CITROBACTER FREUNDII (William Barron)  Final      Susceptibility   Citrobacter freundii - MIC*    CEFAZOLIN >=64 RESISTANT Resistant     CEFEPIME <=0.12 SENSITIVE Sensitive     CEFTRIAXONE <=0.25 SENSITIVE Sensitive     CIPROFLOXACIN <=0.25 SENSITIVE Sensitive     GENTAMICIN <=1 SENSITIVE Sensitive     IMIPENEM 0.5 SENSITIVE Sensitive     NITROFURANTOIN 32 SENSITIVE Sensitive     TRIMETH/SULFA <=20 SENSITIVE Sensitive     PIP/TAZO 8 SENSITIVE Sensitive     * 20,000 COLONIES/mL CITROBACTER FREUNDII   Klebsiella pneumoniae - MIC*    AMPICILLIN >=32 RESISTANT Resistant     CEFAZOLIN >=64 RESISTANT Resistant     CEFEPIME >=32 RESISTANT Resistant     CEFTRIAXONE >=64 RESISTANT Resistant     CIPROFLOXACIN 2 RESISTANT Resistant     GENTAMICIN >=16 RESISTANT Resistant     IMIPENEM 0.5 SENSITIVE  Sensitive  NITROFURANTOIN 64 INTERMEDIATE Intermediate     TRIMETH/SULFA >=320 RESISTANT Resistant     AMPICILLIN/SULBACTAM >=32 RESISTANT Resistant     PIP/TAZO 16 SENSITIVE Sensitive     * 70,000 COLONIES/mL KLEBSIELLA PNEUMONIAE  MRSA Next Gen by PCR, Nasal     Status: None   Collection Time: 08/13/21 10:50 AM   Specimen: Nasal Mucosa; Nasal Swab  Result Value Ref Range Status   MRSA by PCR Next Gen NOT DETECTED NOT DETECTED Final    Comment: (NOTE) The GeneXpert MRSA Assay (FDA approved for NASAL specimens only), is one component of William Barron comprehensive MRSA colonization surveillance program. It is not intended to diagnose MRSA infection nor to guide or monitor treatment for MRSA infections. Test performance is not FDA approved in patients less than 69 years old. Performed at [facility], [place] [address] [place], [place] [address]          Radiology Studies: No results found.      Scheduled Meds:  chlorhexidine  15 mL Mouth Rinse BID   enoxaparin (LOVENOX) injection  40 mg Subcutaneous Q24H   ferrous sulfate  220 mg Per Tube QODAY   fluconazole  200 mg Per Tube Daily   gabapentin  100 mg Per Tube Q8H   insulin aspart  0-9 Units Subcutaneous Q4H   insulin glargine-yfgn  6 Units Subcutaneous Daily   mouth rinse  15 mL Mouth Rinse q12n4p   melatonin  6 mg Per Tube QHS   metoprolol tartrate  12.5 mg Per J Tube BID   pantoprazole sodium  40 mg Per Tube Daily   testosterone  10 g Transdermal Daily   Continuous Infusions:  feeding supplement (GLUCERNA 1.5 CAL) 65 mL/hr at 08/17/21 1800     LOS: 5 days    Time spent: over 30 min    Fayrene Helper, MD [facility]   To contact the attending provider between 7A-7P or the covering provider during after hours 7P-7A, please log into the web site [email or web address] and access using universal [place] password for that web site. If you do not have the password, please call the hospital  operator.  08/18/2021, 4:37 PM

## 2021-08-18 NOTE — Progress Notes (Signed)
Inpatient Rehab Admissions Coordinator:    I do not have a bed for this Pt. On CIR today, but I'm hopeful for admission tomorrow.   Clemens Catholic, [place], [place] Admissions Coordinator  [phone] ([place]) [phone] (office)

## 2021-08-19 ENCOUNTER — Other Ambulatory Visit: Payer: Self-pay

## 2021-08-19 ENCOUNTER — Encounter (HOSPITAL_COMMUNITY): Payer: Self-pay | Admitting: Physical Medicine & Rehabilitation

## 2021-08-19 ENCOUNTER — Inpatient Hospital Stay (HOSPITAL_COMMUNITY)
Admission: RE | Admit: 2021-08-19 | Discharge: 2021-08-31 | DRG: 945 | Disposition: A | Discharge status: Skilled Nursing Facility | Payer: Medicare Other | Source: Intra-hospital | Attending: Physical Medicine & Rehabilitation | Admitting: Physical Medicine & Rehabilitation

## 2021-08-19 DIAGNOSIS — Y838 Other surgical procedures as the cause of abnormal reaction of the patient, or of later complication, without mention of misadventure at the time of the procedure: Secondary | ICD-10-CM | POA: Diagnosis not present

## 2021-08-19 DIAGNOSIS — R066 Hiccough: Secondary | ICD-10-CM

## 2021-08-19 DIAGNOSIS — B3781 Candidal esophagitis: Secondary | ICD-10-CM | POA: Diagnosis present

## 2021-08-19 DIAGNOSIS — Z9049 Acquired absence of other specified parts of digestive tract: Secondary | ICD-10-CM

## 2021-08-19 DIAGNOSIS — Z981 Arthrodesis status: Secondary | ICD-10-CM

## 2021-08-19 DIAGNOSIS — I493 Ventricular premature depolarization: Secondary | ICD-10-CM

## 2021-08-19 DIAGNOSIS — C61 Malignant neoplasm of prostate: Secondary | ICD-10-CM | POA: Diagnosis present

## 2021-08-19 DIAGNOSIS — E43 Unspecified severe protein-calorie malnutrition: Secondary | ICD-10-CM | POA: Diagnosis present

## 2021-08-19 DIAGNOSIS — K119 Disease of salivary gland, unspecified: Secondary | ICD-10-CM | POA: Diagnosis present

## 2021-08-19 DIAGNOSIS — E859 Amyloidosis, unspecified: Secondary | ICD-10-CM

## 2021-08-19 DIAGNOSIS — K9413 Enterostomy malfunction: Secondary | ICD-10-CM | POA: Diagnosis not present

## 2021-08-19 DIAGNOSIS — C9000 Multiple myeloma not having achieved remission: Secondary | ICD-10-CM | POA: Diagnosis present

## 2021-08-19 DIAGNOSIS — Z934 Other artificial openings of gastrointestinal tract status: Secondary | ICD-10-CM

## 2021-08-19 DIAGNOSIS — R8271 Bacteriuria: Secondary | ICD-10-CM

## 2021-08-19 DIAGNOSIS — Z79890 Hormone replacement therapy (postmenopausal): Secondary | ICD-10-CM

## 2021-08-19 DIAGNOSIS — I2510 Atherosclerotic heart disease of native coronary artery without angina pectoris: Secondary | ICD-10-CM | POA: Diagnosis present

## 2021-08-19 DIAGNOSIS — H4920 Sixth [abducent] nerve palsy, unspecified eye: Secondary | ICD-10-CM | POA: Diagnosis present

## 2021-08-19 DIAGNOSIS — E876 Hypokalemia: Secondary | ICD-10-CM

## 2021-08-19 DIAGNOSIS — R5381 Other malaise: Secondary | ICD-10-CM | POA: Diagnosis present

## 2021-08-19 DIAGNOSIS — E611 Iron deficiency: Secondary | ICD-10-CM | POA: Diagnosis present

## 2021-08-19 DIAGNOSIS — K219 Gastro-esophageal reflux disease without esophagitis: Secondary | ICD-10-CM | POA: Diagnosis present

## 2021-08-19 DIAGNOSIS — Z794 Long term (current) use of insulin: Secondary | ICD-10-CM

## 2021-08-19 DIAGNOSIS — D649 Anemia, unspecified: Secondary | ICD-10-CM | POA: Diagnosis not present

## 2021-08-19 DIAGNOSIS — D62 Acute posthemorrhagic anemia: Secondary | ICD-10-CM | POA: Diagnosis present

## 2021-08-19 DIAGNOSIS — R9431 Abnormal electrocardiogram [ECG] [EKG]: Secondary | ICD-10-CM

## 2021-08-19 DIAGNOSIS — K118 Other diseases of salivary glands: Secondary | ICD-10-CM | POA: Diagnosis present

## 2021-08-19 DIAGNOSIS — E785 Hyperlipidemia, unspecified: Secondary | ICD-10-CM | POA: Diagnosis present

## 2021-08-19 DIAGNOSIS — R29810 Facial weakness: Secondary | ICD-10-CM

## 2021-08-19 DIAGNOSIS — Z923 Personal history of irradiation: Secondary | ICD-10-CM

## 2021-08-19 DIAGNOSIS — Z90410 Acquired total absence of pancreas: Secondary | ICD-10-CM | POA: Diagnosis not present

## 2021-08-19 DIAGNOSIS — G250 Essential tremor: Secondary | ICD-10-CM | POA: Diagnosis present

## 2021-08-19 DIAGNOSIS — E119 Type 2 diabetes mellitus without complications: Secondary | ICD-10-CM

## 2021-08-19 DIAGNOSIS — H9190 Unspecified hearing loss, unspecified ear: Secondary | ICD-10-CM | POA: Diagnosis present

## 2021-08-19 DIAGNOSIS — D75838 Other thrombocytosis: Secondary | ICD-10-CM | POA: Diagnosis present

## 2021-08-19 DIAGNOSIS — E8809 Other disorders of plasma-protein metabolism, not elsewhere classified: Secondary | ICD-10-CM

## 2021-08-19 DIAGNOSIS — R000 Tachycardia, unspecified: Secondary | ICD-10-CM

## 2021-08-19 DIAGNOSIS — Z87891 Personal history of nicotine dependence: Secondary | ICD-10-CM

## 2021-08-19 DIAGNOSIS — Z974 Presence of external hearing-aid: Secondary | ICD-10-CM

## 2021-08-19 DIAGNOSIS — R5383 Other fatigue: Secondary | ICD-10-CM | POA: Diagnosis present

## 2021-08-19 DIAGNOSIS — E440 Moderate protein-calorie malnutrition: Secondary | ICD-10-CM | POA: Diagnosis present

## 2021-08-19 DIAGNOSIS — H532 Diplopia: Secondary | ICD-10-CM | POA: Diagnosis present

## 2021-08-19 DIAGNOSIS — Z8042 Family history of malignant neoplasm of prostate: Secondary | ICD-10-CM

## 2021-08-19 DIAGNOSIS — Z6821 Body mass index (BMI) 21.0-21.9, adult: Secondary | ICD-10-CM

## 2021-08-19 DIAGNOSIS — Z85828 Personal history of other malignant neoplasm of skin: Secondary | ICD-10-CM

## 2021-08-19 DIAGNOSIS — C9001 Multiple myeloma in remission: Secondary | ICD-10-CM

## 2021-08-19 DIAGNOSIS — Z90411 Acquired partial absence of pancreas: Secondary | ICD-10-CM

## 2021-08-19 DIAGNOSIS — Z79891 Long term (current) use of opiate analgesic: Secondary | ICD-10-CM

## 2021-08-19 DIAGNOSIS — D509 Iron deficiency anemia, unspecified: Secondary | ICD-10-CM

## 2021-08-19 DIAGNOSIS — Z880 Allergy status to penicillin: Secondary | ICD-10-CM

## 2021-08-19 DIAGNOSIS — Z79899 Other long term (current) drug therapy: Secondary | ICD-10-CM

## 2021-08-19 LAB — COMPREHENSIVE METABOLIC PANEL
ALT: 29 U/L (ref 0–44)
AST: 19 U/L (ref 15–41)
Albumin: 2.3 g/dL — ABNORMAL LOW (ref 3.5–5.0)
Alkaline Phosphatase: 539 U/L — ABNORMAL HIGH (ref 38–126)
Anion gap: 9 (ref 5–15)
BUN: 17 mg/dL (ref 8–23)
CO2: 27 mmol/L (ref 22–32)
Calcium: 8.6 mg/dL — ABNORMAL LOW (ref 8.9–10.3)
Chloride: 99 mmol/L (ref 98–111)
Creatinine, Ser: 0.97 mg/dL (ref 0.61–1.24)
GFR, Estimated: >60 mL/min (ref >60)
Glucose, Bld: 132 mg/dL — ABNORMAL HIGH (ref 70–99)
Potassium: 3.9 mmol/L (ref 3.5–5.1)
Sodium: 135 mmol/L (ref 135–145)
Total Bilirubin: 0.3 mg/dL (ref 0.3–1.2)
Total Protein: 5.5 g/dL — ABNORMAL LOW (ref 6.5–8.1)

## 2021-08-19 LAB — CBC WITH DIFFERENTIAL/PLATELET
Abs Immature Granulocytes: 0.06 10*3/uL (ref 0.00–0.07)
Basophils Absolute: 0.1 10*3/uL (ref 0.0–0.1)
Basophils Relative: 1 %
Eosinophils Absolute: 0.3 10*3/uL (ref 0.0–0.5)
Eosinophils Relative: 3 %
HCT: 25.7 % — ABNORMAL LOW (ref 39.0–52.0)
Hemoglobin: 8 g/dL — ABNORMAL LOW (ref 13.0–17.0)
Immature Granulocytes: 1 %
Lymphocytes Relative: 8 %
Lymphs Abs: 0.8 10*3/uL (ref 0.7–4.0)
MCH: 26.9 pg (ref 26.0–34.0)
MCHC: 31.1 g/dL (ref 30.0–36.0)
MCV: 86.5 fL (ref 80.0–100.0)
Monocytes Absolute: 0.6 10*3/uL (ref 0.1–1.0)
Monocytes Relative: 6 %
Neutro Abs: 7.9 10*3/uL — ABNORMAL HIGH (ref 1.7–7.7)
Neutrophils Relative %: 81 %
Platelets: 585 10*3/uL — ABNORMAL HIGH (ref 150–400)
RBC: 2.97 MIL/uL — ABNORMAL LOW (ref 4.22–5.81)
RDW: 15.1 % (ref 11.5–15.5)
WBC: 9.7 10*3/uL (ref 4.0–10.5)
nRBC: 0 % (ref 0.0–0.2)

## 2021-08-19 LAB — GLUCOSE, CAPILLARY
Glucose-Capillary: 105 mg/dL — ABNORMAL HIGH (ref 70–99)
Glucose-Capillary: 108 mg/dL — ABNORMAL HIGH (ref 70–99)
Glucose-Capillary: 119 mg/dL — ABNORMAL HIGH (ref 70–99)
Glucose-Capillary: 145 mg/dL — ABNORMAL HIGH (ref 70–99)
Glucose-Capillary: 147 mg/dL — ABNORMAL HIGH (ref 70–99)
Glucose-Capillary: 203 mg/dL — ABNORMAL HIGH (ref 70–99)

## 2021-08-19 LAB — PHOSPHORUS: Phosphorus: 3.9 mg/dL (ref 2.5–4.6)

## 2021-08-19 LAB — MAGNESIUM: Magnesium: 1.9 mg/dL (ref 1.7–2.4)

## 2021-08-19 MED ORDER — FLUCONAZOLE 100 MG PO TABS
200.0000 mg | ORAL_TABLET | Freq: Every day | ORAL | Status: AC
  Administered 2021-08-20: 200 mg
  Filled 2021-08-19: qty 2

## 2021-08-19 MED ORDER — BISACODYL 10 MG RE SUPP: 10.0000 mg | Freq: Every day | RECTAL | Status: DC | PRN

## 2021-08-19 MED ORDER — METOPROLOL TARTRATE 12.5 MG HALF TABLET
12.5000 mg | ORAL_TABLET | Freq: Two times a day (BID) | ORAL | Status: DC
Start: 2021-08-19 — End: 2021-08-27
  Administered 2021-08-19 – 2021-08-25 (×12): 12.5 mg via JEJUNOSTOMY
  Filled 2021-08-19 (×14): qty 1

## 2021-08-19 MED ORDER — ALUM & MAG HYDROXIDE-SIMETH 200-200-20 MG/5ML PO SUSP
30.0000 mL | ORAL | Status: DC | PRN
  Administered 2021-08-28: 30 mL
  Filled 2021-08-19: qty 30

## 2021-08-19 MED ORDER — GLUCERNA 1.5 CAL PO LIQD
1000.0000 mL | ORAL | Status: DC
Start: 2021-08-19 — End: 2021-08-21
  Administered 2021-08-19 – 2021-08-21 (×3): 1000 mL
  Filled 2021-08-19 (×4): qty 1000

## 2021-08-19 MED ORDER — ORAL CARE MOUTH RINSE
15.0000 mL | Freq: Two times a day (BID) | OROMUCOSAL | Status: DC
  Administered 2021-08-19 – 2021-08-31 (×25): 15 mL via OROMUCOSAL

## 2021-08-19 MED ORDER — TESTOSTERONE 20.25 MG/ACT (1.62%) TD GEL
4.0000 | Freq: Every day | TRANSDERMAL | Status: DC
Start: 2021-08-19 — End: 2021-08-19

## 2021-08-19 MED ORDER — PROCHLORPERAZINE MALEATE 5 MG PO TABS
5.0000 mg | ORAL_TABLET | Freq: Four times a day (QID) | ORAL | Status: DC | PRN
  Filled 2021-08-19: qty 2

## 2021-08-19 MED ORDER — ACETAMINOPHEN 325 MG PO TABS
325.0000 mg | ORAL_TABLET | ORAL | Status: DC | PRN
  Filled 2021-08-19 (×2): qty 2

## 2021-08-19 MED ORDER — PROCHLORPERAZINE EDISYLATE 10 MG/2ML IJ SOLN
5.0000 mg | Freq: Four times a day (QID) | INTRAMUSCULAR | Status: DC | PRN
  Administered 2021-08-28: 10 mg via INTRAMUSCULAR
  Filled 2021-08-19: qty 2

## 2021-08-19 MED ORDER — TESTOSTERONE 50 MG/5GM (1%) TD GEL
10.0000 g | Freq: Every day | TRANSDERMAL | Status: DC
Start: 2021-08-20 — End: 2021-08-19

## 2021-08-19 MED ORDER — DIPHENHYDRAMINE HCL 12.5 MG/5ML PO ELIX: 12.5000 mg | ORAL_SOLUTION | Freq: Four times a day (QID) | ORAL | Status: DC | PRN

## 2021-08-19 MED ORDER — PANTOPRAZOLE 2 MG/ML SUSPENSION
40.0000 mg | Freq: Every day | ORAL | Status: DC
  Administered 2021-08-20 – 2021-08-31 (×11): 40 mg
  Filled 2021-08-19 (×11): qty 20

## 2021-08-19 MED ORDER — TRAZODONE HCL 50 MG PO TABS
50.0000 mg | ORAL_TABLET | Freq: Every evening | ORAL | Status: DC | PRN
  Administered 2021-08-19 – 2021-08-24 (×6): 50 mg
  Filled 2021-08-19 (×7): qty 1

## 2021-08-19 MED ORDER — GABAPENTIN 250 MG/5ML PO SOLN
100.0000 mg | Freq: Three times a day (TID) | ORAL | Status: DC
Start: 2021-08-19 — End: 2021-08-25
  Administered 2021-08-19 – 2021-08-25 (×17): 100 mg
  Filled 2021-08-19 (×19): qty 2

## 2021-08-19 MED ORDER — OXYCODONE HCL 5 MG PO TABS: 2.5000 mg | ORAL_TABLET | ORAL | Status: DC | PRN

## 2021-08-19 MED ORDER — POLYETHYLENE GLYCOL 3350 17 G PO PACK: 17.0000 g | PACK | Freq: Every day | ORAL | Status: DC | PRN

## 2021-08-19 MED ORDER — INSULIN GLARGINE-YFGN 100 UNIT/ML ~~LOC~~ SOLN
6.0000 [IU] | Freq: Every day | SUBCUTANEOUS | Status: DC
  Administered 2021-08-20 – 2021-08-25 (×6): 6 [IU] via SUBCUTANEOUS
  Filled 2021-08-19 (×7): qty 0.06

## 2021-08-19 MED ORDER — PROCHLORPERAZINE 25 MG RE SUPP: 12.5000 mg | Freq: Four times a day (QID) | RECTAL | Status: DC | PRN

## 2021-08-19 MED ORDER — INSULIN ASPART 100 UNIT/ML IJ SOLN
0.0000 [IU] | INTRAMUSCULAR | Status: DC
  Administered 2021-08-20 (×3): 1 [IU] via SUBCUTANEOUS
  Administered 2021-08-20: 2 [IU] via SUBCUTANEOUS
  Administered 2021-08-20 – 2021-08-22 (×8): 1 [IU] via SUBCUTANEOUS
  Administered 2021-08-22 – 2021-08-23 (×4): 2 [IU] via SUBCUTANEOUS
  Administered 2021-08-23 (×2): 1 [IU] via SUBCUTANEOUS
  Administered 2021-08-23 (×2): 2 [IU] via SUBCUTANEOUS
  Administered 2021-08-24: 1 [IU] via SUBCUTANEOUS
  Administered 2021-08-24 (×3): 2 [IU] via SUBCUTANEOUS
  Administered 2021-08-24 – 2021-08-26 (×5): 1 [IU] via SUBCUTANEOUS
  Administered 2021-08-27: 2 [IU] via SUBCUTANEOUS
  Administered 2021-08-27: 3 [IU] via SUBCUTANEOUS
  Administered 2021-08-27: 1 [IU] via SUBCUTANEOUS
  Administered 2021-08-28: 2 [IU] via SUBCUTANEOUS
  Administered 2021-08-28: 3 [IU] via SUBCUTANEOUS
  Administered 2021-08-28: 2 [IU] via SUBCUTANEOUS
  Administered 2021-08-28: 1 [IU] via SUBCUTANEOUS
  Administered 2021-08-28 – 2021-08-29 (×6): 2 [IU] via SUBCUTANEOUS
  Administered 2021-08-30: 1 [IU] via SUBCUTANEOUS
  Administered 2021-08-30 (×3): 2 [IU] via SUBCUTANEOUS
  Administered 2021-08-30: 5 [IU] via SUBCUTANEOUS
  Administered 2021-08-30: 1 [IU] via SUBCUTANEOUS
  Administered 2021-08-31: 3 [IU] via SUBCUTANEOUS
  Administered 2021-08-31 (×2): 2 [IU] via SUBCUTANEOUS

## 2021-08-19 MED ORDER — FLEET ENEMA 7-19 GM/118ML RE ENEM: 1.0000 | ENEMA | Freq: Once | RECTAL | Status: DC | PRN

## 2021-08-19 MED ORDER — FERROUS SULFATE 220 (44 FE) MG/5ML PO ELIX
220.0000 mg | ORAL_SOLUTION | ORAL | Status: DC
Start: 2021-08-20 — End: 2021-08-27
  Administered 2021-08-20 – 2021-08-24 (×3): 220 mg
  Filled 2021-08-19 (×6): qty 5

## 2021-08-19 MED ORDER — MELATONIN 3 MG PO TABS
6.0000 mg | ORAL_TABLET | Freq: Every day | ORAL | Status: DC
  Administered 2021-08-19 – 2021-08-21 (×3): 6 mg
  Filled 2021-08-19 (×4): qty 2

## 2021-08-19 MED ORDER — TRAZODONE HCL 50 MG PO TABS: 25.0000 mg | ORAL_TABLET | Freq: Every evening | ORAL | Status: DC | PRN

## 2021-08-19 MED ORDER — ENOXAPARIN SODIUM 40 MG/0.4ML IJ SOSY
40.0000 mg | PREFILLED_SYRINGE | INTRAMUSCULAR | Status: DC
  Administered 2021-08-19 – 2021-08-30 (×12): 40 mg via SUBCUTANEOUS
  Filled 2021-08-19 (×12): qty 0.4

## 2021-08-19 MED ORDER — PHENOL 1.4 % MT LIQD: 1.0000 | OROMUCOSAL | Status: DC | PRN

## 2021-08-19 MED ORDER — TESTOSTERONE 50 MG/5GM (1%) TD GEL
10.0000 g | Freq: Every day | TRANSDERMAL | Status: DC
Start: 2021-08-20 — End: 2021-08-31
  Administered 2021-08-20 – 2021-08-31 (×12): 10 g via TRANSDERMAL
  Filled 2021-08-19 (×12): qty 10

## 2021-08-19 MED ORDER — GUAIFENESIN-DM 100-10 MG/5ML PO SYRP: 5.0000 mL | ORAL_SOLUTION | Freq: Four times a day (QID) | ORAL | Status: DC | PRN

## 2021-08-19 NOTE — Progress Notes (Signed)
Inpatient Rehab Admissions Coordinator:    I have a CIR bed for this Pt. Today, RN may call [phone].  Clemens Catholic, [place], [place] Admissions Coordinator  [phone] ([place]) [phone] (office)

## 2021-08-19 NOTE — Progress Notes (Signed)
Occupational Therapy Treatment Patient Details Name: William Barron MRN: [id number] DOB: [birth year] Today's Date: 08/19/2021   History of present illness Pt is a 76 yr old M admitted on 08/11/21 from SNF.  Pt has been at [facility] since having Whipple on 5/1, transferred to SNF who immediately reported inability to care for pt.  Imaging (+) for R parotid gland lesion, suspicious of neoplasm.  PMH: DM, multiple myeloma, tremors, prostate CA, HTN   OT comments  Patient up in recliner upon entry. Patient able to donn shoes seated in recliner. Patient asked to address mobility with rollator. Patient educated on locking brakes and safety with rollator. Patient was min assist for sit to stands and with mobility. Standing performed with rollator for support and performed reaching tasks to simulate standing grooming activity with min assist for balance. Patient expected to discharge to [facility] today.    Recommendations for follow up therapy are one component of a multi-disciplinary discharge planning process, led by the attending physician.  Recommendations may be updated based on patient status, additional functional criteria and insurance authorization.    Follow Up Recommendations  Acute inpatient rehab (3hours/day)    Assistance Recommended at Discharge Frequent or constant Supervision/Assistance  Patient can return home with the following  A lot of help with walking and/or transfers;A lot of help with bathing/dressing/bathroom;Assistance with cooking/housework;Direct supervision/assist for medications management;Direct supervision/assist for financial management;Assist for transportation;Help with stairs or ramp for entrance   Equipment Recommendations  Other (comment)    Recommendations for Other Services      Precautions / Restrictions Precautions Precautions: Fall Precaution Comments: GJ tube Restrictions Weight Bearing Restrictions: No       Mobility Bed Mobility Overal bed mobility:  Modified Independent             General bed mobility comments: OOB in recliner    Transfers Overall transfer level: Needs assistance Equipment used: Rollator (4 wheels) Transfers: Sit to/from Stand, Bed to chair/wheelchair/BSC Sit to Stand: Min assist           General transfer comment: transfers and mobility addressed with rollator per patient's request with min assist and cues for locking brakes     Balance Overall balance assessment: Needs assistance Sitting-balance support: Feet supported, Single extremity supported Sitting balance-Leahy Scale: Fair     Standing balance support: Single extremity supported, Bilateral upper extremity supported, During functional activity Standing balance-Leahy Scale: Poor Standing balance comment: performed reaching tasks standing with min assist for balance                           ADL either performed or assessed with clinical judgement   ADL Overall ADL's : Needs assistance/impaired                     Lower Body Dressing: Set up;Sitting/lateral leans Lower Body Dressing Details (indicate cue type and reason): donned shoes               General ADL Comments: donned shoes seated in recliner    Extremity/Trunk Assessment              Vision       Perception     Praxis      Cognition Arousal/Alertness: Awake/alert Behavior During Therapy: WFL for tasks assessed/performed Overall Cognitive Status: Within Functional Limits for tasks assessed  Current Attention Level: Sustained   Following Commands: Follows one step commands with increased time, Follows multi-step commands inconsistently Safety/Judgement: Decreased awareness of safety Awareness: Emergent Problem Solving: Slow processing, Decreased initiation, Difficulty sequencing, Requires verbal cues, Requires tactile cues          Exercises      Shoulder Instructions       General Comments       Pertinent Vitals/ Pain       Pain Assessment Pain Assessment: Faces Faces Pain Scale: Hurts a little bit Pain Location: abdomen at incision site Pain Descriptors / Indicators: Grimacing, Guarding Pain Intervention(s): Monitored during session  Home Living                                          Prior Functioning/Environment              Frequency  Min 2X/week        Progress Toward Goals  OT Goals(current goals can now be found in the care plan section)  Progress towards OT goals: Progressing toward goals  Acute Rehab OT Goals Patient Stated Goal: go to rehab OT Goal Formulation: With patient/family Time For Goal Achievement: 08/28/21 Potential to Achieve Goals: Good ADL Goals Pt Will Perform Lower Body Bathing: with set-up;sitting/lateral leans;sit to/from stand;with adaptive equipment Pt Will Perform Lower Body Dressing: with set-up;with adaptive equipment;sitting/lateral leans;sit to/from stand Pt Will Transfer to Toilet: with set-up;ambulating;grab bars Pt Will Perform Toileting - Clothing Manipulation and hygiene: with set-up;sit to/from stand;sitting/lateral leans Additional ADL Goal #1: Patient will demonstrate increased activity tolerance to complete functional task in standing for 2-3 minutes without need for seated rest break to promote increased ability at discharge.  Plan Discharge plan remains appropriate    Co-evaluation                 AM-PAC OT "6 Clicks" Daily Activity     Outcome Measure   Help from another person eating meals?: A Little Help from another person taking care of personal grooming?: A Little Help from another person toileting, which includes using toliet, bedpan, or urinal?: A Lot Help from another person bathing (including washing, rinsing, drying)?: A Lot Help from another person to put on and taking off regular upper body clothing?: A Little Help from another person to put on and taking off regular  lower body clothing?: A Lot 6 Click Score: 15    End of Session Equipment Utilized During Treatment: Gait belt;Rollator (4 wheels)  OT Visit Diagnosis: Unsteadiness on feet (R26.81);Other abnormalities of gait and mobility (R26.89);Repeated falls (R29.6);Muscle weakness (generalized) (M62.81);History of falling (Z91.81)   Activity Tolerance Patient tolerated treatment well   Patient Left in chair;with call bell/phone within reach;with family/visitor present   Nurse Communication Mobility status        Time: 4166-0630 OT Time Calculation (min): 21 min  Charges: OT General Charges $OT Visit: 1 Visit OT Treatments $Self Care/Home Management : 8-22 mins  Lodema Hong, [place]  Pager [phone] Office [place] 08/19/2021, 3:12 PM

## 2021-08-19 NOTE — Progress Notes (Signed)
Physical Therapy Treatment Patient Details Name: William Barron MRN: [id number] DOB: 15-Jul-1945 Today's Date: 08/19/2021   History of Present Illness Pt is a 76 yr old M admitted on 08/11/21 from SNF.  Pt has been at [facility] since having Whipple on 5/1, transferred to SNF who immediately reported inability to care for pt.  Imaging (+) for R parotid gland lesion, suspicious of neoplasm.  PMH: DM, multiple myeloma, tremors, prostate CA, HTN    PT Comments    Today's skilled session continued to focus on general mobility and LE strengthening with addition of resistance with no issues noted or reported in session. The pt is progressing well and remains appropriate for post acute recommendations of AIR.    Recommendations for follow up therapy are one component of a multi-disciplinary discharge planning process, led by the attending physician.  Recommendations may be updated based on patient status, additional functional criteria and insurance authorization.  Follow Up Recommendations  Acute inpatient rehab (3hours/day)     Assistance Recommended at Discharge Frequent or constant Supervision/Assistance  Patient can return home with the following A lot of help with walking and/or transfers;A lot of help with bathing/dressing/bathroom;Assist for transportation;Assistance with cooking/housework;Help with stairs or ramp for entrance   Equipment Recommendations  Rolling walker (2 wheels);BSC/3in1       Precautions / Restrictions Precautions Precautions: Fall Precaution Comments: GJ tube Restrictions Weight Bearing Restrictions: No     Mobility  Bed Mobility               General bed mobility comments: pt seated edge of bed with NT in room. pt engaged in brushing teeth. Provided pt with warm washcloth and pt self washed face.    Transfers Overall transfer level: Needs assistance Equipment used: 1 person hand held assist Transfers: Sit to/from Stand, Bed to chair/wheelchair/BSC Sit  to Stand: Min assist   Step pivot transfers: Mod assist       General transfer comment: min assist to stand from bed in low position with cues for hand placement.  once standing mod assist for 5 pivot steps from bed to recliner with min assist for controlled descent with pt reaching back to arm rests without cues.    Ambulation/Gait           General Gait Details: not performed this session due to lack of secondary support for safety        Cognition Arousal/Alertness: Awake/alert Behavior During Therapy: [name] for tasks assessed/performed Overall Cognitive Status: Within Functional Limits for tasks assessed                 General Comments: pt's hearing aides not charged/working at this time so need for repeated cues for pt to fully hear instructions, otherwise no issues.        Exercises General Exercises - Lower Extremity Long Arc Quad: AROM, Strengthening, Both, 10 reps, Seated, Limitations Long Arc Quad Limitations: with yellow band resistance, cues for slow and controlled movements Heel Slides: AROM, Strengthening, Both, 10 reps, Seated, Limitations Heel Slides Limitations: seated Hamstring curls with red band resistance, cues for slow and controlled movements. Toe Raises: AROM, Strengthening, Both, 15 reps, Seated Heel Raises: AROM, Strengthening, Both, 15 reps, Seated     Pertinent Vitals/Pain Pain Assessment Pain Assessment: No/denies pain     PT Goals (current goals can now be found in the care plan section) Acute Rehab PT Goals Patient Stated Goal: Pt's goal is to be able to walk PT Goal Formulation: With patient  Time For Goal Achievement: 08/28/21 Potential to Achieve Goals: Good Progress towards PT goals: Progressing toward goals    Frequency    Min 3X/week      PT Plan Current plan remains appropriate    AM-PAC PT "6 Clicks" Mobility   Outcome Measure  Help needed turning from your back to your side while in a flat bed without  using bedrails?: A Little Help needed moving from lying on your back to sitting on the side of a flat bed without using bedrails?: A Little Help needed moving to and from a bed to a chair (including a wheelchair)?: A Lot Help needed standing up from a chair using your arms (e.g., wheelchair or bedside chair)?: A Little Help needed to walk in hospital room?: A Lot Help needed climbing 3-5 steps with a railing? : A Lot 6 Click Score: 15    End of Session Equipment Utilized During Treatment: Gait belt Activity Tolerance: Patient tolerated treatment well Patient left: in chair;with call bell/phone within reach;with family/visitor present Nurse Communication: Mobility status PT Visit Diagnosis: Unsteadiness on feet (R26.81)     Time: 9980-6999 PT Time Calculation (min) (ACUTE ONLY): 25 min  Charges:  $Therapeutic Exercise: 8-22 mins $Therapeutic Activity: 8-22 mins                    Willow Ora, PTA, [facility] Acute Rehab Services Office- [phone] 08/19/21, 9:31 AM   Willow Ora 08/19/2021, 9:30 AM

## 2021-08-19 NOTE — Progress Notes (Signed)
Patient ID: William Barron, male   DOB: 1945-04-11, 76 y.o.   MRN: [id number] Pt arrived to 4W09 per wheelchair. Pt transferred to bed. Pt son at bedside. Pt/family educated on rehab and policies reviewed. Pt/family in agreement. Linens changed, assessment and vitals obtained. Blanchable redness noted to pt sacrum, foam applied.   Questions answered. Call light in reach, bed in lowest position. No complications noted. Sheela Stack, LPN

## 2021-08-19 NOTE — Progress Notes (Incomplete)
PROGRESS NOTE    William Barron  ELT:[id number] DOB: [birth year] DOA: 08/11/2021 PCP: [name], MD    Chief Complaint  Patient presents with   Needs SNF placement    Brief Narrative:  William Barron is a 76 y.o. male with medical history significant of pancreatic mass status post Whipple and J/G tube April-May 2023, multiple myeloma, prostate cancer status post radiation therapy, HTN, HLD, CAD, amyloidosis, IDDM who was recently discharged from [name] 5/30 after prolonged hospital stay after whipple procedure.  According to [name]'s documentation, patient has strong family history of pancreatic cancer, decision was made for resection of pancreas.  Whipple was performed and postop pathology extensive involvement by amyloidosis (no definite evidence of IPMN or mucinous cystic neoplasm component is identified) (07/13/2021 pathology - previous note had mentioned pathology showing IPMN, but I don't see this). After surgery, patient was on TPN briefly then JG tube inserted by [name] services. He arrived at [facility] on 5/30 and they were under the impression they could not handle his GJ.  They did not receive his discharge summary until he was en route, per my discussion with thme.  It sounds like they thought he needed continuous suction (though per discharge summary, it was noted that g port could be placed to suction for nausea as needed, but it was mostly to gravity).  They initially asked to go back to [name], but EMS wouldn't take them there.  Due to his continued intractable hiccups and need for care and the belief they were unable to manage him at [facility], he came to the [facility] ED.    Currently medically stable for discharge.  Issues were regarding to confusion initially at SNF, then was sent to ED for intractable hiccups as above (per my discussion with wife, staff at [facility]).  At this time, likely CIR discharge 6/7 depending on bed availability?    Assessment & Plan:  Principal Problem:    H/O Whipple procedure Active Problems:   Intractable hiccups   Candida esophagitis (HCC)   Facial droop   Diabetes (HCC)   Anemia   Hypoalbuminemia   Frequent PVCs   Prolonged QT interval   Amyloidosis (HCC)   Multiple myeloma (HCC)   Asymptomatic bacteriuria   Prostate cancer (HCC)   Hormone replacement therapy   Lesion of parotid gland   Protein-calorie malnutrition, severe (HCC)   Feeding difficulties   Feeding tube dysfunction, initial encounter   Malnutrition of moderate degree    Assessment and Plan: * H/O Whipple procedure Seems like some confusion regarding need for suction with the GJ -> ultimately led to him being admitted here He's s/p open whipple on 5/1 (care everywhere documentation confusing regarding dates of surgery) Post op course complicated by delirium, UTI, recurrent/continued need for NG placement GJ placed on 5/18 At the time of discharge, had continuous feeds via j port and g port was placed gravity mostly (occasionally placed to suction to alleviate nausea and drain stomach) Clear liquids for comfort (pt son had talked to Dr. Hyman Hopes regarding this) Per dc instructions, ok to hook g port to suction of patient bc distended, nauseated or with significant hiccups, should be on continuous j tube feeds    Intractable hiccups Per my discussion with [name] from [employer], intractable hiccups were part of the reason he came [facility] Will stop thorazine and trial gabapentin  Follow, as above, can hook g port to suction as needed for distension or hiccups  Facial droop  Mild L facial droop, he'd complained of confusion on the day of admission.  May have been related to thorazine? MRI brain without acute intracranial path  Candida esophagitis ([place]) Plan for 14 days fluconazole per [name] d/c summary He was discharged with 10 days worth on 5/30, should be complete 6/9  Diabetes ([place]) semglee 6 units with SSI q4  Anemia Trend Low iron, sat  ratios, normal ferritin, folate, b12.  Iron def, aocd? 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 Surgical pathology from 5/1 with hepatic artery LN involved by amyloid, gallbladder involved by amyloid, pancreas, portion of stomach, and duodenum with extensive involvement by amyloidosis, 13 lymph nodes involved by amyloidosis, negative for tumor (see report in care everywhere)  Asymptomatic bacteriuria ESBL klebsiella and citrobacter freundii  Currently asymptomatic Will follow for sx   Multiple myeloma ([place]) ixazomib is on d/c meds from [place] Also on 4 mg dexamethazone once weekly  Holding at this time (he's not currently taking these meds)  Doctor is at [facility]) S/p radiation Will follow with pt/wife  Hormone replacement therapy Resume home testosterone  Lesion of parotid gland Needs ENT follow up outpatient  Concerning for parotid neoplasm  Protein-calorie malnutrition, severe ([place]) Tube feeds RD c/s         DVT prophylaxis:  Code Status:  Family Communication:  Disposition:   Status is: Inpatient {Inpatient:23812}   Consultants:  ***  Procedures:  ***  Antimicrobials:  ***    Subjective: ***  Objective: Vitals:   08/18/21 2054 08/19/21 0500 08/19/21 0600 08/19/21 0737  BP: (!) 110/56  137/60 (!) 141/69  Pulse: 83  76 80  Resp: _0 Temp: 98.1 F (36.7 C)  97.9 F (36.6 C) 98.1 F (36.7 C)  TempSrc:   Oral Oral  SpO2: 98%  99% 97%  Weight:  56.8 kg    Height:        Intake/Output Summary (Last 24 hours) at 08/19/2021 1053 Last data filed at 08/19/2021 0814 Gross per 24 hour  Intake 1789.42 ml  Output 800 ml  Net 989.42 ml   Filed Weights   08/15/21 0438 08/16/21 0500 08/19/21 0500  Weight: 57.6 kg 56.1 kg 56.8 kg     Examination:  General exam: Appears calm and comfortable  Respiratory system: Clear to auscultation. Respiratory effort normal. Cardiovascular system: S1 & S2 heard, RRR. No JVD, murmurs, rubs, gallops or clicks. No pedal edema. Gastrointestinal system: Abdomen is nondistended, soft and nontender. No organomegaly or masses felt. Normal bowel sounds heard. Central nervous system: Alert and oriented. No focal neurological deficits. Extremities: Symmetric 5 x 5 power. Skin: No rashes, lesions or ulcers Psychiatry: Judgement and insight appear normal. Mood & affect appropriate.     Data Reviewed:   CBC: Recent Labs  Lab 08/15/21 0130 08/16/21 0052 08/17/21 0310 08/19/21 0323  WBC 10.9* 12.3* 10.1 9.7  NEUTROABS 8.7* 10.4* 8.2* 7.9*  HGB 8.5* 8.2* 8.6* 8.0*  HCT 26.3* 25.8* 27.4* 25.7*  MCV 85.9 86.0 86.2 86.5  PLT 523* 569* 575* 585*    Basic Metabolic Panel: Recent Labs  Lab 08/12/21 1921 08/13/21 0604 08/15/21 0130 08/16/21 0052 08/17/21 0310 08/19/21 0323  NA  --  132* 134* 132* 136 135  K  --  4.6 4.0  4.1 4.1 3.9  CL  --  99 100 100 101 99  CO2  --  _0 GLUCOSE  --  215* 98 128* 117* 132*  BUN  --  _1 CREATININE  --  0.95 1.00 0.87 0.86 0.97  CALCIUM  --  8.5* 8.3* 8.3* 8.5* 8.6*  MG 2.0  --  1.8  --  1.8 1.9  PHOS  --   --  3.4  --  4.1 3.9    GFR: Estimated Creatinine Clearance: 50.4 mL/min (by C-G formula based on SCr of 0.97 mg/dL).  Liver Function Tests: Recent Labs  Lab 08/15/21 0130 08/17/21 0310 08/19/21 0323  AST _2 ALT 42 40 29  ALKPHOS 626* 595* 539*  BILITOT 0.4 0.5 0.3  PROT 5.4* 5.5* 5.5*  ALBUMIN 2.3* 2.4* 2.3*    CBG: Recent Labs  Lab 08/18/21 1518 08/18/21 2055 08/19/21 0012 08/19/21 0508 08/19/21 0738  GLUCAP 127* 102* 119* 145* 147*     Recent Results (from the past 240 hour(s))  Urine Culture     Status: Abnormal   Collection Time: 08/11/21  8:53 PM   Specimen: Urine, Clean  Catch  Result Value Ref Range Status   Specimen Description URINE, CLEAN CATCH  Final   Special Requests   Final    NONE Performed at [facility], [place] [address], [place], [place] [address]    Culture (A)  Final    70,000 COLONIES/mL KLEBSIELLA PNEUMONIAE Confirmed Extended Spectrum Beta-Lactamase Producer (ESBL).  In bloodstream infections from ESBL organisms, carbapenems are preferred over piperacillin/tazobactam. They are shown to have a lower risk of mortality. 20,000 COLONIES/mL CITROBACTER FREUNDII    Report Status 08/15/2021 FINAL  Final   Organism ID, Bacteria KLEBSIELLA PNEUMONIAE (A)  Final   Organism ID, Bacteria CITROBACTER FREUNDII (A)  Final      Susceptibility   Citrobacter freundii - MIC*    CEFAZOLIN >=64 RESISTANT Resistant     CEFEPIME <=0.12 SENSITIVE Sensitive     CEFTRIAXONE <=0.25 SENSITIVE Sensitive     CIPROFLOXACIN <=0.25 SENSITIVE Sensitive     GENTAMICIN <=1 SENSITIVE Sensitive     IMIPENEM 0.5 SENSITIVE Sensitive     NITROFURANTOIN 32 SENSITIVE Sensitive     TRIMETH/SULFA <=20 SENSITIVE Sensitive     PIP/TAZO 8 SENSITIVE Sensitive     * 20,000 COLONIES/mL CITROBACTER FREUNDII   Klebsiella pneumoniae - MIC*    AMPICILLIN >=32 RESISTANT Resistant     CEFAZOLIN >=64 RESISTANT Resistant     CEFEPIME >=32 RESISTANT Resistant     CEFTRIAXONE >=64 RESISTANT Resistant     CIPROFLOXACIN 2 RESISTANT Resistant     GENTAMICIN >=16 RESISTANT Resistant     IMIPENEM 0.5 SENSITIVE Sensitive     NITROFURANTOIN 64 INTERMEDIATE Intermediate     TRIMETH/SULFA >=320 RESISTANT Resistant     AMPICILLIN/SULBACTAM >=32 RESISTANT Resistant     PIP/TAZO 16 SENSITIVE Sensitive     * 70,000 COLONIES/mL KLEBSIELLA PNEUMONIAE  MRSA Next Gen by PCR, Nasal     Status: None   Collection Time: 08/13/21 10:50 AM   Specimen: Nasal Mucosa; Nasal Swab  Result Value Ref Range Status   MRSA by PCR Next Gen NOT DETECTED NOT DETECTED Final    Comment: (NOTE) The GeneXpert MRSA  Assay (FDA approved for NASAL specimens only), is one component of a comprehensive MRSA colonization surveillance program. It is not intended to diagnose MRSA infection nor to  guide or monitor treatment for MRSA infections. Test performance is not FDA approved in patients less than 35 years old. Performed at [facility], [place] [address], [place], [place] [address]          Radiology Studies: No results found.      Scheduled Meds:  chlorhexidine  15 mL Mouth Rinse BID   enoxaparin (LOVENOX) injection  40 mg Subcutaneous Q24H   ferrous sulfate  220 mg Per Tube QODAY   fluconazole  200 mg Per Tube Daily   gabapentin  100 mg Per Tube Q8H   insulin aspart  0-9 Units Subcutaneous Q4H   insulin glargine-yfgn  6 Units Subcutaneous Daily   mouth rinse  15 mL Mouth Rinse q12n4p   melatonin  6 mg Per Tube QHS   metoprolol tartrate  12.5 mg Per J Tube BID   pantoprazole sodium  40 mg Per Tube Daily   testosterone  10 g Transdermal Daily   Continuous Infusions:  feeding supplement (GLUCERNA 1.5 CAL) 1,000 mL (08/19/21 0020)     LOS: 6 days    Time spent: ***    [name], MD [facility]   To contact the attending provider between 7A-7P or the covering provider during after hours 7P-7A, please log into the web site [email or web address] and access using universal [place] password for that web site. If you do not have the password, please call the hospital operator.  08/19/2021, 10:53 AM

## 2021-08-19 NOTE — Progress Notes (Signed)
Called report to [facility] and spoke with nurse Caryl Pina who will be taking care of the patient once he arrives. Nurse verbalized understanding of report and had no further questions. Nurse stated that she will call back once room in clean and then we will transport patient.

## 2021-08-19 NOTE — Progress Notes (Signed)
PMR Admission Coordinator Pre-Admission Assessment   Patient: GAVINN COLLARD is an 76 y.o., male MRN: [id number] DOB: 24-Oct-1945 Height: '5\' 6"'  (167.6 cm) Weight: 56.8 kg   Insurance Information HMO:     PPO:      PCP:      IPA:      80/20: yes     OTHER:  PRIMARY: Medicare A & B      Policy#: [id number]      Subscriber: patient CM Name:       Phone#:      Fax#:  Pre-Cert#:       Employer:  Benefits:  Phone #: verified eligibility via [place] on 08/15/21     Name:  Eff. Date: Part A & B effective 06/13/08     Deduct: $1,500      Out of Pocket Max: NA      Life Max: NA CIR: 100% coverage      SNF: 100% coverage days 1-20, 80% coverage days 21-100 Outpatient: 80% coverage     Co-Pay: 20% Home Health: 100% coverage      Co-Pay:  DME: 80% coverage     Co-Pay: 20% Providers: pt's choice SECONDARY: BCBS Supplement     Policy#: [id number]     Phone#: [phone]   Financial Counselor:       Phone#:    The "Data Collection Information Summary" for patients in Inpatient Rehabilitation Facilities with attached "Privacy Act [place] Records" was provided and verbally reviewed with: Patient   Emergency Contact Information Contact Information       Name Relation Home Work Mobile    [place] Spouse [phone]   [phone]    DESHON, HSIAO     [phone]           Current Medical History  Patient Admitting Diagnosis: Debility s/p Whipple procedure History of Present Illness: Pt is a 76 year old male with medical hx significant for: HTN, CAD, HLD, amyloidosis, IDDM, multiple myeloma, prostate CA s/p radiation tx, pancreatic mass s/p Whipple and J/G tube placement in April-May 2023, foot surgery March/April 2023. Pt presented to [facility] on 08/11/21 from [facility]. After pt being there for 15 minutes, facility stated they could not care for pt and sent pt to ED. Pt recently had prolonged hospitalization at [name] d/t Whipple procedure on 07/13/21.  Posop pathology showed IPMN (intraductal papillary mucinous neoplasm) Hospital stay complicated by presume UTI, delirium, GJ tube replacements, candidal esophagitis. Dr. Hyman Hopes, his surgeon at [facility], accepted pt to [facility] for readmission. Pt unable to be transferred ED to ED d/t his stability; pt placed on waitlist for a bed at [facility] but pt and wife wanted to be removed from the list.  Due to pt report of abdominal pain in ED, CT performed. CT showed interval resection of pancreatic head and cystic mass without signs of complicating features. Therapy evaluations completed and CIR recommended d/t pt's deficits in functional mobility and inability to complete ADLs independently.   Patient's medical record from [facility] has been reviewed by the rehabilitation admission coordinator and physician.   Past Medical History      Past Medical History:  Diagnosis Date   Amyloidosis ([place])     Diabetes mellitus without complication (HCC)     GERD (gastroesophageal reflux disease)     Hearing loss      Has hearing aids   History of kidney stones     Hyperlipidemia     Multiple myeloma ([place])  and amylodosis    Nephrolithiasis     Occasional tremors     Pneumonia     Prostate cancer ([place])     Prostatitis      acute and chronic   Skin cancer        Has the patient had major surgery during 100 days prior to admission? Yes   Family History   family history includes CAD (age of onset: 64) in his brother; Cancer in his paternal grandmother; Pancreatic cancer in his father; Parkinsonism in his mother; Prostate cancer in his father.   Current Medications   Current Facility-Administered Medications:    chlorhexidine (PERIDEX) 0.12 % solution 15 mL, 15 mL, Mouth Rinse, BID, Elodia Florence., MD, 15 mL at 08/18/21 2201   enoxaparin (LOVENOX) injection 40 mg, 40 mg, Subcutaneous, Q24H, [place], Ping T, MD, 40 mg at 08/18/21 1648   feeding supplement (GLUCERNA 1.5 CAL) liquid 1,000 mL, 1,000  mL, Per Tube, Continuous, Elodia Florence., MD, Last Rate: 65 mL/hr at 08/19/21 0020, 1,000 mL at 08/19/21 0020   ferrous sulfate 220 (44 Fe) MG/5ML solution 220 mg, 220 mg, Per Tube, Hetty Blend., MD, 220 mg at 08/18/21 1024   fluconazole (DIFLUCAN) tablet 200 mg, 200 mg, Per Tube, Daily, Elodia Florence., MD, 200 mg at 08/18/21 1025   gabapentin (NEURONTIN) 250 MG/5ML solution 100 mg, 100 mg, Per Tube, Q8H, Elodia Florence., MD, 100 mg at 08/19/21 0508   insulin aspart (novoLOG) injection 0-9 Units, 0-9 Units, Subcutaneous, Q4H, Elodia Florence., MD, 1 Units at 08/19/21 0746   insulin glargine-yfgn (SEMGLEE) injection 6 Units, 6 Units, Subcutaneous, Daily, Gareth Morgan, MD, 6 Units at 08/18/21 1024   MEDLINE mouth rinse, 15 mL, Mouth Rinse, q12n4p, Elodia Florence., MD, 15 mL at 08/18/21 1648   melatonin tablet 6 mg, 6 mg, Per Tube, QHS, Wynetta Fines T, MD, 6 mg at 08/17/21 2038   metoprolol tartrate (LOPRESSOR) tablet 12.5 mg, 12.5 mg, Per J Tube, BID, Wynetta Fines T, MD, 12.5 mg at 08/18/21 2201   oxyCODONE (Oxy IR/ROXICODONE) immediate release tablet 2.5 mg, 2.5 mg, Per Tube, Q4H PRN, Gareth Morgan, MD, 2.5 mg at 08/13/21 2158   pantoprazole sodium (PROTONIX) 40 mg/20 mL oral suspension 40 mg, 40 mg, Per Tube, Daily, Schlossman, Erin, MD, 40 mg at 08/18/21 1038   phenol (CHLORASEPTIC) mouth spray 1 spray, 1 spray, Mouth/Throat, PRN, Gareth Morgan, MD   testosterone (ANDROGEL) 50 MG/5GM (1%) gel 10 g, 10 g, Transdermal, Daily, Elodia Florence., MD, 10 g at 08/18/21 1024   traZODone (DESYREL) tablet 50 mg, 50 mg, Per Tube, QHS PRN, Lequita Halt, MD, 50 mg at 08/18/21 2208   Patients Current Diet:  Diet Order                  Diet clear liquid Room service appropriate? Yes; Fluid consistency: Thin  Diet effective now                         Precautions / Restrictions Precautions Precautions: Fall Precaution Comments: GJ  tube Restrictions Weight Bearing Restrictions: No    Has the patient had 2 or more falls or a fall with injury in the past year? No   Prior Activity Level   Prior Functional Level Self Care: Did the patient need help bathing, dressing, using the toilet or eating? Independent   Indoor Mobility:  Did the patient need assistance with walking from room to room (with or without device)? Independent   Stairs: Did the patient need assistance with internal or external stairs (with or without device)? Independent (prior to foot surgery. Had chair lift placed in house d/t surgery and weightbearing status)   Functional Cognition: Did the patient need help planning regular tasks such as shopping or remembering to take medications? Independent   Patient Information Are you of Hispanic, Latino/a,or Spanish origin?: A. No, not of Hispanic, Latino/a, or Spanish origin What is your race?: A. White Do you need or want an interpreter to communicate with a doctor or health care staff?: 0. No   Patient's Response To:  Health Literacy and Transportation Is the patient able to respond to health literacy and transportation needs?: Yes Health Literacy - How often do you need to have someone help you when you read instructions, pamphlets, or other written material from your doctor or pharmacy?: Never In the past 12 months, has lack of transportation kept you from medical appointments or from getting medications?: No In the past 12 months, has lack of transportation kept you from meetings, work, or from getting things needed for daily living?: No   Development worker, international aid / [place] Devices/Equipment: None Home Equipment: None   Prior Device Use: Indicate devices/aids used by the patient prior to current illness, exacerbation or injury? None of the above (used a walker temporarily after foot surgery)   Current Functional Level Cognition   Overall Cognitive Status: Within Functional Limits for  tasks assessed Current Attention Level: Sustained Orientation Level: Oriented X4 Following Commands: Follows one step commands with increased time, Follows multi-step commands inconsistently Safety/Judgement: Decreased awareness of safety General Comments: eager to participate    Extremity Assessment (includes Sensation/Coordination)   Upper Extremity Assessment: Defer to OT evaluation  Lower Extremity Assessment: Generalized weakness     ADLs   Overall ADL's : Needs assistance/impaired Eating/Feeding: Set up, Sitting Grooming: Brushing hair, Sitting, Set up Upper Body Bathing: Minimal assistance, Sitting Lower Body Bathing: Moderate assistance, Sitting/lateral leans Upper Body Dressing : Minimal assistance, Sitting Lower Body Dressing: Set up, Sitting/lateral leans Lower Body Dressing Details (indicate cue type and reason): able to cross legs to donn socks and shoes Toilet Transfer: Moderate assistance, Regular Toilet Toilet Transfer Details (indicate cue type and reason): ambulated into bathroom and required mod assist for balance and cues for safety Toileting- Clothing Manipulation and Hygiene: Moderate assistance, Sit to/from stand Toileting - Clothing Manipulation Details (indicate cue type and reason): toilet hygiene performed while standing with assistance for hygiene Functional mobility during ADLs: Moderate assistance, Rolling walker (2 wheels), Cueing for sequencing, Cueing for safety General ADL Comments: increased activity tolerance but continues to require cues for breathing technique     Mobility   Overal bed mobility: Modified Independent Bed Mobility: Sidelying to Sit Sidelying to sit: Min guard Supine to sit: Min guard Sit to supine: Modified independent (Device/Increase time) General bed mobility comments: OOB in recliner on arrival     Transfers   Overall transfer level: Needs assistance Equipment used: Rolling walker (2 wheels) Transfers: Sit to/from Stand,  Bed to chair/wheelchair/BSC Sit to Stand: Min assist, Min guard Bed to/from chair/wheelchair/BSC transfer type:: Step pivot, Stand pivot Stand pivot transfers: Mod assist Step pivot transfers: Mod assist General transfer comment: min assist down to min guard throughout session STS x3, light cues for hand placement     Ambulation / Gait / Stairs / Wheelchair Mobility  Ambulation/Gait Ambulation/Gait assistance: +2 safety/equipment, Mod assist Gait Distance (Feet): 100 Feet (x2) Assistive device: Rollator (4 wheels) Gait Pattern/deviations: Step-through pattern, Decreased stride length, Ataxic, Trunk flexed, Narrow base of support General Gait Details: slow ataxic gait with noted decrease in tremor this session, mod a +2 for very close chair follow for safety, cues throughout for PLB and to breathe generally and more slowly as pt with tendency for holding breath. Gait velocity: decr     Posture / Balance Balance Overall balance assessment: Needs assistance Sitting-balance support: Feet supported, Single extremity supported Sitting balance-Leahy Scale: Fair Standing balance support: Bilateral upper extremity supported, During functional activity, Reliant on assistive device for balance Standing balance-Leahy Scale: Poor Standing balance comment: reliant on RW for support     Special needs/care consideration Diabetic management novoLOG 0-9 units every 4 hours, Semglee 6 units daily.Bladder incontinence, External urinary catheter, Peg-jejunostomy tube    Previous Home Environment (from acute therapy documentation) Living Arrangements: Spouse/significant other  Lives With: Spouse Available Help at Discharge: Family, Available 24 hours/day (family going to hire additional help) Type of Home: House Home Layout: Two level, 1/2 bath on main level Alternate Level Stairs-Rails: Left Alternate Level Stairs-Number of Steps: has chair lift Home Access: Stairs to enter Entrance Stairs-Rails:  Right (rails on right in [place]. Rails on right and left but can't reach at same time in front of house) Technical brewer of Steps: 3 Bathroom Shower/Tub: Multimedia programmer: Standard Bathroom Accessibility: Yes How Accessible: Accessible via walker [place]: No Additional Comments: Has been using a RW at [facility] for transfers   Discharge Living Setting Plans for Discharge Living Setting: Patient's home Type of Home at Discharge: House Discharge Home Layout: Two level, 1/2 bath on main level Alternate Level Stairs-Number of Steps: has chair lift Discharge Home Access: Stairs to enter Entrance Stairs-Rails: Right (Right side for carport. Rails on the right and left side but cannot reach at the same time in front of house.) Entrance Stairs-Number of Steps: 3 Discharge Bathroom Shower/Tub: Walk-in shower Discharge Bathroom Toilet: Standard Discharge Bathroom Accessibility: Yes How Accessible: Accessible via walker Does the patient have any problems obtaining your medications?: No   Social/Family/Support Systems Anticipated Caregiver: Nazaiah Navarrete, wife and hired help Anticipated Ambulance person Information: [phone] Caregiver Availability: 24/7 Discharge Plan Discussed with Primary Caregiver: Yes Is Caregiver In Agreement with Plan?: Yes Does Caregiver/Family have Issues with Lodging/Transportation while Pt is in Rehab?: No   Goals Patient/Family Goal for Rehab: Supervision: PT/OT Expected length of stay: 7-10 days Pt/Family Agrees to Admission and willing to participate: Yes Program Orientation Provided & Reviewed with Pt/Caregiver Including Roles  & Responsibilities: Yes   Decrease burden of Care through IP rehab admission: NA   Possible need for SNF placement upon discharge: Not anticipated   Patient Condition: I have reviewed medical records from [facility], spoken with CSW, and patient and son. I met with patient at the bedside  for inpatient rehabilitation assessment.  Patient will benefit from ongoing PT and OT, can actively participate in 3 hours of therapy a day 5 days of the week, and can make measurable gains during the admission.  Patient will also benefit from the coordinated team approach during an Inpatient Acute Rehabilitation admission.  The patient will receive intensive therapy as well as Rehabilitation physician, nursing, social worker, and care management interventions.  Due to bladder management, safety, disease management, medication administration, pain management, and patient education the patient requires 24 hour a day  rehabilitation nursing.  The patient is currently Min A with mobility and basic ADLs.  Discharge setting and therapy post discharge at home with home health is anticipated.  Patient has agreed to participate in the Acute Inpatient Rehabilitation Program and will admit today.   Preadmission Screen Completed By:  Bethel Born, 08/19/2021 8:24 AM ______________________________________________________________________   Discussed status with Dr. Curlene Dolphin on 08/19/21 at 31 and received approval for admission today.   Admission Coordinator:  Bethel Born, CCC-SLP, time 944/Date 08/19/21    Assessment/Plan: Diagnosis:Debility s/p Whipple procedure Does the need for close, 24 hr/day Medical supervision in concert with the patient's rehab needs make it unreasonable for this patient to be served in a less intensive setting? Yes Co-Morbidities requiring supervision/potential complications: DM, GERD, HLD, Multiple Myeloma, Prostate Cancer Due to bladder management, bowel management, safety, skin/wound care, disease management, medication administration, pain management, and patient education, does the patient require 24 hr/day rehab nursing? Yes Does the patient require coordinated care of a physician, rehab nurse, PT, OT, and SLP to address physical and functional deficits in the  context of the above medical diagnosis(es)? Yes Addressing deficits in the following areas: balance, endurance, locomotion, strength, transferring, bowel/bladder control, bathing, dressing, feeding, grooming, and toileting Can the patient actively participate in an intensive therapy program of at least 3 hrs of therapy 5 days a week? Yes The potential for patient to make measurable gains while on inpatient rehab is good Anticipated functional outcomes upon discharge from inpatient rehab: supervision PT, supervision OT, n/a SLP Estimated rehab length of stay to reach the above functional goals is: 7-10 Anticipated discharge destination: Home 10. Overall Rehab/Functional Prognosis: good     MD Signature: Jennye Boroughs

## 2021-08-19 NOTE — Discharge Summary (Signed)
Physician Discharge Summary  William Barron TGG:[id number] DOB: 06-04-1945 DOA: 08/11/2021  PCP: [name], MD  Admit date: 08/11/2021 Discharge date: 08/19/2021  Time spent: 60 minutes  Recommendations for Outpatient Follow-up:  Patient was discharged to inpatient rehab. Follow-up with general surgery at [facility] once discharged from inpatient rehab as scheduled. Follow-up with [name], MD in 2 weeks post discharge from inpatient rehab.   Discharge Diagnoses:  Principal Problem:   H/O Whipple procedure Active Problems:   Intractable hiccups   Candida esophagitis (HCC)   Facial droop   Diabetes (HCC)   Anemia   Hypoalbuminemia   Frequent PVCs   Prolonged QT interval   Amyloidosis (HCC)   Multiple myeloma (HCC)   Asymptomatic bacteriuria   Prostate cancer (HCC)   Hormone replacement therapy   Lesion of parotid gland   Protein-calorie malnutrition, severe (HCC)   Feeding difficulties   Feeding tube dysfunction, initial encounter   Malnutrition of moderate degree   Jejunostomy tube present [facility])   Discharge Condition: Stable  Diet recommendation: On tube feeds  Filed Weights   08/15/21 0438 08/16/21 0500 08/19/21 0500  Weight: 57.6 kg 56.1 kg 56.8 kg    History of present illness:  HPI per Dr. Annitta Needs is a 76 y.o. male with medical history significant of pancreatic mass status post Whipple and J/G tube April-May 2023, multiple myeloma, prostate cancer status post radiation therapy, HTN, HLD, CAD, amyloidosis, IDDM, sent from nursing home for [facility] evaluation.   Patient was admitted to [facility] end of April for finding of pancreatic lesion.  According to [name]'s documentation, patient has strong family history of pancreatic cancer, decision was made for resection of pancreas.  Whipple was performed and postop pathology showed IPMN (intraductal papillary mucinous neoplasm ). After surgery, patient was on TPN briefly then JG tube inserted  by [name] services.  J tube feeding started without issue, G tube has been used for continuous suction.  [facility] plan was to discharge patient to SNF, but on arrival at SNF, it was found that SNF cannot provide a G-tube with continuous suction.  As result patient sent back to ED, [name] ED was full and no bed available, patient came to [facility] ED.  The only complaint patient has so far and is the frequent hiccups after bowel Whipple, denies any nauseous vomiting no diarrhea.  He has had mild diffuse abdominal pain, chronically, and no significant changes.   ED Course: Stable no acute issue.  CT abdomen pelvis postop changes of pancreas no acute findings otherwise. Hospital Course:   Assessment and Plan: * H/O Whipple procedure Seems like some confusion regarding need for suction with the GJ -> ultimately led to him being admitted here He's s/p open whipple on 5/1 (care everywhere documentation confusing regarding dates of surgery) Post op course complicated by delirium, UTI, recurrent/continued need for NG placement GJ placed on 5/18 At the time of discharge, had continuous feeds via j port and g port was placed gravity mostly (occasionally placed to suction to alleviate nausea and drain stomach) Clear liquids for comfort (pt son had talked to Dr. Hyman Hopes regarding this) Per dc instructions, ok to hook g port to suction of patient bc distended, nauseated or with significant hiccups, should be on continuous j tube feeds. -Patient medically stable for discharge to CIR.    Intractable hiccups Per Dr. Abel Presto, discussion with [name] from [place], intractable hiccups were part of the reason he [facility]  [place] Thorazine discontinued and patient placed on a trial gabapentin. -Hiccups improved. Follow, as above, can hook g port to suction as needed for distension or hiccups. -Patient medically stable and discharged to CIR.  Facial droop Mild L facial droop, he'd complained of confusion on the  day of admission.  May have been related to thorazine? MRI brain without acute intracranial path. -Outpatient follow-up.  Candida esophagitis ([place]) Patient placed on 14 days fluconazole per [name] d/c summary He was discharged with 10 days worth on 5/30, should be complete 6/9. -Patient will be discharged to CIR.  Diabetes ([place]) Patient maintained on semglee 6 units with SSI q4. -Patient to be discharged to inpatient rehab  Anemia Hemoglobin remained stable and was 8.0 by day of discharge.   Low iron, sat ratios, normal ferritin, folate, b12.  Iron def, aocd? Patient was placed on oral iron supplementation.   Hypoalbuminemia Patient noted to be on tube feeds. -Outpatient follow-up.  Frequent PVCs Patient placed on telemetry.   -Patient started on metoprolol.   -Outpatient follow-up.    Prolonged QT interval - Electrolytes repleted. -Resolved.  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 -Outpatient follow-up. -Patient will be discharged to CIR.  Asymptomatic bacteriuria ESBL klebsiella and citrobacter freundii  Currently asymptomatic. -No need for antibiotics. -Patient will be discharged to CIR.   Multiple myeloma (HCC) ixazomib is on d/c meds from [name] Also on 4 mg dexamethazone once weekly  Holding at this time (he's not currently taking these meds)  Doctor is at [place] follow-up.  Prostate cancer [facility]) S/p radiation Outpatient follow-up.  Hormone replacement therapy Resumed home testosterone -Patient to discharge to CIR. -Outpatient follow-up.  Lesion of parotid gland Needs ENT follow up outpatient  Concerning for parotid  neoplasm  Protein-calorie malnutrition, severe ([place]) Tube feeds RD c/s and follow the patient during the hospitalization. -Outpatient follow-up.        Procedures: None  Consultations: None  Discharge Exam: Vitals:   08/19/21 0600 08/19/21 0737  BP: 137/60 (!) 141/69  Pulse: 76 80  Resp: 18 18  Temp: 97.9 F (36.6 C) 98.1 F (36.7 C)  SpO2: 99% 97%    General: NAD. Cardiovascular: Regular rate rhythm no murmurs rubs or gallops.  No JVD.  No lower extremity edema. Respiratory: Clear to auscultation bilaterally.  No wheezes, no crackles, no rhonchi.  Discharge Instructions     Allergies  Allergen Reactions   Penicillins Swelling and Rash    Pt received ancef on 04-15-2021 without issue    Follow-up Information     [name], MD. Schedule an appointment as soon as possible for a visit in 2 week(s).   Specialty: Internal Medicine Contact information: [place] [address] [phone]         General surgery at [facility]. Schedule an appointment as soon as possible for a visit.   Why: Follow-up as scheduled.                 The results of significant diagnostics from this hospitalization (including imaging, microbiology, ancillary and laboratory) are listed below for reference.    Significant Diagnostic Studies: MR BRAIN WO CONTRAST  Result Date: 08/13/2021 CLINICAL DATA:  Neuro deficit, stroke suspected EXAM:  MRI HEAD WITHOUT CONTRAST TECHNIQUE: Multiplanar, multiecho pulse sequences of the brain and surrounding structures were obtained without intravenous contrast. COMPARISON:  CT head 02/08/2021, MR head 01/16/2018 FINDINGS: Brain: There is no acute intracranial hemorrhage, extra-axial fluid collection, or acute infarct There is mild global parenchymal volume loss with prominence of the ventricular system and extra-axial CSF spaces. The ventricles are stable in size since 2019. Patchy foci of FLAIR signal abnormality  throughout the subcortical and periventricular white matter are overall unchanged, nonspecific but likely reflecting sequela of chronic white matter microangiopathy. There is no suspicious parenchymal signal abnormality. There is no mass lesion. There is no mass effect or midline shift. Vascular: Normal flow voids. Skull and upper cervical spine: Normal marrow signal. Sinuses/Orbits: Paranasal sinuses are clear. The globes and orbits are unremarkable. Other: There is an ovoid lesion in the region of the right parotid gland measuring 1.4 cm x 1.9 cm (5-2, 10-10). This lesion was likely present on the study from 2019 but is increased in size IMPRESSION: 1. No acute intracranial pathology. 2. Mild parenchymal volume loss and chronic white matter microangiopathy. 3. 1.9 cm lesion in the region of the right parotid gland is increased in size since 2019 suspicious for parotid neoplasm. Recommend nonemergent ENT referral. Electronically Signed   By: Valetta Mole M.D.   On: 08/13/2021 12:40   CT ABDOMEN PELVIS W CONTRAST  Result Date: 08/11/2021 CLINICAL DATA:  Abdominal pain.  Recent Whipple procedure. EXAM: CT ABDOMEN AND PELVIS WITH CONTRAST TECHNIQUE: Multidetector CT imaging of the abdomen and pelvis was performed using the standard protocol following bolus administration of intravenous contrast. RADIATION DOSE REDUCTION: This exam was performed according to the departmental dose-optimization program which includes automated exposure control, adjustment of the mA and/or kV according to patient size and/or use of iterative reconstruction technique. CONTRAST:  160m OMNIPAQUE IOHEXOL 300 MG/ML  SOLN COMPARISON:  None Available. FINDINGS: Lower chest: Bulky partially calcified adenopathy in the mediastinum and bilateral hila, unchanged since prior study. Coronary artery and aortic calcifications. Coarsened calcifications in the lingula. Bibasilar atelectasis. No effusions. Hepatobiliary: Prior cholecystectomy.  No  focal hepatic abnormality. Pancreas: Interval resection of previously seen pancreatic head cystic mass. Pancreatic body and tail remain present where there are extensive calcifications, unchanged since prior study. Spleen: No focal abnormality or ductal dilatation. Adrenals/Urinary Tract: Punctate nonobstructing stones in the left kidney. No ureteral stones or hydronephrosis. Adrenal glands and urinary bladder unremarkable. Stomach/Bowel: Feeding tube within the stomach. Nonobstructive bowel gas pattern. Vascular/Lymphatic: Aortic atherosclerosis. No evidence of aneurysm or adenopathy. Reproductive: Prostate enlargement. Other: No free fluid or free air. Small left inguinal hernia containing fat and fluid. Musculoskeletal: No acute bony abnormality. IMPRESSION: Interval resection of pancreatic head and associated pancreatic head cystic mass. No visible complicating feature. Pancreatic body and tail remain present with extensive calcifications, unchanged since prior study. No complicating postoperative findings. Bulky mediastinal and bilateral hilar adenopathy with stippled calcifications again noted, unchanged since prior study. Aortic atherosclerosis. Prostate enlargement. Electronically Signed   By: KRolm BaptiseM.D.   On: 08/11/2021 21:50   DG Chest Portable 1 View  Result Date: 08/11/2021 CLINICAL DATA:  Cough. EXAM: PORTABLE CHEST 1 VIEW COMPARISON:  Chest x-ray dated March 05, 2021. FINDINGS: The heart size and mediastinal contours are within normal limits. Normal pulmonary vascularity. Diffuse tiny interstitial nodularity is unchanged. Chronic scarring at the left lung base and costophrenic angle. No focal consolidation, pleural effusion, or pneumothorax. No acute osseous abnormality. Small bilateral calcified axillary lymph nodes  again noted. Gastrostomy tube projecting over the left upper quadrant. IMPRESSION: 1. No active disease. 2. Similar chronic interstitial lung disease. Electronically Signed    By: Titus Dubin M.D.   On: 08/11/2021 19:22    Microbiology: Recent Results (from the past 240 hour(s))  Urine Culture     Status: Abnormal   Collection Time: 08/11/21  8:53 PM   Specimen: Urine, Clean Catch  Result Value Ref Range Status   Specimen Description URINE, CLEAN CATCH  Final   Special Requests   Final    NONE Performed at [facility], [place] [address], [place], [place] [address]    Culture (A)  Final    70,000 COLONIES/mL KLEBSIELLA PNEUMONIAE Confirmed Extended Spectrum Beta-Lactamase Producer (ESBL).  In bloodstream infections from ESBL organisms, carbapenems are preferred over piperacillin/tazobactam. They are shown to have a lower risk of mortality. 20,000 COLONIES/mL CITROBACTER FREUNDII    Report Status 08/15/2021 FINAL  Final   Organism ID, Bacteria KLEBSIELLA PNEUMONIAE (A)  Final   Organism ID, Bacteria CITROBACTER FREUNDII (A)  Final      Susceptibility   Citrobacter freundii - MIC*    CEFAZOLIN >=64 RESISTANT Resistant     CEFEPIME <=0.12 SENSITIVE Sensitive     CEFTRIAXONE <=0.25 SENSITIVE Sensitive     CIPROFLOXACIN <=0.25 SENSITIVE Sensitive     GENTAMICIN <=1 SENSITIVE Sensitive     IMIPENEM 0.5 SENSITIVE Sensitive     NITROFURANTOIN 32 SENSITIVE Sensitive     TRIMETH/SULFA <=20 SENSITIVE Sensitive     PIP/TAZO 8 SENSITIVE Sensitive     * 20,000 COLONIES/mL CITROBACTER FREUNDII   Klebsiella pneumoniae - MIC*    AMPICILLIN >=32 RESISTANT Resistant     CEFAZOLIN >=64 RESISTANT Resistant     CEFEPIME >=32 RESISTANT Resistant     CEFTRIAXONE >=64 RESISTANT Resistant     CIPROFLOXACIN 2 RESISTANT Resistant     GENTAMICIN >=16 RESISTANT Resistant     IMIPENEM 0.5 SENSITIVE Sensitive     NITROFURANTOIN 64 INTERMEDIATE Intermediate     TRIMETH/SULFA >=320 RESISTANT Resistant     AMPICILLIN/SULBACTAM >=32 RESISTANT Resistant     PIP/TAZO 16 SENSITIVE Sensitive     * 70,000 COLONIES/mL KLEBSIELLA PNEUMONIAE  MRSA Next Gen by PCR, Nasal      Status: None   Collection Time: 08/13/21 10:50 AM   Specimen: Nasal Mucosa; Nasal Swab  Result Value Ref Range Status   MRSA by PCR Next Gen NOT DETECTED NOT DETECTED Final    Comment: (NOTE) The GeneXpert MRSA Assay (FDA approved for NASAL specimens only), is one component of a comprehensive MRSA colonization surveillance program. It is not intended to diagnose MRSA infection nor to guide or monitor treatment for MRSA infections. Test performance is not FDA approved in patients less than 70 years old. Performed at [facility], [place] [address], [place], [place] [address]      Labs: Basic Metabolic Panel: Recent Labs  Lab 08/12/21 1921 08/13/21 0604 08/15/21 0130 08/16/21 0052 08/17/21 0310 08/19/21 0323  NA  --  132* 134* 132* 136 135  K  --  4.6 4.0 4.1 4.1 3.9  CL  --  99 100 100 101 99  CO2  --  _0 GLUCOSE  --  215* 98 128* 117* 132*  BUN  --  _1 CREATININE  --  0.95 1.00 0.87 0.86 0.97  CALCIUM  --  8.5* 8.3* 8.3* 8.5* 8.6*  MG 2.0  --  1.8  --  1.8 1.9  PHOS  --   --  3.4  --  4.1 3.9   Liver Function Tests: Recent Labs  Lab 08/15/21 0130 08/17/21 0310 08/19/21 0323  AST _0 ALT 42 40 29  ALKPHOS 626* 595* 539*  BILITOT 0.4 0.5 0.3  PROT 5.4* 5.5* 5.5*  ALBUMIN 2.3* 2.4* 2.3*   No results for input(s): LIPASE, AMYLASE in the last 168 hours. No results for input(s): AMMONIA in the last 168 hours. CBC: Recent Labs  Lab 08/15/21 0130 08/16/21 0052 08/17/21 0310 08/19/21 0323  WBC 10.9* 12.3* 10.1 9.7  NEUTROABS 8.7* 10.4* 8.2* 7.9*  HGB 8.5* 8.2* 8.6* 8.0*  HCT 26.3* 25.8* 27.4* 25.7*  MCV 85.9 86.0 86.2 86.5  PLT 523* 569* 575* 585*   Cardiac Enzymes: No results for input(s): CKTOTAL, CKMB, CKMBINDEX, TROPONINI in the last 168 hours. BNP: BNP (last 3 results) No results for input(s): BNP in the last 8760 hours.  ProBNP (last 3 results) No results for input(s): PROBNP in the last 8760  hours.  CBG: Recent Labs  Lab 08/18/21 2055 08/19/21 0012 08/19/21 0508 08/19/21 0738 08/19/21 1150  GLUCAP 102* 119* 145* 147* 203*       Signed:  [name] MD.  [facility] 08/19/2021, 3:58 PM

## 2021-08-19 NOTE — H&P (Signed)
Physical Medicine and Rehabilitation Admission H&P        Chief Complaint  Patient presents with   Debility      HPI:  William Barron is a 76 year old male with history of amyloidosis, prostate CA s/p XRT, T2DM, CAD, pancreatic mass (IPMN) s/p Whipple with J/G tube at [facility] complicated by delirium, UTI, candidal esophagitis and need for continuous suction via G tube due to intractable hiccups?.  He was discharged to [facility] who was unable to provide care needed (due to G tube used for continuous suction) and sent back to ED. [facility] had no beds so he was admitted to [facility] 08/12/21 for management and reports of increase in abdominal pain, hiccups and cough.  Thorazine changed to gabapentin and G port hooked to suction prn distension/hiccups and started on clears for comfort.  He is tolerating continuous tube feeds via J tube. MRI brain done due to reports of facial droop/confusion on 06/03 and was negative for acute changes. Son reports his facial expression appears unchanged from prior. PT/OT working with patient and he continues to be limited by fatigue, ataxic gait and SOB due to anxiety/tendency to hold his breath. CIR recommended due to functional decline. He reports history of chronic essential tremor and orthostatic tremor. He has difficulty standing but less trouble with movement and ambulation due to this tremor.        ROS  Review of Systems  Constitutional: Negative.   HENT: Negative.    Eyes:  Positive for double vision.  Respiratory: Negative.    Cardiovascular: Negative.   Gastrointestinal:  Negative for abdominal pain and vomiting.  Genitourinary: Negative.   Skin: Negative.   Neurological:  Negative for sensory change and focal weakness.         Past Medical History:  Diagnosis Date   Amyloidosis ([place])     Diabetes mellitus without complication ([place])     GERD (gastroesophageal reflux disease)     Hearing loss      Has hearing aids   History of kidney stones      Hyperlipidemia     Multiple myeloma ([facility])      and amylodosis    Nephrolithiasis     Occasional tremors     Pneumonia     Prostate cancer ([place])     Prostatitis      acute and chronic   Skin cancer           Past Surgical History:  Procedure Laterality Date   APPENDECTOMY       CARDIAC CATHETERIZATION N/A 06/13/2015    Procedure: Left Heart Cath and Coronary Angiography;  Surgeon: Jettie Booze, MD;  Location: [facility];  Service: Cardiovascular;  Laterality: N/A;   COLONOSCOPY   04/07/2000   ELBOW SURGERY   03/15/2001    right   EXTRACORPOREAL SHOCK WAVE LITHOTRIPSY Left 07/17/2018    Procedure: EXTRACORPOREAL SHOCK WAVE LITHOTRIPSY (ESWL);  Surgeon: [name], MD;  Location: [facility];  Service: Urology;  Laterality: Left;   EXTRACORPOREAL SHOCK WAVE LITHOTRIPSY Right 12/08/2020    Procedure: RIGHT EXTRACORPOREAL SHOCK WAVE LITHOTRIPSY (ESWL);  Surgeon: Raynelle Bring, MD;  [facility];  Service: Urology;  Laterality: Right;   FOOT ARTHRODESIS Right 04/15/2021    Procedure: RIGHT SUBTALAR AND TALONAVICULAR FUSION;  Surgeon: Newt Minion, MD;  Location: [facility];  Service: Orthopedics;  Laterality: Right;   HERNIA REPAIR       ROTATOR CUFF  REPAIR       WHIPPLE PROCEDURE   07/13/2021         Family History  Problem Relation Age of Onset   Pancreatic cancer Father     Prostate cancer Father     Parkinsonism Mother     CAD Brother 45   Cancer Paternal Grandmother     Breast cancer Neg Hx     Colon cancer Neg Hx      Social History:  reports that he quit smoking about 44 years ago. His smoking use included cigarettes. He has a 60.00 pack-year smoking history. He has never used smokeless tobacco. He reports current alcohol use of about 3.0 standard drinks per week. He reports that he does not use drugs. Allergies:       Allergies  Allergen Reactions   Penicillins Swelling and Rash      Pt received ancef on 04-15-2021 without issue           Medications Prior to Admission  Medication Sig Dispense Refill   fluconazole (DIFLUCAN) 40 MG/ML suspension Place 200 mg into feeding tube See admin instructions. Qd x 10 days       Insulin Glargine (SEMGLEE ) Inject 6 Units into the skin daily at 12 noon.       insulin regular (NOVOLIN R) 100 units/mL injection Inject 5 Units into the skin See admin instructions. Every 6 hours while continuous tube feeds are running       melatonin 3 MG TABS tablet Place 6 mg into feeding tube at bedtime.       omeprazole (FIRST-OMEPRAZOLE) 2 mg/mL SUSP oral suspension Place 20 mg into feeding tube daily.       oxyCODONE (ROXICODONE) 5 MG/5ML solution Place 2.5 mg into feeding tube See admin instructions. Every 4 hours as need for pain up to 5 days       Testosterone 20.25 MG/ACT (1.62%) GEL Apply 4 Pump topically daily.       traZODone (DESYREL) 50 MG tablet Place 50 mg into feeding tube at bedtime as needed for sleep.       HYDROcodone-acetaminophen (NORCO/VICODIN) 5-325 MG tablet Take 1 tablet by mouth every 4 (four) hours as needed. (Patient not taking: Reported on 07/08/2021) 30 tablet 0          Home: [place] expects to be discharged to:: Other (Comment) (Was home with spouse prior to surgery on 5/1.  Has been at [facility] since procedure.) Living Arrangements: Spouse/significant other Available Help at Discharge: Family, Available 24 hours/day (family going to hire additional help) Type of Home: House Home Access: Stairs to enter [employer] of Steps: 3 Entrance Stairs-Rails: Right (rails on right in [place]. Rails on right and left but can't reach at same time in front of house) Home Layout: Two level, 1/2 bath on main level Alternate Level Stairs-Number of Steps: has chair lift Alternate Level Stairs-Rails: Left Bathroom Shower/Tub: Multimedia programmer: Standard Bathroom Accessibility: Yes Home Equipment: None Additional Comments: Has been using a  RW at [employer] for transfers  Lives With: Spouse   Functional History: Prior Function Prior Level of Function : Needs assist Mobility Comments: Pt reports he was weak prior to whipple secondary to foot operation 2 months prior limiting mobility.  Since whipple, has been able to transfer to EOB with mod I and perform sit > stand and transfer with RW and "a lot" of assist per pt.  Has not been ambulating. ADLs Comments: Pt has been  having assist with ADLs for last month   Functional Status:  Mobility: Bed Mobility Overal bed mobility: Modified Independent Bed Mobility: Sidelying to Sit Sidelying to sit: Min guard Supine to sit: Min guard Sit to supine: Modified independent (Device/Increase time) General bed mobility comments: pt seated edge of bed with NT in room. pt engaged in brushing teeth. Provided pt with warm washcloth and pt self washed face. Transfers Overall transfer level: Needs assistance Equipment used: 1 person hand held assist Transfers: Sit to/from Stand, Bed to chair/wheelchair/BSC Sit to Stand: Min assist Bed to/from chair/wheelchair/BSC transfer type:: Stand pivot Stand pivot transfers: Mod assist Step pivot transfers: Mod assist General transfer comment: min assist to stand from bed in low position with cues for hand placement.  once standing mod assist for 5 pivot steps from bed to recliner with min assist for controlled descent with pt reaching back to arm rests without cues. Ambulation/Gait Ambulation/Gait assistance: +2 safety/equipment, Mod assist Gait Distance (Feet): 100 Feet (x2) Assistive device: Rollator (4 wheels) Gait Pattern/deviations: Step-through pattern, Decreased stride length, Ataxic, Trunk flexed, Narrow base of support General Gait Details: not performed this session due to lack of secondary support for safety Gait velocity: decr   ADL: ADL Overall ADL's : Needs assistance/impaired Eating/Feeding: Set up, Sitting Grooming: Brushing hair,  Sitting, Set up Upper Body Bathing: Minimal assistance, Sitting Lower Body Bathing: Moderate assistance, Sitting/lateral leans Upper Body Dressing : Minimal assistance, Sitting Lower Body Dressing: Set up, Sitting/lateral leans Lower Body Dressing Details (indicate cue type and reason): able to cross legs to donn socks and shoes Toilet Transfer: Moderate assistance, Regular Toilet Toilet Transfer Details (indicate cue type and reason): ambulated into bathroom and required mod assist for balance and cues for safety Toileting- Clothing Manipulation and Hygiene: Moderate assistance, Sit to/from stand Toileting - Clothing Manipulation Details (indicate cue type and reason): toilet hygiene performed while standing with assistance for hygiene Functional mobility during ADLs: Moderate assistance, Rolling walker (2 wheels), Cueing for sequencing, Cueing for safety General ADL Comments: increased activity tolerance but continues to require cues for breathing technique   Cognition: Cognition Overall Cognitive Status: Within Functional Limits for tasks assessed Orientation Level: Oriented X4 Cognition Arousal/Alertness: Awake/alert Behavior During Therapy: WFL for tasks assessed/performed Overall Cognitive Status: Within Functional Limits for tasks assessed Area of Impairment: Orientation, Attention, Memory, Following commands, Safety/judgement, Awareness, Problem solving Orientation Level: Time (didnt not state exact date, but otherwise oriented) Current Attention Level: Sustained Memory: Decreased recall of precautions Following Commands: Follows one step commands with increased time, Follows multi-step commands inconsistently Safety/Judgement: Decreased awareness of safety Awareness: Emergent Problem Solving: Slow processing, Decreased initiation, Difficulty sequencing, Requires verbal cues, Requires tactile cues General Comments: pt's hearing aides not charged/working at this time so need for  repeated cues for pt to fully hear instructions, otherwise no issues.   Physical Exam: Blood pressure (!) 141/69, pulse 80, temperature 98.1 F (36.7 C), temperature source Oral, resp. rate 18, height '5\' 6"'  (1.676 m), weight 56.8 kg, SpO2 97 %. Physical Exam   General: Alert and oriented x 3, No apparent distress, sitting in chair HEENT: Head is normocephalic, atraumatic, PERRLA, EOMI, sclera anicteric, oral mucosa pink and moist Neck: Supple  Heart: Reg rate and rhythm. No murmurs rubs or gallops Chest: CTA bilaterally without wheezes, rales, or rhonchi; no distress Abdomen: Soft, non-tender, non-distended, bowel sounds positive. Midline incision well healed, J tube in place, no signs of infection around tube site noted Extremities: No clubbing, cyanosis, or edema. Pulses  are 2+ Psych: Pt's affect is appropriate. Pt is cooperative, pleasant GU: external Cath, yellow urine in tube Skin: Clean and intact without signs of breakdown Neuro:  CN 2-12 intact other than CN VI deficit R eye, follows commands and answers questions, sensation intact all 4 extremities, intention tremor in b/l UEs Musculoskeletal: Strength RUE 4+/5, LUE 4+/5 RLE 4/5 hip flexion, 4+/5 knee extension and distally RLE 4/5 hip flexion, 4+/5 knee extension and distally No joint swelling or tenderness noted No ROM deficits noted Tone normal   Lab Results Last 48 Hours        Results for orders placed or performed during the hospital encounter of 08/11/21 (from the past 48 hour(s))  Glucose, capillary     Status: Abnormal    Collection Time: 08/17/21  1:07 PM  Result Value Ref Range    Glucose-Capillary 202 (H) 70 - 99 mg/dL      Comment: Glucose reference range applies only to samples taken after fasting for at least 8 hours.  Glucose, capillary     Status: Abnormal    Collection Time: 08/17/21  4:27 PM  Result Value Ref Range    Glucose-Capillary 198 (H) 70 - 99 mg/dL      Comment: Glucose reference range  applies only to samples taken after fasting for at least 8 hours.  Glucose, capillary     Status: Abnormal    Collection Time: 08/17/21  7:37 PM  Result Value Ref Range    Glucose-Capillary 161 (H) 70 - 99 mg/dL      Comment: Glucose reference range applies only to samples taken after fasting for at least 8 hours.  Glucose, capillary     Status: Abnormal    Collection Time: 08/18/21 12:01 AM  Result Value Ref Range    Glucose-Capillary 101 (H) 70 - 99 mg/dL      Comment: Glucose reference range applies only to samples taken after fasting for at least 8 hours.  Glucose, capillary     Status: None    Collection Time: 08/18/21 12:47 AM  Result Value Ref Range    Glucose-Capillary 97 70 - 99 mg/dL      Comment: Glucose reference range applies only to samples taken after fasting for at least 8 hours.  Glucose, capillary     Status: Abnormal    Collection Time: 08/18/21  4:23 AM  Result Value Ref Range    Glucose-Capillary 152 (H) 70 - 99 mg/dL      Comment: Glucose reference range applies only to samples taken after fasting for at least 8 hours.  Glucose, capillary     Status: Abnormal    Collection Time: 08/18/21  8:47 AM  Result Value Ref Range    Glucose-Capillary 189 (H) 70 - 99 mg/dL      Comment: Glucose reference range applies only to samples taken after fasting for at least 8 hours.  Glucose, capillary     Status: Abnormal    Collection Time: 08/18/21 12:12 PM  Result Value Ref Range    Glucose-Capillary 225 (H) 70 - 99 mg/dL      Comment: Glucose reference range applies only to samples taken after fasting for at least 8 hours.  Glucose, capillary     Status: Abnormal    Collection Time: 08/18/21  3:18 PM  Result Value Ref Range    Glucose-Capillary 127 (H) 70 - 99 mg/dL      Comment: Glucose reference range applies only to samples taken after fasting for at  least 8 hours.  Glucose, capillary     Status: Abnormal    Collection Time: 08/18/21  8:55 PM  Result Value Ref Range     Glucose-Capillary 102 (H) 70 - 99 mg/dL      Comment: Glucose reference range applies only to samples taken after fasting for at least 8 hours.  Glucose, capillary     Status: Abnormal    Collection Time: 08/19/21 12:12 AM  Result Value Ref Range    Glucose-Capillary 119 (H) 70 - 99 mg/dL      Comment: Glucose reference range applies only to samples taken after fasting for at least 8 hours.  CBC with Differential/Platelet     Status: Abnormal    Collection Time: 08/19/21  3:23 AM  Result Value Ref Range    WBC 9.7 4.0 - 10.5 K/uL    RBC 2.97 (L) 4.22 - 5.81 MIL/uL    Hemoglobin 8.0 (L) 13.0 - 17.0 g/dL    HCT 25.7 (L) 39.0 - 52.0 %    MCV 86.5 80.0 - 100.0 fL    MCH 26.9 26.0 - 34.0 pg    MCHC 31.1 30.0 - 36.0 g/dL    RDW 15.1 11.5 - 15.5 %    Platelets 585 (H) 150 - 400 K/uL    nRBC 0.0 0.0 - 0.2 %    Neutrophils Relative % 81 %    Neutro Abs 7.9 (H) 1.7 - 7.7 K/uL    Lymphocytes Relative 8 %    Lymphs Abs 0.8 0.7 - 4.0 K/uL    Monocytes Relative 6 %    Monocytes Absolute 0.6 0.1 - 1.0 K/uL    Eosinophils Relative 3 %    Eosinophils Absolute 0.3 0.0 - 0.5 K/uL    Basophils Relative 1 %    Basophils Absolute 0.1 0.0 - 0.1 K/uL    Immature Granulocytes 1 %    Abs Immature Granulocytes 0.06 0.00 - 0.07 K/uL      Comment: Performed at [facility], [address], [place], [place] [address]  Comprehensive metabolic panel     Status: Abnormal    Collection Time: 08/19/21  3:23 AM  Result Value Ref Range    Sodium 135 135 - 145 mmol/L    Potassium 3.9 3.5 - 5.1 mmol/L    Chloride 99 98 - 111 mmol/L    CO2 27 22 - 32 mmol/L    Glucose, Bld 132 (H) 70 - 99 mg/dL      Comment: Glucose reference range applies only to samples taken after fasting for at least 8 hours.    BUN 17 8 - 23 mg/dL    Creatinine, Ser 0.97 0.61 - 1.24 mg/dL    Calcium 8.6 (L) 8.9 - 10.3 mg/dL    Total Protein 5.5 (L) 6.5 - 8.1 g/dL    Albumin 2.3 (L) 3.5 - 5.0 g/dL    AST 19 15 - 41 U/L    ALT  29 0 - 44 U/L    Alkaline Phosphatase 539 (H) 38 - 126 U/L    Total Bilirubin 0.3 0.3 - 1.2 mg/dL    GFR, Estimated >60 >60 mL/min      Comment: (NOTE) Calculated using the CKD-EPI Creatinine Equation (2021)      Anion gap 9 5 - 15      Comment: Performed at [facility], [place] [address], [place], [place] [address]  Magnesium     Status: None    Collection Time:  08/19/21  3:23 AM  Result Value Ref Range    Magnesium 1.9 1.7 - 2.4 mg/dL      Comment: Performed at [facility], [place] [address], [place], [place] [address]  Phosphorus     Status: None    Collection Time: 08/19/21  3:23 AM  Result Value Ref Range    Phosphorus 3.9 2.5 - 4.6 mg/dL      Comment: Performed at [place] [address], [place], [place] [address]  Glucose, capillary     Status: Abnormal    Collection Time: 08/19/21  5:08 AM  Result Value Ref Range    Glucose-Capillary 145 (H) 70 - 99 mg/dL      Comment: Glucose reference range applies only to samples taken after fasting for at least 8 hours.  Glucose, capillary     Status: Abnormal    Collection Time: 08/19/21  7:38 AM  Result Value Ref Range    Glucose-Capillary 147 (H) 70 - 99 mg/dL      Comment: Glucose reference range applies only to samples taken after fasting for at least 8 hours.      Imaging Results (Last 48 hours)  No results found.         Blood pressure (!) 141/69, pulse 80, temperature 98.1 F (36.7 C), temperature source Oral, resp. rate 18, height '5\' 6"'  (1.676 m), weight 56.8 kg, SpO2 97 %.   Medical Problem List and Plan: 1. Functional deficits secondary to Debility s/p Whipple procedure             -patient may shower cover J tube             -ELOS/Goals: 7-10 days 2.  Antithrombotics: -DVT/anticoagulation:  Pharmaceutical: Lovenox             -antiplatelet therapy: N/a 3. Pain Management:  oxycodone prn.  4. Mood: LCSW to follow for evaluation and support.              -antipsychotic agents: N/A 5.  Neuropsych: This patient is capable of making decisions on his own behalf. 6. Skin/Wound Care: Monitor wound for healing.  7. Fluids/Electrolytes/Nutrition: Monitor I/O. Check CMET in am 8.Whipple for IPMN (Dr. Hyman Hopes): On tube feeds via J tube.   -Clear liquids for comfort  -Ok to hook g port to suction if he is distended, nauseated or hiccups 9. Candidal esophagitis: On Fluconazole X 10 days-->>ends 06/09  10. T2DM: Hgb A1C-             --continue Semglee 6 units with novolog prn every 4 hours  -Monitor trend of CBGs 11. Amyloidosis: chronic CN VI palsy 12. ESBL Kleb/Citrobacter: Asymptomatic bacteruria being monitored.  13.  Leucocytosis: Monitor for fevers and other signs of infection.  14. Prostate cancer s/p XRT: On hormone replacement the 15. Iron deficiency/ABLA: On iron supplement. HGB 8.0 Recheck CBC in am 14. Tachycardia: On low dose BB.  Noted to have frequent PVCs. He had prolonged QT that resolved with electrolyte repletion. 15. Hiccups: Continue gabapentin 100 mg TID. Previously on thorazine 16. Multiple myeloma. Ixazomib was noted on d/c meds from [name], 96m Dexamethazone weekly, held during recent admission 17. Parotid glad lesion. Will need ENT f/u 18. Orthostatic and Essential Tremor. BB may be providing benefit.    PBary Leriche PA-C 08/19/2021   I have personally performed a face to face diagnostic evaluation of this patient and formulated the key components of the plan.  Additionally, I have  personally reviewed laboratory data, imaging studies, as well as relevant notes and concur with the physician assistant's documentation above.  The patient's status has not changed from the original H&P.  Any changes in documentation from the acute care chart have been noted above.  Jennye Boroughs, MD, [facility]

## 2021-08-19 NOTE — Progress Notes (Signed)
Inpatient Rehabilitation Admission Medication Review by a Pharmacist  A complete drug regimen review was completed for this patient to identify any potential clinically significant medication issues.  High Risk Drug Classes Is patient taking? Indication by Medication  Antipsychotic Yes Prochlorperazine: PRN nausea/vomiting  Anticoagulant Yes Enoxaparin: VTE ppx  Antibiotic Yes Fluconazole: fungal tx  Opioid Yes Oxycodone: PRN pain  Antiplatelet No   Hypoglycemics/insulin Yes Insulin aspart/glargine: diabetes  Vasoactive Medication Yes Metoprolol: hypertension  Chemotherapy No   Other Yes Bisacodyl, fleet enema, miralax: PRN constipation Gabapentin: neuropathy/hiccups Melatonin: PRN sleep Pantoprazole: GERD ppx Testosterone: hormone replacement Trazodone: PRN sleep     Type of Medication Issue Identified Description of Issue Recommendation(s)  Drug Interaction(s) (clinically significant)     Duplicate Therapy     Allergy     No Medication Administration End Date     Incorrect Dose     Additional Drug Therapy Needed     Significant med changes from prior encounter (inform family/care partners about these prior to discharge). Ixazomib/decadron for MM prescribed by [name] on hold during IP admission Resume if appropriate  Other       Clinically significant medication issues were identified that warrant physician communication and completion of prescribed/recommended actions by midnight of the next day:  No   Pharmacist comments: n/a   Time spent performing this drug regimen review (minutes): 20   Thank you for allowing pharmacy to be a part of this patient's care.  Ardyth Harps, PharmD Clinical Pharmacist

## 2021-08-20 DIAGNOSIS — R5381 Other malaise: Secondary | ICD-10-CM | POA: Diagnosis not present

## 2021-08-20 LAB — CBC WITH DIFFERENTIAL/PLATELET
Abs Immature Granulocytes: 0.06 10*3/uL (ref 0.00–0.07)
Basophils Absolute: 0.1 10*3/uL (ref 0.0–0.1)
Basophils Relative: 1 %
Eosinophils Absolute: 0.2 10*3/uL (ref 0.0–0.5)
Eosinophils Relative: 2 %
HCT: 27.2 % — ABNORMAL LOW (ref 39.0–52.0)
Hemoglobin: 8.6 g/dL — ABNORMAL LOW (ref 13.0–17.0)
Immature Granulocytes: 1 %
Lymphocytes Relative: 8 %
Lymphs Abs: 0.7 10*3/uL (ref 0.7–4.0)
MCH: 27.2 pg (ref 26.0–34.0)
MCHC: 31.6 g/dL (ref 30.0–36.0)
MCV: 86.1 fL (ref 80.0–100.0)
Monocytes Absolute: 0.6 10*3/uL (ref 0.1–1.0)
Monocytes Relative: 6 %
Neutro Abs: 8 10*3/uL — ABNORMAL HIGH (ref 1.7–7.7)
Neutrophils Relative %: 82 %
Platelets: 615 10*3/uL — ABNORMAL HIGH (ref 150–400)
RBC: 3.16 MIL/uL — ABNORMAL LOW (ref 4.22–5.81)
RDW: 15.4 % (ref 11.5–15.5)
WBC: 9.6 10*3/uL (ref 4.0–10.5)
nRBC: 0 % (ref 0.0–0.2)

## 2021-08-20 LAB — GLUCOSE, CAPILLARY
Glucose-Capillary: 113 mg/dL — ABNORMAL HIGH (ref 70–99)
Glucose-Capillary: 131 mg/dL — ABNORMAL HIGH (ref 70–99)
Glucose-Capillary: 134 mg/dL — ABNORMAL HIGH (ref 70–99)
Glucose-Capillary: 137 mg/dL — ABNORMAL HIGH (ref 70–99)
Glucose-Capillary: 149 mg/dL — ABNORMAL HIGH (ref 70–99)
Glucose-Capillary: 166 mg/dL — ABNORMAL HIGH (ref 70–99)

## 2021-08-20 LAB — COMPREHENSIVE METABOLIC PANEL
ALT: 31 U/L (ref 0–44)
AST: 21 U/L (ref 15–41)
Albumin: 2.3 g/dL — ABNORMAL LOW (ref 3.5–5.0)
Alkaline Phosphatase: 482 U/L — ABNORMAL HIGH (ref 38–126)
Anion gap: 9 (ref 5–15)
BUN: 20 mg/dL (ref 8–23)
CO2: 27 mmol/L (ref 22–32)
Calcium: 8.6 mg/dL — ABNORMAL LOW (ref 8.9–10.3)
Chloride: 99 mmol/L (ref 98–111)
Creatinine, Ser: 0.83 mg/dL (ref 0.61–1.24)
GFR, Estimated: >60 mL/min (ref >60)
Glucose, Bld: 138 mg/dL — ABNORMAL HIGH (ref 70–99)
Potassium: 3.9 mmol/L (ref 3.5–5.1)
Sodium: 135 mmol/L (ref 135–145)
Total Bilirubin: 0.5 mg/dL (ref 0.3–1.2)
Total Protein: 5.5 g/dL — ABNORMAL LOW (ref 6.5–8.1)

## 2021-08-20 NOTE — Progress Notes (Signed)
PROGRESS NOTE   Subjective/Complaints:  No new c/os, has a lot of salivation , suction ace wrapped to Left hand , no abd pain , no breathing issues, uses urinal   ROS- no N/V/ loose but not liquid stools  Objective:   No results found. Recent Labs    08/19/21 0323 08/20/21 0551  WBC 9.7 9.6  HGB 8.0* 8.6*  HCT 25.7* 27.2*  PLT 585* 615*   Recent Labs    08/19/21 0323 08/20/21 0551  NA 135 135  K 3.9 3.9  CL 99 99  CO2 27 27  GLUCOSE 132* 138*  BUN 17 20  CREATININE 0.97 0.83  CALCIUM 8.6* 8.6*    Intake/Output Summary (Last 24 hours) at 08/20/2021 0825 Last data filed at 08/20/2021 6503 Gross per 24 hour  Intake 0 ml  Output 1425 ml  Net -1425 ml        Physical Exam: Vital Signs Blood pressure 118/62, pulse 81, temperature 97.9 F (36.6 C), temperature source Oral, resp. rate 15, height _0  (1.651 m), weight 58.5 kg, SpO2 95 %.   General: No acute distress Mood and affect are appropriate Heart: Regular rate and rhythm no rubs murmurs or extra sounds Lungs: Clear to auscultation, breathing unlabored, no rales or wheezes Abdomen: Positive bowel sounds, soft nontender to palpation, nondistended, GJ site intact  Extremities: No clubbing, cyanosis, or edema Skin: No evidence of breakdown, no evidence of rash Neurologic: Cranial nerves II through XII intact, motor strength is 4+/5 in bilateral deltoid, bicep, tricep, grip, hip flexor, knee extensors, ankle dorsiflexor and plantar flexor RIght CN 6 Musculoskeletal: Full range of motion in all 4 extremities. No joint swelling   Assessment/Plan: 1. Functional deficits which require 3+ hours per day of interdisciplinary therapy in a comprehensive inpatient rehab setting. Physiatrist is providing close team supervision and 24 hour management of active medical problems listed below. Physiatrist and rehab team continue to assess barriers to discharge/monitor  patient progress toward functional and medical goals  Care Tool:  Bathing              Bathing assist       Upper Body Dressing/Undressing Upper body dressing        Upper body assist      Lower Body Dressing/Undressing Lower body dressing            Lower body assist       Toileting Toileting    Toileting assist       Transfers Chair/bed transfer  Transfers assist           Locomotion Ambulation   Ambulation assist              Walk 10 feet activity   Assist           Walk 50 feet activity   Assist           Walk 150 feet activity   Assist           Walk 10 feet on uneven surface  activity   Assist           Wheelchair     Assist  Wheelchair 50 feet with 2 turns activity    Assist            Wheelchair 150 feet activity     Assist          Blood pressure 118/62, pulse 81, temperature 97.9 F (36.6 C), temperature source Oral, resp. rate 15, height _0  (1.651 m), weight 58.5 kg, SpO2 95 %.  Medical Problem List and Plan: 1. Functional deficits secondary to Debility s/p Whipple procedure 07/13/21             -patient may shower cover J tube             -ELOS/Goals: 7-10 days 2.  Antithrombotics: -DVT/anticoagulation:  Pharmaceutical: Lovenox             -antiplatelet therapy: N/a 3. Pain Management:  oxycodone prn.  4. Mood: LCSW to follow for evaluation and support.              -antipsychotic agents: N/A 5. Neuropsych: This patient is capable of making decisions on his own behalf. 6. Skin/Wound Care: Monitor wound for healing.  7. Fluids/Electrolytes/Nutrition: Monitor I/O. Check CMET in am 8.Whipple for IPMN (Dr. Hyman Hopes): On tube feeds via J tube.             -Clear liquids for comfort            -Ok to hook g port to suction if he is distended, nauseated or hiccups 9. Candidal esophagitis: On Fluconazole X 10 days-->>ends 06/09  10. T2DM: Hgb A1C-              --continue Semglee 6 units with novolog prn every 4 hours          CBG (last 3)  Recent Labs    08/19/21 2014 08/20/21 0003 08/20/21 0403  GLUCAP 108* 149* 134*    11. Amyloidosis: chronic right CN VI palsy 12. ESBL Kleb/Citrobacter: Asymptomatic bacteruria being monitored.  13.  Leucocytosis: Monitor for fevers and other signs of infection.  14. Prostate cancer s/p XRT: On hormone replacement the 15. Iron deficiency/ABLA: On iron supplement. HGB 8.0 Recheck CBC in am 14. Tachycardia: On low dose BB.  Noted to have frequent PVCs. He had prolonged QT that resolved with electrolyte repletion. Vitals:   08/19/21 2003 08/20/21 0407  BP: (!) 119/59 118/62  Pulse: 87 81  Resp: 18 15  Temp: 98.4 F (36.9 C) 97.9 F (36.6 C)  SpO2: 99% 95%    15. Hiccups: Continue gabapentin 100 mg TID. Previously on thorazine 16. Multiple myeloma. Ixazomib was noted on d/c meds from [name], 91m Dexamethazone weekly, held during recent admission 17. RIght Parotid gland lesion increasing in size since 2019 . Will need ENT f/u 18. Orthostatic and Essential Tremor. BB may be providing benefit.    LOS: 1 days A FACE TO FACE EVALUATION WAS PERFORMED  ACharlett Blake6/10/2021, 8:25 AM

## 2021-08-20 NOTE — Evaluation (Signed)
Physical Therapy Assessment and Plan  Patient Details  Name: William Barron MRN: [id number] Date of Birth: 1945/07/19  PT Diagnosis: Abnormality of gait, Coordination disorder, Difficulty walking, and Muscle weakness Rehab Potential: Good ELOS: ~10 days   Today's Date: 08/20/2021 PT Individual Time: 1050-1202 PT Individual Time Calculation (min): 72 min    Hospital Problem: Principal Problem:   Debility   Past Medical History:  Past Medical History:  Diagnosis Date   Amyloidosis ([place])    Diabetes mellitus without complication ([place])    GERD (gastroesophageal reflux disease)    Hearing loss    Has hearing aids   History of kidney stones    Hyperlipidemia    Multiple myeloma ([place])    and amylodosis    Nephrolithiasis    Occasional tremors    Pneumonia    Prostate cancer ([place])    Prostatitis    acute and chronic   Skin cancer    Past Surgical History:  Past Surgical History:  Procedure Laterality Date   APPENDECTOMY     CARDIAC CATHETERIZATION N/A 06/13/2015   Procedure: Left Heart Cath and Coronary Angiography;  Surgeon: Jettie Booze, MD;  Location: [facility];  Service: Cardiovascular;  Laterality: N/A;   COLONOSCOPY  04/07/2000   ELBOW SURGERY  03/15/2001   right   EXTRACORPOREAL SHOCK WAVE LITHOTRIPSY Left 07/17/2018   Procedure: EXTRACORPOREAL SHOCK WAVE LITHOTRIPSY (ESWL);  Surgeon: [name], MD;  Location: [facility];  Service: Urology;  Laterality: Left;   EXTRACORPOREAL SHOCK WAVE LITHOTRIPSY Right 12/08/2020   Procedure: RIGHT EXTRACORPOREAL SHOCK WAVE LITHOTRIPSY (ESWL);  Surgeon: Raynelle Bring, MD;  Location: [facility];  Service: Urology;  Laterality: Right;   FOOT ARTHRODESIS Right 04/15/2021   Procedure: RIGHT SUBTALAR AND TALONAVICULAR FUSION;  Surgeon: Newt Minion, MD;  Location: [place];  Service: Orthopedics;  Laterality: Right;   HERNIA REPAIR     ROTATOR CUFF REPAIR     WHIPPLE PROCEDURE  07/13/2021     Assessment & Plan Clinical Impression: Patient is a 76 y.o. year old male with history of amyloidosis, prostate CA s/p XRT, T2DM, CAD, pancreatic mass (IPMN) s/p Whipple with J/G tube at [facility] complicated by delirium, UTI, candidal esophagitis and need for continuous suction via G tube due to intractable hiccups?.  He was discharged to [facility] who was unable to provide care needed (due to G tube used for continuous suction) and sent back to ED. [facility] had no beds so he was admitted to [facility]12/[facility] for management and reports of increase in abdominal pain, hiccups and cough.  Thorazine changed to gabapentin and G port hooked to suction prn distension/hiccups and started on clears for comfort.  He is tolerating continuous tube feeds via J tube. MRI brain done due to reports of facial droop/confusion on 06/03 and was negative for acute changes. Son reports his facial expression appears unchanged from prior. PT/OT working with patient and he continues to be limited by fatigue, ataxic gait and SOB due to anxiety/tendency to hold his breath. CIR recommended due to functional decline. He reports history of chronic essential tremor and orthostatic tremor. He has difficulty standing but less trouble with movement and ambulation due to this tremor. Patient transferred to CIR on 08/19/2021 .   Patient currently requires min/mod assist with mobility secondary to muscle weakness, decreased cardiorespiratoy endurance, unbalanced muscle activation and decreased coordination, and decreased standing balance, decreased postural control, and decreased balance strategies.  Prior to hospitalization, patient was  independent  with mobility and lived with Spouse in a House home.  Home access is 3Stairs to enter.  Patient will benefit from skilled PT intervention to maximize safe functional mobility, minimize fall risk, and decrease caregiver burden for planned discharge home with intermittent supervision.  Anticipate patient  will benefit from follow up [place] at discharge.  PT - End of Session Activity Tolerance: Tolerates 30+ min activity with multiple rests Endurance Deficit: Yes Endurance Deficit Description: requires frequent seated rest breaks due to deconditioning and orthostatic vitals PT Assessment Rehab Potential (ACUTE/IP ONLY): Good PT Barriers to Discharge: Inaccessible home environment;Decreased caregiver support;Home environment access/layout;Nutrition means;Lack of/limited family support PT Patient demonstrates impairments in the following area(s): Balance;Safety;Skin Integrity;Endurance;Edema;Motor;Nutrition;Pain PT Transfers Functional Problem(s): Bed Mobility;Bed to Chair;Car;Furniture;Floor PT Locomotion Functional Problem(s): Ambulation;Stairs;Wheelchair Mobility PT Plan PT Intensity: Minimum of 1-2 x/day ,45 to 90 minutes PT Frequency: 5 out of 7 days PT Duration Estimated Length of Stay: ~10 days PT Treatment/Interventions: Ambulation/gait training;Community reintegration;DME/adaptive equipment instruction;Neuromuscular re-education;Psychosocial support;Stair training;UE/LE Strength taining/ROM;Wheelchair propulsion/positioning;Balance/vestibular training;Discharge planning;Functional electrical stimulation;Pain management;Skin care/wound management;Therapeutic Activities;UE/LE Coordination activities;Cognitive remediation/compensation;Disease management/prevention;Functional mobility training;Patient/family education;Splinting/orthotics;Therapeutic Exercise;Visual/perceptual remediation/compensation PT Transfers Anticipated Outcome(s): mod-I using LRAD PT Locomotion Anticipated Outcome(s): supervision using LRAD PT Recommendation Recommendations for Other Services: Therapeutic Recreation consult Therapeutic Recreation Interventions: Outing/community reintergration Follow Up Recommendations: Home health PT;Other (comment) (intermittent supervision) Patient destination: Home Equipment  Recommended: To be determined Equipment Details: has RW   PT Evaluation Precautions/Restrictions Precautions Precautions: Fall;Other (comment) Precaution Comments: GJ tube, orthostatic tremors, orthostatic vitals, clear liquids only Restrictions Weight Bearing Restrictions: No Pain Pain Assessment Pain Scale: 0-10 Pain Score: 0-No pain Pain Interference Pain Interference Pain Effect on Sleep: 1. Rarely or not at all Pain Interference with Therapy Activities: 3. Frequently Pain Interference with Day-to-Day Activities: 1. Rarely or not at all Home Living/Prior [place] Available Help at Discharge: Family;Available 24 hours/day (wife can provide supervision only) Type of Home: House Home Access: Stairs to enter [employer] of Steps: 3-4 Entrance Stairs-Rails: Right Home Layout: Two level;1/2 bath on main level Alternate Level Stairs-Number of Steps: has chair lift that covers all but 3-4 of the steps to 2nd floor Alternate Level Stairs-Rails: Right Bathroom Shower/Tub: Walk-in shower Additional Comments: Has been using a RW at [employer] for transfers  Lives With: Spouse (wife, [name]) Prior Function Level of Independence: Other (comment) (prior to foot surgery 2 months ago he was completely independent without AD - and since his surgery on May 1st he has been in the hospital only mobilizing primarily bed<>w/c using RW with assistance)  Able to Take Stairs?: Yes (had foot surgery approximately 2 months ago and had to start using chair lift for a portion of the stairs at that point) Driving: Yes (prior foot surgery 2 months ago) Vision/Perception  Vision - History Ability to See in Adequate Light: 0 Adequate Patient Visual Report: No change from baseline; pt with impaired R eye alignment at baseline and frequently closes R eye when trying to focus his vision Perception Perception: Within Functional Limits Praxis Praxis: Intact  Cognition Overall  Cognitive Status: Within Functional Limits for tasks assessed Arousal/Alertness: Awake/alert Orientation Level: Oriented X4 Year: 2023 Month: June Day of Week: Correct Attention: Focused;Sustained;Selective Focused Attention: Appears intact Sustained Attention: Appears intact Selective Attention: Appears intact Safety/Judgment: Appears intact Sensation  Sensation Light Touch: Appears Intact Hot/Cold: Not tested Proprioception: Appears Intact Stereognosis: Not tested Coordination Gross Motor Movements are Fluid and Coordinated: No Coordination and Movement Description: significant deconditioning from prolonged hospital course  with associated generalized weakness Motor  Motor Motor: Other (comment) Motor - Skilled Clinical Observations: deconditioning and generalized weakness   Trunk/Postural Assessment  Cervical Assessment Cervical Assessment: Exceptions to [facility] (mild forward head) Thoracic Assessment Thoracic Assessment: Exceptions to [facility] (rounded shoulders) Lumbar Assessment Lumbar Assessment: Exceptions to [facility] (flexible posterior pelvic tilt) Postural Control Postural Control: Deficits on evaluation Protective Responses: delayed and impacted by tremors Postural Limitations: decreased relying on B UE support on RW  Balance Balance Balance Assessed: Yes Static Sitting Balance Static Sitting - Balance Support: Feet supported Static Sitting - Level of Assistance: 5: Stand by assistance Dynamic Sitting Balance Dynamic Sitting - Balance Support: Feet supported Dynamic Sitting - Level of Assistance: Other (comment) (CGA) Static Standing Balance Static Standing - Balance Support: During functional activity;Bilateral upper extremity supported Static Standing - Level of Assistance: 4: Min assist;3: Mod assist (tremors worsen with prolonged standing) Dynamic Standing Balance Dynamic Standing - Balance Support: Bilateral upper extremity supported;During functional  activity Dynamic Standing - Level of Assistance: 4: Min assist;3: Mod assist Extremity Assessment      RLE Assessment RLE Assessment: Exceptions to [facility] Active Range of Motion (AROM) Comments: WFL General Strength Comments: assessed in sitting RLE Strength Right Hip Flexion: 4/5 Right Knee Flexion: 4/5 Right Knee Extension: 4/5 Right Ankle Dorsiflexion: 4/5 Right Ankle Plantar Flexion: 4/5 LLE Assessment LLE Assessment: Exceptions to [facility] Active Range of Motion (AROM) Comments: WFL General Strength Comments: assessed in sitting LLE Strength Left Hip Flexion: 4+/5 Left Knee Flexion: 4+/5 Left Knee Extension: 4+/5 Left Ankle Dorsiflexion: 4+/5 Left Ankle Plantar Flexion: 4+/5  Care Tool Care Tool Bed Mobility Roll left and right activity   Roll left and right assist level: Supervision/Verbal cueing    Sit to lying activity   Sit to lying assist level: Contact Guard/Touching assist    Lying to sitting on side of bed activity   Lying to sitting on side of bed assist level: the ability to move from lying on the back to sitting on the side of the bed with no back support.: Contact Guard/Touching assist     Care Tool Transfers Sit to stand transfer   Sit to stand assist level: Minimal Assistance - Patient > 75% Sit to stand assistive device: Armrests;Walker  Chair/bed transfer   Chair/bed transfer assist level: Minimal Assistance - Patient > 75% Chair/bed transfer assistive device: Armrests;Walker   Physiological scientist transfer assist level: Moderate Assistance - Patient 50 - 74% Health visitor Comment: RW    Care Tool Locomotion Ambulation   Assist level: Moderate Assistance - Patient 50 - 74% Assistive device: Walker-rolling Max distance: 58f  Walk 10 feet activity   Assist level: Minimal Assistance - Patient > 75% Assistive device: Walker-rolling   Walk 50 feet with 2 turns activity Walk 50 feet with 2 turns activity did not  occur: Safety/medical concerns      Walk 150 feet activity Walk 150 feet activity did not occur: Safety/medical concerns      Walk 10 feet on uneven surfaces activity Walk 10 feet on uneven surfaces activity did not occur: Safety/medical concerns      Stairs   Assist level: Minimal Assistance - Patient > 75% Stairs assistive device: 2 hand rails Max number of stairs: 4  Walk up/down 1 step activity   Walk up/down 1 step (curb) assist level: Minimal Assistance - Patient > 75% Walk up/down 1 step or curb assistive  device: 2 hand rails  Walk up/down 4 steps activity   Walk up/down 4 steps assist level: Minimal Assistance - Patient > 75% Walk up/down 4 steps assistive device: 2 hand rails  Walk up/down 12 steps activity Walk up/down 12 steps activity did not occur: Safety/medical concerns      Pick up small objects from floor   Pick up small object from the floor assist level: Total Assistance - Patient < 25% Pick up small object from the floor assistive device: using RW support  Wheelchair Is the patient using a wheelchair?: Yes (only for transportation purposes) Type of Wheelchair: Manual   Wheelchair assist level: Dependent - Patient 0%    Wheel 50 feet with 2 turns activity   Assist Level: Dependent - Patient 0%  Wheel 150 feet activity   Assist Level: Dependent - Patient 0%    Refer to Care Plan for Long Term Goals  SHORT TERM GOAL WEEK 1 PT Short Term Goal 1 (Week 1): Pt will perform supine<>sit with supervision consistently PT Short Term Goal 2 (Week 1): Pt will perform sit<>stands using LRAD with CGA consistently PT Short Term Goal 3 (Week 1): Pt will perform bed<>chair transfers using LRAD with CGA consistently PT Short Term Goal 4 (Week 1): Pt will ambulate at least 163f using LRAD with CGA PT Short Term Goal 5 (Week 1): Pt will navigate at least 4 steps using R HR only to simulate home environment with CGA  Recommendations for other services: Therapeutic  Recreation  Outing/community reintegration  Skilled Therapeutic Intervention Pt received sitting in wheelchair using oral suction. Evaluation completed (see details above) with patient education regarding purpose of PT evaluation, PT POC and goals, therapy schedule, weekly team meetings, and other CIR information including safety plan and fall risk safety. Pt performed the below functional mobility tasks with the specified levels of skilled cuing and assistance. Pt has 524yrx of orthostatic tremors; therefore, when he stands he has B UE and B LE tremors that worsen during static standing.  Assessed vitals during session: Sitting at beginning: BP 113/52 (MAP 69), HR 71bpm  Standing: BP 77/58 (MAP 65), HR 80bpm, pt symptomatic Sitting: BP 109/59 (MAP 75), HR 75bpm  After 4 stair navigation: BP 101/53 (MAP 68), HR 82bpm, SpO2 100%  Nurse made aware of orthostatic hypotension and will plan to reassess in afternoon session as well. At end of session, pt left seated in w/c with needs in reach, seat belt alarm on, and chair pad alarm in place.  Mobility Bed Mobility Bed Mobility: Sit to Supine;Supine to Sit Supine to Sit: Contact Guard/Touching assist Sit to Supine: Contact Guard/Touching assist Transfers Transfers: Sit to Stand;Stand to Sit;Stand Pivot Transfers Sit to Stand: Minimal Assistance - Patient > 75% Stand to Sit: Minimal Assistance - Patient > 75% Stand Pivot Transfers: Minimal Assistance - Patient > 75% Stand Pivot Transfer Details: Tactile cues for sequencing;Verbal cues for technique;Verbal cues for sequencing;Verbal cues for gait pattern;Verbal cues for precautions/safety;Verbal cues for safe use of DME/AE;Tactile cues for weight shifting;Manual facilitation for weight shifting;Manual facilitation for placement;Other (comment) (requires therapist to stabilize RW due to tremors) Transfer (Assistive device): Rolling walker Locomotion  Gait Ambulation: Yes Gait Assistance: Minimal  Assistance - Patient > 75%;Moderate Assistance - Patient 50-74% Gait Distance (Feet): 47 Feet Assistive device: Rolling walker Gait Assistance Details: Manual facilitation for weight shifting;Verbal cues for safe use of DME/AE;Manual facilitation for placement;Tactile cues for weight shifting;Tactile cues for posture;Visual cues for safe use of DME/AE;Verbal cues  for sequencing;Verbal cues for gait pattern;Verbal cues for precautions/safety Gait Gait: Yes Gait Pattern: Impaired Gait Pattern: Narrow base of support;Step-to pattern;Decreased step length - left;Decreased step length - right;Trunk flexed;Decreased weight shift to left (B UE and B LE tremors) Gait velocity: decreased Stairs / Additional Locomotion Stairs: Yes Stairs Assistance: Minimal Assistance - Patient > 75%;Moderate Assistance - Patient 50 - 74% Stair Management Technique: Two rails;Forwards;Step to pattern;Alternating pattern (alternating on ascent and step-to during descent) Number of Stairs: 4 Height of Stairs: 6 Wheelchair Mobility Wheelchair Mobility: No   Discharge Criteria: Patient will be discharged from PT if patient refuses treatment 3 consecutive times without medical reason, if treatment goals not met, if there is a change in medical status, if patient makes no progress towards goals or if patient is discharged from hospital.  The above assessment, treatment plan, treatment alternatives and goals were discussed and mutually agreed upon: by patient  Tawana Scale , PT, DPT, NCS, CSRS 08/20/2021, 7:59 AM

## 2021-08-20 NOTE — Progress Notes (Signed)
Inpatient Rehabilitation Care Coordinator Assessment and Plan Patient Details  Name: William Barron MRN: [id number] Date of Birth: [birth year]  Today's Date: 08/20/2021  Hospital Problems: Principal Problem:   Debility  Past Medical History:  Past Medical History:  Diagnosis Date   Amyloidosis ([place])    Diabetes mellitus without complication ([place])    GERD (gastroesophageal reflux disease)    Hearing loss    Has hearing aids   History of kidney stones    Hyperlipidemia    Multiple myeloma ([place])    and amylodosis    Nephrolithiasis    Occasional tremors    Pneumonia    Prostate cancer ([place])    Prostatitis    acute and chronic   Skin cancer    Past Surgical History:  Past Surgical History:  Procedure Laterality Date   APPENDECTOMY     CARDIAC CATHETERIZATION N/A 06/13/2015   Procedure: Left Heart Cath and Coronary Angiography;  Surgeon: Jettie Booze, MD;  Location: [facility];  Service: Cardiovascular;  Laterality: N/A;   COLONOSCOPY  04/07/2000   ELBOW SURGERY  03/15/2001   right   EXTRACORPOREAL SHOCK WAVE LITHOTRIPSY Left 07/17/2018   Procedure: EXTRACORPOREAL SHOCK WAVE LITHOTRIPSY (ESWL);  Surgeon: [name], MD;  Location: [facility];  Service: Urology;  Laterality: Left;   EXTRACORPOREAL SHOCK WAVE LITHOTRIPSY Right 12/08/2020   Procedure: RIGHT EXTRACORPOREAL SHOCK WAVE LITHOTRIPSY (ESWL);  Surgeon: Raynelle Bring, MD;  Location: [facility];  Service: Urology;  Laterality: Right;   FOOT ARTHRODESIS Right 04/15/2021   Procedure: RIGHT SUBTALAR AND TALONAVICULAR FUSION;  Surgeon: Newt Minion, MD;  Location: [facility];  Service: Orthopedics;  Laterality: Right;   HERNIA REPAIR     ROTATOR CUFF REPAIR     WHIPPLE PROCEDURE  07/13/2021   Social History:  reports that he quit smoking about 44 years ago. His smoking use included cigarettes. He has a 60.00 pack-year smoking history. He has never used smokeless tobacco. He reports current  alcohol use of about 3.0 standard drinks of alcohol per week. He reports that he does not use drugs.  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 Children: William Barron Anticipated Caregiver: Spouse Ability/Limitations of Caregiver: spouse reports being unable to provide 24/7 supervision to patient at discharge Caregiver Availability: Intermittent Family Dynamics: support from spouse  Social History Preferred language: English Religion: [place] - How often do you need to have someone help you when you read instructions, pamphlets, or other written material from your doctor or pharmacy?: Never Writes: Yes   Abuse/Neglect Abuse/Neglect Assessment Can Be Completed: Yes Physical Abuse: Denies Verbal Abuse: Denies Sexual Abuse: Denies Exploitation of patient/patient's resources: Denies Self-Neglect: Denies  Patient response to: Social Isolation - How often do you feel lonely or isolated from those around you?: Rarely  Emotional Status Recent Psychosocial Issues: coping Psychiatric History: n/a Substance Abuse History: n/a  Patient / Family Perceptions, Expectations & Goals Pt/Family understanding of illness & functional limitations: yes Premorbid pt/family roles/activities: Previously independent prior to foot surgery Anticipated changes in roles/activities/participation: Patient will require some assistance in the home Pt/family expectations/goals: Supervision  [place] Agencies: None Premorbid Home Care/DME Agencies: Other (Comment) Paramedic) Transportation available at discharge: Family able to transport Is the patient able to respond to transportation needs?: Yes In the past 12 months, has lack of transportation kept you from medical appointments or from getting medications?: No In the past 12 months, has lack of transportation kept you from meetings,  work, or from getting things needed for daily living?:  No  Discharge Planning Living Arrangements: Spouse/significant other Support Systems: Spouse/significant other Type of Residence: Private residence Insurance Resources: Multimedia programmer (specify) Financial Resources: Family Support Financial Screen Referred: No Money Management: Patient, Spouse Does the patient have any problems obtaining your medications?: No Home Management: Independent Patient/Family Preliminary Plans: Spouse able to Care Coordinator Barriers to Discharge: Lack of/limited family support, Decreased caregiver support Care Coordinator Anticipated Follow Up Needs: HH/OP Expected length of stay: 7-10 Days  Clinical Impression Sw spoke with patient spouse introduced self and explained role. Sw provided patient spouse with the ELOS and spouse believes this is too short of a stay for patient and he will require 203 weeks. SW informed patient spouse that this is determined by progress, his physician and therapy team. Spouse reports patient potentially will be unable to return home due to being able to provide 24/7 supervision. Sw informed spouse that it was reports that she was able to provide 24/7 supervision and she planned on hiring assistance, spouse reports she is unable to do so. Spouse potentially looking at SNF placement after discharge depending on patient progress. Sw informed spouse patient current goals are set at supervision. No additional questions or concerns, sw will continue to follow up.   Dyanne Iha 08/20/2021, 1:11 PM

## 2021-08-20 NOTE — Progress Notes (Signed)
Inpatient Rehabilitation  Patient information reviewed and entered into eRehab system by Carlyn Lemke Mathew Postiglione, OTR/L.   Information including medical coding, functional ability and quality indicators will be reviewed and updated through discharge.    

## 2021-08-20 NOTE — Progress Notes (Signed)
Physical Therapy Session Note  Patient Details  Name: William Barron MRN: [id number] Date of Birth: [birth year]  Today's Date: 08/20/2021 PT Individual Time: 6962-9528 PT Individual Time Calculation (min): 74 min   Short Term Goals: Week 1:  PT Short Term Goal 1 (Week 1): Pt will perform supine<>sit with supervision consistently PT Short Term Goal 2 (Week 1): Pt will perform sit<>stands using LRAD with CGA consistently PT Short Term Goal 3 (Week 1): Pt will perform bed<>chair transfers using LRAD with CGA consistently PT Short Term Goal 4 (Week 1): Pt will ambulate at least 138f using LRAD with CGA PT Short Term Goal 5 (Week 1): Pt will navigate at least 4 steps using R HR only to simulate home environment with CGA  Skilled Therapeutic Interventions/Progress Updates:    Pt received sitting in w/c and agreeable to therapy session. Nurse present to disconnect tube feedings. Transported to/from gym in w/c for time management and energy conservation.  Vitals assessed during session: Sitting in w/c at beginning of session:  BP 120/58 (MAP 78), HR 76bpm  Standing: BP 83/54 (MAP 65), HR 136bpm *pt symptomatic Seated: BP 113/52 (MAP 70), HR 80bpm   Donned thigh high TED hose.   Returned to standing and reassessed: BP 113/53 (MAP 68), HR 93bpm ; however, pt reporting symptoms were still just as intense as above.  *Pt unable to tolerate standing long enough each time to allow BP machine to get a reading before needing to sit.   Pam, PA and nurse made aware of pt's orthostatic vitals.   Gait training 370fusing RW light min assist for steadying/balance and stabilizing RW due to tremors - very narrow BOS with feet brushing against eachother during swing, decreased B LE step lengths (L more impaired than R), and very slow gait speed.  Gait training 3517f3 with +2 B HHA and min assist for balance to promote upright posture, increased gait speed, and decrease effects of UB tremors on gait -  continues to have narrow BOS despite cuing to improve. Also performed ~8ft21f L lateral side stepping to promote increased hip abductor activation with +2 B HHA and +2 min assist for balance.  Pt requires prolonged seated rest breaks between gait bouts due to severe deconditioning.  Vitals assessed during gait training: - After 2nd B HHA walk: BP 108/63 (MAP 77), HR 84bpm with pt reporting feeling "dizzy" - after seated rest break: BP 117/59 (MAP 75), HR 74bpm   Pt reports too fatigued to continue standing and gait training tasks. Squat pivot w/c<>EOM with CGA.  Seated EOM, B UE exercises of overhead press with 4lb dowel rod and scapular retractions against level 3 (green) theraband resistance - therapist cuing for proper form/technique.  Transported back to room and left seated in w/c with needs in reach, lines intact, and chair alarm on.    Therapy Documentation Precautions:  Precautions Precautions: Fall, Other (comment) Precaution Comments: GJ tube, orthostatic tremors, orthostatic vitals, clear liquids only Restrictions Weight Bearing Restrictions: No   Pain: Denies pain during session.    Therapy/Group: Individual Therapy  CarlTawana ScaleT, DPT, NCS, CSRS 08/20/2021, 1:18 PM

## 2021-08-20 NOTE — Progress Notes (Signed)
Patient ID: William Barron, male   DOB: [birth year], 76 y.o.   MRN: [id number] Met with the patient to review rehab process, team conference and plan of care. Discussed nutritional means,G-J tube maintenance and medication administration. Patient distracted by missing hearing aide, nursing notified. Also has suction set up connected to hand; encouraged spiting out secretions as opposed to orally suctioning. Also reviewed  secondary risk management including DM (A1C 7.8) on insulin, HTN, HLD and CAD. Patient noted his wife helped him manage medications and will administer his insulin. Reviewed family education prior to discharge; patient concerned wife will be overwhelmed by care. Continue to follow along to discharge to address educational needs to facilitate preparation for discharge home with wife and hired caregivers. Margarito Liner

## 2021-08-20 NOTE — Plan of Care (Signed)
  Problem: RH Balance Goal: LTG Patient will maintain dynamic sitting balance (PT) Description: LTG:  Patient will maintain dynamic sitting balance with assistance during mobility activities (PT) Flowsheets (Taken 08/20/2021 1854) LTG: Pt will maintain dynamic sitting balance during mobility activities with:: Independent with assistive device  Goal: LTG Patient will maintain dynamic standing balance (PT) Description: LTG:  Patient will maintain dynamic standing balance with assistance during mobility activities (PT) Flowsheets (Taken 08/20/2021 1854) LTG: Pt will maintain dynamic standing balance during mobility activities with:: Supervision/Verbal cueing   Problem: Sit to Stand Goal: LTG:  Patient will perform sit to stand with assistance level (PT) Description: LTG:  Patient will perform sit to stand with assistance level (PT) Flowsheets (Taken 08/20/2021 1854) LTG: PT will perform sit to stand in preparation for functional mobility with assistance level: Independent with assistive device   Problem: RH Bed Mobility Goal: LTG Patient will perform bed mobility with assist (PT) Description: LTG: Patient will perform bed mobility with assistance, with/without cues (PT). Flowsheets (Taken 08/20/2021 1854) LTG: Pt will perform bed mobility with assistance level of: Independent with assistive device    Problem: RH Bed to Chair Transfers Goal: LTG Patient will perform bed/chair transfers w/assist (PT) Description: LTG: Patient will perform bed to chair transfers with assistance (PT). Flowsheets (Taken 08/20/2021 1854) LTG: Pt will perform Bed to Chair Transfers with assistance level: Independent with assistive device    Problem: RH Car Transfers Goal: LTG Patient will perform car transfers with assist (PT) Description: LTG: Patient will perform car transfers with assistance (PT). Flowsheets (Taken 08/20/2021 1854) LTG: Pt will perform car transfers with assist:: Supervision/Verbal cueing   Problem:  RH Ambulation Goal: LTG Patient will ambulate in controlled environment (PT) Description: LTG: Patient will ambulate in a controlled environment, # of feet with assistance (PT). Flowsheets (Taken 08/20/2021 1854) LTG: Pt will ambulate in controlled environ  assist needed:: Supervision/Verbal cueing LTG: Ambulation distance in controlled environment: 160f using LRAD Goal: LTG Patient will ambulate in home environment (PT) Description: LTG: Patient will ambulate in home environment, # of feet with assistance (PT). Flowsheets (Taken 08/20/2021 1854) LTG: Pt will ambulate in home environ  assist needed:: Supervision/Verbal cueing LTG: Ambulation distance in home environment: 515fusing LRAD   Problem: RH Stairs Goal: LTG Patient will ambulate up and down stairs w/assist (PT) Description: LTG: Patient will ambulate up and down # of stairs with assistance (PT) Flowsheets (Taken 08/20/2021 1854) LTG: Pt will ambulate up/down stairs assist needed:: Supervision/Verbal cueing LTG: Pt will  ambulate up and down number of stairs: 4 steps using R HR per home set-up

## 2021-08-20 NOTE — Progress Notes (Signed)
[facility] Individual Statement of Services  Patient Name:  William Barron  Date:  08/20/2021  Welcome to the [facility].  Our goal is to provide you with an individualized program based on your diagnosis and situation, designed to meet your specific needs.  With this comprehensive rehabilitation program, you will be expected to participate in at least 3 hours of rehabilitation therapies Monday-Friday, with modified therapy programming on the weekends.  Your rehabilitation program will include the following services:  Physical Therapy (PT), Occupational Therapy (OT), Speech Therapy (ST), 24 hour per day rehabilitation nursing, Therapeutic Recreaction (TR), Neuropsychology, Care Coordinator, Rehabilitation Medicine, Nutrition Services, Pharmacy Services, and Other  Weekly team conferences will be held on Wednesdays to discuss your progress.  Your Inpatient Rehabilitation Care Coordinator will talk with you frequently to get your input and to update you on team discussions.  Team conferences with you and your family in attendance may also be held.  Expected length of stay: 7-10 Days  Overall anticipated outcome:  Supervision  Depending on your progress and recovery, your program may change. Your Inpatient Rehabilitation Care Coordinator will coordinate services and will keep you informed of any changes. Your Inpatient Rehabilitation Care Coordinator's name and contact numbers are listed  below.  The following services may also be recommended but are not provided by the [facility]:   [place] will be made to provide these services after discharge if needed.  Arrangements include referral to agencies that provide these services.  Your insurance has been verified to be:  Medicare A & B Your primary doctor is:  Andree Moro, MD  Pertinent information  will be shared with your doctor and your insurance company.  Inpatient Rehabilitation Care Coordinator:  Erlene Quan, [place] or [phone]  Information discussed with and copy given to patient by: Dyanne Iha, 08/20/2021, 12:21 PM

## 2021-08-20 NOTE — Evaluation (Signed)
Occupational Therapy Assessment and Plan  Patient Details  Name: William Barron MRN: [id number] Date of Birth: 26-Dec-1945  OT Diagnosis: cognitive deficits and muscle weakness (generalized) Rehab Potential: Rehab Potential (ACUTE ONLY): Good ELOS: ~10 days   Today's Date: 08/20/2021 OT Individual Time: 0930-1030 OT Individual Time Calculation (min): 60 min     Hospital Problem: Principal Problem:   Debility   Past Medical History:  Past Medical History:  Diagnosis Date   Amyloidosis ([place])    Diabetes mellitus without complication ([place])    GERD (gastroesophageal reflux disease)    Hearing loss    Has hearing aids   History of kidney stones    Hyperlipidemia    Multiple myeloma ([place])    and amylodosis    Nephrolithiasis    Occasional tremors    Pneumonia    Prostate cancer ([place])    Prostatitis    acute and chronic   Skin cancer    Past Surgical History:  Past Surgical History:  Procedure Laterality Date   APPENDECTOMY     CARDIAC CATHETERIZATION N/A 06/13/2015   Procedure: Left Heart Cath and Coronary Angiography;  Surgeon: Jettie Booze, MD;  Location: [facility];  Service: Cardiovascular;  Laterality: N/A;   COLONOSCOPY  04/07/2000   ELBOW SURGERY  03/15/2001   right   EXTRACORPOREAL SHOCK WAVE LITHOTRIPSY Left 07/17/2018   Procedure: EXTRACORPOREAL SHOCK WAVE LITHOTRIPSY (ESWL);  Surgeon: [name], MD;  Location: [facility];  Service: Urology;  Laterality: Left;   EXTRACORPOREAL SHOCK WAVE LITHOTRIPSY Right 12/08/2020   Procedure: RIGHT EXTRACORPOREAL SHOCK WAVE LITHOTRIPSY (ESWL);  Surgeon: Raynelle Bring, MD;  Location: [facility];  Service: Urology;  Laterality: Right;   FOOT ARTHRODESIS Right 04/15/2021   Procedure: RIGHT SUBTALAR AND TALONAVICULAR FUSION;  Surgeon: Newt Minion, MD;  Location: [facility];  Service: Orthopedics;  Laterality: Right;   HERNIA REPAIR     ROTATOR CUFF REPAIR     WHIPPLE PROCEDURE  07/13/2021     Assessment & Plan Clinical Impression: Patient is a 76 y.o. year old male with history of amyloidosis, prostate CA s/p XRT, T2DM, CAD, pancreatic mass (IPMN) s/p Whipple with J/G tube at [facility] complicated by delirium, UTI, candidal esophagitis and need for continuous suction via G tube due to intractable hiccups?.  He was discharged to [facility] who was unable to provide care needed (due to G tube used for continuous suction) and sent back to ED. [facility] had no beds so he was admitted to [facility] 08/12/21 for management and reports of increase in abdominal pain, hiccups and cough.  Thorazine changed to gabapentin and G port hooked to suction prn distension/hiccups and started on clears for comfort.  He is tolerating continuous tube feeds via J tube. MRI brain done due to reports of facial droop/confusion on 06/03 and was negative for acute changes. Son reports his facial expression appears unchanged from prior. PT/OT working with patient and he continues to be limited by fatigue, ataxic gait and SOB due to anxiety/tendency to hold his breath. CIR recommended due to functional decline. He reports history of chronic essential tremor and orthostatic tremor. He has difficulty standing but less trouble with movement and ambulation due to this tremor. Patient transferred to CIR on 08/19/2021 .    Patient currently requires min with basic self-care skills and functional mobility   secondary to muscle weakness, decreased cardiorespiratoy endurance, decreased memory and delayed processing, and decreased standing balance, decreased balance strategies, and difficulty maintaining  precautions.  Prior to hospitalization, patient could complete ADL with independent .  Patient will benefit from skilled intervention to decrease level of assist with basic self-care skills and increase independence with basic self-care skills prior to discharge home with care partner.  Anticipate patient will require intermittent supervision and  follow up home health.  OT - End of Session Activity Tolerance: Tolerates 10 - 20 min activity with multiple rests Endurance Deficit: Yes OT Assessment Rehab Potential (ACUTE ONLY): Good OT Patient demonstrates impairments in the following area(s): Balance;Cognition;Edema;Endurance;Motor;Safety;Skin Integrity OT Basic ADL's Functional Problem(s): Grooming;Bathing;Dressing;Toileting OT Transfers Functional Problem(s): Toilet OT Additional Impairment(s): None OT Plan OT Intensity: Minimum of 1-2 x/day, 45 to 90 minutes OT Frequency: 5 out of 7 days OT Duration/Estimated Length of Stay: ~10 days OT Treatment/Interventions: Balance/vestibular training;Neuromuscular re-education;Self Care/advanced ADL retraining;Therapeutic Exercise;Cognitive remediation/compensation;DME/adaptive equipment instruction;Pain management;Skin care/wound managment;UE/LE Strength taining/ROM;Community reintegration;Patient/family education;UE/LE Coordination activities;Discharge planning;Functional mobility training;Psychosocial support;Therapeutic Activities OT Self Feeding Anticipated Outcome(s): n/a OT Basic Self-Care Anticipated Outcome(s): supervision to mod I OT Toileting Anticipated Outcome(s): mod I OT Bathroom Transfers Anticipated Outcome(s): mod I OT Recommendation Recommendations for Other Services: Neuropsych consult Patient destination: Home Follow Up Recommendations: Home health OT   OT Evaluation Precautions/Restrictions  Precautions Precautions: Fall;Other (comment) Precaution Comments: GJ tube, orthostatic tremors, orthostatic vitals, clear liquids only Restrictions Weight Bearing Restrictions: No General   Vital Signs   Pain Pain Assessment Pain Scale: 0-10 Pain Score: 0-No pain Home Living/Prior Functioning Home Living Living Arrangements: Spouse/significant other Available Help at Discharge: Family, Available 24 hours/day Type of Home: House Home Access: Stairs to  enter Technical brewer of Steps: 3 Entrance Stairs-Rails: Right Home Layout: Two level, 1/2 bath on main level Alternate Level Stairs-Number of Steps: has chair lift Alternate Level Stairs-Rails: Left Bathroom Shower/Tub: Walk-in shower Additional Comments: Has been using a RW at [employer] for transfers  Lives With: Spouse Prior Function Level of Independence: Other (comment) (prior to foot surgery 2 months ago he was completely independent without AD - and since his surgery on May 1st he has been in the hospital only mobilizing primarily bed<>w/c using RW with assistance)  Able to Take Stairs?: Yes (had foot surgery approximately 2 months ago and had to start using chair lift for a portion of the stairs at that point) Driving: Yes (prior foot surgery 2 months ago) Vision Baseline Vision/History: 1 Wears glasses Ability to See in Adequate Light: 0 Adequate Patient Visual Report: No change from baseline Perception  Perception: Within Functional Limits Praxis Praxis: Intact Cognition Cognition Overall Cognitive Status: Within Functional Limits for tasks assessed Arousal/Alertness: Awake/alert Orientation Level: Person;Place;Situation Person: Oriented Situation: Oriented Attention: Selective;Sustained Focused Attention: Appears intact Sustained Attention: Appears intact Selective Attention: Appears intact Safety/Judgment: Appears intact Brief Interview for Mental Status (BIMS) Repetition of Three Words (First Attempt): 3 Temporal Orientation: Year: Correct Temporal Orientation: Month: Accurate within 5 days Temporal Orientation: Day: Correct Recall: "Sock": No, could not recall Recall: "Blue": Yes, no cue required Recall: "Bed": Yes, no cue required BIMS Summary Score: 13 Sensation Sensation Light Touch: Appears Intact Hot/Cold: Appears Intact Proprioception: Appears Intact Stereognosis: Not tested Coordination Gross Motor Movements are Fluid and Coordinated: No Fine  Motor Movements are Fluid and Coordinated: No Coordination and Movement Description: significant deconditioning Motor  Motor Motor: Other (comment) Motor - Skilled Clinical Observations: deconditioned  Trunk/Postural Assessment  Cervical Assessment Cervical Assessment: Exceptions to [facility] (forward head) Thoracic Assessment Thoracic Assessment:  (rounded shoulders) Lumbar Assessment Lumbar Assessment:  (posterior pelvic tilt) Postural Control Postural  Control: Deficits on evaluation Righting Reactions: delayed Protective Responses: delayed and impacted by tremors Postural Limitations: decreased relying on B UE support on RW  Balance Balance Balance Assessed: Yes Static Sitting Balance Static Sitting - Balance Support: Bilateral upper extremity supported Static Sitting - Level of Assistance: 5: Stand by assistance Dynamic Sitting Balance Dynamic Sitting - Balance Support: During functional activity Dynamic Sitting - Level of Assistance: 5: Stand by assistance Static Standing Balance Static Standing - Balance Support: During functional activity Static Standing - Level of Assistance: 4: Min assist Dynamic Standing Balance Dynamic Standing - Balance Support: During functional activity Dynamic Standing - Level of Assistance: 4: Min assist Extremity/Trunk Assessment RUE Assessment RUE Assessment: Within Functional Limits LUE Assessment LUE Assessment: Within Functional Limits  Care Tool Care Tool Self Care Eating Eating activity did not occur: Safety/medical concerns      Oral Care    Oral Care Assist Level: Set up assist    Bathing   Body parts bathed by patient: Right arm;Left arm;Abdomen;Front perineal area;Chest;Right upper leg;Left upper leg;Right lower leg;Left lower leg;Face Body parts bathed by helper: Buttocks   Assist Level: Minimal Assistance - Patient > 75%    Upper Body Dressing(including orthotics)   What is the patient wearing?: Pull over shirt   Assist  Level: Supervision/Verbal cueing    Lower Body Dressing (excluding footwear)   What is the patient wearing?: Underwear/pull up;Pants Assist for lower body dressing: Minimal Assistance - Patient > 75%    Putting on/Taking off footwear   What is the patient wearing?: Socks;Shoes Assist for footwear: Supervision/Verbal cueing       Care Tool Toileting Toileting activity   Assist for toileting: Maximal Assistance - Patient 25 - 49%     Care Tool Bed Mobility Roll left and right activity        Sit to lying activity        Lying to sitting on side of bed activity         Care Tool Transfers Sit to stand transfer   Sit to stand assist level: Minimal Assistance - Patient > 75%    Chair/bed transfer   Chair/bed transfer assist level: Minimal Assistance - Patient > 75%     Toilet transfer   Assist Level: Minimal Assistance - Patient > 75%     Care Tool Cognition  Expression of Ideas and Wants Expression of Ideas and Wants: 4. Without difficulty (complex and basic) - expresses complex messages without difficulty and with speech that is clear and easy to understand  Understanding Verbal and Non-Verbal Content Understanding Verbal and Non-Verbal Content: 3. Usually understands - understands most conversations, but misses some part/intent of message. Requires cues at times to understand   Memory/Recall Ability     Refer to Care Plan for Long Term Goals  SHORT TERM GOAL WEEK 1 OT Short Term Goal 1 (Week 1): Pt will perform toileting with supervision OT Short Term Goal 2 (Week 1): Pt will be able to stand without UE support - for clothing mangement OT Short Term Goal 3 (Week 1): LTG=STG  Recommendations for other services: None    Skilled Therapeutic Intervention OT eval initiated OT purpose, role, and goals reviewed with pt. Pt received in the bed and pt able to get up from a flat bed with supervision with extra time. PT able to stand pivot to the w/c with min A. Self care  retraining at the sink. Pt able to bathe sit to stand with min A .  Pt's tremors are worse in standing with and without UE support. Pt able to don clothing including tennis shoes and ambulate to the bathroom with RW with min A. Max A in standing for toileting. Pt left sitting up in the w/c with safety measures in place.   ADL ADL Eating: Set up Grooming: Setup Where Assessed-Grooming: Sitting at sink Where Assessed-Upper Body Bathing: Sitting at sink Lower Body Bathing: Minimal assistance Where Assessed-Lower Body Bathing: Sitting at sink Upper Body Dressing: Setup Where Assessed-Upper Body Dressing: Sitting at sink Lower Body Dressing: Minimal assistance Where Assessed-Lower Body Dressing: Sitting at sink Toileting: Maximal assistance Where Assessed-Toileting: Glass blower/designer: Psychiatric nurse Method: Counselling psychologist: Energy manager: Not assessed Mobility  Bed Mobility Bed Mobility: Supine to Sit;Sitting - Scoot to Edge of Bed Supine to Sit: Supervision/Verbal cueing Sitting - Scoot to Edge of Bed: Supervision/Verbal cueing Sit to Supine: Supervision/Verbal cueing Transfers Sit to Stand: Minimal Assistance - Patient > 75% Stand to Sit: Minimal Assistance - Patient > 75%   Discharge Criteria: Patient will be discharged from OT if patient refuses treatment 3 consecutive times without medical reason, if treatment goals not met, if there is a change in medical status, if patient makes no progress towards goals or if patient is discharged from hospital.  The above assessment, treatment plan, treatment alternatives and goals were discussed and mutually agreed upon: by patient  Nicoletta Ba 08/20/2021, 2:13 PM

## 2021-08-21 DIAGNOSIS — R5381 Other malaise: Secondary | ICD-10-CM | POA: Diagnosis not present

## 2021-08-21 LAB — GLUCOSE, CAPILLARY
Glucose-Capillary: 104 mg/dL — ABNORMAL HIGH (ref 70–99)
Glucose-Capillary: 109 mg/dL — ABNORMAL HIGH (ref 70–99)
Glucose-Capillary: 132 mg/dL — ABNORMAL HIGH (ref 70–99)
Glucose-Capillary: 136 mg/dL — ABNORMAL HIGH (ref 70–99)
Glucose-Capillary: 139 mg/dL — ABNORMAL HIGH (ref 70–99)

## 2021-08-21 MED ORDER — FREE WATER: 185.0000 mL | Status: DC

## 2021-08-21 MED ORDER — GLUCERNA 1.5 CAL PO LIQD
1000.0000 mL | ORAL | Status: DC
  Administered 2021-08-22 – 2021-08-23 (×3): 1000 mL
  Filled 2021-08-21 (×12): qty 1000

## 2021-08-21 MED ORDER — FREE WATER
100.0000 mL | Status: DC
  Administered 2021-08-21 – 2021-08-23 (×11): 100 mL

## 2021-08-21 NOTE — Progress Notes (Signed)
Physical Therapy Session Note  Patient Details  Name: William Barron MRN: [id number] Date of Birth: 26-Feb-1946  Today's Date: 08/21/2021 PT Individual Time: 1000-1056 PT Individual Time Calculation (min): 56 min   Short Term Goals: Week 1:  PT Short Term Goal 1 (Week 1): Pt will perform supine<>sit with supervision consistently PT Short Term Goal 2 (Week 1): Pt will perform sit<>stands using LRAD with CGA consistently PT Short Term Goal 3 (Week 1): Pt will perform bed<>chair transfers using LRAD with CGA consistently PT Short Term Goal 4 (Week 1): Pt will ambulate at least 166f using LRAD with CGA PT Short Term Goal 5 (Week 1): Pt will navigate at least 4 steps using R HR only to simulate home environment with CGA  Skilled Therapeutic Interventions/Progress Updates:     Pt sitting in w/c with LPN providing Rx through feeding tube. Pt agreeable to PT tx and he denies any pain. Very pleasant throughout session. With thigh-high TED's on during treatment session. Transported via w/c to main rehab gym for time and energy conservation. Active warm up 1x5 sit<>stands to RW with CGA. Instructed in gait training 382f+ 3516fith CGA/minA and RW - gait speed decreased with narrow BOS - distance limited by fatigue and lightheadedness - resolves with seated rest. Stair training up/down x4 steps with 2 hand rails and CGA - fatigue during descent with increased lightheadedness, requesting seated rest. Stairs completed forward facing, step-to pattern. Continued stair training 2x7 repeated step ups to 6inch step with CGA and 2 hand rails. Standing there-ex in // bars with CGA with 2.5# ankle weights bilaterally: -1x12 high knees -1x10 hip abduction (bilaterally) -1x10 heel raises  Pt returned to his room and requesting to lie down due to fatigue. Stand<>pivot transfer using bed rail and CGA/minA. Bed mobility completed supervision. All needs met, bed alarm on.   Therapy Documentation Precautions:   Precautions Precautions: Fall, Other (comment) Precaution Comments: GJ tube, orthostatic tremors, orthostatic vitals, clear liquids only Restrictions Weight Bearing Restrictions: No General:    Therapy/Group: Individual Therapy  ChrAlger Simons9/2023, 7:40 AM

## 2021-08-21 NOTE — IPOC Note (Signed)
Overall Plan of Care [facility]) Patient Details Name: William Barron MRN: [id number] DOB: 11-Dec-1945  Admitting Diagnosis: Debility  Hospital Problems: Principal Problem:   Debility     Functional Problem List: Nursing Bladder, Bowel, Safety, Endurance, Medication Management, Pain  PT Balance, Safety, Skin Integrity, Endurance, Edema, Motor, Nutrition, Pain  OT Balance, Cognition, Edema, Endurance, Motor, Safety, Skin Integrity  SLP    TR         Basic ADL's: OT Grooming, Bathing, Dressing, Toileting     Advanced  ADL's: OT       Transfers: PT Bed Mobility, Bed to Chair, Car, [employer], Floor  OT Toilet     Locomotion: PT Ambulation, Stairs, Wheelchair Mobility     Additional Impairments: OT None  SLP        TR      Anticipated Outcomes Item Anticipated Outcome  Self Feeding n/a  Swallowing      Basic self-care  supervision to mod I  Toileting  mod I   Bathroom Transfers mod I  Bowel/Bladder  manage bowel w mod I and bladder w toileting  Transfers  mod-I using LRAD  Locomotion  supervision using LRAD  Communication     Cognition     Pain  pain at or below level 4 with prns  Safety/Judgment  maintain safety w cues   Therapy Plan: PT Intensity: Minimum of 1-2 x/day ,45 to 90 minutes PT Frequency: 5 out of 7 days PT Duration Estimated Length of Stay: ~10 days OT Intensity: Minimum of 1-2 x/day, 45 to 90 minutes OT Frequency: 5 out of 7 days OT Duration/Estimated Length of Stay: ~10 days     Team Interventions: Nursing Interventions Patient/Family Education, Pain Management, Medication Management, Bladder Management, Bowel Management, Disease Management/Prevention  PT interventions Ambulation/gait training, Community reintegration, DME/adaptive equipment instruction, Neuromuscular re-education, Psychosocial support, Stair training, UE/LE Strength taining/ROM, Wheelchair propulsion/positioning, Training and development officer, Discharge planning,  Functional electrical stimulation, Pain management, Skin care/wound management, Therapeutic Activities, UE/LE Coordination activities, Cognitive remediation/compensation, Disease management/prevention, Functional mobility training, Patient/family education, Splinting/orthotics, Therapeutic Exercise, Visual/perceptual remediation/compensation  OT Interventions Balance/vestibular training, Neuromuscular re-education, Self Care/advanced ADL retraining, Therapeutic Exercise, Cognitive remediation/compensation, DME/adaptive equipment instruction, Pain management, Skin care/wound managment, UE/LE Strength taining/ROM, Community reintegration, Barrister's clerk education, UE/LE Coordination activities, Discharge planning, Functional mobility training, Psychosocial support, Therapeutic Activities  SLP Interventions    TR Interventions    SW/CM Interventions Discharge Planning, Psychosocial Support, Patient/Family Education, Disease Management/Prevention   Barriers to Discharge MD  Medical stability and Nutritional means  Nursing Decreased caregiver support, Home environment access/layout 2 level w chair lift 1/2 bath on main, 3 ste bil rails with wife and hired help  PT Inaccessible home environment, Decreased caregiver support, Home environment Child psychotherapist, Nutrition means, Lack of/limited family support    OT      SLP      SW Lack of/limited family support, Decreased caregiver support     Team Discharge Planning: Destination: PT-Home ,OT- Home , SLP-  Projected Follow-up: PT-Home health PT, Other (comment) (intermittent supervision), OT-  Home health OT, SLP-  Projected Equipment Needs: PT-To be determined, OT-  , SLP-  Equipment Details: PT-has RW, OT-  Patient/family involved in discharge planning: PT- Patient,  OT-Patient, SLP-   MD ELOS: 10d Medical Rehab Prognosis:  Fair Assessment: The patient has been admitted for CIR therapies with the diagnosis of debility due to pancreatic cancer . The  team will be addressing functional mobility, strength, stamina, balance, safety, adaptive techniques and equipment, self-care,  bowel and bladder mgt, patient and caregiver education, GJ tube feeding . Goals have been set at mod I /Sup. Anticipated discharge destination is home with hired assist.        See Team Conference Notes for weekly updates to the plan of care

## 2021-08-21 NOTE — Progress Notes (Signed)
Occupational Therapy Session Note  Patient Details  Name: William Barron MRN: [id number] Date of Birth: February 29, 1946  Today's Date: 08/21/2021 OT Individual Time: 7062-3762 session 1 OT Individual Time Calculation (min): 60 min  Session 2: 8315-1761    Short Term Goals: Week 1:  OT Short Term Goal 1 (Week 1): Pt will perform toileting with supervision OT Short Term Goal 2 (Week 1): Pt will be able to stand without UE support - for clothing mangement OT Short Term Goal 3 (Week 1): LTG=STG  Skilled Therapeutic Interventions/Progress Updates:  Session 1: Pt greeted supine in bed  agreeable to OT intervention. Session focus on BADL reeducation, functional mobility, dynamic standing balance and decreasing overall caregiver burden.            Pt completed supine>sit with elevated HOB with CGA. BP assessed from EOB with thigh high TEDS:  BP 109/63 ( 77) HR 92 reports dizziness   BP standing 113/82( 92) HR 149 - unsure if BP accurately as pt with tremors in standing and unable to stand for duration of vitals assessment.  Pt completed stand pivot to w/c with Rw and MINA. Pt completed w/c mobility to sink for bathing tasks with supervision.   Pt completed bathing at sink with overall MIN A needing assist for balance to wash LB. Pt donned [place] shirt with set- up assist. MIN A to don pants with MIN A to for balance when pulling pants up to waist line in standing.  Pts wife did call during session upset about LOS being 7-10 days, education provided that is the estimated LOS and DC date would be determined based on pts progress with therapy and medical clearance however encouraged wife to be proactive in getting home set- up for pt as soon as possible in case pt does make remarkable progress. pt left seated in w/c with alarm activated and all needs within reach.                    Session 2: Pt received asleep in supine but easily able to arouse and   agreeable to OT intervention.  Pt completed supine>sit  with CGA, MIN A for stand pivot transfer to w/c. Pt transported to apt with total A where for time mgmt where pt completed stand pivot transfer to flat HOB and couch with overall MIN A with Rw. Set- up simulated walkin shower transfer with pt able to step into shower backwards with Rw. Of note, pts shower is upstairs, pt does have a stair lift but still has to be able to go up 3 steps. Explained to pt that it is possible that he could go home with the drain and may not be able to shower in which case getting up to the stairs would not be needed as pt could sleep on the first floor.      Pt does report dizziness after mobility in apt BP indicated below from sitting: 117/63 ( 78) HR 79  Remainder of session focus on therapeutic activities with an emphasis on dynamic balance and LB strength and endurance. Pt completed below activities  - x10 standing squats with BUE support and CGA - x10 sit>stands holding 2.5 lb weighted ball with CGA  -x10 OH presses in standing with 2.5 lb weighted ball with overall CGA  Pt transported back to room with total A. Pt continues to be concerned about getting released too soon, reiterated that LOS will be assessed every day. Pt does seem to be  concerned about chronic issues that unfortunately cannot be cured during LOS, education provided on inpt rehab scope of practice and option to learn to manage/compensate for more chronic issues in an outpatient or HH level of care once pt has met rehab goals of care.  pt left up in w/c with alarm activated and all needs within reach.          Therapy Documentation Precautions:  Precautions Precautions: Fall, Other (comment) Precaution Comments: GJ tube, orthostatic tremors, orthostatic vitals, clear liquids only Restrictions Weight Bearing Restrictions: No  Pain: Session 1: no pain  Session 2: no pain reported.    Therapy/Group: Individual Therapy  Corinne Ports [facility] 08/21/2021, 12:05 PM

## 2021-08-21 NOTE — Progress Notes (Signed)
PROGRESS NOTE   Subjective/Complaints:  No new c/os, has a lot of salivation , suction ace wrapped to Left hand , no abd pain , no breathing issues, uses urinal   Wife needing to arrange post d/c in home help, hoping for 2 wk LOS   ROS- no N/V/ loose but not liquid stools  Objective:   No results found. Recent Labs    08/19/21 0323 08/20/21 0551  WBC 9.7 9.6  HGB 8.0* 8.6*  HCT 25.7* 27.2*  PLT 585* 615*    Recent Labs    08/19/21 0323 08/20/21 0551  NA 135 135  K 3.9 3.9  CL 99 99  CO2 27 27  GLUCOSE 132* 138*  BUN 17 20  CREATININE 0.97 0.83  CALCIUM 8.6* 8.6*     Intake/Output Summary (Last 24 hours) at 08/21/2021 0820 Last data filed at 08/21/2021 0551 Gross per 24 hour  Intake 360 ml  Output 725 ml  Net -365 ml         Physical Exam: Vital Signs Blood pressure (!) 100/53, pulse 73, temperature 98.1 F (36.7 C), resp. rate 18, height '5\' 5"'  (1.651 m), weight 57.3 kg, SpO2 97 %.   General: No acute distress Mood and affect are appropriate Heart: Regular rate and rhythm no rubs murmurs or extra sounds Lungs: Clear to auscultation, breathing unlabored, no rales or wheezes Abdomen: Positive bowel sounds, soft nontender to palpation, nondistended, GJ site intact  Extremities: No clubbing, cyanosis, or edema Skin: No evidence of breakdown, no evidence of rash Neurologic: Cranial nerves II through XII intact, motor strength is 4+/5 in bilateral deltoid, bicep, tricep, grip, hip flexor, knee extensors, ankle dorsiflexor and plantar flexor RIght CN 6 Cerebellar no ataxia dose have essential tremor BUE  Musculoskeletal: Full range of motion in all 4 extremities. No joint swelling   Assessment/Plan: 1. Functional deficits which require 3+ hours per day of interdisciplinary therapy in a comprehensive inpatient rehab setting. Physiatrist is providing close team supervision and 24 hour management of  active medical problems listed below. Physiatrist and rehab team continue to assess barriers to discharge/monitor patient progress toward functional and medical goals  Care Tool:  Bathing    Body parts bathed by patient: Right arm, Left arm, Abdomen, Front perineal area, Chest, Right upper leg, Left upper leg, Right lower leg, Left lower leg, Face   Body parts bathed by helper: Buttocks     Bathing assist Assist Level: Minimal Assistance - Patient > 75%     Upper Body Dressing/Undressing Upper body dressing   What is the patient wearing?: Pull over shirt    Upper body assist Assist Level: Supervision/Verbal cueing    Lower Body Dressing/Undressing Lower body dressing      What is the patient wearing?: Underwear/pull up, Pants     Lower body assist Assist for lower body dressing: Minimal Assistance - Patient > 75%     Toileting Toileting    Toileting assist Assist for toileting: Maximal Assistance - Patient 25 - 49%     Transfers Chair/bed transfer  Transfers assist     Chair/bed transfer assist level: Minimal Assistance - Patient > 75% Chair/bed transfer assistive device:  Armrests, Museum/gallery exhibitions officer assist      Assist level: Moderate Assistance - Patient 50 - 74% Assistive device: Walker-rolling Max distance: 74f   Walk 10 feet activity   Assist     Assist level: Minimal Assistance - Patient > 75% Assistive device: Walker-rolling   Walk 50 feet activity   Assist Walk 50 feet with 2 turns activity did not occur: Safety/medical concerns         Walk 150 feet activity   Assist Walk 150 feet activity did not occur: Safety/medical concerns         Walk 10 feet on uneven surface  activity   Assist Walk 10 feet on uneven surfaces activity did not occur: Safety/medical concerns         Wheelchair     Assist Is the patient using a wheelchair?: Yes (only for transportation purposes) Type of  Wheelchair: Manual    Wheelchair assist level: Dependent - Patient 0%      Wheelchair 50 feet with 2 turns activity    Assist        Assist Level: Dependent - Patient 0%   Wheelchair 150 feet activity     Assist      Assist Level: Dependent - Patient 0%   Blood pressure (!) 100/53, pulse 73, temperature 98.1 F (36.7 C), resp. rate 18, height '5\' 5"'  (1.651 m), weight 57.3 kg, SpO2 97 %.  Medical Problem List and Plan: 1. Functional deficits secondary to Debility s/p Whipple procedure 07/13/21             -patient may shower cover J tube             -ELOS/Goals: 7-10 days- will get therapy input as evals continue  2.  Antithrombotics: -DVT/anticoagulation:  Pharmaceutical: Lovenox             -antiplatelet therapy: N/a 3. Pain Management:  oxycodone prn.  4. Mood: LCSW to follow for evaluation and support.              -antipsychotic agents: N/A 5. Neuropsych: This patient is capable of making decisions on his own behalf. 6. Skin/Wound Care: Monitor wound for healing.  7. Fluids/Electrolytes/Nutrition: Monitor I/O. Check CMET in am 8.Whipple for IPMN (Dr. NHyman Hopes: On tube feeds via J tube.             -Clear liquids for comfort            -Ok to hook g port to suction if he is distended, nauseated or hiccups 9. Candidal esophagitis: On Fluconazole X 10 days-->>ends 06/09  10. T2DM: Hgb A1C-             --continue Semglee 6 units with novolog prn every 4 hours          CBG (last 3)  Recent Labs    08/20/21 2004 08/20/21 2302 08/21/21 0410  GLUCAP 131* 113* 136*    Controlled 6/9 11. Amyloidosis: chronic right CN VI palsy 12. ESBL Kleb/Citrobacter: Asymptomatic bacteruria being monitored.  13.  Leucocytosis: Resolved see below  14. Prostate cancer s/p XRT: On hormone replacement therapy  15. Iron deficiency/ABLA: On iron supplement. HGB 8.0 Monitor     Latest Ref Rng & Units 08/20/2021    5:51 AM 08/19/2021    3:23 AM 08/17/2021    3:10 AM  CBC  WBC 4.0 -  10.5 K/uL 9.6  9.7  10.1   Hemoglobin 13.0 - 17.0 g/dL 8.6  8.0  8.6   Hematocrit 39.0 - 52.0 % 27.2  25.7  27.4   Platelets 150 - 400 K/uL 615  585  575      14. Tachycardia: On low dose BB.  Noted to have frequent PVCs. He had prolonged QT that resolved with electrolyte repletion. Vitals:   08/20/21 1927 08/21/21 0414  BP: 112/60 (!) 100/53  Pulse: 88 73  Resp: 18 18  Temp: 97.7 F (36.5 C) 98.1 F (36.7 C)  SpO2: 99% 97%    15. Hiccups: Continue gabapentin 100 mg TID. Previously on thorazine 16. Multiple myeloma. Ixazomib was noted on d/c meds from [name], 29m Dexamethazone weekly, held during recent admission 17. RIght Parotid gland lesion increasing in size since 2019 . Will need ENT f/u, pt states he was unaware of this finding  18. Orthostatic and Essential Tremor. BB may be providing benefit.    LOS: 2 days A FACE TO FACE EVALUATION WAS PERFORMED  ACharlett Blake6/11/2021, 8:20 AM

## 2021-08-21 NOTE — Progress Notes (Signed)
Initial Nutrition Assessment  DOCUMENTATION CODES:   Severe malnutrition in context of chronic illness  INTERVENTION:   Tube Feed via J port of G-J tube: Increase Glucerna 1.5 to 75 mL/hr (1575 mL/day) 185 mL free water flush q4h or per MD Provides 2362 kcal, 130 gm PRO, and 1195 mL free water (2305 mL total free water) daily.   NUTRITION DIAGNOSIS:   Severe Malnutrition related to chronic illness as evidenced by severe muscle depletion, severe fat depletion.  GOAL:   Patient will meet greater than or equal to 90% of their needs  MONITOR:   Labs, TF tolerance, Weight trends  REASON FOR ASSESSMENT:   New TF    ASSESSMENT:   76 y.o. male admitted to CIR for functional decline. Pt recently underwent a Whipple Procedure and placement of G-J tube 2/2 pancreatic mass on 5/1 at [facility]. Pt discharged to [facility] and was immediately sent to [facility] due to facility unable to care for pt. PMH includes prostate cancer s/p XRT, multiple myeloma, amyloidosis, HTN, T2DM, and GERD.   Pt receiving TF via J-port and G-port connected to gravity drainage.  Pt currently on CLD for comfort, pt reports that he has not been taking anything in via mouth due to TF meeting all his needs. Pt denies any intolerance to current TF regimen.  TF currently not infusing, pt in between therapy sessions. Discussed needing to increase reate of TF due to being held for therapy; pt agreeable.   Pt reports that he has a call with his [name] MD this afternoon and plans to discuss TF with him as well.   Medications reviewed and include: Ferrous Sulfate, SSI 0-9 units q4h, Semglee, Protonix Labs reviewed: 24 hr CBG 113-166   G-Tube Output: 525 mL x 24 hr  NUTRITION - FOCUSED PHYSICAL EXAM:  Flowsheet Row Most Recent Value  Orbital Region Severe depletion  Upper Arm Region Moderate depletion  Thoracic and Lumbar Region Severe depletion  Buccal Region Severe depletion  Temple Region Moderate depletion  Clavicle  Bone Region Severe depletion  Clavicle and Acromion Bone Region Severe depletion  Scapular Bone Region Severe depletion  Dorsal Hand Moderate depletion  Patellar Region Severe depletion  Anterior Thigh Region Severe depletion  Posterior Calf Region Severe depletion  Edema (RD Assessment) None  Hair Reviewed  Eyes Reviewed  Mouth Reviewed  Skin Reviewed  Nails Reviewed   Diet Order:   Diet Order             Diet clear liquid Room service appropriate? Yes; Fluid consistency: Thin  Diet effective now                  EDUCATION NEEDS:   No education needs have been identified at this time  Skin:  Skin Assessment: Reviewed RN Assessment  Last BM:  6/8  Height:   Ht Readings from Last 1 Encounters:  08/19/21 '5\' 5"'  (1.651 m)    Weight:   Wt Readings from Last 1 Encounters:  08/21/21 57.3 kg    Ideal Body Weight:  64.5 kg  BMI:  Body mass index is 21.02 kg/m.  Estimated Nutritional Needs:   Kcal:  2200-2400  Protein:  110-125 grams  Fluid:  >/= 2.2 L    Hermina Barters RD, LDN Clinical Dietitian See [facility] for contact information.

## 2021-08-22 DIAGNOSIS — R5381 Other malaise: Secondary | ICD-10-CM | POA: Diagnosis not present

## 2021-08-22 DIAGNOSIS — E119 Type 2 diabetes mellitus without complications: Secondary | ICD-10-CM | POA: Diagnosis not present

## 2021-08-22 LAB — GLUCOSE, CAPILLARY
Glucose-Capillary: 127 mg/dL — ABNORMAL HIGH (ref 70–99)
Glucose-Capillary: 141 mg/dL — ABNORMAL HIGH (ref 70–99)
Glucose-Capillary: 143 mg/dL — ABNORMAL HIGH (ref 70–99)
Glucose-Capillary: 153 mg/dL — ABNORMAL HIGH (ref 70–99)
Glucose-Capillary: 161 mg/dL — ABNORMAL HIGH (ref 70–99)
Glucose-Capillary: 82 mg/dL (ref 70–99)

## 2021-08-22 NOTE — Progress Notes (Signed)
PROGRESS NOTE   Subjective/Complaints:  No new issues. When I discussed the mgt of his g-tube today, he had a lot of questions regarding the follow up of his tube after he left here, etc. Doesn't have much of an appetite  ROS: Patient denies fever, rash, sore throat, blurred vision, dizziness,  vomiting, diarrhea, cough, shortness of breath or chest pain, joint or back/neck pain, headache, or mood change.   Objective:   No results found. Recent Labs    08/20/21 0551  WBC 9.6  HGB 8.6*  HCT 27.2*  PLT 615*   Recent Labs    08/20/21 0551  NA 135  K 3.9  CL 99  CO2 27  GLUCOSE 138*  BUN 20  CREATININE 0.83  CALCIUM 8.6*    Intake/Output Summary (Last 24 hours) at 08/22/2021 0952 Last data filed at 08/22/2021 0700 Gross per 24 hour  Intake 0 ml  Output 1200 ml  Net -1200 ml        Physical Exam: Vital Signs Blood pressure (!) 108/46, pulse 78, temperature 97.8 F (36.6 C), temperature source Oral, resp. rate 18, height '5\' 5"'  (1.651 m), weight 56.1 kg, SpO2 98 %.   Constitutional: No distress . Vital signs reviewed. HEENT: NCAT, EOMI, oral membranes moist Neck: supple Cardiovascular: RRR without murmur. No JVD    Respiratory/Chest: CTA Bilaterally without wheezes or rales. Normal effort    GI/Abdomen: BS +, J-tube with feeds. G-tube to bag with stool at foot of bed. Ext: no clubbing, cyanosis, or edema Psych: pleasant and cooperative  Skin: No evidence of breakdown, no evidence of rash. Left forearm wrapped Neurologic: Cranial nerves II through XII intact, motor strength is 4+/5 in bilateral deltoid, bicep, tricep, grip, hip flexor, knee extensors, ankle dorsiflexor and plantar flexor RIght CN 6 still present/ essential tremor BUE  Musculoskeletal: Full range of motion in all 4 extremities. No joint swelling   Assessment/Plan: 1. Functional deficits which require 3+ hours per day of interdisciplinary  therapy in a comprehensive inpatient rehab setting. Physiatrist is providing close team supervision and 24 hour management of active medical problems listed below. Physiatrist and rehab team continue to assess barriers to discharge/monitor patient progress toward functional and medical goals  Care Tool:  Bathing    Body parts bathed by patient: Right arm, Left arm, Chest, Abdomen, Front perineal area, Buttocks, Right upper leg, Left upper leg   Body parts bathed by helper: Buttocks     Bathing assist Assist Level: Minimal Assistance - Patient > 75%     Upper Body Dressing/Undressing Upper body dressing   What is the patient wearing?: Pull over shirt    Upper body assist Assist Level: Set up assist    Lower Body Dressing/Undressing Lower body dressing      What is the patient wearing?: Underwear/pull up     Lower body assist Assist for lower body dressing: Moderate Assistance - Patient 50 - 74%     Toileting Toileting    Toileting assist Assist for toileting: Moderate Assistance - Patient 50 - 74%     Transfers Chair/bed transfer  Transfers assist     Chair/bed transfer assist level: Moderate Assistance -  Patient 50 - 74% Chair/bed transfer assistive device: Armrests, Programmer, multimedia   Ambulation assist      Assist level: Moderate Assistance - Patient 50 - 74% Assistive device: Walker-rolling Max distance: 78f   Walk 10 feet activity   Assist     Assist level: Minimal Assistance - Patient > 75% Assistive device: Walker-rolling   Walk 50 feet activity   Assist Walk 50 feet with 2 turns activity did not occur: Safety/medical concerns         Walk 150 feet activity   Assist Walk 150 feet activity did not occur: Safety/medical concerns         Walk 10 feet on uneven surface  activity   Assist Walk 10 feet on uneven surfaces activity did not occur: Safety/medical concerns         Wheelchair     Assist Is  the patient using a wheelchair?: Yes (only for transportation purposes) Type of Wheelchair: Manual    Wheelchair assist level: Dependent - Patient 0%      Wheelchair 50 feet with 2 turns activity    Assist        Assist Level: Dependent - Patient 0%   Wheelchair 150 feet activity     Assist      Assist Level: Dependent - Patient 0%   Blood pressure (!) 108/46, pulse 78, temperature 97.8 F (36.6 C), temperature source Oral, resp. rate 18, height '5\' 5"'  (1.651 m), weight 56.1 kg, SpO2 98 %.  Medical Problem List and Plan: 1. Functional deficits secondary to Debility s/p Whipple procedure 07/13/21             -patient may shower cover J tube             -ELOS/Goals: 7-10 days  -Continue CIR therapies including PT, OT   2.  Antithrombotics: -DVT/anticoagulation:  Pharmaceutical: Lovenox             -antiplatelet therapy: N/a 3. Pain Management:  oxycodone prn.  4. Mood: LCSW to follow for evaluation and support.              -antipsychotic agents: N/A 5. Neuropsych: This patient is capable of making decisions on his own behalf. 6. Skin/Wound Care: Monitor wound for healing.  7. Fluids/Electrolytes/Nutrition: Monitor I/O.   8.Whipple for IPMN (Dr. NHyman Hopes: On tube feeds via J tube.             -Clear liquids for comfort although he doesn't want much po right now            -g-port to be clamped during the day with venting. Unclamped at night 9. Candidal esophagitis: On Fluconazole X 10 days-->>ended 06/09  10. T2DM: Hgb A1C-             --continue Semglee 6 units with novolog prn every 4 hours          CBG (last 3)  Recent Labs    08/21/21 2332 08/22/21 0431 08/22/21 0826  GLUCAP 109* 143* 153*   Reasonably Controlled 6/10 11. Amyloidosis: chronic right CN VI palsy 12. ESBL Kleb/Citrobacter: Asymptomatic bacteruria being monitored.  13.  Leucocytosis: Resolved see below  14. Prostate cancer s/p XRT: On hormone replacement therapy  15. Iron deficiency/ABLA:  On iron supplement. HGB 8.0 Monitor     Latest Ref Rng & Units 08/20/2021    5:51 AM 08/19/2021    3:23 AM 08/17/2021    3:10 AM  CBC  WBC 4.0 -  10.5 K/uL 9.6  9.7  10.1   Hemoglobin 13.0 - 17.0 g/dL 8.6  8.0  8.6   Hematocrit 39.0 - 52.0 % 27.2  25.7  27.4   Platelets 150 - 400 K/uL 615  585  575      14. Tachycardia: On low dose BB.  Noted to have frequent PVCs. He had prolonged QT that resolved with electrolyte repletion. Vitals:   08/21/21 1942 08/22/21 0431  BP: (!) 119/58 (!) 108/46  Pulse: 91 78  Resp: 18 18  Temp: 97.8 F (36.6 C) 97.8 F (36.6 C)  SpO2: 100% 98%    15. Hiccups: Continue gabapentin 100 mg TID. Previously on thorazine 16. Multiple myeloma. Ixazomib was noted on d/c meds from [name], 70m Dexamethazone weekly, held during recent admission 17. RIght Parotid gland lesion increasing in size since 2019 . Will need ENT f/u, pt states he was unaware of this finding  18. Orthostatic and Essential Tremor. BB may be providing benefit.    LOS: 3 days A FACE TO FACE EVALUATION WAS PERFORMED  ZMeredith Staggers6/12/2021, 9:52 AM

## 2021-08-23 DIAGNOSIS — E119 Type 2 diabetes mellitus without complications: Secondary | ICD-10-CM | POA: Diagnosis not present

## 2021-08-23 DIAGNOSIS — R5381 Other malaise: Secondary | ICD-10-CM | POA: Diagnosis not present

## 2021-08-23 LAB — GLUCOSE, CAPILLARY
Glucose-Capillary: 136 mg/dL — ABNORMAL HIGH (ref 70–99)
Glucose-Capillary: 146 mg/dL — ABNORMAL HIGH (ref 70–99)
Glucose-Capillary: 161 mg/dL — ABNORMAL HIGH (ref 70–99)
Glucose-Capillary: 180 mg/dL — ABNORMAL HIGH (ref 70–99)
Glucose-Capillary: 91 mg/dL (ref 70–99)

## 2021-08-23 MED ORDER — FREE WATER
150.0000 mL | Status: DC
  Administered 2021-08-23 – 2021-08-31 (×39): 150 mL

## 2021-08-23 NOTE — Progress Notes (Signed)
Occupational Therapy Session Note  Patient Details  Name: William Barron MRN: [id number] Date of Birth: 01-Dec-1945  Today's Date: 08/23/2021 OT Individual Time:1045-1145AM, 1330-1400PM OT Individual Time Calculation (min): 30 min , 60Mins   Short Term Goals: Week 1:  OT Short Term Goal 1 (Week 1): Pt will perform toileting with supervision OT Short Term Goal 2 (Week 1): Pt will be able to stand without UE support - for clothing mangement OT Short Term Goal 3 (Week 1): LTG=STG Week 2:     Skilled Therapeutic Interventions/Progress Updates:   OT treatment session (1):   Patient seated at w/c LOF Patient indicated that he presented with challenges with his BP, I recorded his BP at 98/56 for his resting BP with a O2 saturation of 100 on room air with a pulse of 81 and a HR of 71.  The pt's wife was present at the time of treatment.  The pt completed a BADLfor bathing  at chair LOF with bedside table in place.  The pt was able to bathe his UB with s/u assist, he required ModA for LB task completion secondary to tube placement. The was able to transfer from sit to stand using the RW for balance while bathing his perineal area and bottom. The pt returned to w/c LOF and was able to donn his over head shirt with s/u assist and ModA for his donning his underwear and shorts. The pt was able to push himself to the sink for brushing his teeth  and combing his hair with s/u assist.  The pt had a reported BP following activity of 105/53 , HR 70, 02 saturation of 100 % on room air, with a pulse of 76. The pt remained at w/c LOF. At the the completion of the first treatment session the pt was instructed in  UB towel exercises, in various planes, shld flexion, horizontal abduction, and shld rotation.  At the end of skilled OT treatment  session, the bedside table and call light were within reach and the pt's alarm was activated.The pt had no c/o pain at the time of treatment.  OT treatment session (2):  Patient  in bed upon arrival, pt agreed to eat meal EOB, pt able to transfer from supine to EOB with SBA.  The pt was able to remain seated EOB for meal completion greater than 20 minutes in duration.  The pt complete UB exercises 2 sets of 15 with rest breaks as needed.  The pt  performed bicep curls, horizontal abduction, and shld flexion using a 3lb dumb bell for execution. The  pt expressed a desire to end the 2nd treatment session early secondary to experiencing dizziness, nursing had indicated that he may be reluctant to participate secondary to the same precipitant. The pt returned to bed LOF with his call light and bedside table within reach with the his alarm activated. The patient had no report of pain this treatment session.    Therapy Documentation Precautions:  Precautions Precautions: Fall, Other (comment) Precaution Comments: GJ tube, orthostatic tremors, orthostatic vitals, clear liquids only Restrictions Weight Bearing Restrictions: No   Therapy/Group: Individual Therapy  Yvonne [name] 08/23/2021, 5:23 PM

## 2021-08-23 NOTE — Progress Notes (Addendum)
Patient reports frequent dizziness. Reinforced education on fluid intake (Patient verbalized understanding) and ensured staff is using the correct size blood pressure cuff. Notified Dr. Naaman Plummer of findings. New orders placed.     Yehuda Mao, LPN

## 2021-08-23 NOTE — Progress Notes (Signed)
Physical Therapy Session Note  Patient Details  Name: William Barron MRN: [id number] Date of Birth: [birth year]  Today's Date: 08/23/2021 PT Individual Time: 0905-1020 PT Individual Time Calculation (min): 75 min   Short Term Goals: Week 1:  PT Short Term Goal 1 (Week 1): Pt will perform supine<>sit with supervision consistently PT Short Term Goal 2 (Week 1): Pt will perform sit<>stands using LRAD with CGA consistently PT Short Term Goal 3 (Week 1): Pt will perform bed<>chair transfers using LRAD with CGA consistently PT Short Term Goal 4 (Week 1): Pt will ambulate at least 153f using LRAD with CGA PT Short Term Goal 5 (Week 1): Pt will navigate at least 4 steps using R HR only to simulate home environment with CGA  Skilled Therapeutic Interventions/Progress Updates:  Pt on toilet with RN.  He denied pain.  Pt continent of bowel.  Peri care with set up.  Toilet transfer with CGA and use of wall bar.  Clothing mgt total assist by PT.  Hand washing at sink from wc level, set up.  Pt on continuous tube feeds.   No c/o dizziness , but BP in sitting 84/52; HR 72. PT donned thigh high TEDS.  Pt donned shoes in sitting with set up.  Repeat BP same; [facility] who will double check later   Wc propulsion on level tile, x 100' with supervision.  Strengthening from wc level, using Kinetron at resistance 30cm/sec, x 50 cycles targeting quadriceps, x 20 x 2 cycles targeting gluteals.  Gait training with RW on level tile, x 27', x 25' with 5 min seated rest between bouts.  Distance limited by dizziness and LE tremors.    Wife arrived and had many questions about contact precautions, DC planning, equipment, etc.  This PT started pt and family ed, but needs are significant.  Wife does not have any help at home at this time; she has talked to CIR CSW.   At end of session, pt seated in wc with needs at hand , seat belt alarm set, and Erica, LPN and wife present      Therapy  Documentation Precautions:  Precautions Precautions: Fall, Other (comment) Precaution Comments: GJ tube, orthostatic tremors, orthostatic vitals, clear liquids only Restrictions Weight Bearing Restrictions: No         Therapy/Group: Individual Therapy  Castin Donaghue 08/23/2021, 12:48 PM

## 2021-08-23 NOTE — Progress Notes (Signed)
PROGRESS NOTE   Subjective/Complaints:  No problems with clamping of g-port yesterday. No bm reported since 6/9. Had questions about his dx. Told me that the doctor told him he had pancreatic cancer and that's not what he understood.   ROS: Patient denies fever, rash, sore throat, blurred vision, dizziness, nausea, vomiting, diarrhea, cough, shortness of breath or chest pain, joint or back/neck pain, headache, or mood change.   Objective:   No results found. No results for input(s): "WBC", "HGB", "HCT", "PLT" in the last 72 hours.  No results for input(s): "NA", "K", "CL", "CO2", "GLUCOSE", "BUN", "CREATININE", "CALCIUM" in the last 72 hours.   Intake/Output Summary (Last 24 hours) at 08/23/2021 0935 Last data filed at 08/23/2021 0844 Gross per 24 hour  Intake 1000 ml  Output 900 ml  Net 100 ml        Physical Exam: Vital Signs Blood pressure (!) 116/48, pulse 73, temperature 98.4 F (36.9 C), temperature source Oral, resp. rate 18, height '5\' 5"'  (1.651 m), weight 56.9 kg, SpO2 97 %.   Constitutional: No distress . Vital signs reviewed. HEENT: NCAT, EOMI, oral membranes moist Neck: supple Cardiovascular: RRR without murmur. No JVD    Respiratory/Chest: CTA Bilaterally without wheezes or rales. Normal effort    GI/Abdomen: BS +, non-tender, non-distended, J tube with feeding, g tube clamped with bag to gravity.  Ext: no clubbing, cyanosis, or edema Psych: pleasant and cooperative  Skin: No evidence of breakdown, no evidence of rash. Left forearm wrapped Neurologic: Cranial nerves II through XII intact, motor strength is 4+/5 in bilateral deltoid, bicep, tricep, grip, hip flexor, knee extensors, ankle dorsiflexor and plantar flexor RIght CN 6 still present. HOH Musculoskeletal: Full range of motion in all 4 extremities. No joint swelling   Assessment/Plan: 1. Functional deficits which require 3+ hours per day of  interdisciplinary therapy in a comprehensive inpatient rehab setting. Physiatrist is providing close team supervision and 24 hour management of active medical problems listed below. Physiatrist and rehab team continue to assess barriers to discharge/monitor patient progress toward functional and medical goals  Care Tool:  Bathing    Body parts bathed by patient: Right arm, Left arm, Chest, Abdomen, Front perineal area, Buttocks, Right upper leg, Left upper leg   Body parts bathed by helper: Buttocks     Bathing assist Assist Level: Minimal Assistance - Patient > 75%     Upper Body Dressing/Undressing Upper body dressing   What is the patient wearing?: Pull over shirt    Upper body assist Assist Level: Set up assist    Lower Body Dressing/Undressing Lower body dressing      What is the patient wearing?: Underwear/pull up     Lower body assist Assist for lower body dressing: Moderate Assistance - Patient 50 - 74%     Toileting Toileting    Toileting assist Assist for toileting: Moderate Assistance - Patient 50 - 74%     Transfers Chair/bed transfer  Transfers assist     Chair/bed transfer assist level: Moderate Assistance - Patient 50 - 74% Chair/bed transfer assistive device: Armrests, Emergency planning/management officer  level: Moderate Assistance - Patient 50 - 74% Assistive device: Walker-rolling Max distance: 64f   Walk 10 feet activity   Assist     Assist level: Minimal Assistance - Patient > 75% Assistive device: Walker-rolling   Walk 50 feet activity   Assist Walk 50 feet with 2 turns activity did not occur: Safety/medical concerns         Walk 150 feet activity   Assist Walk 150 feet activity did not occur: Safety/medical concerns         Walk 10 feet on uneven surface  activity   Assist Walk 10 feet on uneven surfaces activity did not occur: Safety/medical concerns          Wheelchair     Assist Is the patient using a wheelchair?: Yes (only for transportation purposes) Type of Wheelchair: Manual    Wheelchair assist level: Dependent - Patient 0%      Wheelchair 50 feet with 2 turns activity    Assist        Assist Level: Dependent - Patient 0%   Wheelchair 150 feet activity     Assist      Assist Level: Dependent - Patient 0%   Blood pressure (!) 116/48, pulse 73, temperature 98.4 F (36.9 C), temperature source Oral, resp. rate 18, height '5\' 5"'  (1.651 m), weight 56.9 kg, SpO2 97 %.  Medical Problem List and Plan: 1. Functional deficits secondary to Debility s/p Whipple procedure 07/13/21             -patient may shower cover J tube             -ELOS/Goals: 7-10 days -Continue CIR therapies including PT, OT   2.  Antithrombotics: -DVT/anticoagulation:  Pharmaceutical: Lovenox             -antiplatelet therapy: N/a 3. Pain Management:  oxycodone prn.  4. Mood: LCSW to follow for evaluation and support.              -antipsychotic agents: N/A 5. Neuropsych: This patient is capable of making decisions on his own behalf. 6. Skin/Wound Care: Monitor wound for healing.  7. Fluids/Electrolytes/Nutrition: Monitor I/O.   8.Whipple for IPMN (Dr. NHyman Hopes: On tube feeds via J tube.             -Clear liquids for comfort although he doesn't want much po right now            -6/11 g-port clamped during the day with venting. Unclamped at night. No issues so far.   -I spoke with pt re: diagnosis (pancreatic cyst). He expressed some relief in the confirmation of dx 9. Candidal esophagitis: On Fluconazole X 10 days-->>ended 06/09  10. T2DM: Hgb A1C-             --continue Semglee 6 units with novolog prn every 4 hours          CBG (last 3)  Recent Labs    08/22/21 2331 08/23/21 0425 08/23/21 0758  GLUCAP 127* 136* 146*   Reasonably Controlled 6/11 11. Amyloidosis: chronic right CN VI palsy 12. ESBL Kleb/Citrobacter: Asymptomatic  bacteruria being monitored.  13.  Leucocytosis: Resolved see below  14. Prostate cancer s/p XRT: On hormone replacement therapy  15. Iron deficiency/ABLA: On iron supplement. HGB 8.0 Monitor     Latest Ref Rng & Units 08/20/2021    5:51 AM 08/19/2021    3:23 AM 08/17/2021    3:10 AM  CBC  WBC 4.0 - 10.5 K/uL  9.6  9.7  10.1   Hemoglobin 13.0 - 17.0 g/dL 8.6  8.0  8.6   Hematocrit 39.0 - 52.0 % 27.2  25.7  27.4   Platelets 150 - 400 K/uL 615  585  575     6/11 labs ordered for Monday   14. Tachycardia: On low dose BB.  Noted to have frequent PVCs. He had prolonged QT that resolved with electrolyte repletion. Vitals:   08/22/21 1943 08/23/21 0423  BP: 107/61 (!) 116/48  Pulse: 84 73  Resp: 18 18  Temp: 98.3 F (36.8 C) 98.4 F (36.9 C)  SpO2: 100% 97%    15. Hiccups: Continue gabapentin 100 mg TID. Previously on thorazine 16. Multiple myeloma. Ixazomib was noted on d/c meds from [name], 85m Dexamethazone weekly, held during recent admission 17. RIght Parotid gland lesion increasing in size since 2019 . Will need ENT f/u, pt states he was unaware of this finding  18. Orthostatic and Essential Tremor. BB may be providing benefit.    LOS: 4 days A FACE TO FACE EVALUATION WAS PERFORMED  ZMeredith Staggers6/01/2022, 9:35 AM

## 2021-08-24 DIAGNOSIS — R5381 Other malaise: Secondary | ICD-10-CM | POA: Diagnosis not present

## 2021-08-24 LAB — BASIC METABOLIC PANEL
Anion gap: 8 (ref 5–15)
BUN: 18 mg/dL (ref 8–23)
CO2: 28 mmol/L (ref 22–32)
Calcium: 8.5 mg/dL — ABNORMAL LOW (ref 8.9–10.3)
Chloride: 100 mmol/L (ref 98–111)
Creatinine, Ser: 0.92 mg/dL (ref 0.61–1.24)
GFR, Estimated: >60 mL/min (ref >60)
Glucose, Bld: 140 mg/dL — ABNORMAL HIGH (ref 70–99)
Potassium: 3.5 mmol/L (ref 3.5–5.1)
Sodium: 136 mmol/L (ref 135–145)

## 2021-08-24 LAB — CBC
HCT: 25.9 % — ABNORMAL LOW (ref 39.0–52.0)
Hemoglobin: 8.2 g/dL — ABNORMAL LOW (ref 13.0–17.0)
MCH: 27.2 pg (ref 26.0–34.0)
MCHC: 31.7 g/dL (ref 30.0–36.0)
MCV: 86 fL (ref 80.0–100.0)
Platelets: 539 10*3/uL — ABNORMAL HIGH (ref 150–400)
RBC: 3.01 MIL/uL — ABNORMAL LOW (ref 4.22–5.81)
RDW: 15.2 % (ref 11.5–15.5)
WBC: 6 10*3/uL (ref 4.0–10.5)
nRBC: 0 % (ref 0.0–0.2)

## 2021-08-24 LAB — GLUCOSE, CAPILLARY
Glucose-Capillary: 135 mg/dL — ABNORMAL HIGH (ref 70–99)
Glucose-Capillary: 147 mg/dL — ABNORMAL HIGH (ref 70–99)
Glucose-Capillary: 161 mg/dL — ABNORMAL HIGH (ref 70–99)
Glucose-Capillary: 162 mg/dL — ABNORMAL HIGH (ref 70–99)
Glucose-Capillary: 164 mg/dL — ABNORMAL HIGH (ref 70–99)
Glucose-Capillary: 187 mg/dL — ABNORMAL HIGH (ref 70–99)
Glucose-Capillary: 84 mg/dL (ref 70–99)

## 2021-08-24 NOTE — Progress Notes (Signed)
Physical Therapy Session Note  Patient Details  Name: William Barron MRN: [id number] Date of Birth: 25-Jul-1945  Today's Date: 08/24/2021 PT Individual Time: 1030-1055 and 1415-1530 PT Individual Time Calculation (min): 25 min and 75 min  Short Term Goals: Week 1:  PT Short Term Goal 1 (Week 1): Pt will perform supine<>sit with supervision consistently PT Short Term Goal 2 (Week 1): Pt will perform sit<>stands using LRAD with CGA consistently PT Short Term Goal 3 (Week 1): Pt will perform bed<>chair transfers using LRAD with CGA consistently PT Short Term Goal 4 (Week 1): Pt will ambulate at least 177f using LRAD with CGA PT Short Term Goal 5 (Week 1): Pt will navigate at least 4 steps using R HR only to simulate home environment with CGA Week 2:    Week 3:     Skilled Therapeutic Interventions/Progress Updates:    AM SESSION Initially oob in wc.  No alarm in use.  Mod I wc propulsion to doorway to meet PT. Propels 556fto gym mod I w/Ues.  Seated LE therex as warmup due to OH - LAQs x 20 Heel raises x 15  Sit to stand w/min assist to RW. Gait 2534f 2 w/RW, min assist, cues for upright posture/gaze.  Repeated Sit to stand x 8 for functional strengfthening. Wc propulsion x [id number] UE/Les Pt propels doorway into room mod I.   PM SESSION Pt initially supine and agreeable to session w/focus on functional strengthening. Supine to sit w/use of rail and supervision, hob elevated. stand pivot transfer to wc no AD min assist. Transported to gym.  Seated warmup: LAQs, marching, heel raises, minisquats x 10-12 each  At stairs: Ascended 2 stairs/backed 2 stairs x 2 w/2 rails and cga (limited by feeding tube). Repeated x 6 w/seated rest between each set of 4 passes. (Total 24)  Seated resisted hip abd using green theraband resistance 3x10.  Sidestepping at hallway rail 10 steps L/R total of 6 reps w/seated rest between each, cga, tremors increase w/fatigue.  Wc  propulsion using Les for hamstring strengthening 40ft17f, rest between efforts.  Gait 20ft 43fway to bed w/RW, cga, min assist w/Sit to stand due to mild post tendency likely due to fatigue, turn/sit to edge of bed w/min assist sidestepping to head of bed. Sit to supine w/supervision, hob elevated, use of rail.  Pt left supine w/rails up x 3, alarm set, bed in lowest position, and needs in reach.  Therapy Documentation Precautions:  Precautions Precautions: Fall, Other (comment) Precaution Comments: GJ tube, orthostatic tremors, orthostatic vitals, clear liquids only Restrictions Weight Bearing Restrictions: No   O   Therapy/Group: Individual Therapy BarbarCallie Fielding [name], 10:58 AM

## 2021-08-24 NOTE — Progress Notes (Signed)
Occupational Therapy Session Note  Patient Details  Name: William Barron MRN: [id number] Date of Birth: 1945/07/05  Today's Date: 08/24/2021 OT Individual Time: 1130-1200 OT Individual Time Calculation (min): 30 min    Short Term Goals: Week 1:  OT Short Term Goal 1 (Week 1): Pt will perform toileting with supervision OT Short Term Goal 2 (Week 1): Pt will be able to stand without UE support - for clothing mangement OT Short Term Goal 3 (Week 1): LTG=STG  Skilled Therapeutic Interventions/Progress Updates:    Pt sitting up in w/c, no c/o pain, agreeable to OT session. Reports he has completed all self care and interested in working on strength and balance during this session.  Transported pt to large gym via w/c.  Assessed BP 111/48 pulse 70 and asymptomatic (with TEDs donned) Stand pivot completed with CGA (and assist to manage Jdrain/foley bag) and pt opting to complete without RW.  Pt completed blocked practice sit<>stands holding 4.5lb small medicine ball BUE 3 x 5 reps.  Educated pt on shifting weight anteriorly using cue "put weight through balls of your feet" and also on benefits of widening BOS to improve balance.  Able to fade assist from minA to close supervision during sit<>stand and static standing with corrections.  Transported pt back to room, call bell in reach, seat alarm on.  Therapy Documentation Precautions:  Precautions Precautions: Fall, Other (comment) Precaution Comments: GJ tube, orthostatic tremors, orthostatic vitals, clear liquids only Restrictions Weight Bearing Restrictions: No    Therapy/Group: Individual Therapy  [name] 08/24/2021, 12:32 PM

## 2021-08-24 NOTE — Progress Notes (Addendum)
PROGRESS NOTE   Subjective/Complaints:  Discussed post op care with pt's surgeon at [facility]  Discussed benign neoplasm, also goal of back to baseline after pt resumes po and GJ tube is removed   ROS: Patient denies CP, SOB, N/V/D Objective:   No results found. Recent Labs    08/24/21 0603  WBC 6.0  HGB 8.2*  HCT 25.9*  PLT 539*    Recent Labs    08/24/21 0603  NA 136  K 3.5  CL 100  CO2 28  GLUCOSE 140*  BUN 18  CREATININE 0.92  CALCIUM 8.5*     Intake/Output Summary (Last 24 hours) at 08/24/2021 0958 Last data filed at 08/24/2021 0853 Gross per 24 hour  Intake 0 ml  Output 900 ml  Net -900 ml         Physical Exam: Vital Signs Blood pressure (!) 114/51, pulse 75, temperature 98.3 F (36.8 C), temperature source Oral, resp. rate 18, height 5' 5" (1.651 m), weight 57.2 kg, SpO2 96 %.    General: No acute distress Mood and affect are appropriate Heart: Regular rate and rhythm no rubs murmurs or extra sounds Lungs: Clear to auscultation, breathing unlabored, no rales or wheezes Abdomen: Positive bowel sounds, soft nontender to palpation, nondistended, GJ site CDI Extremities: No clubbing, cyanosis, or edema Skin: No evidence of breakdown, no evidence of rash   Ext: no clubbing, cyanosis, or edema Psych: pleasant and cooperative  Skin: No evidence of breakdown, no evidence of rash. Left forearm wrapped Neurologic: Cranial nerves II through XII intact, motor strength is 4+/5 in bilateral deltoid, bicep, tricep, grip, hip flexor, knee extensors, ankle dorsiflexor and plantar flexor RIght CN 6 still present. HOH Musculoskeletal: Full range of motion in all 4 extremities. No joint swelling   Assessment/Plan: 1. Functional deficits which require 3+ hours per day of interdisciplinary therapy in a comprehensive inpatient rehab setting. Physiatrist is providing close team supervision and 24 hour management  of active medical problems listed below. Physiatrist and rehab team continue to assess barriers to discharge/monitor patient progress toward functional and medical goals  Care Tool:  Bathing    Body parts bathed by patient: Right arm, Left arm, Chest, Abdomen, Front perineal area, Buttocks, Right upper leg, Left upper leg   Body parts bathed by helper: Buttocks     Bathing assist Assist Level: Minimal Assistance - Patient > 75%     Upper Body Dressing/Undressing Upper body dressing   What is the patient wearing?: Pull over shirt    Upper body assist Assist Level: Set up assist    Lower Body Dressing/Undressing Lower body dressing      What is the patient wearing?: Underwear/pull up     Lower body assist Assist for lower body dressing: Moderate Assistance - Patient 50 - 74%     Toileting Toileting    Toileting assist Assist for toileting: Moderate Assistance - Patient 50 - 74%     Transfers Chair/bed transfer  Transfers assist     Chair/bed transfer assist level: Moderate Assistance - Patient 50 - 74% Chair/bed transfer assistive device: Armrests, Programmer, multimedia   Ambulation assist  Assist level: Contact Guard/Touching assist Assistive device: Walker-rolling Max distance: 27   Walk 10 feet activity   Assist     Assist level: Contact Guard/Touching assist Assistive device: Walker-rolling   Walk 50 feet activity   Assist Walk 50 feet with 2 turns activity did not occur: Safety/medical concerns         Walk 150 feet activity   Assist Walk 150 feet activity did not occur: Safety/medical concerns         Walk 10 feet on uneven surface  activity   Assist Walk 10 feet on uneven surfaces activity did not occur: Safety/medical concerns         Wheelchair     Assist Is the patient using a wheelchair?: Yes Type of Wheelchair: Manual    Wheelchair assist level: Supervision/Verbal cueing Max wheelchair  distance: 100    Wheelchair 50 feet with 2 turns activity    Assist        Assist Level: Supervision/Verbal cueing   Wheelchair 150 feet activity     Assist      Assist Level: Dependent - Patient 0%   Blood pressure (!) 114/51, pulse 75, temperature 98.3 F (36.8 C), temperature source Oral, resp. rate 18, height 5' 5" (1.651 m), weight 57.2 kg, SpO2 96 %.  Medical Problem List and Plan: 1. Functional deficits secondary to Debility s/p Whipple procedure 07/13/21             -patient may shower cover J tube             -ELOS/Goals: 7-10 days -Continue CIR therapies including PT, OT   2.  Antithrombotics: -DVT/anticoagulation:  Pharmaceutical: Lovenox             -antiplatelet therapy: N/a 3. Pain Management:  oxycodone prn.  4. Mood: LCSW to follow for evaluation and support.              -antipsychotic agents: N/A 5. Neuropsych: This patient is capable of making decisions on his own behalf. 6. Skin/Wound Care: Monitor wound for healing.  7. Fluids/Electrolytes/Nutrition: Monitor I/O.   8.Whipple for IPMN (Dr. Hyman Hopes): On tube feeds via J tube.             -Clear liquids tolerated Tolerating q4h clamp will extend to q6h, goal is 24 h    9. Candidal esophagitis: On Fluconazole X 10 days-->>ended 06/09  10. T2DM: Hgb A1C-             --continue Semglee 6 units with novolog prn every 4 hours          CBG (last 3)  Recent Labs    08/23/21 2335 08/24/21 0355 08/24/21 0758  GLUCAP 161* 135* 162*    Controlled 6/12 11. Amyloidosis: chronic right CN VI palsy 12. ESBL Kleb/Citrobacter: Asymptomatic bacteruria being monitored.  13.  Leucocytosis: Resolved see below  14. Prostate cancer s/p XRT: On hormone replacement therapy  15. Iron deficiency/ABLA: On iron supplement. HGB 8.0 Monitor     Latest Ref Rng & Units 08/24/2021    6:03 AM 08/20/2021    5:51 AM 08/19/2021    3:23 AM  CBC  WBC 4.0 - 10.5 K/uL 6.0  9.6  9.7   Hemoglobin 13.0 - 17.0 g/dL 8.2  8.6  8.0    Hematocrit 39.0 - 52.0 % 25.9  27.2  25.7   Platelets 150 - 400 K/uL 539  615  585     6/12 Hgb stable   14. Tachycardia:  On low dose BB.  Noted to have frequent PVCs. He had prolonged QT that resolved with electrolyte repletion. Vitals:   08/23/21 2002 08/24/21 0354  BP: (!) 117/58 (!) 114/51  Pulse: 82 75  Resp: 18 18  Temp: 98.6 F (37 C) 98.3 F (36.8 C)  SpO2: 99% 96%    15. Hiccups: Continue gabapentin 100 mg TID. Previously on thorazine 16. Multiple myeloma. Ixazomib was noted on d/c meds from [name], 24m Dexamethazone weekly, held during recent admission 17. RIght Parotid gland lesion increasing in size since 2019 . Will need ENT f/u, pt states he was unaware of this finding  18. Orthostatic and Essential Tremor. BB may be providing benefit.    LOS: 5 days A FACE TO FACE EVALUATION WAS PERFORMED  ACharlett Blake6/02/2022, 9:58 AM

## 2021-08-24 NOTE — Progress Notes (Signed)
Occupational Therapy Session Note  Patient Details  Name: William Barron MRN: [id number] Date of Birth: 26-Oct-1945  Today's Date: 08/24/2021 OT Individual Time: 0930-1010 OT Individual Time Calculation (min): 40 min    Short Term Goals: Week 1:  OT Short Term Goal 1 (Week 1): Pt will perform toileting with supervision OT Short Term Goal 2 (Week 1): Pt will be able to stand without UE support - for clothing mangement OT Short Term Goal 3 (Week 1): LTG=STG  Skilled Therapeutic Interventions/Progress Updates:    Pt resting in w/c upon arrival. No c/o dizziness although pt admitted that he can't recall any previous episodes on dizziness. OT intervention with focus on sit<>stand, standing balance, and functional amb with RW. Sit<>stand with CGA; increased tremors in all extremities with standing. Sit<>stand X 4. Standing balance with CGA. Amb with RW in room (10') with CGA and increased tremors in BLE. Pt returned to w/c. Pt remained in w/c with all needs within reach.  Therapy Documentation Precautions:  Precautions Precautions: Fall, Other (comment) Precaution Comments: GJ tube, orthostatic tremors, orthostatic vitals, clear liquids only Restrictions Weight Bearing Restrictions: No  Pain: Pain Assessment Pain Scale: 0-10 Pain Score: 0-No pain  Therapy/Group: Individual Therapy  Leroy Libman 08/24/2021, 10:12 AM

## 2021-08-24 NOTE — Progress Notes (Signed)
Patient ID: William Barron, male   DOB: [birth year], 76 y.o.   MRN: [id number]  Sw received a message requesting updates on patient. Sw informed spouse that SW would have an update from the team available on Wednesday.

## 2021-08-24 NOTE — Discharge Instructions (Signed)
Inpatient Rehab Discharge Instructions  JOENATHAN SAKUMA Discharge date and time:    Activities/Precautions/ Functional Status: Activity: no lifting, driving, or strenuous exercise for till cleared by MD Diet:  Wound Care: keep wound clean and dry   Functional status:  ___ No restrictions     ___ Walk up steps independently ___ 24/7 supervision/assistance   ___ Walk up steps with assistance ___ Intermittent supervision/assistance  ___ Bathe/dress independently ___ Walk with walker     ___ Bathe/dress with assistance ___ Walk Independently    ___ Shower independently ___ Walk with assistance    ___ Shower with assistance ___ No alcohol     ___ Return to work/school ________  Special Instructions:    My questions have been answered and I understand these instructions. I will adhere to these goals and the provided educational materials after my discharge from the hospital.  Patient/Caregiver Signature _______________________________ Date __________  Clinician Signature _______________________________________ Date __________  Please bring this form and your medication list with you to all your follow-up doctor's appointments.

## 2021-08-25 ENCOUNTER — Inpatient Hospital Stay (HOSPITAL_COMMUNITY): Payer: Medicare Other

## 2021-08-25 DIAGNOSIS — R5381 Other malaise: Secondary | ICD-10-CM | POA: Diagnosis not present

## 2021-08-25 LAB — GLUCOSE, CAPILLARY
Glucose-Capillary: 94 mg/dL (ref 70–99)
Glucose-Capillary: 149 mg/dL — ABNORMAL HIGH (ref 70–99)
Glucose-Capillary: 121 mg/dL — ABNORMAL HIGH (ref 70–99)
Glucose-Capillary: 108 mg/dL — ABNORMAL HIGH (ref 70–99)
Glucose-Capillary: 131 mg/dL — ABNORMAL HIGH (ref 70–99)

## 2021-08-25 MED ORDER — GABAPENTIN 250 MG/5ML PO SOLN
100.0000 mg | Freq: Two times a day (BID) | ORAL | Status: DC
  Filled 2021-08-25 (×2): qty 2

## 2021-08-25 NOTE — Progress Notes (Signed)
Physical Therapy Session Note  Patient Details  Name: William Barron MRN: [id number] Date of Birth: June 12, 1945  Today's Date: 08/25/2021 PT Individual Time: 1045-1140 PT Individual Time Calculation (min): 55 min   Short Term Goals: Week 1:  PT Short Term Goal 1 (Week 1): Pt will perform supine<>sit with supervision consistently PT Short Term Goal 2 (Week 1): Pt will perform sit<>stands using LRAD with CGA consistently PT Short Term Goal 3 (Week 1): Pt will perform bed<>chair transfers using LRAD with CGA consistently PT Short Term Goal 4 (Week 1): Pt will ambulate at least 170f using LRAD with CGA PT Short Term Goal 5 (Week 1): Pt will navigate at least 4 steps using R HR only to simulate home environment with CGA Week 2:    Week 3:     Skilled Therapeutic Interventions/Progress Updates:    Initially oob in wc Propels w/Ues/Les to main gym approx 1555fw/Ues and Les as warmup activity. Sit to stand w/cga. Gait 4871f/min to cga, occasional cues to increase base of support  Rpeated sit to stand from armchair w/isometric sustained glut/quad hold in standing x 5 sec each rep, completed 2x5reps.  Attempted heel raises in standing but limited by tremors.  Gait 26f54f2 w/min assist as above.  Stairs: ascended/descended 4 stairs w/2 rails w/min assist, cues for posture.  Pt stated activity was "tiring" for him.  Standing balance/core activation: Standing w/weighted ball (grey/light) worked on stabilizing core while raising ball (first 2 sets) then rotating ball side to side (last set) while sustaining core/maintaining balance, min assist overall for balance.    Gait 48ft3fin to cga as above.  Pt c/o nausea, requested return to room.  Transported to room.  Declined need for nausea meds, requested rest, requested to remain oob in wc.  Pt left oob in wc w/alarm belt set and needs in reach.  Wife in room and discussed pt progress, current LOF in sessions.  Scheduler contacted and  advised pt may need schedule spread out/avoid 75 min sessions.     Therapy Documentation Precautions:  Precautions Precautions: Fall, Other (comment) Precaution Comments: GJ tube, orthostatic tremors, orthostatic vitals, clear liquids only Restrictions Weight Bearing Restrictions: No    Therapy/Group: Individual Therapy BarbaCallie Fielding  BarbaJerrilyn Cairo/2023, 11:55 AM

## 2021-08-25 NOTE — Consult Note (Signed)
Referring Physician(s): Reesa Chew  Supervising Physician: Corrie Mckusick  Patient Status:  [facility] - In-pt  Chief Complaint:  Clogged J port on GJ tube  HPI: William Barron is a 76 year old male with history of amyloidosis, prostate CA s/p XRT, T2DM, CAD, pancreatic mass (IPMN) s/p Whipple with GJ tube at [facility].  His course was complicated by delirium, UTI, candidal esophagitis and need for continuous suction via G tube due to intractable hiccups?.  Patient reports original placement of tube possibly around 07/22/21.   He generally tolerates continuous feeds by J-tube.  Meds are also delivered via J-port.  The pump today discontinued working reporting a clogged tube.  Nursing was unsuccessful at getting tube to function.  IR consulted.   Allergies: Penicillins  Medications: Prior to Admission medications   Medication Sig Start Date End Date Taking? Authorizing Provider  fluconazole (DIFLUCAN) 40 MG/ML suspension Place 200 mg into feeding tube See admin instructions. Qd x 10 days    [provider]  HYDROcodone-acetaminophen (NORCO/VICODIN) 5-325 MG tablet Take 1 tablet by mouth every 4 (four) hours as needed. Patient not taking: Reported on 07/08/2021 04/17/21   Newt Minion, MD  Insulin Glargine (SEMGLEE [place]) Inject 6 Units into the skin daily at 12 noon.    [provider]  insulin regular (NOVOLIN R) 100 units/mL injection Inject 5 Units into the skin See admin instructions. Every 6 hours while continuous tube feeds are running    [provider]  melatonin 3 MG TABS tablet Place 6 mg into feeding tube at bedtime.    [provider]  omeprazole (FIRST-OMEPRAZOLE) 2 mg/mL SUSP oral suspension Place 20 mg into feeding tube daily.    [provider]  oxyCODONE (ROXICODONE) 5 MG/5ML solution Place 2.5 mg into feeding tube See admin instructions. Every 4 hours as need for pain up to 5 days    [provider]  Testosterone 20.25  MG/ACT (1.62%) GEL Apply 4 Pump topically daily. 06/13/20   [provider]  traZODone (DESYREL) 50 MG tablet Place 50 mg into feeding tube at bedtime as needed for sleep. 01/09/21   [provider]     Vital Signs: BP (!) 110/55 (BP Location: Left Arm)   Pulse 76   Temp 97.8 F (36.6 C) (Oral)   Resp 18   Ht '5\' 5"'$  (1.651 m)   Wt 126 lb 1.7 oz (57.2 kg)   SpO2 100%   BMI 20.98 kg/m   Physical Exam HENT:     Head: Normocephalic.     Mouth/Throat:     Pharynx: Oropharynx is clear.  Eyes:     Extraocular Movements: Extraocular movements intact.  Cardiovascular:     Rate and Rhythm: Normal rate.  Pulmonary:     Effort: Pulmonary effort is normal.  Abdominal:     General: Abdomen is flat.     Palpations: Abdomen is soft.  Skin:    General: Skin is dry.  Neurological:     General: No focal deficit present.     Mental Status: He is alert.   GJ tube- G port is attached to suction and working appropriately.  J- tube does not flush.  Imaging: No results found.  Labs:  CBC: Recent Labs    08/17/21 0310 08/19/21 0323 08/20/21 0551 08/24/21 0603  WBC 10.1 9.7 9.6 6.0  HGB 8.6* 8.0* 8.6* 8.2*  HCT 27.4* 25.7* 27.2* 25.9*  PLT 575* 585* 615* 539*    COAGS:  No results for input(s): "INR", "APTT" in the last 8760 hours.  BMP: Recent Labs    08/17/21 0310 08/19/21 0323 08/20/21 0551 08/24/21 0603  NA 136 135 135 136  K 4.1 3.9 3.9 3.5  CL 101 99 99 100  CO2 '25 27 27 28  '$ GLUCOSE 117* 132* 138* 140*  BUN '21 17 20 18  '$ CALCIUM 8.5* 8.6* 8.6* 8.5*  CREATININE 0.86 0.97 0.83 0.92  GFRNONAA >60 >60 >60 >60    LIVER FUNCTION TESTS: Recent Labs    08/15/21 0130 08/17/21 0310 08/19/21 0323 08/20/21 0551  BILITOT 0.4 0.5 0.3 0.5  AST '30 26 19 21  '$ ALT 42 40 29 31  ALKPHOS 626* 595* 539* 482*  PROT 5.4* 5.5* 5.5* 5.5*  ALBUMIN 2.3* 2.4* 2.3* 2.3*    Assessment and Plan:  Clogged GJ tube -many unsuccessful attempts to regain functioning  of tube bedside (flushing, declogger stylet, guidewire, manipulation of tube, etc.).  The wire traverses the exterior lumen of the J-port without incident.  Occlusion is thought to be more distal. -ordering plain film to evaluate placement of distal portion of J port -plan to replace/exchange in IR tomorrow, 08/26/21.  Electronically Signed: Pasty Spillers, PA 08/25/2021, 4:44 PM   I spent a total of 25 Minutes at the the patient's bedside AND on the patient's hospital floor or unit, greater than 50% of which was counseling/coordinating care for GJ tube malfunction

## 2021-08-25 NOTE — Progress Notes (Signed)
Occupational Therapy Session Note  Patient Details  Name: William Barron MRN: [id number] Date of Birth: [birth year]  Today's Date: 08/25/2021 OT Individual Time: 4235-3614 OT Individual Time Calculation (min): 30 min    Short Term Goals: Week 1:  OT Short Term Goal 1 (Week 1): Pt will perform toileting with supervision OT Short Term Goal 2 (Week 1): Pt will be able to stand without UE support - for clothing mangement OT Short Term Goal 3 (Week 1): LTG=STG Week 2:     Skilled Therapeutic Interventions/Progress Updates:   Patient seen this skilled OT session in room, pt expressed an interest in washing his hair at sink level , the pt was able to remove his over head shirt with SBA, he was able to place the towel around his neck with s/u assist.  The pt was able to lift bilateral UE overhead to wash his hair at sink LOF, the pt was able to dry his head and comb his hair at sink level.  The pt asked if his back could be washed and his topical cream reapplied, I was able to included into the task by having him dry his back with the towel  using bilateral UE with s/u assist. The pt remained at w/c LOF with the bedside table and call light within reach.  The alarm was activated and the pt's family was present throughout the treatment session. The pt had no c/o pain.  Therapy Documentation Precautions:  Precautions Precautions: Fall, Other (comment) Precaution Comments: GJ tube, orthostatic tremors, orthostatic vitals, clear liquids only Restrictions Weight Bearing Restrictions: No   Therapy/Group: Individual Therapy  Yvonne [name] 08/25/2021, 12:28 PM

## 2021-08-25 NOTE — Progress Notes (Signed)
Occupational Therapy Session Note  Patient Details  Name: William Barron MRN: [id number] Date of Birth: [birth year]  Today's Date: 08/25/2021 OT Individual Time: 1445-1530 OT Individual Time Calculation (min): 45 min    Short Term Goals: Week 1:  OT Short Term Goal 1 (Week 1): Pt will perform toileting with supervision OT Short Term Goal 2 (Week 1): Pt will be able to stand without UE support - for clothing mangement OT Short Term Goal 3 (Week 1): LTG=STG  Skilled Therapeutic Interventions/Progress Updates:    S: Pt resting in bed upon therapy arrival. Reports that he is fatigued although agreeable to participate in bed side OT session. Wife arrived at start of session.  While seated on EOB, patient participated in skilled OT session focusing on increasing activity tolerance, energy conservation, and BUE strengthening. Rest breaks were provided as needed to help conserve energy. Utilized 3# hand weight to perform bicep curls and hammer curls; 15X, shoulder flexion (to 90 degrees), scaption ( to 90 degrees), chest press, IR/er, 10X. Pt with questions regarding J tube and G tube. Provided education and answered questions to the best of my knowledge. When unable to provide answers, Nursing was present during portion of session and able to answer additional questions.    P: Continue to work on increase overall strength and endurance in order to increase activity tolerance and functional performance during ADL tasks.    Therapy Documentation Precautions:  Precautions Precautions: Fall, Other (comment) Precaution Comments: GJ tube, orthostatic tremors, orthostatic vitals, clear liquids only Restrictions Weight Bearing Restrictions: No  Pain: 0/10 reported during session.    Therapy/Group: Individual Therapy  Ailene Ravel, OTR/L,CBIS  Supplemental OT - [place] [facility]  08/25/2021, 8:26 AM

## 2021-08-25 NOTE — Progress Notes (Signed)
Patient tolerated 6 hour clamping, no nausea, vomiting, or abdominal discomfort noted. Encouraged po intact pt drank 127ms at 2300

## 2021-08-25 NOTE — Progress Notes (Signed)
PROGRESS NOTE   Subjective/Complaints:  Tolerated 6 h clamping , reviewed labs , discussed GJ tube weaning schedule with pt and wife as well s with RN  ROS: Patient denies CP, SOB, N/V/D Objective:   No results found. Recent Labs    08/24/21 0603  WBC 6.0  HGB 8.2*  HCT 25.9*  PLT 539*     Recent Labs    08/24/21 0603  NA 136  K 3.5  CL 100  CO2 28  GLUCOSE 140*  BUN 18  CREATININE 0.92  CALCIUM 8.5*      Intake/Output Summary (Last 24 hours) at 08/25/2021 0843 Last data filed at 08/25/2021 0500 Gross per 24 hour  Intake 290 ml  Output 1200 ml  Net -910 ml         Physical Exam: Vital Signs Blood pressure (!) 109/51, pulse 87, temperature 98.6 F (37 C), temperature source Oral, resp. rate 18, height '5\' 5"'  (1.651 m), weight 57.2 kg, SpO2 96 %.    General: No acute distress Mood and affect are appropriate Heart: Regular rate and rhythm no rubs murmurs or extra sounds Lungs: Clear to auscultation, breathing unlabored, no rales or wheezes Abdomen: Positive bowel sounds, soft nontender to palpation, nondistended, GJ site CDI Extremities: No clubbing, cyanosis, or edema Skin: No evidence of breakdown, no evidence of rash   Ext: no clubbing, cyanosis, or edema Psych: pleasant and cooperative  Skin: No evidence of breakdown, no evidence of rash. Left forearm wrapped Neurologic: Cranial nerves II through XII intact, motor strength is 4+/5 in bilateral deltoid, bicep, tricep, grip, hip flexor, knee extensors, ankle dorsiflexor and plantar flexor RIght CN 6 still present. HOH Musculoskeletal: Full range of motion in all 4 extremities. No joint swelling   Assessment/Plan: 1. Functional deficits which require 3+ hours per day of interdisciplinary therapy in a comprehensive inpatient rehab setting. Physiatrist is providing close team supervision and 24 hour management of active medical problems listed  below. Physiatrist and rehab team continue to assess barriers to discharge/monitor patient progress toward functional and medical goals  Care Tool:  Bathing    Body parts bathed by patient: Right arm, Left arm, Chest, Abdomen, Front perineal area, Buttocks, Right upper leg, Left upper leg   Body parts bathed by helper: Buttocks     Bathing assist Assist Level: Minimal Assistance - Patient > 75%     Upper Body Dressing/Undressing Upper body dressing   What is the patient wearing?: Pull over shirt    Upper body assist Assist Level: Set up assist    Lower Body Dressing/Undressing Lower body dressing      What is the patient wearing?: Underwear/pull up     Lower body assist Assist for lower body dressing: Moderate Assistance - Patient 50 - 74%     Toileting Toileting    Toileting assist Assist for toileting: Moderate Assistance - Patient 50 - 74%     Transfers Chair/bed transfer  Transfers assist     Chair/bed transfer assist level: Moderate Assistance - Patient 50 - 74% Chair/bed transfer assistive device: Armrests, Programmer, multimedia   Ambulation assist      Assist level: Contact Guard/Touching  assist Assistive device: Walker-rolling Max distance: 27   Walk 10 feet activity   Assist     Assist level: Contact Guard/Touching assist Assistive device: Walker-rolling   Walk 50 feet activity   Assist Walk 50 feet with 2 turns activity did not occur: Safety/medical concerns         Walk 150 feet activity   Assist Walk 150 feet activity did not occur: Safety/medical concerns         Walk 10 feet on uneven surface  activity   Assist Walk 10 feet on uneven surfaces activity did not occur: Safety/medical concerns         Wheelchair     Assist Is the patient using a wheelchair?: Yes Type of Wheelchair: Manual    Wheelchair assist level: Supervision/Verbal cueing Max wheelchair distance: 100    Wheelchair 50 feet  with 2 turns activity    Assist        Assist Level: Supervision/Verbal cueing   Wheelchair 150 feet activity     Assist      Assist Level: Dependent - Patient 0%   Blood pressure (!) 109/51, pulse 87, temperature 98.6 F (37 C), temperature source Oral, resp. rate 18, height '5\' 5"'  (1.651 m), weight 57.2 kg, SpO2 96 %.  Medical Problem List and Plan: 1. Functional deficits secondary to Debility s/p Whipple procedure 07/13/21             -patient may shower cover J tube             -ELOS/Goals: 7-10 days -Continue CIR therapies including PT, OT   2.  Antithrombotics: -DVT/anticoagulation:  Pharmaceutical: Lovenox             -antiplatelet therapy: N/a 3. Pain Management:  oxycodone prn.  4. Mood: LCSW to follow for evaluation and support.              -antipsychotic agents: N/A 5. Neuropsych: This patient is capable of making decisions on his own behalf. 6. Skin/Wound Care: Monitor wound for healing.  7. Fluids/Electrolytes/Nutrition: Monitor I/O.   8.Whipple for IPMN (Dr. Hyman Hopes): On tube feeds via J tube.             -Clear liquids tolerated Tolerating q6h clamp will extend to q8h, goal is 24 h    9. Candidal esophagitis: On Fluconazole X 10 days-->>ended 06/09  10. T2DM: Hgb A1C-             --continue Semglee 6 units with novolog prn every 4 hours          CBG (last 3)  Recent Labs    08/24/21 2330 08/25/21 0332 08/25/21 0749  GLUCAP 84 94 149*    Controlled 6/12 11. Amyloidosis: chronic right CN VI palsy 12. ESBL Kleb/Citrobacter: Asymptomatic bacteruria being monitored.  13.  Leucocytosis: Resolved see below  14. Prostate cancer s/p XRT: On hormone replacement therapy  15. Iron deficiency/ABLA: On iron supplement. HGB 8.0 Monitor     Latest Ref Rng & Units 08/24/2021    6:03 AM 08/20/2021    5:51 AM 08/19/2021    3:23 AM  CBC  WBC 4.0 - 10.5 K/uL 6.0  9.6  9.7   Hemoglobin 13.0 - 17.0 g/dL 8.2  8.6  8.0   Hematocrit 39.0 - 52.0 % 25.9  27.2  25.7    Platelets 150 - 400 K/uL 539  615  585     6/12 Hgb stable   14. Tachycardia: On low dose BB.  Noted to have frequent PVCs. He had prolonged QT that resolved with electrolyte repletion. Vitals:   08/25/21 0335 08/25/21 0808  BP: (!) 103/56 (!) 109/51  Pulse: 76 87  Resp: 18   Temp: 98.6 F (37 C)   SpO2: 96%     15. Hiccups: Continue gabapentin 100 mg TID. Previously on thorazine, no hiccups will reduce dose and monitor  16. Multiple myeloma. Ixazomib was noted on d/c meds from [name], 26m Dexamethazone weekly, held during recent admission 17. RIght Parotid gland lesion increasing in size since 2019 . Will need ENT f/u, pt states he was unaware of this finding  18. Orthostatic and Essential Tremor. BB may be providing benefit.    LOS: 6 days A FACE TO FACE EVALUATION WAS PERFORMED  ACharlett Blake6/13/2023, 8:43 AM

## 2021-08-25 NOTE — Progress Notes (Signed)
Occupational Therapy Session Note  Patient Details  Name: William Barron MRN: [id number] Date of Birth: [birth year]  Today's Date: 08/25/2021 OT Individual Time: 0920-1000 OT Individual Time Calculation (min): 40 min    Short Term Goals: Week 1:  OT Short Term Goal 1 (Week 1): Pt will perform toileting with supervision OT Short Term Goal 2 (Week 1): Pt will be able to stand without UE support - for clothing mangement OT Short Term Goal 3 (Week 1): LTG=STG  Skilled Therapeutic Interventions/Progress Updates:    Pt seated in w/c upon arrival. OT intervention with focus on sit<>stand and standing balance without BUE support to increase endurance and independence with BADLs. Sit<>stand from w/c with BUE crossed on chest 3x5 and 3x8. Standing without UE support and marching in place x 5. All tasks completed with CGA. Rest breaks between each set. Pt fatigues at end of each set with noticeable increase in BLE tremors. Pt remained seated in w/c. All needs within reach. Wife present.   Therapy Documentation Precautions:  Precautions Precautions: Fall, Other (comment) Precaution Comments: GJ tube, orthostatic tremors, orthostatic vitals, clear liquids only Restrictions Weight Bearing Restrictions: No Pain: Pain Assessment Pain Scale: 0-10 Pain Score: 0-No pain   Therapy/Group: Individual Therapy  Leroy Libman 08/25/2021, 10:02 AM

## 2021-08-26 DIAGNOSIS — R5381 Other malaise: Secondary | ICD-10-CM | POA: Diagnosis not present

## 2021-08-26 LAB — GLUCOSE, CAPILLARY
Glucose-Capillary: 102 mg/dL — ABNORMAL HIGH (ref 70–99)
Glucose-Capillary: 129 mg/dL — ABNORMAL HIGH (ref 70–99)
Glucose-Capillary: 131 mg/dL — ABNORMAL HIGH (ref 70–99)
Glucose-Capillary: 138 mg/dL — ABNORMAL HIGH (ref 70–99)
Glucose-Capillary: 60 mg/dL — ABNORMAL LOW (ref 70–99)
Glucose-Capillary: 72 mg/dL (ref 70–99)
Glucose-Capillary: 72 mg/dL (ref 70–99)
Glucose-Capillary: 86 mg/dL (ref 70–99)
Glucose-Capillary: 99 mg/dL (ref 70–99)

## 2021-08-26 MED ORDER — GLUCOSE 40 % PO GEL
1.0000 | ORAL | Status: AC
  Administered 2021-08-26: 31 g via ORAL

## 2021-08-26 MED ORDER — GLUCOSE 40 % PO GEL
ORAL | Status: AC
  Administered 2021-08-26: 37.5 g
  Filled 2021-08-26: qty 1.21

## 2021-08-26 NOTE — Progress Notes (Signed)
Occupational Therapy Weekly Progress Note  Patient Details  Name: William Barron MRN: [id number] Date of Birth: 12/21/1945  Beginning of progress report period: August 19, 2021 End of progress report period: August 26, 2021  Today's Date: 08/26/2021  Patient has met 3 of 3 short term goals. Pt currently requires CGA for ADL transfers with Rw, CGA- supervision for dressing and supervision- set- up for 3/3 toileting tasks, CGA for bathing from shower level. Pt continues to present with generalized deconditioning, impaired strength/endurance, impaired balance, and baseline orthostatic tremors impacitng pts abilty to complete ADLs independently. Plan for [facility] home with wife providing supervision assist, with DC date of 6/20.   Patient continues to demonstrate the following deficits: muscle weakness, decreased cardiorespiratoy endurance, decreased memory, and decreased standing balance and decreased balance strategies and therefore will continue to benefit from skilled OT intervention to enhance overall performance with BADL and Reduce care partner burden.  Patient progressing toward long term goals..  Continue plan of care. Also a SLP eval was recommended.   OT Short Term Goals Week 1:  OT Short Term Goal 1 (Week 1): Pt will perform toileting with supervision OT Short Term Goal 1 - Progress (Week 1): Met OT Short Term Goal 2 (Week 1): Pt will be able to stand without UE support - for clothing mangement OT Short Term Goal 2 - Progress (Week 1): Met OT Short Term Goal 3 (Week 1): LTG=STG Week 2:  OT Short Term Goal 1 (Week 2): LTG=STG    Therapy Documentation Precautions:  Precautions Precautions: Fall, Other (comment) Precaution Comments: GJ tube, orthostatic tremors, orthostatic vitals, clear liquids only Restrictions Weight Bearing Restrictions: No       Therapy/Group: Individual Therapy  Corinne Ports [facility] 08/26/2021, 12:16 PM

## 2021-08-26 NOTE — Progress Notes (Signed)
Occupational Therapy Session Note  Patient Details  Name: William Barron MRN: [id number] Date of Birth: Jan 09, 1946  Today's Date: 08/26/2021 OT Individual Time: 5329-9242 OT Individual Time Calculation (min): 73 min session 1 Session 2: [id number]    Short Term Goals: Week 1:  OT Short Term Goal 1 (Week 1): Pt will perform toileting with supervision OT Short Term Goal 2 (Week 1): Pt will be able to stand without UE support - for clothing mangement OT Short Term Goal 3 (Week 1): LTG=STG  Skilled Therapeutic Interventions/Progress Updates:  Session 1: Pt greeted seated in w/c agreeable to OT intervention. Session focus on BADL reeducation, functional mobility, dynamic standing balance and decreasing overall caregiver burden.       Pt transported into bathroom with total A from w/c level for time mgmt.pt completed stand pivot into walkin shower with grab bars and CGA. Pt then reports needs to void bowels, pt completed ambulatory toilet transfer from shower to toilet without alerting OTA with supervision using grab bars for balance. Pt with + BM able to complete pericare via lateral leans with set- up assist. Pt ambulated back to shower with HHA. Pt completed bathing with overall CGA. Pt exited shower via stand pivot to w/c with CGA.  Pt completed dressing from w/c overall CGA- supervision. Pt completed grooming tasks from w/c MODI. pt with several questions regarding next level of care asking if he needed to go to SNF, continued to educate pt that at this point he is doing well enough to return home with wife with [facility] services, pt agreeable. Pt left seated in w/c with chair alarm activated and all needs within reach.                        Session 2: Pt greeted seated in w/c reporting fatigue with wife present.     Session focus on family educational session and DC planning as wife with various concerns related to next level of care. Overall, wife is mostly concerned about managing GJ tube at  home/ tube feedings, deferred these concerns to nursing but did educate wife and pt that if pt were to go home with tube feedings education/training would be provided. Educated wife that pt will likely be at modified level of independence for BADLS and ADL transfer with RW. Wife still feels like they will need 24/7 nursing care d/t concerns with GJ tube. Did educate wife on plan for "GJ tube weaning schedule" per Dr. Raliegh Ip note. Wife still very concerned about home set up asking for hospital bed as wife worries about pt using stair lift to get up to his bed room, education provided that pt is completing 4 stairs at this time with [facility] and would only need to do 4 to get up to his stair lift. Reported to wife that pt was able to shower this AM with CGA, wife reports she would not be able to assist pt in the bathroom with any task. Will continue to work towards pt being MODI for ADLs. Also reached out to SW to meet with wife about goals of care. Pt left seated in w/c with all needs within reach.    Therapy Documentation Precautions:  Precautions Precautions: Fall, Other (comment) Precaution Comments: GJ tube, orthostatic tremors, orthostatic vitals, clear liquids only Restrictions Weight Bearing Restrictions: No  Pain: Session 1: no pain reported  Session 2: no pain reported other than mild nausea from being NPO.     Therapy/Group: Individual  Therapy  William Barron 08/26/2021, 12:08 PM

## 2021-08-26 NOTE — Progress Notes (Addendum)
PROGRESS NOTE   Subjective/Complaints:  Tolerated 8 h clamping , reviewed labs , discussed GJ tube weaning schedule with surgeon at [facility], Dr Hyman Hopes yesterday as well as ok to advance diet to soft  (after [place] replaced), anticipate need for supplemental feeds  Pt declines appetite stimulant   ROS: Patient denies CP, SOB, N/V/D Objective:   DG Abd 1 View  Result Date: 08/25/2021 CLINICAL DATA:  Clogged J port EXAM: ABDOMEN - 1 VIEW COMPARISON:  CT 08/11/2021 FINDINGS: Gastro jejunostomy balloon positioned over the stomach, tip of the catheter overlies left sacrum and is presumably within jejunum in this patient with history of prior Whipple procedure. Position of the tube similar as compared with the CT from May. Small metallic densities in the left lower quadrant are noted. There is a nonobstructed gas pattern with moderate stool. Prostate seeds are noted IMPRESSION: Gastrojejunostomy tube with tip in the left lower quadrant likely within jejunum. There are small metallic densities in the left lower quadrant. Electronically Signed   By: Donavan Foil M.D.   On: 08/25/2021 19:00   Recent Labs    08/24/21 0603  WBC 6.0  HGB 8.2*  HCT 25.9*  PLT 539*     Recent Labs    08/24/21 0603  NA 136  K 3.5  CL 100  CO2 28  GLUCOSE 140*  BUN 18  CREATININE 0.92  CALCIUM 8.5*      Intake/Output Summary (Last 24 hours) at 08/26/2021 6606 Last data filed at 08/26/2021 0119 Gross per 24 hour  Intake 0 ml  Output 150 ml  Net -150 ml         Physical Exam: Vital Signs Blood pressure (!) 121/58, pulse 77, temperature 98.2 F (36.8 C), temperature source Oral, resp. rate 18, height '5\' 5"'  (1.651 m), weight 56.3 kg, SpO2 97 %.    General: No acute distress Mood and affect are appropriate Heart: Regular rate and rhythm no rubs murmurs or extra sounds Lungs: Clear to auscultation, breathing unlabored, no rales or  wheezes Abdomen: Positive bowel sounds, soft nontender to palpation, nondistended, GJ site CDI Extremities: No clubbing, cyanosis, or edema Skin: No evidence of breakdown, no evidence of rash   Ext: no clubbing, cyanosis, or edema Psych: pleasant and cooperative  Skin: No evidence of breakdown, no evidence of rash. Left forearm wrapped Neurologic: Cranial nerves II through XII intact, motor strength is 4+/5 in bilateral deltoid, bicep, tricep, grip, hip flexor, knee extensors, ankle dorsiflexor and plantar flexor RIght CN 6 still present. HOH Musculoskeletal: Full range of motion in all 4 extremities. No joint swelling   Assessment/Plan: 1. Functional deficits which require 3+ hours per day of interdisciplinary therapy in a comprehensive inpatient rehab setting. Physiatrist is providing close team supervision and 24 hour management of active medical problems listed below. Physiatrist and rehab team continue to assess barriers to discharge/monitor patient progress toward functional and medical goals  Care Tool:  Bathing    Body parts bathed by patient: Right arm, Left arm, Chest, Abdomen, Front perineal area, Buttocks, Right upper leg, Left upper leg   Body parts bathed by helper: Buttocks     Bathing assist Assist Level: Minimal  Assistance - Patient > 75%     Upper Body Dressing/Undressing Upper body dressing   What is the patient wearing?: Pull over shirt    Upper body assist Assist Level: Set up assist    Lower Body Dressing/Undressing Lower body dressing      What is the patient wearing?: Underwear/pull up     Lower body assist Assist for lower body dressing: Moderate Assistance - Patient 50 - 74%     Toileting Toileting    Toileting assist Assist for toileting: Moderate Assistance - Patient 50 - 74%     Transfers Chair/bed transfer  Transfers assist     Chair/bed transfer assist level: Moderate Assistance - Patient 50 - 74% Chair/bed transfer assistive  device: Armrests, Programmer, multimedia   Ambulation assist      Assist level: Contact Guard/Touching assist Assistive device: Walker-rolling Max distance: 27   Walk 10 feet activity   Assist     Assist level: Contact Guard/Touching assist Assistive device: Walker-rolling   Walk 50 feet activity   Assist Walk 50 feet with 2 turns activity did not occur: Safety/medical concerns         Walk 150 feet activity   Assist Walk 150 feet activity did not occur: Safety/medical concerns         Walk 10 feet on uneven surface  activity   Assist Walk 10 feet on uneven surfaces activity did not occur: Safety/medical concerns         Wheelchair     Assist Is the patient using a wheelchair?: Yes Type of Wheelchair: Manual    Wheelchair assist level: Supervision/Verbal cueing Max wheelchair distance: 100    Wheelchair 50 feet with 2 turns activity    Assist        Assist Level: Supervision/Verbal cueing   Wheelchair 150 feet activity     Assist      Assist Level: Dependent - Patient 0%   Blood pressure (!) 121/58, pulse 77, temperature 98.2 F (36.8 C), temperature source Oral, resp. rate 18, height '5\' 5"'  (1.651 m), weight 56.3 kg, SpO2 97 %.  Medical Problem List and Plan: 1. Functional deficits secondary to Debility s/p Whipple procedure 07/13/21             -patient may shower cover J tube             -ELOS/Goals: 7-10 days -Continue CIR therapies including PT, OT   2.  Antithrombotics: -DVT/anticoagulation:  Pharmaceutical: Lovenox             -antiplatelet therapy: N/a 3. Pain Management:  oxycodone prn.  4. Mood: LCSW to follow for evaluation and support.              -antipsychotic agents: N/A 5. Neuropsych: This patient is capable of making decisions on his own behalf. 6. Skin/Wound Care: Monitor wound for healing.  7. Fluids/Electrolytes/Nutrition: Monitor I/O.   NPO until [place] replaced then ok to resume ful liquid  today , advance to soft diet in am  8.Whipple for IPMN (Dr. Hyman Hopes): On tube feeds via J tube.             -Clear liquids tolerated Tolerating q8h clamp will extend to q12h, goal is 24 h    9. Candidal esophagitis: On Fluconazole X 10 days-->>ended 06/09  10. T2DM: Hgb A1C-             --continue Semglee 6 units with novolog prn every 4 hours  CBG (last 3)  Recent Labs    08/26/21 0119 08/26/21 0359 08/26/21 0806  GLUCAP 72 86 99    Controlled 6/14 but has run low likely due to lack of J tube feeds 11. Amyloidosis: chronic right CN VI palsy 12. ESBL Kleb/Citrobacter: Asymptomatic bacteruria being monitored.  13.  Leucocytosis: Resolved see below  14. Prostate cancer s/p XRT: On hormone replacement therapy  15. Iron deficiency/ABLA: On iron supplement. HGB 8.0 Monitor     Latest Ref Rng & Units 08/24/2021    6:03 AM 08/20/2021    5:51 AM 08/19/2021    3:23 AM  CBC  WBC 4.0 - 10.5 K/uL 6.0  9.6  9.7   Hemoglobin 13.0 - 17.0 g/dL 8.2  8.6  8.0   Hematocrit 39.0 - 52.0 % 25.9  27.2  25.7   Platelets 150 - 400 K/uL 539  615  585     6/12 Hgb stable   14. Tachycardia: On low dose BB.  Noted to have frequent PVCs. He had prolonged QT that resolved with electrolyte repletion. Vitals:   08/25/21 1305 08/26/21 0401  BP: (!) 110/55 (!) 121/58  Pulse: 76 77  Resp: 18 18  Temp: 97.8 F (36.6 C) 98.2 F (36.8 C)  SpO2: 100% 97%    15. Hiccups: resolved dc gabapentin 100 mg TID. 16. Multiple myeloma. Ixazomib was noted on d/c meds from [name], 49m Dexamethazone weekly, held during recent admission 17. RIght Parotid gland lesion increasing in size since 2019 . Will need ENT f/u, pt states he was unaware of this finding  18. Orthostatic and Essential Tremor. BB may be providing benefit.    LOS: 7 days A FACE TO FACE EVALUATION WAS PERFORMED  ACharlett Blake6/14/2023, 9:37 AM

## 2021-08-26 NOTE — Progress Notes (Signed)
Nutrition Follow-up  DOCUMENTATION CODES:   Severe malnutrition in context of chronic illness  INTERVENTION:   Continue Tube feeds via J-port:  Glucerna 1.5 @ 75 mL/hr (1575 mL/day) 185 mL free water flush q4h or per MD Provides 2362 kcal, 130 gm protein, 1195 mL free water (2305 mL total free water) daily.  NUTRITION DIAGNOSIS:   Severe Malnutrition related to chronic illness as evidenced by severe muscle depletion, severe fat depletion. - Ongoing   GOAL:   Patient will meet greater than or equal to 90% of their needs - Progressing   MONITOR:   Diet advancement, Labs, TF tolerance, I & O's  REASON FOR ASSESSMENT:   New TF    ASSESSMENT:   76 y.o. male admitted to CIR for functional decline. Pt recently underwent a Whipple Procedure and placement of G-J tube 2/2 pancreatic mass on 5/1 at [facility]. Pt discharged to [facility] and was immediately sent to [facility] due to facility unable to care for pt. PMH includes prostate cancer s/p XRT, multiple myeloma, amyloidosis, HTN, T2DM, and GERD.  6/13 - diet advanced to full liquids  RD received a consult for diet education regarding diet being advanced.   Pt currently NPO for replacement of his GJ tube. Pt reports that he had been tolerating TF, with a bit of nausea. Pt weight remains stable over the past week.   Pt with lots of questions regarding what he can and cannot have when his diet is advanced. RD reached out to MD and PA to clarify what diet pt will be on when advanced. RD set up a time to meet with pt and pt wife tomorrow to review diet.   Recommend monitoring for pt intake prior to transitioning feeds.   Medications reviewed and include: Ferrous Sulfate, SSI, Protonix Labs reviewed: 24 hr CBG 60-149  Diet Order:   Diet Order             Diet NPO time specified Except for: Sips with Meds  Diet effective now                   EDUCATION NEEDS:   Education needs have been addressed  Skin:  Skin Assessment:  Reviewed RN Assessment  Last BM:  6/13  Height:  Ht Readings from Last 1 Encounters:  08/19/21 '5\' 5"'  (1.651 m)   Weight:  Wt Readings from Last 1 Encounters:  08/26/21 56.3 kg   Ideal Body Weight:  64.5 kg  BMI:  Body mass index is 20.65 kg/m.  Estimated Nutritional Needs:  Kcal:  2200-2400 Protein:  110-125 grams Fluid:  >/= 2.2 L   Hermina Barters RD, LDN Clinical Dietitian See [facility] for contact information.

## 2021-08-26 NOTE — Progress Notes (Signed)
Referring Physician(s): Dr Letta Pate  Supervising Physician: Aletta Edouard  Patient Status:  [facility] - In-pt  Chief Complaint:  GJ clogged  Subjective:  Hx Whipple surgery May 2023 at [facility] in place sometime after that Has been in use and without issue Until yesterday Noted J clogged Hayley Boisseau PAC tried to declog yesterday in room- without success  6/13 note: Clogged GJ tube -many unsuccessful attempts to regain functioning of tube bedside (flushing, declogger stylet, guidewire, manipulation of tube, etc.).  The wire traverses the exterior lumen of the J-port without incident.  Occlusion is thought to be more distal. -ordering plain film to evaluate placement of distal portion of J port -plan to replace/exchange in IR tomorrow, 08/26/21.  Pt has been eating some by mouth in last few days Scheduled today now for attempt for exchange in IR    Allergies: Penicillins  Medications: Prior to Admission medications   Medication Sig Start Date End Date Taking? Authorizing Provider  fluconazole (DIFLUCAN) 40 MG/ML suspension Place 200 mg into feeding tube See admin instructions. Qd x 10 days    [provider]  HYDROcodone-acetaminophen (NORCO/VICODIN) 5-325 MG tablet Take 1 tablet by mouth every 4 (four) hours as needed. Patient not taking: Reported on 07/08/2021 04/17/21   Newt Minion, MD  Insulin Glargine (SEMGLEE ) Inject 6 Units into the skin daily at 12 noon.    [provider]  insulin regular (NOVOLIN R) 100 units/mL injection Inject 5 Units into the skin See admin instructions. Every 6 hours while continuous tube feeds are running    [provider]  melatonin 3 MG TABS tablet Place 6 mg into feeding tube at bedtime.    [provider]  omeprazole (FIRST-OMEPRAZOLE) 2 mg/mL SUSP oral suspension Place 20 mg into feeding tube daily.    [provider]  oxyCODONE (ROXICODONE) 5 MG/5ML solution Place 2.5 mg into feeding  tube See admin instructions. Every 4 hours as need for pain up to 5 days    [provider]  Testosterone 20.25 MG/ACT (1.62%) GEL Apply 4 Pump topically daily. 06/13/20   [provider]  traZODone (DESYREL) 50 MG tablet Place 50 mg into feeding tube at bedtime as needed for sleep. 01/09/21   [provider]     Vital Signs: BP (!) 121/58 (BP Location: Right Arm)   Pulse 77   Temp 98.2 F (36.8 C) (Oral)   Resp 18   Ht '5\' 5"'$  (1.651 m)   Wt 124 lb 1.9 oz (56.3 kg)   SpO2 97%   BMI 20.65 kg/m     Imaging: DG Abd 1 View  Result Date: 08/25/2021 CLINICAL DATA:  Clogged J port EXAM: ABDOMEN - 1 VIEW COMPARISON:  CT 08/11/2021 FINDINGS: Gastro jejunostomy balloon positioned over the stomach, tip of the catheter overlies left sacrum and is presumably within jejunum in this patient with history of prior Whipple procedure. Position of the tube similar as compared with the CT from May. Small metallic densities in the left lower quadrant are noted. There is a nonobstructed gas pattern with moderate stool. Prostate seeds are noted IMPRESSION: Gastrojejunostomy tube with tip in the left lower quadrant likely within jejunum. There are small metallic densities in the left lower quadrant. Electronically Signed   By: Donavan Foil M.D.   On: 08/25/2021 19:00    Labs:  CBC: Recent Labs    08/17/21 0310 08/19/21 0323 08/20/21 0551 08/24/21 0603  WBC 10.1 9.7 9.6 6.0  HGB 8.6* 8.0* 8.6* 8.2*  HCT 27.4* 25.7* 27.2* 25.9*  PLT 575* 585* 615* 539*    COAGS: No results for input(s): "INR", "APTT" in the last 8760 hours.  BMP: Recent Labs    08/17/21 0310 08/19/21 0323 08/20/21 0551 08/24/21 0603  NA 136 135 135 136  K 4.1 3.9 3.9 3.5  CL 101 99 99 100  CO2 '25 27 27 28  '$ GLUCOSE 117* 132* 138* 140*  BUN '21 17 20 18  '$ CALCIUM 8.5* 8.6* 8.6* 8.5*  CREATININE 0.86 0.97 0.83 0.92  GFRNONAA >60 >60 >60 >60    LIVER FUNCTION TESTS: Recent Labs    08/15/21 0130  08/17/21 0310 08/19/21 0323 08/20/21 0551  BILITOT 0.4 0.5 0.3 0.5  AST '30 26 19 21  '$ ALT 42 40 29 31  ALKPHOS 626* 595* 539* 482*  PROT 5.4* 5.5* 5.5* 5.5*  ALBUMIN 2.3* 2.4* 2.3* 2.3*    Assessment and Plan:  GJ exchange Pt is aware of procedure and agreeable to proceed Consent signed and in chart  Electronically Signed: Lavonia Drafts, PA-C 08/26/2021, 9:13 AM   I spent a total of 15 Minutes at the the patient's bedside AND on the patient's hospital floor or unit, greater than 50% of which was counseling/coordinating care for GJ exchange

## 2021-08-26 NOTE — Patient Care Conference (Signed)
Inpatient RehabilitationTeam Conference and Plan of Care Update Date: 08/26/2021   Time: 10:49 AM    Patient Name: William Barron      Medical Record Number: [id number]  Date of Birth: 22-Jan-1946 Sex: Male         Room/Bed: 4W09C/4W09C-01 Payor Info: Payor: MEDICARE / Plan: MEDICARE PART A AND B / Product Type: *No Product type* /    Admit Date/Time:  08/19/2021  3:06 PM  Primary Diagnosis:  [place] [facility] Problems: Principal Problem:   Debility    Expected Discharge Date: Expected Discharge Date: 09/01/21  Team Members Present: Physician leading conference: Dr. [name] Social Worker Present: Erlene Quan, BSW Nurse Present: Dorien Chihuahua, RN PT Present: Barrie Folk, PT OT Present: Willeen Cass, OT;Mary Jabier Gauss, COTA SLP Present: Sherren Kerns, SLP PPS Coordinator present : Gunnar Fusi, SLP     Current Status/Progress Goal Weekly Team Focus  Bowel/Bladder   Conintent to bowel and bladder LBM 6/12, GJ tube in place Low intermittent suction Q6hr for 71mnutes  pt to remain in continent      Swallow/Nutrition/ Hydration             ADL's   bating/dressing-min A; functional transfers/standing balance-min A  bathing-supervision; dressing-mod I; toileting-mod I, functional transfers-mod I  activity tolerance, funcitonal transfers, standing balance,   Mobility   CGA transfers and Gait. supervisoin assist WC propulsion  Mod I transfers AND bed mobility. supervision assist Ambulation with RW  endurnace. safety. transfers. balance. family eProduct/process development scientistObservations            Pain   denies  denies pain      Skin   GJ tube to left upper quadrant, skin intact around it. Mid abdominal incision closed edges aproximated  skin to remain intact        Discharge Planning:  Patient dischargng home with spouse if at a level to provide care.If unable D/c to SNF   Team Discussion: Patient was  doing well with the G-J tube clamping 8 hours/day without N/V and passing stools. J-tube clogged; IR to change out. Will need to keep tube in for 6-8 weeks; placed originally on 07/30/21. On a D3 diet starting tomorrow; dietician consulted for soft food diet.  Patient on target to meet rehab goals: yes, currently needs mod I assist for ADLs with mod I goals set for discharge.   *See Care Plan and progress notes for long and short-term goals.   Revisions to Treatment Plan:  Downgraded goal for distance per PT   Teaching Needs: G-J tube care/maintenance, skin care, medications, dietary modifications, transfers, toileting, etc.   Current Barriers to Discharge: Decreased caregiver support and tube care  Possible Resolutions to Barriers: Family education with wife HH follow up services DME: already has a STherapist, artCurrent Status: J tube clogged, tolerating G tube clamping, appetite still poor , tremors worsened  Barriers to Discharge: Medical stability;Nutrition means;Other (comments)  Barriers to Discharge Comments: GJ tube for gastric decompression     Continued Need for Acute Rehabilitation Level of Care: The patient requires daily medical management by a physician with specialized training in physical medicine and rehabilitation for the following reasons: Direction of a multidisciplinary physical rehabilitation program to maximize functional independence : Yes Medical management of patient stability for increased activity during participation in an intensive rehabilitation regime.: Yes Analysis of  laboratory values and/or radiology reports with any subsequent need for medication adjustment and/or medical intervention. : Yes   I attest that I was present, lead the team conference, and concur with the assessment and plan of the team.   Dorien Chihuahua B 08/26/2021, 3:51 PM

## 2021-08-26 NOTE — Progress Notes (Signed)
Hypoglycemic Event  CBG: 60  Treatment: 2 tubes glucose gel  Symptoms: None  Follow-up CBG: Time:0045 CBG Result:72  Possible Reasons for Event: Inadequate meal intake  Comments/MD notified:     Harrold Donath

## 2021-08-26 NOTE — Progress Notes (Signed)
Physical Therapy Session Note  Patient Details  Name: William Barron MRN: [id number] Date of Birth: [birth year]  Today's Date: 08/26/2021 PT Individual Time: 1302-1358 PT Individual Time Calculation (min): 56 min   Short Term Goals: Week 1:  PT Short Term Goal 1 (Week 1): Pt will perform supine<>sit with supervision consistently PT Short Term Goal 2 (Week 1): Pt will perform sit<>stands using LRAD with CGA consistently PT Short Term Goal 3 (Week 1): Pt will perform bed<>chair transfers using LRAD with CGA consistently PT Short Term Goal 4 (Week 1): Pt will ambulate at least 161f using LRAD with CGA PT Short Term Goal 5 (Week 1): Pt will navigate at least 4 steps using R HR only to simulate home environment with CGA  Skilled Therapeutic Interventions/Progress Updates:     [facility]. No complaint of pain. WC transport to gym for time management. Pt completes sit to stand and stand step transfer to mat table with minA and no AD, with slight posterior bias and noted tremulousness. Pt ambulates x80' with RW and CGA, demonstrating very narrow gait pattern with bilateral shortened stride lengths. PT provides demonstration of pt's gait pattern and then demonstrates adequate stride length and width. Pt then ambulates x100' with improved gait mechanics, with PT providing CGA. Following extended seated rest break, pt ambulates additional 100' with cues to increase gait speed to decrease risk for falls. Pt does not mention any orthostatic symptoms during upright mobility. Following seated rest break, pt ambulates x20' to Nustep. Pt completes x8:00 on Niustep at workload of 4 with average steps per minute ~55. Completed for strength and endurance training. Stand pivot back to WC with RW and minA due to posterior lean. Left seated in WC with all needs within reach.  Therapy Documentation Precautions:  Precautions Precautions: Fall, Other (comment) Precaution  Comments: GJ tube, orthostatic tremors, orthostatic vitals, clear liquids only Restrictions Weight Bearing Restrictions: No   Therapy/Group: Individual Therapy  WBreck Coons PT, DPT 08/26/2021, 4:51 PM

## 2021-08-27 ENCOUNTER — Inpatient Hospital Stay (HOSPITAL_COMMUNITY): Payer: Medicare Other

## 2021-08-27 DIAGNOSIS — R5381 Other malaise: Secondary | ICD-10-CM | POA: Diagnosis not present

## 2021-08-27 HISTORY — PX: IR MECH REMOV OBSTRUC MAT ANY COLON TUBE W/FLUORO: IMG2335

## 2021-08-27 LAB — GLUCOSE, CAPILLARY
Glucose-Capillary: 103 mg/dL — ABNORMAL HIGH (ref 70–99)
Glucose-Capillary: 122 mg/dL — ABNORMAL HIGH (ref 70–99)
Glucose-Capillary: 155 mg/dL — ABNORMAL HIGH (ref 70–99)
Glucose-Capillary: 193 mg/dL — ABNORMAL HIGH (ref 70–99)
Glucose-Capillary: 239 mg/dL — ABNORMAL HIGH (ref 70–99)
Glucose-Capillary: 99 mg/dL (ref 70–99)

## 2021-08-27 MED ORDER — FERROUS SULFATE 300 (60 FE) MG/5ML PO SYRP
300.0000 mg | ORAL_SOLUTION | ORAL | Status: DC
  Administered 2021-08-28 – 2021-08-30 (×2): 300 mg
  Filled 2021-08-27 (×2): qty 5

## 2021-08-27 MED ORDER — MEGESTROL ACETATE 400 MG/10ML PO SUSP
400.0000 mg | Freq: Two times a day (BID) | ORAL | Status: DC
  Administered 2021-08-27 – 2021-08-31 (×9): 400 mg
  Filled 2021-08-27 (×9): qty 10

## 2021-08-27 MED ORDER — GLUCERNA 1.5 CAL PO LIQD
1000.0000 mL | ORAL | Status: DC
  Administered 2021-08-27 – 2021-08-30 (×4): 1000 mL
  Filled 2021-08-27 (×5): qty 1000

## 2021-08-27 MED ORDER — PROSOURCE TF PO LIQD
45.0000 mL | Freq: Two times a day (BID) | ORAL | Status: DC
  Administered 2021-08-27 – 2021-08-31 (×8): 45 mL
  Filled 2021-08-27 (×8): qty 45

## 2021-08-27 MED ORDER — MELATONIN 3 MG PO TABS
6.0000 mg | ORAL_TABLET | Freq: Every evening | ORAL | Status: DC | PRN
Start: 2021-08-27 — End: 2021-08-31
  Filled 2021-08-27: qty 2

## 2021-08-27 MED ORDER — LIDOCAINE VISCOUS HCL 2 % MT SOLN
OROMUCOSAL | Status: AC
  Administered 2021-08-27: 10 mL
  Filled 2021-08-27: qty 15

## 2021-08-27 MED ORDER — IOHEXOL 300 MG/ML  SOLN
100.0000 mL | Freq: Once | INTRAMUSCULAR | Status: AC | PRN
  Administered 2021-08-27: 20 mL

## 2021-08-27 NOTE — Progress Notes (Signed)
Nutrition Follow-up  DOCUMENTATION CODES:   Severe malnutrition in context of chronic illness  INTERVENTION:   Continue Tube feeds via J-port:  Transition to Glucerna 1.5 @ 05 mL/hr x 10 hours (800 mL/day) 45 mL ProSource TF - BID 150 mL free water flush q4h or per MD Provides 1280 kcal, 88 gm protein, 607 mL free water (1507 mL total free water) daily. Diet education  NUTRITION DIAGNOSIS:   Severe Malnutrition related to chronic illness as evidenced by severe muscle depletion, severe fat depletion. - Ongoing   GOAL:   Patient will meet greater than or equal to 90% of their needs - Progressing   MONITOR:   PO intake, Labs, Weight trends, TF tolerance  REASON FOR ASSESSMENT:   New TF    ASSESSMENT:   76 y.o. male admitted to CIR for functional decline. Pt recently underwent a Whipple Procedure and placement of G-J tube 2/2 pancreatic mass on 5/1 at [facility]. Pt discharged to [facility] and was immediately sent to [facility] due to facility unable to care for pt. PMH includes prostate cancer s/p XRT, multiple myeloma, amyloidosis, HTN, T2DM, and GERD.  6/13 - diet advanced to full liquids 6/15 - GJ tube replaced in IR; diet advanced to SOFT  Pt reports that he did not eat too much today, but tried a little. Wife brought in pt food that he likes.   Meet with pt and his wife at bedside to review diet education for Soft diet.   Provided pt and wife with "Whipple Surgery Nutrition Therapy" and "Low Fiber Nutrition Therapy" handout. Reviewed and answered any questions that they may have. Discussed foods that are recommended and foods that are not.  RD reiterated that advancement of diet and removal of the tube is managed by MD.   Jeralene Huff out to MD to change TF to nocturnal; MD approved. Discussed plan with pt. Pt understands that TF will run over night to allow him to eat during the day.   Medications reviewed and include: Megace, Ferrous Sulfate, SSI, Protonix Labs reviewed: 24 hr CBG  60-149  Diet Order:   Diet Order             DIET SOFT Room service appropriate? Yes; Fluid consistency: Thin  Diet effective now                   EDUCATION NEEDS:   Education needs have been addressed  Skin:  Skin Assessment: Reviewed RN Assessment  Last BM:  6/14  Height:  Ht Readings from Last 1 Encounters:  08/19/21 _0  (1.651 m)   Weight:  Wt Readings from Last 1 Encounters:  08/27/21 58 kg   Ideal Body Weight:  64.5 kg  BMI:  Body mass index is 21.28 kg/m.  Estimated Nutritional Needs:  Kcal:  2200-2400 Protein:  110-125 grams Fluid:  >/= 2.2 L   Hermina Barters RD, LDN Clinical Dietitian See [facility] for contact information.

## 2021-08-27 NOTE — Progress Notes (Signed)
Physical Therapy Session Note  Patient Details  Name: William Barron MRN: [id number] Date of Birth: Jul 11, 1945  Today's Date: 08/27/2021 PT Individual Time: 1115-1200 PT Individual Time Calculation (min): 45 min   Short Term Goals: Week 1:  PT Short Term Goal 1 (Week 1): Pt will perform supine<>sit with supervision consistently PT Short Term Goal 2 (Week 1): Pt will perform sit<>stands using LRAD with CGA consistently PT Short Term Goal 3 (Week 1): Pt will perform bed<>chair transfers using LRAD with CGA consistently PT Short Term Goal 4 (Week 1): Pt will ambulate at least 182f using LRAD with CGA PT Short Term Goal 5 (Week 1): Pt will navigate at least 4 steps using R HR only to simulate home environment with CGA  Skilled Therapeutic Interventions/Progress Updates: Pt presents sitting up cross-legged on flat bed.  Pt agreeable to therapy. Nursing in to cap tubes, THT donned w/ total A in supine.  Pt transfers to EOB w/ Supervision.  Pt transfers bed > w/c w/ CGA but completes full turn to transfer, education for safer turn to R.  Pt wheeled w/c w/ BUE and BLE s 100' w/ supervision, occasional cues for items to left.  Pt amb multiple rials on dayroom w/ cones to guide wide turns and verbal cues for maintaining BOS.  Pt required only CGA x 70' w/ minimal tremors noted.  Pt performed gait w/ turns to return to seat, verbal cues for safe approach and then transition stand to sit as tends to quick turn and sit w/o using UE s for controlled descent.  Pt amb back to doorway of room w/ RW and CGA approx. 947  Pt then amb into BR w/ CGA for clothing management.  Pt continent of bowel and bladder, NT to chart.  Pt amb to sink and stood for handwashing.  Pt amb x 4' to w/c and remained sitting in w/c w/ seat alarm n and all needs in reach.     Therapy Documentation Precautions:  Precautions Precautions: Fall, Other (comment) Precaution Comments: GJ tube, orthostatic tremors, orthostatic vitals,  clear liquids only Restrictions Weight Bearing Restrictions: No General:   Vital Signs: Therapy Vitals Temp: 98.4 F (36.9 C) Pulse Rate: 79 Resp: 18 BP: 103/62 Patient Position (if appropriate): Lying Oxygen Therapy SpO2: 98 % O2 Device: Room Air Pain:0/10 Pain Assessment Pain Scale: 0-10 Pain Score: 0-No pain Mobility:     Therapy/Group: Individual Therapy  JLadoris Gene6/15/2023, 12:16 PM

## 2021-08-27 NOTE — Progress Notes (Signed)
Physical Therapy Progress Note  Patient Details  Name: William Barron MRN: [id number] Date of Birth: 05-Jun-1945 Pt just returned from procedure, declined am session due to fatigue. Will attempt to see later in day if schedule allows.  Therapy Documentation Precautions:  Precautions Precautions: Fall, Other (comment) Precaution Comments: GJ tube, orthostatic tremors, orthostatic vitals, clear liquids only Restrictions Weight Bearing Restrictions: No Therapy/Group: Individual Therapy  Jerrilyn Cairo 08/27/2021, 7:41 AM

## 2021-08-27 NOTE — Progress Notes (Signed)
PROGRESS NOTE   Subjective/Complaints:  Back from radiology , [place] exchange went well, set up with RD for Low residue soft diet education  Pt asking about options post discharge, wife is concerned she will not be able to care for him at home Pt at [facility] for mobility and ADLs  Tolerating G tube clamping x 12 h  Pt passing flatus   ROS: Patient denies CP, SOB, N/V/D Objective:   DG Abd 1 View  Result Date: 08/25/2021 CLINICAL DATA:  Clogged J port EXAM: ABDOMEN - 1 VIEW COMPARISON:  CT 08/11/2021 FINDINGS: Gastro jejunostomy balloon positioned over the stomach, tip of the catheter overlies left sacrum and is presumably within jejunum in this patient with history of prior Whipple procedure. Position of the tube similar as compared with the CT from May. Small metallic densities in the left lower quadrant are noted. There is a nonobstructed gas pattern with moderate stool. Prostate seeds are noted IMPRESSION: Gastrojejunostomy tube with tip in the left lower quadrant likely within jejunum. There are small metallic densities in the left lower quadrant. Electronically Signed   By: Donavan Foil M.D.   On: 08/25/2021 19:00   No results for input(s): "WBC", "HGB", "HCT", "PLT" in the last 72 hours.   No results for input(s): "NA", "K", "CL", "CO2", "GLUCOSE", "BUN", "CREATININE", "CALCIUM" in the last 72 hours.    Intake/Output Summary (Last 24 hours) at 08/27/2021 0900 Last data filed at 08/27/2021 0607 Gross per 24 hour  Intake 120 ml  Output 300 ml  Net -180 ml         Physical Exam: Vital Signs Blood pressure 103/62, pulse 79, temperature 98.4 F (36.9 C), resp. rate 18, height _0  (1.651 m), weight 58 kg, SpO2 98 %.    General: No acute distress Mood and affect are appropriate Heart: Regular rate and rhythm no rubs murmurs or extra sounds Lungs: Clear to auscultation, breathing unlabored, no rales or wheezes Abdomen:  Positive bowel sounds, soft nontender to palpation, nondistended, GJ site CDI Extremities: No clubbing, cyanosis, or edema Skin: No evidence of breakdown, no evidence of rash   Ext: no clubbing, cyanosis, or edema Psych: pleasant and cooperative  Skin: No evidence of breakdown, no evidence of rash. Left forearm wrapped Neurologic: Cranial nerves II through XII intact, motor strength is 4+/5 in bilateral deltoid, bicep, tricep, grip, hip flexor, knee extensors, ankle dorsiflexor and plantar flexor RIght CN 6 still present. HOH Musculoskeletal: Full range of motion in all 4 extremities. No joint swelling   Assessment/Plan: 1. Functional deficits which require 3+ hours per day of interdisciplinary therapy in a comprehensive inpatient rehab setting. Physiatrist is providing close team supervision and 24 hour management of active medical problems listed below. Physiatrist and rehab team continue to assess barriers to discharge/monitor patient progress toward functional and medical goals  Care Tool:  Bathing    Body parts bathed by patient: Right arm, Left arm, Chest, Abdomen, Front perineal area, Buttocks, Right upper leg, Left upper leg, Right lower leg, Left lower leg   Body parts bathed by helper: Buttocks     Bathing assist Assist Level: Contact Guard/Touching assist     Upper  Body Dressing/Undressing Upper body dressing   What is the patient wearing?: Pull over shirt    Upper body assist Assist Level: Independent with assistive device    Lower Body Dressing/Undressing Lower body dressing      What is the patient wearing?: Underwear/pull up, Pants     Lower body assist Assist for lower body dressing: Independent with assitive device     Toileting Toileting    Toileting assist Assist for toileting: Set up assist     Transfers Chair/bed transfer  Transfers assist     Chair/bed transfer assist level: Contact Guard/Touching assist Chair/bed transfer assistive  device: Armrests, Programmer, multimedia   Ambulation assist      Assist level: Contact Guard/Touching assist Assistive device: Walker-rolling Max distance: 27   Walk 10 feet activity   Assist     Assist level: Contact Guard/Touching assist Assistive device: Walker-rolling   Walk 50 feet activity   Assist Walk 50 feet with 2 turns activity did not occur: Safety/medical concerns         Walk 150 feet activity   Assist Walk 150 feet activity did not occur: Safety/medical concerns         Walk 10 feet on uneven surface  activity   Assist Walk 10 feet on uneven surfaces activity did not occur: Safety/medical concerns         Wheelchair     Assist Is the patient using a wheelchair?: Yes Type of Wheelchair: Manual    Wheelchair assist level: Supervision/Verbal cueing Max wheelchair distance: 100    Wheelchair 50 feet with 2 turns activity    Assist        Assist Level: Supervision/Verbal cueing   Wheelchair 150 feet activity     Assist      Assist Level: Dependent - Patient 0%   Blood pressure 103/62, pulse 79, temperature 98.4 F (36.9 C), resp. rate 18, height _0  (1.651 m), weight 58 kg, SpO2 98 %.  Medical Problem List and Plan: 1. Functional deficits secondary to Debility s/p Whipple procedure 07/13/21             -patient may shower cover J tube             -ELOS/Goals: 7-10 days -Continue CIR therapies including PT, OT   2.  Antithrombotics: -DVT/anticoagulation:  Pharmaceutical: Lovenox             -antiplatelet therapy: N/a 3. Pain Management:  oxycodone prn.  4. Mood: LCSW to follow for evaluation and support.              -antipsychotic agents: N/A 5. Neuropsych: This patient is capable of making decisions on his own behalf. 6. Skin/Wound Care: Monitor wound for healing.  7. Fluids/Electrolytes/Nutrition: Monitor I/O.   NPO until [place] replaced then ok to resume ful liquid today , advance to soft diet in  am  8.Whipple for IPMN (Dr. Hyman Hopes): On tube feeds via J tube.             -Clear liquids tolerated Tolerating q8h clamp will extend to q12h, goal is 24 h    9. Candidal esophagitis: On Fluconazole X 10 days-->>ended 06/09  10. T2DM: Hgb A1C-             --continue Semglee 6 units with novolog prn every 4 hours          CBG (last 3)  Recent Labs    08/26/21 2349 08/27/21 0406 08/27/21 0802  GLUCAP 102* 99 103*    Controlled 6/14 but has run low likely due to lack of J tube feeds 11. Amyloidosis: chronic right CN VI palsy 12. ESBL Kleb/Citrobacter: Asymptomatic bacteruria being monitored.  13.  Leucocytosis: Resolved see below  14. Prostate cancer s/p XRT: On hormone replacement therapy  15. Iron deficiency/ABLA: On iron supplement. HGB 8.0 Monitor     Latest Ref Rng & Units 08/24/2021    6:03 AM 08/20/2021    5:51 AM 08/19/2021    3:23 AM  CBC  WBC 4.0 - 10.5 K/uL 6.0  9.6  9.7   Hemoglobin 13.0 - 17.0 g/dL 8.2  8.6  8.0   Hematocrit 39.0 - 52.0 % 25.9  27.2  25.7   Platelets 150 - 400 K/uL 539  615  585     6/12 Hgb stable   14. Tachycardia: On low dose BB.  Noted to have frequent PVCs. He had prolonged QT that resolved with electrolyte repletion. Vitals:   08/27/21 0408 08/27/21 0857  BP: (!) 111/58 103/62  Pulse: 89 79  Resp: 16 18  Temp: (!) 97.5 F (36.4 C) 98.4 F (36.9 C)  SpO2: 97% 98%    15. Hiccups: resolved off  gabapentin . 16. Multiple myeloma. Ixazomib was noted on d/c meds from [name], 21m Dexamethazone weekly, held during recent admission 17. RIght Parotid gland lesion increasing in size since 2019 . Will need ENT f/u, pt states he was unaware of this finding  18. Orthostatic and Essential Tremor. BB may be providing benefit.  19.  Last recorded BM 6/13  LOS: 8 days [place] Mas[place] Lubin 08/27/2021, 9:00 AM

## 2021-08-27 NOTE — Progress Notes (Signed)
Occupational Therapy Session Note  Patient Details  Name: William Barron MRN: [id number] Date of Birth: 04-Jun-1945  Today's Date: 08/27/2021 OT Individual Time: 1400-1507 OT Individual Time Calculation (min): 67 min  23 min missed d/t fatigue   Short Term Goals: Week 2:  OT Short Term Goal 1 (Week 2): LTG=STG  Skilled Therapeutic Interventions/Progress Updates:  Pt greeted seated in w/c, eager to speak to dietician now that wife present however pt was agreeable to go on with OT intervention. Pt transported to gym in w/c with total A where pt completed various therapeutic activities focused on dynamic balance, BLE strength and increasing overall strength and endurance for higher level ADLs. Pt completed below activities with overall CGA:                - x10 sit>stands with no UE support from EOM - x10 standing squats holding onto railing - ~ 10 ft functional ambulation with RW stepping over cones -passing weighted ball around back in standing x20 reps to challenge dynamic balance -side stepping over cones with UE support on railing to increase hip strenght -x10 standing hip ABD/ADD -x10 calf raises standing at railing -dynamic reaching out of BOS standing on airex cushion with UE support -x10 bicep curls 4 lb weighted ball in standing -x10 chest presses with 4 lb weighted ball in standing - ~ 30 ft of functional ambulation with Rw and CGA  Pt then reports fatigue asking to return to room, pt left seated in w/c with wife present and dietician entering to have meeting with pt and wife.  Therapy Documentation Precautions:  Precautions Precautions: Fall, Other (comment) Precaution Comments: GJ tube, orthostatic tremors, orthostatic vitals, clear liquids only Restrictions Weight Bearing Restrictions: No  Pain: unrated soreness reports in low back, rest breaks provided as needed.  Therapy/Group: Individual Therapy  [name] 08/27/2021, 3:37 PM

## 2021-08-27 NOTE — Progress Notes (Signed)
Patient ID: William Barron, male   DOB: 1945-10-12, 76 y.o.   MRN: [id number]  Team Conference Report to Patient/Family  Team Conference discussion was reviewed with the patient and caregiver, including goals, any changes in plan of care and target discharge date.  Patient and caregiver express understanding and are in agreement.  The patient has a target discharge date of 09/01/21.  SW met with patient and spouse on 6/14 to discuss alternative nursing options for discharge. Spouse has reported to SW and staff that she is uncomfortable with assisting with any nursing care for patient at discharge. Patient and spouse are requesting nursing home placement to address nursing issues vs. d/c home. Sw informed both of the current level patient is at physically and informed them the recommendation is not for SNF. Due to patient current goal level SW cannot guarantee bed placement or guarantee how many days patient will remain at SNF. SW also discussed with patient and family the option to hire assistance in the home. Sw also informed patient and spouse that [facility] will send out nursing services a few times a week but not daily or 24/7. Spouse informed the [facility] resources provided are to fill in the gaps or when she needs to leave the home.   SW received call phone patient son, William Barron. Sw introduced self and explained role. Patient son concerned about patient returning home with his step mother due to not feeling comfortable with nursing tasks. Patient son reports that he is out of town but plans to return to assist his step mother with assisting his father and preparing patients home. Patient son requesting to have patient discharged to SNF to allow him more time to get into town. Sw informed patient son and spouse that patient referral has been sent but they will need to identify a plan beyond SNF and begin to establish 24/7 assistance inside the home and their plan for set up once patient returns. Sw will provide  patient son and spouse an update on the SNF referral once received.  [phone]  Patient received a phone call from patient and spouse friend, William Barron. Patient friend requesting discharging information and the plan for patient. Patient friend and spouse have medical questions they would like to address with physician. Sw notified the physician to give patient spouse a call. No additional questions or concerns.   [place] 08/27/2021, 1:04 PM

## 2021-08-27 NOTE — NC FL2 (Signed)
[place] LEVEL OF CARE SCREENING TOOL     IDENTIFICATION  Patient Name: William Barron Birthdate: [birth year] Sex: male Admission Date (Current Location): 08/19/2021  [facility] and [place] Number:  Herbalist and Address:  [facility], [place] [address], [place], [place] [address]      Provider Number:    Attending Physician Name and Address:  Charlett Blake, MD  Relative Name and Phone Number:  [name] [phone]    Current Level of Care: Hospital Recommended Level of Care: [place] Prior Approval Number:    Date Approved/Denied:   PASRR Number: [id number]  Discharge Plan: Home    Current Diagnoses: Patient Active Problem List   Diagnosis Date Noted   Malnutrition of moderate degree 08/19/2021   Debility 08/19/2021   Jejunostomy tube present ([place])    Asymptomatic bacteriuria 08/15/2021   H/O Whipple procedure 08/13/2021   Intractable hiccups 08/13/2021   Candida esophagitis ([place]) 08/13/2021   Diabetes ([place]) 08/13/2021   Anemia 08/13/2021   Hypoalbuminemia 08/13/2021   Frequent PVCs 08/13/2021   Prolonged QT interval 08/13/2021   Facial droop 08/13/2021   Hormone replacement therapy 08/13/2021   Lesion of parotid gland 08/13/2021   Protein-calorie malnutrition, severe ([place]) 08/13/2021   Feeding difficulties 08/12/2021   Feeding tube dysfunction, initial encounter 08/12/2021   Posterior tibial tendon dysfunction, right 04/15/2021   Posterior tibial tendinitis, right leg    Prostate cancer ([place]) 02/11/2020   Orthostatic tremor 10/01/2019   Educated about COVID-19 virus infection 09/12/2019   Dyslipidemia 09/12/2019   Nonrheumatic pulmonary valve insufficiency 09/12/2019   Red blood cell antibody positive, compatible PRBC difficult to obtain 02/15/2018   Diplopia 01/03/2018   Benign essential tremor 01/03/2018   Pain in joint of right ankle 05/17/2017   Foot pain 05/17/2017   Peroneal  tendinitis 05/17/2017   Depression with anxiety 01/03/2017   Insomnia 05/05/2016   Coronary artery calcification seen on CAT scan    DOE (dyspnea on exertion)    Fatigue 02/27/2015   Elevated homocysteine 09/26/2014   Multiple myeloma ([place]) 09/26/2014   Acquired pes planus of right foot 07/02/2014   Posterior tibial tendon dysfunction 07/02/2014   Encounter for antineoplastic chemotherapy 01/02/2014   Kahler disease ([place]) 01/02/2014   Avitaminosis D 01/02/2014   Amyloidosis ([place]) 03/26/2013   Enlarged lymph node 02/14/2013   Pure hypercholesterolemia 03/29/2011   Memory disorder 09/21/2010   Labile hypertension 09/21/2010    Orientation RESPIRATION BLADDER Height & Weight     Self, Time, Situation, Place  Normal Continent Weight: 127 lb 13.9 oz (58 kg) Height:  _0  (165.1 cm)  BEHAVIORAL SYMPTOMS/MOOD NEUROLOGICAL BOWEL NUTRITION STATUS      Continent    AMBULATORY STATUS COMMUNICATION OF NEEDS Skin   Limited Assist Verbally Normal                       Personal Care Assistance Level of Assistance  Bathing, Feeding, Dressing, Total care Bathing Assistance: Limited assistance Feeding assistance: Limited assistance Dressing Assistance: Limited assistance Total Care Assistance: Limited assistance   Functional Limitations Info    Sight Info: Adequate Hearing Info: Adequate Speech Info: Adequate    SPECIAL CARE FACTORS FREQUENCY  PT (By licensed PT), OT (By licensed OT), Speech therapy     PT Frequency: 5x a week OT Frequency: 5x a week     Speech Therapy Frequency: 5x week      Contractures  Additional Factors Info    Code Status Info: [place] Allergies Info: Penicillins       Suctioning Needs: GJ tube   Current Medications (08/27/2021):  This is the current hospital active medication list Current Facility-Administered Medications  Medication Dose Route Frequency Provider Last Rate Last Admin   acetaminophen (TYLENOL) tablet 325-650 mg  325-650  mg Per Tube Q4H PRN Love, Pamela S, PA-C       alum & mag hydroxide-simeth (MAALOX/MYLANTA) 200-200-20 MG/5ML suspension 30 mL  30 mL Per Tube Q4H PRN Love, Pamela S, PA-C       bisacodyl (DULCOLAX) suppository 10 mg  10 mg Rectal Daily PRN Love, Pamela S, PA-C       diphenhydrAMINE (BENADRYL) 12.5 MG/5ML elixir 12.5-25 mg  12.5-25 mg Per Tube Q6H PRN Kirsteins, Luanna Salk, MD       enoxaparin (LOVENOX) injection 40 mg  40 mg Subcutaneous Q24H Love, Pamela S, PA-C   40 mg at 08/26/21 2057   feeding supplement (GLUCERNA 1.5 CAL) liquid 1,000 mL  1,000 mL Per Tube Continuous Charlett Blake, MD 75 mL/hr at 08/23/21 1419 1,000 mL at 08/23/21 1419   ferrous sulfate 220 (44 Fe) MG/5ML solution 220 mg  220 mg Per Tube Len Childs, PA-C   220 mg at 08/24/21 1217   free water 150 mL  150 mL Per Tube Q4H Meredith Staggers, MD   150 mL at 08/25/21 1217   guaiFENesin-dextromethorphan (ROBITUSSIN DM) 100-10 MG/5ML syrup 5-10 mL  5-10 mL Per Tube Q6H PRN Love, Pamela S, PA-C       insulin aspart (novoLOG) injection 0-9 Units  0-9 Units Subcutaneous Q4H Bary Leriche, PA-C   1 Units at 08/26/21 2057   MEDLINE mouth rinse  15 mL Mouth Rinse q12n4p Bary Leriche, PA-C   15 mL at 08/26/21 1643   megestrol (MEGACE) 400 MG/10ML suspension 400 mg  400 mg Per Tube BID Kirsteins, Luanna Salk, MD       melatonin tablet 6 mg  6 mg Per Tube QHS Love, Pamela S, PA-C   6 mg at 08/21/21 2324   oxyCODONE (Oxy IR/ROXICODONE) immediate release tablet 2.5 mg  2.5 mg Per Tube Q4H PRN Love, Pamela S, PA-C       pantoprazole sodium (PROTONIX) 40 mg/20 mL oral suspension 40 mg  40 mg Per Tube Daily Bary Leriche, PA-C   40 mg at 08/25/21 0806   phenol (CHLORASEPTIC) mouth spray 1 spray  1 spray Mouth/Throat PRN Love, Pamela S, PA-C       polyethylene glycol (MIRALAX / GLYCOLAX) packet 17 g  17 g Per Tube Daily PRN Kirsteins, Luanna Salk, MD       prochlorperazine (COMPAZINE) tablet 5-10 mg  5-10 mg Per Tube Q6H PRN Love,  Pamela S, PA-C       Or   prochlorperazine (COMPAZINE) injection 5-10 mg  5-10 mg Intramuscular Q6H PRN Love, Pamela S, PA-C       Or   prochlorperazine (COMPAZINE) suppository 12.5 mg  12.5 mg Rectal Q6H PRN Love, Pamela S, PA-C       sodium phosphate (FLEET) 7-19 GM/118ML enema 1 enema  1 enema Rectal Once PRN Love, Pamela S, PA-C       testosterone (ANDROGEL) 50 MG/5GM (1%) gel 10 g  10 g Transdermal Daily Charlett Blake, MD   10 g at 08/26/21 6468     Discharge Medications: Please see discharge summary for a list  of discharge medications.  Relevant Imaging Results:  Relevant Lab Results:   Additional Information [id number]. Vaccinated with booster  Dyanne Iha

## 2021-08-27 NOTE — Evaluation (Signed)
Speech Language Pathology Assessment Only  Patient Details  Name: William Barron MRN: [id number] Date of Birth: 04/16/1945  Today's Date: 08/27/2021 SLP Individual Time: 1016-1110 SLP Individual Time Calculation (min): 87 min  Hospital Problem: Principal Problem:   Debility  Past Medical History:  Past Medical History:  Diagnosis Date   Amyloidosis ([place])    Diabetes mellitus without complication ([place])    GERD (gastroesophageal reflux disease)    Hearing loss    Has hearing aids   History of kidney stones    Hyperlipidemia    Multiple myeloma ([place])    and amylodosis    Nephrolithiasis    Occasional tremors    Pneumonia    Prostate cancer ([place])    Prostatitis    acute and chronic   Skin cancer    Past Surgical History:  Past Surgical History:  Procedure Laterality Date   APPENDECTOMY     CARDIAC CATHETERIZATION N/A 06/13/2015   Procedure: Left Heart Cath and Coronary Angiography;  Surgeon: Jettie Booze, MD;  Location: [place] [facility];  Service: Cardiovascular;  Laterality: N/A;   COLONOSCOPY  04/07/2000   ELBOW SURGERY  03/15/2001   right   EXTRACORPOREAL SHOCK WAVE LITHOTRIPSY Left 07/17/2018   Procedure: EXTRACORPOREAL SHOCK WAVE LITHOTRIPSY (ESWL);  Surgeon: [name], MD;  Location: [facility];  Service: Urology;  Laterality: Left;   EXTRACORPOREAL SHOCK WAVE LITHOTRIPSY Right 12/08/2020   Procedure: RIGHT EXTRACORPOREAL SHOCK WAVE LITHOTRIPSY (ESWL);  Surgeon: Raynelle Bring, MD;  Location: [facility];  Service: Urology;  Laterality: Right;   FOOT ARTHRODESIS Right 04/15/2021   Procedure: RIGHT SUBTALAR AND TALONAVICULAR FUSION;  Surgeon: Newt Minion, MD;  Location: [place];  Service: Orthopedics;  Laterality: Right;   HERNIA REPAIR     ROTATOR CUFF REPAIR     WHIPPLE PROCEDURE  07/13/2021    Assessment / Plan / Recommendation Clinical Impression  Patient is a 76 y.o. year old male with history of amyloidosis, prostate CA s/p XRT,  T2DM, CAD, pancreatic mass (IPMN) s/p Whipple with J/G tube at [facility] complicated by delirium, UTI, candidal esophagitis and need for continuous suction via G tube due to intractable hiccups?.  He was discharged to SNF [facility] who was unable to provide care needed (due to G tube used for continuous suction) and sent back to ED. [facility] had no beds so he was admitted to [facility] 08/12/21 for management and reports of increase in abdominal pain, hiccups and cough.  Thorazine changed to gabapentin and G port hooked to suction prn distension/hiccups and started on clears for comfort.  He is tolerating continuous tube feeds via J tube. MRI brain done due to reports of facial droop/confusion on 06/03 and was negative for acute changes. Son reports his facial expression appears unchanged from prior. PT/OT working with patient and he continues to be limited by fatigue, ataxic gait and SOB due to anxiety/tendency to hold his breath. CIR recommended due to functional decline. He reports history of chronic essential tremor and orthostatic tremor. He has difficulty standing but less trouble with movement and ambulation due to this tremor. Patient transferred to CIR on 08/19/2021  Pt presents with a SLUMS score of 30/30 indicating WFL cognition for age and function in areas of orientation, recall, problem solving, working Marine scientist and executive function. Pt informally observed to recall specific details of treating therapists and ability to learn new information (I.e. new PO diet, ordering from menu, precautions, etc). Pt able to functionally problem solve use  of cell phone (was speaking with Medicare representative upon SLP arrival). Pt reports feeling at baseline function for cognition and emergent awareness of current physical impairments. Of note, this session was completed in a quiet, controlled environment and other treating therapists have reported some deficits in awareness/memory in their sessions. Pt is discharging home with  wife vs. SNF, either will provided required assist/supervision for cognition at this time. OP/HHST warranted if pt and family noting decline at home as a result of prolonged hospitalization, however no skilled ST recommended during CIR due to apparent baseline function.    Skilled Therapeutic Interventions          Pt participating in assessment of cognitive communication function, please see above.  SLP Assessment  Patient does not need any further Speech [place] Pathology Services    Recommendations  Patient destination: Home Follow up Recommendations: Outpatient SLP;Home Health SLP (if warranted) Equipment Recommended: None recommended by SLP          Pain Pain Assessment Pain Scale: 0-10 Pain Score: 0-No pain  Prior Functioning Cognitive/Linguistic Baseline: Information not available Type of Home: House  Lives With: Spouse Available Help at Discharge: Family;Available 24 hours/day  SLP Evaluation Cognition Overall Cognitive Status: Within Functional Limits for tasks assessed Arousal/Alertness: Awake/alert Orientation Level: Oriented X4 Year: 2023 Month: June Day of Week: Correct Attention: Selective;Sustained Focused Attention: Appears intact Sustained Attention: Appears intact Selective Attention: Appears intact Memory: Appears intact Awareness: Appears intact Problem Solving: Appears intact Safety/Judgment: Appears intact  Comprehension Auditory Comprehension Overall Auditory Comprehension: Appears within functional limits for tasks assessed Yes/No Questions: Within Functional Limits Commands: Within Functional Limits Conversation: Complex Expression Expression Primary Mode of Expression: Verbal Verbal Expression Overall Verbal Expression: Appears within functional limits for tasks assessed Oral Motor Oral Motor/Sensory Function Overall Oral Motor/Sensory Function: Within functional limits Motor Speech Overall Motor Speech: Appears within functional  limits for tasks assessed  Care Tool Care Tool Cognition Ability to hear (with hearing aid or hearing appliances if normally used Ability to hear (with hearing aid or hearing appliances if normally used): 0. Adequate - no difficulty in normal conservation, social interaction, listening to TV   Expression of Ideas and Wants Expression of Ideas and Wants: 4. Without difficulty (complex and basic) - expresses complex messages without difficulty and with speech that is clear and easy to understand   Understanding Verbal and Non-Verbal Content Understanding Verbal and Non-Verbal Content: 3. Usually understands - understands most conversations, but misses some part/intent of message. Requires cues at times to understand  Memory/Recall Ability Memory/Recall Ability : Current season;Staff names and faces;That he or she is in a hospital/hospital unit;Location of own room    Discharge Criteria: Patient will be discharged from SLP if patient refuses treatment 3 consecutive times without medical reason, if treatment goals not met, if there is a change in medical status, if patient makes no progress towards goals or if patient is discharged from hospital.  The above assessment were discussed and mutually agreed upon: by patient  Dewaine Conger 08/27/2021, 11:58 AM

## 2021-08-28 LAB — GLUCOSE, CAPILLARY
Glucose-Capillary: 95 mg/dL (ref 70–99)
Glucose-Capillary: 208 mg/dL — ABNORMAL HIGH (ref 70–99)
Glucose-Capillary: 165 mg/dL — ABNORMAL HIGH (ref 70–99)
Glucose-Capillary: 135 mg/dL — ABNORMAL HIGH (ref 70–99)
Glucose-Capillary: 157 mg/dL — ABNORMAL HIGH (ref 70–99)

## 2021-08-28 MED ORDER — CHLORPROMAZINE HCL 10 MG PO TABS
10.0000 mg | ORAL_TABLET | Freq: Three times a day (TID) | ORAL | Status: DC | PRN
Start: 2021-08-28 — End: 2021-08-31
  Administered 2021-08-31 (×2): 10 mg via ORAL
  Filled 2021-08-28 (×3): qty 1

## 2021-08-28 NOTE — Progress Notes (Signed)
PROGRESS NOTE   Subjective/Complaints:  G tube clamping x 24h well tolerated Poor intake persists, J tube feeds adjusted to overnite x 10h, now on Megace   ROS: Patient denies CP, SOB, N/V/D Objective:   IR Mech Remov Obstruc Mat Any Colon Tube W/Fluoro  Result Date: 08/27/2021 INDICATION: Clogged gastrojejunostomy tube EXAM: Gastrojejunostomy tube check and clearance of mechanical obstruction using fluoroscopy MEDICATIONS: None ANESTHESIA/SEDATION: None CONTRAST:  20 mL-administered into the gastric lumen. FLUOROSCOPY: Radiation Exposure Index (as provided by the fluoroscopic device): 1.9 minutes (3 mGy) COMPLICATIONS: None immediate. PROCEDURE: Informed written consent was obtained from the patient after a thorough discussion of the procedural risks, benefits and alternatives. All questions were addressed. Maximal Sterile Barrier Technique was utilized including caps, mask, sterile gowns, sterile gloves, sterile drape, hand hygiene and skin antiseptic. A timeout was performed prior to the initiation of the procedure. The patient was placed supine on the exam table. The mid abdomen was prepped and draped in a standard sterile fashion with inclusion of the existing gastrojejunostomy tube within the sterile field. A Christmas tree adapter was attached to the jejunostomy port and forceful injection of sterile saline cleared the mechanical obstruction. Patency and appropriate location of the jejunostomy and of the catheter was confirmed with injection of contrast material, was confirmed a patent gastrojejunostomy tube with location within the small bowel. The patient tolerated the procedure well without immediate complication. IMPRESSION: Successful clearance of the mechanical obstruction of the jejunostomy port of the gastrojejunostomy tube. Recommend continued flushing at least 3 times daily and after meals and medication administration to prevent  future occurrences. Electronically Signed   By: Albin Felling M.D.   On: 08/27/2021 14:47   No results for input(s): "WBC", "HGB", "HCT", "PLT" in the last 72 hours.   No results for input(s): "NA", "K", "CL", "CO2", "GLUCOSE", "BUN", "CREATININE", "CALCIUM" in the last 72 hours.    Intake/Output Summary (Last 24 hours) at 08/28/2021 0943 Last data filed at 08/27/2021 2210 Gross per 24 hour  Intake 250 ml  Output 100 ml  Net 150 ml         Physical Exam: Vital Signs Blood pressure 115/67, pulse 85, temperature 98.3 F (36.8 C), resp. rate 18, height 5' 5" (1.651 m), weight 58.4 kg, SpO2 97 %.    General: No acute distress Mood and affect are appropriate Heart: Regular rate and rhythm no rubs murmurs or extra sounds Lungs: Clear to auscultation, breathing unlabored, no rales or wheezes Abdomen: Positive bowel sounds, soft nontender to palpation, nondistended, GJ site CDI Extremities: No clubbing, cyanosis, or edema Skin: No evidence of breakdown, no evidence of rash   Ext: no clubbing, cyanosis, or edema Psych: pleasant and cooperative  Skin: No evidence of breakdown, no evidence of rash. Left forearm wrapped Neurologic: Cranial nerves II through XII intact, motor strength is 4+/5 in bilateral deltoid, bicep, tricep, grip, hip flexor, knee extensors, ankle dorsiflexor and plantar flexor RIght CN 6 still present. HOH Musculoskeletal: Full range of motion in all 4 extremities. No joint swelling   Assessment/Plan: 1. Functional deficits which require 3+ hours per day of interdisciplinary therapy in a comprehensive inpatient rehab setting. Physiatrist is providing  close team supervision and 24 hour management of active medical problems listed below. Physiatrist and rehab team continue to assess barriers to discharge/monitor patient progress toward functional and medical goals  Care Tool:  Bathing    Body parts bathed by patient: Right arm, Left arm, Chest, Abdomen, Front  perineal area, Buttocks, Right upper leg, Left upper leg, Right lower leg, Left lower leg   Body parts bathed by helper: Buttocks     Bathing assist Assist Level: Contact Guard/Touching assist     Upper Body Dressing/Undressing Upper body dressing   What is the patient wearing?: Pull over shirt    Upper body assist Assist Level: Independent with assistive device    Lower Body Dressing/Undressing Lower body dressing      What is the patient wearing?: Underwear/pull up, Pants     Lower body assist Assist for lower body dressing: Independent with assitive device     Toileting Toileting    Toileting assist Assist for toileting: Contact Guard/Touching assist     Transfers Chair/bed transfer  Transfers assist     Chair/bed transfer assist level: Contact Guard/Touching assist Chair/bed transfer assistive device: Armrests   Locomotion Ambulation   Ambulation assist      Assist level: Contact Guard/Touching assist Assistive device: Walker-rolling Max distance: 90   Walk 10 feet activity   Assist     Assist level: Contact Guard/Touching assist Assistive device: Walker-rolling   Walk 50 feet activity   Assist Walk 50 feet with 2 turns activity did not occur: Safety/medical concerns  Assist level: Contact Guard/Touching assist Assistive device: Walker-rolling    Walk 150 feet activity   Assist Walk 150 feet activity did not occur: Safety/medical concerns         Walk 10 feet on uneven surface  activity   Assist Walk 10 feet on uneven surfaces activity did not occur: Safety/medical concerns         Wheelchair     Assist Is the patient using a wheelchair?: Yes Type of Wheelchair: Manual    Wheelchair assist level: Supervision/Verbal cueing Max wheelchair distance: 100    Wheelchair 50 feet with 2 turns activity    Assist        Assist Level: Supervision/Verbal cueing   Wheelchair 150 feet activity     Assist       Assist Level: Dependent - Patient 0%   Blood pressure 115/67, pulse 85, temperature 98.3 F (36.8 C), resp. rate 18, height _0  (1.651 m), weight 58.4 kg, SpO2 97 %.  Medical Problem List and Plan: 1. Functional deficits secondary to Debility s/p Whipple procedure 07/13/21             -patient may shower cover J tube             -ELOS/Goals: 7-10 days -Continue CIR therapies including PT, OT   2.  Antithrombotics: -DVT/anticoagulation:  Pharmaceutical: Lovenox             -antiplatelet therapy: N/a 3. Pain Management:  oxycodone prn.  4. Mood: LCSW to follow for evaluation and support.              -antipsychotic agents: N/A 5. Neuropsych: This patient is capable of making decisions on his own behalf. 6. Skin/Wound Care: Monitor wound for healing.  7. Fluids/Electrolytes/Nutrition: Monitor I/O.   NPO until [place] replaced then ok to resume ful liquid today , advance to soft diet in am  8.Whipple for IPMN (Dr. Hyman Hopes): On tube feeds via  J tube.             -Clear liquids tolerated Tolerating q24 h clamp    9. Candidal esophagitis: On Fluconazole X 10 days-->>ended 06/09  10. T2DM: Hgb A1C-             --continue Semglee 6 units with novolog prn every 4 hours          CBG (last 3)  Recent Labs    08/27/21 2348 08/28/21 0348 08/28/21 0809  GLUCAP 155* 95 208*    Controlled 6/14 but has run low likely due to lack of J tube feeds 11. Amyloidosis: chronic right CN VI palsy 12. ESBL Kleb/Citrobacter: Asymptomatic bacteruria being monitored.  13.  Leucocytosis: Resolved see below  14. Prostate cancer s/p XRT: On hormone replacement therapy  15. Iron deficiency/ABLA: On iron supplement. HGB 8.0 Monitor     Latest Ref Rng & Units 08/24/2021    6:03 AM 08/20/2021    5:51 AM 08/19/2021    3:23 AM  CBC  WBC 4.0 - 10.5 K/uL 6.0  9.6  9.7   Hemoglobin 13.0 - 17.0 g/dL 8.2  8.6  8.0   Hematocrit 39.0 - 52.0 % 25.9  27.2  25.7   Platelets 150 - 400 K/uL 539  615  585     6/12 Hgb  stable   14. Tachycardia: On low dose BB.  Noted to have frequent PVCs. He had prolonged QT that resolved with electrolyte repletion. Vitals:   08/27/21 2007 08/28/21 0357  BP: 107/63 115/67  Pulse: 79 85  Resp: 18 18  Temp: 98.2 F (36.8 C) 98.3 F (36.8 C)  SpO2: 99% 97%   HR and BP ok off BB 15. Hiccups: resolved off  gabapentin . 16. Multiple myeloma. Ixazomib was noted on d/c meds from [name], 36m Dexamethazone weekly, held during recent admission 17. RIght Parotid gland lesion increasing in size since 2019 . Will need ENT f/u, pt states he was unaware of this finding    18.  Last recorded BM 6/16 19.  Dispo wife uncomfortable with TF, even with RN coming into home to assist, FL2 completed for SNF, will explore LTACH options LOS: 9 days A FACE TO FACE EVALUATION WAS PERFORMED  ACharlett Blake6/16/2023, 9:43 AM

## 2021-08-28 NOTE — Progress Notes (Signed)
Occupational Therapy Session Note  Patient Details  Name: William Barron MRN: [id number] Date of Birth: 1945/12/26  Today's Date: 08/28/2021 OT Individual Time: 0802-0917 OT Individual Time Calculation (min): 75 min    Short Term Goals: Week 2:  OT Short Term Goal 1 (Week 2): LTG=STG  Skilled Therapeutic Interventions/Progress Updates:  Pt greeted seated in w/c with LPN present providing meds, pt reports good evening and agreeable to OT intervention. Pts tray had just arrived and even though pt reports not being hungry pt wants to try to eat.  Noted mild difficulty with self feeding d/t tremors, offered suggestion of weighted utensils to assist with tremors however pt reports using weighted golf club at one time but reports little success.  Pt reports increased weakness this AM requesting to roll into the bathroom in w/c, MIN A for w/c mobility to get over the threshold, pt completed stand pivot to toilet with grab bars and CGA. + BM pt able to completer pericare with set- up assist using grab bar for balance, ambulatory transfer to walkin shower with no AD holding onto shower wall with CGA. Pt completed bathing from shower seat with overall supervision. Pt requested to dress from shower seat, MOD I for UB dressing, CGA for LB dressing today as unstedy standing from shower seat, recommend pt LB dress from more stable seating.  Pt completed stand pivot from shower seat to w/c with CGA. Pt completed seated grooming at sink MOD I.  Pt left seated in w/c with all needs within reach and chair alarm activated.   Therapy Documentation Precautions:  Precautions Precautions: Fall, Other (comment) Precaution Comments: GJ tube, orthostatic tremors, orthostatic vitals, clear liquids only Restrictions Weight Bearing Restrictions: No  Pain: no pain reported during session   Therapy/Group: Individual Therapy  Corinne Ports [facility] 08/28/2021, 12:04 PM

## 2021-08-28 NOTE — Progress Notes (Addendum)
Physical Therapy Weekly Progress Note  Patient Details  Name: HRISHIKESH HOEG MRN: [id number] Date of Birth: 17-Apr-1945  Beginning of progress report period: August 20, 2021 End of progress report period: August 28, 2021  Today's Date: 08/28/2021 PT Individual Time: 2957-4734 and 1102-1200 PT Individual Time Calculation (min): 48 min and 58 min  Missed time 27 min 2/2 inability to participate.  Patient has met 4 of 5 short term goals.  Pt performing bed mobility w/ supervision.  Pt transfers w/ CGA sit to stand, as well as bed<> chair.  Pt amb up to 120' w/ RW and CGA, verbal cues for BOS and posture.  Pt  has negotiated 4 steps w/ B rails and min A previously and has performed w/ 1 rail on R and only 3 steps as states unable to continue w/ steps 2/2 constant hiccups.  Pt required only min A to perform w/ R hand rail to simulate home environment.    Patient continues to demonstrate the following deficits muscle weakness and decreased standing balance and therefore will continue to benefit from skilled PT intervention to increase functional independence with mobility.  Patient progressing toward long term goals.  Continue plan of care.  Pt will be D/C'd to SNF for FT care when bed available, possibly meeting D/C date of 08/31/21  PT Short Term Goals Week 2:  PT Short Term Goal 1 (Week 2): Pt will negotiate 4 steps w/ R hand rail only w/ CGA  Skilled Therapeutic Interventions/Progress Updates:   First session:  Pt presents sitting in w/c and agreeable to therapy.  PT assist w/ donning THT w/ min A, stood w/ CGA to manage pants and don clean shorts.  Pt wheeled self x 100 using B UE s and LE s.  Pt wheeled the rest of way to small gym.  Pt transferred w/c to Nu-step w/ CGA.  Pt would attempt to turn full way around w/o verbal cues.  Pt performed Level 4 for 5' x 2 w/ rest break between 2/2 fatigue.  Pt amb multiple trials up to 120' w/ CGA and RW, verbal cues for improving BOS.  Pt returned to room  and remained sitting in w/c w/ seat alarm on and all needs in reach.  Second session:  Pt presents sitting in w/c and reluctantly agreeable to participate in therapy 2/2 c/o constant hiccuping.  Pt wheeled self out of room w/ B UE s and LE s to elevators.  PT wheeled outside to [facility] for change in scenery.  Pt performed LE there ex calf raises, LAQ, hip flexion 3 x 10.  Pt wheeled across uneven surfaces to bench.  Pt performed squat pivot transfer w/c <> bench w/ CGA and cueing.  Pt stating that unable to continue w/ therapy, requesting to return to speak w/ nursing re: meds.  Pt returned to room and remained sitting in w/c w/ seat alarm on, all needs in reach.     Therapy Documentation Precautions:  Precautions Precautions: Fall, Other (comment) Precaution Comments: GJ tube, orthostatic tremors, orthostatic vitals, clear liquids only Restrictions Weight Bearing Restrictions: No General: PT Amount of Missed Time (min): 27 Minutes PT Missed Treatment Reason: Other (Comment);Patient unwilling to participate (unable to participate 2/2 constant hiccups.) Vital Signs: Therapy Vitals Temp: 98 F (36.7 C) Pulse Rate: 77 Resp: 17 BP: 126/62 Patient Position (if appropriate): Sitting Oxygen Therapy SpO2: 100 % O2 Device: Room Air Pain:no c/o pain, hicups only. Pain Assessment Pain Scale: 0-10 Pain Score: 0-No pain  Vision/Perception       Therapy/Group: Individual Therapy  [name] 08/28/2021, 2:06 PM

## 2021-08-28 NOTE — Progress Notes (Addendum)
Patient ID: William Barron, male   DOB: March 19, 1945, 76 y.o.   MRN: [id number]  SW has been provided with patient and family preference of [facility]. AD reports she is unable to determine if a bed will be available on Monday. Sw will follow up Monday.    Patient and spouse have informed there will not be any updates to report until Monday. SW will reach out to patient and family.

## 2021-08-29 LAB — GLUCOSE, CAPILLARY
Glucose-Capillary: 104 mg/dL — ABNORMAL HIGH (ref 70–99)
Glucose-Capillary: 130 mg/dL — ABNORMAL HIGH (ref 70–99)
Glucose-Capillary: 161 mg/dL — ABNORMAL HIGH (ref 70–99)
Glucose-Capillary: 168 mg/dL — ABNORMAL HIGH (ref 70–99)
Glucose-Capillary: 169 mg/dL — ABNORMAL HIGH (ref 70–99)
Glucose-Capillary: 174 mg/dL — ABNORMAL HIGH (ref 70–99)
Glucose-Capillary: 178 mg/dL — ABNORMAL HIGH (ref 70–99)

## 2021-08-29 NOTE — Progress Notes (Signed)
Physical Therapy Session Note  Patient Details  Name: William Barron MRN: [id number] Date of Birth: 1945/09/13  Today's Date: 08/29/2021 PT Individual Time: 0807-0905 PT Individual Time Calculation (min): 58 min   Short Term Goals: Week 2:  PT Short Term Goal 1 (Week 2): Pt will negotiate 4 steps w/ R hand rail only w/ CGA  Skilled Therapeutic Interventions/Progress Updates:    Pt received supported long sitting in bed and agreeable to therapy session. Pt continues to report that he and his wife do not feel comfortable managing his J tube at home and per SW note are awaiting to hear about possible SNF placement with an update planned for Monday. Donned thigh high TED hose set-up assist. Long sitting>sitting EOB, HOB maximally elevated, with supervision and increased time/effort. Sitting EOB donned shorts and shoes set-up assist.   Sitting EOB vitals: BP 114/62 (MAP 75), HR 98bpm   Sit>stand EOB>RW with CGA and requiring increasing support while standing due to progressively worsening B LE tremors. Pt requires 2 standing attempts prior to being able to fully pull up his pants and fasten them because his tremors get worse while standing preventing him from being able to remain upright for >15-20seconds.  Pt requesting to brush teeth. Gait ~43f to sink using RW with CGA for steadying. Pt requesting to sit while performing ADLs at sink due to pt feeling his tremors are worse this AM. Completed ADLs sitting with set-up assist and visual cuing to locate items.  Transported to/from gym in w/c for time management and energy conservation.  Gait training 448fusing RW with CGA for steadying - excessive forward trunk rounding, shuffled gait with decreased B LE step lengths, and slow gait speed - mild B LE tremors - pt reports he is trying to decrease pressure down through AD while ambulating because this helps prevent the tremors from worsening quickly.  Pt requesting to sit stating he cannot go  further due to feeling tired. Pt states "I can't tell the difference between tired and lightheaded."   Vitals assessed in sitting after gait: BP 108/65 (MAP 80), HR 104bpm   Provided pt with water and educated on importance of increased intake.   Vitals in standing (pt unable to tolerate standing the full time for the machine to get a reading): BP 95/54 (MAP 68), HR 103bpm    Gait 2635fsing RW with continued primarily CGA for steadying/safety - pt continues to demo slow gait speed with decreased B LE step lengths and excessive forward trunk flexion. Pt continues to report fatigue and requesting to sit during gait training - vitals reassessed.  Vitals in sitting: BP 100/62 (MAP 74), HR 107bpm    Gait 20f63fing RW with continued CGA and gait deviations as stated above - with this longer distance pt demos heavy/labored breathing towards end of gait distance.  Reassessed vitals in sitting: BP 125/61 (MAP 80), HR 100bpm   Pt's wife arrived for end of session.  Gait training additional ~50ft41fards room using RW with CGA and therapist bringing w/c behind pt for safety.   Pt transported back to room. Pt's wife with questions regarding D/C disposition - therapist provided update per SW note that we are waiting until Monday to hear back from SNF.  Pt agreeable to remain up in w/c - left with needs in reach, chair alarm on, and his wife present.    Therapy Documentation Precautions:  Precautions Precautions: Fall, Other (comment) Precaution Comments: GJ tube, orthostatic tremors, orthostatic vitals  Restrictions Weight Bearing Restrictions: No   Pain: Denies pain during session.   Therapy/Group: Individual Therapy  Tawana Scale , PT, DPT, NCS, CSRS 08/29/2021, 7:49 AM

## 2021-08-29 NOTE — Progress Notes (Signed)
PROGRESS NOTE   Subjective/Complaints: Patient doing well, toleration tube feeds at night and clamping during the day.  Wife at bedside with a lot of questions about discharge dispo and teaching. Reassured that primary team will address on Monday.  Still has poor intake, J tube feeds at night x 10h, on megace.  ROS: Patient denies CP, SOB, N/V/D  Objective:   No results found. No results for input(s): "WBC", "HGB", "HCT", "PLT" in the last 72 hours.   No results for input(s): "NA", "K", "CL", "CO2", "GLUCOSE", "BUN", "CREATININE", "CALCIUM" in the last 72 hours.    Intake/Output Summary (Last 24 hours) at 08/29/2021 1626 Last data filed at 08/29/2021 1529 Gross per 24 hour  Intake 755 ml  Output 300 ml  Net 455 ml        Physical Exam: Vital Signs Blood pressure 110/62, pulse 84, temperature 97.7 F (36.5 C), temperature source Oral, resp. rate (!) 22, height '5\' 5"'  (1.651 m), weight 56.9 kg, SpO2 99 %.    General: No acute distress, mood and affect are appropriate Heart: Regular rate and rhythm without rubs murmurs or additional heart sounds Lungs: CTA bilaterally breathing is normal and unlabored, no rales or wheezes Abdomen; bowel bowel sounds positive x4, nontender to palpation, nondistended, JG site CDI Extremities: No clubbing, cyanosis or edema Skin: No evidence of breakdown or rash, left forearm wrapped Psych: Pleasant and cooperative Neurologic: Cranial nerves II through XII intact, motor strength is 4+/5 in bilateral deltoid, bicep, tricep, grip, hip flexor, knee extensors, ankle dorsiflexor, and plantar flexor Right CN VI is still present.  HOH Musculoskeletal: FROM in all 4 extremities.  No joint swelling   Assessment/Plan: 1. Functional deficits which require 3+ hours per day of interdisciplinary therapy in a comprehensive inpatient rehab setting. Physiatrist is providing close team supervision and 24  hour management of active medical problems listed below. Physiatrist and rehab team continue to assess barriers to discharge/monitor patient progress toward functional and medical goals  Care Tool:  Bathing    Body parts bathed by patient: Right arm, Left arm, Chest, Abdomen, Front perineal area, Buttocks, Right upper leg, Left upper leg, Right lower leg, Left lower leg, Face   Body parts bathed by helper: Buttocks     Bathing assist Assist Level: Supervision/Verbal cueing     Upper Body Dressing/Undressing Upper body dressing   What is the patient wearing?: Pull over shirt    Upper body assist Assist Level: Independent with assistive device    Lower Body Dressing/Undressing Lower body dressing      What is the patient wearing?: Underwear/pull up, Pants     Lower body assist Assist for lower body dressing: Contact Guard/Touching assist     Toileting Toileting    Toileting assist Assist for toileting: Maximal Assistance - Patient 25 - 49%     Transfers Chair/bed transfer  Transfers assist     Chair/bed transfer assist level: Contact Guard/Touching assist Chair/bed transfer assistive device: Armrests   Locomotion Ambulation   Ambulation assist      Assist level: Contact Guard/Touching assist Assistive device: Walker-rolling Max distance: 120   Walk 10 feet activity   Assist  Assist level: Contact Guard/Touching assist Assistive device: Walker-rolling   Walk 50 feet activity   Assist Walk 50 feet with 2 turns activity did not occur: Safety/medical concerns  Assist level: Contact Guard/Touching assist Assistive device: Walker-rolling    Walk 150 feet activity   Assist Walk 150 feet activity did not occur: Safety/medical concerns         Walk 10 feet on uneven surface  activity   Assist Walk 10 feet on uneven surfaces activity did not occur: Safety/medical concerns         Wheelchair     Assist Is the patient using a  wheelchair?: Yes Type of Wheelchair: Manual    Wheelchair assist level: Supervision/Verbal cueing Max wheelchair distance: 100    Wheelchair 50 feet with 2 turns activity    Assist        Assist Level: Supervision/Verbal cueing   Wheelchair 150 feet activity     Assist      Assist Level: Dependent - Patient 0%   Blood pressure 110/62, pulse 84, temperature 97.7 F (36.5 C), temperature source Oral, resp. rate (!) 22, height '5\' 5"'  (1.651 m), weight 56.9 kg, SpO2 99 %.  Medical Problem List and Plan: 1. Functional deficits secondary to Debility s/p Whipple procedure 07/13/21             -patient may shower cover J tube             -ELOS/Goals: 7-10 days -Continue CIR therapies including PT, OT   2.  Antithrombotics: -DVT/anticoagulation:  Pharmaceutical: Lovenox             -antiplatelet therapy: N/a 3. Pain Management:  oxycodone prn.  4. Mood: LCSW to follow for evaluation and support.              -antipsychotic agents: N/A 5. Neuropsych: This patient is capable of making decisions on his own behalf. 6. Skin/Wound Care: Monitor wound for healing.  7. Fluids/Electrolytes/Nutrition: Monitor I/O.   NPO until [place] replaced then ok to resume ful liquid today , advance to soft diet in am  6/17 Patient reports doing well on soft diet 8.Whipple for IPMN (Dr. Hyman Hopes): On tube feeds via J tube.             -Clear liquids tolerated Tolerating q24 h clamp 6/17 tolerating clamp, for 24hrs with 30 min to suction   9. Candidal esophagitis: On Fluconazole X 10 days-->>ended 06/09  10. T2DM: Hgb A1C-             --continue Semglee 6 units with novolog prn every 4 hours          CBG (last 3)  Recent Labs    08/29/21 0433 08/29/21 0832 08/29/21 1143  GLUCAP 178* 104* 174*   Controlled 6/14 but has run low likely due to lack of J tube feeds 6/17 CBGs are controlled ranging from 104-178 with his overnight tube feeds 11. Amyloidosis: chronic right CN VI palsy 12. ESBL  Kleb/Citrobacter: Asymptomatic bacteruria being monitored.  13.  Leucocytosis: Resolved see below  14. Prostate cancer s/p XRT: On hormone replacement therapy  15. Iron deficiency/ABLA: On iron supplement. HGB 8.0 Monitor     Latest Ref Rng & Units 08/24/2021    6:03 AM 08/20/2021    5:51 AM 08/19/2021    3:23 AM  CBC  WBC 4.0 - 10.5 K/uL 6.0  9.6  9.7   Hemoglobin 13.0 - 17.0 g/dL 8.2  8.6  8.0   Hematocrit  39.0 - 52.0 % 25.9  27.2  25.7   Platelets 150 - 400 K/uL 539  615  585     6/12 Hgb stable  6/17 Hgb stable at 8.2  14. Tachycardia: On low dose BB.  Noted to have frequent PVCs. He had prolonged QT that resolved with electrolyte repletion. Vitals:   08/29/21 0434 08/29/21 1523  BP: (!) 103/49 110/62  Pulse: 87 84  Resp: 17 (!) 22  Temp: 98.4 F (36.9 C) 97.7 F (36.5 C)  SpO2: 99% 99%   HR and BP ok off BB 6/17 Okay BP and HR, not taking BB. 15. Hiccups: resolved off  gabapentin.  16. Multiple myeloma. Ixazomib was noted on d/c meds from [name], 67m Dexamethazone weekly, held during recent admission 17. Right Parotid gland lesion increasing in size since 2019 . Will need ENT f/u, pt states he was unaware of this finding  18.  Last recorded BM 6/16 6/17 still LBM 6/16, use prn's if needed 19.  Dispo wife uncomfortable with TF, even with RN coming into home to assist, FL2 completed for SNF, will explore LTACH options   LOS: 10 days A FACE TO FACE EVALUATION WAS PERFORMED  LLuetta Nutting6/17/2023, 4:26 PM

## 2021-08-29 NOTE — Progress Notes (Signed)
Occupational Therapy Session Note  Patient Details  Name: William Barron MRN: [id number] Date of Birth: 08-Sep-1945  Today's Date: 08/29/2021 OT Individual Time: 1300-1345 OT Individual Time Calculation (min): 45 min    Short Term Goals: Week 1:  OT Short Term Goal 1 (Week 1): Pt will perform toileting with supervision OT Short Term Goal 1 - Progress (Week 1): Met OT Short Term Goal 2 (Week 1): Pt will be able to stand without UE support - for clothing mangement OT Short Term Goal 2 - Progress (Week 1): Met OT Short Term Goal 3 (Week 1): LTG=STG  Skilled Therapeutic Interventions/Progress Updates:     Upon OT arrival, pt semi recumbent in bed. Pt reports no pain and was agreeable to OT treatment. OT intervention with a focus on UE strengthening, standing tolerance, functional transfers/mobility and overall endurance. Pt completes supine to sit transfer with Mod I and sits EOB to donn shoes with Mod I and complete B UE exercises using 3lb weight. Pt completes 1 x 10 reps of shoulder flex/ext, elbow flex/ext, overhead press, chest press, shoulder abd/add, supination/pronation, elbow flex/ext. Pt requires min verbal cues for proper form and demonstrates understanding. No LOB observed in sitting. Pt completes 6 STS transfers with RW and CGA and completes static standing trials to improve tolerance. Pt limited by essential tremors and weakness. Pt able to tolerate standing ~12 seconds before requiring seated rest break. Pt then completes 2 functional mobility trials with RW and Min A. Pt attempts to ambulate to the bathroom but makes it halfway before turning around to return to bed.  Pt completes stand pivot transfer into w/c with Supervision and was transported to bathroom to complete toilet transfer with Supervision, toileting with Max A, doff/donn undergarments and pants with Max A d/t time constraints and transfers back into w/c with Supervision. Pt was transported back to his bed via w/c and  completed stand pivot with Supervision. Pt completes sit to supine transfer with Supervision. Pt left in bed with all safety measures in place.   Therapy Documentation Precautions:  Precautions Precautions: Fall, Other (comment) Precaution Comments: GJ tube, orthostatic tremors, orthostatic vitals Restrictions Weight Bearing Restrictions: No  Therapy/Group: Individual Therapy  Raeanne Deschler 08/29/2021, 1:12 PM

## 2021-08-30 LAB — GLUCOSE, CAPILLARY
Glucose-Capillary: 140 mg/dL — ABNORMAL HIGH (ref 70–99)
Glucose-Capillary: 159 mg/dL — ABNORMAL HIGH (ref 70–99)
Glucose-Capillary: 167 mg/dL — ABNORMAL HIGH (ref 70–99)
Glucose-Capillary: 177 mg/dL — ABNORMAL HIGH (ref 70–99)
Glucose-Capillary: 187 mg/dL — ABNORMAL HIGH (ref 70–99)
Glucose-Capillary: 251 mg/dL — ABNORMAL HIGH (ref 70–99)
Glucose-Capillary: 97 mg/dL (ref 70–99)

## 2021-08-30 NOTE — Progress Notes (Signed)
PROGRESS NOTE   Subjective/Complaints: Patient doing well, toleration tube feeds at night and clamping during the day.  Said that she was unsure if he was ready to be discharged tomorrow since he still had PEG tube.  ROS: Patient denies CP, SOB, N/V/D  Objective:   No results found. No results for input(s): "WBC", "HGB", "HCT", "PLT" in the last 72 hours.   No results for input(s): "NA", "K", "CL", "CO2", "GLUCOSE", "BUN", "CREATININE", "CALCIUM" in the last 72 hours.    Intake/Output Summary (Last 24 hours) at 08/30/2021 1432 Last data filed at 08/30/2021 1307 Gross per 24 hour  Intake 660 ml  Output 800 ml  Net -140 ml        Physical Exam: Vital Signs Blood pressure 125/60, pulse 84, temperature 97.9 F (36.6 C), temperature source Oral, resp. rate 16, height 5' 5" (1.651 m), weight 51.8 kg, SpO2 100 %.    General: No acute distress, mood and affect are appropriate Heart: Regular rate and rhythm without rubs murmurs or additional heart sounds Lungs: CTA bilaterally breathing is normal and unlabored, no rales or wheezes Abdomen; bowel bowel sounds positive x4, nontender to palpation, nondistended, JG site CDI Extremities: No clubbing, cyanosis or edema Skin: No evidence of breakdown or rash, left forearm wrapped Psych: Pleasant and cooperative Neurologic: Cranial nerves II through XII intact, motor strength is 4+/5 in bilateral deltoid, bicep, tricep, grip, hip flexor, knee extensors, ankle dorsiflexor, and plantar flexor Right CN VI is still present.  HOH Musculoskeletal: FROM in all 4 extremities.  No joint swelling   Assessment/Plan: 1. Functional deficits which require 3+ hours per day of interdisciplinary therapy in a comprehensive inpatient rehab setting. Physiatrist is providing close team supervision and 24 hour management of active medical problems listed below. Physiatrist and rehab team continue to  assess barriers to discharge/monitor patient progress toward functional and medical goals  Care Tool:  Bathing    Body parts bathed by patient: Right arm, Left arm, Chest, Abdomen, Front perineal area, Buttocks, Right upper leg, Left upper leg, Right lower leg, Left lower leg, Face   Body parts bathed by helper: Buttocks     Bathing assist Assist Level: Supervision/Verbal cueing     Upper Body Dressing/Undressing Upper body dressing   What is the patient wearing?: Pull over shirt    Upper body assist Assist Level: Independent with assistive device    Lower Body Dressing/Undressing Lower body dressing      What is the patient wearing?: Underwear/pull up, Pants     Lower body assist Assist for lower body dressing: Contact Guard/Touching assist     Toileting Toileting    Toileting assist Assist for toileting: Maximal Assistance - Patient 25 - 49%     Transfers Chair/bed transfer  Transfers assist     Chair/bed transfer assist level: Contact Guard/Touching assist Chair/bed transfer assistive device: Armrests   Locomotion Ambulation   Ambulation assist      Assist level: Contact Guard/Touching assist Assistive device: Walker-rolling Max distance: 120   Walk 10 feet activity   Assist     Assist level: Contact Guard/Touching assist Assistive device: Walker-rolling   Walk 50 feet activity  Assist Walk 50 feet with 2 turns activity did not occur: Safety/medical concerns  Assist level: Contact Guard/Touching assist Assistive device: Walker-rolling    Walk 150 feet activity   Assist Walk 150 feet activity did not occur: Safety/medical concerns         Walk 10 feet on uneven surface  activity   Assist Walk 10 feet on uneven surfaces activity did not occur: Safety/medical concerns         Wheelchair     Assist Is the patient using a wheelchair?: Yes Type of Wheelchair: Manual    Wheelchair assist level: Supervision/Verbal  cueing Max wheelchair distance: 100    Wheelchair 50 feet with 2 turns activity    Assist        Assist Level: Supervision/Verbal cueing   Wheelchair 150 feet activity     Assist      Assist Level: Dependent - Patient 0%   Blood pressure 125/60, pulse 84, temperature 97.9 F (36.6 C), temperature source Oral, resp. rate 16, height 5' 5" (1.651 m), weight 51.8 kg, SpO2 100 %.  Medical Problem List and Plan: 1. Functional deficits secondary to Debility s/p Whipple procedure 07/13/21             -patient may shower cover J tube             -ELOS/Goals: 7-10 days -Continue CIR therapies including PT, OT   2.  Antithrombotics: -DVT/anticoagulation:  Pharmaceutical: Lovenox             -antiplatelet therapy: N/a 3. Pain Management:  oxycodone prn.  4. Mood: LCSW to follow for evaluation and support.              -antipsychotic agents: N/A 5. Neuropsych: This patient is capable of making decisions on his own behalf. 6. Skin/Wound Care: Monitor wound for healing.  7. Fluids/Electrolytes/Nutrition: Monitor I/O.   NPO until GJ replaced then ok to resume ful liquid today , advance to soft diet in am  6/17 Patient reports doing well on soft diet 6/18 Says he ate a good portion of his breakfast today.  Reports very infrequent hiccups 8.Whipple for IPMN (Dr. Nussbaum): On tube feeds via J tube.             -Clear liquids tolerated Tolerating q24 h clamp 6/18 tolerating clamp, for 24hrs with 30 min to suction   9. Candidal esophagitis: On Fluconazole X 10 days-->>ended 06/09  10. T2DM: Hgb A1C-             --continue Semglee 6 units with novolog prn every 4 hours          CBG (last 3)  Recent Labs    08/30/21 0413 08/30/21 0804 08/30/21 1132  GLUCAP 251* 97 167*   Controlled 6/14 but has run low likely due to lack of J tube feeds 6/17 CBGs are controlled ranging from 104-178 with his overnight tube feeds 6/18 A bit more variation in CBG's 97-251 today 11. Amyloidosis:  chronic right CN VI palsy 12. ESBL Kleb/Citrobacter: Asymptomatic bacteruria being monitored.  13.  Leucocytosis: Resolved see below  14. Prostate cancer s/p XRT: On hormone replacement therapy  15. Iron deficiency/ABLA: On iron supplement. HGB 8.0 Monitor     Latest Ref Rng & Units 08/24/2021    6:03 AM 08/20/2021    5:51 AM 08/19/2021    3:23 AM  CBC  WBC 4.0 - 10.5 K/uL 6.0  9.6  9.7   Hemoglobin 13.0 - 17.0 g/dL   8.2  8.6  8.0   Hematocrit 39.0 - 52.0 % 25.9  27.2  25.7   Platelets 150 - 400 K/uL 539  615  585     6/12 Hgb stable  6/17 Hgb stable at 8.2  14. Tachycardia: On low dose BB.  Noted to have frequent PVCs. He had prolonged QT that resolved with electrolyte repletion. Vitals:   08/30/21 0421 08/30/21 1330  BP: (!) 108/59 125/60  Pulse: 87 84  Resp: 18 16  Temp: 98.3 F (36.8 C) 97.9 F (36.6 C)  SpO2: 98% 100%   HR and BP ok off BB 6/17 Okay BP and HR, not taking BB. 15. Hiccups: resolved off  gabapentin.  6/18 Still reports some hiccups, but infrequent. 16. Multiple myeloma. Ixazomib was noted on d/c meds from [name], 28m Dexamethazone weekly, held during recent admission 17. Right Parotid gland lesion increasing in size since 2019 . Will need ENT f/u, pt states he was unaware of this finding  18.  Last recorded BM 6/16 6/17 still LBM 6/16, use prn's if needed 6/18 LBM today. 120  Dispo wife uncomfortable with TF, even with RN coming into home to assist, FL2 completed for SNF, will explore LTACH options   LOS: 11 days A FACE TO FACE EVALUATION WAS PERFORMED  LLuetta Nutting6/18/2023, 2:32 PM

## 2021-08-30 NOTE — Progress Notes (Signed)
Occupational Therapy Session Note  Patient Details  Name: William Barron MRN: [id number] Date of Birth: Mar 25, 1945  Today's Date: 08/30/2021 OT Individual Time: 1001-1047 OT Individual Time Calculation (min): 46 min    Short Term Goals: Week 2:  OT Short Term Goal 1 (Week 2): LTG=STG  Skilled Therapeutic Interventions/Progress Updates:  Pt greeted supine in bed  agreeable to OT intervention. Session focus on BADL reeducation, functional mobility, dynamic standing balance and decreasing overall caregiver burden.     Pt completed supine>sit from flat [facility] MODI with bed rails. CGA for functional ambulation from EOB>bathroom for toileting with RW. + BM set- up assist for posterior pericare with pt using grab bar on R side for balance. Pt ambulated to shower for bathing with RW and CGA ( peg site covered with shower shield and drain sponge changed post shower). Pt completed bathing from shower seat with supervision. Pt able to don [place] shirt MODI, MIN A for LB dressing for time mgmt. Pt ambulated back to w/c with RW and CGA, no reports of dizziness during session. pt left seated in w/c with alarm activated and all needs within reach.                        Therapy Documentation Precautions:  Precautions Precautions: Fall, Other (comment) Precaution Comments: GJ tube, orthostatic tremors, orthostatic vitals Restrictions Weight Bearing Restrictions: No   Pain: no pain reported during session     Therapy/Group: Individual Therapy  Corinne Ports [facility] 08/30/2021, 12:08 PM

## 2021-08-31 ENCOUNTER — Ambulatory Visit: Payer: Medicare Other | Admitting: Endocrinology

## 2021-08-31 DIAGNOSIS — D75838 Other thrombocytosis: Secondary | ICD-10-CM

## 2021-08-31 DIAGNOSIS — E876 Hypokalemia: Secondary | ICD-10-CM

## 2021-08-31 DIAGNOSIS — D62 Acute posthemorrhagic anemia: Secondary | ICD-10-CM

## 2021-08-31 LAB — BASIC METABOLIC PANEL
Anion gap: 9 (ref 5–15)
BUN: 14 mg/dL (ref 8–23)
CO2: 24 mmol/L (ref 22–32)
Calcium: 8.3 mg/dL — ABNORMAL LOW (ref 8.9–10.3)
Chloride: 104 mmol/L (ref 98–111)
Creatinine, Ser: 1.07 mg/dL (ref 0.61–1.24)
GFR, Estimated: >60 mL/min (ref >60)
Glucose, Bld: 114 mg/dL — ABNORMAL HIGH (ref 70–99)
Potassium: 3.3 mmol/L — ABNORMAL LOW (ref 3.5–5.1)
Sodium: 137 mmol/L (ref 135–145)

## 2021-08-31 LAB — CBC
HCT: 28.5 % — ABNORMAL LOW (ref 39.0–52.0)
Hemoglobin: 8.8 g/dL — ABNORMAL LOW (ref 13.0–17.0)
MCH: 26.7 pg (ref 26.0–34.0)
MCHC: 30.9 g/dL (ref 30.0–36.0)
MCV: 86.6 fL (ref 80.0–100.0)
Platelets: 461 10*3/uL — ABNORMAL HIGH (ref 150–400)
RBC: 3.29 MIL/uL — ABNORMAL LOW (ref 4.22–5.81)
RDW: 16.2 % — ABNORMAL HIGH (ref 11.5–15.5)
WBC: 7.6 10*3/uL (ref 4.0–10.5)
nRBC: 0 % (ref 0.0–0.2)

## 2021-08-31 LAB — GLUCOSE, CAPILLARY
Glucose-Capillary: 161 mg/dL — ABNORMAL HIGH (ref 70–99)
Glucose-Capillary: 152 mg/dL — ABNORMAL HIGH (ref 70–99)
Glucose-Capillary: 209 mg/dL — ABNORMAL HIGH (ref 70–99)

## 2021-08-31 MED ORDER — PROSOURCE TF PO LIQD: 45.0000 mL | Freq: Two times a day (BID) | ORAL | Status: DC

## 2021-08-31 MED ORDER — ALUM & MAG HYDROXIDE-SIMETH 200-200-20 MG/5ML PO SUSP: 30.0000 mL | ORAL | 0 refills | Status: DC | PRN

## 2021-08-31 MED ORDER — POTASSIUM CHLORIDE 20 MEQ PO PACK
40.0000 meq | PACK | Freq: Once | ORAL | Status: AC
Start: 2021-08-31 — End: 2021-08-31
  Administered 2021-08-31: 40 meq via ORAL
  Filled 2021-08-31 (×2): qty 2

## 2021-08-31 MED ORDER — GABAPENTIN 250 MG/5ML PO SOLN
100.0000 mg | Freq: Three times a day (TID) | ORAL | Status: DC
Start: 2021-08-31 — End: 2021-08-31
  Administered 2021-08-31: 100 mg
  Filled 2021-08-31 (×2): qty 2

## 2021-08-31 MED ORDER — CHLORPROMAZINE HCL 10 MG PO TABS: 10.0000 mg | ORAL_TABLET | Freq: Three times a day (TID) | ORAL | Status: DC | PRN

## 2021-08-31 MED ORDER — CHLORPROMAZINE HCL 10 MG PO TABS: 5.0000 mg | ORAL_TABLET | Freq: Three times a day (TID) | ORAL | Status: DC | PRN

## 2021-08-31 MED ORDER — GLUCERNA 1.5 CAL PO LIQD: 1000.0000 mL | ORAL | Status: DC

## 2021-08-31 MED ORDER — FERROUS SULFATE 300 (60 FE) MG/5ML PO SYRP: 300.0000 mg | ORAL_SOLUTION | ORAL | 3 refills | Status: DC

## 2021-08-31 MED ORDER — ORAL CARE MOUTH RINSE: 15.0000 mL | Freq: Two times a day (BID) | OROMUCOSAL | 0 refills | Status: DC

## 2021-08-31 MED ORDER — FREE WATER: 150.0000 mL | Status: DC

## 2021-08-31 MED ORDER — TESTOSTERONE 50 MG/5GM (1%) TD GEL: 10.0000 g | Freq: Every day | TRANSDERMAL | Status: DC

## 2021-08-31 MED ORDER — POTASSIUM CHLORIDE 20 MEQ PO PACK
40.0000 meq | PACK | Freq: Once | ORAL | Status: AC
  Administered 2021-08-31: 40 meq via ORAL

## 2021-08-31 MED ORDER — MEGESTROL ACETATE 400 MG/10ML PO SUSP: 400.0000 mg | Freq: Two times a day (BID) | ORAL | 0 refills | Status: DC

## 2021-08-31 MED ORDER — GABAPENTIN 250 MG/5ML PO SOLN: 100.0000 mg | Freq: Three times a day (TID) | ORAL | 12 refills | Status: DC

## 2021-08-31 MED ORDER — POLYETHYLENE GLYCOL 3350 17 G PO PACK: 17.0000 g | PACK | Freq: Every day | ORAL | 0 refills | Status: DC | PRN

## 2021-08-31 MED ORDER — POTASSIUM CHLORIDE CRYS ER 20 MEQ PO TBCR
40.0000 meq | EXTENDED_RELEASE_TABLET | ORAL | Status: AC
  Filled 2021-08-31: qty 2

## 2021-08-31 MED ORDER — PROCHLORPERAZINE MALEATE 5 MG PO TABS: 5.0000 mg | ORAL_TABLET | Freq: Four times a day (QID) | ORAL | 0 refills | Status: DC | PRN

## 2021-08-31 MED ORDER — GLUCERNA 1.5 CAL PO LIQD: ORAL | Status: DC

## 2021-08-31 MED ORDER — MELATONIN 3 MG PO TABS: 6.0000 mg | ORAL_TABLET | Freq: Every evening | ORAL | 0 refills | Status: DC | PRN

## 2021-08-31 MED ORDER — INSULIN ASPART 100 UNIT/ML IJ SOLN: 0.0000 [IU] | INTRAMUSCULAR | 11 refills | Status: DC

## 2021-08-31 MED ORDER — PANTOPRAZOLE SODIUM 40 MG PO PACK: 40.0000 mg | PACK | Freq: Every day | ORAL | Status: DC

## 2021-08-31 MED ORDER — PHENOL 1.4 % MT LIQD: 1.0000 | OROMUCOSAL | 0 refills | Status: DC | PRN

## 2021-08-31 MED ORDER — ACETAMINOPHEN 325 MG PO TABS: 325.0000 mg | ORAL_TABLET | ORAL | Status: DC | PRN

## 2021-08-31 NOTE — Progress Notes (Signed)
Occupational Therapy Discharge Summary  Patient Details  Name: William Barron MRN: [id number] Date of Birth: Sep 12, 1945  {CHL IP REHAB OT TIME CALCULATIONS:304400400}   Patient has met {NUMBERS 0-12:18577} of {NUMBERS 0-12:18577} long term goals due to {due AL:9379024}.  Patient to discharge at overall {LOA:3049010} level.  Patient's care partner {care partner:3041650} to provide the necessary {assistance:3041652} assistance at discharge.    Reasons goals not met: ***  Recommendation:  Patient will benefit from ongoing skilled OT services in {setting:3041680} to continue to advance functional skills in the area of {ADL/iADL:3041649}.  Equipment: {equipment:3041657}  Reasons for discharge: {Reason for discharge:3049018}  Patient/family agrees with progress made and goals achieved: {Pt/Family agree with progress/goals:3049020}  OT Discharge  Pain Pain Assessment Pain Scale: 0-10 Pain Score: 0-No pain ADL ADL Eating: Set up Grooming: Setup Where Assessed-Grooming: Sitting at sink Where Assessed-Upper Body Bathing: Sitting at sink Lower Body Bathing: Minimal assistance Where Assessed-Lower Body Bathing: Sitting at sink Upper Body Dressing: Setup Where Assessed-Upper Body Dressing: Sitting at sink Lower Body Dressing: Minimal assistance Where Assessed-Lower Body Dressing: Sitting at sink Toileting: Maximal assistance Where Assessed-Toileting: Glass blower/designer: Psychiatric nurse Method: Counselling psychologist: Energy manager: Not assessed Vision Baseline Vision/History: 1 Wears glasses Patient Visual Report: No change from baseline;Other (comment) (pt with impaired R eye alignment at baseline and frequently closes R eye when trying to focus his vision) Perception  Perception: Within Functional Limits Praxis Praxis: Intact Cognition Cognition Overall Cognitive Status: Within Functional Limits for tasks  assessed Arousal/Alertness: Awake/alert Orientation Level: Person;Place;Situation Person: Oriented Place: Oriented Situation: Oriented Memory: Appears intact Attention: Selective;Sustained Focused Attention: Appears intact Sustained Attention: Appears intact Selective Attention: Appears intact Awareness: Appears intact Problem Solving: Appears intact Safety/Judgment: Appears intact Brief Interview for Mental Status (BIMS) Repetition of Three Words (First Attempt): 3 Temporal Orientation: Year: Correct Temporal Orientation: Month: Accurate within 5 days Temporal Orientation: Day: Correct Recall: "Sock": Yes, no cue required Recall: "Blue": Yes, no cue required Recall: "Bed": Yes, no cue required BIMS Summary Score: 15 Sensation Sensation Light Touch: Appears Intact Hot/Cold: Appears Intact Proprioception: Appears Intact Stereognosis: Not tested Coordination Gross Motor Movements are Fluid and Coordinated: No Fine Motor Movements are Fluid and Coordinated: No Coordination and Movement Description: significant deconditioning; orthostatic tremors Motor  Motor Motor: Other (comment) Motor - Discharge Observations: orthorstatic tremors at baseline Mobility  Bed Mobility Bed Mobility: Sit to Supine;Supine to Sit Supine to Sit: Independent Sitting - Scoot to Edge of Bed: Independent Sit to Supine: Independent Transfers Sit to Stand: Contact Guard/Touching assist Stand to Sit: Contact Guard/Touching assist  Trunk/Postural Assessment  Cervical Assessment Cervical Assessment: Exceptions to [facility] Postural Control Protective Responses: delayed and impacted by tremors  Balance Balance Balance Assessed: Yes Static Sitting Balance Static Sitting - Level of Assistance: 7: Independent Dynamic Sitting Balance Dynamic Sitting - Balance Support: During functional activity Dynamic Sitting - Level of Assistance: 5: Stand by assistance Static Standing Balance Static Standing -  Balance Support: During functional activity;Bilateral upper extremity supported Static Standing - Level of Assistance: 4: Min assist;5: Stand by assistance Dynamic Standing Balance Dynamic Standing - Balance Support: During functional activity Dynamic Standing - Level of Assistance: 4: Min assist;5: Stand by assistance Extremity/Trunk Assessment RUE Assessment RUE Assessment: Within Functional Limits LUE Assessment LUE Assessment: Within Functional Limits   [name] 08/31/2021, 10:42 AM

## 2021-08-31 NOTE — Progress Notes (Signed)
Physical Therapy Discharge Summary  Patient Details  Name: William Barron MRN: [id number] Date of Birth: 28-Apr-1945  Today's Date: 08/31/2021 PT Individual Time:  -    Pt missed all PT time today 2/2 eating, and refused 2/2 being D/Cd to SNF for continued care.   Patient has met 2 of 8 long term goals due to improved activity tolerance, improved balance, and improved coordination.  Patient to discharge at  continued therapy   level Min Assist.   Pt being D/C'd to SNF for continued care unable to be provided in personal home.  Reasons goals not met: Pt still requires CGA for all transfers including bed<> chair, sit to stand and car, gait training from 47-120' w/ RW and CGA and stairs require min A using 2 rails.  Pt is Independent w/ bed mobility including rolling, sup <> sit.    Recommendation:  Patient will benefit from ongoing skilled PT services in skilled nursing facility setting to continue to advance safe functional mobility, address ongoing impairments in balance, strength, endurance and transfers, and minimize fall risk.  Equipment: No equipment provided  Reasons for discharge: discharge from hospital and continue therapy in SNF setting.  Patient/family agrees with progress made and goals achieved: Yes  PT Discharge Precautions/Restrictions   Vital Signs Therapy Vitals Temp: 97.6 F (36.4 C) Temp Source: Oral Pulse Rate: 92 Resp: 18 BP: 110/63 Patient Position (if appropriate): Sitting Oxygen Therapy SpO2: 100 % O2 Device: Room Air Pain Pain Assessment Pain Scale: 0-10 Pain Score: 0-No pain Pain Interference Pain Interference Pain Effect on Sleep: 0. Does not apply - I have not had any pain or hurting in the past 5 days Pain Interference with Therapy Activities: 1. Rarely or not at all Pain Interference with Day-to-Day Activities: 1. Rarely or not at all Vision/Perception     Cognition Arousal/Alertness: Awake/alert Safety/Judgment: Appears  intact Sensation Sensation Light Touch: Appears Intact Coordination Gross Motor Movements are Fluid and Coordinated: No Fine Motor Movements are Fluid and Coordinated: No Motor  Motor Motor: Other (comment) Motor - Discharge Observations: orthorstatic tremors at baseline  Mobility Bed Mobility Bed Mobility: Sit to Supine;Supine to Sit Supine to Sit: Independent Sitting - Scoot to Edge of Bed: Independent Sit to Supine: Independent Transfers Transfers: Sit to Stand;Stand to Sit;Stand Pivot Transfers;Transfer Sit to Stand: Contact Guard/Touching assist Stand to Sit: Contact Guard/Touching assist Stand Pivot Transfers: Contact Guard/Touching assist Stand Pivot Transfer Details: Verbal cues for gait pattern;Verbal cues for sequencing;Verbal cues for safe use of DME/AE Stand Pivot Transfer Details (indicate cue type and reason): 2/2 increased tremors. Transfer (Assistive device): Manufacturing systems engineer Ambulation: Yes Gait Assistance: Chief Executive Officer (Feet): 47 Feet Assistive device: Rolling walker Gait Assistance Details: Verbal cues for safe use of DME/AE Gait Gait Pattern: Impaired Gait Pattern: Decreased step length - right;Decreased step length - left Stairs / Additional Locomotion Stairs: Yes Stairs Assistance: Minimal Assistance - Patient > 75% Stair Management Technique: Two rails Number of Stairs: 4 Wheelchair Mobility Wheelchair Mobility: No  Trunk/Postural Assessment  Cervical Assessment Cervical Assessment: Exceptions to [facility] Postural Control Protective Responses: delayed and impacted by tremors  Balance Balance Balance Assessed: Yes Static Sitting Balance Static Sitting - Level of Assistance: 7: Independent Dynamic Sitting Balance Dynamic Sitting - Balance Support: During functional activity Dynamic Sitting - Level of Assistance: 5: Stand by assistance Static Standing Balance Static Standing - Balance Support: During  functional activity;Bilateral upper extremity supported Static Standing - Level of Assistance: 4:  Min assist;5: Stand by assistance Dynamic Standing Balance Dynamic Standing - Balance Support: During functional activity Dynamic Standing - Level of Assistance: 4: Min assist;5: Stand by assistance Extremity Assessment      RLE Assessment RLE Assessment: Within Functional Limits General Strength Comments: grossly 4+ to 5/5 LLE Assessment LLE Assessment: Within Functional Limits General Strength Comments: grossly 5/5    [name] 08/31/2021, 3:11 PM

## 2021-08-31 NOTE — Progress Notes (Signed)
Home medication(testosterone gel) returned to pt from pharmacy at this time.

## 2021-08-31 NOTE — Progress Notes (Signed)
Patient ID: William Barron, male   DOB: Mar 31, 1945, 76 y.o.   MRN: [id number]  This SW covering for primary SW, Erlene Quan.   SW spoke with William Barron/Admissions with William Barron to discuss if bed available. Reports she will call after morning meeting to share updates.  *Confirms that pt has a bed available, however, waiting to hear from supplier on when/if tube feeds will arrive today. SW updated medical team. SW tentatively scheduled ambulance transport with [facility] pick up at 3:30pm.   1224-SW called [name] to inform that pt will likely d/c today, and explained above. SW reminded her to keep appointment with William Barron for admission paperwork. SW shared will f/u once there is an update.   1255-SW received updates from William Barron/Admissions reporting tube feeds have arrived. Wife is coming to complete paperwork. Pt going to Rm# [id number]; nurse report [phone].  SW updated [name] and medical team on d/c today.   Loralee Pacas, MSW, [place] Office: [phone] Cell: [phone] Fax: [phone]

## 2021-08-31 NOTE — Progress Notes (Signed)
Called report to [place] at this time.

## 2021-08-31 NOTE — Progress Notes (Signed)
Physical Therapy Session Note  Patient Details  Name: William Barron MRN: [id number] Date of Birth: September 10, 1945  Today's Date: 08/31/2021 PT Individual Time:  -    Missed time of 30 min in AM and 45 min in PM.  Short Term Goals: Week 2:  PT Short Term Goal 1 (Week 2): Pt will negotiate 4 steps w/ R hand rail only w/ CGA  Skilled Therapeutic Interventions/Progress Updates:   First session:  Pt sitting up eating and states did not have a good night, wants to eat.  PT returned, but still eating.  Will attempt second session in PM.  Second session:  Pt will be D/c'd to SNF soon per pt, refuses therapy session at this time.  Please see D/C note written.     Therapy Documentation Precautions:  Precautions Precautions: Fall, Other (comment) Precaution Comments: GJ tube, orthostatic tremors, orthostatic vitals Restrictions Weight Bearing Restrictions: No General: PT Amount of Missed Time (min): 30 Minutes PT Missed Treatment Reason: Other (Comment) (pt eating, not doing well.) Vital Signs: Therapy Vitals Temp: 97.6 F (36.4 C) Temp Source: Oral Pulse Rate: 92 Resp: 18 BP: 110/63 Patient Position (if appropriate): Sitting Oxygen Therapy SpO2: 100 % O2 Device: Room Air Pain:   Mobility:   Locomotion :    Trunk/Postural Assessment :    Balance:   Exercises:   Other Treatments:      Therapy/Group: Individual Therapy  [name] 08/31/2021, 2:45 PM

## 2021-08-31 NOTE — Discharge Summary (Signed)
Physician Discharge Summary  Patient ID: William Barron MRN: [id number] DOB/AGE: 1946-02-12 76 y.o.  Admit date: 08/19/2021 Discharge date: 08/31/2021  Discharge Diagnoses:  Principal Problem:   Debility Active Problems:   Fatigue   Benign essential tremor   H/O Whipple procedure   Chronic hiccups   Diabetes (HCC)   Lesion of parotid gland   Malnutrition of moderate degree   Jejunostomy tube present (HCC)   Postoperative anemia due to acute blood loss   Reactive thrombocytosis   Hypokalemia   Discharged Condition: stable  Significant Diagnostic Studies: IR Mech Remov Obstruc Mat Any Colon Tube W/Fluoro  Result Date: 08/27/2021 INDICATION: Clogged gastrojejunostomy tube EXAM: Gastrojejunostomy tube check and clearance of mechanical obstruction using fluoroscopy MEDICATIONS: None ANESTHESIA/SEDATION: None CONTRAST:  20 mL-administered into the gastric lumen. FLUOROSCOPY: Radiation Exposure Index (as provided by the fluoroscopic device): 1.9 minutes (3 mGy) COMPLICATIONS: None immediate. PROCEDURE: Informed written consent was obtained from the patient after a thorough discussion of the procedural risks, benefits and alternatives. All questions were addressed. Maximal Sterile Barrier Technique was utilized including caps, mask, sterile gowns, sterile gloves, sterile drape, hand hygiene and skin antiseptic. A timeout was performed prior to the initiation of the procedure. The patient was placed supine on the exam table. The mid abdomen was prepped and draped in a standard sterile fashion with inclusion of the existing gastrojejunostomy tube within the sterile field. A Christmas tree adapter was attached to the jejunostomy port and forceful injection of sterile saline cleared the mechanical obstruction. Patency and appropriate location of the jejunostomy and of the catheter was confirmed with injection of contrast material, was confirmed a patent gastrojejunostomy tube with location within  the small bowel. The patient tolerated the procedure well without immediate complication. IMPRESSION: Successful clearance of the mechanical obstruction of the jejunostomy port of the gastrojejunostomy tube. Recommend continued flushing at least 3 times daily and after meals and medication administration to prevent future occurrences. Electronically Signed   By: Albin Felling M.D.   On: 08/27/2021 14:47   DG Abd 1 View  Result Date: 08/25/2021 CLINICAL DATA:  Clogged J port EXAM: ABDOMEN - 1 VIEW COMPARISON:  CT 08/11/2021 FINDINGS: Gastro jejunostomy balloon positioned over the stomach, tip of the catheter overlies left sacrum and is presumably within jejunum in this patient with history of prior Whipple procedure. Position of the tube similar as compared with the CT from May. Small metallic densities in the left lower quadrant are noted. There is a nonobstructed gas pattern with moderate stool. Prostate seeds are noted IMPRESSION: Gastrojejunostomy tube with tip in the left lower quadrant likely within jejunum. There are small metallic densities in the left lower quadrant. Electronically Signed   By: Donavan Foil M.D.   On: 08/25/2021 19:00   MR BRAIN WO CONTRAST  Result Date: 08/13/2021 CLINICAL DATA:  Neuro deficit, stroke suspected EXAM: MRI HEAD WITHOUT CONTRAST TECHNIQUE: Multiplanar, multiecho pulse sequences of the brain and surrounding structures were obtained without intravenous contrast. COMPARISON:  CT head 02/08/2021, MR head 01/16/2018 FINDINGS: Brain: There is no acute intracranial hemorrhage, extra-axial fluid collection, or acute infarct There is mild global parenchymal volume loss with prominence of the ventricular system and extra-axial CSF spaces. The ventricles are stable in size since 2019. Patchy foci of FLAIR signal abnormality throughout the subcortical and periventricular white matter are overall unchanged, nonspecific but likely reflecting sequela of chronic white matter  microangiopathy. There is no suspicious parenchymal signal abnormality. There is no mass lesion. There is  no mass effect or midline shift. Vascular: Normal flow voids. Skull and upper cervical spine: Normal marrow signal. Sinuses/Orbits: Paranasal sinuses are clear. The globes and orbits are unremarkable. Other: There is an ovoid lesion in the region of the right parotid gland measuring 1.4 cm x 1.9 cm (5-2, 10-10). This lesion was likely present on the study from 2019 but is increased in size IMPRESSION: 1. No acute intracranial pathology. 2. Mild parenchymal volume loss and chronic white matter microangiopathy. 3. 1.9 cm lesion in the region of the right parotid gland is increased in size since 2019 suspicious for parotid neoplasm. Recommend nonemergent ENT referral. Electronically Signed   By: Valetta Mole M.D.   On: 08/13/2021 12:40   CT ABDOMEN PELVIS W CONTRAST  Result Date: 08/11/2021 CLINICAL DATA:  Abdominal pain.  Recent Whipple procedure. EXAM: CT ABDOMEN AND PELVIS WITH CONTRAST TECHNIQUE: Multidetector CT imaging of the abdomen and pelvis was performed using the standard protocol following bolus administration of intravenous contrast. RADIATION DOSE REDUCTION: This exam was performed according to the departmental dose-optimization program which includes automated exposure control, adjustment of the mA and/or kV according to patient size and/or use of iterative reconstruction technique. CONTRAST:  157m OMNIPAQUE IOHEXOL 300 MG/ML  SOLN COMPARISON:  None Available. FINDINGS: Lower chest: Bulky partially calcified adenopathy in the mediastinum and bilateral hila, unchanged since prior study. Coronary artery and aortic calcifications. Coarsened calcifications in the lingula. Bibasilar atelectasis. No effusions. Hepatobiliary: Prior cholecystectomy.  No focal hepatic abnormality. Pancreas: Interval resection of previously seen pancreatic head cystic mass. Pancreatic body and tail remain present where  there are extensive calcifications, unchanged since prior study. Spleen: No focal abnormality or ductal dilatation. Adrenals/Urinary Tract: Punctate nonobstructing stones in the left kidney. No ureteral stones or hydronephrosis. Adrenal glands and urinary bladder unremarkable. Stomach/Bowel: Feeding tube within the stomach. Nonobstructive bowel gas pattern. Vascular/Lymphatic: Aortic atherosclerosis. No evidence of aneurysm or adenopathy. Reproductive: Prostate enlargement. Other: No free fluid or free air. Small left inguinal hernia containing fat and fluid. Musculoskeletal: No acute bony abnormality. IMPRESSION: Interval resection of pancreatic head and associated pancreatic head cystic mass. No visible complicating feature. Pancreatic body and tail remain present with extensive calcifications, unchanged since prior study. No complicating postoperative findings. Bulky mediastinal and bilateral hilar adenopathy with stippled calcifications again noted, unchanged since prior study. Aortic atherosclerosis. Prostate enlargement. Electronically Signed   By: KRolm BaptiseM.D.   On: 08/11/2021 21:50   DG Chest Portable 1 View  Result Date: 08/11/2021 CLINICAL DATA:  Cough. EXAM: PORTABLE CHEST 1 VIEW COMPARISON:  Chest x-ray dated March 05, 2021. FINDINGS: The heart size and mediastinal contours are within normal limits. Normal pulmonary vascularity. Diffuse tiny interstitial nodularity is unchanged. Chronic scarring at the left lung base and costophrenic angle. No focal consolidation, pleural effusion, or pneumothorax. No acute osseous abnormality. Small bilateral calcified axillary lymph nodes again noted. Gastrostomy tube projecting over the left upper quadrant. IMPRESSION: 1. No active disease. 2. Similar chronic interstitial lung disease. Electronically Signed   By: WTitus DubinM.D.   On: 08/11/2021 19:22     Labs:  Basic Metabolic Panel:    Latest Ref Rng & Units 08/31/2021    6:04 AM 08/24/2021     6:03 AM 08/20/2021    5:51 AM  BMP  Glucose 70 - 99 mg/dL 114  140  138   BUN 8 - 23 mg/dL '14  18  20   '$ Creatinine 0.61 - 1.24 mg/dL 1.07  0.92  0.83  Sodium 135 - 145 mmol/L 137  136  135   Potassium 3.5 - 5.1 mmol/L 3.3  3.5  3.9   Chloride 98 - 111 mmol/L 104  100  99   CO2 22 - 32 mmol/L '24  28  27   '$ Calcium 8.9 - 10.3 mg/dL 8.3  8.5  8.6      CBC:    Latest Ref Rng & Units 08/31/2021    6:04 AM 08/24/2021    6:03 AM 08/20/2021    5:51 AM  CBC  WBC 4.0 - 10.5 K/uL 7.6  6.0  9.6   Hemoglobin 13.0 - 17.0 g/dL 8.8  8.2  8.6   Hematocrit 39.0 - 52.0 % 28.5  25.9  27.2   Platelets 150 - 400 K/uL 461  539  615      CBG: Recent Labs  Lab 08/30/21 2030 08/30/21 2345 08/31/21 0357 08/31/21 0815 08/31/21 1131  GLUCAP 140* 177* 161* 152* 209*    Brief HPI:   William Barron is a 76 y.o. male with history of amyloidosis, prostate CA s/p XRT, T2DM, CAD, essential and orthostatic tremors, pancreatic mass s/p Whipple with GJ tube placement at [facility] by Dr. Karleen Dolphin.  Hospital course was complicated by delirium, UTI, candidal esophagitis as well as need for continuous suction via G-tube due to intractable hiccups.  He was discharged to [facility] who was unable to provide care needed and he was sent back to ED for management.  She was admitted to [facility] on 08/12/2021 and reported increasing abdominal pain, hiccups as well as cough.  Thorazine was changed to gabapentin and G port to suction.  He was started on clears for comfort and was maintained on continuous tube feeding via J-tube.  PT OT was working with patient to continue be limited by fatigue, ataxic gait as well as shortness of breath due to anxiety.  CIR was recommended due to functional decline.   Hospital Course: William Barron was admitted to rehab 08/19/2021 for inpatient therapies to consist of PT, ST and OT at least three hours five days a week. Past admission physiatrist, therapy team and rehab RN have worked together to  provide customized collaborative inpatient rehab. His blood pressures were monitored on TID basis and have been stable. He completed 10 day course of fluconazole on 06/09.  Follow up CBC showed that reactive leucocytosis has resolves and H/H is relatively stable and reactive thrombocytosis is resolving. Serial check of lytes did show drop in potassium level today and he was supplemented with 40 meq. Recommend repeat BMET on Wednesday to monitor for stability.  His diabetes has been monitored with ac/hs CBG checks and SSI was use prn for tighter BS control. At admission, he was on full liquid diet with gastrostomy tube to drainage. His jejunostomy tube was noted to be clogged on 06/14 and attempts to unclog at beside was unsuccessful as occlusion felt to be more distal.Interventional radiology was consulted and was able to clear mechanical obstruction in radiology suite.   He was started on clamping protocol per input from Dr. Hyman Hopes and has tolerated clamping 24 hours therefore diet was advanced to soft, low residue.  Megace was added to stimulate appetite and tube feeds were changed to night times. His blood sugars started trending downwards off tube feeds and insulin glargline was discontinued. He is tolerating current diet without difficulty.  Gabapentin was discontinued as hiccups had resolved but this has recurred in the past 48 hours. Gabapentin has been  added back today with thorazine prn.  He has been making gains with therapy. Wife was concerned about PEG as well as tube feeds and  has elected SNF for progressive therapy. He was discharged to [facility] on 08/31/21.     Rehab course: During patient's stay in rehab weekly team conferences were held to monitor patient's progress, set goals and discuss barriers to discharge. At admission, patient required min assist with ADL tasks and min to mod assist with mobility. He  has had improvement in activity tolerance, balance, postural control as well  as ability to compensate for deficits. He requires set up to min assist with ADL tasks. He requires CGA for transfers and to ambulate  120' with RW.     Disposition:  [facility] SNF  Diet: Soft/low residue diet  Special Instructions: Continue tube feeds at nights. Can decrease water flushes as fluid intake improves Use SSI ac/hs per protocol for elevated BS Recommend repeat BMET on 06/21 to monitor potassium level.  4.   Will need to follow up with ENT for evaluation of right parotid gland lesion.   Allergies as of 08/31/2021       Reactions   Penicillins Swelling, Rash   Pt received ancef on 04-15-2021 without issue        Medication List     STOP taking these medications    fluconazole 40 MG/ML suspension Commonly known as: DIFLUCAN   HYDROcodone-acetaminophen 5-325 MG tablet Commonly known as: NORCO/VICODIN   insulin regular 100 units/mL injection Commonly known as: NOVOLIN R   omeprazole 2 mg/mL Susp oral suspension Commonly known as: FIRST-Omeprazole   oxyCODONE 5 MG/5ML solution Commonly known as: ROXICODONE   SEMGLEE Freeman   Testosterone 20.25 MG/ACT (1.62%) Gel Replaced by: testosterone 50 MG/5GM (1%) Gel   traZODone 50 MG tablet Commonly known as: DESYREL       TAKE these medications    acetaminophen 325 MG tablet Commonly known as: TYLENOL Place 1-2 tablets (325-650 mg total) into feeding tube every 4 (four) hours as needed for mild pain.   alum & mag hydroxide-simeth 200-200-20 MG/5ML suspension Commonly known as: MAALOX/MYLANTA Place 30 mLs into feeding tube every 4 (four) hours as needed for indigestion.   chlorproMAZINE 10 MG tablet Commonly known as: THORAZINE Take 0.5 tablets (5 mg total) by mouth 3 (three) times daily as needed for hiccoughs.   feeding supplement (PROSource TF) liquid Place 45 mLs into feeding tube 2 (two) times daily.   feeding supplement (GLUCERNA 1.5 CAL) Liqd At 80 cc/hr from 6 pm to 4 am.   ferrous sulfate 300  (60 Fe) MG/5ML syrup Place 5 mLs (300 mg total) into feeding tube every other day. Start taking on: September 01, 2021   free water Soln Place 150 mLs into feeding tube every 4 (four) hours.   gabapentin 250 MG/5ML solution Commonly known as: NEURONTIN Place 2 mLs (100 mg total) into feeding tube every 8 (eight) hours.   insulin aspart 100 UNIT/ML injection Commonly known as: novoLOG Inject 0-9 Units into the skin every 4 (four) hours.   megestrol 400 MG/10ML suspension Commonly known as: MEGACE Place 10 mLs (400 mg total) into feeding tube 2 (two) times daily.   melatonin 3 MG Tabs tablet Place 2 tablets (6 mg total) into feeding tube at bedtime as needed. What changed:  when to take this reasons to take this   mouth rinse Liqd solution 15 mLs by Mouth Rinse route 2 times daily at 12 noon and  4 pm.   pantoprazole sodium 40 mg Commonly known as: PROTONIX Place 40 mg into feeding tube daily.   phenol 1.4 % Liqd Commonly known as: CHLORASEPTIC Use as directed 1 spray in the mouth or throat as needed for throat irritation / pain.   polyethylene glycol 17 g packet Commonly known as: MIRALAX / GLYCOLAX Place 17 g into feeding tube daily as needed for mild constipation.   prochlorperazine 5 MG tablet Commonly known as: COMPAZINE Place 1-2 tablets (5-10 mg total) into feeding tube every 6 (six) hours as needed for nausea.   testosterone 50 MG/5GM (1%) Gel Commonly known as: ANDROGEL Place 10 g onto the skin daily. Start taking on: September 01, 2021 Replaces: Testosterone 20.25 MG/ACT (1.62%) Gel         Contact information for follow-up providers     [name], MD Follow up.   Specialty: Internal Medicine Why: Call in 1-2 days for post hospital follow up Contact information: [place] [address] [phone]         Charlett Blake, MD Follow up.   Specialty: Physical Medicine and Rehabilitation Why: office will call you with follow up  appointment Contact information: [address]  [address] [phone]         Eulogio Bear, MD. Schedule an appointment as soon as possible for a visit.   Specialty: Surgical Oncology Why: For follow up this Friday or next Friday (message left with Dr. Hyman Hopes) Contact information: [place] [address] [phone]              Contact information for after-discharge care     Destination     [facility] Preferred SNF .   Service: Skilled Nursing Contact information: [place] [phone]                     Signed: Bary Leriche 08/31/2021, 1:35 PM

## 2021-08-31 NOTE — Progress Notes (Signed)
Inpatient Rehabilitation Discharge Medication Review by a Pharmacist  A complete drug regimen review was completed for this patient to identify any potential clinically significant medication issues.  High Risk Drug Classes Is patient taking? Indication by Medication  Antipsychotic Yes Thorazine- hiccoughs Compazine- Nausea  Anticoagulant No   Antibiotic No   Opioid No   Antiplatelet No   Hypoglycemics/insulin Yes Insulin: diabetes  Vasoactive Medication No   Chemotherapy No   Other Yes Melatonin: PRN sleep Pantoprazole: GERD ppx Testosterone: hormone replacement     Type of Medication Issue Identified Description of Issue Recommendation(s)  Drug Interaction(s) (clinically significant)     Duplicate Therapy     Allergy     No Medication Administration End Date     Incorrect Dose     Additional Drug Therapy Needed     Significant med changes from prior encounter (inform family/care partners about these prior to discharge).    Other       Clinically significant medication issues were identified that warrant physician communication and completion of prescribed/recommended actions by midnight of the next day:  No    Time spent performing this drug regimen review (minutes): 30   Thank you for allowing pharmacy to be a part of this patient's care.  Vaughan Basta BS, PharmD, BCPS Clinical Pharmacist 08/31/2021 12:13 PM  Contact: [phone] after 3 PM  "Be curious, not judgmental..." -Jamal Maes

## 2021-08-31 NOTE — Progress Notes (Addendum)
Occupational Therapy Session Note  Patient Details  Name: William Barron MRN: [id number] Date of Birth: 01-Feb-1946  Today's Date: 08/31/2021 OT Individual Time: 1031-1116 session 1 OT Individual Time Calculation (min): 45 min  Session 2: 7782-4235   Short Term Goals: Week 2:  OT Short Term Goal 1 (Week 2): LTG=STG  Skilled Therapeutic Interventions/Progress Updates:  Session 1: Pt greeted supine in bed reporting bad evening but agreeable to OT intervention. Session focus on BADL reeducation,DC assessments as indicated below,  functional mobility and decreasing overall caregiver burden.      First completed necessary DC assessments as indicated below, pt with noted reflux during session, checked with RN on reflux meds with RN waiting on meds from pharmacy. Pt then reprots need to void bowel, MOD I for supine>sit and CGA for stand pivot transfer to w/c per pt request d/t fatigue.  Of note pt did try to do a stand pivot with one LE off ground, cues needs to pivot on both feet. Total A transport into bathroom, CGA for toilet transfer with use of grab bars for increased safety d/t fatigue. Pt needed set- up for posterior pericare via lateral leans with +large BM. Of note pt asking same questions that he asked yesterday with no awareness. Pt also noted to don shoes with no socks even though pt has always worn socks with shoes, pt reporting "I just dont want them today." Pt completed LB dressing with MIN A d/t general malaise today. Pt donned shoes with set- up assist.  pt left seated in w/c with alarm activated and all needs within reach.    Session 2:                    Pt greeted supine in bed reporting fatigue but agreeable to OT intervention. Session focus on BADL reeducation, functional mobility, dynamic standing balance, BUE strength/endurance and decreasing overall caregiver burden.             Pt completed supine>sit with use of bed features and CGA, CGA for squat pivot transfer from  EOB>w/c d/t increased fatigue today. Pt completed seated oral care at sink MOD I. Pt requested to return to bed d/t increased hiccups.  Pt able to use urinal from bed level with set- up assist, per pt request. Pt with various questions about next level of care and amount of therapy at facility, pt thinks he heard that SNF only provides therapy 5x a week.  Issued pt HEP for DC, pt completed below therex with level 3 theraband:  X10 shoulder flexion  X10 bicep curls X10 shoulder horizontal ABD X10 shoulder diagonal pulls X10 shoulder extension  X10 alternating punches X10 bilateral shoulder external rotation  Pt left supine in bed with bed alarm activated and all needs within reach.                   Therapy Documentation Precautions:  Precautions Precautions: Fall, Other (comment) Precaution Comments: GJ tube, orthostatic tremors, orthostatic vitals Restrictions Weight Bearing Restrictions: No  Pain: Unrated pain from reflux, RN aware and providing meds at end of session. Utilized repositioning as needed for pain mgmt. ADL: ADL Eating: Set up Grooming: Setup Where Assessed-Grooming: Sitting at sink Where Assessed-Upper Body Bathing: Sitting at sink Lower Body Bathing: Minimal assistance Where Assessed-Lower Body Bathing: Sitting at sink Upper Body Dressing: Setup Where Assessed-Upper Body Dressing: Sitting at sink Lower Body Dressing: Minimal assistance Where Assessed-Lower Body Dressing: Sitting at sink Toileting: Maximal assistance Where  Assessed-Toileting: Glass blower/designer: Psychiatric nurse Method: Counselling psychologist: Energy manager: Not assessed Vision Baseline Vision/History: 1 Wears glasses Patient Visual Report: No change from baseline;Other (comment) (pt with impaired R eye alignment at baseline and frequently closes R eye when trying to focus his vision) Perception  Perception: Within Functional  Limits Praxis Praxis: Intact Balance   Exercises:   Other Treatments:   Cognition Cognition Overall Cognitive Status: Within Functional Limits for tasks assessed Arousal/Alertness: Awake/alert Orientation Level: Person;Place;Situation Person: Oriented Place: Oriented Situation: Oriented Memory: Appears intact Attention: Selective;Sustained Focused Attention: Appears intact Sustained Attention: Appears intact Selective Attention: Appears intact Awareness: Appears intact Problem Solving: Appears intact Safety/Judgment: Appears intact Brief Interview for Mental Status (BIMS) Repetition of Three Words (First Attempt): 3 Temporal Orientation: Year: Correct Temporal Orientation: Month: Accurate within 5 days Temporal Orientation: Day: Correct Recall: "Sock": Yes, no cue required Recall: "Blue": Yes, no cue required Recall: "Bed": Yes, no cue required BIMS Summary Score: 15   Therapy/Group: Individual Therapy  [name] 08/31/2021, 12:20 PM

## 2021-08-31 NOTE — Progress Notes (Addendum)
PROGRESS NOTE   Subjective/Complaints:  Hiccups have kept pt awake, no abd pain , no nausea or vomiting, some reflux  noted  ROS: Patient denies CP, SOB, N/V/D  Objective:   No results found. Recent Labs    08/31/21 0604  WBC 7.6  HGB 8.8*  HCT 28.5*  PLT 461*     Recent Labs    08/31/21 0604  NA 137  K 3.3*  CL 104  CO2 24  GLUCOSE 114*  BUN 14  CREATININE 1.07  CALCIUM 8.3*      Intake/Output Summary (Last 24 hours) at 08/31/2021 1040 Last data filed at 08/30/2021 2211 Gross per 24 hour  Intake 720 ml  Output 225 ml  Net 495 ml         Physical Exam: Vital Signs Blood pressure (!) 122/54, pulse 87, temperature (!) 97.5 F (36.4 C), resp. rate 18, height '5\' 5"'  (1.651 m), weight 51.7 kg, SpO2 100 %.    General: No acute distress, mood and affect are appropriate Heart: Regular rate and rhythm without rubs murmurs or additional heart sounds Lungs: CTA bilaterally breathing is normal and unlabored, no rales or wheezes Abdomen; bowel bowel sounds positive x4, nontender to palpation, nondistended, JG site CDI Extremities: No clubbing, cyanosis or edema Skin: No evidence of breakdown or rash, left forearm wrapped Psych: Pleasant and cooperative Neurologic: Cranial nerves II through XII intact, motor strength is 4+/5 in bilateral deltoid, bicep, tricep, grip, hip flexor, knee extensors, ankle dorsiflexor, and plantar flexor Right CN VI is still present.  HOH Musculoskeletal: FROM in all 4 extremities.  No joint swelling   Assessment/Plan: 1. Functional deficits which require 3+ hours per day of interdisciplinary therapy in a comprehensive inpatient rehab setting. Physiatrist is providing close team supervision and 24 hour management of active medical problems listed below. Physiatrist and rehab team continue to assess barriers to discharge/monitor patient progress toward functional and medical  goals  Care Tool:  Bathing    Body parts bathed by patient: Right arm, Left arm, Chest, Abdomen, Front perineal area, Buttocks, Right upper leg, Left upper leg, Right lower leg, Left lower leg, Face   Body parts bathed by helper: Buttocks     Bathing assist Assist Level: Supervision/Verbal cueing     Upper Body Dressing/Undressing Upper body dressing   What is the patient wearing?: Pull over shirt    Upper body assist Assist Level: Independent with assistive device    Lower Body Dressing/Undressing Lower body dressing      What is the patient wearing?: Underwear/pull up, Pants     Lower body assist Assist for lower body dressing: Contact Guard/Touching assist     Toileting Toileting    Toileting assist Assist for toileting: Maximal Assistance - Patient 25 - 49%     Transfers Chair/bed transfer  Transfers assist     Chair/bed transfer assist level: Contact Guard/Touching assist Chair/bed transfer assistive device: Armrests   Locomotion Ambulation   Ambulation assist      Assist level: Contact Guard/Touching assist Assistive device: Walker-rolling Max distance: 120   Walk 10 feet activity   Assist     Assist level: Contact Guard/Touching assist Assistive  device: Walker-rolling   Walk 50 feet activity   Assist Walk 50 feet with 2 turns activity did not occur: Safety/medical concerns  Assist level: Contact Guard/Touching assist Assistive device: Walker-rolling    Walk 150 feet activity   Assist Walk 150 feet activity did not occur: Safety/medical concerns         Walk 10 feet on uneven surface  activity   Assist Walk 10 feet on uneven surfaces activity did not occur: Safety/medical concerns         Wheelchair     Assist Is the patient using a wheelchair?: Yes Type of Wheelchair: Manual    Wheelchair assist level: Supervision/Verbal cueing Max wheelchair distance: 100    Wheelchair 50 feet with 2 turns  activity    Assist        Assist Level: Supervision/Verbal cueing   Wheelchair 150 feet activity     Assist      Assist Level: Dependent - Patient 0%   Blood pressure (!) 122/54, pulse 87, temperature (!) 97.5 F (36.4 C), resp. rate 18, height '5\' 5"'  (1.651 m), weight 51.7 kg, SpO2 100 %.  Medical Problem List and Plan: 1. Functional deficits secondary to Debility s/p Whipple procedure 07/13/21             -patient may shower cover J tube             -may d/c today to SNF if they get glucerna in stock  -Continue CIR therapies including PT, OT   2.  Antithrombotics: -DVT/anticoagulation:  Pharmaceutical: Lovenox             -antiplatelet therapy: N/a 3. Pain Management:  oxycodone prn.  4. Mood: LCSW to follow for evaluation and support.              -antipsychotic agents: N/A 5. Neuropsych: This patient is capable of making decisions on his own behalf. 6. Skin/Wound Care: Monitor wound for healing.  7. Fluids/Electrolytes/Nutrition: Monitor I/O.   Borderline low K+ 3.3 will supplement 34mq KCL liquid x 2 doses may recheck BMET next wk , should improve as intake increased  8.Whipple for IPMN (Dr. NHyman Hopes: On tube feeds via J tube.            Soft diet  Tolerating q24 h clamp 6/19 tolerating clamp, for 24hrs with 30 min to unplugged per day , irrigate 524mTID  9. Candidal esophagitis: On Fluconazole X 10 days-->>ended 06/09  10. T2DM: Hgb A1C-             --continue Semglee 6 units with novolog prn every 4 hours          CBG (last 3)  Recent Labs    08/30/21 2345 08/31/21 0357 08/31/21 0815  GLUCAP 177* 161* 152*    Controlled 6/19 11. Amyloidosis: chronic right CN VI palsy 12. ESBL Kleb/Citrobacter: Asymptomatic bacteruria being monitored.  13.  Leucocytosis: Resolved see below  14. Prostate cancer s/p XRT: On hormone replacement therapy  15. Iron deficiency/ABLA: On iron supplement. HGB 8.0 Monitor     Latest Ref Rng & Units 08/31/2021    6:04 AM  08/24/2021    6:03 AM 08/20/2021    5:51 AM  CBC  WBC 4.0 - 10.5 K/uL 7.6  6.0  9.6   Hemoglobin 13.0 - 17.0 g/dL 8.8  8.2  8.6   Hematocrit 39.0 - 52.0 % 28.5  25.9  27.2   Platelets 150 - 400 K/uL 461  539  615  6/12 Hgb stable  6/17 Hgb stable at 8.2  14. Tachycardia: On low dose BB.  Noted to have frequent PVCs. He had prolonged QT that resolved with electrolyte repletion. Vitals:   08/30/21 1940 08/31/21 0358  BP: (!) 118/55 (!) 122/54  Pulse: 81 87  Resp: 16 18  Temp: 98.5 F (36.9 C) (!) 97.5 F (36.4 C)  SpO2: 99% 100%   Occ lower BPs will need  16. Multiple myeloma. Ixazomib was noted on d/c meds from [name], 12m Dexamethazone weekly, held during recent admission, f/u with Onc  17. Right Parotid gland lesion increasing in size since 2019 . Will need ENT f/u, pt states he was unaware of this finding   19.  Dispo to SNF today or tomorrow (if TF not available today )  Will see Dr NHyman Hopeseither 6/23 or 6/30 his office will contact Mrs STovia [place] 5/18 will need to keep at least until 6/29 (6wks) but Dr NHyman Hopeswill take out in office once po intake improved  LOS: 12 days A FACE TO FGriswoldE Maryse Brierley 08/31/2021, 10:40 AM

## 2021-09-01 NOTE — Progress Notes (Signed)
Inpatient Rehabilitation Care Coordinator Discharge Note   Patient Details  Name: William Barron MRN: [id number] Date of Birth: 09/26/1945   Discharge location: D/c to SNF[facility]  Length of Stay: 12 days  Discharge activity level: Min A  Home/community participation: Limited  Patient response YY:TKPTWS Literacy - How often do you need to have someone help you when you read instructions, pamphlets, or other written material from your doctor or pharmacy?: Never  Patient response FK:CLEXNT Isolation - How often do you feel lonely or isolated from those around you?: Never  Services provided included: MD, RD, PT, OT, SLP, RN, CM, TR, Neuropsych, SW, Pharmacy  Financial Services:  Financial Services Utilized: Medicare    Choices offered to/list presented to: Yes  Follow-up services arranged:      Patient response to transportation need: Is the patient able to respond to transportation needs?: Yes In the past 12 months, has lack of transportation kept you from medical appointments or from getting medications?: No In the past 12 months, has lack of transportation kept you from meetings, work, or from getting things needed for daily living?: No    Comments (or additional information):  Patient/Family verbalized understanding of follow-up arrangements:  Yes  Individual responsible for coordination of the follow-up plan: contact pt or pt wife  Confirmed correct DME delivered: Rana Snare 09/01/2021    Rana Snare

## 2021-09-03 ENCOUNTER — Emergency Department (HOSPITAL_COMMUNITY): Payer: Medicare Other

## 2021-09-03 ENCOUNTER — Other Ambulatory Visit: Payer: Self-pay

## 2021-09-03 ENCOUNTER — Encounter (HOSPITAL_COMMUNITY): Payer: Self-pay | Admitting: Emergency Medicine

## 2021-09-03 ENCOUNTER — Inpatient Hospital Stay (HOSPITAL_COMMUNITY)
Admission: EM | Admit: 2021-09-03 | Discharge: 2021-09-10 | DRG: 919 | Disposition: A | Discharge status: Home / Self Care | Payer: Medicare Other | Attending: Internal Medicine | Admitting: Internal Medicine

## 2021-09-03 DIAGNOSIS — E43 Unspecified severe protein-calorie malnutrition: Secondary | ICD-10-CM | POA: Diagnosis present

## 2021-09-03 DIAGNOSIS — Z87891 Personal history of nicotine dependence: Secondary | ICD-10-CM

## 2021-09-03 DIAGNOSIS — R935 Abnormal findings on diagnostic imaging of other abdominal regions, including retroperitoneum: Secondary | ICD-10-CM

## 2021-09-03 DIAGNOSIS — I2510 Atherosclerotic heart disease of native coronary artery without angina pectoris: Secondary | ICD-10-CM | POA: Diagnosis present

## 2021-09-03 DIAGNOSIS — Z9049 Acquired absence of other specified parts of digestive tract: Secondary | ICD-10-CM

## 2021-09-03 DIAGNOSIS — L89301 Pressure ulcer of unspecified buttock, stage 1: Secondary | ICD-10-CM | POA: Diagnosis present

## 2021-09-03 DIAGNOSIS — H4920 Sixth [abducent] nerve palsy, unspecified eye: Secondary | ICD-10-CM | POA: Diagnosis present

## 2021-09-03 DIAGNOSIS — G250 Essential tremor: Secondary | ICD-10-CM | POA: Diagnosis present

## 2021-09-03 DIAGNOSIS — K6389 Other specified diseases of intestine: Secondary | ICD-10-CM | POA: Diagnosis present

## 2021-09-03 DIAGNOSIS — Z974 Presence of external hearing-aid: Secondary | ICD-10-CM

## 2021-09-03 DIAGNOSIS — E876 Hypokalemia: Secondary | ICD-10-CM | POA: Diagnosis present

## 2021-09-03 DIAGNOSIS — Z79818 Long term (current) use of other agents affecting estrogen receptors and estrogen levels: Secondary | ICD-10-CM

## 2021-09-03 DIAGNOSIS — E8589 Other amyloidosis: Secondary | ICD-10-CM | POA: Diagnosis not present

## 2021-09-03 DIAGNOSIS — R1111 Vomiting without nausea: Secondary | ICD-10-CM

## 2021-09-03 DIAGNOSIS — Z66 Do not resuscitate: Secondary | ICD-10-CM | POA: Diagnosis present

## 2021-09-03 DIAGNOSIS — Z8546 Personal history of malignant neoplasm of prostate: Secondary | ICD-10-CM

## 2021-09-03 DIAGNOSIS — Z794 Long term (current) use of insulin: Secondary | ICD-10-CM

## 2021-09-03 DIAGNOSIS — Z800 Family history of malignant neoplasm of digestive organs: Secondary | ICD-10-CM

## 2021-09-03 DIAGNOSIS — K219 Gastro-esophageal reflux disease without esophagitis: Secondary | ICD-10-CM | POA: Diagnosis present

## 2021-09-03 DIAGNOSIS — Z79899 Other long term (current) drug therapy: Secondary | ICD-10-CM

## 2021-09-03 DIAGNOSIS — R197 Diarrhea, unspecified: Secondary | ICD-10-CM | POA: Diagnosis not present

## 2021-09-03 DIAGNOSIS — Z8249 Family history of ischemic heart disease and other diseases of the circulatory system: Secondary | ICD-10-CM

## 2021-09-03 DIAGNOSIS — R112 Nausea with vomiting, unspecified: Secondary | ICD-10-CM

## 2021-09-03 DIAGNOSIS — Z79890 Hormone replacement therapy (postmenopausal): Secondary | ICD-10-CM

## 2021-09-03 DIAGNOSIS — Z981 Arthrodesis status: Secondary | ICD-10-CM

## 2021-09-03 DIAGNOSIS — T85528A Displacement of other gastrointestinal prosthetic devices, implants and grafts, initial encounter: Principal | ICD-10-CM | POA: Diagnosis present

## 2021-09-03 DIAGNOSIS — E859 Amyloidosis, unspecified: Secondary | ICD-10-CM | POA: Diagnosis present

## 2021-09-03 DIAGNOSIS — E1143 Type 2 diabetes mellitus with diabetic autonomic (poly)neuropathy: Secondary | ICD-10-CM | POA: Diagnosis present

## 2021-09-03 DIAGNOSIS — R1110 Vomiting, unspecified: Secondary | ICD-10-CM | POA: Diagnosis present

## 2021-09-03 DIAGNOSIS — R066 Hiccough: Secondary | ICD-10-CM | POA: Diagnosis not present

## 2021-09-03 DIAGNOSIS — C61 Malignant neoplasm of prostate: Secondary | ICD-10-CM | POA: Diagnosis present

## 2021-09-03 DIAGNOSIS — K30 Functional dyspepsia: Secondary | ICD-10-CM | POA: Diagnosis present

## 2021-09-03 DIAGNOSIS — Z681 Body mass index (BMI) 19 or less, adult: Secondary | ICD-10-CM

## 2021-09-03 DIAGNOSIS — Z880 Allergy status to penicillin: Secondary | ICD-10-CM

## 2021-09-03 DIAGNOSIS — E119 Type 2 diabetes mellitus without complications: Secondary | ICD-10-CM

## 2021-09-03 DIAGNOSIS — F32A Depression, unspecified: Secondary | ICD-10-CM | POA: Diagnosis present

## 2021-09-03 DIAGNOSIS — F418 Other specified anxiety disorders: Secondary | ICD-10-CM | POA: Diagnosis present

## 2021-09-03 DIAGNOSIS — Z923 Personal history of irradiation: Secondary | ICD-10-CM

## 2021-09-03 DIAGNOSIS — F419 Anxiety disorder, unspecified: Secondary | ICD-10-CM | POA: Diagnosis present

## 2021-09-03 DIAGNOSIS — Z8042 Family history of malignant neoplasm of prostate: Secondary | ICD-10-CM

## 2021-09-03 DIAGNOSIS — Z90410 Acquired total absence of pancreas: Secondary | ICD-10-CM

## 2021-09-03 DIAGNOSIS — Z7189 Other specified counseling: Secondary | ICD-10-CM

## 2021-09-03 DIAGNOSIS — H9190 Unspecified hearing loss, unspecified ear: Secondary | ICD-10-CM | POA: Diagnosis present

## 2021-09-03 DIAGNOSIS — Z85828 Personal history of other malignant neoplasm of skin: Secondary | ICD-10-CM

## 2021-09-03 DIAGNOSIS — Y848 Other medical procedures as the cause of abnormal reaction of the patient, or of later complication, without mention of misadventure at the time of the procedure: Secondary | ICD-10-CM | POA: Diagnosis present

## 2021-09-03 DIAGNOSIS — R590 Localized enlarged lymph nodes: Secondary | ICD-10-CM | POA: Diagnosis present

## 2021-09-03 DIAGNOSIS — D638 Anemia in other chronic diseases classified elsewhere: Secondary | ICD-10-CM | POA: Diagnosis present

## 2021-09-03 DIAGNOSIS — Z90411 Acquired partial absence of pancreas: Secondary | ICD-10-CM

## 2021-09-03 DIAGNOSIS — C9000 Multiple myeloma not having achieved remission: Secondary | ICD-10-CM | POA: Diagnosis present

## 2021-09-03 DIAGNOSIS — L8990 Pressure ulcer of unspecified site, unspecified stage: Secondary | ICD-10-CM | POA: Insufficient documentation

## 2021-09-03 DIAGNOSIS — E785 Hyperlipidemia, unspecified: Secondary | ICD-10-CM | POA: Diagnosis present

## 2021-09-03 DIAGNOSIS — K3184 Gastroparesis: Secondary | ICD-10-CM | POA: Diagnosis present

## 2021-09-03 LAB — COMPREHENSIVE METABOLIC PANEL
ALT: 46 U/L — ABNORMAL HIGH (ref 0–44)
AST: 31 U/L (ref 15–41)
Albumin: 2.6 g/dL — ABNORMAL LOW (ref 3.5–5.0)
Alkaline Phosphatase: 614 U/L — ABNORMAL HIGH (ref 38–126)
Anion gap: 13 (ref 5–15)
BUN: 18 mg/dL (ref 8–23)
CO2: 23 mmol/L (ref 22–32)
Calcium: 8.9 mg/dL (ref 8.9–10.3)
Chloride: 103 mmol/L (ref 98–111)
Creatinine, Ser: 1.03 mg/dL (ref 0.61–1.24)
GFR, Estimated: >60 mL/min (ref >60)
Glucose, Bld: 312 mg/dL — ABNORMAL HIGH (ref 70–99)
Potassium: 4.6 mmol/L (ref 3.5–5.1)
Sodium: 139 mmol/L (ref 135–145)
Total Bilirubin: 0.4 mg/dL (ref 0.3–1.2)
Total Protein: 5.6 g/dL — ABNORMAL LOW (ref 6.5–8.1)

## 2021-09-03 LAB — LACTIC ACID, PLASMA
Lactic Acid, Venous: 2.9 mmol/L (ref 0.5–1.9)
Lactic Acid, Venous: 1.9 mmol/L (ref 0.5–1.9)

## 2021-09-03 LAB — CBC
HCT: 33.5 % — ABNORMAL LOW (ref 39.0–52.0)
Hemoglobin: 10.6 g/dL — ABNORMAL LOW (ref 13.0–17.0)
MCH: 27.7 pg (ref 26.0–34.0)
MCHC: 31.6 g/dL (ref 30.0–36.0)
MCV: 87.5 fL (ref 80.0–100.0)
Platelets: 466 10*3/uL — ABNORMAL HIGH (ref 150–400)
RBC: 3.83 MIL/uL — ABNORMAL LOW (ref 4.22–5.81)
RDW: 17.1 % — ABNORMAL HIGH (ref 11.5–15.5)
WBC: 11.2 10*3/uL — ABNORMAL HIGH (ref 4.0–10.5)
nRBC: 0 % (ref 0.0–0.2)

## 2021-09-03 LAB — LIPASE, BLOOD: Lipase: 17 U/L (ref 11–51)

## 2021-09-03 LAB — PROTIME-INR
INR: 1.2 (ref 0.8–1.2)
Prothrombin Time: 14.8 seconds (ref 11.4–15.2)

## 2021-09-03 LAB — GLUCOSE, CAPILLARY: Glucose-Capillary: 165 mg/dL — ABNORMAL HIGH (ref 70–99)

## 2021-09-03 LAB — CBG MONITORING, ED
Glucose-Capillary: 312 mg/dL — ABNORMAL HIGH (ref 70–99)
Glucose-Capillary: 239 mg/dL — ABNORMAL HIGH (ref 70–99)

## 2021-09-03 LAB — TROPONIN I (HIGH SENSITIVITY)
Troponin I (High Sensitivity): 55 ng/L — ABNORMAL HIGH (ref <18)
Troponin I (High Sensitivity): 39 ng/L — ABNORMAL HIGH (ref <18)

## 2021-09-03 MED ORDER — ACETAMINOPHEN 650 MG RE SUPP: 650.0000 mg | Freq: Four times a day (QID) | RECTAL | Status: DC | PRN

## 2021-09-03 MED ORDER — GABAPENTIN 250 MG/5ML PO SOLN
100.0000 mg | Freq: Three times a day (TID) | ORAL | Status: DC
Start: 2021-09-03 — End: 2021-09-10
  Administered 2021-09-03 – 2021-09-09 (×15): 100 mg
  Filled 2021-09-03 (×21): qty 2

## 2021-09-03 MED ORDER — PANTOPRAZOLE SODIUM 40 MG IV SOLR: 40.0000 mg | INTRAVENOUS | Status: DC

## 2021-09-03 MED ORDER — PROCHLORPERAZINE MALEATE 5 MG PO TABS: 5.0000 mg | ORAL_TABLET | Freq: Four times a day (QID) | ORAL | Status: DC | PRN

## 2021-09-03 MED ORDER — LACTATED RINGERS IV SOLN: INTRAVENOUS | Status: AC

## 2021-09-03 MED ORDER — BISACODYL 5 MG PO TBEC: 5.0000 mg | DELAYED_RELEASE_TABLET | Freq: Every day | ORAL | Status: DC | PRN

## 2021-09-03 MED ORDER — PIPERACILLIN-TAZOBACTAM 3.375 G IVPB
3.3750 g | Freq: Three times a day (TID) | INTRAVENOUS | Status: DC
  Administered 2021-09-04 (×2): 3.375 g via INTRAVENOUS
  Filled 2021-09-03 (×2): qty 50

## 2021-09-03 MED ORDER — ACETAMINOPHEN 325 MG PO TABS: 650.0000 mg | ORAL_TABLET | Freq: Four times a day (QID) | ORAL | Status: DC | PRN

## 2021-09-03 MED ORDER — SODIUM CHLORIDE 0.9 % IV SOLN: Freq: Once | INTRAVENOUS | Status: AC

## 2021-09-03 MED ORDER — DIPHENHYDRAMINE HCL 50 MG/ML IJ SOLN
25.0000 mg | Freq: Once | INTRAMUSCULAR | Status: AC
  Administered 2021-09-03: 25 mg via INTRAVENOUS
  Filled 2021-09-03: qty 1

## 2021-09-03 MED ORDER — ORAL CARE MOUTH RINSE
15.0000 mL | OROMUCOSAL | Status: DC
  Administered 2021-09-03 – 2021-09-09 (×20): 15 mL via OROMUCOSAL

## 2021-09-03 MED ORDER — DIPHENHYDRAMINE HCL 25 MG PO TABS: 12.5000 mg | ORAL_TABLET | Freq: Four times a day (QID) | ORAL | 0 refills | Status: DC | PRN

## 2021-09-03 MED ORDER — HYDRALAZINE HCL 20 MG/ML IJ SOLN: 5.0000 mg | INTRAMUSCULAR | Status: DC | PRN

## 2021-09-03 MED ORDER — ONDANSETRON HCL 4 MG/2ML IJ SOLN: 4.0000 mg | Freq: Four times a day (QID) | INTRAMUSCULAR | Status: DC | PRN

## 2021-09-03 MED ORDER — POLYETHYLENE GLYCOL 3350 17 G PO PACK: 17.0000 g | PACK | Freq: Every day | ORAL | Status: DC | PRN

## 2021-09-03 MED ORDER — CHLORPROMAZINE HCL 10 MG PO TABS
5.0000 mg | ORAL_TABLET | Freq: Three times a day (TID) | ORAL | Status: DC | PRN
Start: 2021-09-03 — End: 2021-09-10

## 2021-09-03 MED ORDER — OXYCODONE HCL 5 MG PO TABS: 5.0000 mg | ORAL_TABLET | ORAL | Status: DC | PRN

## 2021-09-03 MED ORDER — ONDANSETRON HCL 4 MG PO TABS: 4.0000 mg | ORAL_TABLET | Freq: Four times a day (QID) | ORAL | Status: DC | PRN

## 2021-09-03 MED ORDER — PROCHLORPERAZINE EDISYLATE 10 MG/2ML IJ SOLN
10.0000 mg | Freq: Once | INTRAMUSCULAR | Status: AC
Start: 2021-09-03 — End: 2021-09-03
  Administered 2021-09-03: 10 mg via INTRAVENOUS
  Filled 2021-09-03: qty 2

## 2021-09-03 MED ORDER — PANTOPRAZOLE SODIUM 40 MG IV SOLR
40.0000 mg | Freq: Two times a day (BID) | INTRAVENOUS | Status: DC
  Administered 2021-09-04 – 2021-09-09 (×11): 40 mg via INTRAVENOUS
  Filled 2021-09-03 (×11): qty 10

## 2021-09-03 MED ORDER — PIPERACILLIN-TAZOBACTAM 3.375 G IVPB 30 MIN
3.3750 g | Freq: Once | INTRAVENOUS | Status: AC
  Administered 2021-09-03: 3.375 g via INTRAVENOUS
  Filled 2021-09-03: qty 50

## 2021-09-03 MED ORDER — INSULIN ASPART 100 UNIT/ML IJ SOLN
0.0000 [IU] | Freq: Three times a day (TID) | INTRAMUSCULAR | Status: DC
  Administered 2021-09-03: 3 [IU] via SUBCUTANEOUS
  Administered 2021-09-04 – 2021-09-07 (×3): 1 [IU] via SUBCUTANEOUS

## 2021-09-03 MED ORDER — ORAL CARE MOUTH RINSE: 15.0000 mL | OROMUCOSAL | Status: DC | PRN

## 2021-09-03 MED ORDER — MELATONIN 3 MG PO TABS: 6.0000 mg | ORAL_TABLET | Freq: Every evening | ORAL | Status: DC | PRN

## 2021-09-03 MED ORDER — IOHEXOL 300 MG/ML  SOLN
100.0000 mL | Freq: Once | INTRAMUSCULAR | Status: AC | PRN
  Administered 2021-09-03: 100 mL via INTRAVENOUS

## 2021-09-03 MED ORDER — MORPHINE SULFATE (PF) 2 MG/ML IV SOLN: 2.0000 mg | INTRAVENOUS | Status: DC | PRN

## 2021-09-03 MED ORDER — FREE WATER
150.0000 mL | Status: DC
Start: 2021-09-03 — End: 2021-09-04
  Administered 2021-09-03 – 2021-09-04 (×3): 150 mL

## 2021-09-03 MED ORDER — ENOXAPARIN SODIUM 40 MG/0.4ML IJ SOSY
40.0000 mg | PREFILLED_SYRINGE | INTRAMUSCULAR | Status: DC
  Administered 2021-09-03 – 2021-09-08 (×6): 40 mg via SUBCUTANEOUS
  Filled 2021-09-03 (×6): qty 0.4

## 2021-09-03 NOTE — ED Provider Notes (Signed)
[facility]EMERGENCY DEPT [facility] Emergency Department Provider Note MRN:  [id number]  Arrival date & time: 09/03/21     Chief Complaint   Emesis   History of Present Illness   William Barron is a 76 y.o. year-old male with a history of diabetes, amyloidosis presenting to the ED with chief complaint of emesis.  Patient underwent Whipple surgery for pancreatic mass on May 1.  He has been having some persistent hiccups ever since surgery.  Explains that the hiccups were particularly bothersome this evening and caused an episode of emesis.  At his rehab facility, they noted the emesis to look a bit like coffee grounds and so he is here for evaluation.  Denies having any nausea or pain, explains that it was the hiccup reflux that triggered the vomiting.  Currently feels fine, still having occasional hiccups.  Review of Systems  A thorough review of systems was obtained and all systems are negative except as noted in the HPI and PMH.   Patient's Health History    Past Medical History:  Diagnosis Date   Amyloidosis (HCC)    Diabetes mellitus without complication (HCC)    GERD (gastroesophageal reflux disease)    Hearing loss    Has hearing aids   History of kidney stones    Hyperlipidemia    Multiple myeloma (HCC)    and amylodosis    Nephrolithiasis    Occasional tremors    Pneumonia    Prostate cancer (HCC)    Prostatitis    acute and chronic   Skin cancer     Past Surgical History:  Procedure Laterality Date   APPENDECTOMY     CARDIAC CATHETERIZATION N/A 06/13/2015   Procedure: Left Heart Cath and Coronary Angiography;  Surgeon: Corky Crafts, MD;  Location: [facility] INVASIVE CV LAB;  Service: Cardiovascular;  Laterality: N/A;   COLONOSCOPY  04/07/2000   ELBOW SURGERY  03/15/2001   right   EXTRACORPOREAL SHOCK WAVE LITHOTRIPSY Left 07/17/2018   Procedure: EXTRACORPOREAL SHOCK WAVE LITHOTRIPSY (ESWL);  Surgeon: Bjorn Pippin, MD;  Location: [facility] ORS;  Service: Urology;   Laterality: Left;   EXTRACORPOREAL SHOCK WAVE LITHOTRIPSY Right 12/08/2020   Procedure: RIGHT EXTRACORPOREAL SHOCK WAVE LITHOTRIPSY (ESWL);  Surgeon: Heloise Purpura, MD;  Location: [facility];  Service: Urology;  Laterality: Right;   FOOT ARTHRODESIS Right 04/15/2021   Procedure: RIGHT SUBTALAR AND TALONAVICULAR FUSION;  Surgeon: Nadara Mustard, MD;  Location[facility] OR;  Service: Orthopedics;  Laterality: Right;   HERNIA REPAIR     IR MECH REMOV OBSTRUC MAT ANY COLON TUBE W/FLUORO  08/27/2021   ROTATOR CUFF REPAIR     WHIPPLE PROCEDURE  07/13/2021    Family History  Problem Relation Age of Onset   Pancreatic cancer Father    Prostate cancer Father    Parkinsonism Mother    CAD Brother 32   Cancer Paternal Grandmother    Breast cancer Neg Hx    Colon cancer Neg Hx     Social History   Socioeconomic History   Marital status: Married    Spouse name: Not on file   Number of children: 2   Years of education: Not on file   Highest education level: Not on file  Occupational History   Occupation: retired  Tobacco Use   Smoking status: Former    Packs/day: 3.00    Years: 20.00    Total pack years: 60.00    Types: Cigarettes    Quit date: 03/15/1977  Years since quitting: 44.5   Smokeless tobacco: Never   Tobacco comments:    quit 35 years ago  Vaping Use   Vaping Use: Never used  Substance and Sexual Activity   Alcohol use: Yes    Alcohol/week: 3.0 standard drinks of alcohol    Types: 3 Glasses of wine per week    Comment: approx 1 drink/night   Drug use: No   Sexual activity: Not Currently    Birth control/protection: None  Other Topics Concern   Not on file  Social History Narrative   Married, 2 children   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 and [place] [employer]   1 drink/day   Social Determinants of Health   Financial Resource Strain: Not on file  Food Insecurity: Not on file  Transportation Needs: Not on file  Physical Activity: Not  on file  Stress: Not on file  Social Connections: Not on file  Intimate Partner Violence: Not on file     Physical Exam   Vitals:   09/03/21 0600 09/03/21 0700  BP: 135/81 134/79  Pulse: 96 (!) 104  Resp: (!) 22 17  SpO2: 98% 98%    CONSTITUTIONAL: Well-appearing, NAD NEURO/PSYCH:  Alert and oriented x 3, no focal deficits EYES:  eyes equal and reactive ENT/NECK:  no LAD, no JVD CARDIO: Regular rate, well-perfused, normal S1 and S2 PULM:  CTAB no wheezing or rhonchi GI/GU:  non-distended, non-tender MSK/SPINE:  No gross deformities, no edema SKIN:  no rash, atraumatic   *Additional and/or pertinent findings included in MDM below  Diagnostic and Interventional Summary    EKG Interpretation  Date/Time:  Thursday September 03 2021 04:08:25 EDT Ventricular Rate:  103 PR Interval:  158 QRS Duration: 88 QT Interval:  330 QTC Calculation: 432 R Axis:   11 Text Interpretation: Sinus tachycardia Probable left atrial enlargement Borderline repolarization abnormality Confirmed by Kennis Carina [phone]) on 09/03/2021 7:19:46 AM       Labs Reviewed  CBC - Abnormal; Notable for the following components:      Result Value   WBC 11.2 (*)    RBC 3.83 (*)    Hemoglobin 10.6 (*)    HCT 33.5 (*)    RDW 17.1 (*)    Platelets 466 (*)    All other components within normal limits  COMPREHENSIVE METABOLIC PANEL - Abnormal; Notable for the following components:   Glucose, Bld 312 (*)    Total Protein 5.6 (*)    Albumin 2.6 (*)    ALT 46 (*)    Alkaline Phosphatase 614 (*)    All other components within normal limits  CBG MONITORING, ED - Abnormal; Notable for the following components:   Glucose-Capillary 312 (*)    All other components within normal limits  TROPONIN I (HIGH SENSITIVITY) - Abnormal; Notable for the following components:   Troponin I (High Sensitivity) 55 (*)    All other components within normal limits  LIPASE, BLOOD  PROTIME-INR  TYPE AND SCREEN  TROPONIN I (HIGH  SENSITIVITY)    DG Chest Port 1 View  Final Result      Medications  prochlorperazine (COMPAZINE) injection 10 mg (10 mg Intravenous Given 09/03/21 0452)  diphenhydrAMINE (BENADRYL) injection 25 mg (25 mg Intravenous Given 09/03/21 0451)     Procedures  /  Critical Care Procedures  ED Course and Medical Decision Making  Initial Impression and Ddx Abdomen soft and nontender with no rebound guarding or rigidity, vital signs are normal with no fever.  Seems that patient had an episode of emesis in response to persistent hiccups, which has become more of a chronic problem for him ever since he had this Whipple surgery.  Given the report of coffee-ground emesis, GI bleeding is considered.  Will monitor for any further episodes, obtain labs, reassess.  Past medical/surgical history that increases complexity of ED encounter: Recent Whipple surgery  Interpretation of Diagnostics I personally reviewed the EKG and my interpretation is as follows: Sinus rhythm without concerning changes  Labs reassuring, normal H&H, electrolytes.  Troponin minimally elevated, awaiting repeat.  Patient Reassessment and Ultimate Disposition/Management     Signed out to oncoming provider  Patient management required discussion with the following services or consulting groups:  None  Complexity of Problems Addressed Acute illness or injury that poses threat of life of bodily function  Additional Data Reviewed and Analyzed Further history obtained from: None  Additional Factors Impacting ED Encounter Risk Consideration of hospitalization  Alvah Tuomi. Pilar Plate, MD [facility] Emergency Medicine [facility] [email or web address]  Final Clinical Impressions(s) / ED Diagnoses     ICD-10-CM   1. Vomiting without nausea, unspecified vomiting type  R11.11     2. Chronic hiccups  R06.6       ED Discharge Orders     None        Discharge Instructions Discussed with and Provided to Patient:    Discharge Instructions   None      Sabas Sous, MD 09/03/21 [phone]

## 2021-09-03 NOTE — Consult Note (Addendum)
[facility] Consult Note  William Barron 09/07/1945  [id number].    Requesting MD: Peter Messick Chief Complaint/Reason for Consult: pneumatosis  HPI:  William Barron is a 76 y.o. male who underwent Whipple on 07/13/21 by Dr. Nussbaum at [name] for a benign pancreatic head mass. Apparently had a prolonged postoperative course. He was discharged from [name] on 08/11/21 and subsequently was readmitted to [facility] 08/12/21 through 08/19/21 for feeding difficulties. From there he was discharged to [facility] on 08/19/21, and then went to SNF on 08/31/21. States that he has had some nausea and hiccups since surgery, but this morning he had an episode of emesis which was new. Denies any worsening abdominal pain. He had a normal BM this morning. SNF was concerned for coffee ground emesis so they brought him to the ED. He has been taking in tube feedings at night and just over the last week started to take more PO. In the ED patient has been mildly tachycardic HR 90-110s, otherwise vital signs stable. CT scan shows new pneumatosis within the small bowel within the right lower quadrant, but no associated bowel wall thickening or inflammatory change. WBC mildly elevated 11.2, lactic acid 2.9.  General surgery asked to see.   Family History  Problem Relation Age of Onset   Pancreatic cancer Father    Prostate cancer Father    Parkinsonism Mother    CAD Brother 60   Cancer Paternal Grandmother    Breast cancer Neg Hx    Colon cancer Neg Hx     Past Medical History:  Diagnosis Date   Amyloidosis (HCC)    Diabetes mellitus without complication (HCC)    GERD (gastroesophageal reflux disease)    Hearing loss    Has hearing aids   History of kidney stones    Hyperlipidemia    Multiple myeloma (HCC)    and amylodosis    Nephrolithiasis    Occasional tremors    Pneumonia    Prostate cancer (HCC)    Prostatitis    acute and chronic   Skin cancer     Past Surgical History:   Procedure Laterality Date   APPENDECTOMY     CARDIAC CATHETERIZATION N/A 06/13/2015   Procedure: Left Heart Cath and Coronary Angiography;  Surgeon: Jayadeep S Varanasi, MD;  Location: [facility];  Service: Cardiovascular;  Laterality: N/A;   COLONOSCOPY  04/07/2000   ELBOW SURGERY  03/15/2001   right   EXTRACORPOREAL SHOCK WAVE LITHOTRIPSY Left 07/17/2018   Procedure: EXTRACORPOREAL SHOCK WAVE LITHOTRIPSY (ESWL);  Surgeon: Wrenn, John, MD;  Location: [facility];  Service: Urology;  Laterality: Left;   EXTRACORPOREAL SHOCK WAVE LITHOTRIPSY Right 12/08/2020   Procedure: RIGHT EXTRACORPOREAL SHOCK WAVE LITHOTRIPSY (ESWL);  Surgeon: Borden, Lester, MD;  Location: [facility];  Service: Urology;  Laterality: Right;   FOOT ARTHRODESIS Right 04/15/2021   Procedure: RIGHT SUBTALAR AND TALONAVICULAR FUSION;  Surgeon: Duda, Marcus V, MD;  Location: [facility];  Service: Orthopedics;  Laterality: Right;   HERNIA REPAIR     IR MECH REMOV OBSTRUC MAT ANY COLON TUBE W/FLUORO  08/27/2021   ROTATOR CUFF REPAIR     WHIPPLE PROCEDURE  07/13/2021    Social History:  reports that he quit smoking about 44 years ago. His smoking use included cigarettes. He has a 60.00 pack-year smoking history. He has never used smokeless tobacco. He reports current alcohol use of about 3.0 standard drinks of alcohol per week. He reports that he does   not use drugs.  Allergies:  Allergies  Allergen Reactions   Penicillins Swelling and Rash    Pt received ancef on 04-15-2021 without issue    (Not in a hospital admission)   Prior to Admission medications   Medication Sig Start Date End Date Taking? Authorizing Provider  diphenhydrAMINE (BENADRYL) 25 MG tablet Take 0.5 tablets (12.5 mg total) by mouth every 6 (six) hours as needed (Hiccups). 09/03/21  Yes Valarie Merino, MD  acetaminophen (TYLENOL) 325 MG tablet Place 1-2 tablets (325-650 mg total) into feeding tube every 4 (four) hours as needed for mild  pain. 08/31/21   Love, Ivan Anchors, PA-C  alum & mag hydroxide-simeth (MAALOX/MYLANTA) 200-200-20 MG/5ML suspension Place 30 mLs into feeding tube every 4 (four) hours as needed for indigestion. 08/31/21   Love, Ivan Anchors, PA-C  chlorproMAZINE (THORAZINE) 10 MG tablet Take 0.5 tablets (5 mg total) by mouth 3 (three) times daily as needed for hiccoughs. 08/31/21   Love, Ivan Anchors, PA-C  ferrous sulfate 300 (60 Fe) MG/5ML syrup Place 5 mLs (300 mg total) into feeding tube every other day. 09/01/21   Love, Ivan Anchors, PA-C  gabapentin (NEURONTIN) 250 MG/5ML solution Place 2 mLs (100 mg total) into feeding tube every 8 (eight) hours. 08/31/21   Love, Ivan Anchors, PA-C  insulin aspart (NOVOLOG) 100 UNIT/ML injection Inject 0-9 Units into the skin every 4 (four) hours. 08/31/21   Love, Ivan Anchors, PA-C  megestrol (MEGACE) 400 MG/10ML suspension Place 10 mLs (400 mg total) into feeding tube 2 (two) times daily. 08/31/21   Love, Ivan Anchors, PA-C  melatonin 3 MG TABS tablet Place 2 tablets (6 mg total) into feeding tube at bedtime as needed. 08/31/21   Love, Ivan Anchors, PA-C  Mouthwashes (MOUTH RINSE) LIQD solution 15 mLs by Mouth Rinse route 2 times daily at 12 noon and 4 pm. 08/31/21   Love, Ivan Anchors, PA-C  Nutritional Supplements (FEEDING SUPPLEMENT, GLUCERNA 1.5 CAL,) LIQD At 80 cc/hr from 6 pm to 4 am. 08/31/21   Love, Ivan Anchors, PA-C  Nutritional Supplements (FEEDING SUPPLEMENT, PROSOURCE TF,) liquid Place 45 mLs into feeding tube 2 (two) times daily. 08/31/21   Love, Ivan Anchors, PA-C  pantoprazole sodium (PROTONIX) 40 mg Place 40 mg into feeding tube daily. 08/31/21   Love, Ivan Anchors, PA-C  phenol (CHLORASEPTIC) 1.4 % LIQD Use as directed 1 spray in the mouth or throat as needed for throat irritation / pain. 08/31/21   Love, Ivan Anchors, PA-C  polyethylene glycol (MIRALAX / GLYCOLAX) 17 g packet Place 17 g into feeding tube daily as needed for mild constipation. 08/31/21   Love, Ivan Anchors, PA-C  prochlorperazine (COMPAZINE) 5 MG tablet Place  1-2 tablets (5-10 mg total) into feeding tube every 6 (six) hours as needed for nausea. 08/31/21   Love, Ivan Anchors, PA-C  testosterone (ANDROGEL) 50 MG/5GM (1%) GEL Place 10 g onto the skin daily. 09/01/21   Love, Ivan Anchors, PA-C  Water For Irrigation, Sterile (FREE WATER) SOLN Place 150 mLs into feeding tube every 4 (four) hours. 08/31/21   Love, Ivan Anchors, PA-C    Blood pressure (!) 129/58, pulse 96, temperature 98 F (36.7 C), resp. rate (!) 21, SpO2 100 %. Physical Exam: General: frail elderly male HEENT: head is normocephalic, atraumatic.  Sclera are noninjected.  Pupils equal and round.  Ears and nose without any masses or lesions.  Mouth is dry Heart: regular rhythm, HR 90-110s  Lungs: CTAB, no wheezes, rhonchi, or rales noted.  Respiratory effort nonlabored on room air Abd: well healed midline incision, soft, ND, +BS, no masses, hernias, or organomegaly. GJ in the left abdomen. Minimal epigastric TTP, no rebound or guarding, no diffuse tenderness and no peritonitis MS: no BUE/BLE edema, calves soft and nontender Skin: warm and dry with no masses, lesions, or rashes   Results for orders placed or performed during the hospital encounter of 09/03/21 (from the past 48 hour(s))  CBG monitoring, ED     Status: Abnormal   Collection Time: 09/03/21  4:12 AM  Result Value Ref Range   Glucose-Capillary 312 (H) 70 - 99 mg/dL    Comment: Glucose reference range applies only to samples taken after fasting for at least 8 hours.  CBC     Status: Abnormal   Collection Time: 09/03/21  4:19 AM  Result Value Ref Range   WBC 11.2 (H) 4.0 - 10.5 K/uL   RBC 3.83 (L) 4.22 - 5.81 MIL/uL   Hemoglobin 10.6 (L) 13.0 - 17.0 g/dL   HCT 33.5 (L) 39.0 - 52.0 %   MCV 87.5 80.0 - 100.0 fL   MCH 27.7 26.0 - 34.0 pg   MCHC 31.6 30.0 - 36.0 g/dL   RDW 17.1 (H) 11.5 - 15.5 %   Platelets 466 (H) 150 - 400 K/uL   nRBC 0.0 0.0 - 0.2 %    Comment: Performed at [facility] [address], [place], [place]  [address]  Comprehensive metabolic panel     Status: Abnormal   Collection Time: 09/03/21  4:19 AM  Result Value Ref Range   Sodium 139 135 - 145 mmol/L   Potassium 4.6 3.5 - 5.1 mmol/L   Chloride 103 98 - 111 mmol/L   CO2 23 22 - 32 mmol/L   Glucose, Bld 312 (H) 70 - 99 mg/dL    Comment: Glucose reference range applies only to samples taken after fasting for at least 8 hours.   BUN 18 8 - 23 mg/dL   Creatinine, Ser 1.03 0.61 - 1.24 mg/dL   Calcium 8.9 8.9 - 10.3 mg/dL   Total Protein 5.6 (L) 6.5 - 8.1 g/dL   Albumin 2.6 (L) 3.5 - 5.0 g/dL   AST 31 15 - 41 U/L   ALT 46 (H) 0 - 44 U/L   Alkaline Phosphatase 614 (H) 38 - 126 U/L   Total Bilirubin 0.4 0.3 - 1.2 mg/dL   GFR, Estimated >60 >60 mL/min    Comment: (NOTE) Calculated using the CKD-EPI Creatinine Equation (2021)    Anion gap 13 5 - 15    Comment: Performed at [facility], [place] [address], [place], [place] [address]  Lipase, blood     Status: None   Collection Time: 09/03/21  4:19 AM  Result Value Ref Range   Lipase 17 11 - 51 U/L    Comment: Performed at [facility] [address], [place], [place] [address]  Protime-INR     Status: None   Collection Time: 09/03/21  4:19 AM  Result Value Ref Range   Prothrombin Time 14.8 11.4 - 15.2 seconds   INR 1.2 0.8 - 1.2    Comment: (NOTE) INR goal varies based on device and disease states. Performed at [facility], [place] [address] [place], [place] [address]   Type and screen [facility]     Status: None   Collection Time: 09/03/21  4:19 AM  Result Value Ref Range  ABO/RH(D) O POS    Antibody Screen POS    Sample Expiration 09/06/2021,2359    Antibody Identification      NON SPECIFIC ANTIBODY REACTIVITY Performed at [facility], [place] [address] [place], [place] [address]   Troponin I (High Sensitivity)     Status: Abnormal   Collection Time: 09/03/21  4:19 AM  Result Value Ref Range   Troponin I (High Sensitivity) 55 (H) <18  ng/L    Comment: (NOTE) Elevated high sensitivity troponin I (hsTnI) values and significant  changes across serial measurements may suggest ACS but many other  chronic and acute conditions are known to elevate hsTnI results.  Refer to the "Links" section for chest pain algorithms and additional  guidance. Performed at [facility], [place] [address], [place], [place] [address]   Troponin I (High Sensitivity)     Status: Abnormal   Collection Time: 09/03/21  7:35 AM  Result Value Ref Range   Troponin I (High Sensitivity) 39 (H) <18 ng/L    Comment: (NOTE) Elevated high sensitivity troponin I (hsTnI) values and significant  changes across serial measurements may suggest ACS but many other  chronic and acute conditions are known to elevate hsTnI results.  Refer to the "Links" section for chest pain algorithms and additional  guidance. Performed at [facility], [place] [address] [place], [place] [address]    CT ABDOMEN PELVIS W CONTRAST  Result Date: 09/03/2021 CLINICAL DATA:  Nausea and vomiting. EXAM: CT ABDOMEN AND PELVIS WITH CONTRAST TECHNIQUE: Multidetector CT imaging of the abdomen and pelvis was performed using the standard protocol following bolus administration of intravenous contrast. RADIATION DOSE REDUCTION: This exam was performed according to the departmental dose-optimization program which includes automated exposure control, adjustment of the mA and/or kV according to patient size and/or use of iterative reconstruction technique. CONTRAST:  134m OMNIPAQUE IOHEXOL 300 MG/ML  SOLN COMPARISON:  KUB 08/25/2021, CT abdomen and pelvis with contrast 08/11/2021 FINDINGS: Lower chest: There is bilateral posterior subpleural chronic interlobular septal thickening/scarring that is similar to prior. No pleural effusion. There is again bulky subcarinal and right paraesophageal lymphadenopathy with scattered calcifications. This measures up to approximately 3.7 cm in AP dimension  within the visualized portion, not significantly changed from 08/11/2021. There are again coarsened calcifications within the inferior aspect of the lingula. Heart size is mildly enlarged. Coronary artery calcifications are noted. Hepatobiliary: Smooth liver contours. Status post cholecystectomy unchanged minimal periportal edema versus mild intrahepatic biliary ductal dilatation. No focal liver lesion is seen. Pancreas: Postsurgical changes are again seen of resection of the pancreatic head. Extensive calcifications are again seen within the pancreatic body and tail. Spleen: Normal in size without focal abnormality. Adrenals/Urinary Tract: Normal bilateral adrenal glands. The kidneys enhance uniformly and are symmetric in size without hydronephrosis. There are tiny nonobstructing stones within the mid and lower pole of the left kidney measuring up to 4 mm within the left lower pole, unchanged. Subcentimeter low-attenuation lesions within the upper pole of the left kidney are too small to further characterize and remain unchanged from prior. No follow-up imaging is recommended. No hydronephrosis. No focal urinary bladder wall thickening. Stomach/Bowel: No focal bowel wall thickening. There is concern for pneumatosis within small bowel within the right lower quadrant (axial series 4, images 35 through 58, coronal series 7 images 28 through 69). The terminal ileum is unremarkable. The appendix is not confidently identified however no inflammatory changes are seen around the cecum to indicate secondary  signs of acute appendicitis. Note is made of prior Whipple procedure. Gastrojejunostomy tube is again seen with distal tip apparently extending through a gastrojejunal anastomosis just into the proximal small bowel. Extends less into the small bowel compared to the prior CT. Vascular/Lymphatic: Moderate to high-grade atherosclerotic calcifications. No abdominal aortic aneurysm. No pelvic or retroperitoneal or mesenteric  lymphadenopathy is seen. Reproductive: The prostate measures up to 6.1 cm in transverse dimension, moderately to markedly enlarged and unchanged from prior. The seminal vesicles are grossly unremarkable. Other: Midline abdominal postsurgical changes. Mild left inguinal fat and fluid containing hernia is unchanged. No free air or free fluid is seen within the abdomen or pelvis. Musculoskeletal: Grade 1 anterolisthesis of L4 on L5 is unchanged from prior likely secondary to degenerative facet joint changes. Mild-to-moderate multilevel degenerative disc changes. IMPRESSION: Compared to 08/11/2021: Postsurgical changes are again seen of prior Whipple procedure. No evidence of bowel obstruction. 1. There is new pneumatosis within small bowel within the right lower quadrant. No associated bowel wall thickening or inflammatory change. This has a broad etiologic differential, and can be seen with bowel ischemia and infarction, or other benign etiologies including infection, inflammatory bowel disease, connective tissue disorders, chronic pulmonary disease, autoimmune disease, and steroid use. Recommend clinical correlation for lactic acidosis which would be concerning for bowel ischemia. 2. Partially visualized bulky mediastinal lymphadenopathy is unchanged from prior. Previously this was assumed to be related to the patient's history of amyloidosis versus sarcoidosis. Critical Value/emergent results were called by telephone at the time of interpretation on 09/03/2021 at 11:51 am to provider [facility] , who verbally acknowledged these results. Electronically Signed   By: Yvonne [name] M.D.   On: 09/03/2021 11:52   DG Chest Port 1 View  Result Date: 09/03/2021 CLINICAL DATA:  Hiccups EXAM: PORTABLE CHEST 1 VIEW COMPARISON:  08/11/2021 FINDINGS: Miliary pattern of pulmonary nodules which are longstanding as discussed on chest CT from 2022. Mediastinal and hilar adenopathy with calcifications suggesting granulomatous  disease. There is no edema, consolidation, effusion, or pneumothorax. Recent meal with moderate distension of the visible stomach. Artifact from EKG leads. Previously identified feeding tube over the left upper quadrant, only partially covered. IMPRESSION: No acute finding or change from 08/11/2021. Electronically Signed   By: Jorje Guild M.D.   On: 09/03/2021 04:44      Assessment/Plan Postoperative nausea and vomiting Small bowel pneumatosis - Patient is a 76yo male who underwent Whipple on 08/07/21 by Dr. Hyman Hopes at [facility] for a benign pancreatic head mass. Returns to the ED after developing nausea and vomiting this morning. CT scan shows small amount of new small bowel pneumatosis in the RLQ. Patient is minimally tender in the epigastric region, he has no peritonitis on exam and does not appear acutely ill. I do not think that he has any ischemic bowel and would not recommend any emergent surgery at this time. On CT his stomach is also very distended and likely not emptying well. May need to drain G tube and hold PO for now. Recommend calling his surgeon at [facility] to review given his complicated postoperative course, and if they recommend admission we would advise transfer to [facility].  I reviewed ED provider notes, last 24 h vitals and pain scores, last 48 h intake and output, last 24 h labs and trends, and last 24 h imaging results   Wellington Hampshire, [facility] 09/03/2021, 1:19 PM Please see Amion for pager number during day hours 7:00am-4:30pm

## 2021-09-03 NOTE — ED Triage Notes (Addendum)
Pt had episode of vomiting around 2330 of coffee ground emesis. Pt feels a little fatigued. Has chronic hiccups. CBG 400s per EMS.

## 2021-09-03 NOTE — Discharge Instructions (Addendum)
Return for any problem.  Continue to use both Compazine and Thorazine as previously prescribed for control of hiccups.  Additionally, Benadryl may be utilized if necessary for hiccuping control.

## 2021-09-03 NOTE — Progress Notes (Signed)
PHARMACY ANTIBIOTIC CONSULT NOTE   William Barron a 76 y.o. male admitted on 09/03/2021 with concern for IAI.  Pharmacy has been consulted for Zosyn dosing. Of note, patient has an allergy to penicillin documented in the chart as "swelling, rash." After speaking with patient, he says this reaction occurred >35 years ago. Patient has tolerated cefazolin in the past without issue and after asking if he has tried a penicillin since his reaction, and stated he believed he had. HE has a dose of augmentin documented as "given" in his chart on 02/08/21, which he believes was a test dose to assess his reaction.   6/22: Scr 1.03, LA 1.9,  WBC 11.2  Vital Signs Stable   Estimated Creatinine Clearance: 43.2 mL/min (by C-G formula based on SCr of 1.03 mg/dL).  Plan: START Zosyn 3.375 g IV Q8H (30 min infusion) (followed by 4h EI after first dose)  Monitor renal function, clinical status, C/S, de-escalation  F/U surgery plans, RN notified to monitor for signs of allergic reaction- can completely de-label penicillin allergy with no reaction   Allergies:  Allergies  Allergen Reactions   Penicillins Swelling and Rash    Pt received ancef on 04-15-2021 without issue.  Discussed reaction with patient and his wife 09/03/21 and he stated his reaction to penicillin was 35 years ago. He believes he took a test dose of Augmentin in 2022 with no reaction.     There were no vitals filed for this visit.     Latest Ref Rng & Units 09/03/2021    4:19 AM 08/31/2021    6:04 AM 08/24/2021    6:03 AM  CBC  WBC 4.0 - 10.5 K/uL 11.2  7.6  6.0   Hemoglobin 13.0 - 17.0 g/dL 10.6  8.8  8.2   Hematocrit 39.0 - 52.0 % 33.5  28.5  25.9   Platelets 150 - 400 K/uL 466  461  539     Antimicrobials this admission: Zosyn 6/22>>  Microbiology results: 6/22 Bcx: sent  Thank you for allowing pharmacy to be a part of this patient's care.  Adria Dill, PharmD PGY-1 Acute Care Resident  09/03/2021 4:32 PM

## 2021-09-03 NOTE — ED Notes (Signed)
Pt ambulatory to bathroom, pt had large BM

## 2021-09-03 NOTE — ED Notes (Signed)
Called for purpleman initiation at this time

## 2021-09-03 NOTE — Consult Note (Signed)
Consultation  Referring Provider:   Dr. Lorin Mercy Primary Care Physician:  [name], MD Primary Gastroenterologist:  Dr. Carlean Purl       Reason for Consultation:   Emesis, dark.   Impression    76 year old male with history of multiple myeloma, amyloidosis, steroid use for this, history of Whipple 07/13/2021 with prolonged month-long hospitalization at [facility] afterwards due to feeding issues. Patient ultimately had PEG tube placed and sent to skilled nursing facility. Patient has had 2 episodes of emesis this morning while at skilled nursing facility sent to ER.  Emesis Status post Whipple, possible component of gastroparesis from complication, has GJ tube from CT not far enough down.  CT also showed a lot of debris.   Has been n.p.o. except for the last 5 days. No melena, questionable brown liquid/coffee grounds. CBC on 09/03/2021   WBC 11.2 HGB 10.6 MCV 87.5 Platelets 466 Anemia studies on 08/16/2021  Iron 16 Ferritin 181 B12 609  CT abdomen pelvis with new small bowel pneumatosis and right lower quadrant Lactic acid resolved, minimal white blood cell count. No peritoneal signs on exam  GERD/chronic hiccups  nausea since Whipple. Has been on PPI once daily  History of Whipple procedure for enlarging pancreatic cyst, found to be benign 07/13/2021 [name]  Multiple myeloma ([place]) Was on chronic steroids for 9 years, has not had chemotherapy for this in 9 months.  Diarrhea post Whipple Patient states has been having loose stools after eating with abdominal bloating.    Plan   G-tube has been vented. Likely be beneficial to discuss with [name] surgeon due to complicated postoperative course. Currently from CT scan patient has a lot of debris in the stomach, will do her liquids, bowel rest for at least 1 day then can potentially consider advancing tube placement. Once patient starts eating again would likely benefit from Creon therapy Patient on PPI once daily can consider  increasing to twice daily. When patient is able to eat again likely needs to reconsult for gastroparesis diet Further recommendations per Dr. Lorenso Courier.  Thank you for your kind consultation, we will continue to follow.         HPI:   William Barron is a 76 y.o. male with past medical history significant for prostate cancer status post radiation 2022, multiple myeloma, amyloidosis, CAD,  Whipple 07/13/21 by Dr.Nussbnaum at [facility] pancreatic head mass ( IP[facility]) with unfortunate prolonged postoperative course.  Patient previously known to Dr. Carlean Purl last office visit 2019 positive fit test. Recent admission to [facility] 05/31 through 06/07 for feeding difficulties.  08/07/2021 upper endoscopy Discharged to CIR 08/19/2021 and skilled nursing facility on 08/31/2021.  SNF with concern for coffee-ground emesis this morning, 2 episodes of vomiting so they brought him to the ER. Has been on tube feedings, taking at night for 10 hours.  Has lost about 20 pounds in the past 7 weeks. Patient just started to eat regular food by mouth in the past 5 days.  Wife feels like the patient may have "overdone it" Patient has been on steroids chronically for 9 years for multiple myeloma has recently not had any chemotherapy or medications in the last 8 months due to surgery. Since patient's surgery he has had more tan-colored loose stools right after eating or at night with tube feedings.  Having abdominal bloating.  Last bowel movement was about 3 to 4 hours ago loose.  Denies any melena or hematochezia. Patient is on Protonix once daily. Patient  has been getting MiraLAX as needed for constipation unknown if actually getting her just as needed. General surgery has come been consulted on the patient, hospitalist have reached out to Dr. Hyman Hopes who is the [facility] surgeon, recommended venting G port, bowel rest for 1 day and empiric coverage with Zosyn.  In the ED patient's had mild tachycardia up to 110s, otherwise  vitals are stable. CT shows new pneumonitis within the small bowel within right lower quadrant but no associated bowel wall thickening or inflammatory changes.  Extensive calcification are again seen within the pancreatic body and tail. Status postcholecystectomy unchanged periportal edema versus mild intrahepatic biliary ductal dilatation no focal liver lesions. , AST 31, ALT 46, alkaline phosphatase 614. Gastrojejunostomy tube distal tip apparently extending through gastrojejunal anastomosis just into the proximal small bowel. Blood cell count mildly elevated 11.2, lactic acid 2.9 with repeat at 1.9. Pending blood cultures. Plan minimally elevated at 39 Had normal lipase, hemoglobin 10.6, hematocrit 33.5, platelets 466.  12/04/2020 right upper quadrant abdominal ultrasound shows normal right upper quadrant ultrasound, no cholelithiasis, no gallstones 10/2017 colonoscopy Dr. Carlean Purl benign polyps, no recall secondary to age.  Did have amyloid deposits within his colon likely related to multiple myeloma.  Sent results to [name] at [facility].  Abnormal ED labs: Abnormal Labs Reviewed  CBC - Abnormal; Notable for the following components:      Result Value   WBC 11.2 (*)    RBC 3.83 (*)    Hemoglobin 10.6 (*)    HCT 33.5 (*)    RDW 17.1 (*)    Platelets 466 (*)    All other components within normal limits  COMPREHENSIVE METABOLIC PANEL - Abnormal; Notable for the following components:   Glucose, Bld 312 (*)    Total Protein 5.6 (*)    Albumin 2.6 (*)    ALT 46 (*)    Alkaline Phosphatase 614 (*)    All other components within normal limits  LACTIC ACID, PLASMA - Abnormal; Notable for the following components:   Lactic Acid, Venous 2.9 (*)    All other components within normal limits  CBG MONITORING, ED - Abnormal; Notable for the following components:   Glucose-Capillary 312 (*)    All other components within normal limits  TROPONIN I (HIGH SENSITIVITY) - Abnormal; Notable for  the following components:   Troponin I (High Sensitivity) 55 (*)    All other components within normal limits  TROPONIN I (HIGH SENSITIVITY) - Abnormal; Notable for the following components:   Troponin I (High Sensitivity) 39 (*)    All other components within normal limits     Past Medical History:  Diagnosis Date   Amyloidosis ([place])    Diabetes mellitus without complication (HCC)    GERD (gastroesophageal reflux disease)    Hearing loss    Has hearing aids   History of kidney stones    Hyperlipidemia    Multiple myeloma (HCC)    and amylodosis    Nephrolithiasis    Occasional tremors    Pneumonia    Prostate cancer ([place])    Prostatitis    acute and chronic   Skin cancer     Surgical History:  He  has a past surgical history that includes Rotator cuff repair; Appendectomy; Elbow surgery (03/15/2001); Colonoscopy (04/07/2000); Hernia repair; Cardiac catheterization (N/A, 06/13/2015); Extracorporeal shock wave lithotripsy (Left, 07/17/2018); Extracorporeal shock wave lithotripsy (Right, 12/08/2020); Foot arthrodesis (Right, 04/15/2021); Whipple procedure (07/13/2021); and IR Mech Remov Obstruc Mat Any Colon Tube  W/Fluoro (08/27/2021). Family History:  His family history includes CAD (age of onset: 23) in his brother; Cancer in his paternal grandmother; Pancreatic cancer in his father; Parkinsonism in his mother; Prostate cancer in his father. Social History:   reports that he quit smoking about 44 years ago. His smoking use included cigarettes. He has a 60.00 pack-year smoking history. He has never used smokeless tobacco. He reports current alcohol use of about 3.0 standard drinks of alcohol per week. He reports that he does not use drugs.  Prior to Admission medications   Medication Sig Start Date End Date Taking? Authorizing Provider  diphenhydrAMINE (BENADRYL) 25 MG tablet Take 0.5 tablets (12.5 mg total) by mouth every 6 (six) hours as needed (Hiccups). 09/03/21  Yes Valarie Merino, MD  acetaminophen (TYLENOL) 325 MG tablet Place 1-2 tablets (325-650 mg total) into feeding tube every 4 (four) hours as needed for mild pain. 08/31/21   Love, Ivan Anchors, PA-C  alum & mag hydroxide-simeth (MAALOX/MYLANTA) 200-200-20 MG/5ML suspension Place 30 mLs into feeding tube every 4 (four) hours as needed for indigestion. 08/31/21   Love, Ivan Anchors, PA-C  chlorproMAZINE (THORAZINE) 10 MG tablet Take 0.5 tablets (5 mg total) by mouth 3 (three) times daily as needed for hiccoughs. 08/31/21   Love, Ivan Anchors, PA-C  ferrous sulfate 300 (60 Fe) MG/5ML syrup Place 5 mLs (300 mg total) into feeding tube every other day. 09/01/21   Love, Ivan Anchors, PA-C  gabapentin (NEURONTIN) 250 MG/5ML solution Place 2 mLs (100 mg total) into feeding tube every 8 (eight) hours. 08/31/21   Love, Ivan Anchors, PA-C  insulin aspart (NOVOLOG) 100 UNIT/ML injection Inject 0-9 Units into the skin every 4 (four) hours. 08/31/21   Love, Ivan Anchors, PA-C  megestrol (MEGACE) 400 MG/10ML suspension Place 10 mLs (400 mg total) into feeding tube 2 (two) times daily. 08/31/21   Love, Ivan Anchors, PA-C  melatonin 3 MG TABS tablet Place 2 tablets (6 mg total) into feeding tube at bedtime as needed. 08/31/21   Love, Ivan Anchors, PA-C  Mouthwashes (MOUTH RINSE) LIQD solution 15 mLs by Mouth Rinse route 2 times daily at 12 noon and 4 pm. 08/31/21   Love, Ivan Anchors, PA-C  Nutritional Supplements (FEEDING SUPPLEMENT, GLUCERNA 1.5 CAL,) LIQD At 80 cc/hr from 6 pm to 4 am. 08/31/21   Love, Ivan Anchors, PA-C  Nutritional Supplements (FEEDING SUPPLEMENT, PROSOURCE TF,) liquid Place 45 mLs into feeding tube 2 (two) times daily. 08/31/21   Love, Ivan Anchors, PA-C  pantoprazole sodium (PROTONIX) 40 mg Place 40 mg into feeding tube daily. 08/31/21   Love, Ivan Anchors, PA-C  phenol (CHLORASEPTIC) 1.4 % LIQD Use as directed 1 spray in the mouth or throat as needed for throat irritation / pain. 08/31/21   Love, Ivan Anchors, PA-C  polyethylene glycol (MIRALAX / GLYCOLAX) 17 g packet  Place 17 g into feeding tube daily as needed for mild constipation. 08/31/21   Love, Ivan Anchors, PA-C  prochlorperazine (COMPAZINE) 5 MG tablet Place 1-2 tablets (5-10 mg total) into feeding tube every 6 (six) hours as needed for nausea. 08/31/21   Love, Ivan Anchors, PA-C  testosterone (ANDROGEL) 50 MG/5GM (1%) GEL Place 10 g onto the skin daily. 09/01/21   Love, Ivan Anchors, PA-C  Water For Irrigation, Sterile (FREE WATER) SOLN Place 150 mLs into feeding tube every 4 (four) hours. 08/31/21   Love, Ivan Anchors, PA-C    No current facility-administered medications for this encounter.   Current Outpatient  Medications  Medication Sig Dispense Refill   diphenhydrAMINE (BENADRYL) 25 MG tablet Take 0.5 tablets (12.5 mg total) by mouth every 6 (six) hours as needed (Hiccups). 30 tablet 0   acetaminophen (TYLENOL) 325 MG tablet Place 1-2 tablets (325-650 mg total) into feeding tube every 4 (four) hours as needed for mild pain.     alum & mag hydroxide-simeth (MAALOX/MYLANTA) 200-200-20 MG/5ML suspension Place 30 mLs into feeding tube every 4 (four) hours as needed for indigestion. 355 mL 0   chlorproMAZINE (THORAZINE) 10 MG tablet Take 0.5 tablets (5 mg total) by mouth 3 (three) times daily as needed for hiccoughs.     ferrous sulfate 300 (60 Fe) MG/5ML syrup Place 5 mLs (300 mg total) into feeding tube every other day. 150 mL 3   gabapentin (NEURONTIN) 250 MG/5ML solution Place 2 mLs (100 mg total) into feeding tube every 8 (eight) hours.  12   insulin aspart (NOVOLOG) 100 UNIT/ML injection Inject 0-9 Units into the skin every 4 (four) hours. 10 mL 11   megestrol (MEGACE) 400 MG/10ML suspension Place 10 mLs (400 mg total) into feeding tube 2 (two) times daily. 120 mL 0   melatonin 3 MG TABS tablet Place 2 tablets (6 mg total) into feeding tube at bedtime as needed.  0   Mouthwashes (MOUTH RINSE) LIQD solution 15 mLs by Mouth Rinse route 2 times daily at 12 noon and 4 pm.  0   Nutritional Supplements (FEEDING  SUPPLEMENT, GLUCERNA 1.5 CAL,) LIQD At 80 cc/hr from 6 pm to 4 am.     Nutritional Supplements (FEEDING SUPPLEMENT, PROSOURCE TF,) liquid Place 45 mLs into feeding tube 2 (two) times daily.     pantoprazole sodium (PROTONIX) 40 mg Place 40 mg into feeding tube daily.     phenol (CHLORASEPTIC) 1.4 % LIQD Use as directed 1 spray in the mouth or throat as needed for throat irritation / pain.  0   polyethylene glycol (MIRALAX / GLYCOLAX) 17 g packet Place 17 g into feeding tube daily as needed for mild constipation. 14 each 0   prochlorperazine (COMPAZINE) 5 MG tablet Place 1-2 tablets (5-10 mg total) into feeding tube every 6 (six) hours as needed for nausea. 30 tablet 0   testosterone (ANDROGEL) 50 MG/5GM (1%) GEL Place 10 g onto the skin daily.     Water For Irrigation, Sterile (FREE WATER) SOLN Place 150 mLs into feeding tube every 4 (four) hours.      Allergies as of 09/03/2021 - Review Complete 09/03/2021  Allergen Reaction Noted   Penicillins Swelling and Rash 09/10/2010    Review of Systems:    Constitutional: No weight loss, fever, chills, weakness or fatigue HEENT: Eyes: No change in vision               Ears, Nose, Throat:  No change in hearing or congestion Skin: No rash or itching Cardiovascular: No chest pain, chest pressure or palpitations   Respiratory: No SOB or cough Gastrointestinal: See HPI and otherwise negative Genitourinary: No dysuria or change in urinary frequency Neurological: No headache, dizziness or syncope Musculoskeletal: No new muscle or joint pain Hematologic: No bleeding or bruising Psychiatric: No history of depression or anxiety     Physical Exam:  Vital signs in last 24 hours: Temp:  [98 F (36.7 C)] 98 F (36.7 C) (06/22 0915) Pulse Rate:  [96-110] 99 (06/22 1515) Resp:  [13-34] 17 (06/22 1345) BP: (103-146)/(58-101) 103/73 (06/22 1515) SpO2:  [96 %-100 %] 99 % (  06/22 1515)   Last BM recorded by nurses in past 5 days Stool Type: Type 4 (Like a  smooth, soft sausage or snake) (08/31/2021 11:00 AM)  General:   Thin appearing male in no acute distress Head:  Normocephalic and atraumatic. Eyes: sclerae anicteric,conjunctive pink  Heart:  regular rate and rhythm Pulm: Clear anteriorly; no wheezing Abdomen:  Soft, well-healed vertical epigastric scar, Non-distended AB, Active bowel sounds. mild tenderness in the epigastrium. Without guarding and Without rebound, No organomegaly appreciated.  GJ in left abdomen. Extremities:  Without edema. Msk:  Symmetrical without gross deformities. Peripheral pulses intact.  Neurologic:  Alert and  oriented x4;  No focal deficits.  Skin:   Dry and intact without significant lesions or rashes. Psychiatric:  Cooperative. Normal mood and affect.  LAB RESULTS: Recent Labs    09/03/21 0419  WBC 11.2*  HGB 10.6*  HCT 33.5*  PLT 466*   BMET Recent Labs    09/03/21 0419  NA 139  K 4.6  CL 103  CO2 23  GLUCOSE 312*  BUN 18  CREATININE 1.03  CALCIUM 8.9   LFT Recent Labs    09/03/21 0419  PROT 5.6*  ALBUMIN 2.6*  AST 31  ALT 46*  ALKPHOS 614*  BILITOT 0.4   PT/INR Recent Labs    09/03/21 0419  LABPROT 14.8  INR 1.2    STUDIES: CT ABDOMEN PELVIS W CONTRAST  Result Date: 09/03/2021 CLINICAL DATA:  Nausea and vomiting. EXAM: CT ABDOMEN AND PELVIS WITH CONTRAST TECHNIQUE: Multidetector CT imaging of the abdomen and pelvis was performed using the standard protocol following bolus administration of intravenous contrast. RADIATION DOSE REDUCTION: This exam was performed according to the departmental dose-optimization program which includes automated exposure control, adjustment of the mA and/or kV according to patient size and/or use of iterative reconstruction technique. CONTRAST:  128m OMNIPAQUE IOHEXOL 300 MG/ML  SOLN COMPARISON:  KUB 08/25/2021, CT abdomen and pelvis with contrast 08/11/2021 FINDINGS: Lower chest: There is bilateral posterior subpleural chronic interlobular septal  thickening/scarring that is similar to prior. No pleural effusion. There is again bulky subcarinal and right paraesophageal lymphadenopathy with scattered calcifications. This measures up to approximately 3.7 cm in AP dimension within the visualized portion, not significantly changed from 08/11/2021. There are again coarsened calcifications within the inferior aspect of the lingula. Heart size is mildly enlarged. Coronary artery calcifications are noted. Hepatobiliary: Smooth liver contours. Status post cholecystectomy unchanged minimal periportal edema versus mild intrahepatic biliary ductal dilatation. No focal liver lesion is seen. Pancreas: Postsurgical changes are again seen of resection of the pancreatic head. Extensive calcifications are again seen within the pancreatic body and tail. Spleen: Normal in size without focal abnormality. Adrenals/Urinary Tract: Normal bilateral adrenal glands. The kidneys enhance uniformly and are symmetric in size without hydronephrosis. There are tiny nonobstructing stones within the mid and lower pole of the left kidney measuring up to 4 mm within the left lower pole, unchanged. Subcentimeter low-attenuation lesions within the upper pole of the left kidney are too small to further characterize and remain unchanged from prior. No follow-up imaging is recommended. No hydronephrosis. No focal urinary bladder wall thickening. Stomach/Bowel: No focal bowel wall thickening. There is concern for pneumatosis within small bowel within the right lower quadrant (axial series 4, images 35 through 58, coronal series 7 images 28 through 69). The terminal ileum is unremarkable. The appendix is not confidently identified however no inflammatory changes are seen around the cecum to indicate secondary signs of acute appendicitis.  Note is made of prior Whipple procedure. Gastrojejunostomy tube is again seen with distal tip apparently extending through a gastrojejunal anastomosis just into the  proximal small bowel. Extends less into the small bowel compared to the prior CT. Vascular/Lymphatic: Moderate to high-grade atherosclerotic calcifications. No abdominal aortic aneurysm. No pelvic or retroperitoneal or mesenteric lymphadenopathy is seen. Reproductive: The prostate measures up to 6.1 cm in transverse dimension, moderately to markedly enlarged and unchanged from prior. The seminal vesicles are grossly unremarkable. Other: Midline abdominal postsurgical changes. Mild left inguinal fat and fluid containing hernia is unchanged. No free air or free fluid is seen within the abdomen or pelvis. Musculoskeletal: Grade 1 anterolisthesis of L4 on L5 is unchanged from prior likely secondary to degenerative facet joint changes. Mild-to-moderate multilevel degenerative disc changes. IMPRESSION: Compared to 08/11/2021: Postsurgical changes are again seen of prior Whipple procedure. No evidence of bowel obstruction. 1. There is new pneumatosis within small bowel within the right lower quadrant. No associated bowel wall thickening or inflammatory change. This has a broad etiologic differential, and can be seen with bowel ischemia and infarction, or other benign etiologies including infection, inflammatory bowel disease, connective tissue disorders, chronic pulmonary disease, autoimmune disease, and steroid use. Recommend clinical correlation for lactic acidosis which would be concerning for bowel ischemia. 2. Partially visualized bulky mediastinal lymphadenopathy is unchanged from prior. Previously this was assumed to be related to the patient's history of amyloidosis versus sarcoidosis. Critical Value/emergent results were called by telephone at the time of interpretation on 09/03/2021 at 11:51 am to provider [facility] , who verbally acknowledged these results. Electronically Signed   By: Yvonne [name] M.D.   On: 09/03/2021 11:52   DG Chest Port 1 View  Result Date: 09/03/2021 CLINICAL DATA:  Hiccups EXAM:  PORTABLE CHEST 1 VIEW COMPARISON:  08/11/2021 FINDINGS: Miliary pattern of pulmonary nodules which are longstanding as discussed on chest CT from 2022. Mediastinal and hilar adenopathy with calcifications suggesting granulomatous disease. There is no edema, consolidation, effusion, or pneumothorax. Recent meal with moderate distension of the visible stomach. Artifact from EKG leads. Previously identified feeding tube over the left upper quadrant, only partially covered. IMPRESSION: No acute finding or change from 08/11/2021. Electronically Signed   By: Jorje Guild M.D.   On: 09/03/2021 04:44     Vladimir Crofts  09/03/2021, 4:06 PM

## 2021-09-03 NOTE — ED Notes (Signed)
Phlebotomy at bedside.

## 2021-09-03 NOTE — ED Notes (Signed)
Patient transported to CT 

## 2021-09-03 NOTE — ED Provider Notes (Deleted)
Patient seen after prior EDP  Patient is comfortable.  Hiccups have improved.  Patient without evidence of other significant acute pathology on work-up or evaluation.  Patient is appropriate for discharge.  Importance of continued close outpatient follow-up is stressed.  Strict return precautions given and understood.   Valarie Merino, MD 09/03/21 [id number]

## 2021-09-03 NOTE — ED Notes (Signed)
Dr. Francia Greaves at bedside

## 2021-09-03 NOTE — ED Provider Notes (Signed)
Patient seen after prior EDP.  Patient with continued complaint of mild upper abdominal pain.  Patient and patient family requested CT imaging to further evaluate his discomfort.  CT abdomen pelvis performed today is concerning for possible area of pneumatosis in the small bowel.  Lactic acid is 2.9.  Patient without significant tenderness in the right lower quadrant (overlying area of possible pneumatosis).  Patient's exam is not suggestive of acute surgical abdomen.  [facility] surgery [facility] at [facility] has evaluated the patient.  Patient without indication for emergent surgery.  However, patient would likely benefit from overnight observation.  Patient is known to [place].  We will contact [name] (Dr. Hyman Hopes) for possible transfer     Valarie Merino, MD 09/03/21 1359

## 2021-09-03 NOTE — ED Notes (Signed)
Dr Trey Paula phone number was given to Dr. Lorin Mercy via secure chat at this time as requested by the family member

## 2021-09-03 NOTE — ED Notes (Signed)
Wife at bedside and has questions for Dr. Dr. Francia Greaves notified.

## 2021-09-03 NOTE — ED Notes (Signed)
BGL is 312

## 2021-09-04 DIAGNOSIS — R633 Feeding difficulties, unspecified: Secondary | ICD-10-CM | POA: Diagnosis not present

## 2021-09-04 DIAGNOSIS — E43 Unspecified severe protein-calorie malnutrition: Secondary | ICD-10-CM | POA: Diagnosis present

## 2021-09-04 DIAGNOSIS — E785 Hyperlipidemia, unspecified: Secondary | ICD-10-CM | POA: Diagnosis present

## 2021-09-04 DIAGNOSIS — K219 Gastro-esophageal reflux disease without esophagitis: Secondary | ICD-10-CM | POA: Diagnosis present

## 2021-09-04 DIAGNOSIS — L89301 Pressure ulcer of unspecified buttock, stage 1: Secondary | ICD-10-CM | POA: Diagnosis present

## 2021-09-04 DIAGNOSIS — K3184 Gastroparesis: Secondary | ICD-10-CM | POA: Diagnosis present

## 2021-09-04 DIAGNOSIS — K30 Functional dyspepsia: Secondary | ICD-10-CM | POA: Diagnosis present

## 2021-09-04 DIAGNOSIS — R1110 Vomiting, unspecified: Secondary | ICD-10-CM | POA: Diagnosis present

## 2021-09-04 DIAGNOSIS — Z90410 Acquired total absence of pancreas: Secondary | ICD-10-CM | POA: Diagnosis not present

## 2021-09-04 DIAGNOSIS — E859 Amyloidosis, unspecified: Secondary | ICD-10-CM | POA: Diagnosis present

## 2021-09-04 DIAGNOSIS — G250 Essential tremor: Secondary | ICD-10-CM

## 2021-09-04 DIAGNOSIS — R066 Hiccough: Secondary | ICD-10-CM | POA: Diagnosis present

## 2021-09-04 DIAGNOSIS — Z9049 Acquired absence of other specified parts of digestive tract: Secondary | ICD-10-CM | POA: Diagnosis not present

## 2021-09-04 DIAGNOSIS — K9423 Gastrostomy malfunction: Secondary | ICD-10-CM | POA: Diagnosis not present

## 2021-09-04 DIAGNOSIS — E1143 Type 2 diabetes mellitus with diabetic autonomic (poly)neuropathy: Secondary | ICD-10-CM | POA: Diagnosis present

## 2021-09-04 DIAGNOSIS — R935 Abnormal findings on diagnostic imaging of other abdominal regions, including retroperitoneum: Secondary | ICD-10-CM | POA: Diagnosis not present

## 2021-09-04 DIAGNOSIS — F32A Depression, unspecified: Secondary | ICD-10-CM | POA: Diagnosis present

## 2021-09-04 DIAGNOSIS — I509 Heart failure, unspecified: Secondary | ICD-10-CM | POA: Diagnosis not present

## 2021-09-04 DIAGNOSIS — H9190 Unspecified hearing loss, unspecified ear: Secondary | ICD-10-CM | POA: Diagnosis present

## 2021-09-04 DIAGNOSIS — R197 Diarrhea, unspecified: Secondary | ICD-10-CM | POA: Diagnosis not present

## 2021-09-04 DIAGNOSIS — R590 Localized enlarged lymph nodes: Secondary | ICD-10-CM | POA: Diagnosis present

## 2021-09-04 DIAGNOSIS — Y848 Other medical procedures as the cause of abnormal reaction of the patient, or of later complication, without mention of misadventure at the time of the procedure: Secondary | ICD-10-CM | POA: Diagnosis present

## 2021-09-04 DIAGNOSIS — Z681 Body mass index (BMI) 19 or less, adult: Secondary | ICD-10-CM | POA: Diagnosis not present

## 2021-09-04 DIAGNOSIS — F418 Other specified anxiety disorders: Secondary | ICD-10-CM | POA: Diagnosis not present

## 2021-09-04 DIAGNOSIS — I2510 Atherosclerotic heart disease of native coronary artery without angina pectoris: Secondary | ICD-10-CM | POA: Diagnosis not present

## 2021-09-04 DIAGNOSIS — C61 Malignant neoplasm of prostate: Secondary | ICD-10-CM | POA: Diagnosis present

## 2021-09-04 DIAGNOSIS — D638 Anemia in other chronic diseases classified elsewhere: Secondary | ICD-10-CM | POA: Diagnosis present

## 2021-09-04 DIAGNOSIS — H4920 Sixth [abducent] nerve palsy, unspecified eye: Secondary | ICD-10-CM | POA: Diagnosis present

## 2021-09-04 DIAGNOSIS — E8589 Other amyloidosis: Secondary | ICD-10-CM | POA: Diagnosis not present

## 2021-09-04 DIAGNOSIS — T85528A Displacement of other gastrointestinal prosthetic devices, implants and grafts, initial encounter: Secondary | ICD-10-CM | POA: Diagnosis present

## 2021-09-04 DIAGNOSIS — K6389 Other specified diseases of intestine: Secondary | ICD-10-CM | POA: Diagnosis present

## 2021-09-04 DIAGNOSIS — F419 Anxiety disorder, unspecified: Secondary | ICD-10-CM | POA: Diagnosis present

## 2021-09-04 DIAGNOSIS — E876 Hypokalemia: Secondary | ICD-10-CM | POA: Diagnosis present

## 2021-09-04 DIAGNOSIS — C9000 Multiple myeloma not having achieved remission: Secondary | ICD-10-CM | POA: Diagnosis present

## 2021-09-04 DIAGNOSIS — I110 Hypertensive heart disease with heart failure: Secondary | ICD-10-CM | POA: Diagnosis not present

## 2021-09-04 DIAGNOSIS — Z66 Do not resuscitate: Secondary | ICD-10-CM | POA: Diagnosis present

## 2021-09-04 DIAGNOSIS — R112 Nausea with vomiting, unspecified: Secondary | ICD-10-CM | POA: Diagnosis not present

## 2021-09-04 DIAGNOSIS — Z7189 Other specified counseling: Secondary | ICD-10-CM | POA: Diagnosis not present

## 2021-09-04 LAB — CBC
HCT: 28.3 % — ABNORMAL LOW (ref 39.0–52.0)
Hemoglobin: 8.7 g/dL — ABNORMAL LOW (ref 13.0–17.0)
MCH: 27 pg (ref 26.0–34.0)
MCHC: 30.7 g/dL (ref 30.0–36.0)
MCV: 87.9 fL (ref 80.0–100.0)
Platelets: 373 10*3/uL (ref 150–400)
RBC: 3.22 MIL/uL — ABNORMAL LOW (ref 4.22–5.81)
RDW: 17.2 % — ABNORMAL HIGH (ref 11.5–15.5)
WBC: 8.1 10*3/uL (ref 4.0–10.5)
nRBC: 0 % (ref 0.0–0.2)

## 2021-09-04 LAB — COMPREHENSIVE METABOLIC PANEL
ALT: 41 U/L (ref 0–44)
AST: 35 U/L (ref 15–41)
Albumin: 2.2 g/dL — ABNORMAL LOW (ref 3.5–5.0)
Alkaline Phosphatase: 504 U/L — ABNORMAL HIGH (ref 38–126)
Anion gap: 5 (ref 5–15)
BUN: 17 mg/dL (ref 8–23)
CO2: 22 mmol/L (ref 22–32)
Calcium: 8.1 mg/dL — ABNORMAL LOW (ref 8.9–10.3)
Chloride: 112 mmol/L — ABNORMAL HIGH (ref 98–111)
Creatinine, Ser: 0.69 mg/dL (ref 0.61–1.24)
GFR, Estimated: >60 mL/min (ref >60)
Glucose, Bld: 145 mg/dL — ABNORMAL HIGH (ref 70–99)
Potassium: 3.8 mmol/L (ref 3.5–5.1)
Sodium: 139 mmol/L (ref 135–145)
Total Bilirubin: 0.6 mg/dL (ref 0.3–1.2)
Total Protein: 4.8 g/dL — ABNORMAL LOW (ref 6.5–8.1)

## 2021-09-04 LAB — BLOOD CULTURE ID PANEL (REFLEXED) - BCID2

## 2021-09-04 LAB — GLUCOSE, CAPILLARY
Glucose-Capillary: 150 mg/dL — ABNORMAL HIGH (ref 70–99)
Glucose-Capillary: 115 mg/dL — ABNORMAL HIGH (ref 70–99)
Glucose-Capillary: 110 mg/dL — ABNORMAL HIGH (ref 70–99)
Glucose-Capillary: 108 mg/dL — ABNORMAL HIGH (ref 70–99)

## 2021-09-04 MED ORDER — PIPERACILLIN-TAZOBACTAM 3.375 G IVPB
3.3750 g | Freq: Three times a day (TID) | INTRAVENOUS | Status: DC
  Administered 2021-09-04 – 2021-09-06 (×5): 3.375 g via INTRAVENOUS
  Filled 2021-09-04 (×5): qty 50

## 2021-09-04 NOTE — Progress Notes (Signed)
Initial Nutrition Assessment  DOCUMENTATION CODES:   Severe malnutrition in context of acute illness/injury  INTERVENTION:   When able to resume nocturnal tube feedings via J-tube, recommend: Glucerna 1.5 at 80 ml/h x 10 hours (800 ml per day) Prosource TF 45 ml BID  Provides 1280 kcal, 88 gm protein, 607 ml free water daily.  Obtain current weight.  NUTRITION DIAGNOSIS:   Severe Malnutrition related to acute illness (S/P Whipple procedure May 2023) as evidenced by severe muscle depletion, severe fat depletion, percent weight loss (9% weight loss within one month).  GOAL:   Patient will meet greater than or equal to 90% of their needs  MONITOR:   Diet advancement, PO intake, Labs, TF tolerance  REASON FOR ASSESSMENT:   Consult Assessment of nutrition requirement/status  ASSESSMENT:   76 yo male admitted with worsening hiccups and emesis. PMH includes intraductal papillary mucinous neoplasm S/P Whipple surgery 07/13/21, G-J tube, multiple myeloma, amyloidosis, DM, depression, anxiety, prostate cancer, dyslipidemia.  Patient was at [facility] for CIR earlier this month. He and his wife were educated on diet recommendations for Whipple procedure during his rehab stay.   Patient states that he was eating okay PTA, up until yesterday, when he developed a bad case of the hiccups which caused him to vomit. He reports 20 lb weight loss since Whipple surgery in May. He is unsure of his tube feeding regimen PTA, but states he received his feedings over night. Per Home Med list, patient was receiving Glucerna 1.5 at 80 ml/h x 10 hours (6pm-4am) with Prosource TF 45 ml BID. This provides 1280 kcal, 88 gm protein, 607 ml free water daily.  CT abd/pelvis on 6/22 showed small bowel pneumatosis; J port of GJ tube found to be just into the proximal small bowel (extends less into the small bowel compared to the prior CT). GI has been consulted for repositioning of J-port further into small bowel.   Currently NPO except ice chips. G-tube to gravity drainage today. Current free water flush orders: 150 ml q 4 hours.  Labs reviewed.  CBG: [phone]  Medications reviewed and include Novolog, Protonix.  Patient meets criteria for severe malnutrition, given severe depletion of muscle and subcutaneous fat mass, with 9% weight loss x within 1 month.  Current weight not available.   NUTRITION - FOCUSED PHYSICAL EXAM:  Flowsheet Row Most Recent Value  Orbital Region Severe depletion  Upper Arm Region Severe depletion  Thoracic and Lumbar Region Severe depletion  Buccal Region Moderate depletion  Temple Region Severe depletion  Clavicle Bone Region Severe depletion  Clavicle and Acromion Bone Region Severe depletion  Scapular Bone Region Severe depletion  Dorsal Hand Moderate depletion  Patellar Region Severe depletion  Anterior Thigh Region Severe depletion  Posterior Calf Region Severe depletion  Edema (RD Assessment) None  Hair Reviewed  Eyes Reviewed  Mouth Reviewed  Skin Reviewed  Nails Reviewed       Diet Order:   Diet Order             Diet NPO time specified Except for: Sips with Meds  Diet effective now                   EDUCATION NEEDS:   No education needs have been identified at this time  Skin:  Skin Assessment: Reviewed RN Assessment  Last BM:  no BM documented  Height:   Ht Readings from Last 1 Encounters:  08/19/21 5\' 5"  (1.651 m)    Weight:  Wt Readings from Last 1 Encounters:  08/31/21 51.7 kg    Estimated Nutritional Needs:   Kcal:  2150-2350  Protein:  100-125 gm  Fluid:  >/= 2.2 L    Gabriel Rainwater RD, LDN, CNSC Please refer to Amion for contact information.

## 2021-09-04 NOTE — Consult Note (Signed)
Consultation Note Date: 09/04/2021   Patient Name: William Barron  DOB: June 18, 1945  MRN: [id number]  Age / Sex: 76 y.o., male  PCP: [name], MD Referring Physician: Hosie Poisson, MD  Reason for Consultation: Establishing goals of care  HPI/Patient Profile: 76 y.o. male  with past medical history of DM; HLD; multiple myeloma; amyloidosis; prostate CA; and recent (07/13/21) Whipple procedure for pancreatic neoplasm admitted on 09/03/2021 with emesis.    Patient needs advancement of J-tube, also with possible gastroparesis.  PMT has been consulted to assist with goals of care conversation.  Clinical Assessment and Goals of Care:  I have reviewed medical records including EPIC notes, labs and imaging, assessed the patient and then met at the bedside with patient's wife [name] to discuss diagnosis prognosis, [place], EOL wishes, disposition and options.  I introduced Palliative Medicine as specialized medical care for people living with serious illness. It focuses on providing relief from the symptoms and stress of a serious illness. The goal is to improve quality of life for both the patient and the family.  We discussed a brief life review of the patient and then focused on their current illness.   I attempted to elicit values and goals of care important to the patient.    Medical History Review and Understanding:  We discussed patient's concern for 5 months without treatment of multiple myeloma, recent Whipple with plan for repositioning of GJ tube.  Code Status: Concepts specific to code status, artifical feeding and hydration, and rehospitalization were considered and discussed.  Patient shares that he would like to attempt CPR once "because that just make sense to me" and I recommended continuation of DNR status, understanding evidenced-based poor outcomes in similar hospitalized patients, as the cause  of the arrest is likely associated with chronic/terminal disease rather than a reversible acute cardio-pulmonary event.  He has not thought about whether or not he would desire a ventilator.  He agrees to continue this conversation before making further adjustments to his CODE STATUS.  He has never thought about long-term feeding tube if he needed it.  Discussion: Patient's primary goal is to return home after SNF.  He is to return to [place].  We discussed palliative care role in advocacy and assistance with coordination between multiple caregivers and community agencies, as well as symptom management and ongoing goals of care conversations based on his progress and quality of life.  They are currently planning for addition of home health support when he does return home, with preference for [facility] [phone]) after hearing good recommendations from friends.  They are also attempting to plan for patient's oncology visits to resume and would like patient's attending to discuss his care with Dr. Pat Kocher [phone]).  He is worried about the time he has gone without treatment for his multiple myeloma.  He is also considering establishing care with Dr. Marin Olp.  Hospice and Palliative Care services outpatient were explained and offered.   Discussed the importance of continued conversation with family and the medical providers regarding overall plan of care and treatment options, ensuring decisions are within the context of the patient's values and GOCs.   Questions and concerns were addressed.  Hard Choices booklet left for review. The family was encouraged to call with questions or concerns.  PMT will continue to support holistically.    SUMMARY OF RECOMMENDATIONS   -DNR currently in place at admission, ongoing conversations -Discussed patient's request for conversation with Dr. Karleen [place] via secure chat and provided  oncologist contact information -Goal is for SNF with palliative care  to follow at home after discharge -PMT will continue to follow and support  Prognosis:  Unable to determine  Discharge Planning: [place] for rehab with Palliative care service follow-up      Primary Diagnoses: Present on Admission:  Multiple myeloma ([place])  Dyslipidemia  Depression with anxiety  Benign essential tremor  Chronic hiccups  Emesis  Amyloidosis ([place])  Prostate cancer ([place])  Vomiting   Physical Exam Vitals and nursing note reviewed.  Constitutional:      General: He is not in acute distress. Cardiovascular:     Rate and Rhythm: Normal rate.  Pulmonary:     Effort: Pulmonary effort is normal.  Neurological:     Mental Status: He is alert.  Psychiatric:        Mood and Affect: Mood normal.    Vital Signs: BP 113/61 (BP Location: Left Arm)   Pulse 84   Temp 97.6 F (36.4 C) (Oral)   Resp 17   SpO2 98%  Pain Scale: 0-10   Pain Score: 0-No pain   SpO2: SpO2: 98 % O2 Device:SpO2: 98 % O2 Flow Rate: .    Palliative Assessment/Data:     MDM: High   Johnell Comings Palliative Medicine Team Team phone # [phone]  Thank you for allowing the Palliative Medicine Team to assist in the care of this patient. Please utilize secure chat with additional questions, if there is no response within 30 minutes please call the above phone number.  Palliative Medicine Team providers are available by phone from 7am to 7pm daily and can be reached through the team cell phone.  Should this patient require assistance outside of these hours, please call the patient's attending physician.

## 2021-09-04 NOTE — Progress Notes (Addendum)
PA message the RN regarding the JG tube. Ordered to connect the G-tube to drainage bag and flush 20ml q4hrs, and 30ml for J-tube. No complain of any GI issue so far. Currently NPO but PA ordered ice chips is ok.

## 2021-09-05 DIAGNOSIS — F418 Other specified anxiety disorders: Secondary | ICD-10-CM | POA: Diagnosis not present

## 2021-09-05 DIAGNOSIS — E8589 Other amyloidosis: Secondary | ICD-10-CM | POA: Diagnosis not present

## 2021-09-05 DIAGNOSIS — G250 Essential tremor: Secondary | ICD-10-CM | POA: Diagnosis not present

## 2021-09-05 DIAGNOSIS — R112 Nausea with vomiting, unspecified: Secondary | ICD-10-CM | POA: Diagnosis not present

## 2021-09-05 DIAGNOSIS — Z7189 Other specified counseling: Secondary | ICD-10-CM | POA: Diagnosis not present

## 2021-09-05 DIAGNOSIS — L8990 Pressure ulcer of unspecified site, unspecified stage: Secondary | ICD-10-CM | POA: Insufficient documentation

## 2021-09-05 DIAGNOSIS — Z90410 Acquired total absence of pancreas: Secondary | ICD-10-CM | POA: Diagnosis not present

## 2021-09-05 LAB — GLUCOSE, CAPILLARY
Glucose-Capillary: 128 mg/dL — ABNORMAL HIGH (ref 70–99)
Glucose-Capillary: 103 mg/dL — ABNORMAL HIGH (ref 70–99)
Glucose-Capillary: 102 mg/dL — ABNORMAL HIGH (ref 70–99)
Glucose-Capillary: 119 mg/dL — ABNORMAL HIGH (ref 70–99)

## 2021-09-05 MED ORDER — TESTOSTERONE 50 MG/5GM (1%) TD GEL
5.0000 g | Freq: Every day | TRANSDERMAL | Status: DC
Start: 2021-09-05 — End: 2021-09-10
  Administered 2021-09-05 – 2021-09-09 (×4): 5 g via TRANSDERMAL
  Filled 2021-09-05 (×4): qty 5

## 2021-09-05 MED ORDER — MEGESTROL ACETATE 400 MG/10ML PO SUSP
400.0000 mg | Freq: Two times a day (BID) | ORAL | Status: DC
Start: 2021-09-05 — End: 2021-09-10
  Administered 2021-09-05 – 2021-09-08 (×5): 400 mg
  Filled 2021-09-05 (×12): qty 10

## 2021-09-05 MED ORDER — TESTOSTERONE 20.25 MG/ACT (1.62%) TD GEL
4.0000 | Freq: Every day | TRANSDERMAL | Status: DC
Start: 2021-09-05 — End: 2021-09-05

## 2021-09-06 DIAGNOSIS — Z90410 Acquired total absence of pancreas: Secondary | ICD-10-CM | POA: Diagnosis not present

## 2021-09-06 DIAGNOSIS — R112 Nausea with vomiting, unspecified: Secondary | ICD-10-CM | POA: Diagnosis not present

## 2021-09-06 DIAGNOSIS — R066 Hiccough: Secondary | ICD-10-CM | POA: Diagnosis not present

## 2021-09-06 DIAGNOSIS — Z9049 Acquired absence of other specified parts of digestive tract: Secondary | ICD-10-CM | POA: Diagnosis not present

## 2021-09-06 DIAGNOSIS — G250 Essential tremor: Secondary | ICD-10-CM | POA: Diagnosis not present

## 2021-09-06 DIAGNOSIS — E8589 Other amyloidosis: Secondary | ICD-10-CM | POA: Diagnosis not present

## 2021-09-06 LAB — CBC WITH DIFFERENTIAL/PLATELET
Abs Immature Granulocytes: 0.05 10*3/uL (ref 0.00–0.07)
Basophils Absolute: 0.1 10*3/uL (ref 0.0–0.1)
Basophils Relative: 1 %
Eosinophils Absolute: 0.2 10*3/uL (ref 0.0–0.5)
Eosinophils Relative: 3 %
HCT: 26.7 % — ABNORMAL LOW (ref 39.0–52.0)
Hemoglobin: 8.4 g/dL — ABNORMAL LOW (ref 13.0–17.0)
Immature Granulocytes: 1 %
Lymphocytes Relative: 15 %
Lymphs Abs: 0.9 10*3/uL (ref 0.7–4.0)
MCH: 27 pg (ref 26.0–34.0)
MCHC: 31.5 g/dL (ref 30.0–36.0)
MCV: 85.9 fL (ref 80.0–100.0)
Monocytes Absolute: 0.5 10*3/uL (ref 0.1–1.0)
Monocytes Relative: 7 %
Neutro Abs: 4.4 10*3/uL (ref 1.7–7.7)
Neutrophils Relative %: 73 %
Platelets: 390 10*3/uL (ref 150–400)
RBC: 3.11 MIL/uL — ABNORMAL LOW (ref 4.22–5.81)
RDW: 17.2 % — ABNORMAL HIGH (ref 11.5–15.5)
WBC: 6.1 10*3/uL (ref 4.0–10.5)
nRBC: 0 % (ref 0.0–0.2)

## 2021-09-06 LAB — COMPREHENSIVE METABOLIC PANEL
ALT: 42 U/L (ref 0–44)
AST: 35 U/L (ref 15–41)
Albumin: 2.1 g/dL — ABNORMAL LOW (ref 3.5–5.0)
Alkaline Phosphatase: 532 U/L — ABNORMAL HIGH (ref 38–126)
Anion gap: 9 (ref 5–15)
BUN: 19 mg/dL (ref 8–23)
CO2: 19 mmol/L — ABNORMAL LOW (ref 22–32)
Calcium: 8.1 mg/dL — ABNORMAL LOW (ref 8.9–10.3)
Chloride: 111 mmol/L (ref 98–111)
Creatinine, Ser: 1.07 mg/dL (ref 0.61–1.24)
GFR, Estimated: >60 mL/min (ref >60)
Glucose, Bld: 112 mg/dL — ABNORMAL HIGH (ref 70–99)
Potassium: 3.4 mmol/L — ABNORMAL LOW (ref 3.5–5.1)
Sodium: 139 mmol/L (ref 135–145)
Total Bilirubin: 0.8 mg/dL (ref 0.3–1.2)
Total Protein: 4.7 g/dL — ABNORMAL LOW (ref 6.5–8.1)

## 2021-09-06 LAB — GLUCOSE, CAPILLARY
Glucose-Capillary: 110 mg/dL — ABNORMAL HIGH (ref 70–99)
Glucose-Capillary: 103 mg/dL — ABNORMAL HIGH (ref 70–99)
Glucose-Capillary: 113 mg/dL — ABNORMAL HIGH (ref 70–99)
Glucose-Capillary: 109 mg/dL — ABNORMAL HIGH (ref 70–99)

## 2021-09-06 LAB — CULTURE, BLOOD (ROUTINE X 2): Special Requests: ADEQUATE

## 2021-09-06 MED ORDER — POTASSIUM CHLORIDE 10 MEQ/100ML IV SOLN
10.0000 meq | INTRAVENOUS | Status: AC
  Administered 2021-09-06 (×2): 10 meq via INTRAVENOUS
  Filled 2021-09-06 (×2): qty 100

## 2021-09-06 MED ORDER — METOCLOPRAMIDE HCL 5 MG/ML IJ SOLN
5.0000 mg | Freq: Once | INTRAMUSCULAR | Status: AC
  Administered 2021-09-06: 5 mg via INTRAVENOUS
  Filled 2021-09-06: qty 2

## 2021-09-06 MED ORDER — POTASSIUM CHLORIDE CRYS ER 20 MEQ PO TBCR
40.0000 meq | EXTENDED_RELEASE_TABLET | Freq: Once | ORAL | Status: DC
  Filled 2021-09-06: qty 2

## 2021-09-06 NOTE — Progress Notes (Signed)
[facility]                                                                               Jp Delaney, is a 76 y.o. male, DOB - [birth year], FIE:[id number] Admit date - 09/03/2021    Outpatient Primary MD for the patient is William Penna, MD  LOS - 2  days    Brief summary   William Barron is a 76 y.o. male with medical history significant of DM; HLD; multiple myeloma; amyloidosis; prostate CA; and recent (07/13/21) Whipple procedure for benign pancreatic mass presenting with emesis.   He was discharged May 30, went to [facility] and they felt he was too acute.  He was admitted here at  [facility] for 1 night , then to  [place]  on 6/20. Patient was admitted to [facility] end of April for finding of pancreatic lesion.  According to [name]'s documentation, patient has strong family history of pancreatic cancer, decision was made for resection of pancreas.  Whipple was performed and postop pathology showed IPMN (intraductal papillary mucinous neoplasm ). After surgery, patient was on TPN briefly then JG tube inserted by [name] services.  J tube feeding started without issue, G tube has been used for continuous suction.  [facility] plan was to discharge patient to SNF, but on arrival at SNF, it was found that SNF cannot provide a G-tube with continuous suction.  As result patient sent back to ED, [name] ED was full and no bed available, patient came to [facility] ED.  CT abdomen pelvis postop changes of pancreas no acute findings otherwise. GI and surgery on board. Dr [name] recommends EGD and endoscopic J TUBE repositioning on Monday 6/26.  Pt seen and examined and answered all his questions , spent 35 minutes.    Assessment & Plan    Assessment and Plan:     Nausea, vomiting in the setting of recent Whipple 's,  Possibly secondary to gastroparesis vs overfeeding and sub optimal positioning of the GJ tube.  Pts nausea, vomiting and abdominal pain have resolved.  G  TUBE placed to gravity.  GI and surgery on board.  Dr [name] recommends EGD and endoscopic J TUBE repositioning  scheduled on Monday 6/26.  Dietary consulted.     Gram positive cocci in chains in 1/4 bottles done on admission.  Suspect contaminant, . Watch off antibiotics.    Pneumatosis on the CT abdomen and pelvis:  No abdominal pain. Conservative management. Pt denies any nausea, vomiting.  Lactic acid resolved.  He was initially started on IV zosyn but pt is clinically better. WBC count wnl.  Discontinued the antibiotics.    H/o Intraductal papillary mucinous neoplasm in the pancreatic cyst S/p recent Whipple procedure Currently has GJ tube on nocturnal tube feeds at the SNF and meals during the day.  On PPI BID.  Recommend outpatient follow up with GI at [facility].    Multiple Myeloma:  Recommend outpatient follow up with oncologist. Dr William Barron vs oncologist at [facility].     Diabetes mellitus type 2.  CBG (last 3)  Recent Labs    09/06/21 0827 09/06/21 1239 09/06/21 1533  GLUCAP 110* 103*  113*    Continue with SSI. No changes in meds.     Amyloidosis:  Leading to bulky mediastinal LAD.  Outpatient follow up.   H/o prostate CA S/p radiation in 2022.    Parotid gland lesion.  Outpatient follow up with ENT.    Anemia of chronic disease;  Monitor hemoglobin.    Deconditioning and recurrent admissions; Therapy evaluations ordered.  Palliative care consulted on admission.   Severe malnutrition Dietary consulted.    Hypokalemia:  Replaced.    RN Pressure Injury Documentation: Pressure Injury 09/04/21 Buttocks Mid Stage 1 -  Intact skin with non-blanchable redness of a localized area usually over a bony prominence. redness, intact (Active)  09/04/21 1837  Location: Buttocks  Location Orientation: Mid  Staging: Stage 1 -  Intact skin with non-blanchable redness of a localized area usually over a bony prominence.  Wound Description (Comments):  redness, intact  Present on Admission:   Dressing Type Foam - Lift dressing to assess site every shift 09/06/21 0905   Wound care consulted for recommendations.  Malnutrition Type:  Nutrition Problem: Severe Malnutrition Etiology: acute illness (S/P Whipple procedure May 2023)   Malnutrition Characteristics:  Signs/Symptoms: severe muscle depletion, severe fat depletion, percent weight loss (9% weight loss within one month) Percent weight loss: 9 %   Nutrition Interventions:  Interventions: Tube feeding  Estimated body mass index is 18.97 kg/m as calculated from the following:   Height as of 08/19/21: 5\' 5"  (1.651 m).   Weight as of 08/31/21: 51.7 kg.  Code Status: DNR DVT Prophylaxis:  enoxaparin (LOVENOX) injection 40 mg Start: 09/03/21 1615   Level of Care: Level of care: Med-Surg Family Communication: none at bedside.   Disposition Plan:     Remains inpatient appropriate:  IV antibiotics and possible GJ advancement as per GI.   Procedures: EGD on Monday.  Consultants:   Gastroenterology General surgery.   Antimicrobials:   Anti-infectives (From admission, onward)    Start     Dose/Rate Route Frequency Ordered Stop   09/04/21 1800  piperacillin-tazobactam (ZOSYN) IVPB 3.375 g  Status:  Discontinued        3.375 g 12.5 mL/hr over 240 Minutes Intravenous Every 8 hours 09/04/21 1732 09/06/21 0848   09/04/21 0100  piperacillin-tazobactam (ZOSYN) IVPB 3.375 g  Status:  Discontinued       See Hyperspace for full Linked Orders Report.   3.375 g 12.5 mL/hr over 240 Minutes Intravenous Every 8 hours 09/03/21 1630 09/04/21 1210   09/03/21 1700  piperacillin-tazobactam (ZOSYN) IVPB 3.375 g       See Hyperspace for full Linked Orders Report.   3.375 g 100 mL/hr over 30 Minutes Intravenous  Once 09/03/21 1630 09/04/21 0134        Medications  Scheduled Meds:  enoxaparin (LOVENOX) injection  40 mg Subcutaneous Q24H   gabapentin  100 mg Per Tube Q8H   insulin aspart   0-9 Units Subcutaneous TID WC   megestrol  400 mg Per Tube BID   metoCLOPramide (REGLAN) injection  5 mg Intravenous Once   mouth rinse  15 mL Mouth Rinse 4 times per day   pantoprazole (PROTONIX) IV  40 mg Intravenous Q12H   testosterone  5 g Transdermal Daily   Continuous Infusions:  lactated ringers 75 mL/hr at 09/06/21 1311   PRN Meds:.acetaminophen **OR** acetaminophen, bisacodyl, chlorproMAZINE, hydrALAZINE, melatonin, morphine injection, ondansetron **OR** ondansetron (ZOFRAN) IV, mouth rinse, oxyCODONE, prochlorperazine    Subjective:   Jayleen Joas was seen and  examined today. No new complaints.    Objective:   Vitals:   09/05/21 1934 09/05/21 2000 09/06/21 0429 09/06/21 0830  BP: (!) 129/58 (!) 129/58 (!) 124/51 126/81  Pulse: 74 74 69 81  Resp: 18 18 16 15   Temp: 98.4 F (36.9 C) 98.4 F (36.9 C) 98.1 F (36.7 C) 98 F (36.7 C)  TempSrc: Oral Oral Oral   SpO2: 97% 97% 97% 99%    Intake/Output Summary (Last 24 hours) at 09/06/2021 1625 Last data filed at 09/06/2021 1000 Gross per 24 hour  Intake 494.02 ml  Output 250 ml  Net 244.02 ml    There were no vitals filed for this visit.   Exam General exam: Appears calm and comfortable  Respiratory system: Clear to auscultation. Respiratory effort normal. Cardiovascular system: S1 & S2 heard, RRR. No JVD,  No pedal edema. Gastrointestinal system: Abdomen is nondistended, soft and nontender. Normal bowel sounds heard. Central nervous system: Alert and oriented. No focal neurological deficits. Extremities: Symmetric 5 x 5 power. Skin: MASD. On the back. Psychiatry:Mood & affect appropriate.      Data Reviewed:  I have personally reviewed following labs and imaging studies   CBC Lab Results  Component Value Date   WBC 6.1 09/06/2021   RBC 3.11 (L) 09/06/2021   HGB 8.4 (L) 09/06/2021   HCT 26.7 (L) 09/06/2021   MCV 85.9 09/06/2021   MCH 27.0 09/06/2021   PLT 390 09/06/2021   MCHC 31.5  09/06/2021   RDW 17.2 (H) 09/06/2021   LYMPHSABS 0.9 09/06/2021   MONOABS 0.5 09/06/2021   EOSABS 0.2 09/06/2021   BASOSABS 0.1 09/06/2021     Last metabolic panel Lab Results  Component Value Date   NA 139 09/06/2021   K 3.4 (L) 09/06/2021   CL 111 09/06/2021   CO2 19 (L) 09/06/2021   BUN 19 09/06/2021   CREATININE 1.07 09/06/2021   GLUCOSE 112 (H) 09/06/2021   GFRNONAA >60 09/06/2021   GFRAA >60 07/13/2018   CALCIUM 8.1 (L) 09/06/2021   PHOS 3.9 08/19/2021   PROT 4.7 (L) 09/06/2021   ALBUMIN 2.1 (L) 09/06/2021   LABGLOB 2.7 06/21/2018   AGRATIO 1.3 06/21/2018   BILITOT 0.8 09/06/2021   ALKPHOS 532 (H) 09/06/2021   AST 35 09/06/2021   ALT 42 09/06/2021   ANIONGAP 9 09/06/2021    CBG (last 3)  Recent Labs    09/06/21 0827 09/06/21 1239 09/06/21 1533  GLUCAP 110* 103* 113*       Coagulation Profile: Recent Labs  Lab 09/03/21 0419  INR 1.2      Radiology Studies: No results found.     Kathlen Mody M.D. [facility] 09/06/2021, 4:25 PM  Available via Epic secure chat 7am-7pm After 7 pm, please refer to night coverage provider listed on amion.

## 2021-09-07 ENCOUNTER — Encounter (HOSPITAL_COMMUNITY): Payer: Self-pay | Admitting: Internal Medicine

## 2021-09-07 ENCOUNTER — Inpatient Hospital Stay (HOSPITAL_COMMUNITY): Payer: Medicare Other | Admitting: Anesthesiology

## 2021-09-07 ENCOUNTER — Inpatient Hospital Stay (HOSPITAL_COMMUNITY): Payer: Medicare Other

## 2021-09-07 ENCOUNTER — Encounter (HOSPITAL_COMMUNITY)
Admission: EM | Disposition: A | Discharge status: Home / Self Care | Payer: Self-pay | Source: Home / Self Care | Attending: Internal Medicine

## 2021-09-07 DIAGNOSIS — R112 Nausea with vomiting, unspecified: Secondary | ICD-10-CM | POA: Diagnosis not present

## 2021-09-07 DIAGNOSIS — Z9049 Acquired absence of other specified parts of digestive tract: Secondary | ICD-10-CM | POA: Diagnosis not present

## 2021-09-07 DIAGNOSIS — K9423 Gastrostomy malfunction: Secondary | ICD-10-CM

## 2021-09-07 DIAGNOSIS — I110 Hypertensive heart disease with heart failure: Secondary | ICD-10-CM | POA: Diagnosis not present

## 2021-09-07 DIAGNOSIS — R633 Feeding difficulties, unspecified: Secondary | ICD-10-CM | POA: Diagnosis not present

## 2021-09-07 DIAGNOSIS — Z90410 Acquired total absence of pancreas: Secondary | ICD-10-CM | POA: Diagnosis not present

## 2021-09-07 DIAGNOSIS — I2510 Atherosclerotic heart disease of native coronary artery without angina pectoris: Secondary | ICD-10-CM

## 2021-09-07 DIAGNOSIS — I509 Heart failure, unspecified: Secondary | ICD-10-CM

## 2021-09-07 DIAGNOSIS — E8589 Other amyloidosis: Secondary | ICD-10-CM | POA: Diagnosis not present

## 2021-09-07 HISTORY — PX: ESOPHAGOGASTRODUODENOSCOPY (EGD) WITH PROPOFOL: SHX5813

## 2021-09-07 LAB — GLUCOSE, CAPILLARY
Glucose-Capillary: 118 mg/dL — ABNORMAL HIGH (ref 70–99)
Glucose-Capillary: 122 mg/dL — ABNORMAL HIGH (ref 70–99)
Glucose-Capillary: 88 mg/dL (ref 70–99)

## 2021-09-07 LAB — TYPE AND SCREEN
ABO/RH(D): O POS
Antibody Screen: POSITIVE
Unit division: 0
Unit division: 0

## 2021-09-07 LAB — BPAM RBC
Blood Product Expiration Date: 202307242359
Blood Product Expiration Date: 202307252359
Unit Type and Rh: 5100
Unit Type and Rh: 5100

## 2021-09-07 LAB — BASIC METABOLIC PANEL
Anion gap: 11 (ref 5–15)
BUN: 18 mg/dL (ref 8–23)
CO2: 19 mmol/L — ABNORMAL LOW (ref 22–32)
Calcium: 8 mg/dL — ABNORMAL LOW (ref 8.9–10.3)
Chloride: 111 mmol/L (ref 98–111)
Creatinine, Ser: 1.09 mg/dL (ref 0.61–1.24)
GFR, Estimated: >60 mL/min (ref >60)
Glucose, Bld: 133 mg/dL — ABNORMAL HIGH (ref 70–99)
Potassium: 3.6 mmol/L (ref 3.5–5.1)
Sodium: 141 mmol/L (ref 135–145)

## 2021-09-07 LAB — MAGNESIUM: Magnesium: 1.7 mg/dL (ref 1.7–2.4)

## 2021-09-07 SURGERY — ESOPHAGOGASTRODUODENOSCOPY (EGD) WITH PROPOFOL
Anesthesia: Monitor Anesthesia Care

## 2021-09-07 MED ORDER — LACTATED RINGERS IV SOLN: INTRAVENOUS | Status: DC | PRN

## 2021-09-07 MED ORDER — PROPOFOL 10 MG/ML IV BOLUS
INTRAVENOUS | Status: DC | PRN
  Administered 2021-09-07 (×3): 20 mg via INTRAVENOUS

## 2021-09-07 MED ORDER — SODIUM CHLORIDE 0.9 % IV SOLN: INTRAVENOUS | Status: DC | PRN

## 2021-09-07 MED ORDER — PROPOFOL 500 MG/50ML IV EMUL
INTRAVENOUS | Status: DC | PRN
  Administered 2021-09-07: 150 ug/kg/min via INTRAVENOUS

## 2021-09-07 MED ORDER — SODIUM CHLORIDE 0.9 % IV SOLN: INTRAVENOUS | Status: DC

## 2021-09-07 MED ORDER — IOHEXOL 300 MG/ML  SOLN
100.0000 mL | Freq: Once | INTRAMUSCULAR | Status: AC | PRN
  Administered 2021-09-07: 45 mL

## 2021-09-07 MED ORDER — LIDOCAINE 2% (20 MG/ML) 5 ML SYRINGE
INTRAMUSCULAR | Status: DC | PRN
  Administered 2021-09-07: 40 mg via INTRAVENOUS

## 2021-09-07 MED ORDER — PHENYLEPHRINE 80 MCG/ML (10ML) SYRINGE FOR IV PUSH (FOR BLOOD PRESSURE SUPPORT)
PREFILLED_SYRINGE | INTRAVENOUS | Status: DC | PRN
  Administered 2021-09-07 (×3): 80 ug via INTRAVENOUS

## 2021-09-07 SURGICAL SUPPLY — 15 items

## 2021-09-07 NOTE — Interval H&P Note (Signed)
History and Physical Interval Note:  09/07/2021 9:24 AM  William Barron  has presented today for surgery, with the diagnosis of GJ tube replacement, emesis, post whipple.  The various methods of treatment have been discussed with the patient and family. After consideration of risks, benefits and other options for treatment, the patient has consented to  Procedure(s): ESOPHAGOGASTRODUODENOSCOPY (EGD) WITH PROPOFOL (N/A) as a surgical intervention.  The patient's history has been reviewed, patient examined, no change in status, stable for surgery.  I have reviewed the patient's chart and labs.  Questions were answered to the patient's satisfaction.     [name]

## 2021-09-07 NOTE — Progress Notes (Signed)
PT Cancellation Note  Patient Details Name: William Barron MRN: [id number] DOB: 14-Apr-1945   Cancelled Treatment:    Reason Eval/Treat Not Completed: Patient declined, no reason specified. Patient declined due to fatigue, stating he just slept for one hour last night. He is also awaiting another procedure and requested for PT to return tomorrow.   Donna Bernard, PT, MPT  Ina Homes 09/07/2021, 2:29 PM

## 2021-09-08 ENCOUNTER — Inpatient Hospital Stay: Payer: Self-pay

## 2021-09-08 ENCOUNTER — Encounter (HOSPITAL_COMMUNITY): Payer: Self-pay | Admitting: Internal Medicine

## 2021-09-08 ENCOUNTER — Inpatient Hospital Stay (HOSPITAL_COMMUNITY): Payer: Medicare Other

## 2021-09-08 DIAGNOSIS — E8589 Other amyloidosis: Secondary | ICD-10-CM | POA: Diagnosis not present

## 2021-09-08 DIAGNOSIS — R1110 Vomiting, unspecified: Secondary | ICD-10-CM

## 2021-09-08 DIAGNOSIS — R633 Feeding difficulties, unspecified: Secondary | ICD-10-CM | POA: Diagnosis not present

## 2021-09-08 DIAGNOSIS — R112 Nausea with vomiting, unspecified: Secondary | ICD-10-CM | POA: Diagnosis not present

## 2021-09-08 DIAGNOSIS — R935 Abnormal findings on diagnostic imaging of other abdominal regions, including retroperitoneum: Secondary | ICD-10-CM | POA: Diagnosis not present

## 2021-09-08 DIAGNOSIS — R066 Hiccough: Secondary | ICD-10-CM | POA: Diagnosis not present

## 2021-09-08 DIAGNOSIS — G250 Essential tremor: Secondary | ICD-10-CM | POA: Diagnosis not present

## 2021-09-08 HISTORY — PX: IR REPLC GASTRO/COLONIC TUBE PERCUT W/FLUORO: IMG2333

## 2021-09-08 HISTORY — PX: IR REPLC DUODEN/JEJUNO TUBE PERCUT W/FLUORO: IMG2334

## 2021-09-08 LAB — BASIC METABOLIC PANEL
Anion gap: 13 (ref 5–15)
BUN: 13 mg/dL (ref 8–23)
CO2: 21 mmol/L — ABNORMAL LOW (ref 22–32)
Calcium: 8.3 mg/dL — ABNORMAL LOW (ref 8.9–10.3)
Chloride: 109 mmol/L (ref 98–111)
Creatinine, Ser: 1.12 mg/dL (ref 0.61–1.24)
GFR, Estimated: >60 mL/min (ref >60)
Glucose, Bld: 100 mg/dL — ABNORMAL HIGH (ref 70–99)
Potassium: 3.6 mmol/L (ref 3.5–5.1)
Sodium: 143 mmol/L (ref 135–145)

## 2021-09-08 LAB — GLUCOSE, CAPILLARY
Glucose-Capillary: 124 mg/dL — ABNORMAL HIGH (ref 70–99)
Glucose-Capillary: 75 mg/dL (ref 70–99)
Glucose-Capillary: 78 mg/dL (ref 70–99)
Glucose-Capillary: 89 mg/dL (ref 70–99)
Glucose-Capillary: 95 mg/dL (ref 70–99)

## 2021-09-08 LAB — CULTURE, BLOOD (ROUTINE X 2)
Culture: NO GROWTH
Special Requests: ADEQUATE

## 2021-09-08 LAB — PHOSPHORUS: Phosphorus: 3.1 mg/dL (ref 2.5–4.6)

## 2021-09-08 LAB — MAGNESIUM: Magnesium: 1.8 mg/dL (ref 1.7–2.4)

## 2021-09-08 MED ORDER — INSULIN ASPART 100 UNIT/ML IJ SOLN
0.0000 [IU] | INTRAMUSCULAR | Status: DC
  Administered 2021-09-08 – 2021-09-09 (×2): 2 [IU] via SUBCUTANEOUS
  Administered 2021-09-09 – 2021-09-10 (×5): 3 [IU] via SUBCUTANEOUS

## 2021-09-08 MED ORDER — LACTATED RINGERS IV SOLN: INTRAVENOUS | Status: AC

## 2021-09-08 MED ORDER — ACETAMINOPHEN 325 MG PO TABS: 650.0000 mg | ORAL_TABLET | Freq: Four times a day (QID) | ORAL | Status: DC | PRN

## 2021-09-08 MED ORDER — MAGNESIUM SULFATE 2 GM/50ML IV SOLN
2.0000 g | Freq: Once | INTRAVENOUS | Status: AC
  Administered 2021-09-08: 2 g via INTRAVENOUS
  Filled 2021-09-08: qty 50

## 2021-09-08 MED ORDER — ACETAMINOPHEN 650 MG RE SUPP: 650.0000 mg | Freq: Four times a day (QID) | RECTAL | Status: DC | PRN

## 2021-09-08 MED ORDER — LIDOCAINE VISCOUS HCL 2 % MT SOLN
OROMUCOSAL | Status: DC | PRN
  Administered 2021-09-08: 10 mL via OROMUCOSAL

## 2021-09-08 MED ORDER — ONDANSETRON HCL 4 MG/2ML IJ SOLN: 4.0000 mg | Freq: Four times a day (QID) | INTRAMUSCULAR | Status: DC | PRN

## 2021-09-08 MED ORDER — LIDOCAINE VISCOUS HCL 2 % MT SOLN
OROMUCOSAL | Status: AC
  Filled 2021-09-08: qty 15

## 2021-09-08 MED ORDER — POTASSIUM CHLORIDE 10 MEQ/100ML IV SOLN
10.0000 meq | INTRAVENOUS | Status: AC
  Administered 2021-09-08 (×3): 10 meq via INTRAVENOUS
  Filled 2021-09-08 (×3): qty 100

## 2021-09-08 MED ORDER — TRAVASOL 10 % IV SOLN
INTRAVENOUS | Status: AC
  Filled 2021-09-08: qty 432.6

## 2021-09-08 MED ORDER — IOHEXOL 300 MG/ML  SOLN
100.0000 mL | Freq: Once | INTRAMUSCULAR | Status: AC | PRN
  Administered 2021-09-08: 25 mL

## 2021-09-08 MED ORDER — CHLORHEXIDINE GLUCONATE CLOTH 2 % EX PADS
6.0000 | MEDICATED_PAD | Freq: Every day | CUTANEOUS | Status: DC
  Administered 2021-09-08 – 2021-09-09 (×2): 6 via TOPICAL

## 2021-09-08 MED ORDER — ONDANSETRON HCL 4 MG PO TABS: 4.0000 mg | ORAL_TABLET | Freq: Four times a day (QID) | ORAL | Status: DC | PRN

## 2021-09-08 MED ORDER — SODIUM CHLORIDE 0.9% FLUSH: 10.0000 mL | INTRAVENOUS | Status: DC | PRN

## 2021-09-08 MED ORDER — OXYCODONE HCL 5 MG PO TABS: 5.0000 mg | ORAL_TABLET | ORAL | Status: DC | PRN

## 2021-09-08 NOTE — Plan of Care (Signed)

## 2021-09-08 NOTE — Progress Notes (Addendum)
Daily Rounding Note  09/08/2021, 11:29 AM  LOS: 4 days   SUBJECTIVE:   Chief complaint: J component of GJ tube problematic, not able to stay in position.  Patient denies nausea.  He recalls he had nausea prior to admission last week but it has not been an issue for several days.  Has not had a bowel movement for about 4 days in setting of not getting his usual tube feeds.  Denies abdominal pain. Dr. Lowella Dandy is going to reattempt positioning of J tube today.  Attempt by Dr. Marina Goodell and radiology yesterday unsuccessful.  OBJECTIVE:         Vital signs in last 24 hours:    Temp:  [97.6 F (36.4 C)-98.7 F (37.1 C)] 98.1 F (36.7 C) (06/27 0752) Pulse Rate:  [67-74] 67 (06/27 0752) Resp:  [14-19] 19 (06/27 0752) BP: (123-137)/(55-62) 130/61 (06/27 0752) SpO2:  [98 %-100 %] 100 % (06/27 0752) Last BM Date : 09/05/21 Filed Weights   09/07/21 0827  Weight: 51.7 kg   General: Looks somewhat frail but comfortable.  Not toxic Heart: RRR. Chest: No labored breathing or cough.  Lungs clear. Abdomen: Not tender, soft.  G-tube site benign Extremities: No CCE Neuro/Psych: Alert.  Oriented x3.  Moves all 4 limbs without gross limitations or tremors.  Fluid speech.  Intake/Output from previous day: 06/26 0701 - 06/27 0700 In: 360 [I.V.:300] Out: 835 [Urine:600; Drains:225; Blood:10]  Intake/Output this shift: No intake/output data recorded.  Lab Results: Recent Labs    09/06/21 0402  WBC 6.1  HGB 8.4*  HCT 26.7*  PLT 390   BMET Recent Labs    09/06/21 0402 09/07/21 0501 09/08/21 1042  NA 139 141 143  K 3.4* 3.6 3.6  CL 111 111 109  CO2 19* 19* 21*  GLUCOSE 112* 133* 100*  BUN 19 18 13   CREATININE 1.07 1.09 1.12  CALCIUM 8.1* 8.0* 8.3*   LFT Recent Labs    09/06/21 0402  PROT 4.7*  ALBUMIN 2.1*  AST 35  ALT 42  ALKPHOS 532*  BILITOT 0.8   PT/INR No results for input(s): "LABPROT", "INR" in the last 72  hours. Hepatitis Panel No results for input(s): "HEPBSAG", "HCVAB", "HEPAIGM", "HEPBIGM" in the last 72 hours.  Studies/Results: [place] EKG SITE RITE  Result Date: 09/08/2021 If Site Rite image not attached, placement could not be confirmed due to current cardiac rhythm.  DG Replc Gastro/Jejuno Tube Percut W/Fluoro  Result Date: 09/08/2021 INDICATION: 2 months status post pyloric sparing whipple, displaced G-J tube EXAM: FLUOROSCOPIC GUIDED REPOSITIONING OF G-J TUBE MEDICATIONS: None ANESTHESIA/SEDATION: None CONTRAST:  Water soluble FLUOROSCOPY: Radiation Exposure Index (as provided by the fluoroscopic device): 42.30 mGy Kerma COMPLICATIONS: None immediate. PROCEDURE: Indwelling tube was injected with contrast to evaluate initial placement. The balloon was deflated. Multiple unsuccessful attempts were made using a variety of wires and catheters to redirect the G-J tube into the small bowel. The distal portion of the tube is situated at the pylorus. IMPRESSION: Unsuccessful attempt at repositioning of GJ tube into the small bowel. The distal lumen is at the pylorus. Recommend KUB in AM. Can cautiously attempt utilization. Results communicated to primary team. Procedure performed by [facility], PA-C. Electronically Signed   By: Marliss Coots M.D.   On: 09/08/2021 09:13    ASSESMENT:   Malfunctioning PEG/J-tube.  Multiple problems with functioning of tubes.  6/26 unsuccessful attempt at endoscopic attempt to advance J-tube portion into jejunum.  IR attempted repositioning of GJ tube.  Unfortunately this was unsuccessful.  Dr. Lowella Dandy planning reattempt at positioning J port today.  Pancreatic head lesion.  07/13/2021 s/p Whipple procedure at [facility].  Pathology confirmed extensive amyloidosis in pancreas, GB, lymph node, stomach, duodenum.  Prolonged post hospital course.  Delayed gastric emptying following surgery.  G-tube port at gravity drainage for decompression.  N.p.o. except for  sips/chips.  Amyloidosis.  Involving pancreas, stomach, duodenum, GB jejunum.  In addition to surgical pathology of 5/1, upper endoscopy with jejunal biopsy confirmed this on 07/29/2021   PLAN     Await Dr Manon Hilding attempt at repositioning J port    William Barron  09/08/2021, 11:29 AM Phone [phone]   GI ATTENDING  Interval history and data reviewed.  Plan as noted.  Nothing further to add.  Being seen by surgery.  Could consider surgical jejunostomy if necessary.  Hopefully IR can reposition tube in an optimal position for tube feeds.  William Barron. William Barron., M.D. [facility] Division of Gastroenterology

## 2021-09-08 NOTE — Progress Notes (Addendum)
PHARMACY - TOTAL PARENTERAL NUTRITION CONSULT NOTE  Indication:  Intolerance to EN  Patient Measurements: Height: 5\' 5"  (165.1 cm) Weight: 51.7 kg (113 lb 15.7 oz) IBW/kg (Calculated) : 61.5 TPN AdjBW (KG): 51.7 Body mass index is 18.97 kg/m. Usual Weight: 56.8 kg in May >> now 51.7 kg  Assessment:  47 YOM presented on 6/22 with emesis.  PMH significant for DM, HLD, MM, amyloidosis, prostate cancer and recent Whipple (5/1) procedure for benign pancreatic mass.  Patient has inadequate oral intake for 7 weeks until a week PTA; he was on nocturnal TF during this time through GJ tube.  Underwent EGD for J-tube repositioning on 6/26 but was unsuccessful; IR also unable to advance J-tube.  Patient has been NPO for 6 days and Pharmacy consulted for TPN management until feeding access can be established if possible.  Patient has severe malnutrition per RD assessment.  Glucose / Insulin: hx DM on SSI PTA - CBGs low normal given NPO status (was elevated earlier in the admission).  Used 1 unit SSI in the past 24 hrs Electrolytes: 6/26 labs - lytes WNL except low CO2 (K and Mag low normal) Renal: SCr 1.09, BUN WNL Hepatic: LFTs WNL except alk phos 500s, tbili WNL, albumin 2.1 Intake / Output; MIVF: UOP 0.5 ml/kg/hr, drain , LR at 75 ml/hr GI Imaging: none since TPN initiation GI Surgeries / Procedures: none since TPN initiation  Central access: PICC to be placed TPN start date: 09/08/21  Nutritional Goals:  RD Estimated Needs Total Energy Estimated Needs: 2150-2350 Total Protein Estimated Needs: 100-125 gm Total Fluid Estimated Needs: >/= 2.2 L  Current Nutrition:  NPO  Plan:  Start TPN at 90mL/hr at 1800 (goal rate 85 ml/hr) Electrolytes in TPN: Na 61mEq/L, K 72mEq/L, Ca 50mEq/L, Mg 76mEq/L, and Phos 55mmol/L. Cl:Ac 1:2 Add standard MVI and trace elements to TPN Increase SSI to moderate Q4H as anticipate CBGs to trend up with TPN Reduce LR to 40 mL/hr at 1800 Standard TPN labs and  nursing care orders Monitor CBGs and lytes to advance TPN  Stat labs pending and might not result prior to TPN order cut-off time; supplement outside of TPN as needed  William Barron, PharmD, BCPS, BCCCP 09/08/2021, 11:07 AM  ==================================  Addendum: K 3.6 and Mag 1.8 KCL x 3 runs Mag sulfate 2gm IV F/U AM labs  William Barron, PharmD, BCPS, BCCCP 09/08/2021, 12:22 PM

## 2021-09-09 ENCOUNTER — Encounter (HOSPITAL_COMMUNITY): Payer: Self-pay

## 2021-09-09 DIAGNOSIS — Z515 Encounter for palliative care: Secondary | ICD-10-CM

## 2021-09-09 DIAGNOSIS — R112 Nausea with vomiting, unspecified: Secondary | ICD-10-CM | POA: Diagnosis not present

## 2021-09-09 DIAGNOSIS — E8589 Other amyloidosis: Secondary | ICD-10-CM | POA: Diagnosis not present

## 2021-09-09 DIAGNOSIS — C9000 Multiple myeloma not having achieved remission: Secondary | ICD-10-CM | POA: Diagnosis not present

## 2021-09-09 DIAGNOSIS — Z789 Other specified health status: Secondary | ICD-10-CM

## 2021-09-09 DIAGNOSIS — R066 Hiccough: Secondary | ICD-10-CM | POA: Diagnosis not present

## 2021-09-09 DIAGNOSIS — G250 Essential tremor: Secondary | ICD-10-CM | POA: Diagnosis not present

## 2021-09-09 DIAGNOSIS — Z90410 Acquired total absence of pancreas: Secondary | ICD-10-CM | POA: Diagnosis not present

## 2021-09-09 DIAGNOSIS — Z66 Do not resuscitate: Secondary | ICD-10-CM

## 2021-09-09 DIAGNOSIS — C61 Malignant neoplasm of prostate: Secondary | ICD-10-CM | POA: Diagnosis not present

## 2021-09-09 LAB — COMPREHENSIVE METABOLIC PANEL
ALT: 21 U/L (ref 0–44)
AST: 16 U/L (ref 15–41)
Albumin: 2.1 g/dL — ABNORMAL LOW (ref 3.5–5.0)
Alkaline Phosphatase: 327 U/L — ABNORMAL HIGH (ref 38–126)
Anion gap: 8 (ref 5–15)
BUN: 12 mg/dL (ref 8–23)
CO2: 22 mmol/L (ref 22–32)
Calcium: 7.8 mg/dL — ABNORMAL LOW (ref 8.9–10.3)
Chloride: 109 mmol/L (ref 98–111)
Creatinine, Ser: 0.94 mg/dL (ref 0.61–1.24)
GFR, Estimated: >60 mL/min (ref >60)
Glucose, Bld: 127 mg/dL — ABNORMAL HIGH (ref 70–99)
Potassium: 3.6 mmol/L (ref 3.5–5.1)
Sodium: 139 mmol/L (ref 135–145)
Total Bilirubin: 0.2 mg/dL — ABNORMAL LOW (ref 0.3–1.2)
Total Protein: 4.3 g/dL — ABNORMAL LOW (ref 6.5–8.1)

## 2021-09-09 LAB — CBC
HCT: 26.8 % — ABNORMAL LOW (ref 39.0–52.0)
Hemoglobin: 8.1 g/dL — ABNORMAL LOW (ref 13.0–17.0)
MCH: 26.6 pg (ref 26.0–34.0)
MCHC: 30.2 g/dL (ref 30.0–36.0)
MCV: 88.2 fL (ref 80.0–100.0)
Platelets: 396 10*3/uL (ref 150–400)
RBC: 3.04 MIL/uL — ABNORMAL LOW (ref 4.22–5.81)
RDW: 17.2 % — ABNORMAL HIGH (ref 11.5–15.5)
WBC: 6.9 10*3/uL (ref 4.0–10.5)
nRBC: 0 % (ref 0.0–0.2)

## 2021-09-09 LAB — GLUCOSE, CAPILLARY
Glucose-Capillary: 120 mg/dL — ABNORMAL HIGH (ref 70–99)
Glucose-Capillary: 150 mg/dL — ABNORMAL HIGH (ref 70–99)
Glucose-Capillary: 152 mg/dL — ABNORMAL HIGH (ref 70–99)
Glucose-Capillary: 159 mg/dL — ABNORMAL HIGH (ref 70–99)
Glucose-Capillary: 161 mg/dL — ABNORMAL HIGH (ref 70–99)
Glucose-Capillary: 194 mg/dL — ABNORMAL HIGH (ref 70–99)

## 2021-09-09 LAB — MAGNESIUM: Magnesium: 1.9 mg/dL (ref 1.7–2.4)

## 2021-09-09 LAB — TRIGLYCERIDES: Triglycerides: 135 mg/dL (ref <150)

## 2021-09-09 LAB — PHOSPHORUS: Phosphorus: 2.7 mg/dL (ref 2.5–4.6)

## 2021-09-09 MED ORDER — OXYCODONE HCL 5 MG PO TABS
5.0000 mg | ORAL_TABLET | ORAL | 0 refills | Status: DC | PRN
Start: 2021-09-09 — End: 2021-11-20

## 2021-09-09 MED ORDER — ONDANSETRON HCL 4 MG PO TABS: 4.0000 mg | ORAL_TABLET | Freq: Four times a day (QID) | ORAL | 0 refills | Status: DC | PRN

## 2021-09-09 MED ORDER — MAGNESIUM SULFATE 2 GM/50ML IV SOLN
2.0000 g | Freq: Once | INTRAVENOUS | Status: AC
Start: 2021-09-09 — End: 2021-09-09
  Administered 2021-09-09: 2 g via INTRAVENOUS
  Filled 2021-09-09: qty 50

## 2021-09-09 MED ORDER — LACTATED RINGERS IV SOLN: INTRAVENOUS | Status: DC

## 2021-09-09 MED ORDER — POTASSIUM CHLORIDE 10 MEQ/50ML IV SOLN
10.0000 meq | INTRAVENOUS | Status: AC
  Administered 2021-09-09 (×2): 10 meq via INTRAVENOUS
  Filled 2021-09-09 (×2): qty 50

## 2021-09-09 MED ORDER — TRAVASOL 10 % IV SOLN
INTRAVENOUS | Status: DC
  Filled 2021-09-09: qty 803.4

## 2021-09-09 MED ORDER — POTASSIUM PHOSPHATES 15 MMOLE/5ML IV SOLN
15.0000 mmol | Freq: Once | INTRAVENOUS | Status: AC
  Administered 2021-09-09: 15 mmol via INTRAVENOUS
  Filled 2021-09-09: qty 5

## 2021-09-09 NOTE — Progress Notes (Signed)
Physical Therapy Treatment Patient Details Name: William Barron MRN: [id number] DOB: Nov 02, 1945 Today's Date: 09/09/2021   History of Present Illness Pt is a 76 yr old M admitted on 09/03/21 from SNF for emisis. Pt has been at two SNF facilities in last two weeks and prior to that was on rehab at [facility] after being at [facility] for [place] on 5/1.  Imaging (+) for R parotid gland lesion, suspicious of neoplasm.  PMH: DM, multiple myeloma, tremors, prostate CA, HTN    PT Comments    Pt instructed in and performed therapeutic activities with increased volume of sit to stands. Pt transferred from bed to chair distance of 5 feet with moderate assistance and use of RW. Pt limited by fatigue and unsteadiness. Pt reports disbelief in his decline in mobility.   Recommendations for follow up therapy are one component of a multi-disciplinary discharge planning process, led by the attending physician.  Recommendations may be updated based on patient status, additional functional criteria and insurance authorization.  Follow Up Recommendations  Skilled nursing-short term rehab (<3 hours/day) Can patient physically be transported by private vehicle: Yes   Assistance Recommended at Discharge Frequent or constant Supervision/Assistance  Patient can return home with the following A little help with walking and/or transfers;A little help with bathing/dressing/bathroom;Assistance with cooking/housework;Assist for transportation;Help with stairs or ramp for entrance   Equipment Recommendations  Rolling walker (2 wheels);BSC/3in1    Recommendations for Other Services       Precautions / Restrictions Precautions Precautions: Fall Precaution Comments: JG tube, orthostatic tremors, contact precautions 2/2 ESBL Restrictions Weight Bearing Restrictions: No     Mobility  Bed Mobility Overal bed mobility: Modified Independent Bed Mobility: Supine to Sit     Supine to sit: Modified independent  (Device/Increase time) Sit to supine: Modified independent (Device/Increase time)        Transfers Overall transfer level: Needs assistance Equipment used: Rolling walker (2 wheels) Transfers: Sit to/from Stand Sit to Stand: Min guard, Min assist   Step pivot transfers: Mod assist       General transfer comment: Pt performed 9 total sit <> stands during this encounter (seated rest breaks required 2/2 fatigue, unsteadiness, and knees buckling). Pt performed first two from bed to work on static standnig balance; pt required cues to shift weight to balls of his feet due to left foot rising off ground. Pt performed third sit to stand from bed and performed side stepping at side of bed. Pt performed fourth sit to stand for gait/step pivot transfer to chair. Pt then performed 4 consecutive sit to stands from chair. Lastly, pt performed sit to stand from chair and his balance was challenged via FTEO with hands on RW and then feet shoulder width alternating hands off RW.    Ambulation/Gait Ambulation/Gait assistance: Mod assist Gait Distance (Feet): 5 Feet Assistive device: Rolling walker (2 wheels) Gait Pattern/deviations: Ataxic, Trunk flexed, Decreased step length - right, Decreased step length - left, Step-to pattern Gait velocity: decreased     General Gait Details: Pt required cues for sequencing during gait and step pivot to chair from bed.   Stairs             Wheelchair Mobility    Modified Rankin (Stroke Patients Only)       Balance           Standing balance support: Bilateral upper extremity supported, Single extremity supported Standing balance-Leahy Scale: Poor  Cognition Arousal/Alertness: Awake/alert Behavior During Therapy: WFL for tasks assessed/performed Overall Cognitive Status: Within Functional Limits for tasks assessed                                          Exercises       General Comments        Pertinent Vitals/Pain Pain Assessment Pain Assessment: No/denies pain Pain Score: 0-No pain    Home Living                          Prior Function            PT Goals (current goals can now be found in the care plan section) Acute Rehab PT Goals PT Goal Formulation: With patient Time For Goal Achievement: 09/18/21 Potential to Achieve Goals: Good Progress towards PT goals: Progressing toward goals (slow progress)    Frequency    Min 3X/week      PT Plan Current plan remains appropriate    Co-evaluation              AM-PAC PT "6 Clicks" Mobility   Outcome Measure  Help needed turning from your back to your side while in a flat bed without using bedrails?: None Help needed moving from lying on your back to sitting on the side of a flat bed without using bedrails?: None Help needed moving to and from a bed to a chair (including a wheelchair)?: A Lot Help needed standing up from a chair using your arms (e.g., wheelchair or bedside chair)?: A Little Help needed to walk in hospital room?: A Lot Help needed climbing 3-5 steps with a railing? : Total 6 Click Score: 16    End of Session Equipment Utilized During Treatment: Gait belt Activity Tolerance: Patient limited by fatigue Patient left: in chair;with chair alarm set;with call bell/phone within reach Nurse Communication: Mobility status;Precautions PT Visit Diagnosis: Unsteadiness on feet (R26.81);Muscle weakness (generalized) (M62.81)     Time: 1435-1500 PT Time Calculation (min) (ACUTE ONLY): 25 min  Charges:  $Therapeutic Activity: 23-37 mins                     Donna Bernard, PT    [facility] 09/09/2021, 3:17 PM

## 2021-09-09 NOTE — TOC Progression Note (Signed)
Transition of Care [facility]) - Progression Note    Patient Details  Name: William Barron MRN: [id number] Date of Birth: 05-Nov-1945  [facility] CM/SW [place], [place] Phone Number: 09/09/2021, 4:10 PM  Clinical Narrative:    CSW notified of the need for transportation to appointment tomorrow. Per PTAR Medicare will not cover for that transportation. [facility] transport noting they did not have any openings for AM transport. Medical team agreeable to pt being transported by son. Son is agreeable and questions about process were answered. CSW answered insurance and placement questions, family appreciative. CSW will be available for any further needs.   Expected Discharge Plan: [place] Barriers to Discharge: Continued Medical Work up  Expected Discharge Plan and Services Expected Discharge Plan: [place] In-house Referral: Clinical Social Work   Post Acute Care Choice: [place] Living arrangements for the past 2 months: Single Family Home Expected Discharge Date: 09/10/21                                     Social Determinants of Health (SDOH) Interventions    Readmission Risk Interventions     No data to display

## 2021-09-09 NOTE — Progress Notes (Addendum)
PHARMACY - TOTAL PARENTERAL NUTRITION CONSULT NOTE  Indication:  Intolerance to EN  Patient Measurements: Height: '5\' 5"'  (165.1 cm) Weight: 51.7 kg (113 lb 15.7 oz) IBW/kg (Calculated) : 61.5 TPN AdjBW (KG): 51.7 Body mass index is 18.97 kg/m. Usual Weight: 56.8 kg in May >> now 51.7 kg  Assessment:  36 YOM presented on 6/22 with emesis.  PMH significant for DM, HLD, MM, amyloidosis, prostate cancer and recent Whipple (5/1) procedure for benign pancreatic mass.  Patient has inadequate oral intake for 7 weeks until a week PTA; he was on nocturnal TF during this time through [place] tube.  Underwent EGD for J-tube repositioning on 6/26 but was unsuccessful; IR also unable to advance J-tube.  Patient has been NPO for 6 days and Pharmacy consulted for TPN management until feeding access can be established if possible.  Patient has severe malnutrition per RD assessment.  Glucose / Insulin: hx DM on SSI PTA - CBGs 124-161 with TPN start (was elevated earlier in the admission).  Used 5 unit SSI in the past 24 hrs Electrolytes: Na 139, K 3.6 (s/p Kcl 10 mEq x3), CO2 22, CoCa 9.3, Mg 1.9 (s/p 2g Mg), Phos 2.7. Other electrolytes wnl.  Renal: SCr 0.94 - at baseline.  BUN WNL Hepatic: LFTs WNL except alk phos 327, tbili 0.2, albumin 2.1, TG 135  Intake / Output; MIVF: UOP 0.2 ml/kg/hr, LR at 40 ml/hr with TPN start GI Imaging: none since TPN initiation GI Surgeries / Procedures:  - 6/27: IR for unsuccessful replacement of GJ tube. Replacement of G tube  Central access: 09/08/21 TPN start date: 09/08/21  Nutritional Goals:  RD Estimated Needs Total Energy Estimated Needs: 2150-2350 Total Protein Estimated Needs: 100-125 gm Total Fluid Estimated Needs: >/= 2.2 L  Current Nutrition:  NPO  Plan:  Increase TPN to 55m/hr at 1800 (goal rate 85 ml/hr) to provide 80g of protein and 1662 kcal to meet ~80% of patient's needs  Electrolytes in TPN: Na 766m/L, K 5041mL, Ca 5mE83m, Mg 7mEq21m and Phos  20mmo54m Continue Cl:Ac 1:2 K phos 15 mmol x1 (will provide ~22 mEq of K) KCl 10 mEq x2  Mg 2g x1  Add standard MVI and trace elements to TPN Continue moderate SSI q4h - might need to increase to resistant scale  Stop MIVF with new TPN at 1800  Standard TPN labs and nursing care orders Monitor CBGs and lytes to advance TPN  Plan to discharge and arrange transport to [name] AM per RN for endoscopically guided J tube  Hitomi Slape Cristela FeltmD, BCPS Clinical Pharmacist 09/09/2021 7:13 AM

## 2021-09-09 NOTE — Discharge Summary (Addendum)
Physician Discharge Summary   Patient: William Barron MRN: [id number] DOB: Feb 22, 1946  Admit date:     09/03/2021  Discharge date: 09/10/21  Discharge Physician: Flora Lipps   PCP: [name], MD   Recommendations at discharge:   Further management instructions as per GI clinic at [facility]. Continue TPN for nutrition until enteral route is established.  Discharge Diagnoses: Active Problems:   H/O Whipple procedure   Chronic hiccups   Multiple myeloma (HCC)   Amyloidosis ([place])   Diabetes ([place])   Depression with anxiety   Prostate cancer ([place])   Goals of care, counseling/discussion   Benign essential tremor   Dyslipidemia   Pressure injury of skin   Abnormal CT of the abdomen  Principal Problem (Resolved):   Emesis Resolved Problems:   Vomiting  Hospital Course: William Barron is a 76 y.o. male with past medical history of diabetes mellitus type 2, hyperlipidemia, multiple myeloma, amyloidosis, prostate cancer, recent Whipple's procedure for pancreatic mass presented to hospital with vomiting.  Patient had been to  [facility]  on 6/20/ 23.  He was admitted to the hospital end of the spring for pancreatic lesion and underwent Whipple's procedure.  Postoperative pathology showed IPMN (intraductal papillary mucinous neoplasm ). After surgery, patient was on TPN briefly then JG tube inserted by [name].  J tube feeding was started without issue and G tube had been used for continuous suction.  [facility] plan was to discharge patient to SNF, but on arrival at SNF, it was found that SNF cannot provide a G-tube with continuous suction.  As result patient was sent back to ED, [name] ED was full and no bed available, patient came to [facility] ED. CT abdomen pelvis postop changes of pancreas no acute findings otherwise.   During hospitalization, GI and general surgery was consulted.  GI attempted EGD with endoscopy guided tube repositioning on 09/07/2021 but was unsuccessful so IR was  consulted with failed attempt.  At this time general surgery has recommended further treatment plan at higher center at [facility].  Assessment and Plan:   Nausea, vomiting in the setting of recent Whipple 's,  secondary to possible gastroparesis with sub optimal positioning of the GJ tube.  GI and general surgery on board.  Failed attempt at repositioning of the G-tube by GI during endoscopy.  IR also has failed the procedure.  At this time patient has been initiated on TPN for nutrition.  We will continue with PICC on discharge so that patient can continue to get TPN for nutrition.  Plan is GI intervention at [facility] on 09/10/2021    Gram positive cocci in chains in 1/4 bottles.  Likely contaminant.  Off antibiotic.   H/o Intraductal papillary mucinous neoplasm in the pancreatic cyst S/p recent Whipple procedure.  Patient following GI at [facility] as outpatient.  We will go to GI outpatient after discharge   Multiple Myeloma:  Follow-up with oncology as outpatient.   Diabetes mellitus type 2.  Received sliding scale insulin during hospitalization.  Will resume his insulin regimen from home   Amyloidosis with mediastinal lymphadenopathy.. Will need outpatient follow-up.   H/o prostate CA S/p radiation in 2022.    Parotid gland lesion.  Recommended outpatient follow-up with ENT.   Anemia of chronic disease;  Latest hemoglobin of 8.1.  We will need to monitor as outpatient.   Deconditioning and recurrent admissions; Physical therapy recommended skilled nursing facility.   Hypokalemia:  Replenished.  Latest test potassium 3.6.  Consultants:  GI General surgery Interventional radiology  Procedures performed:  Upper endoscopy Interventional radiology with attempt of replacing gastrostomy tube  Disposition: Home  Diet recommendation:  NPO   continue TPN  DISCHARGE MEDICATION: Allergies as of 09/09/2021   No Active Allergies      Medication List     STOP taking these medications     acetaminophen 325 MG tablet Commonly known as: TYLENOL   alum & mag hydroxide-simeth 270-623-76 MG/5ML suspension Commonly known as: MAALOX/MYLANTA   ferrous sulfate 300 (60 Fe) MG/5ML syrup   phenol 1.4 % Liqd Commonly known as: CHLORASEPTIC   polyethylene glycol 17 g packet Commonly known as: MIRALAX / GLYCOLAX   prochlorperazine 5 MG tablet Commonly known as: COMPAZINE       TAKE these medications    chlorproMAZINE 10 MG tablet Commonly known as: THORAZINE Take 0.5 tablets (5 mg total) by mouth 3 (three) times daily as needed for hiccoughs.   diphenhydrAMINE 25 MG tablet Commonly known as: BENADRYL Take 0.5 tablets (12.5 mg total) by mouth every 6 (six) hours as needed (Hiccups).   feeding supplement (PROSource TF) liquid Place 45 mLs into feeding tube 2 (two) times daily. What changed: Another medication with the same name was changed. Make sure you understand how and when to take each.   feeding supplement (GLUCERNA 1.5 CAL) Liqd At 80 cc/hr from 6 pm to 4 am. What changed:  how much to take how to take this when to take this additional instructions   free water Soln Place 150 mLs into feeding tube every 4 (four) hours.   gabapentin 250 MG/5ML solution Commonly known as: NEURONTIN Place 2 mLs (100 mg total) into feeding tube every 8 (eight) hours.   NovoLOG FlexPen 100 UNIT/ML FlexPen Generic drug: insulin aspart Inject 0-10 Units into the skin 4 (four) times daily. Per sliding scale: CBG     0-200: 0 units CBG 201-250: 2 units CBG 251-300: 4 units CBG 301-350: 6 units CBG 351-400: 8 units CBG   >   400: 10 units   insulin aspart 100 UNIT/ML injection Commonly known as: novoLOG Inject 0-9 Units into the skin every 4 (four) hours.   megestrol 400 MG/10ML suspension Commonly known as: MEGACE Place 10 mLs (400 mg total) into feeding tube 2 (two) times daily.   melatonin 3 MG Tabs tablet Place 2 tablets (6 mg total) into feeding tube at bedtime  as needed. What changed: reasons to take this   mouth rinse Liqd solution 15 mLs by Mouth Rinse route 2 times daily at 12 noon and 4 pm.   ondansetron 4 MG tablet Commonly known as: ZOFRAN Place 1 tablet (4 mg total) into feeding tube every 6 (six) hours as needed for nausea.   oxyCODONE 5 MG immediate release tablet Commonly known as: Oxy IR/ROXICODONE Place 1 tablet (5 mg total) into feeding tube every 4 (four) hours as needed for moderate pain.   pantoprazole sodium 40 mg Commonly known as: PROTONIX Place 40 mg into feeding tube daily. What changed: additional instructions   Testosterone 20.25 MG/ACT (1.62%) Gel Apply 4 Pump topically daily. What changed: Another medication with the same name was removed. Continue taking this medication, and follow the directions you see here.        Follow-up Information     [name], MD.   Specialty: Internal Medicine Contact information: [address] [place] [address] [phone]  Discharge Exam: Filed Weights   09/07/21 0827  Weight: 51.7 kg   General: Thinly built, not in obvious distress HENT:   No scleral pallor or icterus noted. Oral mucosa is moist.  Chest:    Diminished breath sounds bilaterally. No crackles or wheezes.  CVS: S1 &S2 heard. No murmur.  Regular rate and rhythm. Abdomen: Soft, nontender, nondistended.  Bowel sounds are heard.  G-tube with bilious fluid. Extremities: No cyanosis, clubbing or edema.  Peripheral pulses are palpable. Psych: Alert, awake and oriented, normal mood CNS:  No cranial nerve deficits.  Power equal in all extremities.   Skin: Warm and dry.  No rashes noted.   Condition at discharge: good  The results of significant diagnostics from this hospitalization (including imaging, microbiology, ancillary and laboratory) are listed below for reference.   Imaging Studies: IR Replc Gastro/Colonic Tube Percut W/Fluoro  Result Date:  09/08/2021 INDICATION: 76 year old with history of Whipple procedure and malpositioned GJ tube. Need to place the J component within the small bowel. EXAM: 1. Attempted repositioning of GJ tube-unsuccessful 2. Replacement of gastrostomy tube with fluoroscopy MEDICATIONS: None ANESTHESIA/SEDATION: None CONTRAST:  25 mL Omnipaque 300-administered into the gastric lumen. FLUOROSCOPY: Radiation Exposure Index (as provided by the fluoroscopic device): 58 mGy Kerma COMPLICATIONS: None immediate. PROCEDURE: Informed written consent was obtained from the patient after a thorough discussion of the procedural risks, benefits and alternatives. All questions were addressed. Maximal Sterile Barrier Technique was utilized including caps, mask, sterile gowns, sterile gloves, sterile drape, hand hygiene and skin antiseptic. A timeout was performed prior to the initiation of the procedure. Patient was placed supine on the interventional table. The existing GJ tube was prepped and draped in sterile fashion. The balloon was inflated. The GJ tube was removed over a Bentson wire. A variety of catheters including a Kumpe catheter, C2 catheter, Mikaelsson catheter and Rim catheter were used for this procedure. The C2 catheter successfully cannulated the gastrojejunostomy which is very near to the gastrostomy tube entrance site. However, a wire could not be advanced far enough into the small bowel to advance a catheter. Therefore, a new 33 French gastrostomy tube was placed. The balloon was inflated with 7 mL of dilute contrast. Contrast injection confirmed placement in the stomach. Fluoroscopic images were taken and saved for this procedure. FINDINGS: GJ anastomosis identified just medial to the gastrostomy tube entrance site but unable to advance a catheter or wire into the small bowel. IMPRESSION: 1. Unsuccessful attempt at placing a new GJ tube. 2. Replacement of gastrostomy tube. Electronically Signed   By: Markus Daft M.D.   On:  09/08/2021 18:16   [place] EKG SITE RITE  Result Date: 09/08/2021 If Site Rite image not attached, placement could not be confirmed due to current cardiac rhythm.  DG Replc Gastro/Jejuno Tube Percut W/Fluoro  Result Date: 09/08/2021 INDICATION: 2 months status post pyloric sparing whipple, displaced G-J tube EXAM: FLUOROSCOPIC GUIDED REPOSITIONING OF G-J TUBE MEDICATIONS: None ANESTHESIA/SEDATION: None CONTRAST:  Water soluble FLUOROSCOPY: Radiation Exposure Index (as provided by the fluoroscopic device): 63.87 mGy Kerma COMPLICATIONS: None immediate. PROCEDURE: Indwelling tube was injected with contrast to evaluate initial placement. The balloon was deflated. Multiple unsuccessful attempts were made using a variety of wires and catheters to redirect the G-J tube into the small bowel. The distal portion of the tube is situated at the pylorus. IMPRESSION: Unsuccessful attempt at repositioning of GJ tube into the small bowel. The distal lumen is at the pylorus. Recommend KUB in AM. Can cautiously  attempt utilization. Results communicated to primary team. Procedure performed by [employer] [facility], PA-C. Electronically Signed   By: Ruthann Cancer M.D.   On: 09/08/2021 09:13   CT ABDOMEN PELVIS W CONTRAST  Result Date: 09/03/2021 CLINICAL DATA:  Nausea and vomiting. EXAM: CT ABDOMEN AND PELVIS WITH CONTRAST TECHNIQUE: Multidetector CT imaging of the abdomen and pelvis was performed using the standard protocol following bolus administration of intravenous contrast. RADIATION DOSE REDUCTION: This exam was performed according to the departmental dose-optimization program which includes automated exposure control, adjustment of the mA and/or kV according to patient size and/or use of iterative reconstruction technique. CONTRAST:  130m OMNIPAQUE IOHEXOL 300 MG/ML  SOLN COMPARISON:  KUB 08/25/2021, CT abdomen and pelvis with contrast 08/11/2021 FINDINGS: Lower chest: There is bilateral posterior subpleural chronic  interlobular septal thickening/scarring that is similar to prior. No pleural effusion. There is again bulky subcarinal and right paraesophageal lymphadenopathy with scattered calcifications. This measures up to approximately 3.7 cm in AP dimension within the visualized portion, not significantly changed from 08/11/2021. There are again coarsened calcifications within the inferior aspect of the lingula. Heart size is mildly enlarged. Coronary artery calcifications are noted. Hepatobiliary: Smooth liver contours. Status post cholecystectomy unchanged minimal periportal edema versus mild intrahepatic biliary ductal dilatation. No focal liver lesion is seen. Pancreas: Postsurgical changes are again seen of resection of the pancreatic head. Extensive calcifications are again seen within the pancreatic body and tail. Spleen: Normal in size without focal abnormality. Adrenals/Urinary Tract: Normal bilateral adrenal glands. The kidneys enhance uniformly and are symmetric in size without hydronephrosis. There are tiny nonobstructing stones within the mid and lower pole of the left kidney measuring up to 4 mm within the left lower pole, unchanged. Subcentimeter low-attenuation lesions within the upper pole of the left kidney are too small to further characterize and remain unchanged from prior. No follow-up imaging is recommended. No hydronephrosis. No focal urinary bladder wall thickening. Stomach/Bowel: No focal bowel wall thickening. There is concern for pneumatosis within small bowel within the right lower quadrant (axial series 4, images 35 through 58, coronal series 7 images 28 through 69). The terminal ileum is unremarkable. The appendix is not confidently identified however no inflammatory changes are seen around the cecum to indicate secondary signs of acute appendicitis. Note is made of prior Whipple procedure. Gastrojejunostomy tube is again seen with distal tip apparently extending through a gastrojejunal  anastomosis just into the proximal small bowel. Extends less into the small bowel compared to the prior CT. Vascular/Lymphatic: Moderate to high-grade atherosclerotic calcifications. No abdominal aortic aneurysm. No pelvic or retroperitoneal or mesenteric lymphadenopathy is seen. Reproductive: The prostate measures up to 6.1 cm in transverse dimension, moderately to markedly enlarged and unchanged from prior. The seminal vesicles are grossly unremarkable. Other: Midline abdominal postsurgical changes. Mild left inguinal fat and fluid containing hernia is unchanged. No free air or free fluid is seen within the abdomen or pelvis. Musculoskeletal: Grade 1 anterolisthesis of L4 on L5 is unchanged from prior likely secondary to degenerative facet joint changes. Mild-to-moderate multilevel degenerative disc changes. IMPRESSION: Compared to 08/11/2021: Postsurgical changes are again seen of prior Whipple procedure. No evidence of bowel obstruction. 1. There is new pneumatosis within small bowel within the right lower quadrant. No associated bowel wall thickening or inflammatory change. This has a broad etiologic differential, and can be seen with bowel ischemia and infarction, or other benign etiologies including infection, inflammatory bowel disease, connective tissue disorders, chronic pulmonary disease, autoimmune disease, and steroid use.  Recommend clinical correlation for lactic acidosis which would be concerning for bowel ischemia. 2. Partially visualized bulky mediastinal lymphadenopathy is unchanged from prior. Previously this was assumed to be related to the patient's history of amyloidosis versus sarcoidosis. Critical Value/emergent results were called by telephone at the time of interpretation on 09/03/2021 at 11:51 am to provider [facility] , who verbally acknowledged these results. Electronically Signed   By: Yvonne [name] M.D.   On: 09/03/2021 11:52   DG Chest Port 1 View  Result Date:  09/03/2021 CLINICAL DATA:  Hiccups EXAM: PORTABLE CHEST 1 VIEW COMPARISON:  08/11/2021 FINDINGS: Miliary pattern of pulmonary nodules which are longstanding as discussed on chest CT from 2022. Mediastinal and hilar adenopathy with calcifications suggesting granulomatous disease. There is no edema, consolidation, effusion, or pneumothorax. Recent meal with moderate distension of the visible stomach. Artifact from EKG leads. Previously identified feeding tube over the left upper quadrant, only partially covered. IMPRESSION: No acute finding or change from 08/11/2021. Electronically Signed   By: Jorje Guild M.D.   On: 09/03/2021 04:44   IR Mech Remov Obstruc Mat Any Colon Tube W/Fluoro  Result Date: 08/27/2021 INDICATION: Clogged gastrojejunostomy tube EXAM: Gastrojejunostomy tube check and clearance of mechanical obstruction using fluoroscopy MEDICATIONS: None ANESTHESIA/SEDATION: None CONTRAST:  20 mL-administered into the gastric lumen. FLUOROSCOPY: Radiation Exposure Index (as provided by the fluoroscopic device): 1.9 minutes (3 mGy) COMPLICATIONS: None immediate. PROCEDURE: Informed written consent was obtained from the patient after a thorough discussion of the procedural risks, benefits and alternatives. All questions were addressed. Maximal Sterile Barrier Technique was utilized including caps, mask, sterile gowns, sterile gloves, sterile drape, hand hygiene and skin antiseptic. A timeout was performed prior to the initiation of the procedure. The patient was placed supine on the exam table. The mid abdomen was prepped and draped in a standard sterile fashion with inclusion of the existing gastrojejunostomy tube within the sterile field. A Christmas tree adapter was attached to the jejunostomy port and forceful injection of sterile saline cleared the mechanical obstruction. Patency and appropriate location of the jejunostomy and of the catheter was confirmed with injection of contrast material, was  confirmed a patent gastrojejunostomy tube with location within the small bowel. The patient tolerated the procedure well without immediate complication. IMPRESSION: Successful clearance of the mechanical obstruction of the jejunostomy port of the gastrojejunostomy tube. Recommend continued flushing at least 3 times daily and after meals and medication administration to prevent future occurrences. Electronically Signed   By: Albin Felling M.D.   On: 08/27/2021 14:47   DG Abd 1 View  Result Date: 08/25/2021 CLINICAL DATA:  Clogged J port EXAM: ABDOMEN - 1 VIEW COMPARISON:  CT 08/11/2021 FINDINGS: Gastro jejunostomy balloon positioned over the stomach, tip of the catheter overlies left sacrum and is presumably within jejunum in this patient with history of prior Whipple procedure. Position of the tube similar as compared with the CT from May. Small metallic densities in the left lower quadrant are noted. There is a nonobstructed gas pattern with moderate stool. Prostate seeds are noted IMPRESSION: Gastrojejunostomy tube with tip in the left lower quadrant likely within jejunum. There are small metallic densities in the left lower quadrant. Electronically Signed   By: Donavan Foil M.D.   On: 08/25/2021 19:00   MR BRAIN WO CONTRAST  Result Date: 08/13/2021 CLINICAL DATA:  Neuro deficit, stroke suspected EXAM: MRI HEAD WITHOUT CONTRAST TECHNIQUE: Multiplanar, multiecho pulse sequences of the brain and surrounding structures were obtained without intravenous contrast.  COMPARISON:  CT head 02/08/2021, MR head 01/16/2018 FINDINGS: Brain: There is no acute intracranial hemorrhage, extra-axial fluid collection, or acute infarct There is mild global parenchymal volume loss with prominence of the ventricular system and extra-axial CSF spaces. The ventricles are stable in size since 2019. Patchy foci of FLAIR signal abnormality throughout the subcortical and periventricular white matter are overall unchanged, nonspecific  but likely reflecting sequela of chronic white matter microangiopathy. There is no suspicious parenchymal signal abnormality. There is no mass lesion. There is no mass effect or midline shift. Vascular: Normal flow voids. Skull and upper cervical spine: Normal marrow signal. Sinuses/Orbits: Paranasal sinuses are clear. The globes and orbits are unremarkable. Other: There is an ovoid lesion in the region of the right parotid gland measuring 1.4 cm x 1.9 cm (5-2, 10-10). This lesion was likely present on the study from 2019 but is increased in size IMPRESSION: 1. No acute intracranial pathology. 2. Mild parenchymal volume loss and chronic white matter microangiopathy. 3. 1.9 cm lesion in the region of the right parotid gland is increased in size since 2019 suspicious for parotid neoplasm. Recommend nonemergent ENT referral. Electronically Signed   By: Valetta Mole M.D.   On: 08/13/2021 12:40   CT ABDOMEN PELVIS W CONTRAST  Result Date: 08/11/2021 CLINICAL DATA:  Abdominal pain.  Recent Whipple procedure. EXAM: CT ABDOMEN AND PELVIS WITH CONTRAST TECHNIQUE: Multidetector CT imaging of the abdomen and pelvis was performed using the standard protocol following bolus administration of intravenous contrast. RADIATION DOSE REDUCTION: This exam was performed according to the departmental dose-optimization program which includes automated exposure control, adjustment of the mA and/or kV according to patient size and/or use of iterative reconstruction technique. CONTRAST:  151m OMNIPAQUE IOHEXOL 300 MG/ML  SOLN COMPARISON:  None Available. FINDINGS: Lower chest: Bulky partially calcified adenopathy in the mediastinum and bilateral hila, unchanged since prior study. Coronary artery and aortic calcifications. Coarsened calcifications in the lingula. Bibasilar atelectasis. No effusions. Hepatobiliary: Prior cholecystectomy.  No focal hepatic abnormality. Pancreas: Interval resection of previously seen pancreatic head cystic  mass. Pancreatic body and tail remain present where there are extensive calcifications, unchanged since prior study. Spleen: No focal abnormality or ductal dilatation. Adrenals/Urinary Tract: Punctate nonobstructing stones in the left kidney. No ureteral stones or hydronephrosis. Adrenal glands and urinary bladder unremarkable. Stomach/Bowel: Feeding tube within the stomach. Nonobstructive bowel gas pattern. Vascular/Lymphatic: Aortic atherosclerosis. No evidence of aneurysm or adenopathy. Reproductive: Prostate enlargement. Other: No free fluid or free air. Small left inguinal hernia containing fat and fluid. Musculoskeletal: No acute bony abnormality. IMPRESSION: Interval resection of pancreatic head and associated pancreatic head cystic mass. No visible complicating feature. Pancreatic body and tail remain present with extensive calcifications, unchanged since prior study. No complicating postoperative findings. Bulky mediastinal and bilateral hilar adenopathy with stippled calcifications again noted, unchanged since prior study. Aortic atherosclerosis. Prostate enlargement. Electronically Signed   By: KRolm BaptiseM.D.   On: 08/11/2021 21:50   DG Chest Portable 1 View  Result Date: 08/11/2021 CLINICAL DATA:  Cough. EXAM: PORTABLE CHEST 1 VIEW COMPARISON:  Chest x-ray dated March 05, 2021. FINDINGS: The heart size and mediastinal contours are within normal limits. Normal pulmonary vascularity. Diffuse tiny interstitial nodularity is unchanged. Chronic scarring at the left lung base and costophrenic angle. No focal consolidation, pleural effusion, or pneumothorax. No acute osseous abnormality. Small bilateral calcified axillary lymph nodes again noted. Gastrostomy tube projecting over the left upper quadrant. IMPRESSION: 1. No active disease. 2. Similar chronic interstitial lung  disease. Electronically Signed   By: Titus Dubin M.D.   On: 08/11/2021 19:22    Microbiology: Results for orders placed or  performed during the hospital encounter of 09/03/21  Culture, blood (routine x 2)     Status: None   Collection Time: 09/03/21 12:37 PM   Specimen: BLOOD RIGHT FOREARM  Result Value Ref Range Status   Specimen Description BLOOD RIGHT FOREARM  Final   Special Requests   Final    BOTTLES DRAWN AEROBIC AND ANAEROBIC Blood Culture adequate volume   Culture   Final    NO GROWTH 5 DAYS Performed at [facility], [place] [address], [place], [place] [address]    Report Status 09/08/2021 FINAL  Final  Culture, blood (routine x 2)     Status: Abnormal   Collection Time: 09/03/21  1:10 PM   Specimen: BLOOD  Result Value Ref Range Status   Specimen Description BLOOD SITE NOT SPECIFIED  Final   Special Requests   Final    BOTTLES DRAWN AEROBIC AND ANAEROBIC Blood Culture adequate volume   Culture  Setup Time   Final    GRAM POSITIVE RODS ANAEROBIC BOTTLE ONLY CRITICAL RESULT CALLED TO, READ BACK BY AND VERIFIED WITH: PHARMD H. VONDOLEN [id number]  FH CORRECTED RESULTS GRAM POSITIVE RODS PREVIOUSLY REPORTED AS: GRAM POSITIVE COCCI IN CHAINS CORRECTED RESULTS CALLED TO: PHARMD J.MILLEN AT 3532 ON 09/06/2021 BY T.SAAD.    Culture (A)  Final    LACTOBACILLUS FERMENTUM Standardized susceptibility testing for this organism is not available. Performed at [facility], [place] [address] [place], [place] [address]    Report Status 09/06/2021 FINAL  Final  Blood Culture ID Panel (Reflexed)     Status: None   Collection Time: 09/03/21  1:10 PM  Result Value Ref Range Status   Enterococcus faecalis NOT DETECTED NOT DETECTED Final   Enterococcus Faecium NOT DETECTED NOT DETECTED Final   Listeria monocytogenes NOT DETECTED NOT DETECTED Final   Staphylococcus species NOT DETECTED NOT DETECTED Final   Staphylococcus aureus (BCID) NOT DETECTED NOT DETECTED Final   Staphylococcus epidermidis NOT DETECTED NOT DETECTED Final   Staphylococcus lugdunensis NOT DETECTED NOT DETECTED Final    Streptococcus species NOT DETECTED NOT DETECTED Final   Streptococcus agalactiae NOT DETECTED NOT DETECTED Final   Streptococcus pneumoniae NOT DETECTED NOT DETECTED Final   Streptococcus pyogenes NOT DETECTED NOT DETECTED Final   A.calcoaceticus-baumannii NOT DETECTED NOT DETECTED Final   Bacteroides fragilis NOT DETECTED NOT DETECTED Final   Enterobacterales NOT DETECTED NOT DETECTED Final   Enterobacter cloacae complex NOT DETECTED NOT DETECTED Final   Escherichia coli NOT DETECTED NOT DETECTED Final   Klebsiella aerogenes NOT DETECTED NOT DETECTED Final   Klebsiella oxytoca NOT DETECTED NOT DETECTED Final   Klebsiella pneumoniae NOT DETECTED NOT DETECTED Final   Proteus species NOT DETECTED NOT DETECTED Final   Salmonella species NOT DETECTED NOT DETECTED Final   Serratia marcescens NOT DETECTED NOT DETECTED Final   Haemophilus influenzae NOT DETECTED NOT DETECTED Final   Neisseria meningitidis NOT DETECTED NOT DETECTED Final   Pseudomonas aeruginosa NOT DETECTED NOT DETECTED Final   Stenotrophomonas maltophilia NOT DETECTED NOT DETECTED Final   Candida albicans NOT DETECTED NOT DETECTED Final   Candida auris NOT DETECTED NOT DETECTED Final   Candida glabrata NOT DETECTED NOT DETECTED Final   Candida krusei NOT DETECTED NOT DETECTED Final   Candida parapsilosis NOT DETECTED NOT DETECTED Final   Candida tropicalis NOT DETECTED NOT  DETECTED Final   Cryptococcus neoformans/gattii NOT DETECTED NOT DETECTED Final    Comment: Performed at [facility], [place] [address] [place], [place] [address]    Labs: CBC: Recent Labs  Lab 09/03/21 [id number] 09/04/21 0255 09/06/21 0402 09/09/21 0306  WBC 11.2* 8.1 6.1 6.9  NEUTROABS  --   --  4.4  --   HGB 10.6* 8.7* 8.4* 8.1*  HCT 33.5* 28.3* 26.7* 26.8*  MCV 87.5 87.9 85.9 88.2  PLT 466* 373 390 193   Basic Metabolic Panel: Recent Labs  Lab 09/04/21 0255 09/06/21 0402 09/07/21 0501 09/08/21 1042 09/09/21 0306  NA 139 139 141 143  139  K 3.8 3.4* 3.6 3.6 3.6  CL 112* 111 111 109 109  CO2 22 19* 19* 21* 22  GLUCOSE 145* 112* 133* 100* 127*  BUN _0 CREATININE 0.69 1.07 1.09 1.12 0.94  CALCIUM 8.1* 8.1* 8.0* 8.3* 7.8*  MG  --   --  1.7 1.8 1.9  PHOS  --   --   --  3.1 2.7   Liver Function Tests: Recent Labs  Lab 09/03/21 0419 09/04/21 0255 09/06/21 0402 09/09/21 0306  AST 31 35 35 16  ALT 46* 41 42 21  ALKPHOS 614* 504* 532* 327*  BILITOT 0.4 0.6 0.8 0.2*  PROT 5.6* 4.8* 4.7* 4.3*  ALBUMIN 2.6* 2.2* 2.1* 2.1*   CBG: Recent Labs  Lab 09/08/21 1947 09/09/21 0021 09/09/21 0403 09/09/21 0730 09/09/21 1144  GLUCAP 124* 161* 120* 150* 159*    Discharge time spent: greater than 30 minutes.  Signed: Flora Lipps, MD [facility] 09/09/2021

## 2021-09-09 NOTE — Progress Notes (Signed)
[facility]                                                                               William Barron, is a 76 y.o. male, DOB - October 10, 1945, FQM:[id number] Admit date - 09/03/2021    Outpatient Primary MD for the patient is [name], MD  LOS - 5  days  Brief summary/narrative   William Barron is a 76 y.o. male with past medical history of diabetes mellitus type 2, hyperlipidemia, multiple myeloma, amyloidosis, prostate cancer, recent Whipple's procedure for pancreatic mass presented to hospital with vomiting.  Patient had been to  [facility]  on 6/20/ 23.  He was admitted to the hospital end of the spring for pancreatic lesion and underwent Whipple's procedure.  Postoperative pathology showed IPMN (intraductal papillary mucinous neoplasm ). After surgery, patient was on TPN briefly then JG tube inserted by [name].  J tube feeding was started without issue and G tube had been used for continuous suction.  [facility] plan was to discharge patient to SNF, but on arrival at SNF, it was found that SNF cannot provide a G-tube with continuous suction.  As result patient was sent back to ED, [name] ED was full and no bed available, patient came to [facility] ED. CT abdomen pelvis postop changes of pancreas no acute findings otherwise.  During hospitalization, GI and general surgery was consulted.  GI attempted EGD with endoscopy guided tube repositioning on 09/07/2021 but was unsuccessful so IR was consulted with failed attempts.  At this time general surgery has recommended further treatment plan at higher center at [facility].   Assessment & Plan   Principal Problem:   Emesis Active Problems:   H/O Whipple procedure   Chronic hiccups   Multiple myeloma (HCC)   Amyloidosis (HCC)   Diabetes (William Barron)   Depression with anxiety   Prostate cancer (William Barron)   Goals of care, counseling/discussion   Benign essential tremor   Dyslipidemia   Vomiting   Pressure injury of skin   Abnormal  CT of the abdomen    Nausea, vomiting in the setting of recent Whipple 's,  secondary to possible gastroparesis with sub optimal positioning of the GJ tube.  GI and general surgery on board.  Failed attempt at repositioning of the G-tube by GI during endoscopy.  IR also has failed the procedure.  At this time patient has been initiated on TPN for nutrition.  Plan is GI intervention at [facility] tomorrow as per general surgery.  Gram positive cocci in chains in 1/4 bottles.  Likely contaminant.  Off antibiotic.  H/o Intraductal papillary mucinous neoplasm in the pancreatic cyst S/p recent Whipple procedure.  Patient following GI at [facility] as outpatient.  Multiple Myeloma:  Follow-up with oncology as outpatient.  Diabetes mellitus type 2.  Continue sliding scale insulin.  Latest POC glucose of 159.  Amyloidosis with mediastinal lymphadenopathy.. Will need outpatient follow-up.  H/o prostate CA S/p radiation in 2022.   Parotid gland lesion.  Recommended outpatient follow-up with ENT.  Anemia of chronic disease;  Latest hemoglobin of 8.1.  We will continue to monitor.  Deconditioning and recurrent admissions; Physical therapy has been consulted.  Hypokalemia:  Replenished.  Latest test potassium 3.6.  Pressure injury mid buttocks stage I.  Continue ulcer prevention protocol. Pressure Injury 09/04/21 Buttocks Mid Stage 1 -  Intact skin with non-blanchable redness of a localized area usually over a bony prominence. redness, intact (Active)  09/04/21 1837  Location: Buttocks  Location Orientation: Mid  Staging: Stage 1 -  Intact skin with non-blanchable redness of a localized area usually over a bony prominence.  Wound Description (Comments): redness, intact  Present on Admission:   Dressing Type Foam - Lift dressing to assess site every shift 09/09/21 0800   Severe protein calorie malnutrition present on admission as evidenced by severe muscle depletion prior to placing with 9% weight  loss.  Dietitian on board.  Status post PICC line placement and TPN.  Code Status: DNR  DVT Prophylaxis:    SCD.   Level of Care: Level of care: Med-Surg  Family Communication:  Communicated with the  son at bedside.  Disposition Plan:   Home tomorrow.   Remains inpatient appropriate:   n.p.o. status, status post TPN  Procedures:  EGD,  fluoroscopic guided GJ tube repositioning  Consultants:   Gastroenterology General surgery Interventional radiology  Antimicrobials:   Anti-infectives (From admission, onward)    Start     Dose/Rate Route Frequency Ordered Stop   09/04/21 1800  piperacillin-tazobactam (ZOSYN) IVPB 3.375 g  Status:  Discontinued        3.375 g 12.5 mL/hr over 240 Minutes Intravenous Every 8 hours 09/04/21 1732 09/06/21 0848   09/04/21 0100  piperacillin-tazobactam (ZOSYN) IVPB 3.375 g  Status:  Discontinued       See Hyperspace for full Linked Orders Report.   3.375 g 12.5 mL/hr over 240 Minutes Intravenous Every 8 hours 09/03/21 1630 09/04/21 1210   09/03/21 1700  piperacillin-tazobactam (ZOSYN) IVPB 3.375 g       See Hyperspace for full Linked Orders Report.   3.375 g 100 mL/hr over 30 Minutes Intravenous  Once 09/03/21 1630 09/04/21 0134       Medications  Scheduled Meds:  Chlorhexidine Gluconate Cloth  6 each Topical Daily   gabapentin  100 mg Per Tube Q8H   insulin aspart  0-15 Units Subcutaneous Q4H   megestrol  400 mg Per Tube BID   mouth rinse  15 mL Mouth Rinse 4 times per day   pantoprazole (PROTONIX) IV  40 mg Intravenous Q12H   testosterone  5 g Transdermal Daily   Continuous Infusions:  lactated ringers Stopped (09/08/21 1638)   potassium PHOSPHATE IVPB (in mmol) 15 mmol (09/09/21 1158)   TPN ADULT (ION) 35 mL/hr at 09/09/21 0538   TPN ADULT (ION)     PRN Meds:.acetaminophen **OR** acetaminophen, bisacodyl, chlorproMAZINE, hydrALAZINE, lidocaine, melatonin, morphine injection, ondansetron **OR** ondansetron (ZOFRAN) IV, mouth  rinse, oxyCODONE, prochlorperazine, sodium chloride flush    Subjective:   Today, patient was seen and examined at bedside.  Denies any nausea vomiting or abdominal pain.  Patient's family at bedside.  Objective:   Vitals:   09/08/21 0752 09/08/21 1941 09/09/21 0403 09/09/21 0820  BP: 130/61 (!) 134/56 104/63 (!) 123/54  Pulse: 67 65 (!) 59 69  Resp: _0 Temp: 98.1 F (36.7 C) 98.1 F (36.7 C) 98.2 F (36.8 C) 98.2 F (36.8 C)  TempSrc: Oral   Oral  SpO2: 100% 100% 96% 100%  Weight:      Height:        Intake/Output Summary (Last 24 hours) at  09/09/2021 1328 Last data filed at 09/09/2021 0538 Gross per 24 hour  Intake 427.78 ml  Output --  Net 427.78 ml    Filed Weights   09/07/21 0827  Weight: 51.7 kg   Body mass index is 18.97 kg/m.   General: Thinly built, not in obvious distress HENT:   No scleral pallor or icterus noted. Oral mucosa is moist.  Chest:   Diminished breath sounds bilaterally. No crackles or wheezes.  CVS: S1 &S2 heard. No murmur.  Regular rate and rhythm. Abdomen: Soft, nontender, nondistended.  Bowel sounds are heard.  G-tube in place with bilious fluid. Extremities: No cyanosis, clubbing or edema.  Peripheral pulses are palpable. Psych: Alert, awake and oriented, normal mood CNS:  No cranial nerve deficits.  Power equal in all extremities.   Skin: Warm and dry.  No rashes noted.   Data Reviewed:  I have personally reviewed following labs and imaging studies   CBC Lab Results  Component Value Date   WBC 6.9 09/09/2021   RBC 3.04 (L) 09/09/2021   HGB 8.1 (L) 09/09/2021   HCT 26.8 (L) 09/09/2021   MCV 88.2 09/09/2021   MCH 26.6 09/09/2021   PLT 396 09/09/2021   MCHC 30.2 09/09/2021   RDW 17.2 (H) 09/09/2021   LYMPHSABS 0.9 09/06/2021   MONOABS 0.5 09/06/2021   EOSABS 0.2 09/06/2021   BASOSABS 0.1 96/78/9381     Last metabolic panel Lab Results  Component Value Date   NA 139 09/09/2021   K 3.6 09/09/2021   CL 109  09/09/2021   CO2 22 09/09/2021   BUN 12 09/09/2021   CREATININE 0.94 09/09/2021   GLUCOSE 127 (H) 09/09/2021   GFRNONAA >60 09/09/2021   GFRAA >60 07/13/2018   CALCIUM 7.8 (L) 09/09/2021   PHOS 2.7 09/09/2021   PROT 4.3 (L) 09/09/2021   ALBUMIN 2.1 (L) 09/09/2021   LABGLOB 2.7 06/21/2018   AGRATIO 1.3 06/21/2018   BILITOT 0.2 (L) 09/09/2021   ALKPHOS 327 (H) 09/09/2021   AST 16 09/09/2021   ALT 21 09/09/2021   ANIONGAP 8 09/09/2021    CBG (last 3)  Recent Labs    09/09/21 0403 09/09/21 0730 09/09/21 1144  GLUCAP 120* 150* 159*     Coagulation Profile: Recent Labs  Lab 09/03/21 0419  INR 1.2    Radiology Studies: IR Replc Gastro/Colonic Tube Percut W/Fluoro  Result Date: 09/08/2021 INDICATION: 76 year old with history of Whipple procedure and malpositioned GJ tube. Need to place the J component within the small bowel. EXAM: 1. Attempted repositioning of GJ tube-unsuccessful 2. Replacement of gastrostomy tube with fluoroscopy MEDICATIONS: None ANESTHESIA/SEDATION: None CONTRAST:  25 mL Omnipaque 300-administered into the gastric lumen. FLUOROSCOPY: Radiation Exposure Index (as provided by the fluoroscopic device): 58 mGy Kerma COMPLICATIONS: None immediate. PROCEDURE: Informed written consent was obtained from the patient after a thorough discussion of the procedural risks, benefits and alternatives. All questions were addressed. Maximal Sterile Barrier Technique was utilized including caps, mask, sterile gowns, sterile gloves, sterile drape, hand hygiene and skin antiseptic. A timeout was performed prior to the initiation of the procedure. Patient was placed supine on the interventional table. The existing GJ tube was prepped and draped in sterile fashion. The balloon was inflated. The GJ tube was removed over a Bentson wire. A variety of catheters including a Kumpe catheter, C2 catheter, Mikaelsson catheter and Rim catheter were used for this procedure. The C2 catheter  successfully cannulated the gastrojejunostomy which is very near to the gastrostomy tube entrance  site. However, a wire could not be advanced far enough into the small bowel to advance a catheter. Therefore, a new 22 French gastrostomy tube was placed. The balloon was inflated with 7 mL of dilute contrast. Contrast injection confirmed placement in the stomach. Fluoroscopic images were taken and saved for this procedure. FINDINGS: GJ anastomosis identified just medial to the gastrostomy tube entrance site but unable to advance a catheter or wire into the small bowel. IMPRESSION: 1. Unsuccessful attempt at placing a new GJ tube. 2. Replacement of gastrostomy tube. Electronically Signed   By: Markus Daft M.D.   On: 09/08/2021 18:16   [place] EKG SITE RITE  Result Date: 09/08/2021 If Site Rite image not attached, placement could not be confirmed due to current cardiac rhythm.  DG Replc Gastro/Jejuno Tube Percut W/Fluoro  Result Date: 09/08/2021 INDICATION: 2 months status post pyloric sparing whipple, displaced G-J tube EXAM: FLUOROSCOPIC GUIDED REPOSITIONING OF G-J TUBE MEDICATIONS: None ANESTHESIA/SEDATION: None CONTRAST:  Water soluble FLUOROSCOPY: Radiation Exposure Index (as provided by the fluoroscopic device): 63.78 mGy Kerma COMPLICATIONS: None immediate. PROCEDURE: Indwelling tube was injected with contrast to evaluate initial placement. The balloon was deflated. Multiple unsuccessful attempts were made using a variety of wires and catheters to redirect the G-J tube into the small bowel. The distal portion of the tube is situated at the pylorus. IMPRESSION: Unsuccessful attempt at repositioning of GJ tube into the small bowel. The distal lumen is at the pylorus. Recommend KUB in AM. Can cautiously attempt utilization. Results communicated to primary team. Procedure performed by [facility], PA-C. Electronically Signed   By: Ruthann Cancer M.D.   On: 09/08/2021 09:13     Flora Lipps M.D. [facility] 09/09/2021, 1:28 PM  Available via Epic secure chat 7am-7pm After 7 pm, please refer to night coverage provider listed on amion.

## 2021-09-09 NOTE — Progress Notes (Signed)
Stopped by to see the patient.  No complaints or issues.  I met his son and nephew.  They had a number of questions.  His G-tube/J-tube apparatus was removed and replaced for a standard G-tube.  Planning to go to [facility] tomorrow for endoscopically placed direct J-tube.  I provided advice regarding graduated diet and dietary modifications.  Available to them as needed as outpatient.

## 2021-09-09 NOTE — Progress Notes (Addendum)
Patient ID: William Barron, male   DOB: 11/16/1945, 76 y.o.   MRN: [id number] [facility] Surgery Progress Note  2 Days Post-Op  Subjective: CC-  Patients son drew is at bedside.   No new events overnight. IR unable to advance J port of GJ back into the jejunum yesterday but they were able to get 80F G tube in which is draining well this morning. No new abd pain, nausea, or vomiting. +Bm yesterday.   Objective: Vital signs in last 24 hours: Temp:  [98.1 F (36.7 C)-98.2 F (36.8 C)] 98.2 F (36.8 C) (06/28 0820) Pulse Rate:  [59-69] 69 (06/28 0820) Resp:  [14-18] 18 (06/28 0820) BP: (104-134)/(54-63) 123/54 (06/28 0820) SpO2:  [96 %-100 %] 100 % (06/28 0820) Last BM Date : 09/08/21  Intake/Output from previous day: 06/27 0701 - 06/28 0700 In: 427.8 [I.V.:427.8] Out: 200 [Urine:200] Intake/Output this shift: No intake/output data recorded.  PE: Gen:  Alert, NAD Pulm: normal effort Abd: Soft, non-tender, non-distended, G-tube 80F in left hemi-abdomen with clean dressing, no drainage around tube. G tube to gravity with dark bilious effluent  Lab Results:  No results for input(s): "WBC", "HGB", "HCT", "PLT" in the last 72 hours.  BMET Recent Labs    09/08/21 1042 09/09/21 0306  NA 143 139  K 3.6 3.6  CL 109 109  CO2 21* 22  GLUCOSE 100* 127*  BUN 13 12  CREATININE 1.12 0.94  CALCIUM 8.3* 7.8*   PT/INR No results for input(s): "LABPROT", "INR" in the last 72 hours. CMP     Component Value Date/Time   NA 139 09/09/2021 0306   NA 139 04/03/2015 1445   NA 141 07/29/2014 1404   NA 141 04/17/2013 1044   K 3.6 09/09/2021 0306   K 3.8 04/03/2015 1445   K 3.5 07/29/2014 1404   K 4.1 04/17/2013 1044   CL 109 09/09/2021 0306   CL 106 04/03/2015 1445   CL 102 07/29/2014 1404   CO2 22 09/09/2021 0306   CO2 23 04/03/2015 1445   CO2 30 07/29/2014 1404   CO2 28 04/17/2013 1044   GLUCOSE 127 (H) 09/09/2021 0306   GLUCOSE 129 (H) 07/29/2014 1404   BUN 12  09/09/2021 0306   BUN 18 04/03/2015 1445   BUN 16 07/29/2014 1404   BUN 17.3 04/17/2013 1044   CREATININE 0.94 09/09/2021 0306   CREATININE 0.98 03/05/2020 1012   CREATININE 0.99 06/10/2015 1330   CREATININE 1.1 04/17/2013 1044   CALCIUM 7.8 (L) 09/09/2021 0306   CALCIUM 9.4 04/03/2015 1445   CALCIUM 9.4 07/29/2014 1404   CALCIUM 9.8 04/17/2013 1044   PROT 4.3 (L) 09/09/2021 0306   PROT 7.1 04/03/2015 1445   PROT 7.4 07/29/2014 1404   PROT 7.6 04/17/2013 1044   ALBUMIN 2.1 (L) 09/09/2021 0306   ALBUMIN 4.2 04/03/2015 1445   ALBUMIN 4.1 04/17/2013 1044   AST 16 09/09/2021 0306   AST 15 06/21/2018 0852   AST 16 04/17/2013 1044   ALT 21 09/09/2021 0306   ALT 13 06/21/2018 0852   ALT 20 07/29/2014 1404   ALT 17 04/17/2013 1044   ALKPHOS 327 (H) 09/09/2021 0306   ALKPHOS 80 04/03/2015 1445   ALKPHOS 79 07/29/2014 1404   ALKPHOS 89 04/17/2013 1044   BILITOT 0.2 (L) 09/09/2021 0306   BILITOT 0.4 06/21/2018 0852   BILITOT 0.45 04/17/2013 1044   GFRNONAA >60 09/09/2021 0306   GFRNONAA >60 03/05/2020 1012   GFRAA >60 07/13/2018 0004  GFRAA >60 06/21/2018 0852   Lipase     Component Value Date/Time   LIPASE 17 09/03/2021 0419       Studies/Results: IR Replc Gastro/Colonic Tube Percut W/Fluoro  Result Date: 09/08/2021 INDICATION: 76 year old with history of Whipple procedure and malpositioned GJ tube. Need to place the J component within the small bowel. EXAM: 1. Attempted repositioning of GJ tube-unsuccessful 2. Replacement of gastrostomy tube with fluoroscopy MEDICATIONS: None ANESTHESIA/SEDATION: None CONTRAST:  25 mL Omnipaque 300-administered into the gastric lumen. FLUOROSCOPY: Radiation Exposure Index (as provided by the fluoroscopic device): 58 mGy Kerma COMPLICATIONS: None immediate. PROCEDURE: Informed written consent was obtained from the patient after a thorough discussion of the procedural risks, benefits and alternatives. All questions were addressed. Maximal  Sterile Barrier Technique was utilized including caps, mask, sterile gowns, sterile gloves, sterile drape, hand hygiene and skin antiseptic. A timeout was performed prior to the initiation of the procedure. Patient was placed supine on the interventional table. The existing GJ tube was prepped and draped in sterile fashion. The balloon was inflated. The GJ tube was removed over a Bentson wire. A variety of catheters including a Kumpe catheter, C2 catheter, Mikaelsson catheter and Rim catheter were used for this procedure. The C2 catheter successfully cannulated the gastrojejunostomy which is very near to the gastrostomy tube entrance site. However, a wire could not be advanced far enough into the small bowel to advance a catheter. Therefore, a new 18 French gastrostomy tube was placed. The balloon was inflated with 7 mL of dilute contrast. Contrast injection confirmed placement in the stomach. Fluoroscopic images were taken and saved for this procedure. FINDINGS: GJ anastomosis identified just medial to the gastrostomy tube entrance site but unable to advance a catheter or wire into the small bowel. IMPRESSION: 1. Unsuccessful attempt at placing a new GJ tube. 2. Replacement of gastrostomy tube. Electronically Signed   By: Markus Daft M.D.   On: 09/08/2021 18:16   [place] EKG SITE RITE  Result Date: 09/08/2021 If Site Rite image not attached, placement could not be confirmed due to current cardiac rhythm.  DG Replc Gastro/Jejuno Tube Percut W/Fluoro  Result Date: 09/08/2021 INDICATION: 2 months status post pyloric sparing whipple, displaced G-J tube EXAM: FLUOROSCOPIC GUIDED REPOSITIONING OF G-J TUBE MEDICATIONS: None ANESTHESIA/SEDATION: None CONTRAST:  Water soluble FLUOROSCOPY: Radiation Exposure Index (as provided by the fluoroscopic device): 65.03 mGy Kerma COMPLICATIONS: None immediate. PROCEDURE: Indwelling tube was injected with contrast to evaluate initial placement. The balloon was deflated. Multiple  unsuccessful attempts were made using a variety of wires and catheters to redirect the G-J tube into the small bowel. The distal portion of the tube is situated at the pylorus. IMPRESSION: Unsuccessful attempt at repositioning of GJ tube into the small bowel. The distal lumen is at the pylorus. Recommend KUB in AM. Can cautiously attempt utilization. Results communicated to primary team. Procedure performed by [facility], PA-C. Electronically Signed   By: Ruthann Cancer M.D.   On: 09/08/2021 09:13    Anti-infectives: Anti-infectives (From admission, onward)    Start     Dose/Rate Route Frequency Ordered Stop   09/04/21 1800  piperacillin-tazobactam (ZOSYN) IVPB 3.375 g  Status:  Discontinued        3.375 g 12.5 mL/hr over 240 Minutes Intravenous Every 8 hours 09/04/21 1732 09/06/21 0848   09/04/21 0100  piperacillin-tazobactam (ZOSYN) IVPB 3.375 g  Status:  Discontinued       See Hyperspace for full Linked Orders Report.   3.375 g 12.5  mL/hr over 240 Minutes Intravenous Every 8 hours 09/03/21 1630 09/04/21 1210   09/03/21 1700  piperacillin-tazobactam (ZOSYN) IVPB 3.375 g       See Hyperspace for full Linked Orders Report.   3.375 g 100 mL/hr over 30 Minutes Intravenous  Once 09/03/21 1630 09/04/21 0134        Assessment/Plan Postoperative nausea and vomiting Small bowel pneumatosis  S/P whipple 07/13/21 at [place] by Dr. Hyman Hopes  Delayed gastric emptying after Whipple - afebrile, VSS, WBC WNL 5d ago ( repeat CBC this AM pending), normal lactic acid on admission - clinically he is nontender without peritonitis, very low suspicion for bowel ischemia  - CT 09/03/21 reviewed and there is significant gastric distension (compared to CT 5/30 this is increased) consistent with known delayed gastric emptying. J tube has flipped up near [place] anastomosis. - GI unable to reposition J-port endoscopically. IR has been consulted and failed re-positioning of J tube under fluoro 6/26 and in IR suite  6/27. They placed 43F G tube. - Keep G port to gravity drainage for gastric decompression- stomaching is decompressing better this AM w/ over 300 cc in gravity bag after G-tube upsize. Continue TPN for now.   I spoke with the patient and his son td length today about the patients current condition, Unfortunately his GJ tube has not been functioning appropriately and we have been unable to get it repositioned and working appropriately despite our best efforts as above. At this point I believe we have exhausted the non-operative resources we have here at [facility] for getting this patient the enteral access that he needs. Our IR physicians and GI physicians do not perform separate J tubes at this facility and, at this point, I think a G-tube and a dedicated J-tube are in this patients best interest.   While in the room with the patient I had a discussion on the phone with the patients surgeon, Dr. Hyman Hopes, who agreed that his patient would benefit from a J tube. He very expeditiously contacted a GI specialist at [place] who had a cancellation and arranged for an endoscopically guided J tube tomorrow 09/10/21 at 11:00 AM. [name] scheduling has reached out to the patient and asked that [facility] by 10:15 AM. I have held the patients lovenox. His g tube should remain to gravity and he should remain NPO. Social work/case management are working on transportation for tomorrow morning and I have discussed this plan of care with the medical team/Dr.Pokhrel.     ID - zosyn 6/22>>6/25 FEN - IVF, NPO except sips/chips VTE - lovenox   Tthe patients surgical history for pancreatic head mass is as below:  Evaluated for enlarging pancreatic head lesion concerning for IPMN, EUS 03/2021 w/ cystic lesion no solid component and no PD dilation. CEA 418 and amylase 18,200. Tumor makers were normal. Pts father w/ hx pancreatic CA so whipple was recommended.  07/13/21 whipple procedure by Dr.Nussbaum, NG  removed 5/2 Had multiple NG tubes placed for decompression Started on PICC TPN 5/9, stopped 5/15 Failed final PO trial 5/17, NG was placed, then GJ tube placed by GI on 5/18 GJ was clogged on 5/23 and was exchanged by IR [place] flipped up into stomach on 5/26 and was re-advanced by GI  5/30 patient was discharged from [facility] to SNF with G tube to gravity and on goal J-tube feeds 6/7-6/19 CIR, discharged to SNF; per CIR note on date of discharge he was CGA for transfers and to  ambulate 120' w/ RW; was on nocturnal TF and soft diet. 6/22 admitted ber to [facility] with N/V, gastric dilation, J-tube flipped up into pylorus. 6/26 - GI attempted to advance J tube endoscopically but unable to, IR APP attempted under fluoro but unsuccessful.  6/27 IR MD Dr. Anselm Pancoast attempted to advance J-port of Emily without success, left 9F G tube.     LOS: 5 days    [facility] 09/09/2021, 10:08 AM Please see Amion for pager number during day hours 7:00am-4:30pm

## 2021-09-09 NOTE — Progress Notes (Signed)
Daily Progress Note   Patient Name: William Barron       Date: 09/09/2021 DOB: 1945-07-18  Age: 76 y.o. MRN#: [id number] Attending Physician: Flora Lipps, MD Primary Care Physician: [name], MD Admit Date: 09/03/2021  Reason for Consultation/Follow-up: Establishing goals of care  Subjective: Received notification that patient's son/[name] called PMT phone yesterday with questions regarding goals of care.  Chart review performed.  Received report from primary RN -no acute concerns.  Went to visit patient at bedside -son/[name] was present.  Patient was sitting up in chair awake, alert, oriented, and able to participate in conversation.  No signs or non-verbal gestures of pain or discomfort noted. No respiratory distress, increased work of breathing, or secretions noted.  Patient denies pain or other concerns.  Discussed with patient and son their thoughts around transfer to [facility].  They understand that patient has exhausted all nonoperative resources at [facility] for getting the patient enteral access.  Patient thinks being transferred to [place] is a "good plan."  [name] questions whether PMT will be able to follow patient at [facility].  Reviewed that we only service [facility] -encouraged patient/son to request palliative medicine consult once he does arrive to [facility] so they can continue to receive ongoing support.  Education also provided on outpatient palliative care.  We discussed palliative care role in advocacy and assistance with coordination between multiple caregivers and community agencies, as well as symptom management and ongoing goals of care conversations based on his progress and quality of life.    All questions and concerns addressed. Encouraged to call with  questions and/or concerns. PMT card provided.  Length of Stay: 5  Current Medications: Scheduled Meds:   Chlorhexidine Gluconate Cloth  6 each Topical Daily   gabapentin  100 mg Per Tube Q8H   insulin aspart  0-15 Units Subcutaneous Q4H   megestrol  400 mg Per Tube BID   mouth rinse  15 mL Mouth Rinse 4 times per day   pantoprazole (PROTONIX) IV  40 mg Intravenous Q12H   testosterone  5 g Transdermal Daily    Continuous Infusions:  lactated ringers Stopped (09/08/21 1638)   potassium PHOSPHATE IVPB (in mmol) 15 mmol (09/09/21 1158)   TPN ADULT (ION) 35 mL/hr at 09/09/21 0538   TPN ADULT (ION)  PRN Meds: acetaminophen **OR** acetaminophen, bisacodyl, chlorproMAZINE, hydrALAZINE, lidocaine, melatonin, morphine injection, ondansetron **OR** ondansetron (ZOFRAN) IV, mouth rinse, oxyCODONE, prochlorperazine, sodium chloride flush  Physical Exam Vitals and nursing note reviewed.  Constitutional:      General: He is not in acute distress. Pulmonary:     Effort: No respiratory distress.  Skin:    General: Skin is warm and dry.  Neurological:     Mental Status: He is alert and oriented to person, place, and time.     Motor: Weakness present.  Psychiatric:        Attention and Perception: Attention normal.        Behavior: Behavior is cooperative.        Cognition and Memory: Cognition and memory normal.             Vital Signs: BP (!) 123/54 (BP Location: Right Arm)   Pulse 69   Temp 98.2 F (36.8 C) (Oral)   Resp 18   Ht '5\' 5"'  (1.651 m)   Wt 51.7 kg   SpO2 100%   BMI 18.97 kg/m  SpO2: SpO2: 100 % O2 Device: O2 Device: Room Air O2 Flow Rate: O2 Flow Rate (L/min): 2 L/min  Intake/output summary:  Intake/Output Summary (Last 24 hours) at 09/09/2021 1318 Last data filed at 09/09/2021 0538 Gross per 24 hour  Intake 427.78 ml  Output --  Net 427.78 ml   LBM: Last BM Date : 09/08/21 Baseline Weight: Weight: 51.7 kg Most recent weight: Weight: 51.7 kg        Palliative Assessment/Data: PPS 50-60%      Patient Active Problem List   Diagnosis Date Noted   Abnormal CT of the abdomen    Pressure injury of skin 09/05/2021   Vomiting 09/04/2021   Emesis 09/03/2021   Goals of care, counseling/discussion 09/03/2021   Postoperative anemia due to acute blood loss 08/31/2021   Reactive thrombocytosis 08/31/2021   Hypokalemia 08/31/2021   Malnutrition of moderate degree 08/19/2021   Debility 08/19/2021   Jejunostomy tube present ([place])    Asymptomatic bacteriuria 08/15/2021   H/O Whipple procedure 08/13/2021   Chronic hiccups 08/13/2021   Candida esophagitis ([place]) 08/13/2021   Diabetes ([place]) 08/13/2021   Anemia 08/13/2021   Hypoalbuminemia 08/13/2021   Frequent PVCs 08/13/2021   Prolonged QT interval 08/13/2021   Facial droop 08/13/2021   Hormone replacement therapy 08/13/2021   Lesion of parotid gland 08/13/2021   Protein-calorie malnutrition, severe ([place]) 08/13/2021   Feeding difficulties 08/12/2021   Feeding tube dysfunction, initial encounter 08/12/2021   Posterior tibial tendon dysfunction, right 04/15/2021   Posterior tibial tendinitis, right leg    Prostate cancer ([place]) 02/11/2020   Orthostatic tremor 10/01/2019   Educated about COVID-19 virus infection 09/12/2019   Dyslipidemia 09/12/2019   Nonrheumatic pulmonary valve insufficiency 09/12/2019   Red blood cell antibody positive, compatible PRBC difficult to obtain 02/15/2018   Diplopia 01/03/2018   Benign essential tremor 01/03/2018   Pain in joint of right ankle 05/17/2017   Foot pain 05/17/2017   Peroneal tendinitis 05/17/2017   Depression with anxiety 01/03/2017   Insomnia 05/05/2016   Coronary artery calcification seen on CAT scan    DOE (dyspnea on exertion)    Fatigue 02/27/2015   Elevated homocysteine 09/26/2014   Multiple myeloma ([place]) 09/26/2014   Acquired pes planus of right foot 07/02/2014   Posterior tibial tendon dysfunction 07/02/2014   Encounter for  antineoplastic chemotherapy 01/02/2014   Kahler disease ([place]) 01/02/2014   Avitaminosis D  01/02/2014   Amyloidosis ([place]) 03/26/2013   Enlarged lymph node 02/14/2013   Pure hypercholesterolemia 03/29/2011   Memory disorder 09/21/2010   Labile hypertension 09/21/2010    Palliative Care Assessment & Plan   Patient Profile: 76 y.o. male  with past medical history of DM; HLD; multiple myeloma; amyloidosis; prostate CA; and recent (07/13/21) Whipple procedure for pancreatic neoplasm admitted on 09/03/2021 with emesis.    Patient needs advancement of J-tube, also with possible gastroparesis.  PMT has been consulted to assist with goals of care conversation.  Assessment: Principal Problem:   Emesis Active Problems:   Amyloidosis ([place])   Multiple myeloma ([place])   Depression with anxiety   Benign essential tremor   Dyslipidemia   Prostate cancer ([place])   H/O Whipple procedure   Chronic hiccups   Diabetes ([place])   Goals of care, counseling/discussion   Vomiting   Pressure injury of skin   Abnormal CT of the abdomen   Recommendations/Plan: Continue full scope treatment Continue DNR/DNI as previously documented - durable DNR form on shadow chart. Copy was made and will be scanned into Vynca/ACP tab Patient is ready for transfer to [name] to proceed with endoscopy guided J-tube tomorrow 6/29 Encouraged patient/family to request Palliative Medicine consult at [facility]. Also recommend outpatient Palliative Care to follow PMT will continue to follow peripherally. If there are any imminent needs please call the service directly  Goals of Care and Additional Recommendations: Limitations on Scope of Treatment: Full Scope Treatment  Code Status:    Code Status Orders  (From admission, onward)           Start     Ordered   09/03/21 1607  Do not attempt resuscitation (DNR)  Continuous       Question Answer Comment  In the event of cardiac or respiratory ARREST Do not call a "code blue"   In  the event of cardiac or respiratory ARREST Do not perform Intubation, CPR, defibrillation or ACLS   In the event of cardiac or respiratory ARREST Use medication by any route, position, wound care, and other measures to relive pain and suffering. May use oxygen, suction and manual treatment of airway obstruction as needed for comfort.      09/03/21 1609           Code Status History     Date Active Date Inactive Code Status Order ID Comments User Context   08/19/2021 1511 08/31/2021 2106 Full Code [id number]  Flora Lipps Inpatient   08/12/2021 1353 08/19/2021 1506 Full Code [id number]  Lequita Halt, MD ED   04/15/2021 1344 04/17/2021 1549 Full Code [id number]  Newt Minion, MD Inpatient   08/27/2013 1154 08/28/2013 0336 Full Code [id number]  Jacqulynn Cadet, MD HOV   05/01/2013 1112 05/02/2013 0335 Full Code [id number]  Arne Cleveland, MD HOV      Advance Directive Documentation    Flowsheet Row Most Recent Value  Type of Advance Directive Healthcare Power of Attorney, Living will  Pre-existing out of facility DNR order (yellow form or pink MOST form) --  "MOST" Form in Place? --       Prognosis:  Unable to determine  Discharge Planning: [facility] transfer  Care plan was discussed with primary RN, patient's son, patient  Thank you for allowing the Palliative Medicine Team to assist in the care of this patient.   Total Time 40 minutes Prolonged Time Billed  no       Greater than 50%  of  this time was spent counseling and coordinating care related to the above assessment and plan.  Lin Landsman, NP  Please contact Palliative Medicine Team phone at [phone] for questions and concerns.   *Portions of this note are a verbal dictation therefore any spelling and/or grammatical errors are due to the "[place] [employer]" system interpretation.

## 2021-09-10 LAB — COMPREHENSIVE METABOLIC PANEL
ALT: 19 U/L (ref 0–44)
AST: 14 U/L — ABNORMAL LOW (ref 15–41)
Albumin: 2.1 g/dL — ABNORMAL LOW (ref 3.5–5.0)
Alkaline Phosphatase: 301 U/L — ABNORMAL HIGH (ref 38–126)
Anion gap: 5 (ref 5–15)
BUN: 11 mg/dL (ref 8–23)
CO2: 25 mmol/L (ref 22–32)
Calcium: 8.1 mg/dL — ABNORMAL LOW (ref 8.9–10.3)
Chloride: 108 mmol/L (ref 98–111)
Creatinine, Ser: 0.82 mg/dL (ref 0.61–1.24)
GFR, Estimated: >60 mL/min (ref >60)
Glucose, Bld: 183 mg/dL — ABNORMAL HIGH (ref 70–99)
Potassium: 3.7 mmol/L (ref 3.5–5.1)
Sodium: 138 mmol/L (ref 135–145)
Total Bilirubin: 0.5 mg/dL (ref 0.3–1.2)
Total Protein: 4.5 g/dL — ABNORMAL LOW (ref 6.5–8.1)

## 2021-09-10 LAB — CBC
HCT: 26.2 % — ABNORMAL LOW (ref 39.0–52.0)
Hemoglobin: 8.3 g/dL — ABNORMAL LOW (ref 13.0–17.0)
MCH: 27.6 pg (ref 26.0–34.0)
MCHC: 31.7 g/dL (ref 30.0–36.0)
MCV: 87 fL (ref 80.0–100.0)
Platelets: 365 10*3/uL (ref 150–400)
RBC: 3.01 MIL/uL — ABNORMAL LOW (ref 4.22–5.81)
RDW: 17.4 % — ABNORMAL HIGH (ref 11.5–15.5)
WBC: 7 10*3/uL (ref 4.0–10.5)
nRBC: 0 % (ref 0.0–0.2)

## 2021-09-10 LAB — GLUCOSE, CAPILLARY
Glucose-Capillary: 186 mg/dL — ABNORMAL HIGH (ref 70–99)
Glucose-Capillary: 195 mg/dL — ABNORMAL HIGH (ref 70–99)
Glucose-Capillary: 203 mg/dL — ABNORMAL HIGH (ref 70–99)

## 2021-09-10 LAB — PHOSPHORUS: Phosphorus: 3.1 mg/dL (ref 2.5–4.6)

## 2021-09-10 LAB — TRIGLYCERIDES: Triglycerides: 129 mg/dL (ref <150)

## 2021-09-10 LAB — MAGNESIUM: Magnesium: 2.1 mg/dL (ref 1.7–2.4)

## 2021-09-10 NOTE — Plan of Care (Signed)

## 2021-09-10 NOTE — Progress Notes (Signed)
Patient stable for discharge this am. Will be going to [facility] outpatient center for endoscopy procedure. Spoke with the son at bedside. Please refer to discharge instructions dated 09/09/21

## 2021-09-16 ENCOUNTER — Telehealth: Payer: Self-pay

## 2021-09-16 NOTE — Telephone Encounter (Signed)
Spoke with patient's spouse [name].regarding Palliative Care services. She states patient is currently admitted at [facility] with plans to discharge to [facility] for rehab. Email sent to hospital liaison to follow.

## 2021-09-18 ENCOUNTER — Inpatient Hospital Stay: Payer: Medicare Other | Admitting: Registered Nurse

## 2021-09-22 ENCOUNTER — Encounter: Payer: Medicare Other | Attending: Registered Nurse | Admitting: Registered Nurse

## 2021-10-19 ENCOUNTER — Ambulatory Visit (INDEPENDENT_AMBULATORY_CARE_PROVIDER_SITE_OTHER): Payer: Medicare Other | Admitting: Orthopedic Surgery

## 2021-10-19 DIAGNOSIS — M76821 Posterior tibial tendinitis, right leg: Secondary | ICD-10-CM

## 2021-10-19 DIAGNOSIS — M21371 Foot drop, right foot: Secondary | ICD-10-CM

## 2021-10-20 ENCOUNTER — Encounter: Payer: Self-pay | Admitting: Orthopedic Surgery

## 2021-10-20 NOTE — Progress Notes (Signed)
Office Visit Note   Patient: William Barron           Date of Birth: 31-Jul-1945           MRN: [id number] Visit Date: 10/19/2021              Requested by: [name], MD [address] [place],  [place] [address] PCP: [name], MD  Chief Complaint  Patient presents with   Right Ankle - Edema      HPI: Patient is a 76 year old gentleman who is seen for initial evaluation for acute foot drop on the right.  Patient is status post right subtalar and talonavicular fusion in February 2023.  Patient recently had a Whipple surgery at [facility].  Patient states he had multiple postoperative complications.  He was in the ICU at [facility] for a month he was at [facility] for a month and is currently been in rehab at [facility] for a month.  Patient states that he has had foot drop since his surgery and cannot walk.  Assessment & Plan: Visit Diagnoses:  1. Foot drop, right   2. Posterior tibial tendinitis, right leg     Plan: Prescription provided for a knee-high compression sock for the venous and lymphatic insufficiency.  A prescription was written for [facility] for an AFO on the right either anterior or posterior.  An order was placed for physical therapy upstairs for gait training.  Follow-Up Instructions: Return in about 4 weeks (around 11/16/2021).   Ortho Exam  Patient is alert, oriented, no adenopathy, well-dressed, normal affect, normal respiratory effort. Examination patient has venous and lymphatic insufficiency and pitting edema of the foot and ankle and calf of the right lower extremity there is no cellulitis no tenderness to palpation no evidence of infection or DVT.  Patient has good active plantarflexion dorsiflexion of the left foot right foot he has good plantarflexion strength but he has no EHL or dorsiflexion strength of the ankle with foot drop.  There is no sciatic tension signs.  Imaging: No results found. No images are attached to the  encounter.  Labs: Lab Results  Component Value Date   HGBA1C 7.8 (H) 04/10/2021   REPTSTATUS 09/06/2021 FINAL 09/03/2021   CULT (A) 09/03/2021    LACTOBACILLUS FERMENTUM Standardized susceptibility testing for this organism is not available. Performed at [facility], [place] [address], [place], [place] [address]    LABORGA KLEBSIELLA PNEUMONIAE (A) 08/11/2021   LABORGA CITROBACTER FREUNDII (A) 08/11/2021     Lab Results  Component Value Date   ALBUMIN 2.1 (L) 09/10/2021   ALBUMIN 2.1 (L) 09/09/2021   ALBUMIN 2.1 (L) 09/06/2021    Lab Results  Component Value Date   MG 2.1 09/10/2021   MG 1.9 09/09/2021   MG 1.8 09/08/2021   Lab Results  Component Value Date   VD25OH 24.1 (L) 09/26/2014    No results found for: "PREALBUMIN"    Latest Ref Rng & Units 09/10/2021    4:32 AM 09/09/2021    3:06 AM 09/06/2021    4:02 AM  CBC EXTENDED  WBC 4.0 - 10.5 K/uL 7.0  6.9  6.1   RBC 4.22 - 5.81 MIL/uL 3.01  3.04  3.11   Hemoglobin 13.0 - 17.0 g/dL 8.3  8.1  8.4   HCT 39.0 - 52.0 % 26.2  26.8  26.7   Platelets 150 - 400 K/uL 365  396  390   NEUT# 1.7 - 7.7 K/uL  4.4   Lymph# 0.7 - 4.0 K/uL   0.9      There is no height or weight on file to calculate BMI.  Orders:  Orders Placed This Encounter  Procedures   Ambulatory referral to Physical Therapy   No orders of the defined types were placed in this encounter.    Procedures: No procedures performed  Clinical Data: No additional findings.  ROS:  All other systems negative, except as noted in the HPI. Review of Systems  Objective: Vital Signs: There were no vitals taken for this visit.  Specialty Comments:  No specialty comments available.  PMFS History: Patient Active Problem List   Diagnosis Date Noted   Abnormal CT of the abdomen    Pressure injury of skin 09/05/2021   Goals of care, counseling/discussion 09/03/2021   Postoperative anemia due to acute blood loss 08/31/2021   Reactive  thrombocytosis 08/31/2021   Hypokalemia 08/31/2021   Malnutrition of moderate degree 08/19/2021   Debility 08/19/2021   Jejunostomy tube present ([place])    Asymptomatic bacteriuria 08/15/2021   H/O Whipple procedure 08/13/2021   Chronic hiccups 08/13/2021   Candida esophagitis ([place]) 08/13/2021   Diabetes ([place]) 08/13/2021   Anemia 08/13/2021   Hypoalbuminemia 08/13/2021   Frequent PVCs 08/13/2021   Prolonged QT interval 08/13/2021   Facial droop 08/13/2021   Hormone replacement therapy 08/13/2021   Lesion of parotid gland 08/13/2021   Protein-calorie malnutrition, severe ([place]) 08/13/2021   Feeding difficulties 08/12/2021   Feeding tube dysfunction, initial encounter 08/12/2021   Posterior tibial tendon dysfunction, right 04/15/2021   Posterior tibial tendinitis, right leg    Prostate cancer ([place]) 02/11/2020   Orthostatic tremor 10/01/2019   Educated about COVID-19 virus infection 09/12/2019   Dyslipidemia 09/12/2019   Nonrheumatic pulmonary valve insufficiency 09/12/2019   Red blood cell antibody positive, compatible PRBC difficult to obtain 02/15/2018   Diplopia 01/03/2018   Benign essential tremor 01/03/2018   Pain in joint of right ankle 05/17/2017   Foot pain 05/17/2017   Peroneal tendinitis 05/17/2017   Depression with anxiety 01/03/2017   Insomnia 05/05/2016   Coronary artery calcification seen on CAT scan    DOE (dyspnea on exertion)    Fatigue 02/27/2015   Elevated homocysteine 09/26/2014   Multiple myeloma ([place]) 09/26/2014   Acquired pes planus of right foot 07/02/2014   Posterior tibial tendon dysfunction 07/02/2014   Encounter for antineoplastic chemotherapy 01/02/2014   Kahler disease ([place]) 01/02/2014   Avitaminosis D 01/02/2014   Amyloidosis ([place]) 03/26/2013   Enlarged lymph node 02/14/2013   Pure hypercholesterolemia 03/29/2011   Memory disorder 09/21/2010   Labile hypertension 09/21/2010   Past Medical History:  Diagnosis Date   Amyloidosis ([place])     Diabetes mellitus without complication ([place])    GERD (gastroesophageal reflux disease)    Hearing loss    Has hearing aids   History of kidney stones    Hyperlipidemia    Multiple myeloma ([facility])    and amylodosis    Nephrolithiasis    Occasional tremors    Pneumonia    Prostate cancer ([place])    Prostatitis    acute and chronic   Skin cancer     Family History  Problem Relation Age of Onset   Pancreatic cancer Father    Prostate cancer Father    Parkinsonism Mother    CAD Brother 28   Cancer Paternal Grandmother    Breast cancer Neg Hx    Colon cancer Neg Hx  Past Surgical History:  Procedure Laterality Date   APPENDECTOMY     CARDIAC CATHETERIZATION N/A 06/13/2015   Procedure: Left Heart Cath and Coronary Angiography;  Surgeon: Jettie Booze, MD;  Location: [facility];  Service: Cardiovascular;  Laterality: N/A;   COLONOSCOPY  04/07/2000   ELBOW SURGERY  03/15/2001   right   ESOPHAGOGASTRODUODENOSCOPY (EGD) WITH PROPOFOL N/A 09/07/2021   Procedure: ESOPHAGOGASTRODUODENOSCOPY (EGD) WITH PROPOFOL;  Surgeon: Irene Shipper, MD;  Location: [facility];  Service: Gastroenterology;  Laterality: N/A;   EXTRACORPOREAL SHOCK WAVE LITHOTRIPSY Left 07/17/2018   Procedure: EXTRACORPOREAL SHOCK WAVE LITHOTRIPSY (ESWL);  Surgeon: [name], MD;  Location: [facility];  Service: Urology;  Laterality: Left;   EXTRACORPOREAL SHOCK WAVE LITHOTRIPSY Right 12/08/2020   Procedure: RIGHT EXTRACORPOREAL SHOCK WAVE LITHOTRIPSY (ESWL);  Surgeon: Raynelle Bring, MD;  Location[facility];  Service: Urology;  Laterality: Right;   FOOT ARTHRODESIS Right 04/15/2021   Procedure: RIGHT SUBTALAR AND TALONAVICULAR FUSION;  Surgeon: Newt Minion, MD;  Location: [facility];  Service: Orthopedics;  Laterality: Right;   HERNIA REPAIR     IR [facility] REMOV OBSTRUC MAT ANY COLON TUBE W/FLUORO  08/27/2021   IR [place] GASTRO/COLONIC TUBE PERCUT W/FLUORO  09/08/2021   ROTATOR CUFF REPAIR      WHIPPLE PROCEDURE  07/13/2021   Social History   Occupational History   Occupation: retired  Tobacco Use   Smoking status: Former    Packs/day: 3.00    Years: 20.00    Total pack years: 60.00    Types: Cigarettes    Quit date: 03/15/1977    Years since quitting: 44.6   Smokeless tobacco: Never   Tobacco comments:    quit 35 years ago  Vaping Use   Vaping Use: Never used  Substance and Sexual Activity   Alcohol use: Yes    Alcohol/week: 3.0 standard drinks of alcohol    Types: 3 Glasses of wine per week    Comment: approx 1 drink/night   Drug use: No   Sexual activity: Not Currently    Birth control/protection: None

## 2021-10-21 ENCOUNTER — Non-Acute Institutional Stay: Payer: Self-pay | Admitting: Internal Medicine

## 2021-10-21 VITALS — HR 92 | Temp 98.1°F | Resp 20 | Wt 125.2 lb

## 2021-10-21 DIAGNOSIS — E43 Unspecified severe protein-calorie malnutrition: Secondary | ICD-10-CM

## 2021-10-21 DIAGNOSIS — R5381 Other malaise: Secondary | ICD-10-CM

## 2021-10-21 DIAGNOSIS — C253 Malignant neoplasm of pancreatic duct: Secondary | ICD-10-CM

## 2021-10-21 DIAGNOSIS — Z9049 Acquired absence of other specified parts of digestive tract: Secondary | ICD-10-CM

## 2021-10-21 DIAGNOSIS — Z934 Other artificial openings of gastrointestinal tract status: Secondary | ICD-10-CM

## 2021-10-21 DIAGNOSIS — Z90410 Acquired total absence of pancreas: Secondary | ICD-10-CM

## 2021-10-21 DIAGNOSIS — Z515 Encounter for palliative care: Secondary | ICD-10-CM

## 2021-10-21 NOTE — Progress Notes (Signed)
[facility] Consult Note Telephone: [phone]  Fax: [phone]   Date of encounter: 10/21/21 8:40 AM PATIENT NAME: William Barron [address] [place] [address]   [phone] (home)  DOB: February 11, 1946 MRN: [id number] PRIMARY CARE PROVIDER:    [name], MD,  [place] [address] [phone]  REFERRING PROVIDER:   Velna Barron, [address] [place],  [name] [address] [phone]  RESPONSIBLE PARTY:    Contact Information     Name Relation Home Work Mobile   William Barron Spouse [phone]  [phone]   William Barron   [phone]        I met face to face with patient and family in [facility]. Palliative Care was asked to follow this patient by consultation request of  William Hatchet, MD to address advance care planning and complex medical decision making. This is the initial visit.                                     ASSESSMENT AND PLAN / RECOMMENDATIONS:   Advance Care Planning/Goals of Care: Goals include to maximize quality of life and symptom management. Patient/health care surrogate gave his/her permission to discuss.Our advance care planning conversation included a discussion about:    The value and importance of advance care planning  Experiences with loved ones who have been seriously ill or have died  Exploration of personal, cultural or spiritual beliefs that might influence medical decisions  Exploration of goals of care in the event of a sudden injury or illness  Identification  of a healthcare agent--wife, William Barron Review and updating or creation of an  advance directive document . Decision not to resuscitate or to de-escalate disease focused treatments due to poor prognosis. CODE STATUS:  DNR confirmed with pt and goldenrod form completed for [facility] chart; MOST provided to patient to review for further discussion upon d/c  home to community--gave him my card to call us when he is ready to do this and be seen outpatient.  He realizes he should prepare for the future.  He was overwhelmed with several staff members meeting with him and a care plan meeting for the afternoon so he did not feel up to further discussion at this time and was surprised I was there to see him.  Symptom Management/Plan: 1. Malignant intraductal papillary mucinous neoplasm ([facility]) -s/p whipple, f/u with oncology for further plans  2. H/O Whipple procedure -recovering from surgery, continues on creon, humalogy -not using oxycodone for pain at this time  3. Severe protein-calorie malnutrition (William Barron) -remains on glucerna tid; is gaining wt with this and three meals per day plus snacks (loves cheetos and anything with cheese)  4. Jejunostomy tube present ([facility]) -due to #3 and pancreatic ca; flushes in place, monitor   5. Debility -has made great strides with functional status as far as daily activities, remains nonambulatory at this time so will benefit from ongoing PT at home  6.  Palliative care:  introduced palliative vs hospice, Manufacturing engineer, role in care, discussed ACP docs and completed DNR with him, provided MOST and background for it for future completion, discussed and reviewed current goals to gain weight and return home with assistance there   Follow up Palliative Care Visit: Palliative care will continue to follow for complex medical decision making, advance care planning, and clarification of goals. Return when d/c'd  home  This visit was coded based on medical decision making (MDM). 17 mins on ACP.  PPS: strong 40%  HOSPICE ELIGIBILITY/DIAGNOSIS: Yes if discontinues aggressive care/Pancreatic cancer (also h/o prostate ca and myeloma, amyloidosis)  Chief Complaint: Initial palliative care consult at [facility] PRESENT ILLNESS:  William Barron is a 76 y.o. year old male  with prostate cancer s/p XRT in  2022, multiple myeloma, AL amyloidosis involving pancreas, stomach, duodenum, gallbladder, jejunum in 2015, pulmonary htn, essential tremor, memory disorder, orthostatisc tremor, intraductal papillary mucinous neoplasm s/p Whipple 07/13/21  and placement of PEG.  He had another admission end of June with weight loss and dislodging of his PEG, delirium. J-tube for feeding and G-tube for continuous suction.  Albumin was 2.1 09/06/21.  Wt was 131 lb on 08/08/21 down from 141 lbs in April.  Most recent here was 125.2 lbs up from a low of 109 lbs per pt.  He's been eating all of his three meals per day and getting tube feedings.  He believes there's a plan for his tube feeding to be discontinued in the next couple of weeks.  He has been doing well working with therapy by his report.  He's now able to bathe and dress though still not able to ambulate.  Using a manual wheelchair for mobility at this time.  He lives in a 2 story home but has a stairlift.    He shares that he had chemo at [facility] for his myeloma and that's been under control after chemo.    History obtained from review of EMR, discussion with primary team, and interview with family, facility staff/caregiver and/or William Barron.   I reviewed available labs, medications, imaging, studies and related documents from the EMR.  Records reviewed and summarized above.   ROS  Review of Systems  Constitutional:  Positive for activity change, appetite change, fatigue and unexpected weight change. Negative for chills and fever.  HENT:  Negative for hearing loss and trouble swallowing.   Eyes:  Negative for visual disturbance.  Respiratory:  Negative for chest tightness and shortness of breath.   Cardiovascular:  Positive for leg swelling. Negative for chest pain and palpitations.       Has been to [facility] who has ordered him specialty compression stockings/socks  Gastrointestinal:  Negative for abdominal pain, constipation, diarrhea, nausea and vomiting.        Had constipation at first but not using oxycodone anymore  Genitourinary:  Negative for dysuria.  Musculoskeletal:  Positive for arthralgias and gait problem.  Skin:  Positive for pallor.  Neurological:  Positive for weakness. Negative for dizziness.  Hematological:  Bruises/bleeds easily.  Psychiatric/Behavioral:  Positive for dysphoric mood. Negative for confusion and sleep disturbance. The patient is not nervous/anxious.     Physical Exam: There were no vitals filed for this visit. There is no height or weight on file to calculate BMI. Wt Readings from Last 500 Encounters:  09/07/21 113 lb 15.7 oz (51.7 kg)  08/31/21 113 lb 15.7 oz (51.7 kg)  08/19/21 125 lb 3.5 oz (56.8 kg)  04/15/21 140 lb (63.5 kg)  04/10/21 143 lb (64.9 kg)  03/05/21 139 lb 15.9 oz (63.5 kg)  02/08/21 140 lb (63.5 kg)  12/08/20 147 lb (66.7 kg)  12/04/20 152 lb (68.9 kg)  10/20/20 149 lb (67.6 kg)  09/12/20 145 lb (65.8 kg)  05/06/20 140 lb (63.5 kg)  02/19/20 147 lb 8 oz (66.9 kg)  09/13/19 151 lb (68.5 kg)  06/28/19 150 lb 12.8 oz (68.4 kg)  04/30/19 154 lb 9.6 oz (70.1 kg)  11/01/18 152 lb (68.9 kg)  10/27/18 146 lb (66.2 kg)  07/17/18 143 lb 4.8 oz (65 kg)  07/15/18 143 lb 4.8 oz (65 kg)  07/12/18 145 lb (65.8 kg)  06/13/18 150 lb (68 kg)  01/03/18 155 lb 8 oz (70.5 kg)  11/08/17 156 lb (70.8 kg)  09/21/17 156 lb 1 oz (70.8 kg)  07/29/17 157 lb (71.2 kg)  01/03/17 158 lb (71.7 kg)  05/26/16 160 lb (72.6 kg)  05/05/16 160 lb 8 oz (72.8 kg)  11/04/15 158 lb (71.7 kg)  06/13/15 157 lb (71.2 kg)  06/10/15 165 lb (74.8 kg)  05/27/15 166 lb 2 oz (75.4 kg)  05/07/15 162 lb (73.5 kg)  04/03/15 162 lb (73.5 kg)  02/27/15 162 lb 6.4 oz (73.7 kg)  10/20/14 156 lb (70.8 kg)  10/15/14 156 lb (70.8 kg)  09/26/14 163 lb (73.9 kg)  07/29/14 167 lb (75.8 kg)  04/30/14 165 lb (74.8 kg)  04/11/14 161 lb (73 kg)  03/30/14 158 lb (71.7 kg)  03/26/14 162 lb (73.5 kg)  01/09/14 163 lb (73.9 kg)   11/20/13 163 lb (73.9 kg)  10/18/13 164 lb 14.4 oz (74.8 kg)  05/17/13 165 lb 14.4 oz (75.3 kg)  05/01/13 158 lb (71.7 kg)  04/17/13 167 lb 9.6 oz (76 kg)  03/26/13 167 lb 9.6 oz (76 kg)  02/14/13 162 lb (73.5 kg)  02/09/13 165 lb 12.8 oz (75.2 kg)  01/22/13 161 lb 9.6 oz (73.3 kg)  08/10/12 163 lb (73.9 kg)  07/28/12 164 lb (74.4 kg)  03/29/11 164 lb (74.4 kg)  09/21/10 162 lb (73.5 kg)  02/27/10 165 lb (74.8 kg)   Physical Exam Vitals reviewed.  Constitutional:      General: He is not in acute distress.    Appearance: He is ill-appearing.  HENT:     Head: Normocephalic and atraumatic.     Right Ear: External ear normal.     Left Ear: External ear normal.     Nose: Nose normal.     Mouth/Throat:     Pharynx: Oropharynx is clear.  Eyes:     Extraocular Movements: Extraocular movements intact.     Conjunctiva/sclera: Conjunctivae normal.     Pupils: Pupils are equal, round, and reactive to light.     Comments: glasses  Cardiovascular:     Rate and Rhythm: Normal rate and regular rhythm.  Pulmonary:     Effort: Pulmonary effort is normal.     Breath sounds: Normal breath sounds.  Abdominal:     General: Bowel sounds are normal. There is no distension.     Palpations: Abdomen is soft.     Tenderness: There is no abdominal tenderness. There is no guarding or rebound.     Comments: PEJ in place w/o erythema, warmth or drainage at tube site (itching and hurting him some though)  Musculoskeletal:        General: Normal range of motion.     Right lower leg: Edema present.     Left lower leg: Edema present.     Comments: Using wheelchair for mobility  Skin:    General: Skin is warm and dry.     Coloration: Skin is pale.  Neurological:     General: No focal deficit present.     Mental Status: He is alert and oriented to person, place, and time.  Psychiatric:  Comments: Flat affect     CURRENT PROBLEM LIST:  Patient Active Problem List   Diagnosis Date Noted    Abnormal CT of the abdomen    Pressure injury of skin 09/05/2021   Goals of care, counseling/discussion 09/03/2021   Postoperative anemia due to acute blood loss 08/31/2021   Reactive thrombocytosis 08/31/2021   Hypokalemia 08/31/2021   Malnutrition of moderate degree 08/19/2021   Debility 08/19/2021   Jejunostomy tube present ([place])    Asymptomatic bacteriuria 08/15/2021   H/O Whipple procedure 08/13/2021   Chronic hiccups 08/13/2021   Candida esophagitis ([place]) 08/13/2021   Diabetes ([place]) 08/13/2021   Anemia 08/13/2021   Hypoalbuminemia 08/13/2021   Frequent PVCs 08/13/2021   Prolonged QT interval 08/13/2021   Facial droop 08/13/2021   Hormone replacement therapy 08/13/2021   Lesion of parotid gland 08/13/2021   Protein-calorie malnutrition, severe ([place]) 08/13/2021   Feeding difficulties 08/12/2021   Feeding tube dysfunction, initial encounter 08/12/2021   Posterior tibial tendon dysfunction, right 04/15/2021   Posterior tibial tendinitis, right leg    Prostate cancer ([place]) 02/11/2020   Orthostatic tremor 10/01/2019   Educated about COVID-19 virus infection 09/12/2019   Dyslipidemia 09/12/2019   Nonrheumatic pulmonary valve insufficiency 09/12/2019   Red blood cell antibody positive, compatible PRBC difficult to obtain 02/15/2018   Diplopia 01/03/2018   Benign essential tremor 01/03/2018   Pain in joint of right ankle 05/17/2017   Foot pain 05/17/2017   Peroneal tendinitis 05/17/2017   Depression with anxiety 01/03/2017   Insomnia 05/05/2016   Coronary artery calcification seen on CAT scan    DOE (dyspnea on exertion)    Fatigue 02/27/2015   Elevated homocysteine 09/26/2014   Multiple myeloma ([place]) 09/26/2014   Acquired pes planus of right foot 07/02/2014   Posterior tibial tendon dysfunction 07/02/2014   Encounter for antineoplastic chemotherapy 01/02/2014   Kahler disease ([place]) 01/02/2014   Avitaminosis D 01/02/2014   Amyloidosis ([place]) 03/26/2013   Enlarged lymph  node 02/14/2013   Pure hypercholesterolemia 03/29/2011   Memory disorder 09/21/2010   Labile hypertension 09/21/2010   PAST MEDICAL HISTORY:  Active Ambulatory Problems    Diagnosis Date Noted   Memory disorder 09/21/2010   Labile hypertension 09/21/2010   Pure hypercholesterolemia 03/29/2011   Enlarged lymph node 02/14/2013   Amyloidosis ([place]) 03/26/2013   Encounter for antineoplastic chemotherapy 01/02/2014   Kahler disease ([place]) 01/02/2014   Avitaminosis D 01/02/2014   Elevated homocysteine 09/26/2014   Multiple myeloma ([place]) 09/26/2014   Fatigue 02/27/2015   Coronary artery calcification seen on CAT scan    DOE (dyspnea on exertion)    Insomnia 05/05/2016   Depression with anxiety 01/03/2017   Diplopia 01/03/2018   Benign essential tremor 01/03/2018   Educated about COVID-19 virus infection 09/12/2019   Dyslipidemia 09/12/2019   Nonrheumatic pulmonary valve insufficiency 09/12/2019   Pain in joint of right ankle 05/17/2017   Acquired pes planus of right foot 07/02/2014   Foot pain 05/17/2017   Prostate cancer ([place]) 02/11/2020   Orthostatic tremor 10/01/2019   Peroneal tendinitis 05/17/2017   Posterior tibial tendon dysfunction 07/02/2014   Red blood cell antibody positive, compatible PRBC difficult to obtain 02/15/2018   Posterior tibial tendinitis, right leg    Posterior tibial tendon dysfunction, right 04/15/2021   Feeding difficulties 08/12/2021   Feeding tube dysfunction, initial encounter 08/12/2021   H/O Whipple procedure 08/13/2021   Chronic hiccups 08/13/2021   Candida esophagitis ([place]) 08/13/2021   Diabetes ([place]) 08/13/2021   Anemia 08/13/2021  Hypoalbuminemia 08/13/2021   Frequent PVCs 08/13/2021   Prolonged QT interval 08/13/2021   Facial droop 08/13/2021   Hormone replacement therapy 08/13/2021   Lesion of parotid gland 08/13/2021   Protein-calorie malnutrition, severe ([place]) 08/13/2021   Asymptomatic bacteriuria 08/15/2021   Malnutrition of  moderate degree 08/19/2021   Debility 08/19/2021   Jejunostomy tube present ([place])    Postoperative anemia due to acute blood loss 08/31/2021   Reactive thrombocytosis 08/31/2021   Hypokalemia 08/31/2021   Goals of care, counseling/discussion 09/03/2021   Pressure injury of skin 09/05/2021   Abnormal CT of the abdomen    Resolved Ambulatory Problems    Diagnosis Date Noted   Emesis 09/03/2021   Vomiting 09/04/2021   Past Medical History:  Diagnosis Date   Diabetes mellitus without complication (HCC)    GERD (gastroesophageal reflux disease)    Hearing loss    History of kidney stones    Hyperlipidemia    Nephrolithiasis    Occasional tremors    Pneumonia    Prostatitis    Skin cancer    SOCIAL HX:  Social History   Tobacco Use   Smoking status: Former    Packs/day: 3.00    Years: 20.00    Total pack years: 60.00    Types: Cigarettes    Quit date: 03/15/1977    Years since quitting: 44.6   Smokeless tobacco: Never   Tobacco comments:    quit 35 years ago  Substance Use Topics   Alcohol use: Yes    Alcohol/week: 3.0 standard drinks of alcohol    Types: 3 Glasses of wine per week    Comment: approx 1 drink/night   FAMILY HX:  Family History  Problem Relation Age of Onset   Pancreatic cancer Father    Prostate cancer Father    Parkinsonism Mother    CAD Brother 2   Cancer Paternal Grandmother    Breast cancer Neg Hx    Colon cancer Neg Hx       ALLERGIES: No Active Allergies    PERTINENT MEDICATIONS:  Outpatient Encounter Medications as of 10/21/2021  Medication Sig   chlorproMAZINE (THORAZINE) 10 MG tablet Take 0.5 tablets (5 mg total) by mouth 3 (three) times daily as needed for hiccoughs.   diphenhydrAMINE (BENADRYL) 25 MG tablet Take 0.5 tablets (12.5 mg total) by mouth every 6 (six) hours as needed (Hiccups).   gabapentin (NEURONTIN) 250 MG/5ML solution Place 2 mLs (100 mg total) into feeding tube every 8 (eight) hours.   insulin aspart (NOVOLOG  FLEXPEN) 100 UNIT/ML FlexPen Inject 0-10 Units into the skin 4 (four) times daily. Per sliding scale: CBG     0-200: 0 units CBG 201-250: 2 units CBG 251-300: 4 units CBG 301-350: 6 units CBG 351-400: 8 units CBG   >   400: 10 units   insulin aspart (NOVOLOG) 100 UNIT/ML injection Inject 0-9 Units into the skin every 4 (four) hours. (Patient not taking: Reported on 09/04/2021)   megestrol (MEGACE) 400 MG/10ML suspension Place 10 mLs (400 mg total) into feeding tube 2 (two) times daily.   melatonin 3 MG TABS tablet Place 2 tablets (6 mg total) into feeding tube at bedtime as needed. (Patient taking differently: Place 6 mg into feeding tube at bedtime as needed (sleep).)   Mouthwashes (MOUTH RINSE) LIQD solution 15 mLs by Mouth Rinse route 2 times daily at 12 noon and 4 pm.   Nutritional Supplements (FEEDING SUPPLEMENT, GLUCERNA 1.5 CAL,) LIQD At 80 cc/hr from 6  pm to 4 am. (Patient taking differently: Place 80 mLs into feeding tube See admin instructions. 80 ml/hr from 6 pm to 4 am.)   Nutritional Supplements (FEEDING SUPPLEMENT, PROSOURCE TF,) liquid Place 45 mLs into feeding tube 2 (two) times daily.   ondansetron (ZOFRAN) 4 MG tablet Place 1 tablet (4 mg total) into feeding tube every 6 (six) hours as needed for nausea.   oxyCODONE (OXY IR/ROXICODONE) 5 MG immediate release tablet Place 1 tablet (5 mg total) into feeding tube every 4 (four) hours as needed for moderate pain.   pantoprazole sodium (PROTONIX) 40 mg Place 40 mg into feeding tube daily. (Patient taking differently: Place 40 mg into feeding tube daily. Pantoprazole DR 40 mg granules)   Testosterone 20.25 MG/ACT (1.62%) GEL Apply 4 Pump topically daily.   Water For Irrigation, Sterile (FREE WATER) SOLN Place 150 mLs into feeding tube every 4 (four) hours.   No facility-administered encounter medications on file as of 10/21/2021.    Thank you for the opportunity to participate in the care of Mr. Kiener.  The palliative care team  will continue to follow. Please call our office at [phone] if we can be of additional assistance.   Hollace Kinnier, DO  COVID-19 PATIENT SCREENING TOOL Asked and negative response unless otherwise noted:  Have you had symptoms of covid, tested positive or been in contact with someone with symptoms/positive test in the past 5-10 days?  NO

## 2021-10-26 ENCOUNTER — Encounter: Payer: Self-pay | Admitting: Internal Medicine

## 2021-10-28 ENCOUNTER — Ambulatory Visit: Payer: Medicare Other | Attending: Orthopedic Surgery

## 2021-10-28 DIAGNOSIS — R262 Difficulty in walking, not elsewhere classified: Secondary | ICD-10-CM | POA: Diagnosis present

## 2021-10-28 DIAGNOSIS — M6281 Muscle weakness (generalized): Secondary | ICD-10-CM | POA: Insufficient documentation

## 2021-10-28 DIAGNOSIS — M21371 Foot drop, right foot: Secondary | ICD-10-CM | POA: Diagnosis not present

## 2021-10-28 NOTE — Therapy (Signed)
OUTPATIENT PHYSICAL THERAPY NEURO EVALUATION   Patient Name: William Barron MRN: [id number] DOB:10/11/1945, 76 y.o., male Today's Date: 10/28/2021   PCP: [name], MD REFERRING PROVIDER: Meridee Score, MD   PT End of Session - 10/28/21 1105     Visit Number 1    Number of Visits 13    Date for PT Re-Evaluation 12/23/21   to allow for scheduling delay   Authorization Type medicare    PT Start Time 1102    PT Stop Time 1144    PT Time Calculation (min) 42 min    Equipment Utilized During Treatment Gait belt    Activity Tolerance Patient tolerated treatment well    Behavior During Therapy WFL for tasks assessed/performed             Past Medical History:  Diagnosis Date   Amyloidosis ([place])    Diabetes mellitus without complication ([place])    GERD (gastroesophageal reflux disease)    Hearing loss    Has hearing aids   History of kidney stones    Hyperlipidemia    Multiple myeloma ([place])    and amylodosis    Nephrolithiasis    Occasional tremors    Pneumonia    Prostate cancer ([place])    Prostatitis    acute and chronic   Skin cancer    Past Surgical History:  Procedure Laterality Date   APPENDECTOMY     CARDIAC CATHETERIZATION N/A 06/13/2015   Procedure: Left Heart Cath and Coronary Angiography;  Surgeon: Jettie Booze, MD;  Location: [facility];  Service: Cardiovascular;  Laterality: N/A;   COLONOSCOPY  04/07/2000   ELBOW SURGERY  03/15/2001   right   ESOPHAGOGASTRODUODENOSCOPY (EGD) WITH PROPOFOL N/A 09/07/2021   Procedure: ESOPHAGOGASTRODUODENOSCOPY (EGD) WITH PROPOFOL;  Surgeon: Irene Shipper, MD;  Location: [facility];  Service: Gastroenterology;  Laterality: N/A;   EXTRACORPOREAL SHOCK WAVE LITHOTRIPSY Left 07/17/2018   Procedure: EXTRACORPOREAL SHOCK WAVE LITHOTRIPSY (ESWL);  Surgeon: [name], MD;  Location: [facility];  Service: Urology;  Laterality: Left;   EXTRACORPOREAL SHOCK WAVE LITHOTRIPSY Right 12/08/2020   Procedure: RIGHT  EXTRACORPOREAL SHOCK WAVE LITHOTRIPSY (ESWL);  Surgeon: Raynelle Bring, MD;  Location: [facility];  Service: Urology;  Laterality: Right;   FOOT ARTHRODESIS Right 04/15/2021   Procedure: RIGHT SUBTALAR AND TALONAVICULAR FUSION;  Surgeon: Newt Minion, MD;  Location: [facility];  Service: Orthopedics;  Laterality: Right;   HERNIA REPAIR     IR MECH REMOV OBSTRUC MAT ANY COLON TUBE W/FLUORO  08/27/2021   IR [place] GASTRO/COLONIC TUBE PERCUT W/FLUORO  09/08/2021   ROTATOR CUFF REPAIR     WHIPPLE PROCEDURE  07/13/2021   Patient Active Problem List   Diagnosis Date Noted   Abnormal CT of the abdomen    Pressure injury of skin 09/05/2021   Goals of care, counseling/discussion 09/03/2021   Postoperative anemia due to acute blood loss 08/31/2021   Reactive thrombocytosis 08/31/2021   Hypokalemia 08/31/2021   Malnutrition of moderate degree 08/19/2021   Debility 08/19/2021   Jejunostomy tube present ([place])    Asymptomatic bacteriuria 08/15/2021   H/O Whipple procedure 08/13/2021   Chronic hiccups 08/13/2021   Candida esophagitis ([place]) 08/13/2021   Diabetes ([place]) 08/13/2021   Anemia 08/13/2021   Hypoalbuminemia 08/13/2021   Frequent PVCs 08/13/2021   Prolonged QT interval 08/13/2021   Facial droop 08/13/2021   Hormone replacement therapy 08/13/2021   Lesion of parotid gland 08/13/2021   Protein-calorie malnutrition, severe ([place]) 08/13/2021  Feeding difficulties 08/12/2021   Feeding tube dysfunction, initial encounter 08/12/2021   Posterior tibial tendon dysfunction, right 04/15/2021   Posterior tibial tendinitis, right leg    Prostate cancer ([place]) 02/11/2020   Orthostatic tremor 10/01/2019   Educated about COVID-19 virus infection 09/12/2019   Dyslipidemia 09/12/2019   Nonrheumatic pulmonary valve insufficiency 09/12/2019   Red blood cell antibody positive, compatible PRBC difficult to obtain 02/15/2018   Diplopia 01/03/2018   Benign essential tremor 01/03/2018   Pain in  joint of right ankle 05/17/2017   Foot pain 05/17/2017   Peroneal tendinitis 05/17/2017   Depression with anxiety 01/03/2017   Insomnia 05/05/2016   Coronary artery calcification seen on CAT scan    DOE (dyspnea on exertion)    Fatigue 02/27/2015   Elevated homocysteine 09/26/2014   Multiple myeloma ([place]) 09/26/2014   Acquired pes planus of right foot 07/02/2014   Posterior tibial tendon dysfunction 07/02/2014   Encounter for antineoplastic chemotherapy 01/02/2014   Kahler disease ([place]) 01/02/2014   Avitaminosis D 01/02/2014   Amyloidosis ([place]) 03/26/2013   Enlarged lymph node 02/14/2013   Pure hypercholesterolemia 03/29/2011   Memory disorder 09/21/2010   Labile hypertension 09/21/2010    ONSET DATE: 10/19/2021  REFERRING DIAG: M21.371 (ICD-10-CM) - Foot drop, right  THERAPY DIAG:  Difficulty in walking, not elsewhere classified  Muscle weakness (generalized)  Rationale for Evaluation and Treatment Rehabilitation  SUBJECTIVE:                                                                                                                                                                                              SUBJECTIVE STATEMENT: Patient reports doing well. States he hasn't been able to walk for ~4 months since prolonged hospitalization. Currently resides in a SNF, but is not receiving PT there. Had last TF on Sunday, plan to remove PEG in the next few days, per patient.  Pt accompanied by: self  PERTINENT HISTORY: H/O Whipple procedure   Chronic hiccups   Multiple myeloma ([place])   Amyloidosis ([place])   Diabetes ([place])   Depression with anxiety   Prostate cancer ([place])   Goals of care, counseling/discussion   Benign essential tremor   Dyslipidemia  PAIN:  Are you having pain? No  PRECAUTIONS: Fall and Other: PEG   WEIGHT BEARING RESTRICTIONS No  FALLS: Has patient fallen in last 6 months? No  LIVING ENVIRONMENT: Lives with: lives in a nursing home and wife  when he leaves SNF Lives in: House/apartment Stairs: Yes: Internal: flight steps; chairlift and External: 2-3 steps; on right going up Has following equipment at home: Gilford Rile - 2 wheeled,  Wheelchair (manual), shower chair, and Grab bars  PLOF: Requires assistive device for independence, Needs assistance with ADLs, Needs assistance with homemaking, Needs assistance with gait, and Needs assistance with transfers  PATIENT GOALS "to get walking"  OBJECTIVE:   COGNITION: Overall cognitive status: Within functional limits for tasks assessed   SENSATION: Reports N/T in R foot   COORDINATION: WFL B LE   EDEMA:  R LE: 2+ edema L LE: 3+ edema   POSTURE: rounded shoulders, forward head, increased thoracic kyphosis, posterior pelvic tilt, and flexed trunk     LOWER EXTREMITY MMT:    MMT Right Eval Left Eval  Hip flexion 4+ 4+  Hip extension    Hip abduction 4+ 4+  Hip adduction 4+ 4+  Hip internal rotation    Hip external rotation    Knee flexion 4+ 4+  Knee extension 4+ 4+  Ankle dorsiflexion 2- 4+  Ankle plantarflexion 4+ 4+  Ankle inversion    Ankle eversion    (Blank rows = not tested)  BED MOBILITY:  Patient reports no difficulty   TRANSFERS: Assistive device utilized: Environmental consultant - 2 wheeled  Sit to stand: SBA Stand to sit: SBA Chair to chair: SBA   STAIRS:  Level of Assistance: CGA  Stair Negotiation Technique: Step to Pattern with Single Rail on Right  Number of Stairs: 4   Height of Stairs: 6    GAIT: Gait pattern: step through pattern, decreased stride length, decreased ankle dorsiflexion- Right, knee flexed in stance- Right, knee flexed in stance- Left, trunk flexed, narrow BOS, and poor foot clearance- Right Distance walked: clinic Assistive device utilized: Walker - 2 wheeled Level of assistance: CGA   FUNCTIONAL TESTs:   [facility] PT Assessment - 10/28/21 0001       Standardized Balance Assessment   Standardized Balance Assessment Timed Up and Go  Test    10 Meter Walk .83ms      Timed Up and Go Test   Normal TUG (seconds) 45.04             TODAY'S TREATMENT:  N/A eval    PATIENT EDUCATION: Education details: PT POC, OM results, use of an AFO Person educated: Patient Education method: Explanation Education comprehension: verbalized understanding   HOME EXERCISE PROGRAM: To be provided    GOALS: Goals reviewed with patient? Yes  SHORT TERM GOALS: Target date: 11/25/2021  Pt will be independent with initial HEP for improved functional strength   Baseline: to be provided Goal status: INITIAL  2.  Pt will improve TUG to </= 30 secs to demonstrated reduced fall risk  Baseline: 45.04s with RW + CGA Goal status: INITIAL  3.  Pt will improve gait speed to >/= .42mto demonstrate improved community ambulation  Baseline: .3433mGoal status: INITIAL  4.  Patient will be able to negotiate at least 4 steps with R HR per home set up and supervision  Baseline: R HR + CGA Goal status: INITIAL    LONG TERM GOALS: Target date: 12/23/2021  Pt will be independent with final HEP for improved functional strength  Baseline: to be provided Goal status: INITIAL  2.  Pt will improve TUG to </= 20 secs to demonstrated reduced fall risk  Baseline: 45.04s with RW + CGA Goal status: INITIAL  3.  Pt will improve gait speed to >/= .99m20mto demonstrate improved community ambulation  Baseline: .31m/30mal status: INITIAL  4.  Patient will negotiate at least 4 steps with HR use per home  set up ModI  Baseline: R HR + CGA Goal status: INITIAL    ASSESSMENT:  CLINICAL IMPRESSION: Patient is a 76 y.o. male who was seen today for physical therapy evaluation and treatment for R foot drop related to recent prolonged hospitalization. Patient completed the Timed Up and Go test (TUG) in 45.04 seconds.  Geriatrics: need for further assessment of fall risk: ? 12 sec; Recurrent falls: > 15 sec; Vestibular Disorders fall risk:  > 15 sec; Parkinson's Disease fall risk: > 16 sec ([email or web address], 2023). 10 Meter Walk Test: Patient instructed to walk 10 meters (32.8 ft) as quickly and as safely as possible at their normal speed x2 and at a fast speed x2. Time measured from 2 meter mark to 8 meter mark to accommodate ramp-up and ramp-down.  Normal speed: .86ms  Cut off scores: <0.4 m/s = household Ambulator, 0.4-0.8 m/s = limited community Ambulator, >0.8 m/s = community Ambulator, >1.2 m/s = crossing a street, <1.0 = increased fall risk MCID 0.05 m/s (small), 0.13 m/s (moderate), 0.06 m/s (significant)  (ANPTA Core Set of Outcome Measures for Adults with Neurologic Conditions, 2018). While he does have some measurable strength in his R anterior tibialis, it is not sufficient to provide patient with enough dorsiflexion during gait cycle. Patient would benefit from skilled PT services due to the above mentioned deficits.   OBJECTIVE IMPAIRMENTS Abnormal gait, decreased activity tolerance, decreased balance, decreased endurance, decreased knowledge of use of DME, decreased mobility, difficulty walking, decreased ROM, decreased strength, impaired sensation, impaired vision/preception, and postural dysfunction.   ACTIVITY LIMITATIONS carrying, lifting, bending, standing, squatting, stairs, transfers, and locomotion level  PARTICIPATION LIMITATIONS: cleaning, laundry, interpersonal relationship, driving, shopping, and community activity  PERSONAL FACTORS Age, Past/current experiences, Time since onset of injury/illness/exacerbation, Transportation, and 3+ comorbidities: multiple myeloma, amyloidosis, CA  are also affecting patient's functional outcome.   REHAB POTENTIAL: Fair extensive medical history and time since onset  CLINICAL DECISION MAKING: Evolving/moderate complexity  EVALUATION COMPLEXITY: Moderate  PLAN: PT FREQUENCY: 2x/week  PT DURATION: 6 weeks  PLANNED INTERVENTIONS: Therapeutic exercises, Therapeutic  activity, Neuromuscular re-education, Balance training, Gait training, Patient/Family education, Self Care, Joint mobilization, Stair training, Vestibular training, Visual/preceptual remediation/compensation, Orthotic/Fit training, DME instructions, Aquatic Therapy, Electrical stimulation, Wheelchair mobility training, Manual lymph drainage, Compression bandaging, Manual therapy, and Re-evaluation  PLAN FOR NEXT SESSION: did he get AFO?, provide HEP, endurance work, ankle strength    JDebbora Dus PT JDebbora Dus PT, DPT, CBIS  10/28/2021, 11:56 AM

## 2021-11-02 ENCOUNTER — Ambulatory Visit: Payer: Medicare Other

## 2021-11-02 DIAGNOSIS — R262 Difficulty in walking, not elsewhere classified: Secondary | ICD-10-CM | POA: Diagnosis not present

## 2021-11-02 DIAGNOSIS — M6281 Muscle weakness (generalized): Secondary | ICD-10-CM

## 2021-11-02 NOTE — Therapy (Signed)
OUTPATIENT PHYSICAL THERAPY TREATMENT NOTE   Patient Name: William Barron MRN: [id number] DOB:December 03, 1945, 76 y.o., male Today's Date: 11/02/2021  PCP: [name], MD REFERRING PROVIDER: Meridee Score, MD  END OF SESSION:   PT End of Session - 11/02/21 1058     Visit Number 2    Number of Visits 13    Date for PT Re-Evaluation 12/23/21    Authorization Type medicare    PT Start Time 1100    PT Stop Time 1145    PT Time Calculation (min) 45 min    Equipment Utilized During Treatment Gait belt    Activity Tolerance Patient tolerated treatment well    Behavior During Therapy WFL for tasks assessed/performed             Past Medical History:  Diagnosis Date   Amyloidosis ([place])    Diabetes mellitus without complication ([place])    GERD (gastroesophageal reflux disease)    Hearing loss    Has hearing aids   History of kidney stones    Hyperlipidemia    Multiple myeloma ([place])    and amylodosis    Nephrolithiasis    Occasional tremors    Pneumonia    Prostate cancer ([place])    Prostatitis    acute and chronic   Skin cancer    Past Surgical History:  Procedure Laterality Date   APPENDECTOMY     CARDIAC CATHETERIZATION N/A 06/13/2015   Procedure: Left Heart Cath and Coronary Angiography;  Surgeon: Jettie Booze, MD;  Location: [facility];  Service: Cardiovascular;  Laterality: N/A;   COLONOSCOPY  04/07/2000   ELBOW SURGERY  03/15/2001   right   ESOPHAGOGASTRODUODENOSCOPY (EGD) WITH PROPOFOL N/A 09/07/2021   Procedure: ESOPHAGOGASTRODUODENOSCOPY (EGD) WITH PROPOFOL;  Surgeon: Irene Shipper, MD;  Location: [facility];  Service: Gastroenterology;  Laterality: N/A;   EXTRACORPOREAL SHOCK WAVE LITHOTRIPSY Left 07/17/2018   Procedure: EXTRACORPOREAL SHOCK WAVE LITHOTRIPSY (ESWL);  Surgeon: [name], MD;  [facility];  Service: Urology;  Laterality: Left;   EXTRACORPOREAL SHOCK WAVE LITHOTRIPSY Right 12/08/2020   Procedure: RIGHT EXTRACORPOREAL  SHOCK WAVE LITHOTRIPSY (ESWL);  Surgeon: Raynelle Bring, MD;  Location[facility];  Service: Urology;  Laterality: Right;   FOOT ARTHRODESIS Right 04/15/2021   Procedure: RIGHT SUBTALAR AND TALONAVICULAR FUSION;  Surgeon: Newt Minion, MD;  Location: [facility];  Service: Orthopedics;  Laterality: Right;   HERNIA REPAIR     IR MECH REMOV OBSTRUC MAT ANY COLON TUBE W/FLUORO  08/27/2021   IR [place] GASTRO/COLONIC TUBE PERCUT W/FLUORO  09/08/2021   ROTATOR CUFF REPAIR     WHIPPLE PROCEDURE  07/13/2021   Patient Active Problem List   Diagnosis Date Noted   Abnormal CT of the abdomen    Pressure injury of skin 09/05/2021   Goals of care, counseling/discussion 09/03/2021   Postoperative anemia due to acute blood loss 08/31/2021   Reactive thrombocytosis 08/31/2021   Hypokalemia 08/31/2021   Malnutrition of moderate degree 08/19/2021   Debility 08/19/2021   Jejunostomy tube present ([place])    Asymptomatic bacteriuria 08/15/2021   H/O Whipple procedure 08/13/2021   Chronic hiccups 08/13/2021   Candida esophagitis ([place]) 08/13/2021   Diabetes ([place]) 08/13/2021   Anemia 08/13/2021   Hypoalbuminemia 08/13/2021   Frequent PVCs 08/13/2021   Prolonged QT interval 08/13/2021   Facial droop 08/13/2021   Hormone replacement therapy 08/13/2021   Lesion of parotid gland 08/13/2021   Protein-calorie malnutrition, severe ([place]) 08/13/2021   Feeding  difficulties 08/12/2021   Feeding tube dysfunction, initial encounter 08/12/2021   Posterior tibial tendon dysfunction, right 04/15/2021   Posterior tibial tendinitis, right leg    Prostate cancer ([place]) 02/11/2020   Orthostatic tremor 10/01/2019   Educated about COVID-19 virus infection 09/12/2019   Dyslipidemia 09/12/2019   Nonrheumatic pulmonary valve insufficiency 09/12/2019   Red blood cell antibody positive, compatible PRBC difficult to obtain 02/15/2018   Diplopia 01/03/2018   Benign essential tremor 01/03/2018   Pain in joint of right  ankle 05/17/2017   Foot pain 05/17/2017   Peroneal tendinitis 05/17/2017   Depression with anxiety 01/03/2017   Insomnia 05/05/2016   Coronary artery calcification seen on CAT scan    DOE (dyspnea on exertion)    Fatigue 02/27/2015   Elevated homocysteine 09/26/2014   Multiple myeloma ([place]) 09/26/2014   Acquired pes planus of right foot 07/02/2014   Posterior tibial tendon dysfunction 07/02/2014   Encounter for antineoplastic chemotherapy 01/02/2014   Kahler disease ([place]) 01/02/2014   Avitaminosis D 01/02/2014   Amyloidosis ([place]) 03/26/2013   Enlarged lymph node 02/14/2013   Pure hypercholesterolemia 03/29/2011   Memory disorder 09/21/2010   Labile hypertension 09/21/2010    REFERRING DIAG: M21.371 (ICD-10-CM) - Foot drop, right  THERAPY DIAG:  Difficulty in walking, not elsewhere classified  Muscle weakness (generalized)  Rationale for Evaluation and Treatment Rehabilitation  PERTINENT HISTORY: H/O Whipple procedure   Chronic hiccups   Multiple myeloma ([facility])   Amyloidosis ([place])   Diabetes ([place])   Depression with anxiety   Prostate cancer ([place])   Goals of care, counseling/discussion   Benign essential tremor   Dyslipidemia  PRECAUTIONS: fall; PEG  SUBJECTIVE: Patient reports doing well. States that he will be receiving his AFO tomorrow. PEG to be pulled Friday and d/c form SNF on Friday.   PAIN:  Are you having pain? No   TODAY'S TREATMENT:  Edema measurements:  R LE: 6" subpatella: 12.25"; 11" subpatella: 8"  L LE: 6" subpatella: 13"; 11" subpatella: 9.5"   Therex:  -Scifit hills level 1 B UE/LE x10 mins -HEP - see below     PATIENT EDUCATION: Education details: HEP, muscle pump in BLE to assist with edema management  Person educated: Patient Education method: Explanation Education comprehension: verbalized understanding     HOME EXERCISE PROGRAM: Access Code: [email or web address] URL: [email or web address] Date: 11/02/2021 Prepared by:  Estevan Ryder  Exercises - Sit to Stand with Counter Support  - 1 x daily - 7 x weekly - 3 sets - 10 reps - Long Sitting Ankle Eversion with Resistance  - 1 x daily - 7 x weekly - 3 sets - 10 reps - Long Sitting Ankle Plantar Flexion with Resistance  - 1 x daily - 7 x weekly - 3 sets - 10 reps - Long Sitting Ankle Inversion with Resistance  - 1 x daily - 7 x weekly - 3 sets - 10 reps - Long Sitting Ankle Dorsiflexion with Anchored Resistance  - 1 x daily - 7 x weekly - 3 sets - 10 reps - Seated Hip Abduction with Resistance  - 1 x daily - 7 x weekly - 3 sets - 10 reps     GOALS: Goals reviewed with patient? Yes   SHORT TERM GOALS: Target date: 11/25/2021   Pt will be independent with initial HEP for improved functional strength   Baseline: to be provided Goal status: INITIAL   2.  Pt will improve TUG to </= 30 secs to demonstrated reduced fall risk  Baseline: 45.04s with RW + CGA Goal status: INITIAL   3.  Pt will improve gait speed to >/= .48msto demonstrate improved community ambulation   Baseline: .367m Goal status: INITIAL   4.  Patient will be able to negotiate at least 4 steps with R HR per home set up and supervision  Baseline: R HR + CGA Goal status: INITIAL       LONG TERM GOALS: Target date: 12/23/2021   Pt will be independent with final HEP for improved functional strength  Baseline: to be provided Goal status: INITIAL   2.  Pt will improve TUG to </= 20 secs to demonstrated reduced fall risk   Baseline: 45.04s with RW + CGA Goal status: INITIAL   3.  Pt will improve gait speed to >/= .558m to demonstrate improved community ambulation   Baseline: .57m78moal status: INITIAL   4.  Patient will negotiate at least 4 steps with HR use per home set up ModI  Baseline: R HR + CGA Goal status: INITIAL       ASSESSMENT:   CLINICAL IMPRESSION: Patient seen for skilled PT session with emphasis on endurance and HEP establishment. Patient tolerating  Scifit well with no reports of increased fatigue. Modified HEP to not have resistance for dorsiflexion, though he does have muscle activation and minimal AROM. Continue POC.   OBJECTIVE IMPAIRMENTS Abnormal gait, decreased activity tolerance, decreased balance, decreased endurance, decreased knowledge of use of DME, decreased mobility, difficulty walking, decreased ROM, decreased strength, impaired sensation, impaired vision/preception, and postural dysfunction.    ACTIVITY LIMITATIONS carrying, lifting, bending, standing, squatting, stairs, transfers, and locomotion level   PARTICIPATION LIMITATIONS: cleaning, laundry, interpersonal relationship, driving, shopping, and community activity   PERSONAL FACTORS Age, Past/current experiences, Time since onset of injury/illness/exacerbation, Transportation, and 3+ comorbidities: multiple myeloma, amyloidosis, CA  are also affecting patient's functional outcome.    REHAB POTENTIAL: Fair extensive medical history and time since onset   CLINICAL DECISION MAKING: Evolving/moderate complexity   EVALUATION COMPLEXITY: Moderate   PLAN: PT FREQUENCY: 2x/week   PT DURATION: 6 weeks   PLANNED INTERVENTIONS: Therapeutic exercises, Therapeutic activity, Neuromuscular re-education, Balance training, Gait training, Patient/Family education, Self Care, Joint mobilization, Stair training, Vestibular training, Visual/preceptual remediation/compensation, Orthotic/Fit training, DME instructions, Aquatic Therapy, Electrical stimulation, Wheelchair mobility training, Manual lymph drainage, Compression bandaging, Manual therapy, and Re-evaluation   PLAN FOR NEXT SESSION: endurance work, ankle strength, posture     JennDebbora Dus JennDebbora Dus, DPT, CBIS  11/02/2021, 11:53 AM

## 2021-11-05 ENCOUNTER — Ambulatory Visit: Payer: Medicare Other

## 2021-11-05 DIAGNOSIS — R262 Difficulty in walking, not elsewhere classified: Secondary | ICD-10-CM | POA: Diagnosis not present

## 2021-11-05 DIAGNOSIS — M6281 Muscle weakness (generalized): Secondary | ICD-10-CM

## 2021-11-05 NOTE — Therapy (Signed)
OUTPATIENT PHYSICAL THERAPY TREATMENT NOTE   Patient Name: William Barron MRN: [id number] DOB:1945-07-06, 76 y.o., male Today's Date: 11/05/2021  PCP: [name], MD REFERRING PROVIDER: Meridee Score, MD  END OF SESSION:   PT End of Session - 11/05/21 1201     Visit Number 3    Number of Visits 13    Date for PT Re-Evaluation 12/23/21    Authorization Type medicare    PT Start Time 1230    PT Stop Time 5003    PT Time Calculation (min) 47 min    Activity Tolerance Patient tolerated treatment well    Behavior During Therapy WFL for tasks assessed/performed             Past Medical History:  Diagnosis Date   Amyloidosis ([place])    Diabetes mellitus without complication ([place])    GERD (gastroesophageal reflux disease)    Hearing loss    Has hearing aids   History of kidney stones    Hyperlipidemia    Multiple myeloma ([place])    and amylodosis    Nephrolithiasis    Occasional tremors    Pneumonia    Prostate cancer ([place])    Prostatitis    acute and chronic   Skin cancer    Past Surgical History:  Procedure Laterality Date   APPENDECTOMY     CARDIAC CATHETERIZATION N/A 06/13/2015   Procedure: Left Heart Cath and Coronary Angiography;  Surgeon: Jettie Booze, MD;  Location: [facility];  Service: Cardiovascular;  Laterality: N/A;   COLONOSCOPY  04/07/2000   ELBOW SURGERY  03/15/2001   right   ESOPHAGOGASTRODUODENOSCOPY (EGD) WITH PROPOFOL N/A 09/07/2021   Procedure: ESOPHAGOGASTRODUODENOSCOPY (EGD) WITH PROPOFOL;  Surgeon: Irene Shipper, MD;  Location: [facility];  Service: Gastroenterology;  Laterality: N/A;   EXTRACORPOREAL SHOCK WAVE LITHOTRIPSY Left 07/17/2018   Procedure: EXTRACORPOREAL SHOCK WAVE LITHOTRIPSY (ESWL);  Surgeon: [name], MD;  Location: [facility];  Service: Urology;  Laterality: Left;   EXTRACORPOREAL SHOCK WAVE LITHOTRIPSY Right 12/08/2020   Procedure: RIGHT EXTRACORPOREAL SHOCK WAVE LITHOTRIPSY (ESWL);  Surgeon: Raynelle Bring, MD;  Location[facility];  Service: Urology;  Laterality: Right;   FOOT ARTHRODESIS Right 04/15/2021   Procedure: RIGHT SUBTALAR AND TALONAVICULAR FUSION;  Surgeon: Newt Minion, MD;  Location: [facility];  Service: Orthopedics;  Laterality: Right;   HERNIA REPAIR     IR MECH REMOV OBSTRUC MAT ANY COLON TUBE W/FLUORO  08/27/2021   IR [place] GASTRO/COLONIC TUBE PERCUT W/FLUORO  09/08/2021   ROTATOR CUFF REPAIR     WHIPPLE PROCEDURE  07/13/2021   Patient Active Problem List   Diagnosis Date Noted   Abnormal CT of the abdomen    Pressure injury of skin 09/05/2021   Goals of care, counseling/discussion 09/03/2021   Postoperative anemia due to acute blood loss 08/31/2021   Reactive thrombocytosis 08/31/2021   Hypokalemia 08/31/2021   Malnutrition of moderate degree 08/19/2021   Debility 08/19/2021   Jejunostomy tube present ([place])    Asymptomatic bacteriuria 08/15/2021   H/O Whipple procedure 08/13/2021   Chronic hiccups 08/13/2021   Candida esophagitis ([place]) 08/13/2021   Diabetes ([place]) 08/13/2021   Anemia 08/13/2021   Hypoalbuminemia 08/13/2021   Frequent PVCs 08/13/2021   Prolonged QT interval 08/13/2021   Facial droop 08/13/2021   Hormone replacement therapy 08/13/2021   Lesion of parotid gland 08/13/2021   Protein-calorie malnutrition, severe ([place]) 08/13/2021   Feeding difficulties 08/12/2021   Feeding tube dysfunction, initial encounter  08/12/2021   Posterior tibial tendon dysfunction, right 04/15/2021   Posterior tibial tendinitis, right leg    Prostate cancer ([place]) 02/11/2020   Orthostatic tremor 10/01/2019   Educated about COVID-19 virus infection 09/12/2019   Dyslipidemia 09/12/2019   Nonrheumatic pulmonary valve insufficiency 09/12/2019   Red blood cell antibody positive, compatible PRBC difficult to obtain 02/15/2018   Diplopia 01/03/2018   Benign essential tremor 01/03/2018   Pain in joint of right ankle 05/17/2017   Foot pain 05/17/2017    Peroneal tendinitis 05/17/2017   Depression with anxiety 01/03/2017   Insomnia 05/05/2016   Coronary artery calcification seen on CAT scan    DOE (dyspnea on exertion)    Fatigue 02/27/2015   Elevated homocysteine 09/26/2014   Multiple myeloma ([place]) 09/26/2014   Acquired pes planus of right foot 07/02/2014   Posterior tibial tendon dysfunction 07/02/2014   Encounter for antineoplastic chemotherapy 01/02/2014   Kahler disease ([place]) 01/02/2014   Avitaminosis D 01/02/2014   Amyloidosis ([place]) 03/26/2013   Enlarged lymph node 02/14/2013   Pure hypercholesterolemia 03/29/2011   Memory disorder 09/21/2010   Labile hypertension 09/21/2010    REFERRING DIAG: M21.371 (ICD-10-CM) - Foot drop, right  THERAPY DIAG:  Difficulty in walking, not elsewhere classified  Muscle weakness (generalized)  Rationale for Evaluation and Treatment Rehabilitation  PERTINENT HISTORY: H/O Whipple procedure   Chronic hiccups   Multiple myeloma ([facility])   Amyloidosis ([place])   Diabetes ([place])   Depression with anxiety   Prostate cancer ([place])   Goals of care, counseling/discussion   Benign essential tremor   Dyslipidemia  PRECAUTIONS: fall; PEG  SUBJECTIVE: Patient reports doing well. Has AFO, denies skin breakdown due to brace.   PAIN:  Are you having pain? No   TODAY'S TREATMENT:  Gait:  -61' with RW + AFO and supervision (improved foot clearance and initial heel contact)   NMR:  -reviewed ankle exercises per pt request  -ankle proprioceptive exercises on wobble board (added to HEP)  -scifit x10 mins level 2 B UE/LE      PATIENT EDUCATION: Education details: continue HEP, skin care with AFO  Person educated: Patient Education method: Explanation Education comprehension: verbalized understanding     HOME EXERCISE PROGRAM: Access Code: [email or web address] URL: [email or web address] Date: 11/02/2021 Prepared by: Estevan Ryder  Exercises - Sit to Stand with Counter Support  - 1 x  daily - 7 x weekly - 3 sets - 10 reps - Long Sitting Ankle Eversion with Resistance  - 1 x daily - 7 x weekly - 3 sets - 10 reps - Long Sitting Ankle Plantar Flexion with Resistance  - 1 x daily - 7 x weekly - 3 sets - 10 reps - Long Sitting Ankle Inversion with Resistance  - 1 x daily - 7 x weekly - 3 sets - 10 reps - Long Sitting Ankle Dorsiflexion with Anchored Resistance  - 1 x daily - 7 x weekly - 3 sets - 10 reps - Seated Hip Abduction with Resistance  - 1 x daily - 7 x weekly - 3 sets - 10 reps - Ankle Circles on Wobble Board in Sitting  - 1 x daily - 7 x weekly - 3 sets - 10 reps     GOALS: Goals reviewed with patient? Yes   SHORT TERM GOALS: Target date: 11/25/2021   Pt will be independent with initial HEP for improved functional strength   Baseline: to be provided Goal status: INITIAL   2.  Pt will improve TUG to </=  30 secs to demonstrated reduced fall risk   Baseline: 45.04s with RW + CGA Goal status: INITIAL   3.  Pt will improve gait speed to >/= .37msto demonstrate improved community ambulation   Baseline: .348m Goal status: INITIAL   4.  Patient will be able to negotiate at least 4 steps with R HR per home set up and supervision  Baseline: R HR + CGA Goal status: INITIAL       LONG TERM GOALS: Target date: 12/23/2021   Pt will be independent with final HEP for improved functional strength  Baseline: to be provided Goal status: INITIAL   2.  Pt will improve TUG to </= 20 secs to demonstrated reduced fall risk   Baseline: 45.04s with RW + CGA Goal status: INITIAL   3.  Pt will improve gait speed to >/= .5586m to demonstrate improved community ambulation   Baseline: .27m42moal status: INITIAL   4.  Patient will negotiate at least 4 steps with HR use per home set up ModI  Baseline: R HR + CGA Goal status: INITIAL       ASSESSMENT:   CLINICAL IMPRESSION: Patient seen for skilled PT session with emphasis on gait training with AFO and B LE NMR.  Patient with much improved gait mechanics using AFO- improved foot clearance and heel contact. Could benefit from improved stability in stance from lateral hip stabilizers. Tolerating HEP progressions well with noted slight improvement in ant tib activation. Continue POC.   OBJECTIVE IMPAIRMENTS Abnormal gait, decreased activity tolerance, decreased balance, decreased endurance, decreased knowledge of use of DME, decreased mobility, difficulty walking, decreased ROM, decreased strength, impaired sensation, impaired vision/preception, and postural dysfunction.    ACTIVITY LIMITATIONS carrying, lifting, bending, standing, squatting, stairs, transfers, and locomotion level   PARTICIPATION LIMITATIONS: cleaning, laundry, interpersonal relationship, driving, shopping, and community activity   PERSONAL FACTORS Age, Past/current experiences, Time since onset of injury/illness/exacerbation, Transportation, and 3+ comorbidities: multiple myeloma, amyloidosis, CA  are also affecting patient's functional outcome.    REHAB POTENTIAL: Fair extensive medical history and time since onset   CLINICAL DECISION MAKING: Evolving/moderate complexity   EVALUATION COMPLEXITY: Moderate   PLAN: PT FREQUENCY: 2x/week   PT DURATION: 6 weeks   PLANNED INTERVENTIONS: Therapeutic exercises, Therapeutic activity, Neuromuscular re-education, Balance training, Gait training, Patient/Family education, Self Care, Joint mobilization, Stair training, Vestibular training, Visual/preceptual remediation/compensation, Orthotic/Fit training, DME instructions, Aquatic Therapy, Electrical stimulation, Wheelchair mobility training, Manual lymph drainage, Compression bandaging, Manual therapy, and Re-evaluation   PLAN FOR NEXT SESSION: endurance work, ankle strength, posture, ambulatory balance     JennDebbora Dus JennDebbora Dus, DPT, CBIS  11/05/2021, 1:23 PM

## 2021-11-09 ENCOUNTER — Ambulatory Visit: Payer: Medicare Other

## 2021-11-09 DIAGNOSIS — R262 Difficulty in walking, not elsewhere classified: Secondary | ICD-10-CM

## 2021-11-09 DIAGNOSIS — M6281 Muscle weakness (generalized): Secondary | ICD-10-CM

## 2021-11-09 NOTE — Therapy (Signed)
OUTPATIENT PHYSICAL THERAPY TREATMENT NOTE   Patient Name: William Barron MRN: [id number] DOB:08-20-1945, 76 y.o., male Today's Date: 11/09/2021  PCP: [name], MD REFERRING PROVIDER: Meridee Score, MD  END OF SESSION:   PT End of Session - 11/09/21 1101     Visit Number 4    Number of Visits 13    Date for PT Re-Evaluation 12/23/21    Authorization Type medicare    PT Start Time 1101    PT Stop Time 1145    PT Time Calculation (min) 44 min    Activity Tolerance Patient tolerated treatment well    Behavior During Therapy WFL for tasks assessed/performed             Past Medical History:  Diagnosis Date   Amyloidosis ([place])    Diabetes mellitus without complication ([place])    GERD (gastroesophageal reflux disease)    Hearing loss    Has hearing aids   History of kidney stones    Hyperlipidemia    Multiple myeloma ([place])    and amylodosis    Nephrolithiasis    Occasional tremors    Pneumonia    Prostate cancer ([place])    Prostatitis    acute and chronic   Skin cancer    Past Surgical History:  Procedure Laterality Date   APPENDECTOMY     CARDIAC CATHETERIZATION N/A 06/13/2015   Procedure: Left Heart Cath and Coronary Angiography;  Surgeon: Jettie Booze, MD;  Location: [facility];  Service: Cardiovascular;  Laterality: N/A;   COLONOSCOPY  04/07/2000   ELBOW SURGERY  03/15/2001   right   ESOPHAGOGASTRODUODENOSCOPY (EGD) WITH PROPOFOL N/A 09/07/2021   Procedure: ESOPHAGOGASTRODUODENOSCOPY (EGD) WITH PROPOFOL;  Surgeon: Irene Shipper, MD;  Location: [facility];  Service: Gastroenterology;  Laterality: N/A;   EXTRACORPOREAL SHOCK WAVE LITHOTRIPSY Left 07/17/2018   Procedure: EXTRACORPOREAL SHOCK WAVE LITHOTRIPSY (ESWL);  Surgeon: [name], MD;  [facility];  Service: Urology;  Laterality: Left;   EXTRACORPOREAL SHOCK WAVE LITHOTRIPSY Right 12/08/2020   Procedure: RIGHT EXTRACORPOREAL SHOCK WAVE LITHOTRIPSY (ESWL);  Surgeon: Raynelle Bring, MD;  Location: [facility];  Service: Urology;  Laterality: Right;   FOOT ARTHRODESIS Right 04/15/2021   Procedure: RIGHT SUBTALAR AND TALONAVICULAR FUSION;  Surgeon: Newt Minion, MD;  Location: [facility];  Service: Orthopedics;  Laterality: Right;   HERNIA REPAIR     IR MECH REMOV OBSTRUC MAT ANY COLON TUBE W/FLUORO  08/27/2021   IR [place] GASTRO/COLONIC TUBE PERCUT W/FLUORO  09/08/2021   ROTATOR CUFF REPAIR     WHIPPLE PROCEDURE  07/13/2021   Patient Active Problem List   Diagnosis Date Noted   Abnormal CT of the abdomen    Pressure injury of skin 09/05/2021   Goals of care, counseling/discussion 09/03/2021   Postoperative anemia due to acute blood loss 08/31/2021   Reactive thrombocytosis 08/31/2021   Hypokalemia 08/31/2021   Malnutrition of moderate degree 08/19/2021   Debility 08/19/2021   Jejunostomy tube present ([place])    Asymptomatic bacteriuria 08/15/2021   H/O Whipple procedure 08/13/2021   Chronic hiccups 08/13/2021   Candida esophagitis ([place]) 08/13/2021   Diabetes ([place]) 08/13/2021   Anemia 08/13/2021   Hypoalbuminemia 08/13/2021   Frequent PVCs 08/13/2021   Prolonged QT interval 08/13/2021   Facial droop 08/13/2021   Hormone replacement therapy 08/13/2021   Lesion of parotid gland 08/13/2021   Protein-calorie malnutrition, severe ([place]) 08/13/2021   Feeding difficulties 08/12/2021   Feeding tube dysfunction, initial encounter  08/12/2021   Posterior tibial tendon dysfunction, right 04/15/2021   Posterior tibial tendinitis, right leg    Prostate cancer ([place]) 02/11/2020   Orthostatic tremor 10/01/2019   Educated about COVID-19 virus infection 09/12/2019   Dyslipidemia 09/12/2019   Nonrheumatic pulmonary valve insufficiency 09/12/2019   Red blood cell antibody positive, compatible PRBC difficult to obtain 02/15/2018   Diplopia 01/03/2018   Benign essential tremor 01/03/2018   Pain in joint of right ankle 05/17/2017   Foot pain 05/17/2017    Peroneal tendinitis 05/17/2017   Depression with anxiety 01/03/2017   Insomnia 05/05/2016   Coronary artery calcification seen on CAT scan    DOE (dyspnea on exertion)    Fatigue 02/27/2015   Elevated homocysteine 09/26/2014   Multiple myeloma ([place]) 09/26/2014   Acquired pes planus of right foot 07/02/2014   Posterior tibial tendon dysfunction 07/02/2014   Encounter for antineoplastic chemotherapy 01/02/2014   Kahler disease ([place]) 01/02/2014   Avitaminosis D 01/02/2014   Amyloidosis ([place]) 03/26/2013   Enlarged lymph node 02/14/2013   Pure hypercholesterolemia 03/29/2011   Memory disorder 09/21/2010   Labile hypertension 09/21/2010    REFERRING DIAG: M21.371 (ICD-10-CM) - Foot drop, right  THERAPY DIAG:  Difficulty in walking, not elsewhere classified  Muscle weakness (generalized)  Rationale for Evaluation and Treatment Rehabilitation  PERTINENT HISTORY: H/O Whipple procedure   Chronic hiccups   Multiple myeloma ([facility])   Amyloidosis ([place])   Diabetes ([place])   Depression with anxiety   Prostate cancer ([place])   Goals of care, counseling/discussion   Benign essential tremor   Dyslipidemia  PRECAUTIONS: fall; PEG  SUBJECTIVE: Patient reports doing well. Has AFO, denies skin breakdown due to brace. States PEG was pulled Friday without incident and first weekend back at home went well with no falls or near falls.   PAIN:  Are you having pain? No   TODAY'S TREATMENT:  Self care/home management: -discussed home safety given his recent return home after prolonged hospitalization  NMR:  -reviewed ankle exercises per pt request  -ankle proprioceptive exercises on wobble board (added to HEP)  -NMES 2x4' to R ant tib with concurrent AROM DF      PATIENT EDUCATION: Education details: continue HEP, skin care with AFO, home safety  Person educated: Patient Education method: Explanation Education comprehension: verbalized understanding     HOME EXERCISE PROGRAM: Access  Code: [email or web address] URL: [email or web address] Date: 11/02/2021 Prepared by: Estevan Ryder  Exercises - Sit to Stand with Counter Support  - 1 x daily - 7 x weekly - 3 sets - 10 reps - Long Sitting Ankle Eversion with Resistance  - 1 x daily - 7 x weekly - 3 sets - 10 reps - Long Sitting Ankle Plantar Flexion with Resistance  - 1 x daily - 7 x weekly - 3 sets - 10 reps - Long Sitting Ankle Inversion with Resistance  - 1 x daily - 7 x weekly - 3 sets - 10 reps - Long Sitting Ankle Dorsiflexion with Anchored Resistance  - 1 x daily - 7 x weekly - 3 sets - 10 reps - Seated Hip Abduction with Resistance  - 1 x daily - 7 x weekly - 3 sets - 10 reps - Ankle Circles on Wobble Board in Sitting  - 1 x daily - 7 x weekly - 3 sets - 10 reps     GOALS: Goals reviewed with patient? Yes   SHORT TERM GOALS: Target date: 11/25/2021   Pt will be independent with initial HEP  for improved functional strength   Baseline: to be provided Goal status: INITIAL   2.  Pt will improve TUG to </= 30 secs to demonstrated reduced fall risk   Baseline: 45.04s with RW + CGA Goal status: INITIAL   3.  Pt will improve gait speed to >/= .53msto demonstrate improved community ambulation   Baseline: .327m Goal status: INITIAL   4.  Patient will be able to negotiate at least 4 steps with R HR per home set up and supervision  Baseline: R HR + CGA Goal status: INITIAL       LONG TERM GOALS: Target date: 12/23/2021   Pt will be independent with final HEP for improved functional strength  Baseline: to be provided Goal status: INITIAL   2.  Pt will improve TUG to </= 20 secs to demonstrated reduced fall risk   Baseline: 45.04s with RW + CGA Goal status: INITIAL   3.  Pt will improve gait speed to >/= .5568m to demonstrate improved community ambulation   Baseline: .70m3moal status: INITIAL   4.  Patient will negotiate at least 4 steps with HR use per home set up ModI  Baseline: R HR +  CGA Goal status: INITIAL       ASSESSMENT:   CLINICAL IMPRESSION: Patient seen for skilled PT session with emphasis on R ankle NMR and endurance. Patient requesting to review ankle exercises again- noting slight improvement in AROM. PT applying NMES to R anterior tib- no redness noted to skin after completing of this task. Continue POC.   OBJECTIVE IMPAIRMENTS Abnormal gait, decreased activity tolerance, decreased balance, decreased endurance, decreased knowledge of use of DME, decreased mobility, difficulty walking, decreased ROM, decreased strength, impaired sensation, impaired vision/preception, and postural dysfunction.    ACTIVITY LIMITATIONS carrying, lifting, bending, standing, squatting, stairs, transfers, and locomotion level   PARTICIPATION LIMITATIONS: cleaning, laundry, interpersonal relationship, driving, shopping, and community activity   PERSONAL FACTORS Age, Past/current experiences, Time since onset of injury/illness/exacerbation, Transportation, and 3+ comorbidities: multiple myeloma, amyloidosis, CA  are also affecting patient's functional outcome.    REHAB POTENTIAL: Fair extensive medical history and time since onset   CLINICAL DECISION MAKING: Evolving/moderate complexity   EVALUATION COMPLEXITY: Moderate   PLAN: PT FREQUENCY: 2x/week   PT DURATION: 6 weeks   PLANNED INTERVENTIONS: Therapeutic exercises, Therapeutic activity, Neuromuscular re-education, Balance training, Gait training, Patient/Family education, Self Care, Joint mobilization, Stair training, Vestibular training, Visual/preceptual remediation/compensation, Orthotic/Fit training, DME instructions, Aquatic Therapy, Electrical stimulation, Wheelchair mobility training, Manual lymph drainage, Compression bandaging, Manual therapy, and Re-evaluation   PLAN FOR NEXT SESSION: endurance work, ankle strength, posture, ambulatory balance, NMES to R ant tib     JennDebbora Dus JennDebbora Dus,  DPT, CBIS  11/09/2021, 11:49 AM

## 2021-11-10 ENCOUNTER — Telehealth: Payer: Self-pay | Admitting: Internal Medicine

## 2021-11-10 NOTE — Telephone Encounter (Signed)
Good Morning Dr Hilarie Fredrickson,  We have received a request from patient wanting to transfer from [facility] to reestablish care for his general gastro needs. Patient wanted me to mention that you are a neighbor of his.  Records are available for review in [facility]. Please review and advise on scheduling.  Thank you

## 2021-11-11 NOTE — Telephone Encounter (Signed)
Please let patient know he is established with Dr. Carlean Purl in our practice. I will defer to him, but could see the patient if Dr. Carlean Purl would prefer [facility]

## 2021-11-12 ENCOUNTER — Other Ambulatory Visit: Payer: Self-pay | Admitting: Endocrinology

## 2021-11-12 ENCOUNTER — Ambulatory Visit: Payer: Medicare Other

## 2021-11-12 DIAGNOSIS — M6281 Muscle weakness (generalized): Secondary | ICD-10-CM

## 2021-11-12 DIAGNOSIS — R262 Difficulty in walking, not elsewhere classified: Secondary | ICD-10-CM | POA: Diagnosis not present

## 2021-11-12 DIAGNOSIS — E1165 Type 2 diabetes mellitus with hyperglycemia: Secondary | ICD-10-CM

## 2021-11-12 NOTE — Therapy (Signed)
OUTPATIENT PHYSICAL THERAPY TREATMENT NOTE   Patient Name: William Barron MRN: [id number] DOB:[birth year], 77 y.o., male Today's Date: 11/12/2021  PCP: [name], MD REFERRING PROVIDER: Meridee Score, MD  END OF SESSION:   PT End of Session - 11/12/21 1438     Visit Number 5    Number of Visits 13    Date for PT Re-Evaluation 12/23/21    Authorization Type medicare    PT Start Time 1445    PT Stop Time 1527    PT Time Calculation (min) 42 min    Activity Tolerance Patient tolerated treatment well    Behavior During Therapy WFL for tasks assessed/performed             Past Medical History:  Diagnosis Date   Amyloidosis ([place])    Diabetes mellitus without complication ([place])    GERD (gastroesophageal reflux disease)    Hearing loss    Has hearing aids   History of kidney stones    Hyperlipidemia    Multiple myeloma ([place])    and amylodosis    Nephrolithiasis    Occasional tremors    Pneumonia    Prostate cancer ([place])    Prostatitis    acute and chronic   Skin cancer    Past Surgical History:  Procedure Laterality Date   APPENDECTOMY     CARDIAC CATHETERIZATION N/A 06/13/2015   Procedure: Left Heart Cath and Coronary Angiography;  Surgeon: Jettie Booze, MD;  Location: [facility];  Service: Cardiovascular;  Laterality: N/A;   COLONOSCOPY  04/07/2000   ELBOW SURGERY  03/15/2001   right   ESOPHAGOGASTRODUODENOSCOPY (EGD) WITH PROPOFOL N/A 09/07/2021   Procedure: ESOPHAGOGASTRODUODENOSCOPY (EGD) WITH PROPOFOL;  Surgeon: Irene Shipper, MD;  Location: [facility];  Service: Gastroenterology;  Laterality: N/A;   EXTRACORPOREAL SHOCK WAVE LITHOTRIPSY Left 07/17/2018   Procedure: EXTRACORPOREAL SHOCK WAVE LITHOTRIPSY (ESWL);  Surgeon: [name], MD;  Location: [facility];  Service: Urology;  Laterality: Left;   EXTRACORPOREAL SHOCK WAVE LITHOTRIPSY Right 12/08/2020   Procedure: RIGHT EXTRACORPOREAL SHOCK WAVE LITHOTRIPSY (ESWL);  Surgeon: Raynelle Bring, MD;  Location: [facility];  Service: Urology;  Laterality: Right;   FOOT ARTHRODESIS Right 04/15/2021   Procedure: RIGHT SUBTALAR AND TALONAVICULAR FUSION;  Surgeon: Newt Minion, MD;  Location: [facility];  Service: Orthopedics;  Laterality: Right;   HERNIA REPAIR     IR MECH REMOV OBSTRUC MAT ANY COLON TUBE W/FLUORO  08/27/2021   IR [place] GASTRO/COLONIC TUBE PERCUT W/FLUORO  09/08/2021   ROTATOR CUFF REPAIR     WHIPPLE PROCEDURE  07/13/2021   Patient Active Problem List   Diagnosis Date Noted   Abnormal CT of the abdomen    Pressure injury of skin 09/05/2021   Goals of care, counseling/discussion 09/03/2021   Postoperative anemia due to acute blood loss 08/31/2021   Reactive thrombocytosis 08/31/2021   Hypokalemia 08/31/2021   Malnutrition of moderate degree 08/19/2021   Debility 08/19/2021   Jejunostomy tube present ([place])    Asymptomatic bacteriuria 08/15/2021   H/O Whipple procedure 08/13/2021   Chronic hiccups 08/13/2021   Candida esophagitis ([place]) 08/13/2021   Diabetes ([place]) 08/13/2021   Anemia 08/13/2021   Hypoalbuminemia 08/13/2021   Frequent PVCs 08/13/2021   Prolonged QT interval 08/13/2021   Facial droop 08/13/2021   Hormone replacement therapy 08/13/2021   Lesion of parotid gland 08/13/2021   Protein-calorie malnutrition, severe ([place]) 08/13/2021   Feeding difficulties 08/12/2021   Feeding tube dysfunction, initial encounter  08/12/2021   Posterior tibial tendon dysfunction, right 04/15/2021   Posterior tibial tendinitis, right leg    Prostate cancer ([place]) 02/11/2020   Orthostatic tremor 10/01/2019   Educated about COVID-19 virus infection 09/12/2019   Dyslipidemia 09/12/2019   Nonrheumatic pulmonary valve insufficiency 09/12/2019   Red blood cell antibody positive, compatible PRBC difficult to obtain 02/15/2018   Diplopia 01/03/2018   Benign essential tremor 01/03/2018   Pain in joint of right ankle 05/17/2017   Foot pain 05/17/2017    Peroneal tendinitis 05/17/2017   Depression with anxiety 01/03/2017   Insomnia 05/05/2016   Coronary artery calcification seen on CAT scan    DOE (dyspnea on exertion)    Fatigue 02/27/2015   Elevated homocysteine 09/26/2014   Multiple myeloma ([place]) 09/26/2014   Acquired pes planus of right foot 07/02/2014   Posterior tibial tendon dysfunction 07/02/2014   Encounter for antineoplastic chemotherapy 01/02/2014   Kahler disease ([place]) 01/02/2014   Avitaminosis D 01/02/2014   Amyloidosis ([place]) 03/26/2013   Enlarged lymph node 02/14/2013   Pure hypercholesterolemia 03/29/2011   Memory disorder 09/21/2010   Labile hypertension 09/21/2010    REFERRING DIAG: M21.371 (ICD-10-CM) - Foot drop, right  THERAPY DIAG:  Muscle weakness (generalized)  Difficulty in walking, not elsewhere classified  Rationale for Evaluation and Treatment Rehabilitation  PERTINENT HISTORY: H/O Whipple procedure   Chronic hiccups   Multiple myeloma ([place])   Amyloidosis ([place])   Diabetes ([place])   Depression with anxiety   Prostate cancer ([place])   Goals of care, counseling/discussion   Benign essential tremor   Dyslipidemia  PRECAUTIONS: fall; PEG  SUBJECTIVE: Patient reports doing well. No falls since last appt, has had losses of balance in the house walking without the walker.   PAIN:  Are you having pain? No   TODAY'S TREATMENT:  NMR:  -NMES 2x4' to R ant tib with concurrent AROM DF   Gait:  -removed R AFO arch support pad  - 115' x3 RW + supervision, intermittent verbal cues for upright posture and emphasis on R heel first contact (Provided patient with walking program, see below)      PATIENT EDUCATION: Education details: ant tib prognosis, continue HEP Person educated: Patient Education method: Explanation Education comprehension: verbalized understanding     HOME EXERCISE PROGRAM: Access Code: [email or web address] URL: [email or web address] Date: 11/02/2021 Prepared by: Estevan Ryder  Exercises - Sit to Stand with Counter Support  - 1 x daily - 7 x weekly - 3 sets - 10 reps - Long Sitting Ankle Eversion with Resistance  - 1 x daily - 7 x weekly - 3 sets - 10 reps - Long Sitting Ankle Plantar Flexion with Resistance  - 1 x daily - 7 x weekly - 3 sets - 10 reps - Long Sitting Ankle Inversion with Resistance  - 1 x daily - 7 x weekly - 3 sets - 10 reps - Long Sitting Ankle Dorsiflexion with Anchored Resistance  - 1 x daily - 7 x weekly - 3 sets - 10 reps - Seated Hip Abduction with Resistance  - 1 x daily - 7 x weekly - 3 sets - 10 reps - Ankle Circles on Wobble Board in Sitting  - 1 x daily - 7 x weekly - 3 sets - 10 reps   You Can Walk For A Certain Length Of Time Each Day  Walk 10 minutes 2 times per day.             Increase 2  minutes every 2 days              Work up to 45 minutes (1-2 times per day).               Example:                         Day 1-2           4-5 minutes     3 times per day                         Day 7-8           10-12 minutes 2-3 times per day                         Day 13-14       20-22 minutes 1-2 times per day    GOALS: Goals reviewed with patient? Yes   SHORT TERM GOALS: Target date: 11/25/2021   Pt will be independent with initial HEP for improved functional strength   Baseline: to be provided Goal status: INITIAL   2.  Pt will improve TUG to </= 30 secs to demonstrated reduced fall risk   Baseline: 45.04s with RW + CGA Goal status: INITIAL   3.  Pt will improve gait speed to >/= .55m/sto demonstrate improved community ambulation   Baseline: .97m/s Goal status: INITIAL   4.  Patient will be able to negotiate at least 4 steps with R HR per home set up and supervision  Baseline: R HR + CGA Goal status: INITIAL       LONG TERM GOALS: Target date: 12/23/2021   Pt will be independent with final HEP for improved functional strength  Baseline: to be provided Goal status: INITIAL   2.  Pt  will improve TUG to </= 20 secs to demonstrated reduced fall risk   Baseline: 45.04s with RW + CGA Goal status: INITIAL   3.  Pt will improve gait speed to >/= .19m/s  to demonstrate improved community ambulation   Baseline: .76m/s Goal status: INITIAL   4.  Patient will negotiate at least 4 steps with HR use per home set up ModI  Baseline: R HR + CGA Goal status: INITIAL       ASSESSMENT:   CLINICAL IMPRESSION: Patient seen for skilled PT session with emphasis on R ankle NMR and endurance. Patient reports that when he walks, he feels as though his weight his being suddenly shifted "to the right and down," (essentially increased supination). PT removed felt arch support with good results noted and decreased report of the above. Patient tolerating NMES well to anterior tib with slight improvement in AROM, but still not a functional ROM. PT providing patient with walking program to be completed with emphasis on proper gait mechanics. Continue POC.   OBJECTIVE IMPAIRMENTS Abnormal gait, decreased activity tolerance, decreased balance, decreased endurance, decreased knowledge of use of DME, decreased mobility, difficulty walking, decreased ROM, decreased strength, impaired sensation, impaired vision/preception, and postural dysfunction.    ACTIVITY LIMITATIONS carrying, lifting, bending, standing, squatting, stairs, transfers, and locomotion level   PARTICIPATION LIMITATIONS: cleaning, laundry, interpersonal relationship, driving, shopping, and community activity   PERSONAL FACTORS Age, Past/current experiences, Time since onset of injury/illness/exacerbation, Transportation,  and 3+ comorbidities: multiple myeloma, amyloidosis, CA  are also affecting patient's functional outcome.    REHAB POTENTIAL: Fair extensive medical history and time since onset   CLINICAL DECISION MAKING: Evolving/moderate complexity   EVALUATION COMPLEXITY: Moderate   PLAN: PT FREQUENCY: 2x/week   PT  DURATION: 6 weeks   PLANNED INTERVENTIONS: Therapeutic exercises, Therapeutic activity, Neuromuscular re-education, Balance training, Gait training, Patient/Family education, Self Care, Joint mobilization, Stair training, Vestibular training, Visual/preceptual remediation/compensation, Orthotic/Fit training, DME instructions, Aquatic Therapy, Electrical stimulation, Wheelchair mobility training, Manual lymph drainage, Compression bandaging, Manual therapy, and Re-evaluation   PLAN FOR NEXT SESSION: endurance work, ankle strength, posture, ambulatory balance, NMES to R ant tib     Debbora Dus, PT Debbora Dus, PT, DPT, CBIS  11/12/2021, 3:31 PM

## 2021-11-12 NOTE — Telephone Encounter (Signed)
I can see him for follow-up

## 2021-11-13 ENCOUNTER — Other Ambulatory Visit (INDEPENDENT_AMBULATORY_CARE_PROVIDER_SITE_OTHER): Payer: Medicare Other

## 2021-11-13 DIAGNOSIS — E1165 Type 2 diabetes mellitus with hyperglycemia: Secondary | ICD-10-CM

## 2021-11-13 LAB — HEMOGLOBIN A1C: Hgb A1c MFr Bld: 7.4 % — ABNORMAL HIGH (ref 4.6–6.5)

## 2021-11-13 LAB — MICROALBUMIN / CREATININE URINE RATIO
Creatinine,U: 65.6 mg/dL
Microalb Creat Ratio: 5.8 mg/g (ref 0.0–30.0)
Microalb, Ur: 3.8 mg/dL — ABNORMAL HIGH (ref 0.0–1.9)

## 2021-11-13 LAB — GLUCOSE, RANDOM: Glucose, Bld: 288 mg/dL — ABNORMAL HIGH (ref 70–99)

## 2021-11-17 ENCOUNTER — Telehealth: Payer: Self-pay

## 2021-11-17 ENCOUNTER — Ambulatory Visit (INDEPENDENT_AMBULATORY_CARE_PROVIDER_SITE_OTHER): Payer: Medicare Other | Admitting: Orthopedic Surgery

## 2021-11-17 ENCOUNTER — Ambulatory Visit: Payer: Medicare Other | Attending: Orthopedic Surgery | Admitting: Physical Therapy

## 2021-11-17 ENCOUNTER — Ambulatory Visit (INDEPENDENT_AMBULATORY_CARE_PROVIDER_SITE_OTHER): Payer: Medicare Other | Admitting: Endocrinology

## 2021-11-17 ENCOUNTER — Encounter: Payer: Self-pay | Admitting: Physical Therapy

## 2021-11-17 ENCOUNTER — Encounter: Payer: Self-pay | Admitting: Endocrinology

## 2021-11-17 VITALS — BP 122/64 | HR 85 | Ht 65.0 in | Wt 138.2 lb

## 2021-11-17 DIAGNOSIS — R262 Difficulty in walking, not elsewhere classified: Secondary | ICD-10-CM | POA: Diagnosis present

## 2021-11-17 DIAGNOSIS — M6281 Muscle weakness (generalized): Secondary | ICD-10-CM | POA: Insufficient documentation

## 2021-11-17 DIAGNOSIS — E1165 Type 2 diabetes mellitus with hyperglycemia: Secondary | ICD-10-CM

## 2021-11-17 DIAGNOSIS — Z90410 Acquired total absence of pancreas: Secondary | ICD-10-CM

## 2021-11-17 DIAGNOSIS — R931 Abnormal findings on diagnostic imaging of heart and coronary circulation: Secondary | ICD-10-CM

## 2021-11-17 DIAGNOSIS — Z9049 Acquired absence of other specified parts of digestive tract: Secondary | ICD-10-CM | POA: Diagnosis not present

## 2021-11-17 DIAGNOSIS — M21371 Foot drop, right foot: Secondary | ICD-10-CM

## 2021-11-17 DIAGNOSIS — E291 Testicular hypofunction: Secondary | ICD-10-CM

## 2021-11-17 DIAGNOSIS — M76821 Posterior tibial tendinitis, right leg: Secondary | ICD-10-CM | POA: Diagnosis not present

## 2021-11-17 MED ORDER — FREESTYLE LIBRE 2 SENSOR MISC: 2 refills | Status: DC

## 2021-11-17 MED ORDER — TRESIBA FLEXTOUCH 100 UNIT/ML ~~LOC~~ SOPN: 15.0000 [IU] | PEN_INJECTOR | Freq: Every day | SUBCUTANEOUS | 1 refills | Status: DC

## 2021-11-17 NOTE — Therapy (Signed)
OUTPATIENT PHYSICAL THERAPY TREATMENT NOTE   Patient Name: William Barron MRN: [id number] DOB:03/03/1946, 76 y.o., male Today's Date: 11/17/2021  PCP: Scott Holwerda, MD REFERRING PROVIDER: Marcus Duda, MD  END OF SESSION:   PT End of Session - 11/17/21 1457     Visit Number 6    Number of Visits 13    Date for PT Re-Evaluation 12/23/21    Authorization Type medicare    PT Start Time 1454    PT Stop Time 1532    PT Time Calculation (min) 38 min    Activity Tolerance Patient tolerated treatment well    Behavior During Therapy WFL for tasks assessed/performed             Past Medical History:  Diagnosis Date   Amyloidosis ([facility])    Diabetes mellitus without complication ([facility])    GERD (gastroesophageal reflux disease)    Hearing loss    Has hearing aids   History of kidney stones    Hyperlipidemia    Multiple myeloma ([facility])    and amylodosis    Nephrolithiasis    Occasional tremors    Pneumonia    Prostate cancer ([facility])    Prostatitis    acute and chronic   Skin cancer    Past Surgical History:  Procedure Laterality Date   APPENDECTOMY     CARDIAC CATHETERIZATION N/A 06/13/2015   Procedure: Left Heart Cath and Coronary Angiography;  Surgeon: Jayadeep S Varanasi, MD;  Location: [facility];  Service: Cardiovascular;  Laterality: N/A;   COLONOSCOPY  04/07/2000   ELBOW SURGERY  03/15/2001   right   ESOPHAGOGASTRODUODENOSCOPY (EGD) WITH PROPOFOL N/A 09/07/2021   Procedure: ESOPHAGOGASTRODUODENOSCOPY (EGD) WITH PROPOFOL;  Surgeon: Perry, John N, MD;  Location: [facility];  Service: Gastroenterology;  Laterality: N/A;   EXTRACORPOREAL SHOCK WAVE LITHOTRIPSY Left 07/17/2018   Procedure: EXTRACORPOREAL SHOCK WAVE LITHOTRIPSY (ESWL);  Surgeon: Wrenn, John, MD;  [facility];  Service: Urology;  Laterality: Left;   EXTRACORPOREAL SHOCK WAVE LITHOTRIPSY Right 12/08/2020   Procedure: RIGHT EXTRACORPOREAL SHOCK WAVE LITHOTRIPSY (ESWL);  Surgeon: Borden,  Lester, MD;  Location: [facility];  Service: Urology;  Laterality: Right;   FOOT ARTHRODESIS Right 04/15/2021   Procedure: RIGHT SUBTALAR AND TALONAVICULAR FUSION;  Surgeon: Duda, Marcus V, MD;  Location: [facility];  Service: Orthopedics;  Laterality: Right;   HERNIA REPAIR     IR MECH REMOV OBSTRUC MAT ANY COLON TUBE W/FLUORO  08/27/2021   IR [place] GASTRO/COLONIC TUBE PERCUT W/FLUORO  09/08/2021   ROTATOR CUFF REPAIR     WHIPPLE PROCEDURE  07/13/2021   Patient Active Problem List   Diagnosis Date Noted   Abnormal CT of the abdomen    Pressure injury of skin 09/05/2021   Goals of care, counseling/discussion 09/03/2021   Postoperative anemia due to acute blood loss 08/31/2021   Reactive thrombocytosis 08/31/2021   Hypokalemia 08/31/2021   Malnutrition of moderate degree 08/19/2021   Debility 08/19/2021   Jejunostomy tube present ([facility])    Asymptomatic bacteriuria 08/15/2021   H/O Whipple procedure 08/13/2021   Chronic hiccups 08/13/2021   Candida esophagitis ([facility]) 08/13/2021   Diabetes ([facility]) 08/13/2021   Anemia 08/13/2021   Hypoalbuminemia 08/13/2021   Frequent PVCs 08/13/2021   Prolonged QT interval 08/13/2021   Facial droop 08/13/2021   Hormone replacement therapy 08/13/2021   Lesion of parotid gland 08/13/2021   Protein-calorie malnutrition, severe ([facility]) 08/13/2021   Feeding difficulties 08/12/2021   Feeding tube dysfunction, initial encounter   08/12/2021   Posterior tibial tendon dysfunction, right 04/15/2021   Posterior tibial tendinitis, right leg    Prostate cancer ([facility]) 02/11/2020   Orthostatic tremor 10/01/2019   Educated about COVID-19 virus infection 09/12/2019   Dyslipidemia 09/12/2019   Nonrheumatic pulmonary valve insufficiency 09/12/2019   Red blood cell antibody positive, compatible PRBC difficult to obtain 02/15/2018   Diplopia 01/03/2018   Benign essential tremor 01/03/2018   Pain in joint of right ankle 05/17/2017   Foot pain 05/17/2017    Peroneal tendinitis 05/17/2017   Depression with anxiety 01/03/2017   Insomnia 05/05/2016   Coronary artery calcification seen on CAT scan    DOE (dyspnea on exertion)    Fatigue 02/27/2015   Elevated homocysteine 09/26/2014   Multiple myeloma ([facility]) 09/26/2014   Acquired pes planus of right foot 07/02/2014   Posterior tibial tendon dysfunction 07/02/2014   Encounter for antineoplastic chemotherapy 01/02/2014   Kahler disease ([facility]) 01/02/2014   Avitaminosis D 01/02/2014   Amyloidosis ([facility]) 03/26/2013   Enlarged lymph node 02/14/2013   Pure hypercholesterolemia 03/29/2011   Memory disorder 09/21/2010   Labile hypertension 09/21/2010    REFERRING DIAG: M21.371 (ICD-10-CM) - Foot drop, right  THERAPY DIAG:  Muscle weakness (generalized)  Difficulty in walking, not elsewhere classified  Rationale for Evaluation and Treatment Rehabilitation  PERTINENT HISTORY: H/O Whipple procedure   Chronic hiccups   Multiple myeloma ([facility])   Amyloidosis ([facility])   Diabetes ([facility])   Depression with anxiety   Prostate cancer ([facility])   Goals of care, counseling/discussion   Benign essential tremor   Dyslipidemia  PRECAUTIONS: fall; PEG  SUBJECTIVE: Patient cannot tell whether the AFO or removal of arch support is helping.  He denies falls or near misses and states he has primarily been walking with the RW.  He endorses walking frequently throughout the day.  PAIN:  Are you having pain? No   TODAY'S TREATMENT:   GAIT: Gait pattern: decreased stride length, trunk flexed, narrow BOS, poor foot clearance- Right, and poor foot clearance- Left Distance walked: 230' Assistive device utilized: Walker - 2 wheeled and R anterior AFO Level of assistance: SBA Comments: Mod encouragement to finish 2nd lap secondary to onset of fatigue.  Pt responds well to cuing to step into the walker with correction of upright posture, difficulty maintaining throughout remainder of task.  -PT requires minA to doff AFO  prior to activity, needs help removing from shoe due to narrow heel of shoe.  -Active DF in figure 4 seated position to eliminate gravity x20 w/ 1-2sec hold -anteriorly oriented tilt board controlled DF/PF x2mins; cued for intermittent hold into DF stretch -BAPS board controlling multiaxial mvmts into DF/PF x2mins > Inv/Ev x2mins (noted difficulty and decreased ROM with eversion into pronation, encouraged AAROM using knee lever to inc motion into stretch as able) > ankle circles x1min-encouraged inc motion and decreased speed to improve multiaxial ankle mobility  -Toe raises w/ attempts at holding on foam beam w/ toes off front edge of beam 2x5, pt requires inc fwd lean onto counter to attempt holds -heel raises w/ heel dropped off foam beam 2x6, pt uses folded arms on cabinets to maintain trunk upright   PT assists pt in donning AFO and ambulates ~60' to restroom w/ pt at end of session, pt modI w/ RW and ind w/ bathroom management.    PATIENT EDUCATION: Education details: Continue HEP. Person educated: Patient Education method: Explanation Education comprehension: verbalized understanding     HOME EXERCISE PROGRAM: Access Code:   [email or web address] URL: [email or web address] Date: 11/02/2021 Prepared by: Estevan Ryder  Exercises - Sit to Stand with Counter Support  - 1 x daily - 7 x weekly - 3 sets - 10 reps - Long Sitting Ankle Eversion with Resistance  - 1 x daily - 7 x weekly - 3 sets - 10 reps - Long Sitting Ankle Plantar Flexion with Resistance  - 1 x daily - 7 x weekly - 3 sets - 10 reps - Long Sitting Ankle Inversion with Resistance  - 1 x daily - 7 x weekly - 3 sets - 10 reps - Long Sitting Ankle Dorsiflexion with Anchored Resistance  - 1 x daily - 7 x weekly - 3 sets - 10 reps - Seated Hip Abduction with Resistance  - 1 x daily - 7 x weekly - 3 sets - 10 reps - Ankle Circles on Wobble Board in Sitting  - 1 x daily - 7 x weekly - 3 sets - 10 reps   You Can Walk For A  Certain Length Of Time Each Day                          Walk 10 minutes 2 times per day.             Increase 2  minutes every 2 days              Work up to 45 minutes (1-2 times per day).               Example:                         Day 1-2           4-5 minutes     3 times per day                         Day 7-8           10-12 minutes 2-3 times per day                         Day 13-14       20-22 minutes 1-2 times per day    GOALS: Goals reviewed with patient? Yes   SHORT TERM GOALS: Target date: 11/25/2021   Pt will be independent with initial HEP for improved functional strength   Baseline: to be provided Goal status: INITIAL   2.  Pt will improve TUG to </= 30 secs to demonstrated reduced fall risk   Baseline: 45.04s with RW + CGA Goal status: INITIAL   3.  Pt will improve gait speed to >/= .27msto demonstrate improved community ambulation   Baseline: .328m Goal status: INITIAL   4.  Patient will be able to negotiate at least 4 steps with R HR per home set up and supervision  Baseline: R HR + CGA Goal status: INITIAL       LONG TERM GOALS: Target date: 12/23/2021   Pt will be independent with final HEP for improved functional strength  Baseline: to be provided Goal status: INITIAL   2.  Pt will improve TUG to </= 20 secs to demonstrated reduced fall risk   Baseline: 45.04s with RW + CGA Goal status: INITIAL   3.  Pt will improve gait speed to >/= .5556m to demonstrate improved community ambulation   Baseline: .28m23m  Goal status: INITIAL   4.  Patient will negotiate at least 4 steps with HR use per home set up ModI  Baseline: R HR + CGA Goal status: INITIAL       ASSESSMENT:   CLINICAL IMPRESSION:  Session focused on addressing ankle mobility and strength using compliant surface and unstable surfaces to further challenge ROM and motor control.  Pt remains weakest in pronation/eversion motions and DF making coupled motions even more difficulty  to complete, but he is functioning well with AFO and RW.  He remains deconditioned and continues to benefit from ongoing PT POC.  OBJECTIVE IMPAIRMENTS Abnormal gait, decreased activity tolerance, decreased balance, decreased endurance, decreased knowledge of use of DME, decreased mobility, difficulty walking, decreased ROM, decreased strength, impaired sensation, impaired vision/preception, and postural dysfunction.    ACTIVITY LIMITATIONS carrying, lifting, bending, standing, squatting, stairs, transfers, and locomotion level   PARTICIPATION LIMITATIONS: cleaning, laundry, interpersonal relationship, driving, shopping, and community activity   PERSONAL FACTORS Age, Past/current experiences, Time since onset of injury/illness/exacerbation, Transportation, and 3+ comorbidities: multiple myeloma, amyloidosis, CA  are also affecting patient's functional outcome.    REHAB POTENTIAL: Fair extensive medical history and time since onset   CLINICAL DECISION MAKING: Evolving/moderate complexity   EVALUATION COMPLEXITY: Moderate   PLAN: PT FREQUENCY: 2x/week   PT DURATION: 6 weeks   PLANNED INTERVENTIONS: Therapeutic exercises, Therapeutic activity, Neuromuscular re-education, Balance training, Gait training, Patient/Family education, Self Care, Joint mobilization, Stair training, Vestibular training, Visual/preceptual remediation/compensation, Orthotic/Fit training, DME instructions, Aquatic Therapy, Electrical stimulation, Wheelchair mobility training, Manual lymph drainage, Compression bandaging, Manual therapy, and Re-evaluation   PLAN FOR NEXT SESSION: endurance work, ankle strength, posture, ambulatory balance, NMES to R ant tib      B , PT  11/17/2021, 3:38 PM     

## 2021-11-17 NOTE — Progress Notes (Signed)
Patient ID: William Barron, male   DOB: Nov 09, 1945, 76 y.o.   MRN: [id number]           Reason for Appointment: Consultation for Type 2 Diabetes  Referring PCP: [name]   History of Present Illness:          Date of diagnosis of type 2 diabetes mellitus: 2020?       Background history:   He was apparently told when he was getting chemotherapy with steroids at the [facility] [place] [facility] center for his multiple myeloma that he has developed diabetes about 2 to 3 years ago but details are not available However blood sugars are likely as much as 400 and he was taking insulin but mostly correction doses , Outside records appears that highest recent A1c was 9% as of 8/22  Recent history:   Most recent A1c is  7.4  INSULIN regimen is:       Regular insulin 8 units at bedtime, NovoLog 4-8 units before meals based on blood sugars  Non-insulin hypoglycemic drugs the patient is taking are: None  Current management, blood sugar patterns and problems identified: His diabetes has been managed apparently by his PCP and also at times by his specialists at [facility] Mostly has been taking Humalog or NovoLog correction doses at mealtimes but more recently was also given regular insulin 8 units to take at bedtime  Recently with adding the regular insulin in the morning sugars are not consistently very high but he is not only taking correction doses the rest of the day His blood sugars are averaging in the mid to high 200s the rest of the day fairly consistently Previously a couple of months ago because of his weight loss after surgery he was trying to eat without any limitations or restricting any choices including desserts but now he thinks he is starting to cut back on sweets, may occasionally have regular soft drink He was previously given a freestyle libre sensor version 2 but he has not started this and does not know how to use it Currently checking blood sugars about 3-4 times a day on average  with information mostly available for the last week  He does tend to have more thirst and gets up at night 4 times to urinate Recently highest blood sugar was 373 in the afternoon  Usually eating 3 meals a day  Because of his foot drop he is not exercising, using a walker        Glucose monitoring:  done  times a day         Glucometer: One Touch .       Blood Glucose readings by time of day and averages from meter download:  PREMEAL Breakfast Lunch Dinner Bedtime  Overall   Glucose range:  239-262 169-335 221-341 70-373  Median: 130  256  220   Dietician visit, most recent: None   Weight history:  Wt Readings from Last 3 Encounters:  11/17/21 138 lb 3.2 oz (62.7 kg)  10/21/21 125 lb 3.2 oz (56.8 kg)  09/07/21 113 lb 15.7 oz (51.7 kg)    Glycemic control:   Lab Results  Component Value Date   HGBA1C 7.4 (H) 11/13/2021   HGBA1C 7.8 (H) 04/10/2021   Lab Results  Component Value Date   MICROALBUR 3.8 (H) 11/13/2021   LDLCALC 126 (H) 03/29/2011   CREATININE 0.82 09/10/2021   Lab Results  Component Value Date   MICRALBCREAT 5.8 11/13/2021    No results found  for: "FRUCTOSAMINE"  Lab on 11/13/2021  Component Date Value Ref Range Status   Glucose, Bld 11/13/2021 288 (H)  70 - 99 mg/dL Final   Hgb A1c MFr Bld 11/13/2021 7.4 (H)  4.6 - 6.5 % Final   Glycemic Control Guidelines for People with Diabetes:Non Diabetic:  <6%Goal of Therapy: <7%Additional Action Suggested:  >8%    Microalb, Ur 11/13/2021 3.8 (H)  0.0 - 1.9 mg/dL Final   Creatinine,U 11/13/2021 65.6  mg/dL Final   Microalb Creat Ratio 11/13/2021 5.8  0.0 - 30.0 mg/g Final    Allergies as of 11/17/2021   No Active Allergies      Medication List        Accurate as of November 17, 2021 12:51 PM. If you have any questions, ask your nurse or doctor.          STOP taking these medications    insulin regular 100 units/mL injection Commonly known as: NOVOLIN R Stopped by: Elayne Snare, MD        TAKE these medications    chlorproMAZINE 10 MG tablet Commonly known as: THORAZINE Take 0.5 tablets (5 mg total) by mouth 3 (three) times daily as needed for hiccoughs.   Creon 36000 UNITS Cpep capsule Generic drug: lipase/protease/amylase Take by mouth.   diphenhydrAMINE 25 MG tablet Commonly known as: BENADRYL Take 0.5 tablets (12.5 mg total) by mouth every 6 (six) hours as needed (Hiccups).   feeding supplement (PROSource TF) liquid Place 45 mLs into feeding tube 2 (two) times daily. What changed: Another medication with the same name was changed. Make sure you understand how and when to take each.   feeding supplement (GLUCERNA 1.5 CAL) Liqd At 80 cc/hr from 6 pm to 4 am. What changed:  how much to take how to take this when to take this additional instructions   free water Soln Place 150 mLs into feeding tube every 4 (four) hours.   gabapentin 250 MG/5ML solution Commonly known as: NEURONTIN Place 2 mLs (100 mg total) into feeding tube every 8 (eight) hours.   NovoLOG FlexPen 100 UNIT/ML FlexPen Generic drug: insulin aspart Inject 0-10 Units into the skin 4 (four) times daily. Per sliding scale: CBG     0-200: 0 units CBG 201-250: 2 units CBG 251-300: 4 units CBG 301-350: 6 units CBG 351-400: 8 units CBG   >   400: 10 units   insulin aspart 100 UNIT/ML injection Commonly known as: novoLOG Inject 0-9 Units into the skin every 4 (four) hours.   megestrol 400 MG/10ML suspension Commonly known as: MEGACE Place 10 mLs (400 mg total) into feeding tube 2 (two) times daily.   melatonin 3 MG Tabs tablet Place 2 tablets (6 mg total) into feeding tube at bedtime as needed. What changed: reasons to take this   mouth rinse Liqd solution 15 mLs by Mouth Rinse route 2 times daily at 12 noon and 4 pm.   ondansetron 4 MG tablet Commonly known as: ZOFRAN Place 1 tablet (4 mg total) into feeding tube every 6 (six) hours as needed for nausea.   oxyCODONE 5 MG  immediate release tablet Commonly known as: Oxy IR/ROXICODONE Place 1 tablet (5 mg total) into feeding tube every 4 (four) hours as needed for moderate pain.   pantoprazole sodium 40 mg Commonly known as: PROTONIX Place 40 mg into feeding tube daily. What changed: additional instructions   Testosterone 20.25 MG/ACT (1.62%) Gel Apply 4 Pump topically daily.   Tyler Aas FlexTouch 100 UNIT/ML  FlexTouch Pen Generic drug: insulin degludec Inject 15 Units into the skin daily. Started by: Elayne Snare, MD        Allergies: No Active Allergies  Past Medical History:  Diagnosis Date   Amyloidosis ([place])    Diabetes mellitus without complication ([place])    GERD (gastroesophageal reflux disease)    Hearing loss    Has hearing aids   History of kidney stones    Hyperlipidemia    Multiple myeloma ([facility])    and amylodosis    Nephrolithiasis    Occasional tremors    Pneumonia    Prostate cancer ([place])    Prostatitis    acute and chronic   Skin cancer     Past Surgical History:  Procedure Laterality Date   APPENDECTOMY     CARDIAC CATHETERIZATION N/A 06/13/2015   Procedure: Left Heart Cath and Coronary Angiography;  Surgeon: Jettie Booze, MD;  Location: [facility];  Service: Cardiovascular;  Laterality: N/A;   COLONOSCOPY  04/07/2000   ELBOW SURGERY  03/15/2001   right   ESOPHAGOGASTRODUODENOSCOPY (EGD) WITH PROPOFOL N/A 09/07/2021   Procedure: ESOPHAGOGASTRODUODENOSCOPY (EGD) WITH PROPOFOL;  Surgeon: Irene Shipper, MD;  Location: [facility];  Service: Gastroenterology;  Laterality: N/A;   EXTRACORPOREAL SHOCK WAVE LITHOTRIPSY Left 07/17/2018   Procedure: EXTRACORPOREAL SHOCK WAVE LITHOTRIPSY (ESWL);  Surgeon: [name], MD;  Location: [facility];  Service: Urology;  Laterality: Left;   EXTRACORPOREAL SHOCK WAVE LITHOTRIPSY Right 12/08/2020   Procedure: RIGHT EXTRACORPOREAL SHOCK WAVE LITHOTRIPSY (ESWL);  Surgeon: Raynelle Bring, MD;  Location: [facility];   Service: Urology;  Laterality: Right;   FOOT ARTHRODESIS Right 04/15/2021   Procedure: RIGHT SUBTALAR AND TALONAVICULAR FUSION;  Surgeon: Newt Minion, MD;  Location: [facility];  Service: Orthopedics;  Laterality: Right;   HERNIA REPAIR     IR MECH REMOV OBSTRUC MAT ANY COLON TUBE W/FLUORO  08/27/2021   IR REPLC GASTRO/COLONIC TUBE PERCUT W/FLUORO  09/08/2021   ROTATOR CUFF REPAIR     WHIPPLE PROCEDURE  07/13/2021    Family History  Problem Relation Age of Onset   Pancreatic cancer Father    Prostate cancer Father    Parkinsonism Mother    CAD Brother 75   Cancer Paternal Grandmother    Breast cancer Neg Hx    Colon cancer Neg Hx     Social History:  reports that he quit smoking about 44 years ago. His smoking use included cigarettes. He has a 60.00 pack-year smoking history. He has never used smokeless tobacco. He reports current alcohol use of about 3.0 standard drinks of alcohol per week. He reports that he does not use drugs.   Review of Systems  Constitutional:  Positive for weight gain.  Cardiovascular:  Positive for leg swelling. Negative for chest pain.       Left leg swelling  Gastrointestinal:  Negative for diarrhea.  Endocrine: Negative for fatigue.       His wife thinks that he gets fatigued easily  Genitourinary:  Positive for frequency and nocturia.  Musculoskeletal:  Negative for joint pain.  Skin:  Positive for rash.       Has pinkish-red spots on the left leg  Neurological:  Negative for weakness.       Has had foot drop on the right.  No numbness in the feet or tingling     Lipid history: No significant hyperlipidemia, last available LDL is 99 as of 9/22    Lab Results  Component Value Date   CHOL 189 03/29/2011   HDL 45.30 03/29/2011   LDLCALC 126 (H) 03/29/2011   TRIG 129 09/10/2021   CHOLHDL 4 03/29/2011           Hypertension: Has not been present  BP Readings from Last 3 Encounters:  11/17/21 122/64  09/10/21 123/67  08/31/21 110/63     Most recent eye exam was in?  Most recent foot exam: 9/23  Currently known complications of diabetes: Foot drop  He is taking Creon for pancreatic insufficiency  HYPOGONADISM: He has been treated by his urologist for over 10 years with testosterone supplementation, apparently his testosterone level previously was significantly low but he does not know the reason for this, no history of testicular disease or injury No recent labs available  LABS:  Lab on 11/13/2021  Component Date Value Ref Range Status   Glucose, Bld 11/13/2021 288 (H)  70 - 99 mg/dL Final   Hgb A1c MFr Bld 11/13/2021 7.4 (H)  4.6 - 6.5 % Final   Glycemic Control Guidelines for People with Diabetes:Non Diabetic:  <6%Goal of Therapy: <7%Additional Action Suggested:  >8%    Microalb, Ur 11/13/2021 3.8 (H)  0.0 - 1.9 mg/dL Final   Creatinine,U 11/13/2021 65.6  mg/dL Final   Microalb Creat Ratio 11/13/2021 5.8  0.0 - 30.0 mg/g Final    Physical Examination:  BP 122/64   Pulse 85   Ht '5\' 5"'  (1.651 m)   Wt 138 lb 3.2 oz (62.7 kg)   SpO2 93%   BMI 23.00 kg/m   GENERAL:         Averagely built and nourished  HEENT:         Eye exam shows normal external appearance.  Fundus exam and oral exam deferred   NECK:   There is no lymphadenopathy  Thyroid is not enlarged and no nodules felt.   Carotids are normal to palpation and no bruit heard radiating murmur heard on the left  LUNGS:         Chest is symmetrical. Lungs are clear to auscultation.Marland Kitchen   HEART:         Heart sounds:  S1 and S2 are normal.  4 x 6 harsh systolic murmur over the precordium  No S3 or S4.   ABDOMEN:   There is no distention present. Liver and spleen are not palpable.   No other mass or tenderness present.    NEUROLOGICAL:   Ankle jerks are absent bilaterally. Has reduced power of the right foot movements  Diabetic Foot Exam - Simple   Simple Foot Form Diabetic Foot exam was performed with the following findings: Yes   Visual  Inspection See comments: Yes Sensation Testing Intact to touch and monofilament testing bilaterally: Yes Pulse Check See comments: Yes Comments Mild atrophy of the right foot muscles present No skin changes Absent pedal pulses             MUSCULOSKELETAL:  There is no swelling or deformity of the peripheral joints.     EXTREMITIES:     There is no ankle edema.  SKIN:       No rash or lesions of concern.        ASSESSMENT:  Diabetes type 2 on insulin  See history of present illness for detailed discussion of current diabetes management, blood sugar patterns and problems identified  Most recent A1c is 7.4  Currently with his blood sugar monitoring at home is average blood sugar is  220 Hyperglycemia is mostly nonfasting  He likely is insulin deficient because of his partial pancreatectomy and also likely insulin deficient previously by history Currently has not had any diabetes education and no understanding of insulin action or what insulin does as well as the types of insulin Also because of his various other problems he had lost weight which he appears to be getting back  Current treatment regimen is only as needed Premeal analog insulin with 8 units of regular insulin only at bedtime without any basal insulin   Complications of diabetes: Likely has foot drop related to neuropathy  Other active problems: Pancreatic insufficiency secondary to pancreatectomy, history of myeloma and hyperlordosis, left leg edema, foot drop Hypogonadism managed by urologist, no details available Also needs to have lipids assessed as well as cardiac murmur evaluated by cardiology   PLAN:    Glucose monitoring: Start using freestyle libre sensor once he is able to get the supplies Although he may be able to use the [place] 2 will be better if he can get libre 3 for more continuous monitoring and more accuracy and discussed the differences between the 2 and how this would be used  Patient  advised to note blood sugar readings either fasting or 2 hours after meals to help with adjusting his insulin doses as below  Diabetes education: Patient will need considerable amount of diabetes education especially about insulin and detailed information was discussed and subject matter reviewed with him and his wife several times  Lifestyle changes: Dietary changes: Avoid regular soft drinks  Therapy changes: Currently with significant hyperglycemia he will not benefit from other diabetes agents and will focus on getting adequate glycemic control with appropriate basal bolus regimen  Start TRESIBA once a day in the morning with 10 units Discussed in the tail how basal insulin works, timing of injection, dosage, injection sites.   Also discussed titration based on fasting blood sugar every 3 days by 2 units and at target of 90-130 for fasting reading.   Given a flowsheet with instructions on how to keep a record and adjust the doses Insulin sample given  Also discussed in detail the need for mealtime insulin to cover postprandial spikes, action of mealtime insulin, use of the insulin pen, timing and action of the rapid acting insulin as well as starting dose is as follows He will use 6 units with small meals, 8 units for medium sized meals and 10 units for large meals He will take insulin regardless of Premeal blood sugar If the blood sugar 2 hours later is consistently over 180 he will need to add another 10 units He can be using Humalog, NovoLog or lispro insulin since he has a supply of all of them  Stop the regular insulin at bedtime    Follow-up: 6 weeks  Total visit time for evaluation and management of various problems, review of available records and counseling = 70 minutes Total counseling time for educating about diabetes management especially insulin as above = 40 minutes  Patient Instructions   TRESIBA insulin: This basal insulin provides blood sugar control for up to 24  hours with 1 injection  Start with 10 units every morning daily and increase by 2 units every 3 days as shown on the flowsheet until the waking up sugars between about 100-140  Then continue the same dose.   If blood sugar is under 90 for 2 days in a row, reduce the dose by 2 units.  Note that this insulin  does not control the rise of blood sugar with meals     MEALTIME insulin: This will be either Humalog/NovoLog or Lispro insulin This is to be taken at the start of the meal  Start taking 6 units with small meals, 8 units for medium sized meals and 10 units for large meals or eating out Take this regardless of the blood sugar before eating We should try to keep the blood sugars from going over 200 after eating and preferably under 180 using the mealtime insulin       Consultation note has been sent to the referring physician  Elayne Snare 11/17/2021, 12:51 PM   Note: This office note was prepared with Dragon voice recognition system technology. Any transcriptional errors that result from this process are unintentional.

## 2021-11-17 NOTE — Telephone Encounter (Signed)
Spoke with [employer] to request last OV notes from PCP Dr. [name]. They will be faxing over the [place] notes to [phone]. Will forward to provider once received.

## 2021-11-17 NOTE — Patient Instructions (Addendum)
TRESIBA insulin: This basal insulin provides blood sugar control for up to 24 hours with 1 injection  Start with 10 units every morning daily and increase by 2 units every 3 days as shown on the flowsheet until the waking up sugars between about 100-140  Then continue the same dose.   If blood sugar is under 90 for 2 days in a row, reduce the dose by 2 units.  Note that this insulin does not control the rise of blood sugar with meals     MEALTIME insulin: This will be either Humalog/NovoLog or Lispro insulin This is to be taken at the start of the meal  Start taking 6 units with small meals, 8 units for medium sized meals and 10 units for large meals or eating out Take this regardless of the blood sugar before eating We should try to keep the blood sugars from going over 200 after eating and preferably under 180 using the mealtime insulin

## 2021-11-19 ENCOUNTER — Ambulatory Visit: Payer: Medicare Other

## 2021-11-19 ENCOUNTER — Ambulatory Visit
Admission: RE | Admit: 2021-11-19 | Discharge: 2021-11-19 | Disposition: A | Discharge status: Home / Self Care | Payer: Medicare Other | Source: Ambulatory Visit | Attending: Vascular Surgery | Admitting: Vascular Surgery

## 2021-11-19 ENCOUNTER — Other Ambulatory Visit (HOSPITAL_COMMUNITY): Payer: Self-pay | Admitting: Internal Medicine

## 2021-11-19 DIAGNOSIS — R931 Abnormal findings on diagnostic imaging of heart and coronary circulation: Secondary | ICD-10-CM | POA: Diagnosis present

## 2021-11-19 DIAGNOSIS — I371 Nonrheumatic pulmonary valve insufficiency: Secondary | ICD-10-CM | POA: Insufficient documentation

## 2021-11-19 DIAGNOSIS — R609 Edema, unspecified: Secondary | ICD-10-CM | POA: Diagnosis present

## 2021-11-19 DIAGNOSIS — R262 Difficulty in walking, not elsewhere classified: Secondary | ICD-10-CM

## 2021-11-19 DIAGNOSIS — M6281 Muscle weakness (generalized): Secondary | ICD-10-CM

## 2021-11-19 NOTE — Progress Notes (Signed)
Cardiology Office Note   Date:  11/20/2021   ID:  William Barron, DOB 1945-08-09, MRN [id number]  PCP:  [name], MD  Cardiologist:   Minus Breeding, MD   Chief Complaint  Patient presents with   Edema       History of Present Illness: William Barron is a 76 y.o. male who presents for evaluation of coronary calcium.  He had an abnormal POET (Plain Old Exercise Treadmill) and significant calcium in his coronaries.  However, cath demonstrated mild CAD. He had a follow up echo last year.   He had some moderate PR but this was unchanged from previous.  He has no pulmonary HTN and normal RV/LV function.  He returns for follow up. Since I last saw him he had a whipple procedure.  Thankfully this was a benign lesion.  It was a long-haul however.  He denies any new cardiovascular symptoms.  He does have lower extremity swelling and he had Dopplers.  There was no evidence of DVT.  He denies any PND or orthopnea.  He had no palpitations, presyncope or syncope.  He had no chest pressure, neck or arm discomfort.  His weight actually went away down with his Whipple but he has now been eating to get this back up.  She has started exercising with a recumbent bike.  In July 2022 he had an echocardiogram  that demonstrated an EF of 50% which is slightly lower than previous.  He has mild aortic regurgitation.  There is mild mitral regurgitation.  He has moderate pulmonary regurgitation.  This by description has not changed from previous.  He did have an MRI.  This demonstrated an EF of 58%.  There was mild to moderate aortic regurgitation but no stenosis.  There was mild to moderate pulmonary regurgitation.  There was some delayed enhancement and mildly in the basal LV and lateral septal walls.  There was no evidence of myocardial infarction or this was not specifically consistent with amyloid.  There was a small pericardial effusion.   Past Medical History:  Diagnosis Date   Amyloidosis  ([place])    Diabetes mellitus without complication ([place])    GERD (gastroesophageal reflux disease)    Hearing loss    Has hearing aids   History of kidney stones    Hyperlipidemia    Multiple myeloma ([facility])    and amylodosis    Nephrolithiasis    Occasional tremors    Pneumonia    Prostate cancer ([place])    Prostatitis    acute and chronic   Skin cancer     Past Surgical History:  Procedure Laterality Date   APPENDECTOMY     CARDIAC CATHETERIZATION N/A 06/13/2015   Procedure: Left Heart Cath and Coronary Angiography;  Surgeon: Jettie Booze, MD;  Location: [facility];  Service: Cardiovascular;  Laterality: N/A;   COLONOSCOPY  04/07/2000   ELBOW SURGERY  03/15/2001   right   ESOPHAGOGASTRODUODENOSCOPY (EGD) WITH PROPOFOL N/A 09/07/2021   Procedure: ESOPHAGOGASTRODUODENOSCOPY (EGD) WITH PROPOFOL;  Surgeon: Irene Shipper, MD;  Location: [place];  Service: Gastroenterology;  Laterality: N/A;   EXTRACORPOREAL SHOCK WAVE LITHOTRIPSY Left 07/17/2018   Procedure: EXTRACORPOREAL SHOCK WAVE LITHOTRIPSY (ESWL);  Surgeon: [name], MD;  [facility];  Service: Urology;  Laterality: Left;   EXTRACORPOREAL SHOCK WAVE LITHOTRIPSY Right 12/08/2020   Procedure: RIGHT EXTRACORPOREAL SHOCK WAVE LITHOTRIPSY (ESWL);  Surgeon: Raynelle Bring, MD;  Location[facility];  Service: Urology;  Laterality: Right;   FOOT ARTHRODESIS Right 04/15/2021   Procedure: RIGHT SUBTALAR AND TALONAVICULAR FUSION;  Surgeon: Newt Minion, MD;  Location: [place];  Service: Orthopedics;  Laterality: Right;   HERNIA REPAIR     IR MECH REMOV OBSTRUC MAT ANY COLON TUBE W/FLUORO  08/27/2021   IR [place]PLC GASTRO/COLONIC TUBE PERCUT W/FLUORO  09/08/2021   ROTATOR CUFF REPAIR     WHIPPLE PROCEDURE  07/13/2021     Current Outpatient Medications  Medication Sig Dispense Refill   Continuous Blood Gluc Receiver (FREESTYLE LIBRE 2 READER) DEVI Use as instructed to check blood sugars 1 each 0    Continuous Blood Gluc Sensor (FREESTYLE LIBRE 2 SENSOR) MISC Change every 14 days 2 each 2   CREON 36000-114000 units CPEP capsule Take by mouth.     insulin aspart (NOVOLOG FLEXPEN) 100 UNIT/ML FlexPen Inject 0-10 Units into the skin 4 (four) times daily. Per sliding scale: CBG     0-200: 0 units CBG 201-250: 2 units CBG 251-300: 4 units CBG 301-350: 6 units CBG 351-400: 8 units CBG   >   400: 10 units     insulin degludec (TRESIBA FLEXTOUCH) 100 UNIT/ML FlexTouch Pen Inject 15 Units into the skin daily. 15 mL 1   megestrol (MEGACE) 400 MG/10ML suspension Place 10 mLs (400 mg total) into feeding tube 2 (two) times daily. 120 mL 0   melatonin 3 MG TABS tablet Place 2 tablets (6 mg total) into feeding tube at bedtime as needed. (Patient taking differently: Place 6 mg into feeding tube at bedtime as needed (sleep).)  0   pantoprazole sodium (PROTONIX) 40 mg Place 40 mg into feeding tube daily. (Patient taking differently: Place 40 mg into feeding tube daily. Pantoprazole DR 40 mg granules)     Testosterone 20.25 MG/ACT (1.62%) GEL Apply 4 Pump topically daily.     furosemide (LASIX) 20 MG tablet Take 1 tablet (20 mg total) by mouth daily. For 3 days, then as needed. 20 tablet 0   No current facility-administered medications for this visit.    Allergies:   Patient has no active allergies.    ROS:  Please see the history of present illness.   Otherwise, review of systems are positive for none.   All other systems are reviewed and negative.    PHYSICAL EXAM: VS:  BP 130/60 (BP Location: Left Arm, Patient Position: Sitting, Cuff Size: Normal)   Pulse 83   Ht _0  (1.651 m)   Wt 140 lb (63.5 kg)   BMI 23.30 kg/m  , BMI Body mass index is 23.3 kg/m.  GENERAL:  Well appearing NECK:  No jugular venous distention, waveform within normal limits, carotid upstroke brisk and symmetric, no bruits, no thyromegaly LUNGS:  Clear to auscultation bilaterally CHEST:  Unremarkable HEART:  PMI not  displaced or sustained,S1 and S2 within normal limits, no S3, no S4, no clicks, no rubs, 3 out of 6 systolic murmur heard best at the mid right sternal border, no diastolic murmurs ABD:  Flat, positive bowel sounds normal in frequency in pitch, no bruits, no rebound, no guarding, no midline pulsatile mass, no hepatomegaly, no splenomegaly EXT:  2 plus pulses throughout, no edema, no cyanosis no clubbing   EKG:  EKG is not ordered today. [place] Recent Labs: 08/12/2021: TSH 0.399 09/10/2021: ALT 19; BUN 11; Creatinine, Ser 0.82; Hemoglobin 8.3; Magnesium 2.1; Platelets 365; Potassium 3.7; Sodium 138      Wt Readings from Last 3  Encounters:  11/20/21 140 lb (63.5 kg)  11/17/21 138 lb 3.2 oz (62.7 kg)  10/21/21 125 lb 3.2 oz (56.8 kg)      Other studies Reviewed: Additional studies/ records that were reviewed today include:   [facility] Review of the above records demonstrates:   See above   ASSESSMENT AND PLAN:  ELEVATED CORONARY CALCIUM:   He had mild plaque on cath in the past.   He has no new symptoms.  No further testing.   PULMONIC REGURGITATION:      I will follow with an echocardiogram.    MM:    He has been treated with daratumumab/dexamethasone.  There was no evidence of amyloidosis involving his heart.  There is a little less than 10% risk of atrial fibrillation.  He is not currently receiving therapy.    EDEMA:  I will check the echo as above and I will give him Lasix 20 mg po daily x 3 days.  He will call me to let me know if his edema has improved.     The following changes have been made:  As above  Labs/ tests ordered today include:     Orders Placed This Encounter  Procedures   ECHOCARDIOGRAM COMPLETE      Disposition:   FU with me in in 2 months.   Signed, Minus Breeding, MD  11/20/2021 5:25 PM    [facility]

## 2021-11-19 NOTE — Therapy (Signed)
OUTPATIENT PHYSICAL THERAPY TREATMENT NOTE   Patient Name: William Barron MRN: [id number] DOB:[birth year], 76 y.o., male Today's Date: 11/19/2021  PCP: [name], MD REFERRING PROVIDER: Meridee Score, MD  END OF SESSION:   PT End of Session - 11/19/21 1025     Visit Number 7    Number of Visits 13    Date for PT Re-Evaluation 12/23/21    Authorization Type medicare    PT Start Time 1020   patient late   PT Stop Time 1052   unable to participate in full therapy session due to meeting [place] for DVT   PT Time Calculation (min) 32 min    Activity Tolerance Treatment limited secondary to medical complications (Comment)    Behavior During Therapy [facility] for tasks assessed/performed             Past Medical History:  Diagnosis Date   Amyloidosis ([place])    Diabetes mellitus without complication ([place])    GERD (gastroesophageal reflux disease)    Hearing loss    Has hearing aids   History of kidney stones    Hyperlipidemia    Multiple myeloma ([place])    and amylodosis    Nephrolithiasis    Occasional tremors    Pneumonia    Prostate cancer ([place])    Prostatitis    acute and chronic   Skin cancer    Past Surgical History:  Procedure Laterality Date   APPENDECTOMY     CARDIAC CATHETERIZATION N/A 06/13/2015   Procedure: Left Heart Cath and Coronary Angiography;  Surgeon: Jettie Booze, MD;  Location: [place] [facility];  Service: Cardiovascular;  Laterality: N/A;   COLONOSCOPY  04/07/2000   ELBOW SURGERY  03/15/2001   right   ESOPHAGOGASTRODUODENOSCOPY (EGD) WITH PROPOFOL N/A 09/07/2021   Procedure: ESOPHAGOGASTRODUODENOSCOPY (EGD) WITH PROPOFOL;  Surgeon: Irene Shipper, MD;  Location: [place];  Service: Gastroenterology;  Laterality: N/A;   EXTRACORPOREAL SHOCK WAVE LITHOTRIPSY Left 07/17/2018   Procedure: EXTRACORPOREAL SHOCK WAVE LITHOTRIPSY (ESWL);  Surgeon: [name], MD;  [facility];  Service: Urology;  Laterality: Left;   EXTRACORPOREAL  SHOCK WAVE LITHOTRIPSY Right 12/08/2020   Procedure: RIGHT EXTRACORPOREAL SHOCK WAVE LITHOTRIPSY (ESWL);  Surgeon: Raynelle Bring, MD;  [facility];  Service: Urology;  Laterality: Right;   FOOT ARTHRODESIS Right 04/15/2021   Procedure: RIGHT SUBTALAR AND TALONAVICULAR FUSION;  Surgeon: Newt Minion, MD;  Location: [place];  Service: Orthopedics;  Laterality: Right;   HERNIA REPAIR     IR MECH REMOV OBSTRUC MAT ANY COLON TUBE W/FLUORO  08/27/2021   IR [place] GASTRO/COLONIC TUBE PERCUT W/FLUORO  09/08/2021   ROTATOR CUFF REPAIR     WHIPPLE PROCEDURE  07/13/2021   Patient Active Problem List   Diagnosis Date Noted   Abnormal CT of the abdomen    Pressure injury of skin 09/05/2021   Goals of care, counseling/discussion 09/03/2021   Postoperative anemia due to acute blood loss 08/31/2021   Reactive thrombocytosis 08/31/2021   Hypokalemia 08/31/2021   Malnutrition of moderate degree 08/19/2021   Debility 08/19/2021   Jejunostomy tube present ([place])    Asymptomatic bacteriuria 08/15/2021   H/O Whipple procedure 08/13/2021   Chronic hiccups 08/13/2021   Candida esophagitis ([place]) 08/13/2021   Diabetes ([employer]) 08/13/2021   Anemia 08/13/2021   Hypoalbuminemia 08/13/2021   Frequent PVCs 08/13/2021   Prolonged QT interval 08/13/2021   Facial droop 08/13/2021   Hormone replacement therapy 08/13/2021   Lesion of parotid  gland 08/13/2021   Protein-calorie malnutrition, severe ([place]) 08/13/2021   Feeding difficulties 08/12/2021   Feeding tube dysfunction, initial encounter 08/12/2021   Posterior tibial tendon dysfunction, right 04/15/2021   Posterior tibial tendinitis, right leg    Prostate cancer ([place]) 02/11/2020   Orthostatic tremor 10/01/2019   Educated about COVID-19 virus infection 09/12/2019   Dyslipidemia 09/12/2019   Nonrheumatic pulmonary valve insufficiency 09/12/2019   Red blood cell antibody positive, compatible PRBC difficult to obtain 02/15/2018   Diplopia  01/03/2018   Benign essential tremor 01/03/2018   Pain in joint of right ankle 05/17/2017   Foot pain 05/17/2017   Peroneal tendinitis 05/17/2017   Depression with anxiety 01/03/2017   Insomnia 05/05/2016   Coronary artery calcification seen on CAT scan    DOE (dyspnea on exertion)    Fatigue 02/27/2015   Elevated homocysteine 09/26/2014   Multiple myeloma ([place]) 09/26/2014   Acquired pes planus of right foot 07/02/2014   Posterior tibial tendon dysfunction 07/02/2014   Encounter for antineoplastic chemotherapy 01/02/2014   Kahler disease ([place]) 01/02/2014   Avitaminosis D 01/02/2014   Amyloidosis ([place]) 03/26/2013   Enlarged lymph node 02/14/2013   Pure hypercholesterolemia 03/29/2011   Memory disorder 09/21/2010   Labile hypertension 09/21/2010    REFERRING DIAG: M21.371 (ICD-10-CM) - Foot drop, right  THERAPY DIAG:  Muscle weakness (generalized)  Difficulty in walking, not elsewhere classified  Rationale for Evaluation and Treatment Rehabilitation  PERTINENT HISTORY: H/O Whipple procedure   Chronic hiccups   Multiple myeloma ([place])   Amyloidosis ([place])   Diabetes ([place])   Depression with anxiety   Prostate cancer ([place])   Goals of care, counseling/discussion   Benign essential tremor   Dyslipidemia  PRECAUTIONS: fall; PEG  SUBJECTIVE: Patient reports doing well. Denies falls/near falls. Patient ambulating into gym with AFO loose around his ankle. PT adjusting for him. Patient   PAIN:  Are you having pain? No   TODAY'S TREATMENT:  Self care/home management:  -AFO modifications -Well's criteria: 6 (likely for DVT in L LE)  RESULT SUMMARY: 6 points High risk group for DVT. "Likely" according to [facility]' DVT studies.   INPUTS: Active cancer --> 1 = Yes Bedridden recently >3 days or major surgery within 12 weeks --> 1 = Yes Calf swelling >3 cm compared to the other leg --> 1 = Yes Collateral (nonvaricose) superficial veins present --> 0 = No Entire leg swollen  --> 1 = Yes Localized tenderness along the deep venous system --> 1 = Yes Pitting edema, confined to symptomatic leg --> 1 = Yes Paralysis, paresis, or recent plaster immobilization of the lower extremity --> 0 = No Previously documented DVT --> 0 = No Alternative diagnosis to DVT as likely or more likely --> 0 = No  PATIENT EDUCATION: Education details: Continue HEP, well's criteria  Person educated: Patient Education method: Explanation Education comprehension: verbalized understanding     HOME EXERCISE PROGRAM: Access Code: [email or web address] URL: [email or web address] Date: 11/02/2021 Prepared by: Estevan Ryder  Exercises - Sit to Stand with Counter Support  - 1 x daily - 7 x weekly - 3 sets - 10 reps - Long Sitting Ankle Eversion with Resistance  - 1 x daily - 7 x weekly - 3 sets - 10 reps - Long Sitting Ankle Plantar Flexion with Resistance  - 1 x daily - 7 x weekly - 3 sets - 10 reps - Long Sitting Ankle Inversion with Resistance  - 1 x daily - 7 x weekly - 3 sets -  10 reps - Long Sitting Ankle Dorsiflexion with Anchored Resistance  - 1 x daily - 7 x weekly - 3 sets - 10 reps - Seated Hip Abduction with Resistance  - 1 x daily - 7 x weekly - 3 sets - 10 reps - Ankle Circles on Wobble Board in Sitting  - 1 x daily - 7 x weekly - 3 sets - 10 reps   You Can Walk For A Certain Length Of Time Each Day                          Walk 10 minutes 2 times per day.             Increase 2  minutes every 2 days              Work up to 45 minutes (1-2 times per day).               Example:                         Day 1-2           4-5 minutes     3 times per day                         Day 7-8           10-12 minutes 2-3 times per day                         Day 13-14       20-22 minutes 1-2 times per day    GOALS: Goals reviewed with patient? Yes   SHORT TERM GOALS: Target date: 11/25/2021   Pt will be independent with initial HEP for improved functional strength    Baseline: to be provided Goal status: INITIAL   2.  Pt will improve TUG to </= 30 secs to demonstrated reduced fall risk   Baseline: 45.04s with RW + CGA Goal status: INITIAL   3.  Pt will improve gait speed to >/= .23msto demonstrate improved community ambulation   Baseline: .22m Goal status: INITIAL   4.  Patient will be able to negotiate at least 4 steps with R HR per home set up and supervision  Baseline: R HR + CGA Goal status: INITIAL       LONG TERM GOALS: Target date: 12/23/2021   Pt will be independent with final HEP for improved functional strength  Baseline: to be provided Goal status: INITIAL   2.  Pt will improve TUG to </= 20 secs to demonstrated reduced fall risk   Baseline: 45.04s with RW + CGA Goal status: INITIAL   3.  Pt will improve gait speed to >/= .5517m to demonstrate improved community ambulation   Baseline: .22m81moal status: INITIAL   4.  Patient will negotiate at least 4 steps with HR use per home set up ModI  Baseline: R HR + CGA Goal status: INITIAL       ASSESSMENT:   CLINICAL IMPRESSION: Patient seen for skilled PT session with emphasis on adjusting AFO and assessment of Well's Criteria for DVT. Patients L LE is very swollen with 3+ pitting edema and a difference of 7cm in swelling. He does reports L proximal posterior thigh numbness in place of a significant amount of swelling. No visible bruising. No MOI  or reported back pain that could likely contribute to sensory changes. PT deferred at this time. Patient does report having imaging later this afternoon to r/o DVT. PT advising patient to don looser compression sock on L side as his is very tight around calf just distal to knee. Patient verbalized understanding. Continue POC as able.   OBJECTIVE IMPAIRMENTS Abnormal gait, decreased activity tolerance, decreased balance, decreased endurance, decreased knowledge of use of DME, decreased mobility, difficulty walking, decreased ROM,  decreased strength, impaired sensation, impaired vision/preception, and postural dysfunction.    ACTIVITY LIMITATIONS carrying, lifting, bending, standing, squatting, stairs, transfers, and locomotion level   PARTICIPATION LIMITATIONS: cleaning, laundry, interpersonal relationship, driving, shopping, and community activity   PERSONAL FACTORS Age, Past/current experiences, Time since onset of injury/illness/exacerbation, Transportation, and 3+ comorbidities: multiple myeloma, amyloidosis, CA  are also affecting patient's functional outcome.    REHAB POTENTIAL: Fair extensive medical history and time since onset   CLINICAL DECISION MAKING: Evolving/moderate complexity   EVALUATION COMPLEXITY: Moderate   PLAN: PT FREQUENCY: 2x/week   PT DURATION: 6 weeks   PLANNED INTERVENTIONS: Therapeutic exercises, Therapeutic activity, Neuromuscular re-education, Balance training, Gait training, Patient/Family education, Self Care, Joint mobilization, Stair training, Vestibular training, Visual/preceptual remediation/compensation, Orthotic/Fit training, DME instructions, Aquatic Therapy, Electrical stimulation, Wheelchair mobility training, Manual lymph drainage, Compression bandaging, Manual therapy, and Re-evaluation   PLAN FOR NEXT SESSION: endurance work, ankle strength, posture, ambulatory balance, NMES to R ant tib, DVT?     Debbora Dus, PT Debbora Dus, PT, DPT, CBIS   11/19/2021, 10:57 AM

## 2021-11-20 ENCOUNTER — Telehealth: Payer: Self-pay

## 2021-11-20 ENCOUNTER — Encounter: Payer: Self-pay | Admitting: Cardiology

## 2021-11-20 ENCOUNTER — Ambulatory Visit (INDEPENDENT_AMBULATORY_CARE_PROVIDER_SITE_OTHER): Payer: Medicare Other | Admitting: Cardiology

## 2021-11-20 ENCOUNTER — Other Ambulatory Visit: Payer: Self-pay

## 2021-11-20 VITALS — BP 130/60 | HR 83 | Ht 65.0 in | Wt 140.0 lb

## 2021-11-20 DIAGNOSIS — R931 Abnormal findings on diagnostic imaging of heart and coronary circulation: Secondary | ICD-10-CM

## 2021-11-20 DIAGNOSIS — I371 Nonrheumatic pulmonary valve insufficiency: Secondary | ICD-10-CM | POA: Diagnosis not present

## 2021-11-20 DIAGNOSIS — R609 Edema, unspecified: Secondary | ICD-10-CM | POA: Diagnosis not present

## 2021-11-20 DIAGNOSIS — E1165 Type 2 diabetes mellitus with hyperglycemia: Secondary | ICD-10-CM

## 2021-11-20 MED ORDER — FUROSEMIDE 20 MG PO TABS: 20.0000 mg | ORAL_TABLET | Freq: Every day | ORAL | 0 refills | Status: DC

## 2021-11-20 MED ORDER — FREESTYLE LIBRE 2 READER DEVI: 0 refills | Status: DC

## 2021-11-20 NOTE — Telephone Encounter (Signed)
Wife called states that patient had blood sugar this morning of 85 wants to know if she should still give [place] and Novolog when sugar is this low?

## 2021-11-20 NOTE — Patient Instructions (Signed)
Medication Instructions:  Take furosemide (Lasix) 20 mg daily for 3 days, then take as needed.  *If you need a refill on your cardiac medications before your next appointment, please call your pharmacy*  Testing/Procedures: Your physician has requested that you have an echocardiogram. Echocardiography is a painless test that uses sound waves to create images of your heart. It provides your doctor with information about the size and shape of your heart and how well your heart's chambers and valves are working. This procedure takes approximately one hour. There are no restrictions for this procedure.  Follow-Up: At [facility], you and your health needs are our priority.  As part of our continuing mission to provide you with exceptional heart care, we have created designated Provider Care Teams.  These Care Teams include your primary Cardiologist (physician) and Advanced Practice Providers (APPs -  Physician Assistants and Nurse Practitioners) who all work together to provide you with the care you need, when you need it.  We recommend signing up for the patient portal called "MyChart".  Sign up information is provided on this After Visit Summary.  MyChart is used to connect with patients for Virtual Visits (Telemedicine).  Patients are able to view lab/test results, encounter notes, upcoming appointments, etc.  Non-urgent messages can be sent to your provider as well.   To learn more about what you can do with MyChart, go to [email or web address].    Your next appointment:   2-3 month(s)  The format for your next appointment:   In Person  Provider:   Dr. Percival Spanish   Important Information About Sugar

## 2021-11-21 ENCOUNTER — Encounter: Payer: Self-pay | Admitting: Endocrinology

## 2021-11-23 ENCOUNTER — Ambulatory Visit: Payer: Medicare Other

## 2021-11-23 NOTE — Telephone Encounter (Signed)
Patient was scheduled for 11/14 at 10:10 with Dr. Carlean Purl

## 2021-11-24 ENCOUNTER — Encounter: Payer: Self-pay | Admitting: Orthopedic Surgery

## 2021-11-24 NOTE — Progress Notes (Signed)
Office Visit Note   Patient: William Barron           Date of Birth: 1945/05/06           MRN: [id number] Visit Date: 11/17/2021              Requested by: [name], MD [address] [place],  [place] [address] PCP: [name], MD  Chief Complaint  Patient presents with   Right Foot - Follow-up      HPI: Patient is a 76 year old gentleman who is status post right subtalar and talonavicular fusion 7 months ago for posterior tibial tendon insufficiency.  Patient has foot drop on the right and is having pain in the peroneal tendons.  Patient states he has involuntary tremors.  Assessment & Plan: Visit Diagnoses:  1. Foot drop, right   2. Posterior tibial tendinitis, right leg     Plan: Recommended using a custom orthotic with an anterior AFO on the right to see if this will unload the peroneal tendon weakness.    Follow-Up Instructions: Return in about 4 weeks (around 12/15/2021).   Ortho Exam  Patient is alert, oriented, no adenopathy, well-dressed, normal affect, normal respiratory effort. Examination patient has swelling in the left calf there is no tenderness no redness he is wearing a compression sock no signs of DVT or infection.  Swelling seems consistent with venous insufficiency.  Patient does have peroneal tendon weakness on the right.  Imaging: No results found. No images are attached to the encounter.  Labs: Lab Results  Component Value Date   HGBA1C 7.4 (H) 11/13/2021   HGBA1C 7.8 (H) 04/10/2021   REPTSTATUS 09/06/2021 FINAL 09/03/2021   CULT (A) 09/03/2021    LACTOBACILLUS FERMENTUM Standardized susceptibility testing for this organism is not available. Performed at [facility], [address] [place], [place] [address]    LABORGA KLEBSIELLA PNEUMONIAE (A) 08/11/2021   LABORGA CITROBACTER FREUNDII (A) 08/11/2021     Lab Results  Component Value Date   ALBUMIN 2.1 (L) 09/10/2021   ALBUMIN 2.1 (L) 09/09/2021   ALBUMIN 2.1  (L) 09/06/2021    Lab Results  Component Value Date   MG 2.1 09/10/2021   MG 1.9 09/09/2021   MG 1.8 09/08/2021   Lab Results  Component Value Date   VD25OH 24.1 (L) 09/26/2014    No results found for: "PREALBUMIN"    Latest Ref Rng & Units 09/10/2021    4:32 AM 09/09/2021    3:06 AM 09/06/2021    4:02 AM  CBC EXTENDED  WBC 4.0 - 10.5 K/uL 7.0  6.9  6.1   RBC 4.22 - 5.81 MIL/uL 3.01  3.04  3.11   Hemoglobin 13.0 - 17.0 g/dL 8.3  8.1  8.4   HCT 39.0 - 52.0 % 26.2  26.8  26.7   Platelets 150 - 400 K/uL 365  396  390   NEUT# 1.7 - 7.7 K/uL   4.4   Lymph# 0.7 - 4.0 K/uL   0.9      There is no height or weight on file to calculate BMI.  Orders:  No orders of the defined types were placed in this encounter.  No orders of the defined types were placed in this encounter.    Procedures: No procedures performed  Clinical Data: No additional findings.  ROS:  All other systems negative, except as noted in the HPI. Review of Systems  Objective: Vital Signs: There were no vitals taken for this  visit.  Specialty Comments:  No specialty comments available.  PMFS History: Patient Active Problem List   Diagnosis Date Noted   Abnormal CT of the abdomen    Pressure injury of skin 09/05/2021   Goals of care, counseling/discussion 09/03/2021   Postoperative anemia due to acute blood loss 08/31/2021   Reactive thrombocytosis 08/31/2021   Hypokalemia 08/31/2021   Malnutrition of moderate degree 08/19/2021   Debility 08/19/2021   Jejunostomy tube present ([place])    Asymptomatic bacteriuria 08/15/2021   H/O Whipple procedure 08/13/2021   Chronic hiccups 08/13/2021   Candida esophagitis ([place]) 08/13/2021   Diabetes ([place]) 08/13/2021   Anemia 08/13/2021   Hypoalbuminemia 08/13/2021   Frequent PVCs 08/13/2021   Prolonged QT interval 08/13/2021   Facial droop 08/13/2021   Hormone replacement therapy 08/13/2021   Lesion of parotid gland 08/13/2021   Protein-calorie  malnutrition, severe ([place]) 08/13/2021   Feeding difficulties 08/12/2021   Feeding tube dysfunction, initial encounter 08/12/2021   Posterior tibial tendon dysfunction, right 04/15/2021   Posterior tibial tendinitis, right leg    Prostate cancer ([place]) 02/11/2020   Orthostatic tremor 10/01/2019   Educated about COVID-19 virus infection 09/12/2019   Dyslipidemia 09/12/2019   Nonrheumatic pulmonary valve insufficiency 09/12/2019   Red blood cell antibody positive, compatible PRBC difficult to obtain 02/15/2018   Diplopia 01/03/2018   Benign essential tremor 01/03/2018   Pain in joint of right ankle 05/17/2017   Foot pain 05/17/2017   Peroneal tendinitis 05/17/2017   Depression with anxiety 01/03/2017   Insomnia 05/05/2016   Coronary artery calcification seen on CAT scan    DOE (dyspnea on exertion)    Fatigue 02/27/2015   Elevated homocysteine 09/26/2014   Multiple myeloma ([place]) 09/26/2014   Acquired pes planus of right foot 07/02/2014   Posterior tibial tendon dysfunction 07/02/2014   Encounter for antineoplastic chemotherapy 01/02/2014   Kahler disease ([place]) 01/02/2014   Avitaminosis D 01/02/2014   Amyloidosis ([place]) 03/26/2013   Enlarged lymph node 02/14/2013   Pure hypercholesterolemia 03/29/2011   Memory disorder 09/21/2010   Labile hypertension 09/21/2010   Past Medical History:  Diagnosis Date   Amyloidosis ([place])    Diabetes mellitus without complication ([place])    GERD (gastroesophageal reflux disease)    Hearing loss    Has hearing aids   History of kidney stones    Hyperlipidemia    Multiple myeloma ([facility])    and amylodosis    Nephrolithiasis    Occasional tremors    Pneumonia    Prostate cancer ([place])    Prostatitis    acute and chronic   Skin cancer     Family History  Problem Relation Age of Onset   Pancreatic cancer Father    Prostate cancer Father    Parkinsonism Mother    CAD Brother 27   Cancer Paternal Grandmother    Breast cancer Neg Hx    Colon  cancer Neg Hx     Past Surgical History:  Procedure Laterality Date   APPENDECTOMY     CARDIAC CATHETERIZATION N/A 06/13/2015   Procedure: Left Heart Cath and Coronary Angiography;  Surgeon: Jettie Booze, MD;  Location: [facility];  Service: Cardiovascular;  Laterality: N/A;   COLONOSCOPY  04/07/2000   ELBOW SURGERY  03/15/2001   right   ESOPHAGOGASTRODUODENOSCOPY (EGD) WITH PROPOFOL N/A 09/07/2021   Procedure: ESOPHAGOGASTRODUODENOSCOPY (EGD) WITH PROPOFOL;  Surgeon: Irene Shipper, MD;  Location: [facility];  Service: Gastroenterology;  Laterality: N/A;   EXTRACORPOREAL SHOCK WAVE  LITHOTRIPSY Left 07/17/2018   Procedure: EXTRACORPOREAL SHOCK WAVE LITHOTRIPSY (ESWL);  Surgeon: [name], MD;  Location: [facility];  Service: Urology;  Laterality: Left;   EXTRACORPOREAL SHOCK WAVE LITHOTRIPSY Right 12/08/2020   Procedure: RIGHT EXTRACORPOREAL SHOCK WAVE LITHOTRIPSY (ESWL);  Surgeon: Raynelle Bring, MD;  Location[facility];  Service: Urology;  Laterality: Right;   FOOT ARTHRODESIS Right 04/15/2021   Procedure: RIGHT SUBTALAR AND TALONAVICULAR FUSION;  Surgeon: Newt Minion, MD;  Location: [facility];  Service: Orthopedics;  Laterality: Right;   HERNIA REPAIR     IR [place] REMOV OBSTRUC MAT ANY COLON TUBE W/FLUORO  08/27/2021   IR [place] GASTRO/COLONIC TUBE PERCUT W/FLUORO  09/08/2021   ROTATOR CUFF REPAIR     WHIPPLE PROCEDURE  07/13/2021   Social History   Occupational History   Occupation: retired  Tobacco Use   Smoking status: Former    Packs/day: 3.00    Years: 20.00    Total pack years: 60.00    Types: Cigarettes    Quit date: 03/15/1977    Years since quitting: 44.7   Smokeless tobacco: Never   Tobacco comments:    quit 35 years ago  Vaping Use   Vaping Use: Never used  Substance and Sexual Activity   Alcohol use: Yes    Alcohol/week: 3.0 standard drinks of alcohol    Types: 3 Glasses of wine per week    Comment: approx 1 drink/night   Drug use:  No   Sexual activity: Not Currently    Birth control/protection: None

## 2021-11-26 ENCOUNTER — Ambulatory Visit: Payer: Medicare Other

## 2021-11-26 DIAGNOSIS — M6281 Muscle weakness (generalized): Secondary | ICD-10-CM

## 2021-11-26 DIAGNOSIS — R262 Difficulty in walking, not elsewhere classified: Secondary | ICD-10-CM

## 2021-11-26 NOTE — Therapy (Signed)
OUTPATIENT PHYSICAL THERAPY TREATMENT NOTE   Patient Name: William Barron MRN: [id number] DOB:1945/12/02, 76 y.o., male Today's Date: 11/26/2021  PCP: [name], MD REFERRING PROVIDER: Meridee Score, MD  END OF SESSION:   PT End of Session - 11/26/21 1538     Visit Number 8    Number of Visits 13    Date for PT Re-Evaluation 12/23/21    Authorization Type medicare    Progress Note Due on Visit 10    PT Start Time 1536   patient late   PT Stop Time 1614    PT Time Calculation (min) 38 min    Activity Tolerance Patient tolerated treatment well    Behavior During Therapy WFL for tasks assessed/performed             Past Medical History:  Diagnosis Date   Amyloidosis ([place])    Diabetes mellitus without complication ([place])    GERD (gastroesophageal reflux disease)    Hearing loss    Has hearing aids   History of kidney stones    Hyperlipidemia    Multiple myeloma ([place])    and amylodosis    Nephrolithiasis    Occasional tremors    Pneumonia    Prostate cancer ([place])    Prostatitis    acute and chronic   Skin cancer    Past Surgical History:  Procedure Laterality Date   APPENDECTOMY     CARDIAC CATHETERIZATION N/A 06/13/2015   Procedure: Left Heart Cath and Coronary Angiography;  Surgeon: Jettie Booze, MD;  Location: [facility];  Service: Cardiovascular;  Laterality: N/A;   COLONOSCOPY  04/07/2000   ELBOW SURGERY  03/15/2001   right   ESOPHAGOGASTRODUODENOSCOPY (EGD) WITH PROPOFOL N/A 09/07/2021   Procedure: ESOPHAGOGASTRODUODENOSCOPY (EGD) WITH PROPOFOL;  Surgeon: Irene Shipper, MD;  Location: [facility];  Service: Gastroenterology;  Laterality: N/A;   EXTRACORPOREAL SHOCK WAVE LITHOTRIPSY Left 07/17/2018   Procedure: EXTRACORPOREAL SHOCK WAVE LITHOTRIPSY (ESWL);  Surgeon: [name], MD;  [facility];  Service: Urology;  Laterality: Left;   EXTRACORPOREAL SHOCK WAVE LITHOTRIPSY Right 12/08/2020   Procedure: RIGHT EXTRACORPOREAL SHOCK  WAVE LITHOTRIPSY (ESWL);  Surgeon: Raynelle Bring, MD;  [facility];  Service: Urology;  Laterality: Right;   FOOT ARTHRODESIS Right 04/15/2021   Procedure: RIGHT SUBTALAR AND TALONAVICULAR FUSION;  Surgeon: Newt Minion, MD;  Location: [facility];  Service: Orthopedics;  Laterality: Right;   HERNIA REPAIR     IR [place] REMOV OBSTRUC MAT ANY COLON TUBE W/FLUORO  08/27/2021   IR [place] GASTRO/COLONIC TUBE PERCUT W/FLUORO  09/08/2021   ROTATOR CUFF REPAIR     WHIPPLE PROCEDURE  07/13/2021   Patient Active Problem List   Diagnosis Date Noted   Abnormal CT of the abdomen    Pressure injury of skin 09/05/2021   Goals of care, counseling/discussion 09/03/2021   Postoperative anemia due to acute blood loss 08/31/2021   Reactive thrombocytosis 08/31/2021   Hypokalemia 08/31/2021   Malnutrition of moderate degree 08/19/2021   Debility 08/19/2021   Jejunostomy tube present ([place])    Asymptomatic bacteriuria 08/15/2021   H/O Whipple procedure 08/13/2021   Chronic hiccups 08/13/2021   Candida esophagitis ([place]) 08/13/2021   Diabetes ([place]) 08/13/2021   Anemia 08/13/2021   Hypoalbuminemia 08/13/2021   Frequent PVCs 08/13/2021   Prolonged QT interval 08/13/2021   Facial droop 08/13/2021   Hormone replacement therapy 08/13/2021   Lesion of parotid gland 08/13/2021   Protein-calorie malnutrition, severe ([place]) 08/13/2021  Feeding difficulties 08/12/2021   Feeding tube dysfunction, initial encounter 08/12/2021   Posterior tibial tendon dysfunction, right 04/15/2021   Posterior tibial tendinitis, right leg    Prostate cancer ([place]) 02/11/2020   Orthostatic tremor 10/01/2019   Educated about COVID-19 virus infection 09/12/2019   Dyslipidemia 09/12/2019   Nonrheumatic pulmonary valve insufficiency 09/12/2019   Red blood cell antibody positive, compatible PRBC difficult to obtain 02/15/2018   Diplopia 01/03/2018   Benign essential tremor 01/03/2018   Pain in joint of right ankle  05/17/2017   Foot pain 05/17/2017   Peroneal tendinitis 05/17/2017   Depression with anxiety 01/03/2017   Insomnia 05/05/2016   Coronary artery calcification seen on CAT scan    DOE (dyspnea on exertion)    Fatigue 02/27/2015   Elevated homocysteine 09/26/2014   Multiple myeloma ([place]) 09/26/2014   Acquired pes planus of right foot 07/02/2014   Posterior tibial tendon dysfunction 07/02/2014   Encounter for antineoplastic chemotherapy 01/02/2014   Kahler disease ([place]) 01/02/2014   Avitaminosis D 01/02/2014   Amyloidosis ([place]) 03/26/2013   Enlarged lymph node 02/14/2013   Pure hypercholesterolemia 03/29/2011   Memory disorder 09/21/2010   Labile hypertension 09/21/2010    REFERRING DIAG: M21.371 (ICD-10-CM) - Foot drop, right  THERAPY DIAG:  Muscle weakness (generalized)  Difficulty in walking, not elsewhere classified  Rationale for Evaluation and Treatment Rehabilitation  PERTINENT HISTORY: H/O Whipple procedure   Chronic hiccups   Multiple myeloma ([place])   Amyloidosis ([place])   Diabetes ([place])   Depression with anxiety   Prostate cancer ([place])   Goals of care, counseling/discussion   Benign essential tremor   Dyslipidemia  PRECAUTIONS: fall; PEG  SUBJECTIVE: Patient reports doing well. Denies falls/near falls. Patient ambulating into clinic without RW, no issues noted, but slower gait speed. No DVT per imaging.   PAIN:  Are you having pain? No   TODAY'S TREATMENT:   [facility] PT Assessment - 11/26/21 0001       Standardized Balance Assessment   Standardized Balance Assessment Timed Up and Go Test    10 Meter Walk .85ms      Timed Up and Go Test   Normal TUG (seconds) 21.8           THEREX: -scifit hills level 2 x10 mins -R ankle AROM 4 ways   PATIENT EDUCATION: Education details: Continue HEP, outcome measure results, PT POC Person educated: Patient Education method: Explanation Education comprehension: verbalized understanding     HOME EXERCISE  PROGRAM: Access Code: [email or web address]: [email or web address] Date: 11/02/2021 Prepared by: JEstevan Ryder Exercises - Sit to Stand with Counter Support  - 1 x daily - 7 x weekly - 3 sets - 10 reps - Long Sitting Ankle Eversion with Resistance  - 1 x daily - 7 x weekly - 3 sets - 10 reps - Long Sitting Ankle Plantar Flexion with Resistance  - 1 x daily - 7 x weekly - 3 sets - 10 reps - Long Sitting Ankle Inversion with Resistance  - 1 x daily - 7 x weekly - 3 sets - 10 reps - Long Sitting Ankle Dorsiflexion with Anchored Resistance  - 1 x daily - 7 x weekly - 3 sets - 10 reps - Seated Hip Abduction with Resistance  - 1 x daily - 7 x weekly - 3 sets - 10 reps - Ankle Circles on Wobble Board in Sitting  - 1 x daily - 7 x weekly - 3 sets - 10 reps   You Can  Walk For A Certain Length Of Time Each Day                          Walk 10 minutes 2 times per day.             Increase 2  minutes every 2 days              Work up to 45 minutes (1-2 times per day).               Example:                         Day 1-2           4-5 minutes     3 times per day                         Day 7-8           10-12 minutes 2-3 times per day                         Day 13-14       20-22 minutes 1-2 times per day    GOALS: Goals reviewed with patient? Yes   SHORT TERM GOALS: Target date: 11/25/2021   Pt will be independent with initial HEP for improved functional strength   Baseline: to be provided; provided Goal status: MET   2.  Pt will improve TUG to </= 30 secs to demonstrated reduced fall risk   Baseline: 45.04s with RW + CGA; 21.8s with RW and supervision Goal status: MET   3.  Pt will improve gait speed to >/= .33msto demonstrate improved community ambulation   Baseline: .347m; .6070mGoal status: MET   4.  Patient will be able to negotiate at least 4 steps with R HR per home set up and supervision  Baseline: R HR + CGA; R HR + CGA Goal status: NOT MET       LONG TERM  GOALS: Target date: 12/23/2021   Pt will be independent with final HEP for improved functional strength  Baseline: to be provided Goal status: INITIAL   2.  Pt will improve TUG to </= 20 secs to demonstrated reduced fall risk   Baseline: 45.04s with RW + CGA Goal status: INITIAL   3.  Pt will improve gait speed to >/= .76m48mto demonstrate improved community ambulation   Baseline: .79m/57mal status: INITIAL   4.  Patient will negotiate at least 4 steps with HR use per home set up ModI  Baseline: R HR + CGA Goal status: INITIAL       ASSESSMENT:   CLINICAL IMPRESSION: Patient seen for skilled PT session with emphasis on goal assessment and functional strengthening. Patient completed the Timed Up and Go test (TUG) in 21.8 seconds.  Geriatrics: need for further assessment of fall risk: ? 12 sec; Recurrent falls: > 15 sec; Vestibular Disorders fall risk: > 15 sec; Parkinson's Disease fall risk: > 16 sec ([email or web address]). 10 Meter Walk Test: Patient instructed to walk 10 meters (32.8 ft) as quickly and as safely as possible at their normal speed x2 and at a fast speed x2. Time measured from 2 meter mark to 8 meter mark to accommodate ramp-up and ramp-down.  Normal speed: 0.19m/s9mt off scores: <0.4 m/s = household Ambulator,  0.4-0.8 m/s = limited community Ambulator, >0.8 m/s = community Ambulator, >1.2 m/s = crossing a street, <1.0 = increased fall risk MCID 0.05 m/s (small), 0.13 m/s (moderate), 0.06 m/s (significant)  (ANPTA Core Set of Outcome Measures for Adults with Neurologic Conditions, 2018). Patient requesting to attend PT for "as long as possible." PT informing patient that PT is meant to work toward a goal and HEP is meant to assist with maintenance therapy. Patient verbalized understanding. Patient improving R ankle AROM, though still minimal. Continue POC.    OBJECTIVE IMPAIRMENTS Abnormal gait, decreased activity tolerance, decreased balance, decreased endurance,  decreased knowledge of use of DME, decreased mobility, difficulty walking, decreased ROM, decreased strength, impaired sensation, impaired vision/preception, and postural dysfunction.    ACTIVITY LIMITATIONS carrying, lifting, bending, standing, squatting, stairs, transfers, and locomotion level   PARTICIPATION LIMITATIONS: cleaning, laundry, interpersonal relationship, driving, shopping, and community activity   PERSONAL FACTORS Age, Past/current experiences, Time since onset of injury/illness/exacerbation, Transportation, and 3+ comorbidities: multiple myeloma, amyloidosis, CA  are also affecting patient's functional outcome.    REHAB POTENTIAL: Fair extensive medical history and time since onset   CLINICAL DECISION MAKING: Evolving/moderate complexity   EVALUATION COMPLEXITY: Moderate   PLAN: PT FREQUENCY: 2x/week   PT DURATION: 6 weeks   PLANNED INTERVENTIONS: Therapeutic exercises, Therapeutic activity, Neuromuscular re-education, Balance training, Gait training, Patient/Family education, Self Care, Joint mobilization, Stair training, Vestibular training, Visual/preceptual remediation/compensation, Orthotic/Fit training, DME instructions, Aquatic Therapy, Electrical stimulation, Wheelchair mobility training, Manual lymph drainage, Compression bandaging, Manual therapy, and Re-evaluation   PLAN FOR NEXT SESSION: endurance work, ankle strength, posture, ambulatory balance, NMES to R ant tib     Debbora Dus, PT Debbora Dus, PT, DPT, CBIS   11/26/2021, 4:15 PM

## 2021-11-30 ENCOUNTER — Ambulatory Visit: Payer: Medicare Other

## 2021-11-30 DIAGNOSIS — M6281 Muscle weakness (generalized): Secondary | ICD-10-CM | POA: Diagnosis not present

## 2021-11-30 DIAGNOSIS — R262 Difficulty in walking, not elsewhere classified: Secondary | ICD-10-CM

## 2021-11-30 NOTE — Therapy (Signed)
OUTPATIENT PHYSICAL THERAPY TREATMENT NOTE   Patient Name: William Barron MRN: [id number] DOB:23-Jul-1945, 76 y.o., male Today's Date: 11/30/2021  PCP: [name], MD REFERRING PROVIDER: Meridee Score, MD  END OF SESSION:   PT End of Session - 11/30/21 1103     Visit Number 9    Number of Visits 13    Date for PT Re-Evaluation 12/23/21    Authorization Type medicare    Progress Note Due on Visit 10    PT Start Time 1101    PT Stop Time 1142    PT Time Calculation (min) 41 min    Activity Tolerance Patient tolerated treatment well    Behavior During Therapy WFL for tasks assessed/performed             Past Medical History:  Diagnosis Date   Amyloidosis ([place])    Diabetes mellitus without complication ([place])    GERD (gastroesophageal reflux disease)    Hearing loss    Has hearing aids   History of kidney stones    Hyperlipidemia    Multiple myeloma ([place])    and amylodosis    Nephrolithiasis    Occasional tremors    Pneumonia    Prostate cancer ([place])    Prostatitis    acute and chronic   Skin cancer    Past Surgical History:  Procedure Laterality Date   APPENDECTOMY     CARDIAC CATHETERIZATION N/A 06/13/2015   Procedure: Left Heart Cath and Coronary Angiography;  Surgeon: Jettie Booze, MD;  Location: [facility];  Service: Cardiovascular;  Laterality: N/A;   COLONOSCOPY  04/07/2000   ELBOW SURGERY  03/15/2001   right   ESOPHAGOGASTRODUODENOSCOPY (EGD) WITH PROPOFOL N/A 09/07/2021   Procedure: ESOPHAGOGASTRODUODENOSCOPY (EGD) WITH PROPOFOL;  Surgeon: Irene Shipper, MD;  Location: [facility];  Service: Gastroenterology;  Laterality: N/A;   EXTRACORPOREAL SHOCK WAVE LITHOTRIPSY Left 07/17/2018   Procedure: EXTRACORPOREAL SHOCK WAVE LITHOTRIPSY (ESWL);  Surgeon: [name], MD;  Location[facility];  Service: Urology;  Laterality: Left;   EXTRACORPOREAL SHOCK WAVE LITHOTRIPSY Right 12/08/2020   Procedure: RIGHT EXTRACORPOREAL SHOCK WAVE  LITHOTRIPSY (ESWL);  Surgeon: Raynelle Bring, MD;  Location: [facility];  Service: Urology;  Laterality: Right;   FOOT ARTHRODESIS Right 04/15/2021   Procedure: RIGHT SUBTALAR AND TALONAVICULAR FUSION;  Surgeon: Newt Minion, MD;  Location: [place];  Service: Orthopedics;  Laterality: Right;   HERNIA REPAIR     IR MECH REMOV OBSTRUC MAT ANY COLON TUBE W/FLUORO  08/27/2021   IR [place] GASTRO/COLONIC TUBE PERCUT W/FLUORO  09/08/2021   ROTATOR CUFF REPAIR     WHIPPLE PROCEDURE  07/13/2021   Patient Active Problem List   Diagnosis Date Noted   Abnormal CT of the abdomen    Pressure injury of skin 09/05/2021   Goals of care, counseling/discussion 09/03/2021   Postoperative anemia due to acute blood loss 08/31/2021   Reactive thrombocytosis 08/31/2021   Hypokalemia 08/31/2021   Malnutrition of moderate degree 08/19/2021   Debility 08/19/2021   Jejunostomy tube present ([place])    Asymptomatic bacteriuria 08/15/2021   H/O Whipple procedure 08/13/2021   Chronic hiccups 08/13/2021   Candida esophagitis ([place]) 08/13/2021   Diabetes ([place]) 08/13/2021   Anemia 08/13/2021   Hypoalbuminemia 08/13/2021   Frequent PVCs 08/13/2021   Prolonged QT interval 08/13/2021   Facial droop 08/13/2021   Hormone replacement therapy 08/13/2021   Lesion of parotid gland 08/13/2021   Protein-calorie malnutrition, severe ([place]) 08/13/2021   Feeding  difficulties 08/12/2021   Feeding tube dysfunction, initial encounter 08/12/2021   Posterior tibial tendon dysfunction, right 04/15/2021   Posterior tibial tendinitis, right leg    Prostate cancer ([place]) 02/11/2020   Orthostatic tremor 10/01/2019   Educated about COVID-19 virus infection 09/12/2019   Dyslipidemia 09/12/2019   Nonrheumatic pulmonary valve insufficiency 09/12/2019   Red blood cell antibody positive, compatible PRBC difficult to obtain 02/15/2018   Diplopia 01/03/2018   Benign essential tremor 01/03/2018   Pain in joint of right ankle  05/17/2017   Foot pain 05/17/2017   Peroneal tendinitis 05/17/2017   Depression with anxiety 01/03/2017   Insomnia 05/05/2016   Coronary artery calcification seen on CAT scan    DOE (dyspnea on exertion)    Fatigue 02/27/2015   Elevated homocysteine 09/26/2014   Multiple myeloma ([place]) 09/26/2014   Acquired pes planus of right foot 07/02/2014   Posterior tibial tendon dysfunction 07/02/2014   Encounter for antineoplastic chemotherapy 01/02/2014   Kahler disease ([place]) 01/02/2014   Avitaminosis D 01/02/2014   Amyloidosis ([place]) 03/26/2013   Enlarged lymph node 02/14/2013   Pure hypercholesterolemia 03/29/2011   Memory disorder 09/21/2010   Labile hypertension 09/21/2010    REFERRING DIAG: M21.371 (ICD-10-CM) - Foot drop, right  THERAPY DIAG:  Muscle weakness (generalized)  Difficulty in walking, not elsewhere classified  Rationale for Evaluation and Treatment Rehabilitation  PERTINENT HISTORY: H/O Whipple procedure   Chronic hiccups   Multiple myeloma ([place])   Amyloidosis ([place])   Diabetes ([place])   Depression with anxiety   Prostate cancer ([place])   Goals of care, counseling/discussion   Benign essential tremor   Dyslipidemia  PRECAUTIONS: fall  SUBJECTIVE: Patient reports doing well. Denies falls/near falls. Patient reports that he's been riding his recumbent bike "6/7 days" a week. He's been riding for 10 mins at a time and states that he's pretty tired after that.   PAIN:  Are you having pain? No   TODAY'S TREATMENT:   THEREX: - R ankle AROM 4 ways  AROM DF: -22* AROM PF: 28* AROM inversion: 4* AROM eversion: 9*  -R ankle wobble board proprioceptive exercise (CW/CCW)  -scifit hills level 4 x10 mins  PATIENT EDUCATION: Education details: Continue HEP Person educated: Patient Education method: Explanation Education comprehension: verbalized understanding     HOME EXERCISE PROGRAM: Access Code: [email or web address] URL: [email or web address] Date:  11/02/2021 Prepared by: Estevan Ryder  Exercises - Sit to Stand with Counter Support  - 1 x daily - 7 x weekly - 3 sets - 10 reps - Long Sitting Ankle Eversion with Resistance  - 1 x daily - 7 x weekly - 3 sets - 10 reps - Long Sitting Ankle Plantar Flexion with Resistance  - 1 x daily - 7 x weekly - 3 sets - 10 reps - Long Sitting Ankle Inversion with Resistance  - 1 x daily - 7 x weekly - 3 sets - 10 reps - Long Sitting Ankle Dorsiflexion with Anchored Resistance  - 1 x daily - 7 x weekly - 3 sets - 10 reps - Seated Hip Abduction with Resistance  - 1 x daily - 7 x weekly - 3 sets - 10 reps - Ankle Circles on Wobble Board in Sitting  - 1 x daily - 7 x weekly - 3 sets - 10 reps   You Can Walk For A Certain Length Of Time Each Day  Walk 10 minutes 2 times per day.             Increase 2  minutes every 2 days              Work up to 45 minutes (1-2 times per day).               Example:                         Day 1-2           4-5 minutes     3 times per day                         Day 7-8           10-12 minutes 2-3 times per day                         Day 13-14       20-22 minutes 1-2 times per day    GOALS: Goals reviewed with patient? Yes   SHORT TERM GOALS: Target date: 11/25/2021   Pt will be independent with initial HEP for improved functional strength   Baseline: to be provided; provided Goal status: MET   2.  Pt will improve TUG to </= 30 secs to demonstrated reduced fall risk   Baseline: 45.04s with RW + CGA; 21.8s with RW and supervision Goal status: MET   3.  Pt will improve gait speed to >/= .82msto demonstrate improved community ambulation   Baseline: .327m; .6042mGoal status: MET   4.  Patient will be able to negotiate at least 4 steps with R HR per home set up and supervision  Baseline: R HR + CGA; R HR + CGA Goal status: NOT MET       LONG TERM GOALS: Target date: 12/23/2021   Pt will be independent with final HEP for  improved functional strength  Baseline: to be provided Goal status: INITIAL   2.  Pt will improve TUG to </= 20 secs to demonstrated reduced fall risk   Baseline: 45.04s with RW + CGA Goal status: INITIAL   3.  Pt will improve gait speed to >/= .62m47mto demonstrate improved community ambulation   Baseline: .5m/97mal status: INITIAL   4.  Patient will negotiate at least 4 steps with HR use per home set up ModI  Baseline: R HR + CGA Goal status: INITIAL       ASSESSMENT:   CLINICAL IMPRESSION: Patient seen for skilled PT session with emphasis on R ankle and LE functional strengthening. Patient demonstrating improved AROM, though still limited in R ankle. Discussed that prolonged hospitalization can result in significant weakness and will take time to recover from. He verbalized understanding. Continue POC.    OBJECTIVE IMPAIRMENTS Abnormal gait, decreased activity tolerance, decreased balance, decreased endurance, decreased knowledge of use of DME, decreased mobility, difficulty walking, decreased ROM, decreased strength, impaired sensation, impaired vision/preception, and postural dysfunction.    ACTIVITY LIMITATIONS carrying, lifting, bending, standing, squatting, stairs, transfers, and locomotion level   PARTICIPATION LIMITATIONS: cleaning, laundry, interpersonal relationship, driving, shopping, and community activity   PERSONAL FACTORS Age, Past/current experiences, Time since onset of injury/illness/exacerbation, Transportation, and 3+ comorbidities: multiple myeloma, amyloidosis, CA  are also affecting patient's functional outcome.    REHAB POTENTIAL: Fair extensive medical history and time since onset  CLINICAL DECISION MAKING: Evolving/moderate complexity   EVALUATION COMPLEXITY: Moderate   PLAN: PT FREQUENCY: 2x/week   PT DURATION: 6 weeks   PLANNED INTERVENTIONS: Therapeutic exercises, Therapeutic activity, Neuromuscular re-education, Balance training, Gait  training, Patient/Family education, Self Care, Joint mobilization, Stair training, Vestibular training, Visual/preceptual remediation/compensation, Orthotic/Fit training, DME instructions, Aquatic Therapy, Electrical stimulation, Wheelchair mobility training, Manual lymph drainage, Compression bandaging, Manual therapy, and Re-evaluation   PLAN FOR NEXT SESSION: endurance work, ankle strength, posture, ambulatory balance, NMES to R ant tib     Debbora Dus, PT Debbora Dus, PT, DPT, CBIS   11/30/2021, 11:51 AM

## 2021-12-01 ENCOUNTER — Ambulatory Visit (HOSPITAL_COMMUNITY): Payer: Medicare Other | Attending: Cardiology

## 2021-12-01 DIAGNOSIS — I371 Nonrheumatic pulmonary valve insufficiency: Secondary | ICD-10-CM

## 2021-12-01 LAB — ECHOCARDIOGRAM COMPLETE
AR max vel: 1.56 cm2
AV Area VTI: 1.48 cm2
AV Area mean vel: 1.44 cm2
AV Mean grad: 16 mmHg
AV Peak grad: 29.8 mmHg
Ao pk vel: 2.73 m/s
Area-P 1/2: 3.91 cm2
P 1/2 time: 447 msec
S' Lateral: 3.9 cm

## 2021-12-03 ENCOUNTER — Ambulatory Visit: Payer: Medicare Other | Admitting: Physical Therapy

## 2021-12-03 ENCOUNTER — Encounter: Payer: Self-pay | Admitting: Physical Therapy

## 2021-12-03 DIAGNOSIS — R262 Difficulty in walking, not elsewhere classified: Secondary | ICD-10-CM

## 2021-12-03 DIAGNOSIS — M6281 Muscle weakness (generalized): Secondary | ICD-10-CM | POA: Diagnosis not present

## 2021-12-03 NOTE — Therapy (Signed)
OUTPATIENT PHYSICAL THERAPY TREATMENT NOTE/PROGRESS NOTE   Patient Name: William Barron MRN: [id number] DOB:[birth year], 76 y.o., male Today's Date: 12/04/2021  PCP: [name], MD REFERRING PROVIDER: Meridee Score, MD  PT progress note for William Barron.  Reporting period 10/28/2021 to 12/03/2021  See Note below for Objective Data and Assessment of Progress/Goals  Thank you for the referral of this patient. Elease Etienne, PT, DPT   END OF SESSION:   PT End of Session - 12/03/21 1109     Visit Number 10    Number of Visits 13    Date for PT Re-Evaluation 12/23/21    Authorization Type medicare    Progress Note Due on Visit 10    PT Start Time 1106    PT Stop Time 1147    PT Time Calculation (min) 41 min    Equipment Utilized During Treatment Gait belt    Activity Tolerance Patient tolerated treatment well    Behavior During Therapy WFL for tasks assessed/performed             Past Medical History:  Diagnosis Date   Amyloidosis ([place])    Diabetes mellitus without complication ([place])    GERD (gastroesophageal reflux disease)    Hearing loss    Has hearing aids   History of kidney stones    Hyperlipidemia    Multiple myeloma ([facility])    and amylodosis    Nephrolithiasis    Occasional tremors    Pneumonia    Prostate cancer ([place])    Prostatitis    acute and chronic   Skin cancer    Past Surgical History:  Procedure Laterality Date   APPENDECTOMY     CARDIAC CATHETERIZATION N/A 06/13/2015   Procedure: Left Heart Cath and Coronary Angiography;  Surgeon: Jettie Booze, MD;  Location: [facility];  Service: Cardiovascular;  Laterality: N/A;   COLONOSCOPY  04/07/2000   ELBOW SURGERY  03/15/2001   right   ESOPHAGOGASTRODUODENOSCOPY (EGD) WITH PROPOFOL N/A 09/07/2021   Procedure: ESOPHAGOGASTRODUODENOSCOPY (EGD) WITH PROPOFOL;  Surgeon: Irene Shipper, MD;  Location: [facility];  Service: Gastroenterology;  Laterality: N/A;    EXTRACORPOREAL SHOCK WAVE LITHOTRIPSY Left 07/17/2018   Procedure: EXTRACORPOREAL SHOCK WAVE LITHOTRIPSY (ESWL);  Surgeon: [name], MD;  [facility];  Service: Urology;  Laterality: Left;   EXTRACORPOREAL SHOCK WAVE LITHOTRIPSY Right 12/08/2020   Procedure: RIGHT EXTRACORPOREAL SHOCK WAVE LITHOTRIPSY (ESWL);  Surgeon: Raynelle Bring, MD;  Location: [facility];  Service: Urology;  Laterality: Right;   FOOT ARTHRODESIS Right 04/15/2021   Procedure: RIGHT SUBTALAR AND TALONAVICULAR FUSION;  Surgeon: Newt Minion, MD;  Location: [place] [facility];  Service: Orthopedics;  Laterality: Right;   HERNIA REPAIR     IR MECH REMOV OBSTRUC MAT ANY COLON TUBE W/FLUORO  08/27/2021   IR [place] GASTRO/COLONIC TUBE PERCUT W/FLUORO  09/08/2021   ROTATOR CUFF REPAIR     WHIPPLE PROCEDURE  07/13/2021   Patient Active Problem List   Diagnosis Date Noted   Abnormal CT of the abdomen    Pressure injury of skin 09/05/2021   Goals of care, counseling/discussion 09/03/2021   Postoperative anemia due to acute blood loss 08/31/2021   Reactive thrombocytosis 08/31/2021   Hypokalemia 08/31/2021   Malnutrition of moderate degree 08/19/2021   Debility 08/19/2021   Jejunostomy tube present ([place])    Asymptomatic bacteriuria 08/15/2021   H/O Whipple procedure 08/13/2021   Chronic hiccups 08/13/2021   Candida esophagitis ([place]) 08/13/2021   Diabetes ([place])  08/13/2021   Anemia 08/13/2021   Hypoalbuminemia 08/13/2021   Frequent PVCs 08/13/2021   Prolonged QT interval 08/13/2021   Facial droop 08/13/2021   Hormone replacement therapy 08/13/2021   Lesion of parotid gland 08/13/2021   Protein-calorie malnutrition, severe ([place]) 08/13/2021   Feeding difficulties 08/12/2021   Feeding tube dysfunction, initial encounter 08/12/2021   Posterior tibial tendon dysfunction, right 04/15/2021   Posterior tibial tendinitis, right leg    Prostate cancer ([place]) 02/11/2020   Orthostatic tremor 10/01/2019   Educated about  COVID-19 virus infection 09/12/2019   Dyslipidemia 09/12/2019   Nonrheumatic pulmonary valve insufficiency 09/12/2019   Red blood cell antibody positive, compatible PRBC difficult to obtain 02/15/2018   Diplopia 01/03/2018   Benign essential tremor 01/03/2018   Pain in joint of right ankle 05/17/2017   Foot pain 05/17/2017   Peroneal tendinitis 05/17/2017   Depression with anxiety 01/03/2017   Insomnia 05/05/2016   Coronary artery calcification seen on CAT scan    DOE (dyspnea on exertion)    Fatigue 02/27/2015   Elevated homocysteine 09/26/2014   Multiple myeloma ([place]) 09/26/2014   Acquired pes planus of right foot 07/02/2014   Posterior tibial tendon dysfunction 07/02/2014   Encounter for antineoplastic chemotherapy 01/02/2014   Kahler disease ([place]) 01/02/2014   Avitaminosis D 01/02/2014   Amyloidosis ([place]) 03/26/2013   Enlarged lymph node 02/14/2013   Pure hypercholesterolemia 03/29/2011   Memory disorder 09/21/2010   Labile hypertension 09/21/2010    REFERRING DIAG: M21.371 (ICD-10-CM) - Foot drop, right  THERAPY DIAG:  Muscle weakness (generalized)  Difficulty in walking, not elsewhere classified  Rationale for Evaluation and Treatment Rehabilitation  PERTINENT HISTORY: H/O Whipple procedure   Chronic hiccups   Multiple myeloma ([place])   Amyloidosis ([place])   Diabetes ([place])   Depression with anxiety   Prostate cancer ([place])   Goals of care, counseling/discussion   Benign essential tremor   Dyslipidemia  PRECAUTIONS: fall  SUBJECTIVE: Patient reports doing well. Denies falls/near falls. He rode his recumbent bike last night.    PAIN:  Are you having pain? No   TODAY'S TREATMENT:  RAMP:  Level of Assistance: SBA and CGA Assistive device utilized: None Ramp Comments: Pt demonstrates persistent forward lean.  Pt initiates sitting immediately upon second descent requiring prolonged rest.  STAIRS:  Level of Assistance: SBA and CGA  Stair Negotiation  Technique: Step to Pattern Alternating Pattern  Sideways Forwards with Single Rail on Right Bilateral Rails  Number of Stairs: 3x4   Height of Stairs: 6"  Comments: Practiced fwd and lateral step-to and reciprocal ascent and descent w/ pt needing CGA for reciprocal descent.  Demonstrated variation of use of BUE on handrail w/ repeat demo to pt to improve lateral stepping.  Pt demonstrates crossover lateral step on initial lateral stepping attempt w/ correction and edu for safety.  GAIT: Gait pattern: step through pattern, decreased arm swing- Right, decreased arm swing- Left, decreased stride length, decreased ankle dorsiflexion- Right, knee flexed in stance- Right, knee flexed in stance- Left, trunk flexed, wide BOS, poor foot clearance- Right, and poor foot clearance- Left Distance walked: various clinic distances Assistive device utilized: None Level of assistance: SBA and CGA  Wide 4" step taps > whole foot w/ push off to mimic wide stride and inc right ankle and knee control in closed chain (LUE on back of chair and CGA)  PATIENT EDUCATION: Education details: Continue HEP.  Edu to trial bike at home without the brace on to allow the ankle  to engage more to task, instructed to wear brace until seated safely on bike then remove. Person educated: Patient Education method: Explanation Education comprehension: verbalized understanding     HOME EXERCISE PROGRAM: Access Code: [email or web address] URL: [email or web address] Date: 11/02/2021 Prepared by: Estevan Ryder  Exercises - Sit to Stand with Counter Support  - 1 x daily - 7 x weekly - 3 sets - 10 reps - Long Sitting Ankle Eversion with Resistance  - 1 x daily - 7 x weekly - 3 sets - 10 reps - Long Sitting Ankle Plantar Flexion with Resistance  - 1 x daily - 7 x weekly - 3 sets - 10 reps - Long Sitting Ankle Inversion with Resistance  - 1 x daily - 7 x weekly - 3 sets - 10 reps - Long Sitting Ankle Dorsiflexion with Anchored  Resistance  - 1 x daily - 7 x weekly - 3 sets - 10 reps - Seated Hip Abduction with Resistance  - 1 x daily - 7 x weekly - 3 sets - 10 reps - Ankle Circles on Wobble Board in Sitting  - 1 x daily - 7 x weekly - 3 sets - 10 reps   You Can Walk For A Certain Length Of Time Each Day                          Walk 10 minutes 2 times per day.             Increase 2  minutes every 2 days              Work up to 45 minutes (1-2 times per day).               Example:                         Day 1-2           4-5 minutes     3 times per day                         Day 7-8           10-12 minutes 2-3 times per day                         Day 13-14       20-22 minutes 1-2 times per day    GOALS: Goals reviewed with patient? Yes   SHORT TERM GOALS: Target date: 11/25/2021   Pt will be independent with initial HEP for improved functional strength   Baseline: to be provided; provided Goal status: MET   2.  Pt will improve TUG to </= 30 secs to demonstrated reduced fall risk   Baseline: 45.04s with RW + CGA; 21.8s with RW and supervision Goal status: MET   3.  Pt will improve gait speed to >/= .39msto demonstrate improved community ambulation   Baseline: .380m; .6071mGoal status: MET   4.  Patient will be able to negotiate at least 4 steps with R HR per home set up and supervision  Baseline: R HR + CGA; R HR + CGA Goal status: NOT MET       LONG TERM GOALS: Target date: 12/23/2021   Pt will be independent with final HEP for improved functional strength  Baseline: to be provided Goal status: INITIAL   2.  Pt will improve TUG to </= 20 secs to demonstrated reduced fall risk   Baseline: 45.04s with RW + CGA Goal status: INITIAL   3.  Pt will improve gait speed to >/= .7ms  to demonstrate improved community ambulation   Baseline: .381m Goal status: INITIAL   4.  Patient will negotiate at least 4 steps with HR use per home set up ModI  Baseline: R HR + CGA Goal status:  INITIAL       ASSESSMENT:   CLINICAL IMPRESSION: Focus of skilled session today on continuing to address ankle mobility, stair safety, and general activity tolerance.  He initiates seated rest often without warning so continues to need edu on safety.  Will continue to address deficits as noted in coming visits per skilled PT POC.   OBJECTIVE IMPAIRMENTS Abnormal gait, decreased activity tolerance, decreased balance, decreased endurance, decreased knowledge of use of DME, decreased mobility, difficulty walking, decreased ROM, decreased strength, impaired sensation, impaired vision/preception, and postural dysfunction.    ACTIVITY LIMITATIONS carrying, lifting, bending, standing, squatting, stairs, transfers, and locomotion level   PARTICIPATION LIMITATIONS: cleaning, laundry, interpersonal relationship, driving, shopping, and community activity   PERSONAL FACTORS Age, Past/current experiences, Time since onset of injury/illness/exacerbation, Transportation, and 3+ comorbidities: multiple myeloma, amyloidosis, CA  are also affecting patient's functional outcome.    REHAB POTENTIAL: Fair extensive medical history and time since onset   CLINICAL DECISION MAKING: Evolving/moderate complexity   EVALUATION COMPLEXITY: Moderate   PLAN: PT FREQUENCY: 2x/week   PT DURATION: 6 weeks   PLANNED INTERVENTIONS: Therapeutic exercises, Therapeutic activity, Neuromuscular re-education, Balance training, Gait training, Patient/Family education, Self Care, Joint mobilization, Stair training, Vestibular training, Visual/preceptual remediation/compensation, Orthotic/Fit training, DME instructions, Aquatic Therapy, Electrical stimulation, Wheelchair mobility training, Manual lymph drainage, Compression bandaging, Manual therapy, and Re-evaluation   PLAN FOR NEXT SESSION: endurance work, ankle strength, posture, ambulatory balance, NMES to R ant tib     MaBary RichardPT, DPT 12/04/2021, 9:29 AM

## 2021-12-07 ENCOUNTER — Encounter: Payer: Self-pay | Admitting: *Deleted

## 2021-12-07 ENCOUNTER — Emergency Department (HOSPITAL_BASED_OUTPATIENT_CLINIC_OR_DEPARTMENT_OTHER): Payer: Medicare Other

## 2021-12-07 ENCOUNTER — Ambulatory Visit: Payer: Medicare Other

## 2021-12-07 ENCOUNTER — Encounter (HOSPITAL_BASED_OUTPATIENT_CLINIC_OR_DEPARTMENT_OTHER): Payer: Self-pay | Admitting: Emergency Medicine

## 2021-12-07 ENCOUNTER — Emergency Department (HOSPITAL_BASED_OUTPATIENT_CLINIC_OR_DEPARTMENT_OTHER)
Admission: EM | Admit: 2021-12-07 | Discharge: 2021-12-07 | Disposition: A | Discharge status: Home / Self Care | Payer: Medicare Other | Attending: Emergency Medicine | Admitting: Emergency Medicine

## 2021-12-07 DIAGNOSIS — Y9289 Other specified places as the place of occurrence of the external cause: Secondary | ICD-10-CM | POA: Diagnosis not present

## 2021-12-07 DIAGNOSIS — W1839XA Other fall on same level, initial encounter: Secondary | ICD-10-CM | POA: Insufficient documentation

## 2021-12-07 DIAGNOSIS — S0181XA Laceration without foreign body of other part of head, initial encounter: Secondary | ICD-10-CM | POA: Diagnosis not present

## 2021-12-07 DIAGNOSIS — W19XXXA Unspecified fall, initial encounter: Secondary | ICD-10-CM

## 2021-12-07 DIAGNOSIS — S0993XA Unspecified injury of face, initial encounter: Secondary | ICD-10-CM | POA: Diagnosis present

## 2021-12-07 DIAGNOSIS — E162 Hypoglycemia, unspecified: Secondary | ICD-10-CM | POA: Diagnosis not present

## 2021-12-07 DIAGNOSIS — M6281 Muscle weakness (generalized): Secondary | ICD-10-CM | POA: Diagnosis not present

## 2021-12-07 DIAGNOSIS — R262 Difficulty in walking, not elsewhere classified: Secondary | ICD-10-CM

## 2021-12-07 LAB — CBC WITH DIFFERENTIAL/PLATELET
Abs Immature Granulocytes: 0.05 10*3/uL (ref 0.00–0.07)
Basophils Absolute: 0.1 10*3/uL (ref 0.0–0.1)
Basophils Relative: 1 %
Eosinophils Absolute: 0.2 10*3/uL (ref 0.0–0.5)
Eosinophils Relative: 3 %
HCT: 27.4 % — ABNORMAL LOW (ref 39.0–52.0)
Hemoglobin: 8.2 g/dL — ABNORMAL LOW (ref 13.0–17.0)
Immature Granulocytes: 1 %
Lymphocytes Relative: 13 %
Lymphs Abs: 1 10*3/uL (ref 0.7–4.0)
MCH: 24.9 pg — ABNORMAL LOW (ref 26.0–34.0)
MCHC: 29.9 g/dL — ABNORMAL LOW (ref 30.0–36.0)
MCV: 83.3 fL (ref 80.0–100.0)
Monocytes Absolute: 0.6 10*3/uL (ref 0.1–1.0)
Monocytes Relative: 8 %
Neutro Abs: 5.7 10*3/uL (ref 1.7–7.7)
Neutrophils Relative %: 74 %
Platelets: 407 10*3/uL — ABNORMAL HIGH (ref 150–400)
RBC: 3.29 MIL/uL — ABNORMAL LOW (ref 4.22–5.81)
RDW: 19.8 % — ABNORMAL HIGH (ref 11.5–15.5)
WBC: 7.6 10*3/uL (ref 4.0–10.5)
nRBC: 0 % (ref 0.0–0.2)

## 2021-12-07 LAB — BASIC METABOLIC PANEL
Anion gap: 8 (ref 5–15)
BUN: 12 mg/dL (ref 8–23)
CO2: 25 mmol/L (ref 22–32)
Calcium: 8.4 mg/dL — ABNORMAL LOW (ref 8.9–10.3)
Chloride: 108 mmol/L (ref 98–111)
Creatinine, Ser: 1.06 mg/dL (ref 0.61–1.24)
GFR, Estimated: >60 mL/min (ref >60)
Glucose, Bld: 60 mg/dL — ABNORMAL LOW (ref 70–99)
Potassium: 3.4 mmol/L — ABNORMAL LOW (ref 3.5–5.1)
Sodium: 141 mmol/L (ref 135–145)

## 2021-12-07 LAB — CBG MONITORING, ED
Glucose-Capillary: 107 mg/dL — ABNORMAL HIGH (ref 70–99)
Glucose-Capillary: 52 mg/dL — ABNORMAL LOW (ref 70–99)
Glucose-Capillary: 63 mg/dL — ABNORMAL LOW (ref 70–99)

## 2021-12-07 MED ORDER — LIDOCAINE-EPINEPHRINE (PF) 2 %-1:200000 IJ SOLN
10.0000 mL | Freq: Once | INTRAMUSCULAR | Status: AC
  Administered 2021-12-07: 10 mL via INTRADERMAL
  Filled 2021-12-07: qty 20

## 2021-12-07 NOTE — ED Notes (Signed)
CBG 52. Pt given 2 cups of orange juice and graham crackers and peanut butter. Pt A&O, in NAD at this time.

## 2021-12-07 NOTE — Discharge Instructions (Addendum)
Return for redness drainage or if you get a fever.  The sutures that were used are dissolvable that should dissolve between day 3 and day 5.  If they are still there then you can gently plucked them out with tweezers.  The area can get wet but not fully immersed underwater.  No scrubbing.  If you really want to clean it you can apply a half-and-half hydrogen peroxide solution with water on a Q-tip.  You can apply an ointment a couple times a day this could be as simple as Vaseline but could also be an antibiotic ointment if you wish..  Once it is healed please try to avoid prolonged sun exposure use sunscreen.  Gells that have silicone antigens have been shown to reduce scarring and some research.  Follow-up with a plastic surgeon if you wish.  Your blood sugar was low here today.  I would have you stop doing your insulin with your meals until you are able to discuss this with your family doctor.  You can continue doing the long-acting insulin.

## 2021-12-07 NOTE — ED Provider Notes (Signed)
[facility] Provider Note   CSN: [id number] Arrival date & time: 12/07/21  1846     History  Chief Complaint  Patient presents with   Fall   Leg Swelling    LYNKIN SAINI is a 76 y.o. male.  76 yo M with a chief complaints of a fall.  This is the patient's second fall today.  He has not had a fall in a couple months.  He thinks he got his feet tangled on each other.  Has foot drop in the right lower extremity after having a Whipple procedure performed.  He has been seeing Dr. Sharol Given for this.  Has had under physical therapy.  His wife is very concerned about the left leg swollen.  He stopped wearing compression hose about 48 hours ago because they are difficult to get on.   Fall       Home Medications Prior to Admission medications   Medication Sig Start Date End Date Taking? Authorizing Provider  Continuous Blood Gluc Receiver (FREESTYLE LIBRE 2 READER) DEVI Use as instructed to check blood sugars 11/20/21   Elayne Snare, MD  Continuous Blood Gluc Sensor (FREESTYLE LIBRE 2 SENSOR) MISC Change every 14 days 11/17/21   Elayne Snare, MD  CREON [phone] units CPEP capsule Take by mouth. 11/09/21   [provider]  furosemide (LASIX) 20 MG tablet Take 1 tablet (20 mg total) by mouth daily. For 3 days, then as needed. 11/20/21 02/18/22  Minus Breeding, MD  insulin aspart (NOVOLOG FLEXPEN) 100 UNIT/ML FlexPen Inject 0-10 Units into the skin 4 (four) times daily. Per sliding scale: CBG     0-200: 0 units CBG 201-250: 2 units CBG 251-300: 4 units CBG 301-350: 6 units CBG 351-400: 8 units CBG   >   400: 10 units    [provider]  insulin degludec (TRESIBA FLEXTOUCH) 100 UNIT/ML FlexTouch Pen Inject 15 Units into the skin daily. 11/17/21   Elayne Snare, MD  megestrol (MEGACE) 400 MG/10ML suspension Place 10 mLs (400 mg total) into feeding tube 2 (two) times daily. 08/31/21   Love, Ivan Anchors, PA-C  melatonin 3 MG TABS tablet Place 2 tablets  (6 mg total) into feeding tube at bedtime as needed. Patient taking differently: Place 6 mg into feeding tube at bedtime as needed (sleep). 08/31/21   Love, Ivan Anchors, PA-C  pantoprazole sodium (PROTONIX) 40 mg Place 40 mg into feeding tube daily. Patient taking differently: Place 40 mg into feeding tube daily. Pantoprazole DR 40 mg granules 08/31/21   Love, Ivan Anchors, PA-C  Testosterone 20.25 MG/ACT (1.62%) GEL Apply 4 Pump topically daily.    [provider]      Allergies    Patient has no known allergies.    Review of Systems   Review of Systems  Physical Exam Updated Vital Signs BP (!) 157/73   Pulse 76   Temp 98.9 F (37.2 C)   Resp 18   SpO2 95%  Physical Exam Vitals and nursing note reviewed.  Constitutional:      Appearance: He is well-developed.  HENT:     Head: Normocephalic.     Comments: Stellate wound to the forehead Eyes:     Pupils: Pupils are equal, round, and reactive to light.  Neck:     Vascular: No JVD.  Cardiovascular:     Rate and Rhythm: Normal rate and regular rhythm.     Heart sounds: No murmur heard.    No friction rub.  No gallop.  Pulmonary:     Effort: No respiratory distress.     Breath sounds: No wheezing.  Abdominal:     General: There is no distension.     Tenderness: There is no abdominal tenderness. There is no guarding or rebound.  Musculoskeletal:        General: Normal range of motion.     Cervical back: Normal range of motion and neck supple.     Left lower leg: Edema present.     Comments: 2+ pitting edema up to the knee.  Pulse motor and sensation intact distally.  Skin:    Coloration: Skin is not pale.     Findings: No rash.  Neurological:     Mental Status: He is alert and oriented to person, place, and time.  Psychiatric:        Behavior: Behavior normal.     ED Results / Procedures / Treatments   Labs (all labs ordered are listed, but only abnormal results are displayed) Labs Reviewed  CBC WITH  DIFFERENTIAL/PLATELET - Abnormal; Notable for the following components:      Result Value   RBC 3.29 (*)    Hemoglobin 8.2 (*)    HCT 27.4 (*)    MCH 24.9 (*)    MCHC 29.9 (*)    RDW 19.8 (*)    Platelets 407 (*)    All other components within normal limits  BASIC METABOLIC PANEL - Abnormal; Notable for the following components:   Potassium 3.4 (*)    Glucose, Bld 60 (*)    Calcium 8.4 (*)    All other components within normal limits  CBG MONITORING, ED - Abnormal; Notable for the following components:   Glucose-Capillary 52 (*)    All other components within normal limits  CBG MONITORING, ED - Abnormal; Notable for the following components:   Glucose-Capillary 63 (*)    All other components within normal limits  CBG MONITORING, ED - Abnormal; Notable for the following components:   Glucose-Capillary 107 (*)    All other components within normal limits    EKG None  Radiology US Venous Img Lower Unilateral Left  Result Date: 12/07/2021 CLINICAL DATA:  Left leg pain and swelling EXAM: Left LOWER EXTREMITY VENOUS DOPPLER ULTRASOUND TECHNIQUE: Gray-scale sonography with compression, as well as color and duplex ultrasound, were performed to evaluate the deep venous system(s) from the level of the common femoral vein through the popliteal and proximal calf veins. COMPARISON:  None Available. FINDINGS: VENOUS Normal compressibility of the common femoral, superficial femoral, and popliteal veins, as well as the visualized calf veins. Visualized portions of profunda femoral vein and great saphenous vein unremarkable. No filling defects to suggest DVT on grayscale or color Doppler imaging. Doppler waveforms show normal direction of venous flow, normal respiratory plasticity and response to augmentation. Limited views of the contralateral common femoral vein are unremarkable. OTHER Moderate subcutaneous edema at the calf. Limitations: none IMPRESSION: Negative for acute DVT. Moderate  subcutaneous edema in the calf. Electronically Signed   By: Placido Sou M.D.   On: 12/07/2021 21:24   CT Cervical Spine Wo Contrast  Result Date: 12/07/2021 CLINICAL DATA:  Acute neck pain. EXAM: CT CERVICAL SPINE WITHOUT CONTRAST TECHNIQUE: Multidetector CT imaging of the cervical spine was performed without intravenous contrast. Multiplanar CT image reconstructions were also generated. RADIATION DOSE REDUCTION: This exam was performed according to the departmental dose-optimization program which includes automated exposure control, adjustment of the mA and/or kV according to  patient size and/or use of iterative reconstruction technique. COMPARISON:  None Available. FINDINGS: Alignment: There is 2 mm of anterolisthesis at C4-C5 which is favored is degenerative. Alignment is otherwise anatomic. Skull base and vertebrae: No acute fracture. No primary bone lesion or focal pathologic process. Soft tissues and spinal canal: No prevertebral fluid or swelling. No visible canal hematoma. Disc levels: There is disc space narrowing at C3-C4, C5-C6 and C6-C7 with endplate osteophyte formation compatible with degenerative change. There is bilateral facet arthropathy at C3-C4 and C4-C5. No severe central canal or neural foraminal stenosis at any level. Upper chest: Negative. Other: None. IMPRESSION: No acute fracture or traumatic subluxation of the cervical spine. Electronically Signed   By: Ronney Asters M.D.   On: 12/07/2021 20:37   CT Head Wo Contrast  Result Date: 12/07/2021 CLINICAL DATA:  Head trauma, minor (Age >= 65y). EXAM: CT HEAD WITHOUT CONTRAST TECHNIQUE: Contiguous axial images were obtained from the base of the skull through the vertex without intravenous contrast. RADIATION DOSE REDUCTION: This exam was performed according to the departmental dose-optimization program which includes automated exposure control, adjustment of the mA and/or kV according to patient size and/or use of iterative  reconstruction technique. COMPARISON:  02/08/2021 FINDINGS: Brain: No acute intracranial abnormality. Specifically, no hemorrhage, hydrocephalus, mass lesion, acute infarction, or significant intracranial injury. There is atrophy and chronic small vessel disease changes. Vascular: No hyperdense vessel or unexpected calcification. Skull: No acute calvarial abnormality. Sinuses/Orbits: No acute findings Other: None IMPRESSION: Atrophy, chronic microvascular disease. No acute intracranial abnormality. Electronically Signed   By: Rolm Baptise M.D.   On: 12/07/2021 20:32    Procedures .Marland KitchenLaceration Repair  Date/Time: 12/07/2021 9:58 PM  Performed by: Deno Etienne, DO Authorized by: Deno Etienne, DO   Consent:    Consent obtained:  Verbal   Consent given by:  Patient   Risks, benefits, and alternatives were discussed: yes     Risks discussed:  Infection, pain, poor cosmetic result and poor wound healing   Alternatives discussed:  No treatment and delayed treatment Universal protocol:    Procedure explained and questions answered to patient or proxy's satisfaction: yes     Patient identity confirmed:  Verbally with patient Anesthesia:    Anesthesia method:  Local infiltration   Local anesthetic:  Lidocaine 2% WITH epi Laceration details:    Location:  Face   Face location:  Forehead   Length (cm):  2.7 Pre-procedure details:    Preparation:  Patient was prepped and draped in usual sterile fashion Exploration:    Limited defect created (wound extended): no     Hemostasis achieved with:  Epinephrine and direct pressure   Imaging obtained comment:  CT   Imaging outcome: foreign body not noted     Wound exploration: entire depth of wound visualized     Wound extent: no underlying fracture noted     Contaminated: no   Treatment:    Area cleansed with:  Saline   Amount of cleaning:  Standard   Irrigation solution:  Sterile saline   Irrigation volume:  40   Irrigation method:  Syringe    Visualized foreign bodies/material removed: no     Debridement:  None   Undermining:  None   Scar revision: no   Skin repair:    Repair method:  Sutures   Suture size:  5-0   Suture material:  Fast-absorbing gut   Suture technique:  Simple interrupted   Number of sutures:  3 Approximation:  Approximation:  Close Repair type:    Repair type:  Simple Post-procedure details:    Dressing:  Open (no dressing)   Procedure completion:  Tolerated well, no immediate complications     Medications Ordered in ED Medications  lidocaine-EPINEPHrine (XYLOCAINE W/EPI) 2 %-1:200000 (PF) injection 10 mL (10 mLs Intradermal Given by Other 12/07/21 2141)    ED Course/ Medical Decision Making/ A&P                           Medical Decision Making Amount and/or Complexity of Data Reviewed Labs: ordered. Radiology: ordered.  Risk Prescription drug management.   76 yo M with a chief complaint of a fall.  Nonsyncopal by history.  This is actually his second fall today.  Has a history of right-sided foot drop has had worsening left lower extremity edema that his wife is very concerned about.  He stopped wearing compression hose because they felt like they were too tight.    With 2 falls today we will obtain a laboratory evaluation.  CT of the head and C-spine are negative for intracranial or intraspinal pathology.  He denies any other injury in the fall.  We will repair his wound at bedside.  DVT study of the left lower extremity.  DVT study negative.  BMP with hypoglycemia.  CBG also consistent with hypoglycemia.  Blood sugar 52.  Given oral trial here with some improvement of symptoms.  Blood sugar repeated still in the 60s.  Will attempt further oral trial.   CBG 100, d/c home.    11:39 PM:  I have discussed the diagnosis/risks/treatment options with the patient.  Evaluation and diagnostic testing in the emergency department does not suggest an emergent condition requiring admission or  immediate intervention beyond what has been performed at this time.  They will follow up with  PCP, Endo. We also discussed returning to the ED immediately if new or worsening sx occur. We discussed the sx which are most concerning (e.g., sudden worsening pain, fever, inability to tolerate by mouth) that necessitate immediate return. Medications administered to the patient during their visit and any new prescriptions provided to the patient are listed below.  Medications given during this visit Medications  lidocaine-EPINEPHrine (XYLOCAINE W/EPI) 2 %-1:200000 (PF) injection 10 mL (10 mLs Intradermal Given by Other 12/07/21 2141)     The patient appears reasonably screen and/or stabilized for discharge and I doubt any other medical condition or other [facility] requiring further screening, evaluation, or treatment in the ED at this time prior to discharge.           Final Clinical Impression(s) / ED Diagnoses Final diagnoses:  Fall, initial encounter  Forehead laceration, initial encounter  Hypoglycemia    Rx / DC Orders ED Discharge Orders     None         Deno Etienne, DO 12/07/21 2339

## 2021-12-07 NOTE — ED Triage Notes (Signed)
Fall today at 5pm at home Hit head on baseboard, denies loc No blood thinners. Denies any dizziness or n/v. Lac in center of forehead.  C/o swelling in legs and leg weakness for the last few weeks. Has not been wearing compression stockings

## 2021-12-07 NOTE — ED Notes (Signed)
Pt given 2 cups of orange juice

## 2021-12-07 NOTE — Therapy (Signed)
OUTPATIENT PHYSICAL THERAPY TREATMENT NOTE/PROGRESS NOTE   Patient Name: William Barron MRN: [id number] DOB:01-29-1946, 76 y.o., male Today's Date: 12/07/2021  PCP: [name], MD REFERRING PROVIDER: Meridee Score, MD  PT progress note for William Barron.  Reporting period 10/28/2021 to 12/03/2021  See Note below for Objective Data and Assessment of Progress/Goals  Thank you for the referral of this patient. Elease Etienne, PT, DPT   END OF SESSION:   PT End of Session - 12/07/21 1108     Visit Number 11    Number of Visits 13    Date for PT Re-Evaluation 12/23/21    Authorization Type medicare    PT Start Time 1116   patient late and then needed to use the restroom   PT Stop Time 1144    PT Time Calculation (min) 28 min    Activity Tolerance Patient tolerated treatment well    Behavior During Therapy WFL for tasks assessed/performed             Past Medical History:  Diagnosis Date   Amyloidosis ([place])    Diabetes mellitus without complication ([place])    GERD (gastroesophageal reflux disease)    Hearing loss    Has hearing aids   History of kidney stones    Hyperlipidemia    Multiple myeloma ([facility])    and amylodosis    Nephrolithiasis    Occasional tremors    Pneumonia    Prostate cancer ([place])    Prostatitis    acute and chronic   Skin cancer    Past Surgical History:  Procedure Laterality Date   APPENDECTOMY     CARDIAC CATHETERIZATION N/A 06/13/2015   Procedure: Left Heart Cath and Coronary Angiography;  Surgeon: Jettie Booze, MD;  Location: [facility];  Service: Cardiovascular;  Laterality: N/A;   COLONOSCOPY  04/07/2000   ELBOW SURGERY  03/15/2001   right   ESOPHAGOGASTRODUODENOSCOPY (EGD) WITH PROPOFOL N/A 09/07/2021   Procedure: ESOPHAGOGASTRODUODENOSCOPY (EGD) WITH PROPOFOL;  Surgeon: Irene Shipper, MD;  Location: [facility];  Service: Gastroenterology;  Laterality: N/A;   EXTRACORPOREAL SHOCK WAVE LITHOTRIPSY Left  07/17/2018   Procedure: EXTRACORPOREAL SHOCK WAVE LITHOTRIPSY (ESWL);  Surgeon: [name], MD;  Location: [facility];  Service: Urology;  Laterality: Left;   EXTRACORPOREAL SHOCK WAVE LITHOTRIPSY Right 12/08/2020   Procedure: RIGHT EXTRACORPOREAL SHOCK WAVE LITHOTRIPSY (ESWL);  Surgeon: Raynelle Bring, MD;  Location: [facility];  Service: Urology;  Laterality: Right;   FOOT ARTHRODESIS Right 04/15/2021   Procedure: RIGHT SUBTALAR AND TALONAVICULAR FUSION;  Surgeon: Newt Minion, MD;  Location: [facility];  Service: Orthopedics;  Laterality: Right;   HERNIA REPAIR     IR [place] REMOV OBSTRUC MAT ANY COLON TUBE W/FLUORO  08/27/2021   IR [place] GASTRO/COLONIC TUBE PERCUT W/FLUORO  09/08/2021   ROTATOR CUFF REPAIR     WHIPPLE PROCEDURE  07/13/2021   Patient Active Problem List   Diagnosis Date Noted   Abnormal CT of the abdomen    Pressure injury of skin 09/05/2021   Goals of care, counseling/discussion 09/03/2021   Postoperative anemia due to acute blood loss 08/31/2021   Reactive thrombocytosis 08/31/2021   Hypokalemia 08/31/2021   Malnutrition of moderate degree 08/19/2021   Debility 08/19/2021   Jejunostomy tube present ([place])    Asymptomatic bacteriuria 08/15/2021   H/O Whipple procedure 08/13/2021   Chronic hiccups 08/13/2021   Candida esophagitis ([place]) 08/13/2021   Diabetes ([place]) 08/13/2021   Anemia 08/13/2021   Hypoalbuminemia  08/13/2021   Frequent PVCs 08/13/2021   Prolonged QT interval 08/13/2021   Facial droop 08/13/2021   Hormone replacement therapy 08/13/2021   Lesion of parotid gland 08/13/2021   Protein-calorie malnutrition, severe ([place]) 08/13/2021   Feeding difficulties 08/12/2021   Feeding tube dysfunction, initial encounter 08/12/2021   Posterior tibial tendon dysfunction, right 04/15/2021   Posterior tibial tendinitis, right leg    Prostate cancer ([place]) 02/11/2020   Orthostatic tremor 10/01/2019   Educated about COVID-19 virus infection 09/12/2019    Dyslipidemia 09/12/2019   Nonrheumatic pulmonary valve insufficiency 09/12/2019   Red blood cell antibody positive, compatible PRBC difficult to obtain 02/15/2018   Diplopia 01/03/2018   Benign essential tremor 01/03/2018   Pain in joint of right ankle 05/17/2017   Foot pain 05/17/2017   Peroneal tendinitis 05/17/2017   Depression with anxiety 01/03/2017   Insomnia 05/05/2016   Coronary artery calcification seen on CAT scan    DOE (dyspnea on exertion)    Fatigue 02/27/2015   Elevated homocysteine 09/26/2014   Multiple myeloma ([place]) 09/26/2014   Acquired pes planus of right foot 07/02/2014   Posterior tibial tendon dysfunction 07/02/2014   Encounter for antineoplastic chemotherapy 01/02/2014   Kahler disease ([place]) 01/02/2014   Avitaminosis D 01/02/2014   Amyloidosis ([place]) 03/26/2013   Enlarged lymph node 02/14/2013   Pure hypercholesterolemia 03/29/2011   Memory disorder 09/21/2010   Labile hypertension 09/21/2010    REFERRING DIAG: M21.371 (ICD-10-CM) - Foot drop, right  THERAPY DIAG:  Muscle weakness (generalized)  Difficulty in walking, not elsewhere classified  Rationale for Evaluation and Treatment Rehabilitation  PERTINENT HISTORY: H/O Whipple procedure   Chronic hiccups   Multiple myeloma ([place])   Amyloidosis ([place])   Diabetes ([place])   Depression with anxiety   Prostate cancer ([place])   Goals of care, counseling/discussion   Benign essential tremor   Dyslipidemia  PRECAUTIONS: fall  SUBJECTIVE: Patient reports doing well. Patient did catch his toes and fall stepping onto his porch this AM coming to PT. Denies hitting his head or other injury. Was able to get up himself. Patient remains with significant L LE swelling (3+ pitting edema) and notes that he and his wife decided that he would no longer wear the compression socks.   PAIN:  Are you having pain? No   TODAY'S TREATMENT:  NMR:  -seated ankle 4 ways (improving AROM noted)  -scifit level 3 hills x10  mins B LE only   PATIENT EDUCATION: Education details: Continue HEP.   Person educated: Patient Education method: Explanation Education comprehension: verbalized understanding     HOME EXERCISE PROGRAM: Access Code: [email or web address] URL: [email or web address] Date: 11/02/2021 Prepared by: Estevan Ryder  Exercises - Sit to Stand with Counter Support  - 1 x daily - 7 x weekly - 3 sets - 10 reps - Long Sitting Ankle Eversion with Resistance  - 1 x daily - 7 x weekly - 3 sets - 10 reps - Long Sitting Ankle Plantar Flexion with Resistance  - 1 x daily - 7 x weekly - 3 sets - 10 reps - Long Sitting Ankle Inversion with Resistance  - 1 x daily - 7 x weekly - 3 sets - 10 reps - Long Sitting Ankle Dorsiflexion with Anchored Resistance  - 1 x daily - 7 x weekly - 3 sets - 10 reps - Seated Hip Abduction with Resistance  - 1 x daily - 7 x weekly - 3 sets - 10 reps - Ankle Circles on Wobble Board in Sitting  -  1 x daily - 7 x weekly - 3 sets - 10 reps   You Can Walk For A Certain Length Of Time Each Day                          Walk 10 minutes 2 times per day.             Increase 2  minutes every 2 days              Work up to 45 minutes (1-2 times per day).               Example:                         Day 1-2           4-5 minutes     3 times per day                         Day 7-8           10-12 minutes 2-3 times per day                         Day 13-14       20-22 minutes 1-2 times per day    GOALS: Goals reviewed with patient? Yes   SHORT TERM GOALS: Target date: 11/25/2021   Pt will be independent with initial HEP for improved functional strength   Baseline: to be provided; provided Goal status: MET   2.  Pt will improve TUG to </= 30 secs to demonstrated reduced fall risk   Baseline: 45.04s with RW + CGA; 21.8s with RW and supervision Goal status: MET   3.  Pt will improve gait speed to >/= .40m/sto demonstrate improved community ambulation   Baseline: .37m/s;  .80m/s Goal status: MET   4.  Patient will be able to negotiate at least 4 steps with R HR per home set up and supervision  Baseline: R HR + CGA; R HR + CGA Goal status: NOT MET       LONG TERM GOALS: Target date: 12/23/2021   Pt will be independent with final HEP for improved functional strength  Baseline: to be provided Goal status: INITIAL   2.  Pt will improve TUG to </= 20 secs to demonstrated reduced fall risk   Baseline: 45.04s with RW + CGA Goal status: INITIAL   3.  Pt will improve gait speed to >/= .53m/s  to demonstrate improved community ambulation   Baseline: .24m/s Goal status: INITIAL   4.  Patient will negotiate at least 4 steps with HR use per home set up ModI  Baseline: R HR + CGA Goal status: INITIAL       ASSESSMENT:   CLINICAL IMPRESSION: Patient seen for skilled PT session with emphasis on R ankle mobility and strength. Session limited by patients need to use restroom. He does continue to have significant LE swelling L >R. AROM has improved in his R ankle, but still requires use of AFO for safety during gait and all functional mobility. Continue POC.    OBJECTIVE IMPAIRMENTS Abnormal gait, decreased activity tolerance, decreased balance, decreased endurance, decreased knowledge of use of DME, decreased mobility, difficulty walking, decreased ROM, decreased strength, impaired sensation, impaired vision/preception, and postural dysfunction.    ACTIVITY LIMITATIONS carrying, lifting, bending, standing, squatting,  stairs, transfers, and locomotion level   PARTICIPATION LIMITATIONS: cleaning, laundry, interpersonal relationship, driving, shopping, and community activity   PERSONAL FACTORS Age, Past/current experiences, Time since onset of injury/illness/exacerbation, Transportation, and 3+ comorbidities: multiple myeloma, amyloidosis, CA  are also affecting patient's functional outcome.    REHAB POTENTIAL: Fair extensive medical history and time since  onset   CLINICAL DECISION MAKING: Evolving/moderate complexity   EVALUATION COMPLEXITY: Moderate   PLAN: PT FREQUENCY: 2x/week   PT DURATION: 6 weeks   PLANNED INTERVENTIONS: Therapeutic exercises, Therapeutic activity, Neuromuscular re-education, Balance training, Gait training, Patient/Family education, Self Care, Joint mobilization, Stair training, Vestibular training, Visual/preceptual remediation/compensation, Orthotic/Fit training, DME instructions, Aquatic Therapy, Electrical stimulation, Wheelchair mobility training, Manual lymph drainage, Compression bandaging, Manual therapy, and Re-evaluation   PLAN FOR NEXT SESSION: assess LTG and d/c     Debbora Dus, PT, DPT Debbora Dus, PT, DPT, CBIS  12/07/2021, 11:50 AM

## 2021-12-08 ENCOUNTER — Encounter: Payer: Medicare Other | Attending: Endocrinology | Admitting: Nutrition

## 2021-12-08 DIAGNOSIS — E1165 Type 2 diabetes mellitus with hyperglycemia: Secondary | ICD-10-CM | POA: Insufficient documentation

## 2021-12-08 NOTE — Patient Instructions (Addendum)
Change sensor every 14 days Give 10u of Tresiba every morning Give 4u of Humalog for small meals, 6u for medium meals and 8u for larger meals. Make sure all meals have at least 2 servings of carbohydrate, and some protein and small amount of fat at each meal.  Call Freestyle LIbre help line if questions about using the sensor.

## 2021-12-08 NOTE — Progress Notes (Signed)
Patient is here with daughter and care giver to discuss insulin dosing and blood sugar readings.  Daughter reports feeling very overwhelmed and not sure she is giving right insulin for meals, and how to adjust Tresiba dose.  She has brought in [place] pen and Humalog pens.   We discussed how/where to give the insulin and timing.  They are giving 10u of Tresiba before breakfast at 7 q AM.  They are giving Humalog 6/8/10u for small, medium and large meals 30 minutes before meals.   Patient is alert and oriented, but daughter says he is eating all of the time, and very large quantities of food at each meal.   Discussed the idea of making sure all meals have protein , carbohydrate and fat at each meal, and which foods fall into each category.  Patient eats at will, and will have small bag of M&Ms, or orange slices before or after meals, and likes to drink apple juice.   Pt. Came in today because he fell yesterday and went to the ER with a blood sugar of 52.  That day for lunch he had cheese toast, chicken noodle soup, above candy, 6 crackers with cheese, and at 5PM went to dinner and ordered a hamburger, but did not eat this because he did not like it.  He ate 1/2 order of eggplant parmigiana with unsweet tea to drink.  At 6:30 he got up from the table and fell hitting his head they took him to the ER and blood sugar at 7:30 was 52.  He reported no symptoms of low blood sugar during this time  Discussed symptoms and proper treatments of low blood sugar,  Also discussed timing of the 2 insulins and the need to take Humalog before meals, but that all meals need sufficient carbs and protein to keep blood sugar from dropping.  Daughter says this is all too much for her to learn and feels uncomfortable giving this meal time insulin because the MD at the ER said he was taking too much insulin After speaking with Dr. Dwyane Dee we dropped the insulin dose down by 2u with meals.   They were trained on how to use the [place] 3  and this was linked to his phone and to [place].  We discussed the difference between sensor glucose and blood sugar readings, and when it will be needed to test his blood sugar.  They reported good understanding  of this.  A sensor was started in his right arm and linked to the reader and to his phone.  I suggested they meet with the dietitian to learn amounts of food needed at each meal.  Suggested she write down what he is eating for 2-3 days before coming in.  Note sent to Mickel Baas to schedule this appointment ASAP.   They had no final questions

## 2021-12-10 ENCOUNTER — Telehealth: Payer: Self-pay | Admitting: Dietician

## 2021-12-10 ENCOUNTER — Ambulatory Visit: Payer: Medicare Other

## 2021-12-10 DIAGNOSIS — R262 Difficulty in walking, not elsewhere classified: Secondary | ICD-10-CM

## 2021-12-10 DIAGNOSIS — M6281 Muscle weakness (generalized): Secondary | ICD-10-CM | POA: Diagnosis not present

## 2021-12-10 NOTE — Therapy (Signed)
OUTPATIENT PHYSICAL THERAPY TREATMENT NOTE/DISCHARGE SUMMARY   Patient Name: William Barron MRN: [id number] DOB:Aug 26, 1945, 76 y.o., male Today's Date: 12/10/2021  PCP: [name], MD REFERRING PROVIDER: Meridee Score, MD  PHYSICAL THERAPY DISCHARGE SUMMARY  Visits from Start of Care: 12  Current functional level related to goals / functional outcomes: ModI without an AD, but using R AFO   Remaining deficits: Intermittent R foot drag/decreased clearance resulting in LOB   Education / Equipment: PT POC, HEP, outcome measure results, use of AFO/skin checks, walking without hands behind the back   Patient agrees to discharge. Patient goals were partially met. Patient is being discharged due to meeting the stated rehab goals.    END OF SESSION:   PT End of Session - 12/10/21 1101     Visit Number 12    Number of Visits 13    Date for PT Re-Evaluation 12/23/21    Authorization Type medicare    Progress Note Due on Visit 82    PT Start Time 1101    PT Stop Time 1130   discharge   PT Time Calculation (min) 29 min    Equipment Utilized During Treatment Gait belt    Activity Tolerance Patient tolerated treatment well    Behavior During Therapy WFL for tasks assessed/performed             Past Medical History:  Diagnosis Date   Amyloidosis ([place])    Diabetes mellitus without complication ([place])    GERD (gastroesophageal reflux disease)    Hearing loss    Has hearing aids   History of kidney stones    Hyperlipidemia    Multiple myeloma ([facility])    and amylodosis    Nephrolithiasis    Occasional tremors    Pneumonia    Prostate cancer ([place])    Prostatitis    acute and chronic   Skin cancer    Past Surgical History:  Procedure Laterality Date   APPENDECTOMY     CARDIAC CATHETERIZATION N/A 06/13/2015   Procedure: Left Heart Cath and Coronary Angiography;  Surgeon: Jettie Booze, MD;  Location: [facility];  Service: Cardiovascular;  Laterality:  N/A;   COLONOSCOPY  04/07/2000   ELBOW SURGERY  03/15/2001   right   ESOPHAGOGASTRODUODENOSCOPY (EGD) WITH PROPOFOL N/A 09/07/2021   Procedure: ESOPHAGOGASTRODUODENOSCOPY (EGD) WITH PROPOFOL;  Surgeon: Irene Shipper, MD;  Location: [facility];  Service: Gastroenterology;  Laterality: N/A;   EXTRACORPOREAL SHOCK WAVE LITHOTRIPSY Left 07/17/2018   Procedure: EXTRACORPOREAL SHOCK WAVE LITHOTRIPSY (ESWL);  Surgeon: [name], MD;  [facility];  Service: Urology;  Laterality: Left;   EXTRACORPOREAL SHOCK WAVE LITHOTRIPSY Right 12/08/2020   Procedure: RIGHT EXTRACORPOREAL SHOCK WAVE LITHOTRIPSY (ESWL);  Surgeon: Raynelle Bring, MD;  Location[facility];  Service: Urology;  Laterality: Right;   FOOT ARTHRODESIS Right 04/15/2021   Procedure: RIGHT SUBTALAR AND TALONAVICULAR FUSION;  Surgeon: Newt Minion, MD;  Location: [facility];  Service: Orthopedics;  Laterality: Right;   HERNIA REPAIR     IR MECH REMOV OBSTRUC MAT ANY COLON TUBE W/FLUORO  08/27/2021   IR [place] GASTRO/COLONIC TUBE PERCUT W/FLUORO  09/08/2021   ROTATOR CUFF REPAIR     WHIPPLE PROCEDURE  07/13/2021   Patient Active Problem List   Diagnosis Date Noted   Abnormal CT of the abdomen    Pressure injury of skin 09/05/2021   Goals of care, counseling/discussion 09/03/2021   Postoperative anemia due to acute blood loss 08/31/2021  Reactive thrombocytosis 08/31/2021   Hypokalemia 08/31/2021   Malnutrition of moderate degree 08/19/2021   Debility 08/19/2021   Jejunostomy tube present ([place])    Asymptomatic bacteriuria 08/15/2021   H/O Whipple procedure 08/13/2021   Chronic hiccups 08/13/2021   Candida esophagitis ([place]) 08/13/2021   Diabetes ([place]) 08/13/2021   Anemia 08/13/2021   Hypoalbuminemia 08/13/2021   Frequent PVCs 08/13/2021   Prolonged QT interval 08/13/2021   Facial droop 08/13/2021   Hormone replacement therapy 08/13/2021   Lesion of parotid gland 08/13/2021   Protein-calorie malnutrition,  severe ([place]) 08/13/2021   Feeding difficulties 08/12/2021   Feeding tube dysfunction, initial encounter 08/12/2021   Posterior tibial tendon dysfunction, right 04/15/2021   Posterior tibial tendinitis, right leg    Prostate cancer ([place]) 02/11/2020   Orthostatic tremor 10/01/2019   Educated about COVID-19 virus infection 09/12/2019   Dyslipidemia 09/12/2019   Nonrheumatic pulmonary valve insufficiency 09/12/2019   Red blood cell antibody positive, compatible PRBC difficult to obtain 02/15/2018   Diplopia 01/03/2018   Benign essential tremor 01/03/2018   Pain in joint of right ankle 05/17/2017   Foot pain 05/17/2017   Peroneal tendinitis 05/17/2017   Depression with anxiety 01/03/2017   Insomnia 05/05/2016   Coronary artery calcification seen on CAT scan    DOE (dyspnea on exertion)    Fatigue 02/27/2015   Elevated homocysteine 09/26/2014   Multiple myeloma ([place]) 09/26/2014   Acquired pes planus of right foot 07/02/2014   Posterior tibial tendon dysfunction 07/02/2014   Encounter for antineoplastic chemotherapy 01/02/2014   Kahler disease ([place]) 01/02/2014   Avitaminosis D 01/02/2014   Amyloidosis ([place]) 03/26/2013   Enlarged lymph node 02/14/2013   Pure hypercholesterolemia 03/29/2011   Memory disorder 09/21/2010   Labile hypertension 09/21/2010    REFERRING DIAG: M21.371 (ICD-10-CM) - Foot drop, right  THERAPY DIAG:  Muscle weakness (generalized)  Difficulty in walking, not elsewhere classified  Rationale for Evaluation and Treatment Rehabilitation  PERTINENT HISTORY: H/O Whipple procedure   Chronic hiccups   Multiple myeloma ([place])   Amyloidosis ([place])   Diabetes ([place])   Depression with anxiety   Prostate cancer ([place])   Goals of care, counseling/discussion   Benign essential tremor   Dyslipidemia  PRECAUTIONS: fall  SUBJECTIVE: Patient reports doing well since his fall. He fell again after PT on Monday and had to get stitches on his forehead. Imaging clear.  States he feels as though he stood up too fast and lost his balance. He feels as though his fluctuating blood sugar may have contributed as well- did have AFO on.   PAIN:  Are you having pain? No   TODAY'S TREATMENT:   [facility] PT Assessment - 12/10/21 0001       Standardized Balance Assessment   Standardized Balance Assessment Timed Up and Go Test    10 Meter Walk .20ms      Timed Up and Go Test   Normal TUG (seconds) 13.6   no AD             [facility] PT Assessment - 12/10/21 0001       Standardized Balance Assessment   Standardized Balance Assessment Timed Up and Go Test    10 Meter Walk .743m      Timed Up and Go Test   Normal TUG (seconds) 13.6   no AD           PATIENT EDUCATION: Education details: Continue HEP, PT POC, outcome measure results, walking without hands behind the back, fall precautions  Person educated: Patient Education method: Explanation Education comprehension: verbalized understanding     HOME EXERCISE PROGRAM: Access Code: [email or web address] URL: [email or web address] Date: 11/02/2021 Prepared by: Estevan Ryder  Exercises - Sit to Stand with Counter Support  - 1 x daily - 7 x weekly - 3 sets - 10 reps - Long Sitting Ankle Eversion with Resistance  - 1 x daily - 7 x weekly - 3 sets - 10 reps - Long Sitting Ankle Plantar Flexion with Resistance  - 1 x daily - 7 x weekly - 3 sets - 10 reps - Long Sitting Ankle Inversion with Resistance  - 1 x daily - 7 x weekly - 3 sets - 10 reps - Long Sitting Ankle Dorsiflexion with Anchored Resistance  - 1 x daily - 7 x weekly - 3 sets - 10 reps - Seated Hip Abduction with Resistance  - 1 x daily - 7 x weekly - 3 sets - 10 reps - Ankle Circles on Wobble Board in Sitting  - 1 x daily - 7 x weekly - 3 sets - 10 reps   You Can Walk For A Certain Length Of Time Each Day                          Walk 10 minutes 2 times per day.             Increase 2  minutes every 2 days              Work up to 45  minutes (1-2 times per day).               Example:                         Day 1-2           4-5 minutes     3 times per day                         Day 7-8           10-12 minutes 2-3 times per day                         Day 13-14       20-22 minutes 1-2 times per day    GOALS: Goals reviewed with patient? Yes   SHORT TERM GOALS: Target date: 11/25/2021   Pt will be independent with initial HEP for improved functional strength   Baseline: to be provided; provided Goal status: MET   2.  Pt will improve TUG to </= 30 secs to demonstrated reduced fall risk   Baseline: 45.04s with RW + CGA; 21.8s with RW and supervision Goal status: MET   3.  Pt will improve gait speed to >/= .74msto demonstrate improved community ambulation   Baseline: .380m; .6020mGoal status: MET   4.  Patient will be able to negotiate at least 4 steps with R HR per home set up and supervision  Baseline: R HR + CGA; R HR + CGA Goal status: NOT MET       LONG TERM GOALS: Target date: 12/23/2021   Pt will be independent with final HEP for improved functional strength  Baseline: to be provided; provided Goal status: MET   2.  Pt will improve TUG to </= 20 secs to demonstrated reduced fall risk  Baseline: 45.04s with RW + CGA; 13.6s + no AD Goal status: MET   3.  Pt will improve gait speed to >/= .29ms  to demonstrate improved community ambulation   Baseline: .340m; .7520mwith no AD Goal status: MET   4.  Patient will negotiate at least 4 steps with HR use per home set up ModI  Baseline: R HR + CGA; R HR + supervision Goal status: PARTIALLY MET       ASSESSMENT:   CLINICAL IMPRESSION: Patient seen for skilled PT session with emphasis on goal assessment and dc. Patient completed the Timed Up and Go test (TUG) in 13.6 seconds with no AD Geriatrics: need for further assessment of fall risk: ? 12 sec; Recurrent falls: > 15 sec; Vestibular Disorders fall risk: > 15 sec; Parkinson's Disease  fall risk: > 16 sec ([email or web address]). 10 Meter Walk Test: Patient instructed to walk 10 meters (32.8 ft) as quickly and as safely as possible at their normal speed x2 and at a fast speed x2. Time measured from 2 meter mark to 8 meter mark to accommodate ramp-up and ramp-down.  Normal speed: .37m37mith no AD Cut off scores: <0.4 m/s = household Ambulator, 0.4-0.8 m/s = limited community Ambulator, >0.8 m/s = community Ambulator, >1.2 m/s = crossing a street, <1.0 = increased fall risk MCID 0.05 m/s (small), 0.13 m/s (moderate), 0.06 m/s (significant)  (ANPTA Core Set of Outcome Measures for Adults with Neurologic Conditions, 2018). He met 3/4 LTG and is progressing well toward completing stairs at a ModI level- he does currently require supervision. Patient to dc from PT at this time.    OBJECTIVE IMPAIRMENTS Abnormal gait, decreased activity tolerance, decreased balance, decreased endurance, decreased knowledge of use of DME, decreased mobility, difficulty walking, decreased ROM, decreased strength, impaired sensation, impaired vision/preception, and postural dysfunction.    ACTIVITY LIMITATIONS carrying, lifting, bending, standing, squatting, stairs, transfers, and locomotion level   PARTICIPATION LIMITATIONS: cleaning, laundry, interpersonal relationship, driving, shopping, and community activity   PERSONAL FACTORS Age, Past/current experiences, Time since onset of injury/illness/exacerbation, Transportation, and 3+ comorbidities: multiple myeloma, amyloidosis, CA  are also affecting patient's functional outcome.    REHAB POTENTIAL: Fair extensive medical history and time since onset   CLINICAL DECISION MAKING: Evolving/moderate complexity   EVALUATION COMPLEXITY: Moderate   PLAN: PT FREQUENCY: 2x/week   PT DURATION: 6 weeks   PLANNED INTERVENTIONS: Therapeutic exercises, Therapeutic activity, Neuromuscular re-education, Balance training, Gait training, Patient/Family education, Self  Care, Joint mobilization, Stair training, Vestibular training, Visual/preceptual remediation/compensation, Orthotic/Fit training, DME instructions, Aquatic Therapy, Electrical stimulation, Wheelchair mobility training, Manual lymph drainage, Compression bandaging, Manual therapy, and Re-evaluation   PLAN FOR NEXT SESSION: d/c from PT  [place], DPT JennDebbora Dus, DPT, CBIS  12/10/2021, 11:36 AM

## 2021-12-10 NOTE — Telephone Encounter (Signed)
Called patient re:  need of nutrition appointment due to recent low blood glucose and resent start of insulin.  Patient was not available.  Left message for him to call [facility] [phone].  Antonieta Iba, RD, LDN, CDCES

## 2021-12-14 ENCOUNTER — Ambulatory Visit (INDEPENDENT_AMBULATORY_CARE_PROVIDER_SITE_OTHER): Payer: Medicare Other | Admitting: Orthopedic Surgery

## 2021-12-14 ENCOUNTER — Telehealth: Payer: Self-pay | Admitting: Endocrinology

## 2021-12-14 ENCOUNTER — Encounter: Payer: Self-pay | Admitting: Orthopedic Surgery

## 2021-12-14 DIAGNOSIS — R931 Abnormal findings on diagnostic imaging of heart and coronary circulation: Secondary | ICD-10-CM | POA: Diagnosis not present

## 2021-12-14 DIAGNOSIS — M21371 Foot drop, right foot: Secondary | ICD-10-CM | POA: Diagnosis not present

## 2021-12-14 NOTE — Progress Notes (Signed)
Office Visit Note   Patient: William Barron           Date of Birth: 1945/09/30           MRN: [id number] Visit Date: 12/14/2021              Requested by: [name], MD [address] [place],  [place] [address] PCP: [name], MD  Chief Complaint  Patient presents with   Right Foot - Follow-up      HPI: Patient is a 76 year old gentleman with foot drop on the right and posterior tibial tendon insufficiency on the right.  Patient has a new anterior AFO on the right.  Patient states that he is currently getting insulin shots and has had episodes of low glucose patient also had a recent fall which appears to be orthostatic hypotension combined with low glucose.  Assessment & Plan: Visit Diagnoses:  1. Foot drop, right     Plan: Patient is to follow-up with his primary care physician to adjust his insulin dosages he will follow-up with [facility] add additional padding to the anterior part of the orthotic.  Recommended daily strengthening exercises and recommended a rollator type walker.  Patient will try a extra-large compression sock on the left to get over his knee to help decrease the swelling in the knee and the thigh.  Follow-Up Instructions: Return in about 4 weeks (around 01/11/2022).   Ortho Exam  Patient is alert, oriented, no adenopathy, well-dressed, normal affect, normal respiratory effort. Examination patient has swelling of the entire left lower extremity including the thigh and calf.  Patient has had 2 ultrasounds that were negative for DVT.  Patient's compression sock is tight proximally.  And has swelling that extends up through the thigh.  Patient will need a compression sock to incorporate the knee.  Examination of the right foot the AFO is applying pressure to the tibial crest we will have this modified.  Patient is able to ambulate better with the anterior AFO.  With patient's recent episode of orthostatic hypotension and low blood sugar will have him  get a rollator type walker.  Imaging: No results found. No images are attached to the encounter.  Labs: Lab Results  Component Value Date   HGBA1C 7.4 (H) 11/13/2021   HGBA1C 7.8 (H) 04/10/2021   REPTSTATUS 09/06/2021 FINAL 09/03/2021   CULT (A) 09/03/2021    LACTOBACILLUS FERMENTUM Standardized susceptibility testing for this organism is not available. Performed at [facility], [place] [address] [place], [place] [address]    LABORGA KLEBSIELLA PNEUMONIAE (A) 08/11/2021   LABORGA CITROBACTER FREUNDII (A) 08/11/2021     Lab Results  Component Value Date   ALBUMIN 2.1 (L) 09/10/2021   ALBUMIN 2.1 (L) 09/09/2021   ALBUMIN 2.1 (L) 09/06/2021    Lab Results  Component Value Date   MG 2.1 09/10/2021   MG 1.9 09/09/2021   MG 1.8 09/08/2021   Lab Results  Component Value Date   VD25OH 24.1 (L) 09/26/2014    No results found for: "PREALBUMIN"    Latest Ref Rng & Units 12/07/2021    9:16 PM 09/10/2021    4:32 AM 09/09/2021    3:06 AM  CBC EXTENDED  WBC 4.0 - 10.5 K/uL 7.6  7.0  6.9   RBC 4.22 - 5.81 MIL/uL 3.29  3.01  3.04   Hemoglobin 13.0 - 17.0 g/dL 8.2  8.3  8.1   HCT 39.0 - 52.0 % 27.4  26.2  26.8  Platelets 150 - 400 K/uL 407  365  396   NEUT# 1.7 - 7.7 K/uL 5.7     Lymph# 0.7 - 4.0 K/uL 1.0        There is no height or weight on file to calculate BMI.  Orders:  No orders of the defined types were placed in this encounter.  No orders of the defined types were placed in this encounter.    Procedures: No procedures performed  Clinical Data: No additional findings.  ROS:  All other systems negative, except as noted in the HPI. Review of Systems  Objective: Vital Signs: There were no vitals taken for this visit.  Specialty Comments:  No specialty comments available.  PMFS History: Patient Active Problem List   Diagnosis Date Noted   Abnormal CT of the abdomen    Pressure injury of skin 09/05/2021   Goals of care, counseling/discussion  09/03/2021   Postoperative anemia due to acute blood loss 08/31/2021   Reactive thrombocytosis 08/31/2021   Hypokalemia 08/31/2021   Malnutrition of moderate degree 08/19/2021   Debility 08/19/2021   Jejunostomy tube present ([place])    Asymptomatic bacteriuria 08/15/2021   H/O Whipple procedure 08/13/2021   Chronic hiccups 08/13/2021   Candida esophagitis ([place]) 08/13/2021   Diabetes ([place]) 08/13/2021   Anemia 08/13/2021   Hypoalbuminemia 08/13/2021   Frequent PVCs 08/13/2021   Prolonged QT interval 08/13/2021   Facial droop 08/13/2021   Hormone replacement therapy 08/13/2021   Lesion of parotid gland 08/13/2021   Protein-calorie malnutrition, severe ([place]) 08/13/2021   Feeding difficulties 08/12/2021   Feeding tube dysfunction, initial encounter 08/12/2021   Posterior tibial tendon dysfunction, right 04/15/2021   Posterior tibial tendinitis, right leg    Prostate cancer ([place]) 02/11/2020   Orthostatic tremor 10/01/2019   Educated about COVID-19 virus infection 09/12/2019   Dyslipidemia 09/12/2019   Nonrheumatic pulmonary valve insufficiency 09/12/2019   Red blood cell antibody positive, compatible PRBC difficult to obtain 02/15/2018   Diplopia 01/03/2018   Benign essential tremor 01/03/2018   Pain in joint of right ankle 05/17/2017   Foot pain 05/17/2017   Peroneal tendinitis 05/17/2017   Depression with anxiety 01/03/2017   Insomnia 05/05/2016   Coronary artery calcification seen on CAT scan    DOE (dyspnea on exertion)    Fatigue 02/27/2015   Elevated homocysteine 09/26/2014   Multiple myeloma ([place]) 09/26/2014   Acquired pes planus of right foot 07/02/2014   Posterior tibial tendon dysfunction 07/02/2014   Encounter for antineoplastic chemotherapy 01/02/2014   Kahler disease ([place]) 01/02/2014   Avitaminosis D 01/02/2014   Amyloidosis ([place]) 03/26/2013   Enlarged lymph node 02/14/2013   Pure hypercholesterolemia 03/29/2011   Memory disorder 09/21/2010   Labile  hypertension 09/21/2010   Past Medical History:  Diagnosis Date   Amyloidosis ([place])    Diabetes mellitus without complication ([place])    GERD (gastroesophageal reflux disease)    Hearing loss    Has hearing aids   History of kidney stones    Hyperlipidemia    Multiple myeloma ([facility])    and amylodosis    Nephrolithiasis    Occasional tremors    Pneumonia    Prostate cancer ([place])    Prostatitis    acute and chronic   Skin cancer     Family History  Problem Relation Age of Onset   Pancreatic cancer Father    Prostate cancer Father    Parkinsonism Mother    CAD Brother 60   Cancer Paternal Grandmother  Breast cancer Neg Hx    Colon cancer Neg Hx     Past Surgical History:  Procedure Laterality Date   APPENDECTOMY     CARDIAC CATHETERIZATION N/A 06/13/2015   Procedure: Left Heart Cath and Coronary Angiography;  Surgeon: Jettie Booze, MD;  Location: [facility];  Service: Cardiovascular;  Laterality: N/A;   COLONOSCOPY  04/07/2000   ELBOW SURGERY  03/15/2001   right   ESOPHAGOGASTRODUODENOSCOPY (EGD) WITH PROPOFOL N/A 09/07/2021   Procedure: ESOPHAGOGASTRODUODENOSCOPY (EGD) WITH PROPOFOL;  Surgeon: Irene Shipper, MD;  Location: [facility];  Service: Gastroenterology;  Laterality: N/A;   EXTRACORPOREAL SHOCK WAVE LITHOTRIPSY Left 07/17/2018   Procedure: EXTRACORPOREAL SHOCK WAVE LITHOTRIPSY (ESWL);  Surgeon: [name], MD;  [facility];  Service: Urology;  Laterality: Left;   EXTRACORPOREAL SHOCK WAVE LITHOTRIPSY Right 12/08/2020   Procedure: RIGHT EXTRACORPOREAL SHOCK WAVE LITHOTRIPSY (ESWL);  Surgeon: Raynelle Bring, MD;  Location: [facility];  Service: Urology;  Laterality: Right;   FOOT ARTHRODESIS Right 04/15/2021   Procedure: RIGHT SUBTALAR AND TALONAVICULAR FUSION;  Surgeon: Newt Minion, MD;  Location: [facility];  Service: Orthopedics;  Laterality: Right;   HERNIA REPAIR     IR MECH REMOV OBSTRUC MAT ANY COLON TUBE W/FLUORO   08/27/2021   IR [place] GASTRO/COLONIC TUBE PERCUT W/FLUORO  09/08/2021   ROTATOR CUFF REPAIR     WHIPPLE PROCEDURE  07/13/2021   Social History   Occupational History   Occupation: retired  Tobacco Use   Smoking status: Former    Packs/day: 3.00    Years: 20.00    Total pack years: 60.00    Types: Cigarettes    Quit date: 03/15/1977    Years since quitting: 44.7   Smokeless tobacco: Never   Tobacco comments:    quit 35 years ago  Vaping Use   Vaping Use: Never used  Substance and Sexual Activity   Alcohol use: Yes    Alcohol/week: 3.0 standard drinks of alcohol    Types: 3 Glasses of wine per week    Comment: approx 1 drink/night   Drug use: No   Sexual activity: Not Currently    Birth control/protection: None

## 2021-12-14 NOTE — Telephone Encounter (Signed)
Called wife back and she was trying to get with Mickel Baas (nutritionist) to get her appointment scheduled. I gave number for Mickel Baas per Laura's message. Patient will call and reschedule.

## 2021-12-14 NOTE — Telephone Encounter (Signed)
Patient's wife called and stated that patient can't get his sugar stabilized. Needed someone to call her as soon as possible.

## 2021-12-17 ENCOUNTER — Encounter: Payer: Medicare Other | Attending: Endocrinology | Admitting: Dietician

## 2021-12-17 ENCOUNTER — Encounter: Payer: Medicare Other | Admitting: Dietician

## 2021-12-17 DIAGNOSIS — Z794 Long term (current) use of insulin: Secondary | ICD-10-CM | POA: Insufficient documentation

## 2021-12-17 DIAGNOSIS — E119 Type 2 diabetes mellitus without complications: Secondary | ICD-10-CM | POA: Insufficient documentation

## 2021-12-17 NOTE — Progress Notes (Signed)
Diabetes Self-Management Education  Visit Type: First/Initial  Appt. Start Time: 1500 Appt. End Time: 3662  12/25/2021  Mr. William Barron, identified by name and date of birth, is a 76 y.o. male with a diagnosis of Diabetes: Type 2 (pre Whipple and now insulin dependent).   ASSESSMENT Patient is here today with his wife.  He was last seen by a diabetes educator in our office 12/08/2021.   He would like to learn more about nutrition to help with blood glucose and maintain a healthy weight. William Barron shows 73% Time in range, 8% low, 2% very low, 12% high and 5% very high Discussed putting the [employer] on his wife's phone  History includes multiple myeloma, amyloidosis, history of diabetes and worsening insulin deficiency since whipple surgery this summer for cysts on pancrease (benign). Labs noted to include A1C 7.4% 11/13/2021 and 7.8% 04/10/2021 Medications include Tresiba 8 units daily, mealtime insuline 4 units for a small meal, 6 units for a medium meal, and 8 units for a large meal, megace, creon, lasix CGM:  Free Style LIbre 2  140 lbs 11/20/2021 125 lbs 10/21/2021 113 lbs 09/07/2021 UBW 140 lbs and lost to 109 lbs after the Whipple (was on a feeding tube at that time)      Diabetes Self-Management Education - 12/25/21 1018       Visit Information   Visit Type First/Initial      Initial Visit   Diabetes Type Type 2   pre Whipple and now insulin dependent   Are you taking your medications as prescribed? Yes      Psychosocial Assessment   What is the hardest part about your diabetes right now, causing you the most concern, or is the most worrisome to you about your diabetes?   Taking/obtaining medications    Self-care barriers Debilitated state due to current medical condition    Self-management support Doctor's office;Family    Other persons present Patient    Patient Concerns Nutrition/Meal planning    Special Needs None    Preferred Learning Style No preference  indicated    Learning Readiness Ready      Pre-Education Assessment   Patient understands the diabetes disease and treatment process. Needs Review    Patient understands incorporating nutritional management into lifestyle. Needs Review    Patient undertands incorporating physical activity into lifestyle. Needs Review    Patient understands using medications safely. Needs Review    Patient understands monitoring blood glucose, interpreting and using results Needs Review    Patient understands prevention, detection, and treatment of acute complications. Needs Review    Patient understands prevention, detection, and treatment of chronic complications. Needs Review    Patient understands how to develop strategies to address psychosocial issues. Needs Review    Patient understands how to develop strategies to promote health/change behavior. Needs Review      Complications   Last HgB A1C per patient/outside source 7.4 %    How often do you check your blood sugar? > 4 times/day    Fasting Blood glucose range (mg/dL) 70-129;130-179    Postprandial Blood glucose range (mg/dL) 130-179;180-200;70-129    Number of hypoglycemic episodes per month 4    Can you tell when your blood sugar is low? Yes    What do you do if your blood sugar is low? drink/eat    Number of hyperglycemic episodes ( >262m/dL): Daily      Dietary Intake   Breakfast 2 eggs, sausage or ham,  1/2 english muffin, occasional grits or home fries, tomato juice    Lunch vegetable soup, crackers, cheese    Snack (afternoon) if blood glucose is less than 120 - oatmeal cookie, juice shop smoothie, cook out milkshake    Dinner [employer] - meat, beans, salad with blue cheese dressing    Snack (evening) if blood glucose is less than 120 - something crunchy/salty    Beverage(s) water, unsweetend tea with small amout of sweet (1:10), rare diet soda, 2 beer per day      Activity / Exercise   Activity / Exercise Type ADL's      Patient  Education   Previous Diabetes Education Yes (please comment)   11/2021   Disease Pathophysiology Definition of diabetes, type 1 and 2, and the diagnosis of diabetes;Explored patient's options for treatment of their diabetes   chronic insulin need   Healthy Eating Role of diet in the treatment of diabetes and the relationship between the three main macronutrients and blood glucose level;Meal options for control of blood glucose level and chronic complications.;Information on hints to eating out and maintain blood glucose control.    Being Active Role of exercise on diabetes management, blood pressure control and cardiac health.    Medications Reviewed patients medication for diabetes, action, purpose, timing of dose and side effects.    Monitoring Taught/evaluated CGM (comment)    Acute complications Taught prevention, symptoms, and  treatment of hypoglycemia - the 15 rule.      Individualized Goals (developed by patient)   Nutrition General guidelines for healthy choices and portions discussed    Physical Activity Not Applicable    Medications take my medication as prescribed    Monitoring  Consistenly use CGM    Problem Solving Eating Pattern      Post-Education Assessment   Patient understands the diabetes disease and treatment process. Comprehends key points    Patient understands incorporating nutritional management into lifestyle. Comprehends key points    Patient undertands incorporating physical activity into lifestyle. Comprehends key points    Patient understands using medications safely. Comphrehends key points    Patient understands monitoring blood glucose, interpreting and using results Comprehends key points    Patient understands prevention, detection, and treatment of acute complications. Comprehends key points    Patient understands prevention, detection, and treatment of chronic complications. Comprehends key points    Patient understands how to develop strategies to address  psychosocial issues. Comprehends key points    Patient understands how to develop strategies to promote health/change behavior. Comprehends key points      Outcomes   Expected Outcomes Demonstrated interest in learning but significant barriers to change    Future DMSE 2 months    Program Status Not Completed             Individualized Plan for Diabetes Self-Management Training:   Learning Objective:  Patient will have a greater understanding of diabetes self-management. Patient education plan is to attend individual and/or group sessions per assessed needs and concerns.   Plan:   Patient Instructions  [place] Lathe up app  Be sure you are eating a meal or snack when you have a beer. Keep your higher carb foods with your meals rather than your snacks Your blood glucose is always showing on your phone.  If your symptoms don't match the number do a finger stick to check your blood glucose.  Treat any low lower than 70.  Set the [place] LInk app to alarm at  a higher rate such as 90 to help prevent a low to begin with.  Adjust the insulin amount based on the amount of carbs with the meal.  30 grams of carbs is a small meal  45 grams is a medium meal  60 or more carbs is a large meal  Expected Outcomes:  Demonstrated interest in learning but significant barriers to change  Education material provided: ADA - How to Thrive: A Guide for Your Journey with Diabetes, Meal plan card, Snack sheet, and Diabetes Resources  If problems or questions, patient to contact team via:  Phone  Future DSME appointment: 2 months

## 2021-12-17 NOTE — Patient Instructions (Addendum)
[employer] Link up app  Be sure you are eating a meal or snack when you have a beer. Keep your higher carb foods with your meals rather than your snacks Your blood glucose is always showing on your phone.  If your symptoms don't match the number do a finger stick to check your blood glucose.  Treat any low lower than 70.  Set the [place] app to alarm at a higher rate such as 90 to help prevent a low to begin with.  Adjust the insulin amount based on the amount of carbs with the meal.  30 grams of carbs is a small meal  45 grams is a medium meal  60 or more carbs is a large meal

## 2021-12-22 ENCOUNTER — Telehealth: Payer: Self-pay

## 2021-12-22 NOTE — Telephone Encounter (Signed)
[facility] requested documentation for the last 6 months. Faxed last chart notes to [phone].

## 2021-12-25 ENCOUNTER — Encounter: Payer: Self-pay | Admitting: Dietician

## 2022-01-01 ENCOUNTER — Telehealth: Payer: Self-pay | Admitting: Internal Medicine

## 2022-01-01 NOTE — Telephone Encounter (Signed)
Pt requesting sooner appointment: Pt scheduled for 01/14/2022 at 1:50 PM to see Dr. Carlean Purl: Pt Made aware Pt verbalized understanding with all questions answered.

## 2022-01-01 NOTE — Telephone Encounter (Signed)
Reached out to patient to advise him that we needed to reschedule his appointment for 11/14 at 10:10 with Dr. Carlean Purl due to Dr. Carlean Purl needing to be off that day. I advised patient that the next available we had would be 12/19 at 10:50. I gave patient the appointment and he stated that was too far away. Patient is requesting a call back to discuss if he can be seen sooner. Please advise.

## 2022-01-04 ENCOUNTER — Ambulatory Visit (INDEPENDENT_AMBULATORY_CARE_PROVIDER_SITE_OTHER): Payer: Medicare Other

## 2022-01-04 ENCOUNTER — Ambulatory Visit (INDEPENDENT_AMBULATORY_CARE_PROVIDER_SITE_OTHER): Payer: Medicare Other | Admitting: Podiatry

## 2022-01-04 DIAGNOSIS — M21371 Foot drop, right foot: Secondary | ICD-10-CM

## 2022-01-04 DIAGNOSIS — M779 Enthesopathy, unspecified: Secondary | ICD-10-CM

## 2022-01-04 DIAGNOSIS — M7752 Other enthesopathy of left foot: Secondary | ICD-10-CM

## 2022-01-04 NOTE — Progress Notes (Addendum)
Chief Complaint  Patient presents with   drop foot right foot    Patient is here for possible drop foot in the right foot.    HPI: 76 y.o. male PMHx diabetes mellitus presenting today with his wife for evaluation of loss of strength to the right lower extremity.  Patient has a history of double arthrodesis to the right foot performed here in [facility] by Dr. Sharol Given.  Following the surgery he ended up at [facility] where a Whipple procedure was performed followed by long inpatient rehab.  He has now been home for about 2 months and is having problems with his right lower extremity.  He was prescribed an AFO brace that he wears daily.  He has been diagnosed with dropfoot.  Presenting for further treatment and evaluation  Past Medical History:  Diagnosis Date   Amyloidosis ([place])    Diabetes mellitus without complication ([place])    GERD (gastroesophageal reflux disease)    Hearing loss    Has hearing aids   History of kidney stones    Hyperlipidemia    Multiple myeloma ([facility])    and amylodosis    Nephrolithiasis    Occasional tremors    Pneumonia    Prostate cancer ([place])    Prostatitis    acute and chronic   Skin cancer     Past Surgical History:  Procedure Laterality Date   APPENDECTOMY     CARDIAC CATHETERIZATION N/A 06/13/2015   Procedure: Left Heart Cath and Coronary Angiography;  Surgeon: Jettie Booze, MD;  Location: [facility];  Service: Cardiovascular;  Laterality: N/A;   COLONOSCOPY  04/07/2000   ELBOW SURGERY  03/15/2001   right   ESOPHAGOGASTRODUODENOSCOPY (EGD) WITH PROPOFOL N/A 09/07/2021   Procedure: ESOPHAGOGASTRODUODENOSCOPY (EGD) WITH PROPOFOL;  Surgeon: Irene Shipper, MD;  Location: [facility];  Service: Gastroenterology;  Laterality: N/A;   EXTRACORPOREAL SHOCK WAVE LITHOTRIPSY Left 07/17/2018   Procedure: EXTRACORPOREAL SHOCK WAVE LITHOTRIPSY (ESWL);  Surgeon: [name], MD;  Location: [facility];  Service: Urology;  Laterality: Left;    EXTRACORPOREAL SHOCK WAVE LITHOTRIPSY Right 12/08/2020   Procedure: RIGHT EXTRACORPOREAL SHOCK WAVE LITHOTRIPSY (ESWL);  Surgeon: Raynelle Bring, MD;  Location: [facility];  Service: Urology;  Laterality: Right;   FOOT ARTHRODESIS Right 04/15/2021   Procedure: RIGHT SUBTALAR AND TALONAVICULAR FUSION;  Surgeon: Newt Minion, MD;  Location: [facility];  Service: Orthopedics;  Laterality: Right;   HERNIA REPAIR     IR [facility] REMOV OBSTRUC MAT ANY COLON TUBE W/FLUORO  08/27/2021   IR [place] GASTRO/COLONIC TUBE PERCUT W/FLUORO  09/08/2021   ROTATOR CUFF REPAIR     WHIPPLE PROCEDURE  07/13/2021    No Known Allergies   Physical Exam: General: The patient is alert and oriented x3 in no acute distress.  Dermatology: Skin is warm, dry and supple bilateral lower extremities. Negative for open lesions or macerations.  Incisions healed.  Vascular: Palpable pedal pulses bilaterally. Capillary refill within normal limits.  Chronic lateral lower extremity edema noted.  Neurological: Light touch and protective threshold grossly intact  Musculoskeletal Exam: Loss of ankle joint eversion.  Only able to evert the foot against light resistance.  Muscle strength 5/5 LLE.  Radiographic Exam RT foot 01/04/2022:  Orthopedic staple across the TN joint which appears stable and intact.  Two orthopedic screws traversing the subtalar joint which again appears stable and intact.  Routine healing of the arthrodesis sites noted.  No acute fracture.  No nonunion.  No loosening of the hardware  Assessment: 1. PSxHx double arthrodesis RT foot. Feb 2023. Dr. Sharol Given 2.  Possible iatrogenic dropfoot RLE 3. H/o recent whipple procedure 07/13/2021 w/ inpatient rehab. Patient has been home now for only about 2 months   Plan of Care:  1. Patient evaluated. X-Rays reviewed.  2.  Order placed for nerve conduction study RLE 3.  Continue AFO brace RLE in the meantime 4.  Patient has completed outpatient physical therapy.   He states that it was no benefit for his symptoms. 5.  If nerve conduction study is abnormal we will refer to neurology. Will discuss results over the phone 6.  Appointment for diabetic shoes with insoles  7.  Return to clinic as needed        Edrick Kins, DPM [facility]  Dr. Edrick Kins, DPM    [address], [place] [address]                Office [phone]  Fax [phone]

## 2022-01-05 ENCOUNTER — Ambulatory Visit (INDEPENDENT_AMBULATORY_CARE_PROVIDER_SITE_OTHER): Payer: Medicare Other | Admitting: Endocrinology

## 2022-01-05 ENCOUNTER — Encounter: Payer: Self-pay | Admitting: Endocrinology

## 2022-01-05 VITALS — BP 132/76 | HR 76 | Ht 65.0 in | Wt 149.8 lb

## 2022-01-05 DIAGNOSIS — E1165 Type 2 diabetes mellitus with hyperglycemia: Secondary | ICD-10-CM | POA: Diagnosis not present

## 2022-01-05 DIAGNOSIS — R931 Abnormal findings on diagnostic imaging of heart and coronary circulation: Secondary | ICD-10-CM

## 2022-01-05 DIAGNOSIS — Z794 Long term (current) use of insulin: Secondary | ICD-10-CM

## 2022-01-05 NOTE — Progress Notes (Unsigned)
Patient ID: William Barron, male   DOB: 1945/06/23, 76 y.o.   MRN: [id number]           Reason for Appointment: for Type 2 Diabetes  Referring PCP: [name]   History of Present Illness:          Date of diagnosis of type 2 diabetes mellitus: 2020?       Background history:   He was apparently told when he was getting chemotherapy with steroids at the [facility] oncology center for his multiple myeloma that he has developed diabetes about 2 to 3 years ago but details are not available However blood sugars are likely as much as 400 and he was taking insulin but mostly correction doses , Outside records appears that highest recent A1c was 9% as of 8/22  Recent history:   Most recent A1c is  7.4  INSULIN regimen is: [place] 8 units in am      6-8 units at bedtime, NovoLog 4-8 units before meals based on blood sugars  Non-insulin hypoglycemic drugs the patient is taking are: None  Current management, blood sugar patterns and problems identified: His diabetes has been managed apparently by his PCP and also at times by his specialists at [facility] Mostly has been taking Humalog or NovoLog correction doses at mealtimes but more recently was also given regular insulin 8 units to take at bedtime  Recently with adding the regular insulin in the morning sugars are not consistently very high but he is not only taking correction doses the rest of the day His blood sugars are averaging in the mid to high 200s the rest of the day fairly consistently Previously a couple of months ago because of his weight loss after surgery he was trying to eat without any limitations or restricting any choices including desserts but now he thinks he is starting to cut back on sweets, may occasionally have regular soft drink He was previously given a freestyle libre sensor version 2 but he has not started this and does not know how to use it Currently checking blood sugars about 3-4 times a day on average with  information mostly available for the last week  He does tend to have more thirst and gets up at night 4 times to urinate Recently highest blood sugar was 373 in the afternoon  Usually eating 3 meals a day bf 8-9  Because of his foot drop he is not exercising, using a walker        Glucose monitoring:  done  times a day         Glucometer: One Touch .       Blood Glucose readings by time of day and averages from meter download:  .hmg  PREMEAL Breakfast Lunch Dinner Bedtime  Overall   Glucose range:  239-262 169-335 221-341 70-373  Median: 130  256  220   Dietician visit, most recent: None   Weight history:  Wt Readings from Last 3 Encounters:  11/20/21 140 lb (63.5 kg)  11/17/21 138 lb 3.2 oz (62.7 kg)  10/21/21 125 lb 3.2 oz (56.8 kg)    Glycemic control:   Lab Results  Component Value Date   HGBA1C 7.4 (H) 11/13/2021   HGBA1C 7.8 (H) 04/10/2021   Lab Results  Component Value Date   MICROALBUR 3.8 (H) 11/13/2021   LDLCALC 126 (H) 03/29/2011   CREATININE 1.06 12/07/2021   Lab Results  Component Value Date   MICRALBCREAT 5.8 11/13/2021  No results found for: "FRUCTOSAMINE"  Lab Results  Component Value Date   HGB 8.2 (L) 12/07/2021     No visits with results within 1 Week(s) from this visit.  Latest known visit with results is:  Admission on 12/07/2021, Discharged on 12/07/2021  Component Date Value Ref Range Status   WBC 12/07/2021 7.6  4.0 - 10.5 K/uL Final   RBC 12/07/2021 3.29 (L)  4.22 - 5.81 MIL/uL Final   Hemoglobin 12/07/2021 8.2 (L)  13.0 - 17.0 g/dL Final   HCT 12/07/2021 27.4 (L)  39.0 - 52.0 % Final   MCV 12/07/2021 83.3  80.0 - 100.0 fL Final   MCH 12/07/2021 24.9 (L)  26.0 - 34.0 pg Final   MCHC 12/07/2021 29.9 (L)  30.0 - 36.0 g/dL Final   RDW 12/07/2021 19.8 (H)  11.5 - 15.5 % Final   Platelets 12/07/2021 407 (H)  150 - 400 K/uL Final   nRBC 12/07/2021 0.0  0.0 - 0.2 % Final   Neutrophils Relative % 12/07/2021 74  % Final   Neutro  Abs 12/07/2021 5.7  1.7 - 7.7 K/uL Final   Lymphocytes Relative 12/07/2021 13  % Final   Lymphs Abs 12/07/2021 1.0  0.7 - 4.0 K/uL Final   Monocytes Relative 12/07/2021 8  % Final   Monocytes Absolute 12/07/2021 0.6  0.1 - 1.0 K/uL Final   Eosinophils Relative 12/07/2021 3  % Final   Eosinophils Absolute 12/07/2021 0.2  0.0 - 0.5 K/uL Final   Basophils Relative 12/07/2021 1  % Final   Basophils Absolute 12/07/2021 0.1  0.0 - 0.1 K/uL Final   Immature Granulocytes 12/07/2021 1  % Final   Abs Immature Granulocytes 12/07/2021 0.05  0.00 - 0.07 K/uL Final   Performed at [facility], [place], [place] [address]   Sodium 12/07/2021 141  135 - 145 mmol/L Final   Potassium 12/07/2021 3.4 (L)  3.5 - 5.1 mmol/L Final   Chloride 12/07/2021 108  98 - 111 mmol/L Final   CO2 12/07/2021 25  22 - 32 mmol/L Final   Glucose, Bld 12/07/2021 60 (L)  70 - 99 mg/dL Final   Glucose reference range applies only to samples taken after fasting for at least 8 hours.   BUN 12/07/2021 12  8 - 23 mg/dL Final   Creatinine, Ser 12/07/2021 1.06  0.61 - 1.24 mg/dL Final   Calcium 12/07/2021 8.4 (L)  8.9 - 10.3 mg/dL Final   GFR, Estimated 12/07/2021 >60  >60 mL/min Final   Comment: (NOTE) Calculated using the CKD-EPI Creatinine Equation (2021)    Anion gap 12/07/2021 8  5 - 15 Final   Performed at [facility], [address], [place], [place] [address]   Glucose-Capillary 12/07/2021 52 (L)  70 - 99 mg/dL Final   Glucose reference range applies only to samples taken after fasting for at least 8 hours.   Glucose-Capillary 12/07/2021 63 (L)  70 - 99 mg/dL Final   Glucose reference range applies only to samples taken after fasting for at least 8 hours.   Glucose-Capillary 12/07/2021 107 (H)  70 - 99 mg/dL Final   Glucose reference range applies only to samples taken after fasting for at least 8 hours.    Allergies as of 01/05/2022   No Known Allergies       Medication List        Accurate as of January 05, 2022  9:08 AM. If you have any questions, ask your  nurse or doctor.          Creon 36000 UNITS Cpep capsule Generic drug: lipase/protease/amylase Take by mouth.   FreeStyle [place] 2 Reader [employer] Use as instructed to check blood sugars   FreeStyle Libre 2 Sensor Misc Change every 14 days   furosemide 20 MG tablet Commonly known as: LASIX Take 1 tablet (20 mg total) by mouth daily. For 3 days, then as needed.   megestrol 400 MG/10ML suspension Commonly known as: MEGACE Place 10 mLs (400 mg total) into feeding tube 2 (two) times daily.   melatonin 3 MG Tabs tablet Place 2 tablets (6 mg total) into feeding tube at bedtime as needed. What changed: reasons to take this   NovoLOG FlexPen 100 UNIT/ML FlexPen Generic drug: insulin aspart Inject 0-10 Units into the skin 4 (four) times daily. Per sliding scale: CBG     0-200: 0 units CBG 201-250: 2 units CBG 251-300: 4 units CBG 301-350: 6 units CBG 351-400: 8 units CBG   >   400: 10 units   pantoprazole sodium 40 mg Commonly known as: PROTONIX Place 40 mg into feeding tube daily. What changed: additional instructions   Testosterone 20.25 MG/ACT (1.62%) Gel Apply 4 Pump topically daily.   Tyler Aas FlexTouch 100 UNIT/ML FlexTouch Pen Generic drug: insulin degludec Inject 15 Units into the skin daily.        Allergies: No Known Allergies  Past Medical History:  Diagnosis Date   Amyloidosis ([place])    Diabetes mellitus without complication (HCC)    GERD (gastroesophageal reflux disease)    Hearing loss    Has hearing aids   History of kidney stones    Hyperlipidemia    Multiple myeloma (HCC)    and amylodosis    Nephrolithiasis    Occasional tremors    Pneumonia    Prostate cancer ([place])    Prostatitis    acute and chronic   Skin cancer     Past Surgical History:  Procedure Laterality Date   APPENDECTOMY     CARDIAC CATHETERIZATION N/A 06/13/2015    Procedure: Left Heart Cath and Coronary Angiography;  Surgeon: Jettie Booze, MD;  Location: [facility];  Service: Cardiovascular;  Laterality: N/A;   COLONOSCOPY  04/07/2000   ELBOW SURGERY  03/15/2001   right   ESOPHAGOGASTRODUODENOSCOPY (EGD) WITH PROPOFOL N/A 09/07/2021   Procedure: ESOPHAGOGASTRODUODENOSCOPY (EGD) WITH PROPOFOL;  Surgeon: Irene Shipper, MD;  Location: [place];  Service: Gastroenterology;  Laterality: N/A;   EXTRACORPOREAL SHOCK WAVE LITHOTRIPSY Left 07/17/2018   Procedure: EXTRACORPOREAL SHOCK WAVE LITHOTRIPSY (ESWL);  Surgeon: [name], MD;  Location: [facility];  Service: Urology;  Laterality: Left;   EXTRACORPOREAL SHOCK WAVE LITHOTRIPSY Right 12/08/2020   Procedure: RIGHT EXTRACORPOREAL SHOCK WAVE LITHOTRIPSY (ESWL);  Surgeon: Raynelle Bring, MD;  Location: [facility];  Service: Urology;  Laterality: Right;   FOOT ARTHRODESIS Right 04/15/2021   Procedure: RIGHT SUBTALAR AND TALONAVICULAR FUSION;  Surgeon: Newt Minion, MD;  Location: [place];  Service: Orthopedics;  Laterality: Right;   HERNIA REPAIR     IR MECH REMOV OBSTRUC MAT ANY COLON TUBE W/FLUORO  08/27/2021   IR REPLC GASTRO/COLONIC TUBE PERCUT W/FLUORO  09/08/2021   ROTATOR CUFF REPAIR     WHIPPLE PROCEDURE  07/13/2021    Family History  Problem Relation Age of Onset   Pancreatic cancer Father    Prostate cancer Father    Parkinsonism Mother    CAD Brother 67  Cancer Paternal Grandmother    Breast cancer Neg Hx    Colon cancer Neg Hx     Social History:  reports that he quit smoking about 44 years ago. His smoking use included cigarettes. He has a 60.00 pack-year smoking history. He has never used smokeless tobacco. He reports current alcohol use of about 3.0 standard drinks of alcohol per week. He reports that he does not use drugs.   Review of Systems   Lipid history: No significant hyperlipidemia, last available LDL is 99 as of 9/22    Lab Results  Component  Value Date   CHOL 189 03/29/2011   HDL 45.30 03/29/2011   LDLCALC 126 (H) 03/29/2011   TRIG 129 09/10/2021   CHOLHDL 4 03/29/2011           Hypertension: Has not been present  BP Readings from Last 3 Encounters:  12/07/21 (!) 155/77  11/20/21 130/60  11/17/21 122/64    Most recent eye exam was in?  Most recent foot exam: 9/23  Currently known complications of diabetes: Foot drop  He is taking Creon for pancreatic insufficiency  HYPOGONADISM: He has been treated by his urologist for over 10 years with testosterone supplementation, apparently his testosterone level previously was significantly low but he does not know the reason for this, no history of testicular disease or injury No recent labs available  LABS:  No visits with results within 1 Week(s) from this visit.  Latest known visit with results is:  Admission on 12/07/2021, Discharged on 12/07/2021  Component Date Value Ref Range Status   WBC 12/07/2021 7.6  4.0 - 10.5 K/uL Final   RBC 12/07/2021 3.29 (L)  4.22 - 5.81 MIL/uL Final   Hemoglobin 12/07/2021 8.2 (L)  13.0 - 17.0 g/dL Final   HCT 12/07/2021 27.4 (L)  39.0 - 52.0 % Final   MCV 12/07/2021 83.3  80.0 - 100.0 fL Final   MCH 12/07/2021 24.9 (L)  26.0 - 34.0 pg Final   MCHC 12/07/2021 29.9 (L)  30.0 - 36.0 g/dL Final   RDW 12/07/2021 19.8 (H)  11.5 - 15.5 % Final   Platelets 12/07/2021 407 (H)  150 - 400 K/uL Final   nRBC 12/07/2021 0.0  0.0 - 0.2 % Final   Neutrophils Relative % 12/07/2021 74  % Final   Neutro Abs 12/07/2021 5.7  1.7 - 7.7 K/uL Final   Lymphocytes Relative 12/07/2021 13  % Final   Lymphs Abs 12/07/2021 1.0  0.7 - 4.0 K/uL Final   Monocytes Relative 12/07/2021 8  % Final   Monocytes Absolute 12/07/2021 0.6  0.1 - 1.0 K/uL Final   Eosinophils Relative 12/07/2021 3  % Final   Eosinophils Absolute 12/07/2021 0.2  0.0 - 0.5 K/uL Final   Basophils Relative 12/07/2021 1  % Final   Basophils Absolute 12/07/2021 0.1  0.0 - 0.1 K/uL Final    Immature Granulocytes 12/07/2021 1  % Final   Abs Immature Granulocytes 12/07/2021 0.05  0.00 - 0.07 K/uL Final   Performed at [facility], [place], [place] [address]   Sodium 12/07/2021 141  135 - 145 mmol/L Final   Potassium 12/07/2021 3.4 (L)  3.5 - 5.1 mmol/L Final   Chloride 12/07/2021 108  98 - 111 mmol/L Final   CO2 12/07/2021 25  22 - 32 mmol/L Final   Glucose, Bld 12/07/2021 60 (L)  70 - 99 mg/dL Final   Glucose reference range applies only to samples taken after fasting  for at least 8 hours.   BUN 12/07/2021 12  8 - 23 mg/dL Final   Creatinine, Ser 12/07/2021 1.06  0.61 - 1.24 mg/dL Final   Calcium 12/07/2021 8.4 (L)  8.9 - 10.3 mg/dL Final   GFR, Estimated 12/07/2021 >60  >60 mL/min Final   Comment: (NOTE) Calculated using the CKD-EPI Creatinine Equation (2021)    Anion gap 12/07/2021 8  5 - 15 Final   Performed at [facility], [address] [place], [place] [address]   Glucose-Capillary 12/07/2021 52 (L)  70 - 99 mg/dL Final   Glucose reference range applies only to samples taken after fasting for at least 8 hours.   Glucose-Capillary 12/07/2021 63 (L)  70 - 99 mg/dL Final   Glucose reference range applies only to samples taken after fasting for at least 8 hours.   Glucose-Capillary 12/07/2021 107 (H)  70 - 99 mg/dL Final   Glucose reference range applies only to samples taken after fasting for at least 8 hours.    Physical Examination:  There were no vitals taken for this visit.  ASSESSMENT:  Diabetes type 2 on insulin  See history of present illness for detailed discussion of current diabetes management, blood sugar patterns and problems identified  Most recent A1c is 7.4  Currently with his blood sugar monitoring at home is average blood sugar is 220 Hyperglycemia is mostly nonfasting  He likely is insulin deficient because of his partial pancreatectomy and also likely insulin deficient previously by  history Currently has not had any diabetes education and no understanding of insulin action or what insulin does as well as the types of insulin Also because of his various other problems he had lost weight which he appears to be getting back  Current treatment regimen is only as needed Premeal analog insulin with 8 units of regular insulin only at bedtime without any basal insulin   Complications of diabetes: Likely has foot drop related to neuropathy  Other active problems: Pancreatic insufficiency secondary to pancreatectomy, history of myeloma and hyperlordosis, left leg edema, foot drop Hypogonadism managed by urologist, no details available Also needs to have lipids assessed as well as cardiac murmur evaluated by cardiology   PLAN:    Glucose monitoring: Start using freestyle libre sensor once he is able to get the supplies Although he may be able to use the [place] 2 will be better if he can get libre 3 for more continuous monitoring and more accuracy and discussed the differences between the 2 and how this would be used  Patient advised to note blood sugar readings either fasting or 2 hours after meals to help with adjusting his insulin doses as below  Diabetes education: Patient will need considerable amount of diabetes education especially about insulin and detailed information was discussed and subject matter reviewed with him and his wife several times  Lifestyle changes: Dietary changes: Avoid regular soft drinks  Therapy changes: Currently with significant hyperglycemia he will not benefit from other diabetes agents and will focus on getting adequate glycemic control with appropriate basal bolus regimen  Start TRESIBA once a day in the morning with 10 units Discussed in the tail how basal insulin works, timing of injection, dosage, injection sites.   Also discussed titration based on fasting blood sugar every 3 days by 2 units and at target of 90-130 for fasting reading.    Given a flowsheet with instructions on how to keep a record and adjust the doses Insulin sample given  Also discussed in detail the need for mealtime insulin to cover postprandial spikes, action of mealtime insulin, use of the insulin pen, timing and action of the rapid acting insulin as well as starting dose is as follows  He will use 6 units with small meals, 8 units for medium sized meals and 10 units for large meals He will take insulin regardless of Premeal blood sugar If the blood sugar 2 hours later is consistently over 180 he will need to add another 10 units He can be using Humalog, NovoLog or lispro insulin since he has a supply of all of them  Stop the regular insulin at bedtime    Follow-up: 6 weeks   There are no Patient Instructions on file for this visit.    Consultation note has been sent to the referring physician  Elayne Snare 01/05/2022, 9:08 AM   Note: This office note was prepared with Dragon voice recognition system technology. Any transcriptional errors that result from this process are unintentional.

## 2022-01-11 ENCOUNTER — Encounter: Payer: Self-pay | Admitting: Endocrinology

## 2022-01-11 ENCOUNTER — Ambulatory Visit: Payer: Medicare Other | Admitting: Orthopedic Surgery

## 2022-01-12 ENCOUNTER — Telehealth: Payer: Self-pay

## 2022-01-12 ENCOUNTER — Ambulatory Visit: Payer: Medicare Other | Admitting: Orthopedic Surgery

## 2022-01-12 NOTE — Telephone Encounter (Signed)
Wife called in wants to know if insulin can be used if frozen and thawed out. Hotel that they were staying at put in freezer

## 2022-01-14 ENCOUNTER — Encounter: Payer: Self-pay | Admitting: Internal Medicine

## 2022-01-14 ENCOUNTER — Ambulatory Visit (INDEPENDENT_AMBULATORY_CARE_PROVIDER_SITE_OTHER): Payer: Medicare Other | Admitting: Internal Medicine

## 2022-01-14 ENCOUNTER — Other Ambulatory Visit: Payer: Self-pay

## 2022-01-14 VITALS — BP 130/80 | HR 61 | Ht 65.0 in | Wt 148.2 lb

## 2022-01-14 DIAGNOSIS — E8581 Light chain (AL) amyloidosis: Secondary | ICD-10-CM | POA: Diagnosis not present

## 2022-01-14 DIAGNOSIS — M779 Enthesopathy, unspecified: Secondary | ICD-10-CM

## 2022-01-14 DIAGNOSIS — Z90410 Acquired total absence of pancreas: Secondary | ICD-10-CM | POA: Diagnosis not present

## 2022-01-14 DIAGNOSIS — K8689 Other specified diseases of pancreas: Secondary | ICD-10-CM

## 2022-01-14 DIAGNOSIS — R931 Abnormal findings on diagnostic imaging of heart and coronary circulation: Secondary | ICD-10-CM | POA: Diagnosis not present

## 2022-01-14 DIAGNOSIS — Z9049 Acquired absence of other specified parts of digestive tract: Secondary | ICD-10-CM

## 2022-01-14 DIAGNOSIS — M21371 Foot drop, right foot: Secondary | ICD-10-CM

## 2022-01-14 MED ORDER — PANCRELIPASE (LIP-PROT-AMYL) 36000-114000 UNITS PO CPEP: ORAL_CAPSULE | ORAL | 11 refills | Status: DC

## 2022-01-14 MED ORDER — PANTOPRAZOLE SODIUM 40 MG PO PACK: 40.0000 mg | PACK | Freq: Every day | ORAL | Status: DC

## 2022-01-14 NOTE — Patient Instructions (Signed)
Due to recent changes in healthcare laws, you may see the results of your imaging and laboratory studies on MyChart before your provider has had a chance to review them.  We understand that in some cases there may be results that are confusing or concerning to you. Not all laboratory results come back in the same time frame and the provider may be waiting for multiple results in order to interpret others.  Please give [place] 48 hours in order for your provider to thoroughly review all the results before contacting the office for clarification of your results.   We have sent the following medications to your pharmacy for you to pick up at your convenience: Creon  I appreciate the opportunity to care for you. Silvano Rusk, MD, [facility]

## 2022-01-14 NOTE — Progress Notes (Signed)
William Barron 76 y.o. 1945-06-12 [id number]  Assessment & Plan:   Encounter Diagnoses  Name Primary?   Secondary pancreatic insufficiency Yes   H/O Whipple procedure    Light chain (AL) amyloidosis (HCC)    It sounds like he is significantly improved though certainly still debilitated.  He is having some stools that sound like they have excess fat in them so we will increase Creon to 3 with meals and 1 with snacks.  I recommend that he take his Creon throughout the meal at the beginning and during and towards the end to see if that improves treatment of his pancreatic insufficiency.  He will continue pantoprazole also.  He will return in 2 months for reassessment.  He asked if he needed another routine colonoscopy and I told him I do not recommend that.  He had a diminutive adenoma and a diminutive sessile serrated polyp when he was 56 4 years ago and given his age and comorbidities I do not think it makes sense to recommend a routine repeat colonoscopy in his case.  Signs or symptoms are a different story and we would consider repeat colonoscopy if indicated.  CC: [name], MD   Subjective:   Chief Complaint: Pancreatic insufficiency issues after Whipple surgery  HPI 76 year old white man with multiple myeloma and AL amyloidosis status post Whipple surgery in May 2023 for an IPMN suspicion but pathology did not reveal that.  Also has a history of prostate cancer treated with radiation, hypertension, hyperlipidemia and coronary artery disease and type 2 diabetes mellitus.  Followed in heme-onc at [place] last seen 12/31/2021.  His postoperative course after his Whipple procedure was complicated by delayed gastric emptying with a GJ tube and multiple exchanges.  He reports he had a very difficult course and got down to 109 pounds but is back up to 140 pounds.  He was started on Creon and he is taking 2 with meals and 1 with substantial snacks.  He still has some greasy and  oily stools though his weight has returned.  Initially before he was on this current dose of Creon he was having more urgency and some fecal incontinence at times but that has resolved.  He was hospitalized for months, at [facility] and [facility] and spent time in SNF rehab.  He ambulates with a walker and he has a right foot drop.  He has seen orthopedics and now podiatry and EMG study was ordered by the podiatrist.  He reports that the surgeon at [facility] initially said they would observe and not operate but after consultation with another surgeon it was recommended that he have the Whipple procedure.   Labs at [facility] 12/31/2021 with hemoglobin 9.1 platelets 461 white count 6 CMET with glucose 147 chloride 110 calcium 8.6 AST 84 ALT 67 alk phos 888 albumin 3 and total protein 6.1 magnesium 1.6 phosphorus 3.4 and LDH 169 He still has elevated I immunoglobulin free light chain kappa in the blood his IgM and IgG are  low IgA elevated   CT scan in our ED in May soon after his resection with typical postoperative changes and bulky mediastinal and bilateral hilar Appa neuropathy with stippled calcifications unchanged DIAGNOSIS   A.  Gastric nodule, excision (A1 FS): Fibrous nodule. Congo red stain is negative.   B.  Lymph node, hepatic artery, excision: One lymph node involved by amyloidosis (Congo red), negative for tumor (0/1).   C.  Gallbladder, cholecystectomy: Gallbladder involved by amyloidosis.   D.  Pancreas, stomach, duodenum, Whipple: Pancreas, portion of stomach, and duodenum with extensive involvement by amyloidosis.  Focal calcification and ossification in areas of amyloidosis. Benign epithelial lining cyst (3.2 cm), entirely submitted. Thirteen lymph nodes involved by amyloidosis, negative for tumor (0/13). See note.    Note: The cystic structure is lined by partially denuded, flat, biliary-like epithelium, which could be representative of a retention cyst. No ovarian-type stroma is  seen. Immunostains for ER and inhibin performed on blocks D17 and D18 are negative in the cystic stroma. No definitive evidence of intraductal papillary mucinous neoplasms (IPMN) or mucinous cystic neoplasm (MCN) component is identified   EGD 09/07/2021 1. Status post Whipple 2. Status post G-tube/J-tube 3. Repeated proximal migration of feeding J-tube 4. Unable to advance in the J-tube into proper position due to typical technical issues encountered with endoscopic approach.  Colonoscopy 11/08/2017 Ulcerated mucosa in the terminal ileum. Biopsied. - Mucosal ulceration. Biopsied. - Mucosal ulceration. Biopsied. - One diminutive polyp in the descending colon, removed with a cold biopsy forceps. Resected and retrieved. - Diverticulosis in the sigmoid colon. - Internal hemorrhoids. - Anal papilla(e) were hypertrophied. - The examination was otherwise normal on direct and retroflexion views.  1. Surgical [P], terminal ileum - AMYLOID DEPOSITS. - SEE COMMENT. 2. Surgical [P], ileocecal valve, ulcerated - AMYLOID DEPOSITS. - SEE COMMENT. 3. Surgical [P], transverse - AMYLOID DEPOSITS. - SESSILE SERRATED ADENOMA. - SEE COMMENT. 4. Surgical [P], sigmoid, polyp - AMYLOID DEPOSITS. - TUBULAR ADENOMA. - SEE COMMENT.     No Known Allergies Current Meds  Medication Sig   Continuous Blood Gluc Receiver (FREESTYLE LIBRE 2 READER) DEVI Use as instructed to check blood sugars   Continuous Blood Gluc Sensor (FREESTYLE LIBRE 2 SENSOR) MISC Change every 14 days   CREON 36000-114000 units CPEP capsule Take by mouth.   furosemide (LASIX) 20 MG tablet Take 1 tablet (20 mg total) by mouth daily. For 3 days, then as needed.   insulin aspart (NOVOLOG FLEXPEN) 100 UNIT/ML FlexPen Inject 0-10 Units into the skin 4 (four) times daily. Per sliding scale: CBG     0-200: 0 units CBG 201-250: 2 units CBG 251-300: 4 units CBG 301-350: 6 units CBG 351-400: 8 units CBG   >   400: 10 units   insulin  degludec (TRESIBA FLEXTOUCH) 100 UNIT/ML FlexTouch Pen Inject 15 Units into the skin daily.   pantoprazole sodium (PROTONIX) 40 mg Place 40 mg into feeding tube daily.   Testosterone 20.25 MG/ACT (1.62%) GEL Apply 4 Pump topically daily.   Past Medical History:  Diagnosis Date   Amyloidosis ([place])    Diabetes mellitus without complication ([place])    GERD (gastroesophageal reflux disease)    Hearing loss    Has hearing aids   History of kidney stones    Hyperlipidemia    Multiple myeloma (HCC)    and amylodosis    Nephrolithiasis    Occasional tremors    Pneumonia    Prostate cancer ([place])    Prostatitis    acute and chronic   Skin cancer    Past Surgical History:  Procedure Laterality Date   APPENDECTOMY     CARDIAC CATHETERIZATION N/A 06/13/2015   Procedure: Left Heart Cath and Coronary Angiography;  Surgeon: Jettie Booze, MD;  Location: [place] [facility];  Service: Cardiovascular;  Laterality: N/A;   COLONOSCOPY  04/07/2000   ELBOW SURGERY  03/15/2001   right   ESOPHAGOGASTRODUODENOSCOPY (EGD) WITH PROPOFOL N/A 09/07/2021   Procedure: ESOPHAGOGASTRODUODENOSCOPY (EGD)  WITH PROPOFOL;  Surgeon: Irene Shipper, MD;  Location: [place];  Service: Gastroenterology;  Laterality: N/A;   EXTRACORPOREAL SHOCK WAVE LITHOTRIPSY Left 07/17/2018   Procedure: EXTRACORPOREAL SHOCK WAVE LITHOTRIPSY (ESWL);  Surgeon: [name], MD;  Location[facility];  Service: Urology;  Laterality: Left;   EXTRACORPOREAL SHOCK WAVE LITHOTRIPSY Right 12/08/2020   Procedure: RIGHT EXTRACORPOREAL SHOCK WAVE LITHOTRIPSY (ESWL);  Surgeon: Raynelle Bring, MD;  Location: [facility];  Service: Urology;  Laterality: Right;   FOOT ARTHRODESIS Right 04/15/2021   Procedure: RIGHT SUBTALAR AND TALONAVICULAR FUSION;  Surgeon: Newt Minion, MD;  Location: [place];  Service: Orthopedics;  Laterality: Right;   HERNIA REPAIR     IR MECH REMOV OBSTRUC MAT ANY COLON TUBE W/FLUORO  08/27/2021   IR [place]  GASTRO/COLONIC TUBE PERCUT W/FLUORO  09/08/2021   ROTATOR CUFF REPAIR     WHIPPLE PROCEDURE  07/13/2021   Social History   Social History Narrative   Married, 2 children   Retired criminal Counsellor and [place]   1 drink/day   family history includes CAD (age of onset: 24) in his brother; Cancer in his paternal grandmother; Pancreatic cancer in his father; Parkinsonism in his mother; Prostate cancer in his father.   Review of Systems See HPI.  Also it has been difficult to except the outcome of the surgery and the surprise that he did not have IPMN as originally suspected.  Objective:   Physical Exam BP 130/80   Pulse 61   Ht _0  (1.651 m)   Wt 148 lb 4 oz (67.2 kg)   BMI 24.67 kg/m  Elderly wm using walker, somewhat chronically ill Lungs clear Cor s1s2 harsh SEM left sternal border Abd soft, surgical scars, no mass and nontender Alert and oriented x 3

## 2022-01-20 DIAGNOSIS — I350 Nonrheumatic aortic (valve) stenosis: Secondary | ICD-10-CM | POA: Insufficient documentation

## 2022-01-20 DIAGNOSIS — M7989 Other specified soft tissue disorders: Secondary | ICD-10-CM | POA: Insufficient documentation

## 2022-01-20 NOTE — Progress Notes (Deleted)
Cardiology Office Note   Date:  01/20/2022   ID:  William Barron, DOB 11-09-1945, MRN [id number]  PCP:  [name], MD  Cardiologist:   Minus Breeding, MD   No chief complaint on file.      History of Present Illness: William Barron is a 76 y.o. male who presents for evaluation of coronary calcium.  He had an abnormal POET (Plain Old Exercise Treadmill) and significant calcium in his coronaries.  However, cath demonstrated mild CAD. He had a follow up echo last year.   He had some moderate PR but this was unchanged from previous.  He has no pulmonary HTN and normal RV/LV function.  He returns for follow up. Since I last saw him he had a whipple procedure.  Thankfully this was a benign lesion.   He returns for follow up.   At the last visit he had increased leg swelling.  He was found to have moderately elevated pulmonary pressures.  His LV EF was 55%.  ***    ***  It was a long-haul however.  He denies any new cardiovascular symptoms.  He does have lower extremity swelling and he had Dopplers.  There was no evidence of DVT.  He denies any PND or orthopnea.  He had no palpitations, presyncope or syncope.  He had no chest pressure, neck or arm discomfort.  His weight actually went away down with his Whipple but he has now been eating to get this back up.  She has started exercising with a recumbent bike.  In July 2022 he had an echocardiogram  that demonstrated an EF of 50% which is slightly lower than previous.  He has mild aortic regurgitation.  There is mild mitral regurgitation.  He has moderate pulmonary regurgitation.  This by description has not changed from previous.  He did have an MRI.  This demonstrated an EF of 58%.  There was mild to moderate aortic regurgitation but no stenosis.  There was mild to moderate pulmonary regurgitation.  There was some delayed enhancement and mildly in the basal LV and lateral septal walls.  There was no evidence of myocardial  infarction or this was not specifically consistent with amyloid.  There was a small pericardial effusion.   Past Medical History:  Diagnosis Date   Amyloidosis ([place])    Diabetes mellitus without complication ([place])    GERD (gastroesophageal reflux disease)    Hearing loss    Has hearing aids   History of kidney stones    Hyperlipidemia    Multiple myeloma (HCC)    and amylodosis    Nephrolithiasis    Occasional tremors    Pancreatic insufficiency    Pneumonia    Prostate cancer ([place])    Prostatitis    acute and chronic   Skin cancer     Past Surgical History:  Procedure Laterality Date   APPENDECTOMY     CARDIAC CATHETERIZATION N/A 06/13/2015   Procedure: Left Heart Cath and Coronary Angiography;  Surgeon: Jettie Booze, MD;  Location: [place] [facility];  Service: Cardiovascular;  Laterality: N/A;   COLONOSCOPY  04/07/2000   ELBOW SURGERY  03/15/2001   right   ESOPHAGOGASTRODUODENOSCOPY (EGD) WITH PROPOFOL N/A 09/07/2021   Procedure: ESOPHAGOGASTRODUODENOSCOPY (EGD) WITH PROPOFOL;  Surgeon: Irene Shipper, MD;  Location: [place];  Service: Gastroenterology;  Laterality: N/A;   EXTRACORPOREAL SHOCK WAVE LITHOTRIPSY Left 07/17/2018   Procedure: EXTRACORPOREAL SHOCK WAVE LITHOTRIPSY (ESWL);  Surgeon: [name],  MD;  [facility];  Service: Urology;  Laterality: Left;   EXTRACORPOREAL SHOCK WAVE LITHOTRIPSY Right 12/08/2020   Procedure: RIGHT EXTRACORPOREAL SHOCK WAVE LITHOTRIPSY (ESWL);  Surgeon: Raynelle Bring, MD;  Location: [facility];  Service: Urology;  Laterality: Right;   FOOT ARTHRODESIS Right 04/15/2021   Procedure: RIGHT SUBTALAR AND TALONAVICULAR FUSION;  Surgeon: Newt Minion, MD;  Location: [place];  Service: Orthopedics;  Laterality: Right;   HERNIA REPAIR     IR MECH REMOV OBSTRUC MAT ANY COLON TUBE W/FLUORO  08/27/2021   IR [place] GASTRO/COLONIC TUBE PERCUT W/FLUORO  09/08/2021   ROTATOR CUFF REPAIR     WHIPPLE PROCEDURE  07/13/2021      Current Outpatient Medications  Medication Sig Dispense Refill   Continuous Blood Gluc Receiver (FREESTYLE LIBRE 2 READER) DEVI Use as instructed to check blood sugars 1 each 0   Continuous Blood Gluc Sensor (FREESTYLE LIBRE 2 SENSOR) MISC Change every 14 days 2 each 2   furosemide (LASIX) 20 MG tablet Take 1 tablet (20 mg total) by mouth daily. For 3 days, then as needed. 20 tablet 0   insulin aspart (NOVOLOG FLEXPEN) 100 UNIT/ML FlexPen Inject 0-10 Units into the skin 4 (four) times daily. Per sliding scale: CBG     0-200: 0 units CBG 201-250: 2 units CBG 251-300: 4 units CBG 301-350: 6 units CBG 351-400: 8 units CBG   >   400: 10 units     insulin degludec (TRESIBA FLEXTOUCH) 100 UNIT/ML FlexTouch Pen Inject 15 Units into the skin daily. 15 mL 1   lipase/protease/amylase (CREON) 36000 UNITS CPEP capsule Take 3 capsules (108,000 Units total) by mouth 3 (three) times daily with meals. May also take 1 capsule (36,000 Units total) as needed (with snacks). 330 capsule 11   pantoprazole sodium (PROTONIX) 40 mg Take 40 mg by mouth daily. 30 packet    Testosterone 20.25 MG/ACT (1.62%) GEL Apply 4 Pump topically daily.     No current facility-administered medications for this visit.    Allergies:   Patient has no known allergies.    ROS:  Please see the history of present illness.   Otherwise, review of systems are positive for ***.   All other systems are reviewed and negative.    PHYSICAL EXAM: VS:  There were no vitals taken for this visit. , BMI There is no height or weight on file to calculate BMI.  GENERAL:  Well appearing NECK:  No jugular venous distention, waveform within normal limits, carotid upstroke brisk and symmetric, no bruits, no thyromegaly LUNGS:  Clear to auscultation bilaterally CHEST:  Unremarkable HEART:  PMI not displaced or sustained,S1 and S2 within normal limits, no S3, no S4, no clicks, no rubs, *** murmurs ABD:  Flat, positive bowel sounds normal in  frequency in pitch, no bruits, no rebound, no guarding, no midline pulsatile mass, no hepatomegaly, no splenomegaly EXT:  2 plus pulses throughout, no edema, no cyanosis no clubbing    ***GENERAL:  Well appearing NECK:  No jugular venous distention, waveform within normal limits, carotid upstroke brisk and symmetric, no bruits, no thyromegaly LUNGS:  Clear to auscultation bilaterally CHEST:  Unremarkable HEART:  PMI not displaced or sustained,S1 and S2 within normal limits, no S3, no S4, no clicks, no rubs, 3 out of 6 systolic murmur heard best at the mid right sternal border, no diastolic murmurs ABD:  Flat, positive bowel sounds normal in frequency in pitch, no bruits, no rebound, no guarding, no  midline pulsatile mass, no hepatomegaly, no splenomegaly EXT:  2 plus pulses throughout, no edema, no cyanosis no clubbing   EKG:  EKG is *** ordered today. ***   Recent Labs: 08/12/2021: TSH 0.399 09/10/2021: ALT 19; Magnesium 2.1 12/07/2021: BUN 12; Creatinine, Ser 1.06; Hemoglobin 8.2; Platelets 407; Potassium 3.4; Sodium 141      Wt Readings from Last 3 Encounters:  01/14/22 148 lb 4 oz (67.2 kg)  01/05/22 149 lb 12.8 oz (67.9 kg)  11/20/21 140 lb (63.5 kg)      Other studies Reviewed: Additional studies/ records that were reviewed today include:   *** Review of the above records demonstrates:   See above   ASSESSMENT AND PLAN:  ELEVATED CORONARY CALCIUM:    *** He had mild plaque on cath in the past.   He has no new symptoms.  No further testing.   PULMONIC REGURGITATION:    ***   I will follow with an echocardiogram.    AS/AI:   This was mild to moderate.  ***   MM:    ***   He has been treated with daratumumab/dexamethasone.  There was no evidence of amyloidosis involving his heart.  There is a little less than 10% risk of atrial fibrillation.  He is not currently receiving therapy.    EDEMA:   ***  I will check the echo as above and I will give him Lasix 20 mg po daily x  3 days.  He will call me to let me know if his edema has improved.     The following changes have been made:   ***  Labs/ tests ordered today include:    ***  No orders of the defined types were placed in this encounter.   Disposition:   FU with me in *** months.   Signed, Minus Breeding, MD  01/20/2022 9:57 PM    [place]

## 2022-01-22 ENCOUNTER — Ambulatory Visit: Payer: Medicare Other | Admitting: Cardiology

## 2022-01-22 DIAGNOSIS — R931 Abnormal findings on diagnostic imaging of heart and coronary circulation: Secondary | ICD-10-CM

## 2022-01-22 DIAGNOSIS — M7989 Other specified soft tissue disorders: Secondary | ICD-10-CM

## 2022-01-22 DIAGNOSIS — I350 Nonrheumatic aortic (valve) stenosis: Secondary | ICD-10-CM

## 2022-01-26 ENCOUNTER — Telehealth: Payer: Self-pay | Admitting: *Deleted

## 2022-01-26 ENCOUNTER — Ambulatory Visit: Payer: Medicare Other | Admitting: Internal Medicine

## 2022-01-26 NOTE — Telephone Encounter (Signed)
Patient is calling for status of referral for nerve conduction study, faxed to [facility], confirmation received -01/26/22. Patient has been updated that referral has been sent. He was given number to contact.

## 2022-01-27 ENCOUNTER — Other Ambulatory Visit: Payer: Self-pay | Admitting: Endocrinology

## 2022-01-27 NOTE — Progress Notes (Unsigned)
Cardiology Office Note   Date:  01/28/2022   ID:  William Barron, DOB Jun 16, 1945, MRN [id number]  PCP:  William Hatchet, MD  Cardiologist:   William Breeding, MD   Chief Complaint  Patient presents with   Edema       History of Present Illness: William Barron is a 76 y.o. male who presents for evaluation of coronary calcium.  He had an abnormal POET (Plain Old Exercise Treadmill) and significant calcium in his coronaries.  However, cath demonstrated mild CAD. He had a follow up echo last year.   He had some moderate PR but this was unchanged from previous.  He has no pulmonary HTN and normal RV/LV function. He had a whipple procedure.  Thankfully this was a benign lesion.   He returns for follow up.   At the last visit he had increased leg swelling.  He was found to have moderately elevated pulmonary pressures.  His LV EF was 55%.  I gave him a little Lasix at that time and he did not think it seemed to make a big difference.  He has had continued problems with foot drop since his long rehabilitation after surgery.  He has minimal mobility on his foot because of ankle surgery.  All of this allows him to get around with a walker and he has difficult gait.  He has not walked very much.  He is going to get some nerve conduction studies apparently.  His swelling is unchanged but he is not describing PND or orthopnea.  This is up to his knees.  He is not having any new chest pressure, neck or arm discomfort.  He had no palpitations, presyncope or syncope.   Past Medical History:  Diagnosis Date   Amyloidosis ([place])    Diabetes mellitus without complication ([place])    GERD (gastroesophageal reflux disease)    Hearing loss    Has hearing aids   History of kidney stones    Hyperlipidemia    Multiple myeloma ([facility])    and amylodosis    Nephrolithiasis    Occasional tremors    Pancreatic insufficiency    Pneumonia    Prostate cancer ([place])    Prostatitis    acute and chronic    Skin cancer     Past Surgical History:  Procedure Laterality Date   APPENDECTOMY     CARDIAC CATHETERIZATION N/A 06/13/2015   Procedure: Left Heart Cath and Coronary Angiography;  Surgeon: Jettie Booze, MD;  Location: [facility];  Service: Cardiovascular;  Laterality: N/A;   COLONOSCOPY  04/07/2000   ELBOW SURGERY  03/15/2001   right   ESOPHAGOGASTRODUODENOSCOPY (EGD) WITH PROPOFOL N/A 09/07/2021   Procedure: ESOPHAGOGASTRODUODENOSCOPY (EGD) WITH PROPOFOL;  Surgeon: Irene Shipper, MD;  Location: [place];  Service: Gastroenterology;  Laterality: N/A;   EXTRACORPOREAL SHOCK WAVE LITHOTRIPSY Left 07/17/2018   Procedure: EXTRACORPOREAL SHOCK WAVE LITHOTRIPSY (ESWL);  Surgeon: [name], MD;  [facility];  Service: Urology;  Laterality: Left;   EXTRACORPOREAL SHOCK WAVE LITHOTRIPSY Right 12/08/2020   Procedure: RIGHT EXTRACORPOREAL SHOCK WAVE LITHOTRIPSY (ESWL);  Surgeon: Raynelle Bring, MD;  [facility];  Service: Urology;  Laterality: Right;   FOOT ARTHRODESIS Right 04/15/2021   Procedure: RIGHT SUBTALAR AND TALONAVICULAR FUSION;  Surgeon: Newt Minion, MD;  Location: [facility];  Service: Orthopedics;  Laterality: Right;   HERNIA REPAIR     IR MECH REMOV OBSTRUC MAT ANY COLON TUBE W/FLUORO  08/27/2021   IR REPLC GASTRO/COLONIC TUBE PERCUT W/FLUORO  09/08/2021   ROTATOR CUFF REPAIR     WHIPPLE PROCEDURE  07/13/2021     Current Outpatient Medications  Medication Sig Dispense Refill   BD PEN NEEDLE NANO U/F 32G X 4 MM MISC USE AS DIRECTED WITH INSULIN PEN THREE TIMES DAILY 200 each 12   Continuous Blood Gluc Receiver (FREESTYLE LIBRE 2 READER) DEVI Use as instructed to check blood sugars 1 each 0   Continuous Blood Gluc Sensor (FREESTYLE LIBRE 2 SENSOR) MISC Change every 14 days 2 each 2   lipase/protease/amylase (CREON) 36000 UNITS CPEP capsule Take 3 capsules (108,000 Units total) by mouth 3 (three) times daily with meals. May also take 1  capsule (36,000 Units total) as needed (with snacks). 330 capsule 11   pantoprazole sodium (PROTONIX) 40 mg Take 40 mg by mouth daily. 30 packet    Testosterone 20.25 MG/ACT (1.62%) GEL Apply 4 Pump topically daily.     insulin aspart (NOVOLOG FLEXPEN) 100 UNIT/ML FlexPen Inject 0-10 Units into the skin 4 (four) times daily. Per sliding scale: CBG     0-200: 0 units CBG 201-250: 2 units CBG 251-300: 4 units CBG 301-350: 6 units CBG 351-400: 8 units CBG   >   400: 10 units     insulin degludec (TRESIBA FLEXTOUCH) 100 UNIT/ML FlexTouch Pen Inject 15 Units into the skin daily. 15 mL 1   No current facility-administered medications for this visit.    Allergies:   Patient has no known allergies.    ROS:  Please see the history of present illness.   Otherwise, review of systems are positive for none.   All other systems are reviewed and negative.    PHYSICAL EXAM: VS:  BP (!) 140/70   Pulse 71   Ht 5' 5" (1.651 m)   Wt 146 lb 3.2 oz (66.3 kg)   SpO2 97%   BMI 24.33 kg/m  , BMI Body mass index is 24.33 kg/m.  GENERAL:  Well appearing NECK:  No jugular venous distention, waveform within normal limits, carotid upstroke brisk and symmetric, no bruits, no thyromegaly LUNGS:  Clear to auscultation bilaterally CHEST:  Unremarkable HEART:  PMI not displaced or sustained,S1 and S2 within normal limits, no S3, no S4, no clicks, no rubs, 3 out of 6 systolic murmur, radiating out the left upper sternal border, 2 out of 6 diastolic left upper sternal murmur ABD:  Flat, positive bowel sounds normal in frequency in pitch, no bruits, no rebound, no guarding, no midline pulsatile mass, no hepatomegaly, no splenomegaly EXT:  2 plus pulses throughout, moderate edema, no cyanosis no clubbing  EKG:  EKG is not ordered today.    Recent Labs: 08/12/2021: TSH 0.399 09/10/2021: ALT 19; Magnesium 2.1 12/07/2021: BUN 12; Creatinine, Ser 1.06; Hemoglobin 8.2; Platelets 407; Potassium 3.4; Sodium 141       Wt Readings from Last 3 Encounters:  01/28/22 146 lb 3.2 oz (66.3 kg)  01/14/22 148 lb 4 oz (67.2 kg)  01/05/22 149 lb 12.8 oz (67.9 kg)      Other studies Reviewed: Additional studies/ records that were reviewed today include:   Echo Review of the above records demonstrates:   See above   ASSESSMENT AND PLAN:  ELEVATED CORONARY CALCIUM:    He has had no ongoing symptoms.  No change in therapy.   PULMONIC REGURGITATION:    He has had moderate pulmonic insufficiency.   RV function was normal.  Pulmonary pressure  was normal.  I am going to follow this clinically.  I will repeat an echo in March.   AS/AI:   This was mild to moderate.  I will follow this clinically and as above  MM:    He is going back to [name] to be seen.   He has been treated with daratumumab/dexamethasone.  There was no evidence of amyloidosis involving his heart.  There is a little less than 10% risk of atrial fibrillation.    Leg swelling: This has not increased.  I am going to continue to manage this conservatively with compression stockings.  He did not have any significant improvement with his Lasix.  No change in therapy.   DM: A1c was recently found to be 7.4.  He is not being managed for this.   The following changes have been made:   None  Labs/ tests ordered today include:      Orders Placed This Encounter  Procedures   ECHOCARDIOGRAM COMPLETE    Disposition:   FU with me in April  Signed, William Breeding, MD  01/28/2022 1:15 PM    [facility]

## 2022-01-28 ENCOUNTER — Ambulatory Visit: Payer: Medicare Other | Attending: Cardiology | Admitting: Cardiology

## 2022-01-28 ENCOUNTER — Other Ambulatory Visit: Payer: Self-pay

## 2022-01-28 ENCOUNTER — Ambulatory Visit (INDEPENDENT_AMBULATORY_CARE_PROVIDER_SITE_OTHER): Payer: Medicare Other | Admitting: Neurology

## 2022-01-28 ENCOUNTER — Encounter: Payer: Self-pay | Admitting: Cardiology

## 2022-01-28 VITALS — BP 140/70 | HR 71 | Ht 65.0 in | Wt 146.2 lb

## 2022-01-28 DIAGNOSIS — E1142 Type 2 diabetes mellitus with diabetic polyneuropathy: Secondary | ICD-10-CM

## 2022-01-28 DIAGNOSIS — R202 Paresthesia of skin: Secondary | ICD-10-CM

## 2022-01-28 DIAGNOSIS — G5701 Lesion of sciatic nerve, right lower limb: Secondary | ICD-10-CM

## 2022-01-28 DIAGNOSIS — R931 Abnormal findings on diagnostic imaging of heart and coronary circulation: Secondary | ICD-10-CM | POA: Diagnosis not present

## 2022-01-28 DIAGNOSIS — M7989 Other specified soft tissue disorders: Secondary | ICD-10-CM | POA: Diagnosis not present

## 2022-01-28 DIAGNOSIS — I371 Nonrheumatic pulmonary valve insufficiency: Secondary | ICD-10-CM

## 2022-01-28 NOTE — Procedures (Signed)
[facility] Neurology  [address]  [place], [place] [address] Tel: [phone] Fax:  [phone] Test Date:  01/28/2022  Patient: William Barron DOB: 10-Jun-1945 Physician: Arvin Collard Leon Goodnow,DO  Sex: Male Height: '5\' 5"'$  Ref Phys: Daylene Katayama, DPM  ID#: [id number]   Technician:    Patient Complaints: This is a 76 year old man referred for evaluation of right foot drop.  NCV & EMG Findings: Extensive electrodiagnostic testing of the right lower extremity and additional studies of the left shows:  Bilateral sural and superficial peroneal sensory responses are absent. Right peroneal motor response is severely reduced at the extensor digitorum brevis and tibialis anterior as compared to the left.  Additionally, there is evidence of conduction block and conduction velocity slowing across the fibular head.  Left peroneal motor response at the extensor digitorum brevis is mildly reduced, and normal at the tibialis anterior.  Bilateral tibial motor responses are mildly reduced. Bilateral tibial H reflex studies are within normal limits. Chronic motor axonal loss changes are seen affecting the muscles below the knee bilaterally, which is severe in the right anterior tibialis and peroneus longus muscles.  Fibrillation potentials are isolated to the right anterior tibialis muscle.  Impression: The electrophysiologic findings are consistent with a chronic sensorimotor axonal polyneuropathy affecting bilateral lower extremities, severe. 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    ___________________________ Arvin Collard Tremond Shimabukuro,DO    Nerve Conduction Studies Anti Sensory Summary Table   Stim Site NR Peak (ms) Norm Peak (ms) O-P Amp (V) Norm O-P Amp  Left Sup Peroneal Anti Sensory (Ant Lat Mall)  32C  12 cm NR  <4.6  >3  Right Sup Peroneal Anti Sensory (Ant Lat Mall)  32C  12 cm NR   <4.6  >3  Left Sural Anti Sensory (Lat Mall)  32C  Calf NR  <4.6  >3  Right Sural Anti Sensory (Lat Mall)  32C  Calf NR  <4.6  >3   Motor Summary Table   Stim Site NR Onset (ms) Norm Onset (ms) O-P Amp (mV) Norm O-P Amp Site1 Site2 Delta-0 (ms) Dist (cm) Vel (m/s) Norm Vel (m/s)  Left Peroneal Motor (Ext Dig Brev)  32C  Ankle    2.8 <6.0 2.2 >2.5 B Fib Ankle 6.8 37.0 54 >40  B Fib    9.6  1.8  Poplt B Fib 1.7 8.0 47 >40  Poplt    11.3  1.6         Right Peroneal Motor (Ext Dig Brev)  32C  Ankle    3.6 <6.0 0.7 >2.5 B Fib Ankle 6.4 30.0 47 >40  B Fib    10.0  0.5  Poplt B Fib  7.0  >40  Poplt NR            Left Peroneal TA Motor (Tib Ant)  32C  Fib Head    3.1 <4.5 3.2 >3 Poplit Fib Head 1.4 7.0 50 >40  Poplit    4.5  3.0         Right Peroneal TA Motor (Tib Ant)  32C  Fib Head    3.3 <4.5 1.2 >3 Poplit Fib Head 2.2 7.0 32 >40  Poplit    5.5  0.2         Left Tibial Motor (Abd Hall Brev)  32C  Ankle    4.0 <6.0 3.6 >4 Knee Ankle 7.8 38.0 49 >40  Knee  11.8  2.7         Right Tibial Motor (Abd Hall Brev)  32C  Ankle    4.0 <6.0 2.2 >4 Knee Ankle 7.6 40.0 53 >40  Knee    11.6  2.3          H Reflex Studies   NR H-Lat (ms) Lat Norm (ms) L-R H-Lat (ms)  Left Tibial (Gastroc)  32C     34.57 <35 2.45  Right Tibial (Gastroc)  32C     32.93 <35 2.45   EMG   Side Muscle Ins Act Fibs Fasc Recrt Dur. Amp. Poly. Activation Comment  Right AntTibialis Nml 1+ Nml SMU 1+ 1+ 1+ Nml N/A  Right Gastroc Nml Nml Nml 2- 1+ 1+ 1+ Nml N/A  Right Flex Dig Long Nml Nml Nml 2- 1+ 1+ 1+ Nml N/A  Right RectFemoris Nml Nml Nml Nml Nml Nml Nml Nml N/A  Right GluteusMed Nml Nml Nml Nml Nml Nml Nml Nml N/A  Right BicepsFemS Nml Nml Nml Nml Nml Nml Nml Nml N/A  Right Fibularis Long Nml Nml Nml SMU 1+ 1+ 1+ Nml N/A  Left AntTibialis Nml Nml Nml 1- 1+ 1+ 1+ Nml N/A  Left Gastroc Nml Nml Nml 1- 1+ 1+ 1+ Nml N/A      Waveforms:

## 2022-01-28 NOTE — Patient Instructions (Signed)
Medication Instructions:  Your physician recommends that you continue on your current medications as directed. Please refer to the Current Medication list given to you today.  *If you need a refill on your cardiac medications before your next appointment, please call your pharmacy*  Testing/Procedures: Your physician has requested that you have an echocardiogram in MARCH 2024. Echocardiography is a painless test that uses sound waves to create images of your heart. It provides your doctor with information about the size and shape of your heart and how well your heart's chambers and valves are working. This procedure takes approximately one hour. There are no restrictions for this procedure. Please do NOT wear cologne, perfume, aftershave, or lotions (deodorant is allowed). Please arrive 15 minutes prior to your appointment time.  Follow-Up: At [facility], you and your health needs are our priority.  As part of our continuing mission to provide you with exceptional heart care, we have created designated Provider Care Teams.  These Care Teams include your primary Cardiologist (physician) and Advanced Practice Providers (APPs -  Physician Assistants and Nurse Practitioners) who all work together to provide you with the care you need, when you need it.  We recommend signing up for the patient portal called "MyChart".  Sign up information is provided on this After Visit Summary.  MyChart is used to connect with patients for Virtual Visits (Telemedicine).  Patients are able to view lab/test results, encounter notes, upcoming appointments, etc.  Non-urgent messages can be sent to your provider as well.   To learn more about what you can do with MyChart, go to [email or web address].    Your next appointment:   March with Dr. Percival Spanish (after echo)

## 2022-02-02 ENCOUNTER — Ambulatory Visit: Payer: Medicare Other | Admitting: Dietician

## 2022-02-08 ENCOUNTER — Other Ambulatory Visit: Payer: Self-pay

## 2022-02-08 DIAGNOSIS — E1165 Type 2 diabetes mellitus with hyperglycemia: Secondary | ICD-10-CM

## 2022-02-08 MED ORDER — FREESTYLE LIBRE 3 SENSOR MISC: 3 refills | Status: DC

## 2022-02-11 ENCOUNTER — Ambulatory Visit: Payer: Medicare Other | Admitting: Orthopedic Surgery

## 2022-02-12 ENCOUNTER — Telehealth: Payer: Self-pay

## 2022-02-12 DIAGNOSIS — E1165 Type 2 diabetes mellitus with hyperglycemia: Secondary | ICD-10-CM

## 2022-02-12 NOTE — Telephone Encounter (Signed)
Patient called in wanting to know if I had faxed over prescription for [facility] to [place]. Patient states he thinks it will need to be sent elsewhere but he will call back with that information.

## 2022-02-15 ENCOUNTER — Ambulatory Visit: Payer: Medicare Other | Admitting: Orthopedic Surgery

## 2022-02-17 ENCOUNTER — Ambulatory Visit (INDEPENDENT_AMBULATORY_CARE_PROVIDER_SITE_OTHER): Payer: Medicare Other | Admitting: Podiatry

## 2022-02-17 ENCOUNTER — Encounter: Payer: Self-pay | Admitting: Podiatry

## 2022-02-17 VITALS — BP 151/78 | HR 96

## 2022-02-17 DIAGNOSIS — M21371 Foot drop, right foot: Secondary | ICD-10-CM

## 2022-02-17 NOTE — Progress Notes (Signed)
Chief Complaint  Patient presents with   Follow-up    Patient is here for follow-up for nerve conduction.patient states that he is using keratin on bilateral toe nail.    HPI: 76 y.o. male PMHx diabetes mellitus presenting today with his wife for follow-up evaluation of loss of strength to the right lower extremity.  Patient has a history of double arthrodesis to the right foot performed here in [facility] by Dr. Sharol Given.  Following the surgery he ended up at [facility] where a Whipple procedure was performed followed by long inpatient rehab.  He has now been home for about 2 months and is having problems with his right lower extremity.  He was prescribed an AFO brace that he wears daily.  He has been diagnosed with dropfoot.    Last visit nerve conduction studies were ordered.  Presenting to review the results  Past Medical History:  Diagnosis Date   Amyloidosis ([place])    Diabetes mellitus without complication ([place])    GERD (gastroesophageal reflux disease)    Hearing loss    Has hearing aids   History of kidney stones    Hyperlipidemia    Multiple myeloma ([facility])    and amylodosis    Nephrolithiasis    Occasional tremors    Pancreatic insufficiency    Pneumonia    Prostate cancer ([place])    Prostatitis    acute and chronic   Skin cancer     Past Surgical History:  Procedure Laterality Date   APPENDECTOMY     CARDIAC CATHETERIZATION N/A 06/13/2015   Procedure: Left Heart Cath and Coronary Angiography;  Surgeon: Jettie Booze, MD;  Location: [place];  Service: Cardiovascular;  Laterality: N/A;   COLONOSCOPY  04/07/2000   ELBOW SURGERY  03/15/2001   right   ESOPHAGOGASTRODUODENOSCOPY (EGD) WITH PROPOFOL N/A 09/07/2021   Procedure: ESOPHAGOGASTRODUODENOSCOPY (EGD) WITH PROPOFOL;  Surgeon: Irene Shipper, MD;  Location: [facility];  Service: Gastroenterology;  Laterality: N/A;   EXTRACORPOREAL SHOCK WAVE LITHOTRIPSY Left 07/17/2018   Procedure: EXTRACORPOREAL  SHOCK WAVE LITHOTRIPSY (ESWL);  Surgeon: [name], MD;  [facility];  Service: Urology;  Laterality: Left;   EXTRACORPOREAL SHOCK WAVE LITHOTRIPSY Right 12/08/2020   Procedure: RIGHT EXTRACORPOREAL SHOCK WAVE LITHOTRIPSY (ESWL);  Surgeon: Raynelle Bring, MD;  Location: [facility];  Service: Urology;  Laterality: Right;   FOOT ARTHRODESIS Right 04/15/2021   Procedure: RIGHT SUBTALAR AND TALONAVICULAR FUSION;  Surgeon: Newt Minion, MD;  Location: [place];  Service: Orthopedics;  Laterality: Right;   HERNIA REPAIR     IR MECH REMOV OBSTRUC MAT ANY COLON TUBE W/FLUORO  08/27/2021   IR [place] GASTRO/COLONIC TUBE PERCUT W/FLUORO  09/08/2021   ROTATOR CUFF REPAIR     WHIPPLE PROCEDURE  07/13/2021    No Known Allergies   Physical Exam: General: The patient is alert and oriented x3 in no acute distress.  Dermatology: Skin is warm, dry and supple bilateral lower extremities. Negative for open lesions or macerations.  Incisions healed.  Vascular: Palpable pedal pulses bilaterally. Capillary refill within normal limits.  Chronic lateral lower extremity edema noted.  Neurological: Light touch and protective threshold grossly intact  Musculoskeletal Exam: Loss of ankle joint eversion.  Only able to evert the foot against light resistance.  Muscle strength 5/5 LLE.  Radiographic Exam RT foot 01/04/2022:  Orthopedic staple across the TN joint which appears stable and intact.  Two orthopedic screws traversing the subtalar joint which again appears  stable and intact.  Routine healing of the arthrodesis sites noted.  No acute fracture.  No nonunion.  No loosening of the hardware  NCV & EMG Findings: Extensive electrodiagnostic testing of the right lower extremity and additional studies of the left shows:  Bilateral sural and superficial peroneal sensory responses are absent. Right peroneal motor response is severely reduced at the extensor digitorum brevis and tibialis anterior as  compared to the left.  Additionally, there is evidence of conduction block and conduction velocity slowing across the fibular head.  Left peroneal motor response at the extensor digitorum brevis is mildly reduced, and normal at the tibialis anterior.  Bilateral tibial motor responses are mildly reduced. Bilateral tibial H reflex studies are within normal limits. Chronic motor axonal loss changes are seen affecting the muscles below the knee bilaterally, which is severe in the right anterior tibialis and peroneus longus muscles.  Fibrillation potentials are isolated to the right anterior tibialis muscle.   Impression: The electrophysiologic findings are consistent with a chronic sensorimotor axonal polyneuropathy affecting bilateral lower extremities, severe. 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  Assessment: 1. PSxHx double arthrodesis RT foot. Feb 2023. Dr. Sharol Given 2.  Possible iatrogenic dropfoot RLE 3. H/o recent whipple procedure 07/13/2021 w/ inpatient rehab. Patient has been home now for only about 2 months   Plan of Care:  1. Patient evaluated.  2. NCV and EMG findings reviewed 3. Recommend f/u with neurology. Consult placed.  4.  Continue AFO brace RLE 5.  Return to clinic as needed       Edrick Kins, DPM [facility]  Dr. Edrick Kins, DPM    [address], [place] [address]                Office [phone]  Fax [phone]

## 2022-02-19 NOTE — Telephone Encounter (Signed)
Patient called states that he is trying to get his medication from [place]. States that he needs a letter from you on why he needs CGM and also the other medication you have him on as well as pen needles.

## 2022-03-01 ENCOUNTER — Other Ambulatory Visit: Payer: Self-pay

## 2022-03-01 DIAGNOSIS — M779 Enthesopathy, unspecified: Secondary | ICD-10-CM

## 2022-03-01 DIAGNOSIS — M21371 Foot drop, right foot: Secondary | ICD-10-CM

## 2022-03-02 ENCOUNTER — Ambulatory Visit: Payer: Medicare Other | Admitting: Internal Medicine

## 2022-03-05 ENCOUNTER — Telehealth: Payer: Self-pay

## 2022-03-05 ENCOUNTER — Encounter (HOSPITAL_COMMUNITY): Payer: Self-pay

## 2022-03-05 ENCOUNTER — Emergency Department (HOSPITAL_COMMUNITY): Payer: No Typology Code available for payment source

## 2022-03-05 ENCOUNTER — Telehealth: Payer: Self-pay | Admitting: Endocrinology

## 2022-03-05 ENCOUNTER — Other Ambulatory Visit: Payer: Self-pay

## 2022-03-05 ENCOUNTER — Inpatient Hospital Stay (HOSPITAL_COMMUNITY)
Admission: EM | Admit: 2022-03-05 | Discharge: 2022-03-22 | DRG: 064 | Disposition: A | Discharge status: Rehabilitation Facility | Payer: No Typology Code available for payment source | Attending: Family Medicine | Admitting: Family Medicine

## 2022-03-05 ENCOUNTER — Observation Stay (HOSPITAL_COMMUNITY): Payer: No Typology Code available for payment source

## 2022-03-05 DIAGNOSIS — J690 Pneumonitis due to inhalation of food and vomit: Secondary | ICD-10-CM | POA: Diagnosis not present

## 2022-03-05 DIAGNOSIS — Z981 Arthrodesis status: Secondary | ICD-10-CM

## 2022-03-05 DIAGNOSIS — J9811 Atelectasis: Secondary | ICD-10-CM | POA: Diagnosis present

## 2022-03-05 DIAGNOSIS — I639 Cerebral infarction, unspecified: Principal | ICD-10-CM

## 2022-03-05 DIAGNOSIS — I340 Nonrheumatic mitral (valve) insufficiency: Secondary | ICD-10-CM | POA: Diagnosis present

## 2022-03-05 DIAGNOSIS — J09X1 Influenza due to identified novel influenza A virus with pneumonia: Secondary | ICD-10-CM | POA: Diagnosis present

## 2022-03-05 DIAGNOSIS — I5021 Acute systolic (congestive) heart failure: Secondary | ICD-10-CM | POA: Diagnosis present

## 2022-03-05 DIAGNOSIS — B370 Candidal stomatitis: Secondary | ICD-10-CM | POA: Diagnosis present

## 2022-03-05 DIAGNOSIS — J180 Bronchopneumonia, unspecified organism: Secondary | ICD-10-CM | POA: Diagnosis present

## 2022-03-05 DIAGNOSIS — Z79890 Hormone replacement therapy (postmenopausal): Secondary | ICD-10-CM

## 2022-03-05 DIAGNOSIS — E869 Volume depletion, unspecified: Secondary | ICD-10-CM | POA: Diagnosis present

## 2022-03-05 DIAGNOSIS — Z6820 Body mass index (BMI) 20.0-20.9, adult: Secondary | ICD-10-CM

## 2022-03-05 DIAGNOSIS — I371 Nonrheumatic pulmonary valve insufficiency: Secondary | ICD-10-CM | POA: Diagnosis present

## 2022-03-05 DIAGNOSIS — Z79899 Other long term (current) drug therapy: Secondary | ICD-10-CM

## 2022-03-05 DIAGNOSIS — I472 Ventricular tachycardia, unspecified: Secondary | ICD-10-CM | POA: Diagnosis not present

## 2022-03-05 DIAGNOSIS — E859 Amyloidosis, unspecified: Secondary | ICD-10-CM | POA: Diagnosis present

## 2022-03-05 DIAGNOSIS — E876 Hypokalemia: Secondary | ICD-10-CM | POA: Diagnosis present

## 2022-03-05 DIAGNOSIS — R29700 NIHSS score 0: Secondary | ICD-10-CM | POA: Diagnosis not present

## 2022-03-05 DIAGNOSIS — Z8507 Personal history of malignant neoplasm of pancreas: Secondary | ICD-10-CM

## 2022-03-05 DIAGNOSIS — J9601 Acute respiratory failure with hypoxia: Secondary | ICD-10-CM | POA: Diagnosis present

## 2022-03-05 DIAGNOSIS — Z800 Family history of malignant neoplasm of digestive organs: Secondary | ICD-10-CM

## 2022-03-05 DIAGNOSIS — C9000 Multiple myeloma not having achieved remission: Secondary | ICD-10-CM | POA: Diagnosis present

## 2022-03-05 DIAGNOSIS — I4901 Ventricular fibrillation: Secondary | ICD-10-CM | POA: Diagnosis not present

## 2022-03-05 DIAGNOSIS — J1008 Influenza due to other identified influenza virus with other specified pneumonia: Secondary | ICD-10-CM | POA: Diagnosis present

## 2022-03-05 DIAGNOSIS — Z66 Do not resuscitate: Secondary | ICD-10-CM | POA: Diagnosis not present

## 2022-03-05 DIAGNOSIS — Z8249 Family history of ischemic heart disease and other diseases of the circulatory system: Secondary | ICD-10-CM

## 2022-03-05 DIAGNOSIS — K219 Gastro-esophageal reflux disease without esophagitis: Secondary | ICD-10-CM | POA: Diagnosis present

## 2022-03-05 DIAGNOSIS — I469 Cardiac arrest, cause unspecified: Secondary | ICD-10-CM

## 2022-03-05 DIAGNOSIS — Z9079 Acquired absence of other genital organ(s): Secondary | ICD-10-CM

## 2022-03-05 DIAGNOSIS — E1142 Type 2 diabetes mellitus with diabetic polyneuropathy: Secondary | ICD-10-CM | POA: Diagnosis present

## 2022-03-05 DIAGNOSIS — R590 Localized enlarged lymph nodes: Secondary | ICD-10-CM | POA: Diagnosis present

## 2022-03-05 DIAGNOSIS — H4921 Sixth [abducent] nerve palsy, right eye: Secondary | ICD-10-CM | POA: Diagnosis present

## 2022-03-05 DIAGNOSIS — I110 Hypertensive heart disease with heart failure: Secondary | ICD-10-CM | POA: Diagnosis present

## 2022-03-05 DIAGNOSIS — I4891 Unspecified atrial fibrillation: Secondary | ICD-10-CM | POA: Diagnosis present

## 2022-03-05 DIAGNOSIS — E119 Type 2 diabetes mellitus without complications: Secondary | ICD-10-CM

## 2022-03-05 DIAGNOSIS — Z87442 Personal history of urinary calculi: Secondary | ICD-10-CM

## 2022-03-05 DIAGNOSIS — R29701 NIHSS score 1: Secondary | ICD-10-CM | POA: Diagnosis present

## 2022-03-05 DIAGNOSIS — K30 Functional dyspepsia: Secondary | ICD-10-CM | POA: Diagnosis not present

## 2022-03-05 DIAGNOSIS — Z90410 Acquired total absence of pancreas: Secondary | ICD-10-CM

## 2022-03-05 DIAGNOSIS — E8589 Other amyloidosis: Secondary | ICD-10-CM

## 2022-03-05 DIAGNOSIS — G5730 Lesion of lateral popliteal nerve, unspecified lower limb: Secondary | ICD-10-CM | POA: Diagnosis present

## 2022-03-05 DIAGNOSIS — I3139 Other pericardial effusion (noninflammatory): Secondary | ICD-10-CM | POA: Diagnosis present

## 2022-03-05 DIAGNOSIS — Z1152 Encounter for screening for COVID-19: Secondary | ICD-10-CM

## 2022-03-05 DIAGNOSIS — E785 Hyperlipidemia, unspecified: Secondary | ICD-10-CM | POA: Diagnosis not present

## 2022-03-05 DIAGNOSIS — R54 Age-related physical debility: Secondary | ICD-10-CM | POA: Diagnosis present

## 2022-03-05 DIAGNOSIS — G510 Bell's palsy: Secondary | ICD-10-CM | POA: Diagnosis present

## 2022-03-05 DIAGNOSIS — R627 Adult failure to thrive: Secondary | ICD-10-CM | POA: Diagnosis present

## 2022-03-05 DIAGNOSIS — Z934 Other artificial openings of gastrointestinal tract status: Secondary | ICD-10-CM

## 2022-03-05 DIAGNOSIS — Z87891 Personal history of nicotine dependence: Secondary | ICD-10-CM

## 2022-03-05 DIAGNOSIS — K119 Disease of salivary gland, unspecified: Secondary | ICD-10-CM | POA: Diagnosis present

## 2022-03-05 DIAGNOSIS — I462 Cardiac arrest due to underlying cardiac condition: Secondary | ICD-10-CM | POA: Diagnosis not present

## 2022-03-05 DIAGNOSIS — Z794 Long term (current) use of insulin: Secondary | ICD-10-CM

## 2022-03-05 DIAGNOSIS — H5704 Mydriasis: Secondary | ICD-10-CM | POA: Diagnosis present

## 2022-03-05 DIAGNOSIS — I63431 Cerebral infarction due to embolism of right posterior cerebral artery: Secondary | ICD-10-CM | POA: Diagnosis not present

## 2022-03-05 DIAGNOSIS — G250 Essential tremor: Secondary | ICD-10-CM | POA: Diagnosis present

## 2022-03-05 DIAGNOSIS — Z993 Dependence on wheelchair: Secondary | ICD-10-CM

## 2022-03-05 DIAGNOSIS — Z90411 Acquired partial absence of pancreas: Secondary | ICD-10-CM

## 2022-03-05 DIAGNOSIS — Z86018 Personal history of other benign neoplasm: Secondary | ICD-10-CM

## 2022-03-05 DIAGNOSIS — D869 Sarcoidosis, unspecified: Secondary | ICD-10-CM | POA: Diagnosis present

## 2022-03-05 DIAGNOSIS — I420 Dilated cardiomyopathy: Secondary | ICD-10-CM | POA: Diagnosis present

## 2022-03-05 DIAGNOSIS — H538 Other visual disturbances: Secondary | ICD-10-CM | POA: Diagnosis not present

## 2022-03-05 DIAGNOSIS — H9190 Unspecified hearing loss, unspecified ear: Secondary | ICD-10-CM | POA: Diagnosis present

## 2022-03-05 DIAGNOSIS — Z8042 Family history of malignant neoplasm of prostate: Secondary | ICD-10-CM

## 2022-03-05 DIAGNOSIS — Z85828 Personal history of other malignant neoplasm of skin: Secondary | ICD-10-CM

## 2022-03-05 DIAGNOSIS — Z8546 Personal history of malignant neoplasm of prostate: Secondary | ICD-10-CM

## 2022-03-05 DIAGNOSIS — R1310 Dysphagia, unspecified: Secondary | ICD-10-CM | POA: Diagnosis present

## 2022-03-05 DIAGNOSIS — Z9049 Acquired absence of other specified parts of digestive tract: Secondary | ICD-10-CM

## 2022-03-05 DIAGNOSIS — Z923 Personal history of irradiation: Secondary | ICD-10-CM

## 2022-03-05 DIAGNOSIS — K118 Other diseases of salivary glands: Secondary | ICD-10-CM | POA: Diagnosis present

## 2022-03-05 DIAGNOSIS — E1165 Type 2 diabetes mellitus with hyperglycemia: Secondary | ICD-10-CM

## 2022-03-05 DIAGNOSIS — Z974 Presence of external hearing-aid: Secondary | ICD-10-CM

## 2022-03-05 DIAGNOSIS — I5043 Acute on chronic combined systolic (congestive) and diastolic (congestive) heart failure: Secondary | ICD-10-CM | POA: Diagnosis present

## 2022-03-05 DIAGNOSIS — E43 Unspecified severe protein-calorie malnutrition: Secondary | ICD-10-CM | POA: Diagnosis present

## 2022-03-05 DIAGNOSIS — I2510 Atherosclerotic heart disease of native coronary artery without angina pectoris: Secondary | ICD-10-CM | POA: Diagnosis present

## 2022-03-05 DIAGNOSIS — N179 Acute kidney failure, unspecified: Secondary | ICD-10-CM | POA: Diagnosis present

## 2022-03-05 DIAGNOSIS — F03A0 Unspecified dementia, mild, without behavioral disturbance, psychotic disturbance, mood disturbance, and anxiety: Secondary | ICD-10-CM | POA: Diagnosis present

## 2022-03-05 DIAGNOSIS — I351 Nonrheumatic aortic (valve) insufficiency: Secondary | ICD-10-CM | POA: Diagnosis present

## 2022-03-05 DIAGNOSIS — M21371 Foot drop, right foot: Secondary | ICD-10-CM | POA: Diagnosis present

## 2022-03-05 DIAGNOSIS — I493 Ventricular premature depolarization: Secondary | ICD-10-CM | POA: Diagnosis not present

## 2022-03-05 HISTORY — DX: Other artificial openings of gastrointestinal tract status: Z93.4

## 2022-03-05 LAB — COMPREHENSIVE METABOLIC PANEL
ALT: 33 U/L (ref 0–44)
AST: 39 U/L (ref 15–41)
Albumin: 3.5 g/dL (ref 3.5–5.0)
Alkaline Phosphatase: 727 U/L — ABNORMAL HIGH (ref 38–126)
Anion gap: 10 (ref 5–15)
BUN: 13 mg/dL (ref 8–23)
CO2: 23 mmol/L (ref 22–32)
Calcium: 9.1 mg/dL (ref 8.9–10.3)
Chloride: 106 mmol/L (ref 98–111)
Creatinine, Ser: 0.98 mg/dL (ref 0.61–1.24)
GFR, Estimated: >60 mL/min (ref >60)
Glucose, Bld: 128 mg/dL — ABNORMAL HIGH (ref 70–99)
Potassium: 3.2 mmol/L — ABNORMAL LOW (ref 3.5–5.1)
Sodium: 139 mmol/L (ref 135–145)
Total Bilirubin: 0.4 mg/dL (ref 0.3–1.2)
Total Protein: 7.1 g/dL (ref 6.5–8.1)

## 2022-03-05 LAB — I-STAT CHEM 8, ED
BUN: 13 mg/dL (ref 8–23)
Calcium, Ion: 1.25 mmol/L (ref 1.15–1.40)
Chloride: 104 mmol/L (ref 98–111)
Creatinine, Ser: 0.8 mg/dL (ref 0.61–1.24)
Glucose, Bld: 120 mg/dL — ABNORMAL HIGH (ref 70–99)
HCT: 35 % — ABNORMAL LOW (ref 39.0–52.0)
Hemoglobin: 11.9 g/dL — ABNORMAL LOW (ref 13.0–17.0)
Potassium: 3.3 mmol/L — ABNORMAL LOW (ref 3.5–5.1)
Sodium: 141 mmol/L (ref 135–145)
TCO2: 23 mmol/L (ref 22–32)

## 2022-03-05 LAB — DIFFERENTIAL
Abs Immature Granulocytes: 0.02 10*3/uL (ref 0.00–0.07)
Basophils Absolute: 0.1 10*3/uL (ref 0.0–0.1)
Basophils Relative: 1 %
Eosinophils Absolute: 0.1 10*3/uL (ref 0.0–0.5)
Eosinophils Relative: 2 %
Immature Granulocytes: 0 %
Lymphocytes Relative: 16 %
Lymphs Abs: 1.1 10*3/uL (ref 0.7–4.0)
Monocytes Absolute: 0.5 10*3/uL (ref 0.1–1.0)
Monocytes Relative: 7 %
Neutro Abs: 5.2 10*3/uL (ref 1.7–7.7)
Neutrophils Relative %: 74 %

## 2022-03-05 LAB — CBC
HCT: 34.1 % — ABNORMAL LOW (ref 39.0–52.0)
Hemoglobin: 10.4 g/dL — ABNORMAL LOW (ref 13.0–17.0)
MCH: 23.9 pg — ABNORMAL LOW (ref 26.0–34.0)
MCHC: 30.5 g/dL (ref 30.0–36.0)
MCV: 78.4 fL — ABNORMAL LOW (ref 80.0–100.0)
Platelets: 419 10*3/uL — ABNORMAL HIGH (ref 150–400)
RBC: 4.35 MIL/uL (ref 4.22–5.81)
RDW: 18.2 % — ABNORMAL HIGH (ref 11.5–15.5)
WBC: 7 10*3/uL (ref 4.0–10.5)
nRBC: 0 % (ref 0.0–0.2)

## 2022-03-05 LAB — APTT: aPTT: 31 seconds (ref 24–36)

## 2022-03-05 LAB — PROTIME-INR
INR: 1.2 (ref 0.8–1.2)
Prothrombin Time: 15.1 seconds (ref 11.4–15.2)

## 2022-03-05 LAB — CBG MONITORING, ED
Glucose-Capillary: 94 mg/dL (ref 70–99)
Glucose-Capillary: 75 mg/dL (ref 70–99)

## 2022-03-05 LAB — C-REACTIVE PROTEIN: CRP: 0.5 mg/dL (ref <1.0)

## 2022-03-05 LAB — SEDIMENTATION RATE: Sed Rate: 48 mm/hr — ABNORMAL HIGH (ref 0–16)

## 2022-03-05 MED ORDER — PANTOPRAZOLE SODIUM 40 MG IV SOLR
40.0000 mg | INTRAVENOUS | Status: DC
  Administered 2022-03-05 – 2022-03-07 (×3): 40 mg via INTRAVENOUS
  Filled 2022-03-05 (×3): qty 10

## 2022-03-05 MED ORDER — ACETAMINOPHEN 500 MG PO TABS
1000.0000 mg | ORAL_TABLET | Freq: Once | ORAL | Status: DC
  Filled 2022-03-05: qty 2

## 2022-03-05 MED ORDER — ONDANSETRON HCL 4 MG/2ML IJ SOLN
4.0000 mg | Freq: Once | INTRAMUSCULAR | Status: AC
  Administered 2022-03-05: 4 mg via INTRAVENOUS
  Filled 2022-03-05: qty 2

## 2022-03-05 MED ORDER — ACETAMINOPHEN 160 MG/5ML PO SOLN
650.0000 mg | ORAL | Status: DC | PRN
  Filled 2022-03-05: qty 20.3

## 2022-03-05 MED ORDER — IOHEXOL 350 MG/ML SOLN
75.0000 mL | Freq: Once | INTRAVENOUS | Status: AC | PRN
  Administered 2022-03-05: 75 mL via INTRAVENOUS

## 2022-03-05 MED ORDER — ASPIRIN 300 MG RE SUPP
300.0000 mg | Freq: Every day | RECTAL | Status: DC
  Administered 2022-03-05: 300 mg via RECTAL
  Filled 2022-03-05: qty 1

## 2022-03-05 MED ORDER — MORPHINE SULFATE (PF) 4 MG/ML IV SOLN
4.0000 mg | Freq: Once | INTRAVENOUS | Status: AC
  Administered 2022-03-05: 4 mg via INTRAVENOUS
  Filled 2022-03-05: qty 1

## 2022-03-05 MED ORDER — FREESTYLE LIBRE 3 SENSOR MISC: 3 refills | Status: DC

## 2022-03-05 MED ORDER — ACETAMINOPHEN 650 MG RE SUPP: 650.0000 mg | RECTAL | Status: DC | PRN

## 2022-03-05 MED ORDER — POTASSIUM CHLORIDE 10 MEQ/100ML IV SOLN
10.0000 meq | INTRAVENOUS | Status: AC
  Administered 2022-03-05 (×2): 10 meq via INTRAVENOUS
  Filled 2022-03-05 (×2): qty 100

## 2022-03-05 MED ORDER — INSULIN GLARGINE-YFGN 100 UNIT/ML ~~LOC~~ SOLN
4.0000 [IU] | Freq: Every day | SUBCUTANEOUS | Status: DC
  Administered 2022-03-06: 4 [IU] via SUBCUTANEOUS
  Filled 2022-03-05 (×2): qty 0.04

## 2022-03-05 MED ORDER — ASPIRIN 325 MG PO TBEC
325.0000 mg | DELAYED_RELEASE_TABLET | Freq: Every day | ORAL | Status: DC
  Administered 2022-03-06: 325 mg via ORAL
  Filled 2022-03-05: qty 1

## 2022-03-05 MED ORDER — STROKE: EARLY STAGES OF RECOVERY BOOK
Freq: Once | Status: AC
  Filled 2022-03-05 (×2): qty 1

## 2022-03-05 MED ORDER — LACTATED RINGERS IV SOLN: INTRAVENOUS | Status: AC

## 2022-03-05 MED ORDER — ACETAMINOPHEN 325 MG PO TABS
650.0000 mg | ORAL_TABLET | ORAL | Status: DC | PRN
  Administered 2022-03-07 – 2022-03-12 (×6): 650 mg via ORAL
  Filled 2022-03-05 (×6): qty 2

## 2022-03-05 NOTE — Telephone Encounter (Signed)
Spoke with wife. She will make changes and let us know if any other issues.

## 2022-03-05 NOTE — Assessment & Plan Note (Signed)
Chronic Stable 

## 2022-03-05 NOTE — Addendum Note (Signed)
Addended by: Cinda Quest on: 03/05/2022 12:58 PM   Modules accepted: Orders

## 2022-03-05 NOTE — Assessment & Plan Note (Signed)
Stable. Need outpatient f/u.

## 2022-03-05 NOTE — Telephone Encounter (Signed)
Copy given to patient and I have also sent copy to [place]. Patient asked for Rx to be sent to [facility] as well.

## 2022-03-05 NOTE — Telephone Encounter (Signed)
Patient and wife came by office states that blood sugar is going up and down high's over 300 and lows at 52. Patient blood sugar is currently 201 and was given both tresiba and novolog this morning. I have printed out [place] reading.

## 2022-03-05 NOTE — Assessment & Plan Note (Signed)
While npo, reduce insulin by 50%. Gentle IVF overnight. RN reports pt coughing while drinking water. Will need formal ST consult. Wife aware that this eval may be delayed due to holiday weekend.

## 2022-03-05 NOTE — Telephone Encounter (Signed)
Error

## 2022-03-05 NOTE — H&P (Addendum)
History and Physical    William Barron:[id number] DOB: 11-May-1945 DOA: 03/05/2022  DOS: the patient was seen and examined on 03/05/2022  PCP: [name], MD   Patient coming from: Home  I have personally briefly reviewed patient's old medical records in [place]  CC: blurred vision HPI: 76 year old male history of amyloidosis, multiple myeloma, history of prostate cancer, history of pancreatic cancer status post Whipple, dyslipidemia, diabetes insulin requiring presents to the ER today with right-sided headache and blurred vision.  Last known well was yesterday evening 03/04/2022.  Patient states he woke up around 6:00 this morning.  He states that he had difficulty seeing out of his right eye.  He went to see an ophthalmologist who referred him to the ER.  CT scan of the head in the ER demonstrated a acute/subacute right occipital stroke.  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  Patient's been seen by neurology.  [facility] hospitalist contacted for admission.  Bedside RN states that he has failed his bedside swallow examination by coughing while drinking water.  Denies any chest pain, shortness of breath.  Patient had his feeding tube removed that he had placed for his Whipple about 4 months ago.   ED Course: CT head shows acute/subacute CVA right occipital lobe. Pt not a candidate for TNK due to last known well was yesterday on 03-04-2022.  Review of Systems:  Review of Systems  Constitutional: Negative.   HENT: Negative.    Eyes:  Positive for blurred vision and double vision.  Respiratory: Negative.    Cardiovascular: Negative.   Gastrointestinal: Negative.   Genitourinary: Negative.   Musculoskeletal: Negative.   Skin: Negative.   Neurological:  Positive for headaches.  Endo/Heme/Allergies: Negative.   Psychiatric/Behavioral: Negative.    All other systems reviewed and are negative.   Past Medical History:  Diagnosis Date    Amyloidosis ([place])    Diabetes mellitus without complication ([place])    Feeding tube dysfunction, initial encounter 08/12/2021   GERD (gastroesophageal reflux disease)    Hearing loss    Has hearing aids   History of kidney stones    Hyperlipidemia    Jejunostomy tube present ([place])    Multiple myeloma (HCC)    and amylodosis    Nephrolithiasis    Occasional tremors    Pancreatic insufficiency    Pneumonia    Prostate cancer ([place])    Prostatitis    acute and chronic   Skin cancer     Past Surgical History:  Procedure Laterality Date   APPENDECTOMY     CARDIAC CATHETERIZATION N/A 06/13/2015   Procedure: Left Heart Cath and Coronary Angiography;  Surgeon: Jettie Booze, MD;  Location: [facility];  Service: Cardiovascular;  Laterality: N/A;   COLONOSCOPY  04/07/2000   ELBOW SURGERY  03/15/2001   right   ESOPHAGOGASTRODUODENOSCOPY (EGD) WITH PROPOFOL N/A 09/07/2021   Procedure: ESOPHAGOGASTRODUODENOSCOPY (EGD) WITH PROPOFOL;  Surgeon: Irene Shipper, MD;  Location: [facility];  Service: Gastroenterology;  Laterality: N/A;   EXTRACORPOREAL SHOCK WAVE LITHOTRIPSY Left 07/17/2018   Procedure: EXTRACORPOREAL SHOCK WAVE LITHOTRIPSY (ESWL);  Surgeon: [name], MD;  Location: [facility];  Service: Urology;  Laterality: Left;   EXTRACORPOREAL SHOCK WAVE LITHOTRIPSY Right 12/08/2020   Procedure: RIGHT EXTRACORPOREAL SHOCK WAVE LITHOTRIPSY (ESWL);  Surgeon: Raynelle Bring, MD;  Location[facility];  Service: Urology;  Laterality: Right;   FOOT ARTHRODESIS Right 04/15/2021   Procedure: RIGHT SUBTALAR AND  TALONAVICULAR FUSION;  Surgeon: Newt Minion, MD;  Location: [facility];  Service: Orthopedics;  Laterality: Right;   HERNIA REPAIR     IR [place] REMOV OBSTRUC MAT ANY COLON TUBE W/FLUORO  08/27/2021   IR [place] GASTRO/COLONIC TUBE PERCUT W/FLUORO  09/08/2021   ROTATOR CUFF REPAIR     WHIPPLE PROCEDURE  07/13/2021     reports that he quit smoking about 45 years ago.  His smoking use included cigarettes. He has a 60.00 pack-year smoking history. He has never used smokeless tobacco. He reports current alcohol use of about 3.0 standard drinks of alcohol per week. He reports that he does not use drugs.  No Known Allergies  Family History  Problem Relation Age of Onset   Pancreatic cancer Father    Prostate cancer Father    Parkinsonism Mother    CAD Brother 37   Cancer Paternal Grandmother    Breast cancer Neg Hx    Colon cancer Neg Hx     Prior to Admission medications   Medication Sig Start Date End Date Taking? Authorizing Provider  Cyanocobalamin (B-12 PO) Take 1 tablet by mouth in the morning.   Yes [provider]  insulin aspart (NOVOLOG FLEXPEN) 100 UNIT/ML FlexPen Inject 8 Units into the skin 3 (three) times daily before meals.   Yes [provider]  lipase/protease/amylase (CREON) 36000 UNITS CPEP capsule Take 3 capsules (108,000 Units total) by mouth 3 (three) times daily with meals. May also take 1 capsule (36,000 Units total) as needed (with snacks). Patient taking differently: Take 2-3 capsules (72000-108000 units) by mouth 3 (three) times daily with meals. May also take 1 capsule (36,000 units) as needed (with snacks). 01/14/22  Yes Gatha Mayer, MD  pantoprazole sodium (PROTONIX) 40 mg Take 40 mg by mouth daily. Patient taking differently: Take 40 mg by mouth in the morning. 01/14/22  Yes Gatha Mayer, MD  Testosterone 20.25 MG/ACT (1.62%) GEL Apply 4 Pump topically at bedtime.   Yes [provider]  Vibegron (GEMTESA) 75 MG TABS Take 75 mg by mouth in the morning.   Yes [provider]  BD PEN NEEDLE NANO U/F 32G X 4 MM MISC USE AS DIRECTED WITH INSULIN PEN THREE TIMES DAILY 01/27/22   Elayne Snare, MD  Continuous Blood Gluc Receiver (FREESTYLE LIBRE 2 READER) DEVI Use as instructed to check blood sugars 11/20/21   Elayne Snare, MD  Continuous Blood Gluc Sensor (FREESTYLE LIBRE 3 SENSOR) MISC Place 1  sensor on the skin every 14 days. Use to check glucose continuously 03/05/22   Elayne Snare, MD  insulin degludec (TRESIBA FLEXTOUCH) 100 UNIT/ML FlexTouch Pen Inject 15 Units into the skin daily. Patient taking differently: Inject 6 Units into the skin in the morning. 11/17/21   Elayne Snare, MD    Physical Exam: Vitals:   03/05/22 2000 03/05/22 2006 03/05/22 2015 03/05/22 2130  BP: (!) 148/77  (!) 148/74 (!) 149/86  Pulse: 81  82 85  Resp: 20  (!) 24 20  Temp:  99.1 F (37.3 C)    TempSrc:  Oral    SpO2: 97%  97% 100%  Weight:      Height:        Physical Exam Vitals and nursing note reviewed.  Constitutional:      General: He is not in acute distress.    Appearance: Normal appearance. He is not ill-appearing, toxic-appearing or diaphoretic.  HENT:     Head: Normocephalic and atraumatic.  Nose: Nose normal.  Eyes:     General: No scleral icterus.    Extraocular Movements:     Right eye: Abnormal extraocular motion present.     Comments: Right LR palsy. Right eye with esotropia  Cardiovascular:     Rate and Rhythm: Normal rate and regular rhythm.     Heart sounds: Murmur heard.  Pulmonary:     Effort: Pulmonary effort is normal. No respiratory distress.     Breath sounds: Normal breath sounds. No wheezing or rales.  Abdominal:     General: Abdomen is flat. Bowel sounds are normal. There is no distension.     Palpations: Abdomen is soft.     Tenderness: There is no abdominal tenderness. There is no guarding.  Musculoskeletal:     Right lower leg: No edema.     Left lower leg: No edema.  Skin:    General: Skin is warm and dry.     Capillary Refill: Capillary refill takes less than 2 seconds.  Neurological:     Mental Status: He is alert and oriented to person, place, and time.     Comments: Right CN6 palsy      Labs on Admission: I have personally reviewed following labs and imaging studies  CBC: Recent Labs  Lab 03/05/22 1632 03/05/22 1725  WBC 7.0  --    NEUTROABS 5.2  --   HGB 10.4* 11.9*  HCT 34.1* 35.0*  MCV 78.4*  --   PLT 419*  --    Basic Metabolic Panel: Recent Labs  Lab 03/05/22 1632 03/05/22 1725  NA 139 141  K 3.2* 3.3*  CL 106 104  CO2 23  --   GLUCOSE 128* 120*  BUN 13 13  CREATININE 0.98 0.80  CALCIUM 9.1  --    GFR: Estimated Creatinine Clearance: 66.2 mL/min (by C-G formula based on SCr of 0.8 mg/dL). Liver Function Tests: Recent Labs  Lab 03/05/22 1632  AST 39  ALT 33  ALKPHOS 727*  BILITOT 0.4  PROT 7.1  ALBUMIN 3.5   No results for input(s): "LIPASE", "AMYLASE" in the last 168 hours. No results for input(s): "AMMONIA" in the last 168 hours. Coagulation Profile: Recent Labs  Lab 03/05/22 1632  INR 1.2   Cardiac Enzymes: No results for input(s): "CKTOTAL", "CKMB", "CKMBINDEX", "TROPONINI", "TROPONINIHS" in the last 168 hours. BNP (last 3 results) No results for input(s): "PROBNP" in the last 8760 hours. HbA1C: No results for input(s): "HGBA1C" in the last 72 hours. CBG: Recent Labs  Lab 03/05/22 1759 03/05/22 1905  GLUCAP 94 75   Lipid Profile: No results for input(s): "CHOL", "HDL", "LDLCALC", "TRIG", "CHOLHDL", "LDLDIRECT" in the last 72 hours. Thyroid Function Tests: No results for input(s): "TSH", "T4TOTAL", "FREET4", "T3FREE", "THYROIDAB" in the last 72 hours. Anemia Panel: No results for input(s): "VITAMINB12", "FOLATE", "FERRITIN", "TIBC", "IRON", "RETICCTPCT" in the last 72 hours. Urine analysis:    Component Value Date/Time   COLORURINE YELLOW 08/11/2021 2053   APPEARANCEUR CLEAR 08/11/2021 2053   LABSPEC 1.023 08/11/2021 2053   PHURINE 5.0 08/11/2021 2053   GLUCOSEU NEGATIVE 08/11/2021 2053   HGBUR NEGATIVE 08/11/2021 2053   BILIRUBINUR NEGATIVE 08/11/2021 2053   [place] NEGATIVE 08/11/2021 2053   PROTEINUR NEGATIVE 08/11/2021 2053   UROBILINOGEN 0.2 10/15/2014 0633   NITRITE NEGATIVE 08/11/2021 2053   LEUKOCYTESUR NEGATIVE 08/11/2021 2053    Radiological Exams  on Admission: I have personally reviewed images CT ANGIO HEAD NECK W WO CM  Result Date: 03/05/2022 CLINICAL  DATA:  [place] EXAM: CT ANGIOGRAPHY HEAD AND NECK TECHNIQUE: Multidetector CT imaging of the head and neck was performed using the standard protocol during bolus administration of intravenous contrast. Multiplanar CT image reconstructions and MIPs were obtained to evaluate the vascular anatomy. Carotid stenosis measurements (when applicable) are obtained utilizing NASCET criteria, using the distal internal carotid diameter as the denominator. RADIATION DOSE REDUCTION: This exam was performed according to the departmental dose-optimization program which includes automated exposure control, adjustment of the mA and/or kV according to patient size and/or use of iterative reconstruction technique. CONTRAST:  92m OMNIPAQUE IOHEXOL 350 MG/ML SOLN COMPARISON:  CT head 12/07/2021. FINDINGS: CT HEAD FINDINGS Brain: Acute or early subacute infarct in the right occipital lobe (PCA territory). Mild sulcal effacement is region without significant mass effect. No midline shift. No evidence of acute hemorrhage, mass lesion, or hydrocephalus. Vascular: See below. Skull: No acute fracture. Sinuses/Orbits: Clear sinuses.  No acute orbital findings. Other: No mastoid effusions. Review of the MIP images confirms the above findings CTA NECK FINDINGS Aortic arch: Great vessel origins are patent. Right carotid system: Patent. Calcific atherosclerosis at the carotid bifurcation without significant stenosis. Left carotid system: Patent.  No significant stenosis. Vertebral arteries: Left dominant. Both vertebral arteries are patent without significant stenosis. Skeleton: Multilevel degenerative change. Other neck: Approximately 1.6 cm partially calcified lesion in the posterior aspect of the right parotid gland with multiple surrounding smaller lesions. Upper chest: Limited motion limited assessment without consolidation. Review  of the MIP images confirms the above findings CTA HEAD FINDINGS Anterior circulation: Bilateral intracranial ICAs, MCAs, and ACAs are patent without proximal high-grade stenosis. Posterior circulation: Bilateral intradural vertebral arteries, and basilar artery are patent without significant stenosis. Proximally, the post rib arteries are patent. Suspected high-grade stenosis versus occlusion of the distal (mid to distal P3) right PCA (for example see series 15, image 22). Venous sinuses: As permitted by contrast timing, patent. Review of the MIP images confirms the above findings IMPRESSION: 1. Acute or early subacute infarct in the right occipital lobe (PCA territory). 2. Suspected high-grade stenosis versus occlusion of the distal (mid to distal P3) right PCA (for example see series 15, image 22), in the region of infarct described above. 3. Approximately 1.6 cm partially calcified lesion in the posterior aspect of the right parotid gland with multiple surrounding smaller lesions. These could potentially represent primary parotid neoplasms (either benign or malignant) or enlarged lymph nodes. Recommend outpatient ENT follow-up for management. Findings discussed with provider [place] via telephone at 7:15 p.m. Electronically Signed   By: FMargaretha SheffieldM.D.   On: 03/05/2022 19:16    EKG: My personal interpretation of EKG shows: NSR    Assessment/Plan Principal Problem:   Occipital stroke (HCC) Active Problems:   Amyloidosis ([place]   Multiple myeloma (HCC)   Dyslipidemia   H/O Whipple procedure   Insulin-requiring or dependent type II diabetes mellitus ([facility]   Lesion of parotid gland    Assessment and Plan: * Occipital stroke ([facility] Observation med/tele bed. Neurology has been consulted. Check echo, lipid panel. Defer rest of imaging orders to neurology consult. Hold on heparin/lovenox for first 24 hours given his occipital CVA. Monitor for hemorrhagic transformation prior to starting  heparin/lovenox. Allow for permissive HTN(SBP <220 and DBP <110). Defer antiplatelet management to neurology.  Lesion of parotid gland Stable. Need outpatient f/u.  Insulin-requiring or dependent type II diabetes mellitus ([facility] While npo, reduce insulin by 50%. Gentle IVF overnight. RN reports pt coughing while drinking water.  Will need formal ST consult. Wife aware that this eval may be delayed due to holiday weekend.  H/O Whipple procedure Stable. Hx of pancreatic cancer. S/p whipple in 07-2021.  Dyslipidemia Check lipid panel  Multiple myeloma (HCC) Chronic. Stable.  Amyloidosis (HCC) Chronic. Stable.   DVT prophylaxis: SCDs Code Status: Full Code Family Communication: discussed with pt and his wife rebecca  Disposition Plan: return home  Consults called: EDP has consulted neurology  Admission status: Observation, Telemetry bed   Kristopher Oppenheim, DO [facility] 03/05/2022, 10:03 PM

## 2022-03-05 NOTE — Assessment & Plan Note (Signed)
Stable. Hx of pancreatic cancer. S/p whipple in 07-2021.

## 2022-03-05 NOTE — ED Provider Triage Note (Signed)
Emergency Medicine Provider Triage Evaluation Note  William Barron , a 76 y.o. male  was evaluated in triage.  Pt complains of changes in vision, states he was unable to see the clock this morning and "half of his vision is gone".  Patient went to ophthalmology across the street who advised patient to proceed to ED for concerns that patient had a stroke.  Last known well was 2230 yesterday.  Woke at 0600 this morning with a severe headache.    Review of Systems  Positive: As above Negative: As above  Physical Exam  BP (!) 149/71 (BP Location: Right Arm)   Pulse 85   Temp 99.8 F (37.7 C) (Oral)   Resp 16   SpO2 95%  Gen:   Awake, no distress   Resp:  Normal effort  MSK:   Moves extremities without difficulty  Other:  No facial weakness or palsy, speech normal, no unilateral weakness, grip strength equal, sensation intact bilaterally, esotropia of right eye  Medical Decision Making  Medically screening exam initiated at 4:10 PM.  Appropriate orders placed.  William Barron was informed that the remainder of the evaluation will be completed by another provider, this initial triage assessment does not replace that evaluation, and the importance of remaining in the ED until their evaluation is complete.     William Barron, [place] 03/05/22 [phone]

## 2022-03-05 NOTE — ED Triage Notes (Signed)
Pt states he woke up this morning with a right sided headache. When he looked at the clock, he was unable to see the number on the left. Pt states he continues to have headache. Pt went to ophthalmologist today and was sent here for stroke like symptoms.

## 2022-03-05 NOTE — Subjective & Objective (Signed)
CC: blurred vision HPI: 76 year old male history of amyloidosis, multiple myeloma, history of prostate cancer, history of pancreatic cancer status post Whipple, dyslipidemia, diabetes insulin requiring presents to the ER today with right-sided headache and blurred vision.  Last known well was yesterday evening 03/04/2022.  Patient states he woke up around 6:00 this morning.  He states that he had difficulty seeing out of his right eye.  He went to see an ophthalmologist who referred him to the ER.  CT scan of the head in the ER demonstrated a acute/subacute right occipital stroke.  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  Patient's been seen by neurology.  [facility] hospitalist contacted for admission.  Bedside RN states that he has failed his bedside swallow examination by coughing while drinking water.  Denies any chest pain, shortness of breath.  Patient had his feeding tube removed that he had placed for his Whipple about 4 months ago.

## 2022-03-05 NOTE — Assessment & Plan Note (Signed)
Check lipid panel  

## 2022-03-05 NOTE — Assessment & Plan Note (Addendum)
Observation med/tele bed. Neurology has been consulted. Check echo, lipid panel. Defer rest of imaging orders to neurology consult. Hold on heparin/lovenox for first 24 hours given his occipital CVA. Monitor for hemorrhagic transformation prior to starting heparin/lovenox. Allow for permissive HTN(SBP <220 and DBP <110). Defer antiplatelet management to neurology.

## 2022-03-05 NOTE — Consult Note (Addendum)
Neurology Consultation Reason for Consult: Stroke Referring Physician: Truett Mainland, W  CC: Vision problems  History is obtained from: Patient  HPI: William Barron is a 76 y.o. male who was last known well on the night of 12/21.  He awoke on the morning of 12/22 and noticed that he was having difficulty seeing half of the clock.  He could see the minutes but not the hour (hour was on the left).  This continued and therefore he went to his ophthalmologist who told him that he thought he had had a stroke and referred him into the emergency department.  In the ER head CT was performed which demonstrates a right PCA territory stroke.  Of note, he has had what he calls " foot drop" for multiple months, and in fact wears a brace.  He states that it is really his whole leg that is weak.  LKW: 12/21 prior to bed tpa given?: no, outside of window  Past Medical History:  Diagnosis Date   Amyloidosis ([place])    Diabetes mellitus without complication ([employer])    Feeding tube dysfunction, initial encounter 08/12/2021   GERD (gastroesophageal reflux disease)    Hearing loss    Has hearing aids   History of kidney stones    Hyperlipidemia    Jejunostomy tube present (HCC)    Multiple myeloma (HCC)    and amylodosis    Nephrolithiasis    Occasional tremors    Pancreatic insufficiency    Pneumonia    Prostate cancer ([place])    Prostatitis    acute and chronic   Skin cancer      Family History  Problem Relation Age of Onset   Pancreatic cancer Father    Prostate cancer Father    Parkinsonism Mother    CAD Brother 51   Cancer Paternal Grandmother    Breast cancer Neg Hx    Colon cancer Neg Hx      Social History:  reports that he quit smoking about 45 years ago. His smoking use included cigarettes. He has a 60.00 pack-year smoking history. He has never used smokeless tobacco. He reports current alcohol use of about 3.0 standard drinks of alcohol per week. He reports that he does not use  drugs.   Exam: Current vital signs: BP (!) 149/86   Pulse 85   Temp 99.1 F (37.3 C) (Oral)   Resp 20   Ht _0  (1.651 m)   Wt 63.5 kg   SpO2 100%   BMI 23.30 kg/m  Vital signs in last 24 hours: Temp:  [99.1 F (37.3 C)-99.8 F (37.7 C)] 99.1 F (37.3 C) (12/22 2006) Pulse Rate:  [80-85] 85 (12/22 2130) Resp:  [11-24] 20 (12/22 2130) BP: (148-156)/(71-86) 149/86 (12/22 2130) SpO2:  [95 %-100 %] 100 % (12/22 2130) Weight:  [63.5 kg] 63.5 kg (12/22 1619)   Physical Exam  Constitutional: Appears well-developed and well-nourished.  Psych: Affect appropriate to situation Eyes: No scleral injection HENT: No OP obstruction MSK: no joint deformities.  Cardiovascular: Normal rate and regular rhythm.  Respiratory: Effort normal, non-labored breathing GI: Soft.  No distension. There is no tenderness.  Skin: WDI  Neuro: Mental Status: Patient is awake, alert, oriented to person, place, month, year, and situation. Patient is able to give a clear and coherent history. No signs of aphasia or neglect Cranial Nerves: II: He has significant difficulty counting fingers in the left hemifield, but he is able to identify movement.. Pupils are equal, round, and  reactive to light.   III,IV, VI: some difficulty with right eye abduction V: Facial sensation is symmetric to temperature VII: Facial movement with mildly decreased nasolabial fold on the right VIII: hearing is intact to voice X: Uvula elevates symmetrically XI: Shoulder shrug is symmetric. XII: tongue is midline without atrophy or fasciculations.  Motor: Tone is normal. Bulk is normal. 5/5 strength was present on the left, on the right he has mild right pronator drift with 4+/5 strength, 4+/5 strength of the right upper leg and 4/5 strength at the ankle Sensory: Sensation is symmetric to light touch and temperature in the arms and legs. Cerebellar: No clear ataxia     I have reviewed labs in epic and the results  pertinent to this consultation are: LDL 66  I have reviewed the images obtained: CTA head and neck-right PCA stroke  Impression: 76 year old male with right PCA stroke.  I do suspect that he may have had a previous stroke as well given some weakness of both the right arm and leg.  He will need to be admitted for secondary risk factor modification as well as therapy evaluation.  He failed his bedside swallow, and therefore I would do aspirin suppository, but I would use dual antiplatelet therapy once he is able to swallow.  Recommendations: - HgbA1c - MRI of the brain without contrast - Frequent neuro checks - Echocardiogram - Prophylactic therapy-aspirin 300 mg PR, would start dual antiplatelet therapy once cleared by speech therapy. - Telemetry monitoring - PT consult, OT consult, Speech consult - Stroke team to follow    Roland Rack, MD [facility] [phone]  If 7pm- 7am, please page neurology on call as listed in [employer].

## 2022-03-05 NOTE — ED Notes (Signed)
Wife requesting CBG to be checked, states phone reading is 54. Repeat CBG 75.

## 2022-03-05 NOTE — ED Provider Notes (Addendum)
[facility] EMERGENCY DEPARTMENT Provider Note  CSN: [id number] Arrival date & time: 03/05/22 1536  Chief Complaint(s) Headache  HPI William Barron is a 76 y.o. male with history of multiple myeloma, amyloidosis, prostate cancer, hypertension, hyperlipidemia, diabetes presenting to the emergency department with vision change and headache.  He reports that this morning woke up with a headache.  He reports seeing double vision and reports his vision is "changed ".  He denies any numbness or tingling.  He also reports that his swallowing feels weird.  No fevers or chills.  No chest pain or shortness of breath.  No abdominal pain.  No numbness or tingling, no weakness.   Past Medical History Past Medical History:  Diagnosis Date   Amyloidosis ([place])    Diabetes mellitus without complication ([place])    GERD (gastroesophageal reflux disease)    Hearing loss    Has hearing aids   History of kidney stones    Hyperlipidemia    Multiple myeloma ([place])    and amylodosis    Nephrolithiasis    Occasional tremors    Pancreatic insufficiency    Pneumonia    Prostate cancer ([place])    Prostatitis    acute and chronic   Skin cancer    Patient Active Problem List   Diagnosis Date Noted   Leg swelling 01/20/2022   Nonrheumatic aortic valve stenosis 01/20/2022   Abnormal CT of the abdomen    Pressure injury of skin 09/05/2021   Goals of care, counseling/discussion 09/03/2021   Postoperative anemia due to acute blood loss 08/31/2021   Reactive thrombocytosis 08/31/2021   Hypokalemia 08/31/2021   Malnutrition of moderate degree 08/19/2021   Debility 08/19/2021   Jejunostomy tube present ([place])    Asymptomatic bacteriuria 08/15/2021   H/O Whipple procedure 08/13/2021   Chronic hiccups 08/13/2021   Candida esophagitis ([place]) 08/13/2021   Diabetes ([place]) 08/13/2021   Anemia 08/13/2021   Hypoalbuminemia 08/13/2021   Frequent PVCs 08/13/2021   Prolonged QT interval  08/13/2021   Facial droop 08/13/2021   Hormone replacement therapy 08/13/2021   Lesion of parotid gland 08/13/2021   Protein-calorie malnutrition, severe ([place]) 08/13/2021   Feeding difficulties 08/12/2021   Feeding tube dysfunction, initial encounter 08/12/2021   Posterior tibial tendon dysfunction, right 04/15/2021   Posterior tibial tendinitis, right leg    Prostate cancer ([place]) 02/11/2020   Orthostatic tremor 10/01/2019   Educated about COVID-19 virus infection 09/12/2019   Dyslipidemia 09/12/2019   Nonrheumatic pulmonary valve insufficiency 09/12/2019   Red blood cell antibody positive, compatible PRBC difficult to obtain 02/15/2018   Diplopia 01/03/2018   Benign essential tremor 01/03/2018   Pain in joint of right ankle 05/17/2017   Foot pain 05/17/2017   Peroneal tendinitis 05/17/2017   Depression with anxiety 01/03/2017   Insomnia 05/05/2016   Coronary artery calcification seen on CAT scan    DOE (dyspnea on exertion)    Fatigue 02/27/2015   Elevated homocysteine 09/26/2014   Multiple myeloma ([place]) 09/26/2014   Acquired pes planus of right foot 07/02/2014   Posterior tibial tendon dysfunction 07/02/2014   Encounter for antineoplastic chemotherapy 01/02/2014   Kahler disease ([place]) 01/02/2014   Avitaminosis D 01/02/2014   Amyloidosis ([place]) 03/26/2013   Enlarged lymph node 02/14/2013   Pure hypercholesterolemia 03/29/2011   Memory disorder 09/21/2010   Labile hypertension 09/21/2010   Home Medication(s) Prior to Admission medications   Medication Sig Start Date End Date Taking? Authorizing Provider  Cyanocobalamin (B-12 PO) Take 1 tablet  by mouth in the morning.   Yes [provider]  insulin aspart (NOVOLOG FLEXPEN) 100 UNIT/ML FlexPen Inject 8 Units into the skin 3 (three) times daily before meals.   Yes [provider]  lipase/protease/amylase (CREON) 36000 UNITS CPEP capsule Take 3 capsules (108,000 Units total) by mouth 3 (three) times daily with  meals. May also take 1 capsule (36,000 Units total) as needed (with snacks). Patient taking differently: Take 2-3 capsules (72000-108000 units) by mouth 3 (three) times daily with meals. May also take 1 capsule (36,000 units) as needed (with snacks). 01/14/22  Yes Gatha Mayer, MD  pantoprazole sodium (PROTONIX) 40 mg Take 40 mg by mouth daily. Patient taking differently: Take 40 mg by mouth in the morning. 01/14/22  Yes Gatha Mayer, MD  Testosterone 20.25 MG/ACT (1.62%) GEL Apply 4 Pump topically at bedtime.   Yes [provider]  Vibegron (GEMTESA) 75 MG TABS Take 75 mg by mouth in the morning.   Yes [provider]  BD PEN NEEDLE NANO U/F 32G X 4 MM MISC USE AS DIRECTED WITH INSULIN PEN THREE TIMES DAILY 01/27/22   Elayne Snare, MD  Continuous Blood Gluc Receiver (FREESTYLE LIBRE 2 READER) DEVI Use as instructed to check blood sugars 11/20/21   Elayne Snare, MD  Continuous Blood Gluc Sensor (FREESTYLE LIBRE 3 SENSOR) MISC Place 1 sensor on the skin every 14 days. Use to check glucose continuously 03/05/22   Elayne Snare, MD  insulin degludec (TRESIBA FLEXTOUCH) 100 UNIT/ML FlexTouch Pen Inject 15 Units into the skin daily. Patient taking differently: Inject 6 Units into the skin in the morning. 11/17/21   Elayne Snare, MD                                                                                                                                    Past Surgical History Past Surgical History:  Procedure Laterality Date   APPENDECTOMY     CARDIAC CATHETERIZATION N/A 06/13/2015   Procedure: Left Heart Cath and Coronary Angiography;  Surgeon: Jettie Booze, MD;  Location: [place] [facility];  Service: Cardiovascular;  Laterality: N/A;   COLONOSCOPY  04/07/2000   ELBOW SURGERY  03/15/2001   right   ESOPHAGOGASTRODUODENOSCOPY (EGD) WITH PROPOFOL N/A 09/07/2021   Procedure: ESOPHAGOGASTRODUODENOSCOPY (EGD) WITH PROPOFOL;  Surgeon: Irene Shipper, MD;  Location: [place];   Service: Gastroenterology;  Laterality: N/A;   EXTRACORPOREAL SHOCK WAVE LITHOTRIPSY Left 07/17/2018   Procedure: EXTRACORPOREAL SHOCK WAVE LITHOTRIPSY (ESWL);  Surgeon: [name], MD;  Location: [facility];  Service: Urology;  Laterality: Left;   EXTRACORPOREAL SHOCK WAVE LITHOTRIPSY Right 12/08/2020   Procedure: RIGHT EXTRACORPOREAL SHOCK WAVE LITHOTRIPSY (ESWL);  Surgeon: Raynelle Bring, MD;  [facility];  Service: Urology;  Laterality: Right;   FOOT ARTHRODESIS Right 04/15/2021   Procedure: RIGHT SUBTALAR AND TALONAVICULAR FUSION;  Surgeon: Newt Minion, MD;  Location: [place];  Service: Orthopedics;  Laterality: Right;   HERNIA REPAIR     IR MECH REMOV OBSTRUC MAT ANY COLON TUBE W/FLUORO  08/27/2021   IR [place] GASTRO/COLONIC TUBE PERCUT W/FLUORO  09/08/2021   ROTATOR CUFF REPAIR     WHIPPLE PROCEDURE  07/13/2021   Family History Family History  Problem Relation Age of Onset   Pancreatic cancer Father    Prostate cancer Father    Parkinsonism Mother    CAD Brother 58   Cancer Paternal Grandmother    Breast cancer Neg Hx    Colon cancer Neg Hx     Social History Social History   Tobacco Use   Smoking status: Former    Packs/day: 3.00    Years: 20.00    Total pack years: 60.00    Types: Cigarettes    Quit date: 03/15/1977    Years since quitting: 45.0   Smokeless tobacco: Never   Tobacco comments:    quit 35 years ago  Vaping Use   Vaping Use: Never used  Substance Use Topics   Alcohol use: Yes    Alcohol/week: 3.0 standard drinks of alcohol    Types: 3 Glasses of wine per week    Comment: approx 1 drink/night   Drug use: No   Allergies Patient has no known allergies.  Review of Systems Review of Systems  All other systems reviewed and are negative.   Physical Exam Vital Signs  I have reviewed the triage vital signs BP (!) 156/80   Pulse 80   Temp 99.8 F (37.7 C) (Oral)   Resp 13   Ht _0  (1.651 m)   Wt 63.5 kg   SpO2 99%    BMI 23.30 kg/m  Physical Exam Vitals and nursing note reviewed.  Constitutional:      General: He is not in acute distress.    Appearance: Normal appearance.  HENT:     Mouth/Throat:     Mouth: Mucous membranes are moist.  Eyes:     Conjunctiva/sclera: Conjunctivae normal.  Cardiovascular:     Rate and Rhythm: Normal rate and regular rhythm.  Pulmonary:     Effort: Pulmonary effort is normal. No respiratory distress.     Breath sounds: Normal breath sounds.  Abdominal:     General: Abdomen is flat.     Palpations: Abdomen is soft.     Tenderness: There is no abdominal tenderness.  Musculoskeletal:     Right lower leg: No edema.     Left lower leg: No edema.  Skin:    General: Skin is warm and dry.     Capillary Refill: Capillary refill takes less than 2 seconds.  Neurological:     Mental Status: He is alert and oriented to person, place, and time.     Comments: Cranial nerves II through XII intact with the exception of possible left visual field cut, right cranial nerve VI palsy and dilated right pupil not reactive, strength 5 out of 5 in the bilateral upper and lower extremities, no sensory deficit to light touch, no dysmetria on finger-nose-finger testing when patient covers one of his eyes  Psychiatric:        Mood and Affect: Mood normal.        Behavior: Behavior normal.     ED Results and Treatments Labs (all labs ordered are listed, but only abnormal results are displayed) Labs Reviewed  CBC - Abnormal; Notable for the following components:      Result Value  Hemoglobin 10.4 (*)    HCT 34.1 (*)    MCV 78.4 (*)    MCH 23.9 (*)    RDW 18.2 (*)    Platelets 419 (*)    All other components within normal limits  COMPREHENSIVE METABOLIC PANEL - Abnormal; Notable for the following components:   Potassium 3.2 (*)    Glucose, Bld 128 (*)    Alkaline Phosphatase 727 (*)    All other components within normal limits  I-STAT CHEM 8, ED - Abnormal; Notable for the  following components:   Potassium 3.3 (*)    Glucose, Bld 120 (*)    Hemoglobin 11.9 (*)    HCT 35.0 (*)    All other components within normal limits  PROTIME-INR  APTT  DIFFERENTIAL  SEDIMENTATION RATE  C-REACTIVE PROTEIN  ETHANOL  RAPID URINE DRUG SCREEN, HOSP PERFORMED  URINALYSIS, ROUTINE W REFLEX MICROSCOPIC  CBG MONITORING, ED  CBG MONITORING, ED                                                                                                                          Radiology CT ANGIO HEAD NECK W WO CM  Result Date: 03/05/2022 CLINICAL DATA:  Diplopia EXAM: CT ANGIOGRAPHY HEAD AND NECK TECHNIQUE: Multidetector CT imaging of the head and neck was performed using the standard protocol during bolus administration of intravenous contrast. Multiplanar CT image reconstructions and MIPs were obtained to evaluate the vascular anatomy. Carotid stenosis measurements (when applicable) are obtained utilizing NASCET criteria, using the distal internal carotid diameter as the denominator. RADIATION DOSE REDUCTION: This exam was performed according to the departmental dose-optimization program which includes automated exposure control, adjustment of the mA and/or kV according to patient size and/or use of iterative reconstruction technique. CONTRAST:  5m OMNIPAQUE IOHEXOL 350 MG/ML SOLN COMPARISON:  CT head 12/07/2021. FINDINGS: CT HEAD FINDINGS Brain: Acute or early subacute infarct in the right occipital lobe (PCA territory). Mild sulcal effacement is region without significant mass effect. No midline shift. No evidence of acute hemorrhage, mass lesion, or hydrocephalus. Vascular: See below. Skull: No acute fracture. Sinuses/Orbits: Clear sinuses.  No acute orbital findings. Other: No mastoid effusions. Review of the MIP images confirms the above findings CTA NECK FINDINGS Aortic arch: Great vessel origins are patent. Right carotid system: Patent. Calcific atherosclerosis at the carotid bifurcation  without significant stenosis. Left carotid system: Patent.  No significant stenosis. Vertebral arteries: Left dominant. Both vertebral arteries are patent without significant stenosis. Skeleton: Multilevel degenerative change. Other neck: Approximately 1.6 cm partially calcified lesion in the posterior aspect of the right parotid gland with multiple surrounding smaller lesions. Upper chest: Limited motion limited assessment without consolidation. Review of the MIP images confirms the above findings CTA HEAD FINDINGS Anterior circulation: Bilateral intracranial ICAs, MCAs, and ACAs are patent without proximal high-grade stenosis. Posterior circulation: Bilateral intradural vertebral arteries, and basilar artery are patent without significant stenosis. Proximally, the post rib arteries are patent. Suspected high-grade stenosis versus  occlusion of the distal (mid to distal P3) right PCA (for example see series 15, image 22). Venous sinuses: As permitted by contrast timing, patent. Review of the MIP images confirms the above findings IMPRESSION: 1. Acute or early subacute infarct in the right occipital lobe (PCA territory). 2. Suspected high-grade stenosis versus occlusion of the distal (mid to distal P3) right PCA (for example see series 15, image 22), in the region of infarct described above. 3. Approximately 1.6 cm partially calcified lesion in the posterior aspect of the right parotid gland with multiple surrounding smaller lesions. These could potentially represent primary parotid neoplasms (either benign or malignant) or enlarged lymph nodes. Recommend outpatient ENT follow-up for management. Findings discussed with provider Reba Hulett via telephone at 7:15 p.m. Electronically Signed   By: Margaretha Sheffield M.D.   On: 03/05/2022 19:16    Pertinent labs & imaging results that were available during my care of the patient were reviewed by me and considered in my medical decision making (see MDM for  details).  Medications Ordered in ED Medications  morphine (PF) 4 MG/ML injection 4 mg (has no administration in time range)  ondansetron (ZOFRAN) injection 4 mg (has no administration in time range)  potassium chloride 10 mEq in 100 mL IVPB (has no administration in time range)  iohexol (OMNIPAQUE) 350 MG/ML injection 75 mL (75 mLs Intravenous Contrast Given 03/05/22 1834)                                                                                                                                     Procedures .Critical Care  Performed by: Cristie Hem, MD Authorized by: Cristie Hem, MD   Critical care provider statement:    Critical care time (minutes):  30   Critical care was necessary to treat or prevent imminent or life-threatening deterioration of the following conditions:  CNS failure or compromise   Critical care was time spent personally by me on the following activities:  Development of treatment plan with patient or surrogate, discussions with consultants, examination of patient, ordering and review of laboratory studies, ordering and review of radiographic studies, pulse oximetry, re-evaluation of patient's condition and review of old charts   Care discussed with: admitting provider     (including critical care time)  Medical Decision Making / ED Course   MDM:  76 year old male presenting to the emergency department with headache and vision change.  On exam, patient has a cranial nerve VI palsy and dilated pupil on the right and possible left-sided visual field deficit.  Differential includes intracranial bleeding, aneurysm, tumor, acute ischemic stroke.  CTA head and neck performed which demonstrates distal P3 occlusion and occipital stroke.  Will order MRI brain.  Will consult with neurology.  tPA considered the patient outside of the window as he woke up with the symptoms.  He does not have an LVO for thrombectomy.  Clinical Course as of  03/05/22  1950  Fri Mar 05, 2022  1941 Discussed with Dr. Leonel Ramsay who will consult.  He recommends hospitalist admission for acute stroke.  Patient outside window of tPA.  Lesion too small and distant to intervene with thrombectomy. [WS]    Clinical Course User Index [WS] Cristie Hem, MD     Additional history obtained: -Additional history obtained from spouse -External records from outside source obtained and reviewed including: Chart review including previous notes, labs, imaging, consultation notes including oncology visit 03/03/22   Lab Tests: -I ordered, reviewed, and interpreted labs.   The pertinent results include:   Labs Reviewed  CBC - Abnormal; Notable for the following components:      Result Value   Hemoglobin 10.4 (*)    HCT 34.1 (*)    MCV 78.4 (*)    MCH 23.9 (*)    RDW 18.2 (*)    Platelets 419 (*)    All other components within normal limits  COMPREHENSIVE METABOLIC PANEL - Abnormal; Notable for the following components:   Potassium 3.2 (*)    Glucose, Bld 128 (*)    Alkaline Phosphatase 727 (*)    All other components within normal limits  I-STAT CHEM 8, ED - Abnormal; Notable for the following components:   Potassium 3.3 (*)    Glucose, Bld 120 (*)    Hemoglobin 11.9 (*)    HCT 35.0 (*)    All other components within normal limits  PROTIME-INR  APTT  DIFFERENTIAL  SEDIMENTATION RATE  C-REACTIVE PROTEIN  ETHANOL  RAPID URINE DRUG SCREEN, HOSP PERFORMED  URINALYSIS, ROUTINE W REFLEX MICROSCOPIC  CBG MONITORING, ED  CBG MONITORING, ED    Notable for mild anemia, mild hypokalemia.   Imaging Studies ordered: I ordered imaging studies including CTA head and neck On my interpretation imaging demonstrates acute ischemic stroke I independently visualized and interpreted imaging. I agree with the radiologist interpretation   Medicines ordered and prescription drug management: Meds ordered this encounter  Medications   DISCONTD: acetaminophen  (TYLENOL) tablet 1,000 mg   iohexol (OMNIPAQUE) 350 MG/ML injection 75 mL   morphine (PF) 4 MG/ML injection 4 mg   ondansetron (ZOFRAN) injection 4 mg   potassium chloride 10 mEq in 100 mL IVPB    -I have reviewed the patients home medicines and have made adjustments as needed   Consultations Obtained: I requested consultation with the neurologist,  and discussed lab and imaging findings as well as pertinent plan - they recommend: Admit for stroke workup   Cardiac Monitoring: The patient was maintained on a cardiac monitor.  I personally viewed and interpreted the cardiac monitored which showed an underlying rhythm of: Normal sinus rhythm  Social Determinants of Health:  Diagnosis or treatment significantly limited by social determinants of health: former smoker   Reevaluation: After the interventions noted above, I reevaluated the patient and found that they have stayed the same  Co morbidities that complicate the patient evaluation  Past Medical History:  Diagnosis Date   Amyloidosis ([place])    Diabetes mellitus without complication ([place])    GERD (gastroesophageal reflux disease)    Hearing loss    Has hearing aids   History of kidney stones    Hyperlipidemia    Multiple myeloma ([place])    and amylodosis    Nephrolithiasis    Occasional tremors    Pancreatic insufficiency    Pneumonia    Prostate cancer ([place])    Prostatitis    acute and  chronic   Skin cancer       Dispostion: Disposition decision including need for hospitalization was considered, and patient admitted to the hospital.    Final Clinical Impression(s) / ED Diagnoses Final diagnoses:  Acute ischemic stroke [facility])     This chart was dictated using voice recognition software.  Despite best efforts to proofread,  errors can occur which can change the documentation meaning.    Cristie Hem, MD 03/05/22 1950    Cristie Hem, MD 03/05/22 2001

## 2022-03-05 NOTE — Telephone Encounter (Signed)
Patient is here today to pick up letter for [facility] to give patient  FreeStyle Libre 3 Sensor Continuous Blood Gluc Sensor (FREESTYLE LIBRE 3 SENSOR) MISC

## 2022-03-06 ENCOUNTER — Observation Stay (HOSPITAL_BASED_OUTPATIENT_CLINIC_OR_DEPARTMENT_OTHER): Payer: No Typology Code available for payment source

## 2022-03-06 DIAGNOSIS — I5043 Acute on chronic combined systolic (congestive) and diastolic (congestive) heart failure: Secondary | ICD-10-CM | POA: Diagnosis present

## 2022-03-06 DIAGNOSIS — E349 Endocrine disorder, unspecified: Secondary | ICD-10-CM | POA: Diagnosis not present

## 2022-03-06 DIAGNOSIS — I4901 Ventricular fibrillation: Secondary | ICD-10-CM | POA: Diagnosis not present

## 2022-03-06 DIAGNOSIS — F03A0 Unspecified dementia, mild, without behavioral disturbance, psychotic disturbance, mood disturbance, and anxiety: Secondary | ICD-10-CM | POA: Diagnosis present

## 2022-03-06 DIAGNOSIS — I110 Hypertensive heart disease with heart failure: Secondary | ICD-10-CM | POA: Diagnosis present

## 2022-03-06 DIAGNOSIS — Z66 Do not resuscitate: Secondary | ICD-10-CM | POA: Diagnosis not present

## 2022-03-06 DIAGNOSIS — Z90410 Acquired total absence of pancreas: Secondary | ICD-10-CM | POA: Diagnosis not present

## 2022-03-06 DIAGNOSIS — I639 Cerebral infarction, unspecified: Secondary | ICD-10-CM

## 2022-03-06 DIAGNOSIS — I469 Cardiac arrest, cause unspecified: Secondary | ICD-10-CM | POA: Diagnosis not present

## 2022-03-06 DIAGNOSIS — I63431 Cerebral infarction due to embolism of right posterior cerebral artery: Secondary | ICD-10-CM | POA: Diagnosis present

## 2022-03-06 DIAGNOSIS — I340 Nonrheumatic mitral (valve) insufficiency: Secondary | ICD-10-CM | POA: Diagnosis not present

## 2022-03-06 DIAGNOSIS — I3139 Other pericardial effusion (noninflammatory): Secondary | ICD-10-CM | POA: Diagnosis present

## 2022-03-06 DIAGNOSIS — I9589 Other hypotension: Secondary | ICD-10-CM | POA: Diagnosis not present

## 2022-03-06 DIAGNOSIS — G5730 Lesion of lateral popliteal nerve, unspecified lower limb: Secondary | ICD-10-CM | POA: Diagnosis present

## 2022-03-06 DIAGNOSIS — N179 Acute kidney failure, unspecified: Secondary | ICD-10-CM | POA: Diagnosis present

## 2022-03-06 DIAGNOSIS — I5022 Chronic systolic (congestive) heart failure: Secondary | ICD-10-CM | POA: Diagnosis not present

## 2022-03-06 DIAGNOSIS — Z515 Encounter for palliative care: Secondary | ICD-10-CM | POA: Diagnosis not present

## 2022-03-06 DIAGNOSIS — C9000 Multiple myeloma not having achieved remission: Secondary | ICD-10-CM | POA: Diagnosis present

## 2022-03-06 DIAGNOSIS — E43 Unspecified severe protein-calorie malnutrition: Secondary | ICD-10-CM | POA: Diagnosis present

## 2022-03-06 DIAGNOSIS — I5023 Acute on chronic systolic (congestive) heart failure: Secondary | ICD-10-CM | POA: Diagnosis not present

## 2022-03-06 DIAGNOSIS — J9811 Atelectasis: Secondary | ICD-10-CM | POA: Diagnosis present

## 2022-03-06 DIAGNOSIS — E8589 Other amyloidosis: Secondary | ICD-10-CM | POA: Diagnosis not present

## 2022-03-06 DIAGNOSIS — E119 Type 2 diabetes mellitus without complications: Secondary | ICD-10-CM | POA: Diagnosis not present

## 2022-03-06 DIAGNOSIS — Z1152 Encounter for screening for COVID-19: Secondary | ICD-10-CM | POA: Diagnosis not present

## 2022-03-06 DIAGNOSIS — I351 Nonrheumatic aortic (valve) insufficiency: Secondary | ICD-10-CM | POA: Diagnosis present

## 2022-03-06 DIAGNOSIS — J690 Pneumonitis due to inhalation of food and vomit: Secondary | ICD-10-CM | POA: Diagnosis not present

## 2022-03-06 DIAGNOSIS — J1008 Influenza due to other identified influenza virus with other specified pneumonia: Secondary | ICD-10-CM | POA: Diagnosis present

## 2022-03-06 DIAGNOSIS — I69391 Dysphagia following cerebral infarction: Secondary | ICD-10-CM | POA: Diagnosis not present

## 2022-03-06 DIAGNOSIS — E1142 Type 2 diabetes mellitus with diabetic polyneuropathy: Secondary | ICD-10-CM | POA: Diagnosis present

## 2022-03-06 DIAGNOSIS — H538 Other visual disturbances: Secondary | ICD-10-CM | POA: Diagnosis present

## 2022-03-06 DIAGNOSIS — J180 Bronchopneumonia, unspecified organism: Secondary | ICD-10-CM | POA: Diagnosis present

## 2022-03-06 DIAGNOSIS — I420 Dilated cardiomyopathy: Secondary | ICD-10-CM | POA: Diagnosis present

## 2022-03-06 DIAGNOSIS — I63531 Cerebral infarction due to unspecified occlusion or stenosis of right posterior cerebral artery: Secondary | ICD-10-CM | POA: Diagnosis not present

## 2022-03-06 DIAGNOSIS — R627 Adult failure to thrive: Secondary | ICD-10-CM | POA: Diagnosis not present

## 2022-03-06 DIAGNOSIS — I462 Cardiac arrest due to underlying cardiac condition: Secondary | ICD-10-CM | POA: Diagnosis not present

## 2022-03-06 DIAGNOSIS — I472 Ventricular tachycardia, unspecified: Secondary | ICD-10-CM | POA: Diagnosis not present

## 2022-03-06 DIAGNOSIS — H4921 Sixth [abducent] nerve palsy, right eye: Secondary | ICD-10-CM | POA: Diagnosis present

## 2022-03-06 DIAGNOSIS — M21371 Foot drop, right foot: Secondary | ICD-10-CM | POA: Diagnosis not present

## 2022-03-06 DIAGNOSIS — E785 Hyperlipidemia, unspecified: Secondary | ICD-10-CM | POA: Diagnosis not present

## 2022-03-06 DIAGNOSIS — K119 Disease of salivary gland, unspecified: Secondary | ICD-10-CM

## 2022-03-06 DIAGNOSIS — I5021 Acute systolic (congestive) heart failure: Secondary | ICD-10-CM | POA: Diagnosis not present

## 2022-03-06 DIAGNOSIS — J09X1 Influenza due to identified novel influenza A virus with pneumonia: Secondary | ICD-10-CM | POA: Diagnosis not present

## 2022-03-06 DIAGNOSIS — E8581 Light chain (AL) amyloidosis: Secondary | ICD-10-CM | POA: Diagnosis not present

## 2022-03-06 DIAGNOSIS — I5041 Acute combined systolic (congestive) and diastolic (congestive) heart failure: Secondary | ICD-10-CM | POA: Diagnosis not present

## 2022-03-06 DIAGNOSIS — B370 Candidal stomatitis: Secondary | ICD-10-CM | POA: Diagnosis present

## 2022-03-06 DIAGNOSIS — J9601 Acute respiratory failure with hypoxia: Secondary | ICD-10-CM | POA: Diagnosis present

## 2022-03-06 LAB — COMPREHENSIVE METABOLIC PANEL
ALT: 26 U/L (ref 0–44)
AST: 28 U/L (ref 15–41)
Albumin: 2.7 g/dL — ABNORMAL LOW (ref 3.5–5.0)
Alkaline Phosphatase: 564 U/L — ABNORMAL HIGH (ref 38–126)
Anion gap: 8 (ref 5–15)
BUN: 10 mg/dL (ref 8–23)
CO2: 22 mmol/L (ref 22–32)
Calcium: 8.5 mg/dL — ABNORMAL LOW (ref 8.9–10.3)
Chloride: 108 mmol/L (ref 98–111)
Creatinine, Ser: 0.89 mg/dL (ref 0.61–1.24)
GFR, Estimated: >60 mL/min (ref >60)
Glucose, Bld: 63 mg/dL — ABNORMAL LOW (ref 70–99)
Potassium: 3.3 mmol/L — ABNORMAL LOW (ref 3.5–5.1)
Sodium: 138 mmol/L (ref 135–145)
Total Bilirubin: 0.5 mg/dL (ref 0.3–1.2)
Total Protein: 5.6 g/dL — ABNORMAL LOW (ref 6.5–8.1)

## 2022-03-06 LAB — URINALYSIS, ROUTINE W REFLEX MICROSCOPIC
Bilirubin Urine: NEGATIVE
Glucose, UA: NEGATIVE mg/dL
Hgb urine dipstick: NEGATIVE
Ketones, ur: NEGATIVE mg/dL
Leukocytes,Ua: NEGATIVE
Nitrite: NEGATIVE
Protein, ur: NEGATIVE mg/dL
Specific Gravity, Urine: 1.03 (ref 1.005–1.030)
pH: 5 (ref 5.0–8.0)

## 2022-03-06 LAB — ECHOCARDIOGRAM COMPLETE
AR max vel: 1.59 cm2
AV Area VTI: 1.53 cm2
AV Area mean vel: 1.49 cm2
AV Mean grad: 8.5 mmHg
AV Peak grad: 15.3 mmHg
Ao pk vel: 1.96 m/s
Area-P 1/2: 3.48 cm2
Height: 65 in
P 1/2 time: 476 msec
S' Lateral: 4.9 cm
Weight: 2240 oz

## 2022-03-06 LAB — RAPID URINE DRUG SCREEN, HOSP PERFORMED
Amphetamines: NOT DETECTED
Barbiturates: NOT DETECTED
Benzodiazepines: NOT DETECTED
Cocaine: NOT DETECTED
Opiates: POSITIVE — AB
Tetrahydrocannabinol: NOT DETECTED

## 2022-03-06 LAB — ETHANOL: Alcohol, Ethyl (B): <10 mg/dL (ref <10)

## 2022-03-06 LAB — LIPID PANEL
Cholesterol: 119 mg/dL (ref 0–200)
HDL: 38 mg/dL — ABNORMAL LOW (ref >40)
LDL Cholesterol: 66 mg/dL (ref 0–99)
Total CHOL/HDL Ratio: 3.1 RATIO
Triglycerides: 75 mg/dL (ref <150)
VLDL: 15 mg/dL (ref 0–40)

## 2022-03-06 LAB — GLUCOSE, CAPILLARY
Glucose-Capillary: 199 mg/dL — ABNORMAL HIGH (ref 70–99)
Glucose-Capillary: 201 mg/dL — ABNORMAL HIGH (ref 70–99)

## 2022-03-06 LAB — TSH: TSH: 0.835 u[IU]/mL (ref 0.350–4.500)

## 2022-03-06 LAB — CBG MONITORING, ED: Glucose-Capillary: 101 mg/dL — ABNORMAL HIGH (ref 70–99)

## 2022-03-06 MED ORDER — PANCRELIPASE (LIP-PROT-AMYL) 12000-38000 UNITS PO CPEP
72000.0000 [IU] | ORAL_CAPSULE | Freq: Three times a day (TID) | ORAL | Status: DC
  Administered 2022-03-06: 72000 [IU] via ORAL
  Administered 2022-03-07 – 2022-03-09 (×4): 36000 [IU] via ORAL
  Administered 2022-03-16: 12000 [IU] via ORAL
  Filled 2022-03-06 (×2): qty 2
  Filled 2022-03-06 (×2): qty 6
  Filled 2022-03-06: qty 2
  Filled 2022-03-06: qty 6
  Filled 2022-03-06: qty 2
  Filled 2022-03-06 (×2): qty 6
  Filled 2022-03-06 (×5): qty 2
  Filled 2022-03-06 (×2): qty 6

## 2022-03-06 MED ORDER — MIRABEGRON ER 25 MG PO TB24
25.0000 mg | ORAL_TABLET | Freq: Every day | ORAL | Status: DC
  Administered 2022-03-06 – 2022-03-22 (×16): 25 mg via ORAL
  Filled 2022-03-06 (×18): qty 1

## 2022-03-06 MED ORDER — ASPIRIN 81 MG PO CHEW
81.0000 mg | CHEWABLE_TABLET | Freq: Every day | ORAL | Status: DC
  Filled 2022-03-06: qty 1

## 2022-03-06 MED ORDER — INSULIN ASPART 100 UNIT/ML IJ SOLN
0.0000 [IU] | Freq: Three times a day (TID) | INTRAMUSCULAR | Status: DC
  Administered 2022-03-07: 3 [IU] via SUBCUTANEOUS
  Administered 2022-03-07: 2 [IU] via SUBCUTANEOUS
  Administered 2022-03-07: 3 [IU] via SUBCUTANEOUS
  Administered 2022-03-08 – 2022-03-09 (×3): 2 [IU] via SUBCUTANEOUS
  Administered 2022-03-10: 3 [IU] via SUBCUTANEOUS
  Administered 2022-03-11: 2 [IU] via SUBCUTANEOUS
  Administered 2022-03-11 (×2): 1 [IU] via SUBCUTANEOUS
  Administered 2022-03-12 (×2): 2 [IU] via SUBCUTANEOUS
  Administered 2022-03-12: 1 [IU] via SUBCUTANEOUS
  Administered 2022-03-13 – 2022-03-14 (×3): 2 [IU] via SUBCUTANEOUS
  Administered 2022-03-14: 1 [IU] via SUBCUTANEOUS
  Administered 2022-03-14: 2 [IU] via SUBCUTANEOUS
  Administered 2022-03-15: 1 [IU] via SUBCUTANEOUS
  Administered 2022-03-17: 2 [IU] via SUBCUTANEOUS
  Administered 2022-03-18: 3 [IU] via SUBCUTANEOUS
  Administered 2022-03-18: 5 [IU] via SUBCUTANEOUS
  Administered 2022-03-18: 2 [IU] via SUBCUTANEOUS
  Administered 2022-03-19 – 2022-03-20 (×5): 3 [IU] via SUBCUTANEOUS
  Administered 2022-03-20: 5 [IU] via SUBCUTANEOUS
  Administered 2022-03-21 – 2022-03-22 (×3): 3 [IU] via SUBCUTANEOUS
  Administered 2022-03-22: 5 [IU] via SUBCUTANEOUS

## 2022-03-06 MED ORDER — CLOPIDOGREL BISULFATE 75 MG PO TABS
75.0000 mg | ORAL_TABLET | Freq: Every day | ORAL | Status: DC
  Administered 2022-03-06 – 2022-03-21 (×15): 75 mg via ORAL
  Filled 2022-03-06 (×16): qty 1

## 2022-03-06 NOTE — ED Notes (Signed)
Vascular is taking patient upstairs to 3W

## 2022-03-06 NOTE — Evaluation (Addendum)
Speech Language Pathology Evaluation Patient Details Name: William Barron MRN: [id number] DOB: 16-Aug-1945 Today's Date: 03/06/2022 Time: 2482-5003 SLP Time Calculation (min) (ACUTE ONLY): 27 min  Problem List:  Patient Active Problem List   Diagnosis Date Noted   Occipital stroke ([place]) 03/05/2022   Leg swelling 01/20/2022   Nonrheumatic aortic valve stenosis 01/20/2022   Abnormal CT of the abdomen    Pressure injury of skin 09/05/2021   Goals of care, counseling/discussion 09/03/2021   Postoperative anemia due to acute blood loss 08/31/2021   Reactive thrombocytosis 08/31/2021   Malnutrition of moderate degree 08/19/2021   Debility 08/19/2021   Asymptomatic bacteriuria 08/15/2021   H/O Whipple procedure 08/13/2021   Chronic hiccups 08/13/2021   Candida esophagitis ([place]) 08/13/2021   Insulin-requiring or dependent type II diabetes mellitus ([place]) 08/13/2021   Anemia 08/13/2021   Hypoalbuminemia 08/13/2021   Frequent PVCs 08/13/2021   Prolonged QT interval 08/13/2021   Facial droop 08/13/2021   Hormone replacement therapy 08/13/2021   Lesion of parotid gland 08/13/2021   Protein-calorie malnutrition, severe ([place]) 08/13/2021   Feeding difficulties 08/12/2021   Posterior tibial tendon dysfunction, right 04/15/2021   Posterior tibial tendinitis, right leg    Prostate cancer ([place]) 02/11/2020   Orthostatic tremor 10/01/2019   Dyslipidemia 09/12/2019   Nonrheumatic pulmonary valve insufficiency 09/12/2019   Red blood cell antibody positive, compatible PRBC difficult to obtain 02/15/2018   Diplopia 01/03/2018   Benign essential tremor 01/03/2018   Pain in joint of right ankle 05/17/2017   Foot pain 05/17/2017   Peroneal tendinitis 05/17/2017   Depression with anxiety 01/03/2017   Insomnia 05/05/2016   Coronary artery calcification seen on CAT scan    DOE (dyspnea on exertion)    Fatigue 02/27/2015   Elevated homocysteine 09/26/2014   Multiple myeloma ([place]) 09/26/2014    Acquired pes planus of right foot 07/02/2014   Posterior tibial tendon dysfunction 07/02/2014   Encounter for antineoplastic chemotherapy 01/02/2014   Kahler disease ([place]) 01/02/2014   Avitaminosis D 01/02/2014   Amyloidosis ([place]) 03/26/2013   Enlarged lymph node 02/14/2013   Pure hypercholesterolemia 03/29/2011   Memory disorder 09/21/2010   Labile hypertension 09/21/2010   Past Medical History:  Past Medical History:  Diagnosis Date   Amyloidosis ([place])    Diabetes mellitus without complication ([place])    Feeding tube dysfunction, initial encounter 08/12/2021   GERD (gastroesophageal reflux disease)    Hearing loss    Has hearing aids   History of kidney stones    Hyperlipidemia    Jejunostomy tube present ([place])    Multiple myeloma ([place])    and amylodosis    Nephrolithiasis    Occasional tremors    Pancreatic insufficiency    Pneumonia    Prostate cancer ([place])    Prostatitis    acute and chronic   Skin cancer    Past Surgical History:  Past Surgical History:  Procedure Laterality Date   APPENDECTOMY     CARDIAC CATHETERIZATION N/A 06/13/2015   Procedure: Left Heart Cath and Coronary Angiography;  Surgeon: Jettie Booze, MD;  Location: [facility];  Service: Cardiovascular;  Laterality: N/A;   COLONOSCOPY  04/07/2000   ELBOW SURGERY  03/15/2001   right   ESOPHAGOGASTRODUODENOSCOPY (EGD) WITH PROPOFOL N/A 09/07/2021   Procedure: ESOPHAGOGASTRODUODENOSCOPY (EGD) WITH PROPOFOL;  Surgeon: Irene Shipper, MD;  Location: [facility];  Service: Gastroenterology;  Laterality: N/A;   EXTRACORPOREAL SHOCK WAVE LITHOTRIPSY Left 07/17/2018   Procedure: EXTRACORPOREAL SHOCK WAVE LITHOTRIPSY (ESWL);  Surgeon: [name], MD;  Location: [facility];  Service: Urology;  Laterality: Left;   EXTRACORPOREAL SHOCK WAVE LITHOTRIPSY Right 12/08/2020   Procedure: RIGHT EXTRACORPOREAL SHOCK WAVE LITHOTRIPSY (ESWL);  Surgeon: Raynelle Bring, MD;  Location: [facility];   Service: Urology;  Laterality: Right;   FOOT ARTHRODESIS Right 04/15/2021   Procedure: RIGHT SUBTALAR AND TALONAVICULAR FUSION;  Surgeon: Newt Minion, MD;  Location: [facility];  Service: Orthopedics;  Laterality: Right;   HERNIA REPAIR     IR [place]CH REMOV OBSTRUC MAT ANY COLON TUBE W/FLUORO  08/27/2021   IR [place] GASTRO/COLONIC TUBE PERCUT W/FLUORO  09/08/2021   ROTATOR CUFF REPAIR     WHIPPLE PROCEDURE  07/13/2021   HPI:  76 year old male history of amyloidosis, multiple myeloma, history of prostate cancer, history of pancreatic cancer status post Whipple, dyslipidemia, diabetes insulin requiring presents to the ER today with right-sided headache and blurred vision.  Last known well was yesterday evening 03/04/2022.  Patient states he woke up around 6:00 this morning.  He states that he had difficulty seeing out of his right eye.  He went to see an ophthalmologist who referred him to the ER.     CT scan of the head in the ER demonstrated a acute/subacute right occipital stroke.  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 Associated  mild petechial blood products without frank hemorrhagic  transformation or significant regional mass effect.  2. Underlying moderate chronic microvascular ischemic disease; SLE generated   Assessment / Plan / Recommendation Clinical Impression  Pt seen for speech/language/cognitive assessment with portions of [place] Mental Status Examination and observations/informal assessment with pt being oriented x4, able to answer biographical questions/give detailed medical hx and follow all commands during evaluation.  Pt with visual field cut, but able to read environmental signs, nametag and clock during assessment, although formal reading not assessed.  Pt asking/answering appropriate questions related to hospitalization and able to indicate needs at home re: safety (ie:  "I use a walker for longer distances").  Speech intelligible within conversation and OME unremarkable.  Unsure about baseline cognitive status as no family available.  No f/u recommended from [facility] standpoint for speech/language/cognition.  ST will s/o in this setting.  If baseline cognitive issues are not indicated, ST may f/u for full assessment in AIR.  Than you for this consult.    SLP Assessment  SLP Recommendation/Assessment: Patient does not need any further Speech Language Pathology Services SLP Visit Diagnosis: Cognitive communication deficit (R41.841)    Recommendations for follow up therapy are one component of a multi-disciplinary discharge planning process, led by the attending physician.  Recommendations may be updated based on patient status, additional functional criteria and insurance authorization.    Follow Up Recommendations  No SLP follow up    Assistance Recommended at Discharge  Intermittent Supervision/Assistance  Functional Status Assessment Patient has had a recent decline in their functional status and demonstrates the ability to make significant improvements in function in a reasonable and predictable amount of time.  Frequency and Duration Other (Comment) (evaluation only)         SLP Evaluation Cognition  Overall Cognitive Status: No family/caregiver present to determine baseline cognitive functioning Arousal/Alertness: Awake/alert Orientation Level: Oriented X4 Year: 2023 Month: December Day of Week: Correct Attention: Sustained Sustained Attention: Appears intact Memory: Impaired Memory Impairment: Retrieval deficit Awareness: Appears intact Problem Solving: Appears  intact (stated he "knows he needs walker for further distances") Safety/Judgment: Other (comment) (appears grossly [facility])       Comprehension  Auditory Comprehension Overall Auditory Comprehension: Appears within functional limits for tasks assessed Yes/No Questions: Within Functional  Limits Commands: Within Functional Limits Conversation: Complex Visual Recognition/Discrimination Discrimination: Exceptions to [facility] Other Visual Recogniton/Discrimination Comments: visual field cut Reading Comprehension Reading Status: Within funtional limits (for environmental signs; pt able to read simple signs/nametag/clock)    Expression Expression Primary Mode of Expression: Verbal Verbal Expression Overall Verbal Expression: Appears within functional limits for tasks assessed Level of Generative/Spontaneous Verbalization: Conversation Naming: Not tested [employer] of Communication: Not applicable Written Expression Written Expression: Not tested   Oral / Motor  Oral Motor/Sensory Function Overall Oral Motor/Sensory Function: Within functional limits Motor Speech Overall Motor Speech: Appears within functional limits for tasks assessed Respiration: Within functional limits Phonation: Normal Resonance: Within functional limits Articulation: Within functional limitis Intelligibility: Intelligible Motor Planning: Witnin functional limits Motor Speech Errors: Not applicable            Elvina Sidle, M.S., CCC-SLP 03/06/2022, 11:02 AM

## 2022-03-06 NOTE — Progress Notes (Signed)
Physical Therapy Evaluation Patient Details Name: William Barron MRN: [id number] DOB: 06-Apr-1945 Today's Date: 03/06/2022  History of Present Illness  76 year old male admitted 12/22  with right-sided headache and blurred vision +Acute right PCA stroke. PMHX:  amyloidosis, multiple myeloma, history of prostate cancer, history of pancreatic cancer status post Whipple, dyslipidemia, diabetes.  Clinical Impression  Pt admitted with above diagnosis. Baseline ambulatory with RW, slow, no assistance. Wife does the cooking and cleaning however pt capable to performing ADLs. Currently requires min assist due to posterior LOB with transfers and ambulation. Wears AFO on Rt from previous nerve damage that slowly occurred starting in February. Supportive wife, agreeable to post acute rehab, and available 24/7 to assist at d/c. Pt currently with functional limitations due to the deficits listed below (see PT Problem List). Pt will benefit from skilled PT to increase their independence and safety with mobility to allow discharge to the venue listed below.          Recommendations for follow up therapy are one component of a multi-disciplinary discharge planning process, led by the attending physician.  Recommendations may be updated based on patient status, additional functional criteria and insurance authorization.  Follow Up Recommendations Acute inpatient rehab (3hours/day)      Assistance Recommended at Discharge Intermittent Supervision/Assistance  Patient can return home with the following  A lot of help with walking and/or transfers;A little help with bathing/dressing/bathroom;Assistance with cooking/housework;Help with stairs or ramp for entrance;Assist for transportation    Equipment Recommendations None recommended by PT  Recommendations for Other Services  Rehab consult    Functional Status Assessment Patient has had a recent decline in their functional status and demonstrates the ability  to make significant improvements in function in a reasonable and predictable amount of time.     Precautions / Restrictions Precautions Precaution Comments: vision impaired Restrictions Weight Bearing Restrictions: No      Mobility  Bed Mobility Overal bed mobility: Needs Assistance Bed Mobility: Supine to Sit, Sit to Supine     Supine to sit: Supervision Sit to supine: Supervision   General bed mobility comments: Supervision for safety, cues for awareness for proximity to edge.    Transfers Overall transfer level: Needs assistance Equipment used: Rolling walker (2 wheels) Transfers: Sit to/from Stand Sit to Stand: Min assist           General transfer comment: Min assist for boost and balance from EOB. Cues for foot placement and hands for push up.    Ambulation/Gait Ambulation/Gait assistance: Min assist Gait Distance (Feet): 16 Feet Assistive device: Rolling walker (2 wheels) Gait Pattern/deviations: Step-through pattern, Narrow base of support, Leaning posteriorly, Decreased stride length, Shuffle Gait velocity: slow Gait velocity interpretation: <1.31 ft/sec, indicative of household ambulator   General Gait Details: Min assist for posterior LOB, hands on assist at all times. Trunk flexed, cues for upright posture and to widen BOS. Shuffled steps, wears AFO on Rt. No buckling noted.  Stairs            Wheelchair Mobility    Modified Rankin (Stroke Patients Only) Modified Rankin (Stroke Patients Only) Pre-Morbid Rankin Score: Moderate disability Modified Rankin: Moderately severe disability     Balance Overall balance assessment: Needs assistance Sitting-balance support: No upper extremity supported, Feet supported Sitting balance-Leahy Scale: Fair     Standing balance support: During functional activity, Bilateral upper extremity supported Standing balance-Leahy Scale: Poor  Pertinent Vitals/Pain  Pain Assessment Pain Assessment: No/denies pain    Home Living Family/patient expects to be discharged to:: Unsure Living Arrangements: Spouse/significant other Available Help at Discharge: Family;Available 24 hours/day Type of Home: House Home Access: Stairs to enter Entrance Stairs-Rails: Right Entrance Stairs-Number of Steps: 4 Alternate Level Stairs-Number of Steps: has chair lift Home Layout: Two level;1/2 bath on main level Home Equipment: Advice worker (2 wheels);Rollator (4 wheels);BSC/3in1;Wheelchair - manual Additional Comments: Using RW to ambulate at home. Wife cooks meals.    Prior Function Prior Level of Function : Needs assist;History of Falls (last six months)             Mobility Comments: Ambulating with RW at home, one fall 3 mo ago. ADLs Comments: Wife assists with cooking/cleaning. Pt able to bath dress ind.     Hand Dominance   Dominant Hand:  (Amb (mostly Rt))    Extremity/Trunk Assessment   Upper Extremity Assessment Upper Extremity Assessment: Defer to OT evaluation    Lower Extremity Assessment Lower Extremity Assessment: RLE deficits/detail RLE Deficits / Details: Rt ankle DF 3+/5, quad and hip flexor 4/5 RLE Sensation: WNL       Communication   Communication: HOH  Cognition Arousal/Alertness: Awake/alert Behavior During Therapy: WFL for tasks assessed/performed Overall Cognitive Status: Within Functional Limits for tasks assessed                                          General Comments General comments (skin integrity, edema, etc.): wife present, supportive    Exercises     Assessment/Plan    PT Assessment Patient needs continued PT services  PT Problem List Decreased strength;Decreased activity tolerance;Decreased balance;Decreased mobility;Decreased coordination;Decreased knowledge of use of DME       PT Treatment Interventions DME instruction;Gait training;Stair training;Functional mobility  training;Therapeutic activities;Therapeutic exercise;Balance training;Neuromuscular re-education;Patient/family education    PT Goals (Current goals can be found in the Care Plan section)  Acute Rehab PT Goals Patient Stated Goal: Get well PT Goal Formulation: With patient/family Time For Goal Achievement: 03/20/22 Potential to Achieve Goals: Good    Frequency Min 4X/week     Co-evaluation               AM-PAC PT "6 Clicks" Mobility  Outcome Measure Help needed turning from your back to your side while in a flat bed without using bedrails?: A Little Help needed moving from lying on your back to sitting on the side of a flat bed without using bedrails?: A Little Help needed moving to and from a bed to a chair (including a wheelchair)?: A Little Help needed standing up from a chair using your arms (e.g., wheelchair or bedside chair)?: A Lot Help needed to walk in hospital room?: A Lot Help needed climbing 3-5 steps with a railing? : A Lot 6 Click Score: 15    End of Session Equipment Utilized During Treatment: Gait belt Activity Tolerance: Patient tolerated treatment well Patient left: in bed;with call bell/phone within reach;with bed alarm set;with family/visitor present   PT Visit Diagnosis: Unsteadiness on feet (R26.81);Other abnormalities of gait and mobility (R26.89);Muscle weakness (generalized) (M62.81);History of falling (Z91.81);Difficulty in walking, not elsewhere classified (R26.2);Other symptoms and signs involving the nervous system (R29.898)    Time: 2244-9753 PT Time Calculation (min) (ACUTE ONLY): 26 min   Charges:   PT Evaluation $PT Eval Moderate Complexity: 1 Mod  PT Treatments $Gait Training: 8-22 mins       Candie Mile, PT, DPT Physical Therapist Acute Rehabilitation Services [facility] 03/06/2022, 4:27 PM

## 2022-03-06 NOTE — Evaluation (Signed)
Clinical/Bedside Swallow Evaluation Patient Details  Name: William Barron MRN: [id number] Date of Birth: 17-May-1945  Today's Date: 03/06/2022 Time: SLP Start Time (ACUTE ONLY): 2831 SLP Stop Time (ACUTE ONLY): 5176 SLP Time Calculation (min) (ACUTE ONLY): 27 min  Past Medical History:  Past Medical History:  Diagnosis Date   Amyloidosis ([place])    Diabetes mellitus without complication ([place])    Feeding tube dysfunction, initial encounter 08/12/2021   GERD (gastroesophageal reflux disease)    Hearing loss    Has hearing aids   History of kidney stones    Hyperlipidemia    Jejunostomy tube present ([place])    Multiple myeloma ([place])    and amylodosis    Nephrolithiasis    Occasional tremors    Pancreatic insufficiency    Pneumonia    Prostate cancer ([place])    Prostatitis    acute and chronic   Skin cancer    Past Surgical History:  Past Surgical History:  Procedure Laterality Date   APPENDECTOMY     CARDIAC CATHETERIZATION N/A 06/13/2015   Procedure: Left Heart Cath and Coronary Angiography;  Surgeon: Jettie Booze, MD;  Location: [place] [facility];  Service: Cardiovascular;  Laterality: N/A;   COLONOSCOPY  04/07/2000   ELBOW SURGERY  03/15/2001   right   ESOPHAGOGASTRODUODENOSCOPY (EGD) WITH PROPOFOL N/A 09/07/2021   Procedure: ESOPHAGOGASTRODUODENOSCOPY (EGD) WITH PROPOFOL;  Surgeon: Irene Shipper, MD;  Location: [facility];  Service: Gastroenterology;  Laterality: N/A;   EXTRACORPOREAL SHOCK WAVE LITHOTRIPSY Left 07/17/2018   Procedure: EXTRACORPOREAL SHOCK WAVE LITHOTRIPSY (ESWL);  Surgeon: [name], MD;  [facility];  Service: Urology;  Laterality: Left;   EXTRACORPOREAL SHOCK WAVE LITHOTRIPSY Right 12/08/2020   Procedure: RIGHT EXTRACORPOREAL SHOCK WAVE LITHOTRIPSY (ESWL);  Surgeon: Raynelle Bring, MD;  Location: [facility];  Service: Urology;  Laterality: Right;   FOOT ARTHRODESIS Right 04/15/2021   Procedure: RIGHT SUBTALAR AND  TALONAVICULAR FUSION;  Surgeon: Newt Minion, MD;  Location: [facility];  Service: Orthopedics;  Laterality: Right;   HERNIA REPAIR     IR MECH REMOV OBSTRUC MAT ANY COLON TUBE W/FLUORO  08/27/2021   IR [place] GASTRO/COLONIC TUBE PERCUT W/FLUORO  09/08/2021   ROTATOR CUFF REPAIR     WHIPPLE PROCEDURE  07/13/2021   HPI:  76 year old male history of amyloidosis, multiple myeloma, history of prostate cancer, history of pancreatic cancer status post Whipple, dyslipidemia, diabetes insulin requiring presents to the ER today with right-sided headache and blurred vision. Last known well was yesterday evening 03/04/2022. Patient states he woke up around 6:00 this morning. He states that he had difficulty seeing out of his right eye. He went to see an ophthalmologist who referred him to the ER. CT scan of the head in the ER demonstrated a acute/subacute right occipital stroke. 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 Associated mild petechial blood products without frank hemorrhagic transformation or significant regional mass effect. 2. Underlying moderate chronic microvascular ischemic disease; BSE generated to assess swallow function as pt failed [facility] swallow screen.    Assessment / Plan / Recommendation  Clinical Impression  Pt seen for clinical swallowing evaluation with normal oropharyngeal swallow noted without overt s/sx of aspiration with all consistencies, timely swallow and no c/o residue/esophageal symptoms during oral intake.  Pt did state "I have phlegm in my throat at night" and c/o xerostomia.  Pt's oral mucosa  was min dry during oral assessment, but otherwise unremarkable.  Recommend initiating a diet of Regular/thin liquids with no f/u from [place].  Thank you for this consult. SLP Visit Diagnosis: Dysphagia, unspecified (R13.10)    Aspiration Risk  No limitations    Diet Recommendation    Regular/thin liquids  Medication Administration: Whole meds with liquid    Other  Recommendations Oral Care Recommendations: Oral care BID;Patient independent with oral care    Recommendations for follow up therapy are one component of a multi-disciplinary discharge planning process, led by the attending physician.  Recommendations may be updated based on patient status, additional functional criteria and insurance authorization.  Follow up Recommendations No SLP follow up      Assistance Recommended at Discharge Intermittent Supervision/Assistance  Functional Status Assessment Patient has had a recent decline in their functional status and demonstrates the ability to make significant improvements in function in a reasonable and predictable amount of time.  Frequency and Duration Other (Comment) (evaluation only)          Prognosis Prognosis for Safe Diet Advancement: Good      Swallow Study   General HPI: 76 year old male history of amyloidosis, multiple myeloma, history of prostate cancer, history of pancreatic cancer status post Whipple, dyslipidemia, diabetes insulin requiring presents to the ER today with right-sided headache and blurred vision. Last known well was yesterday evening 03/04/2022. Patient states he woke up around 6:00 this morning. He states that he had difficulty seeing out of his right eye. He went to see an ophthalmologist who referred him to the ER. CT scan of the head in the ER demonstrated a acute/subacute right occipital stroke. 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 Associated mild petechial blood products without frank hemorrhagic transformation or significant regional mass effect. 2. Underlying moderate chronic microvascular ischemic disease; BSE generated to assess swallow function as pt failed [facility] swallow screen. Type of Study: Bedside Swallow  Evaluation Previous Swallow Assessment: [facility] swallow screen failed Diet Prior to this Study: NPO Temperature Spikes Noted: No Respiratory Status: Nasal cannula (2L) History of Recent Intubation: No Behavior/Cognition: Alert;Cooperative Oral Cavity Assessment: Dry Oral Care Completed by SLP: Other (Comment) (Pt brushed teeth prior to SLP arrival) Oral Cavity - Dentition: Adequate natural dentition Vision: Functional for self-feeding Self-Feeding Abilities: Able to feed self;Needs assist Patient Positioning: Upright in bed Baseline Vocal Quality: Normal Volitional Cough: Strong Volitional Swallow: Able to elicit    Oral/Motor/Sensory Function Overall Oral Motor/Sensory Function: Within functional limits   Ice Chips Ice chips: Within functional limits Presentation: Spoon   Thin Liquid Thin Liquid: Within functional limits Presentation: Cup;Straw    Nectar Thick Nectar Thick Liquid: Not tested   Honey Thick Honey Thick Liquid: Not tested   Puree Puree: Within functional limits Presentation: Self Fed   Solid     Solid: Within functional limits Presentation: [place], M.S., CCC-SLP 03/06/2022,11:18 AM

## 2022-03-06 NOTE — Progress Notes (Addendum)
[facility]                                                                              William Barron, is a 76 y.o. male, DOB - [birth year], GGE:[id number] Admit date - 03/05/2022    Outpatient Primary MD for the patient is [name], MD  LOS - 0  days  Chief Complaint  Patient presents with   Headache       Brief summary   Patient is a 76 year old male with history of pancreatic CA status post Whipple, prostate CA, multiple myeloma, amyloidosis, dyslipidemia, DM, presented to ED with right-sided headache and blurred vision.  Last known well was in the evening of 03/04/2022.  He woke up on the morning of admission on 12/22 with difficulty seeing out of his right eye.  He went to see an ophthalmologist and was referred to ER CT head showed acute/subacute right occipital CVA Failed swallow evaluation Neurology was consulted, admitted for further workup..   Assessment & Plan    Principal Problem: Acute right PCA stroke ([place]) -Presented with visual issues, having difficulty seeing half of the clock.  CT head showed right PCA territory stroke. -MRI brain showed acute to early subacute right PCA territory infarct.  Associated mild petechial blood products without frank hemorrhagic transformation or significant residual mass effect.  Underlying moderate chronic microvascular ischemic disease -CTA head and neck showed acute or early subacute infarct in the right occipital lobe, suspected high-grade stenosis versus occlusion of the distal right PCA. -Follow 2D echo, venous Dopplers -Passed SLP evaluation, will start dual antiplatelet therapy with aspirin 81 mg daily and Plavix 75 mg daily as recommended by neurology -Lipid panel showed LDL 66, A1c pending, not on statin PTA -PT OT evaluation pending  Active Problems: Hypokalemia -Replaced  GERD -Continue PPI  Diabetes mellitus type 2, IDDM -Follow hemoglobin A1c, started on Semglee 4 units daily, sliding  scale insulin, sensitive  History of pancreatic CA status post Whipple procedure -Continue Creon 72,000 units 3 times daily with meals -Follows with [name]  History of AL amyloidosis ([place]),  Multiple myeloma ([place]) -Outpatient follow-up with his oncologist, [name] at [facility].  History of prostate CA -Continue outpatient follow-up with urology, Dr. Jeffie Pollock, completed XRT on 06/02/2020     Lesion of parotid gland -MRI brain incidentally also showed 1.8 cm T2 hypointense lesion within the posterior aspect of the right parotid gland, indeterminate.  Recommended nonemergent outpatient ENT evaluation -CTA showed 1.6 cm partially calcified lesion in the posterior aspect of right parotid gland with multiple surrounding smaller lesion, could potentially represent primary parotid neoplasm benign or malignant or enlarged lymph nodes, recommended outpatient ENT evaluation    Code Status: Full code DVT Prophylaxis:  SCD's Start: 03/05/22 2235   Level of Care: Level of care: Telemetry Medical Family Communication: Updated patient's wife, Mallory Enriques on phone today   Disposition Plan:      Remains inpatient appropriate: Workup in progress for acute CVA   Procedures:  MRI brain, CTA head and neck  Consultants:   Neurology Antimicrobials: None     Medications  aspirin EC  325 mg Oral Daily   Or   aspirin  300 mg Rectal Daily   insulin glargine-yfgn  4 Units Subcutaneous Daily   pantoprazole (PROTONIX) IV  40 mg Intravenous Q24H      Subjective:   William Barron was seen and examined today.  Seen earlier this morning, states his visual issues appears to be resolving.  Has not ambulated yet.  No acute nausea vomiting, dizziness, chest pain or shortness of breath.  Objective:   Vitals:   03/06/22 0430 03/06/22 0500 03/06/22 0548 03/06/22 0829  BP: 124/69 129/73  139/65  Pulse: 70 72  75  Resp: _0 Temp:   98.3 F (36.8 C) 98 F (36.7 C)   TempSrc:   Oral Oral  SpO2: 100% 100%  100%  Weight:      Height:        Intake/Output Summary (Last 24 hours) at 03/06/2022 1004 Last data filed at 03/06/2022 0801 Gross per 24 hour  Intake --  Output 400 ml  Net -400 ml     Wt Readings from Last 3 Encounters:  03/05/22 63.5 kg  01/28/22 66.3 kg  01/14/22 67.2 kg     Exam General: Alert and oriented x 3, NAD, no dysarthria Cardiovascular: S1 S2 auscultated,  RRR Respiratory: CTAB, no wheezing Gastrointestinal: Soft, NT, ND, NBS Ext: no pedal edema bilaterally Neuro: Strength 5/5 LUE, LLE, RUE, 4/5 RLE  Psych: Normal affect and demeanor, alert and oriented x3     Data Reviewed:  I have personally reviewed following labs    CBC Lab Results  Component Value Date   WBC 7.0 03/05/2022   RBC 4.35 03/05/2022   HGB 11.9 (L) 03/05/2022   HCT 35.0 (L) 03/05/2022   MCV 78.4 (L) 03/05/2022   MCH 23.9 (L) 03/05/2022   PLT 419 (H) 03/05/2022   MCHC 30.5 03/05/2022   RDW 18.2 (H) 03/05/2022   LYMPHSABS 1.1 03/05/2022   MONOABS 0.5 03/05/2022   EOSABS 0.1 03/05/2022   BASOSABS 0.1 58/11/9831     Last metabolic panel Lab Results  Component Value Date   NA 138 03/06/2022   K 3.3 (L) 03/06/2022   CL 108 03/06/2022   CO2 22 03/06/2022   BUN 10 03/06/2022   CREATININE 0.89 03/06/2022   GLUCOSE 63 (L) 03/06/2022   GFRNONAA >60 03/06/2022   GFRAA >60 07/13/2018   CALCIUM 8.5 (L) 03/06/2022   PHOS 3.1 09/10/2021   PROT 5.6 (L) 03/06/2022   ALBUMIN 2.7 (L) 03/06/2022   LABGLOB 2.7 06/21/2018   AGRATIO 1.3 06/21/2018   BILITOT 0.5 03/06/2022   ALKPHOS 564 (H) 03/06/2022   AST 28 03/06/2022   ALT 26 03/06/2022   ANIONGAP 8 03/06/2022    CBG (last 3)  Recent Labs    03/05/22 1759 03/05/22 1905  GLUCAP 94 75      Coagulation Profile: Recent Labs  Lab 03/05/22 1632  INR 1.2     Radiology Studies: I have personally reviewed the imaging studies  MR BRAIN WO CONTRAST  Result Date:  03/06/2022 CLINICAL DATA:  Initial evaluation for neuro deficit, stroke suspected. EXAM: MRI HEAD WITHOUT CONTRAST TECHNIQUE: Multiplanar, multiecho pulse sequences of the brain and surrounding structures were obtained without intravenous contrast. COMPARISON:  Prior CT from earlier the same day. FINDINGS: Brain: Cerebral volume within normal limits. Patchy T2/FLAIR hyperintensity involving the periventricular deep white matter both cerebral hemispheres, most consistent with chronic small vessel ischemic disease, moderate in nature. Confluent restricted diffusion  involving the right occipital lobe, consistent with an acute to early subacute right PCA territory infarct. Associated mild petechial blood products without frank hemorrhagic transformation. No significant regional mass effect. No other evidence for acute or subacute ischemia. Gray-white matter differentiation otherwise maintained. No other acute or chronic intracranial blood products. No mass lesion, midline shift or mass effect. No hydrocephalus or extra-axial fluid collection. Pituitary gland and suprasellar region within normal limits. Vascular: Major intracranial vascular flow voids are maintained. Skull and upper cervical spine: Craniocervical junction normal. Bone marrow signal intensity within normal limits. No scalp soft tissue abnormality. Sinuses/Orbits: Globes and orbital soft tissues demonstrate no acute finding. Paranasal sinuses are largely clear. No significant mastoid effusion. Other: 1.8 cm T2 hypointense lesion noted within the posterior aspect of the right parotid gland, indeterminate. IMPRESSION: 1. Acute to early subacute right PCA territory infarct. Associated mild petechial blood products without frank hemorrhagic transformation or significant regional mass effect. 2. Underlying moderate chronic microvascular ischemic disease. 3. 1.8 cm T2 hypointense lesion within the posterior aspect of the right parotid gland, indeterminate.  Nonemergent outpatient ENT referral for further workup and evaluation suggested. Electronically Signed   By: Jeannine Boga M.D.   On: 03/06/2022 00:23   CT ANGIO HEAD NECK W WO CM  Result Date: 03/05/2022 CLINICAL DATA:  Diplopia EXAM: CT ANGIOGRAPHY HEAD AND NECK TECHNIQUE: Multidetector CT imaging of the head and neck was performed using the standard protocol during bolus administration of intravenous contrast. Multiplanar CT image reconstructions and MIPs were obtained to evaluate the vascular anatomy. Carotid stenosis measurements (when applicable) are obtained utilizing NASCET criteria, using the distal internal carotid diameter as the denominator. RADIATION DOSE REDUCTION: This exam was performed according to the departmental dose-optimization program which includes automated exposure control, adjustment of the mA and/or kV according to patient size and/or use of iterative reconstruction technique. CONTRAST:  59m OMNIPAQUE IOHEXOL 350 MG/ML SOLN COMPARISON:  CT head 12/07/2021. FINDINGS: CT HEAD FINDINGS Brain: Acute or early subacute infarct in the right occipital lobe (PCA territory). Mild sulcal effacement is region without significant mass effect. No midline shift. No evidence of acute hemorrhage, mass lesion, or hydrocephalus. Vascular: See below. Skull: No acute fracture. Sinuses/Orbits: Clear sinuses.  No acute orbital findings. Other: No mastoid effusions. Review of the MIP images confirms the above findings CTA NECK FINDINGS Aortic arch: Great vessel origins are patent. Right carotid system: Patent. Calcific atherosclerosis at the carotid bifurcation without significant stenosis. Left carotid system: Patent.  No significant stenosis. Vertebral arteries: Left dominant. Both vertebral arteries are patent without significant stenosis. Skeleton: Multilevel degenerative change. Other neck: Approximately 1.6 cm partially calcified lesion in the posterior aspect of the right parotid gland with  multiple surrounding smaller lesions. Upper chest: Limited motion limited assessment without consolidation. Review of the MIP images confirms the above findings CTA HEAD FINDINGS Anterior circulation: Bilateral intracranial ICAs, MCAs, and ACAs are patent without proximal high-grade stenosis. Posterior circulation: Bilateral intradural vertebral arteries, and basilar artery are patent without significant stenosis. Proximally, the post rib arteries are patent. Suspected high-grade stenosis versus occlusion of the distal (mid to distal P3) right PCA (for example see series 15, image 22). Venous sinuses: As permitted by contrast timing, patent. Review of the MIP images confirms the above findings IMPRESSION: 1. Acute or early subacute infarct in the right occipital lobe (PCA territory). 2. Suspected high-grade stenosis versus occlusion of the distal (mid to distal P3) right PCA (for example see series 15, image 22), in the region  of infarct described above. 3. Approximately 1.6 cm partially calcified lesion in the posterior aspect of the right parotid gland with multiple surrounding smaller lesions. These could potentially represent primary parotid neoplasms (either benign or malignant) or enlarged lymph nodes. Recommend outpatient ENT follow-up for management. Findings discussed with provider [place] via telephone at 7:15 p.m. Electronically Signed   By: Margaretha Sheffield M.D.   On: 03/05/2022 19:16       Modena Bellemare M.D. [facility] 03/06/2022, 10:04 AM  Available via Epic secure chat 7am-7pm After 7 pm, please refer to night coverage provider listed on amion.

## 2022-03-06 NOTE — Evaluation (Signed)
Occupational Therapy Evaluation Patient Details Name: William Barron MRN: [id number] DOB: [birth year] Today's Date: 03/06/2022   History of Present Illness 76 year old male admitted 12/22  with right-sided headache and blurred vision +Acute right PCA stroke. PMHX:  amyloidosis, multiple myeloma, history of prostate cancer, history of pancreatic cancer status post Whipple, dyslipidemia, diabetes.   Clinical Impression   PTA, pt was independent in ADL and wife assisted with IADL. Pt performing UB ADL with set-up and LB Adl with min A. Pt presenting with double vision in which he found relief from visual occlusion taping to his R eye. Pt observed to place an x to the L of the letter asked to cross out during letter cancellation. Pt with generalized weakness and decreased coordination, balance, strength, cognition, and vision. Due to significant change in functional status, recommending AIR to optimize safety and independence in ADL and IADL.      Recommendations for follow up therapy are one component of a multi-disciplinary discharge planning process, led by the attending physician.  Recommendations may be updated based on patient status, additional functional criteria and insurance authorization.   Follow Up Recommendations  Acute inpatient rehab (3hours/day)     Assistance Recommended at Discharge Frequent or constant Supervision/Assistance  Patient can return home with the following A lot of help with walking and/or transfers;A little help with bathing/dressing/bathroom;Assistance with cooking/housework;Direct supervision/assist for financial management;Direct supervision/assist for medications management;Assist for transportation;Help with stairs or ramp for entrance;Assistance with feeding    Functional Status Assessment  Patient has had a recent decline in their functional status and demonstrates the ability to make significant improvements in function in a reasonable and predictable  amount of time.  Equipment Recommendations  Other (comment) (defer to next venue)    Recommendations for Other Services Rehab consult     Precautions / Restrictions Precautions Precautions: Fall Precaution Comments: vision impaired Restrictions Weight Bearing Restrictions: No      Mobility Bed Mobility Overal bed mobility: Needs Assistance Bed Mobility: Supine to Sit, Sit to Supine     Supine to sit: Supervision Sit to supine: Supervision   General bed mobility comments: Supervision for safety, cues for awareness for proximity to edge.    Transfers Overall transfer level: Needs assistance Equipment used: Rolling walker (2 wheels) Transfers: Sit to/from Stand Sit to Stand: Min assist           General transfer comment: Min assist for boost and balance from EOB. Cues for foot placement and hands for push up.      Balance Overall balance assessment: Needs assistance Sitting-balance support: No upper extremity supported, Feet supported Sitting balance-Leahy Scale: Fair     Standing balance support: During functional activity, Bilateral upper extremity supported Standing balance-Leahy Scale: Poor                             ADL either performed or assessed with clinical judgement   ADL Overall ADL's : Needs assistance/impaired Eating/Feeding: Minimal assistance;Sitting   Grooming: Sitting;Supervision/safety   Upper Body Bathing: Supervision/ safety;Sitting   Lower Body Bathing: Minimal assistance;Sit to/from stand   Upper Body Dressing : Supervision/safety;Sitting   Lower Body Dressing: Minimal assistance;Sit to/from stand Lower Body Dressing Details (indicate cue type and reason): Min A for rise. Donning shoes sititng EOB with supervision Toilet Transfer: Minimal assistance;Stand-pivot;Rolling walker (2 wheels);BSC/3in1 Toilet Transfer Details (indicate cue type and reason): Min A; poor balance, dizziness  Functional mobility  during ADLs: Minimal assistance;Rolling walker (2 wheels) General ADL Comments: intention tremors at baseline;     Vision Baseline Vision/History: 1 Wears glasses Ability to See in Adequate Light: 0 Adequate Patient Visual Report: Diplopia Vision Assessment?: Yes Eye Alignment: Impaired (comment) Ocular Range of Motion: Other (comment) (R eye does not abduct past midline) Tracking/Visual Pursuits: Right eye does not track laterally Visual Fields: Impaired-to be further tested in functional context (decreaed peripheral vision laterally on both eyes) Diplopia Assessment: Disappears with one eye closed;Objects split side to side Additional Comments: tapong to R eye... Pt reports no continued double vision with taping     Perception Perception Perception Tested?: No   Praxis Praxis Praxis tested?: Not tested    Pertinent Vitals/Pain Pain Assessment Pain Assessment: No/denies pain     Hand Dominance  (Amb (mostly Rt))   Extremity/Trunk Assessment Upper Extremity Assessment Upper Extremity Assessment: Generalized weakness;RUE deficits/detail RUE Deficits / Details: decr finger opposition as compared to L   Lower Extremity Assessment Lower Extremity Assessment: Defer to PT evaluation RLE Deficits / Details: Rt ankle DF 3+/5, quad and hip flexor 4/5 RLE Sensation: WNL       Communication Communication Communication: HOH   Cognition Arousal/Alertness: Awake/alert Behavior During Therapy: WFL for tasks assessed/performed Overall Cognitive Status: Impaired/Different from baseline Area of Impairment: Following commands, Problem solving, Awareness, Attention, Memory                   Current Attention Level: Sustained Memory: Decreased short-term memory Following Commands: Follows one step commands consistently, Follows one step commands with increased time, Follows multi-step commands inconsistently   Awareness: Emergent Problem Solving: Slow processing, Difficulty  sequencing, Requires verbal cues General Comments: slow processing and inconsistently follows multistep commands. Decresed awareness of deficits     General Comments  wife present and supportive. Concered about course of CVA recovery    Exercises     Shoulder Instructions      Home Living Family/patient expects to be discharged to:: Unsure Living Arrangements: Spouse/significant other Available Help at Discharge: Family;Available 24 hours/day Type of Home: House Home Access: Stairs to enter [employer] of Steps: 4 Entrance Stairs-Rails: Right Home Layout: Two level;1/2 bath on main level Alternate Level Stairs-Number of Steps: has chair lift Alternate Level Stairs-Rails: Left Bathroom Shower/Tub: Occupational psychologist: Standard Bathroom Accessibility: Yes   Home Equipment: Advice worker (2 wheels);Rollator (4 wheels);BSC/3in1;Wheelchair - manual   Additional Comments: Using RW to ambulate at home. Wife cooks meals.      Prior Functioning/Environment Prior Level of Function : Needs assist;History of Falls (last six months)             Mobility Comments: Ambulating with RW at home, one fall 3 mo ago. ADLs Comments: Wife assists with cooking/cleaning. Pt able to bath dress ind.        OT Problem List: Decreased strength;Decreased activity tolerance;Impaired balance (sitting and/or standing);Impaired vision/perception;Decreased coordination;Decreased cognition;Decreased safety awareness;Decreased knowledge of use of DME or AE      OT Treatment/Interventions: Self-care/ADL training;Therapeutic exercise;DME and/or AE instruction;Therapeutic activities;Visual/perceptual remediation/compensation;Patient/family education;Balance training;Cognitive remediation/compensation    OT Goals(Current goals can be found in the care plan section) Acute Rehab OT Goals Patient Stated Goal: no vision difficulty OT Goal Formulation: With patient Time  For Goal Achievement: 03/20/22 Potential to Achieve Goals: Good  OT Frequency: Min 2X/week    Co-evaluation              AM-PAC  OT "6 Clicks" Daily Activity     Outcome Measure Help from another person eating meals?: A Little Help from another person taking care of personal grooming?: A Little Help from another person toileting, which includes using toliet, bedpan, or urinal?: A Little Help from another person bathing (including washing, rinsing, drying)?: A Little Help from another person to put on and taking off regular upper body clothing?: A Little Help from another person to put on and taking off regular lower body clothing?: A Little 6 Click Score: 18   End of Session Equipment Utilized During Treatment: Gait belt;Rolling walker (2 wheels);Other (comment) (Pt's AFO RLE) Nurse Communication: Mobility status  Activity Tolerance: Patient tolerated treatment well Patient left: in bed;with call bell/phone within reach;with bed alarm set;with family/visitor present  OT Visit Diagnosis: Unsteadiness on feet (R26.81);Muscle weakness (generalized) (M62.81);Other abnormalities of gait and mobility (R26.89);History of falling (Z91.81);Other symptoms and signs involving cognitive function;Low vision, both eyes (H54.2);Dizziness and giddiness (R42)                Time: 6606-3016 OT Time Calculation (min): 53 min Charges:  OT General Charges $OT Visit: 1 Visit OT Evaluation $OT Eval Moderate Complexity: 1 Mod OT Treatments $Self Care/Home Management : 23-37 mins $Therapeutic Activity: 8-22 mins  Magnus Ivan, OTD, OTR/L [facility] Acute Rehabilitation Office: [phone]   Magnus Ivan 03/06/2022, 5:46 PM

## 2022-03-06 NOTE — ED Notes (Signed)
Pt transported to vascular.  °

## 2022-03-06 NOTE — Progress Notes (Cosign Needed)
STROKE TEAM PROGRESS NOTE   INTERVAL HISTORY Patient was evaluated in ED hallway. Has notable R CN VI palsy and bilaterally impaired finger-nose-finger test, but otherwise no focal neurologic deficits. Patient is hard of hearing and wears hearing aids.  Vitals:   03/06/22 0400 03/06/22 0430 03/06/22 0500 03/06/22 0548  BP: 127/69 124/69 129/73   Pulse: 72 70 72   Resp: _0 Temp:    98.3 F (36.8 C)  TempSrc:    Oral  SpO2: 100% 100% 100%   Weight:      Height:       CBC:  Recent Labs  Lab 03/05/22 1632 03/05/22 1725  WBC 7.0  --   NEUTROABS 5.2  --   HGB 10.4* 11.9*  HCT 34.1* 35.0*  MCV 78.4*  --   PLT 419*  --    Basic Metabolic Panel:  Recent Labs  Lab 03/05/22 1632 03/05/22 1725 03/06/22 0615  NA 139 141 138  K 3.2* 3.3* 3.3*  CL 106 104 108  CO2 23  --  22  GLUCOSE 128* 120* 63*  BUN _1 CREATININE 0.98 0.80 0.89  CALCIUM 9.1  --  8.5*   Lipid Panel:  Recent Labs  Lab 03/05/22 2356  CHOL 119  TRIG 75  HDL 38*  CHOLHDL 3.1  VLDL 15  LDLCALC 66   HgbA1c: No results for input(s): "HGBA1C" in the last 168 hours. Urine Drug Screen:  Recent Labs  Lab 03/05/22 2356  LABOPIA POSITIVE*  COCAINSCRNUR NONE DETECTED  LABBENZ NONE DETECTED  AMPHETMU NONE DETECTED  THCU NONE DETECTED  LABBARB NONE DETECTED    Alcohol Level  Recent Labs  Lab 03/05/22 2356  ETH <10    IMAGING past 24 hours MR BRAIN WO CONTRAST  Result Date: 03/06/2022 CLINICAL DATA:  Initial evaluation for neuro deficit, stroke suspected. EXAM: MRI HEAD WITHOUT CONTRAST TECHNIQUE: Multiplanar, multiecho pulse sequences of the brain and surrounding structures were obtained without intravenous contrast. COMPARISON:  Prior CT from earlier the same day. FINDINGS: Brain: Cerebral volume within normal limits. Patchy T2/FLAIR hyperintensity involving the periventricular deep white matter both cerebral hemispheres, most consistent with chronic small vessel ischemic disease,  moderate in nature. Confluent restricted diffusion involving the right occipital lobe, consistent with an acute to early subacute right PCA territory infarct. Associated mild petechial blood products without frank hemorrhagic transformation. No significant regional mass effect. No other evidence for acute or subacute ischemia. Gray-white matter differentiation otherwise maintained. No other acute or chronic intracranial blood products. No mass lesion, midline shift or mass effect. No hydrocephalus or extra-axial fluid collection. Pituitary gland and suprasellar region within normal limits. Vascular: Major intracranial vascular flow voids are maintained. Skull and upper cervical spine: Craniocervical junction normal. Bone marrow signal intensity within normal limits. No scalp soft tissue abnormality. Sinuses/Orbits: Globes and orbital soft tissues demonstrate no acute finding. Paranasal sinuses are largely clear. No significant mastoid effusion. Other: 1.8 cm T2 hypointense lesion noted within the posterior aspect of the right parotid gland, indeterminate. IMPRESSION: 1. Acute to early subacute right PCA territory infarct. Associated mild petechial blood products without frank hemorrhagic transformation or significant regional mass effect. 2. Underlying moderate chronic microvascular ischemic disease. 3. 1.8 cm T2 hypointense lesion within the posterior aspect of the right parotid gland, indeterminate. Nonemergent outpatient ENT referral for further workup and evaluation suggested. Electronically Signed   By: Jeannine Boga M.D.   On: 03/06/2022 00:23   CT ANGIO HEAD NECK  W WO CM  Result Date: 03/05/2022 CLINICAL DATA:  Diplopia EXAM: CT ANGIOGRAPHY HEAD AND NECK TECHNIQUE: Multidetector CT imaging of the head and neck was performed using the standard protocol during bolus administration of intravenous contrast. Multiplanar CT image reconstructions and MIPs were obtained to evaluate the vascular anatomy.  Carotid stenosis measurements (when applicable) are obtained utilizing NASCET criteria, using the distal internal carotid diameter as the denominator. RADIATION DOSE REDUCTION: This exam was performed according to the departmental dose-optimization program which includes automated exposure control, adjustment of the mA and/or kV according to patient size and/or use of iterative reconstruction technique. CONTRAST:  35m OMNIPAQUE IOHEXOL 350 MG/ML SOLN COMPARISON:  CT head 12/07/2021. FINDINGS: CT HEAD FINDINGS Brain: Acute or early subacute infarct in the right occipital lobe (PCA territory). Mild sulcal effacement is region without significant mass effect. No midline shift. No evidence of acute hemorrhage, mass lesion, or hydrocephalus. Vascular: See below. Skull: No acute fracture. Sinuses/Orbits: Clear sinuses.  No acute orbital findings. Other: No mastoid effusions. Review of the MIP images confirms the above findings CTA NECK FINDINGS Aortic arch: Great vessel origins are patent. Right carotid system: Patent. Calcific atherosclerosis at the carotid bifurcation without significant stenosis. Left carotid system: Patent.  No significant stenosis. Vertebral arteries: Left dominant. Both vertebral arteries are patent without significant stenosis. Skeleton: Multilevel degenerative change. Other neck: Approximately 1.6 cm partially calcified lesion in the posterior aspect of the right parotid gland with multiple surrounding smaller lesions. Upper chest: Limited motion limited assessment without consolidation. Review of the MIP images confirms the above findings CTA HEAD FINDINGS Anterior circulation: Bilateral intracranial ICAs, MCAs, and ACAs are patent without proximal high-grade stenosis. Posterior circulation: Bilateral intradural vertebral arteries, and basilar artery are patent without significant stenosis. Proximally, the post rib arteries are patent. Suspected high-grade stenosis versus occlusion of the distal  (mid to distal P3) right PCA (for example see series 15, image 22). Venous sinuses: As permitted by contrast timing, patent. Review of the MIP images confirms the above findings IMPRESSION: 1. Acute or early subacute infarct in the right occipital lobe (PCA territory). 2. Suspected high-grade stenosis versus occlusion of the distal (mid to distal P3) right PCA (for example see series 15, image 22), in the region of infarct described above. 3. Approximately 1.6 cm partially calcified lesion in the posterior aspect of the right parotid gland with multiple surrounding smaller lesions. These could potentially represent primary parotid neoplasms (either benign or malignant) or enlarged lymph nodes. Recommend outpatient ENT follow-up for management. Findings discussed with provider [place] via telephone at 7:15 p.m. Electronically Signed   By: FMargaretha SheffieldM.D.   On: 03/05/2022 19:16    PHYSICAL EXAM General: well-appearing and in no acute distress HEENT: normocephalic and atraumatic Respiratory: normal respiratory effort and on RA Gastrointestinal: non-tender and non-distended Extremities: moving all extremities spontaneously  Mental Status: William HOAREis alert; he is oriented to person, place, time, and situation. Speech was clear and fluent without evidence of aphasia. He was able to follow 3 step commands without difficulty.  Cranial Nerves: II:  Visual fields mildly impaired III,IV, VI: no ptosis, extra-ocular motions  with notable R CN VI palsy V,VII: smile symmetric, facial light touch sensation intact bilaterally VIII: hearing grossly diminished XII: midline tongue extension without atrophy and without fasciculations  Motor: Right : Upper extremity   5/5 full power  Lower extremity   5/5 full power  Left: Upper extremity   5/5 full power Lower extremity  5/5 full power  Tone and bulk: normal tone throughout; no atrophy noted  Sensory: sensation to light touch intact  throughout bilaterally  Cerebellar: Finger-to-nose test impaired  Gait: not observed during encounter  ASSESSMENT/PLAN 76 year old male history of amyloidosis, multiple myeloma, history of prostate cancer, history of pancreatic cancer status post Whipple, dyslipidemia, diabetes insulin requiring presents to the ER today with right-sided headache and blurred vision.  Last known well was yesterday evening 03/04/2022.  Patient states he woke up around 6:00 this morning.  He states that he had difficulty seeing out of his right eye.  He went to see an ophthalmologist who referred him to the ER. CT scan of the head in the ER demonstrated a acute/subacute right occipital stroke.  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  #Acute / subacute R PCA stroke CTA head & neck showed acute or early subacute infarct in the right occipital lobe (PCA territory). Suspected high-grade stenosis versus occlusion of the distal (mid to distal P3) right PCA (for example see series 15, image 22), in the region of infarct described above. Approximately 1.6 cm partially calcified lesion in the posterior aspect of the right parotid gland with multiple surrounding smaller lesions. These could potentially represent primary parotid neoplasms (either benign or malignant) or enlarged lymph nodes. Recommend outpatient ENT follow-up for management. MRI  showed acute to early subacute right PCA territory infarct. Associated mild petechial blood products without frank hemorrhagic transformation or significant regional mass effect. Underlying moderate chronic microvascular ischemic disease.1.8 cm T2 hypointense lesion within the posterior aspect of the right parotid gland, indeterminate. Nonemergent outpatient ENT referral for further workup and evaluation suggested. Vas [place] LE pending 2D Echo pending LDL 66 HgbA1c 7.4 VTE prophylaxis - SCDs    Diet   Diet NPO time specified   No antithrombotic prior to  admission, now on aspirin 81 mg daily and clopidogrel 75 mg daily. Continue aspirin 81 mg and clopidogrel 75 mg daily for 21 days, then aspirin 81 mg alone daily indefinitely Therapy recommendations:  pending evaluation Disposition:  pending therapy recs, medical clearance  Diabetes type II Uncontrolled Home meds:  insulin HgbA1c 7.4, goal < 7.0 CBGs Recent Labs    03/05/22 1759 03/05/22 1905  GLUCAP 94 75    SSI  Other Stroke Risk Factors Advanced Age >/= 32  Other Active Problems per primary team Parotid gland lesion Multiple myeloma Amyloidosis  Hospital day # 0  To contact Stroke Continuity provider, please refer to [email or web address] After hours, contact General Neurology

## 2022-03-06 NOTE — Progress Notes (Signed)
Echocardiogram 2D Echocardiogram has been performed.  William Barron 03/06/2022, 2:33 PM

## 2022-03-06 NOTE — Progress Notes (Signed)
OT Cancellation Note  Patient Details Name: William Barron MRN: [id number] DOB: 10/24/1945   Cancelled Treatment:    Reason Eval/Treat Not Completed: Patient at procedure or test/ unavailable (vascular) OT located wife in ED and helped to 3w14 unit with patient still in Vascular. OT to check back at the next date / time available.  Jeri Modena 03/06/2022, 2:06 PM

## 2022-03-06 NOTE — Progress Notes (Signed)
VASCULAR LAB    Bilateral lower extremity venous duplex has been performed.  See CV proc for preliminary results.   Texie Tupou, RVT 03/06/2022, 1:24 PM

## 2022-03-07 ENCOUNTER — Inpatient Hospital Stay (HOSPITAL_COMMUNITY): Payer: No Typology Code available for payment source

## 2022-03-07 DIAGNOSIS — E119 Type 2 diabetes mellitus without complications: Secondary | ICD-10-CM | POA: Diagnosis not present

## 2022-03-07 DIAGNOSIS — I5021 Acute systolic (congestive) heart failure: Secondary | ICD-10-CM

## 2022-03-07 DIAGNOSIS — E8589 Other amyloidosis: Secondary | ICD-10-CM | POA: Diagnosis not present

## 2022-03-07 DIAGNOSIS — I639 Cerebral infarction, unspecified: Secondary | ICD-10-CM | POA: Diagnosis not present

## 2022-03-07 LAB — GLUCOSE, CAPILLARY
Glucose-Capillary: 216 mg/dL — ABNORMAL HIGH (ref 70–99)
Glucose-Capillary: 175 mg/dL — ABNORMAL HIGH (ref 70–99)
Glucose-Capillary: 240 mg/dL — ABNORMAL HIGH (ref 70–99)

## 2022-03-07 LAB — RESP PANEL BY RT-PCR (RSV, FLU A&B, COVID)  RVPGX2
Influenza A by PCR: POSITIVE — AB
Influenza B by PCR: NEGATIVE
Resp Syncytial Virus by PCR: NEGATIVE
SARS Coronavirus 2 by RT PCR: NEGATIVE

## 2022-03-07 LAB — CBC
HCT: 29.7 % — ABNORMAL LOW (ref 39.0–52.0)
Hemoglobin: 9.3 g/dL — ABNORMAL LOW (ref 13.0–17.0)
MCH: 24 pg — ABNORMAL LOW (ref 26.0–34.0)
MCHC: 31.3 g/dL (ref 30.0–36.0)
MCV: 76.5 fL — ABNORMAL LOW (ref 80.0–100.0)
Platelets: 334 10*3/uL (ref 150–400)
RBC: 3.88 MIL/uL — ABNORMAL LOW (ref 4.22–5.81)
RDW: 17.9 % — ABNORMAL HIGH (ref 11.5–15.5)
WBC: 7 10*3/uL (ref 4.0–10.5)
nRBC: 0 % (ref 0.0–0.2)

## 2022-03-07 LAB — BASIC METABOLIC PANEL
Anion gap: 8 (ref 5–15)
BUN: 10 mg/dL (ref 8–23)
CO2: 22 mmol/L (ref 22–32)
Calcium: 8.5 mg/dL — ABNORMAL LOW (ref 8.9–10.3)
Chloride: 107 mmol/L (ref 98–111)
Creatinine, Ser: 0.98 mg/dL (ref 0.61–1.24)
GFR, Estimated: >60 mL/min (ref >60)
Glucose, Bld: 184 mg/dL — ABNORMAL HIGH (ref 70–99)
Potassium: 3.4 mmol/L — ABNORMAL LOW (ref 3.5–5.1)
Sodium: 137 mmol/L (ref 135–145)

## 2022-03-07 LAB — MAGNESIUM: Magnesium: 1.7 mg/dL (ref 1.7–2.4)

## 2022-03-07 LAB — BRAIN NATRIURETIC PEPTIDE: B Natriuretic Peptide: 1326.2 pg/mL — ABNORMAL HIGH (ref 0.0–100.0)

## 2022-03-07 LAB — PROCALCITONIN: Procalcitonin: <0.1 ng/mL

## 2022-03-07 MED ORDER — IPRATROPIUM-ALBUTEROL 0.5-2.5 (3) MG/3ML IN SOLN: 3.0000 mL | RESPIRATORY_TRACT | Status: DC | PRN

## 2022-03-07 MED ORDER — INSULIN GLARGINE-YFGN 100 UNIT/ML ~~LOC~~ SOLN: 7.0000 [IU] | Freq: Every day | SUBCUTANEOUS | Status: DC

## 2022-03-07 MED ORDER — CARVEDILOL 3.125 MG PO TABS
3.1250 mg | ORAL_TABLET | Freq: Two times a day (BID) | ORAL | Status: DC
  Administered 2022-03-07 – 2022-03-11 (×9): 3.125 mg via ORAL
  Filled 2022-03-07 (×9): qty 1

## 2022-03-07 MED ORDER — FUROSEMIDE 10 MG/ML IJ SOLN
20.0000 mg | Freq: Once | INTRAMUSCULAR | Status: AC
  Administered 2022-03-07: 20 mg via INTRAVENOUS
  Filled 2022-03-07: qty 4

## 2022-03-07 MED ORDER — IPRATROPIUM-ALBUTEROL 0.5-2.5 (3) MG/3ML IN SOLN
3.0000 mL | Freq: Four times a day (QID) | RESPIRATORY_TRACT | Status: DC
  Administered 2022-03-07: 3 mL via RESPIRATORY_TRACT
  Filled 2022-03-07: qty 3

## 2022-03-07 MED ORDER — OSELTAMIVIR PHOSPHATE 30 MG PO CAPS
30.0000 mg | ORAL_CAPSULE | Freq: Two times a day (BID) | ORAL | Status: AC
  Administered 2022-03-07 – 2022-03-11 (×9): 30 mg via ORAL
  Filled 2022-03-07 (×11): qty 1

## 2022-03-07 MED ORDER — POTASSIUM CHLORIDE CRYS ER 20 MEQ PO TBCR
40.0000 meq | EXTENDED_RELEASE_TABLET | Freq: Once | ORAL | Status: AC
  Administered 2022-03-07: 40 meq via ORAL
  Filled 2022-03-07: qty 2

## 2022-03-07 MED ORDER — INSULIN GLARGINE-YFGN 100 UNIT/ML ~~LOC~~ SOLN
8.0000 [IU] | Freq: Every day | SUBCUTANEOUS | Status: DC
  Administered 2022-03-07 – 2022-03-09 (×3): 8 [IU] via SUBCUTANEOUS
  Filled 2022-03-07 (×3): qty 0.08

## 2022-03-07 MED ORDER — INSULIN ASPART 100 UNIT/ML IJ SOLN
3.0000 [IU] | Freq: Three times a day (TID) | INTRAMUSCULAR | Status: DC
  Administered 2022-03-07 – 2022-03-19 (×12): 3 [IU] via SUBCUTANEOUS

## 2022-03-07 MED ORDER — CHLORHEXIDINE GLUCONATE CLOTH 2 % EX PADS
6.0000 | MEDICATED_PAD | Freq: Every day | CUTANEOUS | Status: DC
  Administered 2022-03-07 – 2022-03-11 (×5): 6 via TOPICAL

## 2022-03-07 MED ORDER — ASPIRIN 325 MG PO TBEC
325.0000 mg | DELAYED_RELEASE_TABLET | Freq: Every day | ORAL | Status: DC
  Administered 2022-03-07 – 2022-03-10 (×4): 325 mg via ORAL
  Filled 2022-03-07 (×4): qty 1

## 2022-03-07 NOTE — Consult Note (Signed)
Cardiology Consultation   Patient ID: William Barron MRN: [id number]; DOB: Nov 04, 1945  Admit date: 03/05/2022 Date of Consult: 03/07/2022  PCP:  William Barron, [place] [facility] Cardiologist:  William Breeding, MD   {  Patient Profile:   William Barron is a 76 y.o. male with a history of mild non-obstructive CAD noted on cardiac catheterization in 05/2015, mild to moderate aortic stenosis/ insufficiency, moderate pulmonic regurgitation, chronic lower extremity edema, hyperlipidemia, type 2 diabetes mellitus, pancreatic cyst s/p Whipple in 07/2021 at [facility], and multiple myeloma/ AL amyloidosis followed by Oncology at [facility] who was admitted on 03/05/2022 with an occipital stroke after presenting with vision changes. Echo showed new reduced LVEF of 30-35%. Cardiology consulted for further evaluation of new cardiomyopathy at the request of Dr. Tana Barron.  History of Present Illness:   William Barron is a 76 year old male with the above history who is followed by Dr. Percival Barron. LHC in 05/2015 showed mild non-obstructive CAD with 20-25% stenosis of left main, LAD, and LCX. EF normal on LV gram at that time. He also has known valvular disease. In addition, he has known multiple myeloma and AL amyloidosis which is followed by Oncology at [facility]. Previously on  Daratumumab, Ixazomib, and Dexamethasone. He underwent a Whipple procedure in 07/2021 at [facility] for an intraductal papillary mucinous neoplasm. Pathology demonstrated changes of amyloidosis and benign cyst. Hospitalization was complicated by delayed gastric emptying requiring GJ tube with multiple exchanges. Since his Whipple, he has had problems with right foot drop and pain and has been ambulating with a walker. Last Echo in 11/2021 showed LVEF of 55-60% with no regional wall motion abnormalities and grade 1 diastolic dysfunction, normal RV, mild to moderate aortic stenosis/ insufficiency with mean gradient of 16.0 mmHg, and moderate  pulmonic regurgitation. Patient was last seen by Dr. Percival Barron on 01/28/2022 at which time he noted increased lower extremity edema that did not respond much to Lasix but no other cardiac symptoms. Edema was managed conservatively and compression stockings were recommended.  Patient presented to the ED on 03/05/2022 for further evaluation of right sided headache and vision changes. Head/neck CTA showed acute or early subacute infarct in the the right occipital lobe (PCA territory) and suspect high-grade stenosis vs occlusion of the distal (mid to distal P3) right PCA. Brain MRI also showed acute to early subacute right PCA territory infarct with associated mild petechial blood products without frank hemorrhagic transformation or significant regional mass effect. MRI also showed a hypointense lesion within the posterior aspect of the right parotid gland. Patient was admitted for acute stroke and Neurology consulted. He was started on DAPT with Aspirin and Plavix. Plan is to continue DAPT for 21 days and then transition to Aspirin alone. Echo showed newly reduced LVEF of 30-35% with global hypokinesis and grade 2 diastolic dysfunction, normal RV, mild aortic stenosis/ insufficiency, mild mitral regurgitation, mild to moderate pulmonic regurgitation, and a small pericardial effusion. Cardiology was consulted for further evaluation of his new cardiomyopathy.  At the time of this evaluation, patient is resting comfortably in no acute distress. He denies any chest pain prior to admission but does report dyspnea on exertion over the last couple of months. He states he used to exercise on his recumbent bike but is no unable to do this due to dyspnea. He also reports dyspnea with some routine activities around his house. It sounds like he has had a gradual decline since his Whipple procedure in 07/2021.  He denies any dyspnea at rest. He has slept on 2-4 pillow for years due to comfort but it does not sound like he has  been having any real orthopnea. He does report occasionally waking up feeling short of breath (possible PND). He has had lower extremity edema in the past but states it has improved recently and seems to be well controlled with compression stockings. He denies any palpitations. He does report some lightheadedness/ dizziness associated with his acute dyspnea when using his recumbent bike but no syncope. No recent fevers or illnesses. He has developed a productive cough here in the hospital (started yesterday) but no other URI symptoms. No nausea, vomiting, or diarrhea.  Past Medical History:  Diagnosis Date   Amyloidosis ([place])    Diabetes mellitus without complication ([place])    Feeding tube dysfunction, initial encounter 08/12/2021   GERD (gastroesophageal reflux disease)    Hearing loss    Has hearing aids   History of kidney stones    Hyperlipidemia    Jejunostomy tube present ([place])    Multiple myeloma ([facility])    and amylodosis    Nephrolithiasis    Occasional tremors    Pancreatic insufficiency    Pneumonia    Prostate cancer ([place])    Prostatitis    acute and chronic   Skin cancer     Past Surgical History:  Procedure Laterality Date   APPENDECTOMY     CARDIAC CATHETERIZATION N/A 06/13/2015   Procedure: Left Heart Cath and Coronary Angiography;  Surgeon: Jettie Booze, MD;  Location: [facility];  Service: Cardiovascular;  Laterality: N/A;   COLONOSCOPY  04/07/2000   ELBOW SURGERY  03/15/2001   right   ESOPHAGOGASTRODUODENOSCOPY (EGD) WITH PROPOFOL N/A 09/07/2021   Procedure: ESOPHAGOGASTRODUODENOSCOPY (EGD) WITH PROPOFOL;  Surgeon: Irene Shipper, MD;  Location: [facility];  Service: Gastroenterology;  Laterality: N/A;   EXTRACORPOREAL SHOCK WAVE LITHOTRIPSY Left 07/17/2018   Procedure: EXTRACORPOREAL SHOCK WAVE LITHOTRIPSY (ESWL);  Surgeon: [name], MD;  Location: [facility];  Service: Urology;  Laterality: Left;   EXTRACORPOREAL SHOCK WAVE LITHOTRIPSY Right  12/08/2020   Procedure: RIGHT EXTRACORPOREAL SHOCK WAVE LITHOTRIPSY (ESWL);  Surgeon: Raynelle Bring, MD;  Location: [facility];  Service: Urology;  Laterality: Right;   FOOT ARTHRODESIS Right 04/15/2021   Procedure: RIGHT SUBTALAR AND TALONAVICULAR FUSION;  Surgeon: Newt Minion, MD;  Location: [facility];  Service: Orthopedics;  Laterality: Right;   HERNIA REPAIR     IR MECH REMOV OBSTRUC MAT ANY COLON TUBE W/FLUORO  08/27/2021   IR [place] GASTRO/COLONIC TUBE PERCUT W/FLUORO  09/08/2021   ROTATOR CUFF REPAIR     WHIPPLE PROCEDURE  07/13/2021     Home Medications:  Prior to Admission medications   Medication Sig Start Date End Date Taking? Authorizing Provider  Cyanocobalamin (B-12 PO) Take 1 tablet by mouth in the morning.   Yes [provider]  insulin aspart (NOVOLOG FLEXPEN) 100 UNIT/ML FlexPen Inject 8 Units into the skin 3 (three) times daily before meals.   Yes [provider]  lipase/protease/amylase (CREON) 36000 UNITS CPEP capsule Take 3 capsules (108,000 Units total) by mouth 3 (three) times daily with meals. May also take 1 capsule (36,000 Units total) as needed (with snacks). Patient taking differently: Take 2-3 capsules (72000-108000 units) by mouth 3 (three) times daily with meals. May also take 1 capsule (36,000 units) as needed (with snacks). 01/14/22  Yes Gatha Mayer, MD  pantoprazole sodium (PROTONIX) 40 mg Take  40 mg by mouth daily. Patient taking differently: Take 40 mg by mouth in the morning. 01/14/22  Yes Gatha Mayer, MD  Testosterone 20.25 MG/ACT (1.62%) GEL Apply 4 Pump topically at bedtime.   Yes [provider]  Vibegron (GEMTESA) 75 MG TABS Take 75 mg by mouth in the morning.   Yes [provider]  BD PEN NEEDLE NANO U/F 32G X 4 MM MISC USE AS DIRECTED WITH INSULIN PEN THREE TIMES DAILY 01/27/22   Elayne Snare, MD  Continuous Blood Gluc Receiver (FREESTYLE LIBRE 2 READER) DEVI Use as instructed to check blood  sugars 11/20/21   Elayne Snare, MD  Continuous Blood Gluc Sensor (FREESTYLE LIBRE 3 SENSOR) MISC Place 1 sensor on the skin every 14 days. Use to check glucose continuously 03/05/22   Elayne Snare, MD  insulin degludec (TRESIBA FLEXTOUCH) 100 UNIT/ML FlexTouch Pen Inject 15 Units into the skin daily. Patient taking differently: Inject 6 Units into the skin in the morning. 11/17/21   Elayne Snare, MD    Inpatient Medications: Scheduled Meds:  aspirin EC  325 mg Oral Daily   clopidogrel  75 mg Oral Daily   insulin aspart  0-9 Units Subcutaneous TID WC   insulin glargine-yfgn  4 Units Subcutaneous Daily   lipase/protease/amylase  72,000 Units Oral TID WC   mirabegron ER  25 mg Oral Daily   pantoprazole (PROTONIX) IV  40 mg Intravenous Q24H   Continuous Infusions:  PRN Meds: acetaminophen **OR** acetaminophen (TYLENOL) oral liquid 160 mg/5 mL **OR** acetaminophen  Allergies:   No Known Allergies  Social History:   Social History   Socioeconomic History   Marital status: Married    Spouse name: Not on file   Number of children: 2   Years of education: Not on file   Highest education level: Not on file  Occupational History   Occupation: retired  Tobacco Use   Smoking status: Former    Packs/day: 3.00    Years: 20.00    Total pack years: 60.00    Types: Cigarettes    Quit date: 03/15/1977    Years since quitting: 45.0   Smokeless tobacco: Never   Tobacco comments:    quit 35 years ago  Vaping Use   Vaping Use: Never used  Substance and Sexual Activity   Alcohol use: Yes    Alcohol/week: 3.0 standard drinks of alcohol    Types: 3 Glasses of wine per week    Comment: approx 1 drink/night   Drug use: No   Sexual activity: Not Currently    Birth control/protection: None  Other Topics Concern   Not on file  Social History Narrative   Married, 2 children   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  Review  of Systems  Constitutional:  Negative for fever.  HENT:  Negative for congestion.   Eyes:  Positive for blurred vision.  Respiratory:  Positive for cough, sputum production and shortness of breath.   Cardiovascular:  Positive for leg swelling (well controlled with compression stockings) and PND. Negative for chest pain, palpitations and orthopnea.  Gastrointestinal:  Negative for blood in stool, diarrhea, melena, nausea and vomiting.  Genitourinary:  Negative for hematuria.  Musculoskeletal:  Negative for myalgias.  Neurological:  Positive for dizziness and headaches.  Endo/Heme/Allergies:  Does not bruise/bleed easily.  Psychiatric/Behavioral:  Substance abuse: remote smoking history.     Physical Exam/Data:   Vitals:   03/06/22 2338 03/07/22 0335 03/07/22 0600 03/07/22 0728  BP: (!) 127/51 (!) 149/84  (!) 121/59  Pulse: 89 95  81  Resp: _0 Temp: 98.4 F (36.9 C) 99.8 F (37.7 C)  98.9 F (37.2 C)  TempSrc: Oral Oral  Oral  SpO2: 96% 95%  93%  Weight:   63.6 kg   Height:        Intake/Output Summary (Last 24 hours) at 03/07/2022 0745 Last data filed at 03/07/2022 7829 Gross per 24 hour  Intake 320 ml  Output 1525 ml  Net -1205 ml      03/07/2022    6:00 AM 03/05/2022    4:19 PM 01/28/2022   11:17 AM  Last 3 Weights  Weight (lbs) 140 lb 3.4 oz 140 lb 146 lb 3.2 oz  Weight (kg) 63.6 kg 63.504 kg 66.316 kg     Body  mass index is 23.33 kg/m.  General: 76 y.o. Caucasian male resting comfortably in no acute distress. HEENT: Normocephalic and atraumatic. Sclera clear.  Neck: Supple. No JVD. Heart: RRR. III/VI systolic murmur. No gallops or rubs.  Radial  pulses 2+ and equal bilaterally. Lungs: No increased work of breathing. Scattered rhonchi noted bilateral. No significant crackles appreciated.  Abdomen: Soft, non-distended, and non-tender to palpation. Bowel sounds present. Extremities: No lower extremity edema.    Skin: Warm and dry. Neuro: Alert and oriented x3. No focal deficits. Psych: Normal affect. Responds appropriately.   EKG:  The EKG was personally reviewed and demonstrates:  Normal sinus rhythm, rate 84 bpm, with underlying artifact and non-specific ST/T changes. Right axis deviation. Normal PR and QRS intervals. QTc 441 ms.  Telemetry:  Telemetry was personally reviewed and demonstrates:  Normal sinus rhythm with rates in the 80s to 90s. Occasional PVCs.  Relevant CV Studies:  Left Cardiac Catheterization 06/13/2015: Prox Cx lesion, 25% stenosed. Prox LAD lesion, 25% stenosed. Mid LAD lesion, 25% stenosed. Ost LM to LM lesion, 20% stenosed. The left ventricular systolic function is normal.   Mild to moderate scattered atherosclerosis. No hemodynamically significant lesions. Continue aggressive medical therapy. _______________  Echocardiogram 03/06/2022: Impressions: 1. Left ventricular ejection fraction, by estimation, is 30 to 35%. Left  ventricular ejection fraction by 3D volume is 37 %. The left ventricle has  moderately decreased function. The left ventricle demonstrates global  hypokinesis. The left ventricular  internal cavity size was mildly dilated. There is mild left ventricular  hypertrophy. Left ventricular diastolic parameters are consistent with  Grade II diastolic dysfunction (pseudonormalization).   2. Right ventricular systolic function is normal. The right  ventricular  size is normal. Tricuspid regurgitation signal is inadequate for assessing  PA pressure.   3. Left atrial size was mildly dilated.   4. A small pericardial effusion is present. The pericardial effusion is  circumferential.  5. The mitral valve is grossly normal. Mild mitral valve regurgitation.  No evidence of mitral stenosis.   6. The aortic valve is abnormal. There is moderate calcification of the  aortic valve. Aortic valve regurgitation is mild. Aortic valve  sclerosis/calcification is present. Mild aortic valve stenosis noted on  prior echocardiogram. Reduced gradient on  today's exam may be secondary to low flow state, with stroke volume index  of 32 mL/m2.   7. The inferior vena cava is dilated in size with >50% respiratory  variability, suggesting right atrial pressure of 8 mmHg.   Laboratory Data:  High Sensitivity Troponin:  No results for input(s): "TROPONINIHS" in the last 720 hours.   Chemistry Recent Labs  Lab 03/05/22 1632 03/05/22 1725 03/06/22 0615  NA 139 141 138  K 3.2* 3.3* 3.3*  CL 106 104 108  CO2 23  --  22  GLUCOSE 128* 120* 63*  BUN _0 CREATININE 0.98 0.80 0.89  CALCIUM 9.1  --  8.5*  GFRNONAA >60  --  >60  ANIONGAP 10  --  8    Recent Labs  Lab 03/05/22 1632 03/06/22 0615  PROT 7.1 5.6*  ALBUMIN 3.5 2.7*  AST 39 28  ALT 33 26  ALKPHOS 727* 564*  BILITOT 0.4 0.5   Lipids  Recent Labs  Lab 03/05/22 2356  CHOL 119  TRIG 75  HDL 38*  LDLCALC 66  CHOLHDL 3.1    Hematology Recent Labs  Lab 03/05/22 1632 03/05/22 1725  WBC 7.0  --   RBC 4.35  --   HGB 10.4* 11.9*  HCT 34.1* 35.0*  MCV 78.4*  --   MCH 23.9*  --   MCHC 30.5  --   RDW 18.2*  --   PLT 419*  --    Thyroid  Recent Labs  Lab 03/05/22 2356  TSH 0.835    BNPNo results for input(s): "BNP", "PROBNP" in the last 168 hours.  DDimer No results for input(s): "DDIMER" in the last 168 hours.   Radiology/Studies:  ECHOCARDIOGRAM  COMPLETE  Result Date: 03/06/2022    ECHOCARDIOGRAM REPORT   Patient Name:   ZADIEL LEYH Date of Exam: 03/06/2022 Medical Rec #:  [id number]           Height:       65.0 in Accession #:    [id number]          Weight:       140.0 lb Date of Birth:  21-Feb-1946            BSA:          1.700 m Patient Age:    47 years            BP:           129/73 mmHg Patient Gender: M                   HR:           78 bpm. Exam Location:  Inpatient Procedure: 2D Echo, Cardiac Doppler, Color Doppler and Strain Analysis Indications:    Stroke I63.9  History:        Patient has prior history of Echocardiogram examinations, most                 recent 12/01/2021. Stroke, Arrythmias:PVC,                 Signs/Symptoms:Dyspnea; Risk Factors:Hypertension, Dyslipidemia  and Diabetes.  Sonographer:    Ronny Flurry Referring Phys: ERIC CHEN IMPRESSIONS  1. Left ventricular ejection fraction, by estimation, is 30 to 35%. Left ventricular ejection fraction by 3D volume is 37 %. The left ventricle has moderately decreased function. The left ventricle demonstrates global hypokinesis. The left ventricular internal cavity size was mildly dilated. There is mild left ventricular hypertrophy. Left ventricular diastolic parameters are consistent with Grade II diastolic dysfunction (pseudonormalization).  2. Right ventricular systolic function is normal. The right ventricular size is normal. Tricuspid regurgitation signal is inadequate for assessing PA pressure.  3. Left atrial size was mildly dilated.  4. A small pericardial effusion is present. The pericardial effusion is circumferential.  5. The mitral valve is grossly normal. Mild mitral valve regurgitation. No evidence of mitral stenosis.  6. The aortic valve is abnormal. There is moderate calcification of the aortic valve. Aortic valve regurgitation is mild. Aortic valve sclerosis/calcification is present. Mild aortic valve stenosis noted on prior echocardiogram.  Reduced gradient on today's exam may be secondary to low flow state, with stroke volume index of 32 mL/m2.  7. The inferior vena cava is dilated in size with >50% respiratory variability, suggesting right atrial pressure of 8 mmHg. FINDINGS  Left Ventricle: Left ventricular ejection fraction, by estimation, is 30 to 35%. Left ventricular ejection fraction by 3D volume is 37 %. The left ventricle has moderately decreased function. The left ventricle demonstrates global hypokinesis. Global longitudinal strain performed but not reported based on interpreter judgement due to suboptimal tracking. The left ventricular internal cavity size was mildly dilated. There is mild left ventricular hypertrophy. Left ventricular diastolic parameters are consistent with Grade II diastolic dysfunction (pseudonormalization). Right Ventricle: The right ventricular size is normal. No increase in right ventricular wall thickness. Right ventricular systolic function is normal. Tricuspid regurgitation signal is inadequate for assessing PA pressure. Left Atrium: Left atrial size was mildly dilated. Right Atrium: Right atrial size was normal in size. Pericardium: A small pericardial effusion is present. The pericardial effusion is circumferential. Mitral Valve: The mitral valve is grossly normal. Mild mitral valve regurgitation. No evidence of mitral valve stenosis. Tricuspid Valve: The tricuspid valve is normal in structure. Tricuspid valve regurgitation is trivial. No evidence of tricuspid stenosis. Aortic Valve: The aortic valve is abnormal. There is moderate calcification of the aortic valve. Aortic valve regurgitation is mild. Aortic regurgitation PHT measures 476 msec. Aortic valve sclerosis/calcification is present, without any evidence of aortic stenosis. Aortic valve mean gradient measures 8.5 mmHg. Aortic valve peak gradient measures 15.3 mmHg. Aortic valve area, by VTI measures 1.53 cm. Pulmonic Valve: The pulmonic valve was  normal in structure. Pulmonic valve regurgitation is mild to moderate based on spectral Doppler profile. No evidence of pulmonic stenosis. Aorta: The aortic root is normal in size and structure. Venous: The inferior vena cava is dilated in size with greater than 50% respiratory variability, suggesting right atrial pressure of 8 mmHg. IAS/Shunts: No atrial level shunt detected by color flow Doppler.  LEFT VENTRICLE PLAX 2D LVIDd:         6.00 cm         Diastology LVIDs:         4.90 cm         LV e' medial:    3.53 cm/s LV PW:         1.20 cm         LV E/e' medial:  19.8 LV IVS:  0.90 cm         LV e' lateral:   4.35 cm/s LVOT diam:     2.20 cm         LV E/e' lateral: 16.1 LV SV:         55 LV SV Index:   32 LVOT Area:     3.80 cm        3D Volume EF                                LV 3D EF:    Left                                             ventricul                                             ar                                             ejection                                             fraction                                             by 3D                                             volume is                                             37 %.                                 3D Volume EF:                                3D EF:        37 %                                LV EDV:       182 ml                                LV ESV:       115 ml  LV SV:        67 ml RIGHT VENTRICLE             IVC RV S prime:     12.90 cm/s  IVC diam: 2.30 cm TAPSE (M-mode): 1.9 cm LEFT ATRIUM           Index        RIGHT ATRIUM           Index LA diam:      4.10 cm 2.41 cm/m   RA Area:     14.65 cm LA Vol (A2C): 44.2 ml 26.00 ml/m  RA Volume:   34.55 ml  20.32 ml/m LA Vol (A4C): 55.3 ml 32.53 ml/m  AORTIC VALVE AV Area (Vmax):    1.59 cm AV Area (Vmean):   1.49 cm AV Area (VTI):     1.53 cm AV Vmax:           195.50 cm/s AV Vmean:          133.000 cm/s AV VTI:            0.357 m AV  Peak Grad:      15.3 mmHg AV Mean Grad:      8.5 mmHg LVOT Vmax:         81.90 cm/s LVOT Vmean:        52.100 cm/s LVOT VTI:          0.144 m LVOT/AV VTI ratio: 0.40 AI PHT:            476 msec  AORTA Ao Root diam: 3.20 cm Ao Asc diam:  3.40 cm MITRAL VALVE MV Area (PHT): 3.48 cm    SHUNTS MV Decel Time: 218 msec    Systemic VTI:  0.14 m MV E velocity: 70.00 cm/s  Systemic Diam: 2.20 cm MV A velocity: 70.80 cm/s MV E/A ratio:  0.99 William Kaiser MD Electronically signed by William Kaiser MD Signature Date/Time: 03/06/2022/3:13:50 PM    Final    VAS [place] LOWER EXTREMITY VENOUS (DVT)  Result Date: 03/06/2022  Lower Venous DVT Study Patient Name:  SAVYON LOKEN  Date of Exam:   03/06/2022 Medical Rec #: [id number]            Accession #:    [id number] Date of Birth: 1945-09-21             Patient Gender: M Patient Age:   73 years Exam Location:  [facility] Procedure:      VAS [place] LOWER EXTREMITY VENOUS (DVT) Referring Phys: Cornelius Moras XU --------------------------------------------------------------------------------  Indications: Stroke.  Comparison Study: Prior negative left LEV done 12/07/21 and 11/19/21 Performing Technologist: Sharion Dove RVS  Examination Guidelines: A complete evaluation includes B-mode imaging, spectral Doppler, color Doppler, and power Doppler as needed of all accessible portions of each vessel. Bilateral testing is considered an integral part of a complete examination. Limited examinations for reoccurring indications may be performed as noted. The reflux portion of the exam is performed with the patient in reverse Trendelenburg.  +---------+---------------+---------+-----------+----------+--------------+ RIGHT    CompressibilityPhasicitySpontaneityPropertiesThrombus Aging +---------+---------------+---------+-----------+----------+--------------+ CFV      Full           Yes      Yes                                  +---------+---------------+---------+-----------+----------+--------------+ SFJ      Full                                                        +---------+---------------+---------+-----------+----------+--------------+  FV Prox  Full                                                        +---------+---------------+---------+-----------+----------+--------------+ FV Mid   Full                                                        +---------+---------------+---------+-----------+----------+--------------+ FV DistalFull                                                        +---------+---------------+---------+-----------+----------+--------------+ PFV      Full                                                        +---------+---------------+---------+-----------+----------+--------------+ POP      Full           Yes      Yes                                 +---------+---------------+---------+-----------+----------+--------------+ PTV      Full                                                        +---------+---------------+---------+-----------+----------+--------------+ PERO     Full                                                        +---------+---------------+---------+-----------+----------+--------------+   +---------+---------------+---------+-----------+----------+--------------+ LEFT     CompressibilityPhasicitySpontaneityPropertiesThrombus Aging +---------+---------------+---------+-----------+----------+--------------+ CFV      Full           Yes      Yes                                 +---------+---------------+---------+-----------+----------+--------------+ SFJ      Full                                                        +---------+---------------+---------+-----------+----------+--------------+ FV Prox  Full                                                         +---------+---------------+---------+-----------+----------+--------------+  FV Mid   Full                                                        +---------+---------------+---------+-----------+----------+--------------+ FV DistalFull                                                        +---------+---------------+---------+-----------+----------+--------------+ PFV      Full                                                        +---------+---------------+---------+-----------+----------+--------------+ POP      Full           Yes      Yes                                 +---------+---------------+---------+-----------+----------+--------------+ PTV      Full                                                        +---------+---------------+---------+-----------+----------+--------------+ PERO     Full                                                        +---------+---------------+---------+-----------+----------+--------------+     Summary: BILATERAL: - No evidence of deep vein thrombosis seen in the lower extremities, bilaterally. -No evidence of popliteal cyst, bilaterally.   *See table(s) above for measurements and observations. Electronically signed by Deitra Mayo MD on 03/06/2022 at 2:05:58 PM.    Final    MR BRAIN WO CONTRAST  Result Date: 03/06/2022 CLINICAL DATA:  Initial evaluation for neuro deficit, stroke suspected. EXAM: MRI HEAD WITHOUT CONTRAST TECHNIQUE: Multiplanar, multiecho pulse sequences of the brain and surrounding structures were obtained without intravenous contrast. COMPARISON:  Prior CT from earlier the same day. FINDINGS: Brain: Cerebral volume within normal limits. Patchy T2/FLAIR hyperintensity involving the periventricular deep white matter both cerebral hemispheres, most consistent with chronic small vessel ischemic disease, moderate in nature. Confluent restricted diffusion involving the right occipital lobe, consistent with an  acute to early subacute right PCA territory infarct. Associated mild petechial blood products without frank hemorrhagic transformation. No significant regional mass effect. No other evidence for acute or subacute ischemia. Gray-white matter differentiation otherwise maintained. No other acute or chronic intracranial blood products. No mass lesion, midline shift or mass effect. No hydrocephalus or extra-axial fluid collection. Pituitary gland and suprasellar region within normal limits. Vascular: Major intracranial vascular flow voids are maintained. Skull and upper cervical spine: Craniocervical junction normal. Bone marrow signal intensity within normal limits. No scalp soft tissue abnormality. Sinuses/Orbits:  Globes and orbital soft tissues demonstrate no acute finding. Paranasal sinuses are largely clear. No significant mastoid effusion. Other: 1.8 cm T2 hypointense lesion noted within the posterior aspect of the right parotid gland, indeterminate. IMPRESSION: 1. Acute to early subacute right PCA territory infarct. Associated mild petechial blood products without frank hemorrhagic transformation or significant regional mass effect. 2. Underlying moderate chronic microvascular ischemic disease. 3. 1.8 cm T2 hypointense lesion within the posterior aspect of the right parotid gland, indeterminate. Nonemergent outpatient ENT referral for further workup and evaluation suggested. Electronically Signed   By: Jeannine Boga M.D.   On: 03/06/2022 00:23   CT ANGIO HEAD NECK W WO CM  Result Date: 03/05/2022 CLINICAL DATA:  Diplopia EXAM: CT ANGIOGRAPHY HEAD AND NECK TECHNIQUE: Multidetector CT imaging of the head and neck was performed using the standard protocol during bolus administration of intravenous contrast. Multiplanar CT image reconstructions and MIPs were obtained to evaluate the vascular anatomy. Carotid stenosis measurements (when applicable) are obtained utilizing NASCET criteria, using the distal  internal carotid diameter as the denominator. RADIATION DOSE REDUCTION: This exam was performed according to the departmental dose-optimization program which includes automated exposure control, adjustment of the mA and/or kV according to patient size and/or use of iterative reconstruction technique. CONTRAST:  1m OMNIPAQUE IOHEXOL 350 MG/ML SOLN COMPARISON:  CT head 12/07/2021. FINDINGS: CT HEAD FINDINGS Brain: Acute or early subacute infarct in the right occipital lobe (PCA territory). Mild sulcal effacement is region without significant mass effect. No midline shift. No evidence of acute hemorrhage, mass lesion, or hydrocephalus. Vascular: See below. Skull: No acute fracture. Sinuses/Orbits: Clear sinuses.  No acute orbital findings. Other: No mastoid effusions. Review of the MIP images confirms the above findings CTA NECK FINDINGS Aortic arch: Great vessel origins are patent. Right carotid system: Patent. Calcific atherosclerosis at the carotid bifurcation without significant stenosis. Left carotid system: Patent.  No significant stenosis. Vertebral arteries: Left dominant. Both vertebral arteries are patent without significant stenosis. Skeleton: Multilevel degenerative change. Other neck: Approximately 1.6 cm partially calcified lesion in the posterior aspect of the right parotid gland with multiple surrounding smaller lesions. Upper chest: Limited motion limited assessment without consolidation. Review of the MIP images confirms the above findings CTA HEAD FINDINGS Anterior circulation: Bilateral intracranial ICAs, MCAs, and ACAs are patent without proximal high-grade stenosis. Posterior circulation: Bilateral intradural vertebral arteries, and basilar artery are patent without significant stenosis. Proximally, the post rib arteries are patent. Suspected high-grade stenosis versus occlusion of the distal (mid to distal P3) right PCA (for example see series 15, image 22). Venous sinuses: As permitted by  contrast timing, patent. Review of the MIP images confirms the above findings IMPRESSION: 1. Acute or early subacute infarct in the right occipital lobe (PCA territory). 2. Suspected high-grade stenosis versus occlusion of the distal (mid to distal P3) right PCA (for example see series 15, image 22), in the region of infarct described above. 3. Approximately 1.6 cm partially calcified lesion in the posterior aspect of the right parotid gland with multiple surrounding smaller lesions. These could potentially represent primary parotid neoplasms (either benign or malignant) or enlarged lymph nodes. Recommend outpatient ENT follow-up for management. Findings discussed with provider [place] via telephone at 7:15 p.m. Electronically Signed   By: FMargaretha SheffieldM.D.   On: 03/05/2022 19:16     Assessment and Plan:   New Cardiomyopathy Patient was admitted with stroke and was found to have newly reduced EF. Echo showed  LVEF of 30-35% with global  hypokinesis and grade 2 diastolic dysfunction, normal RV, mild aortic stenosis/ insufficiency, mild mitral regurgitation, mild to moderate pulmonic regurgitation, and a small pericardial effusion. EF down from 55-60% in 11/2021. - Patient reports dyspnea on exertion for the last couple of months. No other CHF symptoms. Suspect some of dyspnea is due to deconditioning after Whipple procedure in 07/2021. - Appears euvolemic on exam. He does state he developed a productive cough yesterday and has some scattered rhonchi on exam though so will order a chest x-ray.  - Recommended adding GDMT but patient request that we not add any additional cardiac medications at this time. He states he was not on any medications prior to his Whipple procedure in 07/2021 and is now on several. Patient would like to think about adding GDMT. If he is agreeable, I would probably start with low dose Losartan, Toprol-XL, and SGLT2 inhibitor. - Continue to monitor volume status closely. - Etiology  of cardiomyopathy unclear. 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 However, cannot rule out ischemic cardiomyopathy. He had non-obstructive CAD noted on cardiac catheterization in 2017 and could of had progression of disease. Would let patient recover from stroke and can then discuss additional ischemic evaluation as an outpatient.  Non-Obstructive CAD Noted on cardiac catheterization in 2017. - No chest pain. - Currently on DAPT given acute stroke. - Not on a statin. Patient does not want to start one at this time.  Valvular Disease Echo this admission showed mild mild aortic stenosis/ insufficiency, mild mitral regurgitation, and mild to moderate pulmonic regurgitation. - Can continue to monitor as an outpatient.  Hyperlipidemia Lipid panel this admission: Total Cholesterol 119, Triglycerides 75, HDL 38, LDL 66. LDL goal < 55 under new guidelines. - Discussed started a statin but patient declined.  Acute Stroke Patient presented with headache and vision changes. CTA and brain MRI showed acute to subacute right PCA stroke. Per Neurology, etiology concerning for cardioembolic source. - No evidence of atrial fibrillation on telemetry so far. - Continue DAPT per Neurology. Plan is for Aspirin and Plavix for 21 days and then Aspirin alone. - Patient declined starting a statin. - Neurology has recommended loop recorder to rule out atrial fibrillation. Will notify EP team.   Otherwise, per primary team: - Type 2 diabetes mellitus - Multiple myeloma - AL amyloidosis - Parotid gland lesion  Risk Assessment/Risk Scores:    New York Heart Association (NYHA) Functional Class NYHA Class III   For questions or updates, please contact [place] Please consult [email or web address] for contact info under    Signed, Darreld Mclean, PA-C  03/07/2022 7:45 AM

## 2022-03-07 NOTE — Progress Notes (Signed)
STROKE TEAM PROGRESS NOTE   ATTENDING NOTE: I reviewed above note and agree with the assessment and plan. Pt was seen and examined.   76 year old male with history of diabetes, hypertension, hyperlipidemia, multiple myeloma, prostate cancer admitted for left hemianopia.  CT showed right PCA infarct.  CTA head and neck right PCA distal P3 occlusion, left VA origin stenosis.  MRI confirmed right PCA infarct.  EF 30 to 35%, LE venous Doppler no DVT.  LDL 66, A1c pending.  K3.3, creatinine 0.89  On exam, patient severe hard of hearing, not have hearing aid with him at this time.  No significant hemianopia but seems to have left visual field decreased acuity.  Right abduction impaired, chronic.  Otherwise neuro intact.  Etiology for patient stroke concerning for cardioembolic source.  EF 30 to 35% down from 55 to 60% in 11/2021, likely either from cardiomyopathy or occult A-fib.  Recommend cardiology consult for this acute decline of cardiac function.  If no AC needed, will also recommend loop recorder to rule out A-fib.  Currently on aspirin 325 plan 75 DAPT for 3 months and then aspirin alone..  No need of statin at this time given LDL at goal.  PT/OT recommend CIR.  Will follow  For detailed assessment and plan, please refer to above/below as I have made changes wherever appropriate.   Rosalin Hawking, MD PhD Stroke Neurology 03/07/2022 2:05 PM    INTERVAL HISTORY Patient was evaluated in ED hallway. Has notable R CN VI palsy and bilaterally impaired finger-nose-finger test, but otherwise no focal neurologic deficits. Patient is hard of hearing and wears hearing aids.  Vitals:   03/07/22 0600 03/07/22 0728 03/07/22 1120 03/07/22 1225  BP:  (!) 121/59 128/64   Pulse:  81 81   Resp:  20    Temp:  98.9 F (37.2 C) 98.7 F (37.1 C)   TempSrc:  Oral Oral   SpO2:  93% 95% 100%  Weight: 63.6 kg     Height:       CBC:  Recent Labs  Lab 03/05/22 1632 03/05/22 1725 03/07/22 0723  WBC 7.0  --   7.0  NEUTROABS 5.2  --   --   HGB 10.4* 11.9* 9.3*  HCT 34.1* 35.0* 29.7*  MCV 78.4*  --  76.5*  PLT 419*  --  754   Basic Metabolic Panel:  Recent Labs  Lab 03/06/22 0615 03/07/22 0723  NA 138 137  K 3.3* 3.4*  CL 108 107  CO2 22 22  GLUCOSE 63* 184*  BUN 10 10  CREATININE 0.89 0.98  CALCIUM 8.5* 8.5*  MG  --  1.7   Lipid Panel:  Recent Labs  Lab 03/05/22 2356  CHOL 119  TRIG 75  HDL 38*  CHOLHDL 3.1  VLDL 15  LDLCALC 66   HgbA1c: No results for input(s): "HGBA1C" in the last 168 hours. Urine Drug Screen:  Recent Labs  Lab 03/05/22 2356  LABOPIA POSITIVE*  COCAINSCRNUR NONE DETECTED  LABBENZ NONE DETECTED  AMPHETMU NONE DETECTED  THCU NONE DETECTED  LABBARB NONE DETECTED    Alcohol Level  Recent Labs  Lab 03/05/22 2356  ETH <10    IMAGING past 24 hours DG CHEST PORT 1 VIEW  Result Date: 03/07/2022 CLINICAL DATA:  220523 Productive cough 492010 10026 Shortness of breath 10026 EXAM: PORTABLE CHEST 1 VIEW COMPARISON:  September 03, 2021 FINDINGS: The cardiomediastinal silhouette is unchanged in contour. Small LEFT pleural effusion. Trace RIGHT pleural effusion. No pneumothorax. Increased  diffuse interstitial prominence, peribronchial cuffing and perihilar vascular prominence. Mildly increased bibasilar reticular nodularity. IMPRESSION: Constellation of findings are favored to reflect pulmonary edema with small LEFT and trace RIGHT pleural effusions. Differential considerations include atypical pulmonary infection Electronically Signed   By: Valentino Saxon M.D.   On: 03/07/2022 10:43   ECHOCARDIOGRAM COMPLETE  Result Date: 03/06/2022    ECHOCARDIOGRAM REPORT   Patient Name:   SQUIRE WITHEY Date of Exam: 03/06/2022 Medical Rec #:  [id number]           Height:       65.0 in Accession #:    [id number]          Weight:       140.0 lb Date of Birth:  1945-04-05            BSA:          1.700 m Patient Age:    41 years            BP:           129/73 mmHg  Patient Gender: M                   HR:           78 bpm. Exam Location:  Inpatient Procedure: 2D Echo, Cardiac Doppler, Color Doppler and Strain Analysis Indications:    Stroke I63.9  History:        Patient has prior history of Echocardiogram examinations, most                 recent 12/01/2021. Stroke, Arrythmias:PVC,                 Signs/Symptoms:Dyspnea; Risk Factors:Hypertension, Dyslipidemia                 and Diabetes.  Sonographer:    Ronny Flurry Referring Phys: ERIC CHEN IMPRESSIONS  1. Left ventricular ejection fraction, by estimation, is 30 to 35%. Left ventricular ejection fraction by 3D volume is 37 %. The left ventricle has moderately decreased function. The left ventricle demonstrates global hypokinesis. The left ventricular internal cavity size was mildly dilated. There is mild left ventricular hypertrophy. Left ventricular diastolic parameters are consistent with Grade II diastolic dysfunction (pseudonormalization).  2. Right ventricular systolic function is normal. The right ventricular size is normal. Tricuspid regurgitation signal is inadequate for assessing PA pressure.  3. Left atrial size was mildly dilated.  4. A small pericardial effusion is present. The pericardial effusion is circumferential.  5. The mitral valve is grossly normal. Mild mitral valve regurgitation. No evidence of mitral stenosis.  6. The aortic valve is abnormal. There is moderate calcification of the aortic valve. Aortic valve regurgitation is mild. Aortic valve sclerosis/calcification is present. Mild aortic valve stenosis noted on prior echocardiogram. Reduced gradient on today's exam may be secondary to low flow state, with stroke volume index of 32 mL/m2.  7. The inferior vena cava is dilated in size with >50% respiratory variability, suggesting right atrial pressure of 8 mmHg. FINDINGS  Left Ventricle: Left ventricular ejection fraction, by estimation, is 30 to 35%. Left ventricular ejection fraction by 3D  volume is 37 %. The left ventricle has moderately decreased function. The left ventricle demonstrates global hypokinesis. Global longitudinal strain performed but not reported based on interpreter judgement due to suboptimal tracking. The left ventricular internal cavity size was mildly dilated. There is mild left ventricular hypertrophy. Left ventricular diastolic parameters are consistent with Grade II  diastolic dysfunction (pseudonormalization). Right Ventricle: The right ventricular size is normal. No increase in right ventricular wall thickness. Right ventricular systolic function is normal. Tricuspid regurgitation signal is inadequate for assessing PA pressure. Left Atrium: Left atrial size was mildly dilated. Right Atrium: Right atrial size was normal in size. Pericardium: A small pericardial effusion is present. The pericardial effusion is circumferential. Mitral Valve: The mitral valve is grossly normal. Mild mitral valve regurgitation. No evidence of mitral valve stenosis. Tricuspid Valve: The tricuspid valve is normal in structure. Tricuspid valve regurgitation is trivial. No evidence of tricuspid stenosis. Aortic Valve: The aortic valve is abnormal. There is moderate calcification of the aortic valve. Aortic valve regurgitation is mild. Aortic regurgitation PHT measures 476 msec. Aortic valve sclerosis/calcification is present, without any evidence of aortic stenosis. Aortic valve mean gradient measures 8.5 mmHg. Aortic valve peak gradient measures 15.3 mmHg. Aortic valve area, by VTI measures 1.53 cm. Pulmonic Valve: The pulmonic valve was normal in structure. Pulmonic valve regurgitation is mild to moderate based on spectral Doppler profile. No evidence of pulmonic stenosis. Aorta: The aortic root is normal in size and structure. Venous: The inferior vena cava is dilated in size with greater than 50% respiratory variability, suggesting right atrial pressure of 8 mmHg. IAS/Shunts: No atrial level shunt  detected by color flow Doppler.  LEFT VENTRICLE PLAX 2D LVIDd:         6.00 cm         Diastology LVIDs:         4.90 cm         LV e' medial:    3.53 cm/s LV PW:         1.20 cm         LV E/e' medial:  19.8 LV IVS:        0.90 cm         LV e' lateral:   4.35 cm/s LVOT diam:     2.20 cm         LV E/e' lateral: 16.1 LV SV:         55 LV SV Index:   32 LVOT Area:     3.80 cm        3D Volume EF                                LV 3D EF:    Left                                             ventricul                                             ar                                             ejection  fraction                                             by 3D                                             volume is                                             37 %.                                 3D Volume EF:                                3D EF:        37 %                                LV EDV:       182 ml                                LV ESV:       115 ml                                LV SV:        67 ml RIGHT VENTRICLE             IVC RV S prime:     12.90 cm/s  IVC diam: 2.30 cm TAPSE (M-mode): 1.9 cm LEFT ATRIUM           Index        RIGHT ATRIUM           Index LA diam:      4.10 cm 2.41 cm/m   RA Area:     14.65 cm LA Vol (A2C): 44.2 ml 26.00 ml/m  RA Volume:   34.55 ml  20.32 ml/m LA Vol (A4C): 55.3 ml 32.53 ml/m  AORTIC VALVE AV Area (Vmax):    1.59 cm AV Area (Vmean):   1.49 cm AV Area (VTI):     1.53 cm AV Vmax:           195.50 cm/s AV Vmean:          133.000 cm/s AV VTI:            0.357 m AV Peak Grad:      15.3 mmHg AV Mean Grad:      8.5 mmHg LVOT Vmax:         81.90 cm/s LVOT Vmean:        52.100 cm/s LVOT VTI:          0.144 m LVOT/AV VTI ratio: 0.40 AI PHT:            476 msec  AORTA Ao Root diam:  3.20 cm Ao Asc diam:  3.40 cm MITRAL VALVE MV Area (PHT): 3.48 cm    SHUNTS MV Decel Time: 218 msec    Systemic VTI:  0.14 m MV E velocity: 70.00 cm/s   Systemic Diam: 2.20 cm MV A velocity: 70.80 cm/s MV E/A ratio:  0.99 Cherlynn Kaiser MD Electronically signed by Cherlynn Kaiser MD Signature Date/Time: 03/06/2022/3:13:50 PM    Final     PHYSICAL EXAM General: well-appearing and in no acute distress HEENT: normocephalic and atraumatic Respiratory: normal respiratory effort and on RA Gastrointestinal: non-tender and non-distended Extremities: moving all extremities spontaneously  Mental Status: William Barron is alert; he is oriented to person, place, time, and situation. Speech was clear and fluent without evidence of aphasia. He was able to follow 3 step commands without difficulty.  Cranial Nerves: II:  Visual fields grossly intact III,IV, VI: no ptosis, extra-ocular motions  with notable R CN VI palsy , chronic V,VII: smile symmetric, facial light touch sensation intact bilaterally VIII: hearing grossly diminished XII: midline tongue extension without atrophy and without fasciculations  Motor: Right : Upper extremity   5/5 full power  Lower extremity   5/5 full power  Left: Upper extremity   5/5 full power Lower extremity   5/5 full power  Tone and bulk: normal tone throughout; no atrophy noted  Sensory: sensation to light touch intact throughout bilaterally  Cerebellar: Finger-to-nose test impaired  Gait: not observed during encounter  ASSESSMENT/PLAN 76 year old male history of amyloidosis, multiple myeloma, history of prostate cancer, history of pancreatic cancer status post Whipple, dyslipidemia, diabetes insulin requiring presents to the ER today with right-sided headache and blurred vision.  Last known well was yesterday evening 03/04/2022.  Patient states he woke up around 6:00 this morning.  He states that he had difficulty seeing out of his right eye.  He went to see an ophthalmologist who referred him to the ER. CT scan of the head in the ER demonstrated a acute/subacute right occipital stroke.  Per the ER  doctor, patient has a right cranial 6th nerve palsy.  Stroke: Acute R PCA stroke, embolic pattern likely from occult afib vs. Cardiomyopathy with low EF CT showed right PCA infarct.   CTA head and neck right PCA distal P3 occlusion, left VA origin stenosis.   MRI confirmed right PCA infarct.   Echo EF 30 to 35% LE venous Doppler no DVT.   Recommend loop recorder prior to discharge. LDL 66 HgbA1c pending VTE prophylaxis - SCDs No antithrombotic prior to admission, now on aspirin 325 mg and clopidogrel 75 mg DAPT daily for 3 months given intracranial stenosis/occlusion, then aspirin 81 mg alone daily indefinitely Therapy recommendations:  CIR Disposition:  pending   Diabetes type II Uncontrolled Home meds:  insulin HgbA1c pending, goal < 7.0 CBGs SSI  Hyperlipidemia Home meds none LDL 66, goal less than 70 No statin needed given LDL goal and advanced age  Other Stroke Risk Factors Advanced Age >/= 67  Other Active Problems per primary team Multiple myeloma Prostate cancer  Hospital day # 1  Rosalin Hawking, MD PhD Stroke Neurology 03/07/2022 2:11 PM   To contact Stroke Continuity provider, please refer to [email or web address] After hours, contact General Neurology

## 2022-03-07 NOTE — Progress Notes (Signed)
[facility]                                                                              William Barron, is a 76 y.o. male, DOB - [birth year], LPF:[id number] Admit date - 03/05/2022    Outpatient Primary MD for the patient is William Hatchet, MD  LOS - 1  days  Chief Complaint  Patient presents with   Headache       Brief summary   Patient is a 76 year old male with history of pancreatic CA status post Whipple, prostate CA, multiple myeloma, amyloidosis, dyslipidemia, DM, presented to ED with right-sided headache and blurred vision.  Last known well was in the evening of 03/04/2022.  He woke up on the morning of admission on 12/22 with difficulty seeing out of his right eye.  He went to see an ophthalmologist and was referred to ER CT head showed acute/subacute right occipital CVA Failed swallow evaluation Neurology was consulted, admitted for further workup..   Assessment & Plan    Principal Problem: Acute right PCA stroke ([place]) -Presented with visual issues, having difficulty seeing half of the clock.  CT head showed right PCA territory stroke. -MRI brain showed acute to early subacute right PCA territory infarct.  Associated mild petechial blood products without frank hemorrhagic transformation or significant residual mass effect.  Underlying moderate chronic microvascular ischemic disease -CTA head and neck showed acute or early subacute infarct in the right occipital lobe, suspected high-grade stenosis versus occlusion of the distal right PCA. -2D echo: EF of 35 to 40% with global hypokinesis, mild AS, mild pulmonary regurg, mild MR, mild AR (EF 50 to 55% on echo in 11/2021) -Continue aspirin 81 mg daily, Plavix 75 mg daily for for 21 days, then aspirin 81 mg daily indefinitely -Lipid panel showed LDL 66, not on statin PTA -Hemoglobin A1c 7.4 on 11/13/2021, repeat A1c pending -PT OT evaluation recommended CIR  Active Problems: New onset systolic CHF, EF 35  to 53% -2D echo showed EF of 35 to 40% with global hypokinesis (was 50 to 55% on echo in 11/2021) -Coughing and somewhat short of breath, wheezing R>L since last night, obtain BNP, procalcitonin, chest x-ray. -Continue DuoNebs -CXR with pulmonary edema with small left and trace right pleural effusions -Appreciate cardiology recommendations, Lasix 20 mg IV x 1, added Coreg, ARB tomorrow  Hypokalemia -K 3.4, replaced  GERD -Continue PPI  Diabetes mellitus type 2, IDDM -CBGs elevated, follow hemoglobin A1c Recent Labs    03/05/22 1905 03/06/22 1213 03/06/22 1712 03/06/22 2131 03/07/22 0625 03/07/22 1122  GLUCAP 75 101* 199* 201* 216* 175*  Increased Semglee to 8 units daily, added NovoLog meal coverage 3 units 3 times daily AC, continue sliding scale insulin sensitive  History of pancreatic CA status post Whipple procedure -Continue Creon 72,000 units 3 times daily with meals -Follows with [name]  History of AL amyloidosis ([place]),  Multiple myeloma ([place]) -Outpatient follow-up with his oncologist, [name] at [facility].  History of prostate CA -Continue outpatient follow-up with urology, Dr. Jeffie Pollock, completed XRT on 06/02/2020    Lesion of parotid gland -MRI brain incidentally also  showed 1.8 cm T2 hypointense lesion within the posterior aspect of the right parotid gland, indeterminate.  Recommended nonemergent outpatient ENT evaluation -CTA showed 1.6 cm partially calcified lesion in the posterior aspect of right parotid gland with multiple surrounding smaller lesion, could potentially represent primary parotid neoplasm benign or malignant or enlarged lymph nodes, recommended outpatient ENT evaluation  Code Status: Full code DVT Prophylaxis:  SCD's Start: 03/05/22 2235  Level of Care: Level of care: Telemetry Medical Family Communication: Updated patient's wife, Taris Galindo on phone on 12/24 today   Disposition Plan:      Remains inpatient appropriate: PT  recommended inpatient rehab  Procedures:  MRI brain, CTA head and neck 2D echo  Consultants:   Neurology Antimicrobials: None     Medications  aspirin EC  325 mg Oral Daily   carvedilol  3.125 mg Oral BID WC   clopidogrel  75 mg Oral Daily   furosemide  20 mg Intravenous Once   insulin aspart  0-9 Units Subcutaneous TID WC   insulin aspart  3 Units Subcutaneous TID WC   insulin glargine-yfgn  8 Units Subcutaneous Daily   ipratropium-albuterol  3 mL Nebulization Q6H   lipase/protease/amylase  72,000 Units Oral TID WC   mirabegron ER  25 mg Oral Daily   pantoprazole (PROTONIX) IV  40 mg Intravenous Q24H      Subjective:   William Barron was seen and examined today.  Coughing, productive +, somewhat SOB since last night, wheezing. + Visual issues, no focal weakness   Objective:   Vitals:   03/07/22 0600 03/07/22 0728 03/07/22 1120 03/07/22 1225  BP:  (!) 121/59 128/64   Pulse:  81 81   Resp:  20    Temp:  98.9 F (37.2 C) 98.7 F (37.1 C)   TempSrc:  Oral Oral   SpO2:  93% 95% 100%  Weight: 63.6 kg     Height:        Intake/Output Summary (Last 24 hours) at 03/07/2022 1237 Last data filed at 03/07/2022 1125 Gross per 24 hour  Intake 320 ml  Output 1300 ml  Net -980 ml     Wt Readings from Last 3 Encounters:  03/07/22 63.6 kg  01/28/22 66.3 kg  01/14/22 67.2 kg    Physical Exam General: Alert and oriented x 3, NAD, blurred vision Cardiovascular: S1 S2 clear, RRR.  Respiratory: Bibasilar crackles with wheezing R>L  Gastrointestinal: Soft, nontender, nondistended, NBS Ext: no pedal edema bilaterally Neuro: RLE 4/5 strength 5/5 left side and RUE Psych: Normal affect     Data Reviewed:  I have personally reviewed following labs    CBC Lab Results  Component Value Date   WBC 7.0 03/07/2022   RBC 3.88 (L) 03/07/2022   HGB 9.3 (L) 03/07/2022   HCT 29.7 (L) 03/07/2022   MCV 76.5 (L) 03/07/2022   MCH 24.0 (L) 03/07/2022   PLT 334 03/07/2022    MCHC 31.3 03/07/2022   RDW 17.9 (H) 03/07/2022   LYMPHSABS 1.1 03/05/2022   MONOABS 0.5 03/05/2022   EOSABS 0.1 03/05/2022   BASOSABS 0.1 99/35/7017     Last metabolic panel Lab Results  Component Value Date   NA 137 03/07/2022   K 3.4 (L) 03/07/2022   CL 107 03/07/2022   CO2 22 03/07/2022   BUN 10 03/07/2022   CREATININE 0.98 03/07/2022   GLUCOSE 184 (H) 03/07/2022   GFRNONAA >60 03/07/2022   GFRAA >60 07/13/2018   CALCIUM 8.5 (L) 03/07/2022  PHOS 3.1 09/10/2021   PROT 5.6 (L) 03/06/2022   ALBUMIN 2.7 (L) 03/06/2022   LABGLOB 2.7 06/21/2018   AGRATIO 1.3 06/21/2018   BILITOT 0.5 03/06/2022   ALKPHOS 564 (H) 03/06/2022   AST 28 03/06/2022   ALT 26 03/06/2022   ANIONGAP 8 03/07/2022    CBG (last 3)  Recent Labs    03/06/22 2131 03/07/22 0625 03/07/22 1122  GLUCAP 201* 216* 175*      Coagulation Profile: Recent Labs  Lab 03/05/22 1632  INR 1.2     Radiology Studies: I have personally reviewed the imaging studies  DG CHEST PORT 1 VIEW  Result Date: 03/07/2022 CLINICAL DATA:  220523 Productive cough 580998 10026 Shortness of breath 10026 EXAM: PORTABLE CHEST 1 VIEW COMPARISON:  September 03, 2021 FINDINGS: The cardiomediastinal silhouette is unchanged in contour. Small LEFT pleural effusion. Trace RIGHT pleural effusion. No pneumothorax. Increased diffuse interstitial prominence, peribronchial cuffing and perihilar vascular prominence. Mildly increased bibasilar reticular nodularity. IMPRESSION: Constellation of findings are favored to reflect pulmonary edema with small LEFT and trace RIGHT pleural effusions. Differential considerations include atypical pulmonary infection Electronically Signed   By: Valentino Saxon M.D.   On: 03/07/2022 10:43   ECHOCARDIOGRAM COMPLETE  Result Date: 03/06/2022    ECHOCARDIOGRAM REPORT   Patient Name:   NYAN DUFRESNE Date of Exam: 03/06/2022 Medical Rec #:  [id number]           Height:       65.0 in Accession #:     [id number]          Weight:       140.0 lb Date of Birth:  1946-01-27            BSA:          1.700 m Patient Age:    78 years            BP:           129/73 mmHg Patient Gender: M                   HR:           78 bpm. Exam Location:  Inpatient Procedure: 2D Echo, Cardiac Doppler, Color Doppler and Strain Analysis Indications:    Stroke I63.9  History:        Patient has prior history of Echocardiogram examinations, most                 recent 12/01/2021. Stroke, Arrythmias:PVC,                 Signs/Symptoms:Dyspnea; Risk Factors:Hypertension, Dyslipidemia                 and Diabetes.  Sonographer:    Ronny Flurry Referring Phys: ERIC CHEN IMPRESSIONS  1. Left ventricular ejection fraction, by estimation, is 30 to 35%. Left ventricular ejection fraction by 3D volume is 37 %. The left ventricle has moderately decreased function. The left ventricle demonstrates global hypokinesis. The left ventricular internal cavity size was mildly dilated. There is mild left ventricular hypertrophy. Left ventricular diastolic parameters are consistent with Grade II diastolic dysfunction (pseudonormalization).  2. Right ventricular systolic function is normal. The right ventricular size is normal. Tricuspid regurgitation signal is inadequate for assessing PA pressure.  3. Left atrial size was mildly dilated.  4. A small pericardial effusion is present. The pericardial effusion is circumferential.  5. The mitral valve is grossly normal. Mild mitral valve regurgitation. No  evidence of mitral stenosis.  6. The aortic valve is abnormal. There is moderate calcification of the aortic valve. Aortic valve regurgitation is mild. Aortic valve sclerosis/calcification is present. Mild aortic valve stenosis noted on prior echocardiogram. Reduced gradient on today's exam may be secondary to low flow state, with stroke volume index of 32 mL/m2.  7. The inferior vena cava is dilated in size with >50% respiratory variability, suggesting right  atrial pressure of 8 mmHg. FINDINGS  Left Ventricle: Left ventricular ejection fraction, by estimation, is 30 to 35%. Left ventricular ejection fraction by 3D volume is 37 %. The left ventricle has moderately decreased function. The left ventricle demonstrates global hypokinesis. Global longitudinal strain performed but not reported based on interpreter judgement due to suboptimal tracking. The left ventricular internal cavity size was mildly dilated. There is mild left ventricular hypertrophy. Left ventricular diastolic parameters are consistent with Grade II diastolic dysfunction (pseudonormalization). Right Ventricle: The right ventricular size is normal. No increase in right ventricular wall thickness. Right ventricular systolic function is normal. Tricuspid regurgitation signal is inadequate for assessing PA pressure. Left Atrium: Left atrial size was mildly dilated. Right Atrium: Right atrial size was normal in size. Pericardium: A small pericardial effusion is present. The pericardial effusion is circumferential. Mitral Valve: The mitral valve is grossly normal. Mild mitral valve regurgitation. No evidence of mitral valve stenosis. Tricuspid Valve: The tricuspid valve is normal in structure. Tricuspid valve regurgitation is trivial. No evidence of tricuspid stenosis. Aortic Valve: The aortic valve is abnormal. There is moderate calcification of the aortic valve. Aortic valve regurgitation is mild. Aortic regurgitation PHT measures 476 msec. Aortic valve sclerosis/calcification is present, without any evidence of aortic stenosis. Aortic valve mean gradient measures 8.5 mmHg. Aortic valve peak gradient measures 15.3 mmHg. Aortic valve area, by VTI measures 1.53 cm. Pulmonic Valve: The pulmonic valve was normal in structure. Pulmonic valve regurgitation is mild to moderate based on spectral Doppler profile. No evidence of pulmonic stenosis. Aorta: The aortic root is normal in size and structure. Venous: The  inferior vena cava is dilated in size with greater than 50% respiratory variability, suggesting right atrial pressure of 8 mmHg. IAS/Shunts: No atrial level shunt detected by color flow Doppler.  LEFT VENTRICLE PLAX 2D LVIDd:         6.00 cm         Diastology LVIDs:         4.90 cm         LV e' medial:    3.53 cm/s LV PW:         1.20 cm         LV E/e' medial:  19.8 LV IVS:        0.90 cm         LV e' lateral:   4.35 cm/s LVOT diam:     2.20 cm         LV E/e' lateral: 16.1 LV SV:         55 LV SV Index:   32 LVOT Area:     3.80 cm        3D Volume EF                                LV 3D EF:    Left  ventricul                                             ar                                             ejection                                             fraction                                             by 3D                                             volume is                                             37 %.                                 3D Volume EF:                                3D EF:        37 %                                LV EDV:       182 ml                                LV ESV:       115 ml                                LV SV:        67 ml RIGHT VENTRICLE             IVC RV S prime:     12.90 cm/s  IVC diam: 2.30 cm TAPSE (M-mode): 1.9 cm LEFT ATRIUM           Index        RIGHT ATRIUM           Index LA diam:      4.10 cm 2.41 cm/m   RA Area:     14.65 cm LA Vol (A2C): 44.2 ml 26.00 ml/m  RA Volume:   34.55 ml  20.32 ml/m LA Vol (A4C): 55.3 ml 32.53 ml/m  AORTIC VALVE AV Area (Vmax):    1.59 cm AV Area (Vmean):  1.49 cm AV Area (VTI):     1.53 cm AV Vmax:           195.50 cm/s AV Vmean:          133.000 cm/s AV VTI:            0.357 m AV Peak Grad:      15.3 mmHg AV Mean Grad:      8.5 mmHg LVOT Vmax:         81.90 cm/s LVOT Vmean:        52.100 cm/s LVOT VTI:          0.144 m LVOT/AV VTI ratio: 0.40 AI PHT:            476 msec  AORTA Ao  Root diam: 3.20 cm Ao Asc diam:  3.40 cm MITRAL VALVE MV Area (PHT): 3.48 cm    SHUNTS MV Decel Time: 218 msec    Systemic VTI:  0.14 m MV E velocity: 70.00 cm/s  Systemic Diam: 2.20 cm MV A velocity: 70.80 cm/s MV E/A ratio:  0.99 Cherlynn Kaiser MD Electronically signed by Cherlynn Kaiser MD Signature Date/Time: 03/06/2022/3:13:50 PM    Final    VAS [place] LOWER EXTREMITY VENOUS (DVT)  Result Date: 03/06/2022  Lower Venous DVT Study Patient Name:  TEDD COTTRILL  Date of Exam:   03/06/2022 Medical Rec #: [id number]            Accession #:    [id number] Date of Birth: 1945/10/22             Patient Gender: M Patient Age:   11 years Exam Location:  [facility] Procedure:      VAS [place] LOWER EXTREMITY VENOUS (DVT) Referring Phys: Cornelius Moras XU --------------------------------------------------------------------------------  Indications: Stroke.  Comparison Study: Prior negative left LEV done 12/07/21 and 11/19/21 Performing Technologist: Sharion Dove RVS  Examination Guidelines: A complete evaluation includes B-mode imaging, spectral Doppler, color Doppler, and power Doppler as needed of all accessible portions of each vessel. Bilateral testing is considered an integral part of a complete examination. Limited examinations for reoccurring indications may be performed as noted. The reflux portion of the exam is performed with the patient in reverse Trendelenburg.  +---------+---------------+---------+-----------+----------+--------------+ RIGHT    CompressibilityPhasicitySpontaneityPropertiesThrombus Aging +---------+---------------+---------+-----------+----------+--------------+ CFV      Full           Yes      Yes                                 +---------+---------------+---------+-----------+----------+--------------+ SFJ      Full                                                        +---------+---------------+---------+-----------+----------+--------------+ FV Prox  Full                                                         +---------+---------------+---------+-----------+----------+--------------+ FV Mid   Full                                                        +---------+---------------+---------+-----------+----------+--------------+  FV DistalFull                                                        +---------+---------------+---------+-----------+----------+--------------+ PFV      Full                                                        +---------+---------------+---------+-----------+----------+--------------+ POP      Full           Yes      Yes                                 +---------+---------------+---------+-----------+----------+--------------+ PTV      Full                                                        +---------+---------------+---------+-----------+----------+--------------+ PERO     Full                                                        +---------+---------------+---------+-----------+----------+--------------+   +---------+---------------+---------+-----------+----------+--------------+ LEFT     CompressibilityPhasicitySpontaneityPropertiesThrombus Aging +---------+---------------+---------+-----------+----------+--------------+ CFV      Full           Yes      Yes                                 +---------+---------------+---------+-----------+----------+--------------+ SFJ      Full                                                        +---------+---------------+---------+-----------+----------+--------------+ FV Prox  Full                                                        +---------+---------------+---------+-----------+----------+--------------+ FV Mid   Full                                                        +---------+---------------+---------+-----------+----------+--------------+ FV DistalFull                                                         +---------+---------------+---------+-----------+----------+--------------+  PFV      Full                                                        +---------+---------------+---------+-----------+----------+--------------+ POP      Full           Yes      Yes                                 +---------+---------------+---------+-----------+----------+--------------+ PTV      Full                                                        +---------+---------------+---------+-----------+----------+--------------+ PERO     Full                                                        +---------+---------------+---------+-----------+----------+--------------+     Summary: BILATERAL: - No evidence of deep vein thrombosis seen in the lower extremities, bilaterally. -No evidence of popliteal cyst, bilaterally.   *See table(s) above for measurements and observations. Electronically signed by Deitra Mayo MD on 03/06/2022 at 2:05:58 PM.    Final    MR BRAIN WO CONTRAST  Result Date: 03/06/2022 CLINICAL DATA:  Initial evaluation for neuro deficit, stroke suspected. EXAM: MRI HEAD WITHOUT CONTRAST TECHNIQUE: Multiplanar, multiecho pulse sequences of the brain and surrounding structures were obtained without intravenous contrast. COMPARISON:  Prior CT from earlier the same day. FINDINGS: Brain: Cerebral volume within normal limits. Patchy T2/FLAIR hyperintensity involving the periventricular deep white matter both cerebral hemispheres, most consistent with chronic small vessel ischemic disease, moderate in nature. Confluent restricted diffusion involving the right occipital lobe, consistent with an acute to early subacute right PCA territory infarct. Associated mild petechial blood products without frank hemorrhagic transformation. No significant regional mass effect. No other evidence for acute or subacute ischemia. Gray-white matter differentiation otherwise maintained. No other acute or  chronic intracranial blood products. No mass lesion, midline shift or mass effect. No hydrocephalus or extra-axial fluid collection. Pituitary gland and suprasellar region within normal limits. Vascular: Major intracranial vascular flow voids are maintained. Skull and upper cervical spine: Craniocervical junction normal. Bone marrow signal intensity within normal limits. No scalp soft tissue abnormality. Sinuses/Orbits: Globes and orbital soft tissues demonstrate no acute finding. Paranasal sinuses are largely clear. No significant mastoid effusion. Other: 1.8 cm T2 hypointense lesion noted within the posterior aspect of the right parotid gland, indeterminate. IMPRESSION: 1. Acute to early subacute right PCA territory infarct. Associated mild petechial blood products without frank hemorrhagic transformation or significant regional mass effect. 2. Underlying moderate chronic microvascular ischemic disease. 3. 1.8 cm T2 hypointense lesion within the posterior aspect of the right parotid gland, indeterminate. Nonemergent outpatient ENT referral for further workup and evaluation suggested. Electronically Signed   By: Jeannine Boga M.D.   On: 03/06/2022 00:23   CT ANGIO HEAD NECK W WO CM  Result Date: 03/05/2022  CLINICAL DATA:  [place] EXAM: CT ANGIOGRAPHY HEAD AND NECK TECHNIQUE: Multidetector CT imaging of the head and neck was performed using the standard protocol during bolus administration of intravenous contrast. Multiplanar CT image reconstructions and MIPs were obtained to evaluate the vascular anatomy. Carotid stenosis measurements (when applicable) are obtained utilizing NASCET criteria, using the distal internal carotid diameter as the denominator. RADIATION DOSE REDUCTION: This exam was performed according to the departmental dose-optimization program which includes automated exposure control, adjustment of the mA and/or kV according to patient size and/or use of iterative reconstruction  technique. CONTRAST:  83m OMNIPAQUE IOHEXOL 350 MG/ML SOLN COMPARISON:  CT head 12/07/2021. FINDINGS: CT HEAD FINDINGS Brain: Acute or early subacute infarct in the right occipital lobe (PCA territory). Mild sulcal effacement is region without significant mass effect. No midline shift. No evidence of acute hemorrhage, mass lesion, or hydrocephalus. Vascular: See below. Skull: No acute fracture. Sinuses/Orbits: Clear sinuses.  No acute orbital findings. Other: No mastoid effusions. Review of the MIP images confirms the above findings CTA NECK FINDINGS Aortic arch: Great vessel origins are patent. Right carotid system: Patent. Calcific atherosclerosis at the carotid bifurcation without significant stenosis. Left carotid system: Patent.  No significant stenosis. Vertebral arteries: Left dominant. Both vertebral arteries are patent without significant stenosis. Skeleton: Multilevel degenerative change. Other neck: Approximately 1.6 cm partially calcified lesion in the posterior aspect of the right parotid gland with multiple surrounding smaller lesions. Upper chest: Limited motion limited assessment without consolidation. Review of the MIP images confirms the above findings CTA HEAD FINDINGS Anterior circulation: Bilateral intracranial ICAs, MCAs, and ACAs are patent without proximal high-grade stenosis. Posterior circulation: Bilateral intradural vertebral arteries, and basilar artery are patent without significant stenosis. Proximally, the post rib arteries are patent. Suspected high-grade stenosis versus occlusion of the distal (mid to distal P3) right PCA (for example see series 15, image 22). Venous sinuses: As permitted by contrast timing, patent. Review of the MIP images confirms the above findings IMPRESSION: 1. Acute or early subacute infarct in the right occipital lobe (PCA territory). 2. Suspected high-grade stenosis versus occlusion of the distal (mid to distal P3) right PCA (for example see series 15,  image 22), in the region of infarct described above. 3. Approximately 1.6 cm partially calcified lesion in the posterior aspect of the right parotid gland with multiple surrounding smaller lesions. These could potentially represent primary parotid neoplasms (either benign or malignant) or enlarged lymph nodes. Recommend outpatient ENT follow-up for management. Findings discussed with provider [place] via telephone at 7:15 p.m. Electronically Signed   By: FMargaretha SheffieldM.D.   On: 03/05/2022 19:16       Basilia Stuckert M.D. [facility] 03/07/2022, 12:37 PM  Available via Epic secure chat 7am-7pm After 7 pm, please refer to night coverage provider listed on amion.

## 2022-03-07 NOTE — NC FL2 (Signed)
[place] LEVEL OF CARE FORM     IDENTIFICATION  Patient Name: William Barron Birthdate: 1946/02/18 Sex: male Admission Date (Current Location): 03/05/2022  [facility] and [place] Number:  Herbalist and Address:  [facility]. [facility], [place] [address], [place], [place] [address]      Provider Number: [id number]  Attending Physician Name and Address:  Mendel Corning, MD  Relative Name and Phone Number:       Current Level of Care: Hospital Recommended Level of Care: [place] Prior Approval Number:    Date Approved/Denied:   PASRR Number: [id number]  Discharge Plan: SNF    Current Diagnoses: Patient Active Problem List   Diagnosis Date Noted   Occipital stroke ([place]) 03/05/2022   Leg swelling 01/20/2022   Nonrheumatic aortic valve stenosis 01/20/2022   Abnormal CT of the abdomen    Pressure injury of skin 09/05/2021   Goals of care, counseling/discussion 09/03/2021   Postoperative anemia due to acute blood loss 08/31/2021   Reactive thrombocytosis 08/31/2021   Malnutrition of moderate degree 08/19/2021   Debility 08/19/2021   Asymptomatic bacteriuria 08/15/2021   H/O Whipple procedure 08/13/2021   Chronic hiccups 08/13/2021   Candida esophagitis ([place]) 08/13/2021   Insulin-requiring or dependent type II diabetes mellitus ([place]) 08/13/2021   Anemia 08/13/2021   Hypoalbuminemia 08/13/2021   Frequent PVCs 08/13/2021   Prolonged QT interval 08/13/2021   Facial droop 08/13/2021   Hormone replacement therapy 08/13/2021   Lesion of parotid gland 08/13/2021   Protein-calorie malnutrition, severe ([place]) 08/13/2021   Feeding difficulties 08/12/2021   Posterior tibial tendon dysfunction, right 04/15/2021   Posterior tibial tendinitis, right leg    Prostate cancer ([place]) 02/11/2020   Orthostatic tremor 10/01/2019   Dyslipidemia 09/12/2019   Nonrheumatic pulmonary valve insufficiency 09/12/2019   Red blood cell  antibody positive, compatible PRBC difficult to obtain 02/15/2018   Diplopia 01/03/2018   Benign essential tremor 01/03/2018   Pain in joint of right ankle 05/17/2017   Foot pain 05/17/2017   Peroneal tendinitis 05/17/2017   Depression with anxiety 01/03/2017   Insomnia 05/05/2016   Coronary artery calcification seen on CAT scan    DOE (dyspnea on exertion)    Fatigue 02/27/2015   Elevated homocysteine 09/26/2014   Multiple myeloma ([place]) 09/26/2014   Acquired pes planus of right foot 07/02/2014   Posterior tibial tendon dysfunction 07/02/2014   Encounter for antineoplastic chemotherapy 01/02/2014   Kahler disease ([place]) 01/02/2014   Avitaminosis D 01/02/2014   Amyloidosis ([place]) 03/26/2013   Enlarged lymph node 02/14/2013   Pure hypercholesterolemia 03/29/2011   Memory disorder 09/21/2010   Labile hypertension 09/21/2010    Orientation RESPIRATION BLADDER Height & Weight     Self, Time, Situation, Place  Normal Continent Weight: 140 lb 3.4 oz (63.6 kg) Height:  _0  (165.1 cm)  BEHAVIORAL SYMPTOMS/MOOD NEUROLOGICAL BOWEL NUTRITION STATUS      Continent    AMBULATORY STATUS COMMUNICATION OF NEEDS Skin   Limited Assist Verbally Normal                       Personal Care Assistance Level of Assistance  Bathing, Feeding, Dressing Bathing Assistance: Limited assistance Feeding assistance: Limited assistance Dressing Assistance: Limited assistance     Functional Limitations Info  Sight, Hearing, Speech Sight Info: Adequate Hearing Info: Adequate Speech Info: Adequate    SPECIAL CARE FACTORS FREQUENCY  PT (By licensed PT), OT (By licensed OT)  Contractures Contractures Info: Not present    Additional Factors Info                  Current Medications (03/07/2022):  This is the current hospital active medication list Current Facility-Administered Medications  Medication Dose Route Frequency Provider Last Rate Last Admin    acetaminophen (TYLENOL) tablet 650 mg  650 mg Oral Q4H PRN Kristopher Oppenheim, DO   650 mg at 03/07/22 1002   Or   acetaminophen (TYLENOL) 160 MG/5ML solution 650 mg  650 mg Per Tube Q4H PRN Kristopher Oppenheim, DO       Or   acetaminophen (TYLENOL) suppository 650 mg  650 mg Rectal Q4H PRN Kristopher Oppenheim, DO       aspirin EC tablet 325 mg  325 mg Oral Daily Rosalin Hawking, MD   325 mg at 03/07/22 1002   carvedilol (COREG) tablet 3.125 mg  3.125 mg Oral BID WC Geralynn Rile, MD       clopidogrel (PLAVIX) tablet 75 mg  75 mg Oral Daily Rai, Ripudeep K, MD   75 mg at 03/07/22 1002   furosemide (LASIX) injection 20 mg  20 mg Intravenous Once Sande Rives E, PA-C       insulin aspart (novoLOG) injection 0-9 Units  0-9 Units Subcutaneous TID WC Rai, Ripudeep K, MD   2 Units at 03/07/22 1210   insulin aspart (novoLOG) injection 3 Units  3 Units Subcutaneous TID WC Rai, Ripudeep K, MD   3 Units at 03/07/22 1207   insulin glargine-yfgn (SEMGLEE) injection 8 Units  8 Units Subcutaneous Daily Rai, Ripudeep K, MD   8 Units at 03/07/22 1207   ipratropium-albuterol (DUONEB) 0.5-2.5 (3) MG/3ML nebulizer solution 3 mL  3 mL Nebulization Q4H PRN Rai, Ripudeep K, MD       lipase/protease/amylase (CREON) capsule 72,000 Units  72,000 Units Oral TID WC Rai, Ripudeep K, MD   36,000 Units at 03/07/22 1205   mirabegron ER (MYRBETRIQ) tablet 25 mg  25 mg Oral Daily Rai, Ripudeep K, MD   25 mg at 03/07/22 1002   pantoprazole (PROTONIX) injection 40 mg  40 mg Intravenous Q24H Kristopher Oppenheim, DO   40 mg at 03/06/22 2144   potassium chloride SA (KLOR-CON M) CR tablet 40 mEq  40 mEq Oral Once Rai, Vernelle Emerald, MD         Discharge Medications: Please see discharge summary for a list of discharge medications.  Relevant Imaging Results:  Relevant Lab Results:   Additional Information SS# [id number]  Amador Cunas, [facility]

## 2022-03-07 NOTE — Progress Notes (Signed)
Spoke to pt's wife [name] re PT recommendation for CIR. Pt's wife reports pt with previous CIR stay which was positive but she is not sure if she will be able to bring pt home post rehab and is considering SNF. Pt with previous 87 day stay at [facility] and wife prefers this facility again. Pt's wife plans to use VA benefits (pt is 100% service connected) instead of Medicare. Reached out to Whitney with [name] who is familiar with pt/wife and agreed to review referral on Tuesday after Christmas holiday. SW will provide updates as available.   Wandra Feinstein, MSW, LCSW [phone] (coverage)

## 2022-03-07 NOTE — Progress Notes (Signed)
Occupational Therapy Treatment Patient Details Name: William Barron MRN: [id number] DOB: [birth year] Today's Date: 03/07/2022   History of present illness 76 year old male admitted 12/22  with right-sided headache and blurred vision +Acute right PCA stroke. PMHX:  amyloidosis, multiple myeloma, history of prostate cancer, history of pancreatic cancer status post Whipple, dyslipidemia, diabetes.   OT comments  Pt demonstrates decreased sitting balance with cognitive challenges and no UE support. Pt at baseline indep with all adls. Pt currently with occluded R len to glasses for monocular vision. Pt reports HA and RN in room to provide morning medication. Recommendation remains AIR.    Recommendations for follow up therapy are one component of a multi-disciplinary discharge planning process, led by the attending physician.  Recommendations may be updated based on patient status, additional functional criteria and insurance authorization.    Follow Up Recommendations  Acute inpatient rehab (3hours/day)     Assistance Recommended at Discharge Frequent or constant Supervision/Assistance  Patient can return home with the following  A lot of help with walking and/or transfers;A little help with bathing/dressing/bathroom;Assistance with cooking/housework;Direct supervision/assist for financial management;Direct supervision/assist for medications management;Assist for transportation;Help with stairs or ramp for entrance;Assistance with feeding   Equipment Recommendations  Other (comment)    Recommendations for Other Services Rehab consult    Precautions / Restrictions Precautions Precautions: Fall Precaution Comments: vision impaired       Mobility Bed Mobility Overal bed mobility: Needs Assistance Bed Mobility: Supine to Sit, Sit to Supine     Supine to sit: Min assist Sit to supine: Supervision   General bed mobility comments: pt with R lateral lean and R leg crossing L LE. pt  needed cues to adjust bil LE. pt challenged with cognitive visual task sitting with increased balance deficits    Transfers Overall transfer level: Needs assistance                 General transfer comment: pt lateral scooting along bed surface supervision from foot of bed to hob. pt offered sit<>Stand but elected to scoot     Balance Overall balance assessment: Needs assistance Sitting-balance support: No upper extremity supported, Feet supported Sitting balance-Leahy Scale: Poor   Postural control: Left lateral lean                                 ADL either performed or assessed with clinical judgement   ADL Overall ADL's : Needs assistance/impaired Eating/Feeding: Set up;Bed level                                          Extremity/Trunk Assessment              Vision   Vision Assessment?: Yes Additional Comments: pt with R eye lense tape reduced during session. pt monocular vision in all fields. pt using vision to scan menu read and order lunch in static sitting this session.   Perception Perception Perception: Impaired   Praxis      Cognition Arousal/Alertness: Awake/alert Behavior During Therapy: WFL for tasks assessed/performed Overall Cognitive Status: Impaired/Different from baseline Area of Impairment: Problem solving                   Current Attention Level: Sustained Memory: Decreased short-term memory     Awareness: Emergent Problem Solving: Slow processing General  Comments: pt able to order lunch as part of therapy this session using the phone without errors        Exercises Exercises: Other exercises Other Exercises Other Exercises: visual scanning exercises using menu    Shoulder Instructions       General Comments pt unaware of time and states "i thought my wife be here." pt informed time and then states "oh its still early then"    Pertinent Vitals/ Pain       Pain Assessment Pain  Assessment: 0-10 Pain Score: 10-Worst pain ever Pain Location: head Pain Descriptors / Indicators: Headache Pain Intervention(s): Monitored during session, Repositioned  Home Living                                          Prior Functioning/Environment              Frequency  Min 2X/week        Progress Toward Goals  OT Goals(current goals can now be found in the care plan section)  Progress towards OT goals: Progressing toward goals  Acute Rehab OT Goals Patient Stated Goal: to get some apple cobbler OT Goal Formulation: With patient Time For Goal Achievement: 03/20/22 Potential to Achieve Goals: Good ADL Goals Pt Will Perform Grooming: standing;with supervision Pt Will Perform Lower Body Dressing: sit to/from stand;with modified independence Pt Will Transfer to Toilet: with supervision;ambulating;regular height toilet Pt/caregiver will Perform Home Exercise Program: Right Upper extremity;Left upper extremity;Increased strength;With written HEP provided;With Supervision Additional ADL Goal #1: Pt will locate all grooming items needed during ADL. Additional ADL Goal #2: Pt will verbalize HEP for double vision.  Plan Discharge plan remains appropriate    Co-evaluation                 AM-PAC OT "6 Clicks" Daily Activity     Outcome Measure   Help from another person eating meals?: A Little Help from another person taking care of personal grooming?: A Little Help from another person toileting, which includes using toliet, bedpan, or urinal?: A Little Help from another person bathing (including washing, rinsing, drying)?: A Little Help from another person to put on and taking off regular upper body clothing?: A Little Help from another person to put on and taking off regular lower body clothing?: A Little 6 Click Score: 18    End of Session    OT Visit Diagnosis: Unsteadiness on feet (R26.81);Muscle weakness (generalized) (M62.81);Other  abnormalities of gait and mobility (R26.89);History of falling (Z91.81);Other symptoms and signs involving cognitive function;Low vision, both eyes (H54.2);Dizziness and giddiness (R42)   Activity Tolerance Patient tolerated treatment well   Patient Left in bed;with call bell/phone within reach;with nursing/sitter in room;Other (comment) (RN arriving for medications, chest xray present, alarm not set as staff pending placing board for xray)   Nurse Communication Mobility status;Precautions        Time: 8938-1017 OT Time Calculation (min): 18 min  Charges: OT General Charges $OT Visit: 1 Visit OT Treatments $Self Care/Home Management : 8-22 mins   [place], OTR/L  Acute Rehabilitation Services Office: [phone] .   Jeri Modena 03/07/2022, 10:07 AM

## 2022-03-07 NOTE — Progress Notes (Shared)
Inpatient Rehab Admissions Coordinator:    I spoke with Pt. And wife over the phone to discuss potential CIR admit. Wife is not sure she can bring him home and provide 24/7 support (this was an issue with past Cir admission as well). Pt. Had a good experience with [facility] in the past and she's considering [facility] instead. I will folllow up with her Tuesday for a decision.   Clemens Catholic, [place], [place] Admissions Coordinator  [phone] ([place]) [phone] (office)

## 2022-03-08 ENCOUNTER — Inpatient Hospital Stay (HOSPITAL_COMMUNITY): Payer: No Typology Code available for payment source

## 2022-03-08 DIAGNOSIS — E8589 Other amyloidosis: Secondary | ICD-10-CM | POA: Diagnosis not present

## 2022-03-08 DIAGNOSIS — I5021 Acute systolic (congestive) heart failure: Secondary | ICD-10-CM | POA: Diagnosis present

## 2022-03-08 DIAGNOSIS — J09X1 Influenza due to identified novel influenza A virus with pneumonia: Secondary | ICD-10-CM | POA: Diagnosis not present

## 2022-03-08 DIAGNOSIS — I639 Cerebral infarction, unspecified: Secondary | ICD-10-CM | POA: Diagnosis not present

## 2022-03-08 DIAGNOSIS — E119 Type 2 diabetes mellitus without complications: Secondary | ICD-10-CM | POA: Diagnosis not present

## 2022-03-08 LAB — RESPIRATORY PANEL BY PCR

## 2022-03-08 LAB — GLUCOSE, CAPILLARY
Glucose-Capillary: 173 mg/dL — ABNORMAL HIGH (ref 70–99)
Glucose-Capillary: 198 mg/dL — ABNORMAL HIGH (ref 70–99)
Glucose-Capillary: 120 mg/dL — ABNORMAL HIGH (ref 70–99)
Glucose-Capillary: 131 mg/dL — ABNORMAL HIGH (ref 70–99)

## 2022-03-08 LAB — BASIC METABOLIC PANEL
Anion gap: 12 (ref 5–15)
BUN: 14 mg/dL (ref 8–23)
CO2: 23 mmol/L (ref 22–32)
Calcium: 8.9 mg/dL (ref 8.9–10.3)
Chloride: 101 mmol/L (ref 98–111)
Creatinine, Ser: 1.1 mg/dL (ref 0.61–1.24)
GFR, Estimated: >60 mL/min (ref >60)
Glucose, Bld: 143 mg/dL — ABNORMAL HIGH (ref 70–99)
Potassium: 3.8 mmol/L (ref 3.5–5.1)
Sodium: 136 mmol/L (ref 135–145)

## 2022-03-08 MED ORDER — PANTOPRAZOLE SODIUM 40 MG PO TBEC
40.0000 mg | DELAYED_RELEASE_TABLET | Freq: Every day | ORAL | Status: DC
  Administered 2022-03-08 – 2022-03-15 (×8): 40 mg via ORAL
  Filled 2022-03-08 (×8): qty 1

## 2022-03-08 NOTE — Progress Notes (Addendum)
STROKE TEAM PROGRESS NOTE   INTERVAL HISTORY Patient was evaluated in room today Has notable patch on R eye for his R CN VI palsy. Today he endorses subjective complaint of generalized weakness, but no focal neurologic deficits. Patient is hard of hearing and wears hearing aids.  Vitals:   03/07/22 2341 03/08/22 0325 03/08/22 0500 03/08/22 0728  BP: 128/80 137/63  129/67  Pulse: 91 94  100  Resp: _0 Temp: (!) 100.7 F (38.2 C) 100.2 F (37.9 C)  99.2 F (37.3 C)  TempSrc: Oral Oral  Oral  SpO2: 98% 99%  100%  Weight:   64.7 kg   Height:       CBC:  Recent Labs  Lab 03/05/22 1632 03/05/22 1725 03/07/22 0723  WBC 7.0  --  7.0  NEUTROABS 5.2  --   --   HGB 10.4* 11.9* 9.3*  HCT 34.1* 35.0* 29.7*  MCV 78.4*  --  76.5*  PLT 419*  --  664   Basic Metabolic Panel:  Recent Labs  Lab 03/07/22 0723 03/08/22 0303  NA 137 136  K 3.4* 3.8  CL 107 101  CO2 22 23  GLUCOSE 184* 143*  BUN 10 14  CREATININE 0.98 1.10  CALCIUM 8.5* 8.9  MG 1.7  --    Lipid Panel:  Recent Labs  Lab 03/05/22 2356  CHOL 119  TRIG 75  HDL 38*  CHOLHDL 3.1  VLDL 15  LDLCALC 66   HgbA1c: No results for input(s): "HGBA1C" in the last 168 hours. Urine Drug Screen:  Recent Labs  Lab 03/05/22 2356  LABOPIA POSITIVE*  COCAINSCRNUR NONE DETECTED  LABBENZ NONE DETECTED  AMPHETMU NONE DETECTED  THCU NONE DETECTED  LABBARB NONE DETECTED    Alcohol Level  Recent Labs  Lab 03/05/22 2356  ETH <10    IMAGING past 24 hours No results found.  PHYSICAL EXAM General: well-appearing and in no acute distress HEENT: normocephalic and atraumatic Respiratory: normal respiratory effort and on RA Gastrointestinal: non-tender and non-distended Extremities: moving all extremities spontaneously  Mental Status: William Barron is alert; he is oriented to person, place, time, and situation. Speech was clear and fluent without evidence of aphasia. He was able to follow 3 step commands  without difficulty.  Cranial Nerves: II:  Visual fields grossly intact III,IV, VI: no ptosis, extra-ocular motions  with notable R CN VI palsy , chronic V,VII: smile symmetric, facial light touch sensation intact bilaterally VIII: hearing grossly diminished XII: midline tongue extension without atrophy and without fasciculations  Motor: Right : Upper extremity   5/5 full power  Lower extremity   5/5 full power  Left: Upper extremity   5/5 full power Lower extremity   5/5 full power  Tone and bulk: normal tone throughout; no atrophy noted  Sensory: sensation to light touch intact throughout bilaterally  Gait: not observed during encounter  ASSESSMENT/PLAN 76 year old male history of amyloidosis, multiple myeloma, history of prostate cancer, history of pancreatic cancer status post Whipple, dyslipidemia, diabetes insulin requiring presents to the ER today with right-sided headache and blurred vision.  Last known well was yesterday evening 03/04/2022.  Patient states he woke up around 6:00 this morning.  He states that he had difficulty seeing out of his right eye.  He went to see an ophthalmologist who referred him to the ER. CT scan of the head in the ER demonstrated a acute/subacute right occipital stroke.  Per the ER doctor, patient has a right  cranial 6th nerve palsy.  Stroke: Acute R PCA stroke, embolic pattern likely from occult afib vs. Cardiomyopathy with low EF CT showed right PCA infarct.   CTA head and neck right PCA distal P3 occlusion, left VA origin stenosis.   MRI confirmed right PCA infarct.   Echo EF 30 to 35% LE venous Doppler no DVT.   Recommend loop recorder prior to discharge. LDL 66 HgbA1c pending VTE prophylaxis - SCDs No antithrombotic prior to admission, now on aspirin 325 mg and clopidogrel 75 mg DAPT daily for 3 months given intracranial stenosis/occlusion, then aspirin 81 mg alone daily indefinitely Therapy recommendations:  CIR Disposition:  pending    Diabetes type II Uncontrolled Home meds:  insulin HgbA1c pending, goal < 7.0 CBGs SSI  Hyperlipidemia Home meds none LDL 66, goal less than 70 No statin needed given LDL goal and advanced age  Other Stroke Risk Factors Advanced Age >/= 44  Other Active Problems per primary team Multiple myeloma Prostate cancer Chronic right CN VI palsy, on eye patch.   Hospital day # 2     ATTENDING NOTE: I reviewed above note and agree with the assessment and plan. Pt was seen and examined.   Pt lethargic today and prefer eyes closed. However, he was able to answer all orientation questions and follows simple commands. Moving all extremities. Difficult to check visual field today but he does have eye patch on the right. Neuro stable. Pending loop recorder prior to discharge. On DAPT. Will follow.   For detailed assessment and plan, please refer to above/below as I have made changes wherever appropriate.   Rosalin Hawking, MD PhD Stroke Neurology 03/08/2022 4:29 PM    To contact Stroke Continuity provider, please refer to [email or web address] After hours, contact General Neurology

## 2022-03-08 NOTE — Progress Notes (Signed)
Progress Note  Patient Name: William Barron Date of Encounter: 03/08/2022  Primary Cardiologist: Minus Breeding, MD   Subjective   Patient seen and examined at his bedside. Bedside nurse in the room.  She reports the patient has had some vomiting this morning.  He was awake lying in bed when I arrived.  Inpatient Medications    Scheduled Meds:  aspirin EC  325 mg Oral Daily   carvedilol  3.125 mg Oral BID WC   Chlorhexidine Gluconate Cloth  6 each Topical Daily   clopidogrel  75 mg Oral Daily   insulin aspart  0-9 Units Subcutaneous TID WC   insulin aspart  3 Units Subcutaneous TID WC   insulin glargine-yfgn  8 Units Subcutaneous Daily   lipase/protease/amylase  72,000 Units Oral TID WC   mirabegron ER  25 mg Oral Daily   oseltamivir  30 mg Oral BID   pantoprazole (PROTONIX) IV  40 mg Intravenous Q24H   Continuous Infusions:  PRN Meds: acetaminophen **OR** acetaminophen (TYLENOL) oral liquid 160 mg/5 mL **OR** acetaminophen, ipratropium-albuterol   Vital Signs    Vitals:   03/07/22 2341 03/08/22 0325 03/08/22 0500 03/08/22 0728  BP: 128/80 137/63  129/67  Pulse: 91 94  100  Resp: _0 Temp: (!) 100.7 F (38.2 C) 100.2 F (37.9 C)  99.2 F (37.3 C)  TempSrc: Oral Oral  Oral  SpO2: 98% 99%  100%  Weight:   64.7 kg   Height:        Intake/Output Summary (Last 24 hours) at 03/08/2022 0918 Last data filed at 03/07/2022 2100 Gross per 24 hour  Intake --  Output 475 ml  Net -475 ml   Filed Weights   03/05/22 1619 03/07/22 0600 03/08/22 0500  Weight: 63.5 kg 63.6 kg 64.7 kg    Telemetry    Sinus tachycardia - Personally Reviewed  ECG    now - Personally Reviewed  Physical Exam    General: NAD Head: Atraumatic, normal size  Eyes: PEERLA, EOMI  Neck: Supple, normal JVD Cardiac: Normal S1, S2; RRR; no murmurs, rubs, or gallops Lungs: Clear to auscultation bilaterally Abd: Soft, nontender, no hepatomegaly  Ext: warm, no  edema Musculoskeletal: No deformities, BUE and BLE strength normal and equal Skin: Warm and dry, no rashes   Neuro: Alert and oriented to person, place, time, and situation, full neuro examined not performed  Psych: Normal mood and affect   Labs    Chemistry Recent Labs  Lab 03/05/22 1632 03/05/22 1725 03/06/22 0615 03/07/22 0723 03/08/22 0303  NA 139   < > 138 137 136  K 3.2*   < > 3.3* 3.4* 3.8  CL 106   < > 108 107 101  CO2 23  --  _1 GLUCOSE 128*   < > 63* 184* 143*  BUN 13   < > _2 CREATININE 0.98   < > 0.89 0.98 1.10  CALCIUM 9.1  --  8.5* 8.5* 8.9  PROT 7.1  --  5.6*  --   --   ALBUMIN 3.5  --  2.7*  --   --   AST 39  --  28  --   --   ALT 33  --  26  --   --   ALKPHOS 727*  --  564*  --   --   BILITOT 0.4  --  0.5  --   --   GFRNONAA >60  --  >  60 >60 >60  ANIONGAP 10  --  _0 < > = values in this interval not displayed.     Hematology Recent Labs  Lab 03/05/22 1632 03/05/22 1725 03/07/22 0723  WBC 7.0  --  7.0  RBC 4.35  --  3.88*  HGB 10.4* 11.9* 9.3*  HCT 34.1* 35.0* 29.7*  MCV 78.4*  --  76.5*  MCH 23.9*  --  24.0*  MCHC 30.5  --  31.3  RDW 18.2*  --  17.9*  PLT 419*  --  334    Cardiac EnzymesNo results for input(s): "TROPONINI" in the last 168 hours. No results for input(s): "TROPIPOC" in the last 168 hours.   BNP Recent Labs  Lab 03/07/22 1237  BNP 1,326.2*     DDimer No results for input(s): "DDIMER" in the last 168 hours.   Radiology    DG CHEST PORT 1 VIEW  Result Date: 03/07/2022 CLINICAL DATA:  220523 Productive cough [id number] 10026 Shortness of breath 10026 EXAM: PORTABLE CHEST 1 VIEW COMPARISON:  September 03, 2021 FINDINGS: The cardiomediastinal silhouette is unchanged in contour. Small LEFT pleural effusion. Trace RIGHT pleural effusion. No pneumothorax. Increased diffuse interstitial prominence, peribronchial cuffing and perihilar vascular prominence. Mildly increased bibasilar reticular nodularity. IMPRESSION:  Constellation of findings are favored to reflect pulmonary edema with small LEFT and trace RIGHT pleural effusions. Differential considerations include atypical pulmonary infection Electronically Signed   By: Valentino Saxon M.D.   On: 03/07/2022 10:43   ECHOCARDIOGRAM COMPLETE  Result Date: 03/06/2022    ECHOCARDIOGRAM REPORT   Patient Name:   William Barron Date of Exam: 03/06/2022 Medical Rec #:  [id number]           Height:       65.0 in Accession #:    [id number]          Weight:       140.0 lb Date of Birth:  [birth year]            BSA:          1.700 m Patient Age:    76 years            BP:           129/73 mmHg Patient Gender: M                   HR:           78 bpm. Exam Location:  Inpatient Procedure: 2D Echo, Cardiac Doppler, Color Doppler and Strain Analysis Indications:    Stroke I63.9  History:        Patient has prior history of Echocardiogram examinations, most                 recent 12/01/2021. Stroke, Arrythmias:PVC,                 Signs/Symptoms:Dyspnea; Risk Factors:Hypertension, Dyslipidemia                 and Diabetes.  Sonographer:    Ronny Flurry Referring Phys: ERIC CHEN IMPRESSIONS  1. Left ventricular ejection fraction, by estimation, is 30 to 35%. Left ventricular ejection fraction by 3D volume is 37 %. The left ventricle has moderately decreased function. The left ventricle demonstrates global hypokinesis. The left ventricular internal cavity size was mildly dilated. There is mild left ventricular hypertrophy. Left ventricular diastolic parameters are consistent with Grade II diastolic dysfunction (pseudonormalization).  2. Right ventricular systolic function is  normal. The right ventricular size is normal. Tricuspid regurgitation signal is inadequate for assessing PA pressure.  3. Left atrial size was mildly dilated.  4. A small pericardial effusion is present. The pericardial effusion is circumferential.  5. The mitral valve is grossly normal. Mild mitral valve  regurgitation. No evidence of mitral stenosis.  6. The aortic valve is abnormal. There is moderate calcification of the aortic valve. Aortic valve regurgitation is mild. Aortic valve sclerosis/calcification is present. Mild aortic valve stenosis noted on prior echocardiogram. Reduced gradient on today's exam may be secondary to low flow state, with stroke volume index of 32 mL/m2.  7. The inferior vena cava is dilated in size with >50% respiratory variability, suggesting right atrial pressure of 8 mmHg. FINDINGS  Left Ventricle: Left ventricular ejection fraction, by estimation, is 30 to 35%. Left ventricular ejection fraction by 3D volume is 37 %. The left ventricle has moderately decreased function. The left ventricle demonstrates global hypokinesis. Global longitudinal strain performed but not reported based on interpreter judgement due to suboptimal tracking. The left ventricular internal cavity size was mildly dilated. There is mild left ventricular hypertrophy. Left ventricular diastolic parameters are consistent with Grade II diastolic dysfunction (pseudonormalization). Right Ventricle: The right ventricular size is normal. No increase in right ventricular wall thickness. Right ventricular systolic function is normal. Tricuspid regurgitation signal is inadequate for assessing PA pressure. Left Atrium: Left atrial size was mildly dilated. Right Atrium: Right atrial size was normal in size. Pericardium: A small pericardial effusion is present. The pericardial effusion is circumferential. Mitral Valve: The mitral valve is grossly normal. Mild mitral valve regurgitation. No evidence of mitral valve stenosis. Tricuspid Valve: The tricuspid valve is normal in structure. Tricuspid valve regurgitation is trivial. No evidence of tricuspid stenosis. Aortic Valve: The aortic valve is abnormal. There is moderate calcification of the aortic valve. Aortic valve regurgitation is mild. Aortic regurgitation PHT measures 476  msec. Aortic valve sclerosis/calcification is present, without any evidence of aortic stenosis. Aortic valve mean gradient measures 8.5 mmHg. Aortic valve peak gradient measures 15.3 mmHg. Aortic valve area, by VTI measures 1.53 cm. Pulmonic Valve: The pulmonic valve was normal in structure. Pulmonic valve regurgitation is mild to moderate based on spectral Doppler profile. No evidence of pulmonic stenosis. Aorta: The aortic root is normal in size and structure. Venous: The inferior vena cava is dilated in size with greater than 50% respiratory variability, suggesting right atrial pressure of 8 mmHg. IAS/Shunts: No atrial level shunt detected by color flow Doppler.  LEFT VENTRICLE PLAX 2D LVIDd:         6.00 cm         Diastology LVIDs:         4.90 cm         LV e' medial:    3.53 cm/s LV PW:         1.20 cm         LV E/e' medial:  19.8 LV IVS:        0.90 cm         LV e' lateral:   4.35 cm/s LVOT diam:     2.20 cm         LV E/e' lateral: 16.1 LV SV:         55 LV SV Index:   32 LVOT Area:     3.80 cm        3D Volume EF  LV 3D EF:    Left                                             ventricul                                             ar                                             ejection                                             fraction                                             by 3D                                             volume is                                             37 %.                                 3D Volume EF:                                3D EF:        37 %                                LV EDV:       182 ml                                LV ESV:       115 ml                                LV SV:        67 ml RIGHT VENTRICLE             IVC RV S prime:     12.90 cm/s  IVC diam: 2.30 cm TAPSE (M-mode): 1.9 cm LEFT ATRIUM           Index        RIGHT ATRIUM           Index LA diam:      4.10  cm 2.41 cm/m   RA Area:     14.65 cm LA Vol (A2C): 44.2 ml  26.00 ml/m  RA Volume:   34.55 ml  20.32 ml/m LA Vol (A4C): 55.3 ml 32.53 ml/m  AORTIC VALVE AV Area (Vmax):    1.59 cm AV Area (Vmean):   1.49 cm AV Area (VTI):     1.53 cm AV Vmax:           195.50 cm/s AV Vmean:          133.000 cm/s AV VTI:            0.357 m AV Peak Grad:      15.3 mmHg AV Mean Grad:      8.5 mmHg LVOT Vmax:         81.90 cm/s LVOT Vmean:        52.100 cm/s LVOT VTI:          0.144 m LVOT/AV VTI ratio: 0.40 AI PHT:            476 msec  AORTA Ao Root diam: 3.20 cm Ao Asc diam:  3.40 cm MITRAL VALVE MV Area (PHT): 3.48 cm    SHUNTS MV Decel Time: 218 msec    Systemic VTI:  0.14 m MV E velocity: 70.00 cm/s  Systemic Diam: 2.20 cm MV A velocity: 70.80 cm/s MV E/A ratio:  0.99 Cherlynn Kaiser MD Electronically signed by Cherlynn Kaiser MD Signature Date/Time: 03/06/2022/3:13:50 PM    Final    VAS [place] LOWER EXTREMITY VENOUS (DVT)  Result Date: 03/06/2022  Lower Venous DVT Study Patient Name:  William Barron  Date of Exam:   03/06/2022 Medical Rec #: [id number]            Accession #:    [id number] Date of Birth: Mar 09, 1946             Patient Gender: M Patient Age:   49 years Exam Location:  [facility] Procedure:      VAS [place] LOWER EXTREMITY VENOUS (DVT) Referring Phys: Cornelius Moras XU --------------------------------------------------------------------------------  Indications: Stroke.  Comparison Study: Prior negative left LEV done 12/07/21 and 11/19/21 Performing Technologist: Sharion Dove RVS  Examination Guidelines: A complete evaluation includes B-mode imaging, spectral Doppler, color Doppler, and power Doppler as needed of all accessible portions of each vessel. Bilateral testing is considered an integral part of a complete examination. Limited examinations for reoccurring indications may be performed as noted. The reflux portion of the exam is performed with the patient in reverse Trendelenburg.  +---------+---------------+---------+-----------+----------+--------------+ RIGHT     CompressibilityPhasicitySpontaneityPropertiesThrombus Aging +---------+---------------+---------+-----------+----------+--------------+ CFV      Full           Yes      Yes                                 +---------+---------------+---------+-----------+----------+--------------+ SFJ      Full                                                        +---------+---------------+---------+-----------+----------+--------------+ FV Prox  Full                                                        +---------+---------------+---------+-----------+----------+--------------+  FV Mid   Full                                                        +---------+---------------+---------+-----------+----------+--------------+ FV DistalFull                                                        +---------+---------------+---------+-----------+----------+--------------+ PFV      Full                                                        +---------+---------------+---------+-----------+----------+--------------+ POP      Full           Yes      Yes                                 +---------+---------------+---------+-----------+----------+--------------+ PTV      Full                                                        +---------+---------------+---------+-----------+----------+--------------+ PERO     Full                                                        +---------+---------------+---------+-----------+----------+--------------+   +---------+---------------+---------+-----------+----------+--------------+ LEFT     CompressibilityPhasicitySpontaneityPropertiesThrombus Aging +---------+---------------+---------+-----------+----------+--------------+ CFV      Full           Yes      Yes                                 +---------+---------------+---------+-----------+----------+--------------+ SFJ      Full                                                         +---------+---------------+---------+-----------+----------+--------------+ FV Prox  Full                                                        +---------+---------------+---------+-----------+----------+--------------+ FV Mid   Full                                                        +---------+---------------+---------+-----------+----------+--------------+  FV DistalFull                                                        +---------+---------------+---------+-----------+----------+--------------+ PFV      Full                                                        +---------+---------------+---------+-----------+----------+--------------+ POP      Full           Yes      Yes                                 +---------+---------------+---------+-----------+----------+--------------+ PTV      Full                                                        +---------+---------------+---------+-----------+----------+--------------+ PERO     Full                                                        +---------+---------------+---------+-----------+----------+--------------+     Summary: BILATERAL: - No evidence of deep vein thrombosis seen in the lower extremities, bilaterally. -No evidence of popliteal cyst, bilaterally.   *See table(s) above for measurements and observations. Electronically signed by Deitra Mayo MD on 03/06/2022 at 2:05:58 PM.    Final     Cardiac Studies   Left Cardiac Catheterization 06/13/2015: Prox Cx lesion, 25% stenosed. Prox LAD lesion, 25% stenosed. Mid LAD lesion, 25% stenosed. Ost LM to LM lesion, 20% stenosed. The left ventricular systolic function is normal.   Mild to moderate scattered atherosclerosis. No hemodynamically significant lesions. Continue aggressive medical therapy. _______________   Echocardiogram 03/06/2022: Impressions: 1. Left ventricular ejection fraction, by estimation, is 30  to 35%. Left  ventricular ejection fraction by 3D volume is 37 %. The left ventricle has  moderately decreased function. The left ventricle demonstrates global  hypokinesis. The left ventricular  internal cavity size was mildly dilated. There is mild left ventricular  hypertrophy. Left ventricular diastolic parameters are consistent with  Grade II diastolic dysfunction (pseudonormalization).   2. Right ventricular systolic function is normal. The right ventricular  size is normal. Tricuspid regurgitation signal is inadequate for assessing  PA pressure.   3. Left atrial size was mildly dilated.   4. A small pericardial effusion is present. The pericardial effusion is  circumferential.   5. The mitral valve is grossly normal. Mild mitral valve regurgitation.  No evidence of mitral stenosis.   6. The aortic valve is abnormal. There is moderate calcification of the  aortic valve. Aortic valve regurgitation is mild. Aortic valve  sclerosis/calcification is present. Mild aortic valve stenosis noted on  prior echocardiogram. Reduced gradient on  today's exam may be secondary to low flow  state, with stroke volume index  of 32 mL/m2.   7. The inferior vena cava is dilated in size with >50% respiratory  variability, suggesting right atrial pressure of 8 mmHg.   Patient Profile     76 y.o. male mild non-obstructive CAD noted on cardiac catheterization in 05/2015, mild to moderate aortic stenosis/ insufficiency, moderate pulmonic regurgitation, chronic lower extremity edema, hyperlipidemia, type 2 diabetes mellitus, pancreatic cyst s/p [facility] in 07/2021 at [place], and multiple myeloma/ AL amyloidosis   Assessment & Plan    Acute accipital CVA  Dilated cardiomyopathy EF 30-35% New onset acute exacerbation of heart failure  Nononbstructuve CAD - LHC 2017  Previous discussion advising medical therapy in this acute phase with his recent stroke and then monitoring in the outpatient for need for  further ischemic evaluation.  I do agree with this. He does not appear to be clinically overloaded.  With the Lasix yesterday only put out 475 mL.  No need for dosing diuretics on a daily basis for now. In terms of his, medical therapy he is currently on carvedilol 3.125 twice a day, I will start losartan 25 mg daily which can be easily transition to [facility] in the outpatient setting if his blood pressure holds.  SGLT inhibitors can be added tomorrow. He has no anginal symptoms at this time, I agree with continuing his dual antiplatelet therapy with a half Asprin and Plavix in the setting of his acute CVA. Per note he is not on statin due to his reluctance to take more medications.   For questions or updates, please contact [place] Please consult [email or web address] for contact info under Cardiology/STEMI.      Signed, Berniece Salines, DO  03/08/2022, 9:18 AM

## 2022-03-08 NOTE — Progress Notes (Signed)
[facility]                                                                              William Barron, is a 76 y.o. male, DOB - Mar 11, 1946, TDV:[id number] Admit date - 03/05/2022    Outpatient Primary MD for the patient is [name], MD  LOS - 2  days  Chief Complaint  Patient presents with   Headache       Brief summary   Patient is a 76 year old male with history of pancreatic CA status post Whipple, prostate CA, multiple myeloma, amyloidosis, dyslipidemia, DM, presented to ED with right-sided headache and blurred vision.  Last known well was in the evening of 03/04/2022.  He woke up on the morning of admission on 12/22 with difficulty seeing out of his right eye.  He went to see an ophthalmologist and was referred to ER CT head showed acute/subacute right occipital CVA Failed swallow evaluation Neurology was consulted, admitted for further workup..   Assessment & Plan    Principal Problem: Acute right PCA stroke ([place]) -Presented with visual issues, having difficulty seeing half of the clock.  CT head showed right PCA territory stroke. -MRI brain showed acute to early subacute right PCA territory infarct.  Associated mild petechial blood products without frank hemorrhagic transformation or significant residual mass effect.  Underlying moderate chronic microvascular ischemic disease -CTA head and neck showed acute or early subacute infarct in the right occipital lobe, suspected high-grade stenosis versus occlusion of the distal right PCA. -2D echo: EF of 35 to 40% with global hypokinesis, mild AS, mild pulmonary regurg, mild MR, mild AR (EF 50 to 55% on echo in 11/2021) -Continue aspirin 81 mg daily, Plavix 75 mg daily for for 21 days, then aspirin 81 mg daily indefinitely -Lipid panel showed LDL 66, at goal, no need of statin per neurology -Hemoglobin A1c 7.4 on 11/13/2021, repeat A1c still pending -PT OT evaluation recommended CIR, however wife requested  SNF as it will be difficult to have care 24/7 after discharge from CIR  Active Problems: New onset systolic CHF, EF 35 to 06% -2D echo showed EF of 35 to 40% with global hypokinesis (was 50 to 55% on echo in 11/2021) -Feeling better today, received Lasix 20 mg IV x 1, found to have influenza A positive. -BNP 1326.2, received Lasix 20 mg IV x 1 on 12/24, will need IV Lasix, will defer to cardiology -Continue Coreg  Influenza A positive -COVID-19 negative, positive for influenza A, started on Tamiflu on 12/24  Hypokalemia -K 3.8  GERD -Continue PPI  Diabetes mellitus type 2, IDDM -HbA1c still pending, reordered again  Recent Labs    03/06/22 1712 03/06/22 2131 03/07/22 0625 03/07/22 1122 03/07/22 1627 03/08/22 0630  GLUCAP 199* 201* 216* 175* 240* 173*  -Will clarify Semglee dose this morning, currently on 8 units daily, NovoLog 3 units 3 times daily AC, SSI sensitive  History of pancreatic CA status post Whipple procedure -Continue Creon 72,000 units 3 times daily with meals -Follows with [name]  History of AL amyloidosis ([place]),  Multiple myeloma ([place]) -Outpatient follow-up with his oncologist, [name] at  [facility].  History of prostate CA -Continue outpatient follow-up with urology, Dr. Jeffie Pollock, completed XRT on 06/02/2020    Lesion of parotid gland -MRI brain incidentally also showed 1.8 cm T2 hypointense lesion within the posterior aspect of the right parotid gland, indeterminate.  Recommended nonemergent outpatient ENT evaluation -CTA showed 1.6 cm partially calcified lesion in the posterior aspect of right parotid gland with multiple surrounding smaller lesion, could potentially represent primary parotid neoplasm benign or malignant or enlarged lymph nodes, recommended outpatient ENT evaluation  Code Status: Full code DVT Prophylaxis:  SCD's Start: 03/05/22 2235  Level of Care: Level of care: Telemetry Medical Family Communication: Updated patient's  wife, [name] on 12/24  Disposition Plan:      Remains inpatient appropriate: PT recommended inpatient rehab, wife requested SNF  Procedures:  MRI brain, CTA head and neck 2D echo  Consultants:   Neurology Cardiology  Antimicrobials: Tamiflu started on 12/24 Medications  aspirin EC  325 mg Oral Daily   carvedilol  3.125 mg Oral BID WC   Chlorhexidine Gluconate Cloth  6 each Topical Daily   clopidogrel  75 mg Oral Daily   insulin aspart  0-9 Units Subcutaneous TID WC   insulin aspart  3 Units Subcutaneous TID WC   insulin glargine-yfgn  8 Units Subcutaneous Daily   lipase/protease/amylase  72,000 Units Oral TID WC   mirabegron ER  25 mg Oral Daily   oseltamivir  30 mg Oral BID   pantoprazole (PROTONIX) IV  40 mg Intravenous Q24H      Subjective:   William Barron was seen and examined today.  Overnight spiking fever 100.7 F.  No acute chest pain, shortness of breath,+ cough.  No focal weakness.  Objective:   Vitals:   03/07/22 2341 03/08/22 0325 03/08/22 0500 03/08/22 0728  BP: 128/80 137/63  129/67  Pulse: 91 94  100  Resp: _0 Temp: (!) 100.7 F (38.2 C) 100.2 F (37.9 C)  99.2 F (37.3 C)  TempSrc: Oral Oral  Oral  SpO2: 98% 99%  100%  Weight:   64.7 kg   Height:        Intake/Output Summary (Last 24 hours) at 03/08/2022 8338 Last data filed at 03/07/2022 2100 Gross per 24 hour  Intake --  Output 475 ml  Net -475 ml     Wt Readings from Last 3 Encounters:  03/08/22 64.7 kg  01/28/22 66.3 kg  01/14/22 67.2 kg   Physical Exam General: Alert and oriented x 3, NAD, hearing deficit Cardiovascular: S1 S2 clear, RRR.  Respiratory: Diminished breath sounds at the bases, wheezing improving Gastrointestinal: Soft, nontender, nondistended, NBS Ext: no pedal edema bilaterally Neuro: no new deficits Psych: Normal affect    Data Reviewed:  I have personally reviewed following labs    CBC Lab Results  Component Value Date   WBC  7.0 03/07/2022   RBC 3.88 (L) 03/07/2022   HGB 9.3 (L) 03/07/2022   HCT 29.7 (L) 03/07/2022   MCV 76.5 (L) 03/07/2022   MCH 24.0 (L) 03/07/2022   PLT 334 03/07/2022   MCHC 31.3 03/07/2022   RDW 17.9 (H) 03/07/2022   LYMPHSABS 1.1 03/05/2022   MONOABS 0.5 03/05/2022   EOSABS 0.1 03/05/2022   BASOSABS 0.1 25/07/3974     Last metabolic panel Lab Results  Component Value Date   NA 136 03/08/2022   K 3.8 03/08/2022   CL 101 03/08/2022   CO2 23 03/08/2022   BUN 14  03/08/2022   CREATININE 1.10 03/08/2022   GLUCOSE 143 (H) 03/08/2022   GFRNONAA >60 03/08/2022   GFRAA >60 07/13/2018   CALCIUM 8.9 03/08/2022   PHOS 3.1 09/10/2021   PROT 5.6 (L) 03/06/2022   ALBUMIN 2.7 (L) 03/06/2022   LABGLOB 2.7 06/21/2018   AGRATIO 1.3 06/21/2018   BILITOT 0.5 03/06/2022   ALKPHOS 564 (H) 03/06/2022   AST 28 03/06/2022   ALT 26 03/06/2022   ANIONGAP 12 03/08/2022    CBG (last 3)  Recent Labs    03/07/22 1122 03/07/22 1627 03/08/22 0630  GLUCAP 175* 240* 173*      Coagulation Profile: Recent Labs  Lab 03/05/22 1632  INR 1.2     Radiology Studies: I have personally reviewed the imaging studies  DG CHEST PORT 1 VIEW  Result Date: 03/07/2022 CLINICAL DATA:  220523 Productive cough 511021 10026 Shortness of breath 10026 EXAM: PORTABLE CHEST 1 VIEW COMPARISON:  September 03, 2021 FINDINGS: The cardiomediastinal silhouette is unchanged in contour. Small LEFT pleural effusion. Trace RIGHT pleural effusion. No pneumothorax. Increased diffuse interstitial prominence, peribronchial cuffing and perihilar vascular prominence. Mildly increased bibasilar reticular nodularity. IMPRESSION: Constellation of findings are favored to reflect pulmonary edema with small LEFT and trace RIGHT pleural effusions. Differential considerations include atypical pulmonary infection Electronically Signed   By: Valentino Saxon M.D.   On: 03/07/2022 10:43   ECHOCARDIOGRAM COMPLETE  Result Date: 03/06/2022     ECHOCARDIOGRAM REPORT   Patient Name:   DAMIEL BARTHOLD Date of Exam: 03/06/2022 Medical Rec #:  [id number]           Height:       65.0 in Accession #:    [id number]          Weight:       140.0 lb Date of Birth:  [birth year]            BSA:          1.700 m Patient Age:    6 years            BP:           129/73 mmHg Patient Gender: M                   HR:           78 bpm. Exam Location:  Inpatient Procedure: 2D Echo, Cardiac Doppler, Color Doppler and Strain Analysis Indications:    Stroke I63.9  History:        Patient has prior history of Echocardiogram examinations, most                 recent 12/01/2021. Stroke, Arrythmias:PVC,                 Signs/Symptoms:Dyspnea; Risk Factors:Hypertension, Dyslipidemia                 and Diabetes.  Sonographer:    Ronny Flurry Referring Phys: ERIC CHEN IMPRESSIONS  1. Left ventricular ejection fraction, by estimation, is 30 to 35%. Left ventricular ejection fraction by 3D volume is 37 %. The left ventricle has moderately decreased function. The left ventricle demonstrates global hypokinesis. The left ventricular internal cavity size was mildly dilated. There is mild left ventricular hypertrophy. Left ventricular diastolic parameters are consistent with Grade II diastolic dysfunction (pseudonormalization).  2. Right ventricular systolic function is normal. The right ventricular size is normal. Tricuspid regurgitation signal is inadequate for assessing PA pressure.  3. Left atrial size  was mildly dilated.  4. A small pericardial effusion is present. The pericardial effusion is circumferential.  5. The mitral valve is grossly normal. Mild mitral valve regurgitation. No evidence of mitral stenosis.  6. The aortic valve is abnormal. There is moderate calcification of the aortic valve. Aortic valve regurgitation is mild. Aortic valve sclerosis/calcification is present. Mild aortic valve stenosis noted on prior echocardiogram. Reduced gradient on today's exam may be  secondary to low flow state, with stroke volume index of 32 mL/m2.  7. The inferior vena cava is dilated in size with >50% respiratory variability, suggesting right atrial pressure of 8 mmHg. FINDINGS  Left Ventricle: Left ventricular ejection fraction, by estimation, is 30 to 35%. Left ventricular ejection fraction by 3D volume is 37 %. The left ventricle has moderately decreased function. The left ventricle demonstrates global hypokinesis. Global longitudinal strain performed but not reported based on interpreter judgement due to suboptimal tracking. The left ventricular internal cavity size was mildly dilated. There is mild left ventricular hypertrophy. Left ventricular diastolic parameters are consistent with Grade II diastolic dysfunction (pseudonormalization). Right Ventricle: The right ventricular size is normal. No increase in right ventricular wall thickness. Right ventricular systolic function is normal. Tricuspid regurgitation signal is inadequate for assessing PA pressure. Left Atrium: Left atrial size was mildly dilated. Right Atrium: Right atrial size was normal in size. Pericardium: A small pericardial effusion is present. The pericardial effusion is circumferential. Mitral Valve: The mitral valve is grossly normal. Mild mitral valve regurgitation. No evidence of mitral valve stenosis. Tricuspid Valve: The tricuspid valve is normal in structure. Tricuspid valve regurgitation is trivial. No evidence of tricuspid stenosis. Aortic Valve: The aortic valve is abnormal. There is moderate calcification of the aortic valve. Aortic valve regurgitation is mild. Aortic regurgitation PHT measures 476 msec. Aortic valve sclerosis/calcification is present, without any evidence of aortic stenosis. Aortic valve mean gradient measures 8.5 mmHg. Aortic valve peak gradient measures 15.3 mmHg. Aortic valve area, by VTI measures 1.53 cm. Pulmonic Valve: The pulmonic valve was normal in structure. Pulmonic valve  regurgitation is mild to moderate based on spectral Doppler profile. No evidence of pulmonic stenosis. Aorta: The aortic root is normal in size and structure. Venous: The inferior vena cava is dilated in size with greater than 50% respiratory variability, suggesting right atrial pressure of 8 mmHg. IAS/Shunts: No atrial level shunt detected by color flow Doppler.  LEFT VENTRICLE PLAX 2D LVIDd:         6.00 cm         Diastology LVIDs:         4.90 cm         LV e' medial:    3.53 cm/s LV PW:         1.20 cm         LV E/e' medial:  19.8 LV IVS:        0.90 cm         LV e' lateral:   4.35 cm/s LVOT diam:     2.20 cm         LV E/e' lateral: 16.1 LV SV:         55 LV SV Index:   32 LVOT Area:     3.80 cm        3D Volume EF                                LV  3D EF:    Left                                             ventricul                                             ar                                             ejection                                             fraction                                             by 3D                                             volume is                                             37 %.                                 3D Volume EF:                                3D EF:        37 %                                LV EDV:       182 ml                                LV ESV:       115 ml                                LV SV:        67 ml RIGHT VENTRICLE             IVC RV S prime:     12.90 cm/s  IVC diam: 2.30 cm TAPSE (M-mode): 1.9 cm LEFT ATRIUM           Index        RIGHT ATRIUM           Index LA diam:      4.10 cm  2.41 cm/m   RA Area:     14.65 cm LA Vol (A2C): 44.2 ml 26.00 ml/m  RA Volume:   34.55 ml  20.32 ml/m LA Vol (A4C): 55.3 ml 32.53 ml/m  AORTIC VALVE AV Area (Vmax):    1.59 cm AV Area (Vmean):   1.49 cm AV Area (VTI):     1.53 cm AV Vmax:           195.50 cm/s AV Vmean:          133.000 cm/s AV VTI:            0.357 m AV Peak Grad:      15.3 mmHg AV Mean  Grad:      8.5 mmHg LVOT Vmax:         81.90 cm/s LVOT Vmean:        52.100 cm/s LVOT VTI:          0.144 m LVOT/AV VTI ratio: 0.40 AI PHT:            476 msec  AORTA Ao Root diam: 3.20 cm Ao Asc diam:  3.40 cm MITRAL VALVE MV Area (PHT): 3.48 cm    SHUNTS MV Decel Time: 218 msec    Systemic VTI:  0.14 m MV E velocity: 70.00 cm/s  Systemic Diam: 2.20 cm MV A velocity: 70.80 cm/s MV E/A ratio:  0.99 Cherlynn Kaiser MD Electronically signed by Cherlynn Kaiser MD Signature Date/Time: 03/06/2022/3:13:50 PM    Final    VAS [place] LOWER EXTREMITY VENOUS (DVT)  Result Date: 03/06/2022  Lower Venous DVT Study Patient Name:  NAFIS FARNAN  Date of Exam:   03/06/2022 Medical Rec #: [id number]            Accession #:    [id number] Date of Birth: [birth year]             Patient Gender: M Patient Age:   38 years Exam Location[facility]  [facility] Procedure:      VAS [place] LOWER EXTREMITY VENOUS (DVT) Referring Phys: Cornelius Moras XU --------------------------------------------------------------------------------  Indications: Stroke.  Comparison Study: Prior negative left LEV done 12/07/21 and 11/19/21 Performing Technologist: Sharion Dove RVS  Examination Guidelines: A complete evaluation includes B-mode imaging, spectral Doppler, color Doppler, and power Doppler as needed of all accessible portions of each vessel. Bilateral testing is considered an integral part of a complete examination. Limited examinations for reoccurring indications may be performed as noted. The reflux portion of the exam is performed with the patient in reverse Trendelenburg.  +---------+---------------+---------+-----------+----------+--------------+ RIGHT    CompressibilityPhasicitySpontaneityPropertiesThrombus Aging +---------+---------------+---------+-----------+----------+--------------+ CFV      Full           Yes      Yes                                 +---------+---------------+---------+-----------+----------+--------------+ SFJ       Full                                                        +---------+---------------+---------+-----------+----------+--------------+ FV Prox  Full                                                        +---------+---------------+---------+-----------+----------+--------------+  FV Mid   Full                                                        +---------+---------------+---------+-----------+----------+--------------+ FV DistalFull                                                        +---------+---------------+---------+-----------+----------+--------------+ PFV      Full                                                        +---------+---------------+---------+-----------+----------+--------------+ POP      Full           Yes      Yes                                 +---------+---------------+---------+-----------+----------+--------------+ PTV      Full                                                        +---------+---------------+---------+-----------+----------+--------------+ PERO     Full                                                        +---------+---------------+---------+-----------+----------+--------------+   +---------+---------------+---------+-----------+----------+--------------+ LEFT     CompressibilityPhasicitySpontaneityPropertiesThrombus Aging +---------+---------------+---------+-----------+----------+--------------+ CFV      Full           Yes      Yes                                 +---------+---------------+---------+-----------+----------+--------------+ SFJ      Full                                                        +---------+---------------+---------+-----------+----------+--------------+ FV Prox  Full                                                        +---------+---------------+---------+-----------+----------+--------------+ FV Mid   Full                                                         +---------+---------------+---------+-----------+----------+--------------+  FV DistalFull                                                        +---------+---------------+---------+-----------+----------+--------------+ PFV      Full                                                        +---------+---------------+---------+-----------+----------+--------------+ POP      Full           Yes      Yes                                 +---------+---------------+---------+-----------+----------+--------------+ PTV      Full                                                        +---------+---------------+---------+-----------+----------+--------------+ PERO     Full                                                        +---------+---------------+---------+-----------+----------+--------------+     Summary: BILATERAL: - No evidence of deep vein thrombosis seen in the lower extremities, bilaterally. -No evidence of popliteal cyst, bilaterally.   *See table(s) above for measurements and observations. Electronically signed by Deitra Mayo MD on 03/06/2022 at 2:05:58 PM.    Final        Estill Cotta M.D. [facility] 03/08/2022, 9:09 AM  Available via Epic secure chat 7am-7pm After 7 pm, please refer to night coverage provider listed on amion.

## 2022-03-09 DIAGNOSIS — I5021 Acute systolic (congestive) heart failure: Secondary | ICD-10-CM | POA: Diagnosis not present

## 2022-03-09 DIAGNOSIS — E785 Hyperlipidemia, unspecified: Secondary | ICD-10-CM | POA: Diagnosis not present

## 2022-03-09 DIAGNOSIS — J09X1 Influenza due to identified novel influenza A virus with pneumonia: Secondary | ICD-10-CM | POA: Diagnosis not present

## 2022-03-09 DIAGNOSIS — E8589 Other amyloidosis: Secondary | ICD-10-CM | POA: Diagnosis not present

## 2022-03-09 DIAGNOSIS — I639 Cerebral infarction, unspecified: Secondary | ICD-10-CM | POA: Diagnosis not present

## 2022-03-09 LAB — BASIC METABOLIC PANEL
Anion gap: 13 (ref 5–15)
BUN: 24 mg/dL — ABNORMAL HIGH (ref 8–23)
CO2: 23 mmol/L (ref 22–32)
Calcium: 8.6 mg/dL — ABNORMAL LOW (ref 8.9–10.3)
Chloride: 99 mmol/L (ref 98–111)
Creatinine, Ser: 0.99 mg/dL (ref 0.61–1.24)
GFR, Estimated: >60 mL/min (ref >60)
Glucose, Bld: 156 mg/dL — ABNORMAL HIGH (ref 70–99)
Potassium: 4 mmol/L (ref 3.5–5.1)
Sodium: 135 mmol/L (ref 135–145)

## 2022-03-09 LAB — GLUCOSE, CAPILLARY
Glucose-Capillary: 187 mg/dL — ABNORMAL HIGH (ref 70–99)
Glucose-Capillary: 187 mg/dL — ABNORMAL HIGH (ref 70–99)
Glucose-Capillary: 182 mg/dL — ABNORMAL HIGH (ref 70–99)
Glucose-Capillary: 106 mg/dL — ABNORMAL HIGH (ref 70–99)

## 2022-03-09 LAB — HEMOGLOBIN A1C
Hgb A1c MFr Bld: 6.7 % — ABNORMAL HIGH (ref 4.8–5.6)
Hgb A1c MFr Bld: 6.5 % — ABNORMAL HIGH (ref 4.8–5.6)
Mean Plasma Glucose: 146 mg/dL
Mean Plasma Glucose: 140 mg/dL

## 2022-03-09 MED ORDER — LOSARTAN POTASSIUM 25 MG PO TABS
12.5000 mg | ORAL_TABLET | Freq: Every day | ORAL | Status: DC
  Administered 2022-03-09 – 2022-03-10 (×2): 12.5 mg via ORAL
  Filled 2022-03-09 (×3): qty 1

## 2022-03-09 MED ORDER — DAPAGLIFLOZIN PROPANEDIOL 10 MG PO TABS
10.0000 mg | ORAL_TABLET | Freq: Every day | ORAL | Status: DC
  Administered 2022-03-09 – 2022-03-22 (×13): 10 mg via ORAL
  Filled 2022-03-09 (×15): qty 1

## 2022-03-09 MED ORDER — INSULIN GLARGINE-YFGN 100 UNIT/ML ~~LOC~~ SOLN
10.0000 [IU] | Freq: Every day | SUBCUTANEOUS | Status: DC
  Administered 2022-03-10 – 2022-03-19 (×9): 10 [IU] via SUBCUTANEOUS
  Filled 2022-03-09 (×10): qty 0.1

## 2022-03-09 NOTE — Progress Notes (Signed)
PT Cancellation Note  Patient Details Name: William Barron MRN: [id number] DOB: Dec 29, 1945   Cancelled Treatment:    Reason Eval/Treat Not Completed: Patient at procedure or test/unavailable  Attempted to see at 1104 and pt just starting to eat his breakfast. Currently pt just began to eat his lunch. Will attempt again 03/10/22   Arby Barrette, [place]  Office [phone]   Rexanne Mano 03/09/2022, 3:12 PM

## 2022-03-09 NOTE — Progress Notes (Signed)
   Heart Failure Stewardship Pharmacist Progress Note   PCP: [name], MD PCP-Cardiologist: Minus Breeding, MD    HPI:  76 yo M with PMH of CAD, chronic LE edema, HLD, T2DM, and multiple myeloma / AL amyloidosis.   Presented to the ED on 12/22 with stroke like symptoms. CT confirmed right PCA territory stroke. Brain MRI showed acute to early subacute right PCA territory infarct with associated mild petechial blood products without frank hemorrhagic transformation or significant regional mass effect. MRI also showed a hypointense lesion within the posterior aspect of the right parotid gland. ECHO 12/23 showed LVEF 30-35% (was 55-60% in Sept 2023), global hypokinesis, mild LVH, G2DD, and RV normal. CXR 12/24 showed some evidence of pulmonary congestion. Received lasix without much improvement. Thought to be more related with flu and pneumonia.    Current HF Medications: Beta Blocker: carvedilol 3.125 mg BID ACE/ARB/ARNI: losartan 12.5 mg daily SGLT2i: Farxiga 10 mg daily  Prior to admission HF Medications: None  Pertinent Lab Values: Serum creatinine 0.99, BUN 24, Potassium 4.0, Sodium 135, BNP 1326.2, Magnesium 1.7, A1c 6.5   Vital Signs: Weight: 142 lbs (admission weight: 140 lbs) Blood pressure: 120/50s  Heart rate: 70-80s  I/O: -0.8L yesterday; net -2L  Medication Assistance / Insurance Benefits Check: Does the patient have prescription insurance?  Yes Type of insurance plan: Medicare + BCBS supplement  Outpatient Pharmacy:  Prior to admission outpatient pharmacy: [facility] Is the patient willing to use [facility] at discharge? Yes, if not going to SNF or CIR Is the patient willing to transition their outpatient pharmacy to utilize a [facility] outpatient pharmacy?   Pending    Assessment: 1. Acute systolic CHF (LVEF 71-06%). Ischemic evaluation to be completed after he recovers from his stroke. NYHA class II symptoms. - Appears euvolemic on exam, no indication for  diuretics - Continue carvedilol 3.125 mg BID - Agree with adding losartan 12.5 mg daily - Consider adding spironolactone tomorrow - Agree with adding Farxiga 10 mg daily   Plan: 1) Medication changes recommended at this time: - Agree with changes  - Add spironolactone tomorrow  2) Patient assistance: - None pending  3)  Education  - To be completed prior to discharge  Kerby Nora, PharmD, BCPS Heart Failure Cytogeneticist Phone [phone]

## 2022-03-09 NOTE — TOC Initial Note (Addendum)
Transition of Care [facility]) - Initial/Assessment Note    Patient Details  Name: William Barron MRN: [id number] Date of Birth: 11-21-1945  Transition of Care [facility]) CM/SW Contact:    Jinger Neighbors, LCSW Phone Number: 03/09/2022, 1:45 PM  Clinical Narrative:                 CSW met with pt at bedside to discuss aftercare plans. Pt reports he is going upstairs for rehab. CSW informed pt CIR signed off and he is not going upstairs because his wife reports she can not take care of him at home. Further explaining, in order to get into CIR, there has to be an able caregiver at home. CSW discussed SNF possibilities and he reports he wants [facility] and will ONLY go to [facility]. CSW reviewed Josie's note and will fu with [facility] admissions.  CSW left a voicemail for Whitney at [facility] asking for a returned call.   Expected Discharge Plan: Skilled Nursing Facility Barriers to Discharge: SNF Pending bed offer   Patient Goals and CMS Choice Patient states their goals for this hospitalization and ongoing recovery are:: Patient states he would like to get the best he can, but he does not know what that is CMS Medicare.gov Compare Post Acute Care list provided to:: Patient Choice offered to / list presented to : Patient      Expected Discharge Plan and Services       Living arrangements for the past 2 months: Single Family Home                                      Prior Living Arrangements/Services Living arrangements for the past 2 months: Single Family Home Lives with:: Spouse Patient language and need for interpreter reviewed:: Yes Do you feel safe going back to the place where you live?: Yes      Need for Family Participation in Patient Care: Yes (Comment) Care giver support system in place?: Yes (comment)   Criminal Activity/Legal Involvement Pertinent to Current Situation/Hospitalization: No - Comment as needed  Activities of Daily Living Home Assistive  Devices/Equipment: Walker (specify type), Eyeglasses ADL Screening (condition at time of admission) Patient's cognitive ability adequate to safely complete daily activities?: Yes Is the patient deaf or have difficulty hearing?: No Does the patient have difficulty seeing, even when wearing glasses/contacts?: No Does the patient have difficulty concentrating, remembering, or making decisions?: No Patient able to express need for assistance with ADLs?: Yes Does the patient have difficulty dressing or bathing?: Yes Independently performs ADLs?: No Communication: Independent Dressing (OT): Needs assistance Is this a change from baseline?: Change from baseline, expected to last >3 days Grooming: Needs assistance Is this a change from baseline?: Change from baseline, expected to last >3 days Feeding: Independent Is this a change from baseline?: Change from baseline, expected to last <3 days Bathing: Needs assistance Is this a change from baseline?: Change from baseline, expected to last <3 days Toileting: Needs assistance Is this a change from baseline?: Change from baseline, expected to last <3 days In/Out Bed: Needs assistance Is this a change from baseline?: Change from baseline, expected to last <3 days Walks in Home: Needs assistance Is this a change from baseline?: Change from baseline, expected to last <3 days Does the patient have difficulty walking or climbing stairs?: Yes Weakness of Legs: Both Weakness of Arms/Hands: Both  Permission Sought/Granted  Emotional Assessment Appearance:: Appears stated age Attitude/Demeanor/Rapport: Engaged Affect (typically observed): Adaptable Orientation: : Oriented to Self, Oriented to Place, Oriented to  Time, Oriented to Situation Alcohol / Substance Use: Not Applicable Psych Involvement: No (comment)  Admission diagnosis:  Occipital stroke ([place]) [I63.9] Acute ischemic stroke [facility]) [I63.9] Patient Active Problem List    Diagnosis Date Noted   Acute systolic CHF (congestive heart failure) ([place]) 03/08/2022   Influenza A with pneumonia 03/08/2022   Occipital stroke ([place]) 03/05/2022   Leg swelling 01/20/2022   Nonrheumatic aortic valve stenosis 01/20/2022   Abnormal CT of the abdomen    Pressure injury of skin 09/05/2021   Goals of care, counseling/discussion 09/03/2021   Postoperative anemia due to acute blood loss 08/31/2021   Reactive thrombocytosis 08/31/2021   Malnutrition of moderate degree 08/19/2021   Debility 08/19/2021   Asymptomatic bacteriuria 08/15/2021   H/O Whipple procedure 08/13/2021   Chronic hiccups 08/13/2021   Candida esophagitis ([place]) 08/13/2021   Insulin-requiring or dependent type II diabetes mellitus ([place]) 08/13/2021   Anemia 08/13/2021   Hypoalbuminemia 08/13/2021   Frequent PVCs 08/13/2021   Prolonged QT interval 08/13/2021   Facial droop 08/13/2021   Hormone replacement therapy 08/13/2021   Lesion of parotid gland 08/13/2021   Protein-calorie malnutrition, severe ([place]) 08/13/2021   Feeding difficulties 08/12/2021   Posterior tibial tendon dysfunction, right 04/15/2021   Posterior tibial tendinitis, right leg    Prostate cancer ([place]) 02/11/2020   Orthostatic tremor 10/01/2019   Dyslipidemia 09/12/2019   Nonrheumatic pulmonary valve insufficiency 09/12/2019   Red blood cell antibody positive, compatible PRBC difficult to obtain 02/15/2018   Diplopia 01/03/2018   Benign essential tremor 01/03/2018   Pain in joint of right ankle 05/17/2017   Foot pain 05/17/2017   Peroneal tendinitis 05/17/2017   Depression with anxiety 01/03/2017   Insomnia 05/05/2016   Coronary artery calcification seen on CAT scan    DOE (dyspnea on exertion)    Fatigue 02/27/2015   Elevated homocysteine 09/26/2014   Multiple myeloma ([place]) 09/26/2014   Acquired pes planus of right foot 07/02/2014   Posterior tibial tendon dysfunction 07/02/2014   Encounter for antineoplastic chemotherapy  01/02/2014   Kahler disease ([place]) 01/02/2014   Avitaminosis D 01/02/2014   Amyloidosis ([place]) 03/26/2013   Enlarged lymph node 02/14/2013   Pure hypercholesterolemia 03/29/2011   Memory disorder 09/21/2010   Labile hypertension 09/21/2010   PCP:  [name], MD Pharmacy:   [id number] [place]Phone: [phone]: [phone] [address] [address]Phone: [phone]: [phone] [facility] [address]Phone: [phone]: [phone]    Social Determinants of Health (SDOH) Social History: SSundown No Food Insecurity (03/09/2022)  Housing: Low Risk  (03/09/2022)  Transportation Needs: No Transportation Needs (03/09/2022)  Utilities: Not At Risk (03/09/2022)  Tobacco Use: Medium Risk (03/05/2022)   SDOH Interventions:     Readmission Risk Interventions     No data to display

## 2022-03-09 NOTE — PMR Pre-admission (Addendum)
PMR Admission Coordinator Pre-Admission Assessment  Patient: William Barron is an 76 y.o., male MRN: [id number] DOB: [birth year] Height: _0  (165.1 cm) Weight: 55.7 kg  Insurance Information HMO:     PPO:      PCP:      IPA:      80/20: yes     OTHER:  PRIMARY: [facility]        Policy#: [id number]         Subscriber: Pt. Phone#: Verified online    Fax#: [phone] phone [phone] Pre-Cert#:       Employer: Cassie with the [employer] emailed approval for admit 1/5.Approved for 30 days.  Benefits:  Phone #:      Name:  Eff. Date: effective 03/19/21   Out of Pocket Max:  None      Life Max: N/Barron  CIR: 100%      SNF: Outpatient: Co-Pay: Home Health:      Co-Pay: none DME:      Co-Pay:  Providers: patient'Barron choice  SECONDARY: medicare Barron/B     Policy#: [id number]  Phone#:   Development worker, community:       Phone#:   The Therapist, art Information Summary" for patients in Inpatient Rehabilitation Facilities with attached "Privacy Act [place] Records" was provided and verbally reviewed with: Patient  Emergency Contact Information Contact Information     Name Relation Home Work Mobile   [place] Spouse [phone]  [phone]   William Barron   [phone]       Current Medical History  Patient Admitting Diagno38 year old male history of amyloidosis, multiple myeloma, history of prostate cancer, history of pancreatic cancer status post Whipple, dyslipidemia, diabetes insulin requiring presents to the [facility] ED on 03/05/22 with right-sided headache and blurred vision.  CT showed right PCA infarct.  CTA head and neck right PCA distal P3 occlusion, left VA origin stenosis.  MRI confirmed right PCA infarct.  EF 30 to 35%, LE venous Doppler no DVT.  LDL 66, K3.3, creatinine 0.89.Marland Kitchen Pt. With acute HFrEF. No prior history of reduced EF. Cardiology consulted. Transthoracic Echocardiogram significant for LVEF of 30-35% with normal RV function. Left/right heart  catheterization significant for moderate CAD but per cardiology, did not explain reduction in LVEF. Cardiac MRI confirmed an LVEF of 33% with diffuse hypokinesis in addition to RVEF of 46%. Although patient has Barron history of AL amyloidosis, per cardiology, findings are not consistent with cardiac involvement of AL amyloidosis. Cardiology recommended continue Entresto, Farxiga, spironolactone and Crestor. Pt also had code blue called on 03/11/22. Pt received compressions before regaining ROSC Barron/p VT arrest. Pt underwent cardiac cath on 03/11/22. Pt. Positive for Influenza Barron infection  During admission and completed oseltamivir course.Remains on droplets/contact precautions.  CXR showed bibasilar lung infiltrates and patient started empirically on antibiotics for possible infection  Pt. Seen by SLP  who recommend dysphagia 2 diet with thin liquids. Pt. With poor intake during admission, and had coretrak placed for nutrition support.  Pt. Seen by PT/OT and they recommend CIR to assist return to PLOF.    Complete NIHSS TOTAL: 0  Patient'Barron medical record from [facility]  has been reviewed by the rehabilitation admission coordinator and physician.  Past Medical History  Past Medical History:  Diagnosis Date   Amyloidosis ([place])    Diabetes mellitus without complication ([place])    Feeding tube dysfunction, initial encounter 08/12/2021   GERD (gastroesophageal reflux disease)    Hearing loss    Has hearing  aids   History of kidney stones    Hyperlipidemia    Jejunostomy tube present (HCC)    Multiple myeloma (HCC)    and amylodosis    Nephrolithiasis    Occasional tremors    Pancreatic insufficiency    Pneumonia    Prostate cancer ([place])    Prostatitis    acute and chronic   Skin cancer     Has the patient had major surgery during 100 days prior to admission? No  Family History   family history includes CAD (age of onset: 61) in his brother; Cancer in his paternal grandmother;  Pancreatic cancer in his father; Parkinsonism in his mother; Prostate cancer in his father.  Current Medications  Current Facility-Administered Medications:    0.9 %  sodium chloride infusion, 250 mL, Intravenous, PRN, William Barron   acetaminophen (TYLENOL) tablet 650 mg, 650 mg, Oral, Q4H PRN, 650 mg at 03/12/22 1741 **OR** [DISCONTINUED] acetaminophen (TYLENOL) 160 MG/5ML solution 650 mg, 650 mg, Per Tube, Q4H PRN **OR** acetaminophen (TYLENOL) suppository 650 mg, 650 mg, Rectal, Q4H PRN, William Barron   acidophilus (RISAQUAD) capsule 1 capsule, 1 capsule, Oral, Daily, William Barron, 1 capsule at 03/21/22 1002   amiodarone (PACERONE) tablet 400 mg, 400 mg, Oral, BID, 400 mg at 03/21/22 2150 **FOLLOWED BY** [START ON 03/26/2022] amiodarone (PACERONE) tablet 400 mg, 400 mg, Oral, Daily, William Barron   aspirin EC tablet 325 mg, 325 mg, Oral, Daily, William Barron, William Barron, Barron, 325 mg at 03/21/22 1002   clopidogrel (PLAVIX) tablet 75 mg, 75 mg, Oral, Daily, William Barron, 75 mg at 03/21/22 1002   dapagliflozin propanediol (FARXIGA) tablet 10 mg, 10 mg, Oral, Daily, William Barron, William Barron, Barron, 10 mg at 03/21/22 1003   diclofenac Sodium (VOLTAREN) 1 % topical gel 2 g, 2 g, Topical, QID, William Barron, 2 g at 03/21/22 2151   feeding supplement (JEVITY 1.5 CAL/FIBER) liquid 1,000 mL, 1,000 mL, Per Tube, Continuous, William Barron, Last Rate: 50 mL/hr at 03/22/22 0543, 1,000 mL at 03/22/22 0543   feeding supplement (PROSource TF20) liquid 60 mL, 60 mL, Per Tube, Daily, William Barron, 60 mL at 03/21/22 1005   heparin injection 5,000 Units, 5,000 Units, Subcutaneous, Q8H, William Barron, 5,000 Units at 03/22/22 0514   insulin aspart (novoLOG) injection 0-9 Units, 0-9 Units, Subcutaneous, TID WC, William Barron, 5 Units at 03/22/22 0650   insulin aspart (novoLOG) injection 5 Units, 5 Units, Subcutaneous, TID WC, Vann, Jessica  Barron, Barron, 5 Units at 03/21/22 1234   insulin glargine-yfgn (SEMGLEE) injection 15 Units, 15 Units, Subcutaneous, Daily, Nettey, Ralph Barron, Barron   ipratropium-albuterol (DUONEB) 0.5-2.5 (3) MG/3ML nebulizer solution 3 mL, 3 mL, Nebulization, Q4H PRN, William Barron, William Barron, Barron   lidocaine (LIDODERM) 5 % 1 patch, 1 patch, Transdermal, Q24H, Dana Allan I, Barron, 1 patch at 03/20/22 1830   lipase/protease/amylase (CREON) capsule 72,000 Units, 72,000 Units, Oral, Q6H, William Barron, 72,000 Units at 03/20/22 1831   megestrol (MEGACE) 400 MG/10ML suspension 400 mg, 400 mg, Oral, Daily, Dana Allan I, Barron, 400 mg at 03/20/22 0948   mirabegron ER (MYRBETRIQ) tablet 25 mg, 25 mg, Oral, Daily, William Barron, William Barron, Barron, 25 mg at 03/21/22 1002   nystatin (MYCOSTATIN) 100000 UNIT/ML suspension 500,000 Units, 5 mL, Oral, QID, Dana Allan I, Barron, 500,000 Units at 03/21/22 2150   Oral care mouth rinse, 15 mL, Mouth Rinse,  4 times per day, Earnie Larsson, NP   Oral care mouth rinse, 15 mL, Mouth Rinse, PRN, Forestine Na L, NP   pantoprazole (PROTONIX) EC tablet 40 mg, 40 mg, Oral, BID, William Barron, 40 mg at 03/21/22 2150   rosuvastatin (CRESTOR) tablet 10 mg, 10 mg, Oral, Daily, Larey Dresser, Barron, 10 mg at 03/21/22 2150   sacubitril-valsartan (ENTRESTO) 24-26 mg per tablet, 1 tablet, Oral, BID, Lyda Jester M, PA-C, 1 tablet at 03/21/22 2150   sodium chloride flush (NS) 0.9 % injection 3 mL, 3 mL, Intravenous, Q12H, William Barron, William Barron, Barron, 3 mL at 03/21/22 2151   sodium chloride flush (NS) 0.9 % injection 3 mL, 3 mL, Intravenous, PRN, William Barron, 3 mL at 03/13/22 2125   spironolactone (ALDACTONE) tablet 12.5 mg, 12.5 mg, Oral, Daily, William Barron, William Barron, Barron, 12.5 mg at 03/21/22 1002   testosterone (ANDROGEL) 50 MG/5GM (1%) gel 5 g, 5 g, Transdermal, QHS, Nettey, Evalee Jefferson, Barron   white petrolatum (VASELINE) gel, , Topical, PRN, Geradine Girt, Barron, Given at 03/22/22  5852  Patients Current Diet:  Diet Order             DIET DYS 2 Room service appropriate? Yes with Assist; Fluid consistency: Thin; Fluid restriction: 1200 mL Fluid  Diet effective now                   Precautions / Restrictions Precautions Precautions: Fall, Other (comment) Precaution Comments: vision impaired, feeding tube Other Brace: RLE AFO and shoes, pt and son think he does worse with AFO on Restrictions Weight Bearing Restrictions: No   Has the patient had 2 or more falls or Barron fall with injury in the past year? Yes  Prior Activity Level Community (5-7x/wk): Pt. went out for appts PTA  Prior Functional Level Self Care: Did the patient need help bathing, dressing, using the toilet or eating? Independent  Indoor Mobility: Did the patient need assistance with walking from room to room (with or without device)? Independent  Stairs: Did the patient need assistance with internal or external stairs (with or without device)? Needed some help  Functional Cognition: Did the patient need help planning regular tasks such as shopping or remembering to take medications? Needed some help  Patient Information Are you of Hispanic, Latino/Barron,or Spanish origin?: Barron. No, not of Hispanic, Latino/Barron, or Spanish origin What is your race?: Barron. White Barron you need or want an interpreter to communicate with Barron doctor or health care staff?: 0. No  Patient'Barron Response To:  Health Literacy and Transportation Is the patient able to respond to health literacy and transportation needs?: Yes Health Literacy - How often Barron you need to have someone help you when you read instructions, pamphlets, or other written material from your doctor or pharmacy?: Never In the past 12 months, has Barron of transportation kept you from medical appointments or from getting medications?: No In the past 12 months, has Barron of transportation kept you from meetings, work, or from getting things needed for daily living?:  No  Home Assistive Devices / [place] Devices/Equipment: Environmental consultant (specify type), Eyeglasses Home Equipment: Shower seat, Conservation officer, nature (2 wheels), Rollator (4 wheels), BSC/3in1, Wheelchair - manual  Prior Device Use: Indicate devices/aids used by the patient prior to current illness, exacerbation or injury? Walker  Current Functional Level Cognition  Arousal/Alertness: Awake/alert Overall Cognitive Status: Impaired/Different from baseline Current Attention Level: Selective Orientation Level: Oriented X4 Following Commands: Follows  one step commands consistently, Follows one step commands with increased time, Follows multi-step commands inconsistently Safety/Judgement: Decreased awareness of safety, Decreased awareness of deficits General Comments: Less verbal today, concentrating on breathing. Attention: Sustained Sustained Attention: Appears intact Memory: Impaired Memory Impairment: Retrieval deficit Awareness: Appears intact Problem Solving: Appears intact (stated he "knows he needs walker for further distances") Safety/Judgment: Other (comment) (appears grossly WFL)    Extremity Assessment (includes Sensation/Coordination)  Upper Extremity Assessment: RUE deficits/detail, LUE deficits/detail RUE Deficits / Details: intentional tremor at baseline. generally weak, overall WFL for tasks assessed LUE Deficits / Details: globally weak, increased time and effort needed for coordination and AROM. intentional tremor at baseline LUE Coordination: decreased fine motor, decreased gross motor  Lower Extremity Assessment: Defer to PT evaluation RLE Deficits / Details: Rt ankle DF 3+/5, quad and hip flexor 4/5 RLE Sensation: WNL    ADLs  Overall ADL'Barron : Needs assistance/impaired Eating/Feeding: Minimal assistance, Sitting Eating/Feeding Details (indicate cue type and reason): requires min assist to use fork with R hand, limited by tremors Grooming: Sitting,  Supervision/safety Upper Body Bathing: Supervision/ safety, Sitting Lower Body Bathing: Minimal assistance, Sit to/from stand Upper Body Dressing : Supervision/safety, Sitting Lower Body Dressing: Minimal assistance, Sitting/lateral leans, Sit to/from stand Lower Body Dressing Details (indicate cue type and reason): able to manage socks, min assist in standing and relies on BUE support Toilet Transfer: Moderate assistance, +2 for physical assistance, +2 for safety/equipment, Ambulation, BSC/3in1, Rolling walker (2 wheels) Toilet Transfer Details (indicate cue type and reason): simulated Functional mobility during ADLs: Minimal assistance, Rolling walker (2 wheels), Cueing for safety General ADL Comments: sit<>stand 3x with mod Barron +2. tolerated 54f ambulation with RW 3x. limited by activity tolerance and weakness    Mobility  Overal bed mobility: Needs Assistance Bed Mobility: Supine to Sit Supine to sit: Min guard, HOB elevated Sit to supine: Min guard, HOB elevated General bed mobility comments: Min guard for safety. use of rails and increased time.    Transfers  Overall transfer level: Needs assistance Equipment used: Rolling walker (2 wheels) Transfers: Sit to/from Stand Sit to Stand: Mod assist, +2 safety/equipment Bed to/from chair/wheelchair/BSC transfer type:: Step pivot Step pivot transfers: Max assist (Assistance for RW management, cuing for sequencing & to reach back for recliner prior to sitting.) General transfer comment: Mod Barron to power to standing with cues for hand placement/technique. Stood from EGoogle from chair x2, flexed posture at hips/knees and tremors present throughout.    Ambulation / Gait / Stairs / Wheelchair Mobility  Ambulation/Gait Ambulation/Gait assistance: Mod assist Gait Distance (Feet): 8 Feet (+8' + 10') Assistive device: Rolling walker (2 wheels) Gait Pattern/deviations: Narrow base of support, Decreased stride length, Trunk flexed, Knee flexed in  stance - right, Knee flexed in stance - left, Shuffle, Step-to pattern, Decreased step length - left General Gait Details: Slow, unsteady gait with decreased step length LLE, flexed posture throughout and assist needed for forward momentum and RW management. 2 seated rest breaks. 3/4 DOE. Sp02 dropped to 86% on last bout on 3L/min 02 Anchor. Cues for pursed lip breathing. Gait velocity: decreased Gait velocity interpretation: <1.31 ft/sec, indicative of household ambulator Pre-gait activities: Pt initially is very wobbly but is able to get balance with physical assistance.    Posture / Balance Dynamic Sitting Balance Sitting balance - Comments: Prefers UE support, supervision for static sitting balance Balance Overall balance assessment: Needs assistance Sitting-balance support: Feet supported, No upper extremity supported Sitting balance-Leahy Scale: Fair Sitting balance -  Comments: Prefers UE support, supervision for static sitting balance Postural control: Left lateral lean, Posterior lean Standing balance support: During functional activity, Bilateral upper extremity supported, Reliant on assistive device for balance Standing balance-Leahy Scale: Poor Standing balance comment: relies on BUE and external support    Special needs/care consideration Skin intact and Special service needs coretrak   Previous Home Environment (from acute therapy documentation) Living Arrangements: Spouse/significant other  Lives With: Spouse Available Help at Discharge: Family, Available 24 hours/day Type of Home: House Home Layout: Two level, 1/2 bath on main level Alternate Level Stairs-Rails: Left Alternate Level Stairs-Number of Steps: has chair lift Home Access: Stairs to enter Entrance Stairs-Rails: Right Entrance Stairs-Number of Steps: 4 Bathroom Shower/Tub: Multimedia programmer: Standard Bathroom Accessibility: Yes Home Care Services: No Additional Comments: Using RW to ambulate at  home. Wife cooks meals.  Discharge Living Setting Plans for Discharge Living Setting: Patient'Barron home Type of Home at Discharge: House Discharge Home Layout: Two level, 1/2 bath on main level, Other (Comment) Alternate Level Stairs-Rails: Left Discharge Home Access: Stairs to enter Entrance Stairs-Rails: Right Entrance Stairs-Number of Steps: 4 Discharge Bathroom Shower/Tub: Walk-in shower Discharge Bathroom Toilet: Standard Discharge Bathroom Accessibility: No Does the patient have any problems obtaining your medications?: No  Social/Family/Support Systems Patient Roles: Other (Comment) Contact Information: [phone] Anticipated Caregiver: [name] (son) Anticipated Caregiver'Barron Contact Information: Wife unwilling to provide more than intermittent supervision. Son coming to live with pt. and care for him after CIR Ability/Limitations of Caregiver: Min Barron Caregiver Availability: 24/7 Discharge Plan Discussed with Primary Caregiver: Yes Is Caregiver In Agreement with Plan?: Yes Does Caregiver/Family have Issues with Lodging/Transportation while Pt is in Rehab?: No  Goals Patient/Family Goal for Rehab: PT/OT Supervision to Min Barron, SLP supervision to mod I Expected length of stay: 14-16 days Pt/Family Agrees to Admission and willing to participate: Yes Program Orientation Provided & Reviewed with Pt/Caregiver Including Roles  & Responsibilities: Yes  Decrease burden of Care through IP rehab admission: none   Possible need for SNF placement upon discharge: n/Barron  Patient Condition: I have reviewed medical records from [facility] , spoken with CM, and patient, spouse, and family member. I met with patient at the bedside for inpatient rehabilitation assessment.  Patient will benefit from ongoing PT and OT, can actively participate in 3 hours of therapy Barron day 5 days of the week, and can make measurable gains during the admission.  Patient will also benefit from the coordinated  team approach during an Inpatient Acute Rehabilitation admission.  The patient will receive intensive therapy as well as Rehabilitation physician, nursing, social worker, and care management interventions.  Due to safety, skin/wound care, disease management, medication administration, pain management, and patient education the patient requires 24 hour Barron day rehabilitation nursing.  The patient is currently min Barron  with mobility and basic ADLs.  Discharge setting and therapy post discharge at home with home health is anticipated.  Patient has agreed to participate in the Acute Inpatient Rehabilitation Program and will admit today.  Preadmission Screen Completed By:  Genella Mech, 03/22/2022 9:38 AM ______________________________________________________________________   Discussed status with Dr. Naaman Plummer  on 03/22/21 at 76 and received approval for admission today.  Admission Coordinator:  Genella Mech, CCC-SLP, time 1148/Date 03/22/21   Assessment/Plan: Diagnosis: right PCA infarct, multiple medical Does the need for close, 24 hr/day Medical supervision in concert with the patient'Barron rehab needs make it unreasonable for this patient to be served in Barron  less intensive setting? Yes Co-Morbidities requiring supervision/potential complications: cad, vt, prostate cancer, acsCHF,  Due to bladder management, bowel management, safety, skin/wound care, disease management, medication administration, pain management, and patient education, does the patient require 24 hr/day rehab nursing? Yes Does the patient require coordinated care of Barron physician, rehab nurse, PT, OT, and SLP to address physical and functional deficits in the context of the above medical diagnosis(es)? Yes Addressing deficits in the following areas: balance, endurance, locomotion, strength, transferring, bowel/bladder control, bathing, dressing, feeding, grooming, toileting, cognition, swallowing, and psychosocial support Can the patient actively  participate in an intensive therapy program of at least 3 hrs of therapy 5 days Barron week? Yes The potential for patient to make measurable gains while on inpatient rehab is excellent Anticipated functional outcomes upon discharge from inpatient rehab: supervision and min assist PT, supervision and min assist OT, modified independent SLP Estimated rehab length of stay to reach the above functional goals is: 14-17 days Anticipated discharge destination: Home 10. Overall Rehab/Functional Prognosis: excellent   Barron Signature: Meredith Staggers, Barron, [place] Director Rehabilitation Services 03/22/2022

## 2022-03-09 NOTE — Progress Notes (Signed)
Cardiology Progress Note  Patient ID: William Barron MRN: [id number] DOB: Jan 25, 1946 Date of Encounter: 03/09/2022  Primary Cardiologist: Minus Breeding, MD  Subjective   Chief Complaint: Shortness of breath  HPI: Diagnosed with influenza A.  Reports he has cough.  Diffuse rhonchi and wheezing noted bilaterally.  ROS:  All other ROS reviewed and negative. Pertinent positives noted in the HPI.     Inpatient Medications  Scheduled Meds:  aspirin EC  325 mg Oral Daily   carvedilol  3.125 mg Oral BID WC   Chlorhexidine Gluconate Cloth  6 each Topical Daily   clopidogrel  75 mg Oral Daily   dapagliflozin propanediol  10 mg Oral Daily   insulin aspart  0-9 Units Subcutaneous TID WC   insulin aspart  3 Units Subcutaneous TID WC   insulin glargine-yfgn  8 Units Subcutaneous Daily   lipase/protease/amylase  72,000 Units Oral TID WC   losartan  12.5 mg Oral Daily   mirabegron ER  25 mg Oral Daily   oseltamivir  30 mg Oral BID   pantoprazole  40 mg Oral Daily   Continuous Infusions:  PRN Meds: acetaminophen **OR** acetaminophen (TYLENOL) oral liquid 160 mg/5 mL **OR** acetaminophen, ipratropium-albuterol   Vital Signs   Vitals:   03/08/22 1805 03/08/22 2008 03/09/22 0315 03/09/22 0902  BP: (!) 150/83 (!) 123/93 117/72 (!) 120/57  Pulse: 85 (!) 48 79 78  Resp: '17 20 16 17  '$ Temp: (!) 97.5 F (36.4 C) 98.2 F (36.8 C) (!) 97.3 F (36.3 C) 98.1 F (36.7 C)  TempSrc: Oral Axillary Oral Axillary  SpO2: 100% (!) 88% 97% 95%  Weight:      Height:        Intake/Output Summary (Last 24 hours) at 03/09/2022 0958 Last data filed at 03/08/2022 2000 Gross per 24 hour  Intake --  Output 300 ml  Net -300 ml      03/08/2022    5:00 AM 03/07/2022    6:00 AM 03/05/2022    4:19 PM  Last 3 Weights  Weight (lbs) 142 lb 10.2 oz 140 lb 3.4 oz 140 lb  Weight (kg) 64.7 kg 63.6 kg 63.504 kg      Telemetry  Overnight telemetry shows SR 80s, which I personally reviewed.    Physical Exam   Vitals:   03/08/22 1805 03/08/22 2008 03/09/22 0315 03/09/22 0902  BP: (!) 150/83 (!) 123/93 117/72 (!) 120/57  Pulse: 85 (!) 48 79 78  Resp: '17 20 16 17  '$ Temp: (!) 97.5 F (36.4 C) 98.2 F (36.8 C) (!) 97.3 F (36.3 C) 98.1 F (36.7 C)  TempSrc: Oral Axillary Oral Axillary  SpO2: 100% (!) 88% 97% 95%  Weight:      Height:        Intake/Output Summary (Last 24 hours) at 03/09/2022 0958 Last data filed at 03/08/2022 2000 Gross per 24 hour  Intake --  Output 300 ml  Net -300 ml       03/08/2022    5:00 AM 03/07/2022    6:00 AM 03/05/2022    4:19 PM  Last 3 Weights  Weight (lbs) 142 lb 10.2 oz 140 lb 3.4 oz 140 lb  Weight (kg) 64.7 kg 63.6 kg 63.504 kg    Body mass index is 23.74 kg/m.  General: Well nourished, well developed, in no acute distress Head: Atraumatic, normal size  Eyes: PEERLA, EOMI  Neck: Supple, no JVD Endocrine: No thryomegaly Cardiac: Normal S1, S2; RRR; no murmurs, rubs, or  gallops Lungs: Diffuse rales and rhonchi bilaterally Abd: Soft, nontender, no hepatomegaly  Ext: No edema, pulses 2+ Musculoskeletal: No deformities, BUE and BLE strength normal and equal Skin: Warm and dry, no rashes   Neuro: Alert and oriented to person, place, time, and situation, CNII-XII grossly intact, no focal deficits  Psych: Normal mood and affect   Labs  High Sensitivity Troponin:  No results for input(s): "TROPONINIHS" in the last 720 hours.   Cardiac EnzymesNo results for input(s): "TROPONINI" in the last 168 hours. No results for input(s): "TROPIPOC" in the last 168 hours.  Chemistry Recent Labs  Lab 03/05/22 1632 03/05/22 1725 03/06/22 0615 03/07/22 0723 03/08/22 0303 03/09/22 0435  NA 139   < > 138 137 136 135  K 3.2*   < > 3.3* 3.4* 3.8 4.0  CL 106   < > 108 107 101 99  CO2 23  --  '22 22 23 23  '$ GLUCOSE 128*   < > 63* 184* 143* 156*  BUN 13   < > '10 10 14 '$ 24*  CREATININE 0.98   < > 0.89 0.98 1.10 0.99  CALCIUM 9.1  --  8.5* 8.5*  8.9 8.6*  PROT 7.1  --  5.6*  --   --   --   ALBUMIN 3.5  --  2.7*  --   --   --   AST 39  --  28  --   --   --   ALT 33  --  26  --   --   --   ALKPHOS 727*  --  564*  --   --   --   BILITOT 0.4  --  0.5  --   --   --   GFRNONAA >60  --  >60 >60 >60 >60  ANIONGAP 10  --  '8 8 12 13   '$ < > = values in this interval not displayed.    Hematology Recent Labs  Lab 03/05/22 1632 03/05/22 1725 03/07/22 0723  WBC 7.0  --  7.0  RBC 4.35  --  3.88*  HGB 10.4* 11.9* 9.3*  HCT 34.1* 35.0* 29.7*  MCV 78.4*  --  76.5*  MCH 23.9*  --  24.0*  MCHC 30.5  --  31.3  RDW 18.2*  --  17.9*  PLT 419*  --  334   BNP Recent Labs  Lab 03/07/22 1237  BNP 1,326.2*    DDimer No results for input(s): "DDIMER" in the last 168 hours.   Radiology  DG CHEST PORT 1 VIEW  Result Date: 03/07/2022 CLINICAL DATA:  220523 Productive cough [id number] 10026 Shortness of breath 10026 EXAM: PORTABLE CHEST 1 VIEW COMPARISON:  September 03, 2021 FINDINGS: The cardiomediastinal silhouette is unchanged in contour. Small LEFT pleural effusion. Trace RIGHT pleural effusion. No pneumothorax. Increased diffuse interstitial prominence, peribronchial cuffing and perihilar vascular prominence. Mildly increased bibasilar reticular nodularity. IMPRESSION: Constellation of findings are favored to reflect pulmonary edema with small LEFT and trace RIGHT pleural effusions. Differential considerations include atypical pulmonary infection Electronically Signed   By: Valentino Saxon M.D.   On: 03/07/2022 10:43    Cardiac Studies  TTE 03/06/2022  1. Left ventricular ejection fraction, by estimation, is 30 to 35%. Left  ventricular ejection fraction by 3D volume is 37 %. The left ventricle has  moderately decreased function. The left ventricle demonstrates global  hypokinesis. The left ventricular  internal cavity size was mildly dilated. There is mild left ventricular  hypertrophy. Left  ventricular diastolic parameters are consistent with   Grade II diastolic dysfunction (pseudonormalization).   2. Right ventricular systolic function is normal. The right ventricular  size is normal. Tricuspid regurgitation signal is inadequate for assessing  PA pressure.   3. Left atrial size was mildly dilated.   4. A small pericardial effusion is present. The pericardial effusion is  circumferential.   5. The mitral valve is grossly normal. Mild mitral valve regurgitation.  No evidence of mitral stenosis.   6. The aortic valve is abnormal. There is moderate calcification of the  aortic valve. Aortic valve regurgitation is mild. Aortic valve  sclerosis/calcification is present. Mild aortic valve stenosis noted on  prior echocardiogram. Reduced gradient on  today's exam may be secondary to low flow state, with stroke volume index  of 32 mL/m2.   7. The inferior vena cava is dilated in size with >50% respiratory  variability, suggesting right atrial pressure of 8 mmHg.   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 Cardiology consulted due to newly reduced ejection fraction.   Assessment & Plan   # New onset systolic heart failure, EF 35 to 40% -Currently admitted with stroke.  Etiology unclear. -Diagnosed with influenza A.  Could be contributing. -Chest x-ray did show some evidence of pulmonary congestion.  He was given Lasix.  Really did not improve his symptoms.  Lung exam more consistent with influenza and pneumonia. -Likely will benefit from ischemia evaluation however this will occur once he is recovered from his stroke.  No rush to do this. -He does have a history of systemic amyloidosis but there is no evidence of cardiac involvement on my review of his echo. -Continue carvedilol 3.125 mg twice daily.   Add losartan 12.5 mg today.  Add Farxiga 10 mg daily.  Likely add Aldactone tomorrow.  Blood pressure is stable. -Lasix as needed.  # Acute right PCA stroke # Chronic cerebral microvascular disease -Admitted with stroke.  Aspirin and Plavix per neurology. -They requested a loop recorder to be placed prior to discharge.  # Nonobstructive CAD -He is on aspirin and Plavix due to stroke.  Not on a statin due to decline in the past.  # Influenza A -oseltamivir per hospital team    For questions or updates, please contact [place] Please consult [email or web address] for contact info under   Signed, [place] B[place] T. Audie Box, MD, [place]  03/09/2022 9:58 AM

## 2022-03-09 NOTE — Progress Notes (Addendum)
[facility]                                                                              Nancy Manuele, is a 76 y.o. male, DOB - 1945/09/30, XTK:[id number] Admit date - 03/05/2022    Outpatient Primary MD for the patient is [name], MD  LOS - 3  days  Chief Complaint  Patient presents with   Headache       Brief summary   Patient is a 76 year old male with history of pancreatic CA status post Whipple, prostate CA, multiple myeloma, amyloidosis, dyslipidemia, DM, presented to ED with right-sided headache and blurred vision.  Last known well was in the evening of 03/04/2022.  He woke up on the morning of admission on 12/22 with difficulty seeing out of his right eye.  He went to see an ophthalmologist and was referred to ER CT head showed acute/subacute right occipital CVA Failed swallow evaluation Neurology was consulted, admitted for further workup..   Assessment & Plan    Principal Problem: Acute right PCA stroke (HCC) right CN VI palsy, chronic -Presented with visual issues, having difficulty seeing half of the clock.  CT head showed right PCA territory stroke. -MRI brain showed acute to early subacute right PCA territory infarct.  Associated mild petechial blood products without frank hemorrhagic transformation or significant residual mass effect.  Underlying moderate chronic microvascular ischemic disease -CTA head and neck showed acute or early subacute infarct in the right occipital lobe, suspected high-grade stenosis versus occlusion of the distal right PCA. -2D echo: EF of 35 to 40% with global hypokinesis, mild AS, mild pulmonary regurg, mild MR, mild AR (EF 50 to 55% on echo in 11/2021) -Per neuro, continue aspirin 325 mg and Plavix 75 mg DAPT for 3 months given intracranial stenosis/occlusion then aspirin 81 mg alone daily indefinitely  -Lipid panel showed LDL 66, at goal, no need of statin per neurology -Hemoglobin A1c 6.5 -Loop recorder per  neuro, timing per cardiology  Active Problems: New onset systolic CHF, EF 35 to 99% -2D echo showed EF of 35 to 40% with global hypokinesis (was 50 to 55% on echo in 11/2021) -Received Lasix 20 mg IV x 1 on 12/24 -BNP 1326.2, received Lasix 20 mg IV x 1 on 12/24 -Continue Coreg, added losartan 12.5 mg daily, Farxiga 10 mg daily today, Lasix as needed  Influenza A positive -COVID-19 negative, positive for influenza A, started on Tamiflu 30 mg twice daily on 12/24, day #3 today  Hypokalemia -K 3.8  GERD -Continue PPI  Diabetes mellitus type 2, IDDM HbA1c 6.5 -Increase Semglee to 10 units daily, continue NovoLog 3 units 3 times daily AC, continue SSI   History of pancreatic CA status post Whipple procedure -Continue Creon 72,000 units 3 times daily with meals -Follows with [name]  History of AL amyloidosis ([place]),  Multiple myeloma ([place]) -Outpatient follow-up with his oncologist, [name] at [facility].  History of prostate CA -Continue outpatient follow-up with urology, Dr. Jeffie Pollock, completed XRT on 06/02/2020    Lesion of parotid gland -MRI brain incidentally also showed 1.8 cm T2 hypointense lesion within the  posterior aspect of the right parotid gland, indeterminate.  Recommended nonemergent outpatient ENT evaluation -CTA showed 1.6 cm partially calcified lesion in the posterior aspect of right parotid gland with multiple surrounding smaller lesion, could potentially represent primary parotid neoplasm benign or malignant or enlarged lymph nodes, recommended outpatient ENT evaluation  Code Status: Full code DVT Prophylaxis:  SCD's Start: 03/05/22 2235  Level of Care: Level of care: Telemetry Medical Family Communication:  Disposition Plan:      Remains inpatient appropriate: PT recommended inpatient rehab, wife requested SNF as it will be difficult to have care 24/7 after discharge from CIR.  Requested to [facility] to touch base with wife  Procedures:  MRI brain, CTA  head and neck 2D echo  Consultants:   Neurology Cardiology  Antimicrobials: Tamiflu started on 12/24 Medications  aspirin EC  325 mg Oral Daily   carvedilol  3.125 mg Oral BID WC   Chlorhexidine Gluconate Cloth  6 each Topical Daily   clopidogrel  75 mg Oral Daily   dapagliflozin propanediol  10 mg Oral Daily   insulin aspart  0-9 Units Subcutaneous TID WC   insulin aspart  3 Units Subcutaneous TID WC   insulin glargine-yfgn  8 Units Subcutaneous Daily   lipase/protease/amylase  72,000 Units Oral TID WC   losartan  12.5 mg Oral Daily   mirabegron ER  25 mg Oral Daily   oseltamivir  30 mg Oral BID   pantoprazole  40 mg Oral Daily      Subjective:   William Barron was seen and examined today.  No acute complaints, afebrile, no chest pain, acute shortness of breath, coughing.  Overall improving Objective:   Vitals:   03/08/22 1805 03/08/22 2008 03/09/22 0315 03/09/22 0902  BP: (!) 150/83 (!) 123/93 117/72 (!) 120/57  Pulse: 85 (!) 48 79 78  Resp: _0 Temp: (!) 97.5 F (36.4 C) 98.2 F (36.8 C) (!) 97.3 F (36.3 C) 98.1 F (36.7 C)  TempSrc: Oral Axillary Oral Axillary  SpO2: 100% (!) 88% 97% 95%  Weight:      Height:        Intake/Output Summary (Last 24 hours) at 03/09/2022 1154 Last data filed at 03/08/2022 2000 Gross per 24 hour  Intake --  Output 300 ml  Net -300 ml     Wt Readings from Last 3 Encounters:  03/08/22 64.7 kg  01/28/22 66.3 kg  01/14/22 67.2 kg   Physical Exam General: Alert and oriented x 3, NAD, Cardiovascular: S1 S2 clear, RRR.  Respiratory: Bilateral scattered rhonchi, no wheezing Gastrointestinal: Soft, nontender, nondistended, NBS Ext: no pedal edema bilaterally Neuro: no new deficits Psych: Normal affect     Data Reviewed:  I have personally reviewed following labs    CBC Lab Results  Component Value Date   WBC 7.0 03/07/2022   RBC 3.88 (L) 03/07/2022   HGB 9.3 (L) 03/07/2022   HCT 29.7 (L) 03/07/2022    MCV 76.5 (L) 03/07/2022   MCH 24.0 (L) 03/07/2022   PLT 334 03/07/2022   MCHC 31.3 03/07/2022   RDW 17.9 (H) 03/07/2022   LYMPHSABS 1.1 03/05/2022   MONOABS 0.5 03/05/2022   EOSABS 0.1 03/05/2022   BASOSABS 0.1 18/84/1660     Last metabolic panel Lab Results  Component Value Date   NA 135 03/09/2022   K 4.0 03/09/2022   CL 99 03/09/2022   CO2 23 03/09/2022   BUN 24 (H) 03/09/2022   CREATININE 0.99 03/09/2022  GLUCOSE 156 (H) 03/09/2022   GFRNONAA >60 03/09/2022   GFRAA >60 07/13/2018   CALCIUM 8.6 (L) 03/09/2022   PHOS 3.1 09/10/2021   PROT 5.6 (L) 03/06/2022   ALBUMIN 2.7 (L) 03/06/2022   LABGLOB 2.7 06/21/2018   AGRATIO 1.3 06/21/2018   BILITOT 0.5 03/06/2022   ALKPHOS 564 (H) 03/06/2022   AST 28 03/06/2022   ALT 26 03/06/2022   ANIONGAP 13 03/09/2022    CBG (last 3)  Recent Labs    03/08/22 1808 03/08/22 2115 03/09/22 0624  GLUCAP 120* 131* 187*      Coagulation Profile: Recent Labs  Lab 03/05/22 1632  INR 1.2     Radiology Studies: I have personally reviewed the imaging studies  No results found.     Estill Cotta M.D. [facility] 03/09/2022, 11:54 AM  Available via Epic secure chat 7am-7pm After 7 pm, please refer to night coverage provider listed on amion.

## 2022-03-09 NOTE — Progress Notes (Signed)
Physical Therapy Treatment Patient Details Name: William Barron MRN: [id number] DOB: 1945/05/03 Today's Date: 03/09/2022   History of Present Illness 76 year old male admitted 12/22  with right-sided headache and blurred vision +Acute right PCA stroke. PMHX:  amyloidosis, multiple myeloma, history of prostate cancer, history of pancreatic cancer status post Whipple, dyslipidemia, diabetes.    PT Comments    RN reported pt had ?vagal episode on [facility] 12/25 and required 3 person assist to get back to bed. Patient diagnosed with flu since last seen and was notably weaker in general today. Able to stand at EOB x 5 reps with marching in place with RW the last 2 reps. Participated in seated exercises.   Recommendations for follow up therapy are one component of a multi-disciplinary discharge planning process, led by the attending physician.  Recommendations may be updated based on patient status, additional functional criteria and insurance authorization.  Follow Up Recommendations  Acute inpatient rehab (3hours/day)     Assistance Recommended at Discharge Intermittent Supervision/Assistance  Patient can return home with the following A lot of help with walking and/or transfers;A little help with bathing/dressing/bathroom;Assistance with cooking/housework;Help with stairs or ramp for entrance;Assist for transportation   Equipment Recommendations  None recommended by PT    Recommendations for Other Services       Precautions / Restrictions Precautions Precautions: Fall Precaution Comments: vision impaired Required Braces or Orthoses: Other Brace Other Brace: RLE AFO and shoes Restrictions Weight Bearing Restrictions: No     Mobility  Bed Mobility Overal bed mobility: Needs Assistance Bed Mobility: Supine to Sit, Sit to Supine     Supine to sit: Min guard Sit to supine: Supervision   General bed mobility comments: HOB elevated, pt able to come up no rail and no assist;  guarding for safety as he scooted forward in sitting;    Transfers Overall transfer level: Needs assistance Equipment used: Rolling walker (2 wheels) Transfers: Sit to/from Stand Sit to Stand: Min assist           General transfer comment: x5 reps with cues each time for hand placement both to stand and to sit    Ambulation/Gait Ambulation/Gait assistance: Min assist   Assistive device: Rolling walker (2 wheels)       Pre-gait activities: pt's LEs very wobbly on first standing and had to return to sit; each subsequent transfer, pt slightly more steady and by 4th and 5th reps able to march in place with low foot clearance     Stairs             Wheelchair Mobility    Modified Rankin (Stroke Patients Only) Modified Rankin (Stroke Patients Only) Pre-Morbid Rankin Score: Moderate disability Modified Rankin: Moderately severe disability     Balance Overall balance assessment: Needs assistance Sitting-balance support: No upper extremity supported, Feet supported Sitting balance-Leahy Scale: Poor   Postural control: Left lateral lean Standing balance support: During functional activity, Bilateral upper extremity supported Standing balance-Leahy Scale: Poor                              Cognition Arousal/Alertness: Awake/alert Behavior During Therapy: WFL for tasks assessed/performed Overall Cognitive Status: No family/caregiver present to determine baseline cognitive functioning Area of Impairment: Memory                     Memory: Decreased short-term memory  Exercises General Exercises - Lower Extremity Long Arc Quad: AROM, Both, 15 reps Toe Raises: AROM, Both, 10 reps Heel Raises: AROM, Both, 10 reps    General Comments        Pertinent Vitals/Pain Pain Assessment Pain Assessment: No/denies pain    Home Living                          Prior Function            PT Goals (current goals  can now be found in the care plan section) Acute Rehab PT Goals Patient Stated Goal: Get well PT Goal Formulation: With patient/family Time For Goal Achievement: 03/20/22 Potential to Achieve Goals: Good Progress towards PT goals: Not progressing toward goals - comment (patient now has the flu and has declined since last seen)    Frequency    Min 4X/week      PT Plan      Co-evaluation              AM-PAC PT "6 Clicks" Mobility   Outcome Measure  Help needed turning from your back to your side while in a flat bed without using bedrails?: A Little Help needed moving from lying on your back to sitting on the side of a flat bed without using bedrails?: A Little Help needed moving to and from a bed to a chair (including a wheelchair)?: A Little Help needed standing up from a chair using your arms (e.g., wheelchair or bedside chair)?: A Little Help needed to walk in hospital room?: Total Help needed climbing 3-5 steps with a railing? : Total 6 Click Score: 14    End of Session Equipment Utilized During Treatment: Gait belt Activity Tolerance: Patient tolerated treatment well Patient left: in bed;with call bell/phone within reach;with bed alarm set   PT Visit Diagnosis: Unsteadiness on feet (R26.81);Other abnormalities of gait and mobility (R26.89);Muscle weakness (generalized) (M62.81);History of falling (Z91.81);Difficulty in walking, not elsewhere classified (R26.2);Other symptoms and signs involving the nervous system (R29.898)     Time: 9774-1423 PT Time Calculation (min) (ACUTE ONLY): 36 min  Charges:  $Gait Training: 8-22 mins $Therapeutic Exercise: 8-22 mins                      Arby Barrette, PT Acute Rehabilitation Services  Office [phone]    Rexanne Mano 03/09/2022, 4:03 PM

## 2022-03-09 NOTE — Progress Notes (Addendum)
Inpatient Rehab Admissions Coordinator:    I spoke with pt. Regarding potential CIR admit. He confirms that wife cannot provide 24/7 support at d/c. I cannot offer a bed in this case. Pt. Will need short tern rehab elsewhere and prefers [facility].   Addendum: Spoke with pt.'s wife and son, now reconsidering ability to provide 24/7. I will follow until they make a decision.   Clemens Catholic, [place], [place] Admissions Coordinator  [phone] ([place]) [phone] (office)

## 2022-03-09 NOTE — Progress Notes (Addendum)
STROKE TEAM PROGRESS NOTE   INTERVAL HISTORY Patient was evaluated in room today. Today he feels much better compared to yesterday, though still complains of generalized weakness, but no focal neurologic deficits. He says he was not able to ambulate with PT/OT. He is awaiting inpatient rehab transfer.  Collateral information obtained [name], patient's wife) Patient's wife had questions about patient's care, specifically about his stroke symptoms, medications, and rehab. I answered most of her questions, but deferred the topic of insurance approval and rehab admission to the CIR and insurance processing team. Wife says she will call her husband to get more details.  Vitals:   03/08/22 1252 03/08/22 1805 03/08/22 2008 03/09/22 0315  BP: 125/70 (!) 150/83 (!) 123/93 117/72  Pulse: 83 85 (!) 48 79  Resp: _0 Temp: 98 F (36.7 C) (!) 97.5 F (36.4 C) 98.2 F (36.8 C) (!) 97.3 F (36.3 C)  TempSrc: Axillary Oral Axillary Oral  SpO2: 96% 100% (!) 88% 97%  Weight:      Height:       CBC:  Recent Labs  Lab 03/05/22 1632 03/05/22 1725 03/07/22 0723  WBC 7.0  --  7.0  NEUTROABS 5.2  --   --   HGB 10.4* 11.9* 9.3*  HCT 34.1* 35.0* 29.7*  MCV 78.4*  --  76.5*  PLT 419*  --  275    Basic Metabolic Panel:  Recent Labs  Lab 03/07/22 0723 03/08/22 0303 03/09/22 0435  NA 137 136 135  K 3.4* 3.8 4.0  CL 107 101 99  CO2 _1 GLUCOSE 184* 143* 156*  BUN 10 14 24*  CREATININE 0.98 1.10 0.99  CALCIUM 8.5* 8.9 8.6*  MG 1.7  --   --     Lipid Panel:  Recent Labs  Lab 03/05/22 2356  CHOL 119  TRIG 75  HDL 38*  CHOLHDL 3.1  VLDL 15  LDLCALC 66    HgbA1c:  Recent Labs  Lab 03/08/22 0303  HGBA1C 6.5*   Urine Drug Screen:  Recent Labs  Lab 03/05/22 2356  LABOPIA POSITIVE*  COCAINSCRNUR NONE DETECTED  LABBENZ NONE DETECTED  AMPHETMU NONE DETECTED  THCU NONE DETECTED  LABBARB NONE DETECTED     Alcohol Level  Recent Labs  Lab 03/05/22 2356  ETH  <10     IMAGING past 24 hours No results found.  PHYSICAL EXAM General: well-appearing and in no acute distress HEENT: normocephalic and atraumatic Respiratory: normal respiratory effort and on RA Gastrointestinal: non-tender and non-distended Extremities: moving all extremities spontaneously  Mental Status: William Barron is alert; he is oriented to person, place, time, and situation. Speech was clear and fluent without evidence of aphasia. He was able to follow 3 step commands without difficulty.  Cranial Nerves: II:  Visual fields grossly intact III,IV, VI: no ptosis, extra-ocular motions  with notable R CN VI palsy , chronic V,VII: smile symmetric, facial light touch sensation intact bilaterally VIII: hearing improved due to having his cochlear implants XII: midline tongue extension without atrophy and without fasciculations  Motor: Right : Upper extremity   5/5 full power  Lower extremity   5/5 full power  Left: Upper extremity   5/5 full power Lower extremity   5/5 full power  Tone and bulk: normal tone throughout; no atrophy noted  Gait: not observed during encounter  ASSESSMENT/PLAN 76 year old male history of amyloidosis, multiple myeloma, history of prostate cancer, history of pancreatic cancer status post Whipple, dyslipidemia, diabetes  insulin requiring presents to the ER today with right-sided headache and blurred vision.  Last known well was yesterday evening 03/04/2022.  Patient states he woke up around 6:00 this morning.  He states that he had difficulty seeing out of his right eye.  He went to see an ophthalmologist who referred him to the ER. CT scan of the head in the ER demonstrated a acute/subacute right occipital stroke.  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  Stroke: Acute R PCA stroke, embolic pattern likely from occult afib vs. Cardiomyopathy with low EF CT showed right PCA infarct.   CTA head and neck right PCA distal  P3 occlusion, left VA origin stenosis.   MRI confirmed right PCA infarct.   Echo EF 30 to 35% LE venous Doppler no DVT.   Recommend loop recorder prior to discharge. LDL 66 HgbA1c 6.5 VTE prophylaxis - SCDs No antithrombotic prior to admission, now on aspirin 325 mg and clopidogrel 75 mg DAPT daily for 3 months given intracranial stenosis/occlusion, then aspirin 81 mg alone daily indefinitely Therapy recommendations:  CIR Disposition:  pending   Cardiomyopathy  EF 30 to 35% down from 55 to 60% in 11/2021  Cardiology on board Flu A could be part of the explanation Ischemia eval as outpt On coreg, losartian, farxiga and plan to add aldactone - GDMT per cardiology  Diabetes type II Uncontrolled Home meds:  insulin HgbA1c 6.5, goal < 7.0 CBGs SSI  Hyperlipidemia Home meds none LDL 66, goal less than 70 No statin needed given LDL goal and advanced age  Flu A Positive Flu A H1 2009 Droplet precaution Management per primary team  Other Stroke Risk Factors Advanced Age >/= 43  Other Active Problems per primary team Multiple myeloma Prostate cancer Chronic right CN VI palsy, on eye patch.   Hospital day # 3    ATTENDING NOTE: I reviewed above note and agree with the assessment and plan. Pt was seen and examined.   RN at the bedside.  Patient lying in bed, having breakfast.  He is more awake alert than yesterday, less lethargic.  However, still complaining of whole body weakness.  FluA infection likely the cause.  Cardiology on board for cardiomyopathy, currently on GDMT.  Neurological exam essentially nonfocal except chronic right CN VI palsy.  On eyepatch.  continue DAPT for 3 months and then aspirin alone given intracranial stenosis/occlusion. Pending loop recorder prior to discharge to CIR.   For detailed assessment and plan, please refer to above/below as I have made changes wherever appropriate.   Neurology will sign off. Please call with questions. Pt will follow up  with Dr. Felecia Shelling at [facility] in about 4 weeks. Thanks for the consult.   Rosalin Hawking, MD PhD Stroke Neurology 03/09/2022 7:01 PM   To contact Stroke Continuity provider, please refer to [email or web address] After hours, contact General Neurology

## 2022-03-09 NOTE — Plan of Care (Signed)
  Problem: Education: Goal: Knowledge of disease or condition will improve Outcome: Not Progressing Goal: Knowledge of patient specific risk factors will improve William Barron N/A or DELETE if not current risk factor) Outcome: Not Progressing

## 2022-03-10 ENCOUNTER — Ambulatory Visit: Payer: Medicare Other | Admitting: Physical Therapy

## 2022-03-10 DIAGNOSIS — I5021 Acute systolic (congestive) heart failure: Secondary | ICD-10-CM | POA: Diagnosis not present

## 2022-03-10 DIAGNOSIS — Z90410 Acquired total absence of pancreas: Secondary | ICD-10-CM | POA: Diagnosis not present

## 2022-03-10 DIAGNOSIS — E8589 Other amyloidosis: Secondary | ICD-10-CM | POA: Diagnosis not present

## 2022-03-10 DIAGNOSIS — I639 Cerebral infarction, unspecified: Secondary | ICD-10-CM | POA: Diagnosis not present

## 2022-03-10 LAB — GLUCOSE, CAPILLARY
Glucose-Capillary: 102 mg/dL — ABNORMAL HIGH (ref 70–99)
Glucose-Capillary: 130 mg/dL — ABNORMAL HIGH (ref 70–99)
Glucose-Capillary: 210 mg/dL — ABNORMAL HIGH (ref 70–99)
Glucose-Capillary: 117 mg/dL — ABNORMAL HIGH (ref 70–99)

## 2022-03-10 MED ORDER — SPIRONOLACTONE 12.5 MG HALF TABLET
12.5000 mg | ORAL_TABLET | Freq: Every day | ORAL | Status: DC
  Administered 2022-03-10 – 2022-03-21 (×11): 12.5 mg via ORAL
  Filled 2022-03-10 (×13): qty 1

## 2022-03-10 NOTE — Progress Notes (Signed)
[facility]                                                                              William Barron, is a 76 y.o. male, DOB - 1945-06-09, [id number] Admit date - 03/05/2022    Outpatient Primary MD for the patient is [name], MD  LOS - 4  days  Chief Complaint  Patient presents with   Headache       Brief summary   Patient is a 76 year old male with history of pancreatic CA status post Whipple, prostate CA, multiple myeloma, amyloidosis, dyslipidemia, DM, presented to ED with right-sided headache and blurred vision.  Last known well was in the evening of 03/04/2022.  He woke up on the morning of admission on 12/22 with difficulty seeing out of his right eye.  He went to see an ophthalmologist and was referred to ER CT head showed acute/subacute right occipital CVA Failed swallow evaluation Neurology was consulted, admitted for further workup..   Assessment & Plan    Principal Problem: Acute right PCA stroke (HCC) right CN VI palsy, chronic -Presented with visual issues, having difficulty seeing half of the clock.  CT head showed right PCA territory stroke. -MRI brain showed acute to early subacute right PCA territory infarct.  Associated mild petechial blood products without frank hemorrhagic transformation or significant residual mass effect.  Underlying moderate chronic microvascular ischemic disease -CTA head and neck showed acute or early subacute infarct in the right occipital lobe, suspected high-grade stenosis versus occlusion of the distal right PCA. -2D echo: EF of 35 to 40% with global hypokinesis, mild AS, mild pulmonary regurg, mild MR, mild AR (EF 50 to 55% on echo in 11/2021) -Per neuro, continue aspirin 325 mg and Plavix 75 mg DAPT for 3 months given intracranial stenosis/occlusion then aspirin 81 mg alone daily indefinitely  -Lipid panel showed LDL 66, at goal, no need of statin per neurology -Hemoglobin A1c 6.5 -Loop recorder per  neuro, timing per cardiology  Active Problems: New onset systolic CHF, EF 35 to 37% -2D echo showed EF of 35 to 40% with global hypokinesis (was 50 to 55% on echo in 11/2021) -Received Lasix 20 mg IV x 1 on 12/24 -BNP 1326.2, received Lasix 20 mg IV x 1 on 12/24, negative balance of 2.4 L -Continue Coreg, losartan 12.5 mg daily, Farxiga 10 mg daily  -Lasix as needed  Influenza A positive -COVID-19 negative, positive for influenza A - started on Tamiflu 30 mg twice daily on 12/24, day # 4 today  Hypokalemia -Resolved  GERD -Continue PPI  Diabetes mellitus type 2, IDDM HbA1c 6.5 Recent Labs    03/09/22 0624 03/09/22 1230 03/09/22 1710 03/09/22 2146 03/10/22 0705 03/10/22 1134  GLUCAP 187* 187* 182* 106* 102* 130*   -CBGs better, continue Semglee 10 units daily, NovoLog 3 units 3 times daily AC, SSI   History of pancreatic CA status post Whipple procedure -Continue Creon 72,000 units 3 times daily with meals -Follows with [name]  History of AL amyloidosis ([place]),  Multiple myeloma ([place]) -Outpatient follow-up with his oncologist, [name] at [facility].  History of prostate  CA -Continue outpatient follow-up with urology, Dr. Jeffie Pollock, completed XRT on 06/02/2020    Lesion of parotid gland -MRI brain incidentally also showed 1.8 cm T2 hypointense lesion within the posterior aspect of the right parotid gland, indeterminate.  Recommended nonemergent outpatient ENT evaluation -CTA showed 1.6 cm partially calcified lesion in the posterior aspect of right parotid gland with multiple surrounding smaller lesion, could potentially represent primary parotid neoplasm benign or malignant or enlarged lymph nodes, recommended outpatient ENT evaluation  Code Status: Full code DVT Prophylaxis:  SCD's Start: 03/05/22 2235  Level of Care: Level of care: Telemetry Medical Family Communication: Discussed with patient Disposition Plan:      Remains inpatient appropriate: PT  recommended inpatient rehab.  TOC, CIR assisting and working with wife.  Procedures:  MRI brain, CTA head and neck 2D echo  Consultants:   Neurology Cardiology  Antimicrobials: Tamiflu started on 12/24 Medications  aspirin EC  325 mg Oral Daily   carvedilol  3.125 mg Oral BID WC   Chlorhexidine Gluconate Cloth  6 each Topical Daily   clopidogrel  75 mg Oral Daily   dapagliflozin propanediol  10 mg Oral Daily   insulin aspart  0-9 Units Subcutaneous TID WC   insulin aspart  3 Units Subcutaneous TID WC   insulin glargine-yfgn  10 Units Subcutaneous Daily   lipase/protease/amylase  72,000 Units Oral TID WC   losartan  12.5 mg Oral Daily   mirabegron ER  25 mg Oral Daily   oseltamivir  30 mg Oral BID   pantoprazole  40 mg Oral Daily      Subjective:   William Barron was seen and examined today.  No acute complaints per the patient, eating breakfast at the time of my examination.  Cough is improving, afebrile no chest pain.  No acute shortness of breath.    Objective:   Vitals:   03/09/22 2319 03/10/22 0404 03/10/22 0929 03/10/22 1137  BP: (!) 97/52 107/66 (!) 140/71 (!) 141/73  Pulse: 76 78 78 80  Resp: _0 Temp: 97.9 F (36.6 C) 98.5 F (36.9 C) 98.4 F (36.9 C) 98.7 F (37.1 C)  TempSrc: Oral Oral Oral Oral  SpO2: 90% 91% 99% 93%  Weight:      Height:        Intake/Output Summary (Last 24 hours) at 03/10/2022 1219 Last data filed at 03/10/2022 0500 Gross per 24 hour  Intake --  Output 500 ml  Net -500 ml     Wt Readings from Last 3 Encounters:  03/08/22 64.7 kg  01/28/22 66.3 kg  01/14/22 67.2 kg   MPhysical Exam General: Alert and oriented x 3, NAD Cardiovascular: S1 S2 clear, RRR.  Respiratory: Fairly CTA B Gastrointestinal: Soft, nontender, nondistended, NBS Ext: no pedal edema bilaterally Neuro: no new deficits Psych: Normal affect    Data Reviewed:  I have personally reviewed following labs    CBC Lab Results  Component  Value Date   WBC 7.0 03/07/2022   RBC 3.88 (L) 03/07/2022   HGB 9.3 (L) 03/07/2022   HCT 29.7 (L) 03/07/2022   MCV 76.5 (L) 03/07/2022   MCH 24.0 (L) 03/07/2022   PLT 334 03/07/2022   MCHC 31.3 03/07/2022   RDW 17.9 (H) 03/07/2022   LYMPHSABS 1.1 03/05/2022   MONOABS 0.5 03/05/2022   EOSABS 0.1 03/05/2022   BASOSABS 0.1 53/64/6803     Last metabolic panel Lab Results  Component Value Date   NA 135 03/09/2022  K 4.0 03/09/2022   CL 99 03/09/2022   CO2 23 03/09/2022   BUN 24 (H) 03/09/2022   CREATININE 0.99 03/09/2022   GLUCOSE 156 (H) 03/09/2022   GFRNONAA >60 03/09/2022   GFRAA >60 07/13/2018   CALCIUM 8.6 (L) 03/09/2022   PHOS 3.1 09/10/2021   PROT 5.6 (L) 03/06/2022   ALBUMIN 2.7 (L) 03/06/2022   LABGLOB 2.7 06/21/2018   AGRATIO 1.3 06/21/2018   BILITOT 0.5 03/06/2022   ALKPHOS 564 (H) 03/06/2022   AST 28 03/06/2022   ALT 26 03/06/2022   ANIONGAP 13 03/09/2022    CBG (last 3)  Recent Labs    03/09/22 2146 03/10/22 0705 03/10/22 1134  GLUCAP 106* 102* 130*      Coagulation Profile: Recent Labs  Lab 03/05/22 1632  INR 1.2     Radiology Studies: I have personally reviewed the imaging studies  No results found.       M.D. [facility] 03/10/2022, 12:19 PM  Available via Epic secure chat 7am-7pm After 7 pm, please refer to night coverage provider listed on amion.    

## 2022-03-10 NOTE — Progress Notes (Signed)
Physical Therapy Treatment Patient Details Name: William Barron MRN: [id number] DOB: [birth year] Today's Date: 03/10/2022   History of Present Illness 76 year old male admitted 12/22  with right-sided headache and blurred vision +Acute right PCA stroke. PMHX:  amyloidosis, multiple myeloma, history of prostate cancer, history of pancreatic cancer status post Whipple, dyslipidemia, diabetes.    PT Comments    Pt greeted propped in bed on arrival and agreeable to session, however limited secondary to global fatigue and LE weakness. Pt able to complete multiple sit<>stand transfers during session and short bout of gait, with grossly min assist to steady and dense cues for posture and sequencing to side step toward HOB. Pt motivated for participation and improvement and current plan remains appropriate to address deficits and maximize functional independence and decrease caregiver burden. Pt continues to benefit from skilled PT services to progress toward functional mobility goals.     Recommendations for follow up therapy are one component of a multi-disciplinary discharge planning process, led by the attending physician.  Recommendations may be updated based on patient status, additional functional criteria and insurance authorization.  Follow Up Recommendations  Acute inpatient rehab (3hours/day)     Assistance Recommended at Discharge Intermittent Supervision/Assistance  Patient can return home with the following A lot of help with walking and/or transfers;A little help with bathing/dressing/bathroom;Assistance with cooking/housework;Help with stairs or ramp for entrance;Assist for transportation   Equipment Recommendations  None recommended by PT    Recommendations for Other Services       Precautions / Restrictions Precautions Precautions: Fall Precaution Comments: vision impaired Required Braces or Orthoses: Other Brace Other Brace: RLE AFO and shoes Restrictions Weight  Bearing Restrictions: No     Mobility  Bed Mobility Overal bed mobility: Needs Assistance Bed Mobility: Supine to Sit, Sit to Supine     Supine to sit: Min guard Sit to supine: Supervision   General bed mobility comments: HOB elevated, pt able to come up no rail and no assist; guarding for safety as he scooted forward in sitting;    Transfers Overall transfer level: Needs assistance Equipment used: Rolling walker (2 wheels) Transfers: Sit to/from Stand Sit to Stand: Min assist           General transfer comment: x5 reps with cues each time for hand placement both to stand and to sit    Ambulation/Gait Ambulation/Gait assistance: Mod assist Gait Distance (Feet): 3 Feet (+ 3' sidestepping to [facility]) Assistive device: Rolling walker (2 wheels) Gait Pattern/deviations: Step-through pattern, Narrow base of support, Leaning posteriorly, Decreased stride length, Shuffle Gait velocity: slow   Pre-gait activities: pt's LEs very wobbly on first standing and had to return to sit; each subsequent transfer, pt slightly more steady by last reps able to march in place x20 General Gait Details: Min assist for posterior LOB, hands on assist at all times. Trunk flexed, cues for upright posture and to widen BOS. Shuffled steps, 3' forward 3' backward   Stairs             Wheelchair Mobility    Modified Rankin (Stroke Patients Only) Modified Rankin (Stroke Patients Only) Pre-Morbid Rankin Score: Moderate disability Modified Rankin: Moderately severe disability     Balance Overall balance assessment: Needs assistance Sitting-balance support: No upper extremity supported, Feet supported Sitting balance-Leahy Scale: Poor   Postural control: Left lateral lean Standing balance support: During functional activity, Bilateral upper extremity supported Standing balance-Leahy Scale: Poor  Cognition Arousal/Alertness: Awake/alert Behavior  During Therapy: WFL for tasks assessed/performed Overall Cognitive Status: No family/caregiver present to determine baseline cognitive functioning Area of Impairment: Memory                     Memory: Decreased short-term memory                  Exercises General Exercises - Lower Extremity Hip Flexion/Marching: AROM, 10 reps, 20 reps, Seated, Standing    General Comments        Pertinent Vitals/Pain Pain Assessment Pain Assessment: No/denies pain Pain Intervention(s): Monitored during session    Home Living Family/patient expects to be discharged to:: Unsure Living Arrangements: Spouse/significant other Available Help at Discharge: Family;Available 24 hours/day Type of Home: House Home Access: Stairs to enter Entrance Stairs-Rails: Right Entrance Stairs-Number of Steps: 4 Alternate Level Stairs-Number of Steps: has chair lift Home Layout: Two level;1/2 bath on main level Home Equipment: Advice worker (2 wheels);Rollator (4 wheels);BSC/3in1;Wheelchair - manual Additional Comments: Using RW to ambulate at home. Wife cooks meals.    Prior Function            PT Goals (current goals can now be found in the care plan section) Acute Rehab PT Goals Patient Stated Goal: get stronger PT Goal Formulation: With patient/family Time For Goal Achievement: 03/20/22 Potential to Achieve Goals: Good    Frequency    Min 4X/week      PT Plan      Co-evaluation              AM-PAC PT "6 Clicks" Mobility   Outcome Measure  Help needed turning from your back to your side while in a flat bed without using bedrails?: A Little Help needed moving from lying on your back to sitting on the side of a flat bed without using bedrails?: A Little Help needed moving to and from a bed to a chair (including a wheelchair)?: A Little Help needed standing up from a chair using your arms (e.g., wheelchair or bedside chair)?: A Little Help needed to walk in  hospital room?: Total Help needed climbing 3-5 steps with a railing? : Total 6 Click Score: 14    End of Session Equipment Utilized During Treatment: Gait belt Activity Tolerance: Patient tolerated treatment well;Patient limited by fatigue Patient left: in bed;with call bell/phone within reach;with bed alarm set;with family/visitor present Nurse Communication: Mobility status PT Visit Diagnosis: Unsteadiness on feet (R26.81);Other abnormalities of gait and mobility (R26.89);Muscle weakness (generalized) (M62.81);History of falling (Z91.81);Difficulty in walking, not elsewhere classified (R26.2);Other symptoms and signs involving the nervous system (G28.366)     Time: 2947-6546 PT Time Calculation (min) (ACUTE ONLY): 29 min  Charges:  $Gait Training: 8-22 mins $Therapeutic Exercise: 8-22 mins                    Amond Speranza R. PTA Acute Rehabilitation Services Office: [place] 03/10/2022, 2:59 PM

## 2022-03-10 NOTE — Progress Notes (Signed)
   Heart Failure Stewardship Pharmacist Progress Note   PCP: [name], MD PCP-Cardiologist: Minus Breeding, MD    HPI:  76 yo M with PMH of CAD, chronic LE edema, HLD, T2DM, and multiple myeloma / AL amyloidosis.   Presented to the ED on 12/22 with stroke like symptoms. CT confirmed right PCA territory stroke. Brain MRI showed acute to early subacute right PCA territory infarct with associated mild petechial blood products without frank hemorrhagic transformation or significant regional mass effect. MRI also showed a hypointense lesion within the posterior aspect of the right parotid gland. ECHO 12/23 showed LVEF 30-35% (was 55-60% in Sept 2023), global hypokinesis, mild LVH, G2DD, and RV normal. CXR 12/24 showed some evidence of pulmonary congestion. Received lasix without much improvement. Thought to be more related with flu and pneumonia.    Current HF Medications: Beta Blocker: carvedilol 3.125 mg BID ACE/ARB/ARNI: losartan 12.5 mg daily SGLT2i: Farxiga 10 mg daily  Prior to admission HF Medications: None  Pertinent Lab Values: No labs 12/27 Serum creatinine 0.99, BUN 24, Potassium 4.0, Sodium 135, BNP 1326.2, Magnesium 1.7, A1c 6.5   Vital Signs: Weight: 142 lbs (admission weight: 140 lbs) Blood pressure: 100/60s  Heart rate: 70-80s  I/O: -0.3L yesterday; net -2.5L  Medication Assistance / Insurance Benefits Check: Does the patient have prescription insurance?  Yes Type of insurance plan: Medicare + BCBS supplement  Outpatient Pharmacy:  Prior to admission outpatient pharmacy: [facility] Is the patient willing to use [facility] at discharge? Yes, if not going to SNF or CIR Is the patient willing to transition their outpatient pharmacy to utilize a [facility] outpatient pharmacy?   Pending    Assessment: 1. Acute systolic CHF (LVEF 79-39%). Ischemic evaluation to be completed after he recovers from his stroke. NYHA class II symptoms. - Appears euvolemic on exam,  no indication for diuretics - Continue carvedilol 3.125 mg BID - Continue losartan 12.5 mg daily - Consider adding spironolactone 12.5 mg daily - Continue Farxiga 10 mg daily   Plan: 1) Medication changes recommended at this time: - Add spironolactone 12.5 mg daily  2) Patient assistance: - None pending - Possible SNF at discharge  3)  Education  - To be completed prior to discharge  Kerby Nora, PharmD, BCPS Heart Failure Cytogeneticist Phone [phone]

## 2022-03-10 NOTE — Progress Notes (Signed)
Cardiology Progress Note  Patient ID: William Barron MRN: [id number] DOB: [birth year] Date of Encounter: 03/10/2022  Primary Cardiologist: Minus Breeding, MD  Subjective   Chief Complaint: SOB  HPI: Still with significant shortness of breath and cough.  No signs of worsening congestive heart failure.  ROS:  All other ROS reviewed and negative. Pertinent positives noted in the HPI.     Inpatient Medications  Scheduled Meds:  aspirin EC  325 mg Oral Daily   carvedilol  3.125 mg Oral BID WC   Chlorhexidine Gluconate Cloth  6 each Topical Daily   clopidogrel  75 mg Oral Daily   dapagliflozin propanediol  10 mg Oral Daily   insulin aspart  0-9 Units Subcutaneous TID WC   insulin aspart  3 Units Subcutaneous TID WC   insulin glargine-yfgn  10 Units Subcutaneous Daily   lipase/protease/amylase  72,000 Units Oral TID WC   losartan  12.5 mg Oral Daily   mirabegron ER  25 mg Oral Daily   oseltamivir  30 mg Oral BID   pantoprazole  40 mg Oral Daily   spironolactone  12.5 mg Oral Daily   Continuous Infusions:  PRN Meds: acetaminophen **OR** acetaminophen (TYLENOL) oral liquid 160 mg/5 mL **OR** acetaminophen, ipratropium-albuterol   Vital Signs   Vitals:   03/09/22 2319 03/10/22 0404 03/10/22 0929 03/10/22 1137  BP: (!) 97/52 107/66 (!) 140/71 (!) 141/73  Pulse: 76 78 78 80  Resp: '16 16 16 16  '$ Temp: 97.9 F (36.6 C) 98.5 F (36.9 C) 98.4 F (36.9 C) 98.7 F (37.1 C)  TempSrc: Oral Oral Oral Oral  SpO2: 90% 91% 99% 93%  Weight:      Height:        Intake/Output Summary (Last 24 hours) at 03/10/2022 1346 Last data filed at 03/10/2022 0500 Gross per 24 hour  Intake --  Output 500 ml  Net -500 ml      03/08/2022    5:00 AM 03/07/2022    6:00 AM 03/05/2022    4:19 PM  Last 3 Weights  Weight (lbs) 142 lb 10.2 oz 140 lb 3.4 oz 140 lb  Weight (kg) 64.7 kg 63.6 kg 63.504 kg      Telemetry  Overnight telemetry shows sinus rhythm in the 70s, which I personally  reviewed.    Physical Exam   Vitals:   03/09/22 2319 03/10/22 0404 03/10/22 0929 03/10/22 1137  BP: (!) 97/52 107/66 (!) 140/71 (!) 141/73  Pulse: 76 78 78 80  Resp: '16 16 16 16  '$ Temp: 97.9 F (36.6 C) 98.5 F (36.9 C) 98.4 F (36.9 C) 98.7 F (37.1 C)  TempSrc: Oral Oral Oral Oral  SpO2: 90% 91% 99% 93%  Weight:      Height:        Intake/Output Summary (Last 24 hours) at 03/10/2022 1346 Last data filed at 03/10/2022 0500 Gross per 24 hour  Intake --  Output 500 ml  Net -500 ml       03/08/2022    5:00 AM 03/07/2022    6:00 AM 03/05/2022    4:19 PM  Last 3 Weights  Weight (lbs) 142 lb 10.2 oz 140 lb 3.4 oz 140 lb  Weight (kg) 64.7 kg 63.6 kg 63.504 kg    Body mass index is 23.74 kg/m.  General: Well nourished, well developed, in no acute distress Head: Atraumatic, normal size  Eyes: PEERLA, EOMI  Neck: Supple, no JVD Endocrine: No thryomegaly Cardiac: Normal S1, S2; RRR; no murmurs,  rubs, or gallops Lungs: Clear to auscultation bilaterally, no wheezing, rhonchi or rales  Abd: Soft, nontender, no hepatomegaly  Ext: No edema, pulses 2+ Musculoskeletal: No deformities, BUE and BLE strength normal and equal Skin: Warm and dry, no rashes   Neuro: Alert and oriented to person, place, time, and situation, CNII-XII grossly intact, no focal deficits  Psych: Normal mood and affect   Labs  High Sensitivity Troponin:  No results for input(s): "TROPONINIHS" in the last 720 hours.   Cardiac EnzymesNo results for input(s): "TROPONINI" in the last 168 hours. No results for input(s): "TROPIPOC" in the last 168 hours.  Chemistry Recent Labs  Lab 03/05/22 1632 03/05/22 1725 03/06/22 0615 03/07/22 0723 03/08/22 0303 03/09/22 0435  NA 139   < > 138 137 136 135  K 3.2*   < > 3.3* 3.4* 3.8 4.0  CL 106   < > 108 107 101 99  CO2 23  --  '22 22 23 23  '$ GLUCOSE 128*   < > 63* 184* 143* 156*  BUN 13   < > '10 10 14 '$ 24*  CREATININE 0.98   < > 0.89 0.98 1.10 0.99  CALCIUM 9.1  --   8.5* 8.5* 8.9 8.6*  PROT 7.1  --  5.6*  --   --   --   ALBUMIN 3.5  --  2.7*  --   --   --   AST 39  --  28  --   --   --   ALT 33  --  26  --   --   --   ALKPHOS 727*  --  564*  --   --   --   BILITOT 0.4  --  0.5  --   --   --   GFRNONAA >60  --  >60 >60 >60 >60  ANIONGAP 10  --  '8 8 12 13   '$ < > = values in this interval not displayed.    Hematology Recent Labs  Lab 03/05/22 1632 03/05/22 1725 03/07/22 0723  WBC 7.0  --  7.0  RBC 4.35  --  3.88*  HGB 10.4* 11.9* 9.3*  HCT 34.1* 35.0* 29.7*  MCV 78.4*  --  76.5*  MCH 23.9*  --  24.0*  MCHC 30.5  --  31.3  RDW 18.2*  --  17.9*  PLT 419*  --  334   BNP Recent Labs  Lab 03/07/22 1237  BNP 1,326.2*    DDimer No results for input(s): "DDIMER" in the last 168 hours.   Radiology  No results found.  Cardiac Studies  TTE 03/06/2022  1. Left ventricular ejection fraction, by estimation, is 30 to 35%. Left  ventricular ejection fraction by 3D volume is 37 %. The left ventricle has  moderately decreased function. The left ventricle demonstrates global  hypokinesis. The left ventricular  internal cavity size was mildly dilated. There is mild left ventricular  hypertrophy. Left ventricular diastolic parameters are consistent with  Grade II diastolic dysfunction (pseudonormalization).   2. Right ventricular systolic function is normal. The right ventricular  size is normal. Tricuspid regurgitation signal is inadequate for assessing  PA pressure.   3. Left atrial size was mildly dilated.   4. A small pericardial effusion is present. The pericardial effusion is  circumferential.   5. The mitral valve is grossly normal. Mild mitral valve regurgitation.  No evidence of mitral stenosis.   6. The aortic valve is abnormal. There is moderate calcification of  the  aortic valve. Aortic valve regurgitation is mild. Aortic valve  sclerosis/calcification is present. Mild aortic valve stenosis noted on  prior echocardiogram. Reduced  gradient on  today's exam may be secondary to low flow state, with stroke volume index  of 32 mL/m2.   7. The inferior vena cava is dilated in size with >50% respiratory  variability, suggesting right atrial pressure of 8 mmHg.   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 Cardiology consulted due to newly reduced ejection fraction.   Assessment & Plan   # New onset systolic heart failure, EF 35-40% -Etiology unclear.  He with influenza as well as right PCA stroke. -Known history of AL amyloidosis but no cardiac involvement.  No increased wall thickness on my review of his echo. -In the setting of current stroke and influenza A I have recommended medical management for now.  Continue carvedilol 3.125 mg twice daily.  Continue losartan 12.5 mg daily.  I have added Aldactone 12.5 mg daily.  He is on Farxiga 10 mg daily. -He can be on Lasix 20 mg daily as needed.  No signs of volume overload on examination today.  Examination is more consistent with pneumonia. -Okay for stable follow-up with Dr. Percival Spanish as an outpatient.  # Acute right PCA stroke # Chronic cerebral microvascular disease -Admitted with stroke.  Plans for loop recorder.  This will be coordinated with electrophysiology. -Continue aspirin and Plavix per neurology.  # Nonobstructive CAD -On aspirin Plavix as above.  No chest pain.  Not on a statin as the patient has declined this in the past.  # Influenza A -Per hospital medicine team.  [facility] will sign off.   Medication Recommendations: Medical management as above  Other recommendations (labs, testing, etc): None Follow up as an outpatient: We will arrange outpatient follow-up in 4 to 6 weeks  For questions or updates, please  contact [place] Please consult [email or web address] for contact info under   Signed, Lake Bells T. Audie Box, MD, [place]  03/10/2022 1:46 PM

## 2022-03-11 ENCOUNTER — Encounter (HOSPITAL_COMMUNITY): Payer: Self-pay | Admitting: Interventional Cardiology

## 2022-03-11 ENCOUNTER — Encounter (HOSPITAL_COMMUNITY)
Admission: EM | Disposition: A | Discharge status: Rehabilitation Facility | Payer: Self-pay | Source: Home / Self Care | Attending: Internal Medicine

## 2022-03-11 ENCOUNTER — Inpatient Hospital Stay (HOSPITAL_COMMUNITY): Payer: No Typology Code available for payment source

## 2022-03-11 DIAGNOSIS — I469 Cardiac arrest, cause unspecified: Secondary | ICD-10-CM

## 2022-03-11 DIAGNOSIS — I5021 Acute systolic (congestive) heart failure: Secondary | ICD-10-CM | POA: Diagnosis not present

## 2022-03-11 DIAGNOSIS — I472 Ventricular tachycardia, unspecified: Secondary | ICD-10-CM

## 2022-03-11 DIAGNOSIS — I639 Cerebral infarction, unspecified: Secondary | ICD-10-CM | POA: Diagnosis not present

## 2022-03-11 HISTORY — PX: RIGHT/LEFT HEART CATH AND CORONARY ANGIOGRAPHY: CATH118266

## 2022-03-11 LAB — BASIC METABOLIC PANEL
Anion gap: 10 (ref 5–15)
Anion gap: 12 (ref 5–15)
BUN: 31 mg/dL — ABNORMAL HIGH (ref 8–23)
BUN: 33 mg/dL — ABNORMAL HIGH (ref 8–23)
CO2: 24 mmol/L (ref 22–32)
CO2: 21 mmol/L — ABNORMAL LOW (ref 22–32)
Calcium: 8.3 mg/dL — ABNORMAL LOW (ref 8.9–10.3)
Calcium: 8.2 mg/dL — ABNORMAL LOW (ref 8.9–10.3)
Chloride: 103 mmol/L (ref 98–111)
Chloride: 104 mmol/L (ref 98–111)
Creatinine, Ser: 1.32 mg/dL — ABNORMAL HIGH (ref 0.61–1.24)
Creatinine, Ser: 1.37 mg/dL — ABNORMAL HIGH (ref 0.61–1.24)
GFR, Estimated: 55 mL/min — ABNORMAL LOW (ref >60)
GFR, Estimated: 53 mL/min — ABNORMAL LOW (ref >60)
Glucose, Bld: 130 mg/dL — ABNORMAL HIGH (ref 70–99)
Glucose, Bld: 175 mg/dL — ABNORMAL HIGH (ref 70–99)
Potassium: 4 mmol/L (ref 3.5–5.1)
Potassium: 3.9 mmol/L (ref 3.5–5.1)
Sodium: 137 mmol/L (ref 135–145)
Sodium: 137 mmol/L (ref 135–145)

## 2022-03-11 LAB — POCT I-STAT 7, (LYTES, BLD GAS, ICA,H+H)
Acid-base deficit: 2 mmol/L (ref 0.0–2.0)
Bicarbonate: 21.5 mmol/L (ref 20.0–28.0)
Calcium, Ion: 1.14 mmol/L — ABNORMAL LOW (ref 1.15–1.40)
HCT: 33 % — ABNORMAL LOW (ref 39.0–52.0)
Hemoglobin: 11.2 g/dL — ABNORMAL LOW (ref 13.0–17.0)
O2 Saturation: 93 %
Potassium: 3.9 mmol/L (ref 3.5–5.1)
Sodium: 136 mmol/L (ref 135–145)
TCO2: 22 mmol/L (ref 22–32)
pCO2 arterial: 30.8 mmHg — ABNORMAL LOW (ref 32–48)
pH, Arterial: 7.451 — ABNORMAL HIGH (ref 7.35–7.45)
pO2, Arterial: 64 mmHg — ABNORMAL LOW (ref 83–108)

## 2022-03-11 LAB — POCT I-STAT EG7
Acid-base deficit: 1 mmol/L (ref 0.0–2.0)
Acid-base deficit: 1 mmol/L (ref 0.0–2.0)
Bicarbonate: 23.6 mmol/L (ref 20.0–28.0)
Bicarbonate: 23.6 mmol/L (ref 20.0–28.0)
Calcium, Ion: 1.15 mmol/L (ref 1.15–1.40)
Calcium, Ion: 1.12 mmol/L — ABNORMAL LOW (ref 1.15–1.40)
HCT: 33 % — ABNORMAL LOW (ref 39.0–52.0)
HCT: 33 % — ABNORMAL LOW (ref 39.0–52.0)
Hemoglobin: 11.2 g/dL — ABNORMAL LOW (ref 13.0–17.0)
Hemoglobin: 11.2 g/dL — ABNORMAL LOW (ref 13.0–17.0)
O2 Saturation: 46 %
O2 Saturation: 45 %
Potassium: 3.9 mmol/L (ref 3.5–5.1)
Potassium: 3.8 mmol/L (ref 3.5–5.1)
Sodium: 137 mmol/L (ref 135–145)
Sodium: 138 mmol/L (ref 135–145)
TCO2: 25 mmol/L (ref 22–32)
TCO2: 25 mmol/L (ref 22–32)
pCO2, Ven: 39.2 mmHg — ABNORMAL LOW (ref 44–60)
pCO2, Ven: 38.7 mmHg — ABNORMAL LOW (ref 44–60)
pH, Ven: 7.388 (ref 7.25–7.43)
pH, Ven: 7.393 (ref 7.25–7.43)
pO2, Ven: 25 mmHg — CL (ref 32–45)
pO2, Ven: 25 mmHg — CL (ref 32–45)

## 2022-03-11 LAB — TROPONIN I (HIGH SENSITIVITY)
Troponin I (High Sensitivity): 152 ng/L (ref <18)
Troponin I (High Sensitivity): 144 ng/L (ref <18)

## 2022-03-11 LAB — CBC
HCT: 35.2 % — ABNORMAL LOW (ref 39.0–52.0)
Hemoglobin: 11.1 g/dL — ABNORMAL LOW (ref 13.0–17.0)
MCH: 24 pg — ABNORMAL LOW (ref 26.0–34.0)
MCHC: 31.5 g/dL (ref 30.0–36.0)
MCV: 76.2 fL — ABNORMAL LOW (ref 80.0–100.0)
Platelets: 316 10*3/uL (ref 150–400)
RBC: 4.62 MIL/uL (ref 4.22–5.81)
RDW: 18.6 % — ABNORMAL HIGH (ref 11.5–15.5)
WBC: 3.7 10*3/uL — ABNORMAL LOW (ref 4.0–10.5)
nRBC: 0 % (ref 0.0–0.2)

## 2022-03-11 LAB — ECHOCARDIOGRAM LIMITED
Area-P 1/2: 2.36 cm2
Calc EF: 42.9 %
Height: 65 in
Single Plane A2C EF: 37.4 %
Single Plane A4C EF: 49.5 %
Weight: 2282.2 oz

## 2022-03-11 LAB — GLUCOSE, CAPILLARY
Glucose-Capillary: 173 mg/dL — ABNORMAL HIGH (ref 70–99)
Glucose-Capillary: 145 mg/dL — ABNORMAL HIGH (ref 70–99)
Glucose-Capillary: 126 mg/dL — ABNORMAL HIGH (ref 70–99)
Glucose-Capillary: 161 mg/dL — ABNORMAL HIGH (ref 70–99)

## 2022-03-11 LAB — MAGNESIUM: Magnesium: 2.3 mg/dL (ref 1.7–2.4)

## 2022-03-11 LAB — LACTIC ACID, PLASMA
Lactic Acid, Venous: 1.9 mmol/L (ref 0.5–1.9)
Lactic Acid, Venous: 1.8 mmol/L (ref 0.5–1.9)

## 2022-03-11 SURGERY — RIGHT/LEFT HEART CATH AND CORONARY ANGIOGRAPHY
Anesthesia: LOCAL

## 2022-03-11 SURGERY — Surgical Case
Anesthesia: *Unknown

## 2022-03-11 MED ORDER — SODIUM CHLORIDE 0.9% FLUSH
3.0000 mL | INTRAVENOUS | Status: DC | PRN
  Administered 2022-03-13: 3 mL via INTRAVENOUS

## 2022-03-11 MED ORDER — LABETALOL HCL 5 MG/ML IV SOLN: 10.0000 mg | INTRAVENOUS | Status: AC | PRN

## 2022-03-11 MED ORDER — SODIUM CHLORIDE 0.9% FLUSH
3.0000 mL | Freq: Two times a day (BID) | INTRAVENOUS | Status: DC
  Administered 2022-03-12 – 2022-03-22 (×17): 3 mL via INTRAVENOUS

## 2022-03-11 MED ORDER — FENTANYL CITRATE (PF) 100 MCG/2ML IJ SOLN
INTRAMUSCULAR | Status: DC | PRN
  Administered 2022-03-11: 25 ug via INTRAVENOUS

## 2022-03-11 MED ORDER — AMIODARONE HCL IN DEXTROSE 360-4.14 MG/200ML-% IV SOLN
60.0000 mg/h | INTRAVENOUS | Status: DC
  Administered 2022-03-11: 60 mg/h via INTRAVENOUS
  Filled 2022-03-11 (×3): qty 200

## 2022-03-11 MED ORDER — AMIODARONE IV BOLUS ONLY 150 MG/100ML
INTRAVENOUS | Status: AC
  Filled 2022-03-11: qty 100

## 2022-03-11 MED ORDER — HEPARIN (PORCINE) IN NACL 1000-0.9 UT/500ML-% IV SOLN
INTRAVENOUS | Status: DC | PRN
  Administered 2022-03-11 (×2): 500 mL

## 2022-03-11 MED ORDER — SODIUM CHLORIDE 0.9 % IV SOLN
INTRAVENOUS | Status: AC | PRN
  Administered 2022-03-11: 250 mL via INTRAVENOUS

## 2022-03-11 MED ORDER — HYDRALAZINE HCL 20 MG/ML IJ SOLN: 10.0000 mg | INTRAMUSCULAR | Status: AC | PRN

## 2022-03-11 MED ORDER — HEPARIN SODIUM (PORCINE) 5000 UNIT/ML IJ SOLN
5000.0000 [IU] | Freq: Three times a day (TID) | INTRAMUSCULAR | Status: DC
  Administered 2022-03-11 – 2022-03-22 (×33): 5000 [IU] via SUBCUTANEOUS
  Filled 2022-03-11 (×33): qty 1

## 2022-03-11 MED ORDER — VERAPAMIL HCL 2.5 MG/ML IV SOLN
INTRAVENOUS | Status: DC | PRN
  Administered 2022-03-11 (×2): 10 mL via INTRA_ARTERIAL

## 2022-03-11 MED ORDER — LIDOCAINE HCL (PF) 1 % IJ SOLN
INTRAMUSCULAR | Status: DC | PRN
  Administered 2022-03-11 (×2): 2 mL

## 2022-03-11 MED ORDER — AMIODARONE HCL IN DEXTROSE 360-4.14 MG/200ML-% IV SOLN
30.0000 mg/h | INTRAVENOUS | Status: DC
  Administered 2022-03-11 – 2022-03-12 (×3): 30 mg/h via INTRAVENOUS
  Filled 2022-03-11 (×3): qty 200

## 2022-03-11 MED ORDER — IOHEXOL 350 MG/ML SOLN
INTRAVENOUS | Status: DC | PRN
  Administered 2022-03-11: 20 mL

## 2022-03-11 MED ORDER — SODIUM CHLORIDE 0.9 % IV SOLN: 250.0000 mL | INTRAVENOUS | Status: DC | PRN

## 2022-03-11 MED ORDER — ASPIRIN 81 MG PO CHEW
81.0000 mg | CHEWABLE_TABLET | ORAL | Status: AC
  Administered 2022-03-11: 81 mg via ORAL
  Filled 2022-03-11: qty 1

## 2022-03-11 MED ORDER — SODIUM CHLORIDE 0.9% FLUSH: 3.0000 mL | Freq: Two times a day (BID) | INTRAVENOUS | Status: DC

## 2022-03-11 MED ORDER — MIDAZOLAM HCL 2 MG/2ML IJ SOLN
INTRAMUSCULAR | Status: DC | PRN
  Administered 2022-03-11: 1 mg via INTRAVENOUS

## 2022-03-11 MED ORDER — AMIODARONE LOAD VIA INFUSION
150.0000 mg | Freq: Once | INTRAVENOUS | Status: AC
  Administered 2022-03-11: 150 mg via INTRAVENOUS
  Filled 2022-03-11: qty 83.34

## 2022-03-11 MED ORDER — SODIUM CHLORIDE 0.9 % IV SOLN: INTRAVENOUS | Status: DC

## 2022-03-11 MED ORDER — ACETAMINOPHEN 325 MG PO TABS: 650.0000 mg | ORAL_TABLET | ORAL | Status: DC | PRN

## 2022-03-11 MED ORDER — ASPIRIN 325 MG PO TBEC
325.0000 mg | DELAYED_RELEASE_TABLET | Freq: Every day | ORAL | Status: DC
  Administered 2022-03-13 – 2022-03-21 (×9): 325 mg via ORAL
  Filled 2022-03-11 (×10): qty 1

## 2022-03-11 MED ORDER — SODIUM CHLORIDE 0.9% FLUSH: 3.0000 mL | INTRAVENOUS | Status: DC | PRN

## 2022-03-11 MED ORDER — HEPARIN SODIUM (PORCINE) 1000 UNIT/ML IJ SOLN
INTRAMUSCULAR | Status: DC | PRN
  Administered 2022-03-11: 3000 [IU] via INTRAVENOUS

## 2022-03-11 SURGICAL SUPPLY — 13 items
CATH 5FR JL3.5 JR4 ANG PIG MP (CATHETERS) IMPLANT
CATH SWAN GANZ 7F STRAIGHT (CATHETERS) IMPLANT
DEVICE RAD TR BAND REGULAR (VASCULAR PRODUCTS) IMPLANT
ELECT DEFIB PAD ADLT CADENCE (PAD) IMPLANT
GLIDESHEATH SLEND SS 6F .021 (SHEATH) IMPLANT
GLIDESHEATH SLENDER 7FR .021G (SHEATH) IMPLANT
GUIDEWIRE INQWIRE 1.5J.035X260 (WIRE) IMPLANT
INQWIRE 1.5J .035X260CM (WIRE) ×1
KIT HEART LEFT (KITS) ×1 IMPLANT
PACK CARDIAC CATHETERIZATION (CUSTOM PROCEDURE TRAY) ×1 IMPLANT
SHEATH PROBE COVER 6X72 (BAG) IMPLANT
TRANSDUCER W/STOPCOCK (MISCELLANEOUS) ×1 IMPLANT
TUBING CIL FLEX 10 FLL-RA (TUBING) ×1 IMPLANT

## 2022-03-11 NOTE — Progress Notes (Signed)
[facility] IP rehab admissions - Note insurance has changed to [place].  We are not planning to admit to inpatient rehab.  Wife has not been able to commit to providing 24/7 supervision for patient after discharge.  Recommend pursuit of SNF placement.  Patient went to [facility] previously and does prefer that facility.  Call me for questions.  [phone]

## 2022-03-11 NOTE — Progress Notes (Signed)
   Heart Failure Stewardship Pharmacist Progress Note   PCP: [name], MD PCP-Cardiologist: Minus Breeding, MD    HPI:  76 yo M with PMH of CAD, chronic LE edema, HLD, T2DM, and multiple myeloma / AL amyloidosis.   Presented to the ED on 12/22 with stroke like symptoms. CT confirmed right PCA territory stroke. Brain MRI showed acute to early subacute right PCA territory infarct with associated mild petechial blood products without frank hemorrhagic transformation or significant regional mass effect. MRI also showed a hypointense lesion within the posterior aspect of the right parotid gland. ECHO 12/23 showed LVEF 30-35% (was 55-60% in Sept 2023), global hypokinesis, mild LVH, G2DD, and RV normal. CXR 12/24 showed some evidence of pulmonary congestion. Received lasix without much improvement. Thought to be more related with flu and pneumonia.   Code blue called 12/28 AM. VT arrest. Only received compressions before regaining ROSC. Started on IV amiodarone. Repeat ECHO 12/28 still with EF 30-35%. R/LHC scheduled for today. After cath, EP consult for ICD consideration.    Current HF Medications: Beta Blocker: carvedilol 3.125 mg BID ACE/ARB/ARNI: losartan 12.5 mg daily MRA: spironolactone 12.5 mg daily SGLT2i: Farxiga 10 mg daily  Prior to admission HF Medications: None  Pertinent Lab Values: Serum creatinine 1.37, BUN 33, Potassium 3.9, Sodium 137, BNP 1326.2, Magnesium 2.3, A1c 6.5   Vital Signs: Weight: 128 lbs (admission weight: 140 lbs) Blood pressure: 100/60s  Heart rate: 60-70s  I/O: -0.5L yesterday; net -3L  Medication Assistance / Insurance Benefits Check: Does the patient have prescription insurance?  Yes Type of insurance plan: Medicare + BCBS supplement  Outpatient Pharmacy:  Prior to admission outpatient [facility] Is the patient willing to use [facility] at discharge? Yes, if not going to SNF or CIR Is the patient willing to transition their  outpatient pharmacy to utilize a [facility] outpatient pharmacy?   Pending    Assessment: 1. Acute systolic CHF (LVEF 93-81%). VT arrest on 12/28. Ischemic evaluation pending. NYHA class II symptoms. - Appears euvolemic on exam, no indication for diuretics - Continue carvedilol 3.125 mg BID - Continue losartan 12.5 mg daily - Continue spironolactone 12.5 mg daily - Continue Farxiga 10 mg daily   Plan: 1) Medication changes recommended at this time: - Continue current regimen  2) Patient assistance: - None pending - Possible SNF at discharge  3)  Education  - To be completed prior to discharge  Kerby Nora, PharmD, BCPS Heart Failure Cytogeneticist Phone [phone]

## 2022-03-11 NOTE — Progress Notes (Signed)
Cardiology Progress Note  Patient ID: William Barron MRN: [id number] DOB: Oct 10, 1945 Date of Encounter: 03/11/2022  Primary Cardiologist: Minus Breeding, MD  Subjective   Chief Complaint: Cardiac arrest this morning   HPI: VT/VF this AM. 1 minute CPR. No defib. Hemodynamically stable this AM. Needs LHC/RHC.   ROS:  All other ROS reviewed and negative. Pertinent positives noted in the HPI.     Inpatient Medications  Scheduled Meds:  aspirin EC  325 mg Oral Daily   carvedilol  3.125 mg Oral BID WC   Chlorhexidine Gluconate Cloth  6 each Topical Daily   clopidogrel  75 mg Oral Daily   dapagliflozin propanediol  10 mg Oral Daily   insulin aspart  0-9 Units Subcutaneous TID WC   insulin aspart  3 Units Subcutaneous TID WC   insulin glargine-yfgn  10 Units Subcutaneous Daily   lipase/protease/amylase  72,000 Units Oral TID WC   losartan  12.5 mg Oral Daily   mirabegron ER  25 mg Oral Daily   oseltamivir  30 mg Oral BID   pantoprazole  40 mg Oral Daily   spironolactone  12.5 mg Oral Daily   Continuous Infusions:  amiodarone 60 mg/hr (03/11/22 0752)   Followed by   amiodarone     amiodarone     PRN Meds: acetaminophen **OR** acetaminophen (TYLENOL) oral liquid 160 mg/5 mL **OR** acetaminophen, amiodarone, ipratropium-albuterol   Vital Signs   Vitals:   03/11/22 0703 03/11/22 0735 03/11/22 0745 03/11/22 0800  BP: 118/67 105/65 104/61 (!) 94/59  Pulse: 72 69 70 67  Resp: 18 (!) 25 (!) 24 17  Temp: 98.1 F (36.7 C)     TempSrc: Oral     SpO2: 96% 100% 95% 95%  Weight:      Height:        Intake/Output Summary (Last 24 hours) at 03/11/2022 0815 Last data filed at 03/11/2022 0411 Gross per 24 hour  Intake 240 ml  Output 775 ml  Net -535 ml      03/08/2022    5:00 AM 03/07/2022    6:00 AM 03/05/2022    4:19 PM  Last 3 Weights  Weight (lbs) 142 lb 10.2 oz 140 lb 3.4 oz 140 lb  Weight (kg) 64.7 kg 63.6 kg 63.504 kg      Telemetry  Overnight telemetry  shows SR 70s, which I personally reviewed.   ECG  The most recent ECG shows sinus rhythm heart 69, no acute ischemic changes or evidence of infarction, which I personally reviewed.   Physical Exam   Vitals:   03/11/22 0703 03/11/22 0735 03/11/22 0745 03/11/22 0800  BP: 118/67 105/65 104/61 (!) 94/59  Pulse: 72 69 70 67  Resp: 18 (!) 25 (!) 24 17  Temp: 98.1 F (36.7 C)     TempSrc: Oral     SpO2: 96% 100% 95% 95%  Weight:      Height:        Intake/Output Summary (Last 24 hours) at 03/11/2022 0815 Last data filed at 03/11/2022 0411 Gross per 24 hour  Intake 240 ml  Output 775 ml  Net -535 ml       03/08/2022    5:00 AM 03/07/2022    6:00 AM 03/05/2022    4:19 PM  Last 3 Weights  Weight (lbs) 142 lb 10.2 oz 140 lb 3.4 oz 140 lb  Weight (kg) 64.7 kg 63.6 kg 63.504 kg    Body mass index is 23.74 kg/m.  General: Well  nourished, well developed, in no acute distress Head: Atraumatic, normal size  Eyes: PEERLA, EOMI  Neck: Supple, no JVD Endocrine: No thryomegaly Cardiac: Normal S1, S2; RRR; no murmurs, rubs, or gallops Lungs: Diminished breath sounds bilaterally Abd: Soft, nontender, no hepatomegaly  Ext: No edema, pulses 2+ Musculoskeletal: No deformities, BUE and BLE strength normal and equal Skin: Warm and dry, no rashes   Neuro: Alert and oriented to person, place, time, and situation, CNII-XII grossly intact, no focal deficits  Psych: Normal mood and affect   Labs  High Sensitivity Troponin:   Recent Labs  Lab 03/11/22 0539  TROPONINIHS 152*     Cardiac EnzymesNo results for input(s): "TROPONINI" in the last 168 hours. No results for input(s): "TROPIPOC" in the last 168 hours.  Chemistry Recent Labs  Lab 03/05/22 1632 03/05/22 1725 03/06/22 0615 03/07/22 0723 03/09/22 0435 03/11/22 0215 03/11/22 0539  NA 139   < > 138   < > 135 137 137  K 3.2*   < > 3.3*   < > 4.0 4.0 3.9  CL 106   < > 108   < > 99 103 104  CO2 23  --  22   < > 23 24 21*  GLUCOSE  128*   < > 63*   < > 156* 130* 175*  BUN 13   < > 10   < > 24* 31* 33*  CREATININE 0.98   < > 0.89   < > 0.99 1.32* 1.37*  CALCIUM 9.1  --  8.5*   < > 8.6* 8.3* 8.2*  PROT 7.1  --  5.6*  --   --   --   --   ALBUMIN 3.5  --  2.7*  --   --   --   --   AST 39  --  28  --   --   --   --   ALT 33  --  26  --   --   --   --   ALKPHOS 727*  --  564*  --   --   --   --   BILITOT 0.4  --  0.5  --   --   --   --   GFRNONAA >60  --  >60   < > >60 55* 53*  ANIONGAP 10  --  8   < > '13 10 12   '$ < > = values in this interval not displayed.    Hematology Recent Labs  Lab 03/05/22 1632 03/05/22 1725 03/07/22 0723 03/11/22 0215  WBC 7.0  --  7.0 3.7*  RBC 4.35  --  3.88* 4.62  HGB 10.4* 11.9* 9.3* 11.1*  HCT 34.1* 35.0* 29.7* 35.2*  MCV 78.4*  --  76.5* 76.2*  MCH 23.9*  --  24.0* 24.0*  MCHC 30.5  --  31.3 31.5  RDW 18.2*  --  17.9* 18.6*  PLT 419*  --  334 316   BNP Recent Labs  Lab 03/07/22 1237  BNP 1,326.2*    DDimer No results for input(s): "DDIMER" in the last 168 hours.   Radiology  DG CHEST PORT 1 VIEW  Result Date: 03/11/2022 CLINICAL DATA:  76 year old male with history of hypoxia. EXAM: PORTABLE CHEST 1 VIEW COMPARISON:  Chest x-ray 03/07/2022. FINDINGS: Diffuse interstitial prominence and widespread peribronchial cuffing throughout the lungs bilaterally (right greater than left). Mass-like opacity in the perihilar aspect of the right lung with surrounding ill-defined airspace consolidation. No pleural effusions.  No pneumothorax. Heart size is normal. IMPRESSION: 1. The appearance the chest is concerning for bronchitis, likely with developing bronchopneumonia in the right middle and/or lower lobes. Mass-like opacity in the perihilar aspect of the right lung is presumably part of this airspace consolidation given the rapid development compared to the recent prior study. However, underlying neoplasm is difficult to exclude. Followup PA and lateral chest X-ray is recommended in 3-4  weeks following trial of antibiotic therapy to ensure resolution and exclude underlying malignancy. Electronically Signed   By: Vinnie Langton M.D.   On: 03/11/2022 07:09    Cardiac Studies  TTE 03/06/2022  1. Left ventricular ejection fraction, by estimation, is 30 to 35%. Left  ventricular ejection fraction by 3D volume is 37 %. The left ventricle has  moderately decreased function. The left ventricle demonstrates global  hypokinesis. The left ventricular  internal cavity size was mildly dilated. There is mild left ventricular  hypertrophy. Left ventricular diastolic parameters are consistent with  Grade II diastolic dysfunction (pseudonormalization).   2. Right ventricular systolic function is normal. The right ventricular  size is normal. Tricuspid regurgitation signal is inadequate for assessing  PA pressure.   3. Left atrial size was mildly dilated.   4. A small pericardial effusion is present. The pericardial effusion is  circumferential.   5. The mitral valve is grossly normal. Mild mitral valve regurgitation.  No evidence of mitral stenosis.   6. The aortic valve is abnormal. There is moderate calcification of the  aortic valve. Aortic valve regurgitation is mild. Aortic valve  sclerosis/calcification is present. Mild aortic valve stenosis noted on  prior echocardiogram. Reduced gradient on  today's exam may be secondary to low flow state, with stroke volume index  of 32 mL/m2.   7. The inferior vena cava is dilated in size with >50% respiratory  variability, suggesting right atrial pressure of 8 mmHg.   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 Cardiology consulted due to newly reduced ejection fraction.    Assessment & Plan   # VT/VF  arrest -Went into ventricular tachycardia versus polymorphic VT this morning around 4:44 AM.  Received 1 minute of chest compressions.  He was pulseless.  He did not require defibrillation.  ROSC was achieved within 1 minute. -Hemodynamically stable. -Unclear etiology.  Lactic acid is normal.  Electrolytes are normal.  Kidney function is stable.  He has been admitted with stroke but is recovering.  Also complicated by influenza A.  He also has a new onset cardiomyopathy so clearly the combination of influenza and congestive heart failure could have resulted in ventricular arrhythmias. -I have recommended a left and right heart catheterization today to exclude obstructive CAD.  Left heart cath in 2017 with nonobstructive disease.  We had planned for this as an outpatient but given current events we will proceed today with this.  NPO.  Risk and benefits explained.  I discussed this with his wife as well by phone.  They are both willing to proceed. -For now we will continue amiodarone drip.  Troponin is minimally elevated.  No need for heparin.  He reports no chest pain or trouble breathing.  He is completely hemodynamically stable. -EP will see the patient after the heart catheterization today.  Suspect he will end up needing an ICD for secondary prevention unless  we find a reversible cause. -Repeat stat limited echo as well.  Shared Decision Making/Informed Consent The risks [stroke (1 in 1000), death (1 in 1000), kidney failure [usually temporary] (1 in 500), bleeding (1 in 200), allergic reaction [possibly serious] (1 in 200)], benefits (diagnostic support and management of coronary artery disease) and alternatives of a cardiac catheterization were discussed in detail with Mr. Joynt and he is willing to proceed.   # New onset systolic heart failure, EF 35 to 40% -Etiology unclear.  Now with VT VF arrest.  Plan for left and right heart catheterization today.  Initially plan to do this as an  outpatient given current admission for stroke as well as influenza A.  However given recent events we will proceed today.  He has been cleared for left heart catheterization by neurology. -Will continue current medical management which includes carvedilol 3.25 mg twice daily, losartan 12.5 mg daily, Aldactone 12.5 mg daily, Farxiga 10 mg daily. -He was on Lasix as needed.  Right heart catheterization will let us know if he is still volume overloaded but he does not appear this way clinically.  # Acute right PCA stroke # Chronic cerebral microvascular disease -Admitted with stroke.  Symptoms of vision loss were the main issue.  Plan was for loop recorder but he may end up needing an ICD. -Continue aspirin Plavix per neurology. -Neurology is okay to move forward left heart catheterization today.  # Influenza A # Right lower lobe infiltrate on chest x-ray -Suspect he may have aspirated based on the change in his chest x-ray.  Antibiotics per hospital medicine team.  # Pancreatic cancer status post Whipple # AL amyloidosis/Myeloma  # GI amyloidosis  -He underwent Whipple procedure in June of this year for IPMN.   -He also has history of AL amyloidosis on chemotherapy.  He is never had cardiac involvement from what I can tell.  His echo was inconsistent with an infiltrative cardiomyopathy. -Cardiac MRI negative at [facility] for amyloid infiltration.  This was performed on 09/04/2020. -He is currently on chemotherapy regimen per recent review of [name] records from 03/03/2022.  No mention of prognosis.  This may be important information if considering ICD.  I will defer this to the electrophysiology team.  CRITICAL CARE Performed by: Lake Bells T O'Neal  Total critical care time: 45 minutes. Critical care time was exclusive of separately billable procedures and treating other patients. Critical care was necessary to treat or prevent imminent or life-threatening deterioration. Critical care was time spent  personally by me on the following activities: development of treatment plan with patient and/or surrogate as well as nursing, discussions with consultants, evaluation of patient's response to treatment, examination of patient, obtaining history from patient or surrogate, ordering and performing treatments and interventions, ordering and review of laboratory studies, ordering and review of radiographic studies, pulse oximetry and re-evaluation of patient's condition.   Signed, Addison Naegeli. Audie Box, MD, [place]  03/11/2022 8:31 AM      For questions or updates, please contact [place] Please consult [email or web address] for contact info under

## 2022-03-11 NOTE — Progress Notes (Signed)
  Amiodarone Drug - Drug Interaction Consult Note  Recommendations: Monitor vitals closely  Watch for addition of any QT prolonging meds Amiodarone is metabolized by the cytochrome P450 system and therefore has the potential to cause many drug interactions. Amiodarone has an average plasma half-life of 50 days (range 20 to 100 days).   There is potential for drug interactions to occur several weeks or months after stopping treatment and the onset of drug interactions may be slow after initiating amiodarone.   '[]'$  Statins: Increased risk of myopathy. Simvastatin- restrict dose to '20mg'$  daily. Other statins: counsel patients to report any muscle pain or weakness immediately.  '[]'$  Anticoagulants: Amiodarone can increase anticoagulant effect. Consider warfarin dose reduction. Patients should be monitored closely and the dose of anticoagulant altered accordingly, remembering that amiodarone levels take several weeks to stabilize.  '[]'$  Antiepileptics: Amiodarone can increase plasma concentration of phenytoin, the dose should be reduced. Note that small changes in phenytoin dose can result in large changes in levels. Monitor patient and counsel on signs of toxicity.  '[x]'$  Beta blockers: increased risk of bradycardia, AV block and myocardial depression. Sotalol - avoid concomitant use.  '[]'$   Calcium channel blockers (diltiazem and verapamil): increased risk of bradycardia, AV block and myocardial depression.  '[]'$   Cyclosporine: Amiodarone increases levels of cyclosporine. Reduced dose of cyclosporine is recommended.  '[]'$  Digoxin dose should be halved when amiodarone is started.  '[]'$  Diuretics: increased risk of cardiotoxicity if hypokalemia occurs.  '[]'$  Oral hypoglycemic agents (glyburide, glipizide, glimepiride): increased risk of hypoglycemia. Patient's glucose levels should be monitored closely when initiating amiodarone therapy.   '[]'$  Drugs that prolong the QT interval:  Torsades de pointes risk may be  increased with concurrent use - avoid if possible.  Monitor QTc, also keep magnesium/potassium WNL if concurrent therapy can't be avoided.  Antibiotics: e.g. fluoroquinolones, erythromycin.  Antiarrhythmics: e.g. quinidine, procainamide, disopyramide, sotalol.  Antipsychotics: e.g. phenothiazines, haloperidol.   Lithium, tricyclic antidepressants, and methadone. Thank You,  Narda Bonds  03/11/2022 5:27 AM

## 2022-03-11 NOTE — Progress Notes (Signed)
Chaplain responded to Code Blue. Patient was responded to treatment. No family present. Chaplain offered silent prayer. Rev. Tamsen Snider Pager [phone]

## 2022-03-11 NOTE — Progress Notes (Signed)
Code blue was called after the patient was found unresponsive and pulseless around 0445.  ACLS protocol was underway, lasting about 2 minutes with return in spontaneous circulation.  Discussed with cardiology.  Recommended Amiodarone drip and higher level of care.  CCM consulted for possible transfer to ICU.

## 2022-03-11 NOTE — Progress Notes (Signed)
OT Cancellation Note  Patient Details Name: [name] MRN: [id number] DOB: 02-20-1946   Cancelled Treatment:    Reason Eval/Treat Not Completed: Other (comment) (Code Blue early this AM. Will plan to see tomorrow as pt medically appropriate.)  Elder Cyphers, OTR/L [facility] Acute Rehabilitation Office: [phone]    Magnus Ivan 03/11/2022, 7:44 AM

## 2022-03-11 NOTE — Progress Notes (Signed)
Pt arrived back to room from cath lab. TR band with 12cc air on right wrist. Pt educated on arm restrictions. Vitals being monitored frequently, per protocol.

## 2022-03-11 NOTE — Consult Note (Signed)
NAME:  William Barron, MRN:  [id number], DOB:  Aug 22, 1945, LOS: 5 ADMISSION DATE:  03/05/2022 CONSULTATION DATE:  03/11/2022 REFERRING MD:  [place] Crane - TR[facility] CHIEF COMPLAINT:  VT arrest   History of Present Illness:  76 year old man who presented to [facility] 12/22 for R-sided headache and blurred vision. PMHx significant for nonobstructive CAD (LHC 2017), HLD, T2DM, amyloidosis, multiple myeloma, pancreatic CA (s/p Whipple 07/2021 with jejunostomy, since removed), prostate CA, GERD, hearing loss.  Initially presented to [facility] ED 12/22 for R-sided headache and blurred vision. LKW 12/21PM. Seen by ophtho who recommended presentation to ED. CT Head 12/22 demonstrated acute vs subacute R occipital (PCA) stroke. MRI with early subacute R PCA territory infarct. Neurology was consulted with [facility] admitting.  On 12/23, BLE US Dopplers negative for DVT. Echo was completed demonstrating new reduced EF 30-35% (previously 55-60% 11/2021) with global hypokinesis and G2DD, small pericardial effusion, mild AS. Cardiology was consulted with recommendation for medical management and DAPT; patient was working toward CIR admission versus SNF. While awaiting CIR, patient had persistent cough for which RVP was obtained; positive for Flu A and Tamiflu started.   On 12/28 early AM, patient was noted to be in VT on cardiac monitoring. Found pulseless and unresponsive; Code Blue called and CPR initiated x 2 minutes with ROSC. Patient was responsive and readily following commands after ROSC. Amiodarone gtt initiated.  PCCM consulted post-code.  Pertinent Medical History:   Past Medical History:  Diagnosis Date   Amyloidosis ([place])    Diabetes mellitus without complication ([place])    Feeding tube dysfunction, initial encounter 08/12/2021   GERD (gastroesophageal reflux disease)    Hearing loss    Has hearing aids   History of kidney stones    Hyperlipidemia    Jejunostomy tube present ([place])    Multiple myeloma ([place])    and  amylodosis    Nephrolithiasis    Occasional tremors    Pancreatic insufficiency    Pneumonia    Prostate cancer ([place])    Prostatitis    acute and chronic   Skin cancer    Significant Hospital Events: Including procedures, antibiotic start and stop dates in addition to other pertinent events   12/28 - Early AM Code Blue for pulseless VT, found unresponsive. CPR x 2 minutes with ROSC. Awake and following commands post-code. PCCM consulted.  Interim History / Subjective:  PCCM consulted post-code for possible ICU transfer  Objective:  Blood pressure (!) 101/53, pulse 72, temperature 97.6 F (36.4 C), temperature source Oral, resp. rate 18, height _0  (1.651 m), weight 64.7 kg, SpO2 97 %.        Intake/Output Summary (Last 24 hours) at 03/11/2022 0558 Last data filed at 03/11/2022 0411 Gross per 24 hour  Intake 240 ml  Output 775 ml  Net -535 ml   Filed Weights   03/05/22 1619 03/07/22 0600 03/08/22 0500  Weight: 63.5 kg 63.6 kg 64.7 kg   Physical Examination: General: Acutely ill-appearing elderly man in NAD. HEENT: [place]/AT, anicteric sclera, PERRL, moist mucous membranes. Neuro: Awake, oriented x 4. Responds to verbal stimuli. Following commands consistently. Generalized weakness. CV: RRR, no m/g/r. PULM: Breathing even and unlabored on 5LNC. Lung fields diminished bilaterally. GI: Soft, nontender, nondistended. Normoactive bowel sounds. Extremities: No significant LE edema noted. Skin: Warm/dry, no rashes.  Resolved Hospital Problem List:    Assessment & Plan:  Brief pulseless VT cardiac arrest New HFrEF R occipital/PCA territory stroke Amyloidosis Multiple myeloma Pancreatic CA s/p Whipple (07/2021)  Prostate CA  PCCM consulted post-brief VT cardiac arrest. Fortunately, arrest was quite brief (CPR x 2 min with ROSC) and patient did not require intubation. He is currently awake, responsive and following commands. He does have a new O2 requirement post-arrest, but  also has recent diagnosis of Flu A and supportive care/supplemental O2 is sufficient. Will repeat CXR to assess no new cardiopulmonary findings. Cardiology following and managing patient; recommended amiodarone gtt initiation and transfer to 4E for higher level of care. This seems appropriate at this time; do not feel patient requires ICU-level care at present.  - Transfer to 4E per prior plan - No ICU level care requirement at this juncture, we are happy to reconsult if patient's clinical condition changes/declines - Amiodarone per Cardiology - Loop recorder suggested by Neuro for r/o Afib, may be helpful to determine cause of VT - Optimize lytes for K > 4, Mg > 2 - Supplemental O2 to maintain sat > 90% - Continue Tamiflu course  Best Practice: (right click and "Reselect all SmartList Selections" daily)   Per Primary Team  Labs:  CBC: Recent Labs  Lab 03/05/22 1632 03/05/22 1725 03/07/22 0723 03/11/22 0215  WBC 7.0  --  7.0 3.7*  NEUTROABS 5.2  --   --   --   HGB 10.4* 11.9* 9.3* 11.1*  HCT 34.1* 35.0* 29.7* 35.2*  MCV 78.4*  --  76.5* 76.2*  PLT 419*  --  334 505   Basic Metabolic Panel: Recent Labs  Lab 03/06/22 0615 03/07/22 0723 03/08/22 0303 03/09/22 0435 03/11/22 0215  NA 138 137 136 135 137  K 3.3* 3.4* 3.8 4.0 4.0  CL 108 107 101 99 103  CO2 _0 GLUCOSE 63* 184* 143* 156* 130*  BUN _1 24* 31*  CREATININE 0.89 0.98 1.10 0.99 1.32*  CALCIUM 8.5* 8.5* 8.9 8.6* 8.3*  MG  --  1.7  --   --   --    GFR: Estimated Creatinine Clearance: 40.1 mL/min (A) (by C-G formula based on SCr of 1.32 mg/dL (H)). Recent Labs  Lab 03/05/22 1632 03/07/22 0723 03/07/22 1243 03/11/22 0215  PROCALCITON  --   --  <0.10  --   WBC 7.0 7.0  --  3.7*   Liver Function Tests: Recent Labs  Lab 03/05/22 1632 03/06/22 0615  AST 39 28  ALT 33 26  ALKPHOS 727* 564*  BILITOT 0.4 0.5  PROT 7.1 5.6*  ALBUMIN 3.5 2.7*   No results for input(s): "LIPASE",  "AMYLASE" in the last 168 hours. No results for input(s): "AMMONIA" in the last 168 hours.  ABG:    Component Value Date/Time   TCO2 23 03/05/2022 1725   Coagulation Profile: Recent Labs  Lab 03/05/22 1632  INR 1.2   Cardiac Enzymes: No results for input(s): "CKTOTAL", "CKMB", "CKMBINDEX", "TROPONINI" in the last 168 hours.  HbA1C: Hgb A1c MFr Bld  Date/Time Value Ref Range Status  03/08/2022 03:03 AM 6.5 (H) 4.8 - 5.6 % Final    Comment:    (NOTE)         Prediabetes: 5.7 - 6.4         Diabetes: >6.4         Glycemic control for adults with diabetes: <7.0   03/06/2022 06:15 AM 6.7 (H) 4.8 - 5.6 % Final    Comment:    (NOTE)         Prediabetes: 5.7 - 6.4  Diabetes: >6.4         Glycemic control for adults with diabetes: <7.0    CBG: Recent Labs  Lab 03/09/22 2146 03/10/22 0705 03/10/22 1134 03/10/22 1705 03/10/22 2127  GLUCAP 106* 102* 130* 210* 117*   Review of Systems:   Review of systems completed with pertinent positives/negatives outlined in above HPI.  Past Medical History:  He,  has a past medical history of Amyloidosis ([place]), Diabetes mellitus without complication ([place]), Feeding tube dysfunction, initial encounter (08/12/2021), GERD (gastroesophageal reflux disease), Hearing loss, History of kidney stones, Hyperlipidemia, Jejunostomy tube present ([place]), Multiple myeloma ([place]), Nephrolithiasis, Occasional tremors, Pancreatic insufficiency, Pneumonia, Prostate cancer ([place]), Prostatitis, and Skin cancer.   Surgical History:   Past Surgical History:  Procedure Laterality Date   APPENDECTOMY     CARDIAC CATHETERIZATION N/A 06/13/2015   Procedure: Left Heart Cath and Coronary Angiography;  Surgeon: Jettie Booze, MD;  Location: [facility];  Service: Cardiovascular;  Laterality: N/A;   COLONOSCOPY  04/07/2000   ELBOW SURGERY  03/15/2001   right   ESOPHAGOGASTRODUODENOSCOPY (EGD) WITH PROPOFOL N/A 09/07/2021   Procedure:  ESOPHAGOGASTRODUODENOSCOPY (EGD) WITH PROPOFOL;  Surgeon: Irene Shipper, MD;  Location: [facility];  Service: Gastroenterology;  Laterality: N/A;   EXTRACORPOREAL SHOCK WAVE LITHOTRIPSY Left 07/17/2018   Procedure: EXTRACORPOREAL SHOCK WAVE LITHOTRIPSY (ESWL);  Surgeon: [name], MD;  Location: [facility];  Service: Urology;  Laterality: Left;   EXTRACORPOREAL SHOCK WAVE LITHOTRIPSY Right 12/08/2020   Procedure: RIGHT EXTRACORPOREAL SHOCK WAVE LITHOTRIPSY (ESWL);  Surgeon: Raynelle Bring, MD;  Location: [facility];  Service: Urology;  Laterality: Right;   FOOT ARTHRODESIS Right 04/15/2021   Procedure: RIGHT SUBTALAR AND TALONAVICULAR FUSION;  Surgeon: Newt Minion, MD;  Location: [facility];  Service: Orthopedics;  Laterality: Right;   HERNIA REPAIR     IR MECH REMOV OBSTRUC MAT ANY COLON TUBE W/FLUORO  08/27/2021   IR [place] GASTRO/COLONIC TUBE PERCUT W/FLUORO  09/08/2021   ROTATOR CUFF REPAIR     WHIPPLE PROCEDURE  07/13/2021   Social History:   reports that he quit smoking about 45 years ago. His smoking use included cigarettes. He has a 60.00 pack-year smoking history. He has never used smokeless tobacco. He reports current alcohol use of about 3.0 standard drinks of alcohol per week. He reports that he does not use drugs.   Family History:  His family history includes CAD (age of onset: 72) in his brother; Cancer in his paternal grandmother; Pancreatic cancer in his father; Parkinsonism in his mother; Prostate cancer in his father. There is no history of Breast cancer or Colon cancer.   Allergies: No Known Allergies   Home Medications: Prior to Admission medications   Medication Sig Start Date End Date Taking? Authorizing Provider  Cyanocobalamin (B-12 PO) Take 1 tablet by mouth in the morning.   Yes [provider]  insulin aspart (NOVOLOG FLEXPEN) 100 UNIT/ML FlexPen Inject 8 Units into the skin 3 (three) times daily before meals.   Yes [provider]   lipase/protease/amylase (CREON) 36000 UNITS CPEP capsule Take 3 capsules (108,000 Units total) by mouth 3 (three) times daily with meals. May also take 1 capsule (36,000 Units total) as needed (with snacks). Patient taking differently: Take 2-3 capsules (72000-108000 units) by mouth 3 (three) times daily with meals. May also take 1 capsule (36,000 units) as needed (with snacks). 01/14/22  Yes Gatha Mayer, MD  pantoprazole sodium (PROTONIX) 40 mg Take 40 mg by mouth  daily. Patient taking differently: Take 40 mg by mouth in the morning. 01/14/22  Yes Gatha Mayer, MD  Testosterone 20.25 MG/ACT (1.62%) GEL Apply 4 Pump topically at bedtime.   Yes [provider]  Vibegron (GEMTESA) 75 MG TABS Take 75 mg by mouth in the morning.   Yes [provider]  BD PEN NEEDLE NANO U/F 32G X 4 MM MISC USE AS DIRECTED WITH INSULIN PEN THREE TIMES DAILY 01/27/22   Elayne Snare, MD  Continuous Blood Gluc Receiver (FREESTYLE LIBRE 2 READER) DEVI Use as instructed to check blood sugars 11/20/21   Elayne Snare, MD  Continuous Blood Gluc Sensor (FREESTYLE LIBRE 3 SENSOR) MISC Place 1 sensor on the skin every 14 days. Use to check glucose continuously 03/05/22   Elayne Snare, MD  insulin degludec (TRESIBA FLEXTOUCH) 100 UNIT/ML FlexTouch Pen Inject 15 Units into the skin daily. Patient taking differently: Inject 6 Units into the skin in the morning. 11/17/21   Elayne Snare, MD   Critical care time: N/A   Lestine Mount, PA-C [facility] 03/11/22 5:58 AM  Please see [email or web address] for pager details.  From 7A-7P if no response, please call [phone] After hours, please call ELink [phone]

## 2022-03-11 NOTE — Progress Notes (Addendum)
  Patient Name: William Barron   MRN: [id number]   Date of Birth/ Sex: [birth year] , male      Admission Date: 03/05/2022  Attending Provider: Mendel Corning, MD  Primary Diagnosis: Occipital stroke [facility])   Indication: Pt was in his usual state of health until this 4:44AM, when he was noted to be unresponsive. Code blue was subsequently called. At the time of arrival on scene, ACLS protocol was underway.   Technical Description:  - CPR performance duration:  1 minute  - Was defibrillation or cardioversion used? No   - Was external pacer placed? No  - Was patient intubated pre/post CPR? No   Medications Administered: Y = Yes; Blank = No Amiodarone    Atropine    Calcium    Epinephrine    Lidocaine    Magnesium    Norepinephrine    Phenylephrine    Sodium bicarbonate    Vasopressin     Post CPR evaluation:  - Final Status - Was patient successfully resuscitated ? Yes - What is current rhythm? Normal Sinus - What is current hemodynamic status? Hemodynamically stable  Miscellaneous Information:  - Labs sent, including: BMP, Magnesium, Troponin, Lactic Acid  - Primary team notified?  Yes  - Family Notified? Yes  - Additional notes/ transfer status: Had episode of VTAC lasting 3 minutes on tele prior to code blue. Spoke with Cardiology fellow, who recommended starting pt on amiodarone and moving to progressive level of care.      Starlyn Skeans, MD  03/11/2022, 5:08 AM

## 2022-03-11 NOTE — Progress Notes (Signed)
Pts heart rhythm was showing V-tach on tele monitor at around 0440. An RN went to check on the pt and found him to be unresponsive without pulse and not breathing. Chest compressions was started and before we could use the ambu bag, pt has achieved ROSC, regained consciousness, has pulse and breathing. Pt was able to follow commands and VS are within normal limits. Dr. [place] Crane was informed by the code team, new orders placed. Pt desats at 82% on room air, supplemental oxygenation started. Now needing mod flow O2 at 7L. MD aware. Transfer orders to higher level of care placed. Will continue to monitor.

## 2022-03-11 NOTE — Interval H&P Note (Signed)
Cath Lab Visit (complete for each Cath Lab visit)  Clinical Evaluation Leading to the Procedure:   ACS: Yes.    Non-ACS:    Anginal Classification: CCS IV  Anti-ischemic medical therapy: Minimal Therapy (1 class of medications)  Non-Invasive Test Results: No non-invasive testing performed  Prior CABG: No previous CABG   Decreased EF.  VF arrest   History and Physical Interval Note:  03/11/2022 11:36 AM  [name]  has presented today for surgery, with the diagnosis of Vt.  The various methods of treatment have been discussed with the patient and family. After consideration of risks, benefits and other options for treatment, the patient has consented to  Procedure(s): RIGHT/LEFT HEART CATH AND CORONARY ANGIOGRAPHY (N/A) as a surgical intervention.  The patient's history has been reviewed, patient examined, no change in status, stable for surgery.  I have reviewed the patient's chart and labs.  Questions were answered to the patient's satisfaction.     Larae Grooms

## 2022-03-11 NOTE — Progress Notes (Signed)
   03/11/22 0703  Vitals  Temp 98.1 F (36.7 C)  Temp Source Oral  BP 118/67  MAP (mmHg) 81  BP Location Left Arm  BP Method Automatic  Patient Position (if appropriate) Lying  Pulse Rate 72  Pulse Rate Source Monitor  ECG Heart Rate 71  Resp 18  MEWS COLOR  MEWS Score Color Green  Oxygen Therapy  SpO2 96 %  O2 Device HFNC  O2 Flow Rate (L/min) 5 L/min  MEWS Score  MEWS Temp 0  MEWS Systolic 0  MEWS Pulse 0  MEWS RR 0  MEWS LOC 0  MEWS Score 0  Provider Notification  Provider Name/Title [facility]  Date Provider Notified 03/11/22  Time Provider Notified [phone]  Method of Notification Page  Notification Reason Critical Result  Test performed and critical result troponin 152  Date Critical Result Received 03/11/22  Time Critical Result Received 0725   Received pt from 3W. Vitals as above. Pt lethargic, awakens to voice and quickly falls back asleep. Amio drip being started. Critical troponin of 152 paged to O[facility]ata. Report given to day shift RN. Tele applied and verified x2.

## 2022-03-11 NOTE — Progress Notes (Signed)
  Echocardiogram 2D Echocardiogram has been performed.  Wynelle Link 03/11/2022, 9:30 AM

## 2022-03-11 NOTE — Progress Notes (Signed)
Pt continues to ask for a smoothie. His wife is unable to bring him one. Pt has turned away his lunch tray and any offers of food from staff. Pt educated on importance of eating. Glucose stable at this time.

## 2022-03-11 NOTE — Progress Notes (Signed)
[facility]                                                                              William Barron, is a 76 y.o. male, DOB - October 28, 1945, XYV:[id number] Admit date - 03/05/2022    Outpatient Primary MD for the patient is [name], MD  LOS - 5  days  Chief Complaint  Patient presents with   Headache       Brief summary   Patient is a 76 year old male with history of pancreatic CA status post Whipple, prostate CA, multiple myeloma, amyloidosis, dyslipidemia, DM, presented to ED with right-sided headache and blurred vision.  Last known well was in the evening of 03/04/2022.  He woke up on the morning of admission on 12/22 with difficulty seeing out of his right eye.  He went to see an ophthalmologist and was referred to ER CT head showed acute/subacute right occipital CVA Failed swallow evaluation Neurology was consulted, admitted for further workup..  03/11/2022: Events of earlier this morning is noted.  Patient was found unresponsive and pulseless around 4:45 AM.  Patient was found to have gone into VT/VF cardiac arrest in setting of low EF 30 to 35%.  Patient has been successfully resuscitated.  Patient underwent left and right heart cardiac catheterization afterwards.  Electrophysiology team has been consulted and ICD has been discussed with patient.  Patient is now on amiodarone drip.   Assessment & Plan    Principal Problem: Acute right PCA stroke (HCC) right CN VI palsy, chronic -Presented with visual issues, having difficulty seeing half of the clock.  CT head showed right PCA territory stroke. -MRI brain showed acute to early subacute right PCA territory infarct.  Associated mild petechial blood products without frank hemorrhagic transformation or significant residual mass effect.  Underlying moderate chronic microvascular ischemic disease -CTA head and neck showed acute or early subacute infarct in the right occipital lobe, suspected high-grade  stenosis versus occlusion of the distal right PCA. -2D echo: EF of 35 to 40% with global hypokinesis, mild AS, mild pulmonary regurg, mild MR, mild AR (EF 50 to 55% on echo in 11/2021) -Per neuro, continue aspirin 325 mg and Plavix 75 mg DAPT for 3 months given intracranial stenosis/occlusion then aspirin 81 mg alone daily indefinitely  -Lipid panel showed LDL 66, at goal, no need of statin per neurology -Hemoglobin A1c 6.5 -Loop recorder per neuro, timing per cardiology  Active Problems: New onset systolic CHF, EF 35 to 28% -2D echo showed EF of 35 to 40% with global hypokinesis (was 50 to 55% on echo in 11/2021) -Received Lasix 20 mg IV x 1 on 12/24 -BNP 1326.2, received Lasix 20 mg IV x 1 on 12/24, negative balance of 2.4 L -Continue Coreg, losartan 12.5 mg daily, Farxiga 10 mg daily  -Lasix as needed  Influenza A positive -COVID-19 negative, positive for influenza A - started on Tamiflu 30 mg twice daily, started on 03/07/2022.  Complete 5-day course.   Hypokalemia -Resolved Potassium today is 3.8.  GERD -Continue PPI  Diabetes mellitus type 2, IDDM HbA1c 6.5 Recent Labs    03/09/22 2146 03/10/22 0705 03/10/22  1134 03/10/22 1705 03/10/22 2127 03/11/22 0635  GLUCAP 106* 102* 130* 210* 117* 173*    -CBGs better, continue Semglee 10 units daily, NovoLog 3 units 3 times daily AC, SSI   History of pancreatic CA status post Whipple procedure -Continue Creon 72,000 units 3 times daily with meals -Follows with [name]  History of AL amyloidosis ([place]),  Multiple myeloma ([place]) -Outpatient follow-up with his oncologist, [name] at [facility].  History of prostate CA -Continue outpatient follow-up with urology, Dr. Jeffie Pollock, completed XRT on 06/02/2020    Lesion of parotid gland -MRI brain incidentally also showed 1.8 cm T2 hypointense lesion within the posterior aspect of the right parotid gland, indeterminate.  Recommended nonemergent outpatient ENT  evaluation -CTA showed 1.6 cm partially calcified lesion in the posterior aspect of right parotid gland with multiple surrounding smaller lesion, could potentially represent primary parotid neoplasm benign or malignant or enlarged lymph nodes, recommended outpatient ENT evaluation  Code Status: Full code DVT Prophylaxis:  heparin injection 5,000 Units Start: 03/11/22 2200 SCD's Start: 03/05/22 2235  Level of Care: Level of care: Progressive Family Communication: Discussed with patient Disposition Plan:      Remains inpatient appropriate: PT recommended inpatient rehab.  TOC, CIR assisting and working with wife.  Procedures:  MRI brain, CTA head and neck 2D echo  Consultants:   Neurology Cardiology Electrophysiology.  Antimicrobials: Tamiflu started on 12/24 Medications  [START ON 03/12/2022] aspirin EC  325 mg Oral Daily   Chlorhexidine Gluconate Cloth  6 each Topical Daily   clopidogrel  75 mg Oral Daily   dapagliflozin propanediol  10 mg Oral Daily   heparin  5,000 Units Subcutaneous Q8H   insulin aspart  0-9 Units Subcutaneous TID WC   insulin aspart  3 Units Subcutaneous TID WC   insulin glargine-yfgn  10 Units Subcutaneous Daily   lipase/protease/amylase  72,000 Units Oral TID WC   losartan  12.5 mg Oral Daily   mirabegron ER  25 mg Oral Daily   oseltamivir  30 mg Oral BID   pantoprazole  40 mg Oral Daily   sodium chloride flush  3 mL Intravenous Q12H   spironolactone  12.5 mg Oral Daily      Subjective:   -Patient went into cardiac arrest earlier today.   Objective:   Vitals:   03/11/22 1232 03/11/22 1237 03/11/22 1256 03/11/22 1329  BP: (!) 118/53 (!) 129/54 127/62   Pulse: 65 69 60   Resp: (!) 28 (!) 29 (!) 26   Temp:   (!) 97.5 F (36.4 C)   TempSrc:   Axillary   SpO2:   96% 93%  Weight:      Height:        Intake/Output Summary (Last 24 hours) at 03/11/2022 1335 Last data filed at 03/11/2022 0411 Gross per 24 hour  Intake 240 ml  Output 775  ml  Net -535 ml      Wt Readings from Last 3 Encounters:  03/11/22 58.5 kg  01/28/22 66.3 kg  01/14/22 67.2 kg   MPhysical Exam General: Alert and oriented x 3, NAD Cardiovascular: S1 S2 clear, RRR.  Respiratory: Fairly CTA B Gastrointestinal: Soft, nontender, nondistended, NBS Neuro: no new deficits    Data Reviewed:  I have personally reviewed following labs    CBC Lab Results  Component Value Date   WBC 3.7 (L) 03/11/2022   RBC 4.62 03/11/2022   HGB 11.2 (L) 03/11/2022   HCT 33.0 (L) 03/11/2022  MCV 76.2 (L) 03/11/2022   MCH 24.0 (L) 03/11/2022   PLT 316 03/11/2022   MCHC 31.5 03/11/2022   RDW 18.6 (H) 03/11/2022   LYMPHSABS 1.1 03/05/2022   MONOABS 0.5 03/05/2022   EOSABS 0.1 03/05/2022   BASOSABS 0.1 35/32/9924     Last metabolic panel Lab Results  Component Value Date   NA 137 03/11/2022   K 3.9 03/11/2022   CL 104 03/11/2022   CO2 21 (L) 03/11/2022   BUN 33 (H) 03/11/2022   CREATININE 1.37 (H) 03/11/2022   GLUCOSE 175 (H) 03/11/2022   GFRNONAA 53 (L) 03/11/2022   GFRAA >60 07/13/2018   CALCIUM 8.2 (L) 03/11/2022   PHOS 3.1 09/10/2021   PROT 5.6 (L) 03/06/2022   ALBUMIN 2.7 (L) 03/06/2022   LABGLOB 2.7 06/21/2018   AGRATIO 1.3 06/21/2018   BILITOT 0.5 03/06/2022   ALKPHOS 564 (H) 03/06/2022   AST 28 03/06/2022   ALT 26 03/06/2022   ANIONGAP 12 03/11/2022    CBG (last 3)  Recent Labs    03/10/22 1705 03/10/22 2127 03/11/22 0635  GLUCAP 210* 117* 173*       Coagulation Profile: Recent Labs  Lab 03/05/22 1632  INR 1.2      Radiology Studies: I have personally reviewed the imaging studies  CARDIAC CATHETERIZATION  Result Date: 03/11/2022   Dist RCA lesion is 60-70% stenosed.   2nd Diag lesion is 70% stenosed.   Mid LAD lesion is 60% stenosed.   Ost LAD to Prox LAD lesion is 25% stenosed.   Dist LAD lesion is 70% stenosed.   LV end diastolic pressure is normal.   There is no aortic valve stenosis.   On 4L [place]: Ao sat 93%, PA  sat 45%, PA pressure 23/5, mean PA pressure 14 mm Hg; mean PCWP 10/12, mean PCWP 9 mm Hg; CO 3.22 L/min; CI 1.87. Moderate focal disease.  Cardiomyopathy is out of proportion to degree of CAD.  LV dysfunction with significant decrease in cardiac output.  Discussed with the wife and son.    ECHOCARDIOGRAM LIMITED  Result Date: 03/11/2022    ECHOCARDIOGRAM LIMITED REPORT   Patient Name:   William Barron Date of Exam: 03/11/2022 Medical Rec #:  [id number]           Height:       65.0 in Accession #:    [id number]          Weight:       142.6 lb Date of Birth:  1945-10-16            BSA:          1.713 m Patient Age:    39 years            BP:           111/63 mmHg Patient Gender: M                   HR:           67 bpm. Exam Location:  Inpatient Procedure: Limited Echo, Cardiac Doppler and Limited Color Doppler Indications:    Ventricular Tachycardia I47.2  History:        Patient has prior history of Echocardiogram examinations. CHF,                 Stroke, Aortic Valve Disease, Signs/Symptoms:Chest Pain; Risk                 Factors:Dyslipidemia, Former Smoker and  Diabetes.  Sonographer:    Greer Pickerel Referring Phys: [id number] [place]  Sonographer Comments: Technically challenging study due to limited acoustic windows. Image acquisition challenging due to patient body habitus and Image acquisition challenging due to respiratory motion. IMPRESSIONS  1. Left ventricular ejection fraction, by estimation, is 30 to 35%. The left ventricle has moderately decreased function. The left ventricle demonstrates regional wall motion abnormalities (see scoring diagram/findings for description). Left ventricular  diastolic parameters are indeterminate.  2. Right ventricular systolic function is normal. The right ventricular size is normal.  3. A small pericardial effusion is present. The pericardial effusion is circumferential.  4. The mitral valve is normal in structure. Trivial mitral valve regurgitation. No  evidence of mitral stenosis.  5. The aortic valve was not well visualized.  6. The inferior vena cava is normal in size with greater than 50% respiratory variability, suggesting right atrial pressure of 3 mmHg. FINDINGS  Left Ventricle: Left ventricular ejection fraction, by estimation, is 30 to 35%. The left ventricle has moderately decreased function. The left ventricle demonstrates regional wall motion abnormalities. Left ventricular diastolic parameters are indeterminate.  LV Wall Scoring: The inferior septum and entire inferior wall are akinetic. The entire anterior wall, entire lateral wall, anterior septum, and apex are hypokinetic. Right Ventricle: The right ventricular size is normal. No increase in right ventricular wall thickness. Right ventricular systolic function is normal. Left Atrium: Left atrial size was not well visualized. Right Atrium: Right atrial size was not well visualized. Pericardium: A small pericardial effusion is present. The pericardial effusion is circumferential. Mitral Valve: The mitral valve is normal in structure. Trivial mitral valve regurgitation. No evidence of mitral valve stenosis. Tricuspid Valve: The tricuspid valve is normal in structure. Tricuspid valve regurgitation is trivial. No evidence of tricuspid stenosis. Aortic Valve: The aortic valve was not well visualized. Pulmonic Valve: The pulmonic valve was not well visualized. Aorta: The aortic root was not well visualized. Venous: The inferior vena cava is normal in size with greater than 50% respiratory variability, suggesting right atrial pressure of 3 mmHg. Additional Comments: Spectral Doppler performed. Color Doppler performed.   LV Volumes (MOD) LV vol d, MOD A2C: 143.0 ml LV vol d, MOD A4C: 161.0 ml LV vol s, MOD A2C: 89.5 ml LV vol s, MOD A4C: 81.3 ml LV SV MOD A2C:     53.5 ml LV SV MOD A4C:     161.0 ml LV SV MOD BP:      66.0 ml MITRAL VALVE MV Area (PHT): 2.36 cm MV Decel Time: 322 msec MV E velocity: 53.10  cm/s MV A velocity: 67.30 cm/s MV E/A ratio:  0.79 Kardie Tobb DO Electronically signed by Berniece Salines DO Signature Date/Time: 03/11/2022/10:53:18 AM    Final    DG CHEST PORT 1 VIEW  Result Date: 03/11/2022 CLINICAL DATA:  76 year old male with history of hypoxia. EXAM: PORTABLE CHEST 1 VIEW COMPARISON:  Chest x-ray 03/07/2022. FINDINGS: Diffuse interstitial prominence and widespread peribronchial cuffing throughout the lungs bilaterally (right greater than left). Mass-like opacity in the perihilar aspect of the right lung with surrounding ill-defined airspace consolidation. No pleural effusions. No pneumothorax. Heart size is normal. IMPRESSION: 1. The appearance the chest is concerning for bronchitis, likely with developing bronchopneumonia in the right middle and/or lower lobes. Mass-like opacity in the perihilar aspect of the right lung is presumably part of this airspace consolidation given the rapid development compared to the recent prior study. However, underlying neoplasm is difficult to exclude.  Followup PA and lateral chest X-ray is recommended in 3-4 weeks following trial of antibiotic therapy to ensure resolution and exclude underlying malignancy. Electronically Signed   By: Vinnie Langton M.D.   On: 03/11/2022 07:09       Bonnell Public M.D. [facility] 03/11/2022, 1:35 PM  Available via Epic secure chat 7am-7pm After 7 pm, please refer to night coverage provider listed on amion.

## 2022-03-11 NOTE — H&P (View-Only) (Signed)
Cardiology Progress Note  Patient ID: William Barron MRN: [id number] DOB: Feb 07, 1946 Date of Encounter: 03/11/2022  Primary Cardiologist: Minus Breeding, MD  Subjective   Chief Complaint: Cardiac arrest this morning   HPI: VT/VF this AM. 1 minute CPR. No defib. Hemodynamically stable this AM. Needs LHC/RHC.   ROS:  All other ROS reviewed and negative. Pertinent positives noted in the HPI.     Inpatient Medications  Scheduled Meds:  aspirin EC  325 mg Oral Daily   carvedilol  3.125 mg Oral BID WC   Chlorhexidine Gluconate Cloth  6 each Topical Daily   clopidogrel  75 mg Oral Daily   dapagliflozin propanediol  10 mg Oral Daily   insulin aspart  0-9 Units Subcutaneous TID WC   insulin aspart  3 Units Subcutaneous TID WC   insulin glargine-yfgn  10 Units Subcutaneous Daily   lipase/protease/amylase  72,000 Units Oral TID WC   losartan  12.5 mg Oral Daily   mirabegron ER  25 mg Oral Daily   oseltamivir  30 mg Oral BID   pantoprazole  40 mg Oral Daily   spironolactone  12.5 mg Oral Daily   Continuous Infusions:  amiodarone 60 mg/hr (03/11/22 0752)   Followed by   amiodarone     amiodarone     PRN Meds: acetaminophen **OR** acetaminophen (TYLENOL) oral liquid 160 mg/5 mL **OR** acetaminophen, amiodarone, ipratropium-albuterol   Vital Signs   Vitals:   03/11/22 0703 03/11/22 0735 03/11/22 0745 03/11/22 0800  BP: 118/67 105/65 104/61 (!) 94/59  Pulse: 72 69 70 67  Resp: 18 (!) 25 (!) 24 17  Temp: 98.1 F (36.7 C)     TempSrc: Oral     SpO2: 96% 100% 95% 95%  Weight:      Height:        Intake/Output Summary (Last 24 hours) at 03/11/2022 0815 Last data filed at 03/11/2022 0411 Gross per 24 hour  Intake 240 ml  Output 775 ml  Net -535 ml      03/08/2022    5:00 AM 03/07/2022    6:00 AM 03/05/2022    4:19 PM  Last 3 Weights  Weight (lbs) 142 lb 10.2 oz 140 lb 3.4 oz 140 lb  Weight (kg) 64.7 kg 63.6 kg 63.504 kg      Telemetry  Overnight telemetry  shows SR 70s, which I personally reviewed.   ECG  The most recent ECG shows sinus rhythm heart 69, no acute ischemic changes or evidence of infarction, which I personally reviewed.   Physical Exam   Vitals:   03/11/22 0703 03/11/22 0735 03/11/22 0745 03/11/22 0800  BP: 118/67 105/65 104/61 (!) 94/59  Pulse: 72 69 70 67  Resp: 18 (!) 25 (!) 24 17  Temp: 98.1 F (36.7 C)     TempSrc: Oral     SpO2: 96% 100% 95% 95%  Weight:      Height:        Intake/Output Summary (Last 24 hours) at 03/11/2022 0815 Last data filed at 03/11/2022 0411 Gross per 24 hour  Intake 240 ml  Output 775 ml  Net -535 ml       03/08/2022    5:00 AM 03/07/2022    6:00 AM 03/05/2022    4:19 PM  Last 3 Weights  Weight (lbs) 142 lb 10.2 oz 140 lb 3.4 oz 140 lb  Weight (kg) 64.7 kg 63.6 kg 63.504 kg    Body mass index is 23.74 kg/m.  General: Well  nourished, well developed, in no acute distress Head: Atraumatic, normal size  Eyes: PEERLA, EOMI  Neck: Supple, no JVD Endocrine: No thryomegaly Cardiac: Normal S1, S2; RRR; no murmurs, rubs, or gallops Lungs: Diminished breath sounds bilaterally Abd: Soft, nontender, no hepatomegaly  Ext: No edema, pulses 2+ Musculoskeletal: No deformities, BUE and BLE strength normal and equal Skin: Warm and dry, no rashes   Neuro: Alert and oriented to person, place, time, and situation, CNII-XII grossly intact, no focal deficits  Psych: Normal mood and affect   Labs  High Sensitivity Troponin:   Recent Labs  Lab 03/11/22 0539  TROPONINIHS 152*     Cardiac EnzymesNo results for input(s): "TROPONINI" in the last 168 hours. No results for input(s): "TROPIPOC" in the last 168 hours.  Chemistry Recent Labs  Lab 03/05/22 1632 03/05/22 1725 03/06/22 0615 03/07/22 0723 03/09/22 0435 03/11/22 0215 03/11/22 0539  NA 139   < > 138   < > 135 137 137  K 3.2*   < > 3.3*   < > 4.0 4.0 3.9  CL 106   < > 108   < > 99 103 104  CO2 23  --  22   < > 23 24 21*  GLUCOSE  128*   < > 63*   < > 156* 130* 175*  BUN 13   < > 10   < > 24* 31* 33*  CREATININE 0.98   < > 0.89   < > 0.99 1.32* 1.37*  CALCIUM 9.1  --  8.5*   < > 8.6* 8.3* 8.2*  PROT 7.1  --  5.6*  --   --   --   --   ALBUMIN 3.5  --  2.7*  --   --   --   --   AST 39  --  28  --   --   --   --   ALT 33  --  26  --   --   --   --   ALKPHOS 727*  --  564*  --   --   --   --   BILITOT 0.4  --  0.5  --   --   --   --   GFRNONAA >60  --  >60   < > >60 55* 53*  ANIONGAP 10  --  8   < > '13 10 12   '$ < > = values in this interval not displayed.    Hematology Recent Labs  Lab 03/05/22 1632 03/05/22 1725 03/07/22 0723 03/11/22 0215  WBC 7.0  --  7.0 3.7*  RBC 4.35  --  3.88* 4.62  HGB 10.4* 11.9* 9.3* 11.1*  HCT 34.1* 35.0* 29.7* 35.2*  MCV 78.4*  --  76.5* 76.2*  MCH 23.9*  --  24.0* 24.0*  MCHC 30.5  --  31.3 31.5  RDW 18.2*  --  17.9* 18.6*  PLT 419*  --  334 316   BNP Recent Labs  Lab 03/07/22 1237  BNP 1,326.2*    DDimer No results for input(s): "DDIMER" in the last 168 hours.   Radiology  DG CHEST PORT 1 VIEW  Result Date: 03/11/2022 CLINICAL DATA:  76 year old male with history of hypoxia. EXAM: PORTABLE CHEST 1 VIEW COMPARISON:  Chest x-ray 03/07/2022. FINDINGS: Diffuse interstitial prominence and widespread peribronchial cuffing throughout the lungs bilaterally (right greater than left). Mass-like opacity in the perihilar aspect of the right lung with surrounding ill-defined airspace consolidation. No pleural effusions.  No pneumothorax. Heart size is normal. IMPRESSION: 1. The appearance the chest is concerning for bronchitis, likely with developing bronchopneumonia in the right middle and/or lower lobes. Mass-like opacity in the perihilar aspect of the right lung is presumably part of this airspace consolidation given the rapid development compared to the recent prior study. However, underlying neoplasm is difficult to exclude. Followup PA and lateral chest X-ray is recommended in 3-4  weeks following trial of antibiotic therapy to ensure resolution and exclude underlying malignancy. Electronically Signed   By: Vinnie Langton M.D.   On: 03/11/2022 07:09    Cardiac Studies  TTE 03/06/2022  1. Left ventricular ejection fraction, by estimation, is 30 to 35%. Left  ventricular ejection fraction by 3D volume is 37 %. The left ventricle has  moderately decreased function. The left ventricle demonstrates global  hypokinesis. The left ventricular  internal cavity size was mildly dilated. There is mild left ventricular  hypertrophy. Left ventricular diastolic parameters are consistent with  Grade II diastolic dysfunction (pseudonormalization).   2. Right ventricular systolic function is normal. The right ventricular  size is normal. Tricuspid regurgitation signal is inadequate for assessing  PA pressure.   3. Left atrial size was mildly dilated.   4. A small pericardial effusion is present. The pericardial effusion is  circumferential.   5. The mitral valve is grossly normal. Mild mitral valve regurgitation.  No evidence of mitral stenosis.   6. The aortic valve is abnormal. There is moderate calcification of the  aortic valve. Aortic valve regurgitation is mild. Aortic valve  sclerosis/calcification is present. Mild aortic valve stenosis noted on  prior echocardiogram. Reduced gradient on  today's exam may be secondary to low flow state, with stroke volume index  of 32 mL/m2.   7. The inferior vena cava is dilated in size with >50% respiratory  variability, suggesting right atrial pressure of 8 mmHg.   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 Cardiology consulted due to newly reduced ejection fraction.    Assessment & Plan   # VT/VF  arrest -Went into ventricular tachycardia versus polymorphic VT this morning around 4:44 AM.  Received 1 minute of chest compressions.  He was pulseless.  He did not require defibrillation.  ROSC was achieved within 1 minute. -Hemodynamically stable. -Unclear etiology.  Lactic acid is normal.  Electrolytes are normal.  Kidney function is stable.  He has been admitted with stroke but is recovering.  Also complicated by influenza A.  He also has a new onset cardiomyopathy so clearly the combination of influenza and congestive heart failure could have resulted in ventricular arrhythmias. -I have recommended a left and right heart catheterization today to exclude obstructive CAD.  Left heart cath in 2017 with nonobstructive disease.  We had planned for this as an outpatient but given current events we will proceed today with this.  NPO.  Risk and benefits explained.  I discussed this with his wife as well by phone.  They are both willing to proceed. -For now we will continue amiodarone drip.  Troponin is minimally elevated.  No need for heparin.  He reports no chest pain or trouble breathing.  He is completely hemodynamically stable. -EP will see the patient after the heart catheterization today.  Suspect he will end up needing an ICD for secondary prevention unless  we find a reversible cause. -Repeat stat limited echo as well.  Shared Decision Making/Informed Consent The risks [stroke (1 in 1000), death (1 in 1000), kidney failure [usually temporary] (1 in 500), bleeding (1 in 200), allergic reaction [possibly serious] (1 in 200)], benefits (diagnostic support and management of coronary artery disease) and alternatives of a cardiac catheterization were discussed in detail with Mr. Geers and he is willing to proceed.   # New onset systolic heart failure, EF 35 to 40% -Etiology unclear.  Now with VT VF arrest.  Plan for left and right heart catheterization today.  Initially plan to do this as an  outpatient given current admission for stroke as well as influenza A.  However given recent events we will proceed today.  He has been cleared for left heart catheterization by neurology. -Will continue current medical management which includes carvedilol 3.25 mg twice daily, losartan 12.5 mg daily, Aldactone 12.5 mg daily, Farxiga 10 mg daily. -He was on Lasix as needed.  Right heart catheterization will let us know if he is still volume overloaded but he does not appear this way clinically.  # Acute right PCA stroke # Chronic cerebral microvascular disease -Admitted with stroke.  Symptoms of vision loss were the main issue.  Plan was for loop recorder but he may end up needing an ICD. -Continue aspirin Plavix per neurology. -Neurology is okay to move forward left heart catheterization today.  # Influenza A # Right lower lobe infiltrate on chest x-ray -Suspect he may have aspirated based on the change in his chest x-ray.  Antibiotics per hospital medicine team.  # Pancreatic cancer status post Whipple # AL amyloidosis/Myeloma  # GI amyloidosis  -He underwent Whipple procedure in June of this year for IPMN.   -He also has history of AL amyloidosis on chemotherapy.  He is never had cardiac involvement from what I can tell.  His echo was inconsistent with an infiltrative cardiomyopathy. -Cardiac MRI negative at [facility] for amyloid infiltration.  This was performed on 09/04/2020. -He is currently on chemotherapy regimen per recent review of [name] records from 03/03/2022.  No mention of prognosis.  This may be important information if considering ICD.  I will defer this to the electrophysiology team.  CRITICAL CARE Performed by: Lake Bells T O'Neal  Total critical care time: 45 minutes. Critical care time was exclusive of separately billable procedures and treating other patients. Critical care was necessary to treat or prevent imminent or life-threatening deterioration. Critical care was time spent  personally by me on the following activities: development of treatment plan with patient and/or surrogate as well as nursing, discussions with consultants, evaluation of patient's response to treatment, examination of patient, obtaining history from patient or surrogate, ordering and performing treatments and interventions, ordering and review of laboratory studies, ordering and review of radiographic studies, pulse oximetry and re-evaluation of patient's condition.   Signed, Addison Naegeli. Audie Box, MD, [place]  03/11/2022 8:31 AM      For questions or updates, please contact [place] Please consult [email or web address] for contact info under

## 2022-03-11 NOTE — Consult Note (Signed)
Cardiology Consultation   Patient ID: William Barron MRN: [id number]; DOB: 01-11-1946  Admit date: 03/05/2022 Date of Consult: 03/11/2022  PCP:  [name], [place] [facility] Cardiologist:  Minus Breeding, MD   {    Patient Profile:   William Barron is a 76 y.o. male with a hx of DM, HLD,  pancreatic cyst s/p Whipple in 07/2021 at [facility], and multiple myeloma/ AL amyloidosis followed by Oncology at [facility] (Previously on Daratumumab, Ixazomib, and Dexamethasone. He underwent a Whipple procedure in 07/2021 at [facility] for an intraductal papillary mucinous neoplasm. Pathology demonstrated changes of amyloidosis and benign cyst, procedure complicated by delayed gastric emptying, R foot drop), who is being seen 03/11/2022 for the evaluation of cardiac arrest at the request of Dr. Marisue Ivan.  History of Present Illness:   Mr. William Barron was admitted 03/05/22 with c/o headache, vision changes, CTA showed acute or early subacute infarct in the the right occipital lobe (PCA territory) and suspect high-grade stenosis vs occlusion of the distal (mid to distal P3) right PCA. Brain MRI also showed acute to early subacute right PCA territory infarct with associated mild petechial blood products without frank hemorrhagic transformation or significant regional mass effect. MRI also showed a hypointense lesion within the posterior aspect of the right parotid gland.  Admitted with stroke.  Acute R PCA stroke, embolic pattern likely from occult afib vs. Cardiomyopathy with low EF   TTE noted new reduction in LVEF and cardiology was brought on board.  Planned to start and advance GDMT as able and pursue further evaluation for ischemia and amyloid out patient once recovered from his stroke. Influenza A + as well  His clinical course progressing, getting ready to transfer to CIR, EP initially requested by neurology to consider loop implant for his stroke. This AM he had a brief cardiac  arrest, VT/VF reported, with 1 min of CPR, no defibrillation, no drug, though post was started on amiodarone gtt.  Cath today with multivessel, non-obstructive CAD, LVEDP was normal, R heart numbers: 4L [place]: Ao sat 93%, PA sat 45%, PA pressure 23/5, mean PA pressure 14 mm Hg; mean PCWP 10/12, mean PCWP 9 mm Hg; CO 3.22 L/min; CI 1.87   CXR: appearance the chest is concerning for bronchitis, likely with developing bronchopneumonia in the right middle and/or lower lobes. Mass-like opacity in the perihilar aspect of the right lung is presumably part of this airspace consolidation given the rapid development compared to the recent prior study. However, underlying neoplasm is difficult to exclude   EP is asked on board for consideration of possible ICD.  LABS K+ 4.0 > 3.9 > 3.9 Mag 2.3 BUN/Creat 33/1.37 HST Trop 152, 144 Lactic acid 1.9, 1.8 WBC 3.7 H/H 11/35 Plts 316  Feels tired/weak, no CP, denies SOB, cough is improved  Past Medical History:  Diagnosis Date   Amyloidosis (William Barron)    Diabetes mellitus without complication (William Barron)    Feeding tube dysfunction, initial encounter 08/12/2021   GERD (gastroesophageal reflux disease)    Hearing loss    Has hearing aids   History of kidney stones    Hyperlipidemia    Jejunostomy tube present ([place])    Multiple myeloma (William Barron)    and amylodosis    Nephrolithiasis    Occasional tremors    Pancreatic insufficiency    Pneumonia    Prostate cancer (William Barron)    Prostatitis    acute and chronic   Skin cancer  Past Surgical History:  Procedure Laterality Date   APPENDECTOMY     CARDIAC CATHETERIZATION N/A 06/13/2015   Procedure: Left Heart Cath and Coronary Angiography;  Surgeon: Jettie Booze, MD;  Location: [place] [facility];  Service: Cardiovascular;  Laterality: N/A;   COLONOSCOPY  04/07/2000   ELBOW SURGERY  03/15/2001   right   ESOPHAGOGASTRODUODENOSCOPY (EGD) WITH PROPOFOL N/A 09/07/2021   Procedure:  ESOPHAGOGASTRODUODENOSCOPY (EGD) WITH PROPOFOL;  Surgeon: Irene Shipper, MD;  Location: [place];  Service: Gastroenterology;  Laterality: N/A;   EXTRACORPOREAL SHOCK WAVE LITHOTRIPSY Left 07/17/2018   Procedure: EXTRACORPOREAL SHOCK WAVE LITHOTRIPSY (ESWL);  Surgeon: [name], MD;  Location: [facility];  Service: Urology;  Laterality: Left;   EXTRACORPOREAL SHOCK WAVE LITHOTRIPSY Right 12/08/2020   Procedure: RIGHT EXTRACORPOREAL SHOCK WAVE LITHOTRIPSY (ESWL);  Surgeon: Raynelle Bring, MD;  Location: [facility];  Service: Urology;  Laterality: Right;   FOOT ARTHRODESIS Right 04/15/2021   Procedure: RIGHT SUBTALAR AND TALONAVICULAR FUSION;  Surgeon: Newt Minion, MD;  Location: [place];  Service: Orthopedics;  Laterality: Right;   HERNIA REPAIR     IR MECH REMOV OBSTRUC MAT ANY COLON TUBE W/FLUORO  08/27/2021   IR REPLC GASTRO/COLONIC TUBE PERCUT W/FLUORO  09/08/2021   RIGHT/LEFT HEART CATH AND CORONARY ANGIOGRAPHY N/A 03/11/2022   Procedure: RIGHT/LEFT HEART CATH AND CORONARY ANGIOGRAPHY;  Surgeon: Jettie Booze, MD;  Location: [place] [facility];  Service: Cardiovascular;  Laterality: N/A;   ROTATOR CUFF REPAIR     WHIPPLE PROCEDURE  07/13/2021     Home Medications:  Prior to Admission medications   Medication Sig Start Date End Date Taking? Authorizing Provider  Cyanocobalamin (B-12 PO) Take 1 tablet by mouth in the morning.   Yes [provider]  insulin aspart (NOVOLOG FLEXPEN) 100 UNIT/ML FlexPen Inject 8 Units into the skin 3 (three) times daily before meals.   Yes [provider]  lipase/protease/amylase (CREON) 36000 UNITS CPEP capsule Take 3 capsules (108,000 Units total) by mouth 3 (three) times daily with meals. May also take 1 capsule (36,000 Units total) as needed (with snacks). Patient taking differently: Take 2-3 capsules (72000-108000 units) by mouth 3 (three) times daily with meals. May also take 1 capsule (36,000 units) as needed  (with snacks). 01/14/22  Yes Gatha Mayer, MD  pantoprazole sodium (PROTONIX) 40 mg Take 40 mg by mouth daily. Patient taking differently: Take 40 mg by mouth in the morning. 01/14/22  Yes Gatha Mayer, MD  Testosterone 20.25 MG/ACT (1.62%) GEL Apply 4 Pump topically at bedtime.   Yes [provider]  Vibegron (GEMTESA) 75 MG TABS Take 75 mg by mouth in the morning.   Yes [provider]  BD PEN NEEDLE NANO U/F 32G X 4 MM MISC USE AS DIRECTED WITH INSULIN PEN THREE TIMES DAILY 01/27/22   Elayne Snare, MD  Continuous Blood Gluc Receiver (FREESTYLE LIBRE 2 READER) DEVI Use as instructed to check blood sugars 11/20/21   Elayne Snare, MD  Continuous Blood Gluc Sensor (FREESTYLE LIBRE 3 SENSOR) MISC Place 1 sensor on the skin every 14 days. Use to check glucose continuously 03/05/22   Elayne Snare, MD  insulin degludec (TRESIBA FLEXTOUCH) 100 UNIT/ML FlexTouch Pen Inject 15 Units into the skin daily. Patient taking differently: Inject 6 Units into the skin in the morning. 11/17/21   Elayne Snare, MD    Inpatient Medications: Scheduled Meds:  [START ON 03/12/2022] aspirin EC  325 mg Oral Daily  Chlorhexidine Gluconate Cloth  6 each Topical Daily   clopidogrel  75 mg Oral Daily   dapagliflozin propanediol  10 mg Oral Daily   heparin  5,000 Units Subcutaneous Q8H   insulin aspart  0-9 Units Subcutaneous TID WC   insulin aspart  3 Units Subcutaneous TID WC   insulin glargine-yfgn  10 Units Subcutaneous Daily   lipase/protease/amylase  72,000 Units Oral TID WC   losartan  12.5 mg Oral Daily   mirabegron ER  25 mg Oral Daily   oseltamivir  30 mg Oral BID   pantoprazole  40 mg Oral Daily   sodium chloride flush  3 mL Intravenous Q12H   spironolactone  12.5 mg Oral Daily   Continuous Infusions:  sodium chloride     amiodarone 30 mg/hr (03/11/22 1308)   amiodarone     PRN Meds: sodium chloride, acetaminophen **OR** acetaminophen (TYLENOL) oral liquid 160 mg/5 mL **OR**  acetaminophen, amiodarone, hydrALAZINE, ipratropium-albuterol, labetalol, sodium chloride flush  Allergies:   No Known Allergies  Social History:   Social History   Socioeconomic History   Marital status: Married    Spouse name: Not on file   Number of children: 2   Years of education: Not on file   Highest education level: Not on file  Occupational History   Occupation: retired  Tobacco Use   Smoking status: Former    Packs/day: 3.00    Years: 20.00    Total pack years: 60.00    Types: Cigarettes    Quit date: 03/15/1977    Years since quitting: 45.0   Smokeless tobacco: Never   Tobacco comments:    quit 35 years ago  Vaping Use   Vaping Use: Never used  Substance and Sexual Activity   Alcohol use: Yes    Alcohol/week: 3.0 standard drinks of alcohol    Types: 3 Glasses of wine per week    Comment: approx 1 drink/night   Drug use: No   Sexual activity: Not Currently    Birth control/protection: None  Other Topics Concern   Not on file  Social History Narrative   Married, 2 children   Retired criminal Counsellor and [employer]   1 drink/day   Social Determinants of Health   Financial Resource Strain: Not on file  Food Insecurity: No Food Insecurity (03/09/2022)   Hunger Vital Sign    Worried About Running Out of Food in the Last Year: Never true    Ran Out of Food in the Last Year: Never true  Transportation Needs: No Transportation Needs (03/09/2022)   PRAPARE - Hydrologist (Medical): No    Lack of Transportation (Non-Medical): No  Physical Activity: Not on file  Stress: Not on file  Social Connections: Not on file  Intimate Partner Violence: Not At Risk (03/09/2022)   Humiliation, Afraid, Rape, and Kick questionnaire    Fear of Current or Ex-Partner: No    Emotionally Abused: No    Physically Abused: No    Sexually Abused: No    Family History:   Family History  Problem Relation Age of Onset    Pancreatic cancer Father    Prostate cancer Father    Parkinsonism Mother    CAD Brother 48   Cancer Paternal Grandmother    Breast cancer Neg Hx    Colon cancer Neg Hx      ROS:  Please see the history of present illness.  All other ROS reviewed  and negative.     Physical Exam/Data:   Vitals:   03/11/22 1329 03/11/22 1330 03/11/22 1345 03/11/22 1400  BP:  (!) 122/53 (!) 121/54 (!) 125/51  Pulse:  64 65   Resp:  (!) _0 Temp:      TempSrc:      SpO2: 93% 95% 95%   Weight:      Height:        Intake/Output Summary (Last 24 hours) at 03/11/2022 1412 Last data filed at 03/11/2022 0411 Gross per 24 hour  Intake 240 ml  Output 775 ml  Net -535 ml      03/11/2022    9:06 AM 03/08/2022    5:00 AM 03/07/2022    6:00 AM  Last 3 Weights  Weight (lbs) 128 lb 14.4 oz 142 lb 10.2 oz 140 lb 3.4 oz  Weight (kg) 58.469 kg 64.7 kg 63.6 kg     Body mass index is 21.45 kg/m.  General:  Well nourished, well developed, in no acute distress, appears frail, chronically ill HEENT: normal Neck: no JVD Vascular: No carotid bruits Cardiac:  RRR; 2/6 SM Lungs:  CTA b/l, no wheezing, rhonchi or rales  Abd: soft, nontender Ext: no edema Musculoskeletal:  No deformities, advanced atrophy Skin: warm and dry  Neuro:  no focal abnormalities noted Psych:  Normal affect   EKG:  The EKG was personally reviewed and demonstrates:    12/22: baseline motion, SR 84bpm Today SR 69bpm, normal intervals, no pre excitation  Telemetry:  Telemetry was personally reviewed and demonstrates:   SR > VT/VFlutter >> slows > CHB with 6 second V pause > slow VT > SR  Relevant CV Studies:   03/11/22: R/LHC  Dist RCA lesion is 60-70% stenosed.   2nd Diag lesion is 70% stenosed.   Mid LAD lesion is 60% stenosed.   Ost LAD to Prox LAD lesion is 25% stenosed.   Dist LAD lesion is 70% stenosed.   LV end diastolic pressure is normal.   There is no aortic valve stenosis.   On 4L [place]: Ao sat 93%,  PA sat 45%, PA pressure 23/5, mean PA pressure 14 mm Hg; mean PCWP 10/12, mean PCWP 9 mm Hg; CO 3.22 L/min; CI 1.87.   Moderate focal disease.  Cardiomyopathy is out of proportion to degree of CAD.     LV dysfunction with significant decrease in cardiac output.  Discussed with the wife and son.     TTE 03/06/2022  1. Left ventricular ejection fraction, by estimation, is 30 to 35%. Left  ventricular ejection fraction by 3D volume is 37 %. The left ventricle has  moderately decreased function. The left ventricle demonstrates global  hypokinesis. The left ventricular  internal cavity size was mildly dilated. There is mild left ventricular  hypertrophy. Left ventricular diastolic parameters are consistent with  Grade II diastolic dysfunction (pseudonormalization).   2. Right ventricular systolic function is normal. The right ventricular  size is normal. Tricuspid regurgitation signal is inadequate for assessing  PA pressure.   3. Left atrial size was mildly dilated.   4. A small pericardial effusion is present. The pericardial effusion is  circumferential.   5. The mitral valve is grossly normal. Mild mitral valve regurgitation.  No evidence of mitral stenosis.   6. The aortic valve is abnormal. There is moderate calcification of the  aortic valve. Aortic valve regurgitation is mild. Aortic valve  sclerosis/calcification is present. Mild aortic valve stenosis noted on  prior echocardiogram. Reduced gradient on  today's exam may be secondary to low flow state, with stroke volume index  of 32 mL/m2.   7. The inferior vena cava is dilated in size with >50% respiratory  variability, suggesting right atrial pressure of 8 mmHg.    12/01/21: TTE  1. Left ventricular ejection fraction, by estimation, is 55 to 60%. The  left ventricle has normal function. The left ventricle has no regional  wall motion abnormalities. There is mild concentric left ventricular  hypertrophy. Left ventricular  diastolic  parameters are consistent with Grade I diastolic dysfunction (impaired  relaxation).   2. Right ventricular systolic function is normal. The right ventricular  size is normal. There is normal pulmonary artery systolic pressure. The  estimated right ventricular systolic pressure is 58.0 mmHg.   3. Left atrial size was mildly dilated.   4. A small pericardial effusion is present. The pericardial effusion is  circumferential.   5. The mitral valve is grossly normal. Mild to moderate mitral valve  regurgitation. No evidence of mitral stenosis.   6. The aortic valve is tricuspid. There is moderate calcification of the  aortic valve. There is moderate thickening of the aortic valve. Aortic  valve regurgitation is mild to moderate. Mild to moderate aortic valve  stenosis. Aortic regurgitation PHT  measures 447 msec. Aortic valve area, by VTI measures 1.48 cm. Aortic  valve mean gradient measures 16.0 mmHg. Aortic valve Vmax measures 2.73  m/s.   7. Pulmonic valve regurgitation is moderate.   8. The inferior vena cava is dilated in size with >50% respiratory  variability, suggesting right atrial pressure of 8 mmHg.   Comparison(s): No significant change from prior study.    06/13/15: LHC Prox Cx lesion, 25% stenosed. Prox LAD lesion, 25% stenosed. Mid LAD lesion, 25% stenosed. Ost LM to LM lesion, 20% stenosed. The left ventricular systolic function is normal.   Mild to moderate scattered atherosclerosis. No hemodynamically significant lesions. Continue aggressive medical therapy.  Laboratory Data:  High Sensitivity Troponin:   Recent Labs  Lab 03/11/22 0539 03/11/22 0721  TROPONINIHS 152* 144*     Chemistry Recent Labs  Lab 03/07/22 0723 03/08/22 0303 03/09/22 0435 03/11/22 0215 03/11/22 0539 03/11/22 1214  NA 137   < > 135 137 137 137  K 3.4*   < > 4.0 4.0 3.9 3.9  CL 107   < > 99 103 104  --   CO2 22   < > 23 24 21*  --   GLUCOSE 184*   < > 156* 130* 175*   --   BUN 10   < > 24* 31* 33*  --   CREATININE 0.98   < > 0.99 1.32* 1.37*  --   CALCIUM 8.5*   < > 8.6* 8.3* 8.2*  --   MG 1.7  --   --   --  2.3  --   GFRNONAA >60   < > >60 55* 53*  --   ANIONGAP 8   < > _0 --    < > = values in this interval not displayed.    Recent Labs  Lab 03/05/22 1632 03/06/22 0615  PROT 7.1 5.6*  ALBUMIN 3.5 2.7*  AST 39 28  ALT 33 26  ALKPHOS 727* 564*  BILITOT 0.4 0.5   Lipids  Recent Labs  Lab 03/05/22 2356  CHOL 119  TRIG 75  HDL 38*  LDLCALC 66  CHOLHDL 3.1  Hematology Recent Labs  Lab 03/05/22 1632 03/05/22 1725 03/07/22 0723 03/11/22 0215 03/11/22 1214  WBC 7.0  --  7.0 3.7*  --   RBC 4.35  --  3.88* 4.62  --   HGB 10.4*   < > 9.3* 11.1* 11.2*  HCT 34.1*   < > 29.7* 35.2* 33.0*  MCV 78.4*  --  76.5* 76.2*  --   MCH 23.9*  --  24.0* 24.0*  --   MCHC 30.5  --  31.3 31.5  --   RDW 18.2*  --  17.9* 18.6*  --   PLT 419*  --  334 316  --    < > = values in this interval not displayed.   Thyroid  Recent Labs  Lab 03/05/22 2356  TSH 0.835    BNP Recent Labs  Lab 03/07/22 1237  BNP 1,326.2*    DDimer No results for input(s): "DDIMER" in the last 168 hours.   Radiology/Studies:    DG CHEST PORT 1 VIEW Result Date: 03/11/2022 CLINICAL DATA:  76 year old male with history of hypoxia. EXAM: PORTABLE CHEST 1 VIEW COMPARISON:  Chest x-ray 03/07/2022. FINDINGS: Diffuse interstitial prominence and widespread peribronchial cuffing throughout the lungs bilaterally (right greater than left). Mass-like opacity in the perihilar aspect of the right lung with surrounding ill-defined airspace consolidation. No pleural effusions. No pneumothorax. Heart size is normal. IMPRESSION: 1. The appearance the chest is concerning for bronchitis, likely with developing bronchopneumonia in the right middle and/or lower lobes. Mass-like opacity in the perihilar aspect of the right lung is presumably part of this airspace consolidation given  the rapid development compared to the recent prior study. However, underlying neoplasm is difficult to exclude. Followup PA and lateral chest X-ray is recommended in 3-4 weeks following trial of antibiotic therapy to ensure resolution and exclude underlying malignancy. Electronically Signed   By: Vinnie Langton M.D.   On: 03/11/2022 07:09     Assessment and Plan:   VT arrest New CM (unknown etiology) Stroke.   Discussed with him the possibility of an ICD and why, we reviewed the events of this morning. Discussed that there was more evaluation/work up to be dine to better understand his new CM and this mornings events. The patient right now at least does not want to consider any further procedures. No more procedures/surgeries. Discussed the multiple issues with his whipple procedure and he has never really felt recovered from that.  Right now, all he wants is a smoothie.      Risk Assessment/Risk Scores:    For questions or updates, please contact [place] Please consult [email or web address] for contact info under    Signed, Baldwin Jamaica, PA-C  03/11/2022 2:12 PM

## 2022-03-12 ENCOUNTER — Inpatient Hospital Stay (HOSPITAL_COMMUNITY): Payer: No Typology Code available for payment source

## 2022-03-12 DIAGNOSIS — I5041 Acute combined systolic (congestive) and diastolic (congestive) heart failure: Secondary | ICD-10-CM

## 2022-03-12 DIAGNOSIS — I469 Cardiac arrest, cause unspecified: Secondary | ICD-10-CM

## 2022-03-12 DIAGNOSIS — I5021 Acute systolic (congestive) heart failure: Secondary | ICD-10-CM | POA: Diagnosis not present

## 2022-03-12 DIAGNOSIS — I639 Cerebral infarction, unspecified: Secondary | ICD-10-CM | POA: Diagnosis not present

## 2022-03-12 LAB — RENAL FUNCTION PANEL
Albumin: 2.6 g/dL — ABNORMAL LOW (ref 3.5–5.0)
Anion gap: 13 (ref 5–15)
BUN: 36 mg/dL — ABNORMAL HIGH (ref 8–23)
CO2: 22 mmol/L (ref 22–32)
Calcium: 8.2 mg/dL — ABNORMAL LOW (ref 8.9–10.3)
Chloride: 102 mmol/L (ref 98–111)
Creatinine, Ser: 1.44 mg/dL — ABNORMAL HIGH (ref 0.61–1.24)
GFR, Estimated: 50 mL/min — ABNORMAL LOW (ref >60)
Glucose, Bld: 191 mg/dL — ABNORMAL HIGH (ref 70–99)
Phosphorus: 4.4 mg/dL (ref 2.5–4.6)
Potassium: 3.8 mmol/L (ref 3.5–5.1)
Sodium: 137 mmol/L (ref 135–145)

## 2022-03-12 LAB — CBC WITH DIFFERENTIAL/PLATELET
Abs Immature Granulocytes: 0.03 10*3/uL (ref 0.00–0.07)
Basophils Absolute: 0 10*3/uL (ref 0.0–0.1)
Basophils Relative: 0 %
Eosinophils Absolute: 0 10*3/uL (ref 0.0–0.5)
Eosinophils Relative: 0 %
HCT: 34.4 % — ABNORMAL LOW (ref 39.0–52.0)
Hemoglobin: 10.9 g/dL — ABNORMAL LOW (ref 13.0–17.0)
Immature Granulocytes: 1 %
Lymphocytes Relative: 13 %
Lymphs Abs: 0.6 10*3/uL — ABNORMAL LOW (ref 0.7–4.0)
MCH: 24 pg — ABNORMAL LOW (ref 26.0–34.0)
MCHC: 31.7 g/dL (ref 30.0–36.0)
MCV: 75.6 fL — ABNORMAL LOW (ref 80.0–100.0)
Monocytes Absolute: 0.2 10*3/uL (ref 0.1–1.0)
Monocytes Relative: 4 %
Neutro Abs: 3.9 10*3/uL (ref 1.7–7.7)
Neutrophils Relative %: 82 %
Platelets: 310 10*3/uL (ref 150–400)
RBC: 4.55 MIL/uL (ref 4.22–5.81)
RDW: 18.6 % — ABNORMAL HIGH (ref 11.5–15.5)
WBC: 4.7 10*3/uL (ref 4.0–10.5)
nRBC: 0.4 % — ABNORMAL HIGH (ref 0.0–0.2)

## 2022-03-12 LAB — GLUCOSE, CAPILLARY
Glucose-Capillary: 177 mg/dL — ABNORMAL HIGH (ref 70–99)
Glucose-Capillary: 157 mg/dL — ABNORMAL HIGH (ref 70–99)
Glucose-Capillary: 124 mg/dL — ABNORMAL HIGH (ref 70–99)
Glucose-Capillary: 100 mg/dL — ABNORMAL HIGH (ref 70–99)

## 2022-03-12 LAB — MAGNESIUM: Magnesium: 2.3 mg/dL (ref 1.7–2.4)

## 2022-03-12 MED ORDER — NYSTATIN 100000 UNIT/ML MT SUSP
5.0000 mL | Freq: Four times a day (QID) | OROMUCOSAL | Status: DC
  Administered 2022-03-12 – 2022-03-21 (×30): 500000 [IU] via ORAL
  Filled 2022-03-12 (×28): qty 5

## 2022-03-12 MED ORDER — AMIODARONE HCL 200 MG PO TABS: 400.0000 mg | ORAL_TABLET | Freq: Every day | ORAL | Status: DC

## 2022-03-12 MED ORDER — AMIODARONE HCL 200 MG PO TABS
400.0000 mg | ORAL_TABLET | Freq: Two times a day (BID) | ORAL | Status: DC
  Administered 2022-03-12 – 2022-03-21 (×20): 400 mg via ORAL
  Filled 2022-03-12 (×23): qty 2

## 2022-03-12 MED ORDER — LIDOCAINE VISCOUS HCL 2 % MT SOLN
15.0000 mL | Freq: Four times a day (QID) | OROMUCOSAL | Status: DC | PRN
  Administered 2022-03-12: 15 mL via OROMUCOSAL
  Filled 2022-03-12 (×2): qty 15

## 2022-03-12 MED ORDER — SACUBITRIL-VALSARTAN 24-26 MG PO TABS
1.0000 | ORAL_TABLET | Freq: Two times a day (BID) | ORAL | Status: DC
  Administered 2022-03-12 – 2022-03-13 (×2): 1 via ORAL
  Filled 2022-03-12 (×2): qty 1

## 2022-03-12 MED ORDER — GADOBUTROL 1 MMOL/ML IV SOLN
6.0000 mL | Freq: Once | INTRAVENOUS | Status: AC | PRN
  Administered 2022-03-12: 6 mL via INTRAVENOUS

## 2022-03-12 NOTE — Progress Notes (Signed)
[facility]                                                                              William Barron, is a 76 y.o. male, DOB - 12-11-1945, EXH:[id number] Admit date - 03/05/2022    Outpatient Primary MD for the patient is [name], MD  LOS - 6  days  Chief Complaint  Patient presents with   Headache       Brief summary   Patient is a 76 year old male with history of pancreatic CA status post Whipple, prostate CA, multiple myeloma, amyloidosis, dyslipidemia, DM, presented to ED with right-sided headache and blurred vision.  Last known well was in the evening of 03/04/2022.  He woke up on the morning of admission on 12/22 with difficulty seeing out of his right eye.  He went to see an ophthalmologist and was referred to ER CT head showed acute/subacute right occipital CVA Failed swallow evaluation Neurology was consulted, admitted for further workup..  03/11/2022: Events of earlier this morning is noted.  Patient was found unresponsive and pulseless around 4:45 AM.  Patient was found to have gone into VT/VF cardiac arrest in setting of low EF 30 to 35%.  Patient has been successfully resuscitated.  Patient underwent left and right heart cardiac catheterization afterwards.  Electrophysiology team has been consulted and ICD has been discussed with patient.  Patient is now on amiodarone drip.  03/12/2022: Patient seen.  No new complaints.  Patient has not a particularly good historian.  Patient underwent cardiac MRI today.  Cardiology input is highly appreciated.   Assessment & Plan    Principal Problem: Acute right PCA stroke (HCC) right CN VI palsy, chronic -Presented with visual issues, having difficulty seeing half of the clock.  CT head showed right PCA territory stroke. -MRI brain showed acute to early subacute right PCA territory infarct.  Associated mild petechial blood products without frank hemorrhagic transformation or significant residual mass  effect.  Underlying moderate chronic microvascular ischemic disease -CTA head and neck showed acute or early subacute infarct in the right occipital lobe, suspected high-grade stenosis versus occlusion of the distal right PCA. -2D echo: EF of 35 to 40% with global hypokinesis, mild AS, mild pulmonary regurg, mild MR, mild AR (EF 50 to 55% on echo in 11/2021) -Per neuro, continue aspirin 325 mg and Plavix 75 mg DAPT for 3 months given intracranial stenosis/occlusion then aspirin 81 mg alone daily indefinitely  -Lipid panel showed LDL 66, at goal, no need of statin per neurology -Hemoglobin A1c 6.5 -Loop recorder per neuro, timing per cardiology  Active Problems: New onset systolic CHF, EF 35 to 38% -2D echo showed EF of 35 to 40% with global hypokinesis (was 50 to 55% on echo in 11/2021) -Received Lasix 20 mg IV x 1 on 12/24 -BNP 1326.2, received Lasix 20 mg IV x 1 on 12/24, negative balance of 2.4 L -Continue Coreg, losartan 12.5 mg daily, Farxiga 10 mg daily  -Lasix as needed 03/12/2022: Cardiology is managing.  Patient underwent cardiac MRI today.  Input from cardiology and EP teams SLF pressure today.  Influenza A positive -COVID-19 negative, positive  for influenza A - started on Tamiflu 30 mg twice daily, started on 03/07/2022.  Complete 5-day course.   Hypokalemia -Resolved Potassium today is 3.8.  GERD -Continue PPI  Diabetes mellitus type 2, IDDM HbA1c 6.5 Recent Labs    03/11/22 1250 03/11/22 1728 03/11/22 2119 03/12/22 0631 03/12/22 1100 03/12/22 1654  GLUCAP 145* 126* 161* 177* 157* 124*    -CBGs better, continue Semglee 10 units daily, NovoLog 3 units 3 times daily AC, SSI   History of pancreatic CA status post Whipple procedure -Continue Creon 72,000 units 3 times daily with meals -Follows with [name]  History of AL amyloidosis ([place]),  Multiple myeloma ([place]) -Outpatient follow-up with his oncologist, [name] at [facility].  History of  prostate CA -Continue outpatient follow-up with urology, Dr. Jeffie Pollock, completed XRT on 06/02/2020    Lesion of parotid gland -MRI brain incidentally also showed 1.8 cm T2 hypointense lesion within the posterior aspect of the right parotid gland, indeterminate.  Recommended nonemergent outpatient ENT evaluation -CTA showed 1.6 cm partially calcified lesion in the posterior aspect of right parotid gland with multiple surrounding smaller lesion, could potentially represent primary parotid neoplasm benign or malignant or enlarged lymph nodes, recommended outpatient ENT evaluation  Code Status: Full code DVT Prophylaxis:  heparin injection 5,000 Units Start: 03/11/22 2200 SCD's Start: 03/05/22 2235  Level of Care: Level of care: Progressive Family Communication: Discussed with patient Disposition Plan:      Remains inpatient appropriate: PT recommended inpatient rehab.  TOC, CIR assisting and working with wife.  Procedures:  MRI brain, CTA head and neck 2D echo  Consultants:   Neurology Cardiology Electrophysiology.  Antimicrobials: Tamiflu started on 12/24 Medications  amiodarone  400 mg Oral BID   Followed by   Derrill Memo ON 03/26/2022] amiodarone  400 mg Oral Daily   aspirin EC  325 mg Oral Daily   clopidogrel  75 mg Oral Daily   dapagliflozin propanediol  10 mg Oral Daily   heparin  5,000 Units Subcutaneous Q8H   insulin aspart  0-9 Units Subcutaneous TID WC   insulin aspart  3 Units Subcutaneous TID WC   insulin glargine-yfgn  10 Units Subcutaneous Daily   lipase/protease/amylase  72,000 Units Oral TID WC   mirabegron ER  25 mg Oral Daily   nystatin  5 mL Oral QID   oseltamivir  30 mg Oral BID   pantoprazole  40 mg Oral Daily   sacubitril-valsartan  1 tablet Oral BID   sodium chloride flush  3 mL Intravenous Q12H   spironolactone  12.5 mg Oral Daily      Subjective:   -No new complaints today.   Objective:   Vitals:   03/12/22 0000 03/12/22 0400 03/12/22 1103 03/12/22  1651  BP: (!) 139/54 (!) 134/55 (!) 152/51 (!) 133/54  Pulse: 65   67  Resp: _0 Temp: (!) 97.5 F (36.4 C) 97.6 F (36.4 C) 98.5 F (36.9 C) (!) 97.5 F (36.4 C)  TempSrc: Oral Oral Oral Oral  SpO2: 93% 94% 95% 97%  Weight:      Height:        Intake/Output Summary (Last 24 hours) at 03/12/2022 1803 Last data filed at 03/12/2022 0407 Gross per 24 hour  Intake 137.79 ml  Output 900 ml  Net -762.21 ml      Wt Readings from Last 3 Encounters:  03/11/22 58.5 kg  01/28/22 66.3 kg  01/14/22 67.2 kg   MPhysical Exam General:  Alert and oriented x 3, NAD Cardiovascular: S1 S2 clear, RRR.  Respiratory: Fairly CTA B Gastrointestinal: Soft, nontender, nondistended, NBS Neuro: no new deficits    Data Reviewed:  I have personally reviewed following labs    CBC Lab Results  Component Value Date   WBC 4.7 03/12/2022   RBC 4.55 03/12/2022   HGB 10.9 (L) 03/12/2022   HCT 34.4 (L) 03/12/2022   MCV 75.6 (L) 03/12/2022   MCH 24.0 (L) 03/12/2022   PLT 310 03/12/2022   MCHC 31.7 03/12/2022   RDW 18.6 (H) 03/12/2022   LYMPHSABS 0.6 (L) 03/12/2022   MONOABS 0.2 03/12/2022   EOSABS 0.0 03/12/2022   BASOSABS 0.0 61/44/3154     Last metabolic panel Lab Results  Component Value Date   NA 137 03/12/2022   K 3.8 03/12/2022   CL 102 03/12/2022   CO2 22 03/12/2022   BUN 36 (H) 03/12/2022   CREATININE 1.44 (H) 03/12/2022   GLUCOSE 191 (H) 03/12/2022   GFRNONAA 50 (L) 03/12/2022   GFRAA >60 07/13/2018   CALCIUM 8.2 (L) 03/12/2022   PHOS 4.4 03/12/2022   PROT 5.6 (L) 03/06/2022   ALBUMIN 2.6 (L) 03/12/2022   LABGLOB 2.7 06/21/2018   AGRATIO 1.3 06/21/2018   BILITOT 0.5 03/06/2022   ALKPHOS 564 (H) 03/06/2022   AST 28 03/06/2022   ALT 26 03/06/2022   ANIONGAP 13 03/12/2022    CBG (last 3)  Recent Labs    03/12/22 0631 03/12/22 1100 03/12/22 1654  GLUCAP 177* 157* 124*       Coagulation Profile: No results for input(s): "INR", "PROTIME" in the  last 168 hours.    Radiology Studies: I have personally reviewed the imaging studies  CARDIAC CATHETERIZATION  Result Date: 03/11/2022   Dist RCA lesion is 60-70% stenosed.   2nd Diag lesion is 70% stenosed.   Mid LAD lesion is 60% stenosed.   Ost LAD to Prox LAD lesion is 25% stenosed.   Dist LAD lesion is 70% stenosed.   LV end diastolic pressure is normal.   There is no aortic valve stenosis.   On 4L [place]: Ao sat 93%, PA sat 45%, PA pressure 23/5, mean PA pressure 14 mm Hg; mean PCWP 10/12, mean PCWP 9 mm Hg; CO 3.22 L/min; CI 1.87. Moderate focal disease.  Cardiomyopathy is out of proportion to degree of CAD.  LV dysfunction with significant decrease in cardiac output.  Discussed with the wife and son.    ECHOCARDIOGRAM LIMITED  Result Date: 03/11/2022    ECHOCARDIOGRAM LIMITED REPORT   Patient Name:   William Barron Date of Exam: 03/11/2022 Medical Rec #:  [id number]           Height:       65.0 in Accession #:    [id number]          Weight:       142.6 lb Date of Birth:  1945/09/07            BSA:          1.713 m Patient Age:    75 years            BP:           111/63 mmHg Patient Gender: M                   HR:           67 bpm. Exam Location:  Inpatient Procedure: Limited Echo, Cardiac Doppler  and Limited Color Doppler Indications:    Ventricular Tachycardia I47.2  History:        Patient has prior history of Echocardiogram examinations. CHF,                 Stroke, Aortic Valve Disease, Signs/Symptoms:Chest Pain; Risk                 Factors:Dyslipidemia, Former Smoker and Diabetes.  Sonographer:    Greer Pickerel Referring Phys: [id number] [place]  Sonographer Comments: Technically challenging study due to limited acoustic windows. Image acquisition challenging due to patient body habitus and Image acquisition challenging due to respiratory motion. IMPRESSIONS  1. Left ventricular ejection fraction, by estimation, is 30 to 35%. The left ventricle has moderately decreased function.  The left ventricle demonstrates regional wall motion abnormalities (see scoring diagram/findings for description). Left ventricular  diastolic parameters are indeterminate.  2. Right ventricular systolic function is normal. The right ventricular size is normal.  3. A small pericardial effusion is present. The pericardial effusion is circumferential.  4. The mitral valve is normal in structure. Trivial mitral valve regurgitation. No evidence of mitral stenosis.  5. The aortic valve was not well visualized.  6. The inferior vena cava is normal in size with greater than 50% respiratory variability, suggesting right atrial pressure of 3 mmHg. FINDINGS  Left Ventricle: Left ventricular ejection fraction, by estimation, is 30 to 35%. The left ventricle has moderately decreased function. The left ventricle demonstrates regional wall motion abnormalities. Left ventricular diastolic parameters are indeterminate.  LV Wall Scoring: The inferior septum and entire inferior wall are akinetic. The entire anterior wall, entire lateral wall, anterior septum, and apex are hypokinetic. Right Ventricle: The right ventricular size is normal. No increase in right ventricular wall thickness. Right ventricular systolic function is normal. Left Atrium: Left atrial size was not well visualized. Right Atrium: Right atrial size was not well visualized. Pericardium: A small pericardial effusion is present. The pericardial effusion is circumferential. Mitral Valve: The mitral valve is normal in structure. Trivial mitral valve regurgitation. No evidence of mitral valve stenosis. Tricuspid Valve: The tricuspid valve is normal in structure. Tricuspid valve regurgitation is trivial. No evidence of tricuspid stenosis. Aortic Valve: The aortic valve was not well visualized. Pulmonic Valve: The pulmonic valve was not well visualized. Aorta: The aortic root was not well visualized. Venous: The inferior vena cava is normal in size with greater than 50%  respiratory variability, suggesting right atrial pressure of 3 mmHg. Additional Comments: Spectral Doppler performed. Color Doppler performed.   LV Volumes (MOD) LV vol d, MOD A2C: 143.0 ml LV vol d, MOD A4C: 161.0 ml LV vol s, MOD A2C: 89.5 ml LV vol s, MOD A4C: 81.3 ml LV SV MOD A2C:     53.5 ml LV SV MOD A4C:     161.0 ml LV SV MOD BP:      66.0 ml MITRAL VALVE MV Area (PHT): 2.36 cm MV Decel Time: 322 msec MV E velocity: 53.10 cm/s MV A velocity: 67.30 cm/s MV E/A ratio:  0.79 Kardie Tobb DO Electronically signed by Berniece Salines DO Signature Date/Time: 03/11/2022/10:53:18 AM    Final    DG CHEST PORT 1 VIEW  Result Date: 03/11/2022 CLINICAL DATA:  76 year old male with history of hypoxia. EXAM: PORTABLE CHEST 1 VIEW COMPARISON:  Chest x-ray 03/07/2022. FINDINGS: Diffuse interstitial prominence and widespread peribronchial cuffing throughout the lungs bilaterally (right greater than left). Mass-like opacity in the perihilar aspect of the  right lung with surrounding ill-defined airspace consolidation. No pleural effusions. No pneumothorax. Heart size is normal. IMPRESSION: 1. The appearance the chest is concerning for bronchitis, likely with developing bronchopneumonia in the right middle and/or lower lobes. Mass-like opacity in the perihilar aspect of the right lung is presumably part of this airspace consolidation given the rapid development compared to the recent prior study. However, underlying neoplasm is difficult to exclude. Followup PA and lateral chest X-ray is recommended in 3-4 weeks following trial of antibiotic therapy to ensure resolution and exclude underlying malignancy. Electronically Signed   By: Vinnie Langton M.D.   On: 03/11/2022 07:09       Bonnell Public M.D. [facility] 03/12/2022, 6:03 PM  Available via Epic secure chat 7am-7pm After 7 pm, please refer to night coverage provider listed on amion.

## 2022-03-12 NOTE — Consult Note (Addendum)
Advanced Heart Failure Team Consult Note   Primary Physician: [name], MD PCP-Cardiologist:  Minus Breeding, MD  Reason for Consultation: Acute combined systolic and diastolic heart failure, s/p VT arrest, hx AL amyloidosis  HPI:    William Barron is seen today for evaluation of acute combined systolic and diastolic heart failure, s/p VT arrest, hx AL amyloidosis  at the request of Dr. Audie Box, cardiology.   William Barron is a 76 y.o. male with AL amyloidosis, multiple myeloma, nonobstructive CAD, history of prostate cancer and pancreatic cancer status post Whipple, dyslipidemia, and DMII. AHF team asked to see for acute combined systolic and diastolic heart failure, hx AL amyloidosis (treated at [facility]).   In 81' he presented with a inguinal lymph node that was biopsy proven to show amyloid and his laboratory testing showed a IgA kappa monoclonal protein with about 20% plasma cell infiltration in the bone marrow. [place] PET scan (-), therapy stopped with close monitoring. 10/19 he presented with GI amyloidosis that was discovered during routine colonoscopy, patient asymptomatic. Work-up completed and M-spike demonstrating progressive disease. cMRI (-) at this time. He was later diagnosed with Stage T2 adenocarcinoma of the prostate (XRT completed 3/22). Last visit was 03/03/22 this year, he has been off steroids and chemo for a year now d/t weakness/deconditioning.   William Barron presented to his opthalmologist with complaints of blurred vision. He was advised to come to the ED. Where he was found to have right PCA infarct. Out of window for TPA. Neurology followed and is now signed off. Hospitalization complicated by VT arrest. 12/28 had VT arrest requiring 1 min CPR with ROSC. EP consulted, pending possible ICD placement. Echo done showed EF now 30-35% (previously 55-60% in September). L/RHC showed low output HF.   Prior to his stroke he underwent ankle surgery within the last year  and a whipple procedure in May of this year. Since surgery he's been in CIR then SNF then home with his wife. He has been declining significantly since. According to his wife he's unable to use a walker anymore or walk around a grocery store without him feeling weak. He's been intermittently SOB since September. Does "nothing" at home anymore. Used to be able to ride his reclined bike for 10 minutes but isn't even able to get on it anymore.   In bed, denies CP. Chest sore from compressions. SOB intermittent. Having trouble swallowing, speech to see.   Cardiac studies reviewed:  [facility] 03/11/22: Ao sat 93%, PA sat 45%, PA pressure 23/5, mean PA pressure 14 mm Hg; mean PCWP 10/12, mean PCWP 9 mm Hg; CO 3.22 L/min; CI 1.87, Moderate focal disease. Cardiomyopathy is out of proportion to degree of CAD  04/11/21 limited echo: EF 30-35%, normal RV, small pericardial effusion seen, trivial William 03/06/22 echo: EF 30-35%, LV with global hypokinesis, LV mildly dilated, mild LVH, GIIDD, RV normal, LA mildly dilated, small pericardial effusion, mild William 9/23 Echo: EF EF 55-60%, G1DD, normal RV, small pericardial effusion seen 6/22 cMRI: Mild LVH with normal LV function. The region of hyperenhancement in the lateral wall is stable.  The pattern of hyperenhancement is not classic for cardiac amyloid.  3/22: whole body PET CT. No FDG avid lytic osseous lesions to suggest active myeloma. Redemonstrated findings of possible amyloidosis  6//21 PET CT with no FDG avid or lytic osseous lesions. Unchanged partially calcified mediastinal, hilar, and left inguinal lymph nodes with mild FDG uptake   3/15 Cardiac MRI negative for amyloid  involvement.   Home Medications Prior to Admission medications   Medication Sig Start Date End Date Taking? Authorizing Provider  Cyanocobalamin (B-12 PO) Take 1 tablet by mouth in the morning.   Yes [provider]  insulin aspart (NOVOLOG FLEXPEN) 100 UNIT/ML FlexPen Inject 8 Units  into the skin 3 (three) times daily before meals.   Yes [provider]  lipase/protease/amylase (CREON) 36000 UNITS CPEP capsule Take 3 capsules (108,000 Units total) by mouth 3 (three) times daily with meals. May also take 1 capsule (36,000 Units total) as needed (with snacks). Patient taking differently: Take 2-3 capsules (72000-108000 units) by mouth 3 (three) times daily with meals. May also take 1 capsule (36,000 units) as needed (with snacks). 01/14/22  Yes Gatha Mayer, MD  pantoprazole sodium (PROTONIX) 40 mg Take 40 mg by mouth daily. Patient taking differently: Take 40 mg by mouth in the morning. 01/14/22  Yes Gatha Mayer, MD  Testosterone 20.25 MG/ACT (1.62%) GEL Apply 4 Pump topically at bedtime.   Yes [provider]  Vibegron (GEMTESA) 75 MG TABS Take 75 mg by mouth in the morning.   Yes [provider]  BD PEN NEEDLE NANO U/F 32G X 4 MM MISC USE AS DIRECTED WITH INSULIN PEN THREE TIMES DAILY 01/27/22   Elayne Snare, MD  Continuous Blood Gluc Receiver (FREESTYLE LIBRE 2 READER) DEVI Use as instructed to check blood sugars 11/20/21   Elayne Snare, MD  Continuous Blood Gluc Sensor (FREESTYLE LIBRE 3 SENSOR) MISC Place 1 sensor on the skin every 14 days. Use to check glucose continuously 03/05/22   Elayne Snare, MD  insulin degludec (TRESIBA FLEXTOUCH) 100 UNIT/ML FlexTouch Pen Inject 15 Units into the skin daily. Patient taking differently: Inject 6 Units into the skin in the morning. 11/17/21   Elayne Snare, MD    Past Medical History: Past Medical History:  Diagnosis Date   Amyloidosis ([place])    Diabetes mellitus without complication ([place])    Feeding tube dysfunction, initial encounter 08/12/2021   GERD (gastroesophageal reflux disease)    Hearing loss    Has hearing aids   History of kidney stones    Hyperlipidemia    Jejunostomy tube present (H[facility])    Multiple myeloma ([place])    and amylodosis    Nephrolithiasis    Occasional tremors    Pancreatic  insufficiency    Pneumonia    Prostate cancer ([place])    Prostatitis    acute and chronic   Skin cancer     Past Surgical History: Past Surgical History:  Procedure Laterality Date   APPENDECTOMY     CARDIAC CATHETERIZATION N/A 06/13/2015   Procedure: Left Heart Cath and Coronary Angiography;  Surgeon: Jettie Booze, MD;  Location: [place] [facility];  Service: Cardiovascular;  Laterality: N/A;   COLONOSCOPY  04/07/2000   ELBOW SURGERY  03/15/2001   right   ESOPHAGOGASTRODUODENOSCOPY (EGD) WITH PROPOFOL N/A 09/07/2021   Procedure: ESOPHAGOGASTRODUODENOSCOPY (EGD) WITH PROPOFOL;  Surgeon: Irene Shipper, MD;  Location: [place];  Service: Gastroenterology;  Laterality: N/A;   EXTRACORPOREAL SHOCK WAVE LITHOTRIPSY Left 07/17/2018   Procedure: EXTRACORPOREAL SHOCK WAVE LITHOTRIPSY (ESWL);  Surgeon: [name], MD;  Location: [facility];  Service: Urology;  Laterality: Left;   EXTRACORPOREAL SHOCK WAVE LITHOTRIPSY Right 12/08/2020   Procedure: RIGHT EXTRACORPOREAL SHOCK WAVE LITHOTRIPSY (ESWL);  Surgeon: Raynelle Bring, MD;  Location: [facility];  Service: Urology;  Laterality: Right;   FOOT ARTHRODESIS Right 04/15/2021  Procedure: RIGHT SUBTALAR AND TALONAVICULAR FUSION;  Surgeon: Newt Minion, MD;  Location: [place];  Service: Orthopedics;  Laterality: Right;   HERNIA REPAIR     IR MECH REMOV OBSTRUC MAT ANY COLON TUBE W/FLUORO  08/27/2021   IR REPLC GASTRO/COLONIC TUBE PERCUT W/FLUORO  09/08/2021   RIGHT/LEFT HEART CATH AND CORONARY ANGIOGRAPHY N/A 03/11/2022   Procedure: RIGHT/LEFT HEART CATH AND CORONARY ANGIOGRAPHY;  Surgeon: Jettie Booze, MD;  Location: [place] [facility];  Service: Cardiovascular;  Laterality: N/A;   ROTATOR CUFF REPAIR     WHIPPLE PROCEDURE  07/13/2021    Family History: Family History  Problem Relation Age of Onset   Pancreatic cancer Father    Prostate cancer Father    Parkinsonism Mother    CAD Brother 80   Cancer Paternal  Grandmother    Breast cancer Neg Hx    Colon cancer Neg Hx     Social History: Social History   Socioeconomic History   Marital status: Married    Spouse name: Not on file   Number of children: 2   Years of education: Not on file   Highest education level: Not on file  Occupational History   Occupation: retired  Tobacco Use   Smoking status: Former    Packs/day: 3.00    Years: 20.00    Total pack years: 60.00    Types: Cigarettes    Quit date: 03/15/1977    Years since quitting: 45.0   Smokeless tobacco: Never   Tobacco comments:    quit 35 years ago  Vaping Use   Vaping Use: Never used  Substance and Sexual Activity   Alcohol use: Yes    Alcohol/week: 3.0 standard drinks of alcohol    Types: 3 Glasses of wine per week    Comment: approx 1 drink/night   Drug use: No   Sexual activity: Not Currently    Birth control/protection: None  Other Topics Concern   Not on file  Social History Narrative   Married, 2 children   Retired criminal Counsellor and [place]   1 drink/day   Social Determinants of Health   Financial Resource Strain: Not on file  Food Insecurity: No Food Insecurity (03/09/2022)   Hunger Vital Sign    Worried About Running Out of Food in the Last Year: Never true    Ran Out of Food in the Last Year: Never true  Transportation Needs: No Transportation Needs (03/09/2022)   PRAPARE - Hydrologist (Medical): No    Lack of Transportation (Non-Medical): No  Physical Activity: Not on file  Stress: Not on file  Social Connections: Not on file    Allergies:  No Known Allergies  Objective:    Vital Signs:   Temp:  [97.5 F (36.4 C)-97.7 F (36.5 C)] 97.6 F (36.4 C) (12/29 0400) Pulse Rate:  [58-91] 65 (12/29 0000) Resp:  [17-31] 18 (12/29 0400) BP: (115-143)/(47-62) 134/55 (12/29 0400) SpO2:  [88 %-98 %] 94 % (12/29 0400) Last BM Date : 03/07/22  Weight change: Filed Weights    03/07/22 0600 03/08/22 0500 03/11/22 0906  Weight: 63.6 kg 64.7 kg 58.5 kg    Intake/Output:   Intake/Output Summary (Last 24 hours) at 03/12/2022 1006 Last data filed at 03/12/2022 0407 Gross per 24 hour  Intake 3097.5 ml  Output 900 ml  Net 2197.5 ml      Physical Exam  General:  chronically ill, deconditioned, frail appearing.  No respiratory difficulty HEENT: R eye ptosis, hearing aids Neck: supple. JVD ~6 cm. Carotids 2+ bilat; no bruits. No lymphadenopathy or thyromegaly appreciated. Cor: PMI nondisplaced. Regular rate & rhythm. No rubs, gallops or murmurs. Lungs: clear, exp wheeze Abdomen: soft, nontender, nondistended. No hepatosplenomegaly. No bruits or masses. Good bowel sounds. Extremities: no cyanosis, clubbing, rash, edema  Neuro: alert & oriented x 3, cranial nerves grossly intact. moves all 4 extremities w/o difficulty. Affect pleasant.   Telemetry   NSR 60s (Personally reviewed)    EKG    Normal sinus rhythm 69 bpm  Nonspecific T wave abnormality  Labs   Basic Metabolic Panel: Recent Labs  Lab 03/07/22 0723 03/08/22 0303 03/09/22 0435 03/11/22 0215 03/11/22 0539 03/11/22 1205 03/11/22 1214 03/12/22 0250  NA 137 136 135 137 137 136 138  137 137  K 3.4* 3.8 4.0 4.0 3.9 3.9 3.8  3.9 3.8  CL 107 101 99 103 104  --   --  102  CO2 _0 21*  --   --  22  GLUCOSE 184* 143* 156* 130* 175*  --   --  191*  BUN 10 14 24* 31* 33*  --   --  36*  CREATININE 0.98 1.10 0.99 1.32* 1.37*  --   --  1.44*  CALCIUM 8.5* 8.9 8.6* 8.3* 8.2*  --   --  8.2*  MG 1.7  --   --   --  2.3  --   --  2.3  PHOS  --   --   --   --   --   --   --  4.4    Liver Function Tests: Recent Labs  Lab 03/05/22 1632 03/06/22 0615 03/12/22 0250  AST 39 28  --   ALT 33 26  --   ALKPHOS 727* 564*  --   BILITOT 0.4 0.5  --   PROT 7.1 5.6*  --   ALBUMIN 3.5 2.7* 2.6*   No results for input(s): "LIPASE", "AMYLASE" in the last 168 hours. No results for input(s): "AMMONIA"  in the last 168 hours.  CBC: Recent Labs  Lab 03/05/22 1632 03/05/22 1725 03/07/22 0723 03/11/22 0215 03/11/22 1205 03/11/22 1214 03/12/22 0250  WBC 7.0  --  7.0 3.7*  --   --  4.7  NEUTROABS 5.2  --   --   --   --   --  3.9  HGB 10.4*   < > 9.3* 11.1* 11.2* 11.2*  11.2* 10.9*  HCT 34.1*   < > 29.7* 35.2* 33.0* 33.0*  33.0* 34.4*  MCV 78.4*  --  76.5* 76.2*  --   --  75.6*  PLT 419*  --  334 316  --   --  310   < > = values in this interval not displayed.    Cardiac Enzymes: No results for input(s): "CKTOTAL", "CKMB", "CKMBINDEX", "TROPONINI" in the last 168 hours.  BNP: BNP (last 3 results) Recent Labs    03/07/22 1237  BNP 1,326.2*    ProBNP (last 3 results) No results for input(s): "PROBNP" in the last 8760 hours.   CBG: Recent Labs  Lab 03/11/22 0635 03/11/22 1250 03/11/22 1728 03/11/22 2119 03/12/22 0631  GLUCAP 173* 145* 126* 161* 177*    Coagulation Studies: No results for input(s): "LABPROT", "INR" in the last 72 hours.   Imaging   CARDIAC CATHETERIZATION  Result Date: 03/11/2022   Dist RCA lesion is 60-70% stenosed.   2nd Diag  lesion is 70% stenosed.   Mid LAD lesion is 60% stenosed.   Ost LAD to Prox LAD lesion is 25% stenosed.   Dist LAD lesion is 70% stenosed.   LV end diastolic pressure is normal.   There is no aortic valve stenosis.   On 4L [place]: Ao sat 93%, PA sat 45%, PA pressure 23/5, mean PA pressure 14 mm Hg; mean PCWP 10/12, mean PCWP 9 mm Hg; CO 3.22 L/min; CI 1.87. Moderate focal disease.  Cardiomyopathy is out of proportion to degree of CAD.  LV dysfunction with significant decrease in cardiac output.  Discussed with the wife and son.      Medications:     Current Medications:  amiodarone  400 mg Oral BID   Followed by   Derrill Memo ON 03/26/2022] amiodarone  400 mg Oral Daily   aspirin EC  325 mg Oral Daily   clopidogrel  75 mg Oral Daily   dapagliflozin propanediol  10 mg Oral Daily   heparin  5,000 Units Subcutaneous Q8H    insulin aspart  0-9 Units Subcutaneous TID WC   insulin aspart  3 Units Subcutaneous TID WC   insulin glargine-yfgn  10 Units Subcutaneous Daily   lipase/protease/amylase  72,000 Units Oral TID WC   losartan  12.5 mg Oral Daily   mirabegron ER  25 mg Oral Daily   oseltamivir  30 mg Oral BID   pantoprazole  40 mg Oral Daily   sodium chloride flush  3 mL Intravenous Q12H   spironolactone  12.5 mg Oral Daily    Infusions:  sodium chloride      Patient Profile   William Barron is a 76 y.o. male with AL amyloidosis, multiple myeloma, nonobstructive CAD, history of prostate cancer and pancreatic cancer status post Whipple, dyslipidemia, and DMII. AHF team asked to see for acute combined systolic and diastolic heart failure, s/p VT arrest, hx AL amyloidosis (treated at [facility]).    Assessment/Plan  Acute combined systolic and diastolic heart failure - 6/83 Echo: EF EF 55-60%, G1DD, normal RV, small pericardial effusion seen - Echo 12/23 EF 30-35%, LV with global hypokinesis, LV mildly dilated, mild LVH, GIIDD, RV normal, LA mildly dilated, small pericardial effusion, mild William - Suspect drop in EF may be from new cardiac amyloid involvement? recent illness (flu)? cMRI pending. - L/RHC 12/28: Ao sat 93%, PA sat 45%, PA pressure 23/5, mean PA pressure 14 mm Hg; mean PCWP 10/12, mean PCWP 9 mm Hg; CO 3.22 L/min; CI 1.87, w/ Ost LAD to Prox LAD lesion is 25% stenosed, Mid LAD lesion is 60% stenosed. The lesion is eccentric. The lesion is moderately calcified, Dist LAD lesion is 70% stenosed, 2nd Diag lesion is 70% stenosed, Dist RCA lesion is 60% stenosed. The lesion is located at the major branch.  - NYHA III on admission, BNP 1326 12/24 - Volume now stable, Cr. Slightly elevated, got contrast yesterday with cath.  - low output as seen on cath: ideally would place PICC and start inotropic support. Will discuss with MD regarding this with multiple comorbidities and significant functional decline, doubt he  would be a good candidate for long term inotropic support.  - BB held post VT arrest w/ CHB seen on tele, and low output HF - Change losartan 12.5 mg to Entresto 24/26 BID - Continue farxiga 10 mg daily - Continue spironolactone 12.28m daily - If Cr improves can consider digoxin although advanced age  Stroke - Presented with vision loss - CT scan  of the head in the ER demonstrated a acute/subacute right occipital stroke  - CTA head and neck right PCA distal P3 occlusion, left VA origin stenosis.   - MRI confirmed right PCA infarct - neurology now signed off, symptoms resolved - On ASA/Plavix - Loop recorder vs ICD prior to d/c  VT arrest - Had VT vs polymorphic VT arrest yesterday morning, received 1 min CPR with ROSC - amiodarone gtt transitioned to PO - triggered by PVCs, had CHB at one point. Could also be 2/2 cardiac amyloid if present.  - EP following, considering ICD pending cMRI results vs amiodarone suppressive therapy  Mod CAD  - LHC 2017 with non-obstructive CAD - LHC yesterday w/ Ost LAD to Prox LAD lesion is 25% stenosed, Mid LAD lesion is 60% stenosed. The lesion is eccentric. The lesion is moderately calcified, Dist LAD lesion is 70% stenosed, 2nd Diag lesion is 70% stenosed, Dist RCA lesion is 60% stenosed. The lesion is located at the major branch.  - CAD out of proportion to CM - Continue ASA, start statin. 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 Cardiac MRI negative for amyloid involvement.  - 8/15, Initiated weekly VCD (bortezomib 1.5 mg/m2 [place], cyclophosphamide 300 mg/m2 PO), 11/19: Cycle 1 Day 1 Daratumumab  - 6//21: PET CT with no FDG avid or lytic osseous lesions.  Unchanged partially calcified mediastinal, hilar, and left inguinal lymph nodes with mild FDG uptake  - 3/22: whole body PET CT. No FDG avid lytic osseous lesions to suggest active myeloma. Redemonstrated findings of possible amyloidosis - 3/22: Daratumumab/Dexamethasone restarted, 05/27/21: Resume Dara/Ixazomib/Dexa  - Most recent cMRI 6/22: Mild LVH with normal LV function. The region of hyperenhancement in the lateral wall is stable.  The pattern of hyperenhancement is not classic for cardiac amyloid.  - Last seen at [facility] w/ [name].  Has been off steroids and chemo for a year now due to weakness and deconditioning.  - cMRI pending  Influenza A - + 12/24 - per primary, completed tamiflu   6. DMII - per primary   - Hgb A1c 6.5  7. H/o Whipple procedure - Stable. Hx of pancreatic cancer. S/p whipple 5/23.  8. Dyslipidemia - LDL 66, goal <55 - start statin  9. Lesion of parotid gland -MRI brain incidentally showed 1.8 cm T2 hypointense lesion within the posterior aspect of the right parotid gland, indeterminate.  Recommended nonemergent outpatient ENT evaluation -CTA showed 1.6 cm partially calcified lesion in the posterior aspect of right parotid gland with multiple surrounding smaller lesion, could potentially represent primary parotid neoplasm benign or malignant or enlarged lymph nodes, recommended outpatient ENT evaluation  10. Essential tremors - at baseline, has had for a while according to wife  71. Mild dementia - Diagnosed in 2016  11. Deconditioned - Post procedures this year has been steadily declining - Was in CIR then at [facility] for 47 days. Home since September - Wife states she can't handle taking care of him 24/7, will need SNF at discharge - Had lengthy discussion with his wife, he's been through a lot and she has seen a rapid decline over the last couple of months,  previously DNR. Concerned about his quality of life significantly. She states he's  "extremely friable and unsteady".  Length of Stay: [place], NP  03/12/2022, 10:06 AM  Advanced Heart Failure Team Pager [phone] (M-F; 7a - 5p)  Please contact [facility] for night-coverage after hours (4p -7a ) and weekends on [email or web address]  Patient seen and examined with the above-signed Advanced Practice Provider and/or Housestaff. I personally reviewed laboratory data, imaging studies and relevant notes. I independently examined the patient and formulated the important aspects of the plan. I have edited the note to reflect any of my changes or salient points. I have personally discussed the plan with the patient and/or family.  76 y/o male with multiple medical problems including AL amyloid/MM, DM2, tremor and possible pancreatic CA s/p recent Whipple procedure.  I have reviewed his history at length with him and his family as well as by chart review. 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 Diagnosed with AL amyloid/MM in 2014. Treated at [facility] by [name]. Over the summer found to have possible pancreatic CA and underwent complicated Whipple. Path apparently without evidence of CA. Had difficult recovery and spent time in CIR as well as 47 days at [facility]. Has been home for about 4 months.   Over last 4 months he has been mostly WC bound but able to do basic activities. Feels he is steadily getting weaker but rates QoL 5/10.   Recently seen at [facility] and AL Amyloid treatment suspended due to poor functional status. Admitted here with influenza and new stroke with visual problems found to have PCA occlusion.   Yesterday developed VT VT/VF arrest and had brief CPR (was previously DNR but this was rescinded). Echo with EF ~35%. Cath with non-obstructive CAD and low output PA sat 45% CI 1.8. PCWP 9   cMRI today preliminary EF ~35% + focal myocarditis basilar inferolateral wall No evidence of amyloid  Currently very weak. Falls asleep during conversation. Denies CP or  resting SOB.   General:  Weak appearing. No resp difficulty HEENT: normal Neck: supple. no JVD. Carotids 2+ bilat; no bruits. No lymphadenopathy or thryomegaly appreciated. Cor: PMI nondisplaced. Regular rate & rhythm. 2/6 SEM at RUSB Lungs: clear Abdomen: soft, nontender, nondistended. No hepatosplenomegaly. No bruits or masses. Good bowel sounds. Extremities: no cyanosis, clubbing, rash, edema Neuro: alert & orientedx3, cranial nerves grossly intact. Weak + diffuse tremor  Long talk with him and his family wife and son, [name] both who joined by phone) about my concern over his cardiac situation particularly in light of his progressive general functional decline.   We discussed 3 options 1) Aggressive care with inotropic support 2) Supportive medical care with a watchful waiting approach to see if he can bounce back 3) Palliative/hospice route  Currently they favor option 2. However they would also like to involve Palliative Care team for support. Their son, [name] will be flying in from [place] to help.   We also discussed code status and I suggested that he reconsider returning to DNR/DNI status as he would likely not do well if he were to suffer another arrest. They want to think about this. Given his overall prognosis, he is not ICD candidate.   We will titrate GDMT as tolerated and follow with you.   Time spent 75 minutes.   Glori Bickers, MD  7:02 PM

## 2022-03-12 NOTE — Progress Notes (Addendum)
Physical Therapy Treatment Patient Details Name: William Barron MRN: [id number] DOB: Aug 28, 1945 Today's Date: 03/12/2022   History of Present Illness 76 year old male admitted 12/22 with right-sided headache and blurred vision. +Acute right PCA stroke. During hospitalization pt found to be Flu + on 03/07/22. Pt also had code blue called on 03/11/22. Pt received compressions before regaining ROSC s/p VT arrest. Pt underwent cardiac cath on 03/11/22. PMHX:  amyloidosis, multiple myeloma, history of prostate cancer, history of pancreatic cancer status post Whipple, dyslipidemia, diabetes.    PT Comments    Pt seen for PT tx with pt requiring encouragement for participation & OOB mobility. Pt is able to complete supine>sit with bed rails & HOB elevated with extra time to complete & cuing for each step. Pt transfers STS x 2 with RW & mod assist & stand pivot with RW & max assist. Pt requires cuing for all sequencing/steps & assistance for RW management. Pt fatigues quickly & SpO2 does drop with transfer but pt able to recover with rest. Pt declined further mobility attempts 2/2 fatigue. Will continue to follow pt acutely to address activity tolerance, strengthening, and transfers & gait with LRAD.  Updated d/c recommendations to SNF, as unsure that pt can tolerate 3 hours of therapy/daily in CIR setting at this time.    Recommendations for follow up therapy are one component of a multi-disciplinary discharge planning process, led by the attending physician.  Recommendations may be updated based on patient status, additional functional criteria and insurance authorization.  Follow Up Recommendations  Skilled nursing-short term rehab (<3 hours/day)   Can patient physically be transported by private vehicle: No   Assistance Recommended at Discharge Intermittent Supervision/Assistance  Patient can return home with the following A lot of help with walking and/or transfers;A little help with  bathing/dressing/bathroom;Assistance with cooking/housework;Help with stairs or ramp for entrance;Assist for transportation   Equipment Recommendations  None recommended by PT    Recommendations for Other Services Rehab consult     Precautions / Restrictions Precautions Precautions: Fall Precaution Comments: vision impaired Other Brace: RLE AFO and shoes Restrictions Weight Bearing Restrictions: No     Mobility  Bed Mobility Overal bed mobility: Needs Assistance Bed Mobility: Supine to Sit     Supine to sit: Supervision, HOB elevated     General bed mobility comments: extra time but pt able to transfer supine>sit with [facility] elevated & supervision, cuing for each step though    Transfers Overall transfer level: Needs assistance Equipment used: Rolling walker (2 wheels) Transfers: Sit to/from Stand, Bed to chair/wheelchair/BSC Sit to Stand: Mod assist   Step pivot transfers: Max assist (Assistance for RW management, cuing for sequencing & to reach back for recliner prior to sitting.)       General transfer comment: STS from EOB x 2 with cuing for hand placement, foot placement, after scooting out to edge of seat with assistance to power up to standing.    Ambulation/Gait                   Stairs             Wheelchair Mobility    Modified Rankin (Stroke Patients Only)       Balance Overall balance assessment: Needs assistance Sitting-balance support: No upper extremity supported, Feet supported Sitting balance-Leahy Scale: Fair Sitting balance - Comments: supervision static sitting   Standing balance support: During functional activity, Reliant on assistive device for balance, Bilateral upper extremity supported Standing balance-Leahy Scale: Poor  Cognition Arousal/Alertness: Awake/alert Behavior During Therapy: Flat affect Overall Cognitive Status: No family/caregiver present to determine baseline  cognitive functioning                         Following Commands: Follows one step commands with increased time       General Comments: Requires encouragement/education re: purpose of participation & OOB mobility.        Exercises      General Comments General comments (skin integrity, edema, etc.): Pt on 3.5L/min with SPO2 dropping to 85% after transfer, does recover to >/= 90% with rest & cuing for pursed lip breathing.      Pertinent Vitals/Pain Pain Assessment Pain Assessment: No/denies pain    Home Living                          Prior Function            PT Goals (current goals can now be found in the care plan section) Acute Rehab PT Goals Patient Stated Goal: get stronger PT Goal Formulation: With patient/family Time For Goal Achievement: 03/20/22 Potential to Achieve Goals: Good Progress towards PT goals: Progressing toward goals    Frequency    Min 4X/week      PT Plan Discharge plan needs to be updated    Co-evaluation              AM-PAC PT "6 Clicks" Mobility   Outcome Measure  Help needed turning from your back to your side while in a flat bed without using bedrails?: A Little Help needed moving from lying on your back to sitting on the side of a flat bed without using bedrails?: A Little Help needed moving to and from a bed to a chair (including a wheelchair)?: A Lot Help needed standing up from a chair using your arms (e.g., wheelchair or bedside chair)?: A Lot Help needed to walk in hospital room?: Total Help needed climbing 3-5 steps with a railing? : Total 6 Click Score: 12    End of Session Equipment Utilized During Treatment: Gait belt;Oxygen Activity Tolerance: Patient tolerated treatment well;Patient limited by fatigue Patient left: in chair;with chair alarm set;with call bell/phone within reach Nurse Communication: Mobility status PT Visit Diagnosis: Unsteadiness on feet (R26.81);Other abnormalities of  gait and mobility (R26.89);Muscle weakness (generalized) (M62.81);History of falling (Z91.81);Difficulty in walking, not elsewhere classified (R26.2);Other symptoms and signs involving the nervous system (R29.898)     Time: 2257-5051 PT Time Calculation (min) (ACUTE ONLY): 23 min  Charges:  $Therapeutic Activity: 8-22 mins $Neuromuscular Re-education: 8-22 mins                     Lavone Nian, PT, DPT 03/12/22, 3:53 PM  Waunita Schooner 03/12/2022, 3:52 PM

## 2022-03-12 NOTE — Progress Notes (Addendum)
Cardiology Progress Note  Patient ID: William Barron MRN: [id number] DOB: [birth year] Date of Encounter: 03/12/2022  Primary Cardiologist: Minus Breeding, MD  Subjective   Chief Complaint: SOB  HPI: Left heart catheterization with mild to moderate CAD.  No culprit lesion for VT VF arrest.  Euvolemic on exam and by right heart catheterization.  PA saturation marginal.  Cardiac MRI pending.  ROS:  All other ROS reviewed and negative. Pertinent positives noted in the HPI.     Inpatient Medications  Scheduled Meds:  amiodarone  400 mg Oral BID   Followed by   Derrill Memo ON 03/26/2022] amiodarone  400 mg Oral Daily   aspirin EC  325 mg Oral Daily   clopidogrel  75 mg Oral Daily   dapagliflozin propanediol  10 mg Oral Daily   heparin  5,000 Units Subcutaneous Q8H   insulin aspart  0-9 Units Subcutaneous TID WC   insulin aspart  3 Units Subcutaneous TID WC   insulin glargine-yfgn  10 Units Subcutaneous Daily   lipase/protease/amylase  72,000 Units Oral TID WC   losartan  12.5 mg Oral Daily   mirabegron ER  25 mg Oral Daily   pantoprazole  40 mg Oral Daily   sodium chloride flush  3 mL Intravenous Q12H   spironolactone  12.5 mg Oral Daily   Continuous Infusions:  sodium chloride     PRN Meds: sodium chloride, acetaminophen **OR** acetaminophen (TYLENOL) oral liquid 160 mg/5 mL **OR** acetaminophen, ipratropium-albuterol, sodium chloride flush   Vital Signs   Vitals:   03/11/22 1600 03/11/22 2000 03/12/22 0000 03/12/22 0400  BP: (!) 119/48 (!) 141/58 (!) 139/54 (!) 134/55  Pulse: 65 68 65   Resp: _0 Temp:  97.7 F (36.5 C) (!) 97.5 F (36.4 C) 97.6 F (36.4 C)  TempSrc:  Oral Oral Oral  SpO2:  92% 93% 94%  Weight:      Height:        Intake/Output Summary (Last 24 hours) at 03/12/2022 1100 Last data filed at 03/12/2022 0407 Gross per 24 hour  Intake 3097.5 ml  Output 900 ml  Net 2197.5 ml      03/11/2022    9:06 AM 03/08/2022    5:00 AM 03/07/2022     6:00 AM  Last 3 Weights  Weight (lbs) 128 lb 14.4 oz 142 lb 10.2 oz 140 lb 3.4 oz  Weight (kg) 58.469 kg 64.7 kg 63.6 kg      Telemetry  Overnight telemetry shows SR 70s, which I personally reviewed.   Physical Exam   Vitals:   03/11/22 1600 03/11/22 2000 03/12/22 0000 03/12/22 0400  BP: (!) 119/48 (!) 141/58 (!) 139/54 (!) 134/55  Pulse: 65 68 65   Resp: _1 Temp:  97.7 F (36.5 C) (!) 97.5 F (36.4 C) 97.6 F (36.4 C)  TempSrc:  Oral Oral Oral  SpO2:  92% 93% 94%  Weight:      Height:        Intake/Output Summary (Last 24 hours) at 03/12/2022 1100 Last data filed at 03/12/2022 0407 Gross per 24 hour  Intake 3097.5 ml  Output 900 ml  Net 2197.5 ml       03/11/2022    9:06 AM 03/08/2022    5:00 AM 03/07/2022    6:00 AM  Last 3 Weights  Weight (lbs) 128 lb 14.4 oz 142 lb 10.2 oz 140 lb 3.4 oz  Weight (kg) 58.469 kg 64.7 kg 63.6 kg  Body mass index is 21.45 kg/m.  General: Well nourished, well developed, in no acute distress Head: Atraumatic, normal size  Eyes: PEERLA, EOMI  Neck: Supple, no JVD Endocrine: No thryomegaly Cardiac: Normal S1, S2; RRR; no murmurs, rubs, or gallops Lungs: Diminished breath sounds bilaterally Abd: Soft, nontender, no hepatomegaly  Ext: No edema, pulses 2+ Musculoskeletal: No deformities, BUE and BLE strength normal and equal Skin: Warm and dry, no rashes   Neuro: Alert and oriented to person, place, time, and situation, CNII-XII grossly intact, no focal deficits  Psych: Normal mood and affect   Labs  High Sensitivity Troponin:   Recent Labs  Lab 03/11/22 0539 03/11/22 0721  TROPONINIHS 152* 144*     Cardiac EnzymesNo results for input(s): "TROPONINI" in the last 168 hours. No results for input(s): "TROPIPOC" in the last 168 hours.  Chemistry Recent Labs  Lab 03/05/22 1632 03/05/22 1725 03/06/22 0615 03/07/22 0723 03/11/22 0215 03/11/22 0539 03/11/22 1205 03/11/22 1214 03/12/22 0250  NA 139   < > 138    < > 137 137 136 138  137 137  K 3.2*   < > 3.3*   < > 4.0 3.9 3.9 3.8  3.9 3.8  CL 106   < > 108   < > 103 104  --   --  102  CO2 23  --  22   < > 24 21*  --   --  22  GLUCOSE 128*   < > 63*   < > 130* 175*  --   --  191*  BUN 13   < > 10   < > 31* 33*  --   --  36*  CREATININE 0.98   < > 0.89   < > 1.32* 1.37*  --   --  1.44*  CALCIUM 9.1  --  8.5*   < > 8.3* 8.2*  --   --  8.2*  PROT 7.1  --  5.6*  --   --   --   --   --   --   ALBUMIN 3.5  --  2.7*  --   --   --   --   --  2.6*  AST 39  --  28  --   --   --   --   --   --   ALT 33  --  26  --   --   --   --   --   --   ALKPHOS 727*  --  564*  --   --   --   --   --   --   BILITOT 0.4  --  0.5  --   --   --   --   --   --   GFRNONAA >60  --  >60   < > 55* 53*  --   --  50*  ANIONGAP 10  --  8   < > 10 12  --   --  13   < > = values in this interval not displayed.    Hematology Recent Labs  Lab 03/07/22 0723 03/11/22 0215 03/11/22 1205 03/11/22 1214 03/12/22 0250  WBC 7.0 3.7*  --   --  4.7  RBC 3.88* 4.62  --   --  4.55  HGB 9.3* 11.1* 11.2* 11.2*  11.2* 10.9*  HCT 29.7* 35.2* 33.0* 33.0*  33.0* 34.4*  MCV 76.5* 76.2*  --   --  75.6*  MCH 24.0* 24.0*  --   --  24.0*  MCHC 31.3 31.5  --   --  31.7  RDW 17.9* 18.6*  --   --  18.6*  PLT 334 316  --   --  310   BNP Recent Labs  Lab 03/07/22 1237  BNP 1,326.2*    DDimer No results for input(s): "DDIMER" in the last 168 hours.   Radiology  CARDIAC CATHETERIZATION  Result Date: 03/11/2022   Dist RCA lesion is 60-70% stenosed.   2nd Diag lesion is 70% stenosed.   Mid LAD lesion is 60% stenosed.   Ost LAD to Prox LAD lesion is 25% stenosed.   Dist LAD lesion is 70% stenosed.   LV end diastolic pressure is normal.   There is no aortic valve stenosis.   On 4L [place]: Ao sat 93%, PA sat 45%, PA pressure 23/5, mean PA pressure 14 mm Hg; mean PCWP 10/12, mean PCWP 9 mm Hg; CO 3.22 L/min; CI 1.87. Moderate focal disease.  Cardiomyopathy is out of proportion to degree of CAD.  LV  dysfunction with significant decrease in cardiac output.  Discussed with the wife and son.    ECHOCARDIOGRAM LIMITED  Result Date: 03/11/2022    ECHOCARDIOGRAM LIMITED REPORT   Patient Name:   William Barron Date of Exam: 03/11/2022 Medical Rec #:  [id number]           Height:       65.0 in Accession #:    [id number]          Weight:       142.6 lb Date of Birth:  [birth year]            BSA:          1.713 m Patient Age:    33 years            BP:           111/63 mmHg Patient Gender: M                   HR:           67 bpm. Exam Location:  Inpatient Procedure: Limited Echo, Cardiac Doppler and Limited Color Doppler Indications:    Ventricular Tachycardia I47.2  History:        Patient has prior history of Echocardiogram examinations. CHF,                 Stroke, Aortic Valve Disease, Signs/Symptoms:Chest Pain; Risk                 Factors:Dyslipidemia, Former Smoker and Diabetes.  Sonographer:    Greer Pickerel Referring Phys: [id number] [place]  Sonographer Comments: Technically challenging study due to limited acoustic windows. Image acquisition challenging due to patient body habitus and Image acquisition challenging due to respiratory motion. IMPRESSIONS  1. Left ventricular ejection fraction, by estimation, is 30 to 35%. The left ventricle has moderately decreased function. The left ventricle demonstrates regional wall motion abnormalities (see scoring diagram/findings for description). Left ventricular  diastolic parameters are indeterminate.  2. Right ventricular systolic function is normal. The right ventricular size is normal.  3. A small pericardial effusion is present. The pericardial effusion is circumferential.  4. The mitral valve is normal in structure. Trivial mitral valve regurgitation. No evidence of mitral stenosis.  5. The aortic valve was not well visualized.  6. The inferior vena cava is normal in size with greater than 50% respiratory  variability, suggesting right atrial  pressure of 3 mmHg. FINDINGS  Left Ventricle: Left ventricular ejection fraction, by estimation, is 30 to 35%. The left ventricle has moderately decreased function. The left ventricle demonstrates regional wall motion abnormalities. Left ventricular diastolic parameters are indeterminate.  LV Wall Scoring: The inferior septum and entire inferior wall are akinetic. The entire anterior wall, entire lateral wall, anterior septum, and apex are hypokinetic. Right Ventricle: The right ventricular size is normal. No increase in right ventricular wall thickness. Right ventricular systolic function is normal. Left Atrium: Left atrial size was not well visualized. Right Atrium: Right atrial size was not well visualized. Pericardium: A small pericardial effusion is present. The pericardial effusion is circumferential. Mitral Valve: The mitral valve is normal in structure. Trivial mitral valve regurgitation. No evidence of mitral valve stenosis. Tricuspid Valve: The tricuspid valve is normal in structure. Tricuspid valve regurgitation is trivial. No evidence of tricuspid stenosis. Aortic Valve: The aortic valve was not well visualized. Pulmonic Valve: The pulmonic valve was not well visualized. Aorta: The aortic root was not well visualized. Venous: The inferior vena cava is normal in size with greater than 50% respiratory variability, suggesting right atrial pressure of 3 mmHg. Additional Comments: Spectral Doppler performed. Color Doppler performed.   LV Volumes (MOD) LV vol d, MOD A2C: 143.0 ml LV vol d, MOD A4C: 161.0 ml LV vol s, MOD A2C: 89.5 ml LV vol s, MOD A4C: 81.3 ml LV SV MOD A2C:     53.5 ml LV SV MOD A4C:     161.0 ml LV SV MOD BP:      66.0 ml MITRAL VALVE MV Area (PHT): 2.36 cm MV Decel Time: 322 msec MV E velocity: 53.10 cm/s MV A velocity: 67.30 cm/s MV E/A ratio:  0.79 Kardie Tobb DO Electronically signed by Berniece Salines DO Signature Date/Time: 03/11/2022/10:53:18 AM    Final    DG CHEST PORT 1  VIEW  Result Date: 03/11/2022 CLINICAL DATA:  76 year old male with history of hypoxia. EXAM: PORTABLE CHEST 1 VIEW COMPARISON:  Chest x-ray 03/07/2022. FINDINGS: Diffuse interstitial prominence and widespread peribronchial cuffing throughout the lungs bilaterally (right greater than left). Mass-like opacity in the perihilar aspect of the right lung with surrounding ill-defined airspace consolidation. No pleural effusions. No pneumothorax. Heart size is normal. IMPRESSION: 1. The appearance the chest is concerning for bronchitis, likely with developing bronchopneumonia in the right middle and/or lower lobes. Mass-like opacity in the perihilar aspect of the right lung is presumably part of this airspace consolidation given the rapid development compared to the recent prior study. However, underlying neoplasm is difficult to exclude. Followup PA and lateral chest X-ray is recommended in 3-4 weeks following trial of antibiotic therapy to ensure resolution and exclude underlying malignancy. Electronically Signed   By: Vinnie Langton M.D.   On: 03/11/2022 07:09    Cardiac Studies  TTE 03/11/2022  1. Left ventricular ejection fraction, by estimation, is 30 to 35%. The  left ventricle has moderately decreased function. The left ventricle  demonstrates regional wall motion abnormalities (see scoring  diagram/findings for description). Left ventricular   diastolic parameters are indeterminate.   2. Right ventricular systolic function is normal. The right ventricular  size is normal.   3. A small pericardial effusion is present. The pericardial effusion is  circumferential.   4. The mitral valve is normal in structure. Trivial mitral valve  regurgitation. No evidence of mitral stenosis.   5. The aortic valve was not well visualized.  6. The inferior vena cava is normal in size with greater than 50%  respiratory variability, suggesting right atrial pressure of 3 mmHg.   LHC/RHC 03/11/2022  Dist RCA  lesion is 60-70% stenosed.   2nd Diag lesion is 70% stenosed.   Mid LAD lesion is 60% stenosed.   Ost LAD to Prox LAD lesion is 25% stenosed.   Dist LAD lesion is 70% stenosed.   LV end diastolic pressure is normal.   There is no aortic valve stenosis.   On 4L [place]: Ao sat 93%, PA sat 45%, PA pressure 23/5, mean PA pressure 14 mm Hg; mean PCWP 10/12, mean PCWP 9 mm Hg; CO 3.22 L/min; CI 1.87.   Moderate focal disease.  Cardiomyopathy is out of proportion to degree of CAD.     LV dysfunction with significant decrease in cardiac output.  Discussed with the wife and son.    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 Cardiology consulted due to newly reduced ejection fraction.  Course complicated by VT VF arrest on 03/11/2022.  Assessment & Plan   # VT/VF arrest -Suffered ventricular tachycardia arrest with what appears to be also VF.  This occurred on 03/11/2022.  He received 1 minute of chest compressions with return of spontaneous circulation.  No defibrillation was required. -Left heart catheterization with mild to moderate CAD.  No focal lesion was identified to explain his VT/VF. -His kidney function is stable. -His cardiac arrest could be related to his underlying cardiomyopathy.  He does have a history of AL amyloidosis and multiple myeloma.  Cardiac MRI performed this morning.  We are awaiting results. -He may need an ICD for secondary prevention however if he has amyloid involvement in his heart amiodarone therapy may be the best option.  EP is following. -Transition to oral amiodarone today.  400 mg twice daily for 10 to 14 days.  Then 400 mg daily.  #AKI -Secondary to cardiac arrest.  Stable.  # Moderate diffuse CAD -No symptoms of angina.  Currently  on aspirin and Plavix per neurology.  He refuses statin therapy.  # Acute systolic heart failure, EF 35-40% -Mild to moderate CAD.  No obstructive lesions identified. -History of AL amyloidosis and multiple myeloma.  Cardiac MRI is pending this morning. -Right heart catheterization yesterday shows he is euvolemic.  PA sat 48%.  Holding beta-blocker. -Hold dig at this time given AKI. -Continue Aldactone and losartan.  He is on an SGLT2 inhibitor. -I have asked the advanced heart failure team to evaluate him.  I am a bit worried about his low PA sat.  If he does have amyloid involvement his prognosis may be very poor.  # Acute right PCA stroke # Chronic cerebral microvascular disease -Initially admitted with stroke.  Plan is for aspirin and Plavix per neurology. -May need ICD.  See discussion above.  # Influenza A # Right lower lobe pneumonia -Antibiotics per hospital team.  # Pancreatic cancer status post Whipple # AL amyloidosis/multiple myeloma # GI amyloidosis -Long history of this.  Followed at [facility].  Status post Whipple this summer. -Repeat cardiac MRI as above. -Currently on chemotherapy and follows with [name] oncology.  No mention of prognosis.  Should be considered before ICD is in place.  I believe the cardiac MRI will help [place] immensely.    For  questions or updates, please contact [place] Please consult [email or web address] for contact info under        Signed, [place] T. Audie Box, MD, [place]  03/12/2022 11:00 AM

## 2022-03-12 NOTE — Progress Notes (Signed)
Dr. Audie Box made me aware that his MRI is c/w myocarditis and no amyloid involvement. In d/w Dr. Quentin Ore, agree with his plans of management initially to continue with amiodarone therapy and lifevest and follow him clinically for recovery of his EF/myocarditis. EP service will stand by though remain available. Please recall if needed  Tommye Standard, PA-C

## 2022-03-12 NOTE — Evaluation (Signed)
Clinical/Bedside Swallow Evaluation Patient Details  Name: William Barron MRN: [id number] Date of Birth: 13-Aug-1945  Today's Date: 03/12/2022 Time: SLP Start Time (ACUTE ONLY): 17 SLP Stop Time (ACUTE ONLY): 1330 SLP Time Calculation (min) (ACUTE ONLY): 27 min  Past Medical History:  Past Medical History:  Diagnosis Date   Amyloidosis ([place])    Diabetes mellitus without complication ([place])    Feeding tube dysfunction, initial encounter 08/12/2021   GERD (gastroesophageal reflux disease)    Hearing loss    Has hearing aids   History of kidney stones    Hyperlipidemia    Jejunostomy tube present ([place])    Multiple myeloma ([place])    and amylodosis    Nephrolithiasis    Occasional tremors    Pancreatic insufficiency    Pneumonia    Prostate cancer ([place])    Prostatitis    acute and chronic   Skin cancer    Past Surgical History:  Past Surgical History:  Procedure Laterality Date   APPENDECTOMY     CARDIAC CATHETERIZATION N/A 06/13/2015   Procedure: Left Heart Cath and Coronary Angiography;  Surgeon: Jettie Booze, MD;  Location: [facility];  Service: Cardiovascular;  Laterality: N/A;   COLONOSCOPY  04/07/2000   ELBOW SURGERY  03/15/2001   right   ESOPHAGOGASTRODUODENOSCOPY (EGD) WITH PROPOFOL N/A 09/07/2021   Procedure: ESOPHAGOGASTRODUODENOSCOPY (EGD) WITH PROPOFOL;  Surgeon: Irene Shipper, MD;  Location: [facility];  Service: Gastroenterology;  Laterality: N/A;   EXTRACORPOREAL SHOCK WAVE LITHOTRIPSY Left 07/17/2018   Procedure: EXTRACORPOREAL SHOCK WAVE LITHOTRIPSY (ESWL);  Surgeon: [name], MD;  [facility];  Service: Urology;  Laterality: Left;   EXTRACORPOREAL SHOCK WAVE LITHOTRIPSY Right 12/08/2020   Procedure: RIGHT EXTRACORPOREAL SHOCK WAVE LITHOTRIPSY (ESWL);  Surgeon: Raynelle Bring, MD;  Location: [facility];  Service: Urology;  Laterality: Right;   FOOT ARTHRODESIS Right 04/15/2021   Procedure: RIGHT SUBTALAR AND  TALONAVICULAR FUSION;  Surgeon: Newt Minion, MD;  Location: [facility];  Service: Orthopedics;  Laterality: Right;   HERNIA REPAIR     IR MECH REMOV OBSTRUC MAT ANY COLON TUBE W/FLUORO  08/27/2021   IR REPLC GASTRO/COLONIC TUBE PERCUT W/FLUORO  09/08/2021   RIGHT/LEFT HEART CATH AND CORONARY ANGIOGRAPHY N/A 03/11/2022   Procedure: RIGHT/LEFT HEART CATH AND CORONARY ANGIOGRAPHY;  Surgeon: Jettie Booze, MD;  Location: [facility];  Service: Cardiovascular;  Laterality: N/A;   ROTATOR CUFF REPAIR     WHIPPLE PROCEDURE  07/13/2021   HPI:  76 year old male with history of nonobstructive CAD, mild to moderate aortic stenosis, moderate pulmonary regurgitation, pancreatic CA s/p whipple, diabetes, 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 On 12/28 (early morning), pt"Went into ventricular tachycardia versus polymorphic and received 1 minute of chest compressions.  He was pulseless.  He did not require defibrillation.  ROSC was achieved within 1 minute.  -Unclear etiology.  Lactic acid is normal.  Electrolytes are normal.  Kidney function is stable.  He has been admitted with stroke but is recovering.  Also complicated by influenza A.  He also has a new onset cardiomyopathy so clearly the combination of influenza and congestive heart failure could have resulted in ventricular arrhythmias"per cardiology note.  CXR indicated RLL PNA which could have been a result of aspiration.  BSE generated to assess swallow function.    Assessment / Plan / Recommendation  Clinical Impression  Pt seen for clinical swallowing evaluation d/t complaints of  pain with swallowing (odynophagia) and nursing stating he is refusing oral medication.  Pt assessed with thin via straw, puree and soft solids (pears in juice) with grimacing noted during each swallow and oral holding prior to swallow.  A slight delay in the initiation of the swallow due to odynophagia  intermittently observed.  Pt actively has thrush (per nursing report/OME) which is likely the culprit for pain during swallowing.  Pt did not exhibit any overt s/sx of aspiration during any consumption during this assessment.  Recommend continue pt preferred regular solids/thin liquids while candida persists.  Recommend medications either small with liquids or larger whole in applesauce for ease of transition.  ST will follow briefly for diet tolerance.  Thank you for this consult. SLP Visit Diagnosis: Dysphagia, unspecified (R13.10)    Aspiration Risk  Mild aspiration risk    Diet Recommendation   Regular/thin liquids (pt preferred)  Medication Administration: Whole meds with liquid (or with puree if larger)    Other  Recommendations Oral Care Recommendations: Oral care BID;Patient independent with oral care    Recommendations for follow up therapy are one component of a multi-disciplinary discharge planning process, led by the attending physician.  Recommendations may be updated based on patient status, additional functional criteria and insurance authorization.  Follow up Recommendations No SLP follow up      Assistance Recommended at Discharge  TBD  Functional Status Assessment Patient has had a recent decline in their functional status and demonstrates the ability to make significant improvements in function in a reasonable and predictable amount of time.  Frequency and Duration min 1 x/week  1 week       Prognosis Prognosis for Safe Diet Advancement: Good      Swallow Study   General Date of Onset: 03/05/22 HPI: 76 year old male with history of nonobstructive CAD, mild to moderate aortic stenosis, moderate pulmonary regurgitation, pancreatic CA s/p whipple, diabetes, 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 On 12/28 (early morning), pt"Went into ventricular tachycardia versus polymorphic and received 1 minute of chest  compressions.  He was pulseless.  He did not require defibrillation.  ROSC was achieved within 1 minute.  -Unclear etiology.  Lactic acid is normal.  Electrolytes are normal.  Kidney function is stable.  He has been admitted with stroke but is recovering.  Also complicated by influenza A.  He also has a new onset cardiomyopathy so clearly the combination of influenza and congestive heart failure could have resulted in ventricular arrhythmias"per cardiology note.  CXR indicated RLL PNA which could have been a result of aspiration.  BSE generated to assess swallow function. Type of Study: Bedside Swallow Evaluation Previous Swallow Assessment: n/a Diet Prior to this Study: Regular;Thin liquids Temperature Spikes Noted: No Respiratory Status: Nasal cannula History of Recent Intubation: No Behavior/Cognition: Alert;Cooperative Oral Cavity Assessment: Within Functional Limits Oral Care Completed by SLP: No Oral Cavity - Dentition: Adequate natural dentition Vision: Functional for self-feeding Self-Feeding Abilities: Able to feed self;Needs assist Patient Positioning: Upright in bed Baseline Vocal Quality: Low vocal intensity Volitional Cough: Strong Volitional Swallow: Able to elicit    Oral/Motor/Sensory Function Overall Oral Motor/Sensory Function: Within functional limits   Ice Chips Ice chips: Not tested   Thin Liquid Thin Liquid: Impaired Presentation: Straw Oral Phase Impairments: Other (comment) (grimacing d/t odynophagia) Oral Phase Functional Implications: Oral holding;Other (comment) (d/t odynophagia) Pharyngeal  Phase Impairments: Suspected delayed Swallow Other Comments: Impact of thrush delaying swallow  Nectar Thick Nectar Thick Liquid: Not tested   Honey Thick Honey Thick Liquid: Not tested   Puree Puree: Impaired Presentation: Spoon;Self Fed Oral Phase Impairments: Other (comment) (odynophagia) Oral Phase Functional Implications: Oral holding   Solid     Solid:  Impaired Presentation: Self Fed Oral Phase Impairments: Other (comment) (odynophagia) Oral Phase Functional Implications: Prolonged oral transit Pharyngeal Phase Impairments: Suspected delayed Swallow      Elvina Sidle, M.S., CCC-SLP 03/12/2022,3:43 PM

## 2022-03-12 NOTE — Progress Notes (Signed)
Inpatient Rehab Admissions Coordinator:   Spoke with patient's son and wife on phone this morning. Patient may need ICD. Wife wants to wait until early next week to even discuss any of this. ( Potential CIR admission). Will continue to follow patient and touch base early next week with patient/family.   Rehab Admissons Coordinator [place], [place], [place] [phone]

## 2022-03-12 NOTE — Progress Notes (Signed)
Spoke to son, who requested to speak to someone in regards to rehab.  Son, [name], would like for his father to attend CIR instead of [facility].  After reading the note from [facility] Admission Coordinator, I called the number listed and spoke to her.  She will relay the message to her partner and partner will call son to speak to him re: rehab.

## 2022-03-12 NOTE — Progress Notes (Signed)
Heart Failure Navigator Progress Note  Assessed for Heart & Vascular TOC clinic readiness.  Patient does not meet criteria due to Advanced Heart Failure Team Dr. Haroldine Laws consulted.   Navigator will sign off at this time.   Earnestine Leys, BSN, Clinical cytogeneticist Only

## 2022-03-13 ENCOUNTER — Inpatient Hospital Stay (HOSPITAL_COMMUNITY): Payer: No Typology Code available for payment source

## 2022-03-13 DIAGNOSIS — I5022 Chronic systolic (congestive) heart failure: Secondary | ICD-10-CM | POA: Diagnosis not present

## 2022-03-13 DIAGNOSIS — I639 Cerebral infarction, unspecified: Secondary | ICD-10-CM | POA: Diagnosis not present

## 2022-03-13 LAB — GLUCOSE, CAPILLARY
Glucose-Capillary: 141 mg/dL — ABNORMAL HIGH (ref 70–99)
Glucose-Capillary: 158 mg/dL — ABNORMAL HIGH (ref 70–99)
Glucose-Capillary: 185 mg/dL — ABNORMAL HIGH (ref 70–99)
Glucose-Capillary: 193 mg/dL — ABNORMAL HIGH (ref 70–99)

## 2022-03-13 LAB — BASIC METABOLIC PANEL
Anion gap: 8 (ref 5–15)
BUN: 46 mg/dL — ABNORMAL HIGH (ref 8–23)
CO2: 22 mmol/L (ref 22–32)
Calcium: 7.9 mg/dL — ABNORMAL LOW (ref 8.9–10.3)
Chloride: 106 mmol/L (ref 98–111)
Creatinine, Ser: 1.94 mg/dL — ABNORMAL HIGH (ref 0.61–1.24)
GFR, Estimated: 35 mL/min — ABNORMAL LOW (ref >60)
Glucose, Bld: 158 mg/dL — ABNORMAL HIGH (ref 70–99)
Potassium: 3.6 mmol/L (ref 3.5–5.1)
Sodium: 136 mmol/L (ref 135–145)

## 2022-03-13 LAB — MRSA NEXT GEN BY PCR, NASAL: MRSA by PCR Next Gen: DETECTED — AB

## 2022-03-13 LAB — LIPOPROTEIN A (LPA): Lipoprotein (a): 8.5 nmol/L (ref <75.0)

## 2022-03-13 LAB — PROCALCITONIN: Procalcitonin: 0.36 ng/mL

## 2022-03-13 MED ORDER — ROSUVASTATIN CALCIUM 5 MG PO TABS
10.0000 mg | ORAL_TABLET | Freq: Every day | ORAL | Status: DC
  Administered 2022-03-13 – 2022-03-21 (×9): 10 mg via ORAL
  Filled 2022-03-13 (×10): qty 2

## 2022-03-13 MED ORDER — CHLORHEXIDINE GLUCONATE CLOTH 2 % EX PADS
6.0000 | MEDICATED_PAD | Freq: Every day | CUTANEOUS | Status: AC
  Administered 2022-03-14 – 2022-03-17 (×4): 6 via TOPICAL

## 2022-03-13 MED ORDER — AMOXICILLIN-POT CLAVULANATE 500-125 MG PO TABS
1.0000 | ORAL_TABLET | Freq: Two times a day (BID) | ORAL | Status: AC
  Administered 2022-03-13 – 2022-03-19 (×13): 1 via ORAL
  Filled 2022-03-13 (×14): qty 1

## 2022-03-13 MED ORDER — AMOXICILLIN-POT CLAVULANATE 875-125 MG PO TABS: 1.0000 | ORAL_TABLET | Freq: Two times a day (BID) | ORAL | Status: DC

## 2022-03-13 MED ORDER — MUPIROCIN 2 % EX OINT
1.0000 | TOPICAL_OINTMENT | Freq: Two times a day (BID) | CUTANEOUS | Status: DC
  Administered 2022-03-14 – 2022-03-18 (×9): 1 via NASAL
  Filled 2022-03-13: qty 22

## 2022-03-13 MED ORDER — LIDOCAINE 5 % EX PTCH
1.0000 | MEDICATED_PATCH | CUTANEOUS | Status: DC
  Administered 2022-03-13 – 2022-03-20 (×3): 1 via TRANSDERMAL
  Filled 2022-03-13 (×5): qty 1

## 2022-03-13 MED ORDER — ENSURE ENLIVE PO LIQD
237.0000 mL | Freq: Two times a day (BID) | ORAL | Status: DC
  Administered 2022-03-13: 237 mL via ORAL

## 2022-03-13 MED ORDER — MEGESTROL ACETATE 400 MG/10ML PO SUSP
400.0000 mg | Freq: Every day | ORAL | Status: DC
  Administered 2022-03-13 – 2022-03-20 (×8): 400 mg via ORAL
  Filled 2022-03-13 (×11): qty 10

## 2022-03-13 NOTE — Progress Notes (Signed)
NAME:  William Barron, MRN:  [id number], DOB:  12/29/1945, LOS: 7 ADMISSION DATE:  03/05/2022, CONSULTATION DATE:  03/13/2022 REFERRING MD:  Dr. Aundra Dubin, CHIEF COMPLAINT:  hypoxemia    History of Present Illness:   76 yo M, PMH pancreatic CA s/p whipple, prostate cancer, amyloidosis and MM, HLD, DMII, Admitted for increased SOB. Has been manageed by [employer]. Was influenza positive and just completed 5 day course of tamiflu. cMRI not consistent with cardiac amyloid. Currently on HR meds.   HF feels like he is deconditioned. Failure to thrive. cMRI with infiltrate in RLL.   Pertinent  Medical History   Past Medical History:  Diagnosis Date   Amyloidosis ([place])    Diabetes mellitus without complication ([place])    Feeding tube dysfunction, initial encounter 08/12/2021   GERD (gastroesophageal reflux disease)    Hearing loss    Has hearing aids   History of kidney stones    Hyperlipidemia    Jejunostomy tube present ([facility])    Multiple myeloma ([facility])    and amylodosis    Nephrolithiasis    Occasional tremors    Pancreatic insufficiency    Pneumonia    Prostate cancer ([place])    Prostatitis    acute and chronic   Skin cancer      Significant Hospital Events: Including procedures, antibiotic start and stop dates in addition to other pertinent events     Interim History / Subjective:   Per HPI  Objective   Blood pressure (!) 108/58, pulse 69, temperature 98.5 F (36.9 C), temperature source Oral, resp. rate 20, height _0  (1.651 m), weight 57.2 kg, SpO2 92 %.        Intake/Output Summary (Last 24 hours) at 03/13/2022 1242 Last data filed at 03/13/2022 0411 Gross per 24 hour  Intake 120 ml  Output 300 ml  Net -180 ml   Filed Weights   03/08/22 0500 03/11/22 0906 03/13/22 0410  Weight: 64.7 kg 58.5 kg 57.2 kg    Examination: General: elderly male, chronically ill appearing  HENT: NCAT  Lungs: BL rhonchi, diminshed in the bases  Cardiovascular: RRR, s1 s2   Abdomen: soft nt nd  Extremities: no edema  Neuro: alert, follows commands  GU: deferred   Resolved Hospital Problem list    Assessment & Plan:   76 yo M, multiple medical problems.  Acute hypoxemic respiratory failure  Influenza A s/p 5 days tamiflu  New onset CHF, EF 35-40%  Acute Right PCA Stroke,  Chronic calcified mediastinal adenopathy  H/o pancreatic cancer s/p whipple H/o AL Amyloidosis and MM H/o Prostate Cancer  Prior VT arrest Severely deconditioned and weak   P: Recent cMRI with redemonstration of calcified adenopathy  Right LL compression and infiltrate related to large adenopathy and basilar atelectasis + having influenza are plenty of reason to have hypoxemia  No need for bronch  Adenopathy is chronically calcified  Treat with augmentin X 7 days empirically, to avoid the sodium content of unasyn  Will check MRSA swab, prior mrsa swabs have been negative on previous admissions  Agree with ct imaging of the chest But the RLL infiltrate explains his hypoxemia  I suspect its going to take a while for him to recover from a hypoxemia standpoint  I agree with trying to see if he can bounce back from this. I agree that a palliative approach makes the most sense PT OT and mobility  Would put him up in chair as much as tolerated  Labs   CBC: Recent Labs  Lab 03/07/22 0723 03/11/22 0215 03/11/22 1205 03/11/22 1214 03/12/22 0250  WBC 7.0 3.7*  --   --  4.7  NEUTROABS  --   --   --   --  3.9  HGB 9.3* 11.1* 11.2* 11.2*  11.2* 10.9*  HCT 29.7* 35.2* 33.0* 33.0*  33.0* 34.4*  MCV 76.5* 76.2*  --   --  75.6*  PLT 334 316  --   --  916    Basic Metabolic Panel: Recent Labs  Lab 03/07/22 0723 03/08/22 0303 03/09/22 0435 03/11/22 0215 03/11/22 0539 03/11/22 1205 03/11/22 1214 03/12/22 0250 03/13/22 0103  NA 137   < > 135 137 137 136 138  137 137 136  K 3.4*   < > 4.0 4.0 3.9 3.9 3.8  3.9 3.8 3.6  CL 107   < > 99 103 104  --   --  102 106  CO2  22   < > 23 24 21*  --   --  22 22  GLUCOSE 184*   < > 156* 130* 175*  --   --  191* 158*  BUN 10   < > 24* 31* 33*  --   --  36* 46*  CREATININE 0.98   < > 0.99 1.32* 1.37*  --   --  1.44* 1.94*  CALCIUM 8.5*   < > 8.6* 8.3* 8.2*  --   --  8.2* 7.9*  MG 1.7  --   --   --  2.3  --   --  2.3  --   PHOS  --   --   --   --   --   --   --  4.4  --    < > = values in this interval not displayed.   GFR: Estimated Creatinine Clearance: 25.4 mL/min (A) (by C-G formula based on SCr of 1.94 mg/dL (H)). Recent Labs  Lab 03/07/22 0723 03/07/22 1243 03/11/22 0215 03/11/22 0539 03/11/22 0721 03/12/22 0250  PROCALCITON  --  <0.10  --   --   --   --   WBC 7.0  --  3.7*  --   --  4.7  LATICACIDVEN  --   --   --  1.9 1.8  --     Liver Function Tests: Recent Labs  Lab 03/12/22 0250  ALBUMIN 2.6*   No results for input(s): "LIPASE", "AMYLASE" in the last 168 hours. No results for input(s): "AMMONIA" in the last 168 hours.  ABG    Component Value Date/Time   PHART 7.451 (H) 03/11/2022 1205   PCO2ART 30.8 (L) 03/11/2022 1205   PO2ART 64 (L) 03/11/2022 1205   HCO3 23.6 03/11/2022 1214   HCO3 23.6 03/11/2022 1214   TCO2 25 03/11/2022 1214   TCO2 25 03/11/2022 1214   ACIDBASEDEF 1.0 03/11/2022 1214   ACIDBASEDEF 1.0 03/11/2022 1214   O2SAT 46 03/11/2022 1214   O2SAT 45 03/11/2022 1214     Coagulation Profile: No results for input(s): "INR", "PROTIME" in the last 168 hours.  Cardiac Enzymes: No results for input(s): "CKTOTAL", "CKMB", "CKMBINDEX", "TROPONINI" in the last 168 hours.  HbA1C: Hgb A1c MFr Bld  Date/Time Value Ref Range Status  03/08/2022 03:03 AM 6.5 (H) 4.8 - 5.6 % Final    Comment:    (NOTE)         Prediabetes: 5.7 - 6.4         Diabetes: >6.4  Glycemic control for adults with diabetes: <7.0   03/06/2022 06:15 AM 6.7 (H) 4.8 - 5.6 % Final    Comment:    (NOTE)         Prediabetes: 5.7 - 6.4         Diabetes: >6.4         Glycemic control for adults  with diabetes: <7.0     CBG: Recent Labs  Lab 03/12/22 1100 03/12/22 1654 03/12/22 2028 03/13/22 0616 03/13/22 1225  GLUCAP 157* 124* 100* 141* 158*    Review of Systems:   Review of Systems  Constitutional:  Positive for malaise/fatigue and weight loss. Negative for chills and fever.  HENT:  Negative for hearing loss, sore throat and tinnitus.   Eyes:  Negative for blurred vision and double vision.  Respiratory:  Positive for shortness of breath. Negative for cough, hemoptysis, sputum production, wheezing and stridor.   Cardiovascular:  Negative for chest pain, palpitations, orthopnea, leg swelling and PND.  Gastrointestinal:  Negative for abdominal pain, constipation, diarrhea, heartburn, nausea and vomiting.  Genitourinary:  Negative for dysuria, hematuria and urgency.  Musculoskeletal:  Positive for myalgias. Negative for joint pain.  Skin:  Negative for itching and rash.  Neurological:  Negative for dizziness, tingling, weakness and headaches.  Endo/Heme/Allergies:  Negative for environmental allergies. Does not bruise/bleed easily.  Psychiatric/Behavioral:  Negative for depression. The patient is not nervous/anxious and does not have insomnia.   All other systems reviewed and are negative.    Past Medical History:  He,  has a past medical history of Amyloidosis ([place]), Diabetes mellitus without complication ([place]), Feeding tube dysfunction, initial encounter (08/12/2021), GERD (gastroesophageal reflux disease), Hearing loss, History of kidney stones, Hyperlipidemia, Jejunostomy tube present ([place]), Multiple myeloma ([place]), Nephrolithiasis, Occasional tremors, Pancreatic insufficiency, Pneumonia, Prostate cancer ([place]), Prostatitis, and Skin cancer.   Surgical History:   Past Surgical History:  Procedure Laterality Date   APPENDECTOMY     CARDIAC CATHETERIZATION N/A 06/13/2015   Procedure: Left Heart Cath and Coronary Angiography;  Surgeon: Jettie Booze, MD;  Location:  [facility];  Service: Cardiovascular;  Laterality: N/A;   COLONOSCOPY  04/07/2000   ELBOW SURGERY  03/15/2001   right   ESOPHAGOGASTRODUODENOSCOPY (EGD) WITH PROPOFOL N/A 09/07/2021   Procedure: ESOPHAGOGASTRODUODENOSCOPY (EGD) WITH PROPOFOL;  Surgeon: Irene Shipper, MD;  Location: [facility];  Service: Gastroenterology;  Laterality: N/A;   EXTRACORPOREAL SHOCK WAVE LITHOTRIPSY Left 07/17/2018   Procedure: EXTRACORPOREAL SHOCK WAVE LITHOTRIPSY (ESWL);  Surgeon: [name], MD;  [facility];  Service: Urology;  Laterality: Left;   EXTRACORPOREAL SHOCK WAVE LITHOTRIPSY Right 12/08/2020   Procedure: RIGHT EXTRACORPOREAL SHOCK WAVE LITHOTRIPSY (ESWL);  Surgeon: Raynelle Bring, MD;  [facility];  Service: Urology;  Laterality: Right;   FOOT ARTHRODESIS Right 04/15/2021   Procedure: RIGHT SUBTALAR AND TALONAVICULAR FUSION;  Surgeon: Newt Minion, MD;  Location: [facility];  Service: Orthopedics;  Laterality: Right;   HERNIA REPAIR     IR MECH REMOV OBSTRUC MAT ANY COLON TUBE W/FLUORO  08/27/2021   IR REPLC GASTRO/COLONIC TUBE PERCUT W/FLUORO  09/08/2021   RIGHT/LEFT HEART CATH AND CORONARY ANGIOGRAPHY N/A 03/11/2022   Procedure: RIGHT/LEFT HEART CATH AND CORONARY ANGIOGRAPHY;  Surgeon: Jettie Booze, MD;  Location: [facility];  Service: Cardiovascular;  Laterality: N/A;   ROTATOR CUFF REPAIR     WHIPPLE PROCEDURE  07/13/2021     Social History:   reports that he quit smoking about 45  years ago. His smoking use included cigarettes. He has a 60.00 pack-year smoking history. He has never used smokeless tobacco. He reports current alcohol use of about 3.0 standard drinks of alcohol per week. He reports that he does not use drugs.   Family History:  His family history includes CAD (age of onset: 15) in his brother; Cancer in his paternal grandmother; Pancreatic cancer in his father; Parkinsonism in his mother; Prostate cancer in his father. There is no  history of Breast cancer or Colon cancer.   Allergies No Known Allergies   Home Medications  Prior to Admission medications   Medication Sig Start Date End Date Taking? Authorizing Provider  Cyanocobalamin (B-12 PO) Take 1 tablet by mouth in the morning.   Yes [provider]  insulin aspart (NOVOLOG FLEXPEN) 100 UNIT/ML FlexPen Inject 8 Units into the skin 3 (three) times daily before meals.   Yes [provider]  lipase/protease/amylase (CREON) 36000 UNITS CPEP capsule Take 3 capsules (108,000 Units total) by mouth 3 (three) times daily with meals. May also take 1 capsule (36,000 Units total) as needed (with snacks). Patient taking differently: Take 2-3 capsules (72000-108000 units) by mouth 3 (three) times daily with meals. May also take 1 capsule (36,000 units) as needed (with snacks). 01/14/22  Yes Gatha Mayer, MD  pantoprazole sodium (PROTONIX) 40 mg Take 40 mg by mouth daily. Patient taking differently: Take 40 mg by mouth in the morning. 01/14/22  Yes Gatha Mayer, MD  Testosterone 20.25 MG/ACT (1.62%) GEL Apply 4 Pump topically at bedtime.   Yes [provider]  Vibegron (GEMTESA) 75 MG TABS Take 75 mg by mouth in the morning.   Yes [provider]  BD PEN NEEDLE NANO U/F 32G X 4 MM MISC USE AS DIRECTED WITH INSULIN PEN THREE TIMES DAILY 01/27/22   Elayne Snare, MD  Continuous Blood Gluc Receiver (FREESTYLE LIBRE 2 READER) DEVI Use as instructed to check blood sugars 11/20/21   Elayne Snare, MD  Continuous Blood Gluc Sensor (FREESTYLE LIBRE 3 SENSOR) MISC Place 1 sensor on the skin every 14 days. Use to check glucose continuously 03/05/22   Elayne Snare, MD  insulin degludec (TRESIBA FLEXTOUCH) 100 UNIT/ML FlexTouch Pen Inject 15 Units into the skin daily. Patient taking differently: Inject 6 Units into the skin in the morning. 11/17/21   Elayne Snare, MD     Garner Nash, DO [facility] 03/13/2022 12:42 PM

## 2022-03-13 NOTE — Progress Notes (Signed)
Mobility Specialist Progress Note:   03/13/22 1244  Mobility  Activity Transferred from bed to chair  Level of Assistance Moderate assist, patient does 50-74%  Assistive Device Front wheel walker  Distance Ambulated (ft) 4 ft  Activity Response Tolerated well  $Mobility charge 1 Mobility   Pt in bed asking to get to wheelchair to get pushed around a little. No complaints of pain. ModA to stand. Left in wheelchair with son.   Gareth Eagle Ahkeem Goede Mobility Specialist Please contact via [facility] [employer] or  Rehab Office at [phone]

## 2022-03-13 NOTE — Plan of Care (Signed)
Pending cardiac MRI read to determine consideration of ICD.

## 2022-03-13 NOTE — Progress Notes (Addendum)
Patient ID: William Barron, male   DOB: Feb 21, 1946, 76 y.o.   MRN: [id number]     Advanced Heart Failure Rounding Note  PCP-Cardiologist: William Breeding, MD   Subjective:    No dyspnea at rest.  Remains on 5L HFNC.  Has productive cough.  Afebrile.   Creatinine higher at 1.94 today. BP stable.   Weak, has eaten very little.  Required a lot of help getting out of bed yesterday.    He has completed oseltamivir for influenza A.   Objective:   Weight Range: 57.2 kg Body mass index is 20.98 kg/m.   Vital Signs:   Temp:  [96.7 F (35.9 C)-98.5 F (36.9 C)] 98.5 F (36.9 C) (12/30 0904) Pulse Rate:  [65-69] 69 (12/30 0904) Resp:  [19-20] 20 (12/30 0904) BP: (103-133)/(51-59) 108/58 (12/30 0904) SpO2:  [92 %-97 %] 92 % (12/30 0904) Weight:  [57.2 kg] 57.2 kg (12/30 0410) Last BM Date : 03/10/22  Weight change: Filed Weights   03/08/22 0500 03/11/22 0906 03/13/22 0410  Weight: 64.7 kg 58.5 kg 57.2 kg    Intake/Output:   Intake/Output Summary (Last 24 hours) at 03/13/2022 1228 Last data filed at 03/13/2022 0411 Gross per 24 hour  Intake 120 ml  Output 300 ml  Net -180 ml      Physical Exam    General:  Well appearing. No resp difficulty HEENT: Normal Neck: Supple. JVP not elevated. Carotids 2+ bilat; no bruits. No lymphadenopathy or thyromegaly appreciated. Cor: PMI nondisplaced. Regular rate & rhythm. No rubs, gallops or murmurs. Lungs: Mild rhonchi bilaterally.  Abdomen: Soft, nontender, nondistended. No hepatosplenomegaly. No bruits or masses. Good bowel sounds. Extremities: No cyanosis, clubbing, rash, edema Neuro: Alert & orientedx3, cranial nerves grossly intact. moves all 4 extremities w/o difficulty. Affect pleasant   Telemetry   NSR 70s (personally reviewed)  Labs    CBC Recent Labs    03/11/22 0215 03/11/22 1205 03/11/22 1214 03/12/22 0250  WBC 3.7*  --   --  4.7  NEUTROABS  --   --   --  3.9  HGB 11.1*   < > 11.2*  11.2* 10.9*  HCT  35.2*   < > 33.0*  33.0* 34.4*  MCV 76.2*  --   --  75.6*  PLT 316  --   --  310   < > = values in this interval not displayed.   Basic Metabolic Panel Recent Labs    03/11/22 0539 03/11/22 1205 03/12/22 0250 03/13/22 0103  NA 137   < > 137 136  K 3.9   < > 3.8 3.6  CL 104  --  102 106  CO2 21*  --  22 22  GLUCOSE 175*  --  191* 158*  BUN 33*  --  36* 46*  CREATININE 1.37*  --  1.44* 1.94*  CALCIUM 8.2*  --  8.2* 7.9*  MG 2.3  --  2.3  --   PHOS  --   --  4.4  --    < > = values in this interval not displayed.   Liver Function Tests Recent Labs    03/12/22 0250  ALBUMIN 2.6*   No results for input(s): "LIPASE", "AMYLASE" in the last 72 hours. Cardiac Enzymes No results for input(s): "CKTOTAL", "CKMB", "CKMBINDEX", "TROPONINI" in the last 72 hours.  BNP: BNP (last 3 results) Recent Labs    03/07/22 1237  BNP 1,326.2*    ProBNP (last 3 results) No results for input(s): "PROBNP" in  the last 8760 hours.   D-Dimer No results for input(s): "DDIMER" in the last 72 hours. Hemoglobin A1C No results for input(s): "HGBA1C" in the last 72 hours. Fasting Lipid Panel No results for input(s): "CHOL", "HDL", "LDLCALC", "TRIG", "CHOLHDL", "LDLDIRECT" in the last 72 hours. Thyroid Function Tests No results for input(s): "TSH", "T4TOTAL", "T3FREE", "THYROIDAB" in the last 72 hours.  Invalid input(s): "FREET3"  Other results:   Imaging    No results found.   Medications:     Scheduled Medications:  amiodarone  400 mg Oral BID   Followed by   Derrill Memo ON 03/26/2022] amiodarone  400 mg Oral Daily   aspirin EC  325 mg Oral Daily   clopidogrel  75 mg Oral Daily   dapagliflozin propanediol  10 mg Oral Daily   feeding supplement  237 mL Oral BID BM   heparin  5,000 Units Subcutaneous Q8H   insulin aspart  0-9 Units Subcutaneous TID WC   insulin aspart  3 Units Subcutaneous TID WC   insulin glargine-yfgn  10 Units Subcutaneous Daily   lipase/protease/amylase   72,000 Units Oral TID WC   mirabegron ER  25 mg Oral Daily   nystatin  5 mL Oral QID   pantoprazole  40 mg Oral Daily   sodium chloride flush  3 mL Intravenous Q12H   spironolactone  12.5 mg Oral Daily    Infusions:  sodium chloride      PRN Medications: sodium chloride, acetaminophen **OR** acetaminophen (TYLENOL) oral liquid 160 mg/5 mL **OR** acetaminophen, ipratropium-albuterol, lidocaine, sodium chloride flush    Assessment/Plan   1. Acute on chronic systolic CHF: Echo (8/41) with EF 55-60%.  Echo 12/23 with EF 30-35%, normal RV, normal IVC.  LHC/RHC 12/23 with moderate CAD (60% mLAD, 70% dLAD, 70% D2, 60% dRCA) and low CI 1.87 with normal right and left heart filling pressures.  CAD does not explain fall in EF.  Cardiac MRI showed LV EF 33% with diffuse hypokinesis, RV EF 46% predominantly mid-wall LGE with some subendocardial component in the basal inferolateral wall and focally in the mid lateral wall.  Pattern is not clearly due to prior MI but cannot rule this out given moderate CAD on cath. Alternatively could be due to prior myocarditis. T2 not elevated in the basal inferolateral wall, so do not suspect active inflammation. Findings NOT consistent with cardiac AL amyloidosis. On exam and by recent [place], he is not volume overloaded.  Creatinine up to 1.94 today.  - With rising creatinine, will hold Entresto for now.  - Does not need a diuretic.  - Continue dapagliflozin and spironolactone unless creatinine continues to rise.  - Encourage po hydration.   - Would hold off on inotropic support for the time being, think main problem at this time is PNA/lung infiltrates and failure to thrive/malnutrition.  2. CVA: Right occipital CVA with distal right PCA severe stenosis vs occlusion. Presented with visual loss, now improved.  Neurology has signed off.  - ASA 325 + Plavix 75 x 3 months then ASA 81 daily.  - Crestor 10 daily 3. VT arrest: 1 minute CPR with ROSC.  He is on amiodarone.   Suspect triggered by lateral wall scar (prior MI versus myocarditis).  EP has seen, recommend amiodarone and Lifevest at discharge to follow post-discharge course and decide on ICD down the road depending on his trajectory.  - Continue amiodarone po.  4. CAD: LHC this admission with moderate CAD (60% mLAD, 70% dLAD, 70% D2, 60% dRCA).  Out of proportion to cardiomyopathy.  As noted above, cMRI suggested most likely prior myocarditis but cannot fully rule out prior MI involving basal lateral wall. No chest pain.  - Continue ASA as above.  - Start Crestor 10 mg daily.  5. Multiple myeloma with AL amyloidosis isolated to lymph nodes: Follow[facility] since 2014. Treated with chemo in past. Has known bulky mediastinal/hilar lymphadenopathy.  6. Prostate cancer: Treated with radiation in 2022.  7. Right eye diplopia: Chronic, 6th nerve palsy.  8. H/o Whipple procedure for IPMN in 5/23.  9. Influenza A: Has been treated with 5 days oseltamivir this admission.  9. Pulmonary: He is still on 5L HFNC.  Was not on home oxygen.  cMRI yesterday showed bilateral R>L extensive basilar airspace disease as well as extensive/bulky hilar and mediastinal lymphadenopathy.  He has a cough, no fever or leukocytosis.  He had a VT arrest with 1 minute CPR, possibly has aspiration pneumonitis.  However, with extensive lymphadenopathy, concern for possible post-obstructive PNA.  Also consider secondary bacterial PNA post-influenza.  - I will ask pulmonary to see, ?any utility to bronchoscopy here though with patient's frailty would have high threshold.   - Will order CT chest, pulmonary can cancel if not thought to be needed.  - Send procalcitonin and sputum/blood culture.  - Would favor antibiotic coverage, will await pulmonary opinion however.  10. Frailty/failure to thrive: Per some, has not recovered well at all post-Whipple in 5/23. He was very weak with standing yesterday and has not been eating much.  Albumin low,  malnourished.  - Nutrition consult.  - Mobilize, PT.  - Add Ensure.  - Liberalize diet.  - ?CIR but too weak currently.  [place]: With odynophagia.  - Continue Nystatin.   Concern for poor long-term prognosis with failure to thrive, VT arrest, PNA.  Will need ongoing code discussions.  Son has just arrived from [place] and wife is at home with flu so will need some time to process.    Length of Stay: 7  Loralie Champagne, MD  03/13/2022, 12:28 PM  Advanced Heart Failure Team Pager [phone] (M-F; 7a - 5p)  Please contact [facility] for night-coverage after hours (5p -7a ) and weekends on [email or web address]

## 2022-03-13 NOTE — Progress Notes (Signed)
[facility]                                                                              William Barron, is a 76 y.o. male, DOB - 11/28/1945, AJO:[id number] Admit date - 03/05/2022    Outpatient Primary MD for the patient is William Hatchet, MD  LOS - 7  days  Chief Complaint  Patient presents with   Headache       Brief summary   Patient is a 76 year old male with history of pancreatic CA status post Whipple, prostate CA, multiple myeloma, amyloidosis, dyslipidemia, DM, presented to ED with right-sided headache and blurred vision.  Last known well was in the evening of 03/04/2022.  He woke up on the morning of admission on 12/22 with difficulty seeing out of his right eye.  He went to see an ophthalmologist and was referred to ER CT head showed acute/subacute right occipital CVA Failed swallow evaluation Neurology was consulted, admitted for further workup..  03/11/2022: Events of earlier this morning is noted.  Patient was found unresponsive and pulseless around 4:45 AM.  Patient was found to have gone into VT/VF cardiac arrest in setting of low EF 30 to 35%.  Patient has been successfully resuscitated.  Patient underwent left and right heart cardiac catheterization afterwards.  Electrophysiology team has been consulted and ICD has been discussed with patient.  Patient is now on amiodarone drip.  03/12/2022: Patient seen.  No new complaints.  Patient has not a particularly good historian.  Patient underwent cardiac MRI today.  Cardiology input is highly appreciated.  03/13/2022: Patient seen alongside patient's son.  Throat pain has improved.  Patient continues to report right-sided chest pain (likely from CPR).  Will apply lidocaine to the musculoskeletal pain.  Megace is prescribed for poor p.o. intake.  Bibasal infiltrate noted, query secondary to aspiration.  Repeat speech evaluation.  Caloric count.  Guarded prognosis.  Patient's son was updated.  All questions  answered.   Assessment & Plan    Principal Problem: Acute right PCA stroke (HCC) right CN VI palsy, chronic -Presented with visual issues, having difficulty seeing half of the clock.  CT head showed right PCA territory stroke. -MRI brain showed acute to early subacute right PCA territory infarct.  Associated mild petechial blood products without frank hemorrhagic transformation or significant residual mass effect.  Underlying moderate chronic microvascular ischemic disease -CTA head and neck showed acute or early subacute infarct in the right occipital lobe, suspected high-grade stenosis versus occlusion of the distal right PCA. -2D echo: EF of 35 to 40% with global hypokinesis, mild AS, mild pulmonary regurg, mild MR, mild AR (EF 50 to 55% on echo in 11/2021) -Per neuro, continue aspirin 325 mg and Plavix 75 mg DAPT for 3 months given intracranial stenosis/occlusion then aspirin 81 mg alone daily indefinitely  -Lipid panel showed LDL 66, at goal, no need of statin per neurology -Hemoglobin A1c 6.5 -Loop recorder per neuro, timing per cardiology  Active Problems: New onset systolic CHF, EF 35 to 94% -2D echo showed EF of 35 to 40% with global hypokinesis (was 50 to 55% on echo in 11/2021) -  Received Lasix 20 mg IV x 1 on 12/24 -BNP 1326.2, received Lasix 20 mg IV x 1 on 12/24, negative balance of 2.4 L -Continue Coreg, losartan 12.5 mg daily, Farxiga 10 mg daily  -Lasix as needed 03/12/2022: Cardiology is managing.  Patient underwent cardiac MRI today.  Input from cardiology and EP teams SLF pressure today. 03/13/2022: Seems compensated.  Have a low threshold to hold digoxin due to worsening renal function.  Influenza A positive -COVID-19 negative, positive for influenza A - started on Tamiflu 30 mg twice daily, started on 03/07/2022.  Complete 5-day course.   Bibasilar infiltrates: -Procalcitonin came back normal. -Possibly secondary to aspiration. -Speech evaluation. -Patient is on  Augmentin.  Oral thrush/throat pain: -Throat pain has improved. -Complete course of oral nystatin.  Hypokalemia -Resolved Potassium today is 3.6.  GERD -Continue PPI  Diabetes mellitus type 2, IDDM HbA1c 6.5 Recent Labs    03/12/22 0631 03/12/22 1100 03/12/22 1654 03/12/22 2028 03/13/22 0616 03/13/22 1225  GLUCAP 177* 157* 124* 100* 141* 158*    -CBGs better, continue Semglee 10 units daily, NovoLog 3 units 3 times daily AC, SSI   History of pancreatic CA status post Whipple procedure -Continue Creon 72,000 units 3 times daily with meals -Follows with [name]  History of AL amyloidosis ([place]),  Multiple myeloma ([place]) -Outpatient follow-up with his oncologist, [name] at [facility].  History of prostate CA -Continue outpatient follow-up with urology, Dr. Jeffie Pollock, completed XRT on 06/02/2020    Lesion of parotid gland -MRI brain incidentally also showed 1.8 cm T2 hypointense lesion within the posterior aspect of the right parotid gland, indeterminate.  Recommended nonemergent outpatient ENT evaluation -CTA showed 1.6 cm partially calcified lesion in the posterior aspect of right parotid gland with multiple surrounding smaller lesion, could potentially represent primary parotid neoplasm benign or malignant or enlarged lymph nodes, recommended outpatient ENT evaluation  Code Status: Full code DVT Prophylaxis:  heparin injection 5,000 Units Start: 03/11/22 2200 SCD's Start: 03/05/22 2235  Level of Care: Level of care: Progressive Family Communication: Discussed with patient Disposition Plan:      Remains inpatient appropriate: PT recommended inpatient rehab.  TOC, CIR assisting and working with wife.  Procedures:  MRI brain, CTA head and neck 2D echo  Consultants:   Neurology Cardiology Electrophysiology. Pulmonary  Antimicrobials: Tamiflu started on 12/24 Medications  amiodarone  400 mg Oral BID   Followed by   Derrill Memo ON 03/26/2022] amiodarone   400 mg Oral Daily   amoxicillin-clavulanate  1 tablet Oral BID   aspirin EC  325 mg Oral Daily   clopidogrel  75 mg Oral Daily   dapagliflozin propanediol  10 mg Oral Daily   feeding supplement  237 mL Oral BID BM   heparin  5,000 Units Subcutaneous Q8H   insulin aspart  0-9 Units Subcutaneous TID WC   insulin aspart  3 Units Subcutaneous TID WC   insulin glargine-yfgn  10 Units Subcutaneous Daily   lipase/protease/amylase  72,000 Units Oral TID WC   megestrol  400 mg Oral Daily   mirabegron ER  25 mg Oral Daily   nystatin  5 mL Oral QID   pantoprazole  40 mg Oral Daily   rosuvastatin  10 mg Oral Daily   sodium chloride flush  3 mL Intravenous Q12H   spironolactone  12.5 mg Oral Daily      Subjective:   -No new complaints today.   Objective:   Vitals:   03/12/22 2026 03/12/22 2318  03/13/22 0410 03/13/22 0904  BP: (!) 103/57 (!) 106/51 (!) 104/59 (!) 108/58  Pulse: 69 66 65 69  Resp: _0 Temp: (!) 97.2 F (36.2 C) 97.6 F (36.4 C) (!) 96.7 F (35.9 C) 98.5 F (36.9 C)  TempSrc: Axillary Oral Axillary Oral  SpO2: 95% 94% 94% 92%  Weight:   57.2 kg   Height:        Intake/Output Summary (Last 24 hours) at 03/13/2022 1722 Last data filed at 03/13/2022 1414 Gross per 24 hour  Intake 120 ml  Output 925 ml  Net -805 ml      Wt Readings from Last 3 Encounters:  03/13/22 57.2 kg  01/28/22 66.3 kg  01/14/22 67.2 kg   MPhysical Exam General: Alert and oriented x 3, NAD Cardiovascular: S1 S2 clear, RRR.  Respiratory: Fairly CTA B Gastrointestinal: Soft, nontender, nondistended, NBS Neuro: no new deficits    Data Reviewed:  I have personally reviewed following labs    CBC Lab Results  Component Value Date   WBC 4.7 03/12/2022   RBC 4.55 03/12/2022   HGB 10.9 (L) 03/12/2022   HCT 34.4 (L) 03/12/2022   MCV 75.6 (L) 03/12/2022   MCH 24.0 (L) 03/12/2022   PLT 310 03/12/2022   MCHC 31.7 03/12/2022   RDW 18.6 (H) 03/12/2022   LYMPHSABS 0.6 (L)  03/12/2022   MONOABS 0.2 03/12/2022   EOSABS 0.0 03/12/2022   BASOSABS 0.0 82/99/3716     Last metabolic panel Lab Results  Component Value Date   NA 136 03/13/2022   K 3.6 03/13/2022   CL 106 03/13/2022   CO2 22 03/13/2022   BUN 46 (H) 03/13/2022   CREATININE 1.94 (H) 03/13/2022   GLUCOSE 158 (H) 03/13/2022   GFRNONAA 35 (L) 03/13/2022   GFRAA >60 07/13/2018   CALCIUM 7.9 (L) 03/13/2022   PHOS 4.4 03/12/2022   PROT 5.6 (L) 03/06/2022   ALBUMIN 2.6 (L) 03/12/2022   LABGLOB 2.7 06/21/2018   AGRATIO 1.3 06/21/2018   BILITOT 0.5 03/06/2022   ALKPHOS 564 (H) 03/06/2022   AST 28 03/06/2022   ALT 26 03/06/2022   ANIONGAP 8 03/13/2022    CBG (last 3)  Recent Labs    03/12/22 2028 03/13/22 0616 03/13/22 1225  GLUCAP 100* 141* 158*       Coagulation Profile: No results for input(s): "INR", "PROTIME" in the last 168 hours.    Radiology Studies: I have personally reviewed the imaging studies  MR CARDIAC VELOCITY FLOW MAP  Result Date: 03/13/2022 CLINICAL DATA:  Assess for cardiac amyloidosis. EXAM: CARDIAC MRI TECHNIQUE: The patient was scanned on a 1.5 Tesla GE magnet. A dedicated cardiac coil was used. Functional imaging was done using Fiesta sequences. 2,3, and 4 chamber views were done to assess for RWMA's. Modified Simpson's rule using a short axis stack was used to calculate an ejection fraction on a dedicated work Conservation officer, nature. The patient received 10 cc of Gadavist. After 10 minutes inversion recovery sequences were used to assess for infiltration and scar tissue. FINDINGS: Limited images of the lung fields showed extensive airspace disease right > left lung bases. Hilar lymphadenopathy. Large 9 x 2.6 cm soft tissue mass posterior to left atrium and compressing the atrium,?bulky mediastinal adenopathy. Trivial pericardial effusion. Mildly dilated left ventricle with normal wall thickness. Moderate global hypokinesis, EF 33%. Normal right ventricular  size with mild hypokinesis, EF 46%. Normal right atrial size. The left atrium is compressed by posterior mass,  possible mediastinal adenopathy. Mild mitral regurgitation visually though regurgitant fraction calculated at 27% suggests moderate MR. Mild aortic insufficiency (regurgitant fraction 15%) with trileaflet aortic valve. No significant stenosis. On delayed enhancement imaging, there was predominantly mid-wall late gadolinium enhancement (LGE) in the basal inferolateral wall and focally in the mid lateral wall there appeared to be a subendocardial component as well. Measurements: LVEDV 198 mL LVEDVi 121 mL/m2 LVSV 66 mL LVEF 33% RVEDV 150 mL RVEVDi 92 mL/m2 RVSV 69 mL RVEF 46% Aortic forward volume 48 mL Aortic regurgitant fraction 15% T1 1149, ECV 35% T2 49 basal inferolateral wall (normal). IMPRESSION: 1.  R>L base airspace disease as above. 2. Extensive hilar and mediastinal lymphadenopathy with compression of left atrium as noted above. 3. Mildly dilated LV with normal wall thickness. EF 33%, diffuse hypokinesis. 4.  Normal RV size with mild hypokinesis, EF 46%. 5.  Suspect mild aortic insuffiiciency and mitral regurgitation. 6. Predominantly mid-wall LGE with some subendocardial component in the basal inferolateral wall and focally in the mid lateral wall. Pattern is not clearly due to prior MI but cannot rule this out given moderate CAD on cath. Alternatively could be due to prior myocarditis. T2 not elevated in the basal inferolateral wall, so do not suspect active inflammation. 7. Findings not consistent with cardiac AL amyloidosis. No significant LVH, extracellular volume percentage only mildly elevated, and LGE is not characteristic. Dalton Mclean Electronically Signed   By: Loralie Champagne M.D.   On: 03/13/2022 11:57   MR CARDIAC VELOCITY FLOW MAP  Result Date: 03/13/2022 CLINICAL DATA:  Assess for cardiac amyloidosis. EXAM: CARDIAC MRI TECHNIQUE: The patient was scanned on a 1.5 Tesla GE  magnet. A dedicated cardiac coil was used. Functional imaging was done using Fiesta sequences. 2,3, and 4 chamber views were done to assess for RWMA's. Modified Simpson's rule using a short axis stack was used to calculate an ejection fraction on a dedicated work Conservation officer, nature. The patient received 10 cc of Gadavist. After 10 minutes inversion recovery sequences were used to assess for infiltration and scar tissue. FINDINGS: Limited images of the lung fields showed extensive airspace disease right > left lung bases. Hilar lymphadenopathy. Large 9 x 2.6 cm soft tissue mass posterior to left atrium and compressing the atrium,?bulky mediastinal adenopathy. Trivial pericardial effusion. Mildly dilated left ventricle with normal wall thickness. Moderate global hypokinesis, EF 33%. Normal right ventricular size with mild hypokinesis, EF 46%. Normal right atrial size. The left atrium is compressed by posterior mass, possible mediastinal adenopathy. Mild mitral regurgitation visually though regurgitant fraction calculated at 27% suggests moderate MR. Mild aortic insufficiency (regurgitant fraction 15%) with trileaflet aortic valve. No significant stenosis. On delayed enhancement imaging, there was predominantly mid-wall late gadolinium enhancement (LGE) in the basal inferolateral wall and focally in the mid lateral wall there appeared to be a subendocardial component as well. Measurements: LVEDV 198 mL LVEDVi 121 mL/m2 LVSV 66 mL LVEF 33% RVEDV 150 mL RVEVDi 92 mL/m2 RVSV 69 mL RVEF 46% Aortic forward volume 48 mL Aortic regurgitant fraction 15% T1 1149, ECV 35% T2 49 basal inferolateral wall (normal). IMPRESSION: 1.  R>L base airspace disease as above. 2. Extensive hilar and mediastinal lymphadenopathy with compression of left atrium as noted above. 3. Mildly dilated LV with normal wall thickness. EF 33%, diffuse hypokinesis. 4.  Normal RV size with mild hypokinesis, EF 46%. 5.  Suspect mild aortic  insuffiiciency and mitral regurgitation. 6. Predominantly mid-wall LGE with some subendocardial component in the basal  inferolateral wall and focally in the mid lateral wall. Pattern is not clearly due to prior MI but cannot rule this out given moderate CAD on cath. Alternatively could be due to prior myocarditis. T2 not elevated in the basal inferolateral wall, so do not suspect active inflammation. 7. Findings not consistent with cardiac AL amyloidosis. No significant LVH, extracellular volume percentage only mildly elevated, and LGE is not characteristic. Dalton Mclean Electronically Signed   By: Loralie Champagne M.D.   On: 03/13/2022 11:57   MR CARDIAC MORPHOLOGY W WO CONTRAST  Result Date: 03/13/2022 CLINICAL DATA:  Assess for cardiac amyloidosis. EXAM: CARDIAC MRI TECHNIQUE: The patient was scanned on a 1.5 Tesla GE magnet. A dedicated cardiac coil was used. Functional imaging was done using Fiesta sequences. 2,3, and 4 chamber views were done to assess for RWMA's. Modified Simpson's rule using a short axis stack was used to calculate an ejection fraction on a dedicated work Conservation officer, nature. The patient received 10 cc of Gadavist. After 10 minutes inversion recovery sequences were used to assess for infiltration and scar tissue. FINDINGS: Limited images of the lung fields showed extensive airspace disease right > left lung bases. Hilar lymphadenopathy. Large 9 x 2.6 cm soft tissue mass posterior to left atrium and compressing the atrium,?bulky mediastinal adenopathy. Trivial pericardial effusion. Mildly dilated left ventricle with normal wall thickness. Moderate global hypokinesis, EF 33%. Normal right ventricular size with mild hypokinesis, EF 46%. Normal right atrial size. The left atrium is compressed by posterior mass, possible mediastinal adenopathy. Mild mitral regurgitation visually though regurgitant fraction calculated at 27% suggests moderate MR. Mild aortic insufficiency  (regurgitant fraction 15%) with trileaflet aortic valve. No significant stenosis. On delayed enhancement imaging, there was predominantly mid-wall late gadolinium enhancement (LGE) in the basal inferolateral wall and focally in the mid lateral wall there appeared to be a subendocardial component as well. Measurements: LVEDV 198 mL LVEDVi 121 mL/m2 LVSV 66 mL LVEF 33% RVEDV 150 mL RVEVDi 92 mL/m2 RVSV 69 mL RVEF 46% Aortic forward volume 48 mL Aortic regurgitant fraction 15% T1 1149, ECV 35% T2 49 basal inferolateral wall (normal). IMPRESSION: 1.  R>L base airspace disease as above. 2. Extensive hilar and mediastinal lymphadenopathy with compression of left atrium as noted above. 3. Mildly dilated LV with normal wall thickness. EF 33%, diffuse hypokinesis. 4.  Normal RV size with mild hypokinesis, EF 46%. 5.  Suspect mild aortic insuffiiciency and mitral regurgitation. 6. Predominantly mid-wall LGE with some subendocardial component in the basal inferolateral wall and focally in the mid lateral wall. Pattern is not clearly due to prior MI but cannot rule this out given moderate CAD on cath. Alternatively could be due to prior myocarditis. T2 not elevated in the basal inferolateral wall, so do not suspect active inflammation. 7. Findings not consistent with cardiac AL amyloidosis. No significant LVH, extracellular volume percentage only mildly elevated, and LGE is not characteristic. Dalton Mclean Electronically Signed   By: Loralie Champagne M.D.   On: 03/13/2022 11:56       Bonnell Public M.D. [facility] 03/13/2022, 5:22 PM  Available via Epic secure chat 7am-7pm After 7 pm, please refer to night coverage provider listed on amion.

## 2022-03-14 DIAGNOSIS — I639 Cerebral infarction, unspecified: Secondary | ICD-10-CM | POA: Diagnosis not present

## 2022-03-14 DIAGNOSIS — I5022 Chronic systolic (congestive) heart failure: Secondary | ICD-10-CM | POA: Diagnosis not present

## 2022-03-14 LAB — BASIC METABOLIC PANEL
Anion gap: 16 — ABNORMAL HIGH (ref 5–15)
BUN: 43 mg/dL — ABNORMAL HIGH (ref 8–23)
CO2: 19 mmol/L — ABNORMAL LOW (ref 22–32)
Calcium: 8.5 mg/dL — ABNORMAL LOW (ref 8.9–10.3)
Chloride: 105 mmol/L (ref 98–111)
Creatinine, Ser: 1.94 mg/dL — ABNORMAL HIGH (ref 0.61–1.24)
GFR, Estimated: 35 mL/min — ABNORMAL LOW (ref >60)
Glucose, Bld: 147 mg/dL — ABNORMAL HIGH (ref 70–99)
Potassium: 3.4 mmol/L — ABNORMAL LOW (ref 3.5–5.1)
Sodium: 140 mmol/L (ref 135–145)

## 2022-03-14 LAB — GLUCOSE, CAPILLARY
Glucose-Capillary: 174 mg/dL — ABNORMAL HIGH (ref 70–99)
Glucose-Capillary: 186 mg/dL — ABNORMAL HIGH (ref 70–99)
Glucose-Capillary: 147 mg/dL — ABNORMAL HIGH (ref 70–99)
Glucose-Capillary: 134 mg/dL — ABNORMAL HIGH (ref 70–99)

## 2022-03-14 MED ORDER — POTASSIUM CHLORIDE 10 MEQ/100ML IV SOLN
10.0000 meq | INTRAVENOUS | Status: AC
  Administered 2022-03-14 (×4): 10 meq via INTRAVENOUS
  Filled 2022-03-14 (×4): qty 100

## 2022-03-14 MED ORDER — KCL IN DEXTROSE-NACL 20-5-0.9 MEQ/L-%-% IV SOLN
INTRAVENOUS | Status: AC
  Filled 2022-03-14 (×2): qty 1000

## 2022-03-14 NOTE — Progress Notes (Signed)
[facility]                                                                              William Barron, is a 76 y.o. male, DOB - Feb 10, 1946, EXB:[id number] Admit date - 03/05/2022    Outpatient Primary MD for the patient is [name], MD  LOS - 8  days  Chief Complaint  Patient presents with   Headache       Brief summary   Patient is a 76 year old male with history of pancreatic CA status post Whipple, prostate CA, multiple myeloma, amyloidosis, dyslipidemia, DM, presented to ED with right-sided headache and blurred vision.  Last known well was in the evening of 03/04/2022.  He woke up on the morning of admission on 12/22 with difficulty seeing out of his right eye.  He went to see an ophthalmologist and was referred to ER CT head showed acute/subacute right occipital CVA Failed swallow evaluation Neurology was consulted, admitted for further workup..  03/11/2022: Events of earlier this morning is noted.  Patient was found unresponsive and pulseless around 4:45 AM.  Patient was found to have gone into VT/VF cardiac arrest in setting of low EF 30 to 35%.  Patient has been successfully resuscitated.  Patient underwent left and right heart cardiac catheterization afterwards.  Electrophysiology team has been consulted and ICD has been discussed with patient.  Patient is now on amiodarone drip.  03/12/2022: Patient seen.  No new complaints.  Patient has not a particularly good historian.  Patient underwent cardiac MRI today.  Cardiology input is highly appreciated.  03/13/2022: Patient seen alongside patient's son.  Throat pain has improved.  Patient continues to report right-sided chest pain (likely from CPR).  Will apply lidocaine to the musculoskeletal pain.  Megace is prescribed for poor p.o. intake.  Bibasal infiltrate noted, query secondary to aspiration.  Repeat speech evaluation.  Caloric count.  Guarded prognosis.  Patient's son was updated.  All questions  answered.   Assessment & Plan    Principal Problem: Acute right PCA stroke (HCC) right CN VI palsy, chronic -Presented with visual issues, having difficulty seeing half of the clock.  CT head showed right PCA territory stroke. -MRI brain showed acute to early subacute right PCA territory infarct.  Associated mild petechial blood products without frank hemorrhagic transformation or significant residual mass effect.  Underlying moderate chronic microvascular ischemic disease -CTA head and neck showed acute or early subacute infarct in the right occipital lobe, suspected high-grade stenosis versus occlusion of the distal right PCA. -2D echo: EF of 35 to 40% with global hypokinesis, mild AS, mild pulmonary regurg, mild MR, mild AR (EF 50 to 55% on echo in 11/2021) -Per neuro, continue aspirin 325 mg and Plavix 75 mg DAPT for 3 months given intracranial stenosis/occlusion then aspirin 81 mg alone daily indefinitely  -Lipid panel showed LDL 66, at goal, no need of statin per neurology -Hemoglobin A1c 6.5 -Loop recorder per neuro, timing per cardiology  Active Problems: New onset systolic CHF, EF 35 to 10% -2D echo showed EF of 35 to 40% with global hypokinesis (was 50 to 55% on echo in 11/2021) -  Received Lasix 20 mg IV x 1 on 12/24 -BNP 1326.2, received Lasix 20 mg IV x 1 on 12/24, negative balance of 2.4 L -Continue Coreg, losartan 12.5 mg daily, Farxiga 10 mg daily  -Lasix as needed 03/12/2022: Cardiology is managing.  Patient underwent cardiac MRI today.  Input from cardiology and EP teams SLF pressure today. 03/13/2022: Seems compensated.  Have a low threshold to hold digoxin due to worsening renal function. 03/14/2022: Patient seen alongside patient's son and speech therapies.  Patient's son is worried about aspiration and poor p.o. intake.  Plan is to proceed with modified barium swallow by radiology team when possible.  Will start patient on IV fluids.  N.p.o. except for meds.  Input from  the speech therapist is highly appreciated.  Patient's son's questions were answered.  Influenza A positive -COVID-19 negative, positive for influenza A - started on Tamiflu 30 mg twice daily, started on 03/07/2022.  Complete 5-day course.   Bibasilar infiltrates: -Procalcitonin came back normal. -Possibly secondary to aspiration. -Speech evaluation. -Patient is on Augmentin. 03/14/2022: Suspect secondary to aspiration.  Oral thrush/throat pain: -Throat pain has improved. -Complete course of oral nystatin. 03/14/2022: Continue nystatin.  Discontinue viscous lidocaine.  Continue to assess closely.  Hypokalemia -Resolved -Potassium today is 3.4. -Continue to monitor and replete.  Volume depletion: -Start IV fluids.  GERD -Continue PPI  Diabetes mellitus type 2, IDDM HbA1c 6.5 Recent Labs    03/13/22 0616 03/13/22 1225 03/13/22 1820 03/13/22 2112 03/14/22 0554 03/14/22 1135  GLUCAP 141* 158* 185* 193* 174* 186*    -CBGs better, continue Semglee 10 units daily, NovoLog 3 units 3 times daily AC, SSI   History of pancreatic CA status post Whipple procedure -Continue Creon 72,000 units 3 times daily with meals -Follows with [name]  History of AL amyloidosis ([place]),  Multiple myeloma ([place]) -Outpatient follow-up with his oncologist, [name] at [facility].  History of prostate CA -Continue outpatient follow-up with urology, Dr. Jeffie Pollock, completed XRT on 06/02/2020    Lesion of parotid gland -MRI brain incidentally also showed 1.8 cm T2 hypointense lesion within the posterior aspect of the right parotid gland, indeterminate.  Recommended nonemergent outpatient ENT evaluation -CTA showed 1.6 cm partially calcified lesion in the posterior aspect of right parotid gland with multiple surrounding smaller lesion, could potentially represent primary parotid neoplasm benign or malignant or enlarged lymph nodes, recommended outpatient ENT evaluation  Code Status:  Full code DVT Prophylaxis:  heparin injection 5,000 Units Start: 03/11/22 2200 SCD's Start: 03/05/22 2235  Level of Care: Level of care: Progressive Family Communication: Discussed with patient Disposition Plan:      Remains inpatient appropriate: PT recommended inpatient rehab.  TOC, CIR assisting and working with wife.  Procedures:  MRI brain, CTA head and neck 2D echo  Consultants:   Neurology Cardiology Electrophysiology. Pulmonary  Antimicrobials: Tamiflu started on 12/24 Medications  amiodarone  400 mg Oral BID   Followed by   Derrill Memo ON 03/26/2022] amiodarone  400 mg Oral Daily   amoxicillin-clavulanate  1 tablet Oral BID   aspirin EC  325 mg Oral Daily   Chlorhexidine Gluconate Cloth  6 each Topical Daily   clopidogrel  75 mg Oral Daily   dapagliflozin propanediol  10 mg Oral Daily   feeding supplement  237 mL Oral BID BM   heparin  5,000 Units Subcutaneous Q8H   insulin aspart  0-9 Units Subcutaneous TID WC   insulin aspart  3 Units Subcutaneous TID WC  insulin glargine-yfgn  10 Units Subcutaneous Daily   lidocaine  1 patch Transdermal Q24H   lipase/protease/amylase  72,000 Units Oral TID WC   megestrol  400 mg Oral Daily   mirabegron ER  25 mg Oral Daily   mupirocin ointment  1 Application Nasal BID   nystatin  5 mL Oral QID   pantoprazole  40 mg Oral Daily   rosuvastatin  10 mg Oral Daily   sodium chloride flush  3 mL Intravenous Q12H   spironolactone  12.5 mg Oral Daily      Subjective:   -No new complaints today.   Objective:   Vitals:   03/13/22 2312 03/14/22 0330 03/14/22 0927 03/14/22 1309  BP: (!) 96/53 105/65 (!) 98/59 (!) 97/48  Pulse: 75 71 76 71  Resp: _0 Temp: 98.9 F (37.2 C) 97.6 F (36.4 C) 98 F (36.7 C) (!) 97 F (36.1 C)  TempSrc: Oral Oral Oral Axillary  SpO2: 93% 94% 94% 96%  Weight:      Height:        Intake/Output Summary (Last 24 hours) at 03/14/2022 1541 Last data filed at 03/14/2022 1306 Gross per 24  hour  Intake 120 ml  Output 1050 ml  Net -930 ml      Wt Readings from Last 3 Encounters:  03/13/22 57.2 kg  01/28/22 66.3 kg  01/14/22 67.2 kg   MPhysical Exam General: Alert and oriented x 3, NAD Cardiovascular: S1 S2 clear, RRR.  Respiratory: Fairly CTA B Gastrointestinal: Soft, nontender, nondistended, NBS Neuro: no new deficits    Data Reviewed:  I have personally reviewed following labs    CBC Lab Results  Component Value Date   WBC 4.7 03/12/2022   RBC 4.55 03/12/2022   HGB 10.9 (L) 03/12/2022   HCT 34.4 (L) 03/12/2022   MCV 75.6 (L) 03/12/2022   MCH 24.0 (L) 03/12/2022   PLT 310 03/12/2022   MCHC 31.7 03/12/2022   RDW 18.6 (H) 03/12/2022   LYMPHSABS 0.6 (L) 03/12/2022   MONOABS 0.2 03/12/2022   EOSABS 0.0 03/12/2022   BASOSABS 0.0 05/39/7673     Last metabolic panel Lab Results  Component Value Date   NA 140 03/14/2022   K 3.4 (L) 03/14/2022   CL 105 03/14/2022   CO2 19 (L) 03/14/2022   BUN 43 (H) 03/14/2022   CREATININE 1.94 (H) 03/14/2022   GLUCOSE 147 (H) 03/14/2022   GFRNONAA 35 (L) 03/14/2022   GFRAA >60 07/13/2018   CALCIUM 8.5 (L) 03/14/2022   PHOS 4.4 03/12/2022   PROT 5.6 (L) 03/06/2022   ALBUMIN 2.6 (L) 03/12/2022   LABGLOB 2.7 06/21/2018   AGRATIO 1.3 06/21/2018   BILITOT 0.5 03/06/2022   ALKPHOS 564 (H) 03/06/2022   AST 28 03/06/2022   ALT 26 03/06/2022   ANIONGAP 16 (H) 03/14/2022    CBG (last 3)  Recent Labs    03/13/22 2112 03/14/22 0554 03/14/22 1135  GLUCAP 193* 174* 186*       Coagulation Profile: No results for input(s): "INR", "PROTIME" in the last 168 hours.    Radiology Studies: I have personally reviewed the imaging studies  CT Chest High Resolution  Result Date: 03/14/2022 CLINICAL DATA:  76 year old male with history of abnormal chest x-ray. Evaluate for pneumonia. EXAM: CT CHEST WITHOUT CONTRAST TECHNIQUE: Multidetector CT imaging of the chest was performed following the standard protocol  without intravenous contrast. High resolution imaging of the lungs, as well as inspiratory and expiratory imaging,  was performed. RADIATION DOSE REDUCTION: This exam was performed according to the departmental dose-optimization program which includes automated exposure control, adjustment of the mA and/or kV according to patient size and/or use of iterative reconstruction technique. COMPARISON:  Chest CTA 12/04/2020. FINDINGS: Cardiovascular: Heart size is borderline enlarged. Small amount of pericardial fluid and/or thickening, unlikely to be of any hemodynamic significance at this time. No pericardial calcification. There is aortic atherosclerosis, as well as atherosclerosis of the great vessels of the mediastinum and the coronary arteries, including calcified atherosclerotic plaque in the left main, left anterior descending, left circumflex and right coronary arteries. Calcifications of the aortic valve. Mediastinum/Nodes: Again noted is bulky mediastinal and bilateral hilar lymphadenopathy, with extensive nodal calcifications, similar to the prior study from 2022. Esophagus is unremarkable in appearance. No axillary lymphadenopathy. Lungs/Pleura: Patchy areas of peribronchovascular predominant ground-glass attenuation, septal thickening, peribronchovascular airspace consolidation and regional architectural distortion noted throughout the lungs bilaterally (right greater than left), most compatible with severe multilobar bilateral bronchopneumonia. This limits assessment for underlying interstitial lung disease on today's examination. Widespread subpleural nodularity noted throughout all aspects of the lungs bilaterally. William bilateral pleural effusions. Upper Abdomen: Coarse calcifications throughout the pancreas, indicative of chronic pancreatitis. Musculoskeletal: There are no aggressive appearing lytic or blastic lesions noted in the visualized portions of the skeleton. IMPRESSION: 1. Study is limited by  what appears to be severe multilobar bilateral (right greater than left) bronchopneumonia. If there is persistent clinical concern for interstitial lung disease, repeat high-resolution chest CT should be considered in 3 months after resolution of the patient's acute illness. 2. Chronic imaging stigmata suggestive of sarcoidosis redemonstrated, as above. 3. Small bilateral pleural effusions lying dependently. 4. Small amount of pericardial fluid and/or thickening, unlikely of hemodynamic significance. No pericardial calcification. 5. Aortic atherosclerosis, in addition to left main and three-vessel coronary artery disease. Assessment for potential risk factor modification, dietary therapy or pharmacologic therapy may be warranted, if clinically indicated. 6. There are calcifications of the aortic valve. Echocardiographic correlation for evaluation of potential valvular dysfunction may be warranted if clinically indicated. 7. Imaging findings compatible with severe chronic pancreatitis. Aortic Atherosclerosis (ICD10-I70.0). Electronically Signed   By: Vinnie Langton M.D.   On: 03/14/2022 08:53       Bonnell Public M.D. [facility] 03/14/2022, 3:41 PM  Available via Epic secure chat 7am-7pm After 7 pm, please refer to night coverage provider listed on amion.

## 2022-03-14 NOTE — Progress Notes (Signed)
Patient ID: William Barron, male   DOB: [birth year], 76 y.o.   MRN: [id number]   Advanced Heart Failure Rounding Note  PCP-Cardiologist: Minus Breeding, MD   Subjective:    No dyspnea at rest.  Remains on 5L HFNC, oxygen saturation 88% currently.  Continues to cough.  Afebrile.   BP stable, BMET not done yet.   Weak, still has eaten very little (none of breakfast).  Able to stand and get to chair but not much else.   Speech/swallow has seen, recommend FEES.  Family did not want, opting instead for barium swallow but cannot do until Tuesday. No obvious aspiration when patient was fed by speech.   He has completed oseltamivir for influenza A.   CT chest: Extensive bilateral multilobar PNA, R>L.  Bulky mediastinal/hilar LAN, seen on prior studies.   Objective:   Weight Range: 57.2 kg Body mass index is 20.98 kg/m.   Vital Signs:   Temp:  [97.6 F (36.4 C)-98.9 F (37.2 C)] 98 F (36.7 C) (12/31 0927) Pulse Rate:  [71-76] 76 (12/31 0927) Resp:  [16-20] 20 (12/31 0927) BP: (96-105)/(53-65) 98/59 (12/31 0927) SpO2:  [93 %-94 %] 94 % (12/31 0927) Last BM Date : 03/10/22  Weight change: Filed Weights   03/08/22 0500 03/11/22 0906 03/13/22 0410  Weight: 64.7 kg 58.5 kg 57.2 kg    Intake/Output:   Intake/Output Summary (Last 24 hours) at 03/14/2022 1031 Last data filed at 03/14/2022 0900 Gross per 24 hour  Intake 120 ml  Output 1425 ml  Net -1305 ml      Physical Exam    General: NAD Neck: No JVD, no thyromegaly or thyroid nodule.  Lungs: Crackles at bases.  CV: Nondisplaced PMI.  Heart regular S1/S2, no S3/S4, no murmur.  No peripheral edema.   Abdomen: Soft, nontender, no hepatosplenomegaly, no distention.  Skin: Intact without lesions or rashes.  Neurologic: Alert and oriented x 3.  Psych: Normal affect. Extremities: No clubbing or cyanosis.  HEENT: Normal.   Telemetry   NSR 70s (personally reviewed)  Labs    CBC Recent Labs    03/11/22 1214  03/12/22 0250  WBC  --  4.7  NEUTROABS  --  3.9  HGB 11.2*  11.2* 10.9*  HCT 33.0*  33.0* 34.4*  MCV  --  75.6*  PLT  --  644   Basic Metabolic Panel Recent Labs    03/12/22 0250 03/13/22 0103  NA 137 136  K 3.8 3.6  CL 102 106  CO2 22 22  GLUCOSE 191* 158*  BUN 36* 46*  CREATININE 1.44* 1.94*  CALCIUM 8.2* 7.9*  MG 2.3  --   PHOS 4.4  --    Liver Function Tests Recent Labs    03/12/22 0250  ALBUMIN 2.6*   No results for input(s): "LIPASE", "AMYLASE" in the last 72 hours. Cardiac Enzymes No results for input(s): "CKTOTAL", "CKMB", "CKMBINDEX", "TROPONINI" in the last 72 hours.  BNP: BNP (last 3 results) Recent Labs    03/07/22 1237  BNP 1,326.2*    ProBNP (last 3 results) No results for input(s): "PROBNP" in the last 8760 hours.   D-Dimer No results for input(s): "DDIMER" in the last 72 hours. Hemoglobin A1C No results for input(s): "HGBA1C" in the last 72 hours. Fasting Lipid Panel No results for input(s): "CHOL", "HDL", "LDLCALC", "TRIG", "CHOLHDL", "LDLDIRECT" in the last 72 hours. Thyroid Function Tests No results for input(s): "TSH", "T4TOTAL", "T3FREE", "THYROIDAB" in the last 72 hours.  Invalid input(s): "FREET3"  Other results:   Imaging    CT Chest High Resolution  Result Date: 03/14/2022 CLINICAL DATA:  76 year old male with history of abnormal chest x-ray. Evaluate for pneumonia. EXAM: CT CHEST WITHOUT CONTRAST TECHNIQUE: Multidetector CT imaging of the chest was performed following the standard protocol without intravenous contrast. High resolution imaging of the lungs, as well as inspiratory and expiratory imaging, was performed. RADIATION DOSE REDUCTION: This exam was performed according to the departmental dose-optimization program which includes automated exposure control, adjustment of the mA and/or kV according to patient size and/or use of iterative reconstruction technique. COMPARISON:  Chest CTA 12/04/2020. FINDINGS:  Cardiovascular: Heart size is borderline enlarged. Small amount of pericardial fluid and/or thickening, unlikely to be of any hemodynamic significance at this time. No pericardial calcification. There is aortic atherosclerosis, as well as atherosclerosis of the great vessels of the mediastinum and the coronary arteries, including calcified atherosclerotic plaque in the left main, left anterior descending, left circumflex and right coronary arteries. Calcifications of the aortic valve. Mediastinum/Nodes: Again noted is bulky mediastinal and bilateral hilar lymphadenopathy, with extensive nodal calcifications, similar to the prior study from 2022. Esophagus is unremarkable in appearance. No axillary lymphadenopathy. Lungs/Pleura: Patchy areas of peribronchovascular predominant ground-glass attenuation, septal thickening, peribronchovascular airspace consolidation and regional architectural distortion noted throughout the lungs bilaterally (right greater than left), most compatible with severe multilobar bilateral bronchopneumonia. This limits assessment for underlying interstitial lung disease on today's examination. Widespread subpleural nodularity noted throughout all aspects of the lungs bilaterally. Trace bilateral pleural effusions. Upper Abdomen: Coarse calcifications throughout the pancreas, indicative of chronic pancreatitis. Musculoskeletal: There are no aggressive appearing lytic or blastic lesions noted in the visualized portions of the skeleton. IMPRESSION: 1. Study is limited by what appears to be severe multilobar bilateral (right greater than left) bronchopneumonia. If there is persistent clinical concern for interstitial lung disease, repeat high-resolution chest CT should be considered in 3 months after resolution of the patient's acute illness. 2. Chronic imaging stigmata suggestive of sarcoidosis redemonstrated, as above. 3. Small bilateral pleural effusions lying dependently. 4. Small amount of  pericardial fluid and/or thickening, unlikely of hemodynamic significance. No pericardial calcification. 5. Aortic atherosclerosis, in addition to left main and three-vessel coronary artery disease. Assessment for potential risk factor modification, dietary therapy or pharmacologic therapy may be warranted, if clinically indicated. 6. There are calcifications of the aortic valve. Echocardiographic correlation for evaluation of potential valvular dysfunction may be warranted if clinically indicated. 7. Imaging findings compatible with severe chronic pancreatitis. Aortic Atherosclerosis (ICD10-I70.0). Electronically Signed   By: Vinnie Langton M.D.   On: 03/14/2022 08:53     Medications:     Scheduled Medications:  amiodarone  400 mg Oral BID   Followed by   Derrill Memo ON 03/26/2022] amiodarone  400 mg Oral Daily   amoxicillin-clavulanate  1 tablet Oral BID   aspirin EC  325 mg Oral Daily   Chlorhexidine Gluconate Cloth  6 each Topical Daily   clopidogrel  75 mg Oral Daily   dapagliflozin propanediol  10 mg Oral Daily   feeding supplement  237 mL Oral BID BM   heparin  5,000 Units Subcutaneous Q8H   insulin aspart  0-9 Units Subcutaneous TID WC   insulin aspart  3 Units Subcutaneous TID WC   insulin glargine-yfgn  10 Units Subcutaneous Daily   lidocaine  1 patch Transdermal Q24H   lipase/protease/amylase  72,000 Units Oral TID WC   megestrol  400 mg Oral Daily   mirabegron ER  25 mg Oral Daily   mupirocin ointment  1 Application Nasal BID   nystatin  5 mL Oral QID   pantoprazole  40 mg Oral Daily   rosuvastatin  10 mg Oral Daily   sodium chloride flush  3 mL Intravenous Q12H   spironolactone  12.5 mg Oral Daily    Infusions:  sodium chloride      PRN Medications: sodium chloride, acetaminophen **OR** acetaminophen (TYLENOL) oral liquid 160 mg/5 mL **OR** acetaminophen, ipratropium-albuterol, lidocaine, sodium chloride flush    Assessment/Plan   1. Acute on chronic systolic CHF:  Echo (1/19) with EF 55-60%.  Echo 12/23 with EF 30-35%, normal RV, normal IVC.  LHC/RHC 12/23 with moderate CAD (60% mLAD, 70% dLAD, 70% D2, 60% dRCA) and low CI 1.87 with normal right and left heart filling pressures.  CAD does not explain fall in EF.  Cardiac MRI showed LV EF 33% with diffuse hypokinesis, RV EF 46% predominantly mid-wall LGE with some subendocardial component in the basal inferolateral wall and focally in the mid lateral wall.  Pattern is not clearly due to prior MI but cannot rule this out given moderate CAD on cath. Alternatively could be due to prior myocarditis. T2 not elevated in the basal inferolateral wall, so do not suspect active inflammation. Findings NOT consistent with cardiac AL amyloidosis. On exam and by recent [place], he is not volume overloaded.  Creatinine up to 1.94 yesterday, pending today.  - With rising creatinine, will hold Entresto for now. Needs BMET sent today.  - Does not need a diuretic.  - Continue dapagliflozin and spironolactone unless creatinine continues to rise.  - Encourage po hydration.   - Would hold off on inotropic support for the time being, think main problem at this time is PNA/lung infiltrates and failure to thrive/malnutrition.  2. CVA: Right occipital CVA with distal right PCA severe stenosis vs occlusion. Presented with visual loss, now improved.  Neurology has signed off.  - ASA 325 + Plavix 75 x 3 months then ASA 81 daily.  - Crestor 10 daily 3. VT arrest: 1 minute CPR with ROSC.  He is on amiodarone.  Suspect triggered by lateral wall scar (prior MI versus myocarditis).  EP has seen, recommend amiodarone and Lifevest at discharge to follow post-discharge course and decide on ICD down the road depending on his trajectory.  - Continue amiodarone po.  4. CAD: LHC this admission with moderate CAD (60% mLAD, 70% dLAD, 70% D2, 60% dRCA).  Out of proportion to cardiomyopathy.  As noted above, cMRI suggested most likely prior myocarditis but cannot  fully rule out prior MI involving basal lateral wall. No chest pain.  - Continue ASA as above.  - Crestor 10 mg daily.  5. Multiple myeloma with AL amyloidosis isolated to lymph nodes: [facility] since 2014. Treated with chemo in past. Has known bulky mediastinal/hilar lymphadenopathy.  6. Prostate cancer: Treated with radiation in 2022.  7. Right eye diplopia: Chronic, 6th nerve palsy.  8. H/o Whipple procedure for IPMN in 5/23.  9. Influenza A: Has been treated with 5 days oseltamivir this admission.  9. Pulmonary: He is still on 5L HFNC, oxygen saturation 88% today.  Was not on home oxygen.  CT chest this admission showed extensive bilateral multilobar PNA, R>L and bulky mediastinal/hilar LAN, seen on prior studies. He has a cough, no fever or leukocytosis and PCT not significantly elevated.  He had a VT arrest with 1 minute CPR, strong suspicion for aspiration pneumonitis.  Pulmonary has seen, no utility to bronchoscopy.   - Continue Augmentin for aspiration coverage.  - Swallow evaluation ongoing, no definite aspiration on bedside eval but needs barium swallow (cannot do until Tuesday).   - Incentive spirometry.  10. Frailty/failure to thrive: Did not recover well at all post-Whipple in 5/23 per family. Here, he his very weak and is eating little.  Having trouble swallowing.  Albumin low, malnourished.  - Nutrition consult.  - Mobilize, PT.  - Added Ensure, encouraged him to drink.   - Too weak currently for CIR.  [place]: With odynophagia.  - Continue Nystatin.   Think he is fairly stable right now from cardiac standpoint, suspect main issue is aspiration pneumonitis and frailty/malnutrition. Concern for poor long-term prognosis with failure to thrive, VT arrest, PNA.  Very weak and frail.  Will need ongoing code discussions, hospice consideration would be reasonable.    Length of Stay: [place], MD  03/14/2022, 10:31 AM  Advanced Heart Failure Team Pager [phone]  (M-F; 7a - 5p)  Please contact [facility] for night-coverage after hours (5p -7a ) and weekends on [email or web address]

## 2022-03-14 NOTE — Progress Notes (Signed)
Speech Language Pathology Treatment: Dysphagia  Patient Details Name: William Barron MRN: [id number] DOB: 25-Jun-1945 Today's Date: 03/14/2022 Time: 5053-9767 SLP Time Calculation (min) (ACUTE ONLY): 28 min  Assessment / Plan / Recommendation Clinical Impression  Pt seen in room with Dr. Marthenia Barron with son William Barron for education.  For now, pt to remain NPO with ice chips and small sips of water for comfort.  Pt may have sips of water with oral medications.  Son is concerned about nutritional needs.  We will plan for MBSS as soon as radiology schedule permits.  Radiology aware of change in PO status, but most likely MBS will be next date.  MD to place orders for IV fluids and glucose for short term support.  Provided education to son about dysphagia management options (diet modifications, compensatory strategies, swallow exercise program) should MBSS show swallowing impairments.  Answered questions as able. Son concerned about post CVA dysphagia, explained with support from MD that location of lesion is more commonly associated with vision rather than changes to swallowing.  Dysphagia is likely not neurogenic.  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  Discussed palliative care with son, who is anxious about consult and states his dad wants to keep fighting.  Encouraged son to participate in meeting which does not mean an immediate withdrawal of care, but may help establish goals for what end of life care looks like in the future and whether long term alternate means of nutrition would be desired.  Pt with hx of bad experience with NG placement in past and son has expressed preference for TPN v PEG.  Results of MBSS should help in goals of care decision making.     HPI HPI: 76 year old male with history of nonobstructive CAD, mild to moderate aortic stenosis, moderate pulmonary regurgitation, pancreatic CA s/p whipple, diabetes, AL amyloidosis without  cardiac involvement who was admitted on 03/05/2022 for acute occipital stroke. On 12/28 (early morning), pt"Went into ventricular tachycardia versus polymorphic and received 1 minute of chest compressions.  He was pulseless.  He did not require defibrillation.  ROSC was achieved within 1 minute.  -Unclear etiology.  Lactic acid is normal.  Electrolytes are normal.  Kidney function is stable.  He has been admitted with stroke but is recovering.  Also complicated by influenza A.  He also has a new onset cardiomyopathy so clearly the combination of influenza and congestive heart failure could have resulted in ventricular arrhythmias"per cardiology note.  CXR indicated RLL PNA which could have been a result of aspiration.  BSE generated to assess swallow function 12/29.  Reconsulted 12/30,  Per MD note: "Bibasal infiltrate noted, query secondary to aspiration.  Repeat speech evaluation."      SLP Plan  MBS      Recommendations for follow up therapy are one component of a multi-disciplinary discharge planning process, led by the attending physician.  Recommendations may be updated based on patient status, additional functional criteria and insurance authorization.    Recommendations  Diet recommendations: NPO Medication Administration: Whole meds with liquid Compensations: Slow rate;Small sips/bites                Oral Care Recommendations: Oral care QID Follow Up Recommendations:  (pending MBSS) SLP Visit Diagnosis: Dysphagia, unspecified (R13.10) Plan: MBS           William Barron, [place], [place] Office: [phone] 03/14/2022, 12:26 PM

## 2022-03-14 NOTE — Progress Notes (Signed)
Speech Language Pathology Treatment: Dysphagia  Patient Details Name: William Barron MRN: [id number] DOB: 07-30-1945 Today's Date: 03/14/2022 Time: 8185-6314 SLP Time Calculation (min) (ACUTE ONLY): 10 min  Assessment / Plan / Recommendation Clinical Impression  SLP returned for further swallowing assessment.  RN messaged that pt was having difficulty with pills.  There was coughing and expectoration with pills in puree.  Pt wishes to take pills with water.  There was frequent grimacing and some moaning with pill administration.  Pt appeared to have intermittent difficulty siphoning from straw (air leak heard). There was delayed congested cough x2.  It is unclear if this is 2/2 possible pneumonia/pneumonitis, an esophageal dysphagia, or if it was related to PO intake, although delay from swallow to cough was significant.    Spoke with wife William Barron and son William Barron on phone.  They decline FEES.  Pt had traumatic experience with NG for suction during hospitalization at [facility] and would likely not tolerate scope passage.  We will plan for MBS when available.  Will defer diet decision to team.  If pt is considered to be low risk for ongoing aspiration pt could continue current oral diet based on clinical presentation.  If there is greater concern for silent aspiration and pt is considered to be high risk, recommend pt be made NPO pending results of instrumental evaluation.    HPI HPI: 76 year old male with history of nonobstructive CAD, mild to moderate aortic stenosis, moderate pulmonary regurgitation, pancreatic CA s/p whipple, diabetes, 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 On 12/28 (early morning), pt"Went into ventricular tachycardia versus polymorphic and received 1 minute of chest compressions.  He was pulseless.  He did not require defibrillation.  ROSC was achieved within 1 minute.  -Unclear etiology.  Lactic acid is normal.   Electrolytes are normal.  Kidney function is stable.  He has been admitted with stroke but is recovering.  Also complicated by influenza A.  He also has a new onset cardiomyopathy so clearly the combination of influenza and congestive heart failure could have resulted in ventricular arrhythmias"per cardiology note.  CXR indicated RLL PNA which could have been a result of aspiration.  BSE generated to assess swallow function 12/29.  Reconsulted 12/30,  Per MD note: "Bibasal infiltrate noted, query secondary to aspiration.  Repeat speech evaluation."      SLP Plan  MBS      Recommendations for follow up therapy are one component of a multi-disciplinary discharge planning process, led by the attending physician.  Recommendations may be updated based on patient status, additional functional criteria and insurance authorization.    Recommendations  Diet recommendations:  (Defer diet decision to team) Medication Administration: Whole meds with liquid Compensations: Slow rate;Small sips/bites                Oral Care Recommendations: Oral care BID Follow Up Recommendations:  (TBD) SLP Visit Diagnosis: Dysphagia, unspecified (R13.10) Plan: MBS           Celedonio Savage, [place], [place] Office: [phone] 03/14/2022, 11:20 AM

## 2022-03-14 NOTE — Progress Notes (Signed)
PCCM:  Reviewed CT chest that was ordered yesterday.  Discussed recs with Dr. Marthenia Rolling.  Pulm will sign off. Please call with any questions.   Thanks  Garner Nash, DO [facility] 03/14/2022 9:54 AM

## 2022-03-14 NOTE — Evaluation (Signed)
Clinical/Bedside Swallow Evaluation Patient Details  Name: William Barron MRN: [id number] Date of Birth: 22-Jan-1946  Today's Date: 03/14/2022 Time: SLP Start Time (ACUTE ONLY): 3300 SLP Stop Time (ACUTE ONLY): 0903 SLP Time Calculation (min) (ACUTE ONLY): 9 min  Past Medical History:  Past Medical History:  Diagnosis Date   Amyloidosis ([place])    Diabetes mellitus without complication ([place])    Feeding tube dysfunction, initial encounter 08/12/2021   GERD (gastroesophageal reflux disease)    Hearing loss    Has hearing aids   History of kidney stones    Hyperlipidemia    Jejunostomy tube present ([place])    Multiple myeloma ([place])    and amylodosis    Nephrolithiasis    Occasional tremors    Pancreatic insufficiency    Pneumonia    Prostate cancer ([place])    Prostatitis    acute and chronic   Skin cancer    Past Surgical History:  Past Surgical History:  Procedure Laterality Date   APPENDECTOMY     CARDIAC CATHETERIZATION N/A 06/13/2015   Procedure: Left Heart Cath and Coronary Angiography;  Surgeon: Jettie Booze, MD;  Location: [facility];  Service: Cardiovascular;  Laterality: N/A;   COLONOSCOPY  04/07/2000   ELBOW SURGERY  03/15/2001   right   ESOPHAGOGASTRODUODENOSCOPY (EGD) WITH PROPOFOL N/A 09/07/2021   Procedure: ESOPHAGOGASTRODUODENOSCOPY (EGD) WITH PROPOFOL;  Surgeon: Irene Shipper, MD;  Location: [facility];  Service: Gastroenterology;  Laterality: N/A;   EXTRACORPOREAL SHOCK WAVE LITHOTRIPSY Left 07/17/2018   Procedure: EXTRACORPOREAL SHOCK WAVE LITHOTRIPSY (ESWL);  Surgeon: [name], MD;  Location: [facility];  Service: Urology;  Laterality: Left;   EXTRACORPOREAL SHOCK WAVE LITHOTRIPSY Right 12/08/2020   Procedure: RIGHT EXTRACORPOREAL SHOCK WAVE LITHOTRIPSY (ESWL);  Surgeon: Raynelle Bring, MD;  Location: [facility];  Service: Urology;  Laterality: Right;   FOOT ARTHRODESIS Right 04/15/2021   Procedure: RIGHT SUBTALAR AND  TALONAVICULAR FUSION;  Surgeon: Newt Minion, MD;  Location: [facility];  Service: Orthopedics;  Laterality: Right;   HERNIA REPAIR     IR MECH REMOV OBSTRUC MAT ANY COLON TUBE W/FLUORO  08/27/2021   IR REPLC GASTRO/COLONIC TUBE PERCUT W/FLUORO  09/08/2021   RIGHT/LEFT HEART CATH AND CORONARY ANGIOGRAPHY N/A 03/11/2022   Procedure: RIGHT/LEFT HEART CATH AND CORONARY ANGIOGRAPHY;  Surgeon: Jettie Booze, MD;  Location: [facility];  Service: Cardiovascular;  Laterality: N/A;   ROTATOR CUFF REPAIR     WHIPPLE PROCEDURE  07/13/2021   HPI:  76 year old male with history of nonobstructive CAD, mild to moderate aortic stenosis, moderate pulmonary regurgitation, pancreatic CA s/p whipple, diabetes, 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 On 12/28 (early morning), pt"Went into ventricular tachycardia versus polymorphic and received 1 minute of chest compressions.  He was pulseless.  He did not require defibrillation.  ROSC was achieved within 1 minute.  -Unclear etiology.  Lactic acid is normal.  Electrolytes are normal.  Kidney function is stable.  He has been admitted with stroke but is recovering.  Also complicated by influenza A.  He also has a new onset cardiomyopathy so clearly the combination of influenza and congestive heart failure could have resulted in ventricular arrhythmias"per cardiology note.  CXR indicated RLL PNA which could have been a result of aspiration.  BSE generated to assess swallow function 12/29.  Reconsulted 12/30,  Per MD note: "Bibasal infiltrate noted, query secondary to aspiration.  Repeat speech evaluation."  Assessment / Plan / Recommendation  Clinical Impression  Pt seen for repeat clinical swallow evaluation in setting of bibasilar infiltrate.  He reports odynophagia is improving.  Pt had difficutly following directions for OME and answers to questions were at time inappropriate.  Decreased mandibular  excursion limited visualization of oral cavity.  Pt tolerated all consistencies trialed with no clinical s/s of aspiration; however a concern for prandial aspiration still exists given chest imaging findings.  Radiology is unable to accommodate MBSS this date, and possibly not until Tuesday. Pt would likely need FEES for further assessment of swallow function before then.  Recommend continuing current diet pending instrumental assessment. SLP Visit Diagnosis: Dysphagia, unspecified (R13.10)    Aspiration Risk  Mild aspiration risk    Diet Recommendation Regular;Thin liquid   Liquid Administration via: Straw;Cup Medication Administration: Whole meds with puree Compensations: Slow rate;Small sips/bites Postural Changes: Seated upright at 90 degrees    Other  Recommendations Oral Care Recommendations: Oral care BID    Recommendations for follow up therapy are one component of a multi-disciplinary discharge planning process, led by the attending physician.  Recommendations may be updated based on patient status, additional functional criteria and insurance authorization.  Follow up Recommendations  (TBD)      Assistance Recommended at Discharge  TBD  Functional Status Assessment  (TBD)  Frequency and Duration  (TBD)          Prognosis Prognosis for Safe Diet Advancement:  (TBD)      Swallow Study   General HPI: 76 year old male with history of nonobstructive CAD, mild to moderate aortic stenosis, moderate pulmonary regurgitation, pancreatic CA s/p whipple, diabetes, 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 On 12/28 (early morning), pt"Went into ventricular tachycardia versus polymorphic and received 1 minute of chest compressions.  He was pulseless.  He did not require defibrillation.  ROSC was achieved within 1 minute.  -Unclear etiology.  Lactic acid is normal.  Electrolytes are normal.  Kidney function is stable.  He has  been admitted with stroke but is recovering.  Also complicated by influenza A.  He also has a new onset cardiomyopathy so clearly the combination of influenza and congestive heart failure could have resulted in ventricular arrhythmias"per cardiology note.  CXR indicated RLL PNA which could have been a result of aspiration.  BSE generated to assess swallow function 12/29.  Reconsulted 12/30,  Per MD note: "Bibasal infiltrate noted, query secondary to aspiration.  Repeat speech evaluation."    Oral/Motor/Sensory Function Overall Oral Motor/Sensory Function:  (Could not test 2/2 cognitive impairments)   Ice Chips Ice chips: Within functional limits   Thin Liquid Thin Liquid: Within functional limits Presentation: Straw    Nectar Thick Nectar Thick Liquid: Not tested   Honey Thick Honey Thick Liquid: Not tested   Puree Puree: Within functional limits Presentation: Self Fed   Solid     Solid: Within functional limits Presentation: [place], [place], [place] Office: [phone] 03/14/2022,9:26 AM

## 2022-03-14 NOTE — Progress Notes (Signed)
        Consult received. Chart reviewed. Spoke with wife/Rebecca and son/[name] in efforts to discuss [place]. Plan for family meeting tomorrow 03/15/21 at 9:30 am.    Elie Confer, NP-C Palliative Medicine   Please call Palliative Medicine team phone with any questions [phone]. For individual providers please see AMION.   No charge

## 2022-03-14 NOTE — Progress Notes (Signed)
Mobility Specialist Progress Note:   03/14/22 0840  Mobility  Activity Transferred from bed to chair  Level of Assistance Moderate assist, patient does 50-74%  Assistive Device Front wheel walker  Distance Ambulated (ft) 2 ft  Activity Response Tolerated well  $Mobility charge 1 Mobility   Pt in bed willing to get up to chair. No complaints of pain. MinA for bed mobility. ModA to stand and step to recliner. Left in chair with call bell in reach and all needs met.   Gareth Eagle Gloria Lambertson Mobility Specialist Please contact via [employer] or  Rehab Office at [phone]

## 2022-03-15 ENCOUNTER — Inpatient Hospital Stay (HOSPITAL_COMMUNITY): Payer: No Typology Code available for payment source

## 2022-03-15 DIAGNOSIS — Z515 Encounter for palliative care: Secondary | ICD-10-CM

## 2022-03-15 DIAGNOSIS — Z90410 Acquired total absence of pancreas: Secondary | ICD-10-CM | POA: Diagnosis not present

## 2022-03-15 DIAGNOSIS — I5021 Acute systolic (congestive) heart failure: Secondary | ICD-10-CM | POA: Diagnosis not present

## 2022-03-15 DIAGNOSIS — I469 Cardiac arrest, cause unspecified: Secondary | ICD-10-CM | POA: Diagnosis not present

## 2022-03-15 DIAGNOSIS — I639 Cerebral infarction, unspecified: Secondary | ICD-10-CM | POA: Diagnosis not present

## 2022-03-15 DIAGNOSIS — Z7189 Other specified counseling: Secondary | ICD-10-CM

## 2022-03-15 LAB — CBC WITH DIFFERENTIAL/PLATELET
Abs Immature Granulocytes: 0.03 10*3/uL (ref 0.00–0.07)
Basophils Absolute: 0 10*3/uL (ref 0.0–0.1)
Basophils Relative: 0 %
Eosinophils Absolute: 0 10*3/uL (ref 0.0–0.5)
Eosinophils Relative: 0 %
HCT: 33.5 % — ABNORMAL LOW (ref 39.0–52.0)
Hemoglobin: 10 g/dL — ABNORMAL LOW (ref 13.0–17.0)
Immature Granulocytes: 1 %
Lymphocytes Relative: 12 %
Lymphs Abs: 0.6 10*3/uL — ABNORMAL LOW (ref 0.7–4.0)
MCH: 23.2 pg — ABNORMAL LOW (ref 26.0–34.0)
MCHC: 29.9 g/dL — ABNORMAL LOW (ref 30.0–36.0)
MCV: 77.7 fL — ABNORMAL LOW (ref 80.0–100.0)
Monocytes Absolute: 0.2 10*3/uL (ref 0.1–1.0)
Monocytes Relative: 4 %
Neutro Abs: 4 10*3/uL (ref 1.7–7.7)
Neutrophils Relative %: 83 %
Platelets: 338 10*3/uL (ref 150–400)
RBC: 4.31 MIL/uL (ref 4.22–5.81)
RDW: 18.6 % — ABNORMAL HIGH (ref 11.5–15.5)
WBC: 4.8 10*3/uL (ref 4.0–10.5)
nRBC: 0 % (ref 0.0–0.2)

## 2022-03-15 LAB — RENAL FUNCTION PANEL
Albumin: 2.3 g/dL — ABNORMAL LOW (ref 3.5–5.0)
Anion gap: 11 (ref 5–15)
BUN: 40 mg/dL — ABNORMAL HIGH (ref 8–23)
CO2: 19 mmol/L — ABNORMAL LOW (ref 22–32)
Calcium: 7.8 mg/dL — ABNORMAL LOW (ref 8.9–10.3)
Chloride: 109 mmol/L (ref 98–111)
Creatinine, Ser: 1.72 mg/dL — ABNORMAL HIGH (ref 0.61–1.24)
GFR, Estimated: 40 mL/min — ABNORMAL LOW (ref >60)
Glucose, Bld: 151 mg/dL — ABNORMAL HIGH (ref 70–99)
Phosphorus: 2.6 mg/dL (ref 2.5–4.6)
Potassium: 3.7 mmol/L (ref 3.5–5.1)
Sodium: 139 mmol/L (ref 135–145)

## 2022-03-15 LAB — GLUCOSE, CAPILLARY
Glucose-Capillary: 128 mg/dL — ABNORMAL HIGH (ref 70–99)
Glucose-Capillary: 113 mg/dL — ABNORMAL HIGH (ref 70–99)
Glucose-Capillary: 113 mg/dL — ABNORMAL HIGH (ref 70–99)
Glucose-Capillary: 125 mg/dL — ABNORMAL HIGH (ref 70–99)

## 2022-03-15 LAB — MAGNESIUM: Magnesium: 2.3 mg/dL (ref 1.7–2.4)

## 2022-03-15 MED ORDER — KCL IN DEXTROSE-NACL 20-5-0.9 MEQ/L-%-% IV SOLN
INTRAVENOUS | Status: DC
  Filled 2022-03-15: qty 1000

## 2022-03-15 NOTE — Progress Notes (Signed)
Patient ID: William Barron, male   DOB: [birth year], 77 y.o.   MRN: [id number]   Advanced Heart Failure Rounding Note  PCP-Cardiologist: Minus Breeding, MD   Subjective:    No dyspnea at rest.  Remains on 6L HFNC.  Continues to cough.  Afebrile.   BP stable, getting D5NS 75 cc/hr as NPO.  Creatinine down to 1.72.   Very weak.    Speech/swallow has seen, plan for barium swallow today. No obvious aspiration when patient was fed by speech but strong suspicion so currently NPO with ice chips/meds.   He has completed oseltamivir for influenza A.   CT chest: Extensive bilateral multilobar PNA, R>L.  Bulky mediastinal/hilar LAN, seen on prior studies.   Objective:   Weight Range: 57.2 kg Body mass index is 20.98 kg/m.   Vital Signs:   Temp:  [97 F (36.1 C)-98.5 F (36.9 C)] 98.2 F (36.8 C) (01/01 0840) Pulse Rate:  [62-71] 62 (12/31 2353) Resp:  [18-22] 18 (01/01 0800) BP: (96-112)/(48-57) 105/50 (01/01 0840) SpO2:  [92 %-100 %] 94 % (01/01 0800) Last BM Date : 03/10/22 (charted wrong)  Weight change: Filed Weights   03/08/22 0500 03/11/22 0906 03/13/22 0410  Weight: 64.7 kg 58.5 kg 57.2 kg    Intake/Output:   Intake/Output Summary (Last 24 hours) at 03/15/2022 1106 Last data filed at 03/15/2022 0607 Gross per 24 hour  Intake 727.77 ml  Output 1050 ml  Net -322.23 ml      Physical Exam    General: NAD, frail Neck: No JVD, no thyromegaly or thyroid nodule.  Lungs: Decreased BS at bases.  CV: Nondisplaced PMI.  Heart regular S1/S2, no S3/S4, no murmur.  No peripheral edema.   Abdomen: Soft, nontender, no hepatosplenomegaly, no distention.  Skin: Intact without lesions or rashes.  Neurologic: Alert and oriented x 3.  Psych: Normal affect. Extremities: No clubbing or cyanosis.  HEENT: Normal.    Telemetry   NSR 70s (personally reviewed)  Labs    CBC Recent Labs    03/15/22 0059  WBC 4.8  NEUTROABS 4.0  HGB 10.0*  HCT 33.5*  MCV 77.7*  PLT 185    Basic Metabolic Panel Recent Labs    03/14/22 1021 03/15/22 0059 03/15/22 0100  NA 140  --  139  K 3.4*  --  3.7  CL 105  --  109  CO2 19*  --  19*  GLUCOSE 147*  --  151*  BUN 43*  --  40*  CREATININE 1.94*  --  1.72*  CALCIUM 8.5*  --  7.8*  MG  --  2.3  --   PHOS  --   --  2.6   Liver Function Tests Recent Labs    03/15/22 0100  ALBUMIN 2.3*   No results for input(s): "LIPASE", "AMYLASE" in the last 72 hours. Cardiac Enzymes No results for input(s): "CKTOTAL", "CKMB", "CKMBINDEX", "TROPONINI" in the last 72 hours.  BNP: BNP (last 3 results) Recent Labs    03/07/22 1237  BNP 1,326.2*    ProBNP (last 3 results) No results for input(s): "PROBNP" in the last 8760 hours.   D-Dimer No results for input(s): "DDIMER" in the last 72 hours. Hemoglobin A1C No results for input(s): "HGBA1C" in the last 72 hours. Fasting Lipid Panel No results for input(s): "CHOL", "HDL", "LDLCALC", "TRIG", "CHOLHDL", "LDLDIRECT" in the last 72 hours. Thyroid Function Tests No results for input(s): "TSH", "T4TOTAL", "T3FREE", "THYROIDAB" in the last 72 hours.  Invalid input(s): "FREET3"  Other results:   Imaging    No results found.   Medications:     Scheduled Medications:  amiodarone  400 mg Oral BID   Followed by   Derrill Memo ON 03/26/2022] amiodarone  400 mg Oral Daily   amoxicillin-clavulanate  1 tablet Oral BID   aspirin EC  325 mg Oral Daily   Chlorhexidine Gluconate Cloth  6 each Topical Daily   clopidogrel  75 mg Oral Daily   dapagliflozin propanediol  10 mg Oral Daily   feeding supplement  237 mL Oral BID BM   heparin  5,000 Units Subcutaneous Q8H   insulin aspart  0-9 Units Subcutaneous TID WC   insulin aspart  3 Units Subcutaneous TID WC   insulin glargine-yfgn  10 Units Subcutaneous Daily   lidocaine  1 patch Transdermal Q24H   lipase/protease/amylase  72,000 Units Oral TID WC   megestrol  400 mg Oral Daily   mirabegron ER  25 mg Oral Daily   mupirocin  ointment  1 Application Nasal BID   nystatin  5 mL Oral QID   pantoprazole  40 mg Oral Daily   rosuvastatin  10 mg Oral Daily   sodium chloride flush  3 mL Intravenous Q12H   spironolactone  12.5 mg Oral Daily    Infusions:  sodium chloride      PRN Medications: sodium chloride, acetaminophen **OR** acetaminophen (TYLENOL) oral liquid 160 mg/5 mL **OR** acetaminophen, ipratropium-albuterol, sodium chloride flush    Assessment/Plan   1. Acute on chronic systolic CHF: Echo (4/92) with EF 55-60%.  Echo 12/23 with EF 30-35%, normal RV, normal IVC.  LHC/RHC 12/23 with moderate CAD (60% mLAD, 70% dLAD, 70% D2, 60% dRCA) and low CI 1.87 with normal right and left heart filling pressures.  CAD does not explain fall in EF.  Cardiac MRI showed LV EF 33% with diffuse hypokinesis, RV EF 46% predominantly mid-wall LGE with some subendocardial component in the basal inferolateral wall and focally in the mid lateral wall.  Pattern is not clearly due to prior MI but cannot rule this out given moderate CAD on cath. Alternatively could be due to prior myocarditis. T2 not elevated in the basal inferolateral wall, so do not suspect active inflammation. Findings NOT consistent with cardiac AL amyloidosis. On exam and by recent [place], he is not volume overloaded.  Creatinine lower at 1.72 today.  - With elevated creatinine, will hold off on Entresto.  - Does not need a diuretic.  - Continue dapagliflozin and spironolactone.  - Would hold off on inotropic support for the time being, think main problem at this time is PNA/lung infiltrates and failure to thrive/malnutrition.  2. CVA: Right occipital CVA with distal right PCA severe stenosis vs occlusion. Presented with visual loss, now improved.  Neurology has signed off.  - ASA 325 + Plavix 75 x 3 months then ASA 81 daily.  - Crestor 10 daily 3. VT arrest: 1 minute CPR with ROSC.  He is on amiodarone.  Suspect triggered by lateral wall scar (prior MI versus  myocarditis).  EP has seen, recommend amiodarone and Lifevest at discharge to follow post-discharge course and decide on ICD down the road depending on his trajectory.  - Continue amiodarone po.  4. CAD: LHC this admission with moderate CAD (60% mLAD, 70% dLAD, 70% D2, 60% dRCA).  Out of proportion to cardiomyopathy.  As noted above, cMRI suggested most likely prior myocarditis but cannot fully rule out prior MI involving basal lateral wall. No chest  pain.  - Continue ASA as above.  - Crestor 10 mg daily.  5. Multiple myeloma with AL amyloidosis isolated to lymph nodes: [facility]. Treated with chemo in past. Has known bulky mediastinal/hilar lymphadenopathy.  6. Prostate cancer: Treated with radiation in 2022.  7. Right eye diplopia: Chronic, 6th nerve palsy.  8. H/o Whipple procedure for IPMN in 5/23.  9. Influenza A: Has been treated with 5 days oseltamivir this admission.  9. Pulmonary: He is still on 5L HFNC, oxygen saturation 88% today.  Was not on home oxygen.  CT chest this admission showed extensive bilateral multilobar PNA, R>L and bulky mediastinal/hilar LAN, seen on prior studies. He has a cough, no fever or leukocytosis and PCT not significantly elevated.  He had a VT arrest with 1 minute CPR, strong suspicion for aspiration pneumonitis.  Pulmonary has seen, no utility to bronchoscopy.   - Continue Augmentin for aspiration coverage.  - Swallow evaluation ongoing, no definite aspiration on bedside eval but needs barium swallow.   - Incentive spirometry.  - NPO with sips for meds for now.  10. Frailty/failure to thrive: Did not recover well at all post-Whipple in 5/23 per family.  Has had poor appetite.  Having trouble swallowing.  Albumin low, malnourished. He is currently NPO getting sips with meds.   - Mobilize, PT.  - Too weak currently for CIR.  - Needs some form of nutrition unless family is opts for hospice care => ?Cortrak.  [place]: With odynophagia.  -  Continue Nystatin.   Think he is fairly stable right now from cardiac standpoint, suspect main issue is aspiration pneumonitis and frailty/malnutrition. Concern for poor long-term prognosis with failure to thrive, VT arrest, PNA.  Very weak and frail.  Will need ongoing code discussions, hospice consideration would be reasonable.    Length of Stay: [place], MD  03/15/2022, 11:06 AM  Advanced Heart Failure Team Pager [phone] (M-F; 7a - 5p)  Please contact [facility] for night-coverage after hours (5p -7a ) and weekends on [email or web address]

## 2022-03-15 NOTE — Consult Note (Cosign Needed Addendum)
Palliative Care Consult Note                                  Date: 03/15/2022   Patient Name: William Barron  DOB: [birth year]  MRN: [id number]  Age / Sex: 77 y.o., male  PCP: William Hatchet, MD Referring Physician: Geradine Girt, DO  Reason for Consultation: Establishing goals of care  HPI/Patient Profile: 77 y.o. male  with past medical history of multiple myeloma and amyloidosis (followed by oncology at [facility]), pancreatic cyst status post Whipple procedure May 2023, and diabetes mellitus type 2 who presented to [facility] ED on 03/05/2022 with right-sided vision changes and headache. CT head showed acute/subacute right occipital CVA.   On 12/24, he was found to be positive for influenza A.  On 12/28, he was found unresponsive and pulseless. It was determined he had gone into VT/VF cardiac arrest.  He was successfully resuscitated and underwent left and right cardiac catheterization. He was found to have new-onset systolic heart failure with reduced EF 35-40%.  On 12/30, CT chest showed severe multilobar bilateral (right greater than left) pneumonia.  Palliative Medicine has been consulted for goals of care.  Past Medical History:  Diagnosis Date   Amyloidosis ([place])    Diabetes mellitus without complication ([place])    Hearing loss    Has hearing aids   History of kidney stones    Hyperlipidemia    Multiple myeloma (HCC)    and amylodosis    Nephrolithiasis    Occasional tremors    Pancreatic insufficiency    Pneumonia    Prostate cancer ([place])    Skin cancer     Subjective:   I have reviewed medical records including progress notes, labs and imaging, and assessed the patient at bedside. He is awake and interactive, but it is unclear whether he has capacity to participate in complex medical decision making.   I met with son/William Barron (in person) and wife/William Barron (on the phone) to discuss diagnosis, prognosis, GOC, disposition, and  options.  William Barron lives in [place] and flew in several days ago.  William Barron Guiles shares that she has been ill with the flu and still not feeling well enough to visit in person.  Patient "William Barron" is known to PMT from his previous hospitalization in June 2023.  I re-introduced Palliative Medicine as specialized medical care for people living with serious illness. It focuses on providing relief from the symptoms and stress of a serious illness.   We discussed William Barron's current illness and what it means in the larger context of his ongoing co-morbidities. Discussed that he has multiple medical problems including multiple myeloma, amyloidosis, diabetes, new-onset heart failure, and pneumonia.   Current clinical status was reviewed. William Barron understands that per cardiology, his dad's heart is "weak but stable". Discussed that the more concerning issues are pneumonia and acute respiratory failure as well as as malnutrition. Also discussed concern for aspiration risk and plan for MBS study today. If William Barron does not pass the swallow study, family is agreeable to pursue artificial feeding.   Created space and opportunity for family to express thoughts and feelings regarding current medical situation. Values and goals of care were attempted to be elicited.  Family expresses frustration regarding the Whipple procedure done on 07/13/2021 (they do not feel it should have been done). They report this was the major turning point in William Barron's overall health and functional status.   William Barron shares  that he is not nave to the seriousness of his father's overall medical condition. He understands that William Barron is very fragile and at high risk for complications.  However at this time, family feels that William Barron is still "fighting" and wish to continue with full scope interventions. They want to allow time for outcomes and are ultimately hopeful for improvement.    We did discuss code status. During hospitalization in June 2023, patient was DNR/DNI. Encouraged  family to re-consider DNR/DNI status understanding evidenced based poor outcomes in similar hospitalized patients, as the cause of the arrest is likely associated with chronic/terminal disease rather than a reversible acute cardio-pulmonary event. They seem to indicate agreement that DNR is appropriate however wish to discuss further and are not ready to make that decision today.  Questions and concerns addressed. Patient/family encouraged to call with questions or concerns.     Review of Systems  Respiratory:  Positive for cough.   Neurological:  Positive for weakness.    Objective:   Primary Diagnoses: Present on Admission:  Occipital stroke ([place])  Multiple myeloma (HCC)  Amyloidosis ([place])  Lesion of parotid gland  Dyslipidemia  Acute systolic CHF (congestive heart failure) (HCC)  Influenza A with pneumonia   Physical Exam Vitals reviewed.  Constitutional:      General: He is not in acute distress.    Appearance: He is ill-appearing.     Comments: Frail  Pulmonary:     Effort: Pulmonary effort is normal.  Neurological:     Mental Status: He is alert.     Motor: Weakness present.     Vital Signs:  BP (!) 105/50 (BP Location: Left Arm)   Pulse 62   Temp 98.2 F (36.8 C) (Oral)   Resp 18   Ht _0  (1.651 m)   Wt 57.2 kg   SpO2 94%   BMI 20.98 kg/m   Palliative Assessment/Data: PPS 30%     Assessment & Plan:   SUMMARY OF RECOMMENDATIONS   Full code for now with ongoing discussion Continue full scope interventions Family understands the seriousness of patient's current medical situation, but are hopeful for improvement MBS study pending today PMT will continue to follow  Primary Decision Maker: Wife and son  Symptom Management:  Nystatin mouthwash 4 times daily for thrush  Prognosis:  Unable to determine  Discharge Planning:  To Be Determined   Discussed with: Dr. Eliseo Squires and SLP    Thank you for allowing [place] to participate in the care of  William Barron   Time Total: 95 minutes  Greater than 50%  of this time was spent counseling and coordinating care related to the above assessment and plan.  Signed by: Elie Confer, NP Palliative Medicine Team  Team Phone # [phone]  For individual providers, please see AMION

## 2022-03-15 NOTE — Progress Notes (Signed)
[facility]                                                                              William Barron, is a 77 y.o. male, DOB - 11/29/1945, GLO:[id number] Admit date - 03/05/2022    Outpatient Primary MD for the patient is [name], MD  LOS - 9  days  Chief Complaint  Patient presents with   Headache       Brief summary   Patient is a 77 year old male with history of pancreatic CA status post Whipple, prostate CA, multiple myeloma, amyloidosis, dyslipidemia, DM, presented to ED with right-sided headache and blurred vision.  Last known well was in the evening of 03/04/2022.  He woke up on the morning of admission on 12/22 with difficulty seeing out of his right eye.  He went to see an ophthalmologist and was referred to ER CT head showed acute/subacute right occipital CVA   03/11/2022: Patient was found unresponsive and pulseless around 4:45 AM.  Patient was found to have gone into VT/VF cardiac arrest in setting of low EF 30 to 35%.  Patient has been successfully resuscitated.  Patient underwent left and right heart cardiac catheterization afterwards.   1/1- did well with MBS- diet order placed   Assessment & Plan    Acute right PCA stroke (HCC) right CN VI palsy, chronic -Presented with visual issues, having difficulty seeing half of the clock.  CT head showed right PCA territory stroke. -MRI brain showed acute to early subacute right PCA territory infarct.  Associated mild petechial blood products without frank hemorrhagic transformation or significant residual mass effect.  Underlying moderate chronic microvascular ischemic disease -CTA head and neck showed acute or early subacute infarct in the right occipital lobe, suspected high-grade stenosis versus occlusion of the distal right PCA. -2D echo: EF of 35 to 40% with global hypokinesis, mild AS, mild pulmonary regurg, mild MR, mild AR (EF 50 to 55% on echo in 11/2021) -Per neuro, continue aspirin 325  mg and Plavix 75 mg DAPT for 3 months given intracranial stenosis/occlusion then aspirin 81 mg alone daily indefinitely  -Lipid panel showed LDL 66, at goal, no need of statin per neurology -Hemoglobin A1c 6.5 -Loop recorder per neuro, timing per cardiology?   New onset systolic CHF, EF 35 to 18%  -2D echo showed EF of 35 to 40% with global hypokinesis (was 50 to 55% on echo in 11/2021) -Continue Coreg, losartan 12.5 mg daily, Farxiga 10 mg daily  -Lasix as needed -cards consult:  - With elevated creatinine, will hold off on Entresto.  - Does not need a diuretic.  - Continue dapagliflozin and spironolactone.  - Would hold off on inotropic support for the time being, think main problem at this time is PNA/lung infiltrates and failure to thrive/malnutrition.    VT arrest:  - amiodarone.  Suspect triggered by lateral wall scar (prior MI versus myocarditis).  EP has seen, recommend amiodarone and Lifevest at discharge to follow post-discharge course and decide on ICD down the road depending on his trajectory.  - Continue amiodarone po.   Influenza A positive -COVID-19 negative, positive for influenza  A -treat with tamiflu  Bibasilar infiltrates: -Procalcitonin came back normal. -Speech evaluation. -abx -pulmonary toiletry  Oral thrush/throat pain: -Throat pain has improved. -Complete course of oral nystatin.   Hypokalemia -Replete  GERD -Continue PPI  Diabetes mellitus type 2, IDDM -SSI  History of pancreatic CA status post Whipple procedure -Continue Creon 72,000 units 3 times daily with meals -Follows with [name]  History of AL amyloidosis ([place]),  Multiple myeloma ([place]) -Outpatient follow-up with his oncologist, [name] at [facility].  History of prostate CA -Continue outpatient follow-up with urology, Dr. Jeffie Pollock, completed XRT on 06/02/2020    Lesion of parotid gland -MRI brain incidentally also showed 1.8 cm T2 hypointense lesion within the  posterior aspect of the right parotid gland, indeterminate.  Recommended nonemergent outpatient ENT evaluation -CTA showed 1.6 cm partially calcified lesion in the posterior aspect of right parotid gland with multiple surrounding smaller lesion, could potentially represent primary parotid neoplasm benign or malignant or enlarged lymph nodes, recommended outpatient ENT evaluation   Code Status: Full code DVT Prophylaxis:  heparin injection 5,000 Units Start: 03/11/22 2200 SCD's Start: 03/05/22 2235  Level of Care: Level of care: Progressive Family Communication: Discussed with patient Disposition Plan:      Remains inpatient appropriate: PT recommended inpatient rehab.  TOC, CIR assisting and working with wife.    Consultants:   Neurology Cardiology Electrophysiology. Pulmonary Palliative care   Medications  amiodarone  400 mg Oral BID   Followed by   Derrill Memo ON 03/26/2022] amiodarone  400 mg Oral Daily   amoxicillin-clavulanate  1 tablet Oral BID   aspirin EC  325 mg Oral Daily   Chlorhexidine Gluconate Cloth  6 each Topical Daily   clopidogrel  75 mg Oral Daily   dapagliflozin propanediol  10 mg Oral Daily   feeding supplement  237 mL Oral BID BM   heparin  5,000 Units Subcutaneous Q8H   insulin aspart  0-9 Units Subcutaneous TID WC   insulin aspart  3 Units Subcutaneous TID WC   insulin glargine-yfgn  10 Units Subcutaneous Daily   lidocaine  1 patch Transdermal Q24H   lipase/protease/amylase  72,000 Units Oral TID WC   megestrol  400 mg Oral Daily   mirabegron ER  25 mg Oral Daily   mupirocin ointment  1 Application Nasal BID   nystatin  5 mL Oral QID   pantoprazole  40 mg Oral Daily   rosuvastatin  10 mg Oral Daily   sodium chloride flush  3 mL Intravenous Q12H   spironolactone  12.5 mg Oral Daily      Subjective:  Did well with swallow eval   Objective:   Vitals:   03/14/22 2353 03/15/22 0314 03/15/22 0800 03/15/22 0840  BP: (!) 112/52 (!) 102/57  (!)  105/50  Pulse: 62     Resp: _0 Temp: 98.3 F (36.8 C) 98.3 F (36.8 C)  98.2 F (36.8 C)  TempSrc: Oral Oral  Oral  SpO2: 96% 92% 94%   Weight:      Height:        Intake/Output Summary (Last 24 hours) at 03/15/2022 1314 Last data filed at 03/15/2022 3846 Gross per 24 hour  Intake 727.77 ml  Output 800 ml  Net -72.23 ml     Wt Readings from Last 3 Encounters:  03/13/22 57.2 kg  01/28/22 66.3 kg  01/14/22 67.2 kg   Physical Exam  General: Appearance:    elderly male in no  acute distress     Lungs:     Clear to auscultation bilaterally, respirations unlabored  Heart:    Normal heart rate. Normal rhythm.    MS:   All extremities are intact.   Neurologic:   Awake, alert      Data Reviewed:  I have personally reviewed following labs    CBC Lab Results  Component Value Date   WBC 4.8 03/15/2022   RBC 4.31 03/15/2022   HGB 10.0 (L) 03/15/2022   HCT 33.5 (L) 03/15/2022   MCV 77.7 (L) 03/15/2022   MCH 23.2 (L) 03/15/2022   PLT 338 03/15/2022   MCHC 29.9 (L) 03/15/2022   RDW 18.6 (H) 03/15/2022   LYMPHSABS 0.6 (L) 03/15/2022   MONOABS 0.2 03/15/2022   EOSABS 0.0 03/15/2022   BASOSABS 0.0 38/18/2993     Last metabolic panel Lab Results  Component Value Date   NA 139 03/15/2022   K 3.7 03/15/2022   CL 109 03/15/2022   CO2 19 (L) 03/15/2022   BUN 40 (H) 03/15/2022   CREATININE 1.72 (H) 03/15/2022   GLUCOSE 151 (H) 03/15/2022   GFRNONAA 40 (L) 03/15/2022   GFRAA >60 07/13/2018   CALCIUM 7.8 (L) 03/15/2022   PHOS 2.6 03/15/2022   PROT 5.6 (L) 03/06/2022   ALBUMIN 2.3 (L) 03/15/2022   LABGLOB 2.7 06/21/2018   AGRATIO 1.3 06/21/2018   BILITOT 0.5 03/06/2022   ALKPHOS 564 (H) 03/06/2022   AST 28 03/06/2022   ALT 26 03/06/2022   ANIONGAP 11 03/15/2022    CBG (last 3)  Recent Labs    03/14/22 1552 03/14/22 2117 03/15/22 0603  GLUCAP 147* 134* 128*      Coagulation Profile: No results for input(s): "INR", "PROTIME" in the last 168  hours.    Radiology Studies: I have personally reviewed the imaging studies  CT Chest High Resolution  Result Date: 03/14/2022 CLINICAL DATA:  77 year old male with history of abnormal chest x-ray. Evaluate for pneumonia. EXAM: CT CHEST WITHOUT CONTRAST TECHNIQUE: Multidetector CT imaging of the chest was performed following the standard protocol without intravenous contrast. High resolution imaging of the lungs, as well as inspiratory and expiratory imaging, was performed. RADIATION DOSE REDUCTION: This exam was performed according to the departmental dose-optimization program which includes automated exposure control, adjustment of the mA and/or kV according to patient size and/or use of iterative reconstruction technique. COMPARISON:  Chest CTA 12/04/2020. FINDINGS: Cardiovascular: Heart size is borderline enlarged. Small amount of pericardial fluid and/or thickening, unlikely to be of any hemodynamic significance at this time. No pericardial calcification. There is aortic atherosclerosis, as well as atherosclerosis of the great vessels of the mediastinum and the coronary arteries, including calcified atherosclerotic plaque in the left main, left anterior descending, left circumflex and right coronary arteries. Calcifications of the aortic valve. Mediastinum/Nodes: Again noted is bulky mediastinal and bilateral hilar lymphadenopathy, with extensive nodal calcifications, similar to the prior study from 2022. Esophagus is unremarkable in appearance. No axillary lymphadenopathy. Lungs/Pleura: Patchy areas of peribronchovascular predominant ground-glass attenuation, septal thickening, peribronchovascular airspace consolidation and regional architectural distortion noted throughout the lungs bilaterally (right greater than left), most compatible with severe multilobar bilateral bronchopneumonia. This limits assessment for underlying interstitial lung disease on today's examination. Widespread subpleural  nodularity noted throughout all aspects of the lungs bilaterally. Trace bilateral pleural effusions. Upper Abdomen: Coarse calcifications throughout the pancreas, indicative of chronic pancreatitis. Musculoskeletal: There are no aggressive appearing lytic or blastic lesions noted in the visualized portions of the skeleton.  IMPRESSION: 1. Study is limited by what appears to be severe multilobar bilateral (right greater than left) bronchopneumonia. If there is persistent clinical concern for interstitial lung disease, repeat high-resolution chest CT should be considered in 3 months after resolution of the patient's acute illness. 2. Chronic imaging stigmata suggestive of sarcoidosis redemonstrated, as above. 3. Small bilateral pleural effusions lying dependently. 4. Small amount of pericardial fluid and/or thickening, unlikely of hemodynamic significance. No pericardial calcification. 5. Aortic atherosclerosis, in addition to left main and three-vessel coronary artery disease. Assessment for potential risk factor modification, dietary therapy or pharmacologic therapy may be warranted, if clinically indicated. 6. There are calcifications of the aortic valve. Echocardiographic correlation for evaluation of potential valvular dysfunction may be warranted if clinically indicated. 7. Imaging findings compatible with severe chronic pancreatitis. Aortic Atherosclerosis (ICD10-I70.0). Electronically Signed   By: Vinnie Langton M.D.   On: 03/14/2022 08:53       Geradine Girt DO [facility] 03/15/2022, 1:14 PM  Available via Epic secure chat 7am-7pm After 7 pm, please refer to night coverage provider listed on amion.

## 2022-03-15 NOTE — Progress Notes (Signed)
Modified Barium Swallow Progress Note  Patient Details  Name: William Barron MRN: [id number] Date of Birth: September 09, 1945  Today's Date: 03/15/2022  Modified Barium Swallow completed.  Full report located under Chart Review in the Imaging Section.  Brief recommendations include the following:  Clinical Impression  Pt presented with a mild oropharyngeal dysphagia marked by prolonged oral preparation of solids, mild deficit in base of tongue retraction, leading to mild vallecular residue, and reliable laryngeal vestibule closure with no aspiration. There was only one incident of high/transient penetration with thin liquids (PAS score of 2, considered WFL). Most notable was that when the pt consumed the 13 mm barium pill, it lodged for several minutes in his proximal esophagus (near C6-7), creating discomfort, wincing, and repeated effort to dislodge without success. Ultimately, sequential sips of thin water released the pill and it traveled through esophagus and LES without incident. It is likely that larger pieces of solid foods are causing similar issues. Recommend resuming a PO diet - start with dysphagia 2 (chopped) for now; thin liquids; crush meds or break into smaller pieces and give with puree.  SLP will follow for education/safety.  Addendum: returned to room after study to discuss results and recs with William Barron and his son, who was at the bedside.  MBS imaging was reviewed with son and we discussed recs to begin a chopped diet with thin liquids. Anticipate advancement to regular solids as mentation and strength improve. Pt may benefit from GI f/u to determine if there is stricture in proximal esophagus.    Swallow Evaluation Recommendations   Recommended Consults: Consider GI evaluation   SLP Diet Recommendations: Dysphagia 2 (Fine chop) solids;Thin liquid   Liquid Administration via: Cup;Straw   Medication Administration: Crushed with puree   Supervision: Patient able to self  feed;Staff to assist with self feeding   Compensations: Slow rate;Small sips/bites       Oral Care Recommendations: Oral care BID       Sadik Piascik L. Tivis Ringer, MA CCC/SLP Clinical Specialist - Acute Care SLP Acute Rehabilitation Services Office number [phone]  William Barron 03/15/2022,1:33 PM

## 2022-03-15 NOTE — Progress Notes (Incomplete)
Initial Nutrition Assessment  DOCUMENTATION CODES:   Severe malnutrition in context of acute illness/injury  INTERVENTION:  - Continue Ensure Enlive po BID, each supplement provides 350 kcal and 20 grams of protein.  - Add Magic cup TID with meals, each supplement provides 290 kcal and 9 grams of protein  NUTRITION DIAGNOSIS:   Severe Malnutrition related to acute illness as evidenced by energy intake < or equal to 50% for > or equal to 5 days, percent weight loss.  GOAL:   Provide needs based on ASPEN/SCCM guidelines  MONITOR:   PO intake, Supplement acceptance  REASON FOR ASSESSMENT:   Consult Assessment of nutrition requirement/status, Calorie Count  ASSESSMENT:   77 y.o. male admits related to blurred vision. PMH includes: amyloidosis, multiple myeloma, prostate cancer, pancreatic cancer, s/p whipple, dyslipidemia, IDDM. Per H&P note, pt had PEG (he had placed s/p Whipple) removed about 4 months ago. Pt is currently receiving medical management for acute right PCA stroke.  Meds reviewed: sliding scale insulin, semglee (10 units), creon, cozaar, megace, aldactone. Labs reviewed: BUN/Creatinine elevated.   RD attempted to see pt 2x. Both times pt was working with other members of the multidisciplinary team. Per record, the pt has experienced a 14% wt loss over the past 2 months.   Calorie count was started yesterday evening.   RD Collected Kcal Count.   Will add Magic Cup supplements. Will finish calorie count tomorrow.   48 hour calorie count ordered.  Diet: Dys 2, thin liquids, 1200 mL fluid Supplements: Ensure BID.   Breakfast: 100 kcals  Lunch: 52 kcals, 20 gm protein  Dinner: 37 kcals, 1 gm protein  Total intake: 190 kcal (11% of minimum estimated needs)  21 protein (13% of minimum estimated needs)  Discussed with MD. Will plan for Cortrak tomorrow and discontinue Calorie Count.    NUTRITION - FOCUSED PHYSICAL EXAM:  Unable to assess, attempt at  f/u.  Diet Order:   Diet Order             DIET DYS 2 Room service appropriate? Yes with Assist; Fluid consistency: Thin; Fluid restriction: 1200 mL Fluid  Diet effective now                   EDUCATION NEEDS:   Not appropriate for education at this time  Skin:  Skin Assessment: Reviewed RN Assessment  Last BM:  12/30  Height:   Ht Readings from Last 1 Encounters:  03/11/22 _0  (1.651 m)    Weight:   Wt Readings from Last 1 Encounters:  03/13/22 57.2 kg    Ideal Body Weight:     BMI:  Body mass index is 20.98 kg/m.  Estimated Nutritional Needs:   Kcal:  1715-2000 kcals  Protein:  85-100 gm  Fluid:  >/= 1.7 L  Thalia Bloodgood, RD, LDN, CNSC.

## 2022-03-16 DIAGNOSIS — R627 Adult failure to thrive: Secondary | ICD-10-CM

## 2022-03-16 DIAGNOSIS — C9000 Multiple myeloma not having achieved remission: Secondary | ICD-10-CM | POA: Diagnosis not present

## 2022-03-16 DIAGNOSIS — E8589 Other amyloidosis: Secondary | ICD-10-CM | POA: Diagnosis not present

## 2022-03-16 DIAGNOSIS — I639 Cerebral infarction, unspecified: Secondary | ICD-10-CM | POA: Diagnosis not present

## 2022-03-16 DIAGNOSIS — I469 Cardiac arrest, cause unspecified: Secondary | ICD-10-CM | POA: Diagnosis not present

## 2022-03-16 LAB — CBC
HCT: 33.7 % — ABNORMAL LOW (ref 39.0–52.0)
Hemoglobin: 9.9 g/dL — ABNORMAL LOW (ref 13.0–17.0)
MCH: 23.1 pg — ABNORMAL LOW (ref 26.0–34.0)
MCHC: 29.4 g/dL — ABNORMAL LOW (ref 30.0–36.0)
MCV: 78.6 fL — ABNORMAL LOW (ref 80.0–100.0)
Platelets: 409 10*3/uL — ABNORMAL HIGH (ref 150–400)
RBC: 4.29 MIL/uL (ref 4.22–5.81)
RDW: 18.8 % — ABNORMAL HIGH (ref 11.5–15.5)
WBC: 5.1 10*3/uL (ref 4.0–10.5)
nRBC: 0 % (ref 0.0–0.2)

## 2022-03-16 LAB — GLUCOSE, CAPILLARY
Glucose-Capillary: 96 mg/dL (ref 70–99)
Glucose-Capillary: 160 mg/dL — ABNORMAL HIGH (ref 70–99)
Glucose-Capillary: 148 mg/dL — ABNORMAL HIGH (ref 70–99)
Glucose-Capillary: 186 mg/dL — ABNORMAL HIGH (ref 70–99)

## 2022-03-16 LAB — BASIC METABOLIC PANEL
Anion gap: 12 (ref 5–15)
BUN: 32 mg/dL — ABNORMAL HIGH (ref 8–23)
CO2: 19 mmol/L — ABNORMAL LOW (ref 22–32)
Calcium: 8.1 mg/dL — ABNORMAL LOW (ref 8.9–10.3)
Chloride: 112 mmol/L — ABNORMAL HIGH (ref 98–111)
Creatinine, Ser: 1.36 mg/dL — ABNORMAL HIGH (ref 0.61–1.24)
GFR, Estimated: 53 mL/min — ABNORMAL LOW (ref >60)
Glucose, Bld: 115 mg/dL — ABNORMAL HIGH (ref 70–99)
Potassium: 4.1 mmol/L (ref 3.5–5.1)
Sodium: 143 mmol/L (ref 135–145)

## 2022-03-16 MED ORDER — PANTOPRAZOLE SODIUM 40 MG PO TBEC
40.0000 mg | DELAYED_RELEASE_TABLET | Freq: Two times a day (BID) | ORAL | Status: DC
  Administered 2022-03-16 – 2022-03-21 (×10): 40 mg via ORAL
  Filled 2022-03-16 (×10): qty 1

## 2022-03-16 MED ORDER — DICLOFENAC SODIUM 1 % EX GEL
2.0000 g | Freq: Four times a day (QID) | CUTANEOUS | Status: DC
  Administered 2022-03-17 – 2022-03-22 (×20): 2 g via TOPICAL
  Filled 2022-03-16 (×2): qty 100

## 2022-03-16 MED ORDER — WHITE PETROLATUM EX OINT
TOPICAL_OINTMENT | CUTANEOUS | Status: DC | PRN
  Filled 2022-03-16 (×2): qty 28.35

## 2022-03-16 MED ORDER — LOSARTAN POTASSIUM 25 MG PO TABS
25.0000 mg | ORAL_TABLET | Freq: Every day | ORAL | Status: DC
  Administered 2022-03-16 – 2022-03-17 (×2): 25 mg via ORAL
  Filled 2022-03-16 (×3): qty 1

## 2022-03-16 NOTE — Progress Notes (Signed)
Inpatient Rehab Admissions Coordinator:    I spoke with Pt.'s wife [name] regarding CIR vs SNF. PT is now recommending SNF due to concerns that Pt. Cannot tolerate intensity of CIR. Additionally, Pt.'s wife can provide supervision only (and was reluctant to provide that even) and I do not think Pt. Will reach supervision level by the time he is discharged from CIR. I think he needs SNF. Wife is in agreement but thinks son may not agree. I placed call to Pt.'s son to discuss with him as well.   Clemens Catholic, [place], [place] Admissions Coordinator  [phone] ([place]) [phone] (office)

## 2022-03-16 NOTE — Evaluation (Signed)
Occupational Therapy Evaluation Patient Details Name: William Barron MRN: [id number] DOB: January 23, 1946 Today's Date: 03/16/2022   History of Present Illness 77 year old male admitted 12/22 with right-sided headache and blurred vision. +Acute right PCA stroke. During hospitalization pt found to be Flu + on 03/07/22. Pt also had code blue called on 03/11/22. Pt received compressions before regaining ROSC s/p VT arrest. Pt underwent cardiac cath on 03/11/22. PMHX:  amyloidosis, multiple myeloma, history of prostate cancer, history of pancreatic cancer status post Whipple, dyslipidemia, diabetes.   Clinical Impression   William Barron is making solid progress towards his acute OT goals, pt seen with PT to safety progress activity tolerance. Overall he tolerated 3x sit<>stands with ~74f ambulation each time given RW and mod A +2. Pt reported improvement of DV with R eye occluded, however at the end of the session pt endorsed diplopia despite visual occlusion. Pt noted to continue to have cognitive impairments with deficits noted in attention, problem solving and carryover of education. OT to continue to follow acutely. Continue to recommend AIR at d/c at pt demonstrates medical necessity and good rehab potential.      Recommendations for follow up therapy are one component of a multi-disciplinary discharge planning process, led by the attending physician.  Recommendations may be updated based on patient status, additional functional criteria and insurance authorization.   Follow Up Recommendations  Acute inpatient rehab (3hours/day)     Assistance Recommended at Discharge Frequent or constant Supervision/Assistance  Patient can return home with the following A lot of help with walking and/or transfers;A little help with bathing/dressing/bathroom;Assistance with cooking/housework;Direct supervision/assist for financial management;Direct supervision/assist for medications management;Assist for transportation;Help  with stairs or ramp for entrance;Assistance with feeding       Equipment Recommendations  Other (comment)    Recommendations for Other Services Rehab consult     Precautions / Restrictions Precautions Precautions: Fall Precaution Comments: vision impaired Required Braces or Orthoses: Other Brace Other Brace: RLE AFO and shoes Restrictions Weight Bearing Restrictions: No      Mobility Bed Mobility Overal bed mobility: Needs Assistance Bed Mobility: Supine to Sit     Supine to sit: Min assist     General bed mobility comments: increased time and cues needed for problem solving / sequencing activity. self-distracted on "how do I breathe" during bed mobility    Transfers Overall transfer level: Needs assistance Equipment used: Rolling walker (2 wheels) Transfers: Sit to/from Stand Sit to Stand: Mod assist, +2 physical assistance, +2 safety/equipment           General transfer comment: 3x      Balance Overall balance assessment: Needs assistance Sitting-balance support: No upper extremity supported, Feet supported Sitting balance-Leahy Scale: Fair Sitting balance - Comments: supervision static sitting   Standing balance support: Bilateral upper extremity supported, During functional activity Standing balance-Leahy Scale: Good                             ADL either performed or assessed with clinical judgement   ADL Overall ADL's : Needs assistance/impaired Eating/Feeding: Set up;Sitting Eating/Feeding Details (indicate cue type and reason): able to manage a cup with straw despite tremor                     Toilet Transfer: Moderate assistance;+2 for physical assistance;+2 for safety/equipment;Ambulation;BSC/3in1;Rolling walker (2 wheels) Toilet Transfer Details (indicate cue type and reason): simulated         Functional  mobility during ADLs: Moderate assistance;+2 for physical assistance;+2 for safety/equipment General ADL Comments:  sit<>stand 3x with mod A +2. tolerated 94f ambulation with RW 3x. limited by activity tolerance and weakness     Vision   Vision Assessment?: Yes Eye Alignment: Impaired (comment) Ocular Range of Motion: Other (comment) Tracking/Visual Pursuits: Right eye does not track laterally Visual Fields: Impaired-to be further tested in functional context Diplopia Assessment: Disappears with one eye closed;Objects split side to side Additional Comments: dysconjugate gaze without glasses. reports DV throughout, initally stating DV improved with R lens taped, then reported DV. WIll bebefit from further assessment     Perception     Praxis      Pertinent Vitals/Pain Pain Assessment Pain Assessment: Faces Faces Pain Scale: Hurts a little bit Pain Location: generalized with movement Pain Descriptors / Indicators: Headache Pain Intervention(s): Limited activity within patient's tolerance, Monitored during session     Hand Dominance     Extremity/Trunk Assessment Upper Extremity Assessment Upper Extremity Assessment: RUE deficits/detail;LUE deficits/detail RUE Deficits / Details: intentional tremor at baseline. generally weak, overall WFL for tasks assessed LUE Deficits / Details: globally weak, increased time and effort needed for coordination and AROM. intentional tremor at baseline LUE Coordination: decreased fine motor;decreased gross motor   Lower Extremity Assessment Lower Extremity Assessment: Defer to PT evaluation       Communication     Cognition Arousal/Alertness: Awake/alert Behavior During Therapy: Flat affect Overall Cognitive Status: Impaired/Different from baseline Area of Impairment: Attention, Memory, Following commands, Awareness, Safety/judgement, Problem solving                   Current Attention Level: Selective Memory: Decreased recall of precautions, Decreased short-term memory Following Commands: Follows one step commands  consistently Safety/Judgement: Decreased awareness of safety, Decreased awareness of deficits Awareness: Emergent Problem Solving: Slow processing, Decreased initiation, Requires verbal cues, Difficulty sequencing General Comments: oriented and alert. Requires increased time and cues for processing and attention. Pt with some anxiety due to his breathing     General Comments  VSS on 3-4L [place]. poor pleth during mobility, however SpO2 in 90s once able to get a good wave-form    Exercises Other Exercises Other Exercises: R glasses lens occluded for mobility    OT Goals(Current goals can be found in the care plan section) Acute Rehab OT Goals Patient Stated Goal: to get stronger OT Goal Formulation: With patient Time For Goal Achievement: 03/20/22 Potential to Achieve Goals: Good ADL Goals Pt Will Perform Grooming: standing;with supervision Pt Will Perform Lower Body Dressing: sit to/from stand;with modified independence Pt Will Transfer to Toilet: with supervision;ambulating;regular height toilet Pt/caregiver will Perform Home Exercise Program: Right Upper extremity;Left upper extremity;Increased strength;With written HEP provided;With Supervision Additional ADL Goal #1: Pt will locate all grooming items needed during ADL. Additional ADL Goal #2: Pt will verbalize HEP for double vision.  OT Frequency: Min 2X/week    Co-evaluation PT/OT/SLP Co-Evaluation/Treatment: Yes Reason for Co-Treatment: Complexity of the patient's impairments (multi-system involvement);For patient/therapist safety;To address functional/ADL transfers   OT goals addressed during session: ADL's and self-care      AM-PAC OT "6 Clicks" Daily Activity     Outcome Measure Help from another person eating meals?: A Little Help from another person taking care of personal grooming?: A Little Help from another person toileting, which includes using toliet, bedpan, or urinal?: A Lot Help from another person bathing  (including washing, rinsing, drying)?: A Lot Help from another person to put on and  taking off regular upper body clothing?: A Little Help from another person to put on and taking off regular lower body clothing?: A Lot 6 Click Score: 15   End of Session Equipment Utilized During Treatment: Gait belt;Rolling walker (2 wheels) Nurse Communication: Mobility status  Activity Tolerance: Patient tolerated treatment well Patient left: in chair;with call bell/phone within reach  OT Visit Diagnosis: Unsteadiness on feet (R26.81);Muscle weakness (generalized) (M62.81);Other abnormalities of gait and mobility (R26.89);History of falling (Z91.81);Other symptoms and signs involving cognitive function;Low vision, both eyes (H54.2);Dizziness and giddiness (R42)                Time: 9357-0177 OT Time Calculation (min): 35 min Charges:  OT General Charges $OT Visit: 1 Visit OT Treatments $Therapeutic Activity: 8-22 mins   Elliot Cousin 03/16/2022, 3:03 PM

## 2022-03-16 NOTE — Progress Notes (Signed)
[facility]                                                                              William Barron, is a 77 y.o. male, DOB - Aug 23, 1945, ZOX:[id number] Admit date - 03/05/2022    Outpatient Primary MD for the patient is [name], MD  LOS - 10  days  Chief Complaint  Patient presents with   Headache       Brief summary   Patient is a 77 year old male with history of pancreatic CA status post Whipple, prostate CA, multiple myeloma, amyloidosis, dyslipidemia, DM, presented to ED with right-sided headache and blurred vision.  Last known well was in the evening of 03/04/2022.  He woke up on the morning of admission on 12/22 with difficulty seeing out of his right eye.  He went to see an ophthalmologist and was referred to ER CT head showed acute/subacute right occipital CVA  03/11/2022: Patient was found unresponsive and pulseless around 4:45 AM.  Patient was found to have gone into VT/VF cardiac arrest in setting of low EF 30 to 35%.  Patient has been successfully resuscitated.  Patient underwent left and right heart cardiac catheterization afterwards.   1/1- did well with MBS- diet order placed   Assessment & Plan    Acute right PCA stroke (HCC) right CN VI palsy, chronic -Presented with visual issues, having difficulty seeing half of the clock.  CT head showed right PCA territory stroke. -MRI brain showed acute to early subacute right PCA territory infarct.  Associated mild petechial blood products without frank hemorrhagic transformation or significant residual mass effect.  Underlying moderate chronic microvascular ischemic disease -CTA head and neck showed acute or early subacute infarct in the right occipital lobe, suspected high-grade stenosis versus occlusion of the distal right PCA. -2D echo: EF of 35 to 40% with global hypokinesis, mild AS, mild pulmonary regurg, mild MR, mild AR (EF 50 to 55% on echo in 11/2021) -Per neuro, continue aspirin 325  mg and Plavix 75 mg DAPT for 3 months given intracranial stenosis/occlusion then aspirin 81 mg alone daily indefinitely  -Lipid panel showed LDL 66, at goal, no need of statin per neurology -Hemoglobin A1c 6.5 -Loop recorder per neuro, timing per cardiology?   New onset systolic CHF, EF 35 to 81%  -2D echo showed EF of 35 to 40% with global hypokinesis (was 50 to 55% on echo in 11/2021) -Continue Coreg, losartan 28m daily, Farxiga 10 mg daily  -Lasix as needed -cards consult:  - With elevated creatinine, will hold off on Entresto.  - Does not need a diuretic.  - Continue dapagliflozin and spironolactone.  - Would hold off on inotropic support for the time being, think main problem at this time is PNA/lung infiltrates and failure to thrive/malnutrition.    VT arrest:  - amiodarone.  Suspect triggered by lateral wall scar (prior MI versus myocarditis).  EP has seen, recommend amiodarone and Lifevest at discharge to follow post-discharge course and decide on ICD down the road depending on his trajectory.  - Continue amiodarone po.   Influenza A positive -COVID-19 negative, positive for influenza A -treat  with tamiflu  Bibasilar infiltrates: -Procalcitonin came back normal. -Speech evaluation. -abx -pulmonary toiletry -wean O2 as able  Oral thrush/throat pain: -Throat pain has improved. -Complete course of oral nystatin. -may need GI consult for ? stricture  Hypokalemia -Replete  GERD -Continue PPI  Diabetes mellitus type 2, IDDM -SSI  History of pancreatic CA status post Whipple procedure -Continue Creon 72,000 units 3 times daily with meals -Follows with [name]  History of AL amyloidosis ([place]),  Multiple myeloma ([place]) -Outpatient follow-up with his oncologist, [name] at [facility].  History of prostate CA -Continue outpatient follow-up with urology, Dr. Jeffie Pollock, completed XRT on 06/02/2020    Lesion of parotid gland -MRI brain incidentally also  showed 1.8 cm T2 hypointense lesion within the posterior aspect of the right parotid gland, indeterminate.  Recommended nonemergent outpatient ENT evaluation -CTA showed 1.6 cm partially calcified lesion in the posterior aspect of right parotid gland with multiple surrounding smaller lesion, could potentially represent primary parotid neoplasm benign or malignant or enlarged lymph nodes, recommended outpatient ENT evaluation  Malnutrition -family to give consideration to feeding tube- cortrack -calorie count currently    Code Status: Full code DVT Prophylaxis:  Place and maintain sequential compression device Start: 03/15/22 1355 heparin injection 5,000 Units Start: 03/11/22 2200 SCD's Start: 03/05/22 2235  Level of Care: Level of care: Progressive Family Communication: Discussed with patient Disposition Plan:      Remains inpatient appropriate: PT recommended inpatient rehab.  TOC, CIR assisting and working with wife.    Consultants:   Neurology Cardiology Electrophysiology. Pulmonary Palliative care   Medications  amiodarone  400 mg Oral BID   Followed by   Derrill Memo ON 03/26/2022] amiodarone  400 mg Oral Daily   amoxicillin-clavulanate  1 tablet Oral BID   aspirin EC  325 mg Oral Daily   Chlorhexidine Gluconate Cloth  6 each Topical Daily   clopidogrel  75 mg Oral Daily   dapagliflozin propanediol  10 mg Oral Daily   diclofenac Sodium  2 g Topical QID   feeding supplement  237 mL Oral BID BM   heparin  5,000 Units Subcutaneous Q8H   insulin aspart  0-9 Units Subcutaneous TID WC   insulin aspart  3 Units Subcutaneous TID WC   insulin glargine-yfgn  10 Units Subcutaneous Daily   lidocaine  1 patch Transdermal Q24H   lipase/protease/amylase  72,000 Units Oral TID WC   losartan  25 mg Oral Daily   megestrol  400 mg Oral Daily   mirabegron ER  25 mg Oral Daily   mupirocin ointment  1 Application Nasal BID   nystatin  5 mL Oral QID   pantoprazole  40 mg Oral Daily    rosuvastatin  10 mg Oral Daily   sodium chloride flush  3 mL Intravenous Q12H   spironolactone  12.5 mg Oral Daily      Subjective:  C/o rib pain   Objective:   Vitals:   03/15/22 2340 03/16/22 0419 03/16/22 0625 03/16/22 0909  BP: (!) 100/52 (!) 104/56  (!) 102/58  Pulse:    76  Resp: 18 20  (!) 26  Temp: 97.7 F (36.5 C) 98 F (36.7 C)  98.2 F (36.8 C)  TempSrc: Oral Oral  Oral  SpO2: 95% 93% 96% 94%  Weight:      Height:        Intake/Output Summary (Last 24 hours) at 03/16/2022 1231 Last data filed at 03/16/2022 9030 Gross per 24 hour  Intake 287.86 ml  Output 650 ml  Net -362.14 ml     Wt Readings from Last 3 Encounters:  03/13/22 57.2 kg  01/28/22 66.3 kg  01/14/22 67.2 kg   Physical Exam   General: Appearance:    elderly male in no acute distress- in chair     Lungs:     Clear to auscultation bilaterally, respirations unlabored  Heart:    Normal heart rate.   MS:   All extremities are intact.   Neurologic:   Awake, alert        Data Reviewed:  I have personally reviewed following labs    CBC Lab Results  Component Value Date   WBC 5.1 03/16/2022   RBC 4.29 03/16/2022   HGB 9.9 (L) 03/16/2022   HCT 33.7 (L) 03/16/2022   MCV 78.6 (L) 03/16/2022   MCH 23.1 (L) 03/16/2022   PLT 409 (H) 03/16/2022   MCHC 29.4 (L) 03/16/2022   RDW 18.8 (H) 03/16/2022   LYMPHSABS 0.6 (L) 03/15/2022   MONOABS 0.2 03/15/2022   EOSABS 0.0 03/15/2022   BASOSABS 0.0 64/15/8309     Last metabolic panel Lab Results  Component Value Date   NA 143 03/16/2022   K 4.1 03/16/2022   CL 112 (H) 03/16/2022   CO2 19 (L) 03/16/2022   BUN 32 (H) 03/16/2022   CREATININE 1.36 (H) 03/16/2022   GLUCOSE 115 (H) 03/16/2022   GFRNONAA 53 (L) 03/16/2022   GFRAA >60 07/13/2018   CALCIUM 8.1 (L) 03/16/2022   PHOS 2.6 03/15/2022   PROT 5.6 (L) 03/06/2022   ALBUMIN 2.3 (L) 03/15/2022   LABGLOB 2.7 06/21/2018   AGRATIO 1.3 06/21/2018   BILITOT 0.5 03/06/2022   ALKPHOS  564 (H) 03/06/2022   AST 28 03/06/2022   ALT 26 03/06/2022   ANIONGAP 12 03/16/2022    CBG (last 3)  Recent Labs    03/15/22 1647 03/15/22 2125 03/16/22 0641  GLUCAP 113* 125* 96      Coagulation Profile: No results for input(s): "INR", "PROTIME" in the last 168 hours.    Radiology Studies: I have personally reviewed the imaging studies  DG SWALLOW [facility] SPEECH PATH  Result Date: 03/15/2022 Table formatting from the original result was not included. Objective Swallowing Evaluation: Type of Study: MBS-Modified Barium Swallow Study  Patient Details Name: William Barron MRN: [id number] Date of Birth: 1945/11/23 Today's Date: 03/15/2022 Time: SLP Start Time (ACUTE ONLY): 34 -SLP Stop Time (ACUTE ONLY): 1031 SLP Time Calculation (min) (ACUTE ONLY): 28 min Past Medical History: Past Medical History: Diagnosis Date  Amyloidosis ([place])   Diabetes mellitus without complication ([place])   Feeding tube dysfunction, initial encounter 08/12/2021  GERD (gastroesophageal reflux disease)   Hearing loss   Has hearing aids  History of kidney stones   Hyperlipidemia   Jejunostomy tube present ([place])   Multiple myeloma ([place])   and amylodosis   Nephrolithiasis   Occasional tremors   Pancreatic insufficiency   Pneumonia   Prostate cancer ([place])   Prostatitis   acute and chronic  Skin cancer  Past Surgical History: Past Surgical History: Procedure Laterality Date  APPENDECTOMY    CARDIAC CATHETERIZATION N/A 06/13/2015  Procedure: Left Heart Cath and Coronary Angiography;  Surgeon: Jettie Booze, MD;  Location: [facility];  Service: Cardiovascular;  Laterality: N/A;  COLONOSCOPY  04/07/2000  ELBOW SURGERY  03/15/2001  right  ESOPHAGOGASTRODUODENOSCOPY (EGD) WITH PROPOFOL N/A 09/07/2021  Procedure: ESOPHAGOGASTRODUODENOSCOPY (EGD) WITH PROPOFOL;  Surgeon: Irene Shipper,  MD;  Location: [facility];  Service: Gastroenterology;  Laterality: N/A;  EXTRACORPOREAL SHOCK WAVE LITHOTRIPSY Left 07/17/2018  Procedure:  EXTRACORPOREAL SHOCK WAVE LITHOTRIPSY (ESWL);  Surgeon: [name], MD;  Location: [facility];  Service: Urology;  Laterality: Left;  EXTRACORPOREAL SHOCK WAVE LITHOTRIPSY Right 12/08/2020  Procedure: RIGHT EXTRACORPOREAL SHOCK WAVE LITHOTRIPSY (ESWL);  Surgeon: Raynelle Bring, MD;  Location: [facility];  Service: Urology;  Laterality: Right;  FOOT ARTHRODESIS Right 04/15/2021  Procedure: RIGHT SUBTALAR AND TALONAVICULAR FUSION;  Surgeon: Newt Minion, MD;  Location: [facility];  Service: Orthopedics;  Laterality: Right;  HERNIA REPAIR    IR MECH REMOV OBSTRUC MAT ANY COLON TUBE W/FLUORO  08/27/2021  IR REPLC GASTRO/COLONIC TUBE PERCUT W/FLUORO  09/08/2021  RIGHT/LEFT HEART CATH AND CORONARY ANGIOGRAPHY N/A 03/11/2022  Procedure: RIGHT/LEFT HEART CATH AND CORONARY ANGIOGRAPHY;  Surgeon: Jettie Booze, MD;  Location: [facility];  Service: Cardiovascular;  Laterality: N/A;  ROTATOR CUFF REPAIR    WHIPPLE PROCEDURE  07/13/2021 HPI: 77 year old male with history of nonobstructive CAD, mild to moderate aortic stenosis, moderate pulmonary regurgitation, pancreatic CA s/p whipple, diabetes, 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 On 12/28 (early morning), pt"Went into ventricular tachycardia versus polymorphic and received 1 minute of chest compressions.  He was pulseless.  He did not require defibrillation.  ROSC was achieved within 1 minute.  -Unclear etiology.  Lactic acid is normal.  Electrolytes are normal.  Kidney function is stable.  He has been admitted with stroke but is recovering.  Also complicated by influenza A.  He also has a new onset cardiomyopathy so clearly the combination of influenza and congestive heart failure could have resulted in ventricular arrhythmias"per cardiology note.  CXR indicated RLL PNA which could have been a result of aspiration.  BSE generated to assess swallow function 12/29.  Reconsulted 12/30,  Per  MD note: "Bibasal infiltrate noted, query secondary to aspiration.  Repeat speech evaluation."  Subjective: alert and cooperative  Recommendations for follow up therapy are one component of a multi-disciplinary discharge planning process, led by the attending physician.  Recommendations may be updated based on patient status, additional functional criteria and insurance authorization. Assessment / Plan / Recommendation   03/15/2022  12:00 PM Clinical Impressions Clinical Impression Pt presented with a mild oropharyngeal dysphagia marked by prolonged oral preparation of solids, mild deficit in base of tongue retraction, leading to mild vallecular residue, and reliable laryngeal vestibule closure with no aspiration. There was only one incident of high/transient penetration with thin liquids (PAS score of 2, considered WFL). Most notable was that when the pt consumed the 13 mm barium pill, it lodged for several minutes in his proximal esophagus (near C6-7), creating discomfort, wincing, and repeated effort to dislodge without success. Ultimately, sequential sips of thin water released the pill and it traveled through esophagus and LES without incident. It is likely that larger pieces of solid foods are causing similar issues. Recommend resuming a PO diet - start with dysphagia 2 (chopped) for now; thin liquids; crush meds or break into smaller pieces and give with puree.  SLP will follow for education/safety. Addendum: returned to room after study to discuss results and recs with William Barron and his son, who was at the bedside.  MBS imaging was reviewed with son and we discussed recs to begin a chopped diet with thin liquids. Anticipate advancement to regular solids as mentation and strength improve. Pt may benefit from GI  f/u to determine if there is stricture in proximal esophagus.   SLP Visit Diagnosis Dysphagia, oropharyngeal phase (R13.12)     03/15/2022  12:00 PM Treatment Recommendations Treatment Recommendations  Therapy as outlined in treatment plan below     03/14/2022   9:20 AM Prognosis Prognosis for Safe Diet Advancement --   03/15/2022  12:00 PM Diet Recommendations SLP Diet Recommendations Dysphagia 2 (Fine chop) solids;Thin liquid Liquid Administration via Cup;Straw Medication Administration Crushed with puree Compensations Slow rate;Small sips/bites     03/15/2022  12:00 PM Other Recommendations Recommended Consults Consider GI evaluation Oral Care Recommendations Oral care BID Follow Up Recommendations No SLP follow up   03/15/2022  12:00 PM Frequency and Duration  Speech Therapy Frequency (ACUTE ONLY) min 1 x/week Treatment Duration 1 week     03/15/2022  12:00 PM Oral Phase Oral Phase Impaired Oral - Puree Delayed oral transit;Reduced posterior propulsion Oral - Mech Soft Reduced posterior propulsion;Delayed oral transit    03/15/2022  12:00 PM Pharyngeal Phase Pharyngeal Phase Impaired Pharyngeal- Nectar Cup Reduced tongue base retraction;Pharyngeal residue - valleculae Pharyngeal- Puree Reduced tongue base retraction;Pharyngeal residue - valleculae Pharyngeal- Mechanical Soft Reduced tongue base retraction;Pharyngeal residue - valleculae    03/15/2022  12:00 PM Cervical Esophageal Phase  Cervical Esophageal Phase -- William Barron 03/15/2022, 1:38 PM  Amanda L. Tivis Ringer, MA CCC/SLP Clinical Specialist - Acute Care SLP Acute Rehabilitation Services Office number [phone]                        Geradine Girt DO [facility] 03/16/2022, 12:31 PM  Available via Epic secure chat 7am-7pm After 7 pm, please refer to night coverage provider listed on amion.

## 2022-03-16 NOTE — Progress Notes (Addendum)
Palliative Medicine Progress Note   Patient Name: William Barron       Date: 03/16/2022 DOB: June 04, 1945  Age: 77 y.o. MRN#: [id number] Attending Physician: William Girt, DO Primary Care Physician: William Hatchet, MD Admit Date: 03/05/2022    HPI/Patient Profile: 77 y.o. male  with past medical history of multiple myeloma and amyloidosis (followed by oncology at [facility]), pancreatic cyst status post Whipple procedure May 2023, and diabetes mellitus type 2 who presented to [facility] ED on 03/05/2022 with right-sided vision changes and headache. CT head showed acute/subacute right occipital CVA.    On 12/24, he was found to be positive for influenza A.  On 12/28, he was found unresponsive and pulseless. It was determined he had gone into VT/VF cardiac arrest.  He was successfully resuscitated and underwent left and right cardiac catheterization. He was found to have new-onset systolic heart failure with reduced EF 35-40%.  On 12/30, CT chest showed severe multilobar bilateral (right greater than left) pneumonia.   Palliative Medicine has been consulted for goals of care.  Subjective: Chart reviewed. MBSS done yesterday and patient cleared for dysphagia 2 diet.   Bedside visit. Patient is OOB to the recliner. He doesn't have any acute complaints other than frequent cough. Encouraged cough, deep breathing, and use of incentive spirometer. Oxygen down to 3L. Calorie count is in progress.   Son William Barron is at bedside. He is hopeful that his dad can go to acute inpatient rehab (CIR) after discharge.   Plan of care discussed with Dr. Eliseo Barron.   Objective:  Physical Exam Vitals reviewed.  Constitutional:      General: He is not in acute distress.    Appearance: He is ill-appearing.  Pulmonary:      Effort: Pulmonary effort is normal.     Comments: Productive cough Neurological:     Mental Status: He is alert.     Motor: Weakness present.            Palliative Medicine Assessment & Plan   Assessment: Principal Problem:   Occipital stroke ([place]) Active Problems:   Amyloidosis ([place])   Multiple myeloma ([place])   Dyslipidemia   H/O Whipple procedure   Insulin-requiring or dependent type II diabetes mellitus ([place])   Lesion of parotid gland   Acute systolic CHF (congestive heart failure) ([place])  Influenza A with pneumonia   Cardiac arrest [facility])    Recommendations/Plan: Full code for now - plan for additional discussion Continue full scope interventions Family understands the seriousness of patient's current medical situation, but are hopeful for improvement Goal is for acute inpatient rehab (CIR) PMT will continue to follow   Prognosis:  Unable to determine  Discharge Planning: To Be Determined   Thank you for allowing the Palliative Medicine Team to assist in the care of this patient.   MDM - moderate   William Bullion, NP   Please contact Palliative Medicine Team phone at [phone] for questions and concerns.  For individual providers, please see AMION.

## 2022-03-16 NOTE — Progress Notes (Addendum)
Patient ID: William Barron, male   DOB: 11-20-1945, 77 y.o.   MRN: [id number]   Advanced Heart Failure Rounding Note  PCP-Cardiologist: Minus Breeding, MD   Subjective:    Attempting to wean down O2, on 4L HFNC. O2 sats low 90s when I entered room but [place] no longer in nostrils.  No dyspnea at rest.   Able to ambulate a few steps with his walker but overall very weak.   CT chest: Extensive bilateral multilobar PNA, R>L.  Bulky mediastinal/hilar LAN, seen on prior studies.   Objective:   Weight Range: 57.2 kg Body mass index is 20.98 kg/m.   Vital Signs:   Temp:  [97.5 F (36.4 C)-98.2 F (36.8 C)] 98 F (36.7 C) (01/02 0419) Pulse Rate:  [65-73] 65 (01/01 1940) Resp:  [15-23] 20 (01/02 0419) BP: (100-120)/(50-64) 104/56 (01/02 0419) SpO2:  [93 %-100 %] 96 % (01/02 0625) Last BM Date : 03/13/22  Weight change: Filed Weights   03/08/22 0500 03/11/22 0906 03/13/22 0410  Weight: 64.7 kg 58.5 kg 57.2 kg    Intake/Output:   Intake/Output Summary (Last 24 hours) at 03/16/2022 6568 Last data filed at 03/16/2022 1275 Gross per 24 hour  Intake 287.86 ml  Output 650 ml  Net -362.14 ml      Physical Exam    General: Frail, chronically ill appearing elderly male HEENT: HOH Neck: supple. no JVD. Carotids 2+ bilat; no bruits. No lymphadenopathy or thryomegaly appreciated. Cor: PMI nondisplaced. Regular rate & rhythm. No rubs, gallops or murmurs. Lungs: clear Abdomen: soft, nontender, nondistended.  Extremities: no cyanosis, clubbing, rash, edema Neuro: alert & orientedx3. Affect pleasant    Telemetry   NSR 60s  Labs    CBC Recent Labs    03/15/22 0059 03/16/22 0130  WBC 4.8 5.1  NEUTROABS 4.0  --   HGB 10.0* 9.9*  HCT 33.5* 33.7*  MCV 77.7* 78.6*  PLT 338 170*   Basic Metabolic Panel Recent Labs    03/15/22 0059 03/15/22 0100 03/16/22 0130  NA  --  139 143  K  --  3.7 4.1  CL  --  109 112*  CO2  --  19* 19*  GLUCOSE  --  151* 115*  BUN  --   40* 32*  CREATININE  --  1.72* 1.36*  CALCIUM  --  7.8* 8.1*  MG 2.3  --   --   PHOS  --  2.6  --    Liver Function Tests Recent Labs    03/15/22 0100  ALBUMIN 2.3*   No results for input(s): "LIPASE", "AMYLASE" in the last 72 hours. Cardiac Enzymes No results for input(s): "CKTOTAL", "CKMB", "CKMBINDEX", "TROPONINI" in the last 72 hours.  BNP: BNP (last 3 results) Recent Labs    03/07/22 1237  BNP 1,326.2*    ProBNP (last 3 results) No results for input(s): "PROBNP" in the last 8760 hours.   D-Dimer No results for input(s): "DDIMER" in the last 72 hours. Hemoglobin A1C No results for input(s): "HGBA1C" in the last 72 hours. Fasting Lipid Panel No results for input(s): "CHOL", "HDL", "LDLCALC", "TRIG", "CHOLHDL", "LDLDIRECT" in the last 72 hours. Thyroid Function Tests No results for input(s): "TSH", "T4TOTAL", "T3FREE", "THYROIDAB" in the last 72 hours.  Invalid input(s): "FREET3"  Other results:   Imaging    DG SWALLOW FUNC SPEECH PATH  Result Date: 03/15/2022 Table formatting from the original result was not included. Objective Swallowing Evaluation: Type of Study: MBS-Modified Barium Swallow Study  Patient  Details Name: William Barron MRN: [id number] Date of Birth: 1946/01/11 Today's Date: 03/15/2022 Time: SLP Start Time (ACUTE ONLY): 83 -SLP Stop Time (ACUTE ONLY): 1308 SLP Time Calculation (min) (ACUTE ONLY): 28 min Past Medical History: Past Medical History: Diagnosis Date  Amyloidosis ([place])   Diabetes mellitus without complication ([place])   Feeding tube dysfunction, initial encounter 08/12/2021  GERD (gastroesophageal reflux disease)   Hearing loss   Has hearing aids  History of kidney stones   Hyperlipidemia   Jejunostomy tube present ([place])   Multiple myeloma ([place])   and amylodosis   Nephrolithiasis   Occasional tremors   Pancreatic insufficiency   Pneumonia   Prostate cancer ([place])   Prostatitis   acute and chronic  Skin cancer  Past Surgical History: Past  Surgical History: Procedure Laterality Date  APPENDECTOMY    CARDIAC CATHETERIZATION N/A 06/13/2015  Procedure: Left Heart Cath and Coronary Angiography;  Surgeon: Jettie Booze, MD;  Location: [place] [facility];  Service: Cardiovascular;  Laterality: N/A;  COLONOSCOPY  04/07/2000  ELBOW SURGERY  03/15/2001  right  ESOPHAGOGASTRODUODENOSCOPY (EGD) WITH PROPOFOL N/A 09/07/2021  Procedure: ESOPHAGOGASTRODUODENOSCOPY (EGD) WITH PROPOFOL;  Surgeon: Irene Shipper, MD;  Location: [place];  Service: Gastroenterology;  Laterality: N/A;  EXTRACORPOREAL SHOCK WAVE LITHOTRIPSY Left 07/17/2018  Procedure: EXTRACORPOREAL SHOCK WAVE LITHOTRIPSY (ESWL);  Surgeon: [name], MD;  Location: [facility];  Service: Urology;  Laterality: Left;  EXTRACORPOREAL SHOCK WAVE LITHOTRIPSY Right 12/08/2020  Procedure: RIGHT EXTRACORPOREAL SHOCK WAVE LITHOTRIPSY (ESWL);  Surgeon: Raynelle Bring, MD;  Location: [facility];  Service: Urology;  Laterality: Right;  FOOT ARTHRODESIS Right 04/15/2021  Procedure: RIGHT SUBTALAR AND TALONAVICULAR FUSION;  Surgeon: Newt Minion, MD;  Location: [place];  Service: Orthopedics;  Laterality: Right;  HERNIA REPAIR    IR MECH REMOV OBSTRUC MAT ANY COLON TUBE W/FLUORO  08/27/2021  IR REPLC GASTRO/COLONIC TUBE PERCUT W/FLUORO  09/08/2021  RIGHT/LEFT HEART CATH AND CORONARY ANGIOGRAPHY N/A 03/11/2022  Procedure: RIGHT/LEFT HEART CATH AND CORONARY ANGIOGRAPHY;  Surgeon: Jettie Booze, MD;  Location: [place] [facility];  Service: Cardiovascular;  Laterality: N/A;  ROTATOR CUFF REPAIR    WHIPPLE PROCEDURE  07/13/2021 HPI: 77 year old male with history of nonobstructive CAD, mild to moderate aortic stenosis, moderate pulmonary regurgitation, pancreatic CA s/p whipple, diabetes, 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 On 12/28 (early morning), pt"Went into ventricular tachycardia versus polymorphic and received 1 minute of  chest compressions.  He was pulseless.  He did not require defibrillation.  ROSC was achieved within 1 minute.  -Unclear etiology.  Lactic acid is normal.  Electrolytes are normal.  Kidney function is stable.  He has been admitted with stroke but is recovering.  Also complicated by influenza A.  He also has a new onset cardiomyopathy so clearly the combination of influenza and congestive heart failure could have resulted in ventricular arrhythmias"per cardiology note.  CXR indicated RLL PNA which could have been a result of aspiration.  BSE generated to assess swallow function 12/29.  Reconsulted 12/30,  Per MD note: "Bibasal infiltrate noted, query secondary to aspiration.  Repeat speech evaluation."  Subjective: alert and cooperative  Recommendations for follow up therapy are one component of a multi-disciplinary discharge planning process, led by the attending physician.  Recommendations may be updated based on patient status, additional functional criteria and insurance authorization. Assessment / Plan / Recommendation   03/15/2022  12:00 PM Clinical Impressions Clinical Impression Pt presented  with a mild oropharyngeal dysphagia marked by prolonged oral preparation of solids, mild deficit in base of tongue retraction, leading to mild vallecular residue, and reliable laryngeal vestibule closure with no aspiration. There was only one incident of high/transient penetration with thin liquids (PAS score of 2, considered WFL). Most notable was that when the pt consumed the 13 mm barium pill, it lodged for several minutes in his proximal esophagus (near C6-7), creating discomfort, wincing, and repeated effort to dislodge without success. Ultimately, sequential sips of thin water released the pill and it traveled through esophagus and LES without incident. It is likely that larger pieces of solid foods are causing similar issues. Recommend resuming a PO diet - start with dysphagia 2 (chopped) for now; thin liquids; crush  meds or break into smaller pieces and give with puree.  SLP will follow for education/safety. Addendum: returned to room after study to discuss results and recs with Mr. Secrist and his son, who was at the bedside.  MBS imaging was reviewed with son and we discussed recs to begin a chopped diet with thin liquids. Anticipate advancement to regular solids as mentation and strength improve. Pt may benefit from GI f/u to determine if there is stricture in proximal esophagus.   SLP Visit Diagnosis Dysphagia, oropharyngeal phase (R13.12)     03/15/2022  12:00 PM Treatment Recommendations Treatment Recommendations Therapy as outlined in treatment plan below     03/14/2022   9:20 AM Prognosis Prognosis for Safe Diet Advancement --   03/15/2022  12:00 PM Diet Recommendations SLP Diet Recommendations Dysphagia 2 (Fine chop) solids;Thin liquid Liquid Administration via Cup;Straw Medication Administration Crushed with puree Compensations Slow rate;Small sips/bites     03/15/2022  12:00 PM Other Recommendations Recommended Consults Consider GI evaluation Oral Care Recommendations Oral care BID Follow Up Recommendations No SLP follow up   03/15/2022  12:00 PM Frequency and Duration  Speech Therapy Frequency (ACUTE ONLY) min 1 x/week Treatment Duration 1 week     03/15/2022  12:00 PM Oral Phase Oral Phase Impaired Oral - Puree Delayed oral transit;Reduced posterior propulsion Oral - Mech Soft Reduced posterior propulsion;Delayed oral transit    03/15/2022  12:00 PM Pharyngeal Phase Pharyngeal Phase Impaired Pharyngeal- Nectar Cup Reduced tongue base retraction;Pharyngeal residue - valleculae Pharyngeal- Puree Reduced tongue base retraction;Pharyngeal residue - valleculae Pharyngeal- Mechanical Soft Reduced tongue base retraction;Pharyngeal residue - valleculae    03/15/2022  12:00 PM Cervical Esophageal Phase  Cervical Esophageal Phase -- [name] 03/15/2022, 1:38 PM  Amanda L. Couture, MA CCC/SLP Clinical Specialist - Acute Care  SLP Acute Rehabilitation Services Office number [phone]                      Medications:     Scheduled Medications:  amiodarone  400 mg Oral BID   Followed by   Derrill Memo ON 03/26/2022] amiodarone  400 mg Oral Daily   amoxicillin-clavulanate  1 tablet Oral BID   aspirin EC  325 mg Oral Daily   Chlorhexidine Gluconate Cloth  6 each Topical Daily   clopidogrel  75 mg Oral Daily   dapagliflozin propanediol  10 mg Oral Daily   feeding supplement  237 mL Oral BID BM   heparin  5,000 Units Subcutaneous Q8H   insulin aspart  0-9 Units Subcutaneous TID WC   insulin aspart  3 Units Subcutaneous TID WC   insulin glargine-yfgn  10 Units Subcutaneous Daily   lidocaine  1 patch Transdermal Q24H   lipase/protease/amylase  72,000 Units Oral TID WC   megestrol  400 mg Oral Daily   mirabegron ER  25 mg Oral Daily   mupirocin ointment  1 Application Nasal BID   nystatin  5 mL Oral QID   pantoprazole  40 mg Oral Daily   rosuvastatin  10 mg Oral Daily   sodium chloride flush  3 mL Intravenous Q12H   spironolactone  12.5 mg Oral Daily    Infusions:  sodium chloride      PRN Medications: sodium chloride, acetaminophen **OR** acetaminophen (TYLENOL) oral liquid 160 mg/5 mL **OR** acetaminophen, ipratropium-albuterol, sodium chloride flush    Assessment/Plan   1. Acute on chronic systolic CHF: Echo (0/99) with EF 55-60%.  Echo 12/23 with EF 30-35%, normal RV, normal IVC.  LHC/RHC 12/23 with moderate CAD (60% mLAD, 70% dLAD, 70% D2, 60% dRCA) and low CI 1.87 with normal right and left heart filling pressures.  CAD does not explain fall in EF.  Cardiac MRI showed LV EF 33% with diffuse hypokinesis, RV EF 46% predominantly mid-wall LGE with some subendocardial component in the basal inferolateral wall and focally in the mid lateral wall.  Pattern is not clearly due to prior MI but cannot rule this out given moderate CAD on cath. Alternatively could be due to prior myocarditis. T2 not elevated in  the basal inferolateral wall, so do not suspect active inflammation. Findings NOT consistent with cardiac AL amyloidosis. On exam and by recent [place], he is not volume overloaded.  Creatinine lower at 1.36 today.  - Will add losartan 25 mg daily. If tolerates, then can switch to [employer].  - Does not need a diuretic.  - Continue dapagliflozin and spironolactone.  - Would hold off on inotropic support for the time being, think main problem at this time is PNA/lung infiltrates and failure to thrive/malnutrition.  2. CVA: Right occipital CVA with distal right PCA severe stenosis vs occlusion. Presented with visual loss, now improved.  Neurology has signed off.  - ASA 325 + Plavix 75 x 3 months then ASA 81 daily.  - Crestor 10 daily 3. VT arrest: 1 minute CPR with ROSC.  He is on amiodarone.  Suspect triggered by lateral wall scar (prior MI versus myocarditis).  EP has seen, recommended amiodarone and Lifevest at discharge to follow post-discharge course and decide on ICD down the road depending on his trajectory. Not likely a good ICD candidate given overall prognosis. - Continue amiodarone po.  4. CAD: LHC this admission with moderate CAD (60% mLAD, 70% dLAD, 70% D2, 60% dRCA).  Out of proportion to cardiomyopathy.  As noted above, cMRI suggested most likely prior myocarditis but cannot fully rule out prior MI involving basal lateral wall. No chest pain.  - Continue ASA as above.  - Crestor 10 mg daily.  5. Multiple myeloma with AL amyloidosis isolated to lymph nodes: Followed at [facility] since 2014. Treated with chemo in past. Has known bulky mediastinal/hilar lymphadenopathy. Has been off chemo/steroids for a year d/t frailty 6. Prostate cancer: Treated with radiation in 2022.  7. Right eye diplopia: Chronic, 6th nerve palsy.  8. H/o Whipple procedure for IPMN in 5/23.  9. Influenza A: Has been treated with 5 days oseltamivir this admission.  9. Pulmonary: He is still on 4L HFNC, oxygen saturation 90s  today.  Was not on home oxygen.  CT chest this admission showed extensive bilateral multilobar PNA, R>L and bulky mediastinal/hilar LAN, seen on prior studies. He has a cough, no fever or  leukocytosis and PCT not significantly elevated.  He had a VT arrest with 1 minute CPR, strong suspicion for aspiration pneumonitis.  Pulmonary has seen, no utility to bronchoscopy.   - Continue Augmentin for aspiration coverage.  - Had barium swallow. GI consulted suggested, concern for possible esophageal stricture. Dysphagia 2 diet - SLP following - Incentive spirometry.  10. Frailty/failure to thrive: Did not recover well at all post-Whipple in 5/23 per family.  Has had poor appetite.  Having trouble swallowing.  Albumin low, malnourished.  - Mobilize, PT.  - Too weak currently for CIR.  - Now taking diet as above. Dietician consulted. [place]: With odynophagia.  - Continue Nystatin.   GOC Think he is fairly stable right now from cardiac standpoint, suspect main issue is aspiration pneumonitis and frailty/malnutrition.   Concern for poor long-term prognosis with failure to thrive, VT arrest, PNA. Will need ongoing code discussions, hospice consideration would be reasonable.    Palliative Care following. Remains full code for now.    Length of Stay: [address], Lynder Parents, PA-C  03/16/2022, 8:08 AM  Advanced Heart Failure Team Pager [phone] (M-F; 7a - 5p)  Please contact [facility] for night-coverage after hours (5p -7a ) and weekends on [email or web address]   Patient seen with PA, agree with the above note.   Barium swallow done, possible stricture in proximal esophagus, speech/swallow recommended chopped diet.   He is sitting in chair today, denies dyspnea or chest pain.    Walked a few feet with mobility specialist.   General: NAD, frail.  Neck: No JVD, no thyromegaly or thyroid nodule.  Lungs: Clear to auscultation bilaterally with normal respiratory effort. CV: Nondisplaced PMI.  Heart  regular S1/S2, no S3/S4, no murmur.  No peripheral edema.   Abdomen: Soft, nontender, no hepatosplenomegaly, no distention.  Skin: Intact without lesions or rashes.  Neurologic: Alert and oriented x 3.  Psych: Normal affect. Extremities: No clubbing or cyanosis.  HEENT: Normal.   Frail/malnourished/weak, overall failure to thrive.  Needs to push nutrition => does not like Ensure but will drink Muscle Milk that son brought in.   He is not volume overloaded on exam.  Ok to add losartan 25 mg daily today.  Follow creatinine closely, lower at 1.36.   Consider GI evaluation for proximal esophageal stricture, per [facility].    He remains full code. Palliative care following.   Loralie Champagne 03/16/2022 11:32 AM

## 2022-03-16 NOTE — Progress Notes (Signed)
Mobility Specialist Progress Note:   03/16/22 0902  Mobility  Activity Transferred from bed to chair  Level of Assistance Moderate assist, patient does 50-74%  Assistive Device Front wheel walker  Distance Ambulated (ft) 2 ft  Activity Response Tolerated well  $Mobility charge 1 Mobility   Pt received in bed willing to participate in mobility. No complaints of pain. ModA to stand and step to recliner. Left in chair with call bell in reach and all needs met.   Gareth Eagle Peirce Deveney Mobility Specialist Please contact via [employer] or  Rehab Office at [phone]

## 2022-03-16 NOTE — Progress Notes (Signed)
Physical Therapy Treatment Patient Details Name: William Barron MRN: [id number] DOB: [birth year] Today's Date: 03/16/2022   History of Present Illness 77 year old male admitted 12/22 with right-sided headache and blurred vision. +Acute right PCA stroke. During hospitalization pt found to be Flu + on 03/07/22. Pt also had code blue called on 03/11/22. Pt received compressions before regaining ROSC s/p VT arrest. Pt underwent cardiac cath on 03/11/22. PMHX:  amyloidosis, multiple myeloma, history of prostate cancer, history of pancreatic cancer status post Whipple, dyslipidemia, diabetes.    PT Comments    Patient progressing well towards PT goals. Session focused on transfers and gait training. Requires Mod A of 2 for standing and for gait training with use of RW for support and chair follow. Noted to have flexed posture at trunk/hips/knees and tremors which son reports is baseline. Requires seated rest breaks due to SOB and fatigue. Pt with productive cough during session. Pt motivated to return to PLOF. Discharge recommendation updated to AIR to maximize independence and mobility prior to return home. Son on board with plan. Will follow acutely.   Recommendations for follow up therapy are one component of a multi-disciplinary discharge planning process, led by the attending physician.  Recommendations may be updated based on patient status, additional functional criteria and insurance authorization.  Follow Up Recommendations  Acute inpatient rehab (3hours/day) Can patient physically be transported by private vehicle: No   Assistance Recommended at Discharge Intermittent Supervision/Assistance  Patient can return home with the following A lot of help with walking and/or transfers;A little help with bathing/dressing/bathroom;Assistance with cooking/housework;Help with stairs or ramp for entrance;Assist for transportation   Equipment Recommendations  None recommended by PT    Recommendations  for Other Services       Precautions / Restrictions Precautions Precautions: Fall Precaution Comments: vision impaired Required Braces or Orthoses: Other Brace Other Brace: RLE AFO and shoes Restrictions Weight Bearing Restrictions: No     Mobility  Bed Mobility Overal bed mobility: Needs Assistance Bed Mobility: Supine to Sit     Supine to sit: Min assist     General bed mobility comments: increased time and cues needed for problem solving / sequencing activity. self-distracted on "how do I breathe" during bed mobility    Transfers Overall transfer level: Needs assistance Equipment used: Rolling walker (2 wheels) Transfers: Sit to/from Stand Sit to Stand: Mod assist, +2 physical assistance, +2 safety/equipment           General transfer comment: Mod A of 2 to stand from EOB x1, from chair x2 with cues for hand placement/technique. Flexed posture at hips/trunk and knees which is baseline per son. Full body tremors which are baseline as well.    Ambulation/Gait Ambulation/Gait assistance: Mod assist, +2 physical assistance, +2 safety/equipment Gait Distance (Feet): 12 Feet (+12' + 10') Assistive device: Rolling walker (2 wheels) Gait Pattern/deviations: Step-through pattern, Narrow base of support, Leaning posteriorly, Decreased stride length, Shuffle, Trunk flexed, Knee flexed in stance - right, Knee flexed in stance - left Gait velocity: slow Gait velocity interpretation: <1.31 ft/sec, indicative of household ambulator   General Gait Details: Slow, unsteady gait with flexed posture at trunk/hips/knees throughout, assist with RW management, cues for upright. 2 seated rest breaks. 2-3/4 DOE. Difficult getting 02 reading but on 4L 02.   Stairs             Wheelchair Mobility    Modified Rankin (Stroke Patients Only) Modified Rankin (Stroke Patients Only) Pre-Morbid Rankin Score: Moderate disability Modified Rankin: Moderately  severe disability      Balance Overall balance assessment: Needs assistance Sitting-balance support: No upper extremity supported, Feet supported Sitting balance-Leahy Scale: Fair Sitting balance - Comments: supervision static sitting   Standing balance support: During functional activity, Bilateral upper extremity supported Standing balance-Leahy Scale: Poor Standing balance comment: Requires UE support and external support                            Cognition Arousal/Alertness: Awake/alert Behavior During Therapy: Flat affect Overall Cognitive Status: Impaired/Different from baseline Area of Impairment: Attention, Memory, Following commands, Awareness, Safety/judgement, Problem solving                   Current Attention Level: Selective Memory: Decreased recall of precautions, Decreased short-term memory Following Commands: Follows one step commands consistently Safety/Judgement: Decreased awareness of safety, Decreased awareness of deficits Awareness: Emergent Problem Solving: Slow processing, Decreased initiation, Requires verbal cues, Difficulty sequencing General Comments: oriented and alert. Requires increased time and cues for processing and attention. Pt with some anxiety due to his breathing, "so how do i breathe again?"        Exercises      General Comments General comments (skin integrity, edema, etc.): VSS on 3-4L/min 02 [place] when able to get a reading.      Pertinent Vitals/Pain Pain Assessment Pain Assessment: Faces Faces Pain Scale: Hurts a little bit Pain Location: generalized with movement Pain Descriptors / Indicators: Headache Pain Intervention(s): Monitored during session, Limited activity within patient's tolerance, Repositioned    Home Living                          Prior Function            PT Goals (current goals can now be found in the care plan section) Progress towards PT goals: Progressing toward goals    Frequency    Min  4X/week      PT Plan Discharge plan needs to be updated    Co-evaluation PT/OT/SLP Co-Evaluation/Treatment: Yes Reason for Co-Treatment: Complexity of the patient's impairments (multi-system involvement);For patient/therapist safety;To address functional/ADL transfers PT goals addressed during session: Mobility/safety with mobility;Balance;Proper use of DME OT goals addressed during session: ADL's and self-care      AM-PAC PT "6 Clicks" Mobility   Outcome Measure  Help needed turning from your back to your side while in a flat bed without using bedrails?: A Little Help needed moving from lying on your back to sitting on the side of a flat bed without using bedrails?: A Lot Help needed moving to and from a bed to a chair (including a wheelchair)?: A Lot Help needed standing up from a chair using your arms (e.g., wheelchair or bedside chair)?: A Lot Help needed to walk in hospital room?: Total Help needed climbing 3-5 steps with a railing? : Total 6 Click Score: 11    End of Session Equipment Utilized During Treatment: Gait belt;Oxygen Activity Tolerance: Patient tolerated treatment well Patient left: in chair;with call bell/phone within reach;with family/visitor present (son pushing pt around in chair in hallway, RN aware) Nurse Communication: Mobility status PT Visit Diagnosis: Unsteadiness on feet (R26.81);Other abnormalities of gait and mobility (R26.89);Muscle weakness (generalized) (M62.81);History of falling (Z91.81);Difficulty in walking, not elsewhere classified (R26.2)     Time: 4403-4742 PT Time Calculation (min) (ACUTE ONLY): 35 min  Charges:  $Gait Training: 8-22 mins  Zettie Cooley, DPT Acute Rehabilitation Services Secure chat preferred Office [place] 03/16/2022, 3:38 PM

## 2022-03-17 ENCOUNTER — Inpatient Hospital Stay (HOSPITAL_COMMUNITY): Payer: No Typology Code available for payment source

## 2022-03-17 DIAGNOSIS — E8589 Other amyloidosis: Secondary | ICD-10-CM | POA: Diagnosis not present

## 2022-03-17 DIAGNOSIS — I5022 Chronic systolic (congestive) heart failure: Secondary | ICD-10-CM | POA: Diagnosis not present

## 2022-03-17 DIAGNOSIS — C9000 Multiple myeloma not having achieved remission: Secondary | ICD-10-CM | POA: Diagnosis not present

## 2022-03-17 DIAGNOSIS — I469 Cardiac arrest, cause unspecified: Secondary | ICD-10-CM | POA: Diagnosis not present

## 2022-03-17 DIAGNOSIS — I639 Cerebral infarction, unspecified: Secondary | ICD-10-CM | POA: Diagnosis not present

## 2022-03-17 LAB — BASIC METABOLIC PANEL
Anion gap: 10 (ref 5–15)
BUN: 29 mg/dL — ABNORMAL HIGH (ref 8–23)
CO2: 20 mmol/L — ABNORMAL LOW (ref 22–32)
Calcium: 8 mg/dL — ABNORMAL LOW (ref 8.9–10.3)
Chloride: 111 mmol/L (ref 98–111)
Creatinine, Ser: 1.53 mg/dL — ABNORMAL HIGH (ref 0.61–1.24)
GFR, Estimated: 46 mL/min — ABNORMAL LOW (ref >60)
Glucose, Bld: 193 mg/dL — ABNORMAL HIGH (ref 70–99)
Potassium: 3.9 mmol/L (ref 3.5–5.1)
Sodium: 141 mmol/L (ref 135–145)

## 2022-03-17 LAB — CBC
HCT: 31.8 % — ABNORMAL LOW (ref 39.0–52.0)
Hemoglobin: 9.8 g/dL — ABNORMAL LOW (ref 13.0–17.0)
MCH: 23.9 pg — ABNORMAL LOW (ref 26.0–34.0)
MCHC: 30.8 g/dL (ref 30.0–36.0)
MCV: 77.6 fL — ABNORMAL LOW (ref 80.0–100.0)
Platelets: 431 10*3/uL — ABNORMAL HIGH (ref 150–400)
RBC: 4.1 MIL/uL — ABNORMAL LOW (ref 4.22–5.81)
RDW: 18.8 % — ABNORMAL HIGH (ref 11.5–15.5)
WBC: 5.4 10*3/uL (ref 4.0–10.5)
nRBC: 0 % (ref 0.0–0.2)

## 2022-03-17 LAB — GLUCOSE, CAPILLARY
Glucose-Capillary: 136 mg/dL — ABNORMAL HIGH (ref 70–99)
Glucose-Capillary: 159 mg/dL — ABNORMAL HIGH (ref 70–99)
Glucose-Capillary: 104 mg/dL — ABNORMAL HIGH (ref 70–99)

## 2022-03-17 MED ORDER — JEVITY 1.5 CAL/FIBER PO LIQD
1000.0000 mL | ORAL | Status: DC
  Administered 2022-03-17 – 2022-03-22 (×5): 1000 mL
  Filled 2022-03-17 (×8): qty 1000

## 2022-03-17 MED ORDER — PANCRELIPASE (LIP-PROT-AMYL) 12000-38000 UNITS PO CPEP
72000.0000 [IU] | ORAL_CAPSULE | Freq: Four times a day (QID) | ORAL | Status: DC
  Administered 2022-03-17 – 2022-03-18 (×2): 72000 [IU] via ORAL
  Administered 2022-03-19: 12000 [IU] via ORAL
  Administered 2022-03-20 (×2): 72000 [IU] via ORAL
  Filled 2022-03-17 (×10): qty 6

## 2022-03-17 MED ORDER — RISAQUAD PO CAPS
1.0000 | ORAL_CAPSULE | Freq: Every day | ORAL | Status: DC
  Administered 2022-03-17 – 2022-03-22 (×6): 1 via ORAL
  Filled 2022-03-17 (×6): qty 1

## 2022-03-17 MED ORDER — PROSOURCE TF20 ENFIT COMPATIBL EN LIQD
60.0000 mL | Freq: Every day | ENTERAL | Status: DC
  Administered 2022-03-17 – 2022-03-22 (×5): 60 mL
  Filled 2022-03-17 (×6): qty 60

## 2022-03-17 NOTE — Progress Notes (Signed)
Speech Language Pathology Treatment: Dysphagia  Patient Details Name: William Barron MRN: [id number] DOB: [birth year] Today's Date: 03/17/2022 Time: 0240-9735 SLP Time Calculation (min) (ACUTE ONLY): 11 min  Assessment / Plan / Recommendation Clinical Impression  F/u after 1/1 MBS> has been tolerating a dysphagia 2 diet, thin liquids, crushed meds. PO intake has been poor, and despite William Barron confusion, he is able to state that he is not eating enough.  Observed with thin liquids and applesauce today - he continues to wince when swallowing but denies pain.  Provided encouragement, only minimal cues needed. There are no specific precautions he must follow - the primary issue is related to decreased patency of proximal esophagus. He should continue dysphagia 2/chopped solids for now and continue crushing meds to allow for ease of transition below UES.  SLP will follow. No family present at bedside during visit.   HPI HPI: 77 year old male with history of nonobstructive CAD, mild to moderate aortic stenosis, moderate pulmonary regurgitation, pancreatic CA s/p whipple, diabetes, 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 On 12/28 (early morning), pt"Went into ventricular tachycardia versus polymorphic and received 1 minute of chest compressions.  He was pulseless.  He did not require defibrillation.  ROSC was achieved within 1 minute.  -Unclear etiology.  Lactic acid is normal.  Electrolytes are normal.  Kidney function is stable.  He has been admitted with stroke but is recovering.  Also complicated by influenza A.  He also has a new onset cardiomyopathy so clearly the combination of influenza and congestive heart failure could have resulted in ventricular arrhythmias"per cardiology note.  CXR indicated RLL PNA which could have been a result of aspiration.  BSE generated to assess swallow function 12/29.  Reconsulted 12/30,  Per MD note:  "Bibasal infiltrate noted, query secondary to aspiration.  Repeat speech evaluation."      SLP Plan  Continue with current plan of care      Recommendations for follow up therapy are one component of a multi-disciplinary discharge planning process, led by the attending physician.  Recommendations may be updated based on patient status, additional functional criteria and insurance authorization.    Recommendations  Diet recommendations: Dysphagia 2 (fine chop);Thin liquid Liquids provided via: Cup;Straw Medication Administration: Crushed with puree Supervision: Staff to assist with self feeding Compensations: Slow rate;Small sips/bites                Oral Care Recommendations: Oral care BID Follow Up Recommendations: No SLP follow up Assistance recommended at discharge: Frequent or constant Supervision/Assistance SLP Visit Diagnosis: Dysphagia, oropharyngeal phase (R13.12) Plan: Continue with current plan of care        William Heng L. Tivis Ringer, MA CCC/SLP Clinical Specialist - Acute Care SLP Acute Rehabilitation Services Office number [phone]    William Barron  03/17/2022, 12:00 PM

## 2022-03-17 NOTE — Progress Notes (Signed)
[facility]                                                                              William Barron, is a 77 y.o. male, DOB - 1945/11/04, RWE:[id number] Admit date - 03/05/2022    Outpatient Primary MD for the patient is William Hatchet, MD  LOS - 11  days  Chief Complaint  Patient presents with   Headache       Brief summary   Patient is a 77 year old male with history of pancreatic CA status post Whipple, prostate CA, multiple myeloma, amyloidosis, dyslipidemia, DM, presented to ED with right-sided headache and blurred vision.  Last known well was in the evening of 03/04/2022.  He woke up on the morning of admission on 12/22 with difficulty seeing out of his right eye.  He went to see an ophthalmologist and was referred to ER CT head showed acute/subacute right occipital CVA  03/11/2022: Patient was found unresponsive and pulseless around 4:45 AM.  Patient was found to have gone into VT/VF cardiac arrest in setting of low EF 30 to 35%.  Patient has been successfully resuscitated.  Patient underwent left and right heart cardiac catheterization afterwards.   1/1- did well with MBS- diet order placed 1/3- cortrack placed with tube feeds   Assessment & Plan    Acute right PCA stroke (HCC) right CN VI palsy, chronic -Presented with visual issues, having difficulty seeing half of the clock.  CT head showed right PCA territory stroke. -MRI brain showed acute to early subacute right PCA territory infarct.  Associated mild petechial blood products without frank hemorrhagic transformation or significant residual mass effect.  Underlying moderate chronic microvascular ischemic disease -CTA head and neck showed acute or early subacute infarct in the right occipital lobe, suspected high-grade stenosis versus occlusion of the distal right PCA. -2D echo: EF of 35 to 40% with global hypokinesis, mild AS, mild pulmonary regurg, mild MR, mild AR (EF 50 to 55% on echo in  11/2021) -Per neuro, continue aspirin 325 mg and Plavix 75 mg DAPT for 3 months given intracranial stenosis/occlusion then aspirin 81 mg alone daily indefinitely  -Lipid panel showed LDL 66, at goal, no need of statin per neurology -Hemoglobin A1c 6.5 -Loop recorder per neuro, timing per cardiology? Not sure still needs?   New onset systolic CHF, EF 35 to 61%  -2D echo showed EF of 35 to 40% with global hypokinesis (was 50 to 55% on echo in 11/2021) -Continue Coreg, losartan 27m daily, Farxiga 10 mg daily  -Lasix as needed -cards consult appreciated:  - With elevated creatinine, will hold off on Entresto.  - Does not need a diuretic.  - Continue dapagliflozin and spironolactone.  - Would hold off on inotropic support for the time being, think main problem at this time is PNA/lung infiltrates and failure to thrive/malnutrition.    VT arrest:  - amiodarone.  Suspect triggered by lateral wall scar (prior MI versus myocarditis).  EP has seen, recommend amiodarone and Lifevest at discharge to follow post-discharge course and decide on ICD down the road depending on his trajectory.  - Continue amiodarone po.  Influenza A positive -treat with tamiflu  Bibasilar infiltrates: -Procalcitonin came back normal. -Speech evaluation. -abx -pulmonary toiletry -wean O2 off  Oral thrush/throat pain: -Throat pain has improved. -Complete course of oral nystatin. -may need GI consult for ? stricture  Hypokalemia -Replete  GERD -Continue PPI  Diabetes mellitus type 2, IDDM -SSI  History of pancreatic CA status post Whipple procedure -Continue Creon 72,000 units 3 times daily with meals -Follows with [name]  History of AL amyloidosis ([place]),  Multiple myeloma ([place]) -Outpatient follow-up with his oncologist, [name] at [facility].  History of prostate CA -Continue outpatient follow-up with urology, Dr. Jeffie Pollock, completed XRT on 06/02/2020    Lesion of parotid gland -MRI  brain incidentally also showed 1.8 cm T2 hypointense lesion within the posterior aspect of the right parotid gland, indeterminate.  Recommended nonemergent outpatient ENT evaluation -CTA showed 1.6 cm partially calcified lesion in the posterior aspect of right parotid gland with multiple surrounding smaller lesion, could potentially represent primary parotid neoplasm benign or malignant or enlarged lymph nodes, recommended outpatient ENT evaluation  Malnutrition -cortrack placed 1/3    Code Status: Full code DVT Prophylaxis:  Place and maintain sequential compression device Start: 03/15/22 1355 heparin injection 5,000 Units Start: 03/11/22 2200 SCD's Start: 03/05/22 2235  Level of Care: Level of care: Telemetry Cardiac Family Communication: Discussed with patient Disposition Plan:      CIR vs SNF    Consultants:   Neurology Cardiology Electrophysiology. Pulmonary Palliative care   Medications  amiodarone  400 mg Oral BID   Followed by   Derrill Memo ON 03/26/2022] amiodarone  400 mg Oral Daily   amoxicillin-clavulanate  1 tablet Oral BID   aspirin EC  325 mg Oral Daily   Chlorhexidine Gluconate Cloth  6 each Topical Daily   clopidogrel  75 mg Oral Daily   dapagliflozin propanediol  10 mg Oral Daily   diclofenac Sodium  2 g Topical QID   feeding supplement  237 mL Oral BID BM   heparin  5,000 Units Subcutaneous Q8H   insulin aspart  0-9 Units Subcutaneous TID WC   insulin aspart  3 Units Subcutaneous TID WC   insulin glargine-yfgn  10 Units Subcutaneous Daily   lidocaine  1 patch Transdermal Q24H   lipase/protease/amylase  72,000 Units Oral TID WC   losartan  25 mg Oral Daily   megestrol  400 mg Oral Daily   mirabegron ER  25 mg Oral Daily   mupirocin ointment  1 Application Nasal BID   nystatin  5 mL Oral QID   pantoprazole  40 mg Oral BID   rosuvastatin  10 mg Oral Daily   sodium chloride flush  3 mL Intravenous Q12H   spironolactone  12.5 mg Oral Daily       Subjective:  Eating and drinking but not enough for daily needs   Objective:   Vitals:   03/16/22 2315 03/17/22 0302 03/17/22 0700 03/17/22 0915  BP: (!) 116/59 119/62 109/64 108/61  Pulse:  63 61   Resp: _0 Temp: (!) 97.5 F (36.4 C) 97.7 F (36.5 C) 97.7 F (36.5 C)   TempSrc: Oral Oral Oral   SpO2: 94% 94% 97% 94%  Weight:      Height:        Intake/Output Summary (Last 24 hours) at 03/17/2022 1151 Last data filed at 03/17/2022 0900 Gross per 24 hour  Intake 1800 ml  Output 1650 ml  Net 150 ml  Wt Readings from Last 3 Encounters:  03/13/22 57.2 kg  01/28/22 66.3 kg  01/14/22 67.2 kg   Physical Exam    General: Appearance:    Well developed, well nourished male in no acute distress     Lungs:     Off O2,  respirations unlabored  Heart:    Normal heart rate.   MS:   All extremities are intact.   Neurologic:   Awake, alert         Data Reviewed:  I have personally reviewed following labs    CBC Lab Results  Component Value Date   WBC 5.4 03/17/2022   RBC 4.10 (L) 03/17/2022   HGB 9.8 (L) 03/17/2022   HCT 31.8 (L) 03/17/2022   MCV 77.6 (L) 03/17/2022   MCH 23.9 (L) 03/17/2022   PLT 431 (H) 03/17/2022   MCHC 30.8 03/17/2022   RDW 18.8 (H) 03/17/2022   LYMPHSABS 0.6 (L) 03/15/2022   MONOABS 0.2 03/15/2022   EOSABS 0.0 03/15/2022   BASOSABS 0.0 16/38/4665     Last metabolic panel Lab Results  Component Value Date   NA 141 03/17/2022   K 3.9 03/17/2022   CL 111 03/17/2022   CO2 20 (L) 03/17/2022   BUN 29 (H) 03/17/2022   CREATININE 1.53 (H) 03/17/2022   GLUCOSE 193 (H) 03/17/2022   GFRNONAA 46 (L) 03/17/2022   GFRAA >60 07/13/2018   CALCIUM 8.0 (L) 03/17/2022   PHOS 2.6 03/15/2022   PROT 5.6 (L) 03/06/2022   ALBUMIN 2.3 (L) 03/15/2022   LABGLOB 2.7 06/21/2018   AGRATIO 1.3 06/21/2018   BILITOT 0.5 03/06/2022   ALKPHOS 564 (H) 03/06/2022   AST 28 03/06/2022   ALT 26 03/06/2022   ANIONGAP 10 03/17/2022    CBG  (last 3)  Recent Labs    03/16/22 1625 03/16/22 2133 03/17/22 0626  GLUCAP 148* 186* 136*      Coagulation Profile: No results for input(s): "INR", "PROTIME" in the last 168 hours.    Radiology Studies: I have personally reviewed the imaging studies  DG SWALLOW [facility] SPEECH PATH  Result Date: 03/15/2022 Table formatting from the original result was not included. Objective Swallowing Evaluation: Type of Study: MBS-Modified Barium Swallow Study  Patient Details Name: William Barron MRN: [id number] Date of Birth: 1945-08-26 Today's Date: 03/15/2022 Time: SLP Start Time (ACUTE ONLY): 57 -SLP Stop Time (ACUTE ONLY): 9390 SLP Time Calculation (min) (ACUTE ONLY): 28 min Past Medical History: Past Medical History: Diagnosis Date  Amyloidosis ([place])   Diabetes mellitus without complication ([place])   Feeding tube dysfunction, initial encounter 08/12/2021  GERD (gastroesophageal reflux disease)   Hearing loss   Has hearing aids  History of kidney stones   Hyperlipidemia   Jejunostomy tube present ([place])   Multiple myeloma ([place])   and amylodosis   Nephrolithiasis   Occasional tremors   Pancreatic insufficiency   Pneumonia   Prostate cancer ([place])   Prostatitis   acute and chronic  Skin cancer  Past Surgical History: Past Surgical History: Procedure Laterality Date  APPENDECTOMY    CARDIAC CATHETERIZATION N/A 06/13/2015  Procedure: Left Heart Cath and Coronary Angiography;  Surgeon: Jettie Booze, MD;  Location: [place] [facility];  Service: Cardiovascular;  Laterality: N/A;  COLONOSCOPY  04/07/2000  ELBOW SURGERY  03/15/2001  right  ESOPHAGOGASTRODUODENOSCOPY (EGD) WITH PROPOFOL N/A 09/07/2021  Procedure: ESOPHAGOGASTRODUODENOSCOPY (EGD) WITH PROPOFOL;  Surgeon: Irene Shipper, MD;  Location: [place];  Service: Gastroenterology;  Laterality: N/A;  EXTRACORPOREAL SHOCK  WAVE LITHOTRIPSY Left 07/17/2018  Procedure: EXTRACORPOREAL SHOCK WAVE LITHOTRIPSY (ESWL);  Surgeon: [name], MD;  Location: [facility];   Service: Urology;  Laterality: Left;  EXTRACORPOREAL SHOCK WAVE LITHOTRIPSY Right 12/08/2020  Procedure: RIGHT EXTRACORPOREAL SHOCK WAVE LITHOTRIPSY (ESWL);  Surgeon: Raynelle Bring, MD;  Location: [facility];  Service: Urology;  Laterality: Right;  FOOT ARTHRODESIS Right 04/15/2021  Procedure: RIGHT SUBTALAR AND TALONAVICULAR FUSION;  Surgeon: Newt Minion, MD;  Location: [place];  Service: Orthopedics;  Laterality: Right;  HERNIA REPAIR    IR MECH REMOV OBSTRUC MAT ANY COLON TUBE W/FLUORO  08/27/2021  IR REPLC GASTRO/COLONIC TUBE PERCUT W/FLUORO  09/08/2021  RIGHT/LEFT HEART CATH AND CORONARY ANGIOGRAPHY N/A 03/11/2022  Procedure: RIGHT/LEFT HEART CATH AND CORONARY ANGIOGRAPHY;  Surgeon: Jettie Booze, MD;  Location: [place] [facility];  Service: Cardiovascular;  Laterality: N/A;  ROTATOR CUFF REPAIR    WHIPPLE PROCEDURE  07/13/2021 HPI: 77 year old male with history of nonobstructive CAD, mild to moderate aortic stenosis, moderate pulmonary regurgitation, pancreatic CA s/p whipple, diabetes, 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 On 12/28 (early morning), pt"Went into ventricular tachycardia versus polymorphic and received 1 minute of chest compressions.  He was pulseless.  He did not require defibrillation.  ROSC was achieved within 1 minute.  -Unclear etiology.  Lactic acid is normal.  Electrolytes are normal.  Kidney function is stable.  He has been admitted with stroke but is recovering.  Also complicated by influenza A.  He also has a new onset cardiomyopathy so clearly the combination of influenza and congestive heart failure could have resulted in ventricular arrhythmias"per cardiology note.  CXR indicated RLL PNA which could have been a result of aspiration.  BSE generated to assess swallow function 12/29.  Reconsulted 12/30,  Per MD note: "Bibasal infiltrate noted, query secondary to aspiration.  Repeat speech  evaluation."  Subjective: alert and cooperative  Recommendations for follow up therapy are one component of a multi-disciplinary discharge planning process, led by the attending physician.  Recommendations may be updated based on patient status, additional functional criteria and insurance authorization. Assessment / Plan / Recommendation   03/15/2022  12:00 PM Clinical Impressions Clinical Impression Pt presented with a mild oropharyngeal dysphagia marked by prolonged oral preparation of solids, mild deficit in base of tongue retraction, leading to mild vallecular residue, and reliable laryngeal vestibule closure with no aspiration. There was only one incident of high/transient penetration with thin liquids (PAS score of 2, considered WFL). Most notable was that when the pt consumed the 13 mm barium pill, it lodged for several minutes in his proximal esophagus (near C6-7), creating discomfort, wincing, and repeated effort to dislodge without success. Ultimately, sequential sips of thin water released the pill and it traveled through esophagus and LES without incident. It is likely that larger pieces of solid foods are causing similar issues. Recommend resuming a PO diet - start with dysphagia 2 (chopped) for now; thin liquids; crush meds or break into smaller pieces and give with puree.  SLP will follow for education/safety. Addendum: returned to room after study to discuss results and recs with Mr. Thorington and his son, who was at the bedside.  MBS imaging was reviewed with son and we discussed recs to begin a chopped diet with thin liquids. Anticipate advancement to regular solids as mentation and strength improve. Pt may benefit from GI f/u to determine if there is stricture in proximal esophagus.   SLP Visit  Diagnosis Dysphagia, oropharyngeal phase (R13.12)     03/15/2022  12:00 PM Treatment Recommendations Treatment Recommendations Therapy as outlined in treatment plan below     03/14/2022   9:20 AM Prognosis  Prognosis for Safe Diet Advancement --   03/15/2022  12:00 PM Diet Recommendations SLP Diet Recommendations Dysphagia 2 (Fine chop) solids;Thin liquid Liquid Administration via Cup;Straw Medication Administration Crushed with puree Compensations Slow rate;Small sips/bites     03/15/2022  12:00 PM Other Recommendations Recommended Consults Consider GI evaluation Oral Care Recommendations Oral care BID Follow Up Recommendations No SLP follow up   03/15/2022  12:00 PM Frequency and Duration  Speech Therapy Frequency (ACUTE ONLY) min 1 x/week Treatment Duration 1 week     03/15/2022  12:00 PM Oral Phase Oral Phase Impaired Oral - Puree Delayed oral transit;Reduced posterior propulsion Oral - Mech Soft Reduced posterior propulsion;Delayed oral transit    03/15/2022  12:00 PM Pharyngeal Phase Pharyngeal Phase Impaired Pharyngeal- Nectar Cup Reduced tongue base retraction;Pharyngeal residue - valleculae Pharyngeal- Puree Reduced tongue base retraction;Pharyngeal residue - valleculae Pharyngeal- Mechanical Soft Reduced tongue base retraction;Pharyngeal residue - valleculae    03/15/2022  12:00 PM Cervical Esophageal Phase  Cervical Esophageal Phase -- [name] 03/15/2022, 1:38 PM  Amanda L. Tivis Ringer, MA CCC/SLP Clinical Specialist - Acute Care SLP Acute Rehabilitation Services Office number [phone]                        Geradine Girt DO [facility] 03/17/2022, 11:51 AM  Available via Epic secure chat 7am-7pm After 7 pm, please refer to night coverage provider listed on amion.

## 2022-03-17 NOTE — Progress Notes (Signed)
Mobility Specialist Progress Note:   03/17/22 0843  Mobility  Activity Transferred from bed to chair  Level of Assistance Moderate assist, patient does 50-74%  Assistive Device Front wheel walker  Distance Ambulated (ft) 2 ft  Activity Response Tolerated well  $Mobility charge 1 Mobility   Pt received in bed willing to participate in mobility. No complaints of pain. ModA to stand.Left in chair with call bell in reach and all needs met.   Gareth Eagle Lakelyn Straus Mobility Specialist Please contact via [employer] or  Rehab Office at [phone]

## 2022-03-17 NOTE — TOC Initial Note (Signed)
Transition of Care [facility]) - Initial/Assessment Note    Patient Details  Name: William Barron MRN: [id number] Date of Birth: 1946/03/08  Transition of [facility]) CM/SW Contact:    Vinie Sill, LCSW Phone Number: 03/17/2022, 2:53 PM  Clinical Narrative:                  CSW spoke with the patient's spouse- CSW introduced self and explained role. She confirmed, interest in short term rehab at SNF, preferred SNF is [name]. CSW explained the SNF process using VA benefits and informed [name] did reach out as well to CSW.  CSW explain patient  with tube feeds at this time and will need to wait until the patient is closer to being medically stable before referral is sent to the [place] and [name]. She states understanding.   Sge states she reported to the [place] patient wad admitted on 12/12 w/in 72 hrs - reference # [id number].   TOC will continue to follow and assist with discharge planning.  Thurmond Butts, MSW, LCSW Clinical Social Worker    Expected Discharge Plan: Skilled Nursing Facility Barriers to Discharge: Ship broker, Continued Medical Work up, SNF Pending bed offer   Patient Goals and CMS Choice Patient states their goals for this hospitalization and ongoing recovery are:: Patient states he would like to get the best he can, but he does not know what that is CMS Medicare.gov Compare Post Acute Care list provided to:: Patient Choice offered to / list presented to : Patient      Expected Discharge Plan and Services In-house Referral: Clinical Social Work     Living arrangements for the past 2 months: Single Family Home                                      Prior Living Arrangements/Services Living arrangements for the past 2 months: Single Family Home Lives with:: Self Patient language and need for interpreter reviewed:: No Do you feel safe going back to the place where you live?: Yes      Need for Family Participation in Patient Care:  Yes (Comment) Care giver support system in place?: Yes (comment)   Criminal Activity/Legal Involvement Pertinent to Current Situation/Hospitalization: No - Comment as needed  Activities of Daily Living Home Assistive Devices/Equipment: Walker (specify type), Eyeglasses ADL Screening (condition at time of admission) Patient's cognitive ability adequate to safely complete daily activities?: Yes Is the patient deaf or have difficulty hearing?: No Does the patient have difficulty seeing, even when wearing glasses/contacts?: No Does the patient have difficulty concentrating, remembering, or making decisions?: No Patient able to express need for assistance with ADLs?: Yes Does the patient have difficulty dressing or bathing?: Yes Independently performs ADLs?: No Communication: Independent Dressing (OT): Needs assistance Is this a change from baseline?: Change from baseline, expected to last >3 days Grooming: Needs assistance Is this a change from baseline?: Change from baseline, expected to last >3 days Feeding: Independent Is this a change from baseline?: Change from baseline, expected to last <3 days Bathing: Needs assistance Is this a change from baseline?: Change from baseline, expected to last <3 days Toileting: Needs assistance Is this a change from baseline?: Change from baseline, expected to last <3 days In/Out Bed: Needs assistance Is this a change from baseline?: Change from baseline, expected to last <3 days Walks in Home: Needs assistance Is this a change from baseline?:  Change from baseline, expected to last <3 days Does the patient have difficulty walking or climbing stairs?: Yes Weakness of Legs: Both Weakness of Arms/Hands: Both  Permission Sought/Granted                  Emotional Assessment Appearance:: Appears stated age Attitude/Demeanor/Rapport: Engaged Affect (typically observed): Adaptable Orientation: : Oriented to Self, Oriented to Place, Oriented to   Time, Oriented to Situation Alcohol / Substance Use: Not Applicable Psych Involvement: No (comment)  Admission diagnosis:  Occipital stroke ([place]) [I63.9] Acute ischemic stroke [facility]) [I63.9] Patient Active Problem List   Diagnosis Date Noted   Cardiac arrest ([place]) 94/17/4081   Acute systolic CHF (congestive heart failure) ([place]) 03/08/2022   Influenza A with pneumonia 03/08/2022   Occipital stroke ([place]) 03/05/2022   Leg swelling 01/20/2022   Nonrheumatic aortic valve stenosis 01/20/2022   Abnormal CT of the abdomen    Pressure injury of skin 09/05/2021   Goals of care, counseling/discussion 09/03/2021   Postoperative anemia due to acute blood loss 08/31/2021   Reactive thrombocytosis 08/31/2021   Malnutrition of moderate degree 08/19/2021   Debility 08/19/2021   Asymptomatic bacteriuria 08/15/2021   H/O Whipple procedure 08/13/2021   Chronic hiccups 08/13/2021   Candida esophagitis ([place]) 08/13/2021   Insulin-requiring or dependent type II diabetes mellitus ([place]) 08/13/2021   Anemia 08/13/2021   Hypoalbuminemia 08/13/2021   Frequent PVCs 08/13/2021   Prolonged QT interval 08/13/2021   Facial droop 08/13/2021   Hormone replacement therapy 08/13/2021   Lesion of parotid gland 08/13/2021   Protein-calorie malnutrition, severe ([place]) 08/13/2021   Feeding difficulties 08/12/2021   Posterior tibial tendon dysfunction, right 04/15/2021   Posterior tibial tendinitis, right leg    Prostate cancer ([place]) 02/11/2020   Orthostatic tremor 10/01/2019   Dyslipidemia 09/12/2019   Nonrheumatic pulmonary valve insufficiency 09/12/2019   Red blood cell antibody positive, compatible PRBC difficult to obtain 02/15/2018   Diplopia 01/03/2018   Benign essential tremor 01/03/2018   Pain in joint of right ankle 05/17/2017   Foot pain 05/17/2017   Peroneal tendinitis 05/17/2017   Depression with anxiety 01/03/2017   Insomnia 05/05/2016   Coronary artery calcification seen on CAT scan    DOE  (dyspnea on exertion)    Fatigue 02/27/2015   Elevated homocysteine 09/26/2014   Multiple myeloma ([place]) 09/26/2014   Acquired pes planus of right foot 07/02/2014   Posterior tibial tendon dysfunction 07/02/2014   Encounter for antineoplastic chemotherapy 01/02/2014   Kahler disease ([place]) 01/02/2014   Avitaminosis D 01/02/2014   Amyloidosis ([place]) 03/26/2013   Enlarged lymph node 02/14/2013   Pure hypercholesterolemia 03/29/2011   Memory disorder 09/21/2010   Labile hypertension 09/21/2010   PCP:  [name], MD Pharmacy:   [id number] [address] [place]Phone: [phone]: [phone] [place] [address]Phone: [phone]: [phone] [facility] [address]: [phone]: [phone]    Social Determinants of Health (SDOH) Social History: [place] No Food Insecurity (03/09/2022)  Housing: Low Risk  (03/09/2022)  Transportation Needs: No Transportation Needs (03/09/2022)  Utilities: Not At Risk (03/09/2022)  Tobacco Use: Medium Risk (03/11/2022)   SDOH Interventions:     Readmission Risk Interventions  No data to display

## 2022-03-17 NOTE — Progress Notes (Signed)
Palliative Medicine Progress Note   Patient Name: William Barron       Date: 03/17/2022 DOB: [birth year]  Age: 77 y.o. MRN#: [id number] Attending Physician: Geradine Girt, DO Primary Care Physician: [name], MD Admit Date: 03/05/2022   HPI/Patient Profile: 77 y.o. male  with past medical history of multiple myeloma and amyloidosis (followed by oncology at [facility]), pancreatic cyst status post Whipple procedure May 2023, and diabetes mellitus type 2 who presented to [facility] ED on 03/05/2022 with right-sided vision changes and headache. CT head showed acute/subacute right occipital CVA.    On 12/24, he was found to be positive for influenza A.  On 12/28, he was found unresponsive and pulseless. It was determined he had gone into VT/VF cardiac arrest.  He was successfully resuscitated and underwent left and right cardiac catheterization. He was found to have new-onset systolic heart failure with reduced EF 35-40%.  On 12/30, CT chest showed severe multilobar bilateral (right greater than left) pneumonia.   Palliative Medicine has been consulted for goals of care.  Subjective: Chart reviewed and update received from RN. Patient with minimal PO intake. Cortrak placed today.  Bedside visit. Patient is sleeping and I did not attempt to wake him. Spoke with son/[name] (in person) and wife/Rebecca (by phone). Reviewed plan for time trial of tube feeding to improve nutritional status in the setting of poor PO intake.   We reviewed code status. In the event of cardiac arrest (no pulse), family agrees to DNR status with no CPR, defibrillation, ACLS medications, or intubation.   Per family, goal of care remains medical optimization and acute inpatient rehab (CIR). Offered ongoing palliative support.     Objective:  Physical Exam Vitals reviewed.  Constitutional:      General: He is sleeping. He is not in acute distress.    Appearance: He is ill-appearing.     Comments: Frail  Pulmonary:     Effort: Pulmonary effort is normal.             Vital Signs: BP 108/61 (BP Location: Right Arm)   Pulse 61   Temp 97.7 F (36.5 C) (Oral)   Resp 20   Ht 5' 5" (1.651 m)   Wt 57.2 kg   SpO2 94%   BMI 20.98 kg/m  SpO2: SpO2: 94 % O2 Device:  O2 Device: Room Air O2 Flow Rate: O2 Flow Rate (L/min): 1 L/min     Palliative Medicine Assessment & Plan   Assessment: Principal Problem:   Occipital stroke (HCC) Active Problems:   Amyloidosis ([place])   Multiple myeloma (HCC)   Dyslipidemia   H/O Whipple procedure   Insulin-requiring or dependent type II diabetes mellitus ([place])   Lesion of parotid gland   Acute systolic CHF (congestive heart failure) (HCC)   Influenza A with pneumonia   Cardiac arrest [facility])    Recommendations/Plan: Code status changed to DNR Continue full scope interventions Goal of care - rehab to improve functional status PMT will continue to follow  This NP will return to service 1/7. Please call [phone] for urgent needs.   Prognosis:  Unable to determine  Discharge Planning: To Be Determined    Thank you for allowing the Palliative Medicine Team to assist in the care of this patient.   MDM - [place]   Lavena Bullion, NP   Please contact Palliative Medicine Team phone at [phone] for questions and concerns.  For individual providers, please see AMION.

## 2022-03-17 NOTE — Progress Notes (Signed)
Physical Therapy Treatment Patient Details Name: William Barron MRN: [id number] DOB: 1945/08/19 Today's Date: 03/17/2022   History of Present Illness 77 year old male admitted 12/22 with right-sided headache and blurred vision. +Acute right PCA stroke. During hospitalization pt found to be Flu + on 03/07/22. Pt also had code blue called on 03/11/22. Pt received compressions before regaining ROSC s/p VT arrest. Pt underwent cardiac cath on 03/11/22. PMHX:  amyloidosis, multiple myeloma, history of prostate cancer, history of pancreatic cancer status post Whipple, dyslipidemia, diabetes.    PT Comments    Pt is very motivated to participate and to make progress despite his deficits in endurance that limit him to short bouts of standing activity at a time. While pt could not ambulate far distances (also limited due to lack of chair follow for pt safety today) before fatiguing, he was motivated and able to perform several reps of each activity with seated rest breaks between each rep. Focused session on improving upright posture and lower extremity stability in standing and with gait within the room, transfer training, and lower extremity exercises to improve strength and endurance. Pt only required min-modAx1 to transfer to stand and ambulate short distances with a RW today. As pt is making good progress in independence and activity tolerance, continue to recommend AIR. Will continue to follow acutely.     Recommendations for follow up therapy are one component of a multi-disciplinary discharge planning process, led by the attending physician.  Recommendations may be updated based on patient status, additional functional criteria and insurance authorization.  Follow Up Recommendations  Acute inpatient rehab (3hours/day) Can patient physically be transported by private vehicle: Yes   Assistance Recommended at Discharge Intermittent Supervision/Assistance  Patient can return home with the following  A lot of help with walking and/or transfers;A little help with bathing/dressing/bathroom;Assistance with cooking/housework;Help with stairs or ramp for entrance;Assist for transportation   Equipment Recommendations  None recommended by PT    Recommendations for Other Services       Precautions / Restrictions Precautions Precautions: Fall Precaution Comments: vision impaired Required Braces or Orthoses: Other Brace Other Brace: RLE AFO and shoes Restrictions Weight Bearing Restrictions: No     Mobility  Bed Mobility Overal bed mobility: Needs Assistance Bed Mobility: Supine to Sit, Sit to Supine     Supine to sit: Min guard, HOB elevated Sit to supine: Min guard, HOB elevated   General bed mobility comments: increased time, but pt able to problem solve how to transition sit <> supine R EOB with use of bed rails and HOB elevated without verbal cues, min guard for safety    Transfers Overall transfer level: Needs assistance Equipment used: Rolling walker (2 wheels) Transfers: Sit to/from Stand Sit to Stand: Mod assist, Min assist           General transfer comment: Pt pulling up on RW each rep but places feet appropriately and appears to visually inspect and correct feet placement each rep without cuing. Pt requiring min-modA to transition weight anteriorly and gain stability and upright posture within RW, 3x from EOB and 1x from chair.    Ambulation/Gait Ambulation/Gait assistance: Min assist, Mod assist Gait Distance (Feet): 4 Feet (x2 bouts of ~4 ft each bout) Assistive device: Rolling walker (2 wheels) Gait Pattern/deviations: Step-through pattern, Narrow base of support, Decreased stride length, Trunk flexed, Knee flexed in stance - right, Knee flexed in stance - left, Shuffle Gait velocity: slow Gait velocity interpretation: <1.31 ft/sec, indicative of household ambulator  General Gait Details: Slow, unsteady gait with flexed posture at trunk/hips/knees  throughout. Pt with narrow feet placement at times but knees apart/hips abducted. Pt cued to improve leg alignment and upright posture. Pt taking unsteady shuffling steps bed <> chair with a RW and his R AFO donned and bil shoes donned. Min-modA for stability throughout.   Stairs             Wheelchair Mobility    Modified Rankin (Stroke Patients Only) Modified Rankin (Stroke Patients Only) Pre-Morbid Rankin Score: Moderate disability Modified Rankin: Moderately severe disability     Balance Overall balance assessment: Needs assistance Sitting-balance support: Single extremity supported, Bilateral upper extremity supported, Feet supported Sitting balance-Leahy Scale: Poor Sitting balance - Comments: Prefers UE support, supervision for static sitting balance   Standing balance support: Bilateral upper extremity supported, During functional activity, Reliant on assistive device for balance Standing balance-Leahy Scale: Poor Standing balance comment: Reliant on UE support and up to [employer] Arousal/Alertness: Awake/alert Behavior During Therapy: WFL for tasks assessed/performed Overall Cognitive Status: Impaired/Different from baseline Area of Impairment: Attention, Memory, Following commands, Awareness, Safety/judgement, Problem solving                   Current Attention Level: Selective Memory: Decreased recall of precautions, Decreased short-term memory Following Commands: Follows one step commands consistently, Follows one step commands with increased time Safety/Judgement: Decreased awareness of safety, Decreased awareness of deficits Awareness: Emergent Problem Solving: Slow processing, Decreased initiation, Requires verbal cues, Difficulty sequencing General Comments: oriented and alert. Requires increased time and cues for processing and attention. Pt repeatedly asking "I wonder why I had a heart attack".         Exercises General Exercises - Lower Extremity Long Arc Quad: AROM, Both, 10 reps, Seated (x3 second hold with eccentric control) Other Exercises Other Exercises: sit <> stand 4x    General Comments General comments (skin integrity, edema, etc.): SpO2 decreased to 80s% with standing mobility on 6L O2 intermittently, rebounded to 90s% quickly with seated rest break on 6L, VSS on 5L end of session with pt supine at rest      Pertinent Vitals/Pain Pain Assessment Pain Assessment: Faces Faces Pain Scale: Hurts a little bit Pain Location: generalized with movement Pain Intervention(s): Limited activity within patient's tolerance, Monitored during session, Repositioned    Home Living                          Prior Function            PT Goals (current goals can now be found in the care plan section) Acute Rehab PT Goals Patient Stated Goal: to improve PT Goal Formulation: With patient Time For Goal Achievement: 03/20/22 Potential to Achieve Goals: Good Progress towards PT goals: Progressing toward goals    Frequency    Min 4X/week      PT Plan Current plan remains appropriate    Co-evaluation              AM-PAC PT "6 Clicks" Mobility   Outcome Measure  Help needed turning from your back to your side while in a flat bed without using bedrails?: A Little Help needed moving from lying on your back to sitting on the side of a flat bed without using bedrails?: A  Little Help needed moving to and from a bed to a chair (including a wheelchair)?: A Lot Help needed standing up from a chair using your arms (e.g., wheelchair or bedside chair)?: A Lot Help needed to walk in hospital room?: Total Help needed climbing 3-5 steps with a railing? : Total 6 Click Score: 12    End of Session Equipment Utilized During Treatment: Gait belt;Oxygen Activity Tolerance: Patient tolerated treatment well Patient left: in bed;with call bell/phone within reach;with bed alarm  set;with SCD's reapplied Nurse Communication: Mobility status;Other (comment) (sats) PT Visit Diagnosis: Unsteadiness on feet (R26.81);Other abnormalities of gait and mobility (R26.89);Muscle weakness (generalized) (M62.81);History of falling (Z91.81);Difficulty in walking, not elsewhere classified (R26.2)     Time: 3552-1747 PT Time Calculation (min) (ACUTE ONLY): 42 min  Charges:  $Gait Training: 8-22 mins $Therapeutic Exercise: 8-22 mins $Therapeutic Activity: 8-22 mins                     Moishe Spice, PT, DPT Acute Rehabilitation Services  Office: [place] 03/17/2022, 4:48 PM

## 2022-03-17 NOTE — Progress Notes (Signed)
Inpatient Rehab Admissions Coordinator:    CIR following at a distance, but pt. Currently is more SNF appropriate. Yesterday, son asked that I follow 1-2 more sessions to see if Pt. Progressed with therapies after his PO intake improves. I will follow, but I think that SNF is best course for pt. Based on current status.   Clemens Catholic, [place], [place] Admissions Coordinator  [phone] ([place]) [phone] (office)

## 2022-03-17 NOTE — Progress Notes (Addendum)
Patient ID: William Barron, male   DOB: [birth year], 77 y.o.   MRN: [id number]   Advanced Heart Failure Rounding Note  PCP-Cardiologist: Minus Breeding, MD   Subjective:    No current supp O2 requirements. O2 sats 95% on RA. Denies CP and dyspnea.   Losartan added yesterday. SBP ok, 110s.   SCr 1.36>>1.53 today. K 3.9  CO2 20    CT chest: Extensive bilateral multilobar PNA, R>L.  Bulky mediastinal/hilar LAN, seen on prior studies.   Objective:   Weight Range: 57.2 kg Body mass index is 20.98 kg/m.   Vital Signs:   Temp:  [97.5 F (36.4 C)-98.4 F (36.9 C)] 97.7 F (36.5 C) (01/03 0700) Pulse Rate:  [61-76] 61 (01/03 0700) Resp:  [15-26] 20 (01/03 0700) BP: (102-121)/(58-64) 109/64 (01/03 0700) SpO2:  [94 %-97 %] 97 % (01/03 0700) Last BM Date : 03/16/22  Weight change: Filed Weights   03/08/22 0500 03/11/22 0906 03/13/22 0410  Weight: 64.7 kg 58.5 kg 57.2 kg    Intake/Output:   Intake/Output Summary (Last 24 hours) at 03/17/2022 0756 Last data filed at 03/17/2022 0500 Gross per 24 hour  Intake 1800 ml  Output 1550 ml  Net 250 ml      Physical Exam    General:  weak/ frail appearing. No respiratory difficulty HEENT: normal Neck: supple. no JVD. Carotids 2+ bilat; no bruits. No lymphadenopathy or thyromegaly appreciated. Cor: PMI nondisplaced. Regular rate & rhythm. No rubs, gallops or murmurs. Lungs: clear Abdomen: soft, nontender, nondistended. No hepatosplenomegaly. No bruits or masses. Good bowel sounds. Extremities: no cyanosis, clubbing, rash, edema Neuro: alert & oriented x 3, cranial nerves grossly intact. moves all 4 extremities w/o difficulty. Affect pleasant.   Telemetry   NSR 60s  Labs    CBC Recent Labs    03/15/22 0059 03/16/22 0130 03/17/22 0059  WBC 4.8 5.1 5.4  NEUTROABS 4.0  --   --   HGB 10.0* 9.9* 9.8*  HCT 33.5* 33.7* 31.8*  MCV 77.7* 78.6* 77.6*  PLT 338 409* 557*   Basic Metabolic Panel Recent Labs     03/15/22 0059 03/15/22 0100 03/16/22 0130 03/17/22 0059  NA  --  139 143 141  K  --  3.7 4.1 3.9  CL  --  109 112* 111  CO2  --  19* 19* 20*  GLUCOSE  --  151* 115* 193*  BUN  --  40* 32* 29*  CREATININE  --  1.72* 1.36* 1.53*  CALCIUM  --  7.8* 8.1* 8.0*  MG 2.3  --   --   --   PHOS  --  2.6  --   --    Liver Function Tests Recent Labs    03/15/22 0100  ALBUMIN 2.3*   No results for input(s): "LIPASE", "AMYLASE" in the last 72 hours. Cardiac Enzymes No results for input(s): "CKTOTAL", "CKMB", "CKMBINDEX", "TROPONINI" in the last 72 hours.  BNP: BNP (last 3 results) Recent Labs    03/07/22 1237  BNP 1,326.2*    ProBNP (last 3 results) No results for input(s): "PROBNP" in the last 8760 hours.   D-Dimer No results for input(s): "DDIMER" in the last 72 hours. Hemoglobin A1C No results for input(s): "HGBA1C" in the last 72 hours. Fasting Lipid Panel No results for input(s): "CHOL", "HDL", "LDLCALC", "TRIG", "CHOLHDL", "LDLDIRECT" in the last 72 hours. Thyroid Function Tests No results for input(s): "TSH", "T4TOTAL", "T3FREE", "THYROIDAB" in the last 72 hours.  Invalid input(s): "FREET3"  Other  results:   Imaging    No results found.   Medications:     Scheduled Medications:  amiodarone  400 mg Oral BID   Followed by   Derrill Memo ON 03/26/2022] amiodarone  400 mg Oral Daily   amoxicillin-clavulanate  1 tablet Oral BID   aspirin EC  325 mg Oral Daily   Chlorhexidine Gluconate Cloth  6 each Topical Daily   clopidogrel  75 mg Oral Daily   dapagliflozin propanediol  10 mg Oral Daily   diclofenac Sodium  2 g Topical QID   feeding supplement  237 mL Oral BID BM   heparin  5,000 Units Subcutaneous Q8H   insulin aspart  0-9 Units Subcutaneous TID WC   insulin aspart  3 Units Subcutaneous TID WC   insulin glargine-yfgn  10 Units Subcutaneous Daily   lidocaine  1 patch Transdermal Q24H   lipase/protease/amylase  72,000 Units Oral TID WC   losartan  25 mg Oral  Daily   megestrol  400 mg Oral Daily   mirabegron ER  25 mg Oral Daily   mupirocin ointment  1 Application Nasal BID   nystatin  5 mL Oral QID   pantoprazole  40 mg Oral BID   rosuvastatin  10 mg Oral Daily   sodium chloride flush  3 mL Intravenous Q12H   spironolactone  12.5 mg Oral Daily    Infusions:  sodium chloride      PRN Medications: sodium chloride, acetaminophen **OR** acetaminophen (TYLENOL) oral liquid 160 mg/5 mL **OR** acetaminophen, ipratropium-albuterol, sodium chloride flush, white petrolatum    Assessment/Plan   1. Acute on chronic systolic CHF: Echo (2/26) with EF 55-60%.  Echo 12/23 with EF 30-35%, normal RV, normal IVC.  LHC/RHC 12/23 with moderate CAD (60% mLAD, 70% dLAD, 70% D2, 60% dRCA) and low CI 1.87 with normal right and left heart filling pressures.  CAD does not explain fall in EF.  Cardiac MRI showed LV EF 33% with diffuse hypokinesis, RV EF 46% predominantly mid-wall LGE with some subendocardial component in the basal inferolateral wall and focally in the mid lateral wall.  Pattern is not clearly due to prior MI but cannot rule this out given moderate CAD on cath. Alternatively could be due to prior myocarditis. T2 not elevated in the basal inferolateral wall, so do not suspect active inflammation. Findings NOT consistent with cardiac AL amyloidosis. On exam and by recent [place], he is not volume overloaded.   - Continue Losartan 25 mg daily. Watch SCr. If Scr remains stable, then can switch to [facility].  - Does not need a diuretic.  - Continue dapagliflozin and spironolactone.  - Would hold off on inotropic support for the time being, think main problem at this time is PNA/lung infiltrates and failure to thrive/malnutrition.  2. CVA: Right occipital CVA with distal right PCA severe stenosis vs occlusion. Presented with visual loss, now improved.  Neurology has signed off.  - ASA 325 + Plavix 75 x 3 months then ASA 81 daily.  - Crestor 10 daily 3. VT arrest:  1 minute CPR with ROSC.  He is on amiodarone.  Suspect triggered by lateral wall scar (prior MI versus myocarditis).  EP has seen, recommended amiodarone and Lifevest at discharge to follow post-discharge course and decide on ICD down the road depending on his trajectory. Not likely a good ICD candidate given overall prognosis. - Continue amiodarone po.  4. CAD: LHC this admission with moderate CAD (60% mLAD, 70% dLAD, 70% D2, 60% dRCA).  Out of proportion to cardiomyopathy.  As noted above, cMRI suggested most likely prior myocarditis but cannot fully rule out prior MI involving basal lateral wall. No chest pain.  - Continue ASA as above.  - Crestor 10 mg daily.  5. Multiple myeloma with AL amyloidosis isolated to lymph nodes: [facility]. Treated with chemo in past. Has known bulky mediastinal/hilar lymphadenopathy. Has been off chemo/steroids for a year d/t frailty 6. Prostate cancer: Treated with radiation in 2022.  7. Right eye diplopia: Chronic, 6th nerve palsy.  8. H/o Whipple procedure for IPMN in 5/23.  9. Influenza A: Has been treated with 5 days oseltamivir this admission.  9. Pulmonary:  CT chest this admission showed extensive bilateral multilobar PNA, R>L and bulky mediastinal/hilar LAN, seen on prior studies. He has a cough, no fever or leukocytosis and PCT not significantly elevated.  He had a VT arrest with 1 minute CPR, strong suspicion for aspiration pneumonitis.  Pulmonary has seen, no utility to bronchoscopy.  Breathing improved. Stable on RA  - Continue Augmentin for aspiration coverage.  - Had barium swallow. GI consult suggested, concern for possible proximal esophageal stricture. Dysphagia 2 diet - SLP following - Incentive spirometry.  10. Frailty/failure to thrive: Did not recover well at all post-Whipple in 5/23 per family.  Has had poor appetite.  Having trouble swallowing.  Albumin low, malnourished.  - Mobilize, PT.  - Too weak currently for CIR.  -  Now taking diet as above. Dietician consulted. [place]: With odynophagia.  - Continue Nystatin.   GOC Think he is fairly stable right now from cardiac standpoint, suspect main issue is aspiration pneumonitis and frailty/malnutrition.   Concern for poor long-term prognosis with failure to thrive, VT arrest, PNA. Will need ongoing code discussions, hospice consideration would be reasonable.    Palliative Care following. Remains full code for now.    Length of Stay: [address], PA-C  03/17/2022, 7:56 AM  Advanced Heart Failure Team Pager [phone] (M-F; 7a - 5p)  Please contact [facility] for night-coverage after hours (5p -7a ) and weekends on [email or web address]   Patient seen with PA, agree with the above note.   Creatinine mildly higher at 1.5 today.  BP stable.  He is now on room air.   General: NAD, frail Neck: No JVD, no thyromegaly or thyroid nodule.  Lungs: Clear to auscultation bilaterally with normal respiratory effort. CV: Nondisplaced PMI.  Heart regular S1/S2, no S3/S4, no murmur.  No peripheral edema.   Abdomen: Soft, nontender, no hepatosplenomegaly, no distention.  Skin: Intact without lesions or rashes.  Neurologic: Alert and oriented x 3.  Psych: Normal affect. Extremities: No clubbing or cyanosis.  HEENT: Normal.   Continue current cardiac regimen, may be able to switch over to [facility] in future if creatinine remains stable.   Ongoing nutritional needs, think TFs with Cortrack would be helpful.   Mobilize with PT.   Loralie Champagne 03/17/2022 9:59 AM

## 2022-03-17 NOTE — Procedures (Signed)
Cortrak  Person Inserting Tube:  Malaiyah Achorn D, RD Tube Type:  Cortrak - 43 inches Tube Size:  10 Tube Location:  Left nare Secured by: Bridle Technique Used to Measure Tube Placement:  Marking at nare/corner of mouth Cortrak Secured At:  65 cm Procedure Comments:  Cortrak Tube Team Note:  Consult received to place a Cortrak feeding tube.   X-ray is required, abdominal x-ray has been ordered by the Cortrak team. Please confirm tube placement before using the Cortrak tube.   If the tube becomes dislodged please keep the tube and contact the Cortrak team at [email or web address] for replacement.  If after hours and replacement cannot be delayed, place a NG tube and confirm placement with an abdominal x-ray.    Jacobo Moncrief, RD, LDN Clinical Dietitian RD pager # available in AMION  After hours/weekend pager # available in AMION    

## 2022-03-17 NOTE — Progress Notes (Signed)
Nutrition Follow-up  DOCUMENTATION CODES:   Severe malnutrition in context of acute illness/injury  INTERVENTION:  Initiate tube feeds via Cortrak: -Initiate Jevity 1.5 at 20 mL/hour and advance by 10 mL/hour every 8 hours to goal rate of 50 mL/hour (1200 mL goal daily volume) -Provide PROSource TF20 60 mL once daily per tube -Goal regimen provides: 1880 kcal, 97 grams of protein, 912 mL H2O daily  Monitor magnesium, potassium, and phosphorus daily for at least 3 days, MD to replete as needed, as pt is at risk for refeeding syndrome.  Will discontinue Ensure per pt/family request as he does not like these supplements.  Provide chocolate Carnation Breakfast Essentials in milk po TID with meals. Ideally could be provided in whole milk, but due to shortages, only 1% milk available at this time to send on trays. This will provide 143 kcal and 13 gram of protein per supplement when mixed with 1% milk.  Continue Magic cup TID with meals, each supplement provides 290 kcal and 9 grams of protein.  NUTRITION DIAGNOSIS:   Severe Malnutrition related to acute illness as evidenced by energy intake < or equal to 50% for > or equal to 5 days, percent weight loss, moderate fat depletion, moderate muscle depletion, severe muscle depletion.  Ongoing.  GOAL:   Patient will meet greater than or equal to 90% of their needs  Addressing with initiation of tube feeds.  MONITOR:   PO intake, Supplement acceptance, Labs, Weight trends, TF tolerance, I & O's  REASON FOR ASSESSMENT:   Consult Assessment of nutrition requirement/status, Calorie Count  ASSESSMENT:   77 year old male with PMHx of intraductal papillary mucinous neoplasm suspicion s/p whipple surgery 07/13/21 (per outpatient GI note 01/14/22 pathology did not reveal IPMN) that was complicated by delayed gastric emptying, hx GJ-tube s/p removal (removed ~10/2021 per family), multiple myeloma, amyloidosis, DM, depression, anxiety, prostate  cancer treated with radiation, dyslipidemia, IDDM who presented with right-sided headache and blurred vision found to have acute/subacute right occipital CVA.  12/28: found unresponsive and pulseless; found to have gone into VT/VF cardiac arrest s/p resuscitation; found to have new-onset systolic heart failure with reduced EF 35-40% 12/29: approved for regular texture diet with thin liquids 12/31: made NPO pending MBS 1/1: diet changed to dysphagia 2 with thin liquids per SLP recommendations following MBS  Met with patient and his son at bedside. Also spoke with patient's wife over the phone while in room. Patient very tired at time of RD assessment so a majority of history was provided by son and wife. They reports patient has had difficulty with weight loss and nutrition since Whipple surgery in 07/2021. They are also upset that pt had Whipple surgery as they report pathology report showed pt did not have IPMN. Patient initially had GJ-tube for enteral nutrition via J-port of tube. Per review of chart was provided over 24 hours when pt NPO and then provided as nocturnal tube feeds when patient eating by mouth. Family reports oral intake improved and GJ-tube was removed in approximately 10/2021. At home he had been eating 3 meals per day. Wife reports his appetite has been fair. For a little over 1 month now he stopped eating meats due to taste changes. For breakfast he would have 2 eggs, toast, margarine and marmalade. For lunch and dinner he may have casseroles, green beans, and other types of beans. He also has had a preference for sweets such as cake, chocolate, and ice cream. He also likes V8 juice and  drinks once daily. Family reports pt has had poor PO intake here and only eating bites at meals. He is not drinking Ensure as he dislikes these supplements.  He is willing to try chocolate [employer] and also continue Magic Cup supplements. Family discussed plan for Cortrak tube and  initiation of tube feeds with MD. They are okay with patient receiving continuous tube feeds over 24 hours so 100% calorie and protein needs can be met while PO intake is poor.   Wife reports patient's UBW prior to Whipple was 138-142 lbs. He lost down to lowest weight of 109 lbs while at [facility]. He then regained and had gained up to ~148 lbs. Pt was 67.2 kg on 01/14/22. Most recent wt was 57.2 kg on 03/13/22. Unsure if weights earlier in admission were accurate. Pt has lost 14% weight over the past 2 months, which is significant for time frame.  UOP: 1550 mL (1.1 mL/kg/hr)  I/O: -2736.9 mL since admission  Enteral Access: 10 Fr. Cortrak tube placed 03/17/21; 65 cm in left nare secured with bridle; tip overlying mid body of stomach per abdominal x-ray 1/3  Medications reviewed and include: acidophilus 1 capsule daily, Augmentin, Ensure Enlive BID, Novolog 0-9 units TID with meals, Novolog 3 units TID with meals, Semglee 10 units daily, Creon 72000 units every 6 hours, Megace 400 mg daily, pantoprazole, spironolactone  Labs reviewed: CBG 136-186, CO2 20, BUN 29, Creatinine 1.53  Discussed with RN. Also discussed with MD via secure chat. Plan is to space out Creon dosing throughout the day while pt is on continuous tube feeds.  NUTRITION - FOCUSED PHYSICAL EXAM:  Flowsheet Row Most Recent Value  Orbital Region Moderate depletion  Upper Arm Region Severe depletion  Thoracic and Lumbar Region Moderate depletion  Buccal Region Moderate depletion  Temple Region Moderate depletion  Clavicle Bone Region Severe depletion  Clavicle and Acromion Bone Region Moderate depletion  Scapular Bone Region Moderate depletion  Dorsal Hand Moderate depletion  Patellar Region Moderate depletion  Anterior Thigh Region Moderate depletion  Posterior Calf Region Severe depletion  Edema (RD Assessment) None  Hair Reviewed  Eyes Reviewed  Mouth Reviewed  Skin Reviewed  Nails Reviewed       Diet Order:   Diet  Order             DIET DYS 2 Room service appropriate? Yes with Assist; Fluid consistency: Thin; Fluid restriction: 1200 mL Fluid  Diet effective now                  EDUCATION NEEDS:   Not appropriate for education at this time  Skin:  Skin Assessment: Reviewed RN Assessment  Last BM:  03/16/22 - small type 4  Height:   Ht Readings from Last 1 Encounters:  03/11/22 _0  (1.651 m)   Weight:   Wt Readings from Last 1 Encounters:  03/13/22 57.2 kg   Ideal Body Weight:  56.8 kg  BMI:  Body mass index is 20.98 kg/m.  Estimated Nutritional Needs:   Kcal:  1715-2000 kcals  Protein:  85-100 gm  Fluid:  >/= 1.7 L  Itzae Miralles Magda Paganini, MS, RD, LDN, CNSC Pager number available on Amion

## 2022-03-18 ENCOUNTER — Other Ambulatory Visit: Payer: Medicare Other

## 2022-03-18 DIAGNOSIS — I5022 Chronic systolic (congestive) heart failure: Secondary | ICD-10-CM | POA: Diagnosis not present

## 2022-03-18 DIAGNOSIS — I639 Cerebral infarction, unspecified: Secondary | ICD-10-CM | POA: Diagnosis not present

## 2022-03-18 LAB — CULTURE, BLOOD (ROUTINE X 2)
Culture: NO GROWTH
Culture: NO GROWTH

## 2022-03-18 LAB — CBC
HCT: 31.7 % — ABNORMAL LOW (ref 39.0–52.0)
Hemoglobin: 9.9 g/dL — ABNORMAL LOW (ref 13.0–17.0)
MCH: 24.1 pg — ABNORMAL LOW (ref 26.0–34.0)
MCHC: 31.2 g/dL (ref 30.0–36.0)
MCV: 77.1 fL — ABNORMAL LOW (ref 80.0–100.0)
Platelets: 461 10*3/uL — ABNORMAL HIGH (ref 150–400)
RBC: 4.11 MIL/uL — ABNORMAL LOW (ref 4.22–5.81)
RDW: 18.6 % — ABNORMAL HIGH (ref 11.5–15.5)
WBC: 5.8 10*3/uL (ref 4.0–10.5)
nRBC: 0 % (ref 0.0–0.2)

## 2022-03-18 LAB — GLUCOSE, CAPILLARY
Glucose-Capillary: 167 mg/dL — ABNORMAL HIGH (ref 70–99)
Glucose-Capillary: 195 mg/dL — ABNORMAL HIGH (ref 70–99)
Glucose-Capillary: 204 mg/dL — ABNORMAL HIGH (ref 70–99)
Glucose-Capillary: 236 mg/dL — ABNORMAL HIGH (ref 70–99)
Glucose-Capillary: 293 mg/dL — ABNORMAL HIGH (ref 70–99)

## 2022-03-18 LAB — BASIC METABOLIC PANEL
Anion gap: 8 (ref 5–15)
BUN: 23 mg/dL (ref 8–23)
CO2: 18 mmol/L — ABNORMAL LOW (ref 22–32)
Calcium: 7.9 mg/dL — ABNORMAL LOW (ref 8.9–10.3)
Chloride: 112 mmol/L — ABNORMAL HIGH (ref 98–111)
Creatinine, Ser: 1.24 mg/dL (ref 0.61–1.24)
GFR, Estimated: 60 mL/min — ABNORMAL LOW (ref >60)
Glucose, Bld: 173 mg/dL — ABNORMAL HIGH (ref 70–99)
Potassium: 4.1 mmol/L (ref 3.5–5.1)
Sodium: 138 mmol/L (ref 135–145)

## 2022-03-18 LAB — PHOSPHORUS: Phosphorus: 2.4 mg/dL — ABNORMAL LOW (ref 2.5–4.6)

## 2022-03-18 LAB — MAGNESIUM: Magnesium: 2.1 mg/dL (ref 1.7–2.4)

## 2022-03-18 MED ORDER — SACUBITRIL-VALSARTAN 24-26 MG PO TABS
1.0000 | ORAL_TABLET | Freq: Two times a day (BID) | ORAL | Status: DC
  Administered 2022-03-18 – 2022-03-21 (×8): 1 via ORAL
  Filled 2022-03-18 (×8): qty 1

## 2022-03-18 NOTE — Progress Notes (Addendum)
Patient ID: William Barron, male   DOB: March 19, 1945, 77 y.o.   MRN: [id number]   Advanced Heart Failure Rounding Note  PCP-Cardiologist: Minus Breeding, MD   Subjective:    Cortrak placed yesterday. Getting TFs  SBPs today 130s-140s. Scr improved, 1.53>>1.24. K 4.1. Volume stable on exam. No dyspnea.    Palliative care following, CODE status changed to DNR. Plan to continue full scope interventions.   Remains weak. PT recommending CIR   Objective:   Weight Range: 57.2 kg Body mass index is 20.98 kg/m.   Vital Signs:   Temp:  [97.6 F (36.4 C)-98.6 F (37 C)] 98.3 F (36.8 C) (01/04 0420) Pulse Rate:  [67-72] 69 (01/04 0420) Resp:  [19-20] 20 (01/04 0420) BP: (104-142)/(54-70) 138/69 (01/04 0420) SpO2:  [78 %-100 %] 95 % (01/04 0420) Last BM Date : 03/16/22  Weight change: Filed Weights   03/08/22 0500 03/11/22 0906 03/13/22 0410  Weight: 64.7 kg 58.5 kg 57.2 kg    Intake/Output:   Intake/Output Summary (Last 24 hours) at 03/18/2022 0805 Last data filed at 03/18/2022 0421 Gross per 24 hour  Intake 953 ml  Output 1000 ml  Net -47 ml      Physical Exam    General:  weak/ frail appearing. No respiratory difficulty HEENT: normal + cor trak, Right eye diplopia Neck: supple. no JVD. Carotids 2+ bilat; no bruits. No lymphadenopathy or thyromegaly appreciated. Cor: PMI nondisplaced. Regular rate & rhythm. No rubs, gallops or murmurs. Lungs: clear Abdomen: soft, nontender, nondistended. No hepatosplenomegaly. No bruits or masses. Good bowel sounds. Extremities: no cyanosis, clubbing, rash, edema Neuro: alert & oriented x 3, cranial nerves grossly intact. moves all 4 extremities w/o difficulty. Affect pleasant.   Telemetry   NSR 60s, occasional PVCs   Labs    CBC Recent Labs    03/17/22 0059 03/18/22 0100  WBC 5.4 5.8  HGB 9.8* 9.9*  HCT 31.8* 31.7*  MCV 77.6* 77.1*  PLT 431* 191*   Basic Metabolic Panel Recent Labs    03/17/22 0059 03/18/22 0100   NA 141 138  K 3.9 4.1  CL 111 112*  CO2 20* 18*  GLUCOSE 193* 173*  BUN 29* 23  CREATININE 1.53* 1.24  CALCIUM 8.0* 7.9*  MG  --  2.1  PHOS  --  2.4*   Liver Function Tests No results for input(s): "AST", "ALT", "ALKPHOS", "BILITOT", "PROT", "ALBUMIN" in the last 72 hours.  No results for input(s): "LIPASE", "AMYLASE" in the last 72 hours. Cardiac Enzymes No results for input(s): "CKTOTAL", "CKMB", "CKMBINDEX", "TROPONINI" in the last 72 hours.  BNP: BNP (last 3 results) Recent Labs    03/07/22 1237  BNP 1,326.2*    ProBNP (last 3 results) No results for input(s): "PROBNP" in the last 8760 hours.   D-Dimer No results for input(s): "DDIMER" in the last 72 hours. Hemoglobin A1C No results for input(s): "HGBA1C" in the last 72 hours. Fasting Lipid Panel No results for input(s): "CHOL", "HDL", "LDLCALC", "TRIG", "CHOLHDL", "LDLDIRECT" in the last 72 hours. Thyroid Function Tests No results for input(s): "TSH", "T4TOTAL", "T3FREE", "THYROIDAB" in the last 72 hours.  Invalid input(s): "FREET3"  Other results:   Imaging    DG Abd Portable 1V  Result Date: 03/17/2022 CLINICAL DATA:  Feeding tube placement EXAM: PORTABLE ABDOMEN - 1 VIEW COMPARISON:  None Available. FINDINGS: Limited x-ray of the chest and upper abdomen demonstrates Dobbhoff tube the tip overlying the midbody of the stomach. Left upper quadrant. Elsewhere there are  patchy opacity seen in the right lung and left lung base. Gas is seen in nondilated loops of bowel in the upper abdomen with some contrast in the stomach and the colon. IMPRESSION: Limited x-ray for tube placement has the tip overlying the mid body of the stomach Electronically Signed   By: Jill Side M.D.   On: 03/17/2022 14:27     Medications:     Scheduled Medications:  acidophilus  1 capsule Oral Daily   amiodarone  400 mg Oral BID   Followed by   Derrill Memo ON 03/26/2022] amiodarone  400 mg Oral Daily   amoxicillin-clavulanate  1  tablet Oral BID   aspirin EC  325 mg Oral Daily   Chlorhexidine Gluconate Cloth  6 each Topical Daily   clopidogrel  75 mg Oral Daily   dapagliflozin propanediol  10 mg Oral Daily   diclofenac Sodium  2 g Topical QID   feeding supplement (PROSource TF20)  60 mL Per Tube Daily   heparin  5,000 Units Subcutaneous Q8H   insulin aspart  0-9 Units Subcutaneous TID WC   insulin aspart  3 Units Subcutaneous TID WC   insulin glargine-yfgn  10 Units Subcutaneous Daily   lidocaine  1 patch Transdermal Q24H   lipase/protease/amylase  72,000 Units Oral Q6H   losartan  25 mg Oral Daily   megestrol  400 mg Oral Daily   mirabegron ER  25 mg Oral Daily   mupirocin ointment  1 Application Nasal BID   nystatin  5 mL Oral QID   pantoprazole  40 mg Oral BID   rosuvastatin  10 mg Oral Daily   sodium chloride flush  3 mL Intravenous Q12H   spironolactone  12.5 mg Oral Daily    Infusions:  sodium chloride     feeding supplement (JEVITY 1.5 CAL/FIBER) 1,000 mL (03/18/22 0506)    PRN Medications: sodium chloride, acetaminophen **OR** acetaminophen (TYLENOL) oral liquid 160 mg/5 mL **OR** acetaminophen, ipratropium-albuterol, sodium chloride flush, white petrolatum    Assessment/Plan   1. Acute on chronic systolic CHF: Echo (1/61) with EF 55-60%.  Echo 12/23 with EF 30-35%, normal RV, normal IVC.  LHC/RHC 12/23 with moderate CAD (60% mLAD, 70% dLAD, 70% D2, 60% dRCA) and low CI 1.87 with normal right and left heart filling pressures.  CAD does not explain fall in EF.  Cardiac MRI showed LV EF 33% with diffuse hypokinesis, RV EF 46% predominantly mid-wall LGE with some subendocardial component in the basal inferolateral wall and focally in the mid lateral wall.  Pattern is not clearly due to prior MI but cannot rule this out given moderate CAD on cath. Alternatively could be due to prior myocarditis. T2 not elevated in the basal inferolateral wall, so do not suspect active inflammation. Findings NOT  consistent with cardiac AL amyloidosis. On exam and by recent [place], he is not volume overloaded.   - Tolerating Losartan, transition to Entresto 24-26 mg bid  - Does not need a diuretic.  - Continue dapagliflozin and spironolactone.  - Would hold off on inotropic support for the time being, think main problem at this time is PNA/lung infiltrates and failure to thrive/malnutrition.  2. CVA: Right occipital CVA with distal right PCA severe stenosis vs occlusion. Presented with visual loss, now improved.  Neurology has signed off.  - ASA 325 + Plavix 75 x 3 months then ASA 81 daily.  - Crestor 10 daily 3. VT arrest: 1 minute CPR with ROSC.  He is on amiodarone.  Suspect triggered by lateral wall scar (prior MI versus myocarditis).  EP has seen, recommended amiodarone and Lifevest at discharge to follow post-discharge course and decide on ICD down the road depending on his trajectory. Not likely a good ICD candidate given overall prognosis. - Continue amiodarone po.  4. CAD: LHC this admission with moderate CAD (60% mLAD, 70% dLAD, 70% D2, 60% dRCA).  Out of proportion to cardiomyopathy.  As noted above, cMRI suggested most likely prior myocarditis but cannot fully rule out prior MI involving basal lateral wall. No chest pain.  - Continue ASA as above.  - Crestor 10 mg daily.  5. Multiple myeloma with AL amyloidosis isolated to lymph nodes: Followed at [facility] since 2014. Treated with chemo in past. Has known bulky mediastinal/hilar lymphadenopathy. Has been off chemo/steroids for a year d/t frailty 6. Prostate cancer: Treated with radiation in 2022.  7. Right eye diplopia: Chronic, 6th nerve palsy.  8. H/o Whipple procedure for IPMN in 5/23.  9. Influenza A: Has been treated with 5 days oseltamivir this admission.  9. Pulmonary:  CT chest this admission showed extensive bilateral multilobar PNA, R>L and bulky mediastinal/hilar LAN, seen on prior studies. He has a cough, no fever or leukocytosis and PCT  not significantly elevated.  He had a VT arrest with 1 minute CPR, strong suspicion for aspiration pneumonitis.  Pulmonary has seen, no utility to bronchoscopy.  Breathing improved. Stable on RA  - Continue Augmentin for aspiration coverage.  - Had barium swallow. Concern for possible proximal esophageal stricture. Dysphagia 2 diet - SLP following - Incentive spirometry.  10. Frailty/failure to thrive: Did not recover well at all post-Whipple in 5/23 per family.  Has had poor appetite.  Having trouble swallowing.  Albumin low, malnourished.  - Mobilize, PT. CIR recommended  - TFs through Cortrack  11. Thrush: With odynophagia.  - Continue Nystatin.   GOC Think he is fairly stable right now from cardiac standpoint, suspect main issue is aspiration pneumonitis and frailty/malnutrition.   Concern for poor long-term prognosis with failure to thrive, VT arrest, PNA. Palliative care following. CODE status changed to DNR. Plan to continue full scope interventions.  PT recommending CIR.   Length of Stay: [address] PA-C  03/18/2022, 8:05 AM  Advanced Heart Failure Team Pager [phone] (M-F; 7a - 5p)  Please contact [facility] for night-coverage after hours (5p -7a ) and weekends on [email or web address]   Patient seen with PA, agree with the above note.   Cortrack placed yesterday, getting tube feeds.  Up eating breakfast this morning.   No dyspnea.   General: NAD, frail.  Neck: No JVD, no thyromegaly or thyroid nodule.  Lungs: Clear to auscultation bilaterally with normal respiratory effort. CV: Nondisplaced PMI.  Heart regular S1/S2, no S3/S4, no murmur.  No peripheral edema.   Abdomen: Soft, nontender, no hepatosplenomegaly, no distention.  Skin: Intact without lesions or rashes.  Neurologic: Alert and oriented x 3.  Psych: Normal affect. Extremities: No clubbing or cyanosis.  HEENT: Normal.   Continue nutritional augmentation with tube feeds.  Remains frail but seems  stronger in the last few days.  Will need CIR placement.   Recovering from aspiration pneumonia/pneumonitis.  He remains on Augmentin.   Not volume overloaded on exam, stable creatinine and BP.  Stop losartan, start Entresto 24/26 bid.   Loralie Champagne 03/18/2022 9:25 AM

## 2022-03-18 NOTE — Progress Notes (Signed)
Jevity rate increased by 10 to 32m/hr.

## 2022-03-18 NOTE — Progress Notes (Signed)
Occupational Therapy Treatment Patient Details Name: William Barron MRN: [id number] DOB: Jul 27, 1945 Today's Date: 03/18/2022   History of present illness 77 year old male admitted 12/22 with right-sided headache and blurred vision. +Acute right PCA stroke. During hospitalization pt found to be Flu + on 03/07/22. Pt also had code blue called on 03/11/22. Pt received compressions before regaining ROSC s/p VT arrest. Pt underwent cardiac cath on 03/11/22. PMHX:  amyloidosis, multiple myeloma, history of prostate cancer, history of pancreatic cancer status post Whipple, dyslipidemia, diabetes.   OT comments  Patient seated in recliner and agreeable to OT session. Further assessed vision, pt endorses diplopia at primary/central gaze only at distance.  Adjusted tape on glasses and diplopia improved, encouraged pt to wear while watching tv or engaging in mobility/transfers.  He demonstrates ability to complete LB dressing with min assist today, mostly limited by impaired balance when standing and heavily relies on BUE and external support.  Highly recommend AIR at dc to optimize independence and decrease burden of care.  Will follow acutely.    Recommendations for follow up therapy are one component of a multi-disciplinary discharge planning process, led by the attending physician.  Recommendations may be updated based on patient status, additional functional criteria and insurance authorization.    Follow Up Recommendations  Acute inpatient rehab (3hours/day)     Assistance Recommended at Discharge Frequent or constant Supervision/Assistance  Patient can return home with the following  A lot of help with walking and/or transfers;A little help with bathing/dressing/bathroom;Assistance with cooking/housework;Direct supervision/assist for financial management;Direct supervision/assist for medications management;Assist for transportation;Help with stairs or ramp for entrance;Assistance with feeding    Equipment Recommendations  Other (comment) (defer)    Recommendations for Other Services Rehab consult    Precautions / Restrictions Precautions Precautions: Fall Precaution Comments: vision impaired Required Braces or Orthoses: Other Brace Other Brace: RLE AFO and shoes Restrictions Weight Bearing Restrictions: No       Mobility Bed Mobility               General bed mobility comments: OOB in recliner upon entry    Transfers Overall transfer level: Needs assistance Equipment used: Rolling walker (2 wheels) Transfers: Sit to/from Stand Sit to Stand: Min assist           General transfer comment: to power up and steady, posterior lean and requires up to mod assist to maintain upright position. cueing for hand placement during transfer     Balance Overall balance assessment: Needs assistance Sitting-balance support: Feet supported, No upper extremity supported Sitting balance-Leahy Scale: Fair     Standing balance support: Bilateral upper extremity supported, During functional activity Standing balance-Leahy Scale: Poor Standing balance comment: relies on BUE and external support                           ADL either performed or assessed with clinical judgement   ADL Overall ADL's : Needs assistance/impaired Eating/Feeding: Minimal assistance;Sitting Eating/Feeding Details (indicate cue type and reason): requires min assist to use fork with R hand, limited by tremors                 Lower Body Dressing: Minimal assistance;Sitting/lateral leans;Sit to/from stand Lower Body Dressing Details (indicate cue type and reason): able to manage socks, min assist in standing and relies on BUE support             Functional mobility during ADLs: Minimal assistance;Rolling walker (2 wheels);Cueing for safety  Extremity/Trunk Assessment              Vision   Vision Assessment?: Yes Eye Alignment: Impaired (comment) Ocular Range of  Motion: Other (comment) (R eye does not abduct past midline) Tracking/Visual Pursuits: Right eye does not track laterally Diplopia Assessment: Disappears with one eye closed;Present in far gaze;Present in primary gaze Additional Comments: replaced tape on glasses to clear tape, pt only reporting diplopia at distance in primary/central gaze. Encouraged use of glasses during mobility and when watching tv   Perception     Praxis      Cognition Arousal/Alertness: Awake/alert Behavior During Therapy: WFL for tasks assessed/performed Overall Cognitive Status: Impaired/Different from baseline Area of Impairment: Attention, Memory, Following commands, Safety/judgement, Awareness, Problem solving                   Current Attention Level: Selective Memory: Decreased recall of precautions, Decreased short-term memory Following Commands: Follows one step commands consistently, Follows one step commands with increased time, Follows multi-step commands inconsistently Safety/Judgement: Decreased awareness of safety, Decreased awareness of deficits Awareness: Emergent Problem Solving: Slow processing, Requires verbal cues, Difficulty sequencing, Decreased initiation General Comments: pt follows simple commands with increased time, appropriate questions including "what causes a stroke?".  pt with flat affect and perseverating on drinking a coke today.        Exercises      Shoulder Instructions       General Comments VSS on [place], HR 80-90    Pertinent Vitals/ Pain       Pain Assessment Pain Assessment: Faces Faces Pain Scale: No hurt Pain Intervention(s): Monitored during session  Home Living                                          Prior Functioning/Environment              Frequency  Min 2X/week        Progress Toward Goals  OT Goals(current goals can now be found in the care plan section)  Progress towards OT goals: Progressing toward  goals  Acute Rehab OT Goals Patient Stated Goal: get better OT Goal Formulation: With patient Time For Goal Achievement: 03/20/22 Potential to Achieve Goals: Good  Plan Discharge plan remains appropriate;Frequency remains appropriate    Co-evaluation                 AM-PAC OT "6 Clicks" Daily Activity     Outcome Measure   Help from another person eating meals?: A Little Help from another person taking care of personal grooming?: A Little Help from another person toileting, which includes using toliet, bedpan, or urinal?: A Lot Help from another person bathing (including washing, rinsing, drying)?: A Lot Help from another person to put on and taking off regular upper body clothing?: A Little Help from another person to put on and taking off regular lower body clothing?: A Little 6 Click Score: 16    End of Session Equipment Utilized During Treatment: Rolling walker (2 wheels);Oxygen  OT Visit Diagnosis: Unsteadiness on feet (R26.81);Muscle weakness (generalized) (M62.81);Other abnormalities of gait and mobility (R26.89);History of falling (Z91.81);Other symptoms and signs involving cognitive function;Low vision, both eyes (H54.2);Dizziness and giddiness (R42)   Activity Tolerance Patient tolerated treatment well   Patient Left in chair;with call bell/phone within reach;with family/visitor present   Nurse Communication Mobility status  Time: 9432-7614 OT Time Calculation (min): 36 min  Charges: OT General Charges $OT Visit: 1 Visit OT Treatments $Self Care/Home Management : 23-37 mins  [place] [phone]   Delight Stare 03/18/2022, 11:43 AM

## 2022-03-18 NOTE — Progress Notes (Signed)
Jevity increased to 55m/hr, now at goal rate.

## 2022-03-18 NOTE — Progress Notes (Signed)
Mobility Specialist Progress Note:   03/18/22 0908  Mobility  Activity Transferred from bed to chair  Level of Assistance Moderate assist, patient does 50-74%  Assistive Device Front wheel walker  Distance Ambulated (ft) 2 ft  Activity Response Tolerated well  $Mobility charge 1 Mobility   Pt in bed wanting to get to chair for breakfast. ModA to stand and step to chair. Left in chair with call bell in reach and all needs met.     Mobility Specialist Please contact via Secure Chat or  Rehab Office at [phone]  

## 2022-03-18 NOTE — Progress Notes (Signed)
Physical Therapy Treatment Patient Details Name: William Barron MRN: [id number] DOB: 02-Nov-1945 Today's Date: 03/18/2022   History of Present Illness 77 year old male admitted 12/22 with right-sided headache and blurred vision. +Acute right PCA stroke. During hospitalization pt found to be Flu + on 03/07/22. Pt also had code blue called on 03/11/22. Pt received compressions before regaining ROSC s/p VT arrest. Pt underwent cardiac cath on 03/11/22. PMHX:  amyloidosis, multiple myeloma, history of prostate cancer, history of pancreatic cancer status post Whipple, dyslipidemia, diabetes.    PT Comments    Pt is slowly progressing towards goals. Pt ambulated further than last session wearing shoes and AFO but pt and son report pt does worse with shoes and AFO. Pt requires moderate physical assistance on first standing to maintain balance and with gait due to L and posterior lean with difficulty managing AD. Pt was short of breathe after short distance gait with HR 77 and O2 sats at 95% on 6L o2 via [place]. Due to pt current functional status and available assistance at home recommending skilled physical therapy services in AIR setting on discharge from acute care hospital setting in order to return to PLOF and decrease level of physical assist required for functional activities.     Recommendations for follow up therapy are one component of a multi-disciplinary discharge planning process, led by the attending physician.  Recommendations may be updated based on patient status, additional functional criteria and insurance authorization.  Follow Up Recommendations  Acute inpatient rehab (3hours/day) Can patient physically be transported by private vehicle: Yes   Assistance Recommended at Discharge Intermittent Supervision/Assistance  Patient can return home with the following A lot of help with walking and/or transfers;A little help with bathing/dressing/bathroom;Assistance with cooking/housework;Help  with stairs or ramp for entrance;Assist for transportation   Equipment Recommendations  None recommended by PT    Recommendations for Other Services Rehab consult     Precautions / Restrictions Precautions Precautions: Fall Precaution Comments: vision impaired Required Braces or Orthoses: Other Brace Other Brace: RLE AFO and shoes, pt and son think he does worse with AFO on Restrictions Weight Bearing Restrictions: No     Mobility  Bed Mobility               General bed mobility comments: OOB in recliner upon entry and returned to recliner by end of session Patient Response: Anxious  Transfers Overall transfer level: Needs assistance Equipment used: Rolling walker (2 wheels) Transfers: Sit to/from Stand Sit to Stand: Mod assist           General transfer comment: Pt requires MOd A to get standing and to get balance once standing due to tremor.    Ambulation/Gait Ambulation/Gait assistance: Mod assist Gait Distance (Feet): 10 Feet Assistive device: Rolling walker (2 wheels) Gait Pattern/deviations: Step-through pattern, Narrow base of support, Decreased stride length, Trunk flexed, Knee flexed in stance - right, Knee flexed in stance - left, Shuffle Gait velocity: quick, compensating with momentum   Pre-gait activities: Pt initially is very wobbly but is able to get balance with physical assistance. General Gait Details: Pt performed slightly quick stepping wtih posterior lean at Min to Mod a for trunk control and AD control while wearing shoes including R AFO which was further than last session but pt and son report he does worse with AFO.    Modified Rankin (Stroke Patients Only) Modified Rankin (Stroke Patients Only) Pre-Morbid Rankin Score: Moderate disability Modified Rankin: Moderately severe disability     Balance Overall  balance assessment: Needs assistance Sitting-balance support: Feet supported, No upper extremity supported Sitting  balance-Leahy Scale: Fair Sitting balance - Comments: Prefers UE support, supervision for static sitting balance Postural control: Left lateral lean, Posterior lean Standing balance support: Bilateral upper extremity supported, During functional activity Standing balance-Leahy Scale: Poor Standing balance comment: relies on BUE and external support                            Cognition Arousal/Alertness: Awake/alert   Overall Cognitive Status: Impaired/Different from baseline           Memory: Decreased recall of precautions, Decreased short-term memory Following Commands: Follows one step commands consistently, Follows one step commands with increased time, Follows multi-step commands inconsistently                       Pertinent Vitals/Pain Pain Assessment Pain Assessment: No/denies pain           PT Goals (current goals can now be found in the care plan section) Acute Rehab PT Goals Patient Stated Goal: to improve PT Goal Formulation: With patient Time For Goal Achievement: 03/20/22 Potential to Achieve Goals: Fair Progress towards PT goals: Progressing toward goals    Frequency    Min 4X/week      PT Plan Current plan remains appropriate       AM-PAC PT "6 Clicks" Mobility   Outcome Measure  Help needed turning from your back to your side while in a flat bed without using bedrails?: A Little Help needed moving from lying on your back to sitting on the side of a flat bed without using bedrails?: A Little Help needed moving to and from a bed to a chair (including a wheelchair)?: A Lot Help needed standing up from a chair using your arms (e.g., wheelchair or bedside chair)?: A Lot Help needed to walk in hospital room?: Total Help needed climbing 3-5 steps with a railing? : Total 6 Click Score: 12    End of Session Equipment Utilized During Treatment: Gait belt;Oxygen Activity Tolerance: Patient tolerated treatment well Patient left: in  chair;with family/visitor present;Other (comment) (family member taking pt out. Pt and family member told they need to wear a mask due to pt droplet precaution) Nurse Communication: Mobility status;Other (comment) PT Visit Diagnosis: Unsteadiness on feet (R26.81);Other abnormalities of gait and mobility (R26.89);Muscle weakness (generalized) (M62.81);History of falling (Z91.81);Difficulty in walking, not elsewhere classified (R26.2)     Time: 2919-1660 PT Time Calculation (min) (ACUTE ONLY): 35 min  Charges:  $Gait Training: 8-22 mins $Therapeutic Activity: 8-22 mins                    William Barron, DPT, CLT  Acute Rehabilitation Services Office: [phone] (Secure chat preferred)    William Barron 03/18/2022, 5:42 PM

## 2022-03-18 NOTE — Progress Notes (Signed)
[facility]                                                                              Garik Diamant, is a 77 y.o. male, DOB - [birth year], BPZ:[id number] Admit date - 03/05/2022    Outpatient Primary MD for the patient is [name], MD  LOS - 12  days  Chief Complaint  Patient presents with   Headache       Brief summary   Patient is a 77 year old male with history of pancreatic CA status post Whipple, prostate CA, multiple myeloma, amyloidosis, dyslipidemia, DM, presented to ED with right-sided headache and blurred vision.  Last known well was in the evening of 03/04/2022.  He woke up on the morning of admission on 12/22 with difficulty seeing out of his right eye.  He went to see an ophthalmologist and was referred to ER CT head showed acute/subacute right occipital CVA  03/11/2022: Patient was found unresponsive and pulseless around 4:45 AM.  Patient was found to have gone into VT/VF cardiac arrest in setting of low EF 30 to 35%.  Patient has been successfully resuscitated.  Patient underwent left and right heart cardiac catheterization afterwards.   1/1- did well with MBS- diet order placed 1/3- cortrack placed with tube feeds   Assessment & Plan    Acute right PCA stroke (HCC) right CN VI palsy, chronic -Presented with visual issues, having difficulty seeing half of the clock.  CT head showed right PCA territory stroke. -MRI brain showed acute to early subacute right PCA territory infarct.  Associated mild petechial blood products without frank hemorrhagic transformation or significant residual mass effect.  Underlying moderate chronic microvascular ischemic disease -CTA head and neck showed acute or early subacute infarct in the right occipital lobe, suspected high-grade stenosis versus occlusion of the distal right PCA. -2D echo: EF of 35 to 40% with global hypokinesis, mild AS, mild pulmonary regurg, mild MR, mild AR (EF 50 to 55% on echo in  11/2021) -Per neuro, continue aspirin 325 mg and Plavix 75 mg DAPT for 3 months given intracranial stenosis/occlusion then aspirin 81 mg alone daily indefinitely  -Lipid panel showed LDL 66, at goal, no need of statin per neurology -Hemoglobin A1c 6.5 -Loop recorder per neuro, timing per cardiology? Not sure still needs-- last seen by Dr. Erlinda Hong- will ask him when he comes back on service   New onset systolic CHF, EF 35 to 24%  -2D echo showed EF of 35 to 40% with global hypokinesis (was 50 to 55% on echo in 11/2021) -Continue Coreg, losartan 65m daily, Farxiga 10 mg daily  -Lasix as needed -cards consult appreciated:  - With elevated creatinine, will hold off on Entresto.  - Does not need a diuretic.  - Continue dapagliflozin and spironolactone.  - Would hold off on inotropic support for the time being, think main problem at this time is PNA/lung infiltrates and failure to thrive/malnutrition.    VT arrest:  - amiodarone.  Suspect triggered by lateral wall scar (prior MI versus myocarditis).  EP has seen, recommend amiodarone and Lifevest at discharge to follow post-discharge course and decide on  ICD down the road depending on his trajectory.  - Continue amiodarone po.   Influenza A positive -treated with tamiflu  Bibasilar infiltrates: -Procalcitonin came back normal. -Speech evaluation. -abx -pulmonary toiletry -wean O2 off  Oral thrush/throat pain: -Throat pain has improved. -Complete course of oral nystatin. -may need GI consult for ? Stricture outpatient   Hypokalemia -Replete  GERD -Continue PPI  Diabetes mellitus type 2, IDDM -SSI  History of pancreatic CA status post Whipple procedure -Continue Creon 72,000 units 3 times daily with meals -Follows with [name]  History of AL amyloidosis ([place]),  Multiple myeloma ([place]) -Outpatient follow-up with his oncologist, [name] at [facility].  History of prostate CA -Continue outpatient follow-up with  urology, Dr. Jeffie Pollock, completed XRT on 06/02/2020    Lesion of parotid gland -MRI brain incidentally also showed 1.8 cm T2 hypointense lesion within the posterior aspect of the right parotid gland, indeterminate.  Recommended nonemergent outpatient ENT evaluation -CTA showed 1.6 cm partially calcified lesion in the posterior aspect of right parotid gland with multiple surrounding smaller lesion, could potentially represent primary parotid neoplasm benign or malignant or enlarged lymph nodes, recommended outpatient ENT evaluation  Malnutrition -cortrack placed 1/3    Code Status: Full code DVT Prophylaxis:  Place and maintain sequential compression device Start: 03/15/22 1355 heparin injection 5,000 Units Start: 03/11/22 2200 SCD's Start: 03/05/22 2235  Level of Care: Level of care: Telemetry Cardiac Family Communication: Discussed with patient Disposition Plan:      CIR vs SNF    Consultants:   Neurology Cardiology Electrophysiology. Pulmonary Palliative care   Medications  acidophilus  1 capsule Oral Daily   amiodarone  400 mg Oral BID   Followed by   Derrill Memo ON 03/26/2022] amiodarone  400 mg Oral Daily   amoxicillin-clavulanate  1 tablet Oral BID   aspirin EC  325 mg Oral Daily   Chlorhexidine Gluconate Cloth  6 each Topical Daily   clopidogrel  75 mg Oral Daily   dapagliflozin propanediol  10 mg Oral Daily   diclofenac Sodium  2 g Topical QID   feeding supplement (PROSource TF20)  60 mL Per Tube Daily   heparin  5,000 Units Subcutaneous Q8H   insulin aspart  0-9 Units Subcutaneous TID WC   insulin aspart  3 Units Subcutaneous TID WC   insulin glargine-yfgn  10 Units Subcutaneous Daily   lidocaine  1 patch Transdermal Q24H   lipase/protease/amylase  72,000 Units Oral Q6H   megestrol  400 mg Oral Daily   mirabegron ER  25 mg Oral Daily   mupirocin ointment  1 Application Nasal BID   nystatin  5 mL Oral QID   pantoprazole  40 mg Oral BID   rosuvastatin  10 mg Oral Daily    sacubitril-valsartan  1 tablet Oral BID   sodium chloride flush  3 mL Intravenous Q12H   spironolactone  12.5 mg Oral Daily      Subjective:  Tolerating tube feeds well  Objective:   Vitals:   03/17/22 2039 03/18/22 0039 03/18/22 0420 03/18/22 0814  BP: 132/70 (!) 142/66 138/69 121/63  Pulse: 68 67 69 68  Resp: _0 Temp: 98.3 F (36.8 C) 98.6 F (37 C) 98.3 F (36.8 C) 97.7 F (36.5 C)  TempSrc: Oral Oral Oral Oral  SpO2: 95% 96% 95% 98%  Weight:      Height:        Intake/Output Summary (Last 24 hours) at 03/18/2022 1058 Last  data filed at 03/18/2022 0816 Gross per 24 hour  Intake 713 ml  Output 1300 ml  Net -587 ml     Wt Readings from Last 3 Encounters:  03/13/22 57.2 kg  01/28/22 66.3 kg  01/14/22 67.2 kg   Physical Exam    General: Appearance:    elderly male in no acute distress, cortrack in place     Lungs:     Back on [place], diminished, respirations unlabored  Heart:    Normal heart rate. Normal rhythm.    MS:   All extremities are intact.   Neurologic:   Awake, alert, mild tremor        Data Reviewed:  I have personally reviewed following labs    CBC Lab Results  Component Value Date   WBC 5.8 03/18/2022   RBC 4.11 (L) 03/18/2022   HGB 9.9 (L) 03/18/2022   HCT 31.7 (L) 03/18/2022   MCV 77.1 (L) 03/18/2022   MCH 24.1 (L) 03/18/2022   PLT 461 (H) 03/18/2022   MCHC 31.2 03/18/2022   RDW 18.6 (H) 03/18/2022   LYMPHSABS 0.6 (L) 03/15/2022   MONOABS 0.2 03/15/2022   EOSABS 0.0 03/15/2022   BASOSABS 0.0 37/90/2409     Last metabolic panel Lab Results  Component Value Date   NA 138 03/18/2022   K 4.1 03/18/2022   CL 112 (H) 03/18/2022   CO2 18 (L) 03/18/2022   BUN 23 03/18/2022   CREATININE 1.24 03/18/2022   GLUCOSE 173 (H) 03/18/2022   GFRNONAA 60 (L) 03/18/2022   GFRAA >60 07/13/2018   CALCIUM 7.9 (L) 03/18/2022   PHOS 2.4 (L) 03/18/2022   PROT 5.6 (L) 03/06/2022   ALBUMIN 2.3 (L) 03/15/2022   LABGLOB 2.7  06/21/2018   AGRATIO 1.3 06/21/2018   BILITOT 0.5 03/06/2022   ALKPHOS 564 (H) 03/06/2022   AST 28 03/06/2022   ALT 26 03/06/2022   ANIONGAP 8 03/18/2022    CBG (last 3)  Recent Labs    03/17/22 1618 03/18/22 0038 03/18/22 0605  GLUCAP 104* 167* 195*      Coagulation Profile: No results for input(s): "INR", "PROTIME" in the last 168 hours.    Radiology Studies: I have personally reviewed the imaging studies  DG Abd Portable 1V  Result Date: 03/17/2022 CLINICAL DATA:  Feeding tube placement EXAM: PORTABLE ABDOMEN - 1 VIEW COMPARISON:  None Available. FINDINGS: Limited x-ray of the chest and upper abdomen demonstrates Dobbhoff tube the tip overlying the midbody of the stomach. Left upper quadrant. Elsewhere there are patchy opacity seen in the right lung and left lung base. Gas is seen in nondilated loops of bowel in the upper abdomen with some contrast in the stomach and the colon. IMPRESSION: Limited x-ray for tube placement has the tip overlying the mid body of the stomach Electronically Signed   By: Jill Side M.D.   On: 03/17/2022 14:27       [facility] 03/18/2022, 10:58 AM  Available via Epic secure chat 7am-7pm After 7 pm, please refer to night coverage provider listed on amion.

## 2022-03-19 DIAGNOSIS — I639 Cerebral infarction, unspecified: Secondary | ICD-10-CM | POA: Diagnosis not present

## 2022-03-19 LAB — GLUCOSE, CAPILLARY
Glucose-Capillary: 178 mg/dL — ABNORMAL HIGH (ref 70–99)
Glucose-Capillary: 220 mg/dL — ABNORMAL HIGH (ref 70–99)
Glucose-Capillary: 205 mg/dL — ABNORMAL HIGH (ref 70–99)
Glucose-Capillary: 241 mg/dL — ABNORMAL HIGH (ref 70–99)
Glucose-Capillary: 112 mg/dL — ABNORMAL HIGH (ref 70–99)

## 2022-03-19 LAB — PHOSPHORUS: Phosphorus: 2.9 mg/dL (ref 2.5–4.6)

## 2022-03-19 LAB — MAGNESIUM: Magnesium: 1.9 mg/dL (ref 1.7–2.4)

## 2022-03-19 LAB — BASIC METABOLIC PANEL
Anion gap: 8 (ref 5–15)
BUN: 22 mg/dL (ref 8–23)
CO2: 20 mmol/L — ABNORMAL LOW (ref 22–32)
Calcium: 8.2 mg/dL — ABNORMAL LOW (ref 8.9–10.3)
Chloride: 111 mmol/L (ref 98–111)
Creatinine, Ser: 1.22 mg/dL (ref 0.61–1.24)
GFR, Estimated: >60 mL/min (ref >60)
Glucose, Bld: 162 mg/dL — ABNORMAL HIGH (ref 70–99)
Potassium: 4.1 mmol/L (ref 3.5–5.1)
Sodium: 139 mmol/L (ref 135–145)

## 2022-03-19 MED ORDER — INSULIN ASPART 100 UNIT/ML IJ SOLN
5.0000 [IU] | Freq: Three times a day (TID) | INTRAMUSCULAR | Status: DC
  Administered 2022-03-19 – 2022-03-22 (×8): 5 [IU] via SUBCUTANEOUS

## 2022-03-19 MED ORDER — INSULIN GLARGINE-YFGN 100 UNIT/ML ~~LOC~~ SOLN
12.0000 [IU] | Freq: Every day | SUBCUTANEOUS | Status: DC
  Administered 2022-03-20 – 2022-03-21 (×2): 12 [IU] via SUBCUTANEOUS
  Filled 2022-03-19 (×2): qty 0.12

## 2022-03-19 NOTE — NC FL2 (Signed)
[place] LEVEL OF CARE FORM     IDENTIFICATION  Patient Name: William Barron Birthdate: [birth year] Sex: male Admission Date (Current Location): 03/05/2022  [facility] and [place] Number:  Herbalist and Address:  [facility], [place] [address] [place], [place] [address]      Provider Number: [id number]  Attending Physician Name and Address:  Geradine Girt, DO  Relative Name and Phone Number:  Yi, Haugan (Spouse) [phone] (Mobile)    Current Level of Care: Hospital Recommended Level of Care: [place] Prior Approval Number:    Date Approved/Denied:   PASRR Number: [id number]  Discharge Plan: Home    Current Diagnoses: Patient Active Problem List   Diagnosis Date Noted   Cardiac arrest ([place]) 61/44/3154   Acute systolic CHF (congestive heart failure) ([place]) 03/08/2022   Influenza A with pneumonia 03/08/2022   Occipital stroke ([place]) 03/05/2022   Leg swelling 01/20/2022   Nonrheumatic aortic valve stenosis 01/20/2022   Abnormal CT of the abdomen    Pressure injury of skin 09/05/2021   Goals of care, counseling/discussion 09/03/2021   Postoperative anemia due to acute blood loss 08/31/2021   Reactive thrombocytosis 08/31/2021   Malnutrition of moderate degree 08/19/2021   Debility 08/19/2021   Asymptomatic bacteriuria 08/15/2021   H/O Whipple procedure 08/13/2021   Chronic hiccups 08/13/2021   Candida esophagitis ([place]) 08/13/2021   Insulin-requiring or dependent type II diabetes mellitus ([place]) 08/13/2021   Anemia 08/13/2021   Hypoalbuminemia 08/13/2021   Frequent PVCs 08/13/2021   Prolonged QT interval 08/13/2021   Facial droop 08/13/2021   Hormone replacement therapy 08/13/2021   Lesion of parotid gland 08/13/2021   Protein-calorie malnutrition, severe ([place]) 08/13/2021   Feeding difficulties 08/12/2021   Posterior tibial tendon dysfunction, right 04/15/2021   Posterior tibial  tendinitis, right leg    Prostate cancer ([place]) 02/11/2020   Orthostatic tremor 10/01/2019   Dyslipidemia 09/12/2019   Nonrheumatic pulmonary valve insufficiency 09/12/2019   Red blood cell antibody positive, compatible PRBC difficult to obtain 02/15/2018   Diplopia 01/03/2018   Benign essential tremor 01/03/2018   Pain in joint of right ankle 05/17/2017   Foot pain 05/17/2017   Peroneal tendinitis 05/17/2017   Depression with anxiety 01/03/2017   Insomnia 05/05/2016   Coronary artery calcification seen on CAT scan    DOE (dyspnea on exertion)    Fatigue 02/27/2015   Elevated homocysteine 09/26/2014   Multiple myeloma ([place]) 09/26/2014   Acquired pes planus of right foot 07/02/2014   Posterior tibial tendon dysfunction 07/02/2014   Encounter for antineoplastic chemotherapy 01/02/2014   Kahler disease ([place]) 01/02/2014   Avitaminosis D 01/02/2014   Amyloidosis ([place]) 03/26/2013   Enlarged lymph node 02/14/2013   Pure hypercholesterolemia 03/29/2011   Memory disorder 09/21/2010   Labile hypertension 09/21/2010    Orientation RESPIRATION BLADDER Height & Weight     Self, Time, Situation, Place  O2 (3LNC) Incontinent Weight: 126 lb 12.2 oz (57.5 kg) Height:  '5\' 5"'$  (165.1 cm)  BEHAVIORAL SYMPTOMS/MOOD NEUROLOGICAL BOWEL NUTRITION STATUS      Continent Diet (see d/c summary)  AMBULATORY STATUS COMMUNICATION OF NEEDS Skin   Limited Assist Verbally Normal                       Personal Care Assistance Level of Assistance  Bathing, Feeding, Dressing Bathing Assistance: Limited assistance Feeding assistance: Limited assistance Dressing Assistance: Limited assistance     Functional Limitations Info  Sight, Hearing, Speech Sight Info: Impaired Hearing Info: Impaired Speech Info: Adequate    SPECIAL CARE FACTORS FREQUENCY  PT (By licensed PT), OT (By licensed OT)     PT Frequency: 5x/week OT Frequency: 5x/week            Contractures Contractures Info: Not present     Additional Factors Info  Code Status, Allergies Code Status Info: DNR Allergies Info: no known allergies           Current Medications (03/19/2022):  This is the current hospital active medication list Current Facility-Administered Medications  Medication Dose Route Frequency Provider Last Rate Last Admin   0.9 %  sodium chloride infusion  250 mL Intravenous PRN Jettie Booze, MD       acetaminophen (TYLENOL) tablet 650 mg  650 mg Oral Q4H PRN Jettie Booze, MD   650 mg at 03/12/22 1741   Or   acetaminophen (TYLENOL) suppository 650 mg  650 mg Rectal Q4H PRN Jettie Booze, MD       acidophilus (RISAQUAD) capsule 1 capsule  1 capsule Oral Daily Eulogio Bear U, DO   1 capsule at 03/19/22 1101   amiodarone (PACERONE) tablet 400 mg  400 mg Oral BID Geralynn Rile, MD   400 mg at 03/19/22 1101   Followed by   Derrill Memo ON 03/26/2022] amiodarone (PACERONE) tablet 400 mg  400 mg Oral Daily O'Neal, Cassie Freer, MD       amoxicillin-clavulanate (AUGMENTIN) 500-125 MG per tablet 1 tablet  1 tablet Oral BID Icard, Bradley L, DO   1 tablet at 03/19/22 1102   aspirin EC tablet 325 mg  325 mg Oral Daily Jettie Booze, MD   325 mg at 03/19/22 1103   clopidogrel (PLAVIX) tablet 75 mg  75 mg Oral Daily Jettie Booze, MD   75 mg at 03/19/22 1101   dapagliflozin propanediol (FARXIGA) tablet 10 mg  10 mg Oral Daily Jettie Booze, MD   10 mg at 03/19/22 1101   diclofenac Sodium (VOLTAREN) 1 % topical gel 2 g  2 g Topical QID Vann, Jessica U, DO   2 g at 03/19/22 1328   feeding supplement (JEVITY 1.5 CAL/FIBER) liquid 1,000 mL  1,000 mL Per Tube Continuous Eulogio Bear U, DO 50 mL/hr at 03/19/22 0703 1,000 mL at 03/19/22 0703   feeding supplement (PROSource TF20) liquid 60 mL  60 mL Per Tube Daily Eulogio Bear U, DO   60 mL at 03/18/22 0835   heparin injection 5,000 Units  5,000 Units Subcutaneous Q8H Jettie Booze, MD   5,000 Units at 03/19/22 1329    insulin aspart (novoLOG) injection 0-9 Units  0-9 Units Subcutaneous TID WC Jettie Booze, MD   3 Units at 03/19/22 1327   insulin aspart (novoLOG) injection 5 Units  5 Units Subcutaneous TID WC Vann, Jessica U, DO       [START ON 03/20/2022] insulin glargine-yfgn (SEMGLEE) injection 12 Units  12 Units Subcutaneous Daily Vann, Jessica U, DO       ipratropium-albuterol (DUONEB) 0.5-2.5 (3) MG/3ML nebulizer solution 3 mL  3 mL Nebulization Q4H PRN Jettie Booze, MD       lidocaine (LIDODERM) 5 % 1 patch  1 patch Transdermal Q24H Dana Allan I, MD   1 patch at 03/14/22 1654   lipase/protease/amylase (CREON) capsule 72,000 Units  72,000 Units Oral Q6H Eulogio Bear U, DO   72,000 Units at 03/18/22 1309   megestrol (MEGACE)  400 MG/10ML suspension 400 mg  400 mg Oral Daily Dana Allan I, MD   400 mg at 03/19/22 1102   mirabegron ER (MYRBETRIQ) tablet 25 mg  25 mg Oral Daily Jettie Booze, MD   25 mg at 03/19/22 1101   nystatin (MYCOSTATIN) 100000 UNIT/ML suspension 500,000 Units  5 mL Oral QID Dana Allan I, MD   500,000 Units at 03/19/22 1328   pantoprazole (PROTONIX) EC tablet 40 mg  40 mg Oral BID Vann, Jessica U, DO   40 mg at 03/18/22 2300   rosuvastatin (CRESTOR) tablet 10 mg  10 mg Oral Daily Larey Dresser, MD   10 mg at 03/18/22 2300   sacubitril-valsartan (ENTRESTO) 24-26 mg per tablet  1 tablet Oral BID Lyda Jester M, PA-C   1 tablet at 03/19/22 1101   sodium chloride flush (NS) 0.9 % injection 3 mL  3 mL Intravenous Q12H Jettie Booze, MD   3 mL at 03/19/22 1115   sodium chloride flush (NS) 0.9 % injection 3 mL  3 mL Intravenous PRN Jettie Booze, MD   3 mL at 03/13/22 2125   spironolactone (ALDACTONE) tablet 12.5 mg  12.5 mg Oral Daily Jettie Booze, MD   12.5 mg at 03/19/22 1101   white petrolatum (VASELINE) gel   Topical PRN Geradine Girt, DO         Discharge Medications: Please see discharge summary for a list of  discharge medications.  Relevant Imaging Results:  Relevant Lab Results:   Additional Information SS# [id number]  Bethann Berkshire, LCSW

## 2022-03-19 NOTE — Progress Notes (Signed)
Inpatient Rehab Admissions Coordinator:   Pt.'s son states he plans to come stay with pt. After d/c from CIR and provide care. I will send case to the [place] for auth and pursue for admit.   Clemens Catholic, [place], [place] Admissions Coordinator  [phone] ([place]) [phone] (office)

## 2022-03-19 NOTE — Progress Notes (Signed)
Nutrition Brief Note  Met with patient and wife at bedside. They report patient has been tolerating tube feeds well. Patient is now at goal rate. Noted that some doses of Creon had been marked as "refused." Discussed with patient and wife that Creon was ordered Q6hrs instead of TID with meals as patient is currently on continuous tube feeds. Encouraged taking Creon as ordered.  Loanne Drilling, MS, RD, LDN, CNSC Pager number available on Amion

## 2022-03-19 NOTE — Progress Notes (Signed)
Physical Therapy Treatment Patient Details Name: William Barron MRN: [id number] DOB: 02-29-1946 Today's Date: 03/19/2022   History of Present Illness 77 year old male admitted 12/22 with right-sided headache and blurred vision. +Acute right PCA stroke. During hospitalization pt found to be Flu + on 03/07/22. Pt also had code blue called on 03/11/22. Pt received compressions before regaining ROSC s/p VT arrest. Pt underwent cardiac cath on 03/11/22. PMHX:  amyloidosis, multiple myeloma, history of prostate cancer, history of pancreatic cancer status post Whipple, dyslipidemia, diabetes.    PT Comments    Patient progressing very slowly towards PT goals. Session focused on transfers and gait training. Pt appears to be having more difficulty with breathing today. Requires Mod A and RW for standing and for gait training short distances with close chair follow due to weakness and decreased endurance.  Limited by 3/4 DOE and fatigue. Sp02 dropped to 86% on last bout of ambulation on 3L/min 02 [place]. Requires seated rest breaks between ambulation bouts with cues for breathing. Son present and supportive. Encouraged OOB to chair for all meals to increase activity and upright with attempts at weaning. Will follow.   Recommendations for follow up therapy are one component of a multi-disciplinary discharge planning process, led by the attending physician.  Recommendations may be updated based on patient status, additional functional criteria and insurance authorization.  Follow Up Recommendations  Acute inpatient rehab (3hours/day) Can patient physically be transported by private vehicle: Yes   Assistance Recommended at Discharge Frequent or constant Supervision/Assistance  Patient can return home with the following A lot of help with walking and/or transfers;A little help with bathing/dressing/bathroom;Assistance with cooking/housework;Help with stairs or ramp for entrance;Assist for transportation    Equipment Recommendations  None recommended by PT    Recommendations for Other Services       Precautions / Restrictions Precautions Precautions: Fall;Other (comment) Precaution Comments: vision impaired, feeding tube Required Braces or Orthoses: Other Brace Other Brace: RLE AFO and shoes, pt and son think he does worse with AFO on Restrictions Weight Bearing Restrictions: No     Mobility  Bed Mobility Overal bed mobility: Needs Assistance Bed Mobility: Supine to Sit     Supine to sit: Min guard, HOB elevated     General bed mobility comments: Min guard for safety. use of rails and increased time.    Transfers Overall transfer level: Needs assistance Equipment used: Rolling walker (2 wheels) Transfers: Sit to/from Stand Sit to Stand: Mod assist, +2 safety/equipment           General transfer comment: Mod A to power to standing with cues for hand placement/technique. Stood from [employer], from chair x2, flexed posture at hips/knees and tremors present throughout.    Ambulation/Gait Ambulation/Gait assistance: Mod assist Gait Distance (Feet): 8 Feet (+8' + 10') Assistive device: Rolling walker (2 wheels) Gait Pattern/deviations: Narrow base of support, Decreased stride length, Trunk flexed, Knee flexed in stance - right, Knee flexed in stance - left, Shuffle, Step-to pattern, Decreased step length - left Gait velocity: decreased Gait velocity interpretation: <1.31 ft/sec, indicative of household ambulator   General Gait Details: Slow, unsteady gait with decreased step length LLE, flexed posture throughout and assist needed for forward momentum and RW management. 2 seated rest breaks. 3/4 DOE. Sp02 dropped to 86% on last bout on 3L/min 02 . Cues for pursed lip breathing.   Stairs             Wheelchair Mobility    Modified Rankin (Stroke Patients  Only) Modified Rankin (Stroke Patients Only) Pre-Morbid Rankin Score: Moderate disability Modified Rankin:  Moderately severe disability     Balance Overall balance assessment: Needs assistance Sitting-balance support: Feet supported, No upper extremity supported Sitting balance-Leahy Scale: Fair Sitting balance - Comments: Prefers UE support, supervision for static sitting balance   Standing balance support: During functional activity, Bilateral upper extremity supported, Reliant on assistive device for balance Standing balance-Leahy Scale: Poor Standing balance comment: relies on BUE and external support                            Cognition Arousal/Alertness: Awake/alert Behavior During Therapy: WFL for tasks assessed/performed Overall Cognitive Status: Impaired/Different from baseline Area of Impairment: Attention, Memory, Following commands, Safety/judgement, Awareness, Problem solving                   Current Attention Level: Selective Memory: Decreased recall of precautions, Decreased short-term memory Following Commands: Follows one step commands consistently, Follows one step commands with increased time, Follows multi-step commands inconsistently Safety/Judgement: Decreased awareness of safety, Decreased awareness of deficits Awareness: Emergent Problem Solving: Slow processing, Requires verbal cues, Difficulty sequencing, Decreased initiation General Comments: Less verbal today, concentrating on breathing.        Exercises      General Comments General comments (skin integrity, edema, etc.): Son present during session. Sp02 dropped to 86% on last bout of ambulation on 3L/min 02 [place], recovers quickly.      Pertinent Vitals/Pain Pain Assessment Pain Assessment: No/denies pain    Home Living                          Prior Function            PT Goals (current goals can now be found in the care plan section) Acute Rehab PT Goals Patient Stated Goal: to get to rehab PT Goal Formulation: With patient/family Time For Goal Achievement:  04/02/22 Potential to Achieve Goals: Fair Progress towards PT goals: Progressing toward goals (slowly)    Frequency    Min 4X/week      PT Plan Current plan remains appropriate    Co-evaluation              AM-PAC PT "6 Clicks" Mobility   Outcome Measure  Help needed turning from your back to your side while in a flat bed without using bedrails?: A Little Help needed moving from lying on your back to sitting on the side of a flat bed without using bedrails?: A Little Help needed moving to and from a bed to a chair (including a wheelchair)?: A Lot Help needed standing up from a chair using your arms (e.g., wheelchair or bedside chair)?: A Lot Help needed to walk in hospital room?: Total Help needed climbing 3-5 steps with a railing? : Total 6 Click Score: 12    End of Session Equipment Utilized During Treatment: Gait belt;Oxygen Activity Tolerance: Other (comment) (SOB) Patient left: in chair;with call bell/phone within reach;with family/visitor present (son taking pt around in chair, RN aware) Nurse Communication: Mobility status PT Visit Diagnosis: Unsteadiness on feet (R26.81);Other abnormalities of gait and mobility (R26.89);Muscle weakness (generalized) (M62.81);History of falling (Z91.81);Difficulty in walking, not elsewhere classified (R26.2)     Time: 5885-0277 PT Time Calculation (min) (ACUTE ONLY): 30 min  Charges:  $Gait Training: 23-37 mins  Zettie Cooley, DPT Acute Rehabilitation Services Secure chat preferred Office [place] 03/19/2022, 3:41 PM

## 2022-03-19 NOTE — Inpatient Diabetes Management (Signed)
Inpatient Diabetes Program Recommendations  AACE/ADA: New Consensus Statement on Inpatient Glycemic Control (2015)  Target Ranges:  Prepandial:   less than 140 mg/dL      Peak postprandial:   less than 180 mg/dL (1-2 hours)      Critically ill patients:  140 - 180 mg/dL    Latest Reference Range & Units 03/18/22 06:05 03/18/22 11:38 03/18/22 17:41 03/18/22 21:35  Glucose-Capillary 70 - 99 mg/dL 195 (H)  2 units Novolog '@0652'$   3 units Novolog '@0834'$   293 (H)  8 units Novolog '@1310'$     10 units Semglee '@1107'$  236 (H)  6 units Novolog  204 (H)  (H): Data is abnormally high  Latest Reference Range & Units 03/19/22 06:24 03/19/22 12:25  Glucose-Capillary 70 - 99 mg/dL 220 (H)  6 units Novolog  10 units Semglee '@1111'$  205 (H)  (H): Data is abnormally high    Home DM Meds: Novolog 8 units TID with meals        Tresiba 6 units Daily   Current Orders: Semglee 10 units Daily     Novolog Sensitive Correction Scale/ SSI (0-9 units) TID AC      Novolog 3 units TID with meals     MD- Please consider:  1. Increase Semglee to 12 units Daily  2. Increase Novolog Meal Coverage to 5 units TID with meals    --Will follow patient during hospitalization--  Wyn Quaker RN, MSN, [place] Diabetes Coordinator Inpatient Glycemic Control Team Team Pager: [phone] (8a-5p)

## 2022-03-19 NOTE — Progress Notes (Addendum)
[facility]                                                                              William Barron, is a 77 y.o. male, DOB - 09-04-1945, QZE:[id number] Admit date - 03/05/2022    Outpatient Primary MD for the patient is [name], MD  LOS - 13  days  Chief Complaint  Patient presents with   Headache       Brief summary   Patient is a 76 year old male with history of pancreatic CA status post Whipple, prostate CA, multiple myeloma, amyloidosis, dyslipidemia, DM, presented to ED with right-sided headache and blurred vision.  Last known well was in the evening of 03/04/2022.  He woke up on the morning of admission on 12/22 with difficulty seeing out of his right eye.  He went to see an ophthalmologist and was referred to ER CT head showed acute/subacute right occipital CVA  03/11/2022: Patient was found unresponsive and pulseless around 4:45 AM.  Patient was found to have gone into VT/VF cardiac arrest in setting of low EF 30 to 35%.  Patient has been successfully resuscitated.  Patient underwent left and right heart cardiac catheterization afterwards.   1/1- did well with MBS- diet order placed 1/3- cortrack placed with tube feeds 1/5- family deciding between CIR and SNF   Assessment & Plan    Acute right PCA stroke (HCC) right CN VI palsy, chronic -Presented with visual issues, having difficulty seeing half of the clock.  CT head showed right PCA territory stroke. -MRI brain showed acute to early subacute right PCA territory infarct.  Associated mild petechial blood products without frank hemorrhagic transformation or significant residual mass effect.  Underlying moderate chronic microvascular ischemic disease -CTA head and neck showed acute or early subacute infarct in the right occipital lobe, suspected high-grade stenosis versus occlusion of the distal right PCA. -2D echo: EF of 35 to 40% with global hypokinesis, mild AS, mild pulmonary regurg, mild  MR, mild AR (EF 50 to 55% on echo in 11/2021) -Per neuro, continue aspirin 325 mg and Plavix 75 mg DAPT for 3 months given intracranial stenosis/occlusion then aspirin 81 mg alone daily indefinitely  -Lipid panel showed LDL 66, at goal, no need of statin per neurology -Hemoglobin A1c 6.5 -Loop recorder per neuro, timing per cardiology? Not sure still needs-- last seen by Dr. Erlinda Hong- would ask him when he comes back on service this weekend   New onset systolic CHF, EF 35 to 22%  -2D echo showed EF of 35 to 40% with global hypokinesis (was 50 to 55% on echo in 11/2021) -Continue Coreg, losartan '25mg'$  daily, Farxiga 10 mg daily  -Lasix as needed -cards consult appreciated:   - Continue Entresto 24-26 mg bid  - Does not need a diuretic currently, monitor wt/I/os closely w/ TFs  - Continue dapagliflozin and spironolactone.  - Would hold off on inotropic support for the time being, think main problem at this time is PNA/lung infiltrates and failure to thrive/malnutrition.    VT arrest:  - amiodarone.  Suspect triggered by lateral wall scar (prior MI versus myocarditis).  EP has seen,  recommend amiodarone and Lifevest at discharge to follow post-discharge course and decide on ICD down the road depending on his trajectory.  - Continue amiodarone po.   Influenza A positive -treated with tamiflu  Bibasilar infiltrates: -Procalcitonin came back normal. -Speech evaluation. -abx-- stop date placed -pulmonary toiletry -wean O2 off  Oral thrush/throat pain: -Throat pain has improved. -Complete course of oral nystatin. -may need GI consult for ? Stricture outpatient   Hypokalemia -Replete  GERD -Continue PPI  Diabetes mellitus type 2, IDDM -SSI  History of pancreatic CA status post Whipple procedure -Continue Creon 72,000 units  -Follows with [name]  History of AL amyloidosis ([place]),  Multiple myeloma ([place]) -Outpatient follow-up with his oncologist, [name] at [facility].  History of prostate CA -Continue outpatient follow-up with urology, Dr. Jeffie Pollock, completed XRT on 06/02/2020    Lesion of parotid gland -MRI brain incidentally also showed 1.8 cm T2 hypointense lesion within the posterior aspect of the right parotid gland, indeterminate.  Recommended nonemergent outpatient ENT evaluation -CTA showed 1.6 cm partially calcified lesion in the posterior aspect of right parotid gland with multiple surrounding smaller lesion, could potentially represent primary parotid neoplasm benign or malignant or enlarged lymph nodes, recommended outpatient ENT evaluation  Malnutrition -cortrack placed 1/3    Code Status: Full code DVT Prophylaxis:  Place and maintain sequential compression device Start: 03/15/22 1355 heparin injection 5,000 Units Start: 03/11/22 2200 SCD's Start: 03/05/22 2235  Level of Care: Level of care: Telemetry Cardiac Family Communication: Discussed with patient/wife and son Disposition Plan:      CIR -- patient medically stable to go to rehab with cortrack feeding tube    Consultants:   Neurology Cardiology Electrophysiology. Pulmonary Palliative care   Medications  acidophilus  1 capsule Oral Daily   amiodarone  400 mg Oral BID   Followed by   Derrill Memo ON 03/26/2022] amiodarone  400 mg Oral Daily   amoxicillin-clavulanate  1 tablet Oral BID   aspirin EC  325 mg Oral Daily   clopidogrel  75 mg Oral Daily   dapagliflozin propanediol  10 mg Oral Daily   diclofenac Sodium  2 g Topical QID   feeding supplement (PROSource TF20)  60 mL Per Tube Daily   heparin  5,000 Units Subcutaneous Q8H   insulin aspart  0-9 Units Subcutaneous TID WC   insulin aspart  3 Units Subcutaneous TID WC   insulin glargine-yfgn  10 Units Subcutaneous Daily   lidocaine  1 patch Transdermal Q24H   lipase/protease/amylase  72,000 Units Oral Q6H   megestrol  400 mg Oral Daily   mirabegron ER  25 mg Oral Daily   nystatin  5 mL Oral QID   pantoprazole  40  mg Oral BID   rosuvastatin  10 mg Oral Daily   sacubitril-valsartan  1 tablet Oral BID   sodium chloride flush  3 mL Intravenous Q12H   spironolactone  12.5 mg Oral Daily      Subjective:  Feeling stronger Family states he was considering hospice yesterday but today wants to do rehab now  Objective:   Vitals:   03/18/22 2347 03/19/22 0404 03/19/22 0500 03/19/22 0827  BP: (!) 101/58 (!) 103/56  90/66  Pulse: 77   81  Resp: 19 20    Temp: 97.7 F (36.5 C) 97.7 F (36.5 C)  97.6 F (36.4 C)  TempSrc: Oral Oral  Oral  SpO2: 96% 97%    Weight:   57.5 kg   Height:  Intake/Output Summary (Last 24 hours) at 03/19/2022 1222 Last data filed at 03/18/2022 2000 Gross per 24 hour  Intake 100 ml  Output 400 ml  Net -300 ml     Wt Readings from Last 3 Encounters:  03/19/22 57.5 kg  01/28/22 66.3 kg  01/14/22 67.2 kg   Physical Exam    General: Appearance:    elderly male in no acute distress, cortrack in place     Lungs:      on [place], diminished, respirations unlabored  Heart:    Normal heart rate. Normal rhythm.    MS:   All extremities are intact.   Neurologic:   Awake, alert and oriented x3, mild tremor        Data Reviewed:  I have personally reviewed following labs    CBC Lab Results  Component Value Date   WBC 5.8 03/18/2022   RBC 4.11 (L) 03/18/2022   HGB 9.9 (L) 03/18/2022   HCT 31.7 (L) 03/18/2022   MCV 77.1 (L) 03/18/2022   MCH 24.1 (L) 03/18/2022   PLT 461 (H) 03/18/2022   MCHC 31.2 03/18/2022   RDW 18.6 (H) 03/18/2022   LYMPHSABS 0.6 (L) 03/15/2022   MONOABS 0.2 03/15/2022   EOSABS 0.0 03/15/2022   BASOSABS 0.0 25/07/3974     Last metabolic panel Lab Results  Component Value Date   NA 139 03/19/2022   K 4.1 03/19/2022   CL 111 03/19/2022   CO2 20 (L) 03/19/2022   BUN 22 03/19/2022   CREATININE 1.22 03/19/2022   GLUCOSE 162 (H) 03/19/2022   GFRNONAA >60 03/19/2022   GFRAA >60 07/13/2018   CALCIUM 8.2 (L) 03/19/2022   PHOS 2.9  03/19/2022   PROT 5.6 (L) 03/06/2022   ALBUMIN 2.3 (L) 03/15/2022   LABGLOB 2.7 06/21/2018   AGRATIO 1.3 06/21/2018   BILITOT 0.5 03/06/2022   ALKPHOS 564 (H) 03/06/2022   AST 28 03/06/2022   ALT 26 03/06/2022   ANIONGAP 8 03/19/2022    CBG (last 3)  Recent Labs    03/18/22 2135 03/19/22 0410 03/19/22 0624  GLUCAP 204* 178* 220*      Coagulation Profile: No results for input(s): "INR", "PROTIME" in the last 168 hours.    Radiology Studies: I have personally reviewed the imaging studies  DG Abd Portable 1V  Result Date: 03/17/2022 CLINICAL DATA:  Feeding tube placement EXAM: PORTABLE ABDOMEN - 1 VIEW COMPARISON:  None Available. FINDINGS: Limited x-ray of the chest and upper abdomen demonstrates Dobbhoff tube the tip overlying the midbody of the stomach. Left upper quadrant. Elsewhere there are patchy opacity seen in the right lung and left lung base. Gas is seen in nondilated loops of bowel in the upper abdomen with some contrast in the stomach and the colon. IMPRESSION: Limited x-ray for tube placement has the tip overlying the mid body of the stomach Electronically Signed   By: Jill Side M.D.   On: 03/17/2022 14:27       [facility] 03/19/2022, 12:22 PM  Available via Epic secure chat 7am-7pm After 7 pm, please refer to night coverage provider listed on amion.

## 2022-03-19 NOTE — Progress Notes (Signed)
Inpatient Rehab Admissions Coordinator:    I spoke with pt.'s son regarding potential CIR admit. Explained that current caregiver, [name] (Pt's wife) is not able to provide the care that Pt. Will need at the end of CIR. He understands and states that he is considering coming to stay and care for the pt. Himself. If he is willing to do that, can consider CIR admit for Pt. Will follow up with him tomorrow.   Clemens Catholic, [place], [name] Admissions Coordinator  [phone] ([place]) [phone] (office)

## 2022-03-19 NOTE — Progress Notes (Addendum)
Patient ID: William Barron, male   DOB: Nov 11, 1945, 77 y.o.   MRN: [id number]   Advanced Heart Failure Rounding Note  PCP-Cardiologist: Minus Breeding, MD   Subjective:    Tolerating Entresto ok, SBP 90s-low 100s.   SCr stable 1.22. K 4.1   Getting TFs through [employer] also eating some.   Palliative care following, CODE status changed to DNR. Plan to continue full scope interventions.   Remains weak. PT recommending CIR. Awaiting approval   Denies CP and dyspnea   Objective:   Weight Range: 57.5 kg Body mass index is 21.09 kg/m.   Vital Signs:   Temp:  [97.6 F (36.4 C)-98.2 F (36.8 C)] 97.6 F (36.4 C) (01/05 0827) Pulse Rate:  [72-82] 81 (01/05 0827) Resp:  [19-20] 20 (01/05 0404) BP: (90-114)/(44-66) 90/66 (01/05 0827) SpO2:  [60 %-100 %] 97 % (01/05 0404) Weight:  [57.5 kg] 57.5 kg (01/05 0500) Last BM Date : 03/16/22  Weight change: Filed Weights   03/11/22 0906 03/13/22 0410 03/19/22 0500  Weight: 58.5 kg 57.2 kg 57.5 kg    Intake/Output:   Intake/Output Summary (Last 24 hours) at 03/19/2022 1031 Last data filed at 03/18/2022 2000 Gross per 24 hour  Intake 100 ml  Output 400 ml  Net -300 ml      Physical Exam    General:  weak appearing, sitting up in bed. No distress  HEENT: normal + cor trak, Right eye diplopia Neck: supple. JVD 7 cm. Carotids 2+ bilat; no bruits. No lymphadenopathy or thyromegaly appreciated. Cor: PMI nondisplaced. Regular rate & rhythm. No rubs, gallops or murmurs. Lungs: clear Abdomen: soft, nontender, nondistended. No hepatosplenomegaly. No bruits or masses. Good bowel sounds. Extremities: no cyanosis, clubbing, rash, edema Neuro: alert & oriented x 3, cranial nerves grossly intact. moves all 4 extremities w/o difficulty. Affect pleasant.   Telemetry   NSR 60s, occasional PVCs   Labs    CBC Recent Labs    03/17/22 0059 03/18/22 0100  WBC 5.4 5.8  HGB 9.8* 9.9*  HCT 31.8* 31.7*  MCV 77.6* 77.1*  PLT 431* 461*    Basic Metabolic Panel Recent Labs    03/18/22 0100 03/19/22 0208  NA 138 139  K 4.1 4.1  CL 112* 111  CO2 18* 20*  GLUCOSE 173* 162*  BUN 23 22  CREATININE 1.24 1.22  CALCIUM 7.9* 8.2*  MG 2.1 1.9  PHOS 2.4* 2.9   Liver Function Tests No results for input(s): "AST", "ALT", "ALKPHOS", "BILITOT", "PROT", "ALBUMIN" in the last 72 hours.  No results for input(s): "LIPASE", "AMYLASE" in the last 72 hours. Cardiac Enzymes No results for input(s): "CKTOTAL", "CKMB", "CKMBINDEX", "TROPONINI" in the last 72 hours.  BNP: BNP (last 3 results) Recent Labs    03/07/22 1237  BNP 1,326.2*    ProBNP (last 3 results) No results for input(s): "PROBNP" in the last 8760 hours.   D-Dimer No results for input(s): "DDIMER" in the last 72 hours. Hemoglobin A1C No results for input(s): "HGBA1C" in the last 72 hours. Fasting Lipid Panel No results for input(s): "CHOL", "HDL", "LDLCALC", "TRIG", "CHOLHDL", "LDLDIRECT" in the last 72 hours. Thyroid Function Tests No results for input(s): "TSH", "T4TOTAL", "T3FREE", "THYROIDAB" in the last 72 hours.  Invalid input(s): "FREET3"  Other results:   Imaging    No results found.   Medications:     Scheduled Medications:  acidophilus  1 capsule Oral Daily   amiodarone  400 mg Oral BID   Followed by   [  START ON 03/26/2022] amiodarone  400 mg Oral Daily   amoxicillin-clavulanate  1 tablet Oral BID   aspirin EC  325 mg Oral Daily   clopidogrel  75 mg Oral Daily   dapagliflozin propanediol  10 mg Oral Daily   diclofenac Sodium  2 g Topical QID   feeding supplement (PROSource TF20)  60 mL Per Tube Daily   heparin  5,000 Units Subcutaneous Q8H   insulin aspart  0-9 Units Subcutaneous TID WC   insulin aspart  3 Units Subcutaneous TID WC   insulin glargine-yfgn  10 Units Subcutaneous Daily   lidocaine  1 patch Transdermal Q24H   lipase/protease/amylase  72,000 Units Oral Q6H   megestrol  400 mg Oral Daily   mirabegron ER  25 mg  Oral Daily   nystatin  5 mL Oral QID   pantoprazole  40 mg Oral BID   rosuvastatin  10 mg Oral Daily   sacubitril-valsartan  1 tablet Oral BID   sodium chloride flush  3 mL Intravenous Q12H   spironolactone  12.5 mg Oral Daily    Infusions:  sodium chloride     feeding supplement (JEVITY 1.5 CAL/FIBER) 1,000 mL (03/19/22 0703)    PRN Medications: sodium chloride, acetaminophen **OR** acetaminophen (TYLENOL) oral liquid 160 mg/5 mL **OR** acetaminophen, ipratropium-albuterol, sodium chloride flush, white petrolatum    Assessment/Plan   1. Acute on chronic systolic CHF: Echo (4/19) with EF 55-60%.  Echo 12/23 with EF 30-35%, normal RV, normal IVC.  LHC/RHC 12/23 with moderate CAD (60% mLAD, 70% dLAD, 70% D2, 60% dRCA) and low CI 1.87 with normal right and left heart filling pressures.  CAD does not explain fall in EF.  Cardiac MRI showed LV EF 33% with diffuse hypokinesis, RV EF 46% predominantly mid-wall LGE with some subendocardial component in the basal inferolateral wall and focally in the mid lateral wall.  Pattern is not clearly due to prior MI but cannot rule this out given moderate CAD on cath. Alternatively could be due to prior myocarditis. T2 not elevated in the basal inferolateral wall, so do not suspect active inflammation. Findings NOT consistent with cardiac AL amyloidosis. On exam and by recent [place], he is not volume overloaded.   - Continue Entresto 24-26 mg bid  - Does not need a diuretic currently, monitor wt/I/os closely w/ TFs  - Continue dapagliflozin and spironolactone.  - Would hold off on inotropic support for the time being, think main problem at this time is PNA/lung infiltrates and failure to thrive/malnutrition.  2. CVA: Right occipital CVA with distal right PCA severe stenosis vs occlusion. Presented with visual loss, now improved.  Neurology has signed off.  - ASA 325 + Plavix 75 x 3 months then ASA 81 daily.  - Crestor 10 daily 3. VT arrest: 1 minute CPR with  ROSC.  He is on amiodarone.  Suspect triggered by lateral wall scar (prior MI versus myocarditis).  EP has seen, recommended amiodarone and Lifevest at discharge to follow post-discharge course and decide on ICD down the road depending on his trajectory. Not likely a good ICD candidate given overall prognosis. - Continue amiodarone po.  4. CAD: LHC this admission with moderate CAD (60% mLAD, 70% dLAD, 70% D2, 60% dRCA).  Out of proportion to cardiomyopathy.  As noted above, cMRI suggested most likely prior myocarditis but cannot fully rule out prior MI involving basal lateral wall. No chest pain.  - Continue ASA as above.  - Crestor 10 mg daily.  5.  Multiple myeloma with AL amyloidosis isolated to lymph nodes: Followed at [facility] since 2014. Treated with chemo in past. Has known bulky mediastinal/hilar lymphadenopathy. Has been off chemo/steroids for a year d/t frailty 6. Prostate cancer: Treated with radiation in 2022.  7. Right eye diplopia: Chronic, 6th nerve palsy.  8. H/o Whipple procedure for IPMN in 5/23.  9. Influenza A: Has been treated with 5 days oseltamivir this admission.  9. Pulmonary:  CT chest this admission showed extensive bilateral multilobar PNA, R>L and bulky mediastinal/hilar LAN, seen on prior studies. He has a cough, no fever or leukocytosis and PCT not significantly elevated.  He had a VT arrest with 1 minute CPR, strong suspicion for aspiration pneumonitis.  Pulmonary has seen, no utility to bronchoscopy.  Breathing improved. Stable on RA  - Continue Augmentin for aspiration coverage.  - Had barium swallow. Concern for possible proximal esophageal stricture. Dysphagia 2 diet - SLP following - Incentive spirometry.  10. Frailty/failure to thrive: Did not recover well at all post-Whipple in 5/23 per family.  Has had poor appetite.  Having trouble swallowing.  Albumin low, malnourished.  - Mobilize, PT. CIR recommended  - TFs through [facility]  11. Thrush: With odynophagia.  -  Continue Nystatin.   GOC Think he is fairly stable right now from cardiac standpoint, suspect main issue is aspiration pneumonitis and frailty/malnutrition.   Concern for poor long-term prognosis with failure to thrive, VT arrest, PNA. Palliative care following. CODE status changed to DNR. Plan to continue full scope interventions.  PT recommending CIR. Awaiting approval.   Length of Stay: [address] PA-C  03/19/2022, 10:31 AM  Advanced Heart Failure Team Pager [phone] (M-F; 7a - 5p)  Please contact [facility] for night-coverage after hours (5p -7a ) and weekends on [email or web address]   Patient seen with PA, agree with the above note.   Gradually getting stronger.  Still waiting on decision about CIR.  Creatinine stable, SBP 90s-100s.  Getting TFs via Cortrack.   General: NAD, frail.  Neck: No JVD, no thyromegaly or thyroid nodule.  Lungs: Clear to auscultation bilaterally with normal respiratory effort. CV: Nondisplaced PMI.  Heart regular S1/S2, no S3/S4, no murmur.  No peripheral edema.   Abdomen: Soft, nontender, no hepatosplenomegaly, no distention.  Skin: Intact without lesions or rashes.  Neurologic: Alert and oriented x 3.  Psych: Normal affect. Extremities: No clubbing or cyanosis.  HEENT: Normal.   Continue current cardiac meds.  I do not think he needs a diuretic at this time.    Wearing 2L [place], he desaturates at times on room air.  Recovering from aspiration pneumonitis, likely occurred post-arrest.   He remains very frail, nutrition and PT are going to be the most important issues for now.  From cardiac standpoint, can go to rehab.   Cardiology will see again Monday if he remains in hospital.   Loralie Champagne 03/19/2022 12:09 PM

## 2022-03-19 NOTE — TOC Progression Note (Signed)
Transition of Care [facility]) - Progression Note    Patient Details  Name: PAYAM GRIBBLE MRN: [id number] Date of Birth: [birth year]  Transition of Care [facility]) CM/SW [place], [place] Phone Number: 03/19/2022, 2:53 PM  Clinical Narrative:     Fl2 completed. CSW contacted [name] admissions to confirm bed offer that is in the hub. [name] originally offered bed on 12/24 but though disposition changed to CIR. They have concerns that pt will need LTC after STR which they would not be able to provide. Admissions will have to review the referral and then will follow up with decision. CSW sent updated clinicals in hub.   Expected Discharge Plan: [place] Barriers to Discharge: Ship broker, Continued Medical Work up, SNF Pending bed offer  Expected Discharge Plan and Services In-house Referral: Clinical Social Work     Living arrangements for the past 2 months: Single Family Home                                       Social Determinants of Health (SDOH) Interventions [place]: No Food Insecurity (03/09/2022)  Housing: Low Risk  (03/09/2022)  Transportation Needs: No Transportation Needs (03/09/2022)  Utilities: Not At Risk (03/09/2022)  Tobacco Use: Medium Risk (03/11/2022)    Readmission Risk Interventions     No data to display

## 2022-03-20 DIAGNOSIS — E119 Type 2 diabetes mellitus without complications: Secondary | ICD-10-CM | POA: Diagnosis not present

## 2022-03-20 DIAGNOSIS — I639 Cerebral infarction, unspecified: Secondary | ICD-10-CM | POA: Diagnosis not present

## 2022-03-20 DIAGNOSIS — E8581 Light chain (AL) amyloidosis: Secondary | ICD-10-CM

## 2022-03-20 DIAGNOSIS — Z90410 Acquired total absence of pancreas: Secondary | ICD-10-CM | POA: Diagnosis not present

## 2022-03-20 LAB — GLUCOSE, CAPILLARY
Glucose-Capillary: 111 mg/dL — ABNORMAL HIGH (ref 70–99)
Glucose-Capillary: 179 mg/dL — ABNORMAL HIGH (ref 70–99)
Glucose-Capillary: 197 mg/dL — ABNORMAL HIGH (ref 70–99)
Glucose-Capillary: 205 mg/dL — ABNORMAL HIGH (ref 70–99)
Glucose-Capillary: 245 mg/dL — ABNORMAL HIGH (ref 70–99)
Glucose-Capillary: 271 mg/dL — ABNORMAL HIGH (ref 70–99)

## 2022-03-20 LAB — PHOSPHORUS: Phosphorus: 3.2 mg/dL (ref 2.5–4.6)

## 2022-03-20 LAB — MAGNESIUM: Magnesium: 2.2 mg/dL (ref 1.7–2.4)

## 2022-03-20 NOTE — Hospital Course (Signed)
Patient is a 77 year old male with history of pancreatic CA status post Whipple, prostate CA, multiple myeloma, amyloidosis, dyslipidemia, DM, presented to ED with right-sided headache and blurred vision.  Last known well was in the evening of 03/04/2022.  He woke up on the morning of admission on 12/22 with difficulty seeing out of his right eye.  He went to see an ophthalmologist and was referred to ER CT head showed acute/subacute right occipital CVA   03/11/2022: Patient was found unresponsive and pulseless around 4:45 AM.  Patient was found to have gone into VT/VF cardiac arrest in setting of low EF 30 to 35%.  Patient has been successfully resuscitated.  Patient underwent left and right heart cardiac catheterization afterwards.   1/1- did well with MBS- diet order placed 1/3- cortrack placed with tube feeds

## 2022-03-20 NOTE — Progress Notes (Signed)
Mobility Specialist - Progress Note   03/20/22 1004  Mobility  Activity Ambulated with assistance in room;Transferred from bed to chair  Level of Assistance Moderate assist, patient does 50-74%  Assistive Device Front wheel walker  Distance Ambulated (ft) 2 ft  Activity Response Tolerated well  Mobility Referral Yes  $Mobility charge 1 Mobility    Pt received in bed requesting to get to recliner to eat breakfast. ModA to stand and pivot. Left in recliner w/ RN at his side.   [place] Specialist Please contact via SecureChat or Rehab office at [phone]

## 2022-03-20 NOTE — Progress Notes (Signed)
PROGRESS NOTE    William Barron  VOH:[id number] DOB: February 15, 1946 DOA: 03/05/2022 PCP: [name], MD   Brief Narrative: Patient is a 77 year old male with history of pancreatic CA status post Whipple, prostate CA, multiple myeloma, amyloidosis, dyslipidemia, DM, presented to ED with right-sided headache and blurred vision.  Last known well was in the evening of 03/04/2022.  He woke up on the morning of admission on 12/22 with difficulty seeing out of his right eye.  He went to see an ophthalmologist and was referred to ER CT head showed acute/subacute right occipital CVA   03/11/2022: Patient was found unresponsive and pulseless around 4:45 AM.  Patient was found to have gone into VT/VF cardiac arrest in setting of low EF 30 to 35%.  Patient has been successfully resuscitated.  Patient underwent left and right heart cardiac catheterization afterwards.   1/1- did well with MBS- diet order placed 1/3- cortrack placed with tube feeds   Assessment and Plan:  Acute right PCA stroke Symptoms of not being able to see half of a clock. CT head showed evidence of right PCA territory stroke. MRI confirms acute/early subacute right PCA territory infarct with associated mild petechial blood products without frank hemorrhagic transformation. CTA head and neck significant for suspected high-grade stenosis versus occlusion of distal right PCA. LDL of 66. Hemoglobin A1C of 6.5%. Transthoracic Echocardiogram significant for LVEF of 35-40% with global hypokinesis and no atrial level shunt. Neurology recommendations for DAPT for three months secondary to intracranial stenosis/occlusion, followed by aspirin alone and loop recorder prior to discharge to CIR. PT/OT recommendations for acute inpatient rehabilitation. Recommendation for Neurology follow-up in 4 weeks. -Loop recorder prior to discharge to inpatient rehab  Acute HFrEF No prior history of reduced EF. Cardiology consulted. Transthoracic  Echocardiogram significant for LVEF of 30-35% with normal RV function. Left/right heart catheterization significant for moderate CAD but per cardiology, did not explain reduction in LVEF. Cardiac MRI confirmed an LVEF of 33% with diffuse hypokinesis in addition to RVEF of 46%. Although patient has a history of AL amyloidosis, per cardiology, findings are not consistent with cardiac involvement of AL amyloidosis. -Continue Entresto, Farxiga, spironolactone -Continue Crestor  VT arrest Patient required 1 minute of BLS/ACLS protocol before achieving ROSC. Patient started on amiodarone. EP consulted and recommended a Lifevest at discharge with consideration of ICD at a later time.  Influenza A infection Patient completed oseltamivir course.  Bibasilar lung infiltrates Noted on imaging. Patient started empirically on antibiotics for possible infection  Right CN VI palsy Chronic. Stable.  Oral thrush Improved with oral nystatin -Continue oral nystatin -Concern from SLP for possible proximal esophageal stricture; will consider barium swallow  Dysphagia -SLP recommendations (1/3): Dysphagia 2 (fine chop);Thin liquid Liquids provided via: Cup;Straw Medication Administration: Crushed with puree Supervision: Staff to assist with self feeding Compensations: Slow rate;Small sips/bites  Hypokalemia Resolved with repletion  GERD -Continue Protonix  Diabetes mellitus type 2 -Continue SSI  History of pancreatic cancer S/p Whipple procedure. Follows at [facility] 72,000 units  History of AL amyloidosis Multiple myeloma Patient follows with oncology, [name] at [facility]  History of prostate cancer Patient follows with urology. History of XRT.  Right parotid gland lesion Noted incidentally on MRI imaging. CTA significant for a 1.6 cm calcified lesion of the posterior aspect of right parotid gland and associated smaller lesions. Recommendation  for outpatient ENT follow-up.   DVT prophylaxis: Heparin subq Code Status:   Code Status: DNR Family  Communication: None at bedside Disposition Plan: Discharge to acute inpatient rehabilitation pending bed availability   Consultants:  PCCM Cardiology Neurology Electrophysiology Palliative car medicine  Procedures:  Transthoracic Echocardiogram Left/right heart catheterization  Antimicrobials: Augmentin Oseltamivir    Subjective: No concerns this morning.  Objective: BP 100/60 (BP Location: Left Arm)   Pulse 83   Temp 98.1 F (36.7 C) (Oral)   Resp 20   Ht '5\' 5"'$  (1.651 m)   Wt 57.4 kg   SpO2 97%   BMI 21.06 kg/m   Examination:  General exam: Appears calm and comfortable Respiratory system: Respiratory effort normal. Cardiovascular system: S1 & S2 heard, RRR. Systolic murmur. Gastrointestinal system: Abdomen is nondistended, soft and nontender. Normal bowel sounds heard. Central nervous system: Alert. Musculoskeletal: No edema. No calf tenderness Skin: No cyanosis. No rashes Psychiatry: Judgement and insight appear normal. Mood & affect appropriate.    Data Reviewed: I have personally reviewed following labs and imaging studies  CBC Lab Results  Component Value Date   WBC 5.8 03/18/2022   RBC 4.11 (L) 03/18/2022   HGB 9.9 (L) 03/18/2022   HCT 31.7 (L) 03/18/2022   MCV 77.1 (L) 03/18/2022   MCH 24.1 (L) 03/18/2022   PLT 461 (H) 03/18/2022   MCHC 31.2 03/18/2022   RDW 18.6 (H) 03/18/2022   LYMPHSABS 0.6 (L) 03/15/2022   MONOABS 0.2 03/15/2022   EOSABS 0.0 03/15/2022   BASOSABS 0.0 95/62/1308     Last metabolic panel Lab Results  Component Value Date   NA 139 03/19/2022   K 4.1 03/19/2022   CL 111 03/19/2022   CO2 20 (L) 03/19/2022   BUN 22 03/19/2022   CREATININE 1.22 03/19/2022   GLUCOSE 162 (H) 03/19/2022   GFRNONAA >60 03/19/2022   GFRAA >60 07/13/2018   CALCIUM 8.2 (L) 03/19/2022   PHOS 3.2 03/20/2022   PROT 5.6 (L) 03/06/2022    ALBUMIN 2.3 (L) 03/15/2022   LABGLOB 2.7 06/21/2018   AGRATIO 1.3 06/21/2018   BILITOT 0.5 03/06/2022   ALKPHOS 564 (H) 03/06/2022   AST 28 03/06/2022   ALT 26 03/06/2022   ANIONGAP 8 03/19/2022    GFR: Estimated Creatinine Clearance: 40.5 mL/min (by C-G formula based on SCr of 1.22 mg/dL).  Recent Results (from the past 240 hour(s))  Culture, blood (Routine X 2) w Reflex to ID Panel     Status: None   Collection Time: 03/13/22 12:57 PM   Specimen: BLOOD LEFT ARM  Result Value Ref Range Status   Specimen Description BLOOD LEFT ARM  Final   Special Requests   Final    BOTTLES DRAWN AEROBIC AND ANAEROBIC Blood Culture results may not be optimal due to an inadequate volume of blood received in culture bottles   Culture   Final    NO GROWTH 5 DAYS Performed at [facility], [place] [address] [place], [place] [address]    Report Status 03/18/2022 FINAL  Final  Culture, blood (Routine X 2) w Reflex to ID Panel     Status: None   Collection Time: 03/13/22 12:57 PM   Specimen: BLOOD LEFT ARM  Result Value Ref Range Status   Specimen Description BLOOD LEFT ARM  Final   Special Requests   Final    BOTTLES DRAWN AEROBIC AND ANAEROBIC Blood Culture results may not be optimal due to an inadequate volume of blood received in culture bottles   Culture   Final    NO GROWTH 5 DAYS Performed at [facility], [place] [address] [place], [place] [address]    Report Status 03/18/2022 FINAL  Final  MRSA Next Gen by PCR, Nasal     Status: Abnormal   Collection Time: 03/13/22  2:34 PM   Specimen: Nasal Mucosa; Nasal Swab  Result Value Ref Range Status   MRSA by PCR Next Gen DETECTED (A) NOT DETECTED Final    Comment: RESULT CALLED TO, READ BACK BY AND VERIFIED WITH:  Langston Masker, RN 03/13/22 1724 A. [place] (NOTE) The GeneXpert MRSA Assay (FDA approved for NASAL specimens only), is one component of a comprehensive MRSA colonization surveillance program. It is not intended to diagnose  MRSA infection nor to guide or monitor treatment for MRSA infections. Test performance is not FDA approved in patients less than 54 years old. Performed at [facility], [place] [address] [place], [place] [address]       Radiology Studies: No results found.    LOS: 14 days    Cordelia Poche, MD [facility] 03/20/2022, 9:14 AM   If 7PM-7AM, please contact night-coverage [email or web address]

## 2022-03-21 DIAGNOSIS — Z90410 Acquired total absence of pancreas: Secondary | ICD-10-CM | POA: Diagnosis not present

## 2022-03-21 DIAGNOSIS — E119 Type 2 diabetes mellitus without complications: Secondary | ICD-10-CM | POA: Diagnosis not present

## 2022-03-21 DIAGNOSIS — I639 Cerebral infarction, unspecified: Secondary | ICD-10-CM | POA: Diagnosis not present

## 2022-03-21 DIAGNOSIS — E8581 Light chain (AL) amyloidosis: Secondary | ICD-10-CM | POA: Diagnosis not present

## 2022-03-21 LAB — GLUCOSE, CAPILLARY
Glucose-Capillary: 240 mg/dL — ABNORMAL HIGH (ref 70–99)
Glucose-Capillary: 226 mg/dL — ABNORMAL HIGH (ref 70–99)
Glucose-Capillary: 227 mg/dL — ABNORMAL HIGH (ref 70–99)
Glucose-Capillary: 128 mg/dL — ABNORMAL HIGH (ref 70–99)
Glucose-Capillary: 141 mg/dL — ABNORMAL HIGH (ref 70–99)

## 2022-03-21 MED ORDER — TESTOSTERONE 50 MG/5GM (1%) TD GEL: 5.0000 g | Freq: Every day | TRANSDERMAL | Status: DC

## 2022-03-21 MED ORDER — INSULIN GLARGINE-YFGN 100 UNIT/ML ~~LOC~~ SOLN
15.0000 [IU] | Freq: Every day | SUBCUTANEOUS | Status: DC
  Administered 2022-03-22: 15 [IU] via SUBCUTANEOUS
  Filled 2022-03-21: qty 0.15

## 2022-03-21 MED ORDER — TESTOSTERONE 20.25 MG/ACT (1.62%) TD GEL: 4.0000 | Freq: Every day | TRANSDERMAL | Status: DC

## 2022-03-21 MED ORDER — INSULIN GLARGINE-YFGN 100 UNIT/ML ~~LOC~~ SOLN: 20.0000 [IU] | Freq: Every day | SUBCUTANEOUS | Status: DC

## 2022-03-21 NOTE — Progress Notes (Signed)
PROGRESS NOTE    William Barron  XNA:[id number] DOB: [birth year] DOA: 03/05/2022 PCP: [name], MD   Brief Narrative: Patient is a 77 year old male with history of pancreatic CA status post Whipple, prostate CA, multiple myeloma, amyloidosis, dyslipidemia, DM, presented to ED with right-sided headache and blurred vision.  Last known well was in the evening of 03/04/2022.  He woke up on the morning of admission on 12/22 with difficulty seeing out of his right eye.  He went to see an ophthalmologist and was referred to ER CT head showed acute/subacute right occipital CVA   03/11/2022: Patient was found unresponsive and pulseless around 4:45 AM.  Patient was found to have gone into VT/VF cardiac arrest in setting of low EF 30 to 35%.  Patient has been successfully resuscitated.  Patient underwent left and right heart cardiac catheterization afterwards.   1/1- did well with MBS- diet order placed 1/3- cortrack placed with tube feeds   Assessment and Plan:  Acute right PCA stroke Symptoms of not being able to see half of a clock. CT head showed evidence of right PCA territory stroke. MRI confirms acute/early subacute right PCA territory infarct with associated mild petechial blood products without frank hemorrhagic transformation. CTA head and neck significant for suspected high-grade stenosis versus occlusion of distal right PCA. LDL of 66. Hemoglobin A1C of 6.5%. Transthoracic Echocardiogram significant for LVEF of 35-40% with global hypokinesis and no atrial level shunt. Neurology recommendations for DAPT for three months secondary to intracranial stenosis/occlusion, followed by aspirin alone and loop recorder prior to discharge to CIR. PT/OT recommendations for acute inpatient rehabilitation. Recommendation for Neurology follow-up in 4 weeks. -Loop recorder prior to discharge to inpatient rehab  Acute HFrEF No prior history of reduced EF. Cardiology consulted. Transthoracic  Echocardiogram significant for LVEF of 30-35% with normal RV function. Left/right heart catheterization significant for moderate CAD but per cardiology, did not explain reduction in LVEF. Cardiac MRI confirmed an LVEF of 33% with diffuse hypokinesis in addition to RVEF of 46%. Although patient has a history of AL amyloidosis, per cardiology, findings are not consistent with cardiac involvement of AL amyloidosis. -Continue Entresto, Farxiga, spironolactone -Continue Crestor  Ventricular tachycardia arrest Patient required 1 minute of BLS/ACLS protocol before achieving ROSC. Patient started on amiodarone. EP consulted and recommended a Lifevest at discharge with consideration of ICD at a later time.  Influenza A infection Patient completed oseltamivir course.  Bibasilar lung infiltrates Noted on imaging. Patient started empirically on antibiotics for possible infection  Right CN VI palsy Chronic. Stable.  Oral thrush Improved with oral nystatin -Continue oral nystatin -Concern from SLP for possible proximal esophageal stricture; will consider barium swallow  Dysphagia -Barium swallow -SLP recommendations (1/3): Dysphagia 2 (fine chop);Thin liquid Liquids provided via: Cup;Straw Medication Administration: Crushed with puree Supervision: Staff to assist with self feeding Compensations: Slow rate;Small sips/bites  Hypokalemia Resolved with repletion  GERD -Continue Protonix  Diabetes mellitus type 2 -Continue SSI -Increase to Semglee 15 units daily and continue Novolog TID with meals  History of pancreatic lesion S/p Whipple procedure but pathology negative for pancreatic cancer and significant for intraductal papillary mucinous neoplasm. Follows at [facility] 72,000 units  History of AL amyloidosis Multiple myeloma Patient follows with oncology, [name] at [facility]  History of prostate cancer Patient follows with urology. History  of XRT.  Right parotid gland lesion Noted incidentally on MRI imaging. CTA significant for a 1.6 cm calcified lesion of the posterior  aspect of right parotid gland and associated smaller lesions. Recommendation for outpatient ENT follow-up.  History of orchiectomy -Restart home testosterone   DVT prophylaxis: Heparin subq Code Status:   Code Status: DNR Family Communication: Son at bedside Disposition Plan: Discharge to acute inpatient rehabilitation pending bed availability   Consultants:  PCCM Cardiology Neurology Electrophysiology Palliative car medicine  Procedures:  Transthoracic Echocardiogram Left/right heart catheterization  Antimicrobials: Augmentin Oseltamivir    Subjective: No concerns from patient this morning.  Objective: BP (!) 101/56 (BP Location: Left Arm)   Pulse 83   Temp 98 F (36.7 C) (Oral)   Resp 20   Ht '5\' 5"'$  (1.651 m)   Wt 55.7 kg   SpO2 97%   BMI 20.43 kg/m   Examination:  General exam: Appears calm and comfortable Respiratory system: Clear to auscultation. Respiratory effort normal. Cardiovascular system: S1 & S2 heard, Normal rate with regular rhythm. Gastrointestinal system: Abdomen is nondistended, soft and nontender. Normal bowel sounds heard. Central nervous system: Alert and oriented. Skin: No cyanosis. No rashes Psychiatry: Judgement and insight appear normal. Mood & affect appropriate.    Data Reviewed: I have personally reviewed following labs and imaging studies  CBC Lab Results  Component Value Date   WBC 5.8 03/18/2022   RBC 4.11 (L) 03/18/2022   HGB 9.9 (L) 03/18/2022   HCT 31.7 (L) 03/18/2022   MCV 77.1 (L) 03/18/2022   MCH 24.1 (L) 03/18/2022   PLT 461 (H) 03/18/2022   MCHC 31.2 03/18/2022   RDW 18.6 (H) 03/18/2022   LYMPHSABS 0.6 (L) 03/15/2022   MONOABS 0.2 03/15/2022   EOSABS 0.0 03/15/2022   BASOSABS 0.0 45/62/5638     Last metabolic panel Lab Results  Component Value Date   NA 139 03/19/2022    K 4.1 03/19/2022   CL 111 03/19/2022   CO2 20 (L) 03/19/2022   BUN 22 03/19/2022   CREATININE 1.22 03/19/2022   GLUCOSE 162 (H) 03/19/2022   GFRNONAA >60 03/19/2022   GFRAA >60 07/13/2018   CALCIUM 8.2 (L) 03/19/2022   PHOS 3.2 03/20/2022   PROT 5.6 (L) 03/06/2022   ALBUMIN 2.3 (L) 03/15/2022   LABGLOB 2.7 06/21/2018   AGRATIO 1.3 06/21/2018   BILITOT 0.5 03/06/2022   ALKPHOS 564 (H) 03/06/2022   AST 28 03/06/2022   ALT 26 03/06/2022   ANIONGAP 8 03/19/2022    GFR: Estimated Creatinine Clearance: 39.3 mL/min (by C-G formula based on SCr of 1.22 mg/dL).  Recent Results (from the past 240 hour(s))  Culture, blood (Routine X 2) w Reflex to ID Panel     Status: None   Collection Time: 03/13/22 12:57 PM   Specimen: BLOOD LEFT ARM  Result Value Ref Range Status   Specimen Description BLOOD LEFT ARM  Final   Special Requests   Final    BOTTLES DRAWN AEROBIC AND ANAEROBIC Blood Culture results may not be optimal due to an inadequate volume of blood received in culture bottles   Culture   Final    NO GROWTH 5 DAYS Performed at [facility], [place] [address], [place], [place] [address]    Report Status 03/18/2022 FINAL  Final  Culture, blood (Routine X 2) w Reflex to ID Panel     Status: None   Collection Time: 03/13/22 12:57 PM   Specimen: BLOOD LEFT ARM  Result Value Ref Range Status   Specimen Description BLOOD LEFT ARM  Final   Special Requests   Final    BOTTLES  DRAWN AEROBIC AND ANAEROBIC Blood Culture results may not be optimal due to an inadequate volume of blood received in culture bottles   Culture   Final    NO GROWTH 5 DAYS Performed at [facility], [place] [address] [place], [place] [address]    Report Status 03/18/2022 FINAL  Final  MRSA Next Gen by PCR, Nasal     Status: Abnormal   Collection Time: 03/13/22  2:34 PM   Specimen: Nasal Mucosa; Nasal Swab  Result Value Ref Range Status   MRSA by PCR Next Gen DETECTED (A) NOT DETECTED Final     Comment: RESULT CALLED TO, READ BACK BY AND VERIFIED WITH:  Langston Masker, RN 03/13/22 1724 A. LAFRANCE (NOTE) The GeneXpert MRSA Assay (FDA approved for NASAL specimens only), is one component of a comprehensive MRSA colonization surveillance program. It is not intended to diagnose MRSA infection nor to guide or monitor treatment for MRSA infections. Test performance is not FDA approved in patients less than 24 years old. Performed at [facility], [place] [address], [place], [place] [address]       Radiology Studies: No results found.    LOS: 15 days    Cordelia Poche, MD [facility] 03/21/2022, 10:04 AM   If 7PM-7AM, please contact night-coverage [email or web address]

## 2022-03-21 NOTE — Progress Notes (Signed)
Mobility Specialist - Progress Note   03/21/22 1023  Mobility  Activity Ambulated with assistance in room;Transferred from bed to chair  Level of Assistance Moderate assist, patient does 50-74%  Assistive Device Front wheel walker  Distance Ambulated (ft) 2 ft  Activity Response Tolerated well  Mobility Referral Yes  $Mobility charge 1 Mobility    Pt received in bed requesting to sit in recliner. ModA to stand and pivot. Left in recliner w/ RN present.   [place] Specialist Please contact via SecureChat or Rehab office at [phone]

## 2022-03-21 NOTE — Progress Notes (Signed)
Pt's spouse [name] and son [name] at bedside; both are very attentive to pt needs and are requesting for MD to please call either one for update at MD's earliest convenience if neither are at bedside when MD rounds on pt. Their numbers are listed below for your convenience:   Aadon Gorelik: [phone] Elijahjames Fuelling: [phone]  Pt is also questioning why he has not received his testosterone gel that he takes at home; Pt is expressing concern that he will suffer from not taking it for an extended period of time and would like to know if he can restart this medication.

## 2022-03-22 ENCOUNTER — Inpatient Hospital Stay (HOSPITAL_COMMUNITY): Payer: No Typology Code available for payment source

## 2022-03-22 ENCOUNTER — Inpatient Hospital Stay (HOSPITAL_COMMUNITY)
Admission: RE | Admit: 2022-03-22 | Discharge: 2022-04-02 | DRG: 056 | Disposition: A | Discharge status: Hospice | Payer: No Typology Code available for payment source | Source: Intra-hospital | Attending: Physical Medicine & Rehabilitation | Admitting: Physical Medicine & Rehabilitation

## 2022-03-22 ENCOUNTER — Encounter (HOSPITAL_COMMUNITY): Payer: Self-pay | Admitting: Physical Medicine & Rehabilitation

## 2022-03-22 ENCOUNTER — Ambulatory Visit: Payer: Medicare Other | Admitting: General Practice

## 2022-03-22 DIAGNOSIS — K921 Melena: Secondary | ICD-10-CM | POA: Diagnosis not present

## 2022-03-22 DIAGNOSIS — Z8042 Family history of malignant neoplasm of prostate: Secondary | ICD-10-CM

## 2022-03-22 DIAGNOSIS — Z923 Personal history of irradiation: Secondary | ICD-10-CM

## 2022-03-22 DIAGNOSIS — E785 Hyperlipidemia, unspecified: Secondary | ICD-10-CM | POA: Diagnosis present

## 2022-03-22 DIAGNOSIS — I69391 Dysphagia following cerebral infarction: Secondary | ICD-10-CM

## 2022-03-22 DIAGNOSIS — I951 Orthostatic hypotension: Secondary | ICD-10-CM | POA: Diagnosis not present

## 2022-03-22 DIAGNOSIS — H9190 Unspecified hearing loss, unspecified ear: Secondary | ICD-10-CM | POA: Diagnosis present

## 2022-03-22 DIAGNOSIS — I428 Other cardiomyopathies: Secondary | ICD-10-CM | POA: Diagnosis present

## 2022-03-22 DIAGNOSIS — T502X5A Adverse effect of carbonic-anhydrase inhibitors, benzothiadiazides and other diuretics, initial encounter: Secondary | ICD-10-CM | POA: Diagnosis not present

## 2022-03-22 DIAGNOSIS — C9000 Multiple myeloma not having achieved remission: Secondary | ICD-10-CM | POA: Diagnosis present

## 2022-03-22 DIAGNOSIS — H9193 Unspecified hearing loss, bilateral: Secondary | ICD-10-CM | POA: Diagnosis present

## 2022-03-22 DIAGNOSIS — Z974 Presence of external hearing-aid: Secondary | ICD-10-CM

## 2022-03-22 DIAGNOSIS — J690 Pneumonitis due to inhalation of food and vomit: Secondary | ICD-10-CM | POA: Diagnosis present

## 2022-03-22 DIAGNOSIS — E349 Endocrine disorder, unspecified: Secondary | ICD-10-CM | POA: Diagnosis not present

## 2022-03-22 DIAGNOSIS — E119 Type 2 diabetes mellitus without complications: Secondary | ICD-10-CM | POA: Diagnosis not present

## 2022-03-22 DIAGNOSIS — Z8507 Personal history of malignant neoplasm of pancreas: Secondary | ICD-10-CM

## 2022-03-22 DIAGNOSIS — E1142 Type 2 diabetes mellitus with diabetic polyneuropathy: Secondary | ICD-10-CM | POA: Diagnosis present

## 2022-03-22 DIAGNOSIS — R54 Age-related physical debility: Secondary | ICD-10-CM | POA: Diagnosis present

## 2022-03-22 DIAGNOSIS — Z66 Do not resuscitate: Secondary | ICD-10-CM | POA: Diagnosis present

## 2022-03-22 DIAGNOSIS — K219 Gastro-esophageal reflux disease without esophagitis: Secondary | ICD-10-CM | POA: Diagnosis present

## 2022-03-22 DIAGNOSIS — H4921 Sixth [abducent] nerve palsy, right eye: Secondary | ICD-10-CM | POA: Diagnosis present

## 2022-03-22 DIAGNOSIS — I639 Cerebral infarction, unspecified: Secondary | ICD-10-CM | POA: Diagnosis not present

## 2022-03-22 DIAGNOSIS — E869 Volume depletion, unspecified: Secondary | ICD-10-CM | POA: Diagnosis not present

## 2022-03-22 DIAGNOSIS — B370 Candidal stomatitis: Secondary | ICD-10-CM | POA: Diagnosis present

## 2022-03-22 DIAGNOSIS — I69398 Other sequelae of cerebral infarction: Secondary | ICD-10-CM | POA: Diagnosis not present

## 2022-03-22 DIAGNOSIS — I63531 Cerebral infarction due to unspecified occlusion or stenosis of right posterior cerebral artery: Secondary | ICD-10-CM

## 2022-03-22 DIAGNOSIS — Z794 Long term (current) use of insulin: Secondary | ICD-10-CM | POA: Diagnosis not present

## 2022-03-22 DIAGNOSIS — E43 Unspecified severe protein-calorie malnutrition: Secondary | ICD-10-CM | POA: Diagnosis present

## 2022-03-22 DIAGNOSIS — Z79899 Other long term (current) drug therapy: Secondary | ICD-10-CM

## 2022-03-22 DIAGNOSIS — Z681 Body mass index (BMI) 19 or less, adult: Secondary | ICD-10-CM

## 2022-03-22 DIAGNOSIS — I5022 Chronic systolic (congestive) heart failure: Secondary | ICD-10-CM | POA: Diagnosis not present

## 2022-03-22 DIAGNOSIS — Z87891 Personal history of nicotine dependence: Secondary | ICD-10-CM

## 2022-03-22 DIAGNOSIS — H532 Diplopia: Secondary | ICD-10-CM | POA: Diagnosis present

## 2022-03-22 DIAGNOSIS — I110 Hypertensive heart disease with heart failure: Secondary | ICD-10-CM | POA: Diagnosis present

## 2022-03-22 DIAGNOSIS — R627 Adult failure to thrive: Secondary | ICD-10-CM | POA: Diagnosis present

## 2022-03-22 DIAGNOSIS — H5340 Unspecified visual field defects: Secondary | ICD-10-CM | POA: Diagnosis present

## 2022-03-22 DIAGNOSIS — Z515 Encounter for palliative care: Secondary | ICD-10-CM | POA: Diagnosis not present

## 2022-03-22 DIAGNOSIS — Z993 Dependence on wheelchair: Secondary | ICD-10-CM

## 2022-03-22 DIAGNOSIS — Z800 Family history of malignant neoplasm of digestive organs: Secondary | ICD-10-CM

## 2022-03-22 DIAGNOSIS — M21371 Foot drop, right foot: Secondary | ICD-10-CM | POA: Diagnosis not present

## 2022-03-22 DIAGNOSIS — Z85828 Personal history of other malignant neoplasm of skin: Secondary | ICD-10-CM

## 2022-03-22 DIAGNOSIS — I9589 Other hypotension: Secondary | ICD-10-CM | POA: Diagnosis not present

## 2022-03-22 DIAGNOSIS — I5023 Acute on chronic systolic (congestive) heart failure: Secondary | ICD-10-CM

## 2022-03-22 DIAGNOSIS — I69354 Hemiplegia and hemiparesis following cerebral infarction affecting left non-dominant side: Secondary | ICD-10-CM | POA: Diagnosis present

## 2022-03-22 DIAGNOSIS — G5730 Lesion of lateral popliteal nerve, unspecified lower limb: Secondary | ICD-10-CM | POA: Diagnosis present

## 2022-03-22 DIAGNOSIS — Z8249 Family history of ischemic heart disease and other diseases of the circulatory system: Secondary | ICD-10-CM

## 2022-03-22 DIAGNOSIS — I2510 Atherosclerotic heart disease of native coronary artery without angina pectoris: Secondary | ICD-10-CM | POA: Diagnosis present

## 2022-03-22 DIAGNOSIS — D869 Sarcoidosis, unspecified: Secondary | ICD-10-CM | POA: Diagnosis present

## 2022-03-22 DIAGNOSIS — Z8546 Personal history of malignant neoplasm of prostate: Secondary | ICD-10-CM

## 2022-03-22 DIAGNOSIS — E8581 Light chain (AL) amyloidosis: Secondary | ICD-10-CM | POA: Diagnosis present

## 2022-03-22 LAB — BASIC METABOLIC PANEL
Anion gap: 7 (ref 5–15)
BUN: 33 mg/dL — ABNORMAL HIGH (ref 8–23)
CO2: 24 mmol/L (ref 22–32)
Calcium: 8.6 mg/dL — ABNORMAL LOW (ref 8.9–10.3)
Chloride: 108 mmol/L (ref 98–111)
Creatinine, Ser: 1.36 mg/dL — ABNORMAL HIGH (ref 0.61–1.24)
GFR, Estimated: 53 mL/min — ABNORMAL LOW (ref >60)
Glucose, Bld: 217 mg/dL — ABNORMAL HIGH (ref 70–99)
Potassium: 4.7 mmol/L (ref 3.5–5.1)
Sodium: 139 mmol/L (ref 135–145)

## 2022-03-22 LAB — CBC
HCT: 33 % — ABNORMAL LOW (ref 39.0–52.0)
Hemoglobin: 10.1 g/dL — ABNORMAL LOW (ref 13.0–17.0)
MCH: 23.7 pg — ABNORMAL LOW (ref 26.0–34.0)
MCHC: 30.6 g/dL (ref 30.0–36.0)
MCV: 77.5 fL — ABNORMAL LOW (ref 80.0–100.0)
Platelets: 632 10*3/uL — ABNORMAL HIGH (ref 150–400)
RBC: 4.26 MIL/uL (ref 4.22–5.81)
RDW: 18.6 % — ABNORMAL HIGH (ref 11.5–15.5)
WBC: 8.3 10*3/uL (ref 4.0–10.5)
nRBC: 0 % (ref 0.0–0.2)

## 2022-03-22 LAB — GLUCOSE, CAPILLARY
Glucose-Capillary: 286 mg/dL — ABNORMAL HIGH (ref 70–99)
Glucose-Capillary: 210 mg/dL — ABNORMAL HIGH (ref 70–99)
Glucose-Capillary: 279 mg/dL — ABNORMAL HIGH (ref 70–99)

## 2022-03-22 MED ORDER — AMIODARONE HCL 200 MG PO TABS
400.0000 mg | ORAL_TABLET | Freq: Every day | ORAL | Status: DC
  Administered 2022-03-26 – 2022-03-29 (×4): 400 mg
  Filled 2022-03-22 (×5): qty 2

## 2022-03-22 MED ORDER — PANCRELIPASE (LIP-PROT-AMYL) 36000-114000 UNITS PO CPEP
72000.0000 [IU] | ORAL_CAPSULE | Freq: Four times a day (QID) | ORAL | Status: DC
  Administered 2022-03-22 – 2022-03-29 (×21): 72000 [IU] via ORAL
  Filled 2022-03-22 (×30): qty 2

## 2022-03-22 MED ORDER — MAGNESIUM HYDROXIDE 400 MG/5ML PO SUSP: 30.0000 mL | Freq: Every day | ORAL | Status: DC | PRN

## 2022-03-22 MED ORDER — SPIRONOLACTONE 12.5 MG HALF TABLET
12.5000 mg | ORAL_TABLET | Freq: Every day | ORAL | Status: DC
  Administered 2022-03-22: 12.5 mg
  Filled 2022-03-22: qty 1

## 2022-03-22 MED ORDER — ROSUVASTATIN CALCIUM 5 MG PO TABS
10.0000 mg | ORAL_TABLET | Freq: Every day | ORAL | Status: DC
  Administered 2022-03-22 – 2022-03-28 (×7): 10 mg
  Filled 2022-03-22 (×7): qty 2

## 2022-03-22 MED ORDER — DICLOFENAC SODIUM 1 % EX GEL
2.0000 g | Freq: Four times a day (QID) | CUTANEOUS | Status: DC
  Administered 2022-03-22 – 2022-04-02 (×30): 2 g via TOPICAL
  Filled 2022-03-22: qty 100

## 2022-03-22 MED ORDER — GUAIFENESIN-DM 100-10 MG/5ML PO SYRP: 5.0000 mL | ORAL_SOLUTION | Freq: Four times a day (QID) | ORAL | Status: DC | PRN

## 2022-03-22 MED ORDER — SACUBITRIL-VALSARTAN 24-26 MG PO TABS
1.0000 | ORAL_TABLET | Freq: Two times a day (BID) | ORAL | Status: DC
  Administered 2022-03-22: 1
  Filled 2022-03-22: qty 1

## 2022-03-22 MED ORDER — AMIODARONE HCL 200 MG PO TABS
400.0000 mg | ORAL_TABLET | Freq: Two times a day (BID) | ORAL | Status: DC
  Administered 2022-03-22: 400 mg
  Filled 2022-03-22: qty 2

## 2022-03-22 MED ORDER — MEGESTROL ACETATE 400 MG/10ML PO SUSP
400.0000 mg | Freq: Every day | ORAL | Status: DC
  Administered 2022-03-23: 400 mg
  Filled 2022-03-22: qty 10

## 2022-03-22 MED ORDER — DAPAGLIFLOZIN PROPANEDIOL 10 MG PO TABS
10.0000 mg | ORAL_TABLET | Freq: Every day | ORAL | Status: DC
  Administered 2022-03-23 – 2022-03-27 (×5): 10 mg via ORAL
  Filled 2022-03-22 (×5): qty 1

## 2022-03-22 MED ORDER — ALUM & MAG HYDROXIDE-SIMETH 200-200-20 MG/5ML PO SUSP: 30.0000 mL | ORAL | Status: DC | PRN

## 2022-03-22 MED ORDER — MIRABEGRON ER 25 MG PO TB24
25.0000 mg | ORAL_TABLET | Freq: Every day | ORAL | Status: DC
  Administered 2022-03-23 – 2022-03-28 (×6): 25 mg via ORAL
  Filled 2022-03-22 (×8): qty 1

## 2022-03-22 MED ORDER — ASPIRIN 325 MG PO TABS
325.0000 mg | ORAL_TABLET | Freq: Every day | ORAL | Status: DC
  Administered 2022-03-22: 325 mg via ORAL
  Filled 2022-03-22: qty 1

## 2022-03-22 MED ORDER — JEVITY 1.5 CAL/FIBER PO LIQD
1000.0000 mL | ORAL | Status: DC
  Administered 2022-03-22: 1000 mL
  Filled 2022-03-22 (×2): qty 1000

## 2022-03-22 MED ORDER — PROSOURCE TF20 ENFIT COMPATIBL EN LIQD
60.0000 mL | Freq: Every day | ENTERAL | Status: DC
  Administered 2022-03-23 – 2022-03-29 (×7): 60 mL
  Filled 2022-03-22 (×8): qty 60

## 2022-03-22 MED ORDER — ROSUVASTATIN CALCIUM 5 MG PO TABS: 10.0000 mg | ORAL_TABLET | Freq: Every day | ORAL | Status: DC

## 2022-03-22 MED ORDER — PANTOPRAZOLE SODIUM 40 MG IV SOLR
40.0000 mg | Freq: Every day | INTRAVENOUS | Status: DC
  Administered 2022-03-22: 40 mg via INTRAVENOUS
  Filled 2022-03-22: qty 10

## 2022-03-22 MED ORDER — WHITE PETROLATUM EX OINT: TOPICAL_OINTMENT | CUTANEOUS | Status: DC | PRN

## 2022-03-22 MED ORDER — DIPHENHYDRAMINE HCL 12.5 MG/5ML PO ELIX: 12.5000 mg | ORAL_SOLUTION | Freq: Four times a day (QID) | ORAL | Status: DC | PRN

## 2022-03-22 MED ORDER — RISAQUAD PO CAPS
1.0000 | ORAL_CAPSULE | Freq: Every day | ORAL | Status: DC
  Administered 2022-03-23 – 2022-03-29 (×7): 1 via ORAL
  Filled 2022-03-22 (×8): qty 1

## 2022-03-22 MED ORDER — AMIODARONE HCL 400 MG PO TABS: ORAL_TABLET | ORAL | Status: DC

## 2022-03-22 MED ORDER — MEGESTROL ACETATE 400 MG/10ML PO SUSP: 400.0000 mg | Freq: Every day | ORAL | Status: DC

## 2022-03-22 MED ORDER — LIDOCAINE 5 % EX PTCH
1.0000 | MEDICATED_PATCH | CUTANEOUS | Status: DC
  Administered 2022-03-24 – 2022-03-29 (×6): 1 via TRANSDERMAL
  Filled 2022-03-22 (×8): qty 1

## 2022-03-22 MED ORDER — PROCHLORPERAZINE 25 MG RE SUPP: 12.5000 mg | Freq: Four times a day (QID) | RECTAL | Status: DC | PRN

## 2022-03-22 MED ORDER — SORBITOL 70 % SOLN
30.0000 mL | Freq: Every day | Status: DC | PRN
  Filled 2022-03-22: qty 30

## 2022-03-22 MED ORDER — TRAZODONE HCL 50 MG PO TABS: 25.0000 mg | ORAL_TABLET | Freq: Every evening | ORAL | Status: DC | PRN

## 2022-03-22 MED ORDER — NYSTATIN 100000 UNIT/ML MT SUSP: 5.0000 mL | Freq: Four times a day (QID) | OROMUCOSAL | Status: DC

## 2022-03-22 MED ORDER — CLOPIDOGREL BISULFATE 75 MG PO TABS
75.0000 mg | ORAL_TABLET | Freq: Every day | ORAL | Status: DC
  Administered 2022-03-23 – 2022-03-29 (×7): 75 mg
  Filled 2022-03-22 (×8): qty 1

## 2022-03-22 MED ORDER — TESTOSTERONE 50 MG/5GM (1%) TD GEL
5.0000 g | Freq: Every day | TRANSDERMAL | Status: DC
  Administered 2022-03-23 – 2022-03-24 (×2): 5 g via TRANSDERMAL
  Filled 2022-03-22 (×3): qty 5

## 2022-03-22 MED ORDER — AMIODARONE HCL 200 MG PO TABS
400.0000 mg | ORAL_TABLET | Freq: Two times a day (BID) | ORAL | Status: AC
  Administered 2022-03-22 – 2022-03-25 (×6): 400 mg
  Filled 2022-03-22 (×6): qty 2

## 2022-03-22 MED ORDER — SACUBITRIL-VALSARTAN 24-26 MG PO TABS
1.0000 | ORAL_TABLET | Freq: Two times a day (BID) | ORAL | Status: DC
  Administered 2022-03-22 – 2022-03-27 (×10): 1
  Filled 2022-03-22 (×10): qty 1

## 2022-03-22 MED ORDER — HEPARIN SODIUM (PORCINE) 5000 UNIT/ML IJ SOLN
5000.0000 [IU] | Freq: Three times a day (TID) | INTRAMUSCULAR | Status: DC
  Administered 2022-03-22 – 2022-03-29 (×20): 5000 [IU] via SUBCUTANEOUS
  Filled 2022-03-22 (×20): qty 1

## 2022-03-22 MED ORDER — PROSOURCE TF20 ENFIT COMPATIBL EN LIQD: 60.0000 mL | Freq: Every day | ENTERAL | Status: DC

## 2022-03-22 MED ORDER — FLEET ENEMA 7-19 GM/118ML RE ENEM: 1.0000 | ENEMA | Freq: Once | RECTAL | Status: DC | PRN

## 2022-03-22 MED ORDER — HEPARIN SODIUM (PORCINE) 5000 UNIT/ML IJ SOLN: 5000.0000 [IU] | Freq: Three times a day (TID) | INTRAMUSCULAR | Status: DC

## 2022-03-22 MED ORDER — PROCHLORPERAZINE EDISYLATE 10 MG/2ML IJ SOLN: 5.0000 mg | Freq: Four times a day (QID) | INTRAMUSCULAR | Status: DC | PRN

## 2022-03-22 MED ORDER — METHOCARBAMOL 500 MG PO TABS
500.0000 mg | ORAL_TABLET | Freq: Four times a day (QID) | ORAL | Status: DC | PRN
  Administered 2022-03-23 – 2022-03-26 (×2): 500 mg via ORAL
  Filled 2022-03-22: qty 1

## 2022-03-22 MED ORDER — INSULIN ASPART 100 UNIT/ML IJ SOLN
0.0000 [IU] | Freq: Three times a day (TID) | INTRAMUSCULAR | Status: DC
  Administered 2022-03-23 (×3): 2 [IU] via SUBCUTANEOUS
  Administered 2022-03-24: 9 [IU] via SUBCUTANEOUS
  Administered 2022-03-24: 7 [IU] via SUBCUTANEOUS

## 2022-03-22 MED ORDER — DICLOFENAC SODIUM 1 % EX GEL: 2.0000 g | Freq: Four times a day (QID) | CUTANEOUS | Status: DC

## 2022-03-22 MED ORDER — ASPIRIN 325 MG PO TABS: 325.0000 mg | ORAL_TABLET | Freq: Every day | ORAL | Status: DC

## 2022-03-22 MED ORDER — ROSUVASTATIN CALCIUM 10 MG PO TABS: 10.0000 mg | ORAL_TABLET | Freq: Every day | ORAL | Status: DC

## 2022-03-22 MED ORDER — JEVITY 1.5 CAL/FIBER PO LIQD: 1000.0000 mL | ORAL | Status: DC

## 2022-03-22 MED ORDER — ACETAMINOPHEN 325 MG PO TABS: 650.0000 mg | ORAL_TABLET | ORAL | Status: DC | PRN

## 2022-03-22 MED ORDER — INSULIN ASPART 100 UNIT/ML IJ SOLN
5.0000 [IU] | Freq: Three times a day (TID) | INTRAMUSCULAR | Status: DC
  Administered 2022-03-23 – 2022-03-29 (×15): 5 [IU] via SUBCUTANEOUS

## 2022-03-22 MED ORDER — FAMOTIDINE 40 MG/5ML PO SUSR
20.0000 mg | Freq: Every day | ORAL | Status: DC
  Filled 2022-03-22 (×2): qty 2.5

## 2022-03-22 MED ORDER — SPIRONOLACTONE 12.5 MG HALF TABLET
12.5000 mg | ORAL_TABLET | Freq: Every day | ORAL | Status: DC
  Administered 2022-03-23 – 2022-03-27 (×5): 12.5 mg
  Filled 2022-03-22 (×5): qty 1

## 2022-03-22 MED ORDER — ORAL CARE MOUTH RINSE
15.0000 mL | OROMUCOSAL | Status: DC
  Administered 2022-03-22: 15 mL via OROMUCOSAL

## 2022-03-22 MED ORDER — NYSTATIN 100000 UNIT/ML MT SUSP
5.0000 mL | Freq: Four times a day (QID) | OROMUCOSAL | Status: AC
  Administered 2022-03-22 – 2022-03-26 (×14): 500000 [IU] via ORAL
  Filled 2022-03-22 (×16): qty 5

## 2022-03-22 MED ORDER — SPIRONOLACTONE 25 MG PO TABS: 12.5000 mg | ORAL_TABLET | Freq: Every day | ORAL | Status: DC

## 2022-03-22 MED ORDER — NYSTATIN 100000 UNIT/ML MT SUSP
5.0000 mL | Freq: Four times a day (QID) | OROMUCOSAL | Status: DC
  Administered 2022-03-22: 500000 [IU]
  Filled 2022-03-22: qty 5

## 2022-03-22 MED ORDER — CLOPIDOGREL BISULFATE 75 MG PO TABS: 75.0000 mg | ORAL_TABLET | Freq: Every day | ORAL | Status: DC

## 2022-03-22 MED ORDER — ACETAMINOPHEN 325 MG PO TABS
325.0000 mg | ORAL_TABLET | ORAL | Status: DC | PRN
  Administered 2022-03-26: 650 mg via ORAL
  Filled 2022-03-22: qty 2

## 2022-03-22 MED ORDER — PROCHLORPERAZINE MALEATE 5 MG PO TABS: 5.0000 mg | ORAL_TABLET | Freq: Four times a day (QID) | ORAL | Status: DC | PRN

## 2022-03-22 MED ORDER — ACETAMINOPHEN 650 MG RE SUPP: 650.0000 mg | RECTAL | Status: DC | PRN

## 2022-03-22 MED ORDER — MEGESTROL ACETATE 400 MG/10ML PO SUSP: 400.0000 mg | Freq: Every day | ORAL | 0 refills | Status: DC

## 2022-03-22 MED ORDER — ASPIRIN 325 MG PO TABS
325.0000 mg | ORAL_TABLET | Freq: Every day | ORAL | Status: DC
  Administered 2022-03-23: 325 mg via ORAL
  Filled 2022-03-22: qty 1

## 2022-03-22 MED ORDER — ORAL CARE MOUTH RINSE: 15.0000 mL | OROMUCOSAL | Status: DC | PRN

## 2022-03-22 MED ORDER — SACUBITRIL-VALSARTAN 24-26 MG PO TABS: 1.0000 | ORAL_TABLET | Freq: Two times a day (BID) | ORAL | Status: DC

## 2022-03-22 MED ORDER — DAPAGLIFLOZIN PROPANEDIOL 10 MG PO TABS: 10.0000 mg | ORAL_TABLET | Freq: Every day | ORAL | Status: DC

## 2022-03-22 MED ORDER — AMIODARONE HCL 200 MG PO TABS: 400.0000 mg | ORAL_TABLET | Freq: Every day | ORAL | Status: DC

## 2022-03-22 MED ORDER — CLOPIDOGREL BISULFATE 75 MG PO TABS
75.0000 mg | ORAL_TABLET | Freq: Every day | ORAL | Status: DC
  Administered 2022-03-22: 75 mg
  Filled 2022-03-22: qty 1

## 2022-03-22 MED ORDER — INSULIN GLARGINE-YFGN 100 UNIT/ML ~~LOC~~ SOLN
15.0000 [IU] | Freq: Every day | SUBCUTANEOUS | Status: DC
  Administered 2022-03-23: 15 [IU] via SUBCUTANEOUS
  Filled 2022-03-22 (×2): qty 0.15

## 2022-03-22 NOTE — Discharge Summary (Signed)
Physician Discharge Summary   Patient: William Barron MRN: [id number] DOB: 06-08-1945  Admit date:     03/05/2022  Discharge date: 03/22/22  Discharge Physician: Cordelia Poche, MD   PCP: [name], MD   Recommendations at discharge:  Follow-up with Neurology for stroke Follow-up with Cardiology for cardiomyopathy Follow-up with ENT for right parotid lesion Follow-up with GI for possible upper endoscopy to evaluate on-going dysphagia if necessary  Discharge Diagnoses: Principal Problem:   Occipital stroke ([place]) Active Problems:   Amyloidosis ([place])   Multiple myeloma ([place])   Dyslipidemia   H/O Whipple procedure   Insulin-requiring or dependent type II diabetes mellitus ([place])   Lesion of parotid gland   Acute systolic CHF (congestive heart failure) ([place])   Influenza A with pneumonia   Cardiac arrest ([place])  Resolved Problems:   * No resolved hospital problems. [facility] Course: Patient is a 77 year old male with history of pancreatic CA status post Whipple, prostate CA, multiple myeloma, amyloidosis, dyslipidemia, DM, presented to ED with right-sided headache and blurred vision.  Last known well was in the evening of 03/04/2022.  He woke up on the morning of admission on 12/22 with difficulty seeing out of his right eye.  He went to see an ophthalmologist and was referred to ER CT head showed acute/subacute right occipital CVA   03/11/2022: Patient was found unresponsive and pulseless around 4:45 AM.  Patient was found to have gone into VT/VF cardiac arrest in setting of low EF 30 to 35%.  Patient has been successfully resuscitated.  Patient underwent left and right heart cardiac catheterization afterwards.   1/1- did well with MBS- diet order placed 1/3- cortrack placed with tube feeds  Assessment and Plan:  Acute right PCA stroke Symptoms of not being able to see half of a clock. CT head showed evidence of right PCA territory stroke. MRI confirms acute/early  subacute right PCA territory infarct with associated mild petechial blood products without frank hemorrhagic transformation. CTA head and neck significant for suspected high-grade stenosis versus occlusion of distal right PCA. LDL of 66. Hemoglobin A1C of 6.5%. Transthoracic Echocardiogram significant for LVEF of 35-40% with global hypokinesis and no atrial level shunt. Neurology recommendations for DAPT for three months secondary to intracranial stenosis/occlusion, followed by aspirin alone and loop recorder prior to discharge to CIR. PT/OT recommendations for acute inpatient rehabilitation. Recommendation for Neurology follow-up in 4 weeks. Loop recorder deferred per cardiology recommendations.   Acute HFrEF No prior history of reduced EF. Cardiology consulted. Transthoracic Echocardiogram significant for LVEF of 30-35% with normal RV function. Left/right heart catheterization significant for moderate CAD but per cardiology, did not explain reduction in LVEF. Cardiac MRI confirmed an LVEF of 33% with diffuse hypokinesis in addition to RVEF of 46%. Although patient has a history of AL amyloidosis, per cardiology, findings are not consistent with cardiac involvement of AL amyloidosis. Cardiology recommending Lisabeth Register, spironolactone on discharge. Continue Crestor.   Ventricular tachycardia arrest Patient required 1 minute of BLS/ACLS protocol before achieving ROSC. Patient started on amiodarone. EP consulted and recommended a Lifevest at discharge with consideration of ICD at a later time.   Influenza A infection Patient completed oseltamivir course.   Bibasilar lung infiltrates Noted on imaging. Patient started empirically on antibiotics for possible infection and completed course.   Right CN VI palsy Chronic. Stable.   Oral thrush Improved with oral nystatin Continue nystatin on discharge for four more days.   Dysphagia Barium swallow with incomplete evaluation. Patient seen  by speech  therapy with recommendations for dysphagia 2 diet (complete recommendations below). Recommend outpatient GI follow-up for consideration of upper endoscopy evaluation. Patient with Cortrak placed. with recommendation for Jevity 1.5 CAL at 50 ml/hr (1200 mL goal daily volume)   Hypokalemia Resolved with repletion.   GERD Continue Protonix.   Diabetes mellitus type 2 Resume home regimen on discharge.   History of pancreatic lesion S/p Whipple procedure but pathology negative for pancreatic cancer and significant for intraductal papillary mucinous neoplasm. Follows at [facility]. Continue Creon 72,000 units   History of AL amyloidosis Multiple myeloma Patient follows with oncology, [name] at [facility]   History of prostate cancer Patient follows with urology. History of XRT.   Right parotid gland lesion Noted incidentally on MRI imaging. CTA significant for a 1.6 cm calcified lesion of the posterior aspect of right parotid gland and associated smaller lesions. Recommendation for outpatient ENT follow-up.   History of orchiectomy Continue home testosterone   Consultants: PCCM Cardiology Neurology Electrophysiology Palliative care medicine  Procedures performed:  Transthoracic Echocardiogram Left/right heart catheterization  Disposition:  Acute inpatient rehabilitation Diet recommendation:  Speech therapy recommendations: Dysphagia 2 (fine chop);Thin liquid Liquids provided via: Cup;Straw Medication Administration: Crushed with puree Supervision: Staff to assist with self feeding Compensations: Slow rate;Small sips/bites  Dietitian recommendations: Initiate Jevity 1.5 at 50 mL/hour (1200 mL goal daily volume) Provide PROSource TF20 60 mL once daily per tube Goal regimen provides: 1880 kcal, 97 grams of protein, 912 mL H2O daily  DISCHARGE MEDICATION: Allergies as of 03/22/2022   No Known Allergies      Medication List     TAKE these  medications    amiodarone 400 MG tablet Commonly known as: PACERONE Place 1 tablet (400 mg total) into feeding tube 2 (two) times daily for 4 days, THEN 1 tablet (400 mg total) daily. Start taking on: March 22, 2022   aspirin 325 MG tablet Take 1 tablet (325 mg total) by mouth daily.   B-12 PO Take 1 tablet by mouth in the morning.   BD Pen Needle Nano U/F 32G X 4 MM Misc Generic drug: Insulin Pen Needle USE AS DIRECTED WITH INSULIN PEN THREE TIMES DAILY   clopidogrel 75 MG tablet Commonly known as: PLAVIX Place 1 tablet (75 mg total) into feeding tube daily.   dapagliflozin propanediol 10 MG Tabs tablet Commonly known as: FARXIGA Take 1 tablet (10 mg total) by mouth daily.   diclofenac Sodium 1 % Gel Commonly known as: VOLTAREN Apply 2 g topically 4 (four) times daily.   feeding supplement (JEVITY 1.5 CAL/FIBER) Liqd Place 1,000 mLs into feeding tube continuous.   feeding supplement (PROSource TF20) liquid Place 60 mLs into feeding tube daily.   FreeStyle [place] 2 Reader [employer] Use as instructed to check blood sugars   FreeStyle Libre 3 Sensor Misc Place 1 sensor on the skin every 14 days. Use to check glucose continuously   Gemtesa 75 MG Tabs Generic drug: Vibegron Take 75 mg by mouth in the morning.   lipase/protease/amylase 36000 UNITS Cpep capsule Commonly known as: Creon Take 3 capsules (108,000 Units total) by mouth 3 (three) times daily with meals. May also take 1 capsule (36,000 Units total) as needed (with snacks). What changed: See the new instructions.   megestrol 400 MG/10ML suspension Commonly known as: MEGACE Place 10 mLs (400 mg total) into feeding tube daily. Start taking on: March 23, 2022   NovoLOG FlexPen 100 UNIT/ML FlexPen Generic drug:  insulin aspart Inject 8 Units into the skin 3 (three) times daily before meals.   nystatin 100000 UNIT/ML suspension Commonly known as: MYCOSTATIN Place 5 mLs (500,000 Units total) into feeding tube 4  (four) times daily for 4 days.   pantoprazole sodium 40 mg Commonly known as: PROTONIX Take 40 mg by mouth daily. What changed: when to take this   rosuvastatin 10 MG tablet Commonly known as: CRESTOR Place 1 tablet (10 mg total) into feeding tube daily.   sacubitril-valsartan 24-26 MG Commonly known as: ENTRESTO Take 1 tablet by mouth 2 (two) times daily.   spironolactone 25 MG tablet Commonly known as: ALDACTONE Place 0.5 tablets (12.5 mg total) into feeding tube daily.   Testosterone 20.25 MG/ACT (1.62%) Gel Apply 4 Pump topically at bedtime.   Tyler Aas FlexTouch 100 UNIT/ML FlexTouch Pen Generic drug: insulin degludec Inject 15 Units into the skin daily. What changed:  how much to take when to take this        Follow-up Information     Sater, Nanine Means, MD. Schedule an appointment as soon as possible for a visit in 1 month(s).   Specialty: Neurology Contact information: [place] [address] [place] Follow up on 04/09/2022.   Specialty: Cardiology Why: 04/09/22 at 10:00 AM  [facility] at [facility], [place] information: [address] [id number] [place]               Discharge Exam: BP (!) 95/51 (BP Location: Left Arm)   Pulse 76   Temp 97.7 F (36.5 C)   Resp 20   Ht '5\' 5"'$  (1.651 m)   Wt 55.7 kg   SpO2 99%   BMI 20.43 kg/m   General exam: Appears calm and comfortable Respiratory system: Clear to auscultation. Respiratory effort normal. Cardiovascular system: S1 & S2 heard, RRR. No murmurs, rubs, gallops or clicks. Gastrointestinal system: Abdomen is nondistended, soft and nontender. Normal bowel sounds heard. Psychiatry: Judgement and insight appear normal. Mood & affect appropriate.   Condition at discharge: stable  The results of significant diagnostics from  this hospitalization (including imaging, microbiology, ancillary and laboratory) are listed below for reference.   Imaging Studies: DG ESOPHAGUS W SINGLE CM (SOL OR THIN BA)  Result Date: 03/22/2022 CLINICAL DATA:  Patient with a history of right occipital CVA with dysphagia. EXAM: ESOPHAGUS/BARIUM SWALLOW/TABLET STUDY TECHNIQUE: Single contrast examination was performed using thin liquid barium. This exam was performed by Soyla Dryer, NP, and was supervised and interpreted by Dr. Annamaria Boots . FLUOROSCOPY: Radiation Exposure Index (as provided by the fluoroscopic device): 2.30 mGy Kerma COMPARISON:  DG swallow 03/15/2022. FINDINGS: Swallowing: Extreme delay in the oropharyngeal phase. Patient required multiple verbal prompts to swallow the barium. No vestibular penetration or aspiration seen. Pharynx: Unremarkable. Esophagus: Normal appearance. Esophageal motility: Within normal limits. Hiatal Hernia: None. Gastroesophageal reflux: None visualized. Ingested 60m barium tablet: Not given Other: None. IMPRESSION: Significantly limited study. Patient lethargic requiring extensive prompting to follow commands. Only one swallow was observed due to patient's limited ability to follow commands and his limited ability to swallow the barium. Read by: JSoyla Dryer NP Electronically Signed   By: MJerilynn Mages  Shick M.D.   On: 03/22/2022 11:23   DG Abd Portable 1V  Result Date: 03/17/2022 CLINICAL DATA:  Feeding tube placement EXAM: PORTABLE ABDOMEN - 1 VIEW COMPARISON:  None Available. FINDINGS: Limited x-ray of the chest and upper abdomen demonstrates Dobbhoff tube the tip overlying the midbody of the stomach. Left upper quadrant. Elsewhere there are patchy opacity seen in the right lung and left lung base. Gas is seen in nondilated loops of bowel in the upper abdomen with some contrast in the stomach and the colon. IMPRESSION: Limited x-ray for tube placement has the tip overlying the mid body of the stomach Electronically  Signed   By: Jill Side M.D.   On: 03/17/2022 14:27   DG SWALLOW FUNC SPEECH PATH  Result Date: 03/15/2022 Table formatting from the original result was not included. Objective Swallowing Evaluation: Type of Study: MBS-Modified Barium Swallow Study  Patient Details Name: CHIP CANEPA MRN: [id number] Date of Birth: [birth year] Today's Date: 03/15/2022 Time: SLP Start Time (ACUTE ONLY): 72 -SLP Stop Time (ACUTE ONLY): 4098 SLP Time Calculation (min) (ACUTE ONLY): 28 min Past Medical History: Past Medical History: Diagnosis Date  Amyloidosis ([place])   Diabetes mellitus without complication ([place])   Feeding tube dysfunction, initial encounter 08/12/2021  GERD (gastroesophageal reflux disease)   Hearing loss   Has hearing aids  History of kidney stones   Hyperlipidemia   Jejunostomy tube present ([place])   Multiple myeloma ([place])   and amylodosis   Nephrolithiasis   Occasional tremors   Pancreatic insufficiency   Pneumonia   Prostate cancer ([place])   Prostatitis   acute and chronic  Skin cancer  Past Surgical History: Past Surgical History: Procedure Laterality Date  APPENDECTOMY    CARDIAC CATHETERIZATION N/A 06/13/2015  Procedure: Left Heart Cath and Coronary Angiography;  Surgeon: Jettie Booze, MD;  Location: [place] [facility];  Service: Cardiovascular;  Laterality: N/A;  COLONOSCOPY  04/07/2000  ELBOW SURGERY  03/15/2001  right  ESOPHAGOGASTRODUODENOSCOPY (EGD) WITH PROPOFOL N/A 09/07/2021  Procedure: ESOPHAGOGASTRODUODENOSCOPY (EGD) WITH PROPOFOL;  Surgeon: Irene Shipper, MD;  Location: [place];  Service: Gastroenterology;  Laterality: N/A;  EXTRACORPOREAL SHOCK WAVE LITHOTRIPSY Left 07/17/2018  Procedure: EXTRACORPOREAL SHOCK WAVE LITHOTRIPSY (ESWL);  Surgeon: [name], MD;  Location: [facility];  Service: Urology;  Laterality: Left;  EXTRACORPOREAL SHOCK WAVE LITHOTRIPSY Right 12/08/2020  Procedure: RIGHT EXTRACORPOREAL SHOCK WAVE LITHOTRIPSY (ESWL);  Surgeon: Raynelle Bring, MD;  [facility];  Service: Urology;  Laterality: Right;  FOOT ARTHRODESIS Right 04/15/2021  Procedure: RIGHT SUBTALAR AND TALONAVICULAR FUSION;  Surgeon: Newt Minion, MD;  Location: [place];  Service: Orthopedics;  Laterality: Right;  HERNIA REPAIR    IR MECH REMOV OBSTRUC MAT ANY COLON TUBE W/FLUORO  08/27/2021  IR REPLC GASTRO/COLONIC TUBE PERCUT W/FLUORO  09/08/2021  RIGHT/LEFT HEART CATH AND CORONARY ANGIOGRAPHY N/A 03/11/2022  Procedure: RIGHT/LEFT HEART CATH AND CORONARY ANGIOGRAPHY;  Surgeon: Jettie Booze, MD;  Location: [place] [facility];  Service: Cardiovascular;  Laterality: N/A;  ROTATOR CUFF REPAIR    WHIPPLE PROCEDURE  07/13/2021 HPI: 77 year old male with history of nonobstructive CAD, mild to moderate aortic stenosis, moderate pulmonary regurgitation, pancreatic CA s/p whipple, diabetes, 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 On 12/28 (early morning), pt"Went into ventricular tachycardia versus polymorphic and received 1 minute of chest compressions.  He was pulseless.  He did not require defibrillation.  ROSC was achieved within 1 minute.  -Unclear etiology.  Lactic acid is normal.  Electrolytes are normal.  Kidney function is stable.  He has been admitted with stroke but is recovering.  Also complicated by influenza A.  He also has a new onset cardiomyopathy so clearly the combination of influenza and congestive heart failure could have resulted in ventricular arrhythmias"per cardiology note.  CXR indicated RLL PNA which could have been a result of aspiration.  BSE generated to assess swallow function 12/29.  Reconsulted 12/30,  Per MD note: "Bibasal infiltrate noted, query secondary to aspiration.  Repeat speech evaluation."  Subjective: alert and cooperative  Recommendations for follow up therapy are one component of a multi-disciplinary discharge planning process, led by the attending physician.  Recommendations may be updated  based on patient status, additional functional criteria and insurance authorization. Assessment / Plan / Recommendation   03/15/2022  12:00 PM Clinical Impressions Clinical Impression Pt presented with a mild oropharyngeal dysphagia marked by prolonged oral preparation of solids, mild deficit in base of tongue retraction, leading to mild vallecular residue, and reliable laryngeal vestibule closure with no aspiration. There was only one incident of high/transient penetration with thin liquids (PAS score of 2, considered WFL). Most notable was that when the pt consumed the 13 mm barium pill, it lodged for several minutes in his proximal esophagus (near C6-7), creating discomfort, wincing, and repeated effort to dislodge without success. Ultimately, sequential sips of thin water released the pill and it traveled through esophagus and LES without incident. It is likely that larger pieces of solid foods are causing similar issues. Recommend resuming a PO diet - start with dysphagia 2 (chopped) for now; thin liquids; crush meds or break into smaller pieces and give with puree.  SLP will follow for education/safety. Addendum: returned to room after study to discuss results and recs with Mr. Lindaman and his son, who was at the bedside.  MBS imaging was reviewed with son and we discussed recs to begin a chopped diet with thin liquids. Anticipate advancement to regular solids as mentation and strength improve. Pt may benefit from GI f/u to determine if there is stricture in proximal esophagus.   SLP Visit Diagnosis Dysphagia, oropharyngeal phase (R13.12)     03/15/2022  12:00 PM Treatment Recommendations Treatment Recommendations Therapy as outlined in treatment plan below     03/14/2022   9:20 AM Prognosis Prognosis for Safe Diet Advancement --   03/15/2022  12:00 PM Diet Recommendations SLP Diet Recommendations Dysphagia 2 (Fine chop) solids;Thin liquid Liquid Administration via Cup;Straw Medication Administration Crushed with  puree Compensations Slow rate;Small sips/bites     03/15/2022  12:00 PM Other Recommendations Recommended Consults Consider GI evaluation Oral Care Recommendations Oral care BID Follow Up Recommendations No SLP follow up   03/15/2022  12:00 PM Frequency and Duration  Speech Therapy Frequency (ACUTE ONLY) min 1 x/week Treatment Duration 1 week     03/15/2022  12:00 PM Oral Phase Oral Phase Impaired Oral - Puree Delayed oral transit;Reduced posterior propulsion Oral - Mech Soft Reduced posterior propulsion;Delayed oral transit    03/15/2022  12:00 PM Pharyngeal Phase Pharyngeal Phase Impaired Pharyngeal- Nectar Cup Reduced tongue base retraction;Pharyngeal residue - valleculae Pharyngeal- Puree Reduced tongue base retraction;Pharyngeal residue - valleculae Pharyngeal- Mechanical Soft Reduced tongue base retraction;Pharyngeal residue - valleculae    03/15/2022  12:00 PM Cervical Esophageal Phase  Cervical Esophageal Phase -- [name] 03/15/2022, 1:38 PM  Amanda L. Tivis Ringer, MA CCC/SLP Clinical Specialist - Acute Care SLP Acute Rehabilitation Services Office number [phone]                    CT Chest High Resolution  Result Date: 03/14/2022 CLINICAL DATA:  77 year old  male with history of abnormal chest x-ray. Evaluate for pneumonia. EXAM: CT CHEST WITHOUT CONTRAST TECHNIQUE: Multidetector CT imaging of the chest was performed following the standard protocol without intravenous contrast. High resolution imaging of the lungs, as well as inspiratory and expiratory imaging, was performed. RADIATION DOSE REDUCTION: This exam was performed according to the departmental dose-optimization program which includes automated exposure control, adjustment of the mA and/or kV according to patient size and/or use of iterative reconstruction technique. COMPARISON:  Chest CTA 12/04/2020. FINDINGS: Cardiovascular: Heart size is borderline enlarged. Small amount of pericardial fluid and/or thickening, unlikely to be of any  hemodynamic significance at this time. No pericardial calcification. There is aortic atherosclerosis, as well as atherosclerosis of the great vessels of the mediastinum and the coronary arteries, including calcified atherosclerotic plaque in the left main, left anterior descending, left circumflex and right coronary arteries. Calcifications of the aortic valve. Mediastinum/Nodes: Again noted is bulky mediastinal and bilateral hilar lymphadenopathy, with extensive nodal calcifications, similar to the prior study from 2022. Esophagus is unremarkable in appearance. No axillary lymphadenopathy. Lungs/Pleura: Patchy areas of peribronchovascular predominant ground-glass attenuation, septal thickening, peribronchovascular airspace consolidation and regional architectural distortion noted throughout the lungs bilaterally (right greater than left), most compatible with severe multilobar bilateral bronchopneumonia. This limits assessment for underlying interstitial lung disease on today's examination. Widespread subpleural nodularity noted throughout all aspects of the lungs bilaterally. Trace bilateral pleural effusions. Upper Abdomen: Coarse calcifications throughout the pancreas, indicative of chronic pancreatitis. Musculoskeletal: There are no aggressive appearing lytic or blastic lesions noted in the visualized portions of the skeleton. IMPRESSION: 1. Study is limited by what appears to be severe multilobar bilateral (right greater than left) bronchopneumonia. If there is persistent clinical concern for interstitial lung disease, repeat high-resolution chest CT should be considered in 3 months after resolution of the patient's acute illness. 2. Chronic imaging stigmata suggestive of sarcoidosis redemonstrated, as above. 3. Small bilateral pleural effusions lying dependently. 4. Small amount of pericardial fluid and/or thickening, unlikely of hemodynamic significance. No pericardial calcification. 5. Aortic  atherosclerosis, in addition to left main and three-vessel coronary artery disease. Assessment for potential risk factor modification, dietary therapy or pharmacologic therapy may be warranted, if clinically indicated. 6. There are calcifications of the aortic valve. Echocardiographic correlation for evaluation of potential valvular dysfunction may be warranted if clinically indicated. 7. Imaging findings compatible with severe chronic pancreatitis. Aortic Atherosclerosis (ICD10-I70.0). Electronically Signed   By: Vinnie Langton M.D.   On: 03/14/2022 08:53   MR CARDIAC VELOCITY FLOW MAP  Result Date: 03/13/2022 CLINICAL DATA:  Assess for cardiac amyloidosis. EXAM: CARDIAC MRI TECHNIQUE: The patient was scanned on a 1.5 Tesla GE magnet. A dedicated cardiac coil was used. Functional imaging was done using Fiesta sequences. 2,3, and 4 chamber views were done to assess for RWMA's. Modified Simpson's rule using a short axis stack was used to calculate an ejection fraction on a dedicated work Conservation officer, nature. The patient received 10 cc of Gadavist. After 10 minutes inversion recovery sequences were used to assess for infiltration and scar tissue. FINDINGS: Limited images of the lung fields showed extensive airspace disease right > left lung bases. Hilar lymphadenopathy. Large 9 x 2.6 cm soft tissue mass posterior to left atrium and compressing the atrium,?bulky mediastinal adenopathy. Trivial pericardial effusion. Mildly dilated left ventricle with normal wall thickness. Moderate global hypokinesis, EF 33%. Normal right ventricular size with mild hypokinesis, EF 46%. Normal right atrial size. The left atrium is compressed by posterior mass,  possible mediastinal adenopathy. Mild mitral regurgitation visually though regurgitant fraction calculated at 27% suggests moderate MR. Mild aortic insufficiency (regurgitant fraction 15%) with trileaflet aortic valve. No significant stenosis. On delayed  enhancement imaging, there was predominantly mid-wall late gadolinium enhancement (LGE) in the basal inferolateral wall and focally in the mid lateral wall there appeared to be a subendocardial component as well. Measurements: LVEDV 198 mL LVEDVi 121 mL/m2 LVSV 66 mL LVEF 33% RVEDV 150 mL RVEVDi 92 mL/m2 RVSV 69 mL RVEF 46% Aortic forward volume 48 mL Aortic regurgitant fraction 15% T1 1149, ECV 35% T2 49 basal inferolateral wall (normal). IMPRESSION: 1.  R>L base airspace disease as above. 2. Extensive hilar and mediastinal lymphadenopathy with compression of left atrium as noted above. 3. Mildly dilated LV with normal wall thickness. EF 33%, diffuse hypokinesis. 4.  Normal RV size with mild hypokinesis, EF 46%. 5.  Suspect mild aortic insuffiiciency and mitral regurgitation. 6. Predominantly mid-wall LGE with some subendocardial component in the basal inferolateral wall and focally in the mid lateral wall. Pattern is not clearly due to prior MI but cannot rule this out given moderate CAD on cath. Alternatively could be due to prior myocarditis. T2 not elevated in the basal inferolateral wall, so do not suspect active inflammation. 7. Findings not consistent with cardiac AL amyloidosis. No significant LVH, extracellular volume percentage only mildly elevated, and LGE is not characteristic. Dalton Mclean Electronically Signed   By: Loralie Champagne M.D.   On: 03/13/2022 11:57   MR CARDIAC VELOCITY FLOW MAP  Result Date: 03/13/2022 CLINICAL DATA:  Assess for cardiac amyloidosis. EXAM: CARDIAC MRI TECHNIQUE: The patient was scanned on a 1.5 Tesla GE magnet. A dedicated cardiac coil was used. Functional imaging was done using Fiesta sequences. 2,3, and 4 chamber views were done to assess for RWMA's. Modified Simpson's rule using a short axis stack was used to calculate an ejection fraction on a dedicated work Conservation officer, nature. The patient received 10 cc of Gadavist. After 10 minutes inversion recovery  sequences were used to assess for infiltration and scar tissue. FINDINGS: Limited images of the lung fields showed extensive airspace disease right > left lung bases. Hilar lymphadenopathy. Large 9 x 2.6 cm soft tissue mass posterior to left atrium and compressing the atrium,?bulky mediastinal adenopathy. Trivial pericardial effusion. Mildly dilated left ventricle with normal wall thickness. Moderate global hypokinesis, EF 33%. Normal right ventricular size with mild hypokinesis, EF 46%. Normal right atrial size. The left atrium is compressed by posterior mass, possible mediastinal adenopathy. Mild mitral regurgitation visually though regurgitant fraction calculated at 27% suggests moderate MR. Mild aortic insufficiency (regurgitant fraction 15%) with trileaflet aortic valve. No significant stenosis. On delayed enhancement imaging, there was predominantly mid-wall late gadolinium enhancement (LGE) in the basal inferolateral wall and focally in the mid lateral wall there appeared to be a subendocardial component as well. Measurements: LVEDV 198 mL LVEDVi 121 mL/m2 LVSV 66 mL LVEF 33% RVEDV 150 mL RVEVDi 92 mL/m2 RVSV 69 mL RVEF 46% Aortic forward volume 48 mL Aortic regurgitant fraction 15% T1 1149, ECV 35% T2 49 basal inferolateral wall (normal). IMPRESSION: 1.  R>L base airspace disease as above. 2. Extensive hilar and mediastinal lymphadenopathy with compression of left atrium as noted above. 3. Mildly dilated LV with normal wall thickness. EF 33%, diffuse hypokinesis. 4.  Normal RV size with mild hypokinesis, EF 46%. 5.  Suspect mild aortic insuffiiciency and mitral regurgitation. 6. Predominantly mid-wall LGE with some subendocardial component in the basal  inferolateral wall and focally in the mid lateral wall. Pattern is not clearly due to prior MI but cannot rule this out given moderate CAD on cath. Alternatively could be due to prior myocarditis. T2 not elevated in the basal inferolateral wall, so do not  suspect active inflammation. 7. Findings not consistent with cardiac AL amyloidosis. No significant LVH, extracellular volume percentage only mildly elevated, and LGE is not characteristic. Dalton Mclean Electronically Signed   By: Loralie Champagne M.D.   On: 03/13/2022 11:57   MR CARDIAC MORPHOLOGY W WO CONTRAST  Result Date: 03/13/2022 CLINICAL DATA:  Assess for cardiac amyloidosis. EXAM: CARDIAC MRI TECHNIQUE: The patient was scanned on a 1.5 Tesla GE magnet. A dedicated cardiac coil was used. Functional imaging was done using Fiesta sequences. 2,3, and 4 chamber views were done to assess for RWMA's. Modified Simpson's rule using a short axis stack was used to calculate an ejection fraction on a dedicated work Conservation officer, nature. The patient received 10 cc of Gadavist. After 10 minutes inversion recovery sequences were used to assess for infiltration and scar tissue. FINDINGS: Limited images of the lung fields showed extensive airspace disease right > left lung bases. Hilar lymphadenopathy. Large 9 x 2.6 cm soft tissue mass posterior to left atrium and compressing the atrium,?bulky mediastinal adenopathy. Trivial pericardial effusion. Mildly dilated left ventricle with normal wall thickness. Moderate global hypokinesis, EF 33%. Normal right ventricular size with mild hypokinesis, EF 46%. Normal right atrial size. The left atrium is compressed by posterior mass, possible mediastinal adenopathy. Mild mitral regurgitation visually though regurgitant fraction calculated at 27% suggests moderate MR. Mild aortic insufficiency (regurgitant fraction 15%) with trileaflet aortic valve. No significant stenosis. On delayed enhancement imaging, there was predominantly mid-wall late gadolinium enhancement (LGE) in the basal inferolateral wall and focally in the mid lateral wall there appeared to be a subendocardial component as well. Measurements: LVEDV 198 mL LVEDVi 121 mL/m2 LVSV 66 mL LVEF 33% RVEDV 150 mL  RVEVDi 92 mL/m2 RVSV 69 mL RVEF 46% Aortic forward volume 48 mL Aortic regurgitant fraction 15% T1 1149, ECV 35% T2 49 basal inferolateral wall (normal). IMPRESSION: 1.  R>L base airspace disease as above. 2. Extensive hilar and mediastinal lymphadenopathy with compression of left atrium as noted above. 3. Mildly dilated LV with normal wall thickness. EF 33%, diffuse hypokinesis. 4.  Normal RV size with mild hypokinesis, EF 46%. 5.  Suspect mild aortic insuffiiciency and mitral regurgitation. 6. Predominantly mid-wall LGE with some subendocardial component in the basal inferolateral wall and focally in the mid lateral wall. Pattern is not clearly due to prior MI but cannot rule this out given moderate CAD on cath. Alternatively could be due to prior myocarditis. T2 not elevated in the basal inferolateral wall, so do not suspect active inflammation. 7. Findings not consistent with cardiac AL amyloidosis. No significant LVH, extracellular volume percentage only mildly elevated, and LGE is not characteristic. Dalton Mclean Electronically Signed   By: Loralie Champagne M.D.   On: 03/13/2022 11:56   CARDIAC CATHETERIZATION  Result Date: 03/11/2022   Dist RCA lesion is 60-70% stenosed.   2nd Diag lesion is 70% stenosed.   Mid LAD lesion is 60% stenosed.   Ost LAD to Prox LAD lesion is 25% stenosed.   Dist LAD lesion is 70% stenosed.   LV end diastolic pressure is normal.   There is no aortic valve stenosis.   On 4L : Ao sat 93%, PA sat 45%, PA pressure 23/5, mean  PA pressure 14 mm Hg; mean PCWP 10/12, mean PCWP 9 mm Hg; CO 3.22 L/min; CI 1.87. Moderate focal disease.  Cardiomyopathy is out of proportion to degree of CAD.  LV dysfunction with significant decrease in cardiac output.  Discussed with the wife and son.    ECHOCARDIOGRAM LIMITED  Result Date: 03/11/2022    ECHOCARDIOGRAM LIMITED REPORT   Patient Name:   HENRIQUE PAREKH Date of Exam: 03/11/2022 Medical Rec #:  [id number]           Height:       65.0  in Accession #:    [id number]          Weight:       142.6 lb Date of Birth:  [birth year]            BSA:          1.713 m Patient Age:    37 years            BP:           111/63 mmHg Patient Gender: M                   HR:           67 bpm. Exam Location:  Inpatient Procedure: Limited Echo, Cardiac Doppler and Limited Color Doppler Indications:    Ventricular Tachycardia I47.2  History:        Patient has prior history of Echocardiogram examinations. CHF,                 Stroke, Aortic Valve Disease, Signs/Symptoms:Chest Pain; Risk                 Factors:Dyslipidemia, Former Smoker and Diabetes.  Sonographer:    Greer Pickerel Referring Phys: [id number] [place]  Sonographer Comments: Technically challenging study due to limited acoustic windows. Image acquisition challenging due to patient body habitus and Image acquisition challenging due to respiratory motion. IMPRESSIONS  1. Left ventricular ejection fraction, by estimation, is 30 to 35%. The left ventricle has moderately decreased function. The left ventricle demonstrates regional wall motion abnormalities (see scoring diagram/findings for description). Left ventricular  diastolic parameters are indeterminate.  2. Right ventricular systolic function is normal. The right ventricular size is normal.  3. A small pericardial effusion is present. The pericardial effusion is circumferential.  4. The mitral valve is normal in structure. Trivial mitral valve regurgitation. No evidence of mitral stenosis.  5. The aortic valve was not well visualized.  6. The inferior vena cava is normal in size with greater than 50% respiratory variability, suggesting right atrial pressure of 3 mmHg. FINDINGS  Left Ventricle: Left ventricular ejection fraction, by estimation, is 30 to 35%. The left ventricle has moderately decreased function. The left ventricle demonstrates regional wall motion abnormalities. Left ventricular diastolic parameters are indeterminate.  LV Wall  Scoring: The inferior septum and entire inferior wall are akinetic. The entire anterior wall, entire lateral wall, anterior septum, and apex are hypokinetic. Right Ventricle: The right ventricular size is normal. No increase in right ventricular wall thickness. Right ventricular systolic function is normal. Left Atrium: Left atrial size was not well visualized. Right Atrium: Right atrial size was not well visualized. Pericardium: A small pericardial effusion is present. The pericardial effusion is circumferential. Mitral Valve: The mitral valve is normal in structure. Trivial mitral valve regurgitation. No evidence of mitral valve stenosis. Tricuspid Valve: The tricuspid valve is normal in structure. Tricuspid valve regurgitation is  trivial. No evidence of tricuspid stenosis. Aortic Valve: The aortic valve was not well visualized. Pulmonic Valve: The pulmonic valve was not well visualized. Aorta: The aortic root was not well visualized. Venous: The inferior vena cava is normal in size with greater than 50% respiratory variability, suggesting right atrial pressure of 3 mmHg. Additional Comments: Spectral Doppler performed. Color Doppler performed.   LV Volumes (MOD) LV vol d, MOD A2C: 143.0 ml LV vol d, MOD A4C: 161.0 ml LV vol s, MOD A2C: 89.5 ml LV vol s, MOD A4C: 81.3 ml LV SV MOD A2C:     53.5 ml LV SV MOD A4C:     161.0 ml LV SV MOD BP:      66.0 ml MITRAL VALVE MV Area (PHT): 2.36 cm MV Decel Time: 322 msec MV E velocity: 53.10 cm/s MV A velocity: 67.30 cm/s MV E/A ratio:  0.79 Kardie Tobb DO Electronically signed by Berniece Salines DO Signature Date/Time: 03/11/2022/10:53:18 AM    Final    DG CHEST PORT 1 VIEW  Result Date: 03/11/2022 CLINICAL DATA:  77 year old male with history of hypoxia. EXAM: PORTABLE CHEST 1 VIEW COMPARISON:  Chest x-ray 03/07/2022. FINDINGS: Diffuse interstitial prominence and widespread peribronchial cuffing throughout the lungs bilaterally (right greater than left). Mass-like  opacity in the perihilar aspect of the right lung with surrounding ill-defined airspace consolidation. No pleural effusions. No pneumothorax. Heart size is normal. IMPRESSION: 1. The appearance the chest is concerning for bronchitis, likely with developing bronchopneumonia in the right middle and/or lower lobes. Mass-like opacity in the perihilar aspect of the right lung is presumably part of this airspace consolidation given the rapid development compared to the recent prior study. However, underlying neoplasm is difficult to exclude. Followup PA and lateral chest X-ray is recommended in 3-4 weeks following trial of antibiotic therapy to ensure resolution and exclude underlying malignancy. Electronically Signed   By: Vinnie Langton M.D.   On: 03/11/2022 07:09   DG CHEST PORT 1 VIEW  Result Date: 03/07/2022 CLINICAL DATA:  220523 Productive cough [id number] 10026 Shortness of breath 10026 EXAM: PORTABLE CHEST 1 VIEW COMPARISON:  September 03, 2021 FINDINGS: The cardiomediastinal silhouette is unchanged in contour. Small LEFT pleural effusion. Trace RIGHT pleural effusion. No pneumothorax. Increased diffuse interstitial prominence, peribronchial cuffing and perihilar vascular prominence. Mildly increased bibasilar reticular nodularity. IMPRESSION: Constellation of findings are favored to reflect pulmonary edema with small LEFT and trace RIGHT pleural effusions. Differential considerations include atypical pulmonary infection Electronically Signed   By: Valentino Saxon M.D.   On: 03/07/2022 10:43   ECHOCARDIOGRAM COMPLETE  Result Date: 03/06/2022    ECHOCARDIOGRAM REPORT   Patient Name:   William Barron Date of Exam: 03/06/2022 Medical Rec #:  [id number]           Height:       65.0 in Accession #:    [id number]          Weight:       140.0 lb Date of Birth:  17-Feb-1946            BSA:          1.700 m Patient Age:    10 years            BP:           129/73 mmHg Patient Gender: M                   HR:  78 bpm. Exam Location:  Inpatient Procedure: 2D Echo, Cardiac Doppler, Color Doppler and Strain Analysis Indications:    Stroke I63.9  History:        Patient has prior history of Echocardiogram examinations, most                 recent 12/01/2021. Stroke, Arrythmias:PVC,                 Signs/Symptoms:Dyspnea; Risk Factors:Hypertension, Dyslipidemia                 and Diabetes.  Sonographer:    Ronny Flurry Referring Phys: ERIC CHEN IMPRESSIONS  1. Left ventricular ejection fraction, by estimation, is 30 to 35%. Left ventricular ejection fraction by 3D volume is 37 %. The left ventricle has moderately decreased function. The left ventricle demonstrates global hypokinesis. The left ventricular internal cavity size was mildly dilated. There is mild left ventricular hypertrophy. Left ventricular diastolic parameters are consistent with Grade II diastolic dysfunction (pseudonormalization).  2. Right ventricular systolic function is normal. The right ventricular size is normal. Tricuspid regurgitation signal is inadequate for assessing PA pressure.  3. Left atrial size was mildly dilated.  4. A small pericardial effusion is present. The pericardial effusion is circumferential.  5. The mitral valve is grossly normal. Mild mitral valve regurgitation. No evidence of mitral stenosis.  6. The aortic valve is abnormal. There is moderate calcification of the aortic valve. Aortic valve regurgitation is mild. Aortic valve sclerosis/calcification is present. Mild aortic valve stenosis noted on prior echocardiogram. Reduced gradient on today's exam may be secondary to low flow state, with stroke volume index of 32 mL/m2.  7. The inferior vena cava is dilated in size with >50% respiratory variability, suggesting right atrial pressure of 8 mmHg. FINDINGS  Left Ventricle: Left ventricular ejection fraction, by estimation, is 30 to 35%. Left ventricular ejection fraction by 3D volume is 37 %. The left ventricle has moderately  decreased function. The left ventricle demonstrates global hypokinesis. Global longitudinal strain performed but not reported based on interpreter judgement due to suboptimal tracking. The left ventricular internal cavity size was mildly dilated. There is mild left ventricular hypertrophy. Left ventricular diastolic parameters are consistent with Grade II diastolic dysfunction (pseudonormalization). Right Ventricle: The right ventricular size is normal. No increase in right ventricular wall thickness. Right ventricular systolic function is normal. Tricuspid regurgitation signal is inadequate for assessing PA pressure. Left Atrium: Left atrial size was mildly dilated. Right Atrium: Right atrial size was normal in size. Pericardium: A small pericardial effusion is present. The pericardial effusion is circumferential. Mitral Valve: The mitral valve is grossly normal. Mild mitral valve regurgitation. No evidence of mitral valve stenosis. Tricuspid Valve: The tricuspid valve is normal in structure. Tricuspid valve regurgitation is trivial. No evidence of tricuspid stenosis. Aortic Valve: The aortic valve is abnormal. There is moderate calcification of the aortic valve. Aortic valve regurgitation is mild. Aortic regurgitation PHT measures 476 msec. Aortic valve sclerosis/calcification is present, without any evidence of aortic stenosis. Aortic valve mean gradient measures 8.5 mmHg. Aortic valve peak gradient measures 15.3 mmHg. Aortic valve area, by VTI measures 1.53 cm. Pulmonic Valve: The pulmonic valve was normal in structure. Pulmonic valve regurgitation is mild to moderate based on spectral Doppler profile. No evidence of pulmonic stenosis. Aorta: The aortic root is normal in size and structure. Venous: The inferior vena cava is dilated in size with greater than 50% respiratory variability, suggesting right atrial pressure of 8 mmHg. IAS/Shunts: No  atrial level shunt detected by color flow Doppler.  LEFT VENTRICLE  PLAX 2D LVIDd:         6.00 cm         Diastology LVIDs:         4.90 cm         LV e' medial:    3.53 cm/s LV PW:         1.20 cm         LV E/e' medial:  19.8 LV IVS:        0.90 cm         LV e' lateral:   4.35 cm/s LVOT diam:     2.20 cm         LV E/e' lateral: 16.1 LV SV:         55 LV SV Index:   32 LVOT Area:     3.80 cm        3D Volume EF                                LV 3D EF:    Left                                             ventricul                                             ar                                             ejection                                             fraction                                             by 3D                                             volume is                                             37 %.                                 3D Volume EF:                                3D EF:        37 %  LV EDV:       182 ml                                LV ESV:       115 ml                                LV SV:        67 ml RIGHT VENTRICLE             IVC RV S prime:     12.90 cm/s  IVC diam: 2.30 cm TAPSE (M-mode): 1.9 cm LEFT ATRIUM           Index        RIGHT ATRIUM           Index LA diam:      4.10 cm 2.41 cm/m   RA Area:     14.65 cm LA Vol (A2C): 44.2 ml 26.00 ml/m  RA Volume:   34.55 ml  20.32 ml/m LA Vol (A4C): 55.3 ml 32.53 ml/m  AORTIC VALVE AV Area (Vmax):    1.59 cm AV Area (Vmean):   1.49 cm AV Area (VTI):     1.53 cm AV Vmax:           195.50 cm/s AV Vmean:          133.000 cm/s AV VTI:            0.357 m AV Peak Grad:      15.3 mmHg AV Mean Grad:      8.5 mmHg LVOT Vmax:         81.90 cm/s LVOT Vmean:        52.100 cm/s LVOT VTI:          0.144 m LVOT/AV VTI ratio: 0.40 AI PHT:            476 msec  AORTA Ao Root diam: 3.20 cm Ao Asc diam:  3.40 cm MITRAL VALVE MV Area (PHT): 3.48 cm    SHUNTS MV Decel Time: 218 msec    Systemic VTI:  0.14 m MV E velocity: 70.00 cm/s  Systemic Diam: 2.20 cm MV A velocity: 70.80 cm/s  MV E/A ratio:  0.99 Cherlynn Kaiser MD Electronically signed by Cherlynn Kaiser MD Signature Date/Time: 03/06/2022/3:13:50 PM    Final    VAS [place] LOWER EXTREMITY VENOUS (DVT)  Result Date: 03/06/2022  Lower Venous DVT Study Patient Name:  FAWZI MELMAN  Date of Exam:   03/06/2022 Medical Rec #: [id number]            Accession #:    [id number] Date of Birth: 05/10/1945             Patient Gender: M Patient Age:   64 years Exam Location:  [facility] [place] Procedure:      VAS [place] LOWER EXTREMITY VENOUS (DVT) Referring Phys: Cornelius Moras XU --------------------------------------------------------------------------------  Indications: Stroke.  Comparison Study: Prior negative left LEV done 12/07/21 and 11/19/21 Performing Technologist: Sharion Dove RVS  Examination Guidelines: A complete evaluation includes B-mode imaging, spectral Doppler, color Doppler, and power Doppler as needed of all accessible portions of each vessel. Bilateral testing is considered an integral part of a complete examination. Limited examinations for reoccurring indications may be performed as noted. The reflux portion of the exam is performed with the patient in  reverse Trendelenburg.  +---------+---------------+---------+-----------+----------+--------------+ RIGHT    CompressibilityPhasicitySpontaneityPropertiesThrombus Aging +---------+---------------+---------+-----------+----------+--------------+ CFV      Full           Yes      Yes                                 +---------+---------------+---------+-----------+----------+--------------+ SFJ      Full                                                        +---------+---------------+---------+-----------+----------+--------------+ FV Prox  Full                                                        +---------+---------------+---------+-----------+----------+--------------+ FV Mid   Full                                                         +---------+---------------+---------+-----------+----------+--------------+ FV DistalFull                                                        +---------+---------------+---------+-----------+----------+--------------+ PFV      Full                                                        +---------+---------------+---------+-----------+----------+--------------+ POP      Full           Yes      Yes                                 +---------+---------------+---------+-----------+----------+--------------+ PTV      Full                                                        +---------+---------------+---------+-----------+----------+--------------+ PERO     Full                                                        +---------+---------------+---------+-----------+----------+--------------+   +---------+---------------+---------+-----------+----------+--------------+ LEFT     CompressibilityPhasicitySpontaneityPropertiesThrombus Aging +---------+---------------+---------+-----------+----------+--------------+ CFV      Full           Yes      Yes                                 +---------+---------------+---------+-----------+----------+--------------+  SFJ      Full                                                        +---------+---------------+---------+-----------+----------+--------------+ FV Prox  Full                                                        +---------+---------------+---------+-----------+----------+--------------+ FV Mid   Full                                                        +---------+---------------+---------+-----------+----------+--------------+ FV DistalFull                                                        +---------+---------------+---------+-----------+----------+--------------+ PFV      Full                                                         +---------+---------------+---------+-----------+----------+--------------+ POP      Full           Yes      Yes                                 +---------+---------------+---------+-----------+----------+--------------+ PTV      Full                                                        +---------+---------------+---------+-----------+----------+--------------+ PERO     Full                                                        +---------+---------------+---------+-----------+----------+--------------+     Summary: BILATERAL: - No evidence of deep vein thrombosis seen in the lower extremities, bilaterally. -No evidence of popliteal cyst, bilaterally.   *See table(s) above for measurements and observations. Electronically signed by Deitra Mayo MD on 03/06/2022 at 2:05:58 PM.    Final    MR BRAIN WO CONTRAST  Result Date: 03/06/2022 CLINICAL DATA:  Initial evaluation for neuro deficit, stroke suspected. EXAM: MRI HEAD WITHOUT CONTRAST TECHNIQUE: Multiplanar, multiecho pulse sequences of the brain and surrounding structures were obtained without intravenous contrast. COMPARISON:  Prior CT from earlier the same day. FINDINGS: Brain: Cerebral volume within normal limits. Patchy T2/FLAIR hyperintensity involving the  periventricular deep white matter both cerebral hemispheres, most consistent with chronic small vessel ischemic disease, moderate in nature. Confluent restricted diffusion involving the right occipital lobe, consistent with an acute to early subacute right PCA territory infarct. Associated mild petechial blood products without frank hemorrhagic transformation. No significant regional mass effect. No other evidence for acute or subacute ischemia. Gray-white matter differentiation otherwise maintained. No other acute or chronic intracranial blood products. No mass lesion, midline shift or mass effect. No hydrocephalus or extra-axial fluid collection. Pituitary gland and  suprasellar region within normal limits. Vascular: Major intracranial vascular flow voids are maintained. Skull and upper cervical spine: Craniocervical junction normal. Bone marrow signal intensity within normal limits. No scalp soft tissue abnormality. Sinuses/Orbits: Globes and orbital soft tissues demonstrate no acute finding. Paranasal sinuses are largely clear. No significant mastoid effusion. Other: 1.8 cm T2 hypointense lesion noted within the posterior aspect of the right parotid gland, indeterminate. IMPRESSION: 1. Acute to early subacute right PCA territory infarct. Associated mild petechial blood products without frank hemorrhagic transformation or significant regional mass effect. 2. Underlying moderate chronic microvascular ischemic disease. 3. 1.8 cm T2 hypointense lesion within the posterior aspect of the right parotid gland, indeterminate. Nonemergent outpatient ENT referral for further workup and evaluation suggested. Electronically Signed   By: Jeannine Boga M.D.   On: 03/06/2022 00:23   CT ANGIO HEAD NECK W WO CM  Result Date: 03/05/2022 CLINICAL DATA:  [place] EXAM: CT ANGIOGRAPHY HEAD AND NECK TECHNIQUE: Multidetector CT imaging of the head and neck was performed using the standard protocol during bolus administration of intravenous contrast. Multiplanar CT image reconstructions and MIPs were obtained to evaluate the vascular anatomy. Carotid stenosis measurements (when applicable) are obtained utilizing NASCET criteria, using the distal internal carotid diameter as the denominator. RADIATION DOSE REDUCTION: This exam was performed according to the departmental dose-optimization program which includes automated exposure control, adjustment of the mA and/or kV according to patient size and/or use of iterative reconstruction technique. CONTRAST:  52m OMNIPAQUE IOHEXOL 350 MG/ML SOLN COMPARISON:  CT head 12/07/2021. FINDINGS: CT HEAD FINDINGS Brain: Acute or early subacute infarct in  the right occipital lobe (PCA territory). Mild sulcal effacement is region without significant mass effect. No midline shift. No evidence of acute hemorrhage, mass lesion, or hydrocephalus. Vascular: See below. Skull: No acute fracture. Sinuses/Orbits: Clear sinuses.  No acute orbital findings. Other: No mastoid effusions. Review of the MIP images confirms the above findings CTA NECK FINDINGS Aortic arch: Great vessel origins are patent. Right carotid system: Patent. Calcific atherosclerosis at the carotid bifurcation without significant stenosis. Left carotid system: Patent.  No significant stenosis. Vertebral arteries: Left dominant. Both vertebral arteries are patent without significant stenosis. Skeleton: Multilevel degenerative change. Other neck: Approximately 1.6 cm partially calcified lesion in the posterior aspect of the right parotid gland with multiple surrounding smaller lesions. Upper chest: Limited motion limited assessment without consolidation. Review of the MIP images confirms the above findings CTA HEAD FINDINGS Anterior circulation: Bilateral intracranial ICAs, MCAs, and ACAs are patent without proximal high-grade stenosis. Posterior circulation: Bilateral intradural vertebral arteries, and basilar artery are patent without significant stenosis. Proximally, the post rib arteries are patent. Suspected high-grade stenosis versus occlusion of the distal (mid to distal P3) right PCA (for example see series 15, image 22). Venous sinuses: As permitted by contrast timing, patent. Review of the MIP images confirms the above findings IMPRESSION: 1. Acute or early subacute infarct in the right occipital lobe (PCA territory). 2. Suspected high-grade stenosis  versus occlusion of the distal (mid to distal P3) right PCA (for example see series 15, image 22), in the region of infarct described above. 3. Approximately 1.6 cm partially calcified lesion in the posterior aspect of the right parotid gland with  multiple surrounding smaller lesions. These could potentially represent primary parotid neoplasms (either benign or malignant) or enlarged lymph nodes. Recommend outpatient ENT follow-up for management. Findings discussed with provider [place] via telephone at 7:15 p.m. Electronically Signed   By: Margaretha Sheffield M.D.   On: 03/05/2022 19:16    Microbiology: Results for orders placed or performed during the hospital encounter of 03/05/22  Resp panel by RT-PCR (RSV, Flu A&B, Covid) Anterior Nasal Swab     Status: Abnormal   Collection Time: 03/07/22  4:09 PM   Specimen: Anterior Nasal Swab  Result Value Ref Range Status   SARS Coronavirus 2 by RT PCR NEGATIVE NEGATIVE Final    Comment: (NOTE) SARS-CoV-2 target nucleic acids are NOT DETECTED.  The SARS-CoV-2 RNA is generally detectable in upper respiratory specimens during the acute phase of infection. The lowest concentration of SARS-CoV-2 viral copies this assay can detect is 138 copies/mL. A negative result does not preclude SARS-Cov-2 infection and should not be used as the sole basis for treatment or other patient management decisions. A negative result may occur with  improper specimen collection/handling, submission of specimen other than nasopharyngeal swab, presence of viral mutation(s) within the areas targeted by this assay, and inadequate number of viral copies(<138 copies/mL). A negative result must be combined with clinical observations, patient history, and epidemiological information. The expected result is Negative.  Fact Sheet for Patients:  EntrepreneurPulse.com.au  Fact Sheet for Healthcare Providers:  IncredibleEmployment.be  This test is no t yet approved or cleared by the [place] FDA and  has been authorized for detection and/or diagnosis of SARS-CoV-2 by FDA under an Emergency Use Authorization (EUA). This EUA will remain  in effect (meaning this test can be used) for  the duration of the COVID-19 declaration under Section 564(b)(1) of the Act, 21 U.S.C.section 360bbb-3(b)(1), unless the authorization is terminated  or revoked sooner.       Influenza A by PCR POSITIVE (A) NEGATIVE Final   Influenza B by PCR NEGATIVE NEGATIVE Final    Comment: (NOTE) The Xpert Xpress SARS-CoV-2/FLU/RSV plus assay is intended as an aid in the diagnosis of influenza from Nasopharyngeal swab specimens and should not be used as a sole basis for treatment. Nasal washings and aspirates are unacceptable for Xpert Xpress SARS-CoV-2/FLU/RSV testing.  Fact Sheet for Patients: EntrepreneurPulse.com.au  Fact Sheet for Healthcare Providers: IncredibleEmployment.be  This test is not yet approved or cleared by the [place] FDA and has been authorized for detection and/or diagnosis of SARS-CoV-2 by FDA under an Emergency Use Authorization (EUA). This EUA will remain in effect (meaning this test can be used) for the duration of the COVID-19 declaration under Section 564(b)(1) of the Act, 21 U.S.C. section 360bbb-3(b)(1), unless the authorization is terminated or revoked.     Resp Syncytial Virus by PCR NEGATIVE NEGATIVE Final    Comment: (NOTE) Fact Sheet for Patients: EntrepreneurPulse.com.au  Fact Sheet for Healthcare Providers: IncredibleEmployment.be  This test is not yet approved or cleared by the [place] FDA and has been authorized for detection and/or diagnosis of SARS-CoV-2 by FDA under an Emergency Use Authorization (EUA). This EUA will remain in effect (meaning this test can be used) for the duration of the COVID-19 declaration under Section 564(b)(1)  of the Act, 21 U.S.C. section 360bbb-3(b)(1), unless the authorization is terminated or revoked.  Performed at [facility], [place] [address], [place], [place] [address]   Respiratory (~20 pathogens) panel by PCR     Status:  Abnormal   Collection Time: 03/07/22  4:09 PM   Specimen: Nasopharyngeal Swab; Respiratory  Result Value Ref Range Status   Adenovirus NOT DETECTED NOT DETECTED Final   Coronavirus 229E NOT DETECTED NOT DETECTED Final    Comment: (NOTE) The Coronavirus on the Respiratory Panel, DOES NOT test for the novel  Coronavirus (2019 nCoV)    Coronavirus HKU1 NOT DETECTED NOT DETECTED Final   Coronavirus NL63 NOT DETECTED NOT DETECTED Final   Coronavirus OC43 NOT DETECTED NOT DETECTED Final   Metapneumovirus NOT DETECTED NOT DETECTED Final   Rhinovirus / Enterovirus NOT DETECTED NOT DETECTED Final   Influenza A H1 2009 DETECTED (A) NOT DETECTED Final   Influenza B NOT DETECTED NOT DETECTED Final   Parainfluenza Virus 1 NOT DETECTED NOT DETECTED Final   Parainfluenza Virus 2 NOT DETECTED NOT DETECTED Final   Parainfluenza Virus 3 NOT DETECTED NOT DETECTED Final   Parainfluenza Virus 4 NOT DETECTED NOT DETECTED Final   Respiratory Syncytial Virus NOT DETECTED NOT DETECTED Final   Bordetella pertussis NOT DETECTED NOT DETECTED Final   Bordetella Parapertussis NOT DETECTED NOT DETECTED Final   Chlamydophila pneumoniae NOT DETECTED NOT DETECTED Final   Mycoplasma pneumoniae NOT DETECTED NOT DETECTED Final    Comment: Performed at [facility], [place] [address], [place], [place] [address]  Culture, blood (Routine X 2) w Reflex to ID Panel     Status: None   Collection Time: 03/13/22 12:57 PM   Specimen: BLOOD LEFT ARM  Result Value Ref Range Status   Specimen Description BLOOD LEFT ARM  Final   Special Requests   Final    BOTTLES DRAWN AEROBIC AND ANAEROBIC Blood Culture results may not be optimal due to an inadequate volume of blood received in culture bottles   Culture   Final    NO GROWTH 5 DAYS Performed at [facility], [place] [address] [place], [place] [address]    Report Status 03/18/2022 FINAL  Final  Culture, blood (Routine X 2) w Reflex to ID Panel     Status: None    Collection Time: 03/13/22 12:57 PM   Specimen: BLOOD LEFT ARM  Result Value Ref Range Status   Specimen Description BLOOD LEFT ARM  Final   Special Requests   Final    BOTTLES DRAWN AEROBIC AND ANAEROBIC Blood Culture results may not be optimal due to an inadequate volume of blood received in culture bottles   Culture   Final    NO GROWTH 5 DAYS Performed at [facility], [place] [address], [place], [place] [address]    Report Status 03/18/2022 FINAL  Final  MRSA Next Gen by PCR, Nasal     Status: Abnormal   Collection Time: 03/13/22  2:34 PM   Specimen: Nasal Mucosa; Nasal Swab  Result Value Ref Range Status   MRSA by PCR Next Gen DETECTED (A) NOT DETECTED Final    Comment: RESULT CALLED TO, READ BACK BY AND VERIFIED WITH:  Langston Masker, RN 03/13/22 1724 A. LAFRANCE (NOTE) The GeneXpert MRSA Assay (FDA approved for NASAL specimens only), is one component of a comprehensive MRSA colonization surveillance program. It is not intended to diagnose MRSA infection nor to guide or monitor treatment  for MRSA infections. Test performance is not FDA approved in patients less than 40 years old. Performed at [facility], [place] [address], [place], [place] [address]     Labs: CBC: Recent Labs  Lab 03/16/22 0130 03/17/22 0059 03/18/22 0100 03/22/22 1019  WBC 5.1 5.4 5.8 8.3  HGB 9.9* 9.8* 9.9* 10.1*  HCT 33.7* 31.8* 31.7* 33.0*  MCV 78.6* 77.6* 77.1* 77.5*  PLT 409* 431* 461* 272*   Basic Metabolic Panel: Recent Labs  Lab 03/16/22 0130 03/17/22 0059 03/18/22 0100 03/19/22 0208 03/20/22 0125 03/22/22 1019  NA 143 141 138 139  --  139  K 4.1 3.9 4.1 4.1  --  4.7  CL 112* 111 112* 111  --  108  CO2 19* 20* 18* 20*  --  24  GLUCOSE 115* 193* 173* 162*  --  217*  BUN 32* 29* 23 22  --  33*  CREATININE 1.36* 1.53* 1.24 1.22  --  1.36*  CALCIUM 8.1* 8.0* 7.9* 8.2*  --  8.6*  MG  --   --  2.1 1.9 2.2  --   PHOS  --   --  2.4* 2.9 3.2  --    CBG: Recent Labs  Lab  03/21/22 0826 03/21/22 1248 03/21/22 1703 03/21/22 1958 03/22/22 0614  GLUCAP 226* 227* 128* 141* 286*    Discharge time spent: 35 minutes.  Signed: Cordelia Poche, MD [facility] 03/22/2022

## 2022-03-22 NOTE — H&P (Signed)
Physical Medicine and Rehabilitation Admission H&P    CC: Functional deficits secondary to right PCA territory stroke  HPI: William Barron is a 77 year old male who presented to [facility] emergency department on 03/05/2022 stating he awoke that morning with headache and vision disturbances.  On exam, the patient had a cranial nerve VII palsy and dilated pupil on the right and possible left-sided visual field deficit.  CTA of the head and neck performed.  Discussed with Dr. Leonel Ramsay and CT scan of the head in the emergency department demonstrated an acute/subacute right occipital stroke.  Out of window for tenecteplase.  Concern for cardioembolic source.    He was admitted to the hospitalist service.  He reports chronic right lower extremity weakness and foot drop.  Echocardiogram revealed EF of 30 to 35%.  This was significantly reduced as compared to previous echocardiogram in September 2023 with an EF of approximately 55 to 60%.  Cardiology was consulted on 12/24.  No acute ischemic changes on EKG.  Carvedilol 3.125 mg started twice daily.  Other goal-directed medical therapy adjusted over the next several days.  He was started on losartan 25 mg daily on 12/25.  He was started on aspirin 325 mg daily along with Plavix 75 mg for 3 months with recommendations to continue aspirin alone or after.  Symptoms improved although he still complained of generalized weakness but no focal neurologic deficits. Lower extremity venous Doppler studies were negative for DVT.   On 12/26 he reported a cough and was diagnosed with influenza A.  Treated with Tamiflu.  Failed swallow eval and core track was placed 1/3.  Began tube feeds with protein supplements and continued on Creon for pancreatic insufficiency.  Started on Farxiga 10 mg daily by heart failure team.  Did not require diuretics.  Underwent CT scanning of the chest which revealed bilateral bronchopneumonia. CODE BLUE was called  after the patient was found unresponsive and pulseless on around 0445 hrs. on 12/28.  ACLS protocol was underway with ROSC in approximately 2 minutes.  Monitor with ventricular tachycardia.  Placed on amiodarone drip and moved to the ICU with CCM consultation.  Underwent right and left heart cath.  Mild to moderate scattered atherosclerosis.  The left ventricular systolic function is normal.  No hemodynamically significant lesions.  Continue aggressive medical therapy. Started on spirinolactone.  Cardiac MRI performed and consistent with myocarditis and no amyloid involvement.  Electrophysiology consultation obtained.  Noted to have oral thrush on 12/30 and started on nystatin.  Entresto started  1/04 after improvement in serum creatinine. Losartan discontinued.  Barium swallow completed which showed concern for possible proximal esophageal stricture.  Continue dysphagia 2 diet, thin liquids, medications crushed pured.  Palliative care consultation obtained on 1/1.  Palliative care nurse practitioner, [name], discussed and reviewed CODE STATUS on 1/3.  "In the event of cardiac arrest (no pulse), family agrees to DNR status with no CPR, defibrillation, ACLS medications or intubation."  The patient requires inpatient physical medicine and rehabilitation evaluations and treatment secondary to dysfunction due to right PCA territory stroke, VT arrest, acute heart failure, debility and failure to thrive.   Medical history is significant for intraductal papillary mucinous neoplasms of the pancreas status post Whipple procedure 2023 with pancreatic insufficiency; multiple myeloma with AL amyloidosis; bilateral lower extremity chronic sensorimotor axonal polyneuropathy status post NCV and EMG per Dr. Amalia Hailey; status post double arthrodesis right foot February 2023 by Dr. Sharol Given; possible iatrogenic dropfoot of the right lower  extremity; diabetes mellitus>> recently prescribed Freestyle Libre; overactive bladder;  nephrolithiasis; prostate cancer status post radiation 2022 and follows with Dr. Jeffie Pollock; low testosterone.   Review of Systems  Constitutional:  Positive for weight loss.  HENT:         Dry mouth  Eyes:  Positive for double vision.  Respiratory:  Positive for cough.   Cardiovascular:  Negative for chest pain.  Gastrointestinal:  Negative for nausea and vomiting.  Genitourinary:  Negative for dysuria.  Musculoskeletal:  Positive for falls and myalgias.  Skin:  Negative for itching.  Neurological:  Positive for tremors, focal weakness and weakness. Negative for headaches.  Psychiatric/Behavioral:  Negative for depression.    Past Medical History:  Diagnosis Date   Amyloidosis ([place])    Diabetes mellitus without complication ([place])    Feeding tube dysfunction, initial encounter 08/12/2021   GERD (gastroesophageal reflux disease)    Hearing loss    Has hearing aids   History of kidney stones    Hyperlipidemia    Jejunostomy tube present ([place])    Multiple myeloma ([facility])    and amylodosis    Nephrolithiasis    Occasional tremors    Pancreatic insufficiency    Pneumonia    Prostate cancer ([place])    Prostatitis    acute and chronic   Skin cancer    Past Surgical History:  Procedure Laterality Date   APPENDECTOMY     CARDIAC CATHETERIZATION N/A 06/13/2015   Procedure: Left Heart Cath and Coronary Angiography;  Surgeon: Jettie Booze, MD;  Location: [place] [facility];  Service: Cardiovascular;  Laterality: N/A;   COLONOSCOPY  04/07/2000   ELBOW SURGERY  03/15/2001   right   ESOPHAGOGASTRODUODENOSCOPY (EGD) WITH PROPOFOL N/A 09/07/2021   Procedure: ESOPHAGOGASTRODUODENOSCOPY (EGD) WITH PROPOFOL;  Surgeon: Irene Shipper, MD;  Location: [place];  Service: Gastroenterology;  Laterality: N/A;   EXTRACORPOREAL SHOCK WAVE LITHOTRIPSY Left 07/17/2018   Procedure: EXTRACORPOREAL SHOCK WAVE LITHOTRIPSY (ESWL);  Surgeon: [name], MD;  Location: [facility];  Service: Urology;   Laterality: Left;   EXTRACORPOREAL SHOCK WAVE LITHOTRIPSY Right 12/08/2020   Procedure: RIGHT EXTRACORPOREAL SHOCK WAVE LITHOTRIPSY (ESWL);  Surgeon: Raynelle Bring, MD;  [facility];  Service: Urology;  Laterality: Right;   FOOT ARTHRODESIS Right 04/15/2021   Procedure: RIGHT SUBTALAR AND TALONAVICULAR FUSION;  Surgeon: Newt Minion, MD;  Location: [place];  Service: Orthopedics;  Laterality: Right;   HERNIA REPAIR     IR MECH REMOV OBSTRUC MAT ANY COLON TUBE W/FLUORO  08/27/2021   IR REPLC GASTRO/COLONIC TUBE PERCUT W/FLUORO  09/08/2021   RIGHT/LEFT HEART CATH AND CORONARY ANGIOGRAPHY N/A 03/11/2022   Procedure: RIGHT/LEFT HEART CATH AND CORONARY ANGIOGRAPHY;  Surgeon: Jettie Booze, MD;  Location: [place] [facility];  Service: Cardiovascular;  Laterality: N/A;   ROTATOR CUFF REPAIR     WHIPPLE PROCEDURE  07/13/2021   Family History  Problem Relation Age of Onset   Pancreatic cancer Father    Prostate cancer Father    Parkinsonism Mother    CAD Brother 31   Cancer Paternal Grandmother    Breast cancer Neg Hx    Colon cancer Neg Hx    Social History:  reports that he quit smoking about 45 years ago. His smoking use included cigarettes. He has a 60.00 pack-year smoking history. He has never used smokeless tobacco. He reports current alcohol use of about 3.0 standard drinks of alcohol per week. He reports that he does  not use drugs. Allergies: No Known Allergies Medications Prior to Admission  Medication Sig Dispense Refill   Cyanocobalamin (B-12 PO) Take 1 tablet by mouth in the morning.     insulin aspart (NOVOLOG FLEXPEN) 100 UNIT/ML FlexPen Inject 8 Units into the skin 3 (three) times daily before meals.     lipase/protease/amylase (CREON) 36000 UNITS CPEP capsule Take 3 capsules (108,000 Units total) by mouth 3 (three) times daily with meals. May also take 1 capsule (36,000 Units total) as needed (with snacks). (Patient taking differently: Take 2-3  capsules (72000-108000 units) by mouth 3 (three) times daily with meals. May also take 1 capsule (36,000 units) as needed (with snacks).) 330 capsule 11   pantoprazole sodium (PROTONIX) 40 mg Take 40 mg by mouth daily. (Patient taking differently: Take 40 mg by mouth in the morning.) 30 packet    Testosterone 20.25 MG/ACT (1.62%) GEL Apply 4 Pump topically at bedtime.     Vibegron (GEMTESA) 75 MG TABS Take 75 mg by mouth in the morning.     BD PEN NEEDLE NANO U/F 32G X 4 MM MISC USE AS DIRECTED WITH INSULIN PEN THREE TIMES DAILY 200 each 12   Continuous Blood Gluc Receiver (FREESTYLE LIBRE 2 READER) DEVI Use as instructed to check blood sugars 1 each 0   Continuous Blood Gluc Sensor (FREESTYLE LIBRE 3 SENSOR) MISC Place 1 sensor on the skin every 14 days. Use to check glucose continuously 6 each 3   insulin degludec (TRESIBA FLEXTOUCH) 100 UNIT/ML FlexTouch Pen Inject 15 Units into the skin daily. (Patient taking differently: Inject 6 Units into the skin in the morning.) 15 mL 1      Home: Home Living Family/patient expects to be discharged to:: Unsure Living Arrangements: Spouse/significant other Available Help at Discharge: Family, Available 24 hours/day Type of Home: House Home Access: Stairs to enter [employer] of Steps: 4 Entrance Stairs-Rails: Right Home Layout: Two level, 1/2 bath on main level Alternate Level Stairs-Number of Steps: has chair lift Alternate Level Stairs-Rails: Left Bathroom Shower/Tub: Multimedia programmer: Standard Bathroom Accessibility: Yes Home Equipment: Shower seat, Conservation officer, nature (2 wheels), Rollator (4 wheels), BSC/3in1, Wheelchair - manual Additional Comments: Using RW to ambulate at home. Wife cooks meals.  Lives With: Spouse   Functional History: Prior Function Prior Level of Function : Needs assist, History of Falls (last six months) Mobility Comments: Ambulating with RW at home, one fall 3 mo ago. ADLs Comments: Wife  assists with cooking/cleaning. Pt able to bath dress ind.  Functional Status:  Mobility: Bed Mobility Overal bed mobility: Needs Assistance Bed Mobility: Supine to Sit Supine to sit: Min guard, HOB elevated Sit to supine: Min guard, HOB elevated General bed mobility comments: Min guard for safety. use of rails and increased time. Transfers Overall transfer level: Needs assistance Equipment used: Rolling walker (2 wheels) Transfers: Sit to/from Stand Sit to Stand: Mod assist, +2 safety/equipment Bed to/from chair/wheelchair/BSC transfer type:: Step pivot Step pivot transfers: Max assist (Assistance for RW management, cuing for sequencing & to reach back for recliner prior to sitting.) General transfer comment: Mod A to power to standing with cues for hand placement/technique. Stood from [employer], from chair x2, flexed posture at hips/knees and tremors present throughout. Ambulation/Gait Ambulation/Gait assistance: Mod assist Gait Distance (Feet): 8 Feet (+8' + 10') Assistive device: Rolling walker (2 wheels) Gait Pattern/deviations: Narrow base of support, Decreased stride length, Trunk flexed, Knee flexed in stance - right, Knee flexed in stance - left, Shuffle,  Step-to pattern, Decreased step length - left General Gait Details: Slow, unsteady gait with decreased step length LLE, flexed posture throughout and assist needed for forward momentum and RW management. 2 seated rest breaks. 3/4 DOE. Sp02 dropped to 86% on last bout on 3L/min 02 [place]. Cues for pursed lip breathing. Gait velocity: decreased Gait velocity interpretation: <1.31 ft/sec, indicative of household ambulator Pre-gait activities: Pt initially is very wobbly but is able to get balance with physical assistance.    ADL: ADL Overall ADL's : Needs assistance/impaired Eating/Feeding: Minimal assistance, Sitting Eating/Feeding Details (indicate cue type and reason): requires min assist to use fork with R hand, limited by  tremors Grooming: Sitting, Supervision/safety Upper Body Bathing: Supervision/ safety, Sitting Lower Body Bathing: Minimal assistance, Sit to/from stand Upper Body Dressing : Supervision/safety, Sitting Lower Body Dressing: Minimal assistance, Sitting/lateral leans, Sit to/from stand Lower Body Dressing Details (indicate cue type and reason): able to manage socks, min assist in standing and relies on BUE support Toilet Transfer: Moderate assistance, +2 for physical assistance, +2 for safety/equipment, Ambulation, BSC/3in1, Rolling walker (2 wheels) Toilet Transfer Details (indicate cue type and reason): simulated Functional mobility during ADLs: Minimal assistance, Rolling walker (2 wheels), Cueing for safety General ADL Comments: sit<>stand 3x with mod A +2. tolerated 65f ambulation with RW 3x. limited by activity tolerance and weakness  Cognition: Cognition Overall Cognitive Status: Impaired/Different from baseline Arousal/Alertness: Awake/alert Orientation Level: Oriented X4 Year: 2023 Month: December Day of Week: Correct Attention: Sustained Sustained Attention: Appears intact Memory: Impaired Memory Impairment: Retrieval deficit Awareness: Appears intact Problem Solving: Appears intact (stated he "knows he needs walker for further distances") Safety/Judgment: Other (comment) (appears grossly WFL) Cognition Arousal/Alertness: Awake/alert Behavior During Therapy: WFL for tasks assessed/performed Overall Cognitive Status: Impaired/Different from baseline Area of Impairment: Attention, Memory, Following commands, Safety/judgement, Awareness, Problem solving Current Attention Level: Selective Memory: Decreased recall of precautions, Decreased short-term memory Following Commands: Follows one step commands consistently, Follows one step commands with increased time, Follows multi-step commands inconsistently Safety/Judgement: Decreased awareness of safety, Decreased awareness of  deficits Awareness: Emergent Problem Solving: Slow processing, Requires verbal cues, Difficulty sequencing, Decreased initiation General Comments: Less verbal today, concentrating on breathing.  Physical Exam: Blood pressure (!) 95/51, pulse 76, temperature 97.7 F (36.5 C), resp. rate 20, height '5\' 5"'$  (1.651 m), weight 55.7 kg, SpO2 99 %. Physical Exam Constitutional:      General: He is not in acute distress.    Appearance: He is ill-appearing.  HENT:     Head:     Comments: NGT in place    Right Ear: External ear normal.     Left Ear: External ear normal.     Nose: Nose normal.     Mouth/Throat:     Comments: Mouth and oral cavity with residual barium, thrush Eyes:     General: No scleral icterus.    Conjunctiva/sclera: Conjunctivae normal.  Cardiovascular:     Rate and Rhythm: Normal rate and regular rhythm.     Heart sounds: No murmur heard.    No gallop.  Pulmonary:     Effort: Pulmonary effort is normal. No respiratory distress.     Breath sounds: Normal breath sounds.  Abdominal:     General: Bowel sounds are normal. There is no distension.     Palpations: Abdomen is soft.     Tenderness: There is no abdominal tenderness.  Musculoskeletal:     Cervical back: Normal range of motion.  Skin:    General: Skin is  warm and dry.     Findings: Bruising present.  Neurological:     Comments: Pt is alert. Speech dysarthric. Right CN VI palsy with diplopia with rightward gaze. Seems to attend to all visual fields. Fair insight and awareness.  Moves all 4's, RUE 4+/5. LUE 4/5. RLE 4/5 proximally but with ongoing, chronic foot drop. LLE 4/5. Sensory: sl decrease in LT in both feet. DTR's 1+. Mild intentional tremor in UE's but inconsistent.  Psychiatric:     Comments: Generally pleasant and appropriate.     Results for orders placed or performed during the hospital encounter of 03/05/22 (from the past 48 hour(s))  Glucose, capillary     Status: Abnormal   Collection Time:  03/20/22 12:17 PM  Result Value Ref Range   Glucose-Capillary 271 (H) 70 - 99 mg/dL    Comment: Glucose reference range applies only to samples taken after fasting for at least 8 hours.   Comment 1 Notify RN    Comment 2 Document in Chart   Glucose, capillary     Status: Abnormal   Collection Time: 03/20/22  3:54 PM  Result Value Ref Range   Glucose-Capillary 205 (H) 70 - 99 mg/dL    Comment: Glucose reference range applies only to samples taken after fasting for at least 8 hours.   Comment 1 Notify RN    Comment 2 Document in Chart   Glucose, capillary     Status: Abnormal   Collection Time: 03/20/22  8:23 PM  Result Value Ref Range   Glucose-Capillary 179 (H) 70 - 99 mg/dL    Comment: Glucose reference range applies only to samples taken after fasting for at least 8 hours.  Glucose, capillary     Status: Abnormal   Collection Time: 03/20/22 11:37 PM  Result Value Ref Range   Glucose-Capillary 197 (H) 70 - 99 mg/dL    Comment: Glucose reference range applies only to samples taken after fasting for at least 8 hours.   Comment 1 Notify RN   Glucose, capillary     Status: Abnormal   Collection Time: 03/21/22  6:28 AM  Result Value Ref Range   Glucose-Capillary 240 (H) 70 - 99 mg/dL    Comment: Glucose reference range applies only to samples taken after fasting for at least 8 hours.   Comment 1 Notify RN   Glucose, capillary     Status: Abnormal   Collection Time: 03/21/22  8:26 AM  Result Value Ref Range   Glucose-Capillary 226 (H) 70 - 99 mg/dL    Comment: Glucose reference range applies only to samples taken after fasting for at least 8 hours.   Comment 1 Notify RN    Comment 2 Document in Chart   Glucose, capillary     Status: Abnormal   Collection Time: 03/21/22 12:48 PM  Result Value Ref Range   Glucose-Capillary 227 (H) 70 - 99 mg/dL    Comment: Glucose reference range applies only to samples taken after fasting for at least 8 hours.   Comment 1 Notify RN    Comment 2  Document in Chart   Glucose, capillary     Status: Abnormal   Collection Time: 03/21/22  5:03 PM  Result Value Ref Range   Glucose-Capillary 128 (H) 70 - 99 mg/dL    Comment: Glucose reference range applies only to samples taken after fasting for at least 8 hours.   Comment 1 Notify RN    Comment 2 Document in Chart   Glucose,  capillary     Status: Abnormal   Collection Time: 03/21/22  7:58 PM  Result Value Ref Range   Glucose-Capillary 141 (H) 70 - 99 mg/dL    Comment: Glucose reference range applies only to samples taken after fasting for at least 8 hours.   Comment 1 Notify RN    Comment 2 Document in Chart   Glucose, capillary     Status: Abnormal   Collection Time: 03/22/22  6:14 AM  Result Value Ref Range   Glucose-Capillary 286 (H) 70 - 99 mg/dL    Comment: Glucose reference range applies only to samples taken after fasting for at least 8 hours.   Comment 1 Notify RN    Comment 2 Document in Chart   CBC     Status: Abnormal   Collection Time: 03/22/22 10:19 AM  Result Value Ref Range   WBC 8.3 4.0 - 10.5 K/uL   RBC 4.26 4.22 - 5.81 MIL/uL   Hemoglobin 10.1 (L) 13.0 - 17.0 g/dL   HCT 33.0 (L) 39.0 - 52.0 %   MCV 77.5 (L) 80.0 - 100.0 fL   MCH 23.7 (L) 26.0 - 34.0 pg   MCHC 30.6 30.0 - 36.0 g/dL   RDW 18.6 (H) 11.5 - 15.5 %   Platelets 632 (H) 150 - 400 K/uL   nRBC 0.0 0.0 - 0.2 %    Comment: Performed at [facility] [address], [place], [place] [address]  Basic metabolic panel     Status: Abnormal   Collection Time: 03/22/22 10:19 AM  Result Value Ref Range   Sodium 139 135 - 145 mmol/L   Potassium 4.7 3.5 - 5.1 mmol/L   Chloride 108 98 - 111 mmol/L   CO2 24 22 - 32 mmol/L   Glucose, Bld 217 (H) 70 - 99 mg/dL    Comment: Glucose reference range applies only to samples taken after fasting for at least 8 hours.   BUN 33 (H) 8 - 23 mg/dL   Creatinine, Ser 1.36 (H) 0.61 - 1.24 mg/dL   Calcium 8.6 (L) 8.9 - 10.3 mg/dL   GFR, Estimated 53 (L) >60 mL/min     Comment: (NOTE) Calculated using the CKD-EPI Creatinine Equation (2021)    Anion gap 7 5 - 15    Comment: Performed at [facility] [address] [place], [place] [address]   No results found.    Blood pressure (!) 95/51, pulse 76, temperature 97.7 F (36.5 C), resp. rate 20, height '5\' 5"'$  (1.651 m), weight 55.7 kg, SpO2 99 %.  Medical Problem List and Plan: 1. Functional deficits secondary to right occipital infarct d/t right PCA stenosis/occlusion  -patient may shower  -ELOS/Goals: 14-17 days, supervision to min assist with PT and OT, supervision to mod I with SLP. 2.  Antithrombotics: -DVT/anticoagulation:  Pharmaceutical: Heparin  -antiplatelet therapy: Aspirin 325 mg daily plus Plavix 75 mg daily for 3 months then aspirin alone  3. Pain Management: Tylenol, Lidoderm as needed  -Voltaren gel  4. Mood/Behavior/Sleep: LCSW to evaluate and provide emotional support  -antipsychotic agents: n/a  5. Neuropsych/cognition: This patient is capable of making decisions on his own behalf.  6. Skin/Wound Care: Routine skin care checks  7. Fluids/Electrolytes/Nutrition: Strict I's and O's and follow-up chemistries  -continue tube feeds via Cortrak for now due to malnutrition  -continue Megace 400 mg daily  -continue Prosource daily  -Dysphagia 2 diet, thin liquids; advance per SLP recs  -Continue pancreatic enzymes q  6 hours  -continue probiotics  -continue Protonix for GI prophylaxis  8: VT arrest: Likely due to prior MI versus myocarditis status post EP eval  -follow-up with cardiolgy (meds as per #10)   9: CAD status post left heart catheterization  -Continue aspirin and statin  10: Acute on chronic systolic heart failure: currently not requiring diuretic  -Daily weight  -Continue Entresto 24-26 mg BID  -Continue dapagliflozin 10 mg daily  -Continue spironolactone 12.5 mg daily  -continue amiodarone 400 mg BID  -continue carvedilol 3.125 mg BID  11:  Cardiomyopathy: Likely due to myocarditis versus prior MI  12: Bilateral pneumonia; likely aspiration pneumonitis  -completed antibiotics  -CT chest 12/30: R>L bronchopneumonia, chronic sarcoidosis  13: Intraductal papillary mucinous neoplasms of the pancreas status post Whipple procedure 2023  -Continue pancreatic enzymes  -Follow-up with [name]  14: Multiple myeloma with AL amyloidosis: stable; follow-up [name]  15: Influenza A: Status post oseltamivir x 5 days this admission  16: DM: continue CBGs q AC and q HS  -continue SSI  -continue Novolog 5 units with meals  -continue Semglee 15 units daily  17: Oral thrush: continue nystatin QID  18: OAB: continue Myrbetriq  19: Hyperlipidemia: continue Crestor 10 mg  20: Low testosterone: continue Androgel q HS  21: Right foot drop/R common peroneal neuropathy: continue AFO  22: Hard of hearing: wears hearing aids  23: Right parotid gland lesion seen incidentally on MRI of brain  -ENT referral as outpatient  24: Right cranial nerve VI palsy: continue right eye patch  25: Code status: DNR as above    Barbie Banner, PA-C 03/22/2022

## 2022-03-22 NOTE — TOC Transition Note (Signed)
Transition of Care [facility]) - CM/SW Discharge Note   Patient Details  Name: William Barron MRN: [id number] Date of Birth: 1945/10/12  Transition of Care [facility]) CM/SW Contact:  Erenest Rasher, RN Phone Number: 03/22/2022, 2:07 PM   Clinical Narrative:    HF TOC discussed with HF provider. Pt will not need Life Vest. Plan dc to CIR today.    Final next level of care: IP Rehab Facility Barriers to Discharge: No Barriers Identified   Patient Goals and CMS Choice CMS Medicare.gov Compare Post Acute Care list provided to:: Patient Choice offered to / list presented to : Patient  Discharge Placement    Discharge Plan and Services Additional resources added to the After Visit Summary for   In-house Referral: Clinical Social Work   Post Acute Care Choice: IP Rehab                   Social Determinants of Health ([place]) Interventions [place]: No Food Insecurity (03/09/2022)  Housing: Low Risk  (03/09/2022)  Transportation Needs: No Transportation Needs (03/09/2022)  Utilities: Not At Risk (03/09/2022)  Tobacco Use: Medium Risk (03/11/2022)     Readmission Risk Interventions     No data to display

## 2022-03-22 NOTE — H&P (Signed)
Physical Medicine and Rehabilitation Admission H&P     CC: Functional deficits secondary to right PCA territory stroke   HPI: William Barron is a 77 year old male who presented to [facility] emergency department on 03/05/2022 stating he awoke that morning with headache and vision disturbances.  On exam, the patient had a cranial nerve VII palsy and dilated pupil on the right and possible left-sided visual field deficit.  CTA of the head and neck performed.  Discussed with Dr. Leonel Ramsay and CT scan of the head in the emergency department demonstrated an acute/subacute right occipital stroke.  Out of window for tenecteplase.  Concern for cardioembolic source.     He was admitted to the hospitalist service.  He reports chronic right lower extremity weakness and foot drop.  Echocardiogram revealed EF of 30 to 35%.  This was significantly reduced as compared to previous echocardiogram in September 2023 with an EF of approximately 55 to 60%.  Cardiology was consulted on 12/24.  No acute ischemic changes on EKG.  Carvedilol 3.125 mg started twice daily.  Other goal-directed medical therapy adjusted over the next several days.  He was started on losartan 25 mg daily on 12/25.  He was started on aspirin 325 mg daily along with Plavix 75 mg for 3 months with recommendations to continue aspirin alone or after.  Symptoms improved although he still complained of generalized weakness but no focal neurologic deficits. Lower extremity venous Doppler studies were negative for DVT.    On 12/26 he reported a cough and was diagnosed with influenza A.  Treated with Tamiflu.  Failed swallow eval and core track was placed 1/3.  Began tube feeds with protein supplements and continued on Creon for pancreatic insufficiency.  Started on Farxiga 10 mg daily by heart failure team.  Did not require diuretics.  Underwent CT scanning of the chest which revealed bilateral bronchopneumonia. CODE BLUE was called after  the patient was found unresponsive and pulseless on around 0445 hrs. on 12/28.  ACLS protocol was underway with ROSC in approximately 2 minutes.  Monitor with ventricular tachycardia.  Placed on amiodarone drip and moved to the ICU with CCM consultation.  Underwent right and left heart cath.  Mild to moderate scattered atherosclerosis.  The left ventricular systolic function is normal.  No hemodynamically significant lesions.  Continue aggressive medical therapy. Started on spirinolactone.  Cardiac MRI performed and consistent with myocarditis and no amyloid involvement.  Electrophysiology consultation obtained.  Noted to have oral thrush on 12/30 and started on nystatin.  Entresto started  1/04 after improvement in serum creatinine. Losartan discontinued.  Barium swallow completed which showed concern for possible proximal esophageal stricture.  Continue dysphagia 2 diet, thin liquids, medications crushed pured.   Palliative care consultation obtained on 1/1.  Palliative care nurse practitioner, [name], discussed and reviewed CODE STATUS on 1/3.  "In the event of cardiac arrest (no pulse), family agrees to DNR status with no CPR, defibrillation, ACLS medications or intubation."   The patient requires inpatient physical medicine and rehabilitation evaluations and treatment secondary to dysfunction due to right PCA territory stroke, VT arrest, acute heart failure, debility and failure to thrive.     Medical history is significant for intraductal papillary mucinous neoplasms of the pancreas status post Whipple procedure 2023 with pancreatic insufficiency; multiple myeloma with AL amyloidosis; bilateral lower extremity chronic sensorimotor axonal polyneuropathy status post NCV and EMG per Dr. Amalia Hailey; status post double arthrodesis right foot February 2023 by Dr. 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 Jeffie Pollock; low testosterone.     Review of Systems  Constitutional:  Positive for weight loss.  HENT:         Dry mouth  Eyes:  Positive for double vision.  Respiratory:  Positive for cough.   Cardiovascular:  Negative for chest pain.  Gastrointestinal:  Negative for nausea and vomiting.  Genitourinary:  Negative for dysuria.  Musculoskeletal:  Positive for falls and myalgias.  Skin:  Negative for itching.  Neurological:  Positive for tremors, focal weakness and weakness. Negative for headaches.  Psychiatric/Behavioral:  Negative for depression.         Past Medical History:  Diagnosis Date   Amyloidosis ([place])     Diabetes mellitus without complication ([place])     Feeding tube dysfunction, initial encounter 08/12/2021   GERD (gastroesophageal reflux disease)     Hearing loss      Has hearing aids   History of kidney stones     Hyperlipidemia     Jejunostomy tube present ([place])     Multiple myeloma ([facility])      and amylodosis    Nephrolithiasis     Occasional tremors     Pancreatic insufficiency     Pneumonia     Prostate cancer ([place])     Prostatitis      acute and chronic   Skin cancer           Past Surgical History:  Procedure Laterality Date   APPENDECTOMY       CARDIAC CATHETERIZATION N/A 06/13/2015    Procedure: Left Heart Cath and Coronary Angiography;  Surgeon: Jettie Booze, MD;  Location: [facility];  Service: Cardiovascular;  Laterality: N/A;   COLONOSCOPY   04/07/2000   ELBOW SURGERY   03/15/2001    right   ESOPHAGOGASTRODUODENOSCOPY (EGD) WITH PROPOFOL N/A 09/07/2021    Procedure: ESOPHAGOGASTRODUODENOSCOPY (EGD) WITH PROPOFOL;  Surgeon: Irene Shipper, MD;  Location: [place] [facility];  Service: Gastroenterology;  Laterality: N/A;   EXTRACORPOREAL SHOCK WAVE LITHOTRIPSY Left 07/17/2018    Procedure: EXTRACORPOREAL SHOCK WAVE LITHOTRIPSY (ESWL);  Surgeon: [name], MD;   Location: [facility];  Service: Urology;  Laterality: Left;   EXTRACORPOREAL SHOCK WAVE LITHOTRIPSY Right 12/08/2020    Procedure: RIGHT EXTRACORPOREAL SHOCK WAVE LITHOTRIPSY (ESWL);  Surgeon: Raynelle Bring, MD;  Location: [facility];  Service: Urology;  Laterality: Right;   FOOT ARTHRODESIS Right 04/15/2021    Procedure: RIGHT SUBTALAR AND TALONAVICULAR FUSION;  Surgeon: Newt Minion, MD;  Location: [facility];  Service: Orthopedics;  Laterality: Right;   HERNIA REPAIR       IR MECH REMOV OBSTRUC MAT ANY COLON TUBE W/FLUORO   08/27/2021   IR REPLC GASTRO/COLONIC TUBE PERCUT W/FLUORO   09/08/2021   RIGHT/LEFT HEART CATH AND CORONARY ANGIOGRAPHY N/A 03/11/2022    Procedure: RIGHT/LEFT HEART CATH AND CORONARY ANGIOGRAPHY;  Surgeon: Jettie Booze, MD;  Location: [facility];  Service: Cardiovascular;  Laterality: N/A;   ROTATOR CUFF REPAIR       WHIPPLE PROCEDURE   07/13/2021         Family History  Problem Relation Age of Onset   Pancreatic cancer Father     Prostate cancer Father     Parkinsonism Mother     CAD Brother 93   Cancer Paternal Grandmother  Breast cancer Neg Hx     Colon cancer Neg Hx      Social History:  reports that he quit smoking about 45 years ago. His smoking use included cigarettes. He has a 60.00 pack-year smoking history. He has never used smokeless tobacco. He reports current alcohol use of about 3.0 standard drinks of alcohol per week. He reports that he does not use drugs. Allergies: No Known Allergies       Medications Prior to Admission  Medication Sig Dispense Refill   Cyanocobalamin (B-12 PO) Take 1 tablet by mouth in the morning.       insulin aspart (NOVOLOG FLEXPEN) 100 UNIT/ML FlexPen Inject 8 Units into the skin 3 (three) times daily before meals.       lipase/protease/amylase (CREON) 36000 UNITS CPEP capsule Take 3 capsules (108,000 Units total) by mouth 3 (three) times daily with meals. May also take 1 capsule (36,000 Units  total) as needed (with snacks). (Patient taking differently: Take 2-3 capsules (72000-108000 units) by mouth 3 (three) times daily with meals. May also take 1 capsule (36,000 units) as needed (with snacks).) 330 capsule 11   pantoprazole sodium (PROTONIX) 40 mg Take 40 mg by mouth daily. (Patient taking differently: Take 40 mg by mouth in the morning.) 30 packet     Testosterone 20.25 MG/ACT (1.62%) GEL Apply 4 Pump topically at bedtime.       Vibegron (GEMTESA) 75 MG TABS Take 75 mg by mouth in the morning.       BD PEN NEEDLE NANO U/F 32G X 4 MM MISC USE AS DIRECTED WITH INSULIN PEN THREE TIMES DAILY 200 each 12   Continuous Blood Gluc Receiver (FREESTYLE LIBRE 2 READER) DEVI Use as instructed to check blood sugars 1 each 0   Continuous Blood Gluc Sensor (FREESTYLE LIBRE 3 SENSOR) MISC Place 1 sensor on the skin every 14 days. Use to check glucose continuously 6 each 3   insulin degludec (TRESIBA FLEXTOUCH) 100 UNIT/ML FlexTouch Pen Inject 15 Units into the skin daily. (Patient taking differently: Inject 6 Units into the skin in the morning.) 15 mL 1          Home: Home Living Family/patient expects to be discharged to:: Unsure Living Arrangements: Spouse/significant other Available Help at Discharge: Family, Available 24 hours/day Type of Home: House Home Access: Stairs to enter [employer] of Steps: 4 Entrance Stairs-Rails: Right Home Layout: Two level, 1/2 bath on main level Alternate Level Stairs-Number of Steps: has chair lift Alternate Level Stairs-Rails: Left Bathroom Shower/Tub: Multimedia programmer: Standard Bathroom Accessibility: Yes Home Equipment: Shower seat, Conservation officer, nature (2 wheels), Rollator (4 wheels), BSC/3in1, Wheelchair - manual Additional Comments: Using RW to ambulate at home. Wife cooks meals.  Lives With: Spouse   Functional History: Prior Function Prior Level of Function : Needs assist, History of Falls (last six months) Mobility  Comments: Ambulating with RW at home, one fall 3 mo ago. ADLs Comments: Wife assists with cooking/cleaning. Pt able to bath dress ind.   Functional Status:  Mobility: Bed Mobility Overal bed mobility: Needs Assistance Bed Mobility: Supine to Sit Supine to sit: Min guard, HOB elevated Sit to supine: Min guard, HOB elevated General bed mobility comments: Min guard for safety. use of rails and increased time. Transfers Overall transfer level: Needs assistance Equipment used: Rolling walker (2 wheels) Transfers: Sit to/from Stand Sit to Stand: Mod assist, +2 safety/equipment Bed to/from chair/wheelchair/BSC transfer type:: Step pivot Step pivot transfers: Max assist (Assistance for  RW management, cuing for sequencing & to reach back for recliner prior to sitting.) General transfer comment: Mod A to power to standing with cues for hand placement/technique. Stood from [employer], from chair x2, flexed posture at hips/knees and tremors present throughout. Ambulation/Gait Ambulation/Gait assistance: Mod assist Gait Distance (Feet): 8 Feet (+8' + 10') Assistive device: Rolling walker (2 wheels) Gait Pattern/deviations: Narrow base of support, Decreased stride length, Trunk flexed, Knee flexed in stance - right, Knee flexed in stance - left, Shuffle, Step-to pattern, Decreased step length - left General Gait Details: Slow, unsteady gait with decreased step length LLE, flexed posture throughout and assist needed for forward momentum and RW management. 2 seated rest breaks. 3/4 DOE. Sp02 dropped to 86% on last bout on 3L/min 02 [place]. Cues for pursed lip breathing. Gait velocity: decreased Gait velocity interpretation: <1.31 ft/sec, indicative of household ambulator Pre-gait activities: Pt initially is very wobbly but is able to get balance with physical assistance.   ADL: ADL Overall ADL's : Needs assistance/impaired Eating/Feeding: Minimal assistance, Sitting Eating/Feeding Details (indicate cue type  and reason): requires min assist to use fork with R hand, limited by tremors Grooming: Sitting, Supervision/safety Upper Body Bathing: Supervision/ safety, Sitting Lower Body Bathing: Minimal assistance, Sit to/from stand Upper Body Dressing : Supervision/safety, Sitting Lower Body Dressing: Minimal assistance, Sitting/lateral leans, Sit to/from stand Lower Body Dressing Details (indicate cue type and reason): able to manage socks, min assist in standing and relies on BUE support Toilet Transfer: Moderate assistance, +2 for physical assistance, +2 for safety/equipment, Ambulation, BSC/3in1, Rolling walker (2 wheels) Toilet Transfer Details (indicate cue type and reason): simulated Functional mobility during ADLs: Minimal assistance, Rolling walker (2 wheels), Cueing for safety General ADL Comments: sit<>stand 3x with mod A +2. tolerated 28f ambulation with RW 3x. limited by activity tolerance and weakness   Cognition: Cognition Overall Cognitive Status: Impaired/Different from baseline Arousal/Alertness: Awake/alert Orientation Level: Oriented X4 Year: 2023 Month: December Day of Week: Correct Attention: Sustained Sustained Attention: Appears intact Memory: Impaired Memory Impairment: Retrieval deficit Awareness: Appears intact Problem Solving: Appears intact (stated he "knows he needs walker for further distances") Safety/Judgment: Other (comment) (appears grossly WFL) Cognition Arousal/Alertness: Awake/alert Behavior During Therapy: WFL for tasks assessed/performed Overall Cognitive Status: Impaired/Different from baseline Area of Impairment: Attention, Memory, Following commands, Safety/judgement, Awareness, Problem solving Current Attention Level: Selective Memory: Decreased recall of precautions, Decreased short-term memory Following Commands: Follows one step commands consistently, Follows one step commands with increased time, Follows multi-step commands  inconsistently Safety/Judgement: Decreased awareness of safety, Decreased awareness of deficits Awareness: Emergent Problem Solving: Slow processing, Requires verbal cues, Difficulty sequencing, Decreased initiation General Comments: Less verbal today, concentrating on breathing.   Physical Exam: Blood pressure (!) 95/51, pulse 76, temperature 97.7 F (36.5 C), resp. rate 20, height '5\' 5"'$  (1.651 m), weight 55.7 kg, SpO2 99 %. Physical Exam Constitutional:      General: He is not in acute distress.    Appearance: He is ill-appearing.  HENT:     Head:     Comments: NGT in place    Right Ear: External ear normal.     Left Ear: External ear normal.     Nose: Nose normal.     Mouth/Throat:     Comments: Mouth and oral cavity with residual barium, thrush Eyes:     General: No scleral icterus.    Conjunctiva/sclera: Conjunctivae normal.  Cardiovascular:     Rate and Rhythm: Normal rate and regular rhythm.  Heart sounds: No murmur heard.    No gallop.  Pulmonary:     Effort: Pulmonary effort is normal. No respiratory distress.     Breath sounds: Normal breath sounds.  Abdominal:     General: Bowel sounds are normal. There is no distension.     Palpations: Abdomen is soft.     Tenderness: There is no abdominal tenderness.  Musculoskeletal:     Cervical back: Normal range of motion.  Skin:    General: Skin is warm and dry.     Findings: Bruising present.  Neurological:     Comments: Pt is alert. Speech dysarthric. Right CN VI palsy with diplopia with rightward gaze. Seems to attend to all visual fields. Fair insight and awareness.  Moves all 4's, RUE 4+/5. LUE 4/5. RLE 4/5 proximally but with ongoing, chronic foot drop. LLE 4/5. Sensory: sl decrease in LT in both feet. DTR's 1+. Mild intentional tremor in UE's but inconsistent.  Psychiatric:     Comments: Generally pleasant and appropriate.        Lab Results Last 48 Hours        Results for orders placed or performed during  the hospital encounter of 03/05/22 (from the past 48 hour(s))  Glucose, capillary     Status: Abnormal    Collection Time: 03/20/22 12:17 PM  Result Value Ref Range    Glucose-Capillary 271 (H) 70 - 99 mg/dL      Comment: Glucose reference range applies only to samples taken after fasting for at least 8 hours.    Comment 1 Notify RN      Comment 2 Document in Chart    Glucose, capillary     Status: Abnormal    Collection Time: 03/20/22  3:54 PM  Result Value Ref Range    Glucose-Capillary 205 (H) 70 - 99 mg/dL      Comment: Glucose reference range applies only to samples taken after fasting for at least 8 hours.    Comment 1 Notify RN      Comment 2 Document in Chart    Glucose, capillary     Status: Abnormal    Collection Time: 03/20/22  8:23 PM  Result Value Ref Range    Glucose-Capillary 179 (H) 70 - 99 mg/dL      Comment: Glucose reference range applies only to samples taken after fasting for at least 8 hours.  Glucose, capillary     Status: Abnormal    Collection Time: 03/20/22 11:37 PM  Result Value Ref Range    Glucose-Capillary 197 (H) 70 - 99 mg/dL      Comment: Glucose reference range applies only to samples taken after fasting for at least 8 hours.    Comment 1 Notify RN    Glucose, capillary     Status: Abnormal    Collection Time: 03/21/22  6:28 AM  Result Value Ref Range    Glucose-Capillary 240 (H) 70 - 99 mg/dL      Comment: Glucose reference range applies only to samples taken after fasting for at least 8 hours.    Comment 1 Notify RN    Glucose, capillary     Status: Abnormal    Collection Time: 03/21/22  8:26 AM  Result Value Ref Range    Glucose-Capillary 226 (H) 70 - 99 mg/dL      Comment: Glucose reference range applies only to samples taken after fasting for at least 8 hours.    Comment 1 Notify RN  Comment 2 Document in Chart    Glucose, capillary     Status: Abnormal    Collection Time: 03/21/22 12:48 PM  Result Value Ref Range     Glucose-Capillary 227 (H) 70 - 99 mg/dL      Comment: Glucose reference range applies only to samples taken after fasting for at least 8 hours.    Comment 1 Notify RN      Comment 2 Document in Chart    Glucose, capillary     Status: Abnormal    Collection Time: 03/21/22  5:03 PM  Result Value Ref Range    Glucose-Capillary 128 (H) 70 - 99 mg/dL      Comment: Glucose reference range applies only to samples taken after fasting for at least 8 hours.    Comment 1 Notify RN      Comment 2 Document in Chart    Glucose, capillary     Status: Abnormal    Collection Time: 03/21/22  7:58 PM  Result Value Ref Range    Glucose-Capillary 141 (H) 70 - 99 mg/dL      Comment: Glucose reference range applies only to samples taken after fasting for at least 8 hours.    Comment 1 Notify RN      Comment 2 Document in Chart    Glucose, capillary     Status: Abnormal    Collection Time: 03/22/22  6:14 AM  Result Value Ref Range    Glucose-Capillary 286 (H) 70 - 99 mg/dL      Comment: Glucose reference range applies only to samples taken after fasting for at least 8 hours.    Comment 1 Notify RN      Comment 2 Document in Chart    CBC     Status: Abnormal    Collection Time: 03/22/22 10:19 AM  Result Value Ref Range    WBC 8.3 4.0 - 10.5 K/uL    RBC 4.26 4.22 - 5.81 MIL/uL    Hemoglobin 10.1 (L) 13.0 - 17.0 g/dL    HCT 33.0 (L) 39.0 - 52.0 %    MCV 77.5 (L) 80.0 - 100.0 fL    MCH 23.7 (L) 26.0 - 34.0 pg    MCHC 30.6 30.0 - 36.0 g/dL    RDW 18.6 (H) 11.5 - 15.5 %    Platelets 632 (H) 150 - 400 K/uL    nRBC 0.0 0.0 - 0.2 %      Comment: Performed at [facility] [address], [place], [place] [address]  Basic metabolic panel     Status: Abnormal    Collection Time: 03/22/22 10:19 AM  Result Value Ref Range    Sodium 139 135 - 145 mmol/L    Potassium 4.7 3.5 - 5.1 mmol/L    Chloride 108 98 - 111 mmol/L    CO2 24 22 - 32 mmol/L    Glucose, Bld 217 (H) 70 - 99 mg/dL      Comment: Glucose  reference range applies only to samples taken after fasting for at least 8 hours.    BUN 33 (H) 8 - 23 mg/dL    Creatinine, Ser 1.36 (H) 0.61 - 1.24 mg/dL    Calcium 8.6 (L) 8.9 - 10.3 mg/dL    GFR, Estimated 53 (L) >60 mL/min      Comment: (NOTE) Calculated using the CKD-EPI Creatinine Equation (2021)      Anion gap 7 5 - 15      Comment: Performed at  [facility], [place] [address], [place], [place] [address]      Imaging Results (Last 48 hours)  No results found.         Blood pressure (!) 95/51, pulse 76, temperature 97.7 F (36.5 C), resp. rate 20, height '5\' 5"'$  (1.651 m), weight 55.7 kg, SpO2 99 %.   Medical Problem List and Plan: 1. Functional deficits secondary to right occipital infarct d/t right PCA stenosis/occlusion, multiple medical conditions             -patient may shower             -ELOS/Goals: 14-17 days, supervision to min assist with PT and OT, supervision to mod I with SLP. 2.  Antithrombotics: -DVT/anticoagulation:  Pharmaceutical: Heparin             -antiplatelet therapy: Aspirin 325 mg daily plus Plavix 75 mg daily for 3 months then aspirin alone   3. Pain Management: Tylenol, Lidoderm as needed             -Voltaren gel   4. Mood/Behavior/Sleep: LCSW to evaluate and provide emotional support             -antipsychotic agents: n/a   5. Neuropsych/cognition: This patient is capable of making decisions on his own behalf.   6. Skin/Wound Care: Routine skin care checks   7. Fluids/Electrolytes/Nutrition: Strict I's and O's and follow-up chemistries             -continue tube feeds via Cortrak for now due to malnutrition             -continue Megace 400 mg daily             -continue Prosource daily             -Dysphagia 2 diet, thin liquids; advance per SLP recs             -Continue pancreatic enzymes q 6 hours             -continue probiotics             -continue Protonix for GI prophylaxis   8: VT arrest: Likely due to prior MI versus  myocarditis status post EP eval             -follow-up with cardiolgy (meds as per #10)              9: CAD status post left heart catheterization             -Continue aspirin and statin   10: Acute on chronic systolic heart failure: currently not requiring diuretic             -Daily weight             -Continue Entresto 24-26 mg BID             -Continue dapagliflozin 10 mg daily             -Continue spironolactone 12.5 mg daily             -continue amiodarone 400 mg BID             -continue carvedilol 3.125 mg BID   11: Cardiomyopathy: Likely due to myocarditis versus prior MI   12: Bilateral pneumonia; likely aspiration pneumonitis             -completed antibiotics             -CT chest 12/30:  R>L bronchopneumonia, chronic sarcoidosis   13: Intraductal papillary mucinous neoplasms of the pancreas status post Whipple procedure 2023             -Continue pancreatic enzymes             -Follow-up with [name]   14: Multiple myeloma with AL amyloidosis: stable; follow-up [name]   15: Influenza A: Status post oseltamivir x 5 days this admission   16: DM: continue CBGs q AC and q HS             -continue SSI             -continue Novolog 5 units with meals             -continue Semglee 15 units daily   17: Oral thrush: continue nystatin QID   18: OAB: continue Myrbetriq   19: Hyperlipidemia: continue Crestor 10 mg   20: Low testosterone: continue Androgel q HS   21: Right foot drop/R common peroneal neuropathy: continue AFO   22: Hard of hearing: wears hearing aids   23: Right parotid gland lesion seen incidentally on MRI of brain             -ENT referral as outpatient   24: Right cranial nerve VI palsy: continue right eye patch   25: Code status: DNR as above     Barbie Banner, PA-C 03/22/2022  I have personally performed a face to face diagnostic evaluation of this patient and formulated the key components of the plan.  Additionally, I have personally  reviewed laboratory data, imaging studies, as well as relevant notes and concur with the physician assistant's documentation above.  The patient's status has not changed from the original H&P.  Any changes in documentation from the acute care chart have been noted above.  Meredith Staggers, MD, [facility]

## 2022-03-22 NOTE — Progress Notes (Signed)
Inpatient Rehab Admissions Coordinator:    I have a CIR bed for this pt. Today. Will need life vest, loop recorder, and barium swallow before he comes. Dr. Lonny Prude to d/c Pt. To CIR today. RN may call report to [place], [place], [place] Admissions Coordinator  [phone] (office)   Clemens Catholic, [place], [place] Admissions Coordinator  [phone] (office)

## 2022-03-22 NOTE — Discharge Instructions (Addendum)
William Barron,  You were in the hospital with a stroke. While in the hospital, your heart rhythm became unstable and you required CPR. Your heart was found to have significantly reduced function and your medications have been adjusted. The cardiologist has recommended against a vest to treat potential repeat of abnormal heart rhythms because of your goals of care. The physical therapist has recommended inpatient rehabilitation.

## 2022-03-22 NOTE — Progress Notes (Signed)
Physical Therapy Treatment Patient Details Name: William Barron MRN: [id number] DOB: 10/05/1945 Today's Date: 03/22/2022   History of Present Illness 77 year old male admitted 12/22 with right-sided headache and blurred vision. +Acute right PCA stroke. During hospitalization pt found to be Flu + on 03/07/22. Pt also had code blue called on 03/11/22. Pt received compressions before regaining ROSC s/p VT arrest. Pt underwent cardiac cath on 03/11/22. PMHX:  amyloidosis, multiple myeloma, history of prostate cancer, history of pancreatic cancer status post Whipple, dyslipidemia, diabetes.    PT Comments    Patient progressing slowly towards PT goals. Session focused on progressive ambulation and endurance training. Continues to require Min-Mod A to stand from all surfaces with repetition of cues to follow commands for safe technique/hand placement. Continues to have DOE esp with mobility needing 2 seated rest breaks during gait training, but this seemed improved from prior session. Also complains of dry mouth. Per wife, pt has not been feeding himself. Encouraged wife/pt to allow pt to feed self and do ADL tasks for himself to improve overall independence and function. Also encouraged less hours up in chair at a time and getting up 3 times/day for ~ 1 hour if tolerated due to sore on bottom. Will continue to follow.   Recommendations for follow up therapy are one component of a multi-disciplinary discharge planning process, led by the attending physician.  Recommendations may be updated based on patient status, additional functional criteria and insurance authorization.  Follow Up Recommendations  Acute inpatient rehab (3hours/day) Can patient physically be transported by private vehicle: Yes   Assistance Recommended at Discharge Frequent or constant Supervision/Assistance  Patient can return home with the following A lot of help with walking and/or transfers;A little help with  bathing/dressing/bathroom;Assistance with cooking/housework;Help with stairs or ramp for entrance;Assist for transportation   Equipment Recommendations  None recommended by PT    Recommendations for Other Services       Precautions / Restrictions Precautions Precautions: Fall;Other (comment) Precaution Comments: vision impaired, feeding tube Required Braces or Orthoses: Other Brace Other Brace: RLE AFO and shoes, pt and son think he does worse with AFO on Restrictions Weight Bearing Restrictions: No     Mobility  Bed Mobility Overal bed mobility: Needs Assistance Bed Mobility: Supine to Sit     Supine to sit: Min guard, HOB elevated     General bed mobility comments: Min guard for safety. use of rails and increased time.    Transfers Overall transfer level: Needs assistance Equipment used: Rolling walker (2 wheels) Transfers: Sit to/from Stand Sit to Stand: Min assist, +2 physical assistance, Mod assist           General transfer comment: MIn- Mod A to power to standing with cues for hand placement/technique. Stood from [employer], from chair x2, flexed posture at hips/knees and tremors present throughout. POor carry over for proper technique.    Ambulation/Gait Ambulation/Gait assistance: +2 safety/equipment, Min assist Gait Distance (Feet): 8 Feet (+ 8' + 12') Assistive device: Rolling walker (2 wheels) Gait Pattern/deviations: Narrow base of support, Decreased stride length, Trunk flexed, Knee flexed in stance - right, Knee flexed in stance - left, Shuffle, Step-to pattern, Decreased step length - left Gait velocity: decreased Gait velocity interpretation: <1.31 ft/sec, indicative of household ambulator   General Gait Details: Slow, unsteady gait with decreased step length LLE, flexed posture throughout and assist needed for forward momentum and RW management. 2 seated rest breaks. 3/4 DOE. Sp02 dropped to 86% on 3L/min 02  [place]. Cues for pursed lip  breathing.   Stairs             Wheelchair Mobility    Modified Rankin (Stroke Patients Only) Modified Rankin (Stroke Patients Only) Pre-Morbid Rankin Score: Moderate disability Modified Rankin: Moderately severe disability     Balance Overall balance assessment: Needs assistance Sitting-balance support: Feet supported, No upper extremity supported Sitting balance-Leahy Scale: Fair Sitting balance - Comments: Prefers UE support, supervision for static sitting balance   Standing balance support: During functional activity, Bilateral upper extremity supported, Reliant on assistive device for balance Standing balance-Leahy Scale: Poor Standing balance comment: relies on BUE and external support                            Cognition Arousal/Alertness: Awake/alert Behavior During Therapy: Flat affect Overall Cognitive Status: Impaired/Different from baseline Area of Impairment: Attention, Memory, Following commands, Safety/judgement, Awareness, Problem solving                   Current Attention Level: Selective Memory: Decreased recall of precautions, Decreased short-term memory Following Commands: Follows one step commands consistently, Follows one step commands with increased time, Follows multi-step commands inconsistently Safety/Judgement: Decreased awareness of safety, Decreased awareness of deficits Awareness: Emergent Problem Solving: Slow processing, Requires verbal cues, Difficulty sequencing, Decreased initiation General Comments: Less verbal today, concentrating on breathing. Needs repetition of cues to follow commands for tasks with poor carry over today        Exercises      General Comments General comments (skin integrity, edema, etc.): Wife present during session.      Pertinent Vitals/Pain Pain Assessment Pain Assessment: Faces Faces Pain Scale: Hurts whole lot Pain Location: bottom Pain Descriptors / Indicators: Grimacing,  Discomfort, Sore Pain Intervention(s): Monitored during session, Repositioned    Home Living                          Prior Function            PT Goals (current goals can now be found in the care plan section) Progress towards PT goals: Progressing toward goals    Frequency    Min 4X/week      PT Plan Current plan remains appropriate    Co-evaluation              AM-PAC PT "6 Clicks" Mobility   Outcome Measure  Help needed turning from your back to your side while in a flat bed without using bedrails?: A Little Help needed moving from lying on your back to sitting on the side of a flat bed without using bedrails?: A Little Help needed moving to and from a bed to a chair (including a wheelchair)?: A Lot Help needed standing up from a chair using your arms (e.g., wheelchair or bedside chair)?: A Lot Help needed to walk in hospital room?: Total Help needed climbing 3-5 steps with a railing? : Total 6 Click Score: 12    End of Session Equipment Utilized During Treatment: Gait belt;Oxygen Activity Tolerance: Patient limited by fatigue Patient left: in chair;with call bell/phone within reach;with chair alarm set;with family/visitor present Nurse Communication: Mobility status PT Visit Diagnosis: Unsteadiness on feet (R26.81);Other abnormalities of gait and mobility (R26.89);Muscle weakness (generalized) (M62.81);History of falling (Z91.81);Difficulty in walking, not elsewhere classified (R26.2);Pain Pain - part of body:  (bottom)     Time: 1040-1110 PT Time Calculation (min) (ACUTE  ONLY): 30 min  Charges:  $Gait Training: 23-37 mins                     Marisa Severin, PT, DPT Acute Rehabilitation Services Secure chat preferred Office [place] 03/22/2022, 12:31 PM

## 2022-03-22 NOTE — Progress Notes (Signed)
Inpatient Rehabilitation Admission Medication Review by a Pharmacist  A complete drug regimen review was completed for this patient to identify any potential clinically significant medication issues.  High Risk Drug Classes Is patient taking? Indication by Medication  Antipsychotic {Receiving?:26196}   Anticoagulant {Receiving?:26196}   Antibiotic {Receiving?:26196} Nystatin - thrush   Opioid {Receiving?:26196}   Antiplatelet {Receiving?:26196} Aspirin, clopidogrel - stroke ppx  Hypoglycemics/insulin {Yes or No?:26198} Dapagliflozin - HF Insulin aspart, degludec - DM  Vasoactive Medication {Receiving?:26196} Amiodarone - hx VT arrest Entresto, spironolactone - HF, HTN  Chemotherapy No   Other {Yes or No?:26198} Vit B12 - supplement Jevity, ProSource - nourishment Creon - pancreatic supplement Megestrol - appetite stimulant Diclofenac gel - topical pain relief Pantoprazole - GERD ppx Rosuvastatin - HLD Testosterone - hormonal therapy Vibegron - overactive bladder      Type of Medication Issue Identified Description of Issue Recommendation(s)  Drug Interaction(s) (clinically significant)     Duplicate Therapy     Allergy     No Medication Administration End Date     Incorrect Dose     Additional Drug Therapy Needed     Significant med changes from prior encounter (inform family/care partners about these prior to discharge). Vibegron changed to mirabegron while inpatient due to formulary restrictions  Communicate medication changes with patient/family at discharge ***  Other       Clinically significant medication issues were identified that warrant physician communication and completion of prescribed/recommended actions by midnight of the next day:  {Yes or No?:26198}  Name of provider notified for urgent issues identified: ***   Provider Method of Notification: ***    Pharmacist comments:  Per neurology recommendations (from 03/09/22 sign-off note): aspirin '325mg'$   and clopidogrel '75mg'$  daily for 3 months due to intracranial stenosis/occlusion, then aspirin '81mg'$  daily monotherapy indefinitely    Time spent performing this drug regimen review (minutes): 20   Thank you for allowing pharmacy to be a part of this patient's care.  ***

## 2022-03-22 NOTE — Progress Notes (Addendum)
PMR Admission Coordinator Pre-Admission Assessment   Patient: William Barron is an 77 y.o., male MRN: [id number] DOB: Feb 28, 1946 Height: '5\' 5"'$  (165.1 cm) Weight: 55.7 kg   Insurance Information HMO:     PPO:      PCP:      IPA:      80/20: yes     OTHER:  PRIMARY: [facility]        Policy#: [id number]         Subscriber: Pt. Phone#: Verified online    Fax#: [phone] phone [phone] Pre-Cert#:  [id number]     Employer: [employer] with the [employer] emailed approval for admit 1/5. He is approved 03/22/2022-04/21/22. Requests updates every 2 weeks  Benefits:  Phone #:      Name:  Eff. Date: effective 03/19/21   Out of Pocket Max:  None      Life Max: N/A  CIR: 100%      SNF: Outpatient: Co-Pay: Home Health:      Co-Pay: none DME:      Co-Pay:  Providers: patient's choice  SECONDARY: medicare A/B     Policy#: [id number]  Phone#:    Financial Counselor:       Phone#:    The "Data Collection Information Summary" for patients in Inpatient Rehabilitation Facilities with attached "Privacy Act [place] Records" was provided and verbally reviewed with: Patient   Emergency Contact Information Contact Information       Name Relation Home Work Rowe [phone]   [phone]    DAVIE, CLAUD     [phone]           Current Medical History  Patient Admitting Diagnosis : CVA  H and P: Pt. Is a 77 year old male history of amyloidosis, multiple myeloma, history of prostate cancer, history of pancreatic cancer status post Whipple, dyslipidemia, diabetes insulin requiring presents to the [facility] ED on 03/05/22 with right-sided headache and blurred vision.  CT showed right PCA infarct.  CTA head and neck right PCA distal P3 occlusion, left VA origin stenosis.  MRI confirmed right PCA infarct.  EF 30 to 35%, LE venous Doppler no DVT.  LDL 66, K3.3, creatinine 0.89.Marland Kitchen Pt. With acute HFrEF. No prior history of reduced EF. Cardiology consulted.  Transthoracic Echocardiogram significant for LVEF of 30-35% with normal RV function. Left/right heart catheterization significant for moderate CAD but per cardiology, did not explain reduction in LVEF. Cardiac MRI confirmed an LVEF of 33% with diffuse hypokinesis in addition to RVEF of 46%. Although patient has a history of AL amyloidosis, per cardiology, findings are not consistent with cardiac involvement of AL amyloidosis. Cardiology recommended continue Entresto, Farxiga, spironolactone and Crestor. Pt also had code blue called on 03/11/22. Pt received compressions before regaining ROSC s/p VT arrest. Pt underwent cardiac cath on 03/11/22. Pt. Positive for Influenza A infection  During admission and completed oseltamivir course.Remains on droplets/contact precautions.  CXR showed bibasilar lung infiltrates and patient started empirically on antibiotics for possible infection  Pt. Seen by SLP  who recommend dysphagia 2 diet with thin liquids. Pt. With poor intake during admission, and had coretrak placed for nutrition support.  Pt. Seen by PT/OT and they recommend CIR to assist return to PLOF.     Complete NIHSS TOTAL: 0   Patient's medical record from [facility]  has been reviewed by the rehabilitation admission coordinator and physician.   Past Medical History      Past Medical History:  Diagnosis Date   Amyloidosis ([place])     Diabetes mellitus without complication ([place])     Feeding tube dysfunction, initial encounter 08/12/2021   GERD (gastroesophageal reflux disease)     Hearing loss      Has hearing aids   History of kidney stones     Hyperlipidemia     Jejunostomy tube present ([place])     Multiple myeloma ([facility])      and amylodosis    Nephrolithiasis     Occasional tremors     Pancreatic insufficiency     Pneumonia     Prostate cancer ([place])     Prostatitis      acute and chronic   Skin cancer        Has the patient had major surgery during 100 days prior to  admission? No   Family History   family history includes CAD (age of onset: 25) in his brother; Cancer in his paternal grandmother; Pancreatic cancer in his father; Parkinsonism in his mother; Prostate cancer in his father.   Current Medications   Current Facility-Administered Medications:    0.9 %  sodium chloride infusion, 250 mL, Intravenous, PRN, Jettie Booze, MD   acetaminophen (TYLENOL) tablet 650 mg, 650 mg, Oral, Q4H PRN, 650 mg at 03/12/22 1741 **OR** [DISCONTINUED] acetaminophen (TYLENOL) 160 MG/5ML solution 650 mg, 650 mg, Per Tube, Q4H PRN **OR** acetaminophen (TYLENOL) suppository 650 mg, 650 mg, Rectal, Q4H PRN, Larae Grooms S, MD   acidophilus (RISAQUAD) capsule 1 capsule, 1 capsule, Oral, Daily, Vann, Jessica U, DO, 1 capsule at 03/21/22 1002   amiodarone (PACERONE) tablet 400 mg, 400 mg, Oral, BID, 400 mg at 03/21/22 2150 **FOLLOWED BY** [START ON 03/26/2022] amiodarone (PACERONE) tablet 400 mg, 400 mg, Oral, Daily, O'Neal, Cassie Freer, MD   aspirin EC tablet 325 mg, 325 mg, Oral, Daily, Irish Lack, Jayadeep S, MD, 325 mg at 03/21/22 1002   clopidogrel (PLAVIX) tablet 75 mg, 75 mg, Oral, Daily, Larae Grooms S, MD, 75 mg at 03/21/22 1002   dapagliflozin propanediol (FARXIGA) tablet 10 mg, 10 mg, Oral, Daily, Irish Lack, Jayadeep S, MD, 10 mg at 03/21/22 1003   diclofenac Sodium (VOLTAREN) 1 % topical gel 2 g, 2 g, Topical, QID, Vann, Jessica U, DO, 2 g at 03/21/22 2151   feeding supplement (JEVITY 1.5 CAL/FIBER) liquid 1,000 mL, 1,000 mL, Per Tube, Continuous, Vann, Jessica U, DO, Last Rate: 50 mL/hr at 03/22/22 0543, 1,000 mL at 03/22/22 0543   feeding supplement (PROSource TF20) liquid 60 mL, 60 mL, Per Tube, Daily, Vann, Jessica U, DO, 60 mL at 03/21/22 1005   heparin injection 5,000 Units, 5,000 Units, Subcutaneous, Q8H, Jettie Booze, MD, 5,000 Units at 03/22/22 0514   insulin aspart (novoLOG) injection 0-9 Units, 0-9 Units, Subcutaneous, TID WC,  Jettie Booze, MD, 5 Units at 03/22/22 0650   insulin aspart (novoLOG) injection 5 Units, 5 Units, Subcutaneous, TID WC, Vann, Jessica U, DO, 5 Units at 03/21/22 1234   insulin glargine-yfgn (SEMGLEE) injection 15 Units, 15 Units, Subcutaneous, Daily, Nettey, Ralph A, MD   ipratropium-albuterol (DUONEB) 0.5-2.5 (3) MG/3ML nebulizer solution 3 mL, 3 mL, Nebulization, Q4H PRN, Larae Grooms S, MD   lidocaine (LIDODERM) 5 % 1 patch, 1 patch, Transdermal, Q24H, Dana Allan I, MD, 1 patch at 03/20/22 1830   lipase/protease/amylase (CREON) capsule 72,000 Units, 72,000 Units, Oral, Q6H, Vann, Jessica U, DO, 72,000 Units at 03/20/22 1831   megestrol (MEGACE) 400 MG/10ML suspension 400 mg, 400 mg, Oral, Daily,  Dana Allan I, MD, 400 mg at 03/20/22 0948   mirabegron ER (MYRBETRIQ) tablet 25 mg, 25 mg, Oral, Daily, Irish Lack, Jayadeep S, MD, 25 mg at 03/21/22 1002   nystatin (MYCOSTATIN) 100000 UNIT/ML suspension 500,000 Units, 5 mL, Oral, QID, Dana Allan I, MD, 500,000 Units at 03/21/22 2150   Oral care mouth rinse, 15 mL, Mouth Rinse, 4 times per day, Earnie Larsson, NP   Oral care mouth rinse, 15 mL, Mouth Rinse, PRN, Earnie Larsson, NP   pantoprazole (PROTONIX) EC tablet 40 mg, 40 mg, Oral, BID, Vann, Jessica U, DO, 40 mg at 03/21/22 2150   rosuvastatin (CRESTOR) tablet 10 mg, 10 mg, Oral, Daily, Larey Dresser, MD, 10 mg at 03/21/22 2150   sacubitril-valsartan (ENTRESTO) 24-26 mg per tablet, 1 tablet, Oral, BID, Lyda Jester M, PA-C, 1 tablet at 03/21/22 2150   sodium chloride flush (NS) 0.9 % injection 3 mL, 3 mL, Intravenous, Q12H, Irish Lack, Jayadeep S, MD, 3 mL at 03/21/22 2151   sodium chloride flush (NS) 0.9 % injection 3 mL, 3 mL, Intravenous, PRN, Larae Grooms S, MD, 3 mL at 03/13/22 2125   spironolactone (ALDACTONE) tablet 12.5 mg, 12.5 mg, Oral, Daily, Irish Lack, Jayadeep S, MD, 12.5 mg at 03/21/22 1002   testosterone (ANDROGEL) 50 MG/5GM (1%) gel 5 g, 5 g,  Transdermal, QHS, Nettey, Evalee Jefferson, MD   white petrolatum (VASELINE) gel, , Topical, PRN, Geradine Girt, DO, Given at 03/22/22 4235   Patients Current Diet:  Diet Order                  DIET DYS 2 Room service appropriate? Yes with Assist; Fluid consistency: Thin; Fluid restriction: 1200 mL Fluid  Diet effective now                         Precautions / Restrictions Precautions Precautions: Fall, Other (comment) Precaution Comments: vision impaired, feeding tube Other Brace: RLE AFO and shoes, pt and son think he does worse with AFO on Restrictions Weight Bearing Restrictions: No    Has the patient had 2 or more falls or a fall with injury in the past year? Yes   Prior Activity Level Community (5-7x/wk): Pt. went out for appts PTA   Prior Functional Level Self Care: Did the patient need help bathing, dressing, using the toilet or eating? Independent   Indoor Mobility: Did the patient need assistance with walking from room to room (with or without device)? Independent   Stairs: Did the patient need assistance with internal or external stairs (with or without device)? Needed some help   Functional Cognition: Did the patient need help planning regular tasks such as shopping or remembering to take medications? Needed some help   Patient Information Are you of Hispanic, Latino/a,or Spanish origin?: A. No, not of Hispanic, Latino/a, or Spanish origin What is your race?: A. White Do you need or want an interpreter to communicate with a doctor or health care staff?: 0. No   Patient's Response To:  Health Literacy and Transportation Is the patient able to respond to health literacy and transportation needs?: Yes Health Literacy - How often do you need to have someone help you when you read instructions, pamphlets, or other written material from your doctor or pharmacy?: Never In the past 12 months, has lack of transportation kept you from medical appointments or from getting  medications?: No In the past 12 months, has lack of transportation kept you from  meetings, work, or from getting things needed for daily living?: No   Development worker, international aid / [place] Devices/Equipment: Environmental consultant (specify type), Eyeglasses Home Equipment: Shower seat, Conservation officer, nature (2 wheels), Rollator (4 wheels), BSC/3in1, Wheelchair - manual   Prior Device Use: Indicate devices/aids used by the patient prior to current illness, exacerbation or injury? Walker   Current Functional Level Cognition   Arousal/Alertness: Awake/alert Overall Cognitive Status: Impaired/Different from baseline Current Attention Level: Selective Orientation Level: Oriented X4 Following Commands: Follows one step commands consistently, Follows one step commands with increased time, Follows multi-step commands inconsistently Safety/Judgement: Decreased awareness of safety, Decreased awareness of deficits General Comments: Less verbal today, concentrating on breathing. Attention: Sustained Sustained Attention: Appears intact Memory: Impaired Memory Impairment: Retrieval deficit Awareness: Appears intact Problem Solving: Appears intact (stated he "knows he needs walker for further distances") Safety/Judgment: Other (comment) (appears grossly WFL)    Extremity Assessment (includes Sensation/Coordination)   Upper Extremity Assessment: RUE deficits/detail, LUE deficits/detail RUE Deficits / Details: intentional tremor at baseline. generally weak, overall WFL for tasks assessed LUE Deficits / Details: globally weak, increased time and effort needed for coordination and AROM. intentional tremor at baseline LUE Coordination: decreased fine motor, decreased gross motor  Lower Extremity Assessment: Defer to PT evaluation RLE Deficits / Details: Rt ankle DF 3+/5, quad and hip flexor 4/5 RLE Sensation: WNL     ADLs   Overall ADL's : Needs assistance/impaired Eating/Feeding: Minimal assistance,  Sitting Eating/Feeding Details (indicate cue type and reason): requires min assist to use fork with R hand, limited by tremors Grooming: Sitting, Supervision/safety Upper Body Bathing: Supervision/ safety, Sitting Lower Body Bathing: Minimal assistance, Sit to/from stand Upper Body Dressing : Supervision/safety, Sitting Lower Body Dressing: Minimal assistance, Sitting/lateral leans, Sit to/from stand Lower Body Dressing Details (indicate cue type and reason): able to manage socks, min assist in standing and relies on BUE support Toilet Transfer: Moderate assistance, +2 for physical assistance, +2 for safety/equipment, Ambulation, BSC/3in1, Rolling walker (2 wheels) Toilet Transfer Details (indicate cue type and reason): simulated Functional mobility during ADLs: Minimal assistance, Rolling walker (2 wheels), Cueing for safety General ADL Comments: sit<>stand 3x with mod A +2. tolerated 66f ambulation with RW 3x. limited by activity tolerance and weakness     Mobility   Overal bed mobility: Needs Assistance Bed Mobility: Supine to Sit Supine to sit: Min guard, HOB elevated Sit to supine: Min guard, HOB elevated General bed mobility comments: Min guard for safety. use of rails and increased time.     Transfers   Overall transfer level: Needs assistance Equipment used: Rolling walker (2 wheels) Transfers: Sit to/from Stand Sit to Stand: Mod assist, +2 safety/equipment Bed to/from chair/wheelchair/BSC transfer type:: Step pivot Step pivot transfers: Max assist (Assistance for RW management, cuing for sequencing & to reach back for recliner prior to sitting.) General transfer comment: Mod A to power to standing with cues for hand placement/technique. Stood from [employer] from chair x2, flexed posture at hips/knees and tremors present throughout.     Ambulation / Gait / Stairs / Wheelchair Mobility   Ambulation/Gait Ambulation/Gait assistance: Mod assist Gait Distance (Feet): 8 Feet (+8' +  10') Assistive device: Rolling walker (2 wheels) Gait Pattern/deviations: Narrow base of support, Decreased stride length, Trunk flexed, Knee flexed in stance - right, Knee flexed in stance - left, Shuffle, Step-to pattern, Decreased step length - left General Gait Details: Slow, unsteady gait with decreased step length LLE, flexed posture throughout and assist needed for forward  momentum and RW management. 2 seated rest breaks. 3/4 DOE. Sp02 dropped to 86% on last bout on 3L/min 02 [place]. Cues for pursed lip breathing. Gait velocity: decreased Gait velocity interpretation: <1.31 ft/sec, indicative of household ambulator Pre-gait activities: Pt initially is very wobbly but is able to get balance with physical assistance.     Posture / Balance Dynamic Sitting Balance Sitting balance - Comments: Prefers UE support, supervision for static sitting balance Balance Overall balance assessment: Needs assistance Sitting-balance support: Feet supported, No upper extremity supported Sitting balance-Leahy Scale: Fair Sitting balance - Comments: Prefers UE support, supervision for static sitting balance Postural control: Left lateral lean, Posterior lean Standing balance support: During functional activity, Bilateral upper extremity supported, Reliant on assistive device for balance Standing balance-Leahy Scale: Poor Standing balance comment: relies on BUE and external support     Special needs/care consideration Skin intact and Special service needs coretrak    Previous Home Environment (from acute therapy documentation) Living Arrangements: Spouse/significant other  Lives With: Spouse Available Help at Discharge: Family, Available 24 hours/day Type of Home: House Home Layout: Two level, 1/2 bath on main level Alternate Level Stairs-Rails: Left Alternate Level Stairs-Number of Steps: has chair lift Home Access: Stairs to enter Entrance Stairs-Rails: Right Entrance Stairs-Number of Steps: 4 Bathroom  Shower/Tub: Multimedia programmer: Standard Bathroom Accessibility: Yes Home Care Services: No Additional Comments: Using RW to ambulate at home. Wife cooks meals.   Discharge Living Setting Plans for Discharge Living Setting: Patient's home Type of Home at Discharge: House Discharge Home Layout: Two level, 1/2 bath on main level, Other (Comment) Alternate Level Stairs-Rails: Left Discharge Home Access: Stairs to enter Entrance Stairs-Rails: Right Entrance Stairs-Number of Steps: 4 Discharge Bathroom Shower/Tub: Walk-in shower Discharge Bathroom Toilet: Standard Discharge Bathroom Accessibility: No Does the patient have any problems obtaining your medications?: No   Social/Family/Support Systems Patient Roles: Other (Comment) Contact Information: [phone] Anticipated Caregiver: [name] (son) Anticipated Caregiver's Contact Information: Wife unwilling to provide more than intermittent supervision. Son coming to live with pt. and care for him after CIR Ability/Limitations of Caregiver: Min A Caregiver Availability: 24/7 Discharge Plan Discussed with Primary Caregiver: Yes Is Caregiver In Agreement with Plan?: Yes Does Caregiver/Family have Issues with Lodging/Transportation while Pt is in Rehab?: No   Goals Patient/Family Goal for Rehab: PT/OT Supervision to Min A, SLP supervision to mod I Expected length of stay: 14-16 days Pt/Family Agrees to Admission and willing to participate: Yes Program Orientation Provided & Reviewed with Pt/Caregiver Including Roles  & Responsibilities: Yes   Decrease burden of Care through IP rehab admission: none    Possible need for SNF placement upon discharge: n/a   Patient Condition: I have reviewed medical records from [facility] , spoken with CM, and patient, spouse, and family member. I met with patient at the bedside for inpatient rehabilitation assessment.  Patient will benefit from ongoing PT and OT, can actively  participate in 3 hours of therapy a day 5 days of the week, and can make measurable gains during the admission.  Patient will also benefit from the coordinated team approach during an Inpatient Acute Rehabilitation admission.  The patient will receive intensive therapy as well as Rehabilitation physician, nursing, social worker, and care management interventions.  Due to safety, skin/wound care, disease management, medication administration, pain management, and patient education the patient requires 24 hour a day rehabilitation nursing.  The patient is currently min A  with mobility and basic ADLs.  Discharge setting  and therapy post discharge at home with home health is anticipated.  Patient has agreed to participate in the Acute Inpatient Rehabilitation Program and will admit today.   Preadmission Screen Completed By:  Genella Mech, 03/22/2022 9:38 AM ______________________________________________________________________   Discussed status with Dr. Naaman Plummer  on 03/22/21 at 60 and received approval for admission today.   Admission Coordinator:  Genella Mech, CCC-SLP, time 1148/Date 03/22/21    Assessment/Plan: Diagnosis: right PCA infarct, multiple medical Does the need for close, 24 hr/day Medical supervision in concert with the patient's rehab needs make it unreasonable for this patient to be served in a less intensive setting? Yes Co-Morbidities requiring supervision/potential complications: cad, vt, prostate cancer, acsCHF,  Due to bladder management, bowel management, safety, skin/wound care, disease management, medication administration, pain management, and patient education, does the patient require 24 hr/day rehab nursing? Yes Does the patient require coordinated care of a physician, rehab nurse, PT, OT, and SLP to address physical and functional deficits in the context of the above medical diagnosis(es)? Yes Addressing deficits in the following areas: balance, endurance, locomotion, strength,  transferring, bowel/bladder control, bathing, dressing, feeding, grooming, toileting, cognition, swallowing, and psychosocial support Can the patient actively participate in an intensive therapy program of at least 3 hrs of therapy 5 days a week? Yes The potential for patient to make measurable gains while on inpatient rehab is excellent Anticipated functional outcomes upon discharge from inpatient rehab: supervision and min assist PT, supervision and min assist OT, modified independent SLP Estimated rehab length of stay to reach the above functional goals is: 14-17 days Anticipated discharge destination: Home 10. Overall Rehab/Functional Prognosis: excellent     MD Signature: Meredith Staggers, MD, [place] Director Rehabilitation Services 03/22/2022

## 2022-03-22 NOTE — Progress Notes (Signed)
Speech Language Pathology Treatment: Dysphagia  Patient Details Name: William Barron MRN: [id number] DOB: 1945/09/08 Today's Date: 03/22/2022 Time: 7169-6789 SLP Time Calculation (min) (ACUTE ONLY): 21 min  Assessment / Plan / Recommendation Clinical Impression  Pt seen for dysphagia tx with intake of thin/puree with oral holding secondary to thrush/odynophagia and placement of Cortrak irritating his pharynx during consumption.  Discussed with min verbal cues to use small bites/sips and temperature variations to ease transition of boluses into pharynx d/t thrush.  Pt agreeable and wanting to consume puree or thin (cold) temperatures primarily and have medications given via Cortrak.  Encouraged pt to consume medications in puree if able and choose pt preferred foods on Dysphagia 2/thin liquid diet to increase overall satiety.  ST will s/o in acute setting as pt is transitioning to CIR this date.  Continue D2/thin with general swallowing precautions in place.    HPI HPI: 77 year old male with history of nonobstructive CAD, mild to moderate aortic stenosis, moderate pulmonary regurgitation, pancreatic CA s/p whipple, diabetes, 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 On 12/28 (early morning), pt"Went into ventricular tachycardia versus polymorphic and received 1 minute of chest compressions. He was pulseless. He did not require defibrillation. ROSC was achieved within 1 minute. -Unclear etiology. Lactic acid is normal. Electrolytes are normal. Kidney function is stable. He has been admitted with stroke but is recovering. Also complicated by influenza A. He also has a new onset cardiomyopathy so clearly the combination of influenza and congestive heart failure could have resulted in ventricular arrhythmias"per cardiology note. CXR indicated RLL PNA which could have been a result of aspiration. BSE generated to assess swallow function 12/29.  Reconsulted 12/30, Per MD note: "Bibasal infiltrate noted, query secondary to aspiration. Repeat speech evaluation."MBS completed and determined D2/thin liquid diet safest consistency; ST f/u for diet tolerance      SLP Plan  Discharge SLP treatment due to (comment) (transition to CIR)      Recommendations for follow up therapy are one component of a multi-disciplinary discharge planning process, led by the attending physician.  Recommendations may be updated based on patient status, additional functional criteria and insurance authorization.    Recommendations  Diet recommendations: Dysphagia 2 (fine chop);Thin liquid Liquids provided via: Cup;Straw Medication Administration: Whole meds with puree (or split/placed in Cortrak) Supervision: Staff to assist with self feeding;Intermittent supervision to cue for compensatory strategies Compensations: Slow rate;Small sips/bites;Follow solids with liquid Postural Changes and/or Swallow Maneuvers: Seated upright 90 degrees                Oral Care Recommendations: Oral care BID;Staff/trained caregiver to provide oral care Follow Up Recommendations: Acute inpatient rehab (3hours/day) Assistance recommended at discharge: Frequent or constant Supervision/Assistance SLP Visit Diagnosis: Dysphagia, unspecified (R13.10) Plan: Discharge SLP treatment due to (comment) (transition to CIR)           Pat Samarah Hogle,M.S., CCC-SLP  03/22/2022, 2:28 PM

## 2022-03-22 NOTE — Progress Notes (Signed)
Mobility Specialist Progress Note:   03/22/22 1249  Mobility  Activity Transferred from chair to bed  Level of Assistance Moderate assist, patient does 50-74%  Assistive Device Front wheel walker  Distance Ambulated (ft) 2 ft  Activity Response Tolerated well  $Mobility charge 1 Mobility   Pt received in bed willing to participate in mobility. No complaints of pain. Left in bed with call bell in reach and all needs met.    Gareth Eagle Marlyn Rabine Mobility Specialist Please contact via [employer] or  Rehab Office at [phone]

## 2022-03-22 NOTE — Progress Notes (Addendum)
Patient ID: William Barron, male   DOB: [birth year], 77 y.o.   MRN: [id number]   Advanced Heart Failure Rounding Note  PCP-Cardiologist: Minus Breeding, MD   Subjective:    Tolerating Entresto ok, SBP 90s-low 100s.   Getting TFs through [facility], minimal PO intake with thrush  Remains weak/deconditioned. PT recommending CIR. Awaiting approval   Wife at bedside. Denies CP and dyspnea.   Objective:   Weight Range: 55.7 kg Body mass index is 20.43 kg/m.   Vital Signs:   Temp:  [98.1 F (36.7 C)-98.5 F (36.9 C)] 98.5 F (36.9 C) (01/08 0300) Pulse Rate:  [76-83] 76 (01/08 0742) Resp:  [16-20] 20 (01/08 0742) BP: (95-106)/(51-59) 95/51 (01/08 0742) SpO2:  [91 %-100 %] 100 % (01/08 0300) Last BM Date : 03/21/22  Weight change: Filed Weights   03/19/22 0500 03/20/22 0500 03/21/22 0531  Weight: 57.5 kg 57.4 kg 55.7 kg    Intake/Output:   Intake/Output Summary (Last 24 hours) at 03/22/2022 0851 Last data filed at 03/22/2022 0543 Gross per 24 hour  Intake --  Output 1900 ml  Net -1900 ml      Physical Exam    General:  chronically ill / weak / elderly appearing.  No respiratory difficulty HEENT: cortrack, glasses, hearing aids Neck: supple. JVD ~8 cm. Carotids 2+ bilat; no bruits. No lymphadenopathy or thyromegaly appreciated. Cor: PMI nondisplaced. Regular rate & rhythm. No rubs, gallops or murmurs. Lungs: clear Abdomen: soft, nontender, nondistended. No hepatosplenomegaly. No bruits or masses. Good bowel sounds. Extremities: no cyanosis, clubbing, rash, edema  Neuro: alert & oriented x 3, cranial nerves grossly intact. moves all 4 extremities.. Affect pleasant. Very weak.  Telemetry   NSR 80s (Personally reviewed)    Labs    CBC No results for input(s): "WBC", "NEUTROABS", "HGB", "HCT", "MCV", "PLT" in the last 72 hours.  Basic Metabolic Panel Recent Labs    03/20/22 0125  MG 2.2  PHOS 3.2   Liver Function Tests No results for input(s): "AST",  "ALT", "ALKPHOS", "BILITOT", "PROT", "ALBUMIN" in the last 72 hours.  No results for input(s): "LIPASE", "AMYLASE" in the last 72 hours. Cardiac Enzymes No results for input(s): "CKTOTAL", "CKMB", "CKMBINDEX", "TROPONINI" in the last 72 hours.  BNP: BNP (last 3 results) Recent Labs    03/07/22 1237  BNP 1,326.2*    ProBNP (last 3 results) No results for input(s): "PROBNP" in the last 8760 hours.   D-Dimer No results for input(s): "DDIMER" in the last 72 hours. Hemoglobin A1C No results for input(s): "HGBA1C" in the last 72 hours. Fasting Lipid Panel No results for input(s): "CHOL", "HDL", "LDLCALC", "TRIG", "CHOLHDL", "LDLDIRECT" in the last 72 hours. Thyroid Function Tests No results for input(s): "TSH", "T4TOTAL", "T3FREE", "THYROIDAB" in the last 72 hours.  Invalid input(s): "FREET3"  Other results:   Imaging    No results found.   Medications:     Scheduled Medications:  acidophilus  1 capsule Oral Daily   amiodarone  400 mg Oral BID   Followed by   Derrill Memo ON 03/26/2022] amiodarone  400 mg Oral Daily   aspirin EC  325 mg Oral Daily   clopidogrel  75 mg Oral Daily   dapagliflozin propanediol  10 mg Oral Daily   diclofenac Sodium  2 g Topical QID   feeding supplement (PROSource TF20)  60 mL Per Tube Daily   heparin  5,000 Units Subcutaneous Q8H   insulin aspart  0-9 Units Subcutaneous TID WC   insulin aspart  5 Units Subcutaneous TID WC   insulin glargine-yfgn  15 Units Subcutaneous Daily   lidocaine  1 patch Transdermal Q24H   lipase/protease/amylase  72,000 Units Oral Q6H   megestrol  400 mg Oral Daily   mirabegron ER  25 mg Oral Daily   nystatin  5 mL Oral QID   pantoprazole  40 mg Oral BID   rosuvastatin  10 mg Oral Daily   sacubitril-valsartan  1 tablet Oral BID   sodium chloride flush  3 mL Intravenous Q12H   spironolactone  12.5 mg Oral Daily   testosterone  5 g Transdermal QHS    Infusions:  sodium chloride     feeding supplement (JEVITY  1.5 CAL/FIBER) 1,000 mL (03/22/22 0543)    PRN Medications: sodium chloride, acetaminophen **OR** [DISCONTINUED] acetaminophen (TYLENOL) oral liquid 160 mg/5 mL **OR** acetaminophen, ipratropium-albuterol, sodium chloride flush, white petrolatum    Assessment/Plan   1. Acute on chronic systolic CHF: Echo (7/12) with EF 55-60%.  Echo 12/23 with EF 30-35%, normal RV, normal IVC.  LHC/RHC 12/23 with moderate CAD (60% mLAD, 70% dLAD, 70% D2, 60% dRCA) and low CI 1.87 with normal right and left heart filling pressures.  CAD does not explain fall in EF.  Cardiac MRI showed LV EF 33% with diffuse hypokinesis, RV EF 46% predominantly mid-wall LGE with some subendocardial component in the basal inferolateral wall and focally in the mid lateral wall.  Pattern is not clearly due to prior MI but cannot rule this out given moderate CAD on cath. Alternatively could be due to prior myocarditis. T2 not elevated in the basal inferolateral wall, so do not suspect active inflammation. Findings NOT consistent with cardiac AL amyloidosis. On exam and by recent [place], he is not volume overloaded.   - Continue Entresto 24-26 mg bid  - Does not need a diuretic currently, monitor wt/I/os closely w/ TFs  - Continue dapagliflozin and spironolactone.  - checking BMET/CBC this morning - Would hold off on inotropic support for the time being, think main problem at this time is PNA/lung infiltrates and failure to thrive/malnutrition.  2. CVA: Right occipital CVA with distal right PCA severe stenosis vs occlusion. Presented with visual loss, now improved.  Neurology has signed off.  - ASA 325 + Plavix 75 x 3 months then ASA 81 daily.  - Crestor 10 daily 3. VT arrest: 1 minute CPR with ROSC.  He is on amiodarone.  Suspect triggered by lateral wall scar (prior MI versus myocarditis).  EP has seen, recommended amiodarone and Lifevest at discharge to follow post-discharge course and decide on ICD down the road depending on his  trajectory. Not likely a good ICD candidate given overall prognosis. - Continue amiodarone po.  4. CAD: LHC this admission with moderate CAD (60% mLAD, 70% dLAD, 70% D2, 60% dRCA).  Out of proportion to cardiomyopathy.  As noted above, cMRI suggested most likely prior myocarditis but cannot fully rule out prior MI involving basal lateral wall. No chest pain.  - Continue ASA as above.  - Crestor 10 mg daily.  5. Multiple myeloma with AL amyloidosis isolated to lymph nodes: Followed at [facility] since 2014. Treated with chemo in past. Has known bulky mediastinal/hilar lymphadenopathy. Has been off chemo/steroids for a year d/t frailty 6. Prostate cancer: Treated with radiation in 2022.  7. Right eye diplopia: Chronic, 6th nerve palsy.  8. H/o Whipple procedure for IPMN in 5/23.  9. Influenza A: Has been treated with 5 days oseltamivir this admission.  9. Pulmonary:  CT chest this admission showed extensive bilateral multilobar PNA, R>L and bulky mediastinal/hilar LAN, seen on prior studies. He has a cough, no fever or leukocytosis and PCT not significantly elevated.  He had a VT arrest with 1 minute CPR, strong suspicion for aspiration pneumonitis.  Pulmonary has seen, no utility to bronchoscopy.  Breathing improved. Stable on RA  - Continue Augmentin for aspiration coverage.  - Had barium swallow. Concern for possible proximal esophageal stricture. Dysphagia 2 diet - SLP following - Incentive spirometry.  10. Frailty/failure to thrive: Did not recover well at all post-Whipple in 5/23 per family.  Has had poor appetite.  Having trouble swallowing.  Albumin low, malnourished.  - Mobilize, PT. CIR recommended  - TFs through Cortrack  11. Thrush: With odynophagia.  - Continue Nystatin.   GOC Think he is fairly stable right now from cardiac standpoint, suspect main issue is aspiration pneumonitis and frailty/malnutrition.   Concern for poor long-term prognosis with failure to thrive, VT arrest, PNA.  Palliative care following. CODE status changed to DNR. Plan to continue full scope interventions.  PT recommending CIR. Awaiting approval/bed availability.   Length of Stay: [place], NP  03/22/2022, 8:51 AM  Advanced Heart Failure Team Pager [phone] (M-F; 7a - 5p)  Please contact [facility] for night-coverage after hours (5p -7a ) and weekends on [email or web address]  Patient seen with NP, agree with the above note.   Still weak.  Has Cortrack in place.  Stable BP on current cardiac meds.  No BMET today.   General: NAD, weak-appearing Neck: No JVD, no thyromegaly or thyroid nodule.  Lungs: Clear to auscultation bilaterally with normal respiratory effort. CV: Nondisplaced PMI.  Heart regular S1/S2, no S3/S4, no murmur.  No peripheral edema.   Abdomen: Soft, nontender, no hepatosplenomegaly, no distention.  Skin: Intact without lesions or rashes.  Neurologic: Alert and oriented x 3.  Psych: Normal affect. Extremities: No clubbing or cyanosis.  HEENT: Normal.   Would continue current cardiac meds.    Mobilizing with PT.  Still hard for him to eat with thrush but getting TFs via Cortrack.  Plan for CIR.   He is DNR and very frail, would not place Lifevest.  He is slowly improving, will need to see how he does down the road.  Decrease amiodarone to 400 mg daily on Friday per EP recs.    Loralie Champagne 03/22/2022 10:50 AM

## 2022-03-23 DIAGNOSIS — B370 Candidal stomatitis: Secondary | ICD-10-CM | POA: Diagnosis not present

## 2022-03-23 DIAGNOSIS — I639 Cerebral infarction, unspecified: Secondary | ICD-10-CM

## 2022-03-23 DIAGNOSIS — I5023 Acute on chronic systolic (congestive) heart failure: Secondary | ICD-10-CM | POA: Diagnosis not present

## 2022-03-23 DIAGNOSIS — E119 Type 2 diabetes mellitus without complications: Secondary | ICD-10-CM | POA: Diagnosis not present

## 2022-03-23 LAB — CBC WITH DIFFERENTIAL/PLATELET
Abs Immature Granulocytes: 0.07 10*3/uL (ref 0.00–0.07)
Basophils Absolute: 0.1 10*3/uL (ref 0.0–0.1)
Basophils Relative: 1 %
Eosinophils Absolute: 0.1 10*3/uL (ref 0.0–0.5)
Eosinophils Relative: 1 %
HCT: 32.5 % — ABNORMAL LOW (ref 39.0–52.0)
Hemoglobin: 9.9 g/dL — ABNORMAL LOW (ref 13.0–17.0)
Immature Granulocytes: 1 %
Lymphocytes Relative: 9 %
Lymphs Abs: 0.7 10*3/uL (ref 0.7–4.0)
MCH: 23.3 pg — ABNORMAL LOW (ref 26.0–34.0)
MCHC: 30.5 g/dL (ref 30.0–36.0)
MCV: 76.5 fL — ABNORMAL LOW (ref 80.0–100.0)
Monocytes Absolute: 0.3 10*3/uL (ref 0.1–1.0)
Monocytes Relative: 4 %
Neutro Abs: 6.4 10*3/uL (ref 1.7–7.7)
Neutrophils Relative %: 84 %
Platelets: 566 10*3/uL — ABNORMAL HIGH (ref 150–400)
RBC: 4.25 MIL/uL (ref 4.22–5.81)
RDW: 18.4 % — ABNORMAL HIGH (ref 11.5–15.5)
WBC: 7.6 10*3/uL (ref 4.0–10.5)
nRBC: 0 % (ref 0.0–0.2)

## 2022-03-23 LAB — COMPREHENSIVE METABOLIC PANEL
ALT: 121 U/L — ABNORMAL HIGH (ref 0–44)
AST: 99 U/L — ABNORMAL HIGH (ref 15–41)
Albumin: 2.4 g/dL — ABNORMAL LOW (ref 3.5–5.0)
Alkaline Phosphatase: 677 U/L — ABNORMAL HIGH (ref 38–126)
Anion gap: 9 (ref 5–15)
BUN: 34 mg/dL — ABNORMAL HIGH (ref 8–23)
CO2: 21 mmol/L — ABNORMAL LOW (ref 22–32)
Calcium: 8.5 mg/dL — ABNORMAL LOW (ref 8.9–10.3)
Chloride: 110 mmol/L (ref 98–111)
Creatinine, Ser: 1.3 mg/dL — ABNORMAL HIGH (ref 0.61–1.24)
GFR, Estimated: 56 mL/min — ABNORMAL LOW (ref >60)
Glucose, Bld: 191 mg/dL — ABNORMAL HIGH (ref 70–99)
Potassium: 4.4 mmol/L (ref 3.5–5.1)
Sodium: 140 mmol/L (ref 135–145)
Total Bilirubin: 0.3 mg/dL (ref 0.3–1.2)
Total Protein: 6.1 g/dL — ABNORMAL LOW (ref 6.5–8.1)

## 2022-03-23 LAB — GLUCOSE, CAPILLARY
Glucose-Capillary: 171 mg/dL — ABNORMAL HIGH (ref 70–99)
Glucose-Capillary: 181 mg/dL — ABNORMAL HIGH (ref 70–99)
Glucose-Capillary: 190 mg/dL — ABNORMAL HIGH (ref 70–99)
Glucose-Capillary: 224 mg/dL — ABNORMAL HIGH (ref 70–99)

## 2022-03-23 MED ORDER — JEVITY 1.5 CAL/FIBER PO LIQD
1000.0000 mL | ORAL | Status: DC
  Administered 2022-03-23 – 2022-03-24 (×2): 1000 mL
  Filled 2022-03-23 (×4): qty 1000

## 2022-03-23 MED ORDER — ASPIRIN 325 MG PO TABS
325.0000 mg | ORAL_TABLET | Freq: Every day | ORAL | Status: DC
  Administered 2022-03-24 – 2022-03-27 (×4): 325 mg
  Filled 2022-03-23 (×4): qty 1

## 2022-03-23 MED ORDER — MEGESTROL ACETATE 400 MG/10ML PO SUSP
400.0000 mg | Freq: Two times a day (BID) | ORAL | Status: DC
  Administered 2022-03-23 – 2022-03-28 (×11): 400 mg
  Filled 2022-03-23 (×14): qty 10

## 2022-03-23 MED ORDER — FAMOTIDINE 40 MG/5ML PO SUSR
20.0000 mg | Freq: Every day | ORAL | Status: DC
  Administered 2022-03-24 – 2022-03-28 (×5): 20 mg
  Filled 2022-03-23 (×9): qty 2.5

## 2022-03-23 NOTE — Progress Notes (Signed)
Resting the majority portion of shift denies pain no apparently distress or discomfort.Continue Jevity TF via Cortrax  per orders and monitoring, tolerating well, repositioned and turned,Patient states he has had a long day and just want to rest assisted with repositioning for comfort.Remain on ,on D2,diet tolerated small amount of po's., 1200 cc FR and educated on dietary monitoring.Continue to assist and assess patient's needs

## 2022-03-23 NOTE — Progress Notes (Signed)
Occupational Therapy Session Note  Patient Details  Name: William Barron MRN: [id number] Date of Birth: Sep 10, 1945  Today's Date: 03/23/2022 OT Individual Time: 1400-1430 OT Individual Time Calculation (min): 30 min    Short Term Goals: Week 1:  OT Short Term Goal 1 (Week 1): Pt iwll transfer to toilet wiht LRAD and MIN A OT Short Term Goal 2 (Week 1): Pt will locate all needed ADL items with no cuing to demo improved vision/compensatory strategies OT Short Term Goal 3 (Week 1): Pt will complete dressing sit to stand wiht MIN A only for balance  Skilled Therapeutic Interventions/Progress Updates:  1:1. Pt received in bed agreeable to OT with wife presnt. Education on dys 2 diet and how hot dog she brought was not recommended on pt diet and need to clarify with SLP what recommended food are for pt. Pt wife agreeable. Pt completes stand pivot transfers with MIN A with mild tremor as moving upright and during vision screen holding eye occlusive. Vision screen completed for occulomotor movement. Pt with R cranial nerve 6 palsy impacting movement/tracking of eye laterally to R. Pt also demo decreased acuity in that eye, unable to read letter on stimulus with R eye but able to with L eye. Pt L eye with increased eye movements when tracking especially laterally. Pt with single vision at midline but moving >10* past midline reporting diploia in all directions. ? Decreased visual fields V slow processing/reporting with L eye. Will need more time to assess. Exited session with pt seated in bed, exit alarm on and call light in reach  Therapy Documentation Precautions:  Precautions Precautions: Fall, Other (comment) Precaution Comments: vision impaired, feeding tube Required Braces or Orthoses: Other Brace Other Brace: RLE AFO and shoes, pt and son think he does worse with AFO on (per chart) Restrictions Weight Bearing Restrictions: No  Therapy/Group: Individual Therapy  [name] 03/23/2022, 2:47 PM

## 2022-03-23 NOTE — Progress Notes (Signed)
Initial Nutrition Assessment  DOCUMENTATION CODES:   Not applicable  INTERVENTION:  - Jevity 1.5 at 89m/hour  (over 20 hrs) (1200 mL goal daily volume) -Provide PROSource TF20 60 mL once daily per tube -Goal regimen provides: 1880 kcal, 97 grams of protein, 912 mL H2O daily  - Add Magic cup TID with meals, each supplement provides 290 kcal and 9 grams of protein  - Add CNIKEEssentials TID, each packet mixed with 8 ounces of 2% milk provides 13 grams of protein and 260 calories.  NUTRITION DIAGNOSIS:   Inadequate oral intake related to poor appetite, dysphagia as evidenced by meal completion < 25%.  GOAL:   Patient will meet greater than or equal to 90% of their needs  MONITOR:   PO intake, Supplement acceptance, TF tolerance  REASON FOR ASSESSMENT:   New TF    ASSESSMENT:   77y.o. male admits to CIR related to functional deficits secondary to right PCA territory stroke. PMH includes: amyloidosis, multiple myeloma, prostate cancer, pancreatic cancer, s/p whipple, dyslipidemia, IDDM.  Meds reviewed: risaquad, pepcid, sliding scale insulin, semglee (15 units), creon, megace, aldactone. Labs reviewed.   RD attempted to see pt 2x. Pt was working wit another provider in room and then out of room at second attempt. Pt is currently on a Dys 2 diet. Pt transferred to CIR from inpatient floor receiving TF at previously ordered goal rate. Now that patient has transferred RD will recalculate TF to account for 4 hrs of therapy. RN reports that the pt has been tolerating TF well with no issues via Cortrak. RN reports that the pt continues with minimal PO intakes.   RD will add supplements that were previously added during pt's inpatient stay. RD will follow up with pt in-person to see how he has been tolerating supplements.   NUTRITION - FOCUSED PHYSICAL EXAM:  Attempt at f/u.  Diet Order:   Diet Order             DIET DYS 2 Room service appropriate? Yes with  Assist; Fluid consistency: Thin; Fluid restriction: 1200 mL Fluid  Diet effective now                   EDUCATION NEEDS:   Not appropriate for education at this time  Skin:  Skin Assessment: Reviewed RN Assessment  Last BM:  1/9  Height:   Ht Readings from Last 1 Encounters:  03/22/22 '5\' 5"'$  (1.651 m)    Weight:   Wt Readings from Last 1 Encounters:  03/23/22 54.2 kg    Ideal Body Weight:     BMI:  Body mass index is 19.88 kg/m.  Estimated Nutritional Needs:   Kcal:  15462-7035kcals  Protein:  95-110 gm  Fluid:  >/= 1.8 L  AThalia Bloodgood RD, LDN, CNSC.

## 2022-03-23 NOTE — Evaluation (Signed)
Physical Therapy Assessment and Plan  Patient Details  Name: William Barron MRN: [id number] Date of Birth: [birth year]  PT Diagnosis: Abnormal posture, Abnormality of gait, Cognitive deficits, Difficulty walking, Dizziness and giddiness, Edema, Hemiplegia dominant, Muscle weakness, and Pain in sacrum Rehab Potential: Good ELOS: 2 weeks   Today's Date: 03/23/2022 PT Individual Time: 1100-1200 PT Individual Time Calculation (min): 60 min    Hospital Problem: Principal Problem:   Acute CVA (cerebrovascular accident) [facility])   Past Medical History:  Past Medical History:  Diagnosis Date   Amyloidosis ([place])    Diabetes mellitus without complication ([place])    Feeding tube dysfunction, initial encounter 08/12/2021   GERD (gastroesophageal reflux disease)    Hearing loss    Has hearing aids   History of kidney stones    Hyperlipidemia    Jejunostomy tube present ([place])    Multiple myeloma ([place])    and amylodosis    Nephrolithiasis    Occasional tremors    Pancreatic insufficiency    Pneumonia    Prostate cancer ([place])    Prostatitis    acute and chronic   Skin cancer    Past Surgical History:  Past Surgical History:  Procedure Laterality Date   APPENDECTOMY     CARDIAC CATHETERIZATION N/A 06/13/2015   Procedure: Left Heart Cath and Coronary Angiography;  Surgeon: Jettie Booze, MD;  Location: [place] [facility];  Service: Cardiovascular;  Laterality: N/A;   COLONOSCOPY  04/07/2000   ELBOW SURGERY  03/15/2001   right   ESOPHAGOGASTRODUODENOSCOPY (EGD) WITH PROPOFOL N/A 09/07/2021   Procedure: ESOPHAGOGASTRODUODENOSCOPY (EGD) WITH PROPOFOL;  Surgeon: Irene Shipper, MD;  Location: [place];  Service: Gastroenterology;  Laterality: N/A;   EXTRACORPOREAL SHOCK WAVE LITHOTRIPSY Left 07/17/2018   Procedure: EXTRACORPOREAL SHOCK WAVE LITHOTRIPSY (ESWL);  Surgeon: [name], MD;  Location: [facility];  Service: Urology;  Laterality: Left;   EXTRACORPOREAL SHOCK WAVE LITHOTRIPSY  Right 12/08/2020   Procedure: RIGHT EXTRACORPOREAL SHOCK WAVE LITHOTRIPSY (ESWL);  Surgeon: Raynelle Bring, MD;  Location[facility];  Service: Urology;  Laterality: Right;   FOOT ARTHRODESIS Right 04/15/2021   Procedure: RIGHT SUBTALAR AND TALONAVICULAR FUSION;  Surgeon: Newt Minion, MD;  Location: [place];  Service: Orthopedics;  Laterality: Right;   HERNIA REPAIR     IR MECH REMOV OBSTRUC MAT ANY COLON TUBE W/FLUORO  08/27/2021   IR REPLC GASTRO/COLONIC TUBE PERCUT W/FLUORO  09/08/2021   RIGHT/LEFT HEART CATH AND CORONARY ANGIOGRAPHY N/A 03/11/2022   Procedure: RIGHT/LEFT HEART CATH AND CORONARY ANGIOGRAPHY;  Surgeon: Jettie Booze, MD;  Location: [place] [facility];  Service: Cardiovascular;  Laterality: N/A;   ROTATOR CUFF REPAIR     WHIPPLE PROCEDURE  07/13/2021    Assessment & Plan Clinical Impression: Patient is a 77 y.o. year old male who presented to [facility] emergency department on 03/05/2022 stating he awoke that morning with headache and vision disturbances.  On exam, the patient had a cranial nerve VII palsy and dilated pupil on the right and possible left-sided visual field deficit.  CTA of the head and neck performed.  Discussed with Dr. Leonel Ramsay and CT scan of the head in the emergency department demonstrated an acute/subacute right occipital stroke.  Out of window for tenecteplase.  Concern for cardioembolic source.     He was admitted to the hospitalist service.  He reports chronic right lower extremity weakness and foot drop.  Echocardiogram revealed EF of 30 to 35%.  This was  significantly reduced as compared to previous echocardiogram in September 2023 with an EF of approximately 55 to 60%.  Cardiology was consulted on 12/24.  No acute ischemic changes on EKG.  Carvedilol 3.125 mg started twice daily.  Other goal-directed medical therapy adjusted over the next several days.  He was started on losartan 25 mg daily on 12/25.  He was  started on aspirin 325 mg daily along with Plavix 75 mg for 3 months with recommendations to continue aspirin alone or after.  Symptoms improved although he still complained of generalized weakness but no focal neurologic deficits. Lower extremity venous Doppler studies were negative for DVT.    On 12/26 he reported a cough and was diagnosed with influenza A.  Treated with Tamiflu.  Failed swallow eval and core track was placed 1/3.  Began tube feeds with protein supplements and continued on Creon for pancreatic insufficiency.  Started on Farxiga 10 mg daily by heart failure team.  Did not require diuretics.  Underwent CT scanning of the chest which revealed bilateral bronchopneumonia. CODE BLUE was called after the patient was found unresponsive and pulseless on around 0445 hrs. on 12/28.  ACLS protocol was underway with ROSC in approximately 2 minutes.  Monitor with ventricular tachycardia.  Placed on amiodarone drip and moved to the ICU with CCM consultation.  Underwent right and left heart cath.  Mild to moderate scattered atherosclerosis.  The left ventricular systolic function is normal.  No hemodynamically significant lesions.  Continue aggressive medical therapy. Started on spirinolactone.  Cardiac MRI performed and consistent with myocarditis and no amyloid involvement.  Electrophysiology consultation obtained.  Noted to have oral thrush on 12/30 and started on nystatin.  Entresto started  1/04 after improvement in serum creatinine. Losartan discontinued.  Barium swallow completed which showed concern for possible proximal esophageal stricture.  Continue dysphagia 2 diet, thin liquids, medications crushed pured.   Palliative care consultation obtained on 1/1.  Palliative care nurse practitioner, [name], discussed and reviewed CODE STATUS on 1/3.  "In the event of cardiac arrest (no pulse), family agrees to DNR status with no CPR, defibrillation, ACLS medications or intubation."   The patient  requires inpatient physical medicine and rehabilitation evaluations and treatment secondary to dysfunction due to right PCA territory stroke, VT arrest, acute heart failure, debility and failure to thrive. Patient transferred to CIR on 03/22/2022 .   Patient currently requires mod with mobility secondary to muscle weakness and muscle joint tightness, decreased cardiorespiratoy endurance and decreased oxygen support, decreased visual acuity and decreased visual motor skills, decreased awareness, decreased problem solving, decreased safety awareness, and decreased memory, and decreased sitting balance, decreased standing balance, decreased postural control, hemiplegia, and decreased balance strategies.  Prior to hospitalization, patient was min-mod I with mobility and lived with Spouse in a House home.  Home access is 4Stairs to enter.  Patient will benefit from skilled PT intervention to maximize safe functional mobility, minimize fall risk, and decrease caregiver burden for planned discharge home with 24 hour assist.  Anticipate patient will benefit from follow up [facility] at discharge.  PT - End of Session Activity Tolerance: Tolerates 30+ min activity with multiple rests Endurance Deficit: Yes Endurance Deficit Description: requires rest break with minimal mobility PT Assessment Rehab Potential (ACUTE/IP ONLY): Good PT Barriers to Discharge: Home environment access/layout;Behavior;Nutrition means;New oxygen PT Patient demonstrates impairments in the following area(s): Balance;Perception;Safety;Behavior;Sensory;Edema;Endurance;Skin Integrity;Motor;Nutrition;Pain PT Transfers Functional Problem(s): Bed Mobility;Bed to Chair;Car;Furniture PT Locomotion Functional Problem(s): Ambulation;Wheelchair Mobility;Stairs PT Plan PT Intensity: Minimum  of 1-2 x/day ,45 to 90 minutes PT Frequency: 5 out of 7 days PT Duration Estimated Length of Stay: 2 weeks PT Treatment/Interventions: Ambulation/gait  training;Community reintegration;Neuromuscular re-education;DME/adaptive equipment instruction;Psychosocial support;Stair training;UE/LE Strength taining/ROM;Wheelchair propulsion/positioning;UE/LE Coordination activities;Therapeutic Activities;Skin care/wound management;Pain management;Functional electrical stimulation;Discharge planning;Balance/vestibular training;Cognitive remediation/compensation;Functional mobility training;Disease management/prevention;Patient/family education;Splinting/orthotics;Therapeutic Exercise;Visual/perceptual remediation/compensation PT Transfers Anticipated Outcome(s): Supervision using LRAD PT Locomotion Anticipated Outcome(s): CGA 50 feet using LRAD PT Recommendation Follow Up Recommendations: Home health PT Patient destination: Home Equipment Recommended: Rolling walker with 5" wheels;Wheelchair cushion (measurements);Wheelchair (measurements) Equipment Details: 16"x16" wheelchair   PT Evaluation Precautions/Restrictions Precautions Precautions: Fall;Other (comment) Precaution Comments: vision impaired, feeding tube Required Braces or Orthoses: Other Brace Other Brace: RLE AFO and shoes, pt and son think he does worse with AFO on (per chart) Restrictions Weight Bearing Restrictions: No General   Vital Signs Pain Pain Assessment Pain Score: 0-No pain Pain Type: Acute pain Pain Location: Back Pain Descriptors / Indicators: Aching Pain Intervention(s): Medication (See eMAR) Pain Interference Pain Interference Pain Effect on Sleep: 4. Almost constantly Pain Interference with Therapy Activities: 0. Does not apply - I have not received rehabilitationtherapy in the past 5 days Pain Interference with Day-to-Day Activities: 1. Rarely or not at all Home Living/Prior [place] Available Help at Discharge: Family;Available 24 hours/day Type of Home: House Home Access: Stairs to enter [employer] of Steps: 4 Entrance Stairs-Rails:  Right Home Layout: Two level;1/2 bath on main level Alternate Level Stairs-Number of Steps: has chair lift Alternate Level Stairs-Rails: Left Additional Comments: Using RW to ambulate at home. Wife cooks meals. (per chart)  Lives With: Spouse Prior Function Level of Independence: Independent with gait;Independent with transfers;Requires assistive device for independence (per pt, need to conmfirm with family) Vocation: Retired Vision/Perception  Vision - History Ability to See in Adequate Light: 0 Adequate Vision - Assessment Eye Alignment: Impaired (comment) Tracking/Visual Pursuits: Right eye does not track laterally Perception Perception: Impaired Praxis Praxis: Intact  Cognition Overall Cognitive Status: Impaired/Different from baseline Arousal/Alertness: Awake/alert Orientation Level: Oriented to place;Oriented to person;Oriented to situation;Disoriented to time Attention: Sustained Sustained Attention: Impaired Sustained Attention Impairment: Verbal basic;Functional basic Memory: Impaired Memory Impairment: Retrieval deficit Awareness: Impaired Awareness Impairment: Emergent impairment Problem Solving: Impaired Problem Solving Impairment: Verbal basic;Functional basic Safety/Judgment: Impaired Sensation Sensation Light Touch: Appears Intact Proprioception: Appears Intact Coordination Gross Motor Movements are Fluid and Coordinated: No Fine Motor Movements are Fluid and Coordinated: No Coordination and Movement Description: slow and deliberate Motor  Motor Motor: Hemiplegia Motor - Skilled Clinical Observations: chronic R hemi   Trunk/Postural Assessment  Cervical Assessment Cervical Assessment:  (forward head) Thoracic Assessment Thoracic Assessment:  (rounded shoudlers) Lumbar Assessment Lumbar Assessment:  (post pelvic tilt) Postural Control Postural Control: Deficits on evaluation (delayed/insufficient)  Balance Balance Balance Assessed:  Yes Standardized Balance Assessment Standardized Balance Assessment: Berg Balance Test Static Sitting Balance Static Sitting - Balance Support: No upper extremity supported Static Sitting - Level of Assistance: 5: Stand by assistance Dynamic Sitting Balance Dynamic Sitting - Level of Assistance: 5: Stand by assistance;4: Min assist Static Standing Balance Static Standing - Balance Support: Bilateral upper extremity supported Static Standing - Level of Assistance: 4: Min assist Dynamic Standing Balance Dynamic Standing - Balance Support: Right upper extremity supported;Left upper extremity supported Dynamic Standing - Level of Assistance: 3: Mod assist;4: Min assist Extremity Assessment  RUE Assessment General Strength Comments: generalized weakness LUE Assessment General Strength Comments: generalized weakness RLE Assessment RLE Assessment: Exceptions to [facility] Active Range of Motion (AROM) Comments: tight  hamstring/heel cords General Strength Comments: Grossly at last 3+/5 with function mobility LLE Assessment LLE Assessment: Exceptions to [facility] Active Range of Motion (AROM) Comments: tight hamstring/heel cords General Strength Comments: Grossly at last 3+/5 with function mobility  Care Tool Care Tool Bed Mobility Roll left and right activity   Roll left and right assist level: Supervision/Verbal cueing    Sit to lying activity   Sit to lying assist level: Supervision/Verbal cueing    Lying to sitting on side of bed activity   Lying to sitting on side of bed assist level: the ability to move from lying on the back to sitting on the side of the bed with no back support.: Supervision/Verbal cueing     Care Tool Transfers Sit to stand transfer   Sit to stand assist level: Minimal Assistance - Patient > 75% Sit to stand assistive device: Walker  Chair/bed transfer   Chair/bed transfer assist level: Minimal Assistance - Patient > 75% Chair/bed transfer assistive device: Research officer, political party transfer   Assist Level: Moderate Assistance - Patient 50 - 74%    Car transfer   Car transfer assist level: Maximal Assistance - Patient 25 - 49%      Care Tool Locomotion Ambulation   Assist level: Moderate Assistance - Patient 50 - 74% Assistive device: Walker-rolling Max distance: 12 ft  Walk 10 feet activity   Assist level: Moderate Assistance - Patient - 50 - 74% Assistive device: Walker-rolling   Walk 50 feet with 2 turns activity Walk 50 feet with 2 turns activity did not occur: Safety/medical concerns      Walk 150 feet activity Walk 150 feet activity did not occur: Safety/medical concerns      Walk 10 feet on uneven surfaces activity Walk 10 feet on uneven surfaces activity did not occur: Safety/medical concerns      Stairs Stair activity did not occur: Safety/medical concerns        Walk up/down 1 step activity Walk up/down 1 step or curb (drop down) activity did not occur: Safety/medical concerns      Walk up/down 4 steps activity Walk up/down 4 steps activity did not occur: Safety/medical concerns      Walk up/down 12 steps activity Walk up/down 12 steps activity did not occur: Safety/medical concerns      Pick up small objects from floor   Pick up small object from the floor assist level: Dependent - Patient 0%    Wheelchair Is the patient using a wheelchair?: Yes Type of Wheelchair: Manual   Wheelchair assist level: Supervision/Verbal cueing Max wheelchair distance: 150 ft  Wheel 50 feet with 2 turns activity   Assist Level: Supervision/Verbal cueing  Wheel 150 feet activity   Assist Level: Supervision/Verbal cueing    Refer to Care Plan for Long Term Goals  SHORT TERM GOAL WEEK 1 PT Short Term Goal 1 (Week 1): Patient will perform basic transfers with CGA >50% of the time. PT Short Term Goal 2 (Week 1): Patient will ambulate >25 ft consistently using LRAD. PT Short Term Goal 3 (Week 1): Patient will initiate stair  training.  Recommendations for other services: None   Skilled Therapeutic Intervention Evaluation completed (see details above and below) with education on PT POC and goals and individual treatment initiated with focus on functional mobility/transfers, LE strength, dynamic standing balance/coordination, ambulation, stair navigation, simulated car transfers, and improved endurance with activity Patient provided with 16"x16" wheelchair with Roho cushion and adjustments made to promote optimal seating  posture and pressure distribution. Patient also provided with RW for use in room and therapist adjusted to proper height for patient.  Patient in bed on 2 L/min O2 upon PT arrival. Patient alert and agreeable to PT session. Patient denied pain during session.  Patient saturations >95% on 2 L/min, remained >95% on RA at rest, >97% with activity on RA. RN made aware.   Orthostatic Vitals: Supine: BP 95/56, HR 80, SPO2 95%  Sitting: BP 106/52, HR 84, SPO2 97% Standing: BP 103/51, HR 91, SPO2 100% B thigh high TEDs donned prior to and throughout session.   Therapeutic Activity: Bed Mobility: Patient performed supine rolling R/L and to/from sit with supervision in a flat bed without use of bed rails.  Transfers: Patient performed sit to/from stand x2 and stand pivot x1 with min A using RW. Provided verbal cues for scooting forward, hand placement on RW, reaching back to sit, and hip extension in standing to promote boosting up and erect posture. Patient performed a simulated sedan height car transfer with mod-max A due to unsafe technique using car frame and car door. Provided cues for safe technique and educated on benefits of using the walker and turning to sit rather than hold on and step in.  Gait Training:  Patient ambulated 12 feet using RW with min fading to mod A due to fatigue with R AFO donned. Ambulated with crouched posture, shuffling gait, and downward head gait.   Wheelchair Mobility:   Patient propelled wheelchair 150 feet with supervision. Provided verbal cues for turning technique and correcting veering R.  Instructed pt in results of PT evaluation as detailed above, PT POC, rehab potential, rehab goals, and discharge recommendations. Additionally discussed CIR's policies regarding fall safety and use of chair alarm and/or quick release belt. Pt verbalized understanding and in agreement. Will update pt's family members as they become available.   Patient in w/c in the room at end of session with breaks locked, seat belt alarm set, and all needs within reach.     Discharge Criteria: Patient will be discharged from PT if patient refuses treatment 3 consecutive times without medical reason, if treatment goals not met, if there is a change in medical status, if patient makes no progress towards goals or if patient is discharged from hospital.  The above assessment, treatment plan, treatment alternatives and goals were discussed and mutually agreed upon: by patient and No family available/patient unable  Gaylen Venning L Aubriegh Minch PT, DPT, NCS, CBIS  03/23/2022, 12:51 PM

## 2022-03-23 NOTE — Patient Care Conference (Signed)
Inpatient RehabilitationTeam Conference and Plan of Care Update Date: 03/23/2022   Time: 10:12 AM    Patient Name: William Barron      Medical Record Number: [id number]  Date of Birth: 13-May-1945 Sex: Male         Room/Bed: 4M07C/4M07C-01 Payor Info: Payor: [employer] / Plan: [place] / Product Type: *No Product type* /    Admit Date/Time:  03/22/2022  4:39 PM  Primary Diagnosis:  Acute CVA (cerebrovascular accident) [facility])  Hospital Problems: Principal Problem:   Acute CVA (cerebrovascular accident) [facility])    Expected Discharge Date: Expected Discharge Date: 04/06/22  Team Members Present: Physician leading conference: Dr. Alger Simons Social Worker Present: Loralee Pacas, [place] Nurse Present: Tacy Learn, RN PT Present: Apolinar Junes, PT OT Present: Mariane Masters, OT SLP Present: Weston Anna, SLP PPS Coordinator present : Ileana Ladd, PT     Current Status/Progress Goal Weekly Team Focus  Bowel/Bladder   Patient is continent of urine  *** Maintain continence of bladder and bowel   Assess toileting needs, assess prn    Swallow/Nutrition/ Hydration   Dys. 2 textures with thin liquids, Min A  *** Mod I  tolerance of current diet    ADL's   min-mod A ADLs, min-mod A transfers, poor activity tolerance, decreased balance, diplopia  *** S   balance, endurance, BADL retraining, vision assessment    Mobility   Eval Pending.  ***         Communication      ***          Safety/Cognition/ Behavioral Observations  Mod-Max A  *** Min A   functional problem solving, attention, recall and awareness    Pain      ***      Skin      ***        Discharge Planning:  TBA. Per EMR, Pt wife will provide intermittent supervision and their son intends to move in to help with providing care. SW will confirm there are no barriers to discharge.   Team Discussion: Acute CVA. Continent of urine with time  toileting. Incontinent of stool. Denies pain. MASD to buttocks. Droplet contact removed d/t no symptoms. Chronic right foot drop. Thrush treat with oral hygiene. OT min/mod A with ADLs and transfers. Poor activity tolerance, decreased balance with diplopia. PT evaluations pending. Mod/Max with speech. Ween O2 to RA. Patient on target to meet rehab goals: Evaluations being completed today  *See Care Plan and progress notes for long and short-term goals.   Revisions to Treatment Plan:  Medication adjustments, monitor labs  Teaching Needs: Medications, safety, gait/transfer training, skin/wound care, etc  Current Barriers to Discharge: Decreased caregiver support, Home enviroment access/layout, Incontinence, and Wound care  Possible Resolutions to Barriers: Family education, nursing education, order recommended DME     Medical Summary Current Status: right PCA infarct with balance and coordination issues. weakend. poor nutrition, dysphagia.  Barriers to Discharge: Medical stability   Possible Resolutions to [employer] Focus: daily assessment of labs and pt data. nutitional recs/adjustments   Continued Need for Acute Rehabilitation Level of Care: The patient requires daily medical management by a physician with specialized training in physical medicine and rehabilitation for the following reasons: Direction of a multidisciplinary physical rehabilitation program to maximize functional independence : Yes Medical management of patient stability for increased activity during participation in an intensive rehabilitation regime.: Yes Analysis of laboratory values and/or radiology reports with any subsequent need for medication adjustment  and/or medical intervention. : Yes   I attest that I was present, lead the team conference, and concur with the assessment and plan of the team.   Ernest Pine 03/23/2022, 12:37 PM

## 2022-03-23 NOTE — Progress Notes (Addendum)
Patient ID: William Barron, male   DOB: Apr 25, 1945, 77 y.o.   MRN: [id number]   Advanced Heart Failure Rounding Note  PCP-Cardiologist: Minus Breeding, MD   Subjective:    Tolerating Entresto ok, SBP 90s-low 100s.   Getting TFs through [facility], PO intake improving. Trush improving  Remains weak/deconditioned. Patient now in [place].   Feels better this morning, sitting on EOB working with OT.    Objective:   Weight Range: 54.2 kg Body mass index is 19.88 kg/m.   Vital Signs:   Temp:  [97.5 F (36.4 C)-97.7 F (36.5 C)] 97.5 F (36.4 C) (01/09 0408) Pulse Rate:  [73-79] 73 (01/09 0408) Resp:  [16-17] 17 (01/09 0408) BP: (100)/(54) 100/54 (01/08 1652) SpO2:  [94 %] 94 % (01/08 1652) Weight:  [54.2 kg-56.2 kg] 54.2 kg (01/09 0417) Last BM Date : 03/23/22  Weight change: Filed Weights   03/22/22 1652 03/23/22 0417  Weight: 56.2 kg 54.2 kg    Intake/Output:   Intake/Output Summary (Last 24 hours) at 03/23/2022 0927 Last data filed at 03/23/2022 0847 Gross per 24 hour  Intake 798 ml  Output 800 ml  Net -2 ml      Physical Exam    General:  chronically ill / elderly / decomditioned appearing.  No respiratory difficulty HEENT: +cortrack Neck: supple. JVD ~7 cm. Carotids 2+ bilat; no bruits. No lymphadenopathy or thyromegaly appreciated. Cor: PMI nondisplaced. Regular rate & rhythm. No rubs, gallops or murmurs. Lungs: clear Abdomen: soft, nontender, nondistended. No hepatosplenomegaly. No bruits or masses. Good bowel sounds. Extremities: no cyanosis, clubbing, rash, edema  Neuro: alert & oriented x 3, cranial nerves grossly intact. moves all 4 extremities w/o difficulty. Affect pleasant.   Telemetry   Not on tele  Labs    CBC Recent Labs    03/22/22 1019 03/23/22 0651  WBC 8.3 7.6  NEUTROABS  --  6.4  HGB 10.1* 9.9*  HCT 33.0* 32.5*  MCV 77.5* 76.5*  PLT 632* 566*    Basic Metabolic Panel Recent Labs    03/22/22 1019  NA 139  K 4.7  CL 108   CO2 24  GLUCOSE 217*  BUN 33*  CREATININE 1.36*  CALCIUM 8.6*   Liver Function Tests No results for input(s): "AST", "ALT", "ALKPHOS", "BILITOT", "PROT", "ALBUMIN" in the last 72 hours.  No results for input(s): "LIPASE", "AMYLASE" in the last 72 hours. Cardiac Enzymes No results for input(s): "CKTOTAL", "CKMB", "CKMBINDEX", "TROPONINI" in the last 72 hours.  BNP: BNP (last 3 results) Recent Labs    03/07/22 1237  BNP 1,326.2*    ProBNP (last 3 results) No results for input(s): "PROBNP" in the last 8760 hours.   D-Dimer No results for input(s): "DDIMER" in the last 72 hours. Hemoglobin A1C No results for input(s): "HGBA1C" in the last 72 hours. Fasting Lipid Panel No results for input(s): "CHOL", "HDL", "LDLCALC", "TRIG", "CHOLHDL", "LDLDIRECT" in the last 72 hours. Thyroid Function Tests No results for input(s): "TSH", "T4TOTAL", "T3FREE", "THYROIDAB" in the last 72 hours.  Invalid input(s): "FREET3"  Other results:   Imaging    DG ESOPHAGUS W SINGLE CM (SOL OR THIN BA)  Result Date: 03/22/2022 CLINICAL DATA:  Patient with a history of right occipital CVA with dysphagia. EXAM: ESOPHAGUS/BARIUM SWALLOW/TABLET STUDY TECHNIQUE: Single contrast examination was performed using thin liquid barium. This exam was performed by Soyla Dryer, NP, and was supervised and interpreted by Dr. Annamaria Boots . FLUOROSCOPY: Radiation Exposure Index (as provided by the fluoroscopic device): 2.30 mGy  Kerma COMPARISON:  DG swallow 03/15/2022. FINDINGS: Swallowing: Extreme delay in the oropharyngeal phase. Patient required multiple verbal prompts to swallow the barium. No vestibular penetration or aspiration seen. Pharynx: Unremarkable. Esophagus: Normal appearance. Esophageal motility: Within normal limits. Hiatal Hernia: None. Gastroesophageal reflux: None visualized. Ingested 31m barium tablet: Not given Other: None. IMPRESSION: Significantly limited study. Patient lethargic requiring extensive  prompting to follow commands. Only one swallow was observed due to patient's limited ability to follow commands and his limited ability to swallow the barium. Read by: JSoyla Dryer NP Electronically Signed   By: MJerilynn Mages  Shick M.D.   On: 03/22/2022 11:23     Medications:     Scheduled Medications:  acidophilus  1 capsule Oral Daily   amiodarone  400 mg Per Tube BID   Followed by   [Derrill MemoON 03/26/2022] amiodarone  400 mg Per Tube Daily   [START ON 03/24/2022] aspirin  325 mg Per Tube Daily   clopidogrel  75 mg Per Tube Daily   dapagliflozin propanediol  10 mg Oral Daily   diclofenac Sodium  2 g Topical QID   [START ON 03/24/2022] famotidine  20 mg Per Tube Daily   feeding supplement (PROSource TF20)  60 mL Per Tube Daily   heparin  5,000 Units Subcutaneous Q8H   insulin aspart  0-9 Units Subcutaneous TID WC   insulin aspart  5 Units Subcutaneous TID WC   insulin glargine-yfgn  15 Units Subcutaneous Daily   lidocaine  1 patch Transdermal Q24H   lipase/protease/amylase  72,000 Units Oral Q6H   megestrol  400 mg Per Tube Daily   mirabegron ER  25 mg Oral Daily   nystatin  5 mL Oral QID   rosuvastatin  10 mg Per Tube Daily   sacubitril-valsartan  1 tablet Per Tube BID   spironolactone  12.5 mg Per Tube Daily   testosterone  5 g Transdermal QHS    Infusions:  feeding supplement (JEVITY 1.5 CAL/FIBER) 1,000 mL (03/22/22 1853)    PRN Medications: acetaminophen, alum & mag hydroxide-simeth, diphenhydrAMINE, guaiFENesin-dextromethorphan, magnesium hydroxide, methocarbamol, prochlorperazine **OR** prochlorperazine **OR** prochlorperazine, sodium phosphate, sorbitol, traZODone, white petrolatum    Assessment/Plan   1. Acute on chronic systolic CHF: Echo (92/94 with EF 55-60%.  Echo 12/23 with EF 30-35%, normal RV, normal IVC.  LHC/RHC 12/23 with moderate CAD (60% mLAD, 70% dLAD, 70% D2, 60% dRCA) and low CI 1.87 with normal right and left heart filling pressures.  CAD does not explain  fall in EF.  Cardiac MRI showed LV EF 33% with diffuse hypokinesis, RV EF 46% predominantly mid-wall LGE with some subendocardial component in the basal inferolateral wall and focally in the mid lateral wall.  Pattern is not clearly due to prior MI but cannot rule this out given moderate CAD on cath. Alternatively could be due to prior myocarditis. T2 not elevated in the basal inferolateral wall, so do not suspect active inflammation. Findings NOT consistent with cardiac AL amyloidosis. On exam and [place] he is not volume overloaded.   - Continue Entresto 24-26 mg bid  - Does not need a diuretic currently, monitor wt/I/os closely w/ TFs  - Continue dapagliflozin and spironolactone.  - Would hold off on inotropic support for the time being, think main problem at this time is PNA/lung infiltrates and failure to thrive/malnutrition.  2. CVA: Right occipital CVA with distal right PCA severe stenosis vs occlusion. Presented with visual loss, now improved.  Neurology has signed off.  - ASA 325 +  Plavix 75 x 3 months then ASA 81 daily.  - Crestor 10 daily 3. VT arrest: 1 minute CPR with ROSC.  He is on amiodarone.  Suspect triggered by lateral wall scar (prior MI versus myocarditis).  EP has seen, recommended amiodarone. Not likely a good ICD candidate given overall prognosis, no lifevest (DNR now). - Continue amiodarone po. Plan to decrease to 400 daily on Friday (1/12) per EP recs 4. CAD: LHC this admission with moderate CAD (60% mLAD, 70% dLAD, 70% D2, 60% dRCA).  Out of proportion to cardiomyopathy.  As noted above, cMRI suggested most likely prior myocarditis but cannot fully rule out prior MI involving basal lateral wall. No chest pain.  - Continue ASA as above.  - Crestor 10 mg daily.  5. Multiple myeloma with AL amyloidosis isolated to lymph nodes: Followed at [facility] since 2014. Treated with chemo in past. Has known bulky mediastinal/hilar lymphadenopathy. Has been off chemo/steroids for a year  d/t frailty 6. Prostate cancer: Treated with radiation in 2022.  7. Right eye diplopia: Chronic, 6th nerve palsy.  8. H/o Whipple procedure for IPMN in 5/23.  9. Influenza A: Has been treated with 5 days oseltamivir this admission.  9. Pulmonary:  CT chest this admission showed extensive bilateral multilobar PNA, R>L and bulky mediastinal/hilar LAN, seen on prior studies. He has a cough, no fever or leukocytosis and PCT not significantly elevated.  He had a VT arrest with 1 minute CPR, strong suspicion for aspiration pneumonitis.  Pulmonary has seen, no utility to bronchoscopy.  Breathing improved. Stable on RA  - Continue Augmentin for aspiration coverage.  - Had barium swallow. Concern for possible proximal esophageal stricture. Dysphagia 2 diet - SLP following - Incentive spirometry.  10. Frailty/failure to thrive: Did not recover well at all post-Whipple in 5/23 per family.  Has had poor appetite.  Having trouble swallowing.  Albumin low, malnourished.  - Mobilize, PT/OT. In CIR  - TFs through [facility] + PO intake as tolerated 11. Thrush: With odynophagia.  - Continue Nystatin.  12. GOC - He is fairly stable right now from cardiac standpoint, suspect main issue is aspiration pneumonitis and frailty/malnutrition.  - Concern for poor long-term prognosis with failure to thrive, VT arrest, PNA. Palliative care following. CODE status now DNR. Now in CIR.   Length of Stay: [place], NP  03/23/2022, 9:27 AM  Advanced Heart Failure Team Pager [phone] (M-F; 7a - 5p)  Please contact [facility] for night-coverage after hours (5p -7a ) and weekends on [email or web address]  Agree with NP note.   Continue current cardiac meds, we will follow at a distance.  Call with questions.   Loralie Champagne 03/23/2022

## 2022-03-23 NOTE — Evaluation (Signed)
Occupational Therapy Assessment and Plan  Patient Details  Name: William Barron MRN: [id number] Date of Birth: [birth year]  OT Diagnosis: abnormal posture, cognitive deficits, disturbance of vision, hemiplegia affecting dominant side, lumbago (low back pain), and muscle weakness (generalized) Rehab Potential: Rehab Potential (ACUTE ONLY): Good ELOS: 2 weeks   Today's Date: 03/23/2022 OT Individual Time: 0900-1000 OT Individual Time Calculation (min): 60 min     Hospital Problem: Principal Problem:   Acute CVA (cerebrovascular accident) [facility])   Past Medical History:  Past Medical History:  Diagnosis Date   Amyloidosis ([place])    Diabetes mellitus without complication ([place])    Feeding tube dysfunction, initial encounter 08/12/2021   GERD (gastroesophageal reflux disease)    Hearing loss    Has hearing aids   History of kidney stones    Hyperlipidemia    Jejunostomy tube present ([place])    Multiple myeloma ([place])    and amylodosis    Nephrolithiasis    Occasional tremors    Pancreatic insufficiency    Pneumonia    Prostate cancer ([place])    Prostatitis    acute and chronic   Skin cancer    Past Surgical History:  Past Surgical History:  Procedure Laterality Date   APPENDECTOMY     CARDIAC CATHETERIZATION N/A 06/13/2015   Procedure: Left Heart Cath and Coronary Angiography;  Surgeon: Jettie Booze, MD;  Location: [place] CV LAB;  Service: Cardiovascular;  Laterality: N/A;   COLONOSCOPY  04/07/2000   ELBOW SURGERY  03/15/2001   right   ESOPHAGOGASTRODUODENOSCOPY (EGD) WITH PROPOFOL N/A 09/07/2021   Procedure: ESOPHAGOGASTRODUODENOSCOPY (EGD) WITH PROPOFOL;  Surgeon: Irene Shipper, MD;  Location: [place];  Service: Gastroenterology;  Laterality: N/A;   EXTRACORPOREAL SHOCK WAVE LITHOTRIPSY Left 07/17/2018   Procedure: EXTRACORPOREAL SHOCK WAVE LITHOTRIPSY (ESWL);  Surgeon: [name], MD;  Location: [facility];  Service: Urology;  Laterality: Left;   EXTRACORPOREAL  SHOCK WAVE LITHOTRIPSY Right 12/08/2020   Procedure: RIGHT EXTRACORPOREAL SHOCK WAVE LITHOTRIPSY (ESWL);  Surgeon: Raynelle Bring, MD;  Location[facility];  Service: Urology;  Laterality: Right;   FOOT ARTHRODESIS Right 04/15/2021   Procedure: RIGHT SUBTALAR AND TALONAVICULAR FUSION;  Surgeon: Newt Minion, MD;  Location: [place];  Service: Orthopedics;  Laterality: Right;   HERNIA REPAIR     IR MECH REMOV OBSTRUC MAT ANY COLON TUBE W/FLUORO  08/27/2021   IR REPLC GASTRO/COLONIC TUBE PERCUT W/FLUORO  09/08/2021   RIGHT/LEFT HEART CATH AND CORONARY ANGIOGRAPHY N/A 03/11/2022   Procedure: RIGHT/LEFT HEART CATH AND CORONARY ANGIOGRAPHY;  Surgeon: Jettie Booze, MD;  Location: [place] [facility];  Service: Cardiovascular;  Laterality: N/A;   ROTATOR CUFF REPAIR     WHIPPLE PROCEDURE  07/13/2021    Assessment & Plan Clinical Impression: 77 year old male admitted 12/22 with right-sided headache and blurred vision. +Acute right PCA stroke. During hospitalization pt found to be Flu + on 03/07/22. Pt also had code blue called on 03/11/22. Pt received compressions before regaining ROSC s/p VT arrest. Pt underwent cardiac cath on 03/11/22. PMHX: amyloidosis, multiple myeloma, history of prostate cancer, history of pancreatic cancer status post Whipple, dyslipidemia, diabetes   Patient currently requires mod with basic self-care skills secondary to muscle weakness, decreased cardiorespiratoy endurance, decreased attention, decreased awareness, decreased problem solving, decreased safety awareness, and delayed processing, and decreased sitting balance, decreased standing balance, decreased postural control, and decreased balance strategies.  Prior to hospitalization, patient could complete BADL/AIDL with modified independent .  Patient  will benefit from skilled intervention to decrease level of assist with basic self-care skills and increase independence with basic self-care skills prior  to discharge home with care partner.  Anticipate patient will require 24 hour supervision and follow up home health.  OT - End of Session Activity Tolerance: Tolerates 30+ min activity with multiple rests Endurance Deficit: Yes OT Assessment Rehab Potential (ACUTE ONLY): Good OT Barriers to Discharge: Decreased caregiver support;Lack of/limited family support OT Barriers to Discharge Comments: wife unable to provide physcial assist OT Patient demonstrates impairments in the following area(s): Balance;Cognition;Endurance;Pain;Perception;Vision OT Basic ADL's Functional Problem(s): Grooming;Bathing;Dressing;Toileting OT Transfers Functional Problem(s): Toilet;Tub/Shower OT Plan OT Intensity: Minimum of 1-2 x/day, 45 to 90 minutes OT Frequency: 5 out of 7 days OT Duration/Estimated Length of Stay: 2 weeks OT Treatment/Interventions: Balance/vestibular training;Discharge planning;Pain management;Self Care/advanced ADL retraining;Therapeutic Activities;UE/LE Coordination activities;Visual/perceptual remediation/compensation;Therapeutic Exercise;Skin care/wound managment;Patient/family education;Functional mobility training;Disease mangement/prevention;Cognitive remediation/compensation;Community reintegration;DME/adaptive equipment instruction;Neuromuscular re-education;Psychosocial support;Splinting/orthotics;UE/LE Strength taining/ROM;Wheelchair propulsion/positioning OT Self Feeding Anticipated Outcome(s): S OT Basic Self-Care Anticipated Outcome(s): S OT Toileting Anticipated Outcome(s): S OT Bathroom Transfers Anticipated Outcome(s): S OT Recommendation Patient destination: Home Follow Up Recommendations: Home health OT Equipment Recommended: None recommended by OT Equipment Details: has shower chair and BSC from previous CIR stay   OT Evaluation Precautions/Restrictions  Precautions Precautions: Fall;Other (comment) Precaution Comments: vision impaired, feeding tube Required Braces  or Orthoses: Other Brace Other Brace: RLE AFO and shoes, pt and son think he does worse with AFO on (per chart) Restrictions Weight Bearing Restrictions: No General Chart Reviewed: Yes Family/Caregiver Present: No Vital Signs  Pain Pain Assessment Pain Score: 0-No pain Pain Type: Acute pain Pain Location: Back Pain Descriptors / Indicators: Aching Pain Intervention(s): Medication (See eMAR) Home Living/Prior Functioning Home Living Available Help at Discharge: Family, Available 24 hours/day Type of Home: House Home Access: Stairs to enter Technical brewer of Steps: 4 Entrance Stairs-Rails: Right Home Layout: Two level, 1/2 bath on main level Alternate Level Stairs-Number of Steps: has chair lift Alternate Level Stairs-Rails: Left Additional Comments: Using RW to ambulate at home. Wife cooks meals. (per chart)  Lives With: Spouse Prior Function Level of Independence: Independent with gait, Independent with transfers, Requires assistive device for independence (per pt, need to conmfirm with family) Vocation: Retired Surveyor, mining Baseline Vision/History: 1 Wears glasses Ability to See in Adequate Light: 0 Adequate Patient Visual Report: Diplopia Vision Assessment?: Yes Eye Alignment: Impaired (comment) Tracking/Visual Pursuits: Right eye does not track laterally Perception  Perception: Impaired Praxis Praxis: Intact Cognition Cognition Overall Cognitive Status: Impaired/Different from baseline Arousal/Alertness: Awake/alert Memory: Impaired Memory Impairment: Retrieval deficit Attention: Sustained Sustained Attention: Impaired Sustained Attention Impairment: Verbal basic;Functional basic Awareness: Impaired Awareness Impairment: Emergent impairment Problem Solving: Impaired Problem Solving Impairment: Verbal basic;Functional basic Safety/Judgment: Impaired Sensation Sensation Light Touch: Appears Intact Coordination Gross Motor Movements are Fluid and  Coordinated: No Fine Motor Movements are Fluid and Coordinated: No Coordination and Movement Description: slow and deliberate Motor  Motor Motor: Hemiplegia Motor - Skilled Clinical Observations: chronic R hemi  Trunk/Postural Assessment  Cervical Assessment Cervical Assessment:  (forward head) Thoracic Assessment Thoracic Assessment:  (rounded shoudlers) Lumbar Assessment Lumbar Assessment:  (post pelvic tilt) Postural Control Postural Control: Deficits on evaluation (delayed/insufficient)  Balance Balance Balance Assessed: Yes Dynamic Sitting Balance Dynamic Sitting - Level of Assistance: 5: Stand by assistance;4: Min assist Static Standing Balance Static Standing - Level of Assistance: 3: Mod assist Extremity/Trunk Assessment RUE Assessment General Strength Comments: generalized weakness LUE Assessment General Strength Comments: generalized weakness  Care  Tool Care Tool Self Care Eating        Oral Care    Oral Care Assist Level: Minimal Assistance - Patient > 75%    Bathing   Body parts bathed by patient: Right arm;Left arm;Chest;Abdomen;Front perineal area;Right upper leg;Left upper leg;Face Body parts bathed by helper: Buttocks;Right lower leg;Left lower leg        Upper Body Dressing(including orthotics)   What is the patient wearing?: Pull over shirt   Assist Level: Minimal Assistance - Patient > 75%    Lower Body Dressing (excluding footwear)   What is the patient wearing?: Pants Assist for lower body dressing: Maximal Assistance - Patient 25 - 49%    Putting on/Taking off footwear   What is the patient wearing?: Non-skid slipper socks;Ted hose Assist for footwear: Dependent - Patient 0%       Care Tool Toileting Toileting activity   Assist for toileting: Maximal Assistance - Patient 25 - 49%     Care Tool Bed Mobility Roll left and right activity        Sit to lying activity        Lying to sitting on side of bed activity         Care  Tool Transfers Sit to stand transfer        Chair/bed transfer   Chair/bed transfer assist level: Moderate Assistance - Patient 50 - 74%     Toilet transfer   Assist Level: Moderate Assistance - Patient 50 - 74%     Care Tool Cognition  Expression of Ideas and Wants Expression of Ideas and Wants: 3. Some difficulty - exhibits some difficulty with expressing needs and ideas (e.g, some words or finishing thoughts) or speech is not clear  Understanding Verbal and Non-Verbal Content Understanding Verbal and Non-Verbal Content: 3. Usually understands - understands most conversations, but misses some part/intent of message. Requires cues at times to understand   Memory/Recall Ability Memory/Recall Ability : Current season;That he or she is in a hospital/hospital unit   Refer to Care Plan for [place] 1 OT Short Term Goal 1 (Week 1): Pt iwll transfer to toilet wiht LRAD and MIN A OT Short Term Goal 2 (Week 1): Pt will locate all needed ADL items with no cuing to demo improved vision/compensatory strategies OT Short Term Goal 3 (Week 1): Pt will complete dressing sit to stand wiht MIN A only for balance  Recommendations for other services: Therapeutic Recreation  Pet therapy   Skilled Therapeutic Intervention ADL ADL Grooming: Minimal assistance Where Assessed-Grooming: Edge of bed Upper Body Bathing: Supervision/safety Where Assessed-Upper Body Bathing: Edge of bed Lower Body Bathing: Moderate assistance Where Assessed-Lower Body Bathing: Edge of bed Upper Body Dressing: Minimal assistance Where Assessed-Upper Body Dressing: Edge of bed Lower Body Dressing: Moderate assistance Where Assessed-Lower Body Dressing: Edge of bed Toileting: Maximal assistance Where Assessed-Toileting: Bedside Commode Toilet Transfer: Moderate assistance Toilet Transfer Method: Stand pivot Mobility  Transfers Sit to Stand: Moderate Assistance - Patient 50-74% Stand to  Sit: Moderate Assistance - Patient 50-74%   Pt received in bed agreeable to OT. Pt educated on role/purpose, CIR, ELOS, POC and CVA recovery. Pt completes BADL at EOB and RA and remains saturating >95% throughout session and with mobility. Pt with very poor activity tolerance throughout session needing a supine rest break prior to LB dressing after sitting EOB. Pt requires A to thread 1LE and advance pants past hips in standing d/t fatigue.  Pt demo crouched posture in stance with poor ability to correct. Lateral stepping to [facility] with min-mod A. Exited session with pt seated in bed, exit alarm on and call light in reach. Further vision assessment to follow in next session   Discharge Criteria: Patient will be discharged from OT if patient refuses treatment 3 consecutive times without medical reason, if treatment goals not met, if there is a change in medical status, if patient makes no progress towards goals or if patient is discharged from hospital.  The above assessment, treatment plan, treatment alternatives and goals were discussed and mutually agreed upon: by patient  [place]ura GROSSER 03/23/2022, 12:20 PM

## 2022-03-23 NOTE — Plan of Care (Signed)
  Problem: RH Swallowing Goal: LTG Patient will consume least restrictive diet using compensatory strategies with assistance (SLP) Description: LTG:  Patient will consume least restrictive diet using compensatory strategies with assistance (SLP) Flowsheets (Taken 03/23/2022 1048) LTG: Pt Patient will consume least restrictive diet using compensatory strategies with assistance of (SLP): Modified Independent   Problem: RH Problem Solving Goal: LTG Patient will demonstrate problem solving for (SLP) Description: LTG:  Patient will demonstrate problem solving for basic/complex daily situations with cues  (SLP) Flowsheets (Taken 03/23/2022 1048) LTG: Patient will demonstrate problem solving for (SLP): Basic daily situations LTG Patient will demonstrate problem solving for: Supervision   Problem: RH Memory Goal: LTG Patient will demonstrate ability for day to day (SLP) Description: LTG:   Patient will demonstrate ability for day to day recall/carryover during cognitive/linguistic activities with assist  (SLP) Flowsheets (Taken 03/23/2022 1048) LTG: Patient will demonstrate ability for day to day recall/carryover during cognitive/linguistic activities with assist (SLP): Minimal Assistance - Patient > 75% Goal: LTG Patient will use memory compensatory aids to (SLP) Description: LTG:  Patient will use memory compensatory aids to recall biographical/new, daily complex information with cues (SLP) Flowsheets (Taken 03/23/2022 1048) LTG: Patient will use memory compensatory aids to (SLP): Minimal Assistance - Patient > 75%   Problem: RH Attention Goal: LTG Patient will demonstrate this level of attention during functional activites (SLP) Description: LTG:  Patient will will demonstrate this level of attention during functional activites (SLP) Flowsheets (Taken 03/23/2022 1048) Patient will demonstrate during cognitive/linguistic activities the attention type of: Selective LTG: Patient will demonstrate this level  of attention during cognitive/linguistic activities with assistance of (SLP): Supervision   Problem: RH Awareness Goal: LTG: Patient will demonstrate awareness during functional activites type of (SLP) Description: LTG: Patient will demonstrate awareness during functional activites type of (SLP) Flowsheets (Taken 03/23/2022 1048) Patient will demonstrate during cognitive/linguistic activities awareness type of: Emergent LTG: Patient will demonstrate awareness during cognitive/linguistic activities with assistance of (SLP): Supervision

## 2022-03-23 NOTE — Progress Notes (Signed)
Inpatient Rehabilitation  Patient information reviewed and entered into eRehab system by Avarose Mervine Josemanuel Eakins, OTR/L, Rehab Quality Coordinator.   Information including medical coding, functional ability and quality indicators will be reviewed and updated through discharge.   

## 2022-03-23 NOTE — Progress Notes (Addendum)
PROGRESS NOTE   Subjective/Complaints: Had a fair night. Sleep was ok. Ate part of a yogurt for breakfast this am.  ROS: Patient denies fever, rash, sore throat, blurred vision, dizziness, nausea, vomiting, diarrhea, cough, shortness of breath or chest pain, joint or back/neck pain, headache, or mood change.    Objective:   DG ESOPHAGUS W SINGLE CM (SOL OR THIN BA)  Result Date: 03/22/2022 CLINICAL DATA:  Patient with a history of right occipital CVA with dysphagia. EXAM: ESOPHAGUS/BARIUM SWALLOW/TABLET STUDY TECHNIQUE: Single contrast examination was performed using thin liquid barium. This exam was performed by Soyla Dryer, NP, and was supervised and interpreted by Dr. Annamaria Boots . FLUOROSCOPY: Radiation Exposure Index (as provided by the fluoroscopic device): 2.30 mGy Kerma COMPARISON:  DG swallow 03/15/2022. FINDINGS: Swallowing: Extreme delay in the oropharyngeal phase. Patient required multiple verbal prompts to swallow the barium. No vestibular penetration or aspiration seen. Pharynx: Unremarkable. Esophagus: Normal appearance. Esophageal motility: Within normal limits. Hiatal Hernia: None. Gastroesophageal reflux: None visualized. Ingested 45m barium tablet: Not given Other: None. IMPRESSION: Significantly limited study. Patient lethargic requiring extensive prompting to follow commands. Only one swallow was observed due to patient's limited ability to follow commands and his limited ability to swallow the barium. Read by: JSoyla Dryer NP Electronically Signed   By: MJerilynn Mages  Shick M.D.   On: 03/22/2022 11:23   Recent Labs    03/22/22 1019 03/23/22 0651  WBC 8.3 7.6  HGB 10.1* 9.9*  HCT 33.0* 32.5*  PLT 632* 566*   Recent Labs    03/22/22 1019  NA 139  K 4.7  CL 108  CO2 24  GLUCOSE 217*  BUN 33*  CREATININE 1.36*  CALCIUM 8.6*    Intake/Output Summary (Last 24 hours) at 03/23/2022 0905 Last data filed at 03/23/2022  0847 Gross per 24 hour  Intake 148 ml  Output 800 ml  Net -652 ml        Physical Exam: Vital Signs Blood pressure (!) 100/54, pulse 73, temperature (!) 97.5 F (36.4 C), temperature source Oral, resp. rate 17, height '5\' 5"'$  (1.651 m), weight 54.2 kg, SpO2 94 %.  General: Alert and oriented x 3, No apparent distress HEENT: Head is normocephalic, atraumatic, PERRLA, EOMI, sclera anicteric, oral mucosa with white thrush, dentition intact, ext ear canals clear, NGT Neck: Supple without JVD or lymphadenopathy Heart: Reg rate and rhythm. No murmurs rubs or gallops Chest: CTA bilaterally with reasonable air movement.  Abdomen: Soft, non-tender, non-distended, bowel sounds positive. Extremities: No clubbing, cyanosis, or edema. Pulses are 2+ Psych: Pt's affect is appropriate. Pt is cooperative Skin: Clean and intact without signs of breakdown Neuro:  alert, oriented to person, place, reason. Fair awareness. Follows commands. STM deficits. Speech dysarthric. Right CN VI. RUE 4+, LUE 4/5. RLE 4/5 prox with foot drop distally. LLE 4/5. Intentional tremor bilaterally Musculoskeletal: no pain with AROm/PROM    Assessment/Plan: 1. Functional deficits which require 3+ hours per day of interdisciplinary therapy in a comprehensive inpatient rehab setting. Physiatrist is providing close team supervision and 24 hour management of active medical problems listed below. Physiatrist and rehab team continue to assess barriers to discharge/monitor patient progress toward functional and  medical goals  Care Tool:  Bathing              Bathing assist       Upper Body Dressing/Undressing Upper body dressing        Upper body assist      Lower Body Dressing/Undressing Lower body dressing            Lower body assist       Toileting Toileting    Toileting assist       Transfers Chair/bed transfer  Transfers assist           Locomotion Ambulation   Ambulation assist               Walk 10 feet activity   Assist           Walk 50 feet activity   Assist           Walk 150 feet activity   Assist           Walk 10 feet on uneven surface  activity   Assist           Wheelchair     Assist               Wheelchair 50 feet with 2 turns activity    Assist            Wheelchair 150 feet activity     Assist          Blood pressure (!) 100/54, pulse 73, temperature (!) 97.5 F (36.4 C), temperature source Oral, resp. rate 17, height '5\' 5"'$  (1.651 m), weight 54.2 kg, SpO2 94 %.  Medical Problem List and Plan: 1. Functional deficits secondary to right occipital infarct d/t right PCA stenosis/occlusion, multiple medical conditions             -patient may shower             -ELOS/Goals: 14-17 days, supervision to min assist with PT and OT, supervision to mod I with SLP.  --Patient is beginning CIR therapies today including PT, OT, and SLP. Will also speak about him in team conference today. 2.  Antithrombotics: -DVT/anticoagulation:  Pharmaceutical: Heparin             -antiplatelet therapy: Aspirin 325 mg daily plus Plavix 75 mg daily for 3 months then aspirin alone   3. Pain Management: Tylenol, Lidoderm as needed             -Voltaren gel   4. Mood/Behavior/Sleep: LCSW to evaluate and provide emotional support             -antipsychotic agents: n/a   5. Neuropsych/cognition: This patient is capable of making decisions on his own behalf.   6. Skin/Wound Care: Routine skin care checks   7. Fluids/Electrolytes/Nutrition: Strict I's and O's and follow-up chemistries             -continue tube feeds via Cortrak for now due to malnutrition             -continue Megace --> increase to 400 mg bid             -continue Prosource daily             -Dysphagia 2 diet, thin liquids; advance per SLP recs             -Continue pancreatic enzymes q 6 hours             -continue  probiotics              -continue Protonix for GI prophylaxis   8: VT arrest: Likely due to prior MI versus myocarditis status post EP eval             -follow-up with cardiolgy (meds as per #10)             -apparently no plan now for life vest/ICD 9: CAD status post left heart catheterization             -Continue aspirin and statin   10: Acute on chronic systolic heart failure: currently not requiring diuretic             -Daily weight             -Continue Entresto 24-26 mg BID             -Continue dapagliflozin 10 mg daily             -Continue spironolactone 12.5 mg daily             -continue amiodarone 400 mg BID             -continue carvedilol 3.125 mg BID   Filed Weights   03/22/22 1652 03/23/22 0417  Weight: 56.2 kg 54.2 kg    11: Cardiomyopathy: Likely due to myocarditis versus prior MI   12: Bilateral pneumonia; likely aspiration pneumonitis             -completed antibiotics             -CT chest 12/30: R>L bronchopneumonia, chronic sarcoidosis   13: Intraductal papillary mucinous neoplasms of the pancreas status post Whipple procedure 2023             -Continue pancreatic enzymes             -Follow-up with [name]   14: Multiple myeloma with AL amyloidosis: stable; follow-up [name]   15: Influenza A: Status post oseltamivir x 5 days this admission   16: DM: continue CBGs q AC and q HS             -continue SSI             -continue Novolog 5 units with meals             -continue Semglee 15 units daily  CBG (last 3)  Recent Labs    03/22/22 1249 03/22/22 2245 03/23/22 0608  GLUCAP 210* 279* 171*    1/9 monitor for pattern 17: Oral thrush: will not change to diflucan given TP risk, complex CV hx  -continue nystating as rx'ed   18: OAB: continue Myrbetriq   19: Hyperlipidemia: continue Crestor 10 mg   20: Low testosterone: continue Androgel q HS  -pt did not recall receiving yesterday--not listed as being given yesterday either   21: Right foot drop/R common peroneal  neuropathy: continue AFO   22: Hard of hearing: wears hearing aids   23: Right parotid gland lesion seen incidentally on MRI of brain             -ENT referral as outpatient   24: Right cranial nerve VI palsy: continue right eye patch   25: Code status: DNR as above    LOS: 1 days A FACE TO FACE EVALUATION WAS PERFORMED  Meredith Staggers 03/23/2022, 9:05 AM

## 2022-03-23 NOTE — Evaluation (Signed)
Speech Language Pathology Assessment and Plan  Patient Details  Name: William Barron MRN: [id number] Date of Birth: [birth year]  SLP Diagnosis: Cognitive Impairments;Dysphagia  Rehab Potential: Excellent ELOS: 04/06/22    Today's Date: 03/23/2022 SLP Individual Time: 9211-9417 SLP Individual Time Calculation (min): 22 min   Hospital Problem: Principal Problem:   Acute CVA (cerebrovascular accident) [facility])  Past Medical History:  Past Medical History:  Diagnosis Date   Amyloidosis ([place])    Diabetes mellitus without complication ([place])    Feeding tube dysfunction, initial encounter 08/12/2021   GERD (gastroesophageal reflux disease)    Hearing loss    Has hearing aids   History of kidney stones    Hyperlipidemia    Jejunostomy tube present ([place])    Multiple myeloma ([place])    and amylodosis    Nephrolithiasis    Occasional tremors    Pancreatic insufficiency    Pneumonia    Prostate cancer ([place])    Prostatitis    acute and chronic   Skin cancer    Past Surgical History:  Past Surgical History:  Procedure Laterality Date   APPENDECTOMY     CARDIAC CATHETERIZATION N/A 06/13/2015   Procedure: Left Heart Cath and Coronary Angiography;  Surgeon: Jettie Booze, MD;  Location: [place] [facility];  Service: Cardiovascular;  Laterality: N/A;   COLONOSCOPY  04/07/2000   ELBOW SURGERY  03/15/2001   right   ESOPHAGOGASTRODUODENOSCOPY (EGD) WITH PROPOFOL N/A 09/07/2021   Procedure: ESOPHAGOGASTRODUODENOSCOPY (EGD) WITH PROPOFOL;  Surgeon: Irene Shipper, MD;  Location: [place];  Service: Gastroenterology;  Laterality: N/A;   EXTRACORPOREAL SHOCK WAVE LITHOTRIPSY Left 07/17/2018   Procedure: EXTRACORPOREAL SHOCK WAVE LITHOTRIPSY (ESWL);  Surgeon: [name], MD;  Location: [facility];  Service: Urology;  Laterality: Left;   EXTRACORPOREAL SHOCK WAVE LITHOTRIPSY Right 12/08/2020   Procedure: RIGHT EXTRACORPOREAL SHOCK WAVE LITHOTRIPSY (ESWL);  Surgeon: Raynelle Bring, MD;   Location[facility];  Service: Urology;  Laterality: Right;   FOOT ARTHRODESIS Right 04/15/2021   Procedure: RIGHT SUBTALAR AND TALONAVICULAR FUSION;  Surgeon: Newt Minion, MD;  Location: [place];  Service: Orthopedics;  Laterality: Right;   HERNIA REPAIR     IR MECH REMOV OBSTRUC MAT ANY COLON TUBE W/FLUORO  08/27/2021   IR REPLC GASTRO/COLONIC TUBE PERCUT W/FLUORO  09/08/2021   RIGHT/LEFT HEART CATH AND CORONARY ANGIOGRAPHY N/A 03/11/2022   Procedure: RIGHT/LEFT HEART CATH AND CORONARY ANGIOGRAPHY;  Surgeon: Jettie Booze, MD;  Location: [place] [facility];  Service: Cardiovascular;  Laterality: N/A;   ROTATOR CUFF REPAIR     WHIPPLE PROCEDURE  07/13/2021    Assessment / Plan / Recommendation Clinical Impression Patient is a 77 year old male history of amyloidosis, multiple myeloma, history of prostate cancer, history of pancreatic cancer status post Whipple, dyslipidemia, diabetes insulin who presented to the [facility] ED on 03/05/22 with right-sided headache and blurred vision.  CT showed right PCA infarct.  CTA head and neck right PCA distal P3 occlusion, left VA origin stenosis.  MRI confirmed right PCA infarct.  EF 30 to 35%, LE venous Doppler no DVT.  Transthoracic Echocardiogram significant for LVEF of 30-35% with normal RV function. Left/right heart catheterization significant for moderate CAD but per cardiology, did not explain reduction in LVEF. Cardiac MRI confirmed an LVEF of 33% with diffuse hypokinesis in addition to RVEF of 46%. Although patient has a history of AL amyloidosis, per cardiology, findings are not consistent with cardiac involvement of AL amyloidosis. Cardiology recommended continue  Lisabeth Register, spironolactone and Crestor. Pt also had code blue called on 03/11/22. Pt received compressions before regaining ROSC s/p VT arrest. Pt underwent cardiac cath on 03/11/22. Pt. Positive for Influenza A infection  During admission and completed oseltamivir  course.Remains on droplets/contact precautions.  CXR showed bibasilar lung infiltrates and patient started empirically on antibiotics for possible infection  Patient seen by SLP  who recommend dysphagia 2 diet with thin liquids. Pt. With poor intake during admission, and had cortrak placed for nutritional support.  Pt. Seen by PT/OT and they recommend CIR to assist return to PLOF.  Patient admitted 03/22/22.  Upon arrival, patient was asleep in bed. Patient awakened easily and agreeable to treatment session. Patient was incontinent of urine and followed directions for bed mobility with extra processing time needed, suspect hearing deficits impacted processing (hearing aids present). Patient repositioned in bed to maximize safety with PO intake. Patient consumed thin liquids via straw without overt s/s of aspiration but demonstrated difficulty forming a lip seal to siphon through a straw due to left labial weakness with patient independently moving the straw to the right side of his oral cavity. Patient declined all solid textures despite max encouragement but consumed Dys. 1 textures without overt s/s of aspiration and with mildly prolonged oral transit noted. Recommend patient continue current diet of Dys. 2 textures with thin liquids with full supervision to maximize safety and encouragement with overall PO intake as patient's PO intake has been poor with cortrak needed for supplemental nutrition.   Due to time constraints, a formal cognitive assessment was not administered. However, during informal assessment with functional tasks, patient demonstrated decreased orientation to time, decreased intellectual awareness of deficits, decreased sustained attention, and decreased problem solving with functional and familiar tasks. Patient's overall verbal expression and auditory comprehension appeared [facility] but extra processing time and repetition needed at times due to hearing impairments with hearing aids.   Patient  would benefit from skilled SLP intervention to maximize his swallowing and cognitive functioning and overall functional independence prior to discharge.     Skilled Therapeutic Interventions          Administered a cognitive-linguistic evaluation and CSE, please see above for details.   SLP Assessment  Patient will need skilled Speech Lanaguage Pathology Services during CIR admission    Recommendations  Medication Administration: Crushed with puree Supervision: Staff to assist with self feeding;Full supervision/cueing for compensatory strategies Compensations: Slow rate;Small sips/bites;Follow solids with liquid;Minimize environmental distractions Postural Changes and/or Swallow Maneuvers: Seated upright 90 degrees Oral Care Recommendations: Oral care BID Recommendations for Other Services: Neuropsych consult Patient destination: Home Follow up Recommendations: 24 hour supervision/assistance;Outpatient SLP;Home Health SLP Equipment Recommended: To be determined    SLP Frequency 3 to 5 out of 7 days   SLP Duration  SLP Intensity  SLP Treatment/Interventions 04/06/22  Minumum of 1-2 x/day, 30 to 90 minutes  Cognitive remediation/compensation;Dysphagia/aspiration precaution training;Internal/external aids;Therapeutic Activities;Environmental controls;Cueing hierarchy;Functional tasks;Patient/family education    Pain Pain Assessment Pain Score: 0-No pain Pain Type: Acute pain Pain Location: Back Pain Descriptors / Indicators: Aching Pain Intervention(s): Medication (See eMAR)   SLP Evaluation Cognition Overall Cognitive Status: Impaired/Different from baseline Arousal/Alertness: Awake/alert Orientation Level: Oriented to place;Oriented to person;Oriented to situation;Disoriented to time Attention: Sustained Sustained Attention: Impaired Sustained Attention Impairment: Verbal basic;Functional basic Memory: Impaired Memory Impairment: Retrieval deficit Awareness:  Impaired Awareness Impairment: Emergent impairment Problem Solving: Impaired Problem Solving Impairment: Verbal basic;Functional basic Safety/Judgment: Impaired  Comprehension Auditory Comprehension Overall Auditory Comprehension: Appears within  functional limits for tasks assessed Interfering Components: Processing speed;Hearing Expression Expression Primary Mode of Expression: Verbal Verbal Expression Overall Verbal Expression: Appears within functional limits for tasks assessed Written Expression Dominant Hand: Right Written Expression: Not tested Oral Motor Oral Motor/Sensory Function Overall Oral Motor/Sensory Function: Mild impairment Facial Strength: Reduced left Motor Speech Overall Motor Speech: Appears within functional limits for tasks assessed  Care Tool Care Tool Cognition Ability to hear (with hearing aid or hearing appliances if normally used Ability to hear (with hearing aid or hearing appliances if normally used): 1. Minimal difficulty - difficulty in some environments (e.g. when person speaks softly or setting is noisy)   Expression of Ideas and Wants Expression of Ideas and Wants: 3. Some difficulty - exhibits some difficulty with expressing needs and ideas (e.g, some words or finishing thoughts) or speech is not clear   Understanding Verbal and Non-Verbal Content Understanding Verbal and Non-Verbal Content: 3. Usually understands - understands most conversations, but misses some part/intent of message. Requires cues at times to understand  Memory/Recall Ability Memory/Recall Ability : Current season;That he or she is in a hospital/hospital unit   Bedside Swallowing Assessment General Date of Onset: 03/05/22 Previous Swallow Assessment: MBS 1/1: Recommended Dys. 2 textures with thin liquids Diet Prior to this Study: Dysphagia 2 (chopped);Thin liquids Temperature Spikes Noted: No Respiratory Status: Supplemental O2 delivered via (comment) (nasla  cannula) History of Recent Intubation: No Behavior/Cognition: Cooperative;Pleasant mood;Lethargic/Drowsy;Requires cueing Oral Cavity - Dentition: Adequate natural dentition Self-Feeding Abilities: Needs assist;Needs set up Vision: Functional for self-feeding Baseline Vocal Quality: Normal Volitional Cough: Strong Volitional Swallow: Able to elicit  Ice Chips Ice chips: Not tested Thin Liquid Thin Liquid: Impaired Presentation: Straw Oral Phase Impairments: Reduced labial seal Nectar Thick Nectar Thick Liquid: Not tested Honey Thick Honey Thick Liquid: Not tested Puree Puree: Within functional limits Presentation: Self Fed Oral Phase Functional Implications: Prolonged oral transit Solid Solid: Not tested Other Comments: patient declined all solid textures during BSE BSE Assessment Risk for Aspiration Impact on safety and function: Mild aspiration risk Other Related Risk Factors: History of GERD;Lethargy;Deconditioning;Decreased respiratory status  Short Term Goals: Week 1: SLP Short Term Goal 1 (Week 1): Patient will consume current diet with minimal overt s/s of aspiration with Min verbal cues for use of swallowing compensatory strategies. SLP Short Term Goal 2 (Week 1): Patient will demonstrate efficient mastication and complete oral clearance with trials of Dys. 3 textures without reports of a globus sensation over 2 sessions prior to upgrade. SLP Short Term Goal 3 (Week 1): Patient will demonstrate functional problem solving for basic and familiar tasks with Min verbal cues. SLP Short Term Goal 4 (Week 1): Patient will recall new, daily information with Mod verbal and visual cues for use of memory compensatory strategies. SLP Short Term Goal 5 (Week 1): Patient will self-monitor and correct errors duirng functional tasks with Min verbal cues. SLP Short Term Goal 6 (Week 1): Patient will demonstrate sustained attention to functional tasks for 15 minutes with Min verbal cues for  redirection.  Refer to Care Plan for Long Term Goals  Recommendations for other services: Neuropsych  Discharge Criteria: Patient will be discharged from SLP if patient refuses treatment 3 consecutive times without medical reason, if treatment goals not met, if there is a change in medical status, if patient makes no progress towards goals or if patient is discharged from hospital.  The above assessment, treatment plan, treatment alternatives and goals were discussed and mutually agreed upon: by patient and  by family  Demitrios Molyneux 03/23/2022, 10:49 AM

## 2022-03-23 NOTE — Discharge Summary (Signed)
Physician Discharge Summary  Patient ID: William Barron MRN: [id number] DOB/AGE: 79/25/1947 79 y.o.  Admit date: 03/22/2022 Discharge date: 04/02/2022  Discharge Diagnoses:  Principal Problem:   Acute CVA (cerebrovascular accident) [facility]) Active problems: Functional deficits secondary to acute CVA Malnutrition Poor appetite Myocarditis versus prior MI resulting in VT arrest CAD Acute on chronic systolic heart failure Cardiomyopathy Bilateral pneumonia Intraductal papillary mucinous neoplasms of the pancreas Multiple myeloma with AL amyloidosis Diabetes mellitus Oral thrush Overactive bladder Hyperlipidemia Low testosterone Right foot drop Right common radial neuropathy Hard of hearing Right parotid gland lesion Right cranial nerve VI palsy Hematochezia  Discharged Condition: poor  Significant Diagnostic Studies: DG ESOPHAGUS W SINGLE CM (SOL OR THIN BA)  Result Date: 03/22/2022 CLINICAL DATA:  Patient with a history of right occipital CVA with dysphagia. EXAM: ESOPHAGUS/BARIUM SWALLOW/TABLET STUDY TECHNIQUE: Single contrast examination was performed using thin liquid barium. This exam was performed by Soyla Dryer, NP, and was supervised and interpreted by Dr. Annamaria Boots . FLUOROSCOPY: Radiation Exposure Index (as provided by the fluoroscopic device): 2.30 mGy Kerma COMPARISON:  DG swallow 03/15/2022. FINDINGS: Swallowing: Extreme delay in the oropharyngeal phase. Patient required multiple verbal prompts to swallow the barium. No vestibular penetration or aspiration seen. Pharynx: Unremarkable. Esophagus: Normal appearance. Esophageal motility: Within normal limits. Hiatal Hernia: None. Gastroesophageal reflux: None visualized. Ingested 10m barium tablet: Not given Other: None. IMPRESSION: Significantly limited study. Patient lethargic requiring extensive prompting to follow commands. Only one swallow was observed due to patient's limited ability to follow commands and his limited  ability to swallow the barium. Read by: JSoyla Dryer NP Electronically Signed   By: MJerilynn Mages  Shick M.D.   On: 03/22/2022 11:23     Labs:  Basic Metabolic Panel: Recent Labs  Lab 03/29/22 0543  NA 137  K 4.8  CL 107  CO2 22  GLUCOSE 152*  BUN 44*  CREATININE 1.65*  CALCIUM 8.3*    CBC: Recent Labs  Lab 03/29/22 0543 03/29/22 1035  WBC 6.2 7.8  HGB 8.4* 9.3*  HCT 28.1* 30.6*  MCV 78.1* 78.1*  PLT 393 426*    CBG: Recent Labs  Lab 03/29/22 0344 03/29/22 0744 03/29/22 1156 04/01/22 0905 04/02/22 0619  GLUCAP 148* 140* 171* 119* 103*    Brief HPI:   William ANTOSis a 77y.o. male  [facility] he awoke that morning with headache and vision disturbances.  On exam, the patient had a cranial nerve VII palsy and dilated pupil on the right and possible left-sided visual field deficit.  CTA of the head and neck performed.  Discussed with Dr. KLeonel Ramsayand CT scan of the head in the emergency department demonstrated an acute/subacute right occipital stroke.  Out of window for tenecteplase.  Concern for cardioembolic source.     He was admitted to the hospitalist service.  He reports chronic right lower extremity weakness and foot drop.  Echocardiogram revealed EF of 30 to 35%.  This was significantly reduced as compared to previous echocardiogram in September 2023 with an EF of approximately 55 to 60%.  Cardiology was consulted on 12/24.  No acute ischemic changes on EKG.  Carvedilol 3.125 mg started twice daily.  Other goal-directed medical therapy adjusted over the next several days.  He was started on losartan 25 mg daily on 12/25.  He was started on aspirin 325 mg daily along with Plavix 75 mg for 3 months with recommendations to continue aspirin  alone or after.  Symptoms improved although he still complained of generalized weakness but no focal neurologic deficits. Lower extremity venous Doppler  studies were negative for DVT.    On 12/26 he reported a cough and was diagnosed with influenza A.  Treated with Tamiflu.  Failed swallow eval and core track was placed 1/3.  Began tube feeds with protein supplements and continued on Creon for pancreatic insufficiency.  Started on Farxiga 10 mg daily by heart failure team.  Did not require diuretics.  Underwent CT scanning of the chest which revealed bilateral bronchopneumonia. CODE BLUE was called after the patient was found unresponsive and pulseless on around 0445 hrs. on 12/28.  ACLS protocol was underway with ROSC in approximately 2 minutes.  Monitor with ventricular tachycardia.  Placed on amiodarone drip and moved to the ICU with CCM consultation.  Underwent right and left heart cath.  Mild to moderate scattered atherosclerosis.  The left ventricular systolic function is normal.  No hemodynamically significant lesions.  Continue aggressive medical therapy. Started on spirinolactone.  Cardiac MRI performed and consistent with myocarditis and no amyloid involvement.  Electrophysiology consultation obtained.  Noted to have oral thrush on 12/30 and started on nystatin.  Entresto started  1/04 after improvement in serum creatinine. Losartan discontinued.  Barium swallow completed which showed concern for possible proximal esophageal stricture.  Continue dysphagia 2 diet, thin liquids, medications crushed pured.   Palliative care consultation obtained on 1/1.  Palliative care nurse practitioner, [name], discussed and reviewed CODE STATUS on 1/3.  "In the event of cardiac arrest (no pulse), family agrees to DNR status with no CPR, defibrillation, ACLS medications or intubation."   Hospital Course: COOPER MORONEY was admitted to rehab 03/22/2022 for inpatient therapies to consist of PT, ST and OT at least three hours five days a week. Past admission physiatrist, therapy team and rehab RN have worked together to provide customized collaborative  inpatient rehab. Megace increased to 400 mg BID.  Encouraged PO intake in order to be comfortable with removal of Cortrak. Heart failure team saw 1/9. He had non-painful, grossly bloody stool on afternoon of 1/9. A second bloody stool noted 1/10. CBC followed and H and H stable. Had severe orthostatic event with BP of 59/38 morning of 1/13 and cardiology notified. Recs: to stop spironolactone , resume Entresto and Farixga 1/13, trend BP. Entresto stopped 1/14 and FR increased to 1500 cc/day. Still with poor oral intake and TF continued at 60 cc/hr. Seen by Dr. Haroldine Laws on 1/15 and discussed clinical status and goals of care with patient and his wife. Hospice consultation obtained. Plavix held due to GIB. Palliative care consult 01/15 with patient and family decision to proceed with comfort measures only. Medications adjusted. Recreational therapy ordred. [place] liaison continued to follow and communicate with family regarding plac for DC to [facility].  Blood pressures were monitored on TID basis and Entresto 24-26 mg BID, dapagliflozin 10 mg daily, spironolactone 12.5 mg daily, amiodarone 400 mg BID, continued. See above narrative for changes made to medications due to hypotension. Patient refused amiodarone 1/18.  Diabetes has been monitored with ac/hs CBG checks and SSI was use prn for tighter BS control. Novolog 5 units with meals and Semglee 15 units daily continued at admission. Semglee increased to 20 units on 01/10.  Rehab course: During patient's stay in rehab weekly team conferences were held to monitor patient's progress, set goals and discuss barriers to discharge. At admission, patient required mod assist with basic  self-care skills and mobility.  Disposition: Jannifer Rodney Discharge disposition: 03-Skilled Nursing Facility     Diet: dysphagia 2/thin liquids  30-35 minutes were spent on discharge planning and discharge summary. Discharge Instructions     Discharge patient   Complete by: As  directed    Discharge disposition: 03-Skilled Nursing Facility   Discharge patient date: 04/02/2022      Allergies as of 04/02/2022   No Known Allergies      Medication List     STOP taking these medications    aspirin 325 MG tablet   B-12 PO   BD Pen Needle Nano U/F 32G X 4 MM Misc Generic drug: Insulin Pen Needle   clopidogrel 75 MG tablet Commonly known as: PLAVIX   dapagliflozin propanediol 10 MG Tabs tablet Commonly known as: FARXIGA   feeding supplement (JEVITY 1.5 CAL/FIBER) Liqd   feeding supplement (PROSource TF20) liquid   FreeStyle Libre 2 [employer] 3 Sensor Misc   Gemtesa 75 MG Tabs Generic drug: Vibegron   megestrol 400 MG/10ML suspension Commonly known as: MEGACE   NovoLOG FlexPen 100 UNIT/ML FlexPen Generic drug: insulin aspart   nystatin 100000 UNIT/ML suspension Commonly known as: MYCOSTATIN   pantoprazole sodium 40 mg Commonly known as: PROTONIX   rosuvastatin 10 MG tablet Commonly known as: CRESTOR   sacubitril-valsartan 24-26 MG Commonly known as: ENTRESTO   spironolactone 25 MG tablet Commonly known as: ALDACTONE   Testosterone 20.25 MG/ACT (1.62%) Gel Replaced by: testosterone 50 MG/5GM (1%) Gel   Tresiba FlexTouch 100 UNIT/ML FlexTouch Pen Generic drug: insulin degludec       TAKE these medications    acetaminophen 160 MG/5ML solution Commonly known as: TYLENOL Take 20.3 mLs (650 mg total) by mouth every 4 (four) hours as needed for mild pain.   alum & mag hydroxide-simeth 200-200-20 MG/5ML suspension Commonly known as: MAALOX/MYLANTA Take 30 mLs by mouth every 4 (four) hours as needed for indigestion.   amiodarone 400 MG tablet Commonly known as: PACERONE Take 1 tablet (400 mg total) by mouth daily. What changed: See the new instructions.   diclofenac Sodium 1 % Gel Commonly known as: VOLTAREN Apply 2 g topically 4 (four) times daily as needed. What changed:  when to take this reasons  to take this   diphenhydrAMINE 12.5 MG/5ML elixir Commonly known as: BENADRYL Take 5-10 mLs (12.5-25 mg total) by mouth every 6 (six) hours as needed for itching.   guaiFENesin-dextromethorphan 100-10 MG/5ML syrup Commonly known as: ROBITUSSIN DM Take 5-10 mLs by mouth every 6 (six) hours as needed for cough.   insulin glargine-yfgn 100 UNIT/ML injection Commonly known as: SEMGLEE Inject 0.15 mLs (15 Units total) into the skin daily.   lidocaine 5 % Commonly known as: LIDODERM Place 1 patch onto the skin daily as needed. Remove & Discard patch within 12 hours or as directed by MD   lipase/protease/amylase 36000 UNITS Cpep capsule Commonly known as: Creon Take 2 capsules (72,000 Units total) by mouth 3 (three) times daily with meals as needed (Give if patient consumes >25% of any meal, otherwise hold). What changed: See the new instructions.   oxyCODONE 20 MG/ML concentrated solution Commonly known as: ROXICODONE INTENSOL Take 0.5 mLs (10 mg total) by mouth every 2 (two) hours as needed for severe pain (or dyspnea, distress).   oxyCODONE 5 MG/5ML solution Commonly known as: ROXICODONE Take 5 mLs (5 mg total) by mouth every 2 (two) hours as needed for moderate pain.  PHENObarbital 20 MG/5ML elixir Take 5 mLs (20 mg total) by mouth every 6 (six) hours as needed (agitation, anxiety, sleep).   prochlorperazine 10 MG/2ML injection Commonly known as: COMPAZINE Inject 1-2 mLs (5-10 mg total) into the muscle every 6 (six) hours as needed.   prochlorperazine 25 MG suppository Commonly known as: COMPAZINE Place 0.5 suppositories (12.5 mg total) rectally every 6 (six) hours as needed for nausea.   prochlorperazine 5 MG tablet Commonly known as: COMPAZINE Take 1-2 tablets (5-10 mg total) by mouth every 6 (six) hours as needed for nausea.   sodium phosphate 7-19 GM/118ML Enem Place 133 mLs (1 enema total) rectally once as needed for severe constipation.   sorbitol 70 % Soln Take  30 mLs by mouth daily as needed for moderate constipation.   testosterone 50 MG/5GM (1%) Gel Commonly known as: ANDROGEL Place 8 g onto the skin at bedtime. Replaces: Testosterone 20.25 MG/ACT (1.62%) Gel   white petrolatum Oint Commonly known as: VASELINE Apply 1 Application topically as needed for lip care.        Contact information for follow-up providers     Larey Dresser, MD Follow up.   Specialty: Cardiology Contact information: [address] [place] [address] [phone]         Minus Breeding, MD Follow up.   Specialty: Cardiology Contact information: [address] [name] [address] [phone]         [facility] Follow up.   Contact information: [place] [address] [phone]              Contact information for after-discharge care     Destination     [name] .   Service: Skilled Nursing Contact information: [address] [place] [phone]                     Signed: Barbie Banner 04/02/2022, 12:15 PM

## 2022-03-23 NOTE — Progress Notes (Signed)
Patient ID: William Barron, male   DOB: Oct 29, 1945, 77 y.o.   MRN: [id number]  SW initially went by William room to complete assessment and provide updates from team conference, but William sleeping.SW will follow-up with William Barron.   *SW later saw William awake and William Barron in room. SW provided updates from team conference, and d/c date 1/23. SW asked when William son was returning, and she stated she was unsure at this time since he was here for 11 days, and has his own family as well. Will continue to discuss discharge plan. Concerns related to meals William can eat as he has a meal shake,a nd a hot dog from cookout. OT present during this time, and suggested speaking with SLP. SW provided William Barron food list from SLP for appropriate meal options, and informed William can have a chopped up hot dog and no bread.   William Barron, MSW, [place] Office: [phone] Cell: [phone] Fax: [phone]

## 2022-03-24 ENCOUNTER — Other Ambulatory Visit: Payer: Self-pay

## 2022-03-24 DIAGNOSIS — I639 Cerebral infarction, unspecified: Secondary | ICD-10-CM | POA: Diagnosis not present

## 2022-03-24 LAB — GLUCOSE, CAPILLARY
Glucose-Capillary: 321 mg/dL — ABNORMAL HIGH (ref 70–99)
Glucose-Capillary: 368 mg/dL — ABNORMAL HIGH (ref 70–99)
Glucose-Capillary: 124 mg/dL — ABNORMAL HIGH (ref 70–99)
Glucose-Capillary: 160 mg/dL — ABNORMAL HIGH (ref 70–99)

## 2022-03-24 MED ORDER — INSULIN GLARGINE-YFGN 100 UNIT/ML ~~LOC~~ SOLN
20.0000 [IU] | Freq: Every day | SUBCUTANEOUS | Status: DC
  Administered 2022-03-24 – 2022-03-25 (×2): 20 [IU] via SUBCUTANEOUS
  Filled 2022-03-24 (×2): qty 0.2

## 2022-03-24 MED ORDER — INSULIN ASPART 100 UNIT/ML IJ SOLN
0.0000 [IU] | INTRAMUSCULAR | Status: DC
  Administered 2022-03-24 (×2): 1 [IU] via SUBCUTANEOUS
  Administered 2022-03-25: 3 [IU] via SUBCUTANEOUS
  Administered 2022-03-25: 1 [IU] via SUBCUTANEOUS
  Administered 2022-03-25 (×2): 5 [IU] via SUBCUTANEOUS
  Administered 2022-03-26 (×4): 1 [IU] via SUBCUTANEOUS
  Administered 2022-03-26: 2 [IU] via SUBCUTANEOUS
  Administered 2022-03-27: 1 [IU] via SUBCUTANEOUS
  Administered 2022-03-27: 2 [IU] via SUBCUTANEOUS
  Administered 2022-03-27: 1 [IU] via SUBCUTANEOUS
  Administered 2022-03-27: 3 [IU] via SUBCUTANEOUS
  Administered 2022-03-28 (×4): 2 [IU] via SUBCUTANEOUS
  Administered 2022-03-28 – 2022-03-29 (×2): 1 [IU] via SUBCUTANEOUS
  Administered 2022-03-29: 2 [IU] via SUBCUTANEOUS
  Administered 2022-03-29: 1 [IU] via SUBCUTANEOUS
  Administered 2022-03-29: 2 [IU] via SUBCUTANEOUS

## 2022-03-24 NOTE — Progress Notes (Signed)
This patient has been awake the majority of the night,denies pain or discomfort,"just cannot sleep:" Voiding frequently with continent and incontinent episode x1, Repositioned and turned, po liquids 1200cc/day, states he is thirsty consumed po fluids monitor secondary po restrictions. Monitor, repositioned, made as comfortable as possible.

## 2022-03-24 NOTE — Progress Notes (Addendum)
PROGRESS NOTE   Subjective/Complaints: Feels a metallic taste in mouth. Was able to get some sleep. Would like cortrak to come out!  ROS: Patient denies fever, rash, sore throat, blurred vision, dizziness, nausea, vomiting, diarrhea, cough, shortness of breath or chest pain, joint or back/neck pain, headache, or mood change.    Objective:   DG ESOPHAGUS W SINGLE CM (SOL OR THIN BA)  Result Date: 03/22/2022 CLINICAL DATA:  Patient with a history of right occipital CVA with dysphagia. EXAM: ESOPHAGUS/BARIUM SWALLOW/TABLET STUDY TECHNIQUE: Single contrast examination was performed using thin liquid barium. This exam was performed by Soyla Dryer, NP, and was supervised and interpreted by Dr. Annamaria Boots . FLUOROSCOPY: Radiation Exposure Index (as provided by the fluoroscopic device): 2.30 mGy Kerma COMPARISON:  DG swallow 03/15/2022. FINDINGS: Swallowing: Extreme delay in the oropharyngeal phase. Patient required multiple verbal prompts to swallow the barium. No vestibular penetration or aspiration seen. Pharynx: Unremarkable. Esophagus: Normal appearance. Esophageal motility: Within normal limits. Hiatal Hernia: None. Gastroesophageal reflux: None visualized. Ingested 79m barium tablet: Not given Other: None. IMPRESSION: Significantly limited study. Patient lethargic requiring extensive prompting to follow commands. Only one swallow was observed due to patient's limited ability to follow commands and his limited ability to swallow the barium. Read by: JSoyla Dryer NP Electronically Signed   By: MJerilynn Mages  Shick M.D.   On: 03/22/2022 11:23   Recent Labs    03/22/22 1019 03/23/22 0651  WBC 8.3 7.6  HGB 10.1* 9.9*  HCT 33.0* 32.5*  PLT 632* 566*   Recent Labs    03/22/22 1019 03/23/22 0651  NA 139 140  K 4.7 4.4  CL 108 110  CO2 24 21*  GLUCOSE 217* 191*  BUN 33* 34*  CREATININE 1.36* 1.30*  CALCIUM 8.6* 8.5*    Intake/Output Summary  (Last 24 hours) at 03/24/2022 0815 Last data filed at 03/24/2022 0811 Gross per 24 hour  Intake 686 ml  Output 1000 ml  Net -314 ml        Physical Exam: Vital Signs Blood pressure (!) 113/58, pulse 79, temperature 98.1 F (36.7 C), temperature source Oral, resp. rate 18, height '5\' 5"'$  (1.651 m), weight 55 kg, SpO2 95 %.  Constitutional: No distress . Vital signs reviewed. HEENT: NCAT, EOMI, oral membranes moist Neck: supple Cardiovascular: RRR without murmur. No JVD    Respiratory/Chest: CTA Bilaterally without wheezes or rales. Normal effort    GI/Abdomen: BS +, non-tender, non-distended Ext: no clubbing, cyanosis, or edema Psych: pleasant and cooperative  Skin: Clean and intact without signs of breakdown Neuro:  alert, oriented to person, place, reason. Fair awareness. Follows commands. STM deficits. Speech dysarthric. Right CN VI. RUE 4+, LUE 4/5. RLE 4/5 prox with foot drop distally. LLE 4/5. Intentional tremor bilaterally is mild Musculoskeletal: no pain with AROm/PROM    Assessment/Plan: 1. Functional deficits which require 3+ hours per day of interdisciplinary therapy in a comprehensive inpatient rehab setting. Physiatrist is providing close team supervision and 24 hour management of active medical problems listed below. Physiatrist and rehab team continue to assess barriers to discharge/monitor patient progress toward functional and medical goals  Care Tool:  Bathing    Body parts bathed  by patient: Right arm, Left arm, Chest, Abdomen, Front perineal area, Right upper leg, Left upper leg, Face   Body parts bathed by helper: Buttocks, Right lower leg, Left lower leg     Bathing assist       Upper Body Dressing/Undressing Upper body dressing   What is the patient wearing?: Pull over shirt    Upper body assist Assist Level: Minimal Assistance - Patient > 75%    Lower Body Dressing/Undressing Lower body dressing      What is the patient wearing?: Pants      Lower body assist Assist for lower body dressing: Maximal Assistance - Patient 25 - 49%     Toileting Toileting    Toileting assist Assist for toileting: Maximal Assistance - Patient 25 - 49%     Transfers Chair/bed transfer  Transfers assist     Chair/bed transfer assist level: Minimal Assistance - Patient > 75% Chair/bed transfer assistive device: Programmer, multimedia   Ambulation assist      Assist level: Moderate Assistance - Patient 50 - 74% Assistive device: Walker-rolling Max distance: 12 ft   Walk 10 feet activity   Assist     Assist level: Moderate Assistance - Patient - 50 - 74% Assistive device: Walker-rolling   Walk 50 feet activity   Assist Walk 50 feet with 2 turns activity did not occur: Safety/medical concerns         Walk 150 feet activity   Assist Walk 150 feet activity did not occur: Safety/medical concerns         Walk 10 feet on uneven surface  activity   Assist Walk 10 feet on uneven surfaces activity did not occur: Safety/medical concerns         Wheelchair     Assist Is the patient using a wheelchair?: Yes Type of Wheelchair: Manual    Wheelchair assist level: Supervision/Verbal cueing Max wheelchair distance: 150 ft    Wheelchair 50 feet with 2 turns activity    Assist        Assist Level: Supervision/Verbal cueing   Wheelchair 150 feet activity     Assist      Assist Level: Supervision/Verbal cueing   Blood pressure (!) 113/58, pulse 79, temperature 98.1 F (36.7 C), temperature source Oral, resp. rate 18, height '5\' 5"'$  (1.651 m), weight 55 kg, SpO2 95 %.  Medical Problem List and Plan: 1. Functional deficits secondary to right occipital infarct d/t right PCA stenosis/occlusion, multiple medical conditions             -patient may shower             -ELOS/Goals: 14-17 days, supervision to min assist with PT and OT, supervision to mod I with SLP.  --Continue CIR therapies  including PT, OT, and SLP  2.  Antithrombotics: -DVT/anticoagulation:  Pharmaceutical: Heparin             -antiplatelet therapy: Aspirin 325 mg daily plus Plavix 75 mg daily for 3 months then aspirin alone   3. Pain Management: Tylenol, Lidoderm as needed             -Voltaren gel   4. Mood/Behavior/Sleep: LCSW to evaluate and provide emotional support             -antipsychotic agents: n/a   5. Neuropsych/cognition: This patient is capable of making decisions on his own behalf.   6. Skin/Wound Care: Routine skin care checks   7. Fluids/Electrolytes/Nutrition:   -intake  inconsistent yesterday--encouraged pt to pick up po for cortrak removal             -continue tube feeds via Cortrak for now due to malnutrition             -continue Megace --> increased to 400 mg bid             -continue Prosource daily             -Dysphagia 2 diet, thin liquids; advance per SLP recs             -Continue pancreatic enzymes q 6 hours             -continue probiotics             -continue Protonix for GI prophylaxis   8: VT arrest: Likely due to prior MI versus myocarditis status post EP eval             -follow-up with cardiolgy (meds as per #10)             - no plan now for life vest/ICD 9: CAD status post left heart catheterization             -Continue aspirin and statin   10: Acute on chronic systolic heart failure: currently not requiring diuretic             -Daily weight             -Continue Entresto 24-26 mg BID             -Continue dapagliflozin 10 mg daily             -Continue spironolactone 12.5 mg daily             -continue amiodarone 400 mg BID             -continue carvedilol 3.125 mg BID   Filed Weights   03/22/22 1652 03/23/22 0417 03/24/22 0423  Weight: 56.2 kg 54.2 kg 55 kg    11: Cardiomyopathy: Likely due to myocarditis versus prior MI   12: Bilateral pneumonia; likely aspiration pneumonitis             -completed antibiotics             -CT chest 12/30: R>L  bronchopneumonia, chronic sarcoidosis   13: Intraductal papillary mucinous neoplasms of the pancreas status post Whipple procedure 2023             -Continue pancreatic enzymes             -Follow-up with [name]   14: Multiple myeloma with AL amyloidosis: stable; follow-up [name]   15: Influenza A: Status post oseltamivir x 5 days this admission   16: DM: continue CBGs q AC and q HS             -continue SSI             -continue Novolog 5 units with meals             -continue Semglee 15 units daily  CBG (last 3)  Recent Labs    03/23/22 1629 03/23/22 2103 03/24/22 0621  GLUCAP 190* 224* 321*    1/10 increase semglee to 20 units 17: Oral thrush: will not change to diflucan given TP risk, complex CV hx  -continue nystating as rx'ed   18: OAB: continue Myrbetriq   19: Hyperlipidemia: continue Crestor 10 mg   20: Low testosterone: continue  Androgel q HS  -pt receiving now   21: Right foot drop/R common peroneal neuropathy: continue AFO   22: Hard of hearing: wears hearing aids   23: Right parotid gland lesion seen incidentally on MRI of brain             -ENT referral as outpatient   24: Right cranial nerve VI palsy: continue right eye patch   25: Code status: DNR as above    LOS: 2 days A FACE TO FACE EVALUATION WAS PERFORMED  Meredith Staggers 03/24/2022, 8:15 AM

## 2022-03-24 NOTE — Progress Notes (Signed)
Occupational Therapy Session Note  Patient Details  Name: William Barron MRN: [id number] Date of Birth: [birth year]  Today's Date: 03/24/2022 OT Individual Time: 0922-1015 OT Individual Time Calculation (min): 53 min    Short Term Goals: Week 1:  OT Short Term Goal 1 (Week 1): Pt iwll transfer to toilet wiht LRAD and MIN A OT Short Term Goal 2 (Week 1): Pt will locate all needed ADL items with no cuing to demo improved vision/compensatory strategies OT Short Term Goal 3 (Week 1): Pt will complete dressing sit to stand wiht MIN A only for balance  Skilled Therapeutic Interventions/Progress Updates:     Pt received in bed with no pain.  ADL: Pt completes ADL at overall min-mod A Level. Skilled interventions include: EOB sitting for electric shaving with cuing to orient razor, MIN A SPT EOB<>w/c<>shower seat in roll in shower. Pt bathes using lateral leans with CGA to wash buttocks. Pt with teds on in shower d/t slightly low BP, but reporting dizziness improving during shower. Pt dons gown and brief with MOD A and socks with total A d/t fatigue. Will update wife to bring more clothing from home to continue to work on dressing. Pt returns to bed and drinks chocolate shake via straw with no overt s/s of aspiration.   Pt left at end of session in bed with exit alarm on, call light in reach and all needs met   Therapy Documentation Precautions:  Precautions Precautions: Fall, Other (comment) Precaution Comments: vision impaired, feeding tube Required Braces or Orthoses: Other Brace Other Brace: RLE AFO and shoes, pt and son think he does worse with AFO on (per chart) Restrictions Weight Bearing Restrictions: No General:    Therapy/Group: Individual Therapy  ARIF AMENDOLA 03/24/2022, 6:47 AM

## 2022-03-24 NOTE — Progress Notes (Signed)
Physical Therapy Session Note  Patient Details  Name: William Barron MRN: [id number] Date of Birth: [birth year]  Today's Date: 03/24/2022 PT Individual Time: 1532-1629 PT Individual Time Calculation (min): 57 min   Short Term Goals: Week 1:  PT Short Term Goal 1 (Week 1): Patient will perform basic transfers with CGA >50% of the time. PT Short Term Goal 2 (Week 1): Patient will ambulate >25 ft consistently using LRAD. PT Short Term Goal 3 (Week 1): Patient will initiate stair training.  Skilled Therapeutic Interventions/Progress Updates:     Pt received supine in bed with wife present. Pt reports fatigue and "long day" but is agreeable to therapy. No complaint of pain. Pt performs supine to sit with modA/maxA and cues for logrolling and sequencing. Pt noted to have soiled bed linens with urine. Pt performs sit to stand to RW with modA and cues for hand placement and sequencing. Pt ambulates to toilet, x20', with modA and stooped posture with flexed knees and hips bilaterally. Pt transfers onto toilet with use of grab bars and modA, with cues for sequencing and positioning. Pt is continent of bowel on toilet and noted to have large, bloody stool. RN notified. Pt requires modA to stand and PT provides totalA for pericare, noting frank blood on washcloth. Stand pivot to WC with RW and modA, with cues for hip positioning and body mechanics. WC transport to gym. Pt performs stand pivot to Nustep with modA/maxA and no AD, with cues for anterior weigh shift, body mechanics, sequencing, and positioning. Pt notably short of breath following transfer. Vitals assessed and pt's O2 is at 100% and HR ~90. Pt completes x2:30 on Nustep at workload of 4 with average steps per minute ~30. Pt stops due to fatigue. Stand pivot from Nustep>WC>Bed with modA. Sit to supine with cues for positioning. Pt left semi-reclined with alarm intact and all needs within reach.  Therapy Documentation Precautions:   Precautions Precautions: Fall, Other (comment) Precaution Comments: vision impaired, feeding tube Required Braces or Orthoses: Other Brace Other Brace: RLE AFO and shoes, pt and son think he does worse with AFO on (per chart) Restrictions Weight Bearing Restrictions: No    Therapy/Group: Individual Therapy  Breck Coons, PT, DPT 03/24/2022, 4:59 PM

## 2022-03-24 NOTE — Progress Notes (Signed)
Met with patient. Resting. Oriented to rehab and team conference every Tuesday. Removed loop recorder and life vest information since patient didn't get prior to admission. Patient reports that checks blood sugar at home and takes insulin at home. He doesn't do it himself, he reports that wife does for him.  He also reports that gets daily weight at home. Informed of chart to write down weight for follow up appts in form in binder. Aware of stroke but not able to tell me what type of stroke he had. Educated him on type of stroke and that taking ASA and plavix for now and MD will decide for how long and what medications will be continued and what will be stopped. Also discussed that was positive for Flu and was treated. Also was started on ABTs for possible lung infection. Discussed diet and that is on 1200 FR.  Informed that can have 300cc which is one cup without ice with each meal and one cup between 2100 and 0600. Informed that nursing should be keeping track of fluid intake. Also discussed that all medications should be given crushed and should not be taking any medications whole at this time. Patient with poor po intake. Didn't discussed diet modifications due to LDL 66, potassium 3.3, A1C 6.7.  Diet modifications on education board in room to discuss. Patient closing his eyes and reports that exhausted. All needs met, call bell in reach.

## 2022-03-24 NOTE — Care Management (Signed)
[facility] Individual Statement of Services  Patient Name:  William Barron  Date:  03/24/2022  Welcome to the [facility].  Our goal is to provide you with an individualized program based on your diagnosis and situation, designed to meet your specific needs.  With this comprehensive rehabilitation program, you will be expected to participate in at least 3 hours of rehabilitation therapies Monday-Friday, with modified therapy programming on the weekends.  Your rehabilitation program will include the following services:  Physical Therapy (PT), Occupational Therapy (OT), Speech Therapy (ST), 24 hour per day rehabilitation nursing, Therapeutic Recreaction (TR), Psychology, Neuropsychology, Care Coordinator, Rehabilitation Medicine, [place], and Other  Weekly team conferences will be held on Tuesdays to discuss your progress.  Your Inpatient Rehabilitation Care Coordinator will talk with you frequently to get your input and to update you on team discussions.  Team conferences with you and your family in attendance may also be held.  Expected length of stay: 2 weeks    Overall anticipated outcome: Supervision  Depending on your progress and recovery, your program may change. Your Inpatient Rehabilitation Care Coordinator will coordinate services and will keep you informed of any changes. Your Inpatient Rehabilitation Care Coordinator's name and contact numbers are listed  below.  The following services may also be recommended but are not provided by the [facility] will be made to provide these services after discharge if needed.  Arrangements include referral to agencies that provide these services.  Your insurance has been verified to be:  Engineer, maintenance primary doctor is:  Scientist, product/process development  Pertinent information will be shared with your doctor and your insurance company.  Inpatient Rehabilitation Care Coordinator:  Cathleen Corti [phone] or ([phone]  Information discussed with and copy given to patient by: Rana Snare, 03/24/2022, 2:51 PM

## 2022-03-24 NOTE — Progress Notes (Signed)
Occupational Therapy Session Note  Patient Details  Name: William Barron MRN: [id number] Date of Birth: Jun 06, 1945  Today's Date: 03/24/2022 OT Individual Time: 1287-8676 OT Individual Time Calculation (min): 9 min  and Today's Date: 03/24/2022 OT Missed Time: 21 Minutes Missed Time Reason: Patient fatigue   Short Term Goals: Week 1:  OT Short Term Goal 1 (Week 1): Pt iwll transfer to toilet wiht LRAD and MIN A OT Short Term Goal 2 (Week 1): Pt will locate all needed ADL items with no cuing to demo improved vision/compensatory strategies OT Short Term Goal 3 (Week 1): Pt will complete dressing sit to stand wiht MIN A only for balance  Skilled Therapeutic Interventions/Progress Updates:    Pt received in bed on RA satting at 98%. Pt reporting he is exhausted. Educated pt on POC and missed time as well as participation in tx to improve cardiopulmonary endurance. Pt verbalized understanding. Pt reporting too exhausted. The shower "felt amazing" but took a lot of energy out of pt. Recommend that showers be completed in afternoon for energy conservation. Pt agreeable. Pt requesting information on why he had a stroke despite RN providing information earlier. Educated on types of strokes and to discuss with MD about risk factors for stroke and how to prevent another stroke. No carryover from previous conversations evident. Exited session with pt seated in bed, exit alarm on and call light in reach Pt missed 21 min skilled OT d/t fatigue.  Therapy Documentation Precautions:  Precautions Precautions: Fall, Other (comment) Precaution Comments: vision impaired, feeding tube Required Braces or Orthoses: Other Brace Other Brace: RLE AFO and shoes, pt and son think he does worse with AFO on (per chart) Restrictions Weight Bearing Restrictions: No General: General OT Amount of Missed Time: 21 Minutes   Therapy/Group: Individual Therapy  [name] 03/24/2022, 2:27 PM

## 2022-03-24 NOTE — Progress Notes (Signed)
Speech Language Pathology Daily Session Note  Patient Details  Name: William Barron MRN: [id number] Date of Birth: 1946/01/15  Today's Date: 03/24/2022 SLP Individual Time: 0820-0915 SLP Individual Time Calculation (min): 55 min  Short Term Goals: Week 1: SLP Short Term Goal 1 (Week 1): Patient will consume current diet with minimal overt s/s of aspiration with Min verbal cues for use of swallowing compensatory strategies. SLP Short Term Goal 2 (Week 1): Patient will demonstrate efficient mastication and complete oral clearance with trials of Dys. 3 textures without reports of a globus sensation over 2 sessions prior to upgrade. SLP Short Term Goal 3 (Week 1): Patient will demonstrate functional problem solving for basic and familiar tasks with Min verbal cues. SLP Short Term Goal 4 (Week 1): Patient will recall new, daily information with Mod verbal and visual cues for use of memory compensatory strategies. SLP Short Term Goal 5 (Week 1): Patient will self-monitor and correct errors duirng functional tasks with Min verbal cues. SLP Short Term Goal 6 (Week 1): Patient will demonstrate sustained attention to functional tasks for 15 minutes with Min verbal cues for redirection.  Skilled Therapeutic Interventions: Skilled treatment session focused on cognitive and dysphagia goals. Upon arrival, patient was awake while upright in bed and requesting "banana yogurt like you brought me yesterday." SLP provided vanilla yogurt and educated patient that it was the same yogurt as yesterday. Patient continued to request banana yogurt but eventually ate 25% of the vanilla yogurt. Patient self-fed without overt s/s of aspiration noted. SLP administered the [facility] Mental Status Examination (SLUMS). Patient scored  25/30 points with a score of 27 or above considered normal. Patient demonstrated deficits in problem solving and recall. Patient requesting assistance with the remote to operate the  television successfully. Patient initially required Max verbal cues for problem solving due to visual deficits which faded to supervision once visual aids were provided (wrote the letter "V" near the arrows for volume). Patient left upright in bed with alarm on and all needs within reach. Continue with current plan of care.      Pain Intermittent pain in sacrum when sitting upright in bed   Therapy/Group: Individual Therapy  William Barron 03/24/2022, 1:45 PM

## 2022-03-24 NOTE — Progress Notes (Incomplete)
Alert with noticed confusion oriented to person and place, reoriented to time and situation.Jevity continue at 60 hr and 20cc flush as order,Made as comfortable as.Marland Kitchen

## 2022-03-25 ENCOUNTER — Ambulatory Visit: Payer: Medicare Other | Admitting: Internal Medicine

## 2022-03-25 DIAGNOSIS — E119 Type 2 diabetes mellitus without complications: Secondary | ICD-10-CM | POA: Diagnosis not present

## 2022-03-25 DIAGNOSIS — I639 Cerebral infarction, unspecified: Secondary | ICD-10-CM | POA: Diagnosis not present

## 2022-03-25 DIAGNOSIS — I5023 Acute on chronic systolic (congestive) heart failure: Secondary | ICD-10-CM | POA: Diagnosis not present

## 2022-03-25 DIAGNOSIS — B370 Candidal stomatitis: Secondary | ICD-10-CM | POA: Diagnosis not present

## 2022-03-25 LAB — CBC
HCT: 34.5 % — ABNORMAL LOW (ref 39.0–52.0)
Hemoglobin: 10.5 g/dL — ABNORMAL LOW (ref 13.0–17.0)
MCH: 23.6 pg — ABNORMAL LOW (ref 26.0–34.0)
MCHC: 30.4 g/dL (ref 30.0–36.0)
MCV: 77.7 fL — ABNORMAL LOW (ref 80.0–100.0)
Platelets: 576 10*3/uL — ABNORMAL HIGH (ref 150–400)
RBC: 4.44 MIL/uL (ref 4.22–5.81)
RDW: 18.8 % — ABNORMAL HIGH (ref 11.5–15.5)
WBC: 8.8 10*3/uL (ref 4.0–10.5)
nRBC: 0 % (ref 0.0–0.2)

## 2022-03-25 LAB — GLUCOSE, CAPILLARY
Glucose-Capillary: 131 mg/dL — ABNORMAL HIGH (ref 70–99)
Glucose-Capillary: 231 mg/dL — ABNORMAL HIGH (ref 70–99)
Glucose-Capillary: 232 mg/dL — ABNORMAL HIGH (ref 70–99)
Glucose-Capillary: 240 mg/dL — ABNORMAL HIGH (ref 70–99)
Glucose-Capillary: 252 mg/dL — ABNORMAL HIGH (ref 70–99)
Glucose-Capillary: 295 mg/dL — ABNORMAL HIGH (ref 70–99)

## 2022-03-25 MED ORDER — INSULIN GLARGINE-YFGN 100 UNIT/ML ~~LOC~~ SOLN
15.0000 [IU] | Freq: Every day | SUBCUTANEOUS | Status: DC
  Administered 2022-03-26 – 2022-04-02 (×8): 15 [IU] via SUBCUTANEOUS
  Filled 2022-03-25 (×8): qty 0.15

## 2022-03-25 MED ORDER — JEVITY 1.5 CAL/FIBER PO LIQD
1000.0000 mL | ORAL | Status: DC
  Administered 2022-03-25 – 2022-03-28 (×4): 1000 mL
  Filled 2022-03-25 (×3): qty 1000

## 2022-03-25 MED ORDER — TESTOSTERONE 50 MG/5GM (1%) TD GEL
8.0000 g | Freq: Every day | TRANSDERMAL | Status: DC
  Administered 2022-03-25 – 2022-03-28 (×4): 8 g via TRANSDERMAL
  Filled 2022-03-25 (×4): qty 10
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
## 2022-03-25 NOTE — Progress Notes (Signed)
Physical Therapy Session Note  Patient Details  Name: William Barron MRN: [id number] Date of Birth: [birth year]  Today's Date: 03/25/2022 PT Individual Time: 1100-1200 PT Individual Time Calculation (min): 60 min   Short Term Goals: Week 1:  PT Short Term Goal 1 (Week 1): Patient will perform basic transfers with CGA >50% of the time. PT Short Term Goal 2 (Week 1): Patient will ambulate >25 ft consistently using LRAD. PT Short Term Goal 3 (Week 1): Patient will initiate stair training.  Skilled Therapeutic Interventions/Progress Updates:     Patient in bed asleep with his wife at bedside upon PT arrival. Patient alert and agreeable to PT session. Patient displayed signs of increased pain with BM during session with frank blood in toilet and on wash cloth during peri-care, RN made aware. PT provided repositioning, rest breaks, and distraction as pain interventions throughout session.   Patient's wife expressed concerns about patient's orientation, day time sleeping, functional decline during his stay, and burden of care at d/c. Reports she will not be able to care for him at this level at home and requesting exploring options for SNF. Educated on patient's CLOF, goals and POC with hopes to reduce the burden of care during his stay. Patient oriented x4, disoriented to time of day (afternoon vs morning), unsure of sleep quality at night. Requested sleep chart to determine if patient is getting his days/nights confused. Patient's wife left following session. Updated CSW at end of session on discussion and patient expressed his desire to go home, but open to SNF placement.   Patient with SOB and fatigue with all mobility this session, vitals WFL, see below. Patient required increased time for initiation, cuing, rest breaks, and for completion of tasks throughout session. Utilized therapeutic use of self throughout to promote efficiency.   Vitals: Sitting: BP 100/59, HR 83, SPO2 100% After  transfer: BP 108/83, HR 87, SPO2 98%  Therapeutic Activity: Bed Mobility: Patient performed supine to/from sit x2 with supervision and use of hospital bed features. Provided verbal cues for bring his legs off the bed before sitting up for efficiency of mobility. Transfers: Patient performed stand pivot bed<>w/c and w/c<>toilet using arm rests or grab bars with min-mod A. Provided verbal cues for scooting forward, hand placement, hip/knee/trunk extension in standing, and paced breathing due to SOB. He performed sit to/from stand x1 with CGA using RW. Provided verbal cues for hand placement, forward weight shift, hip/knee/trunk extension, and reaching back to control descent.  Patient was continent of bladder during toileting, unsuccessful with BM, however, he did have bright red blood, as noted above. Performed peri-care and lower body clothing management performed with total A.   Gait Training:  Patient ambulated 16 feet using RW with min progressing to mod A. Ambulated with crouched posture, shuffling gait, and downward head gait.   Patient in bed with CSW in the room at end of session with breaks locked, bed alarm set, and all needs within reach.   Therapy Documentation Precautions:  Precautions Precautions: Fall, Other (comment) Precaution Comments: vision impaired, feeding tube Required Braces or Orthoses: Other Brace Other Brace: RLE AFO and shoes, pt and son think he does worse with AFO on (per chart) Restrictions Weight Bearing Restrictions: No    Therapy/Group: Individual Therapy  Bazil Dhanani L Layne Dilauro PT, DPT, NCS, CBIS  03/25/2022, 12:53 PM

## 2022-03-25 NOTE — Progress Notes (Signed)
Occupational Therapy Session Note  Patient Details  Name: William Barron MRN: [id number] Date of Birth: 08/13/1945  Today's Date: 03/25/2022 OT Individual Time: 0705-0800 OT Individual Time Calculation (min): 55 min    Short Term Goals: Week 1:  OT Short Term Goal 1 (Week 1): Pt iwll transfer to toilet wiht LRAD and MIN A OT Short Term Goal 2 (Week 1): Pt will locate all needed ADL items with no cuing to demo improved vision/compensatory strategies OT Short Term Goal 3 (Week 1): Pt will complete dressing sit to stand wiht MIN A only for balance  Skilled Therapeutic Interventions/Progress Updates:     Pt received in bed with no pain but incontinent of urine.  ADL: Pt completes ADL at overall min-mod A Level. Skilled interventions include:  Sup>sit with MIN A for trunk elevation/cuing for hand placement SPT with MOD A using RW with slouched posture Grooming at sink with cuing for visual scanning to L for needed items with glasses on to improve vision Increased time provided for processing cuing throughout BADL with items placed on B sides for improved visual scanning UB dressing completed with CGA LB dressing completed with total A for teds/socks, MOD A for pants. P tneeds sit break after OT advances brief past hips d/t poor activity tolerance. Pt provided with built up weighted utensils to trial with SLP   Pt left at end of session in w/c with exit alarm on, call light in reach and all needs met awaiting SLP arrival.   Therapy Documentation Precautions:  Precautions Precautions: Fall, Other (comment) Precaution Comments: vision impaired, feeding tube Required Braces or Orthoses: Other Brace Other Brace: RLE AFO and shoes, pt and son think he does worse with AFO on (per chart) Restrictions Weight Bearing Restrictions: No  Therapy/Group: Individual Therapy  AMILIO ZEHNDER 03/25/2022, 6:50 AM

## 2022-03-25 NOTE — Progress Notes (Signed)
Inpatient Rehabilitation Care Coordinator Assessment and Plan Patient Details  Name: William Barron MRN: [id number] Date of Birth: [birth year]  Today's Date: 03/25/2022  Hospital Problems: Principal Problem:   Acute CVA (cerebrovascular accident) [facility])  Past Medical History:  Past Medical History:  Diagnosis Date   Amyloidosis ([place])    Diabetes mellitus without complication ([place])    Feeding tube dysfunction, initial encounter 08/12/2021   GERD (gastroesophageal reflux disease)    Hearing loss    Has hearing aids   History of kidney stones    Hyperlipidemia    Jejunostomy tube present ([place])    Multiple myeloma ([place])    and amylodosis    Nephrolithiasis    Occasional tremors    Pancreatic insufficiency    Pneumonia    Prostate cancer ([place])    Prostatitis    acute and chronic   Skin cancer    Past Surgical History:  Past Surgical History:  Procedure Laterality Date   APPENDECTOMY     CARDIAC CATHETERIZATION N/A 06/13/2015   Procedure: Left Heart Cath and Coronary Angiography;  Surgeon: Jettie Booze, MD;  Location: [place] [facility];  Service: Cardiovascular;  Laterality: N/A;   COLONOSCOPY  04/07/2000   ELBOW SURGERY  03/15/2001   right   ESOPHAGOGASTRODUODENOSCOPY (EGD) WITH PROPOFOL N/A 09/07/2021   Procedure: ESOPHAGOGASTRODUODENOSCOPY (EGD) WITH PROPOFOL;  Surgeon: Irene Shipper, MD;  Location: [place];  Service: Gastroenterology;  Laterality: N/A;   EXTRACORPOREAL SHOCK WAVE LITHOTRIPSY Left 07/17/2018   Procedure: EXTRACORPOREAL SHOCK WAVE LITHOTRIPSY (ESWL);  Surgeon: [name], MD;  Location: [facility];  Service: Urology;  Laterality: Left;   EXTRACORPOREAL SHOCK WAVE LITHOTRIPSY Right 12/08/2020   Procedure: RIGHT EXTRACORPOREAL SHOCK WAVE LITHOTRIPSY (ESWL);  Surgeon: Raynelle Bring, MD;  Location: [facility];  Service: Urology;  Laterality: Right;   FOOT ARTHRODESIS Right 04/15/2021   Procedure: RIGHT SUBTALAR AND TALONAVICULAR  FUSION;  Surgeon: Newt Minion, MD;  Location: [place];  Service: Orthopedics;  Laterality: Right;   HERNIA REPAIR     IR MECH REMOV OBSTRUC MAT ANY COLON TUBE W/FLUORO  08/27/2021   IR REPLC GASTRO/COLONIC TUBE PERCUT W/FLUORO  09/08/2021   RIGHT/LEFT HEART CATH AND CORONARY ANGIOGRAPHY N/A 03/11/2022   Procedure: RIGHT/LEFT HEART CATH AND CORONARY ANGIOGRAPHY;  Surgeon: Jettie Booze, MD;  Location: [facility];  Service: Cardiovascular;  Laterality: N/A;   ROTATOR CUFF REPAIR     WHIPPLE PROCEDURE  07/13/2021   Social History:  reports that he quit smoking about 45 years ago. His smoking use included cigarettes. He has a 60.00 pack-year smoking history. He has never used smokeless tobacco. He reports current alcohol use of about 3.0 standard drinks of alcohol per week. He reports that he does not use drugs.  Family / Support Systems Marital Status: Married How Long?: 40 years Patient Roles: Spouse, Parent Spouse/Significant Other: [name] (wife) Children: Two children; 1 living son that lives in [place] Other Supports: aide care through [place] (11hrs per week) Anticipated Caregiver: Wife Ability/Limitations of Caregiver: Wife is not physically able to provide assistance Caregiver Availability: 24/7 Family Dynamics: Pt lives with his wife and gets 11hrs per week of aide care provided through the [place].  Social History Preferred language: English Religion: Catholic Cultural Background: He worked as a TEFL teacher until 2016 Education: [place] - How often do you need to have someone help you when you read instructions, pamphlets, or other written material from your doctor or pharmacy?: Never  Writes: Yes Employment Status: Retired Date Retired/Disabled/Unemployed: 2016 Public relations account executive Issues: Denies Guardian/Conservator: N/A   Abuse/Neglect Abuse/Neglect Assessment Can Be Completed: Yes Physical Abuse: Denies Verbal Abuse: Denies Sexual Abuse:  Denies Exploitation of patient/patient's resources: Denies Self-Neglect: Denies  Patient response to: Social Isolation - How often do you feel lonely or isolated from those around you?: Never  Emotional Status Pt's affect, behavior and adjustment status: Pt in some discomfort at time of visit as his PT session was ending and he was SOB. He is HOH during encounter. He does not want to go short term SNF rehab, and woud like to go home. IF he has to go somewhere, he states he woud like to go "somewhere nice." He later says he wants to go home. Recent Psychosocial Issues: Denies Psychiatric History: denies Substance Abuse History: Pt admits he quit smoking cigarettes 45 years ago due to health concerns. He admits to drinking a beer daily. Denies rec drug use.  Patient / Family Perceptions, Expectations & Goals Pt/Family understanding of illness & functional limitations: Pt and family have a general understanding of  care needs Premorbid pt/family roles/activities: Independent Anticipated changes in roles/activities/participation: Assistance with ADLs/IADLs Pt/family expectations/goals: Pt goal is to "be in as good condition as when I came here."  [employer]: None Premorbid Home Care/DME Agencies: None Transportation available at discharge: TBD Is the patient able to respond to transportation needs?: Yes In the past 12 months, has lack of transportation kept you from medical appointments or from getting medications?: No In the past 12 months, has lack of transportation kept you from meetings, work, or from getting things needed for daily living?: No Resource referrals recommended: Neuropsychology  Discharge Planning Living Arrangements: Spouse/significant other Support Systems: Spouse/significant other, Home care staff Type of Residence: Private residence Insurance Resources: Multimedia programmer (specify) (VA and Medicare A/B) Financial Resources: Social  Security Financial Screen Referred: No Living Expenses: Own Money Management: Spouse Does the patient have any problems obtaining your medications?: No Home Management: Pt and spouse manage home care needs. 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 HCPOA-wife. DME: uses stair lift to get to second floor since all bedrooms upstairs; shower chair; RW (uses sometimes).   Kenyette Gundy A Aubriella Perezgarcia 03/25/2022, 3:47 PM

## 2022-03-25 NOTE — Progress Notes (Signed)
Occupational Therapy Session Note  Patient Details  Name: William Barron MRN: [id number] Date of Birth: 1945-09-29  Today's Date: 03/25/2022 OT Individual Time: 1445-1533 OT Individual Time Calculation (min): 48 min    Short Term Goals: Week 1:  OT Short Term Goal 1 (Week 1): Pt iwll transfer to toilet wiht LRAD and MIN A OT Short Term Goal 2 (Week 1): Pt will locate all needed ADL items with no cuing to demo improved vision/compensatory strategies OT Short Term Goal 3 (Week 1): Pt will complete dressing sit to stand wiht MIN A only for balance  Skilled Therapeutic Interventions/Progress Updates:  Pt asleep hospital bed level upon OT arrival. Pt easily awakened and requesting a drink. OT appreciated signage and pt able to take a few sips once HOB fully upright. Nursing reports pt was close to fluid restriction ceiling already for today thus OT suggested oral care EOB level. Pt moved with hospital bed features and rails to EOB with CGA and required facilitation for upright posture and more neutral pelvis in sitting. Pt required set up for suction toothbrush use. Pt requested electric razor shaving  as he reported "I may have a few friends stop by". OT positioned mirror on table close in to ensure pt able to visualize [place].  Had R lens taping in place with no reports of diplopia throughout session with items brought for use in all planes to B oculomotor control, encourage head turns as well as convergence. Pt required 2 rest breaks with shaving task due to limitations in sitting activity tolerance and OT suggested resting briefly with posterior pillows in front of rail for propping support. Pt with excellent attention to this task as pt particular re: outcome stating "I need it to look clean". OT completed session with pt performing B scap retraction and elbow flex and ext 60 reps Bly with 12 oz Boost bottle and rec this task as a safe alternative to weights between visits. Pt reports he  performed 90 reps at home Bly with 5 lb weights perior to illness and enjoys exercises. Pt returned to supine at end of session and left pt with no reports of pain, raisl and HOB elevated, exit alarm engaged and nurse call button and needs in reach.   Therapy Documentation Precautions:  Precautions Precautions: Fall, Other (comment) Precaution Comments: vision impaired, feeding tube Required Braces or Orthoses: Other Brace Other Brace: RLE AFO and shoes, pt and son think he does worse with AFO on (per chart) Restrictions Weight Bearing Restrictions: No   Therapy/Group: Individual Therapy  Barnabas Lister 03/25/2022, 7:39 AM

## 2022-03-25 NOTE — IPOC Note (Signed)
Overall Plan of Care [facility]) Patient Details Name: William Barron MRN: [id number] DOB: 1946-02-10  Admitting Diagnosis: Acute CVA (cerebrovascular accident) [facility]), debility  Hospital Problems: Principal Problem:   Acute CVA (cerebrovascular accident) [facility])     Functional Problem List: Nursing Medication Management, Bladder, Nutrition, Safety, Endurance  PT Balance, Perception, Safety, Behavior, Sensory, Edema, Endurance, Skin Integrity, Motor, Nutrition, Pain  OT Balance, Cognition, Endurance, Pain, Perception, Vision  SLP Cognition, Nutrition  TR         Basic ADL's: OT Grooming, Bathing, Dressing, Toileting     Advanced  ADL's: OT       Transfers: PT Bed Mobility, Bed to Chair, Car, Manufacturing systems engineer, Metallurgist: PT Ambulation, Emergency planning/management officer, Stairs     Additional Impairments: OT    SLP Swallowing, Social Cognition   Problem Solving, Memory, Attention, Awareness  TR      Anticipated Outcomes Item Anticipated Outcome  Self Feeding S  Swallowing  Mod I   Basic self-care  S  Toileting  S   Bathroom Transfers S  Bowel/Bladder  manage bladder w toileting/ mod I assist  Transfers  Supervision using LRAD  Locomotion  CGA 50 feet using LRAD  Communication     Cognition  Supervision-Min A  Pain  < 4 with prns  Safety/Judgment  manage w cues   Therapy Plan: PT Intensity: Minimum of 1-2 x/day ,45 to 90 minutes PT Frequency: 5 out of 7 days PT Duration Estimated Length of Stay: 2 weeks OT Intensity: Minimum of 1-2 x/day, 45 to 90 minutes OT Frequency: 5 out of 7 days OT Duration/Estimated Length of Stay: 2 weeks SLP Intensity: Minumum of 1-2 x/day, 30 to 90 minutes SLP Frequency: 3 to 5 out of 7 days SLP Duration/Estimated Length of Stay: 04/06/22   Team Interventions: Nursing Interventions Patient/Family Education, Bladder Management, Disease Management/Prevention, Medication Management, Discharge Planning,  Dysphagia/Aspiration Precaution Training  PT interventions Ambulation/gait training, Community reintegration, Neuromuscular re-education, DME/adaptive equipment instruction, Psychosocial support, Stair training, UE/LE Strength taining/ROM, Wheelchair propulsion/positioning, UE/LE Coordination activities, Therapeutic Activities, Skin care/wound management, Pain management, Functional electrical stimulation, Discharge planning, Training and development officer, Cognitive remediation/compensation, Functional mobility training, Disease management/prevention, Patient/family education, Splinting/orthotics, Therapeutic Exercise, Visual/perceptual remediation/compensation  OT Interventions Balance/vestibular training, Discharge planning, Pain management, Self Care/advanced ADL retraining, Therapeutic Activities, UE/LE Coordination activities, Visual/perceptual remediation/compensation, Therapeutic Exercise, Skin care/wound managment, Patient/family education, Functional mobility training, Disease mangement/prevention, Cognitive remediation/compensation, Community reintegration, Engineer, drilling, Neuromuscular re-education, Psychosocial support, Splinting/orthotics, UE/LE Strength taining/ROM, Wheelchair propulsion/positioning  SLP Interventions Cognitive remediation/compensation, Dysphagia/aspiration precaution training, Internal/external aids, Therapeutic Activities, Environmental controls, Cueing hierarchy, Functional tasks, Patient/family education  TR Interventions    SW/CM Interventions Discharge Planning, Psychosocial Support, Patient/Family Education   Barriers to Discharge MD  Medical stability  Nursing Decreased caregiver support, Home environment access/layout, Nutrition means 2 level 4 ste 1/2 ba on main chair lift to second floor,Wife able to provide no more than intermittent supervision. Son coming to live with pt. and care for him after CIR  PT Home environment access/layout, Behavior,  Nutrition means, New oxygen    OT Decreased caregiver support, Lack of/limited family support wife unable to provide physcial assist  SLP      SW       Team Discharge Planning: Destination: PT-Home ,OT- Home , SLP-Home Projected Follow-up: PT-Home health PT, OT-  Home health OT, SLP-24 hour supervision/assistance, Outpatient SLP, Home Health SLP Projected Equipment Needs: PT-Rolling walker with 5" wheels, Wheelchair cushion (measurements), Wheelchair (  measurements), OT- None recommended by OT, SLP-To be determined Equipment Details: PT-16"x16" wheelchair, OT-has shower chair and BSC from previous CIR stay Patient/family involved in discharge planning: PT- Patient,  OT-Patient, SLP-Patient  MD ELOS: 14-16 days Medical Rehab Prognosis:  Good Assessment: The patient has been admitted for CIR therapies with the diagnosis of CVA, debility. The team will be addressing functional mobility, strength, stamina, balance, safety, adaptive techniques and equipment, self-care, bowel and bladder mgt, patient and caregiver education, NMR, swallowing, cognition, nutrition, community reentry. Goals have been set at supervision for self-care and mobility, sup to min with swallowing and cognition. Anticipated discharge destination is home with family.        See Team Conference Notes for weekly updates to the plan of care

## 2022-03-25 NOTE — Progress Notes (Signed)
Speech Language Pathology Daily Session Note  Patient Details  Name: William Barron MRN: [id number] Date of Birth: 1946-01-19  Today's Date: 03/25/2022 SLP Individual Time: 7858-8502 SLP Individual Time Calculation (min): 43 min  Short Term Goals: Week 1: SLP Short Term Goal 1 (Week 1): Patient will consume current diet with minimal overt s/s of aspiration with Min verbal cues for use of swallowing compensatory strategies. SLP Short Term Goal 2 (Week 1): Patient will demonstrate efficient mastication and complete oral clearance with trials of Dys. 3 textures without reports of a globus sensation over 2 sessions prior to upgrade. SLP Short Term Goal 3 (Week 1): Patient will demonstrate functional problem solving for basic and familiar tasks with Min verbal cues. SLP Short Term Goal 4 (Week 1): Patient will recall new, daily information with Mod verbal and visual cues for use of memory compensatory strategies. SLP Short Term Goal 5 (Week 1): Patient will self-monitor and correct errors duirng functional tasks with Min verbal cues. SLP Short Term Goal 6 (Week 1): Patient will demonstrate sustained attention to functional tasks for 15 minutes with Min verbal cues for redirection.  Skilled Therapeutic Interventions: Skilled treatment session focused on dysphagia and cognitive goals. Upon arrival, patient was awake while upright in the wheelchair. Patient consumed his breakfast meal of Dys. 2 textures with thin liquids without overt s/s of aspiration with patient independently utilizing multiple swallows and liquid washes for clearance. Recommend patient continue current diet. Patient continues to demonstrate decreased PO intake despite Max encouragement and a variety of options, however, patient continues to request ice cream throughout all times of the day. PO intake also appears impacted by poor endurance and low frustration tolerance when attempting to self-feed around the NG tube. Patient  demonstrated selective attention to self-feeding for ~30 minutes with Min verbal cues for redirection. Patient left upright in wheelchair with alarm on and all needs within reach. Continue with current plan of care.   Pain Intermittent pain in nose due to NG tube   Therapy/Group: Individual Therapy  Yaslyn Cumby 03/25/2022, 3:16 PM

## 2022-03-25 NOTE — Progress Notes (Signed)
PT reported bloody stool during session. Also report of pain during BM. Dr. Naaman Plummer made aware. Labs ordered. No other occurrence since.   Gerald Stabs, RN

## 2022-03-25 NOTE — NC FL2 (Signed)
[place] LEVEL OF CARE FORM     IDENTIFICATION  Patient Name: William Barron Birthdate: Jun 23, 1945 Sex: male Admission Date (Current Location): 03/22/2022  [facility] and [place] Number:      Facility and Address:  [facility] [place], [place] [address] [place], [place] [address]      Provider Number: [id number]  Attending Physician Name and Address:  Meredith Staggers, MD  Relative Name and Phone Number:  Cherokee Clowers [phone]    Current Level of Care: Hospital Recommended Level of Care: [place] Prior Approval Number:    Date Approved/Denied:   PASRR Number: [id number]  Discharge Plan: SNF    Current Diagnoses: Patient Active Problem List   Diagnosis Date Noted   Acute CVA (cerebrovascular accident) ([place]) 03/22/2022   Cardiac arrest ([place]) 51/76/1607   Acute systolic CHF (congestive heart failure) ([place]) 03/08/2022   Influenza A with pneumonia 03/08/2022   Occipital stroke ([place]) 03/05/2022   Leg swelling 01/20/2022   Nonrheumatic aortic valve stenosis 01/20/2022   Abnormal CT of the abdomen    Pressure injury of skin 09/05/2021   Goals of care, counseling/discussion 09/03/2021   Postoperative anemia due to acute blood loss 08/31/2021   Reactive thrombocytosis 08/31/2021   Malnutrition of moderate degree 08/19/2021   Debility 08/19/2021   Asymptomatic bacteriuria 08/15/2021   H/O Whipple procedure 08/13/2021   Chronic hiccups 08/13/2021   Candida esophagitis ([place]) 08/13/2021   Insulin-requiring or dependent type II diabetes mellitus ([place]) 08/13/2021   Anemia 08/13/2021   Hypoalbuminemia 08/13/2021   Frequent PVCs 08/13/2021   Prolonged QT interval 08/13/2021   Facial droop 08/13/2021   Hormone replacement therapy 08/13/2021   Lesion of parotid gland 08/13/2021   Protein-calorie malnutrition, severe ([place]) 08/13/2021   Feeding difficulties 08/12/2021   Posterior tibial tendon dysfunction, right  04/15/2021   Posterior tibial tendinitis, right leg    Prostate cancer ([place]) 02/11/2020   Orthostatic tremor 10/01/2019   Dyslipidemia 09/12/2019   Nonrheumatic pulmonary valve insufficiency 09/12/2019   Red blood cell antibody positive, compatible PRBC difficult to obtain 02/15/2018   Diplopia 01/03/2018   Benign essential tremor 01/03/2018   Pain in joint of right ankle 05/17/2017   Foot pain 05/17/2017   Peroneal tendinitis 05/17/2017   Depression with anxiety 01/03/2017   Insomnia 05/05/2016   Coronary artery calcification seen on CAT scan    DOE (dyspnea on exertion)    Fatigue 02/27/2015   Elevated homocysteine 09/26/2014   Multiple myeloma ([place]) 09/26/2014   Acquired pes planus of right foot 07/02/2014   Posterior tibial tendon dysfunction 07/02/2014   Encounter for antineoplastic chemotherapy 01/02/2014   Kahler disease ([place]) 01/02/2014   Avitaminosis D 01/02/2014   Amyloidosis ([place]) 03/26/2013   Enlarged lymph node 02/14/2013   Pure hypercholesterolemia 03/29/2011   Memory disorder 09/21/2010   Labile hypertension 09/21/2010    Orientation RESPIRATION BLADDER Height & Weight     Self, Time, Situation, Place  Normal Continent Weight: 121 lb 4.1 oz (55 kg) Height:  '5\' 5"'$  (165.1 cm)  BEHAVIORAL SYMPTOMS/MOOD NEUROLOGICAL BOWEL NUTRITION STATUS      Continent Diet (Currently has Cortrak and TF being reduced; D2 thin liquids)  AMBULATORY STATUS COMMUNICATION OF NEEDS Skin   Limited Assist Verbally Normal                       Personal Care Assistance Level of Assistance  Bathing, Dressing Bathing Assistance: Limited assistance Feeding assistance: Limited  assistance Dressing Assistance: Limited assistance     Functional Limitations Info  Sight, Hearing, Speech Sight Info: Adequate Hearing Info: Impaired (wears hearing aides) Speech Info: Adequate    SPECIAL CARE FACTORS FREQUENCY  PT (By licensed PT), OT (By licensed OT), Speech therapy     PT  Frequency: 5xs per week OT Frequency: 5xs per week     Speech Therapy Frequency: 5xs per week      Contractures      Additional Factors Info  Code Status, Allergies Code Status Info: DNR Allergies Info: See discharge instructions           Current Medications (03/25/2022):  This is the current hospital active medication list Current Facility-Administered Medications  Medication Dose Route Frequency Provider Last Rate Last Admin   acetaminophen (TYLENOL) tablet 325-650 mg  325-650 mg Oral Q4H PRN Setzer, [address], PA-C       acidophilus (RISAQUAD) capsule 1 capsule  1 capsule Oral Daily Barbie Banner, PA-C   1 capsule at 03/25/22 0848   alum & mag hydroxide-simeth (MAALOX/MYLANTA) 200-200-20 MG/5ML suspension 30 mL  30 mL Oral Q4H PRN Barbie Banner, PA-C       [START ON 03/26/2022] amiodarone (PACERONE) tablet 400 mg  400 mg Per Tube Daily Barbie Banner, PA-C       aspirin tablet 325 mg  325 mg Per Tube Daily Meredith Staggers, MD   325 mg at 03/25/22 0848   clopidogrel (PLAVIX) tablet 75 mg  75 mg Per Tube Daily Meredith Staggers, MD   75 mg at 03/25/22 0848   dapagliflozin propanediol (FARXIGA) tablet 10 mg  10 mg Oral Daily Barbie Banner, PA-C   10 mg at 03/25/22 0848   diclofenac Sodium (VOLTAREN) 1 % topical gel 2 g  2 g Topical QID Barbie Banner, PA-C   2 g at 03/25/22 1244   diphenhydrAMINE (BENADRYL) 12.5 MG/5ML elixir 12.5-25 mg  12.5-25 mg Oral Q6H PRN Barbie Banner, PA-C       famotidine (PEPCID) 40 MG/5ML suspension 20 mg  20 mg Per Tube Daily Meredith Staggers, MD   20 mg at 03/25/22 0849   feeding supplement (JEVITY 1.5 CAL/FIBER) liquid 1,000 mL  1,000 mL Per Tube Continuous Meredith Staggers, MD       feeding supplement (PROSource TF20) liquid 60 mL  60 mL Per Tube Daily Barbie Banner, PA-C   60 mL at 03/25/22 0745   guaiFENesin-dextromethorphan (ROBITUSSIN DM) 100-10 MG/5ML syrup 5-10 mL  5-10 mL Oral Q6H PRN Barbie Banner, PA-C       heparin  injection 5,000 Units  5,000 Units Subcutaneous Q8H Barbie Banner, PA-C   5,000 Units at 03/25/22 1414   insulin aspart (novoLOG) injection 0-9 Units  0-9 Units Subcutaneous Q4H Barbie Banner, PA-C   5 Units at 03/25/22 1240   insulin aspart (novoLOG) injection 5 Units  5 Units Subcutaneous TID WC Barbie Banner, PA-C   5 Units at 03/25/22 0926   [START ON 03/26/2022] insulin glargine-yfgn (SEMGLEE) injection 15 Units  15 Units Subcutaneous Daily Meredith Staggers, MD       lidocaine (LIDODERM) 5 % 1 patch  1 patch Transdermal Q24H Barbie Banner, PA-C   1 patch at 03/24/22 1752   lipase/protease/amylase (CREON) capsule 72,000 Units  72,000 Units Oral Q6H Barbie Banner, PA-C   72,000 Units at 03/25/22 1414   magnesium hydroxide (MILK OF MAGNESIA) suspension 30  mL  30 mL Oral Daily PRN Barbie Banner, PA-C       megestrol (MEGACE) 400 MG/10ML suspension 400 mg  400 mg Per Tube BID Alger Simons T, MD   400 mg at 03/25/22 0848   methocarbamol (ROBAXIN) tablet 500 mg  500 mg Oral Q6H PRN Barbie Banner, PA-C   500 mg at 03/23/22 7829   mirabegron ER (MYRBETRIQ) tablet 25 mg  25 mg Oral Daily Barbie Banner, PA-C   25 mg at 03/25/22 0848   nystatin (MYCOSTATIN) 100000 UNIT/ML suspension 500,000 Units  5 mL Oral QID Barbie Banner, PA-C   500,000 Units at 03/25/22 1240   prochlorperazine (COMPAZINE) tablet 5-10 mg  5-10 mg Oral Q6H PRN Barbie Banner, PA-C       Or   prochlorperazine (COMPAZINE) injection 5-10 mg  5-10 mg Intramuscular Q6H PRN Barbie Banner, PA-C       Or   prochlorperazine (COMPAZINE) suppository 12.5 mg  12.5 mg Rectal Q6H PRN Barbie Banner, PA-C       rosuvastatin (CRESTOR) tablet 10 mg  10 mg Per Tube Daily Barbie Banner, PA-C   10 mg at 03/24/22 2238   sacubitril-valsartan (ENTRESTO) 24-26 mg per tablet  1 tablet Per Tube BID Barbie Banner, PA-C   1 tablet at 03/25/22 0848   sodium phosphate (FLEET) 7-19 GM/118ML enema 1 enema  1 enema Rectal  Once PRN Barbie Banner, PA-C       sorbitol 70 % solution 30 mL  30 mL Oral Daily PRN Barbie Banner, PA-C       spironolactone (ALDACTONE) tablet 12.5 mg  12.5 mg Per Tube Daily Barbie Banner, PA-C   12.5 mg at 03/25/22 0848   testosterone (ANDROGEL) 50 MG/5GM (1%) gel 8 g  8 g Transdermal QHS Meredith Staggers, MD       traZODone (DESYREL) tablet 25-50 mg  25-50 mg Oral QHS PRN Setzer, [address]man Circle, PA-C       white petrolatum (VASELINE) gel   Topical PRN Barbie Banner, PA-C         Discharge Medications: Please see discharge summary for a list of discharge medications.  Relevant Imaging Results:  Relevant Lab Results:   Additional Information [id number]  Rana Snare, LCSW

## 2022-03-25 NOTE — Progress Notes (Signed)
PROGRESS NOTE   Subjective/Complaints: Asked when NG will come out. Had grossly bloody stool yesterday late in afternoon with PT.  No further stool occurrences/documentation. Pt partially recalls having blood in stool.  ROS: Patient denies fever, rash, sore throat, blurred vision, dizziness, nausea, vomiting, diarrhea, cough, shortness of breath or chest pain, joint or back/neck pain, headache, or mood change.    Objective:   No results found. Recent Labs    03/22/22 1019 03/23/22 0651  WBC 8.3 7.6  HGB 10.1* 9.9*  HCT 33.0* 32.5*  PLT 632* 566*   Recent Labs    03/22/22 1019 03/23/22 0651  NA 139 140  K 4.7 4.4  CL 108 110  CO2 24 21*  GLUCOSE 217* 191*  BUN 33* 34*  CREATININE 1.36* 1.30*  CALCIUM 8.6* 8.5*    Intake/Output Summary (Last 24 hours) at 03/25/2022 0917 Last data filed at 03/25/2022 0815 Gross per 24 hour  Intake 530 ml  Output 400 ml  Net 130 ml        Physical Exam: Vital Signs Blood pressure (!) 99/51, pulse 81, temperature 98.1 F (36.7 C), temperature source Oral, resp. rate 16, height '5\' 5"'$  (1.651 m), weight 55 kg, SpO2 98 %.  Constitutional: No distress . Vital signs reviewed. HEENT: NCAT, EOMI, oral membranes moist, NGT Neck: supple Cardiovascular: RRR without murmur. No JVD    Respiratory/Chest: CTA Bilaterally without wheezes or rales. Normal effort    GI/Abdomen: BS +, non-tender, non-distended Ext: no clubbing, cyanosis, or edema Psych: pleasant and cooperative  Skin: Clean and intact without signs of breakdown Neuro:  alert, oriented to person, place, reason. Fair awareness. Follows commands. STM deficits. Speech dysarthric but improving. Right CN VI. RUE 4+, LUE 4/5. RLE 4/5 prox with foot drop distally. LLE 4/5.   Musculoskeletal: no pain with AROm/PROM    Assessment/Plan: 1. Functional deficits which require 3+ hours per day of interdisciplinary therapy in a  comprehensive inpatient rehab setting. Physiatrist is providing close team supervision and 24 hour management of active medical problems listed below. Physiatrist and rehab team continue to assess barriers to discharge/monitor patient progress toward functional and medical goals  Care Tool:  Bathing    Body parts bathed by patient: Right arm, Left arm, Chest, Abdomen, Front perineal area, Right upper leg, Left upper leg, Face   Body parts bathed by helper: Buttocks, Right lower leg, Left lower leg     Bathing assist       Upper Body Dressing/Undressing Upper body dressing   What is the patient wearing?: Pull over shirt    Upper body assist Assist Level: Minimal Assistance - Patient > 75%    Lower Body Dressing/Undressing Lower body dressing      What is the patient wearing?: Pants     Lower body assist Assist for lower body dressing: Maximal Assistance - Patient 25 - 49%     Toileting Toileting    Toileting assist Assist for toileting: Maximal Assistance - Patient 25 - 49%     Transfers Chair/bed transfer  Transfers assist     Chair/bed transfer assist level: Minimal Assistance - Patient > 75% Chair/bed transfer assistive device: Environmental consultant  Locomotion Ambulation   Ambulation assist      Assist level: Moderate Assistance - Patient 50 - 74% Assistive device: Walker-rolling Max distance: 12 ft   Walk 10 feet activity   Assist     Assist level: Moderate Assistance - Patient - 50 - 74% Assistive device: Walker-rolling   Walk 50 feet activity   Assist Walk 50 feet with 2 turns activity did not occur: Safety/medical concerns         Walk 150 feet activity   Assist Walk 150 feet activity did not occur: Safety/medical concerns         Walk 10 feet on uneven surface  activity   Assist Walk 10 feet on uneven surfaces activity did not occur: Safety/medical concerns         Wheelchair     Assist Is the patient using a wheelchair?:  Yes Type of Wheelchair: Manual    Wheelchair assist level: Supervision/Verbal cueing Max wheelchair distance: 150 ft    Wheelchair 50 feet with 2 turns activity    Assist        Assist Level: Supervision/Verbal cueing   Wheelchair 150 feet activity     Assist      Assist Level: Supervision/Verbal cueing   Blood pressure (!) 99/51, pulse 81, temperature 98.1 F (36.7 C), temperature source Oral, resp. rate 16, height '5\' 5"'$  (1.651 m), weight 55 kg, SpO2 98 %.  Medical Problem List and Plan: 1. Functional deficits secondary to right occipital infarct d/t right PCA stenosis/occlusion, multiple medical conditions             -patient may shower             -ELOS/Goals: 14-17 days, supervision to min assist with PT and OT, supervision to mod I with SLP.  -Continue CIR therapies including PT, OT, and SLP   2.  Antithrombotics: -DVT/anticoagulation:  Pharmaceutical: Heparin             -antiplatelet therapy: Aspirin 325 mg daily plus Plavix 75 mg daily for 3 months then aspirin alone   3. Pain Management: Tylenol, Lidoderm as needed             -Voltaren gel   4. Mood/Behavior/Sleep: LCSW to evaluate and provide emotional support             -antipsychotic agents: n/a   5. Neuropsych/cognition: This patient is capable of making decisions on his own behalf.   6. Skin/Wound Care: Routine skin care checks   7. Fluids/Electrolytes/Nutrition:   -intake inconsistent yesterday--encouraged pt to pick up po for cortrak removal             -continue tube feeds via Cortrak for now due to malnutrition             -continue Megace --> increased to 400 mg bid             -continue Prosource daily             -Dysphagia 2 diet, thin liquids; advance per SLP recs             -Continue pancreatic enzymes q 6 hours             -continue probiotics             -continue Protonix for GI prophylaxis   1/11- will cut TF back to 30cc/hr and only run at night   -is intake is not too bad  although he tends to take  a while to eat, needs some cueing/supervision to a certain extent 8: VT arrest: Likely due to prior MI versus myocarditis status post EP eval             -follow-up with cardiolgy (meds as per #10)             - no plan now for life vest/ICD 9: CAD status post left heart catheterization             -Continue aspirin and statin   10: Acute on chronic systolic heart failure: currently not requiring diuretic             -Daily weight             -Continue Entresto 24-26 mg BID             -Continue dapagliflozin 10 mg daily             -Continue spironolactone 12.5 mg daily             -continue amiodarone 400 mg BID             -continue carvedilol 3.125 mg BID   Filed Weights   03/22/22 1652 03/23/22 0417 03/24/22 0423  Weight: 56.2 kg 54.2 kg 55 kg    11: Cardiomyopathy: Likely due to myocarditis versus prior MI   12: Bilateral pneumonia; likely aspiration pneumonitis             -completed antibiotics             -CT chest 12/30: R>L bronchopneumonia, chronic sarcoidosis   13: Intraductal papillary mucinous neoplasms of the pancreas status post Whipple procedure 2023             -Continue pancreatic enzymes             -Follow-up with [name]   14: Multiple myeloma with AL amyloidosis: stable; follow-up [name]   15: Influenza A: Status post oseltamivir x 5 days this admission   16: DM: continue CBGs q AC and q HS             -continue SSI             -continue Novolog 5 units with meals             -continue Semglee 15 units daily  CBG (last 3)  Recent Labs    03/25/22 0011 03/25/22 0354 03/25/22 0819  GLUCAP 240* 231* 232*    1/10-11 increased semglee to 20 units--will readjust given that TF will be reduced 17: Oral thrush: will not change to diflucan given TP risk, complex CV hx  -continue nystating as rx'ed   18: OAB: continue Myrbetriq   19: Hyperlipidemia: continue Crestor 10 mg   20: Low testosterone: continue Androgel q HS  -pt  receiving now   21: Right foot drop/R common peroneal neuropathy: continue AFO   22: Hard of hearing: wears hearing aids   23: Right parotid gland lesion seen incidentally on MRI of brain             -ENT referral as outpatient   24: Right cranial nerve VI palsy: continue right eye patch   25: Code status: DNR as above  26. Gross blood in stool:  -one episode yesterday.  -pt without c/o  -will recheck cbc in am    LOS: 3 days A FACE TO FACE EVALUATION WAS PERFORMED  Meredith Staggers 03/25/2022, 9:17 AM

## 2022-03-26 DIAGNOSIS — I639 Cerebral infarction, unspecified: Secondary | ICD-10-CM | POA: Diagnosis not present

## 2022-03-26 DIAGNOSIS — I5023 Acute on chronic systolic (congestive) heart failure: Secondary | ICD-10-CM | POA: Diagnosis not present

## 2022-03-26 DIAGNOSIS — B370 Candidal stomatitis: Secondary | ICD-10-CM | POA: Diagnosis not present

## 2022-03-26 DIAGNOSIS — E119 Type 2 diabetes mellitus without complications: Secondary | ICD-10-CM | POA: Diagnosis not present

## 2022-03-26 LAB — GLUCOSE, CAPILLARY
Glucose-Capillary: 130 mg/dL — ABNORMAL HIGH (ref 70–99)
Glucose-Capillary: 133 mg/dL — ABNORMAL HIGH (ref 70–99)
Glucose-Capillary: 134 mg/dL — ABNORMAL HIGH (ref 70–99)
Glucose-Capillary: 150 mg/dL — ABNORMAL HIGH (ref 70–99)
Glucose-Capillary: 160 mg/dL — ABNORMAL HIGH (ref 70–99)
Glucose-Capillary: 59 mg/dL — ABNORMAL LOW (ref 70–99)
Glucose-Capillary: 64 mg/dL — ABNORMAL LOW (ref 70–99)
Glucose-Capillary: 69 mg/dL — ABNORMAL LOW (ref 70–99)
Glucose-Capillary: 73 mg/dL (ref 70–99)

## 2022-03-26 NOTE — Progress Notes (Signed)
Occupational Therapy Session Note  Patient Details  Name: William Barron MRN: [id number] Date of Birth: Nov 27, 1945  Today's Date: 03/26/2022 OT Individual Time: 1020-1038 OT Individual Time Calculation (min): 18 min    Short Term Goals: Week 1:  OT Short Term Goal 1 (Week 1): Pt iwll transfer to toilet wiht LRAD and MIN A OT Short Term Goal 2 (Week 1): Pt will locate all needed ADL items with no cuing to demo improved vision/compensatory strategies OT Short Term Goal 3 (Week 1): Pt will complete dressing sit to stand wiht MIN A only for balance  Skilled Therapeutic Interventions/Progress Updates:    Pt received in bed with no pain reported but "exhausted" when aroused from sleep. Pt quickly nodding back off. Pt then when aroused again stating, "Im not doing well. I am delusional. I thought you were marilyn monroe" OT provides gentle orientation and pt requests a coke. Provided 4oz of caffiene free cola to pt. Pt completes sup>sit EOB with cuing for hand placement and MOD A. Pt able to consume half of the cola with no s/s of aspiration before requesting to lay down d/t fatigue. Total A to reposition at [employer] and in semi fowlers. Pt asleep before Ot exits.  Pt left at end of session in bed with exit alarm on, call light in reach and all needs met. Pt missed 42 min skilled OT d/t fatigue. Will attempt to make up per POC/tolerance.   Therapy Documentation Precautions:  Precautions Precautions: Fall, Other (comment) Precaution Comments: vision impaired, feeding tube Required Braces or Orthoses: Other Brace Other Brace: RLE AFO and shoes, pt and son think he does worse with AFO on (per chart) Restrictions Weight Bearing Restrictions: No General:   Therapy/Group: Individual Therapy  William Barron 03/26/2022, 6:55 AM

## 2022-03-26 NOTE — Progress Notes (Signed)
  Patient Details  Name: William Barron MRN: [id number] Date of Birth: 1946-01-09  The therapy schedule has been changed to 15/7 d/t pt fatigue and poor tolerance of 3 hours of therapy per day after consulting with MD and treatment team  Tonny Branch 03/26/2022, 12:57 PM

## 2022-03-26 NOTE — Progress Notes (Signed)
Hypoglycemic Event  CBG: 64  Treatment: 4 oz juice/soda  Symptoms: None  Follow-up CBG: Time:2045  CBG Result:59   Possible Reasons for Event: Unknown  Comments/MD notified :  yes    William Barron

## 2022-03-26 NOTE — Progress Notes (Signed)
Physical Therapy Session Note  Patient Details  Name: William Barron MRN: [id number] Date of Birth: 1946-01-15  Today's Date: 03/26/2022 PT Individual Time: 0800-0900 PT Individual Time Calculation (min): 60 min   Short Term Goals: Week 1:  PT Short Term Goal 1 (Week 1): Patient will perform basic transfers with CGA >50% of the time. PT Short Term Goal 2 (Week 1): Patient will ambulate >25 ft consistently using LRAD. PT Short Term Goal 3 (Week 1): Patient will initiate stair training.  Skilled Therapeutic Interventions/Progress Updates: Pt presents propped up in bed and attempting to eat breakfast.  Pt encouraged to transfer to w/c to complete breakfast and agreeable.  PT donned THT w/ Total A in supine, pt lifting LE s. PT threaded pants over feet and then pt pulled up and able to bridge to pull over hips, although needed to be completed w/ standing.  Pt transfers sup to sit from elevated head w/ supervision.  Pt sat EOB and required mod A for doffing old shirt and donning pull-over shirt, coretrac re-pinned to shirt.  Pt w/ some SOB noted but O2 at 100%.  PT donned shoes and pt transferred sit to stand w/ min A and SPT to w/c w/ RW.  BP checked in w/c w/ c/o dizziness, at 98/64, but disippates w/ sitting.  Nursing notified.  Pt assisted w/ set-up to complete breakfast w/ weighted spoon.  MD in for assessment and encouragement of nutrition.  Chair alarm on and all needs in reach.     Therapy Documentation Precautions:  Precautions Precautions: Fall, Other (comment) Precaution Comments: vision impaired, feeding tube Required Braces or Orthoses: Other Brace Other Brace: RLE AFO and shoes, pt and son think he does worse with AFO on (per chart) Restrictions Weight Bearing Restrictions: No General:   Vital Signs:  Pain:9/10 Pain Assessment Pain Scale: 0-10 Pain Score: 8  Pain Type: Chronic pain Pain Location: Back Pain Orientation: Lower Pain Descriptors / Indicators:  Aching Patients Stated Pain Goal: 0 Pain Intervention(s): Medication (See eMAR)   Therapy/Group: Individual Therapy  [name] 03/26/2022, 10:23 AM

## 2022-03-26 NOTE — Progress Notes (Signed)
PROGRESS NOTE   Subjective/Complaints: Has breakfast sitting in front of him. Ate some of yogurt. Therapy reporting that he's still easily fatigable. Pt reports occasional cough.  ROS: Patient denies fever, rash, sore throat, blurred vision, dizziness, nausea, vomiting, diarrhea,  shortness of breath or chest pain, joint or back/neck pain, headache, or mood change.    Objective:   No results found. Recent Labs    03/25/22 1240  WBC 8.8  HGB 10.5*  HCT 34.5*  PLT 576*   No results for input(s): "NA", "K", "CL", "CO2", "GLUCOSE", "BUN", "CREATININE", "CALCIUM" in the last 72 hours.   Intake/Output Summary (Last 24 hours) at 03/26/2022 1209 Last data filed at 03/26/2022 0857 Gross per 24 hour  Intake 380 ml  Output 900 ml  Net -520 ml        Physical Exam: Vital Signs Blood pressure (!) 97/50, pulse 81, temperature 98.7 F (37.1 C), resp. rate 18, height '5\' 5"'$  (1.651 m), weight 54.4 kg, SpO2 99 %.  Constitutional: No distress . Vital signs reviewed. HEENT: NCAT, EOMI, oral membranes moist Neck: supple Cardiovascular: RRR without murmur. No JVD    Respiratory/Chest: CTA Bilaterally without wheezes or rales. Normal effort    GI/Abdomen: BS +, non-tender, non-distended Ext: no clubbing, cyanosis, or edema Psych: pleasant and cooperative  Skin: Clean and intact without signs of breakdown Neuro:  alert, oriented to person, place, reason, missed day of week by one. Fair awareness. Follows commands. STM deficits. Speech dysarthric but improving. Right CN VI. RUE 4+, LUE 4/5. RLE 4/5 prox with foot drop distally. LLE 4/5.   Musculoskeletal: no pain with AROm/PROM    Assessment/Plan: 1. Functional deficits which require 3+ hours per day of interdisciplinary therapy in a comprehensive inpatient rehab setting. Physiatrist is providing close team supervision and 24 hour management of active medical problems listed  below. Physiatrist and rehab team continue to assess barriers to discharge/monitor patient progress toward functional and medical goals  Care Tool:  Bathing    Body parts bathed by patient: Right arm, Left arm, Chest, Abdomen, Front perineal area, Right upper leg, Left upper leg, Face   Body parts bathed by helper: Buttocks, Right lower leg, Left lower leg     Bathing assist Assist Level: Moderate Assistance - Patient 50 - 74%     Upper Body Dressing/Undressing Upper body dressing   What is the patient wearing?: Pull over shirt    Upper body assist Assist Level: Minimal Assistance - Patient > 75%    Lower Body Dressing/Undressing Lower body dressing      What is the patient wearing?: Pants     Lower body assist Assist for lower body dressing: Maximal Assistance - Patient 25 - 49%     Toileting Toileting    Toileting assist Assist for toileting: Maximal Assistance - Patient 25 - 49%     Transfers Chair/bed transfer  Transfers assist     Chair/bed transfer assist level: Minimal Assistance - Patient > 75% Chair/bed transfer assistive device: Programmer, multimedia   Ambulation assist      Assist level: Moderate Assistance - Patient 50 - 74% Assistive device: Walker-rolling Max distance: 12 ft  Walk 10 feet activity   Assist     Assist level: Moderate Assistance - Patient - 50 - 74% Assistive device: Walker-rolling   Walk 50 feet activity   Assist Walk 50 feet with 2 turns activity did not occur: Safety/medical concerns         Walk 150 feet activity   Assist Walk 150 feet activity did not occur: Safety/medical concerns         Walk 10 feet on uneven surface  activity   Assist Walk 10 feet on uneven surfaces activity did not occur: Safety/medical concerns         Wheelchair     Assist Is the patient using a wheelchair?: Yes Type of Wheelchair: Manual    Wheelchair assist level: Supervision/Verbal cueing Max  wheelchair distance: 150 ft    Wheelchair 50 feet with 2 turns activity    Assist        Assist Level: Supervision/Verbal cueing   Wheelchair 150 feet activity     Assist      Assist Level: Supervision/Verbal cueing   Blood pressure (!) 97/50, pulse 81, temperature 98.7 F (37.1 C), resp. rate 18, height '5\' 5"'$  (1.651 m), weight 54.4 kg, SpO2 99 %.  Medical Problem List and Plan: 1. Functional deficits secondary to right occipital infarct d/t right PCA stenosis/occlusion, multiple medical conditions             -patient may shower             -ELOS/Goals: 14-17 days, supervision to min assist with PT and OT, supervision to mod I with SLP.  -Continue CIR therapies including PT, OT, and SLP--reduced therapy intensity to 15/7 d/t poor stamina    2.  Antithrombotics: -DVT/anticoagulation:  Pharmaceutical: Heparin             -antiplatelet therapy: Aspirin 325 mg daily plus Plavix 75 mg daily for 3 months then aspirin alone   3. Pain Management: Tylenol, Lidoderm as needed             -Voltaren gel   4. Mood/Behavior/Sleep: LCSW to evaluate and provide emotional support             -antipsychotic agents: n/a   5. Neuropsych/cognition: This patient is capable of making decisions on his own behalf.   6. Skin/Wound Care: Routine skin care checks   7. Fluids/Electrolytes/Nutrition:   -intake inconsistent yesterday--encouraged pt to pick up po for cortrak removal             -continue tube feeds via Cortrak for now due to malnutrition             -continue Megace --> increased to 400 mg bid             -continue Prosource daily             -Dysphagia 2 diet, thin liquids; advance per SLP recs             -Continue pancreatic enzymes q 6 hours             -continue probiotics             -continue Protonix for GI prophylaxis   1/11- cut back TF  to 30cc/hr and only run at night  1/12- eating 30-50+% but needs encouragement and cueing at times 8: VT arrest: Likely due to  prior MI versus myocarditis status post EP eval             -  follow-up with cardiolgy (meds as per #10)             - no plan now for life vest/ICD 9: CAD status post left heart catheterization             -Continue aspirin and statin   10: Acute on chronic systolic heart failure: currently not requiring diuretic             -Daily weight             -Continue Entresto 24-26 mg BID             -Continue dapagliflozin 10 mg daily             -Continue spironolactone 12.5 mg daily             -continue amiodarone 400 mg BID             -continue carvedilol 3.125 mg BID   Filed Weights   03/23/22 0417 03/24/22 0423 03/26/22 0500  Weight: 54.2 kg 55 kg 54.4 kg    11: Cardiomyopathy: Likely due to myocarditis versus prior MI   12: Bilateral pneumonia; likely aspiration pneumonitis             -completed antibiotics             -CT chest 12/30: R>L bronchopneumonia, chronic sarcoidosis   13: Intraductal papillary mucinous neoplasms of the pancreas status post Whipple procedure 2023             -Continue pancreatic enzymes             -Follow-up with [name]   14: Multiple myeloma with AL amyloidosis: stable; follow-up [name]   15: Influenza A: Status post oseltamivir x 5 days this admission   16: DM: continue CBGs q AC and q HS             -continue SSI             -continue Novolog 5 units with meals               CBG (last 3)  Recent Labs    03/26/22 0408 03/26/22 0744 03/26/22 1140  GLUCAP 160* 133* 150*    1/12 Semglee reduced back to 15u daily since I cut TF in half---continue for now 17: Oral thrush: will not change to diflucan given TP risk, complex CV hx  -continue nystating as rx'ed   18: OAB: continue Myrbetriq   19: Hyperlipidemia: continue Crestor 10 mg   20: Low testosterone: continue Androgel q HS  -pt receiving now   21: Right foot drop/R common peroneal neuropathy: continue AFO   22: Hard of hearing: wears hearing aids   23: Right parotid gland  lesion seen incidentally on MRI of brain             -ENT referral as outpatient   24: Right cranial nerve VI palsy: continue right eye patch   25: Code status: DNR as above  26. Gross blood in stool:  -two episodes (wed afternoon and Thursday morning --which wasn't recorded--)  -hgb stable  -continue to monitor only for now  -cbc monday    LOS: 4 days A FACE TO FACE EVALUATION WAS PERFORMED  Meredith Staggers 03/26/2022, 12:09 PM

## 2022-03-26 NOTE — Progress Notes (Signed)
Hypoglycemic Event  CBG: 59  Treatment: 8 oz juice/soda  Symptoms: None  Follow-up CBG: Time:2130 CBG Result:69  Possible Reasons for Event: Unknown  Comments/MD notified:yes  Rechecked at 2144 and CBG is William Barron

## 2022-03-26 NOTE — Progress Notes (Signed)
Patient ID: William Barron, male   DOB: September 17, 1945, 77 y.o.   MRN: [id number]  SW faxed [place] referral with clinicals to [place] [phone].  SW received phone call from [facility] with [place] [phone]) who reported that pt wife has discussed with them admission into their long term care unit. Reports SW to contact Dorene Sorrow as she will be primary caregiver. SW informed will be faxing over [facility] form today. SW shared will call pt wife as well to explain [facility] must approve for approval first.   2060-RV spoke with Daryll Brod with [place] ([phone]/[phone]) to confirm if referral received. Reports he will reviewed on Tuesday since received after 11am. Monday is [place] holiday.   1522-SW left message for pt wife informing received call from [facility] and confirms their report of the [place] must approve for admission before her husband could transition. Also asked her to speak with her husband about SNF placement since there is a report of LTC placement vs short term rehab. SW waiting on follow-up.  Loralee Pacas, MSW, [place] Office: [phone] Cell: [phone] Fax: [phone]

## 2022-03-26 NOTE — Progress Notes (Signed)
Physical Therapy Session Note  Patient Details  Name: William Barron MRN: [id number] Date of Birth: October 28, 1945  Today's Date: 03/26/2022 PT Individual Time: 1415-1500 PT Individual Time Calculation (min): 45 min   Short Term Goals: Week 1:  PT Short Term Goal 1 (Week 1): Patient will perform basic transfers with CGA >50% of the time. PT Short Term Goal 2 (Week 1): Patient will ambulate >25 ft consistently using LRAD. PT Short Term Goal 3 (Week 1): Patient will initiate stair training.  Skilled Therapeutic Interventions/Progress Updates:     Pt received supinme in bed and agrees to therapy. No complaint of pain but does report that he needs to urinate. Pt asking to use urinal but unable to successfully pull down brief to position penis in urinal. PT encourages pt to use bed side commode and eventually pt is agreeable. MaxA for supine to sit with cues for logrolling and body mechanics.Pt then performs sit to stand from edge of bed with RW and modA. ModA for stand step transfer to bed side commode. PT provides totalA to pull down pants and brief and pt is continent of bladder in [facility]. Pt attempts to have BM but is unsuccessful. Pt flexed forward on BSC and appears very fatigued. Pt stands with RW and modA and as PT is attempting to pull up pants, pt pivots to bed and sits down, then returns to supine with pants and brief still around lower legs. PT able to perform partial bridge with cues for body mechanics and PT provides totalA to pull up brief and pants. Pt still positioned toward foot of bed with both legs hanging off side of bed. Pt takes extended supine rest break and then tells PT that he feels like he does not have energy for any additional therapy today. PT able to guide pt back up to sitting with modA/maxA and into standing with modA facilitation at hips for extension and anterior weight shift. Pt takes several side steps toward HOB to the R with PT providing manual facilitation of foot  movement as pt is unable to clear feet from ground. Pt requires one seated rest break during sidesteps to top rail and return to supine. Pt misses 30 minutes of skilled PT due to fatigue.  Therapy Documentation Precautions:  Precautions Precautions: Fall, Other (comment) Precaution Comments: vision impaired, feeding tube Required Braces or Orthoses: Other Brace Other Brace: RLE AFO and shoes, pt and son think he does worse with AFO on (per chart) Restrictions Weight Bearing Restrictions: No    Therapy/Group: Individual Therapy  Breck Coons, PT, DPT 03/26/2022, 4:43 PM

## 2022-03-27 DIAGNOSIS — I9589 Other hypotension: Secondary | ICD-10-CM

## 2022-03-27 DIAGNOSIS — I639 Cerebral infarction, unspecified: Secondary | ICD-10-CM | POA: Diagnosis not present

## 2022-03-27 LAB — GLUCOSE, CAPILLARY
Glucose-Capillary: 103 mg/dL — ABNORMAL HIGH (ref 70–99)
Glucose-Capillary: 133 mg/dL — ABNORMAL HIGH (ref 70–99)
Glucose-Capillary: 146 mg/dL — ABNORMAL HIGH (ref 70–99)
Glucose-Capillary: 172 mg/dL — ABNORMAL HIGH (ref 70–99)
Glucose-Capillary: 205 mg/dL — ABNORMAL HIGH (ref 70–99)
Glucose-Capillary: 91 mg/dL (ref 70–99)

## 2022-03-27 MED ORDER — SACUBITRIL-VALSARTAN 24-26 MG PO TABS
1.0000 | ORAL_TABLET | Freq: Two times a day (BID) | ORAL | Status: DC
  Administered 2022-03-28: 1 via ORAL
  Filled 2022-03-27: qty 1

## 2022-03-27 MED ORDER — ASPIRIN 81 MG PO CHEW
81.0000 mg | CHEWABLE_TABLET | Freq: Every day | ORAL | Status: DC
  Administered 2022-03-28 – 2022-03-29 (×2): 81 mg via ORAL
  Filled 2022-03-27 (×4): qty 1

## 2022-03-27 MED ORDER — DAPAGLIFLOZIN PROPANEDIOL 10 MG PO TABS
10.0000 mg | ORAL_TABLET | Freq: Every day | ORAL | Status: DC
  Administered 2022-03-28: 10 mg via ORAL
  Filled 2022-03-27 (×3): qty 1

## 2022-03-27 NOTE — Progress Notes (Signed)
Occupational Therapy Session Note  Patient Details  Name: William Barron MRN: [id number] Date of Birth: 1945/09/20  Today's Date: 03/27/2022 OT Individual Time: 0700-0800 OT Individual Time Calculation (min): 60 min    Short Term Goals: Week 1:  OT Short Term Goal 1 (Week 1): Pt iwll transfer to toilet wiht LRAD and MIN A OT Short Term Goal 2 (Week 1): Pt will locate all needed ADL items with no cuing to demo improved vision/compensatory strategies OT Short Term Goal 3 (Week 1): Pt will complete dressing sit to stand wiht MIN A only for balance  Skilled Therapeutic Interventions/Progress Updates:    Pt received in bed pn bed pan which was unused because pt fell asleep.  No pain reported Supine no teds: 91/58 Supine with teds: 101/59 EOB for dressing: HR 85 O2 98 BP 111/57  ADL: Pt completes at EOB and bed level Bed level LB for energy conwservation EOB UB with MIN A Toilet transfer MOD A with cuing for grab bar use  MAX A for toileting components d/t poor posture and standing tolerance Continent BM on toilet with pillows placed behind back d/t poor unsupported sitting tolerance. mod amount of blood in stool. RN alerted.   Pt left at end of session in [place] with exit alarm on, call light in reach and all needs met    Therapy Documentation Precautions:  Precautions Precautions: Fall, Other (comment) Precaution Comments: vision impaired, feeding tube Required Braces or Orthoses: Other Brace Other Brace: RLE AFO and shoes, pt and son think he does worse with AFO on (per chart) Restrictions Weight Bearing Restrictions: No General:    Therapy/Group: Individual Therapy  RODRIGO MCGRANAHAN 03/27/2022, 6:23 AM

## 2022-03-27 NOTE — Progress Notes (Addendum)
PROGRESS NOTE  Severe orthostatic event down to 59/38 in sitting , accompanied by foggy vision    Subjective/Complaints:   ROS: Patient denies CP, SOB, N/V/D   Objective:   No results found. Recent Labs    03/25/22 1240  WBC 8.8  HGB 10.5*  HCT 34.5*  PLT 576*    No results for input(s): "NA", "K", "CL", "CO2", "GLUCOSE", "BUN", "CREATININE", "CALCIUM" in the last 72 hours.   Intake/Output Summary (Last 24 hours) at 03/27/2022 0926 Last data filed at 03/27/2022 0800 Gross per 24 hour  Intake 720 ml  Output 287 ml  Net 433 ml         Physical Exam: Vital Signs Blood pressure (!) 92/56, pulse 76, temperature 98.3 F (36.8 C), temperature source Oral, resp. rate 18, height '5\' 5"'$  (1.651 m), weight 54.3 kg, SpO2 97 %.  ENT- Cortrak in nares  General: No acute distress Mood and affect are appropriate Heart: Regular rate and rhythm no rubs murmurs or extra sounds Lungs: Clear to auscultation, breathing unlabored, no rales or wheezes Abdomen: Positive bowel sounds, soft nontender to palpation, nondistended Extremities: No clubbing, cyanosis, or edema   Neuro:  alert, oriented to person, place, reason, missed day of week by one. Fair awareness. Follows commands. STM deficits. Speech dysarthric but improving. Right CN VI palsy. RUE 4+, LUE 4/5. RLE 4/5 prox with foot drop distally. LLE 4/5.   Musculoskeletal: no pain with AROm/PROM    Assessment/Plan: 1. Functional deficits which require 3+ hours per day of interdisciplinary therapy in a comprehensive inpatient rehab setting. Physiatrist is providing close team supervision and 24 hour management of active medical problems listed below. Physiatrist and rehab team continue to assess barriers to discharge/monitor patient progress toward functional and medical goals  Care Tool:  Bathing    Body parts bathed by patient: Right arm, Left arm, Chest, Abdomen, Front  perineal area, Right upper leg, Left upper leg, Face   Body parts bathed by helper: Buttocks, Right lower leg, Left lower leg     Bathing assist Assist Level: Moderate Assistance - Patient 50 - 74%     Upper Body Dressing/Undressing Upper body dressing   What is the patient wearing?: Pull over shirt    Upper body assist Assist Level: Minimal Assistance - Patient > 75%    Lower Body Dressing/Undressing Lower body dressing      What is the patient wearing?: Pants     Lower body assist Assist for lower body dressing: Maximal Assistance - Patient 25 - 49%     Toileting Toileting    Toileting assist Assist for toileting: Maximal Assistance - Patient 25 - 49%     Transfers Chair/bed transfer  Transfers assist     Chair/bed transfer assist level: Minimal Assistance - Patient > 75% Chair/bed transfer assistive device: Programmer, multimedia   Ambulation assist      Assist level: Moderate Assistance - Patient 50 - 74% Assistive device: Walker-rolling Max distance: 12 ft   Walk 10 feet activity   Assist     Assist level: Moderate Assistance - Patient - 50 - 74% Assistive device: Walker-rolling   Walk 50 feet  activity   Assist Walk 50 feet with 2 turns activity did not occur: Safety/medical concerns         Walk 150 feet activity   Assist Walk 150 feet activity did not occur: Safety/medical concerns         Walk 10 feet on uneven surface  activity   Assist Walk 10 feet on uneven surfaces activity did not occur: Safety/medical concerns         Wheelchair     Assist Is the patient using a wheelchair?: Yes Type of Wheelchair: Manual    Wheelchair assist level: Supervision/Verbal cueing Max wheelchair distance: 150 ft    Wheelchair 50 feet with 2 turns activity    Assist        Assist Level: Supervision/Verbal cueing   Wheelchair 150 feet activity     Assist      Assist Level: Supervision/Verbal cueing    Blood pressure (!) 92/56, pulse 76, temperature 98.3 F (36.8 C), temperature source Oral, resp. rate 18, height '5\' 5"'$  (1.651 m), weight 54.3 kg, SpO2 97 %.  Medical Problem List and Plan: 1. Functional deficits secondary to right occipital infarct d/t right PCA stenosis/occlusion, multiple medical conditions             -patient may shower             -ELOS/Goals: 14-17 days, supervision to min assist with PT and OT, supervision to mod I with SLP.  -Continue CIR therapies including PT, OT, and SLP--reduced therapy intensity to 15/7 d/t poor stamina    2.  Antithrombotics: -DVT/anticoagulation:  Pharmaceutical: Heparin             -antiplatelet therapy: Aspirin 325 mg daily plus Plavix 75 mg daily for 3 months then aspirin alone   3. Pain Management: Tylenol, Lidoderm as needed             -Voltaren gel   4. Mood/Behavior/Sleep: LCSW to evaluate and provide emotional support             -antipsychotic agents: n/a   5. Neuropsych/cognition: This patient is capable of making decisions on his own behalf.   6. Skin/Wound Care: Routine skin care checks   7. Fluids/Electrolytes/Nutrition:   -intake inconsistent yesterday--encouraged pt to pick up po for cortrak removal             -continue tube feeds via Cortrak for now due to malnutrition             -continue Megace --> increased to 400 mg bid             -continue Prosource daily             -Dysphagia 2 diet, thin liquids; advance per SLP recs             -Continue pancreatic enzymes q 6 hours             -continue probiotics             -continue Protonix for GI prophylaxis   1/11- cut back TF  to 30cc/hr and only run at night  1/13 eating 25-50+% but needs encouragement and cueing at times 8: VT arrest: Likely due to prior MI versus myocarditis status post EP eval             -follow-up with cardiology (meds as per #10)             - no plan now for life vest/ICD 9:  CAD status post left heart catheterization              -Continue aspirin and statin   10: Acute on chronic systolic heart failure: currently not requiring diuretic             -Daily weight             -Continue Entresto 24-26 mg BID             -Continue dapagliflozin 10 mg daily             -Continue spironolactone 12.5 mg daily             -continue amiodarone 400 mg BID             -continue carvedilol 3.125 mg BID   Filed Weights   03/24/22 0423 03/26/22 0500 03/27/22 0407  Weight: 55 kg 54.4 kg 54.3 kg    11: Cardiomyopathy: Likely due to myocarditis versus prior MI   12: Bilateral pneumonia; likely aspiration pneumonitis             -completed antibiotics             -CT chest 12/30: R>L bronchopneumonia, chronic sarcoidosis   13: Intraductal papillary mucinous neoplasms of the pancreas status post Whipple procedure 2023             -Continue pancreatic enzymes             -Follow-up with [name]   14: Multiple myeloma with AL amyloidosis: stable; follow-up [name]   15: Influenza A: Status post oseltamivir x 5 days this admission   16: DM: continue CBGs q AC and q HS             -continue SSI             -continue Novolog 5 units with meals               CBG (last 3)  Recent Labs    03/27/22 0025 03/27/22 0405 03/27/22 0808  GLUCAP 205* 103* 172*     1/12 Semglee reduced back to 15u daily since I cut TF in half---continue for now 17: Oral thrush: will not change to diflucan given TP risk, complex CV hx  -continue nystating as rx'ed   18: OAB: continue Myrbetriq   19: Hyperlipidemia: continue Crestor 10 mg   20: Low testosterone: continue Androgel q HS  -pt receiving now   21: Right foot drop/R common peroneal neuropathy: continue AFO   22: Hard of hearing: wears hearing aids   23: Right parotid gland lesion seen incidentally on MRI of brain             -ENT referral as outpatient   24: Right cranial nerve VI palsy: continue right eye patch   25: Code status: DNR as above  26. Gross blood in  stool:  -two episodes (wed afternoon and Thursday morning --which wasn't recorded--)  -hgb stable  -continue to monitor only for now  -cbc monday   27.  Orthostatic hypotension- likley volume depletion plus diuretics nd antihypertensive, hx of CHF with EF of 35%,  will ask cardiology to eval  LOS: 5 days A FACE TO FACE EVALUATION WAS PERFORMED  Charlett Blake 03/27/2022, 9:26 AM

## 2022-03-27 NOTE — Consult Note (Addendum)
Cardiology Consultation   Patient ID: William Barron MRN: [id number]; DOB: 01-11-1946  Admit date: 03/22/2022 Date of Consult: 03/27/2022  PCP:  [name], [place] [facility] Cardiologist:  Minus Breeding, MD and Dr. Haroldine Laws   Patient Profile:   William Barron is a 77 y.o. male with a hx of nonobstructive CAD, chronic systolic and diastolic heart failure, VT arrest, AL amyloidosis, multiple myeloma, history of prostate cancer s/p XRT, suspected pancreatic cancer status post Whipple at [facility] 07/2021, dyslipidemia, type 2 diabetes, ischemic CVA, tremor, mild dementia, parotid gland lesion, who is being seen 03/27/2022 for the evaluation of hypotension at the request of Dr. Naaman Plummer.  History of Present Illness:   William Barron with above complex medical history who is currently at [facility] for rehab following a long hospitalization from 03/05/2022 to 03/22/2022 for acute right PCA stroke and VT/VF arrest.   Per chart review, he is treated by [facility] oncology for multiple myeloma and AL amyloidosis since 2014, off steroids and chemotherapy since 2022 due to weakness and deconditioning.  He was diagnosed with pancreatic cancer and completed radiation therapy 05/2020. He underwent Whipple 07/2021 for suspected pancreatic cancer with negative pathology in the end. He has been overall weak and decondition since 07/2021 and essentially wheelchair bound.   He is historically followed up by Dr. Percival Spanish for elevated coronary calcium and mild to moderate AAS/AI.  LHC from 05/2015 showed mild non-obstructive CAD with 20-25% stenosis of left main, LAD, and LCX. EF normal on LV gram at that time. Echo from 9/23 showed  LVEF of 55-60%, grade I DD, normal RV, mild to moderate AS/AI with mean gradient of 16.0 mmHg, moderate pulmonic regurgitation.   He was admitted 03/05/22 for flu and acute right PCA stroke with headache and vision change.    Echo from 03/06/2022 revealed LVEF worsened to  30 to 35%, grade 2 DD, normal RV, small pericardial effusion, mild MR, mild AI and AS.  Hs trop 152>144. Etiology of new LV dysfunction was felt due to influenza A and stroke. Course complicated by VT arrest 03/11/2022, with ROSC achieved within 1 minute of chest compressions, without defibrillation.  He underwent right and left heart cath 03/11/2022 revealed moderate focal CAD, cardiomyopathy was felt out of proportion to the degree of CAD.  Low output CHF with PA sat 45% CI 1.87. Mean PCWP 9 mmHg. cMRI findings are not consistent with cardiac AL amyloidosis, LVEF 33%, RVEF 46%, predominantly mid-wall LGE with some subendocardial component inthe basal inferolateral wall and focally in the mid lateral wall, possible myocarditis.  He was seen by EP, ultimately felt a poor candidate for ICD and recommended amiodarone and LifeVest at discharge until recovery of EF/myocarditis.  He was seen by advanced heart failure team, felt not need loop diuretic, ultimately discharged on GDMT with Entresto 24/26 twice daily, Farxiga 10 mg daily, and spironolactone 12.5 mg daily.  His prognosis was felt guarded given chronic debility and failure to thrive along with active pneumonia.  He was discharged to [facility] 03/22/2022 for rehab.   At rehab, his therapy intensity has been limited due to poor stamina.  He continues to require tube feeds for malnutrition and dysphagia. He remains DNR.  He had 2 episodes of gross blood in stool noted on Wednesday 03/24/2022 afternoon and Thursday 03/25/2022 morning per progress note.  His hemoglobin has remained stable.  He was noted with orthostatic hypotension with blood pressure dropped to 59/30 today.  Cardiology consult is  requested for further evaluation of GDMT.  Upon encounter, patient is slow to response, laying in bed. He states he has no dizziness when he was found to have low BP. He reports chest pain last night while resting in bed. He felt SOB at the time. He states he has been feeling  chest pain and SOB almost daily over the past few weeks. He denied any lightheadedness. He is getting nightly tube feeds for malnutrition, eats and drinks poorly. Nurse states his BP was low 83/53, patient seems "lethargic", but nurse is new to him today and unknown baseline. He denied any active chest pain now.      Past Medical History:  Diagnosis Date   Amyloidosis ([place])    Diabetes mellitus without complication ([place])    Feeding tube dysfunction, initial encounter 08/12/2021   GERD (gastroesophageal reflux disease)    Hearing loss    Has hearing aids   History of kidney stones    Hyperlipidemia    Jejunostomy tube present ([place])    Multiple myeloma ([facility])    and amylodosis    Nephrolithiasis    Occasional tremors    Pancreatic insufficiency    Pneumonia    Prostate cancer ([place])    Prostatitis    acute and chronic   Skin cancer     Past Surgical History:  Procedure Laterality Date   APPENDECTOMY     CARDIAC CATHETERIZATION N/A 06/13/2015   Procedure: Left Heart Cath and Coronary Angiography;  Surgeon: Jettie Booze, MD;  Location: [place] [facility];  Service: Cardiovascular;  Laterality: N/A;   COLONOSCOPY  04/07/2000   ELBOW SURGERY  03/15/2001   right   ESOPHAGOGASTRODUODENOSCOPY (EGD) WITH PROPOFOL N/A 09/07/2021   Procedure: ESOPHAGOGASTRODUODENOSCOPY (EGD) WITH PROPOFOL;  Surgeon: Irene Shipper, MD;  Location: [place];  Service: Gastroenterology;  Laterality: N/A;   EXTRACORPOREAL SHOCK WAVE LITHOTRIPSY Left 07/17/2018   Procedure: EXTRACORPOREAL SHOCK WAVE LITHOTRIPSY (ESWL);  Surgeon: [name], MD;  Location: [facility];  Service: Urology;  Laterality: Left;   EXTRACORPOREAL SHOCK WAVE LITHOTRIPSY Right 12/08/2020   Procedure: RIGHT EXTRACORPOREAL SHOCK WAVE LITHOTRIPSY (ESWL);  Surgeon: Raynelle Bring, MD;  Location: [facility];  Service: Urology;  Laterality: Right;   FOOT ARTHRODESIS Right 04/15/2021   Procedure: RIGHT SUBTALAR AND  TALONAVICULAR FUSION;  Surgeon: Newt Minion, MD;  Location: [place];  Service: Orthopedics;  Laterality: Right;   HERNIA REPAIR     IR MECH REMOV OBSTRUC MAT ANY COLON TUBE W/FLUORO  08/27/2021   IR REPLC GASTRO/COLONIC TUBE PERCUT W/FLUORO  09/08/2021   RIGHT/LEFT HEART CATH AND CORONARY ANGIOGRAPHY N/A 03/11/2022   Procedure: RIGHT/LEFT HEART CATH AND CORONARY ANGIOGRAPHY;  Surgeon: Jettie Booze, MD;  Location: [place] [facility];  Service: Cardiovascular;  Laterality: N/A;   ROTATOR CUFF REPAIR     WHIPPLE PROCEDURE  07/13/2021     Home Medications:  Prior to Admission medications   Medication Sig Start Date End Date Taking? Authorizing Provider  amiodarone (PACERONE) 400 MG tablet Place 1 tablet (400 mg total) into feeding tube 2 (two) times daily for 4 days, THEN 1 tablet (400 mg total) daily. 03/22/22 04/25/22  Mariel Aloe, MD  aspirin 325 MG tablet Take 1 tablet (325 mg total) by mouth daily. 03/22/22   Mariel Aloe, MD  BD PEN NEEDLE NANO U/F 32G X 4 MM MISC USE AS DIRECTED WITH INSULIN PEN THREE TIMES DAILY 01/27/22   Elayne Snare, MD  clopidogrel (PLAVIX)  75 MG tablet Place 1 tablet (75 mg total) into feeding tube daily. 03/22/22   Mariel Aloe, MD  Continuous Blood Gluc Receiver (FREESTYLE LIBRE 2 READER) DEVI Use as instructed to check blood sugars 11/20/21   Elayne Snare, MD  Continuous Blood Gluc Sensor (FREESTYLE LIBRE 3 SENSOR) MISC Place 1 sensor on the skin every 14 days. Use to check glucose continuously 03/05/22   Elayne Snare, MD  Cyanocobalamin (B-12 PO) Take 1 tablet by mouth in the morning.    [provider]  dapagliflozin propanediol (FARXIGA) 10 MG TABS tablet Take 1 tablet (10 mg total) by mouth daily. 03/22/22   Mariel Aloe, MD  diclofenac Sodium (VOLTAREN) 1 % GEL Apply 2 g topically 4 (four) times daily. 03/22/22   Mariel Aloe, MD  insulin aspart (NOVOLOG FLEXPEN) 100 UNIT/ML FlexPen Inject 8 Units into the skin 3 (three) times daily before  meals.    [provider]  insulin degludec (TRESIBA FLEXTOUCH) 100 UNIT/ML FlexTouch Pen Inject 15 Units into the skin daily. Patient taking differently: Inject 6 Units into the skin in the morning. 11/17/21   Elayne Snare, MD  lipase/protease/amylase (CREON) 36000 UNITS CPEP capsule Take 3 capsules (108,000 Units total) by mouth 3 (three) times daily with meals. May also take 1 capsule (36,000 Units total) as needed (with snacks). Patient taking differently: Take 2-3 capsules (72000-108000 units) by mouth 3 (three) times daily with meals. May also take 1 capsule (36,000 units) as needed (with snacks). 01/14/22   Gatha Mayer, MD  megestrol (MEGACE) 400 MG/10ML suspension Place 10 mLs (400 mg total) into feeding tube daily. 03/23/22   Mariel Aloe, MD  Nutritional Supplements (FEEDING SUPPLEMENT, JEVITY 1.5 CAL/FIBER,) LIQD Place 1,000 mLs into feeding tube continuous. 03/22/22   Mariel Aloe, MD  pantoprazole sodium (PROTONIX) 40 mg Take 40 mg by mouth daily. Patient taking differently: Take 40 mg by mouth in the morning. 01/14/22   Gatha Mayer, MD  Protein (FEEDING SUPPLEMENT, PROSOURCE TF20,) liquid Place 60 mLs into feeding tube daily. 03/22/22   Mariel Aloe, MD  rosuvastatin (CRESTOR) 10 MG tablet Place 1 tablet (10 mg total) into feeding tube daily. 03/22/22   Mariel Aloe, MD  sacubitril-valsartan (ENTRESTO) 24-26 MG Take 1 tablet by mouth 2 (two) times daily. 03/22/22   Mariel Aloe, MD  spironolactone (ALDACTONE) 25 MG tablet Place 0.5 tablets (12.5 mg total) into feeding tube daily. 03/22/22   Mariel Aloe, MD  Testosterone 20.25 MG/ACT (1.62%) GEL Apply 4 Pump topically at bedtime.    [provider]  Vibegron (GEMTESA) 75 MG TABS Take 75 mg by mouth in the morning.    [provider]    Inpatient Medications: Scheduled Meds:  acidophilus  1 capsule Oral Daily   amiodarone  400 mg Per Tube Daily   aspirin  325 mg Per Tube Daily   clopidogrel  75  mg Per Tube Daily   diclofenac Sodium  2 g Topical QID   famotidine  20 mg Per Tube Daily   feeding supplement (PROSource TF20)  60 mL Per Tube Daily   heparin  5,000 Units Subcutaneous Q8H   insulin aspart  0-9 Units Subcutaneous Q4H   insulin aspart  5 Units Subcutaneous TID WC   insulin glargine-yfgn  15 Units Subcutaneous Daily   lidocaine  1 patch Transdermal Q24H   lipase/protease/amylase  72,000 Units Oral Q6H   megestrol  400 mg Per Tube BID  mirabegron ER  25 mg Oral Daily   rosuvastatin  10 mg Per Tube Daily   testosterone  8 g Transdermal QHS   Continuous Infusions:  feeding supplement (JEVITY 1.5 CAL/FIBER) 1,000 mL (03/26/22 1911)   PRN Meds: acetaminophen, alum & mag hydroxide-simeth, diphenhydrAMINE, guaiFENesin-dextromethorphan, magnesium hydroxide, methocarbamol, prochlorperazine **OR** prochlorperazine **OR** prochlorperazine, sodium phosphate, sorbitol, traZODone, white petrolatum  Allergies:   No Known Allergies  Social History:   Social History   Socioeconomic History   Marital status: Married    Spouse name: Not on file   Number of children: 2   Years of education: Not on file   Highest education level: Not on file  Occupational History   Occupation: retired  Tobacco Use   Smoking status: Former    Packs/day: 3.00    Years: 20.00    Total pack years: 60.00    Types: Cigarettes    Quit date: 03/15/1977    Years since quitting: 45.0   Smokeless tobacco: Never   Tobacco comments:    quit 35 years ago  Vaping Use   Vaping Use: Never used  Substance and Sexual Activity   Alcohol use: Yes    Alcohol/week: 3.0 standard drinks of alcohol    Types: 3 Glasses of wine per week    Comment: approx 1 drink/night   Drug use: No   Sexual activity: Not Currently    Birth control/protection: None  Other Topics Concern   Not on file  Social History Narrative   Married, 2 children   Retired criminal Counsellor and [place]   1  drink/day   Social Determinants of Health   Financial Resource Strain: Not on file  Food Insecurity: No Food Insecurity (03/09/2022)   Hunger Vital Sign    Worried About Running Out of Food in the Last Year: Never true    Ran Out of Food in the Last Year: Never true  Transportation Needs: No Transportation Needs (03/09/2022)   PRAPARE - Hydrologist (Medical): No    Lack of Transportation (Non-Medical): No  Physical Activity: Not on file  Stress: Not on file  Social Connections: Not on file  Intimate Partner Violence: Not At Risk (03/09/2022)   Humiliation, Afraid, Rape, and Kick questionnaire    Fear of Current or Ex-Partner: No    Emotionally Abused: No    Physically Abused: No    Sexually Abused: No    Family History:    Family History  Problem Relation Age of Onset   Pancreatic cancer Father    Prostate cancer Father    Parkinsonism Mother    CAD Brother 64   Cancer Paternal Grandmother    Breast cancer Neg Hx    Colon cancer Neg Hx      ROS:  Constitutional: Denied fever, chills, malaise, night sweats Eyes: Denied vision change or loss Ears/Nose/Mouth/Throat: Denied ear ache, sore throat, coughing, sinus pain Cardiovascular: see HPI  Respiratory: see HPI  Gastrointestinal: Denied nausea, vomiting, abdominal pain, diarrhea Genital/Urinary: Denied dysuria, hematuria, urinary frequency/urgency Musculoskeletal: Denied muscle ache, joint pain, weakness Skin: Denied rash, wound Neuro: Denied headache, dizziness, syncope Psych: Denied history of depression/anxiety  Endocrine: Denied history of diabetes    Physical Exam/Data:   Vitals:   03/26/22 2053 03/27/22 0407 03/27/22 1137 03/27/22 1147  BP: (!) 107/40 (!) 92/56 (!) 83/53 (!) 111/47  Pulse: 80 76 79 80  Resp: 16 18 (!) 22 20  Temp: 97.7 F (36.5  C) 98.3 F (36.8 C) 98.3 F (36.8 C) 98 F (36.7 C)  TempSrc: Oral Oral Oral Oral  SpO2: 94% 97% 96% 99%  Weight:  54.3 kg     Height:        Intake/Output Summary (Last 24 hours) at 03/27/2022 1203 Last data filed at 03/27/2022 0800 Gross per 24 hour  Intake 720 ml  Output 287 ml  Net 433 ml      03/27/2022    4:07 AM 03/26/2022    5:00 AM 03/24/2022    4:23 AM  Last 3 Weights  Weight (lbs) 119 lb 11.4 oz 119 lb 14.9 oz 121 lb 4.1 oz  Weight (kg) 54.3 kg 54.4 kg 55 kg     Body mass index is 19.92 kg/m.   Vitals:  Vitals:   03/27/22 1137 03/27/22 1147  BP: (!) 83/53 (!) 111/47  Pulse: 79 80  Resp: (!) 22 20  Temp: 98.3 F (36.8 C) 98 F (36.7 C)  SpO2: 96% 99%   General Appearance: In no apparent distress, laying in bed, frail, oral cavity dry  HEENT: Normocephalic, atraumatic.  Neck: No JVD Cardiovascular: Regular rate and rhythm, normal S1-S2,  no murmur Respiratory: Resting breathing unlabored, lungs sounds clear to auscultation bilaterally, no use of accessory muscles. On room air.  No wheezes, rales or rhonchi.   Gastrointestinal: Bowel sounds positive, abdomen soft Extremities: Able to move all extremities in bed without difficulty, no edema of BLE  Musculoskeletal: Normal muscle bulk and tone Skin: Intact, warm, dry. No rashes or petechiae noted in exposed areas.  Neurologic: Alert, oriented to person, place and time. Minimally verbal, slow but appropriate response to questions  Psychiatric: Normal affect. Mood is appropriate.      EKG:  The EKG was personally reviewed and demonstrates:  N/A Telemetry:  Telemetry was personally reviewed and demonstrates:  N/A  Relevant CV Studies:  cMRI  On 03/12/22:  IMPRESSION: 1.  R>L base airspace disease as above.   2. Extensive hilar and mediastinal lymphadenopathy with compression of left atrium as noted above.   3. Mildly dilated LV with normal wall thickness. EF 33%, diffuse hypokinesis.   4.  Normal RV size with mild hypokinesis, EF 46%.   5.  Suspect mild aortic insuffiiciency and mitral regurgitation.   6. Predominantly  mid-wall LGE with some subendocardial component in the basal inferolateral wall and focally in the mid lateral wall. Pattern is not clearly due to prior MI but cannot rule this out given moderate CAD on cath. Alternatively could be due to prior myocarditis. T2 not elevated in the basal inferolateral wall, so do not suspect active inflammation.   7. Findings not consistent with cardiac AL amyloidosis. No significant LVH, extracellular volume percentage only mildly elevated, and LGE is not characteristic.   Right and left heart cath 03/11/22:    Dist RCA lesion is 60-70% stenosed.   2nd Diag lesion is 70% stenosed.   Mid LAD lesion is 60% stenosed.   Ost LAD to Prox LAD lesion is 25% stenosed.   Dist LAD lesion is 70% stenosed.   LV end diastolic pressure is normal.   There is no aortic valve stenosis.   On 4L [place]: Ao sat 93%, PA sat 45%, PA pressure 23/5, mean PA pressure 14 mm Hg; mean PCWP 10/12, mean PCWP 9 mm Hg; CO 3.22 L/min; CI 1.87.   Moderate focal disease.  Cardiomyopathy is out of proportion to degree of CAD.     LV  dysfunction with significant decrease in cardiac output.  Discussed with the wife and son.     Echo from 03/11/2022:  1. Left ventricular ejection fraction, by estimation, is 30 to 35%. The  left ventricle has moderately decreased function. The left ventricle  demonstrates regional wall motion abnormalities (see scoring  diagram/findings for description). Left ventricular   diastolic parameters are indeterminate.   2. Right ventricular systolic function is normal. The right ventricular  size is normal.   3. A small pericardial effusion is present. The pericardial effusion is  circumferential.   4. The mitral valve is normal in structure. Trivial mitral valve  regurgitation. No evidence of mitral stenosis.   5. The aortic valve was not well visualized.   6. The inferior vena cava is normal in size with greater than 50%  respiratory variability,  suggesting right atrial pressure of 3 mmHg.    Laboratory Data:  High Sensitivity Troponin:   Recent Labs  Lab 03/11/22 0539 03/11/22 0721  TROPONINIHS 152* 144*     Chemistry Recent Labs  Lab 03/22/22 1019 03/23/22 0651  NA 139 140  K 4.7 4.4  CL 108 110  CO2 24 21*  GLUCOSE 217* 191*  BUN 33* 34*  CREATININE 1.36* 1.30*  CALCIUM 8.6* 8.5*  GFRNONAA 53* 56*  ANIONGAP 7 9    Recent Labs  Lab 03/23/22 0651  PROT 6.1*  ALBUMIN 2.4*  AST 99*  ALT 121*  ALKPHOS 677*  BILITOT 0.3   Lipids No results for input(s): "CHOL", "TRIG", "HDL", "LABVLDL", "LDLCALC", "CHOLHDL" in the last 168 hours.  Hematology Recent Labs  Lab 03/22/22 1019 03/23/22 0651 03/25/22 1240  WBC 8.3 7.6 8.8  RBC 4.26 4.25 4.44  HGB 10.1* 9.9* 10.5*  HCT 33.0* 32.5* 34.5*  MCV 77.5* 76.5* 77.7*  MCH 23.7* 23.3* 23.6*  MCHC 30.6 30.5 30.4  RDW 18.6* 18.4* 18.8*  PLT 632* 566* 576*   Thyroid No results for input(s): "TSH", "FREET4" in the last 168 hours.  BNPNo results for input(s): "BNP", "PROBNP" in the last 168 hours.  DDimer No results for input(s): "DDIMER" in the last 168 hours.   Radiology/Studies:  No results found.   Assessment and Plan:   Hypotension Chronic systolic and diastolic heart failure - BP now 111/47, low to 83/83 earlier per nurse, no orthostatic VS checked per review, please check  - clinically euvolemic, slightly dry  - Cardiac MRI 03/12/22 showed LV EF 33% with diffuse hypokinesis, RV EF 46% predominantly mid-wall LGE with some subendocardial component in the basal inferolateral wall and focally in the mid lateral wall.  Pattern is not clearly due to prior MI but cannot rule this out given moderate CAD on cath. Alternatively could be due to prior myocarditis. T2 not elevated in the basal inferolateral wall, so do not suspect active inflammation. Findings NOT consistent with cardiac AL amyloidosis.  -GDMT: not on BB due to low output CHF, BP low at 17E systolic  on Entresto 08-14 twice daily, Farxiga 10 mg daily, and spironolactone 12.5 mg daily, all had been discontinued, he is slightly dizzy when standing up, will stop spironolactone , resume Entresto and Farixga today, trend BP   Nonobstructive CAD - chest pain maybe MUSK, noted recent cath 03/11/2022 with non-obstructive CAD  -Continue ASA, crestor -Outpatient EP follow-up  Hx of VT arrest  - on amiodarone     Risk Assessment/Risk Scores:   New York Heart Association (NYHA) Functional Class NYHA Class II   For questions  or updates, please contact [place] Please consult [email or web address] for contact info under    Signed, Margie Billet, NP  03/27/2022 12:03 PM As above, patient seen and examined.  Briefly he is a 77 year old male who with nonobstructive coronary disease, chronic combined systolic/diastolic congestive heart failure, status post recent ventricular tachycardia arrest, AL amyloidosis, multiple myeloma, history of prostate cancer, history of pancreatic cancer status post Whipple, diabetes mellitus, hyperlipidemia, ischemic CVA for evaluation of hypotension.  Patient admitted December 2023 following CVA.  Echocardiogram showed newly reduced LV function with ejection fraction 30 to 35%, small pericardial fusion, mild mitral regurgitation, mild aortic insufficiency and aortic stenosis.  Course was complicated by ventricular tachycardia arrest requiring 1 minute of chest compressions.  Cardiac catheterization March 11, 2022 revealed moderate nonobstructive coronary disease.  Cardiac MRI showed extensive adenopathy, ejection fraction 33%, mild RV dysfunction, mild aortic and mitral regurgitation and possible myocarditis.  Not consistent with cardiac amyloid.  Due to multiple comorbidities patient is a no CODE BLUE and felt not to be a good ICD candidate and LifeVest therefore not recommended.  He was transferred to the rehab service.  Today his blood pressure was in the 80s and  cardiology asked to evaluate.  Patient denies dyspnea, chest pain or syncope.  He does complain of weakness which has been chronic.  He has some dizziness with standing.  1 hypotension-patient's blood pressure tends to run low.  However he does have some dizziness with standing at times.  Would resume Entresto 24/26 twice daily and continue dapagliflozin.  Will discontinue spironolactone.  Note he is not volume overloaded on examination.  Follow blood pressure and adjust as needed.  2 nonischemic cardiomyopathy-as outlined we will continue Entresto and dapagliflozin but discontinue spironolactone.  He is not on a beta-blocker as he was having low output symptoms and his blood pressure is borderline.  Can consider as he recovers and as blood pressure allows.  3 coronary artery disease-continue aspirin, Plavix and Crestor.  4 status post ventricular tachycardia arrest-this is felt to be triggered bilateral scar versus myocarditis.  Amiodarone was recommended and patient is not felt to be a good ICD candidate.  He is also a no CODE BLUE.  5 prior CVA continue aspirin, Plavix and statin.  6 multiple myeloma, AL amyloidosis-followed at [facility].  7 history of prostate cancer/pancreatic cancer with Whipple  Kirk Ruths, MD

## 2022-03-27 NOTE — Progress Notes (Signed)
Physical Therapy Session Note  Patient Details  Name: William Barron MRN: [id number] Date of Birth: Jul 21, 1945  Today's Date: 03/27/2022 PT Individual Time: 0952-1030 PT Individual Time Calculation (min): 38 min   Short Term Goals: Week 1:  PT Short Term Goal 1 (Week 1): Patient will perform basic transfers with CGA >50% of the time. PT Short Term Goal 2 (Week 1): Patient will ambulate >25 ft consistently using LRAD. PT Short Term Goal 3 (Week 1): Patient will initiate stair training. Week 2:    Week 3:    Week 4:     Skilled Therapeutic Interventions/Progress Updates:    Thigh high Teds on when PT entered room. Donning shoes in bed with max assist from PT.   Supine BP 100/48  Sitting 59/38. HR 80 Severe s/s with pt reporting that he had temporary loss of vision while EOB.     Returned to supine and vision returned to baseline within 10 seconds and all other s/s dissipated Supine BP reassessed 93/68 and then reassessed after 2 min in supine 102/49. HR 72.    Pt performed scooting in bed to the L and towards [facility] with mod assist then max assist to improve positioning. Pt requesting drink max assist to drink with HOB elevated. Noted to have facial tremor while drinking while PT held cup for pt. Pt left supine in bed with call bell in reach. MD aware of severity of orthostatic hypotension.    Therapy Documentation Precautions:  Precautions Precautions: Fall, Other (comment) Precaution Comments: vision impaired, feeding tube Required Braces or Orthoses: Other Brace Other Brace: RLE AFO and shoes, pt and son think he does worse with AFO on (per chart) Restrictions Weight Bearing Restrictions: No  Vital Signs: see above Pain: Pain Assessment Pain Scale: Faces Faces Pain Scale: No hurt    Therapy/Group: Individual Therapy  Lorie Phenix 03/27/2022, 6:19 PM

## 2022-03-27 NOTE — Progress Notes (Signed)
Speech Language Pathology Daily Session Note  Patient Details  Name: William Barron MRN: [id number] Date of Birth: 1945-10-26  Today's Date: 03/27/2022 SLP Individual Time: 1345-1415 SLP Individual Time Calculation (min): 30 min  Short Term Goals: Week 1: SLP Short Term Goal 1 (Week 1): Patient will consume current diet with minimal overt s/s of aspiration with Min verbal cues for use of swallowing compensatory strategies. SLP Short Term Goal 2 (Week 1): Patient will demonstrate efficient mastication and complete oral clearance with trials of Dys. 3 textures without reports of a globus sensation over 2 sessions prior to upgrade. SLP Short Term Goal 3 (Week 1): Patient will demonstrate functional problem solving for basic and familiar tasks with Min verbal cues. SLP Short Term Goal 4 (Week 1): Patient will recall new, daily information with Mod verbal and visual cues for use of memory compensatory strategies. SLP Short Term Goal 5 (Week 1): Patient will self-monitor and correct errors duirng functional tasks with Min verbal cues. SLP Short Term Goal 6 (Week 1): Patient will demonstrate sustained attention to functional tasks for 15 minutes with Min verbal cues for redirection.  Skilled Therapeutic Interventions: Skilled intervention focused on dysphagia. Pt sleeping when St entered but able to wake up and agreeable to trials with dys 3 snack. He consumed graham cracker softened and dipped in pudding.Increased time needed with mastication but eventually was able to clear with no oral residue. He took sips of thin liquid from straw to increase oral clearance. Pt consumed dys 3 snack and 4 oz of thin liquid via straw with no overt s/sx of aspiration or penetration. Pt required orientation to date and time of day. Pt stated he thought it was 2 am and a Thursday. Cont therapy per plan of care.      Pain Pain Assessment Pain Scale: Faces Pain Score: 0-No pain Faces Pain Scale: No  hurt  Therapy/Group: Individual Therapy  Darrol Poke Natalie Mceuen 03/27/2022, 2:46 PM

## 2022-03-28 DIAGNOSIS — I639 Cerebral infarction, unspecified: Secondary | ICD-10-CM | POA: Diagnosis not present

## 2022-03-28 LAB — GLUCOSE, CAPILLARY
Glucose-Capillary: 137 mg/dL — ABNORMAL HIGH (ref 70–99)
Glucose-Capillary: 138 mg/dL — ABNORMAL HIGH (ref 70–99)
Glucose-Capillary: 157 mg/dL — ABNORMAL HIGH (ref 70–99)
Glucose-Capillary: 168 mg/dL — ABNORMAL HIGH (ref 70–99)
Glucose-Capillary: 176 mg/dL — ABNORMAL HIGH (ref 70–99)
Glucose-Capillary: 178 mg/dL — ABNORMAL HIGH (ref 70–99)
Glucose-Capillary: 86 mg/dL (ref 70–99)

## 2022-03-28 NOTE — Progress Notes (Signed)
Physical Therapy Session Note  Patient Details  Name: William Barron MRN: [id number] Date of Birth: [birth year]  Today's Date: 03/28/2022 PT Individual Time: 1000-1055 PT Individual Time Calculation (min): 55 min   Short Term Goals: Week 1:  PT Short Term Goal 1 (Week 1): Patient will perform basic transfers with CGA >50% of the time. PT Short Term Goal 2 (Week 1): Patient will ambulate >25 ft consistently using LRAD. PT Short Term Goal 3 (Week 1): Patient will initiate stair training.  Skilled Therapeutic Interventions/Progress Updates:     Patient in bed asleep with B thigh high TED hose donned upon PT arrival. Patient slow to arouse and agreeable to PT session. Patient denied pain during session.  Orthostatic Vitals: Supine: BP 100/59, HR 75 (asymptomatic) Sitting: BP 101/49, HR 74 (mild light headedness) Standing: BP 84/45, HR 86 (severe light headedness) Standing x3 min: unable to tolerate standing x3 min Sitting x5 min of recovery: BP 95/52, HR 79  Therapeutic Activity: Bed Mobility: Donned pants bed level with total A with patient performing bridging to bring pants over hips with min cues for technique. Patient performed supine to/from sit with supervision and use of hospital bed features. Provided verbal cues for initiation and reciprocal scooting to EOB. Donned B tennis shoes with total A for energy/time management with patient sitting EOB with supervision for sitting balance with flexed posture.  Transfers: Patient performed stand pivot bed>w/c and w/c>recliner with mod A using rolling walker with significantly crouched standing posture. Provided verbal cues for ,hand placement, erect posture, and facilitation management of RW during pivot.  Patient required increased time for initiation, vitals/symptoms assessment/management, rest breaks, and for completion of tasks throughout session. Utilized therapeutic use of self throughout to promote efficiency.   Patient in the  recliner with B lower extremities elevated positioned on Roho cushion with pillows on either side, behind his back, and under his legs at end of session with breaks locked, seat belt alarm set, and all needs within reach. Adjusted Roho cushion to comfort with ~1.5" space between ischial tuberosities and the seat.   Therapy Documentation Precautions:  Precautions Precautions: Fall, Other (comment) Precaution Comments: vision impaired, feeding tube Required Braces or Orthoses: Other Brace Other Brace: RLE AFO and shoes, pt and son think he does worse with AFO on (per chart) Restrictions Weight Bearing Restrictions: No    Therapy/Group: Individual Therapy  Addalynn Kumari L Zakariyya Helfman PT, DPT, NCS, CBIS  03/28/2022, 11:59 AM

## 2022-03-28 NOTE — Plan of Care (Signed)
  Problem: Consults Goal: RH STROKE PATIENT EDUCATION Description: See Patient Education module for education specifics  Outcome: Progressing   Problem: RH BLADDER ELIMINATION Goal: RH STG MANAGE BLADDER WITH ASSISTANCE Description: STG Manage Bladder With mod I Assistance Outcome: Progressing Goal: RH STG MANAGE BLADDER WITH MEDICATION WITH ASSISTANCE Description: STG Manage Bladder With Medication With mod I Assistance. Outcome: Progressing   Problem: RH SAFETY Goal: RH STG ADHERE TO SAFETY PRECAUTIONS W/ASSISTANCE/DEVICE Description: STG Adhere to Safety Precautions With cues Assistance/Device. Outcome: Progressing   Problem: RH PAIN MANAGEMENT Goal: RH STG PAIN MANAGED AT OR BELOW PT'S PAIN GOAL Description: < 4 with prns Outcome: Progressing   Problem: RH KNOWLEDGE DEFICIT Goal: RH STG INCREASE KNOWLEDGE OF DIABETES Description: Patient and son will be able to manage DM care at discharge with medications and dietary modifications using educational handouts and reference notes independently Outcome: Progressing Goal: RH STG INCREASE KNOWLEDGE OF DYSPHAGIA/FLUID INTAKE Description: Patient and son will be able to manage Dysphagia, care at discharge,  medications and dietary modifications using educational handouts and reference notes independently Outcome: Progressing Goal: RH STG INCREASE KNOWLEGDE OF HYPERLIPIDEMIA Description: Patient and son will be able to manage HLD, care at discharge with medications and dietary modifications using educational handouts and reference notes independently Outcome: Progressing Goal: RH STG INCREASE KNOWLEDGE OF STROKE PROPHYLAXIS Description: Patient and son will be able to manage secondary risks at discharge with medications and dietary modifications using educational handouts and reference notes independently Outcome: Progressing   Problem: Education: Goal: Understanding of CV disease, CV risk reduction, and recovery process will  improve Outcome: Progressing Goal: Individualized Educational Video(s) Outcome: Progressing   Problem: Activity: Goal: Ability to return to baseline activity level will improve Outcome: Progressing   Problem: Cardiovascular: Goal: Ability to achieve and maintain adequate cardiovascular perfusion will improve Outcome: Progressing Goal: Vascular access site(s) Level 0-1 will be maintained Outcome: Progressing   Problem: Health Behavior/Discharge Planning: Goal: Ability to safely manage health-related needs after discharge will improve Outcome: Progressing   Problem: Education: Goal: Knowledge of disease or condition will improve Outcome: Progressing Goal: Knowledge of secondary prevention will improve (MUST DOCUMENT ALL) Outcome: Progressing Goal: Knowledge of patient specific risk factors will improve Elta [place] N/A or DELETE if not current risk factor) Outcome: Progressing   Problem: Ischemic Stroke/TIA Tissue Perfusion: Goal: Complications of ischemic stroke/TIA will be minimized Outcome: Progressing   Problem: Coping: Goal: Will verbalize positive feelings about self Outcome: Progressing Goal: Will identify appropriate support needs Outcome: Progressing   Problem: Health Behavior/Discharge Planning: Goal: Ability to manage health-related needs will improve Outcome: Progressing Goal: Goals will be collaboratively established with patient/family Outcome: Progressing   Problem: Self-Care: Goal: Ability to participate in self-care as condition permits will improve Outcome: Progressing Goal: Verbalization of feelings and concerns over difficulty with self-care will improve Outcome: Progressing Goal: Ability to communicate needs accurately will improve Outcome: Progressing   Problem: Nutrition: Goal: Risk of aspiration will decrease Outcome: Progressing Goal: Dietary intake will improve Outcome: Not Progressing Note: Patient does not do well with semi solid foods and  takes patients a while to even swallow applesauce. Patient has no coughing with with swallowing. Patient does well with liquids.

## 2022-03-28 NOTE — Progress Notes (Signed)
Manufacturing engineer [facility]) Hospital Liaison Note  Referral received for patient/family interest in hospice at [facility]). Chart under review by [facility] physician.   Hospice eligibility pending.   Discharge date is unknown at this time.   No DME needed at this time.   Please send comfort medications/prescriptions home with patient at discharge.   Please call with any questions or concerns. Thank you  Roselee Nova, [facility] Liaison [phone]

## 2022-03-28 NOTE — Progress Notes (Signed)
PROGRESS NOTE    Subjective/Complaints:  Pt reports had severe dizziness with standing/orthostatics today and yesterday-  Spoke with nursing- got pt to drink another 16 oz water in addition to 1200cc fluid restriction.   Wants to brush teeth, get up/OOB; LBM yesterday.   Said had some issues getting nursing overnight.    ROS: Patient denies CP, SOB, N/V/D   Objective:   No results found. No results for input(s): "WBC", "HGB", "HCT", "PLT" in the last 72 hours. No results for input(s): "NA", "K", "CL", "CO2", "GLUCOSE", "BUN", "CREATININE", "CALCIUM" in the last 72 hours.   Intake/Output Summary (Last 24 hours) at 03/28/2022 1256 Last data filed at 03/28/2022 1237 Gross per 24 hour  Intake 418 ml  Output 750 ml  Net -332 ml        Physical Exam: Vital Signs Blood pressure (!) 109/57, pulse 73, temperature 98.3 F (36.8 C), temperature source Oral, resp. rate 18, height '5\' 5"'$  (1.651 m), weight 53.5 kg, SpO2 97 %.   General: awake, alert, appropriate, NAD HENT: conjugate gaze; oropharynx dry- cortrak in place;  CV: regular rate and rhythm; no JVD Pulmonary: CTA B/L; no W/R/R- good air movement GI: soft, NT, ND, (+)BS- normoactive BS Psychiatric: appropriate Neurological: Ox3 Neuro:  alert, oriented to person, place, reason, missed day of week by one. Fair awareness. Follows commands. STM deficits. Speech dysarthric but improving. Right CN VI palsy. RUE 4+, LUE 4/5. RLE 4/5 prox with foot drop distally. LLE 4/5.   Musculoskeletal: no pain with AROm/PROM    Assessment/Plan: 1. Functional deficits which require 3+ hours per day of interdisciplinary therapy in a comprehensive inpatient rehab setting. Physiatrist is providing close team supervision and 24 hour management of active medical problems listed below. Physiatrist and rehab team continue to assess barriers to discharge/monitor patient progress toward  functional and medical goals  Care Tool:  Bathing    Body parts bathed by patient: Right arm, Left arm, Chest, Abdomen, Front perineal area, Right upper leg, Left upper leg, Face   Body parts bathed by helper: Buttocks, Right lower leg, Left lower leg     Bathing assist Assist Level: Moderate Assistance - Patient 50 - 74%     Upper Body Dressing/Undressing Upper body dressing   What is the patient wearing?: Pull over shirt    Upper body assist Assist Level: Minimal Assistance - Patient > 75%    Lower Body Dressing/Undressing Lower body dressing      What is the patient wearing?: Pants     Lower body assist Assist for lower body dressing: Maximal Assistance - Patient 25 - 49%     Toileting Toileting    Toileting assist Assist for toileting: Maximal Assistance - Patient 25 - 49%     Transfers Chair/bed transfer  Transfers assist     Chair/bed transfer assist level: Minimal Assistance - Patient > 75% Chair/bed transfer assistive device: Programmer, multimedia   Ambulation assist      Assist level: Moderate Assistance - Patient 50 - 74% Assistive device: Walker-rolling Max distance: 12 ft   Walk 10 feet activity   Assist     Assist level: Moderate Assistance -  Patient - 50 - 74% Assistive device: Walker-rolling   Walk 50 feet activity   Assist Walk 50 feet with 2 turns activity did not occur: Safety/medical concerns         Walk 150 feet activity   Assist Walk 150 feet activity did not occur: Safety/medical concerns         Walk 10 feet on uneven surface  activity   Assist Walk 10 feet on uneven surfaces activity did not occur: Safety/medical concerns         Wheelchair     Assist Is the patient using a wheelchair?: Yes Type of Wheelchair: Manual    Wheelchair assist level: Supervision/Verbal cueing Max wheelchair distance: 150 ft    Wheelchair 50 feet with 2 turns activity    Assist        Assist  Level: Supervision/Verbal cueing   Wheelchair 150 feet activity     Assist      Assist Level: Supervision/Verbal cueing   Blood pressure (!) 109/57, pulse 73, temperature 98.3 F (36.8 C), temperature source Oral, resp. rate 18, height '5\' 5"'$  (1.651 m), weight 53.5 kg, SpO2 97 %.  Medical Problem List and Plan: 1. Functional deficits secondary to right occipital infarct d/t right PCA stenosis/occlusion, multiple medical conditions             -patient may shower             -ELOS/Goals: 14-17 days, supervision to min assist with PT and OT, supervision to mod I with SLP.   Con't CIR- PT, OT and SLP 15/7  2.  Antithrombotics: -DVT/anticoagulation:  Pharmaceutical: Heparin             -antiplatelet therapy: Aspirin 325 mg daily plus Plavix 75 mg daily for 3 months then aspirin alone   3. Pain Management: Tylenol, Lidoderm as needed             -Voltaren gel   4. Mood/Behavior/Sleep: LCSW to evaluate and provide emotional support             -antipsychotic agents: n/a   5. Neuropsych/cognition: This patient is capable of making decisions on his own behalf.   6. Skin/Wound Care: Routine skin care checks   7. Fluids/Electrolytes/Nutrition:   -intake inconsistent yesterday--encouraged pt to pick up po for cortrak removal             -continue tube feeds via Cortrak for now due to malnutrition             -continue Megace --> increased to 400 mg bid             -continue Prosource daily             -Dysphagia 2 diet, thin liquids; advance per SLP recs             -Continue pancreatic enzymes q 6 hours             -continue probiotics             -continue Protonix for GI prophylaxis   1/11- cut back TF  to 30cc/hr and only run at night  1/13 eating 25-50+% but needs encouragement and cueing at times  1/14- con't Cortrak- not eating enough still 8: VT arrest: Likely due to prior MI versus myocarditis status post EP eval             -follow-up with cardiology (meds as per #10)              -  no plan now for life vest/ICD 9: CAD status post left heart catheterization             -Continue aspirin and statin   10: Acute on chronic systolic heart failure: currently not requiring diuretic             -Daily weight             -Continue Entresto 24-26 mg BID             -Continue dapagliflozin 10 mg daily             -Continue spironolactone 12.5 mg daily             -continue amiodarone 400 mg BID             -continue carvedilol 3.125 mg BID   Filed Weights   03/26/22 0500 03/27/22 0407 03/28/22 0619  Weight: 54.4 kg 54.3 kg 53.5 kg    1/14- weight down slightly 1 kg- could be due to dehydration- will address as per #27 11: Cardiomyopathy: Likely due to myocarditis versus prior MI   12: Bilateral pneumonia; likely aspiration pneumonitis             -completed antibiotics             -CT chest 12/30: R>L bronchopneumonia, chronic sarcoidosis   13: Intraductal papillary mucinous neoplasms of the pancreas status post Whipple procedure 2023             -Continue pancreatic enzymes             -Follow-up with [name]   14: Multiple myeloma with AL amyloidosis: stable; follow-up [name]   15: Influenza A: Status post oseltamivir x 5 days this admission   16: DM: continue CBGs q AC and q HS             -continue SSI             -continue Novolog 5 units with meals               CBG (last 3)  Recent Labs    03/28/22 0355 03/28/22 0731 03/28/22 1131  GLUCAP 157* 168* 137*    1/12 Semglee reduced back to 15u daily since I cut TF in half---continue for now  1/14- CBGs 91-178 in last 24 hours- doing better- con't reigmen 17: Oral thrush: will not change to diflucan given TP risk, complex CV hx  -continue nystating as rx'ed   18: OAB: continue Myrbetriq   19: Hyperlipidemia: continue Crestor 10 mg   20: Low testosterone: continue Androgel q HS  -pt receiving now   21: Right foot drop/R common peroneal neuropathy: continue AFO   22: Hard of hearing:  wears hearing aids   23: Right parotid gland lesion seen incidentally on MRI of brain             -ENT referral as outpatient   24: Right cranial nerve VI palsy: continue right eye patch   25: Code status: DNR as above  26. Gross blood in stool:  -two episodes (wed afternoon and Thursday morning --which wasn't recorded--)  -hgb stable  -continue to monitor only for now  -cbc monday   27.  Orthostatic hypotension- likley volume depletion plus diuretics nd antihypertensive, hx of CHF with EF of 35%,  will ask cardiology to eval   1/14- they stopped Aldactone, but restarted Entresto and Farixga- pt's SEVERELY dizzy with standing and BP low 80s/50s  with standing per OT note- will have nursing give 16 oz water in addition to regular 1200cc fluid restriction- if doesn't improve, might need to start Midodrine for orthostatic hypotension.   I spent a total of 52   minutes on total care today- >50% coordination of care- due to d/w PT as well as nursing about pt's hypotension- trying to decide if should add Midodrine- also called Cards to discuss Midodrine-  Per d/w Dr Debara Pickett- stop Delene Loll and see what happens with BP /orthostatic hypotension- will also change fluid restriction to 1500cc/day as well per Cards and see how he does over the next 24-48 hours.    LOS: 6 days A FACE TO FACE EVALUATION WAS PERFORMED  Delsy Etzkorn 03/28/2022, 12:56 PM

## 2022-03-28 NOTE — Progress Notes (Signed)
Occupational Therapy Session Note  Patient Details  Name: William Barron MRN: [id number] Date of Birth: 09-Sep-1945  Today's Date: 03/28/2022 OT Individual Time: 6440-3474 and 1300-1343 OT Individual Time Calculation (min): 40 min and 43 min   Short Term Goals: Week 1:  OT Short Term Goal 1 (Week 1): Pt iwll transfer to toilet wiht LRAD and MIN A OT Short Term Goal 2 (Week 1): Pt will locate all needed ADL items with no cuing to demo improved vision/compensatory strategies OT Short Term Goal 3 (Week 1): Pt will complete dressing sit to stand wiht MIN A only for balance   Skilled Therapeutic Interventions/Progress Updates:    Session 1: Pt bed level at time of session no reports of pain, BP checks with the following: Supine no TEDS: 100/59 Supine with TEDS after rolling: 117/57 Sitting EOB: 99/49 OT donning TEDS dependent bed level, rolling L/R with MIN A for brief change dependently for brief and hygiene. Supine > sit MOD A and sitting EOB pt initially attempting to brush teeth at EOB but quickly becoming lightheaded and difficulty sitting up, returned to supine. Note BP checked and WNL but pt had been laying down and unable to get accurate reading. Pt scooting up in bed with cues for hand/foot placement, MIN A. Oral hygiene with MIN A and face washing supervision. Extended time for all tasks 2/2 decreased sequencing and processing. Alarm on call bell in reach.   Session 2: Pt bed level resting at time of session but agreeable, no pain. Pt asking about lunch, lunch arriving during session. BP checks as follows: Supine w/ TEDS: 99/52 Sitting EOB: 91/47 Standing: attempted but only able to stand <30 seconds with severely hunches posture, unable to obtain BP in standing but returned to sitting and BP 81/49 with reports of "very dizzy." Bed mobility MOD A supine <> sit and sit > stand same manner at MOD A. Pt motivated to attempt but limited by [facility] and fatigue. Pt sitting EOB while OT  provided full supervision to eat soup, did so with straw per pt request with supervision. Supine rest break approx 2-3 mins before activity. Returned to sitting MIN A this attempt and sitting EOB for oral hygiene per pt request, CGA and cues for problem solving. Returned self to supine, scoot up with MIN A. Needing to toilet at this time, used urinal with assist and encouraged pt to do clothing management. Alarm on call bell in reach.    Therapy Documentation Precautions:  Precautions Precautions: Fall, Other (comment) Precaution Comments: vision impaired, feeding tube Required Braces or Orthoses: Other Brace Other Brace: RLE AFO and shoes, pt and son think he does worse with AFO on (per chart) Restrictions Weight Bearing Restrictions: No    Therapy/Group: Individual Therapy  Viona Gilmore 03/28/2022, 7:22 AM

## 2022-03-29 DIAGNOSIS — I639 Cerebral infarction, unspecified: Secondary | ICD-10-CM | POA: Diagnosis not present

## 2022-03-29 DIAGNOSIS — I5023 Acute on chronic systolic (congestive) heart failure: Secondary | ICD-10-CM | POA: Diagnosis not present

## 2022-03-29 DIAGNOSIS — E119 Type 2 diabetes mellitus without complications: Secondary | ICD-10-CM | POA: Diagnosis not present

## 2022-03-29 DIAGNOSIS — B370 Candidal stomatitis: Secondary | ICD-10-CM | POA: Diagnosis not present

## 2022-03-29 LAB — CBC
HCT: 28.1 % — ABNORMAL LOW (ref 39.0–52.0)
HCT: 30.6 % — ABNORMAL LOW (ref 39.0–52.0)
Hemoglobin: 8.4 g/dL — ABNORMAL LOW (ref 13.0–17.0)
Hemoglobin: 9.3 g/dL — ABNORMAL LOW (ref 13.0–17.0)
MCH: 23.3 pg — ABNORMAL LOW (ref 26.0–34.0)
MCH: 23.7 pg — ABNORMAL LOW (ref 26.0–34.0)
MCHC: 29.9 g/dL — ABNORMAL LOW (ref 30.0–36.0)
MCHC: 30.4 g/dL (ref 30.0–36.0)
MCV: 78.1 fL — ABNORMAL LOW (ref 80.0–100.0)
MCV: 78.1 fL — ABNORMAL LOW (ref 80.0–100.0)
Platelets: 393 10*3/uL (ref 150–400)
Platelets: 426 10*3/uL — ABNORMAL HIGH (ref 150–400)
RBC: 3.6 MIL/uL — ABNORMAL LOW (ref 4.22–5.81)
RBC: 3.92 MIL/uL — ABNORMAL LOW (ref 4.22–5.81)
RDW: 19.9 % — ABNORMAL HIGH (ref 11.5–15.5)
RDW: 20.2 % — ABNORMAL HIGH (ref 11.5–15.5)
WBC: 6.2 10*3/uL (ref 4.0–10.5)
WBC: 7.8 10*3/uL (ref 4.0–10.5)
nRBC: 0 % (ref 0.0–0.2)
nRBC: 0 % (ref 0.0–0.2)

## 2022-03-29 LAB — BASIC METABOLIC PANEL
Anion gap: 8 (ref 5–15)
BUN: 44 mg/dL — ABNORMAL HIGH (ref 8–23)
CO2: 22 mmol/L (ref 22–32)
Calcium: 8.3 mg/dL — ABNORMAL LOW (ref 8.9–10.3)
Chloride: 107 mmol/L (ref 98–111)
Creatinine, Ser: 1.65 mg/dL — ABNORMAL HIGH (ref 0.61–1.24)
GFR, Estimated: 42 mL/min — ABNORMAL LOW (ref >60)
Glucose, Bld: 152 mg/dL — ABNORMAL HIGH (ref 70–99)
Potassium: 4.8 mmol/L (ref 3.5–5.1)
Sodium: 137 mmol/L (ref 135–145)

## 2022-03-29 LAB — GLUCOSE, CAPILLARY
Glucose-Capillary: 148 mg/dL — ABNORMAL HIGH (ref 70–99)
Glucose-Capillary: 140 mg/dL — ABNORMAL HIGH (ref 70–99)
Glucose-Capillary: 171 mg/dL — ABNORMAL HIGH (ref 70–99)

## 2022-03-29 MED ORDER — OXYCODONE HCL 20 MG/ML PO CONC: 10.0000 mg | ORAL | Status: DC | PRN

## 2022-03-29 MED ORDER — JEVITY 1.5 CAL/FIBER PO LIQD
1000.0000 mL | ORAL | Status: DC
  Filled 2022-03-29: qty 1000

## 2022-03-29 MED ORDER — PANCRELIPASE (LIP-PROT-AMYL) 36000-114000 UNITS PO CPEP
72000.0000 [IU] | ORAL_CAPSULE | Freq: Three times a day (TID) | ORAL | Status: DC | PRN
  Administered 2022-03-29: 72000 [IU] via ORAL
  Filled 2022-03-29: qty 2

## 2022-03-29 MED ORDER — OXYCODONE HCL 5 MG/5ML PO SOLN: 5.0000 mg | ORAL | Status: DC | PRN

## 2022-03-29 MED ORDER — PHENOBARBITAL 20 MG/5ML PO ELIX: 20.0000 mg | ORAL_SOLUTION | Freq: Four times a day (QID) | ORAL | Status: DC | PRN

## 2022-03-29 MED ORDER — FREE WATER: 200.0000 mL | Freq: Two times a day (BID) | Status: DC

## 2022-03-29 MED ORDER — AMIODARONE HCL 200 MG PO TABS
400.0000 mg | ORAL_TABLET | Freq: Every day | ORAL | Status: DC
  Administered 2022-03-30 – 2022-03-31 (×2): 400 mg via ORAL
  Filled 2022-03-29 (×3): qty 2

## 2022-03-29 MED ORDER — ACETAMINOPHEN 160 MG/5ML PO SOLN: 650.0000 mg | ORAL | Status: DC | PRN
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
## 2022-03-29 NOTE — Progress Notes (Signed)
Occupational Therapy Session Note  Patient Details  Name: William Barron MRN: [id number] Date of Birth: Jun 11, 1945  Today's Date: 03/29/2022 OT Individual Time: 1500-1550 OT Individual Time Calculation (min): 50 min    Short Term Goals: Week 1:  OT Short Term Goal 1 (Week 1): Pt iwll transfer to toilet wiht LRAD and MIN A OT Short Term Goal 2 (Week 1): Pt will locate all needed ADL items with no cuing to demo improved vision/compensatory strategies OT Short Term Goal 3 (Week 1): Pt will complete dressing sit to stand wiht MIN A only for balance  Skilled Therapeutic Interventions/Progress Updates:    Patient agreeable to participate in OT session. Reports 0/10 pain level although throughout session frequently needed to lay down in bed versus sitting on edge due to pain in buttocks from sitting too long. Declined pain medication from nursing when she stopped in during session to inquire.   Patient participated in skilled OT session focusing on activity tolerance and ADL re-training while participating in grooming task sitting on EOB. Therapist integrated self care task of shaving with electric razor while sitting on EOB focusing on activity tolerance and sitting balance in order to improve participation and functional performance during self care tasks.  Pt declined transferring into w/c to complete grooming task at sink and requested supplies be brought to him in bed. VC were provided to remain on task as occasional distraction when demonstrated; more during supine rest breaks. Pt required re-orientation of staff member present as he frequently addressed her with his wife's name. Greater than average time was needed to complete grooming task. Pt verbalized need to urinate. Was provided urinal and requested to complete task supine. Pt was unable to manage urinal, pants, and penis efficiently and urine did not make it into the urinal. OT provided pt manual assistance to manage urinal correctly. Pt  provided with total assist for hygiene for toileting, pants management, and bedding change. Pt was repositioned in bed using boost feature. Pt provided with electric razor to continue shaving after OT left. Nursing ok'd pt to have a cup of ice.    Therapy Documentation Precautions:  Precautions Precautions: Fall, Other (comment) Precaution Comments: vision impaired, feeding tube Required Braces or Orthoses: Other Brace Other Brace: RLE AFO and shoes, pt and son think he does worse with AFO on (per chart) Restrictions Weight Bearing Restrictions: No  Therapy/Group: Individual Therapy  Ailene Ravel, OTR/L,CBIS  Supplemental OT - [facility] and [facility] Secure Chat Preferred   03/29/2022, 7:58 AM

## 2022-03-29 NOTE — Progress Notes (Addendum)
PROGRESS NOTE    Subjective/Complaints:  Pt eating breakfast with SLP. Asked again when NGT was coming out. Buttock painful from sitting in bed.   ROS: Limited due to cognitive/behavioral    Objective:   No results found. Recent Labs    03/29/22 0543  WBC 6.2  HGB 8.4*  HCT 28.1*  PLT 393   Recent Labs    03/29/22 0543  NA 137  K 4.8  CL 107  CO2 22  GLUCOSE 152*  BUN 44*  CREATININE 1.65*  CALCIUM 8.3*     Intake/Output Summary (Last 24 hours) at 03/29/2022 0900 Last data filed at 03/29/2022 0600 Gross per 24 hour  Intake 278 ml  Output 350 ml  Net -72 ml        Physical Exam: Vital Signs Blood pressure 112/62, pulse 72, temperature 98.1 F (36.7 C), temperature source Oral, resp. rate 18, height '5\' 5"'$  (1.651 m), weight 53.4 kg, SpO2 100 %.   Constitutional: No distress . Vital signs reviewed. HEENT: NCAT, EOMI, oral membranes moist Neck: supple Cardiovascular: RRR without murmur. No JVD    Respiratory/Chest: CTA Bilaterally without wheezes or rales. Normal effort    GI/Abdomen: BS +, non-tender, non-distended Ext: no clubbing, cyanosis, or edema Psych: pleasant and cooperative  Skin: scattered bruises Neuro:  alert, oriented to person, place, reason, missed day of week by one. Fair awareness. Follows commands. STM deficits. Speech dysarthric but improving. Right CN VI palsy. RUE 4+, LUE 4/5. RLE 4/5 prox with foot drop distally. LLE 4/5.   Musculoskeletal: no pain with AROm/PROM    Assessment/Plan: 1. Functional deficits which require 3+ hours per day of interdisciplinary therapy in a comprehensive inpatient rehab setting. Physiatrist is providing close team supervision and 24 hour management of active medical problems listed below. Physiatrist and rehab team continue to assess barriers to discharge/monitor patient progress toward functional and medical goals  Care Tool:  Bathing    Body  parts bathed by patient: Right arm, Left arm, Chest, Abdomen, Front perineal area, Right upper leg, Left upper leg, Face   Body parts bathed by helper: Buttocks, Right lower leg, Left lower leg     Bathing assist Assist Level: Moderate Assistance - Patient 50 - 74%     Upper Body Dressing/Undressing Upper body dressing   What is the patient wearing?: Pull over shirt    Upper body assist Assist Level: Minimal Assistance - Patient > 75%    Lower Body Dressing/Undressing Lower body dressing      What is the patient wearing?: Pants     Lower body assist Assist for lower body dressing: Maximal Assistance - Patient 25 - 49%     Toileting Toileting    Toileting assist Assist for toileting: Maximal Assistance - Patient 25 - 49%     Transfers Chair/bed transfer  Transfers assist     Chair/bed transfer assist level: Minimal Assistance - Patient > 75% Chair/bed transfer assistive device: Programmer, multimedia   Ambulation assist      Assist level: Moderate Assistance - Patient 50 - 74% Assistive device: Walker-rolling Max distance: 12 ft   Walk 10 feet activity   Assist  Assist level: Moderate Assistance - Patient - 50 - 74% Assistive device: Walker-rolling   Walk 50 feet activity   Assist Walk 50 feet with 2 turns activity did not occur: Safety/medical concerns         Walk 150 feet activity   Assist Walk 150 feet activity did not occur: Safety/medical concerns         Walk 10 feet on uneven surface  activity   Assist Walk 10 feet on uneven surfaces activity did not occur: Safety/medical concerns         Wheelchair     Assist Is the patient using a wheelchair?: Yes Type of Wheelchair: Manual    Wheelchair assist level: Supervision/Verbal cueing Max wheelchair distance: 150 ft    Wheelchair 50 feet with 2 turns activity    Assist        Assist Level: Supervision/Verbal cueing   Wheelchair 150 feet  activity     Assist      Assist Level: Supervision/Verbal cueing   Blood pressure 112/62, pulse 72, temperature 98.1 F (36.7 C), temperature source Oral, resp. rate 18, height '5\' 5"'$  (1.651 m), weight 53.4 kg, SpO2 100 %.  Medical Problem List and Plan: 1. Functional deficits secondary to right occipital infarct d/t right PCA stenosis/occlusion, multiple medical conditions             -patient may shower             -ELOS/Goals: 14-17 days, supervision to min assist with PT and OT, supervision to mod I with SLP.   -Continue CIR therapies including PT, OT, and SLP 15/7. Hospice referral submitted, I assume by wife? 2.  Antithrombotics: -DVT/anticoagulation:  Pharmaceutical: Heparin             -antiplatelet therapy: Aspirin 325 mg daily plus Plavix 75 mg daily for 3 months then aspirin alone   3. Pain Management: Tylenol, Lidoderm as needed             -Voltaren gel   4. Mood/Behavior/Sleep: LCSW to evaluate and provide emotional support             -antipsychotic agents: n/a   5. Neuropsych/cognition: This patient is capable of making decisions on his own behalf.   6. Skin/Wound Care: Routine skin care checks   7. Fluids/Electrolytes/Nutrition:   -intake inconsistent yesterday--encouraged pt to pick up po for cortrak removal             -continue tube feeds via Cortrak for now due to malnutrition             -continue Megace --> increased to 400 mg bid             -continue Prosource daily             -Dysphagia 2 diet, thin liquids; advance per SLP recs             -Continue pancreatic enzymes q 6 hours             -continue probiotics             -continue Protonix for GI prophylaxis   1/15 not eating much despite reducing cortrak feeding  -resume 60cc/hr feeds for now  -need to decide plan for care. Will d/ wife. Hospice involved now as above. May re-engage Palliative care.  8: VT arrest: Likely due to prior MI versus myocarditis status post EP eval              -  follow-up with cardiology (meds as per #10)             - no plan now for life vest/ICD 9: CAD status post left heart catheterization             -Continue aspirin and statin   10: Acute on chronic systolic heart failure: currently not requiring diuretic             - Entresto 24-26 mg BID stopped.             -Continue dapagliflozin 10 mg daily             -  spironolactone 12.5 mg daily stopped             -continue amiodarone 400 mg BID             -  carvedilol 3.125 mg BID stopped   Filed Weights   03/27/22 0407 03/28/22 0619 03/29/22 0443  Weight: 54.3 kg 53.5 kg 53.4 kg    1/15 weights stable     -more volume depleted by labs today     -will give some extra fluid thru NGT, hold FR for now    -recheck labs in morning     -cards f/u today? 11: Cardiomyopathy: Likely due to myocarditis versus prior MI   12: Bilateral pneumonia; likely aspiration pneumonitis             -completed antibiotics             -CT chest 12/30: R>L bronchopneumonia, chronic sarcoidosis   13: Intraductal papillary mucinous neoplasms of the pancreas status post Whipple procedure 2023             -Continue pancreatic enzymes             -Follow-up with [name]   14: Multiple myeloma with AL amyloidosis: stable; follow-up [name]   15: Influenza A: Status post oseltamivir x 5 days this admission   16: DM: continue CBGs q AC and q HS             -continue SSI             -continue Novolog 5 units with meals               CBG (last 3)  Recent Labs    03/28/22 2356 03/29/22 0344 03/29/22 0744  GLUCAP 176* 148* 140*    1/12 Semglee reduced back to 15u daily since I cut TF in half---continue for now  1/15 cbg's controlled 17: Oral thrush: will not change to diflucan given TP risk, complex CV hx  -continue nystating as rx'ed   18: OAB: continue Myrbetriq   19: Hyperlipidemia: continue Crestor 10 mg   20: Low testosterone: continue Androgel q HS  -pt receiving now   21: Right foot drop/R  common peroneal neuropathy: continue AFO   22: Hard of hearing: wears hearing aids   23: Right parotid gland lesion seen incidentally on MRI of brain             -ENT referral as outpatient   24: Right cranial nerve VI palsy: continue right eye patch   25: Code status: DNR as above  26. Gross blood in stool:  -two episodes (wed afternoon and Thursday morning --which wasn't recorded--)  -hgb stable  -continue to monitor only for now  -hgb down to 8 today. Had another stool with BRB after I saw him. Will recheck hgb today. Need  to decide whether he is a candidate to transfuse given hospice discussion.    27.  Orthostatic hypotension- see volume discussion above      LOS: 7 days A FACE TO FACE EVALUATION WAS PERFORMED  Meredith Staggers 03/29/2022, 9:00 AM

## 2022-03-29 NOTE — Progress Notes (Signed)
William Barron 9V94 [facility])  Hospital Liaison Note   Family meeting at bedside with patient, wife, family friend and Dr. Hilma Favors from [name] team.   Plan remains for patient to discharge to [facility] once a bed is avaialble.   Hospice liaison will continue to follow and remain avaiable for any questions or concerns.    Thank you for the opportunity to participate in this patient's plan of care.   William Barron, BSN, RN Hospice hospital liaison [phone]

## 2022-03-29 NOTE — Consult Note (Signed)
Palliative Care Consultation Note- CIR  William Barron has multiple serious medical problems and complications of multiple myeloma and a whipple procedure on 5/23 -since then he has had many hospitalizations and life threatening medical conditions.He is irreversibly and chronically critically ill. He reached a point where continuing to push ahead with aggressive medical interventions and suffering is not desired and now realizes that the recovery he hoped for is not possible.  I met with the patient, his wife, William Barron from [place] and William Barron a close friend. Goals are clearly for comfort care-his suffering and decline are evident.  Recommendations and Plan:  DNR, comfort measures only. Family clear they desire placement at [facility] with [facility]osp[facility] managing him there. I believe this is a good care plan-if his condition changes or he has increased symptom management needs we can discuss moving him to a hospice IPU.I have reached out to [place] work to determine status of this request. [facility] hospice aware of plan and will follow closely and assume care at facility. I completed a medication reconciliation and discontinued any medications not directly related to his comfort.  Discontinued any therapy services at their request and will allow him to rest. Encouraged PO intake as tolerated. His pain and symptom management medications are in intensol SL form. If he has difficult taking pills should hold. For his comfort I have changed CBG to QHS only with single dose of long acting -w 120 parameter for holding. No need for tight CBG control as long as his range is not symptomatic. Offload pressure on his sacrum, we positioned him on his side for relief of wound pain. Palliative wound care, Needs a pressure relieving mattress.  Await communication from [place] on [place] bed request.   Lane Hacker, DO Palliative Medicine   Time: 50 minutes. Care plan discussed with medical and nursing  team.

## 2022-03-29 NOTE — Progress Notes (Signed)
Nutrition Follow-up  DOCUMENTATION CODES:   Not applicable  INTERVENTION:  - No nutritional interventions at this time.   NUTRITION DIAGNOSIS:   Inadequate oral intake related to poor appetite, dysphagia as evidenced by meal completion < 25%.  GOAL:   Patient will meet greater than or equal to 90% of their needs  MONITOR:   PO intake, Supplement acceptance, TF tolerance  REASON FOR ASSESSMENT:   New TF    ASSESSMENT:   77 y.o. male admits to CIR related to functional deficits secondary to right PCA territory stroke. PMH includes: amyloidosis, multiple myeloma, prostate cancer, pancreatic cancer, s/p whipple, dyslipidemia, IDDM.  Meds reviewed: risaquad, pepcid, sliding scale insulin, semglee (15 units), creon, megace. Labs reviewed.   MD reports that the pt will no longer be receiving TF and has transitioned to hospice.   Please re-consult if POC changes.   Diet Order:   Diet Order             DIET DYS 2 Room service appropriate? Yes with Assist; Fluid consistency: Thin  Diet effective now                   EDUCATION NEEDS:   Not appropriate for education at this time  Skin:  Skin Assessment: Reviewed RN Assessment  Last BM:  03/29/22  Height:   Ht Readings from Last 1 Encounters:  03/22/22 '5\' 5"'$  (1.651 m)    Weight:   Wt Readings from Last 1 Encounters:  03/29/22 53.4 kg    Ideal Body Weight:     BMI:  Body mass index is 19.59 kg/m.  Estimated Nutritional Needs:   Kcal:  6568-1275 kcals  Protein:  95-110 gm  Fluid:  >/= 1.8 L  Thalia Bloodgood, RD, LDN, CNSC.

## 2022-03-29 NOTE — Progress Notes (Signed)
Physical Therapy Session Note  Patient Details  Name: William Barron MRN: [id number] Date of Birth: May 01, 1945  Today's Date: 03/29/2022 PT Individual Time: 0916-0958 PT Individual Time Calculation (min): 42 min   Short Term Goals: Week 1:  PT Short Term Goal 1 (Week 1): Patient will perform basic transfers with CGA >50% of the time. PT Short Term Goal 2 (Week 1): Patient will ambulate >25 ft consistently using LRAD. PT Short Term Goal 3 (Week 1): Patient will initiate stair training.  Skilled Therapeutic Interventions/Progress Updates:    Chart reviewed and pt agreeable to therapy. Pt received seated in [facility] with RN present for medication and no c/o pain. Session focused on functional transfers to progress mobility goals. Pt initiated session with MaxA for upper/lower body dressing. Pt then completed 2 sit to stands with STEDY + supervision. Pt then c/o dizziness and nausea. BP assessed and found to be 102/72 mmHG (84). Pt requested ice chips and recovered with small rest. Pt then completed 5x sit to stand with MinA + RW progressing to CGA + RW with VC for sequencing. Pt again c/o nausea and dizziness that reduced with ice chips and rest. Pt completed one more sit to stand with intention for amb, however pt again c/o dizziness and nausea. PT relayed information to RN. At end of session, pt was left seated WC with alarm engaged, nurse call bell and all needs in reach.     Therapy Documentation Precautions:  Precautions Precautions: Fall, Other (comment) Precaution Comments: vision impaired, feeding tube Required Braces or Orthoses: Other Brace Other Brace: RLE AFO and shoes, pt and son think he does worse with AFO on (per chart) Restrictions Weight Bearing Restrictions: No General:      Therapy/Group: Individual Therapy  Marquette Old, PT, DPT 03/29/2022, 12:26 PM

## 2022-03-29 NOTE — Progress Notes (Signed)
Speech Language Pathology Daily Session Note  Patient Details  Name: DAOUD LOBUE MRN: [id number] Date of Birth: 05/12/1945  Today's Date: 03/29/2022 SLP Individual Time: 0815-0900 SLP Individual Time Calculation (min): 45 min  Short Term Goals: Week 2: SLP Short Term Goal 1 (Week 2): Patient will consume current diet with minimal overt s/s of aspiration with Min verbal cues for use of swallowing compensatory strategies. SLP Short Term Goal 1 - Progress (Week 2): Met SLP Short Term Goal 2 (Week 2): Patient will demonstrate efficient mastication and complete oral clearance with trials of Dys. 3 textures without reports of a globus sensation over 2 sessions prior to upgrade. SLP Short Term Goal 2 - Progress (Week 2): Progressing toward goal SLP Short Term Goal 3 (Week 2): Patient will demonstrate functional problem solving for basic and familiar tasks with Min verbal cues. SLP Short Term Goal 4 (Week 2): Patient will recall new, daily information with Mod verbal and visual cues for use of memory compensatory strategies. SLP Short Term Goal 4 - Progress (Week 2): Progressing toward goal SLP Short Term Goal 5 (Week 2): Patient will self-monitor and correct errors duirng functional tasks with Min verbal cues. SLP Short Term Goal 6 (Week 2): Patient will demonstrate sustained attention to functional tasks for 15 minutes with Min verbal cues for redirection. SLP Short Term Goal 6 - Progress (Week 2): Progressing toward goal  Skilled Therapeutic Interventions:   Pt seen for skilled SLP intervention to address cognitive and swallowing goals. Pt seen with Dys2 breakfast tray and thin liquids. He is able to feed himself with minimal assistance and some set up of food on fork. No immediate overt or subtle s/s of aspiration observed across intake. Pt exhibited effective mastication and oral clearance of Dys2 texture. Pt did exhibit a delayed coughing ~5 mins post completion of meals. He expectorated a  min-moderate amount of thick phlegm. No expectoration or backflow of solids/liquids were observed. Address functional problem solving and recall throughout assessment. Physician briefly present during session. Pt requesting when DhT can be removed. Physician recommended pt consistently consuming at least 50% of each meal before DhT is removed. Throughout session, prompted pt to recall this goal. He required max cues to recall information. Moderate cues also required for problem solving and visual scanning to locate desired item on meal tray. Cues required for pt to use spoon with yogurt vs fork towards end of meal.   Recommend continue current diet as tolerated. Continue SLP intervention to address cognitive and swallowing goals.   Pain  None reported   Therapy/Group: Individual Therapy  Wyn Forster 03/29/2022, 11:42 AM

## 2022-03-29 NOTE — Progress Notes (Addendum)
Patient ID: William Barron, male   DOB: 1946-01-15, 77 y.o.   MRN: [id number]   Advanced Heart Failure Rounding Note  PCP-Cardiologist: Minus Breeding, MD   Subjective:    Getting TFs through [facility], PO intake minimal  Remains weak/deconditioned despite last week in CIR.   Wife at bedside. Now with BRB in stool.   Objective:   Weight Range: 53.4 kg Body mass index is 19.59 kg/m.   Vital Signs:   Temp:  [98.1 F (36.7 C)-98.9 F (37.2 C)] 98.1 F (36.7 C) (01/15 0335) Pulse Rate:  [72-75] 72 (01/15 0335) Resp:  [18] 18 (01/15 0335) BP: (90-112)/(48-62) 112/62 (01/15 0335) SpO2:  [98 %-100 %] 100 % (01/15 0335) Weight:  [53.4 kg] 53.4 kg (01/15 0443) Last BM Date : 03/27/22  Weight change: Filed Weights   03/27/22 0407 03/28/22 0619 03/29/22 0443  Weight: 54.3 kg 53.5 kg 53.4 kg    Intake/Output:   Intake/Output Summary (Last 24 hours) at 03/29/2022 1102 Last data filed at 03/29/2022 0858 Gross per 24 hour  Intake 478 ml  Output 150 ml  Net 328 ml      Physical Exam    General:  weak/deconditioned appearing.  No respiratory difficulty HEENT: +cortrack Neck: supple. JVD not elevated. Carotids 2+ bilat; no bruits. No lymphadenopathy or thyromegaly appreciated. Cor: PMI nondisplaced. Regular rate & rhythm. No rubs, gallops or murmurs. Lungs: course  Abdomen: soft, nontender, nondistended. No hepatosplenomegaly. No bruits or masses. Good bowel sounds. Extremities: no cyanosis, clubbing, rash, edema  Neuro: alert & oriented x 3, cranial nerves grossly intact. moves all 4 extremities w/o difficulty. Affect pleasant.   Telemetry   Not on tele  Labs    CBC Recent Labs    03/29/22 0543 03/29/22 1035  WBC 6.2 7.8  HGB 8.4* 9.3*  HCT 28.1* 30.6*  MCV 78.1* 78.1*  PLT 393 426*    Basic Metabolic Panel Recent Labs    03/29/22 0543  NA 137  K 4.8  CL 107  CO2 22  GLUCOSE 152*  BUN 44*  CREATININE 1.65*  CALCIUM 8.3*   Liver Function  Tests No results for input(s): "AST", "ALT", "ALKPHOS", "BILITOT", "PROT", "ALBUMIN" in the last 72 hours.  No results for input(s): "LIPASE", "AMYLASE" in the last 72 hours. Cardiac Enzymes No results for input(s): "CKTOTAL", "CKMB", "CKMBINDEX", "TROPONINI" in the last 72 hours.  BNP: BNP (last 3 results) Recent Labs    03/07/22 1237  BNP 1,326.2*    ProBNP (last 3 results) No results for input(s): "PROBNP" in the last 8760 hours.   D-Dimer No results for input(s): "DDIMER" in the last 72 hours. Hemoglobin A1C No results for input(s): "HGBA1C" in the last 72 hours. Fasting Lipid Panel No results for input(s): "CHOL", "HDL", "LDLCALC", "TRIG", "CHOLHDL", "LDLDIRECT" in the last 72 hours. Thyroid Function Tests No results for input(s): "TSH", "T4TOTAL", "T3FREE", "THYROIDAB" in the last 72 hours.  Invalid input(s): "FREET3"  Other results:   Imaging    No results found.   Medications:     Scheduled Medications:  acidophilus  1 capsule Oral Daily   amiodarone  400 mg Per Tube Daily   aspirin  81 mg Oral Daily   clopidogrel  75 mg Per Tube Daily   dapagliflozin propanediol  10 mg Oral Daily   diclofenac Sodium  2 g Topical QID   famotidine  20 mg Per Tube Daily   feeding supplement (PROSource TF20)  60 mL Per Tube Daily   free  water  200 mL Per Tube Q12H   heparin  5,000 Units Subcutaneous Q8H   insulin aspart  0-9 Units Subcutaneous Q4H   insulin aspart  5 Units Subcutaneous TID WC   insulin glargine-yfgn  15 Units Subcutaneous Daily   lidocaine  1 patch Transdermal Q24H   lipase/protease/amylase  72,000 Units Oral Q6H   megestrol  400 mg Per Tube BID   mirabegron ER  25 mg Oral Daily   rosuvastatin  10 mg Per Tube Daily   testosterone  8 g Transdermal QHS    Infusions:  feeding supplement (JEVITY 1.5 CAL/FIBER)      PRN Medications: acetaminophen, alum & mag hydroxide-simeth, diphenhydrAMINE, guaiFENesin-dextromethorphan, magnesium hydroxide,  methocarbamol, prochlorperazine **OR** prochlorperazine **OR** prochlorperazine, sodium phosphate, sorbitol, traZODone, white petrolatum    Assessment/Plan   1. Acute on chronic systolic CHF: Echo (1/60) with EF 55-60%.  Echo 12/23 with EF 30-35%, normal RV, normal IVC.  LHC/RHC 12/23 with moderate CAD (60% mLAD, 70% dLAD, 70% D2, 60% dRCA) and low CI 1.87 with normal right and left heart filling pressures.  CAD does not explain fall in EF.  Cardiac MRI showed LV EF 33% with diffuse hypokinesis, RV EF 46% predominantly mid-wall LGE with some subendocardial component in the basal inferolateral wall and focally in the mid lateral wall.  Pattern is not clearly due to prior MI but cannot rule this out given moderate CAD on cath. Alternatively could be due to prior myocarditis. T2 not elevated in the basal inferolateral wall, so do not suspect active inflammation. Findings NOT consistent with cardiac AL amyloidosis. On exam and by recent [place], he is not volume overloaded.   William Barron held with hypotension - Does not need a diuretic currently, monitor wt/I/os closely w/ TFs  - Continue dapagliflozin  - William Barron held while in CIR - Would hold off on inotropic support for the time being, think main problem at this time is PNA/lung infiltrates and failure to thrive/malnutrition.  2. CVA: Right occipital CVA with distal right PCA severe stenosis vs occlusion. Presented with visual loss, now improved.  Neurology has signed off.  - ASA 325 + Plavix 75 x 3 months then ASA 81 daily.  - Crestor 10 daily 3. VT arrest: 1 minute CPR with ROSC.  He is on amiodarone.  Suspect triggered by lateral wall scar (prior MI versus myocarditis).  EP has seen, recommended amiodarone. Not likely a good ICD candidate given overall prognosis, no lifevest (DNR now). - Continue amiodarone po. Now on 400 mg daily 4. CAD: LHC this admission with moderate CAD (60% mLAD, 70% dLAD, 70% D2, 60% dRCA).  Out of proportion to cardiomyopathy.  As  noted above, cMRI suggested most likely prior myocarditis but cannot fully rule out prior MI involving basal lateral wall. No chest pain.  - Continue ASA as above.  - Crestor 10 mg daily.  5. Multiple myeloma with AL amyloidosis isolated to lymph nodes: Followed at [facility] since 2014. Treated with chemo in past. Has known bulky mediastinal/hilar lymphadenopathy. Has been off chemo/steroids for a year d/t frailty 6. Prostate cancer: Treated with radiation in 2022.  7. Right eye diplopia: Chronic, 6th nerve palsy.  8. H/o Whipple procedure for IPMN in 5/23.  9. Influenza A: Has been treated with 5 days oseltamivir this admission.  9. Pulmonary:  CT chest this admission showed extensive bilateral multilobar PNA, R>L and bulky mediastinal/hilar LAN, seen on prior studies. He has a cough, no fever or leukocytosis and PCT not  significantly elevated.  He had a VT arrest with 1 minute CPR, strong suspicion for aspiration pneumonitis.  Pulmonary has seen, no utility to bronchoscopy.  Breathing improved. Stable on RA  - Continue Augmentin for aspiration coverage.  - Had barium swallow. Concern for possible proximal esophageal stricture. Dysphagia 2 diet - SLP following - Incentive spirometry.  10. Frailty/failure to thrive: Did not recover well at all post-Whipple in 5/23 per family.  Has had poor appetite.  Having trouble swallowing.  Albumin low, malnourished.  - Mobilize, PT/OT. In CIR  - TFs through [facility] + PO intake as tolerated 11. Thrush: With odynophagia.  - Continue Nystatin.  12. GIB - BRB in stool per report, Hgb stable at 9.3 13. GOC - He is fairly stable from cardiac standpoint, suspect main issue is aspiration pneumonitis and frailty/malnutrition.  - Concern for poor long-term prognosis with failure to thrive, VT arrest, PNA. Palliative care following. CODE status now DNR. Now in CIR.  - MD had extensive conversation with patient and wife today. Getting palliative care involved to better  assist with GOC. Would recommend hospice.   Length of Stay: [place], NP  03/29/2022, 11:02 AM  Advanced Heart Failure Team Pager [phone] (M-F; 7a - 5p)  Please contact [facility] for night-coverage after hours (5p -7a ) and weekends on [email or web address]  Patient seen and examined with the above-signed Advanced Practice Provider and/or Housestaff. I personally reviewed laboratory data, imaging studies and relevant notes. I independently examined the patient and formulated the important aspects of the plan. I have edited the note to reflect any of my changes or salient points. I have personally discussed the plan with the patient and/or family.  Remains very weak. Falls asleep mid exam. Appetite poor. Getting TFs through [facility]. Having bright red blood per rectum. Family concerned that he is not progressing and is requesting Hospice Care.   General:  Weak appearing. No resp difficulty HEENT: normal left eye deviation + [facility] Neck: supple. no JVD. Carotids 2+ bilat; no bruits. No lymphadenopathy or thryomegaly appreciated. Cor: PMI nondisplaced. Regular rate & rhythm. No rubs, gallops or murmurs. Lungs: clear Abdomen: R flank ecchymosis soft, nontender, nondistended. No hepatosplenomegaly. No bruits or masses. Good bowel sounds. Extremities: no cyanosis, clubbing, rash, edema Neuro: alert & oriented, falls asleep mid exam  moves all 4 extremities w/o difficulty. Affect pleasant  He continues to deteriorate with multi-system organ failure and poor appetite. He feels very weak but denies other specific complaints. Dr. Tessa Lerner contacted [place] this am with concern over his progressive deterioration and mounting clinic issues.   When we came to visit William Barron his wife was in the room and very upset. She is concerned about his deterioration and is interested in exploring Hospice options. She also expressed concern that she feels she is being pressed to take him home and is worried she  would be unable to care for him at home. She has contacted several members of the administrative staff about this.   From a clinic perspective, I agree that he continues to deteriorate and Hospice is a very realistic consideration for him. I asked him directly about this and he says that he feels very weak and would consider. We discussed options of home Hospice, residential Hospice and inpatient Hospice. I think residential Hospice would be most appropriate. I have personally reached out to our Palliative team to discuss and they will see him in consultation as soon as possible.   Would hold  Plavix in setting of potential GI bleed and flank ecchymosis.   Glori Bickers, MD  11:58 AM

## 2022-03-30 ENCOUNTER — Ambulatory Visit: Payer: Medicare Other | Admitting: Endocrinology

## 2022-03-30 DIAGNOSIS — Z515 Encounter for palliative care: Secondary | ICD-10-CM

## 2022-03-30 DIAGNOSIS — C9000 Multiple myeloma not having achieved remission: Secondary | ICD-10-CM

## 2022-03-30 NOTE — Progress Notes (Signed)
                                                                                                                                                                                                          Palliative Medicine Progress Note   Patient Name: William Barron       Date: 03/30/2022 DOB: [birth year]  Age: 77 y.o. MRN#: [id number] Attending Physician: Meredith Staggers, MD Primary Care Physician: [name], MD Admit Date: 03/22/2022    HPI/Patient Profile: William Barron is a 77 year old male with multiple serious medical problems including multiple myeloma and a whipple procedure on 07/2021.  Since then he has had multiple hospitalizations and life threatening complications. He is irreversibly and chronically critically ill. He was recently hospitalized 03/05/22 with acute CVA, influenza A, and new onset heart failure with reduced EF.  He was seen by PMT during this hospitalization; code status was changed to DNR/DNI but goal of care was to continue with full scope treatment    Subjective: Chart reviewed. Discussed with Dr. Hilma Favors. During their discussion yesterday, it became apparent that Mr. Skowronek has reached a point where he realizes that the recovery he hoped for is not possible and continuing with aggressive medical interventions was no longer desired.   Bedside visit with patient, wife, and Misty from [facility] hospice. William Barron is resting comfortably. He does not awaken during the visit.   Goals are clearly for comfort care. His wife speaks to the importance of peace and dignity for William Barron at end of life. Emotional support provided.  Reviewed plan for placement at [facility] with Hospice services. Discussed that [facility] approval is pending.    Palliative Medicine Assessment & Plan   Assessment: Principal Problem:   Acute CVA (cerebrovascular accident) [facility])    Recommendations/Plan: Comfort care only Plan for placement at [facility] with hospice services PO  intake as tolerated -  Pain medication is PRN and in sublingual form  Offload pressure to sacrum PMT will continue to support   Code Status: DNR/DNI   Thank you for allowing the Palliative Medicine Team to assist in the care of this patient.    Lavena Bullion, NP   Please contact Palliative Medicine Team phone at [phone] for questions and concerns.  For individual providers, please see AMION.

## 2022-03-30 NOTE — Progress Notes (Signed)
William Barron [address] [facility]) Hospice hospital liaison note  [facility] liaison continues to follow and communicate with family regarding plans for D/C to [facility].   Thank you! [name], BSN, RN [phone]

## 2022-03-30 NOTE — Progress Notes (Signed)
PROGRESS NOTE    Subjective/Complaints:  Pt tells me had the best breakfast this morning! Asked if he could have some fresh fruit. Happy that NG is out.  ROS: Patient denies fever, rash, sore throat, blurred vision, dizziness, nausea, vomiting, diarrhea, cough, shortness of breath or chest pain,   headache, or mood change.    Objective:   No results found. Recent Labs    03/29/22 0543 03/29/22 1035  WBC 6.2 7.8  HGB 8.4* 9.3*  HCT 28.1* 30.6*  PLT 393 426*   Recent Labs    03/29/22 0543  NA 137  K 4.8  CL 107  CO2 22  GLUCOSE 152*  BUN 44*  CREATININE 1.65*  CALCIUM 8.3*     Intake/Output Summary (Last 24 hours) at 03/30/2022 0904 Last data filed at 03/30/2022 0755 Gross per 24 hour  Intake 920 ml  Output 275 ml  Net 645 ml        Physical Exam: Vital Signs Blood pressure 111/63, pulse 68, temperature 97.6 F (36.4 C), temperature source Oral, resp. rate 18, height '5\' 5"'$  (1.651 m), weight 52.8 kg, SpO2 97 %.   Constitutional: No distress . Vital signs reviewed. HEENT: NCAT, EOMI, oral membranes moist Neck: supple Cardiovascular: RRR without murmur. No JVD    Respiratory/Chest: CTA Bilaterally without wheezes or rales. Normal effort    GI/Abdomen: BS +, non-tender, non-distended Ext: no clubbing, cyanosis, or edema Psych: pleasant and cooperative  Skin: scattered bruises Neuro:  alert, oriented to person, place.  Fair awareness. Follows commands. STM deficits. Speech dysarthric but improving. Right CN VI palsy. RUE 4+, LUE 4/5. RLE 4/5 prox with foot drop distally. LLE 4/5.  --no change Musculoskeletal: no pain with AROm/PROM    Assessment/Plan: 1. Functional deficits which require 3+ hours per day of interdisciplinary therapy in a comprehensive inpatient rehab setting. Physiatrist is providing close team supervision and 24 hour management of active medical problems listed below. Physiatrist and  rehab team continue to assess barriers to discharge/monitor patient progress toward functional and medical goals  Care Tool:  Bathing    Body parts bathed by patient: Right arm, Left arm, Chest, Abdomen, Front perineal area, Right upper leg, Left upper leg, Face   Body parts bathed by helper: Buttocks, Right lower leg, Left lower leg     Bathing assist Assist Level: Moderate Assistance - Patient 50 - 74%     Upper Body Dressing/Undressing Upper body dressing   What is the patient wearing?: Pull over shirt    Upper body assist Assist Level: Minimal Assistance - Patient > 75%    Lower Body Dressing/Undressing Lower body dressing      What is the patient wearing?: Pants     Lower body assist Assist for lower body dressing: Maximal Assistance - Patient 25 - 49%     Toileting Toileting    Toileting assist Assist for toileting: Maximal Assistance - Patient 25 - 49%     Transfers Chair/bed transfer  Transfers assist     Chair/bed transfer assist level: Minimal Assistance - Patient > 75% Chair/bed transfer assistive device: Programmer, multimedia   Ambulation assist  Assist level: Moderate Assistance - Patient 50 - 74% Assistive device: Walker-rolling Max distance: 12 ft   Walk 10 feet activity   Assist     Assist level: Moderate Assistance - Patient - 50 - 74% Assistive device: Walker-rolling   Walk 50 feet activity   Assist Walk 50 feet with 2 turns activity did not occur: Safety/medical concerns         Walk 150 feet activity   Assist Walk 150 feet activity did not occur: Safety/medical concerns         Walk 10 feet on uneven surface  activity   Assist Walk 10 feet on uneven surfaces activity did not occur: Safety/medical concerns         Wheelchair     Assist Is the patient using a wheelchair?: Yes Type of Wheelchair: Manual    Wheelchair assist level: Supervision/Verbal cueing Max wheelchair distance: 150  ft    Wheelchair 50 feet with 2 turns activity    Assist        Assist Level: Supervision/Verbal cueing   Wheelchair 150 feet activity     Assist      Assist Level: Supervision/Verbal cueing   Blood pressure 111/63, pulse 68, temperature 97.6 F (36.4 C), temperature source Oral, resp. rate 18, height '5\' 5"'$  (1.651 m), weight 52.8 kg, SpO2 97 %.  Medical Problem List and Plan: 1. Functional deficits secondary to right occipital infarct d/t right PCA stenosis/occlusion, multiple medical conditions             -patient is now hospice. Awaiting placement. Appreciate help of cardiology, palliative care/hospice teams. Hope to hear something today from [place]  -hold on further therapy 2.  Antithrombotics: -DVT/anticoagulation:  Pharmaceutical: Heparin             -antiplatelet therapy: Aspirin 325 mg daily plus Plavix 75 mg daily for 3 months then aspirin alone   3. Pain Management: Tylenol, Lidoderm as needed             -Voltaren gel   4. Mood/Behavior/Sleep: LCSW to evaluate and provide emotional support             -antipsychotic agents: n/a   5. Neuropsych/cognition: This patient is capable of making decisions on his own behalf.   6. Skin/Wound Care: Routine skin care checks   7. Fluids/Electrolytes/Nutrition:                  -Dysphagia 2 diet, thin liquids; liberalize given hospice plan?                 1/16 NGT dc'ed. Pt eating what he can. 8: VT arrest: Likely due to prior MI versus myocarditis status post EP eval             -follow-up with cardiology (meds as per #10)             - no plan now for life vest/ICD 9: CAD status post left heart catheterization             -Continue aspirin and statin   10: Acute on chronic systolic heart failure:               - Entresto 24-26 mg BID stopped.             -Continue dapagliflozin 10 mg daily stopped             -  spironolactone 12.5 mg daily stopped             -  continue amiodarone 400 mg BID             -   carvedilol 3.125 mg BID stopped    11: Cardiomyopathy: Likely due to myocarditis versus prior MI   12: Bilateral pneumonia; likely aspiration pneumonitis             -completed antibiotics             -CT chest 12/30: R>L bronchopneumonia, chronic sarcoidosis   13: Intraductal papillary mucinous neoplasms of the pancreas status post Whipple procedure 2023             -Continue pancreatic enzymes             -Follow-up with [name]   14: Multiple myeloma with AL amyloidosis: stable;    15: Influenza A: Status post oseltamivir x 5 days this admission   16: DM:     1/12 Semglee reduced back to 15u daily    1/15 cbg's controlled-->DC'ED CBG CHECKS  25: Code status: DNR as above  26. Gross blood in stool:  -no further monitoring    LOS: 8 days A FACE TO FACE EVALUATION WAS PERFORMED  Meredith Staggers 03/30/2022, 9:04 AM

## 2022-03-30 NOTE — Progress Notes (Signed)
Speech Language Pathology Discharge Summary  Patient Details  Name: LYNTON CRESCENZO MRN: [id number] Date of Birth: [birth year]  Date of Discharge from SLP service:March 30, 2022  Reasons goals not met: Patient is transitioning to hospice care  Clinical Impression/Discharge Summary: Patient has not met any LTGs as he is currently unable to complete the inpatient rehabilitation program. Patient has had a decline in medical status and is transitioning to hospice care at a skilled nursing facility.   Recommendation: Hospice care at a skilled nursing facility  Equipment:  N/A  Reasons for discharge: Change in medical status with transition to hospice care        Adrian, [place] 03/30/2022, 10:12 AM

## 2022-03-30 NOTE — Plan of Care (Addendum)
All goals have been discharged as patient is now transitioning to hospice care   Problem: RH Swallowing Goal: LTG Patient will consume least restrictive diet using compensatory strategies with assistance (SLP) Description: LTG:  Patient will consume least restrictive diet using compensatory strategies with assistance (SLP) Outcome: N/A   Problem: RH Problem Solving Goal: LTG Patient will demonstrate problem solving for (SLP) Description: LTG:  Patient will demonstrate problem solving for basic/complex daily situations with cues  (SLP) Outcome: N/A   Problem: RH Memory Goal: LTG Patient will demonstrate ability for day to day (SLP) Description: LTG:   Patient will demonstrate ability for day to day recall/carryover during cognitive/linguistic activities with assist  (SLP) Outcome: N/A Goal: LTG Patient will use memory compensatory aids to (SLP) Description: LTG:  Patient will use memory compensatory aids to recall biographical/new, daily complex information with cues (SLP) Outcome: N/A   Problem: RH Attention Goal: LTG Patient will demonstrate this level of attention during functional activites (SLP) Description: LTG:  Patient will will demonstrate this level of attention during functional activites (SLP) Outcome: N/A   Problem: RH Awareness Goal: LTG: Patient will demonstrate awareness during functional activites type of (SLP) Description: LTG: Patient will demonstrate awareness during functional activites type of (SLP) Outcome: N/A

## 2022-03-30 NOTE — Patient Care Conference (Signed)
Inpatient RehabilitationTeam Conference and Plan of Care Update Date: 03/30/2022  Time: 10:06 AM    Patient Name: William Barron      Medical Record Number: [id number]  Date of Birth: [birth year] Sex: Male         Room/Bed: 4M07C/4M07C-01 Payor Info: Payor: [employer] / Plan: [place] / Product Type: *No Product type* /    Admit Date/Time:  03/22/2022  4:39 PM  Primary Diagnosis:  Acute CVA (cerebrovascular accident) [facility])  Hospital Problems: Principal Problem:   Acute CVA (cerebrovascular accident) [facility])    Expected Discharge Date: Expected Discharge Date:  (hospice)  Team Members Present: Physician leading conference: Dr. Alger Simons Social Worker Present: Loralee Pacas, [place] Nurse Present: Tacy Learn, RN PT Present: Tereasa Coop, PT OT Present: Mariane Masters, OT SLP Present: Weston Anna, SLP PPS Coordinator present : Gunnar Fusi, SLP     Current Status/Progress Goal Weekly Team Focus  Bowel/Bladder   Pt continent B/B with some incontinence episode of bladder. LBM 03/29/22   Pt will regain continenece   Toilet prn/qshift    Swallow/Nutrition/ Hydration   Dys. 2 textures with thin liquids, Min A   N/A  Therapy has been discharged as patient is now transitioning to hospice care    ADL's   min-mod ADL, Min-Mod functional transfer, poor activity tolerance, decreased balance   supervision   Pt transitioning to hospice. family/pt education, ADL re-training, sitting balance, activity tolerance, functional transfers.    Mobility   DC to hospice care   DC to hospice care  DC to hospice care    Communication                Safety/Cognition/ Behavioral Observations  Mod-Max A   N/A   Therapy has been discharged as patient is now transitioning to hospice care    Pain   No pain verbalize by pt   Pt will be free from pain   Assess pt for pain qshift/prn    Skin   Skin is intact.with ecchymosis aarea to  upper chest and abdominal areas, erethema/redness to buttock sacral area with dressing in place   Pt will maintain skin intergrity with no breakdown  Assess skin qshift/prn for breakdown      Discharge Planning:  Pt wife reporting she is unable to care for him based on his current condition. SNF referral faxed to [facility]  for approval and will have answer on if approved/denied on Tuesday (1/16) afternoon. She also requested hospice care over the weekend. SNF pending VA approval. Pt has been approved for hospice care, and now transitioned to comfort care.   Team Discussion: Acute CVA. Continent B/B. Chronic pain controlled with PRN medication if needed. Skin CDI. Patient has transitioned to Hospice care. Therapies discontinued. Cortrak removed.  Transitioned to comfort care. Patient on target to meet rehab goals: [place] and progress notes for long and short-term goals.   Revisions to Treatment Plan:  Hospice care  Teaching Needs: Comfort measures and wishes according to plan in place.   Current Barriers to Discharge: Transitioning to Hospice with Va approval  Possible Resolutions to Barriers: Family education, nursing education.     Medical Summary Current Status: pt now hospice due to worsening medical/nutritional issues    Barriers to Discharge Comments: end of life Possible Resolutions to [employer] Focus: working on placement/hospice   Continued Need for Acute Rehabilitation Level of Care: The patient requires daily medical management by a physician  with specialized training in physical medicine and rehabilitation for the following reasons: Direction of a multidisciplinary physical rehabilitation program to maximize functional independence : Yes Medical management of patient stability for increased activity during participation in an intensive rehabilitation regime.: Yes Analysis of laboratory values and/or radiology reports with any subsequent need for  medication adjustment and/or medical intervention. : Yes   I attest that I was present, lead the team conference, and concur with the assessment and plan of the team.   Ernest Pine 03/31/2022, 7:37 AM

## 2022-03-30 NOTE — Progress Notes (Signed)
Physical Therapy Note  Patient Details  Name: William Barron MRN: [id number] Date of Birth: [birth year] Today's Date: 03/30/2022  Physical Therapy Discharge Note  This patient was unable to complete the inpatient rehab program due to decline in medical status and transition to hospice services; therefore did not meet their long term goals. Pt left the program at a max assist level for their functional mobility/ transfers. This patient is being discharged from PT services at this time.  Pt's perception of pain in the last five days was unable to answer at this time.   See CareTool for functional status details  If the patient is able to return to inpatient rehabilitation within 3 midnights, this may be considered an interrupted stay and therapy services will resume as ordered. Modification and reinstatement of their goals will be made upon completion of therapy service reevaluations.    Breck Coons, PT, DPT 03/30/2022, 1:43 PM

## 2022-03-30 NOTE — Progress Notes (Addendum)
Patient ID: William Barron, male   DOB: 1945/04/11, 77 y.o.   MRN: [id number]  915am-SW left message for  VA-Alicia Driver with [facility] ([phone] ext. [phone]) to discuss referral with new changes, and confirm all new clinicals were received and can be added for review for today. SW waiting on follow-up.   *SW received updates from [place] indicating the only authorization listed at this time is for [facility] and homemaker aide services. VA community care authorizations are limited to the identified specialty so he does not have authorization for SNF or hospice yet.   1436-SW called and left a message for VA-Alicia Driver with [place] ([phone] ext. [phone]) about referral status. SW waiting on follow-up.   [place]- SW spoke with Peggy/LTC liaison with [name] [phone]) to discuss if available to use his Medicare instead. Discussed how he was admitted under his [place] so he would have to wait for VA approval. She also reports that the pt was scheduled to discharge today will not move transition until Thursday when his furniture is delivered to his apartment. SW shared will follow-up once there is more information.   Pageland Lundy/ [facility] following up about referral. Stating pt was approved for an indefinite contract. SW inquired if all documents and messages were received with regard to changes with medical condition and pt now being discharged with hospice. States Elmo Putt not int today and someone is covering. SW shared preferred SNF is [name] and hospice agency being [facility]. States they will manage any future contracts with facility moving forward. SW faxed over updated clinicals to [place] as requested.   28- SW spoke with Penny/LTC liaison with [name] to discuss approval. Hospice agency-[facility]. Tentative move in date for Friday (1/19). Pt goin to [facility]#[id number]; Nurse Report [phone]. Requests for move in  after lunch. SW shared will schedule transportation with [employer]. SW will confirm patient moves out as planned on Thursday.   [place] spoke with Randy/[facility] to schedule transportation pick up for at 1pm on Friday (1/19).  26- SW called pt wife [name] to inform on above. SW reiterated this is a tentative admission, and SW will confirm if patient moves out as planned.   Loralee Pacas, MSW, [place] Office: [phone] Cell: [phone] Fax: [phone]

## 2022-03-30 NOTE — Progress Notes (Signed)
Occupational Therapy Discharge Summary  Patient Details  Name: William Barron MRN: [id number] Date of Birth: 1946-02-17  Date of Discharge from [place] service:March 29, 2022   Patient has met 5 of 11 long term goals due to improved activity tolerance, improved balance, and postural control.  Patient to discharge at overall supervision- Mod Assist level.  Patient's abruptly moved toward hospice care after discussing with wife and palliative. They have determined goals of care do not include therapy as pt fatigues very easily and wants to rest. Pt did not finish program therefore did not meet goals set at supervision level.  Reasons goals not met: pt abruptly DC to hospice services and awaiting bed at SNF  Recommendation:  Patient will benefit from ongoing skilled OT services in skilled nursing facility setting but DC from therapy services to focus on comfort at end of life  Equipment: No equipment provided  Reasons for discharge:  pursue hospice/comfort care  Patient/family agrees with progress made and goals achieved: Yes   SOHIL TIMKO 03/30/2022, 10:07 AM

## 2022-03-31 MED ORDER — TESTOSTERONE 50 MG/5GM (1%) TD GEL
8.0000 g | Freq: Every day | TRANSDERMAL | Status: DC
  Administered 2022-03-31 – 2022-04-01 (×2): 8 g via TRANSDERMAL
  Filled 2022-03-31 (×2): qty 10

## 2022-03-31 NOTE — Progress Notes (Signed)
Patient ID: William Barron, male   DOB: 04-22-1945, 77 y.o.   MRN: [id number]  SW updated [facility] SW Cassie on transition to [place] with hospice care.   Loralee Pacas, MSW, [place] Office: [phone] Cell: [phone] Fax: [phone]

## 2022-03-31 NOTE — Progress Notes (Signed)
Patient ID: William Barron, male   DOB: [birth year], 77 y.o.   MRN: [id number]   Advanced Heart Failure Rounding Note  PCP-Cardiologist: Minus Breeding, MD   Subjective:    Patient now transitioned to full comfort care. Cor-trak out.   Currently somnolent. Will arouse but not converse,   Objective:   Weight Range: 52.8 kg Body mass index is 19.37 kg/m.   Vital Signs:   Temp:  [97.6 F (36.4 C)-98.4 F (36.9 C)] 98.4 F (36.9 C) (01/16 1806) Pulse Rate:  [67-71] 67 (01/16 1806) Resp:  [16-18] 16 (01/16 1806) BP: (99-111)/(49-63) 103/50 (01/16 1806) SpO2:  [97 %-99 %] 98 % (01/16 1806) Weight:  [52.8 kg] 52.8 kg (01/16 0617) Last BM Date : 03/29/22  Weight change: Filed Weights   03/28/22 0619 03/29/22 0443 03/30/22 0617  Weight: 53.5 kg 53.4 kg 52.8 kg    Intake/Output:   Intake/Output Summary (Last 24 hours) at 03/31/2022 0004 Last data filed at 03/30/2022 1739 Gross per 24 hour  Intake 1140 ml  Output 125 ml  Net 1015 ml      Physical Exam   General:  Terminally ill appearing. Thin  No resp difficulty HEENT: normal Neck: supple. no JVD. Carotids 2+ bilat; no bruits. No lymphadenopathy or thryomegaly appreciated. Cor: PMI nondisplaced. Regular rate & rhythm. No rubs, gallops or murmurs. Lungs: clear Abdomen: soft, nontender, nondistended. No hepatosplenomegaly. No bruits or masses. Good bowel sounds. Extremities: no cyanosis, clubbing, rash, edema cachetic Neuro: somnolent will arouse but not communicate  Telemetry   Not on tele  Labs    CBC Recent Labs    03/29/22 0543 03/29/22 1035  WBC 6.2 7.8  HGB 8.4* 9.3*  HCT 28.1* 30.6*  MCV 78.1* 78.1*  PLT 393 426*    Basic Metabolic Panel Recent Labs    03/29/22 0543  NA 137  K 4.8  CL 107  CO2 22  GLUCOSE 152*  BUN 44*  CREATININE 1.65*  CALCIUM 8.3*   Liver Function Tests No results for input(s): "AST", "ALT", "ALKPHOS", "BILITOT", "PROT", "ALBUMIN" in the last 72 hours.  No  results for input(s): "LIPASE", "AMYLASE" in the last 72 hours. Cardiac Enzymes No results for input(s): "CKTOTAL", "CKMB", "CKMBINDEX", "TROPONINI" in the last 72 hours.  BNP: BNP (last 3 results) Recent Labs    03/07/22 1237  BNP 1,326.2*    ProBNP (last 3 results) No results for input(s): "PROBNP" in the last 8760 hours.   D-Dimer No results for input(s): "DDIMER" in the last 72 hours. Hemoglobin A1C No results for input(s): "HGBA1C" in the last 72 hours. Fasting Lipid Panel No results for input(s): "CHOL", "HDL", "LDLCALC", "TRIG", "CHOLHDL", "LDLDIRECT" in the last 72 hours. Thyroid Function Tests No results for input(s): "TSH", "T4TOTAL", "T3FREE", "THYROIDAB" in the last 72 hours.  Invalid input(s): "FREET3"  Other results:   Imaging    No results found.   Medications:     Scheduled Medications:  amiodarone  400 mg Oral Daily   diclofenac Sodium  2 g Topical QID   insulin glargine-yfgn  15 Units Subcutaneous Daily   lidocaine  1 patch Transdermal Q24H    Infusions:    PRN Medications: oxyCODONE **AND** acetaminophen, alum & mag hydroxide-simeth, diphenhydrAMINE, guaiFENesin-dextromethorphan, lipase/protease/amylase, oxyCODONE, PHENObarbital, prochlorperazine **OR** prochlorperazine **OR** prochlorperazine, sodium phosphate, sorbitol, white petrolatum    Assessment/Plan   1. Acute on chronic systolic CHF:  2. CVA: Right occipital CVA with distal right PCA severe stenosis vs occlusion.  3. VT arrest:  4. CAD:  5. Multiple myeloma with AL amyloidosis isolated to lymph nodes:  6. Prostate cancer: Treated with radiation in 2022.  7. Right eye diplopia: Chronic, 6th nerve palsy.  8. H/o Whipple procedure for IPMN in 5/23.  9. Influenza A: Has been treated with 5 days oseltamivir this admission.  10. Frailty/failure to thrive: Did not recover well at all post-Whipple in 5/23 11. DNR/DNI  He is end-stage. Has transitioned to comfort care,. He  appears comfortable on exam.   Palliative Care has seen and I have d/w Dr. Hilma Favors. Appreciate their assistance.   Plan is to transfer [facility] with Hospice.   AHF team will follow at a distance and help as needed.    Length of Stay: [place], MD   Advanced Heart Failure Team Pager [phone] (M-F; 7a - 5p)  Please contact [facility] for night-coverage after hours (5p -7a ) and weekends on [email or web address]

## 2022-03-31 NOTE — Progress Notes (Addendum)
PROGRESS NOTE    Subjective/Complaints:  No new issues today. Family requested to resume androgel at bedtime and that we resume therapies.   ROS: Patient denies fever, rash, sore throat, blurred vision, dizziness, nausea, vomiting, diarrhea, cough, shortness of breath or chest pain,   or mood change.     Objective:   No results found. Recent Labs    03/29/22 0543 03/29/22 1035  WBC 6.2 7.8  HGB 8.4* 9.3*  HCT 28.1* 30.6*  PLT 393 426*   Recent Labs    03/29/22 0543  NA 137  K 4.8  CL 107  CO2 22  GLUCOSE 152*  BUN 44*  CREATININE 1.65*  CALCIUM 8.3*     Intake/Output Summary (Last 24 hours) at 03/31/2022 0814 Last data filed at 03/31/2022 0801 Gross per 24 hour  Intake 830 ml  Output 125 ml  Net 705 ml        Physical Exam: Vital Signs Blood pressure (!) 100/55, pulse 67, temperature 98.6 F (37 C), temperature source Oral, resp. rate 16, height '5\' 5"'$  (1.651 m), weight 52.8 kg, SpO2 99 %.   Constitutional: No distress . Vital signs reviewed. HEENT: NCAT, EOMI, oral membranes moist Neck: supple Cardiovascular: RRR without murmur. No JVD    Respiratory/Chest: CTA Bilaterally without wheezes or rales. Normal effort    GI/Abdomen: BS +, non-tender, non-distended Ext: no clubbing, cyanosis, or edema Psych: pleasant and cooperative  Skin: scattered bruises Neuro:  alert, oriented to person, place.  Fair awareness. Follows commands. STM deficits. Speech dysarthric but improving. Right CN VI palsy. RUE 4+, LUE 4/5. RLE 4/5 prox with foot drop distally. LLE 4/5.  --no change Musculoskeletal: no pain with AROm/PROM    Assessment/Plan: 1. Functional deficits which require 3+ hours per day of interdisciplinary therapy in a comprehensive inpatient rehab setting. Physiatrist is providing close team supervision and 24 hour management of active medical problems listed below. Physiatrist and rehab team continue  to assess barriers to discharge/monitor patient progress toward functional and medical goals  Care Tool:  Bathing    Body parts bathed by patient: Right arm, Left arm, Chest, Abdomen, Front perineal area, Right upper leg, Left upper leg, Face   Body parts bathed by helper: Buttocks, Right lower leg, Left lower leg     Bathing assist Assist Level: Moderate Assistance - Patient 50 - 74%     Upper Body Dressing/Undressing Upper body dressing   What is the patient wearing?: Pull over shirt    Upper body assist Assist Level: Supervision/Verbal cueing    Lower Body Dressing/Undressing Lower body dressing      What is the patient wearing?: Pants     Lower body assist Assist for lower body dressing: Maximal Assistance - Patient 25 - 49%     Toileting Toileting    Toileting assist Assist for toileting: Minimal Assistance - Patient > 75%     Transfers Chair/bed transfer  Transfers assist     Chair/bed transfer assist level: Minimal Assistance - Patient > 75% Chair/bed transfer assistive device: Programmer, multimedia   Ambulation assist      Assist level: Moderate Assistance - Patient 50 - 74%  Assistive device: Walker-rolling Max distance: 15'   Walk 10 feet activity   Assist     Assist level: Moderate Assistance - Patient - 50 - 74% Assistive device: Walker-rolling   Walk 50 feet activity   Assist Walk 50 feet with 2 turns activity did not occur: Safety/medical concerns         Walk 150 feet activity   Assist Walk 150 feet activity did not occur: Safety/medical concerns         Walk 10 feet on uneven surface  activity   Assist Walk 10 feet on uneven surfaces activity did not occur: Safety/medical concerns         Wheelchair     Assist Is the patient using a wheelchair?: No Type of Wheelchair: Manual    Wheelchair assist level: Supervision/Verbal cueing Max wheelchair distance: 150 ft    Wheelchair 50 feet with 2  turns activity    Assist        Assist Level: Supervision/Verbal cueing   Wheelchair 150 feet activity     Assist      Assist Level: Supervision/Verbal cueing   Blood pressure (!) 100/55, pulse 67, temperature 98.6 F (37 C), temperature source Oral, resp. rate 16, height '5\' 5"'$  (1.651 m), weight 52.8 kg, SpO2 99 %.  Medical Problem List and Plan: 1. Functional deficits secondary to right occipital infarct d/t right PCA stenosis/occlusion, multiple medical conditions             -patient is now hospice. Placement pending for Friday  -will resume therapies per family request, 15/7 2.  Antithrombotics: -DVT/anticoagulation:  Pharmaceutical: Heparin             -antiplatelet therapy: Aspirin 325 mg daily plus Plavix 75 mg daily for 3 months then aspirin alone   3. Pain Management: Tylenol, Lidoderm as needed             -Voltaren gel   -reports pain is controlled 4. Mood/Behavior/Sleep: LCSW to evaluate and provide emotional support             -antipsychotic agents: n/a   5. Neuropsych/cognition: This patient is capable of making decisions on his own behalf.   6. Skin/Wound Care: Routine skin care checks   7. Fluids/Electrolytes/Nutrition:                  -Dysphagia 2 diet, thin liquids; liberalize given hospice plan?                 1/16 NGT dc'ed  -intake poor to variable  8: VT arrest: Likely due to prior MI versus myocarditis status post EP eval             -follow-up with cardiology (meds as per #10)             - no plan now for life vest/ICD 9: CAD status post left heart catheterization             -Continue aspirin and statin   10: Acute on chronic systolic heart failure:               - Entresto 24-26 mg BID stopped.             -Continue dapagliflozin 10 mg daily stopped             -  spironolactone 12.5 mg daily stopped             -continue amiodarone 400 mg BID             -  carvedilol 3.125 mg BID stopped    11: Cardiomyopathy: Likely due  to myocarditis versus prior MI   12: Bilateral pneumonia; likely aspiration pneumonitis             -completed antibiotics             -CT chest 12/30: R>L bronchopneumonia, chronic sarcoidosis   13: Intraductal papillary mucinous neoplasms of the pancreas status post Whipple procedure 2023             -Continue pancreatic enzymes             -Follow-up with [name]   14: Multiple myeloma with AL amyloidosis: stable;    15: Influenza A: Status post oseltamivir x 5 days this admission   16: DM:     1/12 Semglee reduced back to 15u daily    1/15 cbg's controlled-->DC'ED CBG CHECKS  17. Low tesosterone: resumed androgel per familly request  25: Code status: DNR as above  26. Gross blood in stool:  -no further monitoring    LOS: 9 days A FACE TO FACE EVALUATION WAS PERFORMED  Meredith Staggers 03/31/2022, 8:14 AM

## 2022-03-31 NOTE — Progress Notes (Signed)
William Barron 9H74 [facility]) Hospice hospital liaison note   [address] [facility] liaison continues to follow and communicate with family regarding plans for D/C to [facility].   Current plans for d/c Friday pending bed availability.   Thank you! [name], BSN, RN [phone]

## 2022-03-31 NOTE — Progress Notes (Signed)
Pt family requested to continue with the Androgel transdermal at bedtime/Therapy. Wife stated it boost patient energy level.

## 2022-03-31 NOTE — Evaluation (Signed)
Recreational Therapy Assessment and Plan  Patient Details  Name: William Barron MRN: [id number] Date of Birth: 1945-06-17 Today's Date: 03/31/2022  Rehab Potential:  Fair ELOS:   d/c to SNF with hospice care  Assessment   Hospital Problem: Principal Problem:   Acute CVA (cerebrovascular accident) [facility])     Past Medical History:      Past Medical History:  Diagnosis Date   Amyloidosis ([place])     Diabetes mellitus without complication ([place])     Feeding tube dysfunction, initial encounter 08/12/2021   GERD (gastroesophageal reflux disease)     Hearing loss      Has hearing aids   History of kidney stones     Hyperlipidemia     Jejunostomy tube present ([place])     Multiple myeloma ([place])      and amylodosis    Nephrolithiasis     Occasional tremors     Pancreatic insufficiency     Pneumonia     Prostate cancer ([place])     Prostatitis      acute and chronic   Skin cancer      Past Surgical History:       Past Surgical History:  Procedure Laterality Date   APPENDECTOMY       CARDIAC CATHETERIZATION N/A 06/13/2015    Procedure: Left Heart Cath and Coronary Angiography;  Surgeon: Jettie Booze, MD;  Location: [facility];  Service: Cardiovascular;  Laterality: N/A;   COLONOSCOPY   04/07/2000   ELBOW SURGERY   03/15/2001    right   ESOPHAGOGASTRODUODENOSCOPY (EGD) WITH PROPOFOL N/A 09/07/2021    Procedure: ESOPHAGOGASTRODUODENOSCOPY (EGD) WITH PROPOFOL;  Surgeon: Irene Shipper, MD;  Location: [facility];  Service: Gastroenterology;  Laterality: N/A;   EXTRACORPOREAL SHOCK WAVE LITHOTRIPSY Left 07/17/2018    Procedure: EXTRACORPOREAL SHOCK WAVE LITHOTRIPSY (ESWL);  Surgeon: [name], MD;  Location: [facility];  Service: Urology;  Laterality: Left;   EXTRACORPOREAL SHOCK WAVE LITHOTRIPSY Right 12/08/2020    Procedure: RIGHT EXTRACORPOREAL SHOCK WAVE LITHOTRIPSY (ESWL);  Surgeon: Raynelle Bring, MD;  [facility];  Service: Urology;   Laterality: Right;   FOOT ARTHRODESIS Right 04/15/2021    Procedure: RIGHT SUBTALAR AND TALONAVICULAR FUSION;  Surgeon: Newt Minion, MD;  Location: [facility];  Service: Orthopedics;  Laterality: Right;   HERNIA REPAIR       IR MECH REMOV OBSTRUC MAT ANY COLON TUBE W/FLUORO   08/27/2021   IR REPLC GASTRO/COLONIC TUBE PERCUT W/FLUORO   09/08/2021   RIGHT/LEFT HEART CATH AND CORONARY ANGIOGRAPHY N/A 03/11/2022    Procedure: RIGHT/LEFT HEART CATH AND CORONARY ANGIOGRAPHY;  Surgeon: Jettie Booze, MD;  Location: [facility];  Service: Cardiovascular;  Laterality: N/A;   ROTATOR CUFF REPAIR       WHIPPLE PROCEDURE   07/13/2021      Assessment & Plan Clinical Impression: Patient is a 77 y.o. year old male who presented to [facility] emergency department on 03/05/2022 stating he awoke that morning with headache and vision disturbances.  On exam, the patient had a cranial nerve VII palsy and dilated pupil on the right and possible left-sided visual field deficit.  CTA of the head and neck performed.  Discussed with Dr. Leonel Barron and CT scan of the head in the emergency department demonstrated an acute/subacute right occipital stroke.  Out of window for tenecteplase.  Concern for cardioembolic source.     He was admitted to the hospitalist service.  He reports chronic right lower extremity weakness and foot drop.  Echocardiogram revealed EF of 30 to 35%.  This was significantly reduced as compared to previous echocardiogram in September 2023 with an EF of approximately 55 to 60%.  Cardiology was consulted on 12/24.  No acute ischemic changes on EKG.  Carvedilol 3.125 mg started twice daily.  Other goal-directed medical therapy adjusted over the next several days.  He was started on losartan 25 mg daily on 12/25.  He was started on aspirin 325 mg daily along with Plavix 75 mg for 3 months with recommendations to continue aspirin alone or after.  Symptoms improved although he still  complained of generalized weakness but no focal neurologic deficits. Lower extremity venous Doppler studies were negative for DVT.    On 12/26 he reported a cough and was diagnosed with influenza A.  Treated with Tamiflu.  Failed swallow eval and core track was placed 1/3.  Began tube feeds with protein supplements and continued on Creon for pancreatic insufficiency.  Started on Farxiga 10 mg daily by heart failure team.  Did not require diuretics.  Underwent CT scanning of the chest which revealed bilateral bronchopneumonia. CODE BLUE was called after the patient was found unresponsive and pulseless on around 0445 hrs. on 12/28.  ACLS protocol was underway with ROSC in approximately 2 minutes.  Monitor with ventricular tachycardia.  Placed on amiodarone drip and moved to the ICU with CCM consultation.  Underwent right and left heart cath.  Mild to moderate scattered atherosclerosis.  The left ventricular systolic function is normal.  No hemodynamically significant lesions.  Continue aggressive medical therapy. Started on spirinolactone.  Cardiac MRI performed and consistent with myocarditis and no amyloid involvement.  Electrophysiology consultation obtained.  Noted to have oral thrush on 12/30 and started on nystatin.  Entresto started  1/04 after improvement in serum creatinine. Losartan discontinued.  Barium swallow completed which showed concern for possible proximal esophageal stricture.  Continue dysphagia 2 diet, thin liquids, medications crushed pured.   Palliative care consultation obtained on 1/1.  Palliative care nurse practitioner, [name], discussed and reviewed CODE STATUS on 1/3.  "In the event of cardiac arrest (no pulse), family agrees to DNR status with no CPR, defibrillation, ACLS medications or intubation."   The patient requires inpatient physical medicine and rehabilitation evaluations and treatment secondary to dysfunction due to right PCA territory stroke, VT arrest, acute heart  failure, debility and failure to thrive. Patient transferred to CIR on 03/22/2022.  Team requested TR services during the remainder of time pt on CIR.  Met with pt today to discuss TR services including leisure education, activity analysis/modifications and stress management.  Also discussed the importance of social, emotional, spiritual health in addition to physical health and their effects on overall health and wellness.  Pt stated understanding.    Plan Will attempt to see daily prior to d/c 1/19  Recommendations for other services: None   Discharge Criteria: Patient will be discharged from TR if patient refuses treatment 3 consecutive times without medical reason.  If treatment goals not met, if there is a change in medical status, if patient makes no progress towards goals or if patient is discharged from hospital.  The above assessment, treatment plan, treatment alternatives and goals were discussed and mutually agreed upon: by patient  [place] 03/31/2022, 3:59 PM

## 2022-04-01 LAB — GLUCOSE, CAPILLARY: Glucose-Capillary: 119 mg/dL — ABNORMAL HIGH (ref 70–99)

## 2022-04-01 MED ORDER — PANCRELIPASE (LIP-PROT-AMYL) 36000-114000 UNITS PO CPEP: 72000.0000 [IU] | ORAL_CAPSULE | Freq: Three times a day (TID) | ORAL | Status: DC | PRN

## 2022-04-01 MED ORDER — PROCHLORPERAZINE EDISYLATE 10 MG/2ML IJ SOLN: 5.0000 mg | Freq: Four times a day (QID) | INTRAMUSCULAR | Status: DC | PRN

## 2022-04-01 MED ORDER — LIDOCAINE 5 % EX PTCH: 1.0000 | MEDICATED_PATCH | Freq: Every day | CUTANEOUS | 0 refills | Status: DC | PRN

## 2022-04-01 MED ORDER — SORBITOL 70 % SOLN: 30.0000 mL | Freq: Every day | Status: DC | PRN

## 2022-04-01 MED ORDER — TESTOSTERONE 50 MG/5GM (1%) TD GEL: 8.0000 g | Freq: Every day | TRANSDERMAL | Status: DC

## 2022-04-01 MED ORDER — AMIODARONE HCL 400 MG PO TABS: 400.0000 mg | ORAL_TABLET | Freq: Every day | ORAL | Status: DC

## 2022-04-01 MED ORDER — OXYCODONE HCL 5 MG/5ML PO SOLN: 5.0000 mg | ORAL | 0 refills | Status: DC | PRN

## 2022-04-01 MED ORDER — ALUM & MAG HYDROXIDE-SIMETH 200-200-20 MG/5ML PO SUSP: 30.0000 mL | ORAL | 0 refills | Status: DC | PRN

## 2022-04-01 MED ORDER — INSULIN GLARGINE-YFGN 100 UNIT/ML ~~LOC~~ SOLN: 15.0000 [IU] | Freq: Every day | SUBCUTANEOUS | 11 refills | Status: DC

## 2022-04-01 MED ORDER — PROCHLORPERAZINE 25 MG RE SUPP: 12.5000 mg | Freq: Four times a day (QID) | RECTAL | 0 refills | Status: DC | PRN

## 2022-04-01 MED ORDER — PROCHLORPERAZINE MALEATE 5 MG PO TABS: 5.0000 mg | ORAL_TABLET | Freq: Four times a day (QID) | ORAL | 0 refills | Status: DC | PRN

## 2022-04-01 MED ORDER — GUAIFENESIN-DM 100-10 MG/5ML PO SYRP: 5.0000 mL | ORAL_SOLUTION | Freq: Four times a day (QID) | ORAL | 0 refills | Status: DC | PRN

## 2022-04-01 MED ORDER — PHENOBARBITAL 20 MG/5ML PO ELIX: 20.0000 mg | ORAL_SOLUTION | Freq: Four times a day (QID) | ORAL | 0 refills | Status: DC | PRN

## 2022-04-01 MED ORDER — OXYCODONE HCL 20 MG/ML PO CONC: 10.0000 mg | ORAL | 0 refills | Status: DC | PRN

## 2022-04-01 MED ORDER — DIPHENHYDRAMINE HCL 12.5 MG/5ML PO ELIX: 12.5000 mg | ORAL_SOLUTION | Freq: Four times a day (QID) | ORAL | 0 refills | Status: DC | PRN

## 2022-04-01 MED ORDER — DICLOFENAC SODIUM 1 % EX GEL: 2.0000 g | Freq: Four times a day (QID) | CUTANEOUS | Status: DC | PRN

## 2022-04-01 MED ORDER — FLEET ENEMA 7-19 GM/118ML RE ENEM: 1.0000 | ENEMA | Freq: Once | RECTAL | 0 refills | Status: DC | PRN

## 2022-04-01 MED ORDER — ACETAMINOPHEN 160 MG/5ML PO SOLN: 650.0000 mg | ORAL | 0 refills | Status: DC | PRN

## 2022-04-01 MED ORDER — WHITE PETROLATUM EX OINT: 1.0000 | TOPICAL_OINTMENT | CUTANEOUS | 0 refills | Status: DC | PRN

## 2022-04-01 NOTE — Progress Notes (Signed)
William Barron 7X[address] [facility]) Hospice hospital liaison note   [facility] liaison continues to follow and communicate with family regarding plans for D/C to [facility].    Current plans for d/c Friday pending bed availability.   Thank you! [name], BSN, RN [phone]

## 2022-04-01 NOTE — Progress Notes (Signed)
Patient ID: William Barron, male   DOB: 1946/02/27, 77 y.o.   MRN: [id number]   Advanced Heart Failure Rounding Note  PCP-Cardiologist: Minus Breeding, MD   Subjective:    Patient now transitioned to full comfort care. Cor-trak out.   Awake but pleasantly confused,. Breathing comfortably.  Objective:   Weight Range: 52.8 kg Body mass index is 19.37 kg/m.   Vital Signs:   Temp:  [98 F (36.7 C)-99 F (37.2 C)] 98 F (36.7 C) (01/18 1935) Pulse Rate:  [68-69] 69 (01/18 1935) Resp:  [16-18] 16 (01/18 1935) BP: (103-110)/(49-57) 110/50 (01/18 1935) SpO2:  [84 %-99 %] 84 % (01/18 1935) Last BM Date : 04/01/22  Weight change: Filed Weights   03/28/22 0619 03/29/22 0443 03/30/22 0617  Weight: 53.5 kg 53.4 kg 52.8 kg    Intake/Output:   Intake/Output Summary (Last 24 hours) at 04/01/2022 2346 Last data filed at 04/01/2022 1850 Gross per 24 hour  Intake 660 ml  Output 350 ml  Net 310 ml       Physical Exam   General:  Weak appearing. No resp difficulty HEENT: normal Neck: supple. no JVD. Carotids 2+ bilat; no bruits. No lymphadenopathy or thryomegaly appreciated. Cor: PMI nondisplaced. Regular rate & rhythm. No rubs, gallops or murmurs. Lungs: clear Abdomen: soft, nontender, nondistended. No hepatosplenomegaly. No bruits or masses. Good bowel sounds. Extremities: no cyanosis, clubbing, rash, edema Neuro: alert but confused   Telemetry   Not on tele  Labs    CBC No results for input(s): "WBC", "NEUTROABS", "HGB", "HCT", "MCV", "PLT" in the last 72 hours.   Basic Metabolic Panel No results for input(s): "NA", "K", "CL", "CO2", "GLUCOSE", "BUN", "CREATININE", "CALCIUM", "MG", "PHOS" in the last 72 hours.  Liver Function Tests No results for input(s): "AST", "ALT", "ALKPHOS", "BILITOT", "PROT", "ALBUMIN" in the last 72 hours.  No results for input(s): "LIPASE", "AMYLASE" in the last 72 hours. Cardiac Enzymes No results for input(s): "CKTOTAL", "CKMB",  "CKMBINDEX", "TROPONINI" in the last 72 hours.  BNP: BNP (last 3 results) Recent Labs    03/07/22 1237  BNP 1,326.2*     ProBNP (last 3 results) No results for input(s): "PROBNP" in the last 8760 hours.   D-Dimer No results for input(s): "DDIMER" in the last 72 hours. Hemoglobin A1C No results for input(s): "HGBA1C" in the last 72 hours. Fasting Lipid Panel No results for input(s): "CHOL", "HDL", "LDLCALC", "TRIG", "CHOLHDL", "LDLDIRECT" in the last 72 hours. Thyroid Function Tests No results for input(s): "TSH", "T4TOTAL", "T3FREE", "THYROIDAB" in the last 72 hours.  Invalid input(s): "FREET3"  Other results:   Imaging    No results found.   Medications:     Scheduled Medications:  amiodarone  400 mg Oral Daily   diclofenac Sodium  2 g Topical QID   insulin glargine-yfgn  15 Units Subcutaneous Daily   lidocaine  1 patch Transdermal Q24H   testosterone  8 g Transdermal QHS    Infusions:    PRN Medications: oxyCODONE **AND** acetaminophen, alum & mag hydroxide-simeth, diphenhydrAMINE, guaiFENesin-dextromethorphan, lipase/protease/amylase, oxyCODONE, PHENObarbital, prochlorperazine **OR** prochlorperazine **OR** prochlorperazine, sodium phosphate, sorbitol, white petrolatum    Assessment/Plan   1. Acute on chronic systolic CHF:  2. CVA: Right occipital CVA with distal right PCA severe stenosis vs occlusion.  3. VT arrest:  4. CAD:  5. Multiple myeloma with AL amyloidosis isolated to lymph nodes:  6. Prostate cancer: Treated with radiation in 2022.  7. Right eye diplopia: Chronic, 6th nerve palsy.  8. H/o  Whipple procedure for IPMN in 5/23.  9. Influenza A: Has been treated with 5 days oseltamivir this admission.  10. Frailty/failure to thrive: Did not recover well at all post-Whipple in 5/23 11. DNR/DNI  He is end-stage. Has transitioned to comfort care  He is comfortable. Can stop amiodarone. Plan to transfer to [facility] with Palliative services  tomorrow.    Length of Stay: [place], MD   Advanced Heart Failure Team Pager [phone] (M-F; 7a - 5p)  Please contact [facility] for night-coverage after hours (5p -7a ) and weekends on [email or web address]

## 2022-04-01 NOTE — Progress Notes (Signed)
Recreational Therapy Session Note  Patient Details  Name: William Barron MRN: [id number] Date of Birth: 1946-02-17 Today's Date: 04/01/2022  Pain: no c/o Skilled Therapeutic Interventions/Progress Updates: Pt resting in bed upon arrival.  Met with pt at bedside today, emotioanl support provided as he was discussing upcoming discharge.  Pt transitioned to discussing how much he enjoyed his lunch yesterday that his wife had brought in.  He is so glad to be eating more of what he wants.  Pt continued discussion sharing his favorite soups and local restaurants he frequented.  Pt requesting assistance with shaving and showering, nursing made aware.  [place] 04/01/2022, 3:22 PM

## 2022-04-01 NOTE — Progress Notes (Signed)
Patient refused Pacerone this morning. PA Notified. No new orders.    Yehuda Mao, LPN

## 2022-04-01 NOTE — Progress Notes (Signed)
PROGRESS NOTE    Subjective/Complaints:  Pt in good spirits. Pleased with breakfast this am. Says he had corn flakes with banana, vanilla pudding, grits.   ROS: Patient denies fever, rash, sore throat, blurred vision, dizziness, nausea, vomiting, diarrhea, cough, shortness of breath or chest pain, joint or back/neck pain, headache, or mood change.   Objective:   No results found. Recent Labs    03/29/22 1035  WBC 7.8  HGB 9.3*  HCT 30.6*  PLT 426*   No results for input(s): "NA", "K", "CL", "CO2", "GLUCOSE", "BUN", "CREATININE", "CALCIUM" in the last 72 hours.    Intake/Output Summary (Last 24 hours) at 04/01/2022 0844 Last data filed at 04/01/2022 0800 Gross per 24 hour  Intake 480 ml  Output 100 ml  Net 380 ml        Physical Exam: Vital Signs Blood pressure (!) 103/49, pulse 68, temperature 99 F (37.2 C), resp. rate 16, height '5\' 5"'$  (1.651 m), weight 52.8 kg, SpO2 98 %.   Constitutional: No distress . Vital signs reviewed. HEENT: NCAT, EOMI, oral membranes moist Neck: supple Cardiovascular: RRR without murmur. No JVD    Respiratory/Chest: CTA Bilaterally without wheezes or rales. Normal effort    GI/Abdomen: BS +, non-tender, non-distended Ext: no clubbing, cyanosis, or edema Psych: pleasant and cooperative  Skin: scattered bruises Neuro:  alert, oriented to person, place.  Fair awareness. Follows commands. STM deficits. Speech dysarthric but improving. Right CN VI palsy. RUE 4+, LUE 4/5. RLE 4/5 prox with foot drop distally. LLE 4/5.  --no change Musculoskeletal: no pain with AROm/PROM    Assessment/Plan: 1. Functional deficits which require 3+ hours per day of interdisciplinary therapy in a comprehensive inpatient rehab setting. Physiatrist is providing close team supervision and 24 hour management of active medical problems listed below. Physiatrist and rehab team continue to assess barriers to  discharge/monitor patient progress toward functional and medical goals  Care Tool:  Bathing    Body parts bathed by patient: Right arm, Left arm, Chest, Abdomen, Front perineal area, Right upper leg, Left upper leg, Face   Body parts bathed by helper: Buttocks, Right lower leg, Left lower leg     Bathing assist Assist Level: Moderate Assistance - Patient 50 - 74%     Upper Body Dressing/Undressing Upper body dressing   What is the patient wearing?: Pull over shirt    Upper body assist Assist Level: Supervision/Verbal cueing    Lower Body Dressing/Undressing Lower body dressing      What is the patient wearing?: Pants     Lower body assist Assist for lower body dressing: Maximal Assistance - Patient 25 - 49%     Toileting Toileting    Toileting assist Assist for toileting: Minimal Assistance - Patient > 75%     Transfers Chair/bed transfer  Transfers assist     Chair/bed transfer assist level: Minimal Assistance - Patient > 75% Chair/bed transfer assistive device: Programmer, multimedia   Ambulation assist      Assist level: Moderate Assistance - Patient 50 - 74% Assistive device: Walker-rolling Max distance: 15'   Walk 10 feet activity   Assist     Assist  level: Moderate Assistance - Patient - 50 - 74% Assistive device: Walker-rolling   Walk 50 feet activity   Assist Walk 50 feet with 2 turns activity did not occur: Safety/medical concerns         Walk 150 feet activity   Assist Walk 150 feet activity did not occur: Safety/medical concerns         Walk 10 feet on uneven surface  activity   Assist Walk 10 feet on uneven surfaces activity did not occur: Safety/medical concerns         Wheelchair     Assist Is the patient using a wheelchair?: No Type of Wheelchair: Manual    Wheelchair assist level: Supervision/Verbal cueing Max wheelchair distance: 150 ft    Wheelchair 50 feet with 2 turns  activity    Assist        Assist Level: Supervision/Verbal cueing   Wheelchair 150 feet activity     Assist      Assist Level: Supervision/Verbal cueing   Blood pressure (!) 103/49, pulse 68, temperature 99 F (37.2 C), resp. rate 16, height '5\' 5"'$  (1.651 m), weight 52.8 kg, SpO2 98 %.  Medical Problem List and Plan: 1. Functional deficits secondary to right occipital infarct d/t right PCA stenosis/occlusion, multiple medical conditions             -patient is now LTC/hospice. Placement pending for Friday  -appreciate RT help with mobilizing patient a bit while he's here 2.  Antithrombotics: -DVT/anticoagulation:  Pharmaceutical: Heparin             -antiplatelet therapy: Aspirin 325 mg daily plus Plavix 75 mg daily for 3 months then aspirin alone   3. Pain Management: Tylenol, Lidoderm as needed             -Voltaren gel   -reports pain is controlled 4. Mood/Behavior/Sleep: LCSW to evaluate and provide emotional support             -antipsychotic agents: n/a   5. Neuropsych/cognition: This patient is capable of making decisions on his own behalf.   6. Skin/Wound Care: Routine skin care checks   7. Fluids/Electrolytes/Nutrition:                  -Dysphagia 2 diet, thin liquids              -intake actually very good over the last 24+ hours!  8: VT arrest: Likely due to prior MI versus myocarditis status post EP eval             -follow-up with cardiology (meds as per #10)             - no plan now for life vest/ICD 9: CAD status post left heart catheterization             -Continue aspirin and statin   10: Acute on chronic systolic heart failure:               - Entresto 24-26 mg BID stopped.             -Continue dapagliflozin 10 mg daily stopped             -  spironolactone 12.5 mg daily stopped             -continue amiodarone 400 mg BID             -  carvedilol 3.125 mg BID stopped    11: Cardiomyopathy: Likely due to myocarditis  versus prior MI    12: Bilateral pneumonia; likely aspiration pneumonitis             -completed antibiotics             -CT chest 12/30: R>L bronchopneumonia, chronic sarcoidosis   13: Intraductal papillary mucinous neoplasms of the pancreas status post Whipple procedure 2023             -Continue pancreatic enzymes             -Follow-up with [name]   14: Multiple myeloma with AL amyloidosis: stable;    15: Influenza A: Status post oseltamivir x 5 days this admission   16: DM:     1/12 Semglee reduced back to 15u daily    -DC'ED CBG CHECKS  17. Low tesosterone: resumed androgel per familly request  25: Code status: DNR as above  26. Gross blood in stool:  -no further monitoring    LOS: 10 days A FACE TO FACE EVALUATION WAS PERFORMED  Meredith Staggers 04/01/2022, 8:44 AM

## 2022-04-01 NOTE — Discharge Instructions (Signed)
Inpatient Rehab Discharge Instructions  William Barron Discharge date and time: 04/02/2022   Activities/Precautions/ Functional Status: Activity: no lifting, driving, or strenuous exercise until cleared by MD Diet:  dysphagia 2 diet with thin liquids Wound Care: none needed Functional status:  ___ No restrictions     ___ Walk up steps independently _x__ 24/7 supervision/assistance   ___ Walk up steps with assistance ___ Intermittent supervision/assistance  ___ Bathe/dress independently ___ Walk with walker     ___ Bathe/dress with assistance ___ Walk Independently    ___ Shower independently ___ Walk with assistance    __x_ Shower with assistance _x__ No alcohol     ___ Return to work/school ________  No driving, alcohol consumption or tobacco use.  My questions have been answered and I understand these instructions. I will adhere to these goals and the provided educational materials after my discharge from the hospital.  Patient/Caregiver Signature _______________________________ Date __________  Clinician Signature _______________________________________ Date __________  Please bring this form and your medication list with you to all your follow-up doctor's appointments.

## 2022-04-01 NOTE — Progress Notes (Addendum)
Patient ID: William Barron, male   DOB: August 29, 1945, 77 y.o.   MRN: [id number]  932-SW received phone call from Deana-NP/Hospice and Palliative Care Coordinator for [facility] ([phone]) reporting she did not see an indication for hospice. She confirms he has LTC contract for [facility] but again not for hospice as he has to be evaluated and approved by [place].   SW faxed over updated clinicals.   SW informed medical team on above.   1152-SW left message for Deana-NP/Hospice and Palliative Care Coordinator for [facility] ([phone]) to confirm if clinicals have been received, and inform that [facility] will also go into assess at [facility] as well. SW waiting on follow-up.   1421-SW left message for Peggy/LTC liaison with [name] [phone]) to inquire if pt moved out and if pt can move in as planned for tomorrow. SW akso informed on above details with regard to hospice. SW waiting on follow-up.   50- Sw received return all from Peggy/LTC liaison with [name] [phone]) who confirmed pt can move in as planned tomorrow. SW explained above about [place] will need to approve if hospice appropriate. Will provide care needs pt will have.   57- SW made efforts to call pt wife [name] but unable to reach. SW used work cell to call pt wife and left message about transition.   1500- SW received return phone call from pt wife [name] to discuss above. Reports she will be here early tomorrow to help him pack up.   Loralee Pacas, MSW, [place] Office: [phone] Cell: [phone] Fax: [phone]

## 2022-04-02 DIAGNOSIS — E349 Endocrine disorder, unspecified: Secondary | ICD-10-CM

## 2022-04-02 LAB — GLUCOSE, CAPILLARY: Glucose-Capillary: 103 mg/dL — ABNORMAL HIGH (ref 70–99)

## 2022-04-02 NOTE — Progress Notes (Signed)
PROGRESS NOTE    Subjective/Complaints:  Pt feeling well. Ate food on breakfast tray today.   ROS: Patient denies fever, rash, sore throat, blurred vision, dizziness, nausea, vomiting, diarrhea, cough, shortness of breath or chest pain, joint or back/neck pain, headache, or mood change.    Objective:   No results found. No results for input(s): "WBC", "HGB", "HCT", "PLT" in the last 72 hours.  No results for input(s): "NA", "K", "CL", "CO2", "GLUCOSE", "BUN", "CREATININE", "CALCIUM" in the last 72 hours.    Intake/Output Summary (Last 24 hours) at 04/02/2022 1010 Last data filed at 04/02/2022 0852 Gross per 24 hour  Intake 1320 ml  Output 350 ml  Net 970 ml        Physical Exam: Vital Signs Blood pressure (!) 138/50, pulse 73, temperature 99 F (37.2 C), temperature source Oral, resp. rate 16, height '5\' 5"'$  (1.651 m), weight 52.8 kg, SpO2 97 %.   Constitutional: No distress . Vital signs reviewed. HEENT: NCAT, EOMI, oral membranes moist Neck: supple Cardiovascular: RRR without murmur. No JVD    Respiratory/Chest: CTA Bilaterally without wheezes or rales. Normal effort    GI/Abdomen: BS +, non-tender, non-distended Ext: no clubbing, cyanosis, or edema Psych: pleasant and cooperative  Skin: a few bruises on UE's Neuro:  alert, oriented to person, place.  Fair awareness. Follows commands. STM deficits. Speech dysarthric but improving. Right CN VI palsy. RUE 4+, LUE 4/5. RLE 4/5 prox with foot drop distally. LLE 4/5.  -no neuro changes Musculoskeletal: no pain with AROm/PROM    Assessment/Plan: 1. Functional deficits which require 3+ hours per day of interdisciplinary therapy in a comprehensive inpatient rehab setting. Physiatrist is providing close team supervision and 24 hour management of active medical problems listed below. Physiatrist and rehab team continue to assess barriers to discharge/monitor patient  progress toward functional and medical goals  Care Tool:  Bathing    Body parts bathed by patient: Right arm, Left arm, Chest, Abdomen, Front perineal area, Right upper leg, Left upper leg, Face   Body parts bathed by helper: Buttocks, Right lower leg, Left lower leg     Bathing assist Assist Level: Moderate Assistance - Patient 50 - 74%     Upper Body Dressing/Undressing Upper body dressing   What is the patient wearing?: Pull over shirt    Upper body assist Assist Level: Supervision/Verbal cueing    Lower Body Dressing/Undressing Lower body dressing      What is the patient wearing?: Pants     Lower body assist Assist for lower body dressing: Maximal Assistance - Patient 25 - 49%     Toileting Toileting    Toileting assist Assist for toileting: Minimal Assistance - Patient > 75%     Transfers Chair/bed transfer  Transfers assist     Chair/bed transfer assist level: Minimal Assistance - Patient > 75% Chair/bed transfer assistive device: Programmer, multimedia   Ambulation assist      Assist level: Moderate Assistance - Patient 50 - 74% Assistive device: Walker-rolling Max distance: 15'   Walk 10 feet activity   Assist     Assist level: Moderate Assistance - Patient - 50 - 74%  Assistive device: Walker-rolling   Walk 50 feet activity   Assist Walk 50 feet with 2 turns activity did not occur: Safety/medical concerns         Walk 150 feet activity   Assist Walk 150 feet activity did not occur: Safety/medical concerns         Walk 10 feet on uneven surface  activity   Assist Walk 10 feet on uneven surfaces activity did not occur: Safety/medical concerns         Wheelchair     Assist Is the patient using a wheelchair?: No Type of Wheelchair: Manual    Wheelchair assist level: Supervision/Verbal cueing Max wheelchair distance: 150 ft    Wheelchair 50 feet with 2 turns activity    Assist         Assist Level: Supervision/Verbal cueing   Wheelchair 150 feet activity     Assist      Assist Level: Supervision/Verbal cueing   Blood pressure (!) 138/50, pulse 73, temperature 99 F (37.2 C), temperature source Oral, resp. rate 16, height '5\' 5"'$  (1.651 m), weight 52.8 kg, SpO2 97 %.  Medical Problem List and Plan: 1. Functional deficits secondary to right occipital infarct d/t right PCA stenosis/occlusion, multiple medical conditions             -patient is now LTC/hospice. Placement pending today  -appreciate RT help with mobilizing patient a bit while he's here   -he will not need f/u with me.    3. Pain Management: Tylenol, Lidoderm as needed             -Voltaren gel   -reports pain is controlled 4. Mood/Behavior/Sleep: LCSW to evaluate and provide emotional support             -antipsychotic agents: n/a   5. Neuropsych/cognition: This patient is capable of making decisions on his own behalf.   6. Skin/Wound Care: Routine skin care checks   7. Fluids/Electrolytes/Nutrition:                  -Dysphagia 2 diet, thin liquids              -intake has improved over last 2 days  8: VT arrest: Likely due to prior MI versus myocarditis   9: CAD status post left heart catheterization             -Continue aspirin and statin   10: Acute on chronic systolic heart failure:               - Entresto 24-26 mg BID stopped.             -Continue dapagliflozin 10 mg daily stopped             -  spironolactone 12.5 mg daily stopped             -continue amiodarone 400 mg BID             -  carvedilol 3.125 mg BID stopped    11: Cardiomyopathy: Likely due to myocarditis versus prior MI   12: Bilateral pneumonia; likely aspiration pneumonitis             -completed antibiotics             -CT chest 12/30: R>L bronchopneumonia, chronic sarcoidosis   13: Intraductal papillary mucinous neoplasms of the pancreas status post Whipple procedure 2023             -  14:  Multiple myeloma with AL amyloidosis: stable;    15: Influenza A: Status post oseltamivir x 5 days this admission   16: DM:     1/12 Semglee reduced back to 15u daily    -DC'ED CBG CHECKS  17. Low tesosterone: resumed androgel per familly request  25: Code status: DNR as above  26. Gross blood in stool:  -no further monitoring    LOS: 11 days A FACE TO FACE EVALUATION WAS PERFORMED  Meredith Staggers 04/02/2022, 10:10 AM

## 2022-04-02 NOTE — Progress Notes (Signed)
Recreational Therapy Discharge Summary Patient Details  Name: William Barron MRN: [id number] Date of Birth: 1945/03/23 Today's Date: 04/02/2022  Comments on progress toward goals: Pt with scheduled discharge to [facility] today with hospice care.  TR sessions focused on psychosocial support over the last couple days leading up to discharge.  Pt smiling, interactive and easily engaged in conversations about his leisure interests & social support system.  Pt had specific emphasis on his meals lately as he was previously on a more restricted diet.  Pt enjoys sharing his experiences and personal preferences.   Reasons for discharge: discharge from hospital  Follow-up:  Hospice/Palliative Care  Patient/family agrees with progress made and goals achieved: Yes  [name] 04/02/2022, 12:11 PM

## 2022-04-02 NOTE — Progress Notes (Signed)
Inpatient Rehabilitation Discharge Medication Review by a Pharmacist  A complete drug regimen review was completed for this patient to identify any potential clinically significant medication issues.  High Risk Drug Classes Is patient taking? Indication by Medication  Antipsychotic Yes, as an intravenous medication Prochlorperazine - PRN nausea/vomiting  Anticoagulant No   Antibiotic No   Opioid Yes Oxycodone - PRN pain  Antiplatelet No   Hypoglycemics/insulin Yes Insulin glargine - DM  Vasoactive Medication Yes Amiodarone - hx VT arrest  Chemotherapy No   Other Yes Creon - pancreatic enzymes     Type of Medication Issue Identified Description of Issue Recommendation(s)  Drug Interaction(s) (clinically significant)     Duplicate Therapy     Allergy     No Medication Administration End Date     Incorrect Dose     Additional Drug Therapy Needed     Significant med changes from prior encounter (inform family/care partners about these prior to discharge). Patient transitioning to comfort care, all non-critical home medications were stopped to reduce pill burden Communicate medication changes with patient/family at discharge   Other      Clinically significant medication issues were identified that warrant physician communication and completion of prescribed/recommended actions by midnight of the next day:  No   Pharmacist comments: n/a   Time spent performing this drug regimen review (minutes): 20   Thank you for allowing pharmacy to be a part of this patient's care.  Ardyth Harps, PharmD Clinical Pharmacist

## 2022-04-02 NOTE — Progress Notes (Addendum)
Patient ID: William Barron, male   DOB: 03-Aug-1945, 77 y.o.   MRN: [id number]  SW updated [name] on pt transition today.   SW updated pt assigned RN on transition today.   Continue to wait on follow-up from [facility].   D/c to William Barron- RM#[id number]; Nurse Report [phone].   11:30am- SW received phone call from William Barron with [facility] asking for d/c summary.SW informed will send once completed, and will follow-up.   1221-SW left message for [employer] informing d.c summary has been uploaded into [employer].   William Barron, MSW, [place] Office: [phone] Cell: [phone] Fax: [phone]

## 2022-04-02 NOTE — Progress Notes (Signed)
Inpatient Rehabilitation Care Coordinator Discharge Note   Patient Details  Name: William Barron MRN: [id number] Date of Birth: Dec 18, 1945   Discharge location: D/c to SNF-LTC [name]; hospice pending [facility] approval  Length of Stay: 10 days  Discharge activity level: Max Asst  Home/community participation: Limited  Patient response IR:JJOACZ Literacy - How often do you need to have someone help you when you read instructions, pamphlets, or other written material from your doctor or pharmacy?: Never  Patient response YS:AYTKZS Isolation - How often do you feel lonely or isolated from those around you?: Never  Services provided included: MD, RD, PT, OT, SLP, RN, CM, Pharmacy, Neuropsych, SW, TR  Financial Services:  Financial Services Utilized: Medicare [place] (primary insurance for this admission)  Choices offered to/list presented to: pt and his wife William Barron  Follow-up services arranged:  Other (Comment) (SNF)      Patient response to transportation need: Is the patient able to respond to transportation needs?: Yes In the past 12 months, has lack of transportation kept you from medical appointments or from getting medications?: No In the past 12 months, has lack of transportation kept you from meetings, work, or from getting things needed for daily living?: No    Comments (or additional information):  Patient/Family verbalized understanding of follow-up arrangements:  Yes  Individual responsible for coordination of the follow-up plan: contact [name] #[phone]  Confirmed correct DME delivered: William Barron 04/02/2022    William Barron

## 2022-04-02 NOTE — Plan of Care (Addendum)
Patient transitioned to Hospice and unable to complete program. Closed nursing education and nursing care plan

## 2022-04-05 ENCOUNTER — Ambulatory Visit: Payer: Medicare Other | Admitting: General Practice

## 2022-04-05 ENCOUNTER — Ambulatory Visit: Payer: Medicare Other | Admitting: Orthopedic Surgery

## 2022-04-09 ENCOUNTER — Inpatient Hospital Stay (HOSPITAL_COMMUNITY): Payer: Medicare Other

## 2022-05-14 DEATH — deceased

## 2022-05-21 ENCOUNTER — Other Ambulatory Visit (HOSPITAL_COMMUNITY): Payer: Medicare Other

## 2022-05-28 ENCOUNTER — Ambulatory Visit: Payer: Medicare Other | Admitting: Cardiology

## 2023-02-02 NOTE — Telephone Encounter (Signed)
Telephone call
